# Patient Record
Sex: Female | Born: 1935 | Race: White | Hispanic: No | Smoking: Former smoker
Health system: Southern US, Community
[De-identification: ages and names within clinical notes are randomized; demographics above are authoritative.]

## PROBLEM LIST (undated history)

## (undated) DIAGNOSIS — M109 Gout, unspecified: Secondary | ICD-10-CM

## (undated) DIAGNOSIS — Z9981 Dependence on supplemental oxygen: Secondary | ICD-10-CM

## (undated) DIAGNOSIS — E871 Hypo-osmolality and hyponatremia: Secondary | ICD-10-CM

## (undated) DIAGNOSIS — G309 Alzheimer's disease, unspecified: Secondary | ICD-10-CM

## (undated) DIAGNOSIS — G459 Transient cerebral ischemic attack, unspecified: Secondary | ICD-10-CM

## (undated) DIAGNOSIS — I509 Heart failure, unspecified: Secondary | ICD-10-CM

## (undated) DIAGNOSIS — E119 Type 2 diabetes mellitus without complications: Secondary | ICD-10-CM

## (undated) DIAGNOSIS — I482 Chronic atrial fibrillation, unspecified: Secondary | ICD-10-CM

## (undated) DIAGNOSIS — F0390 Unspecified dementia without behavioral disturbance: Secondary | ICD-10-CM

## (undated) DIAGNOSIS — E7800 Pure hypercholesterolemia, unspecified: Secondary | ICD-10-CM

## (undated) DIAGNOSIS — D649 Anemia, unspecified: Secondary | ICD-10-CM

## (undated) DIAGNOSIS — Z8719 Personal history of other diseases of the digestive system: Secondary | ICD-10-CM

## (undated) DIAGNOSIS — Z8701 Personal history of pneumonia (recurrent): Secondary | ICD-10-CM

## (undated) DIAGNOSIS — I422 Other hypertrophic cardiomyopathy: Secondary | ICD-10-CM

## (undated) DIAGNOSIS — F329 Major depressive disorder, single episode, unspecified: Secondary | ICD-10-CM

## (undated) DIAGNOSIS — I10 Essential (primary) hypertension: Secondary | ICD-10-CM

## (undated) DIAGNOSIS — M533 Sacrococcygeal disorders, not elsewhere classified: Secondary | ICD-10-CM

## (undated) DIAGNOSIS — F419 Anxiety disorder, unspecified: Secondary | ICD-10-CM

## (undated) DIAGNOSIS — I4891 Unspecified atrial fibrillation: Secondary | ICD-10-CM

## (undated) DIAGNOSIS — I8390 Asymptomatic varicose veins of unspecified lower extremity: Secondary | ICD-10-CM

## (undated) DIAGNOSIS — J449 Chronic obstructive pulmonary disease, unspecified: Secondary | ICD-10-CM

## (undated) DIAGNOSIS — K219 Gastro-esophageal reflux disease without esophagitis: Secondary | ICD-10-CM

## (undated) DIAGNOSIS — I272 Pulmonary hypertension, unspecified: Secondary | ICD-10-CM

## (undated) DIAGNOSIS — F0280 Dementia in other diseases classified elsewhere without behavioral disturbance: Secondary | ICD-10-CM

## (undated) DIAGNOSIS — I69320 Aphasia following cerebral infarction: Secondary | ICD-10-CM

## (undated) DIAGNOSIS — M1990 Unspecified osteoarthritis, unspecified site: Secondary | ICD-10-CM

## (undated) DIAGNOSIS — J410 Simple chronic bronchitis: Secondary | ICD-10-CM

## (undated) DIAGNOSIS — Z950 Presence of cardiac pacemaker: Secondary | ICD-10-CM

## (undated) DIAGNOSIS — R296 Repeated falls: Secondary | ICD-10-CM

## (undated) DIAGNOSIS — F32A Depression, unspecified: Secondary | ICD-10-CM

## (undated) DIAGNOSIS — R079 Chest pain, unspecified: Secondary | ICD-10-CM

## (undated) DIAGNOSIS — F4323 Adjustment disorder with mixed anxiety and depressed mood: Secondary | ICD-10-CM

## (undated) HISTORY — DX: Repeated falls: R29.6

## (undated) HISTORY — DX: Chronic atrial fibrillation, unspecified: I48.20

## (undated) HISTORY — DX: Aphasia following cerebral infarction: I69.320

## (undated) HISTORY — DX: Unspecified atrial fibrillation: I48.91

## (undated) HISTORY — DX: Presence of cardiac pacemaker: Z95.0

## (undated) HISTORY — PX: FOOT NEUROMA SURGERY: SHX646

## (undated) HISTORY — PX: CARDIAC CATHETERIZATION: SHX172

## (undated) HISTORY — PX: BACK SURGERY: SHX140

## (undated) HISTORY — PX: TONSILLECTOMY: SUR1361

## (undated) HISTORY — DX: Essential (primary) hypertension: I10

## (undated) HISTORY — DX: Alzheimer's disease, unspecified: G30.9

## (undated) HISTORY — DX: Unspecified osteoarthritis, unspecified site: M19.90

## (undated) HISTORY — DX: Personal history of pneumonia (recurrent): Z87.01

## (undated) HISTORY — PX: APPENDECTOMY: SHX54

## (undated) HISTORY — DX: Dependence on supplemental oxygen: Z99.81

## (undated) HISTORY — DX: Pure hypercholesterolemia, unspecified: E78.00

## (undated) HISTORY — DX: Dementia in other diseases classified elsewhere without behavioral disturbance: F02.80

## (undated) HISTORY — PX: INSERT / REPLACE / REMOVE PACEMAKER: SUR710

## (undated) HISTORY — PX: DILATION AND CURETTAGE OF UTERUS: SHX78

## (undated) HISTORY — DX: Adjustment disorder with mixed anxiety and depressed mood: F43.23

## (undated) HISTORY — DX: Pulmonary hypertension, unspecified: I27.20

## (undated) HISTORY — DX: Other hypertrophic cardiomyopathy: I42.2

## (undated) HISTORY — DX: Type 2 diabetes mellitus without complications: E11.9

## (undated) HISTORY — DX: Chest pain, unspecified: R07.9

## (undated) HISTORY — PX: ABDOMINAL HYSTERECTOMY: SHX81

## (undated) HISTORY — DX: Simple chronic bronchitis: J41.0

---

## 1998-08-02 ENCOUNTER — Ambulatory Visit (HOSPITAL_COMMUNITY): Admission: RE | Admit: 1998-08-02 | Discharge: 1998-08-02 | Payer: Self-pay | Admitting: Nurse Practitioner

## 1999-12-18 ENCOUNTER — Encounter: Admission: RE | Admit: 1999-12-18 | Discharge: 1999-12-18 | Payer: Self-pay | Admitting: Gynecology

## 1999-12-18 ENCOUNTER — Encounter: Payer: Self-pay | Admitting: Gynecology

## 2000-07-21 ENCOUNTER — Encounter: Admission: RE | Admit: 2000-07-21 | Discharge: 2000-07-21 | Payer: Self-pay | Admitting: Family Medicine

## 2000-07-21 ENCOUNTER — Encounter: Payer: Self-pay | Admitting: Family Medicine

## 2000-12-08 ENCOUNTER — Other Ambulatory Visit: Admission: RE | Admit: 2000-12-08 | Discharge: 2000-12-08 | Payer: Self-pay | Admitting: Gynecology

## 2000-12-25 ENCOUNTER — Encounter: Admission: RE | Admit: 2000-12-25 | Discharge: 2000-12-25 | Payer: Self-pay | Admitting: Family Medicine

## 2000-12-25 ENCOUNTER — Encounter: Payer: Self-pay | Admitting: Family Medicine

## 2001-02-14 ENCOUNTER — Emergency Department (HOSPITAL_COMMUNITY): Admission: EM | Admit: 2001-02-14 | Discharge: 2001-02-14 | Payer: Self-pay | Admitting: Emergency Medicine

## 2001-02-14 ENCOUNTER — Encounter: Payer: Self-pay | Admitting: Emergency Medicine

## 2001-11-17 ENCOUNTER — Encounter: Payer: Self-pay | Admitting: Vascular Surgery

## 2001-11-18 ENCOUNTER — Ambulatory Visit (HOSPITAL_COMMUNITY): Admission: RE | Admit: 2001-11-18 | Discharge: 2001-11-18 | Payer: Self-pay | Admitting: Vascular Surgery

## 2001-12-02 ENCOUNTER — Ambulatory Visit (HOSPITAL_COMMUNITY): Admission: RE | Admit: 2001-12-02 | Discharge: 2001-12-02 | Payer: Self-pay | Admitting: Vascular Surgery

## 2001-12-08 ENCOUNTER — Ambulatory Visit (HOSPITAL_COMMUNITY): Admission: RE | Admit: 2001-12-08 | Discharge: 2001-12-08 | Payer: Self-pay | Admitting: Vascular Surgery

## 2002-02-15 ENCOUNTER — Ambulatory Visit (HOSPITAL_COMMUNITY): Admission: RE | Admit: 2002-02-15 | Discharge: 2002-02-15 | Payer: Self-pay | Admitting: Gastroenterology

## 2002-02-24 ENCOUNTER — Encounter: Payer: Self-pay | Admitting: Gynecology

## 2002-02-24 ENCOUNTER — Encounter: Admission: RE | Admit: 2002-02-24 | Discharge: 2002-02-24 | Payer: Self-pay | Admitting: Gynecology

## 2002-03-18 ENCOUNTER — Encounter: Payer: Self-pay | Admitting: Gastroenterology

## 2002-03-18 ENCOUNTER — Encounter: Admission: RE | Admit: 2002-03-18 | Discharge: 2002-03-18 | Payer: Self-pay | Admitting: Gastroenterology

## 2002-05-12 ENCOUNTER — Encounter: Admission: RE | Admit: 2002-05-12 | Discharge: 2002-05-12 | Payer: Self-pay | Admitting: Family Medicine

## 2002-05-12 ENCOUNTER — Encounter: Payer: Self-pay | Admitting: Family Medicine

## 2002-07-06 ENCOUNTER — Encounter: Admission: RE | Admit: 2002-07-06 | Discharge: 2002-07-06 | Payer: Self-pay | Admitting: Orthopedic Surgery

## 2002-07-06 ENCOUNTER — Encounter: Payer: Self-pay | Admitting: Orthopedic Surgery

## 2002-10-05 ENCOUNTER — Encounter: Payer: Self-pay | Admitting: Family Medicine

## 2002-10-05 ENCOUNTER — Encounter: Admission: RE | Admit: 2002-10-05 | Discharge: 2002-10-05 | Payer: Self-pay | Admitting: Family Medicine

## 2003-02-15 ENCOUNTER — Encounter (INDEPENDENT_AMBULATORY_CARE_PROVIDER_SITE_OTHER): Payer: Self-pay | Admitting: Specialist

## 2003-02-15 ENCOUNTER — Ambulatory Visit (HOSPITAL_COMMUNITY): Admission: RE | Admit: 2003-02-15 | Discharge: 2003-02-15 | Payer: Self-pay | Admitting: Gastroenterology

## 2003-03-02 ENCOUNTER — Encounter: Payer: Self-pay | Admitting: Gynecology

## 2003-03-02 ENCOUNTER — Other Ambulatory Visit: Admission: RE | Admit: 2003-03-02 | Discharge: 2003-03-02 | Payer: Self-pay | Admitting: Gynecology

## 2003-03-02 ENCOUNTER — Encounter: Admission: RE | Admit: 2003-03-02 | Discharge: 2003-03-02 | Payer: Self-pay | Admitting: Gynecology

## 2003-07-17 ENCOUNTER — Emergency Department (HOSPITAL_COMMUNITY): Admission: EM | Admit: 2003-07-17 | Discharge: 2003-07-17 | Payer: Self-pay | Admitting: Emergency Medicine

## 2003-07-17 ENCOUNTER — Encounter: Payer: Self-pay | Admitting: Emergency Medicine

## 2003-08-10 ENCOUNTER — Encounter: Payer: Self-pay | Admitting: Thoracic Surgery

## 2003-08-11 ENCOUNTER — Encounter (INDEPENDENT_AMBULATORY_CARE_PROVIDER_SITE_OTHER): Payer: Self-pay | Admitting: *Deleted

## 2003-08-11 ENCOUNTER — Ambulatory Visit (HOSPITAL_COMMUNITY): Admission: RE | Admit: 2003-08-11 | Discharge: 2003-08-11 | Payer: Self-pay | Admitting: Thoracic Surgery

## 2003-08-25 ENCOUNTER — Inpatient Hospital Stay (HOSPITAL_COMMUNITY): Admission: EM | Admit: 2003-08-25 | Discharge: 2003-08-28 | Payer: Self-pay | Admitting: Emergency Medicine

## 2003-08-25 ENCOUNTER — Encounter: Payer: Self-pay | Admitting: Emergency Medicine

## 2003-08-26 ENCOUNTER — Encounter: Payer: Self-pay | Admitting: Neurology

## 2003-09-07 ENCOUNTER — Encounter: Admission: RE | Admit: 2003-09-07 | Discharge: 2003-09-07 | Payer: Self-pay | Admitting: Thoracic Surgery

## 2003-10-07 ENCOUNTER — Emergency Department (HOSPITAL_COMMUNITY): Admission: EM | Admit: 2003-10-07 | Discharge: 2003-10-08 | Payer: Self-pay | Admitting: Emergency Medicine

## 2003-10-20 ENCOUNTER — Encounter: Admission: RE | Admit: 2003-10-20 | Discharge: 2003-10-20 | Payer: Self-pay | Admitting: Thoracic Surgery

## 2004-03-05 ENCOUNTER — Encounter: Admission: RE | Admit: 2004-03-05 | Discharge: 2004-03-05 | Payer: Self-pay | Admitting: Gynecology

## 2004-03-05 ENCOUNTER — Other Ambulatory Visit: Admission: RE | Admit: 2004-03-05 | Discharge: 2004-03-05 | Payer: Self-pay | Admitting: Gynecology

## 2004-04-11 ENCOUNTER — Encounter
Admission: RE | Admit: 2004-04-11 | Discharge: 2004-04-11 | Payer: Self-pay | Admitting: Physical Medicine and Rehabilitation

## 2004-12-07 ENCOUNTER — Encounter: Admission: RE | Admit: 2004-12-07 | Discharge: 2004-12-07 | Payer: Self-pay | Admitting: Family Medicine

## 2005-01-02 ENCOUNTER — Ambulatory Visit (HOSPITAL_COMMUNITY): Admission: RE | Admit: 2005-01-02 | Discharge: 2005-01-02 | Payer: Self-pay | Admitting: Cardiology

## 2005-02-22 IMAGING — CT CT ANGIO HEAD
1 series · 19 of 30 positions shown · IV contrast ([ID] OMNI 300)
Comparison: 08/25/03

FINDINGS
CLINICAL DATA: 67-YEAR-OLD FEMALE. VERTIGO. WEAKNESS.
CTA OF THE HEAD
TECHNIQUE: AXIAL IMAGING WAS PERFORMED OF THE BRAIN FOLLOWING ADMINISTRATION OF IV CONTRAST
UTILIZING A CTA PROTOCOL OF THE CIRCLE OF WILLIS. CORONAL AND SAGITTAL REFORMATS WERE INCLUDED ON
THE WORKSTATION.

[Series 4: 4cc sec (id) · axial · 0.49mm/px · z∈[+113,+275]mm · 19 of 347 slices shown]
[im 12/347  brain]
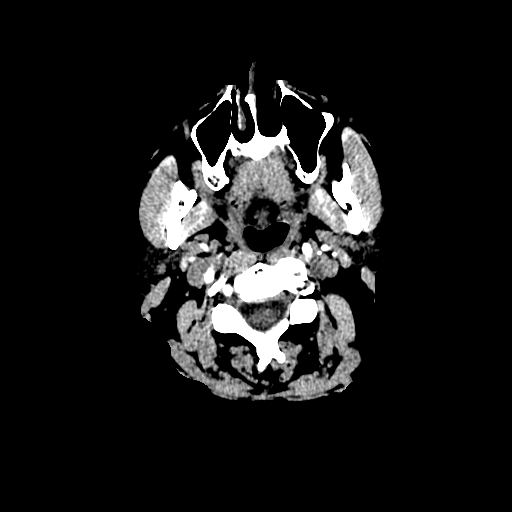
[im 36/347  bone]
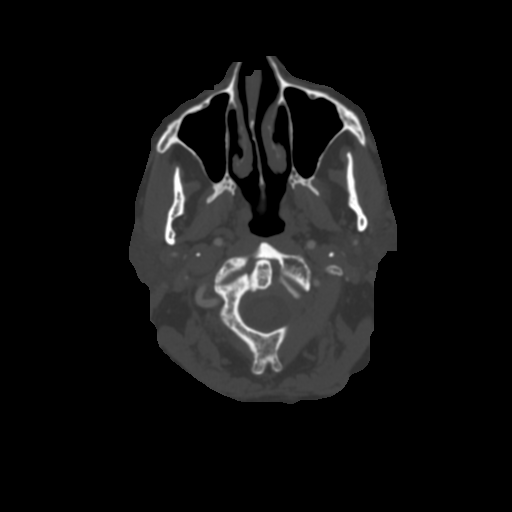
[im 48/347  brain]
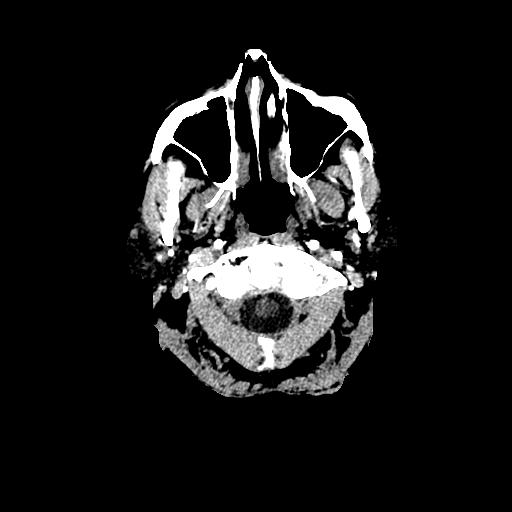
[im 72/347  bone]
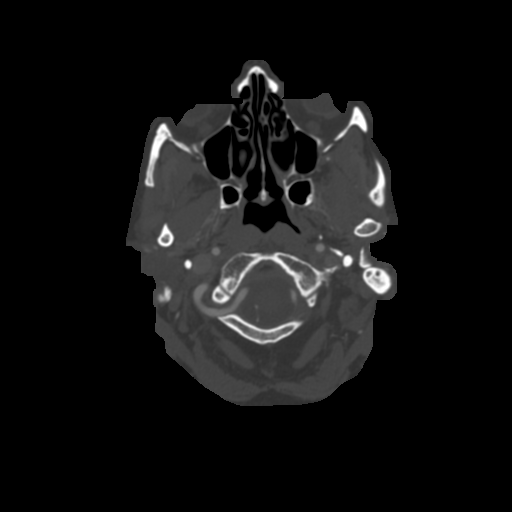
[im 84/347  brain]
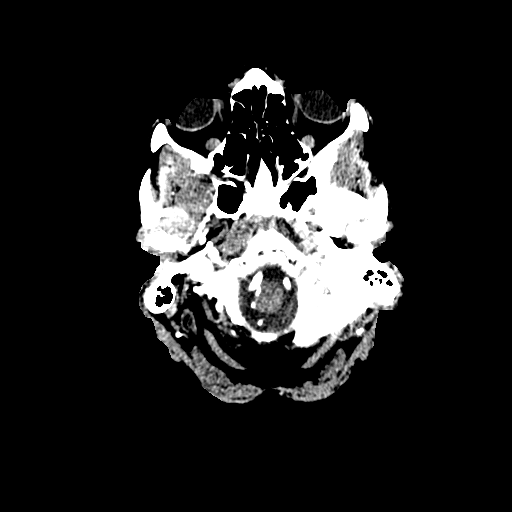
[im 108/347  bone]
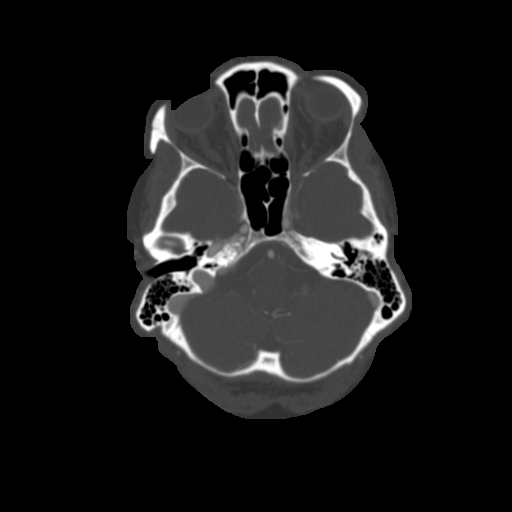
[im 120/347  brain]
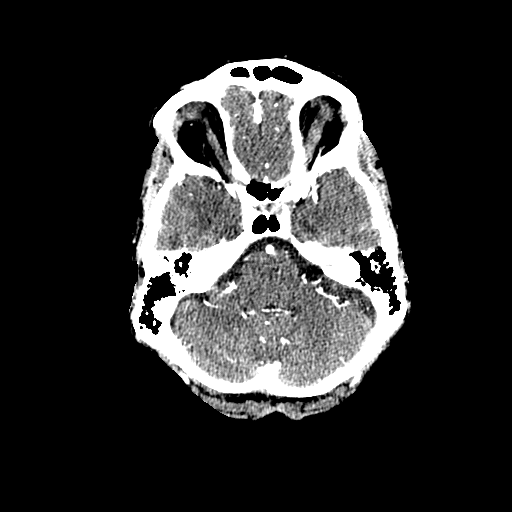
[im 144/347  bone]
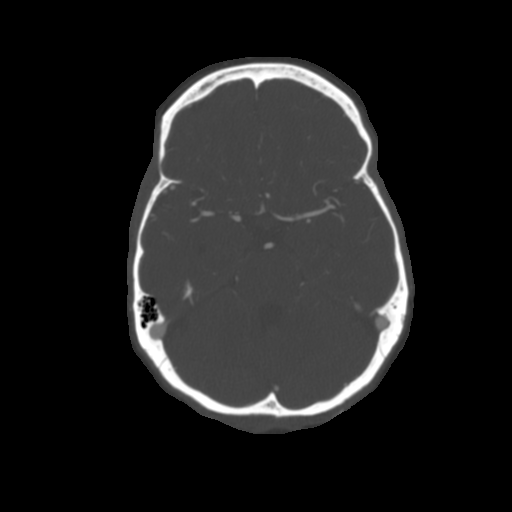
[im 156/347  brain]
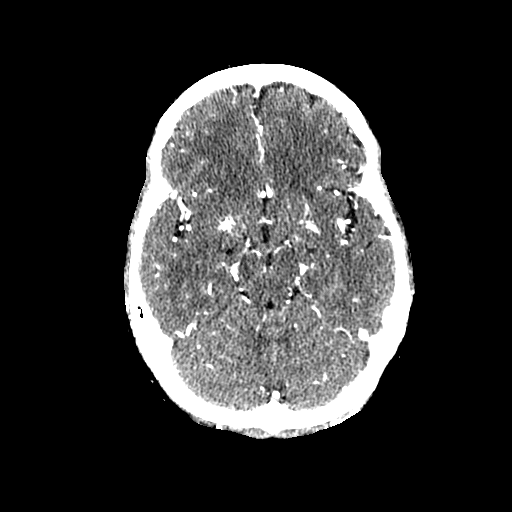
[im 179/347  bone]
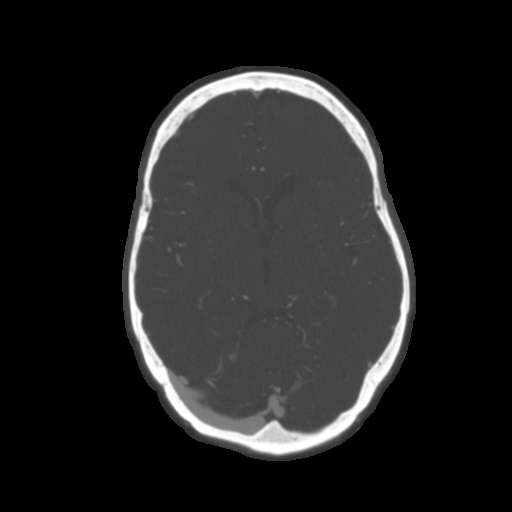
[im 191/347  brain]
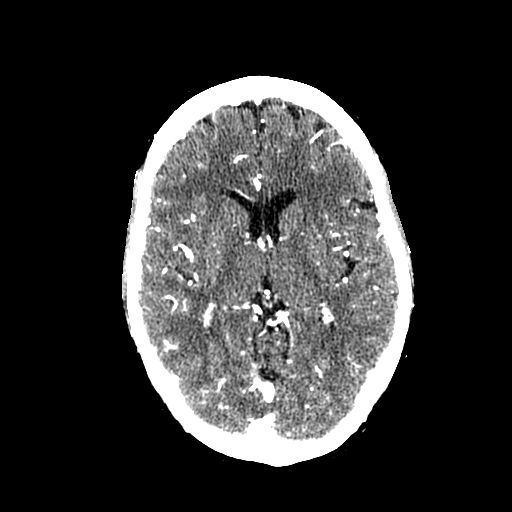
[im 203/347  bone]
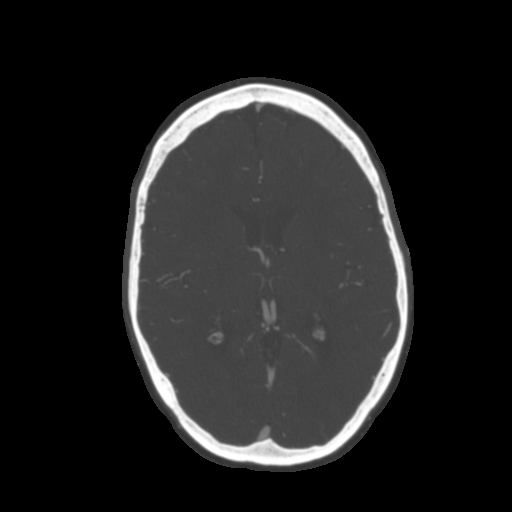
[im 227/347  brain]
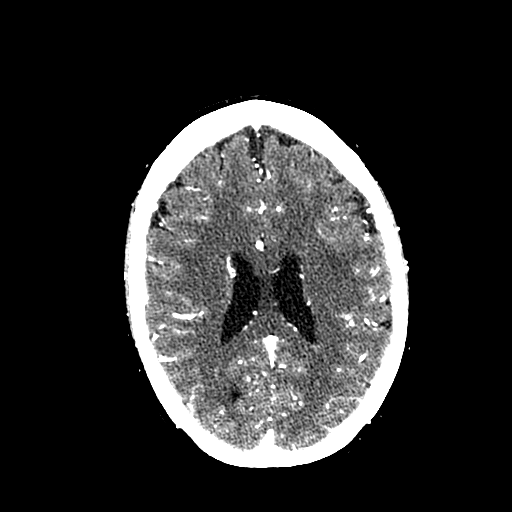
[im 239/347  bone]
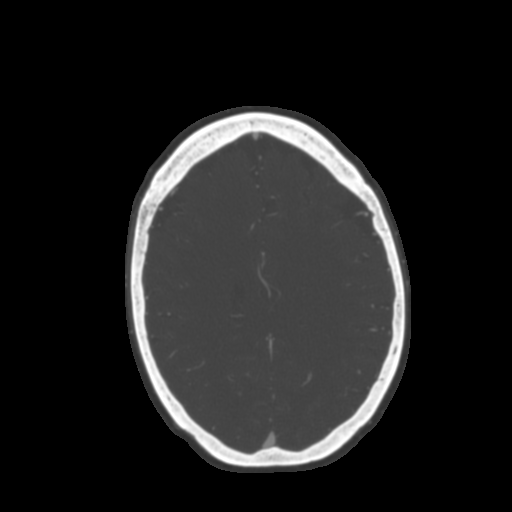
[im 263/347  brain]
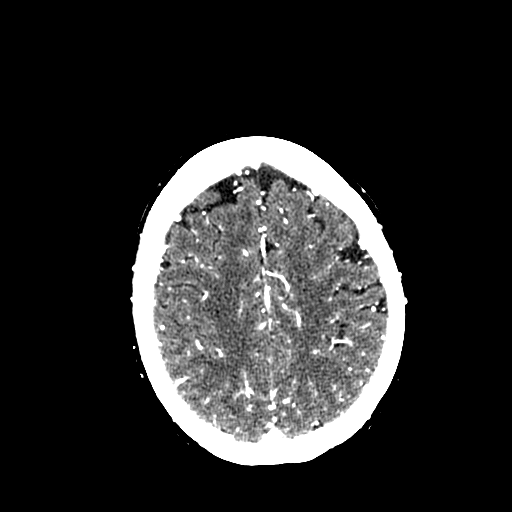
[im 275/347  bone]
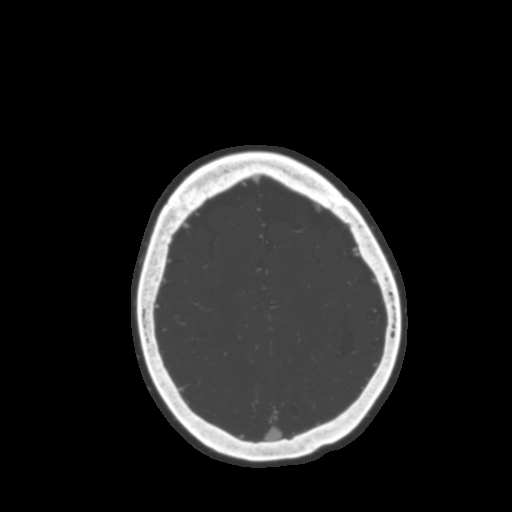
[im 299/347  brain]
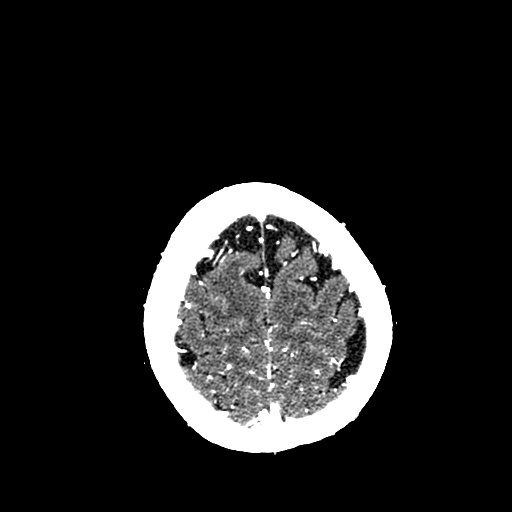
[im 311/347  bone]
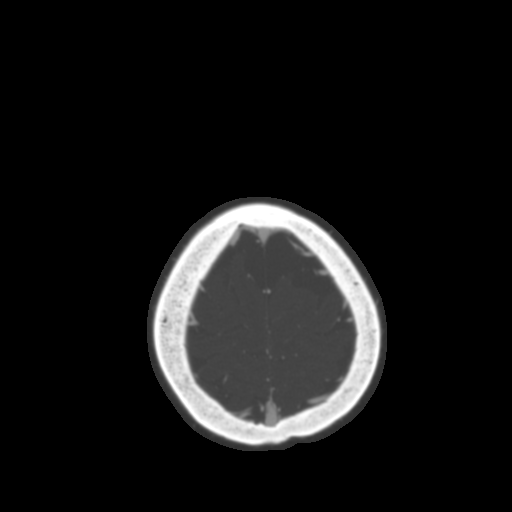
[im 335/347  brain]
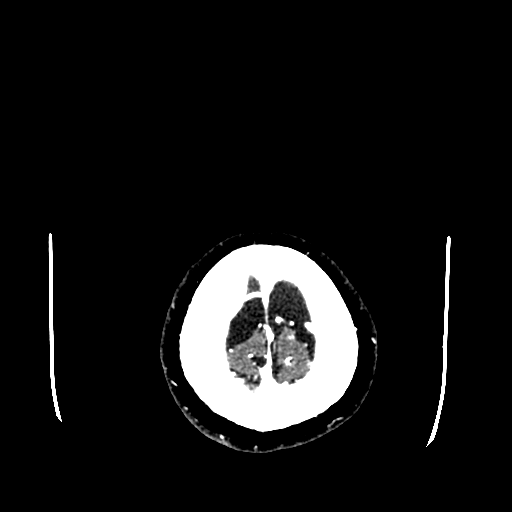

[19 of 30 positions shown; findings below may reference images not displayed]

FINDINGS: THE ANTERIOR CIRCULATION DEMONSTRATES PATENT INTRACRANIAL INTERNAL CAROTID ARTERIES.
THERE IS MILD ATHEROSCLEROSIS OF THE CAVERNOUS CAROTID ARTERIES BILATERALLY. THE MIDDLE AND
ANTERIOR CEREBRAL ARTERIES ARE PATENT. THE ANTERIOR COMMUNICATING ARTERY IS FAINTLY VISIBLE AND
PATENT. THERE IS NO EVIDENCE OF ANEURYSM OR VASCULAR OCCLUSION INVOLVING THE ANTERIOR CIRCULATION.
THE VERTEBRAL ARTERIES VISUALIZED IN THE SKULL BASE REGION ARE PATENT, SUPPLYING THE BASILAR ARTERY.
THE BASILAR TIP DEMONSTRATES PATENT SUPERIOR CEREBELLAR ARTERIES AND POSTERIOR CEREBRAL ARTERIES.
OFF OF THE VERTEBRAL ARTERIES, THE POSTERIOR INFERIOR CEREBELLAR ARTERIES ARE ALSO FAINTLY VISIBLE.
AGAIN, THERE IS NO EVIDENCE OF VASCULAR OCCLUSION OR ANEURYSM INVOLVING THE POSTERIOR CIRCULATION.
THE OPACIFIED DURAL SINUSES ARE PATENT.
IMPRESSION
1. PATENT ANTERIOR AND POSTERIOR CIRCULATIONS. DURAL VENOUS SINUSES ARE ALSO PATENT.
2. THERE IS NO EVIDENCE OF VASCULAR OCCLUSION, THROMBUS, OR ANEURYSM.

## 2005-02-26 ENCOUNTER — Encounter: Admission: RE | Admit: 2005-02-26 | Discharge: 2005-02-26 | Payer: Self-pay | Admitting: Orthopaedic Surgery

## 2005-02-28 ENCOUNTER — Encounter: Admission: RE | Admit: 2005-02-28 | Discharge: 2005-02-28 | Payer: Self-pay | Admitting: Orthopaedic Surgery

## 2005-04-04 ENCOUNTER — Ambulatory Visit (HOSPITAL_COMMUNITY): Admission: RE | Admit: 2005-04-04 | Discharge: 2005-04-05 | Payer: Self-pay | Admitting: Orthopaedic Surgery

## 2005-06-11 ENCOUNTER — Encounter: Admission: RE | Admit: 2005-06-11 | Discharge: 2005-06-11 | Payer: Self-pay | Admitting: Orthopaedic Surgery

## 2005-08-23 ENCOUNTER — Encounter: Admission: RE | Admit: 2005-08-23 | Discharge: 2005-08-23 | Payer: Self-pay | Admitting: Orthopaedic Surgery

## 2005-10-25 ENCOUNTER — Ambulatory Visit (HOSPITAL_COMMUNITY): Admission: RE | Admit: 2005-10-25 | Discharge: 2005-10-25 | Payer: Self-pay | Admitting: Cardiology

## 2005-11-18 ENCOUNTER — Encounter: Admission: RE | Admit: 2005-11-18 | Discharge: 2005-11-18 | Payer: Self-pay | Admitting: Gastroenterology

## 2005-12-18 ENCOUNTER — Encounter: Admission: RE | Admit: 2005-12-18 | Discharge: 2005-12-18 | Payer: Self-pay | Admitting: Gastroenterology

## 2006-01-01 ENCOUNTER — Ambulatory Visit: Payer: Self-pay | Admitting: Pain Medicine

## 2006-01-13 ENCOUNTER — Encounter
Admission: RE | Admit: 2006-01-13 | Discharge: 2006-01-13 | Payer: Self-pay | Admitting: Physical Medicine and Rehabilitation

## 2006-01-21 ENCOUNTER — Ambulatory Visit: Payer: Self-pay | Admitting: Pain Medicine

## 2006-02-05 ENCOUNTER — Ambulatory Visit: Payer: Self-pay | Admitting: Pain Medicine

## 2006-02-20 ENCOUNTER — Ambulatory Visit: Payer: Self-pay | Admitting: Pain Medicine

## 2006-03-08 ENCOUNTER — Emergency Department (HOSPITAL_COMMUNITY): Admission: EM | Admit: 2006-03-08 | Discharge: 2006-03-08 | Payer: Self-pay | Admitting: Family Medicine

## 2006-04-01 ENCOUNTER — Ambulatory Visit: Payer: Self-pay | Admitting: Pain Medicine

## 2006-04-22 ENCOUNTER — Ambulatory Visit: Payer: Self-pay | Admitting: Physician Assistant

## 2006-04-30 ENCOUNTER — Emergency Department (HOSPITAL_COMMUNITY): Admission: EM | Admit: 2006-04-30 | Discharge: 2006-05-01 | Payer: Self-pay | Admitting: Emergency Medicine

## 2006-06-06 IMAGING — CR DG CHEST 2V
2 series · 2 of 2 positions shown · non-contrast
Comparison: none

CLINICAL DATA: Cough.  Congestion.  Fever.
 DIAGNOSTIC CHEST - TWO VIEW:

[view not recorded (1 of 2)]
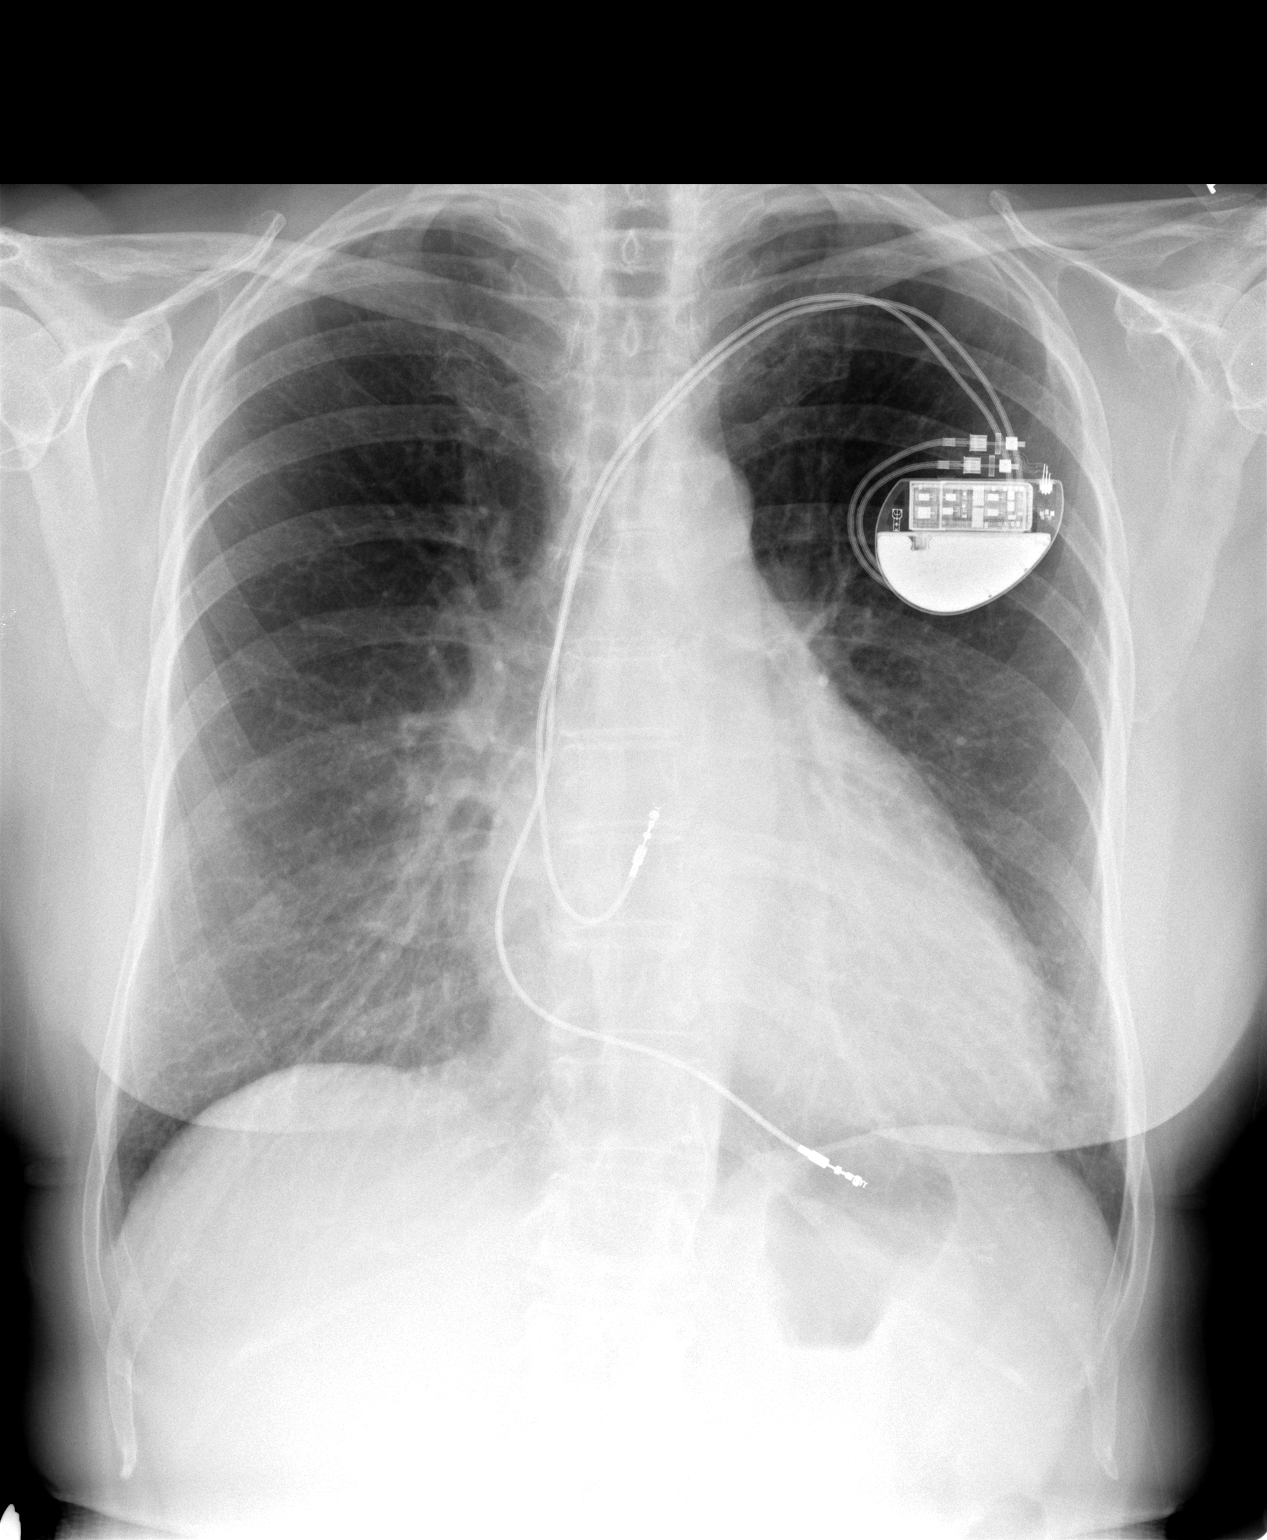

[view not recorded (2 of 2)]
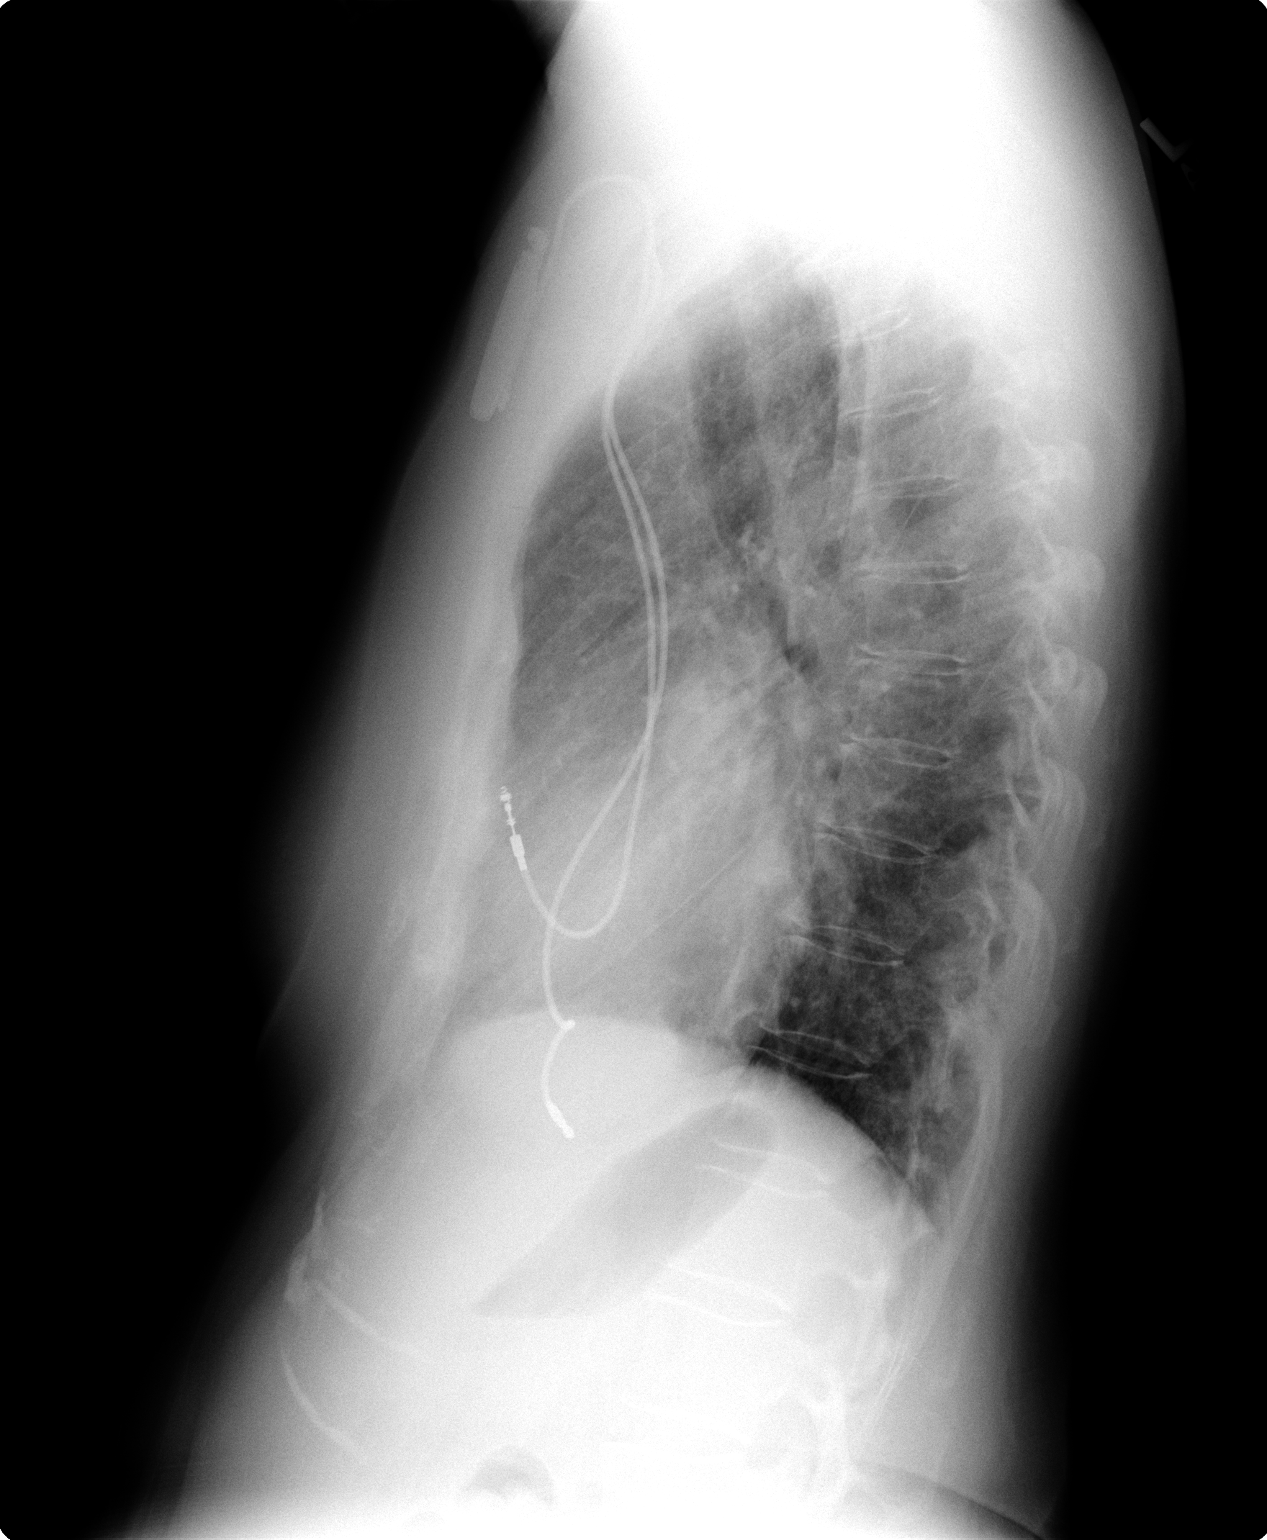

[2 of 2 positions shown; findings below may reference images not displayed]

FINDINGS: Since 10/20/03 there is no interval change in dual lead left permanent cardiac pacemaker with leads ending in the region of the right atrium and ventricle with stable cardiomegaly.  The lungs are currently clear.  Mediastinum, hila, pleura, and osseous structures are stable.
IMPRESSION: Since 10/20/03 no interval change:
 1.  Stable cardiomegaly. 
 2.  No active disease.

## 2006-08-09 ENCOUNTER — Ambulatory Visit: Payer: Self-pay | Admitting: Psychiatry

## 2006-08-09 ENCOUNTER — Inpatient Hospital Stay (HOSPITAL_COMMUNITY): Admission: EM | Admit: 2006-08-09 | Discharge: 2006-08-10 | Payer: Self-pay | Admitting: Psychiatry

## 2006-08-11 ENCOUNTER — Other Ambulatory Visit (HOSPITAL_COMMUNITY): Admission: RE | Admit: 2006-08-11 | Discharge: 2006-11-09 | Payer: Self-pay | Admitting: Psychiatry

## 2006-08-26 IMAGING — RF DG MYELOGRAM LUMBAR
14 series · 14 of 14 positions shown · IV contrast (omnipaque)
Comparison: 04/11/04.

CLINICAL DATA: Back and left leg pain.  
LUMBAR MYELOGRAM:
Following informed consent, sterile preparation of the back, and adequate local anesthesia, a lumbar puncture was performed using a 22 gauge spinal needle at L2-3, from a left paramedian approach.  Fluid was clear and colorless.  15 cc of Omnipaque 180 was instilled in the subarachnoid space.  AP, lateral, and oblique views demonstrate mild ventral defects at L4-5 and L5-S1.  There is moderate narrowing of the interspace at L5-S1.  There is mild effacement of the right L-5 nerve root in the lateral recess.

[Series 1: myelogram  white · 1 of 1 slices shown (1 of 14)]
[im 1/1]
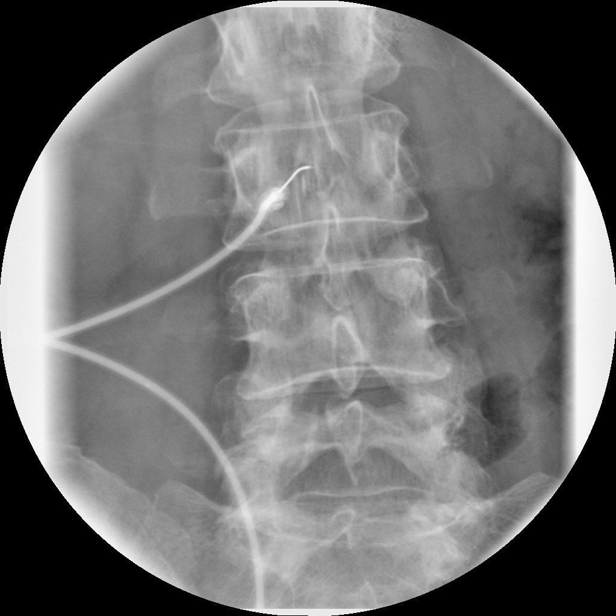

[Series 2: myelogram  white · 1 of 1 slices shown (2 of 14)]
[im 1/1]
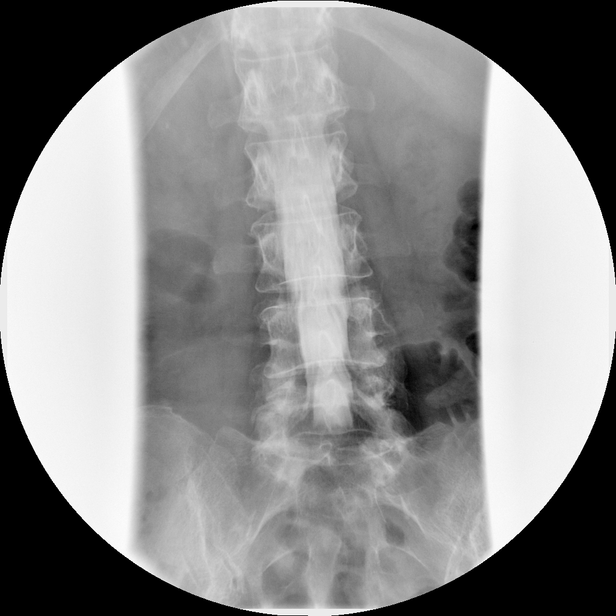

[Series 3: myelogram  white · 1 of 1 slices shown (3 of 14)]
[im 1/1]
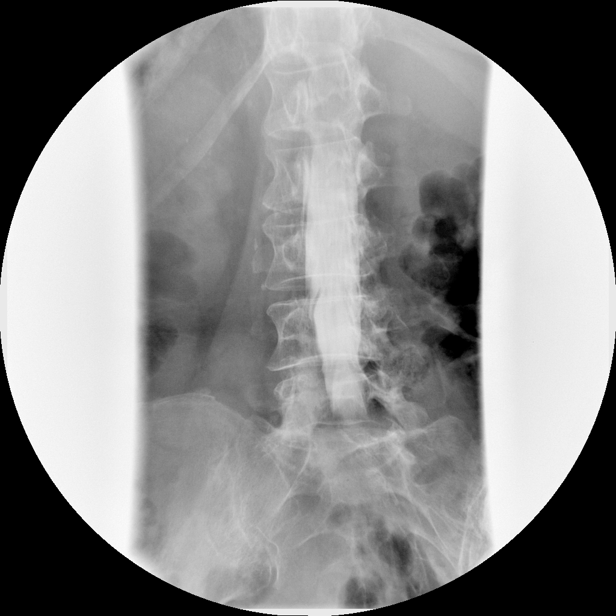

[Series 4: myelogram  white · 1 of 1 slices shown (4 of 14)]
[im 1/1]
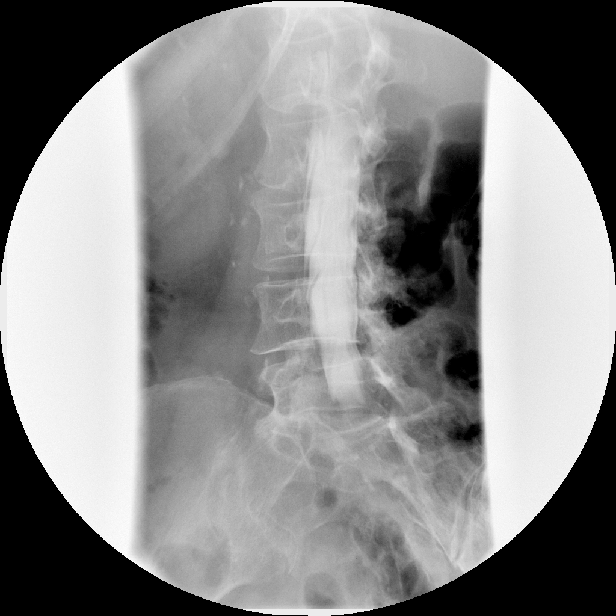

[Series 5: myelogram  white · 1 of 1 slices shown (5 of 14)]
[im 1/1]
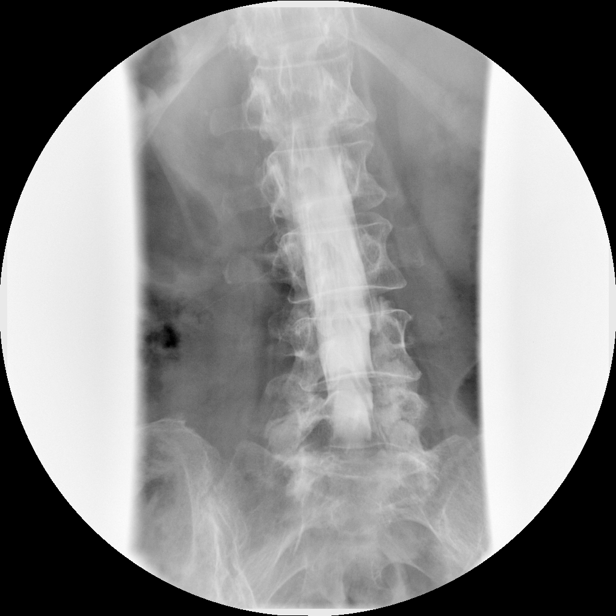

[Series 6: myelogram  white · 1 of 1 slices shown (6 of 14)]
[im 1/1]
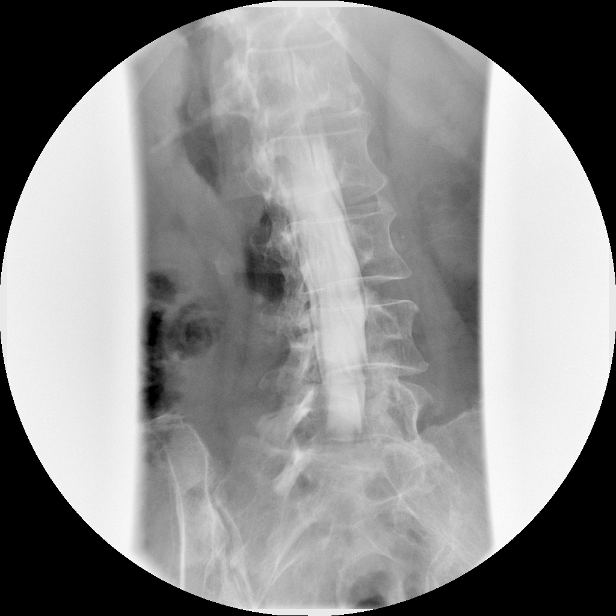

[Series 7: myelogram  white · 1 of 1 slices shown (7 of 14)]
[im 1/1]
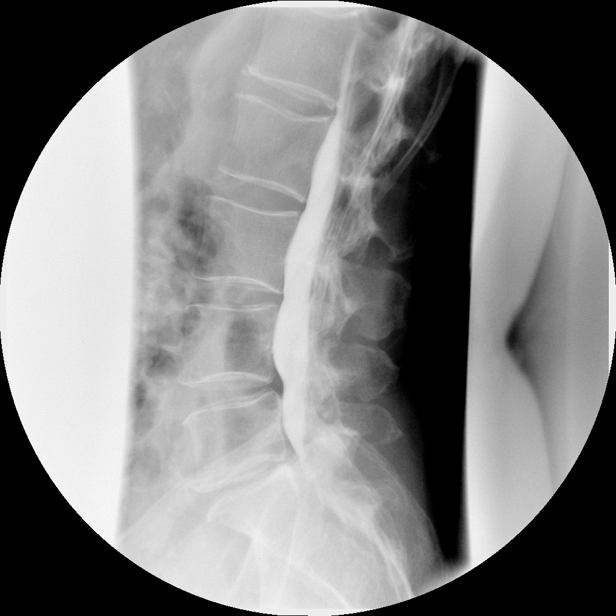

[Series 8: myelogram  white · 1 of 1 slices shown (8 of 14)]
[im 1/1]
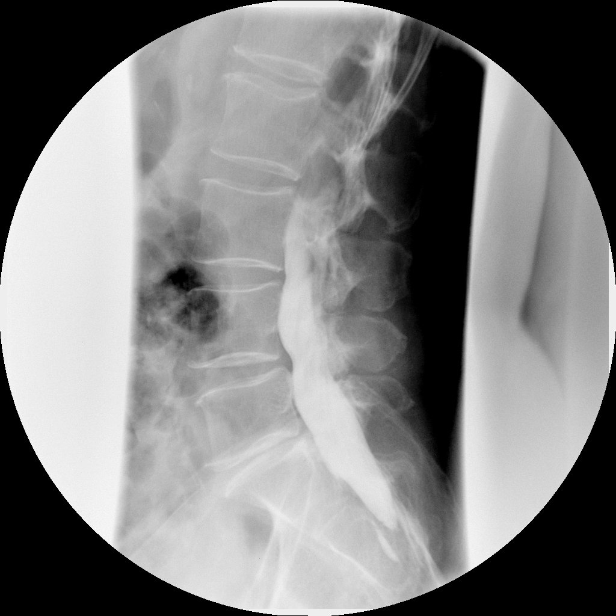

[Series 9: myelogram  white · 1 of 1 slices shown (9 of 14)]
[im 1/1]
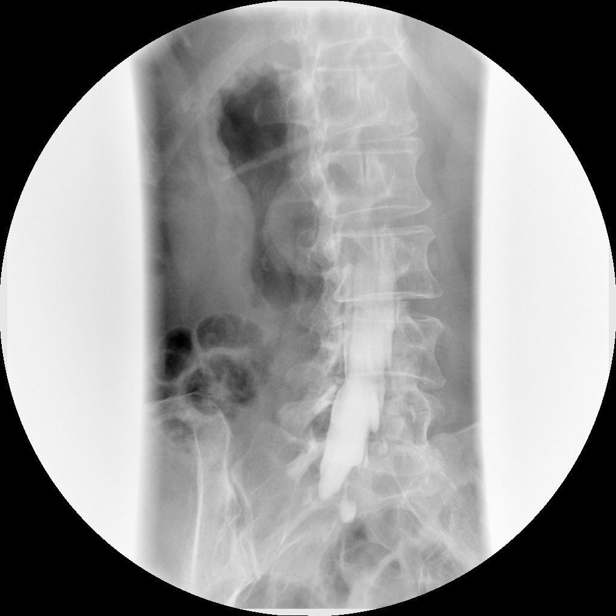

[Series 10: myelogram  white · 1 of 1 slices shown (10 of 14)]
[im 1/1]
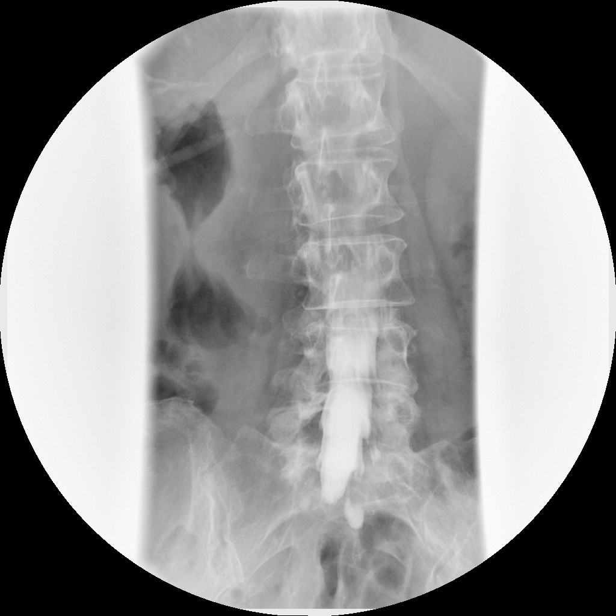

[Series 11: myelogram  white · 1 of 1 slices shown (11 of 14)]
[im 1/1]
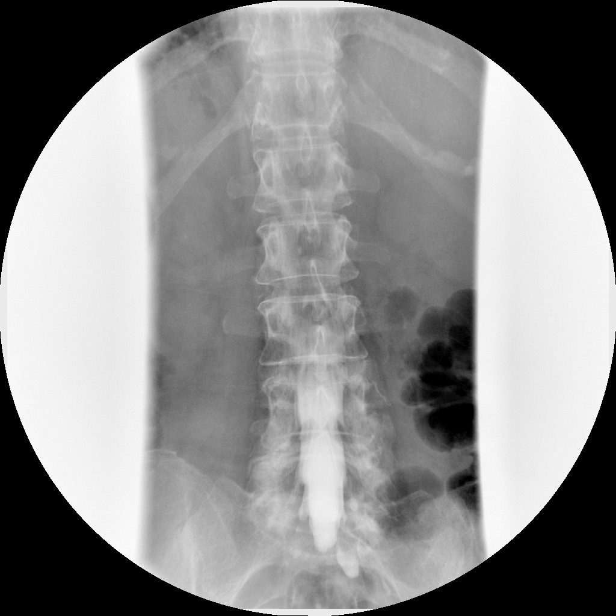

[Series 12: myelogram  white · 1 of 1 slices shown (12 of 14)]
[im 1/1]
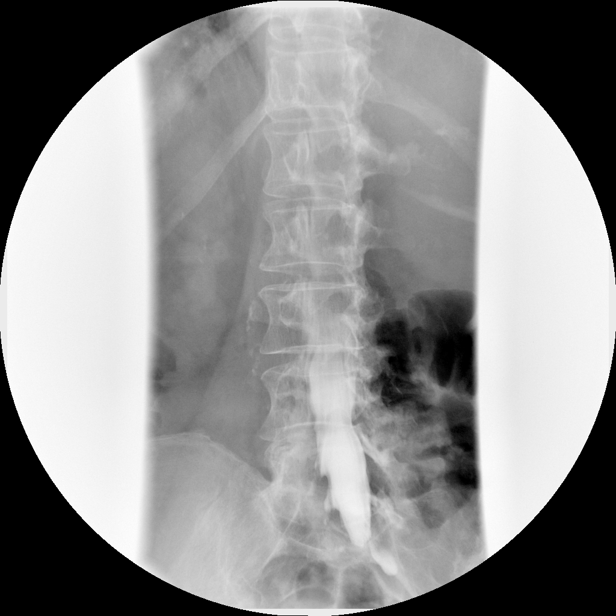

[Series 13: myelogram  white · 1 of 1 slices shown (13 of 14)]
[im 1/1]
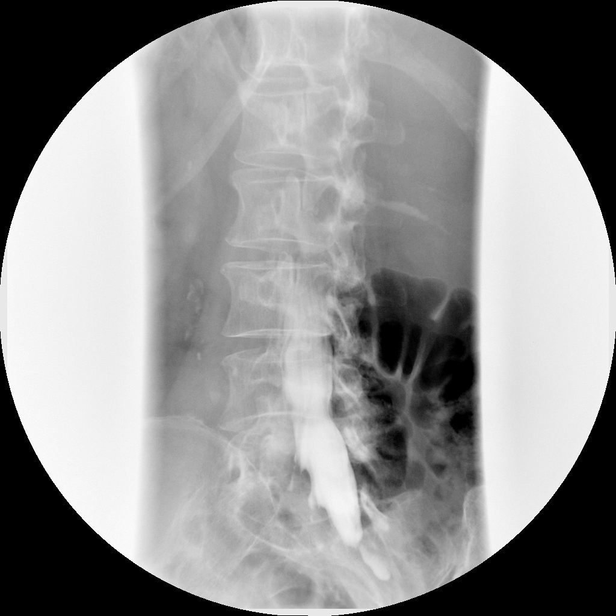

[Series 14: myelogram  white · 1 of 1 slices shown (14 of 14)]
[im 1/1]
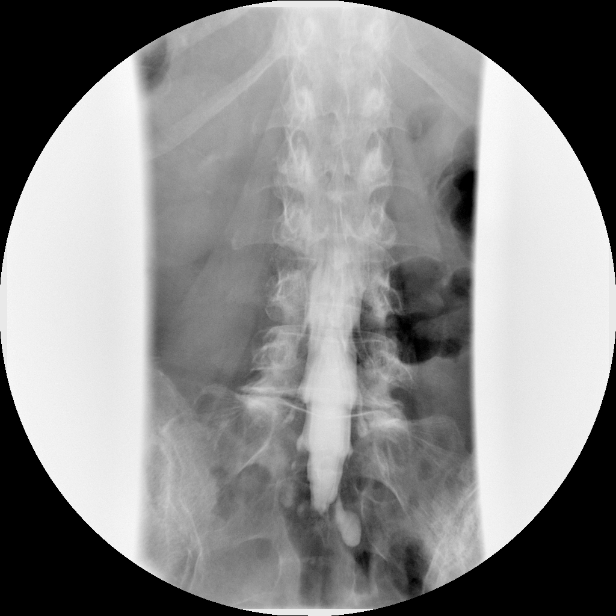

[14 of 14 positions shown; findings below may reference images not displayed]

IMPRESSION: See above report.
POST-MYELOGRAM CT:
L1-2:  Normal interspace.
L2-3:  Normal interspace.
L3-4:  Mild facet arthropathy is noted with vascular calcification.   No stenosis or disc protrusion. 
L4-5:  Broad based disc protrusion centrally associated with moderate posterior element hypertrophy affecting facets and ligamentum flavum.  Right L-5 nerve root encroachment is seen in the lateral recess.
L5-S1:  Small central protrusion.  No S-1 nerve root encroachment.  Note is made of bony foraminal narrowing on the left with a partially calcified foraminal protrusion.  This effaces the left L-5 root.  Compared with the previous study the bony foraminal narrowing at L5-S1 appears worse.  Given her clinical left L-5 radiculopathy this would appear to be likely worse.
IMPRESSION: 1.  Small central protrusion L4-5 with mild right L-5 nerve root encroachment.
2.  Calcified disc material/osteophyte L5-S1, left in the foramen associated with disc space narrowing; left L-5 nerve root encroachment is appreciated.
3.  The findings appear slightly worse compared with 04/11/04.  Correlate with the patient?s left L-5 radiculopathy.

## 2006-08-26 IMAGING — CT CT L SPINE W/ CM
2 of 10 series · 6 of 20 positions shown, 8 images · IV contrast (omnipaque)
Comparison: 04/11/04.

CLINICAL DATA: Back and left leg pain.  
LUMBAR MYELOGRAM:
Following informed consent, sterile preparation of the back, and adequate local anesthesia, a lumbar puncture was performed using a 22 gauge spinal needle at L2-3, from a left paramedian approach.  Fluid was clear and colorless.  15 cc of Omnipaque 180 was instilled in the subarachnoid space.  AP, lateral, and oblique views demonstrate mild ventral defects at L4-5 and L5-S1.  There is moderate narrowing of the interspace at L5-S1.  There is mild effacement of the right L-5 nerve root in the lateral recess.

[Series 4: recon 3: l spine · axial · 0.27mm/px · z∈[-177,-44]mm · 5 of 334 slices shown, 7 images]
[im 56/334  soft-tissue]
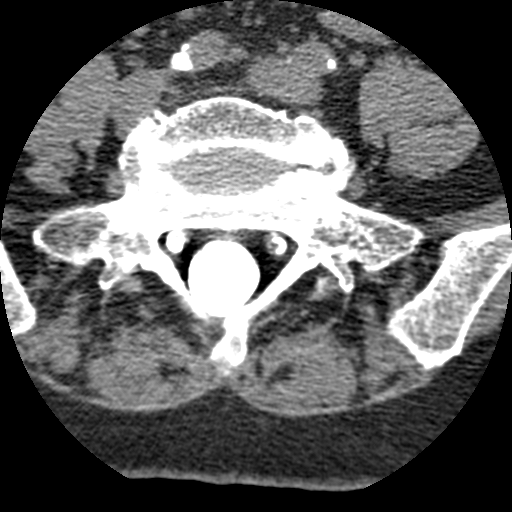
[im 56/334  bone]
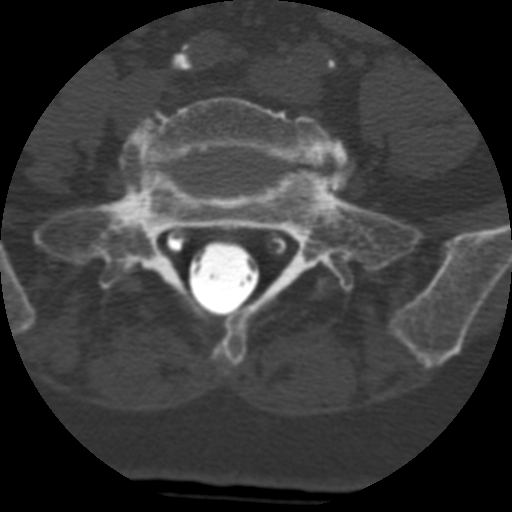
[im 112/334  bone]
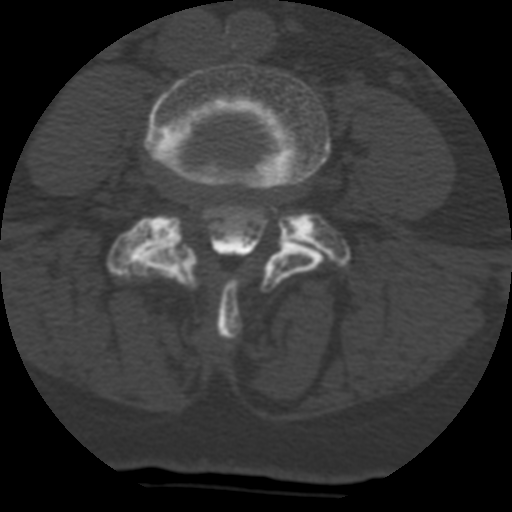
[im 167/334  bone]
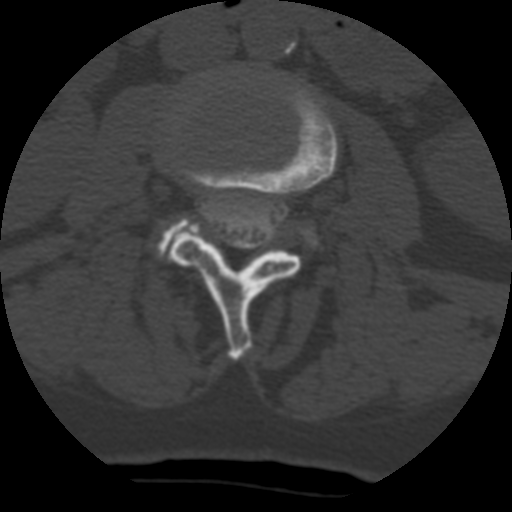
[im 223/334  bone]
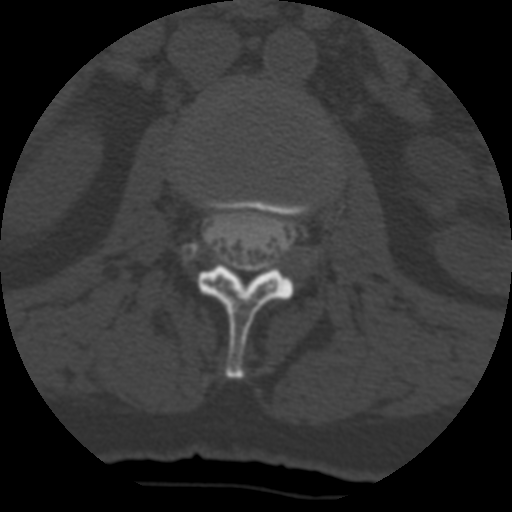
[im 278/334  soft-tissue]
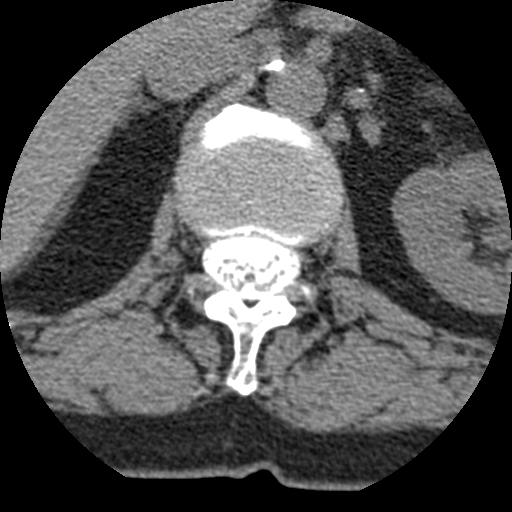
[im 278/334  bone]
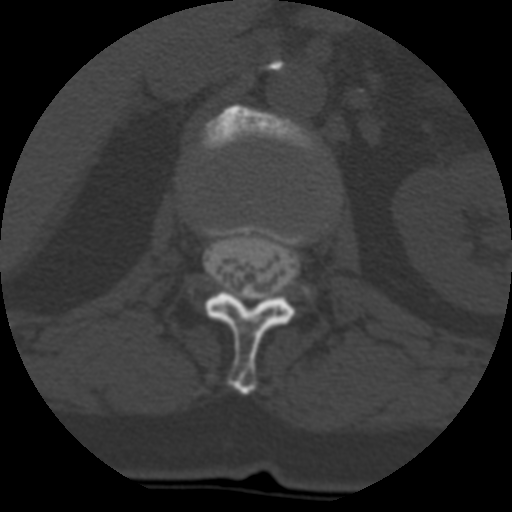

[Series 400: reformatted · sagittal · 0.40mm/px · 1 of 40 slices shown]
[im 20/40  bone]
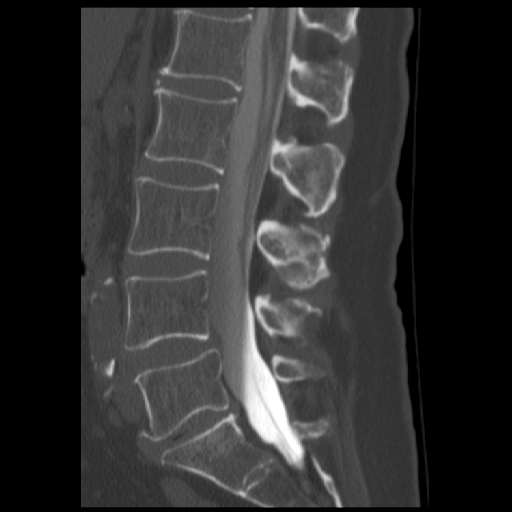

[6 of 20 positions shown; findings below may reference images not displayed]

IMPRESSION: See above report.
POST-MYELOGRAM CT:
L1-2:  Normal interspace.
L2-3:  Normal interspace.
L3-4:  Mild facet arthropathy is noted with vascular calcification.   No stenosis or disc protrusion. 
L4-5:  Broad based disc protrusion centrally associated with moderate posterior element hypertrophy affecting facets and ligamentum flavum.  Right L-5 nerve root encroachment is seen in the lateral recess.
L5-S1:  Small central protrusion.  No S-1 nerve root encroachment.  Note is made of bony foraminal narrowing on the left with a partially calcified foraminal protrusion.  This effaces the left L-5 root.  Compared with the previous study the bony foraminal narrowing at L5-S1 appears worse.  Given her clinical left L-5 radiculopathy this would appear to be likely worse.
IMPRESSION: 1.  Small central protrusion L4-5 with mild right L-5 nerve root encroachment.
2.  Calcified disc material/osteophyte L5-S1, left in the foramen associated with disc space narrowing; left L-5 nerve root encroachment is appreciated.
3.  The findings appear slightly worse compared with 04/11/04.  Correlate with the patient?s left L-5 radiculopathy.

## 2006-10-21 ENCOUNTER — Encounter: Admission: RE | Admit: 2006-10-21 | Discharge: 2006-10-21 | Payer: Self-pay | Admitting: Family Medicine

## 2006-10-28 ENCOUNTER — Other Ambulatory Visit: Admission: RE | Admit: 2006-10-28 | Discharge: 2006-10-28 | Payer: Self-pay | Admitting: Gynecology

## 2006-12-09 IMAGING — CR DG LUMBAR SPINE 2-3V
3 series · 3 of 3 positions shown · non-contrast
Comparison: none

CLINICAL DATA: Low back pain radiating into the buttocks.
 BONE SCAN:
 Patient was injected with 25.7 mCi of Kechnetium-QQm MDP intravenously, and imaging of the lumbar spine and pelvis was performed.  There is a focus of increased activity on the right at the L4-5 level.  Plain films of the lumbar spine will be obtained. The remainder of activity throughout the skeleton is normal.

[t l-spine a.p.]
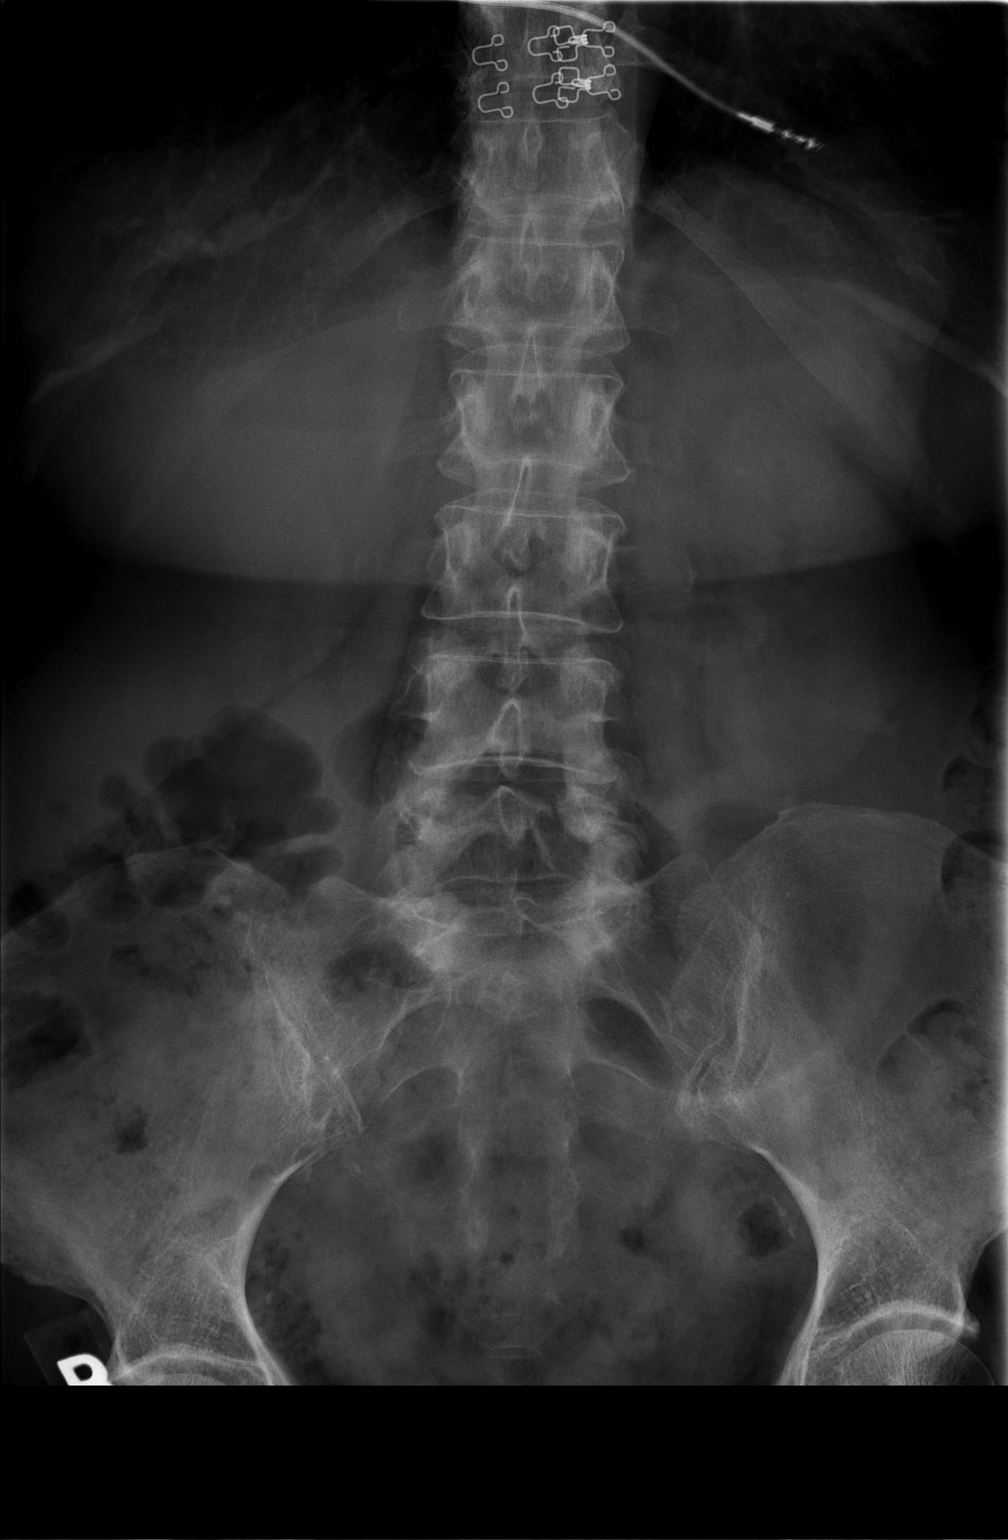

[t l-spine lat]
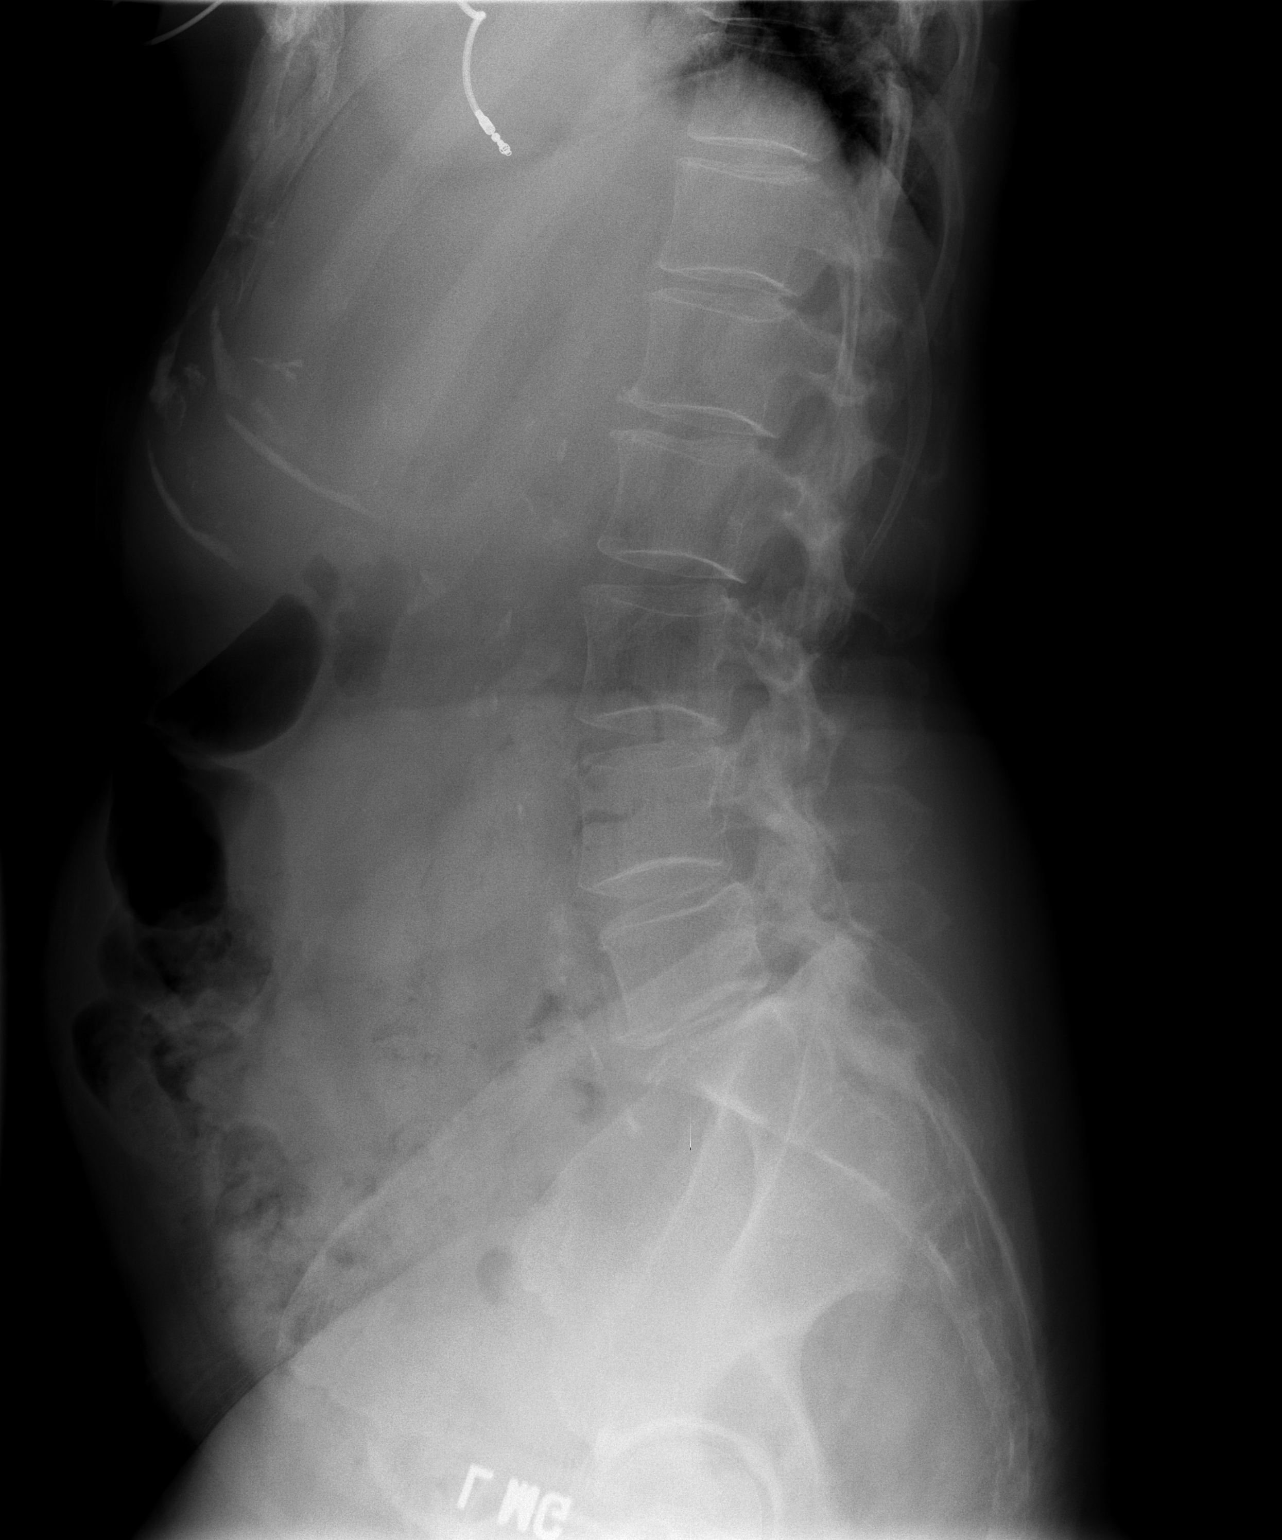

[t l-spine l5-s1 spot]
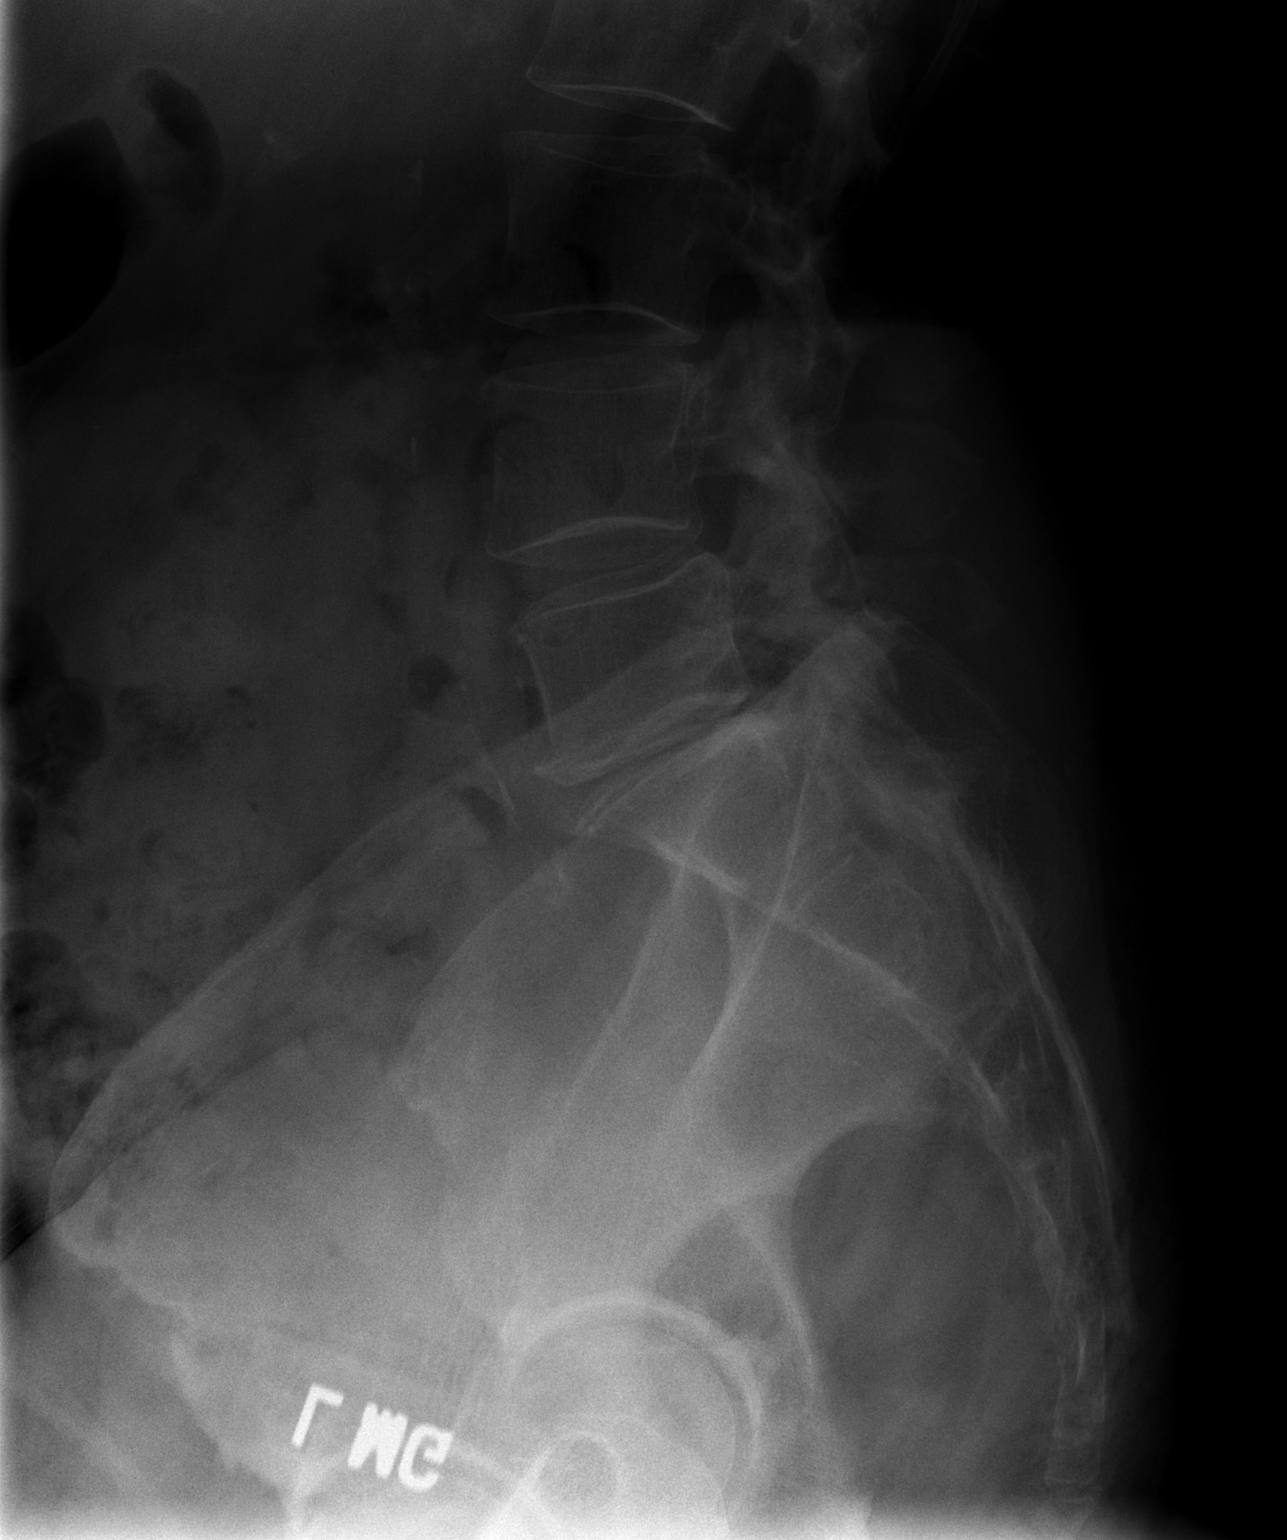

[3 of 3 positions shown; findings below may reference images not displayed]

IMPRESSION: Single focus of increased activity on the right at L4-5.  Plain films to be obtained.
 LUMBAR SPINE:
 Three views of the lumbar spine are compared to today's bone scan.  At the site of increased activity on the right at L4-5, there is degenerative change present, primarily involving the facet joints.  No lytic or blastic lesion is seen.
IMPRESSION: A single focus of activity on the right at L4-5 on bone scan correlates with degenerative change by plain film.  No other abnormality.

## 2007-01-16 ENCOUNTER — Ambulatory Visit (HOSPITAL_COMMUNITY): Admission: RE | Admit: 2007-01-16 | Discharge: 2007-01-16 | Payer: Self-pay | Admitting: Cardiology

## 2007-02-20 IMAGING — CT CT PELVIS W/O CM
2 of 4 series · 11 of 36 positions shown, 18 images · IV contrast (agent unspecified)
Comparison: Plain film dated 06/11/05.

CLINICAL DATA: Coccyx pain, pain with sitting. 
CT PELVIS W/O CONTRAST:
TECHNIQUE: Multiplanar, multisequence MR imaging of the pelvis was performed following the standard protocol.  No intravenous contrast was administered.

[Series 2: pelvis for fx · axial · 0.70mm/px · z∈[-277,-75]mm · 10 of 99 slices shown, 16 images]
[im 9/99  soft-tissue]
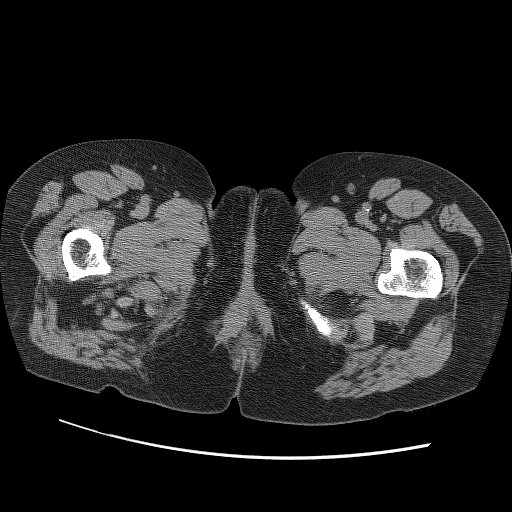
[im 9/99  bone]
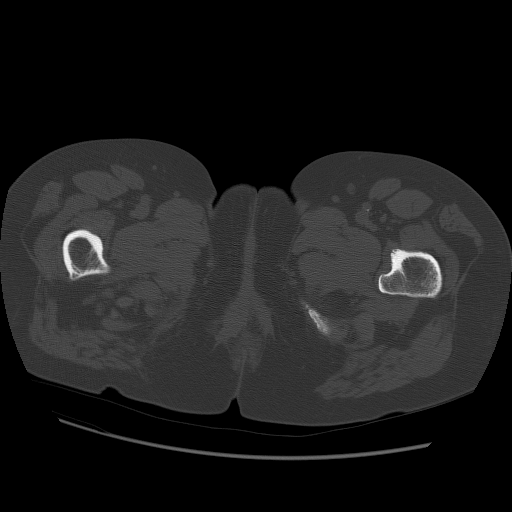
[im 18/99  soft-tissue]
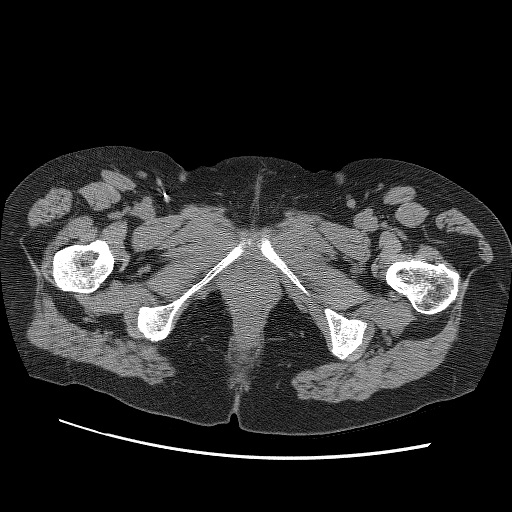
[im 27/99  soft-tissue]
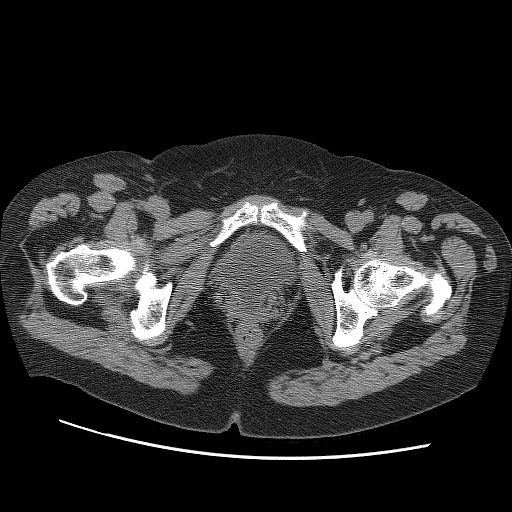
[im 36/99  soft-tissue]
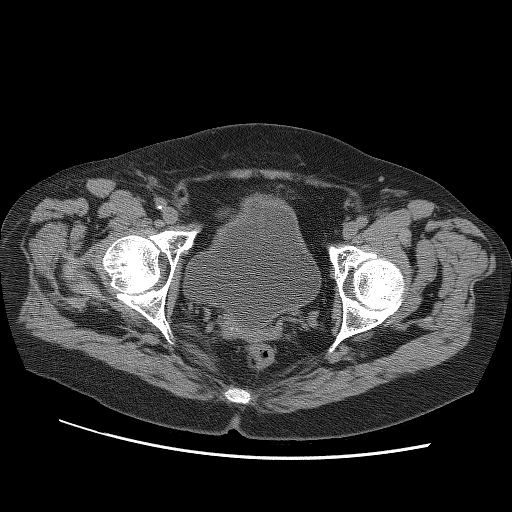
[im 45/99  soft-tissue]
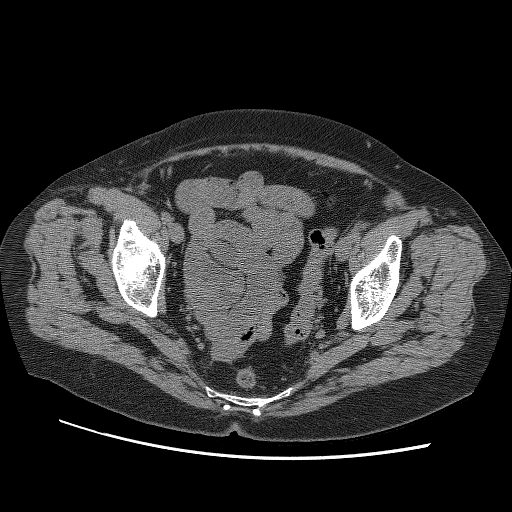
[im 54/99  soft-tissue]
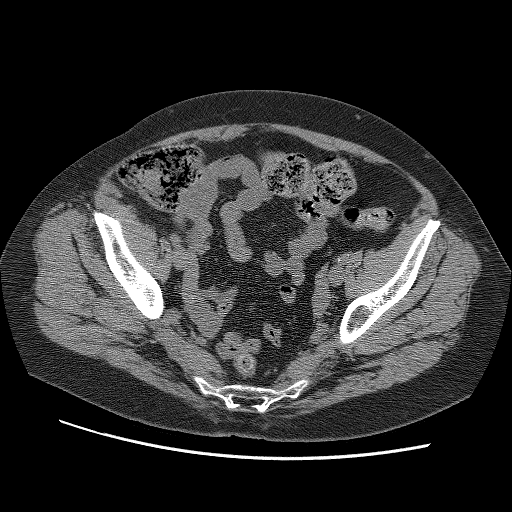
[im 63/99  soft-tissue]
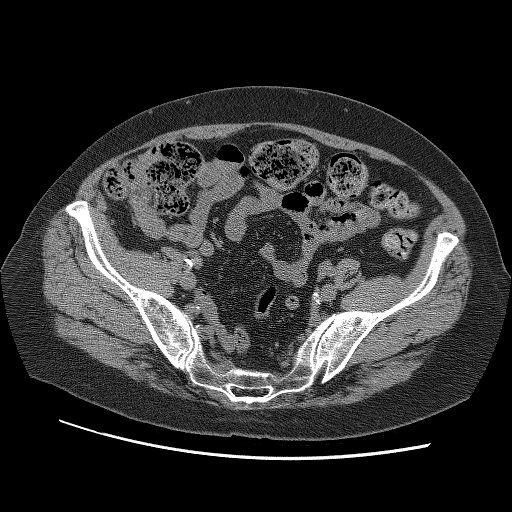
[im 63/99  lung]
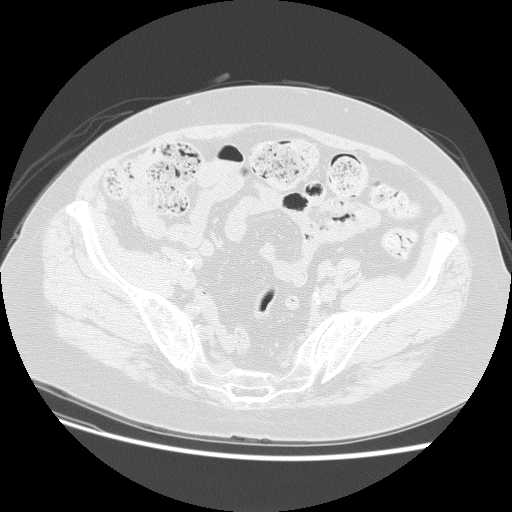
[im 72/99  soft-tissue]
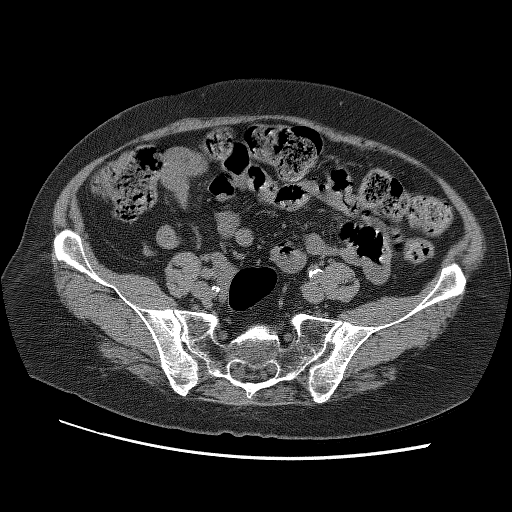
[im 72/99  lung]
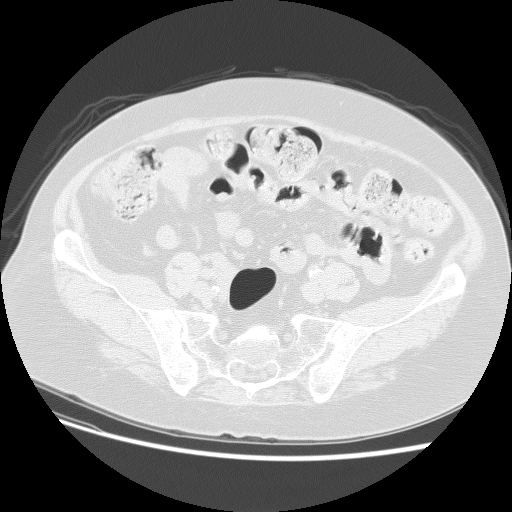
[im 81/99  soft-tissue]
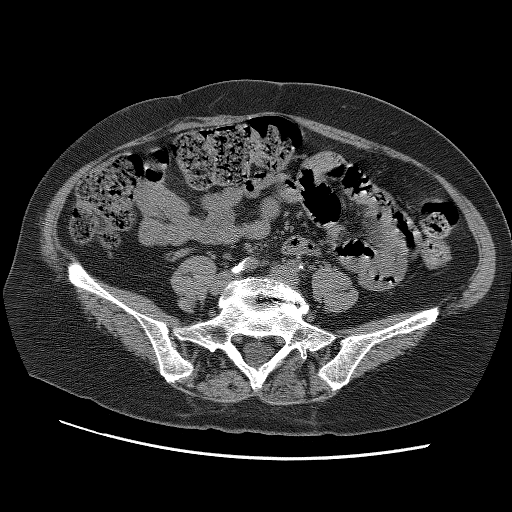
[im 81/99  lung]
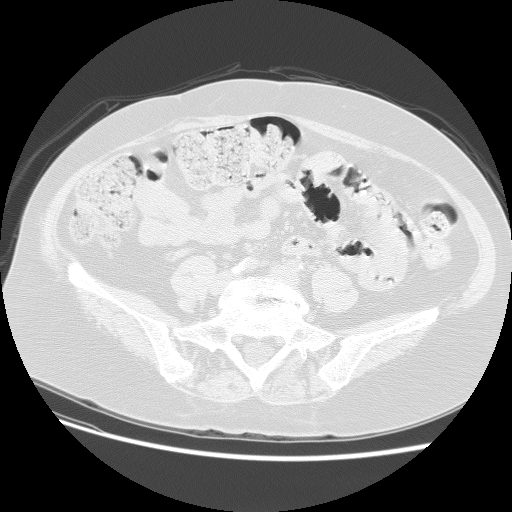
[im 81/99  bone]
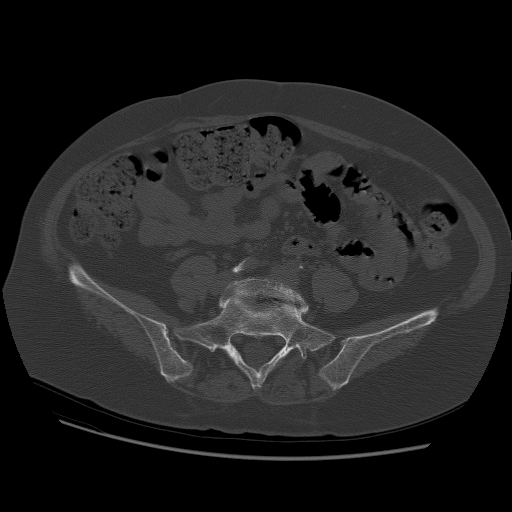
[im 90/99  soft-tissue]
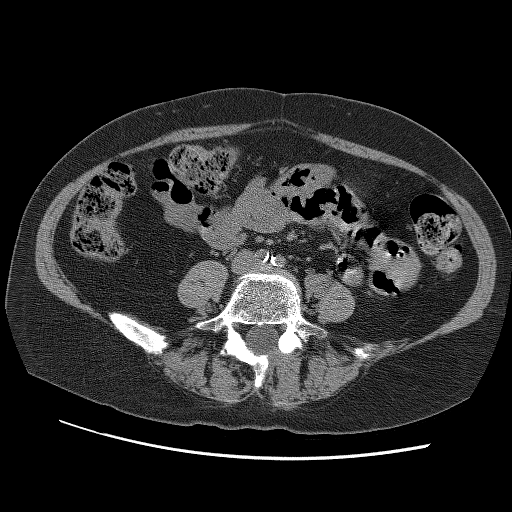
[im 90/99  lung]
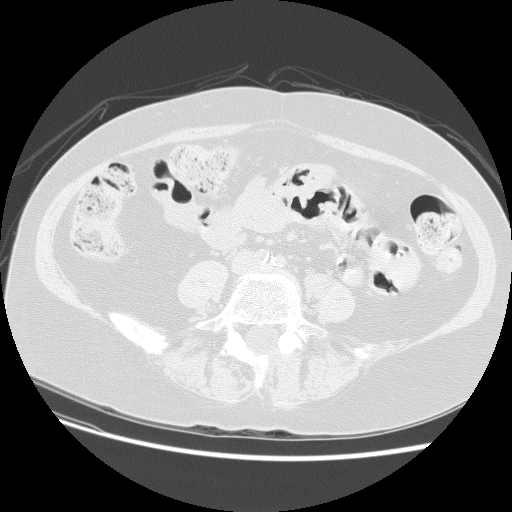

[Series 401: reformatted · coronal · 0.70mm/px · 1 of 40 slices shown, 2 images]
[im 14/40  soft-tissue]
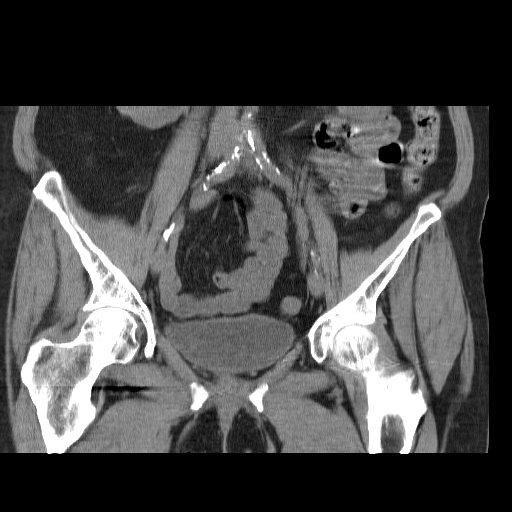
[im 14/40  bone]
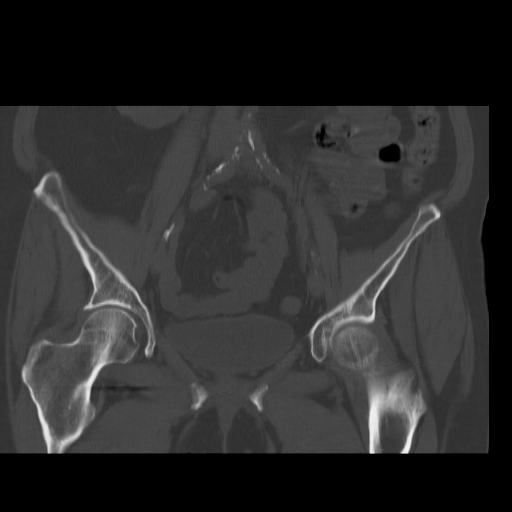

[11 of 36 positions shown; findings below may reference images not displayed]

No free fluid is seen within the anatomic pelvis.  There is no pathologic lymphadenopathy. 
The visualized bowel loops are unremarkable.  
Diverticula arise from the sigmoid colon but there is no evidence for active inflammation. 
The soft tissues overlying the sacrum and coccyx appear intact without evidence for ulceration or mass.  Surgical clips are seen within the right inguinal region.  
There is incidentally noted a pars fracture affecting the L5 vertebral body on the left.  There is no evidence for slippage.  
There is fairly marked disc space narrowing with vacuum disc phenomenon at L5-S1.  Additional, there appears to be posterior disc herniation eccentric to the left which could be impinging upon the nerve root at this level.  An MRI may be helpful for more definitive evaluation. 
Once more you can see disc within the left neural foramina at this level.  
The sacrum and coccyx appear intact.  There is no lytic lesion.  There is no evidence for fracture.
IMPRESSION: 1.  Left L5 pars defect with no evidence for instability. 
2.  L5-S1 disc herniation eccentric to the left with mass affect on the left lateral recess and left neural foramina.

## 2007-03-31 ENCOUNTER — Encounter: Admission: RE | Admit: 2007-03-31 | Discharge: 2007-03-31 | Payer: Self-pay | Admitting: Family Medicine

## 2007-05-18 IMAGING — RF DG ESOPHAGUS
14 of 15 series · 19 of 24 positions shown · non-contrast
Comparison: none

CLINICAL DATA: Dysphagia. 
 BARIUM ESOPHAGRAM:
 The primary peristaltic wave within the esophagus was propulsive to the mid esophageal level but was poorly propulsive within the distal esophagus.  In addition there were numerous tertiary contractions and there were episodes of reversed peristalsis.  Distensible narrowing of the distal esophagus at the GE junction.  A 13mm in diameter barium tablet was administered and lodged within the distal esophagus at this level just superior to the GE junction.  Despite repeated swallowing this remained lodged in this region.  Most likely this is secondary to a combination of a stricture and esophageal dysmotility.  The cervical esophagus has a normal appearance.  There is no aspiration and there were no persistent intrinsic filling defects.

[Series 1: run · 2 of 4 slices shown (1 of 14)]
[im 1/4]
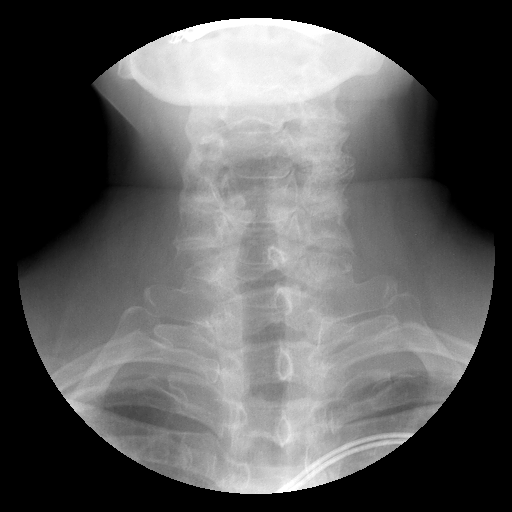
[im 2/4]
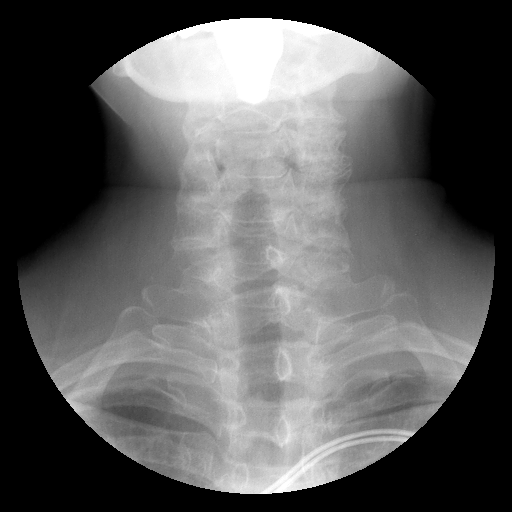

[Series 2: run · 2 of 2 slices shown (2 of 14)]
[im 1/2]
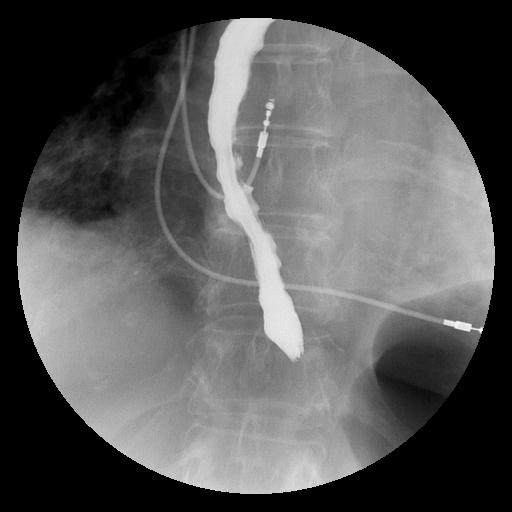
[im 2/2]
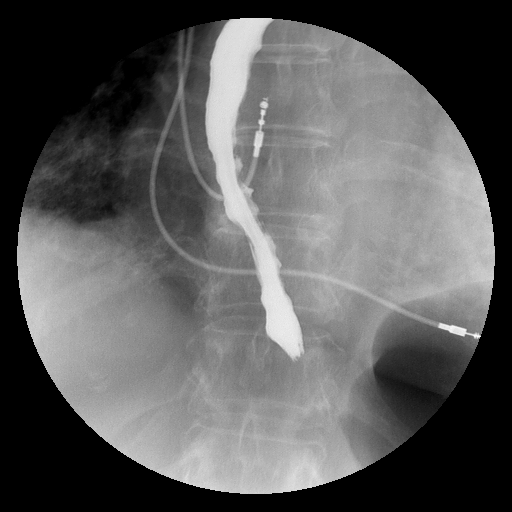

[Series 3: run · 2 of 5 slices shown (3 of 14)]
[im 1/5]
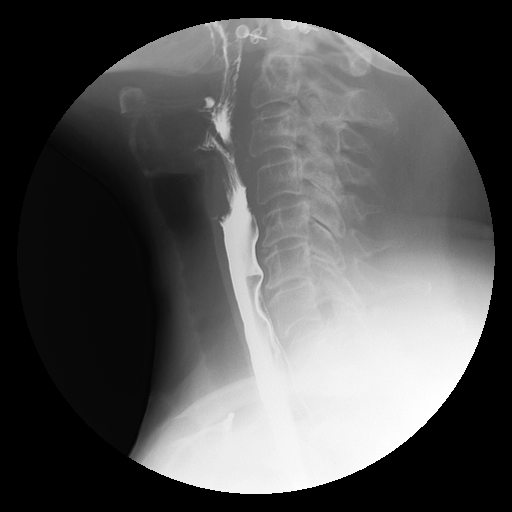
[im 5/5]
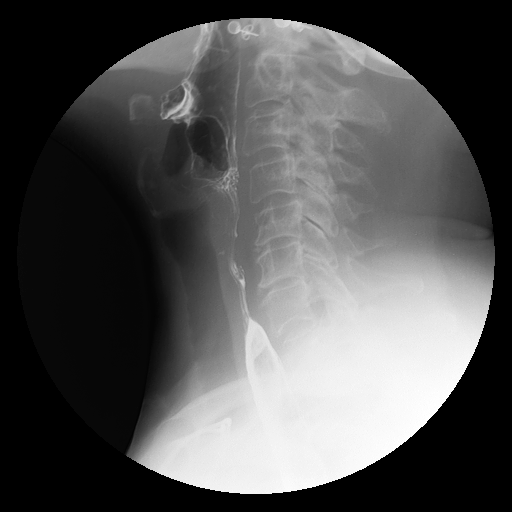

[Series 4: run · 2 of 7 slices shown (4 of 14)]
[im 4/7]
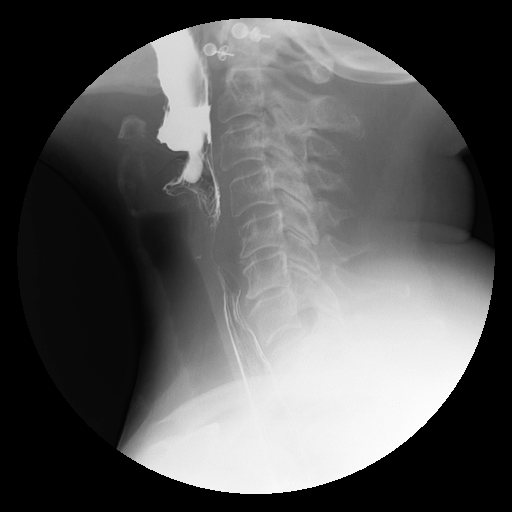
[im 7/7]
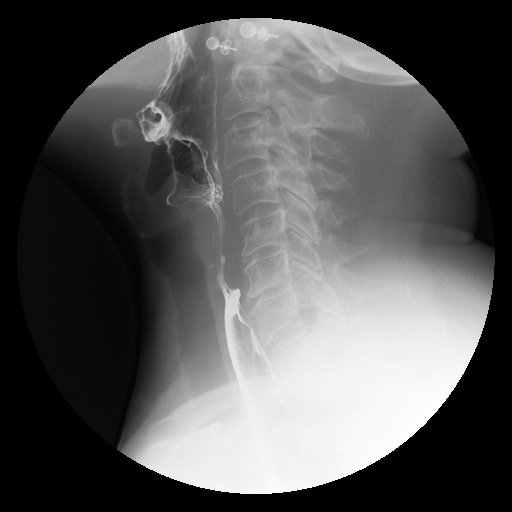

[Series 5: run · 1 of 2 slices shown (5 of 14)]
[im 1/2]
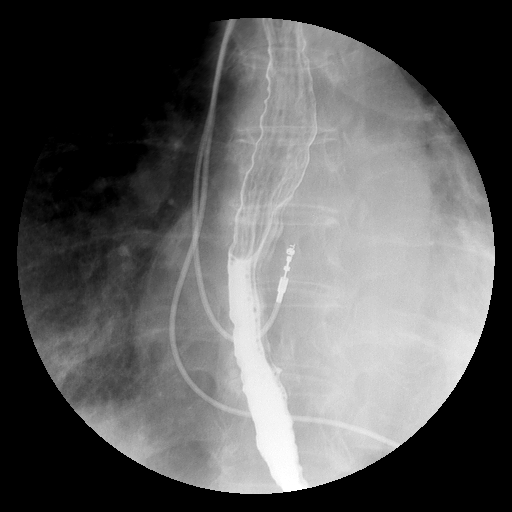

[Series 7: run · 1 of 1 slices shown (6 of 14)]
[im 1/1]
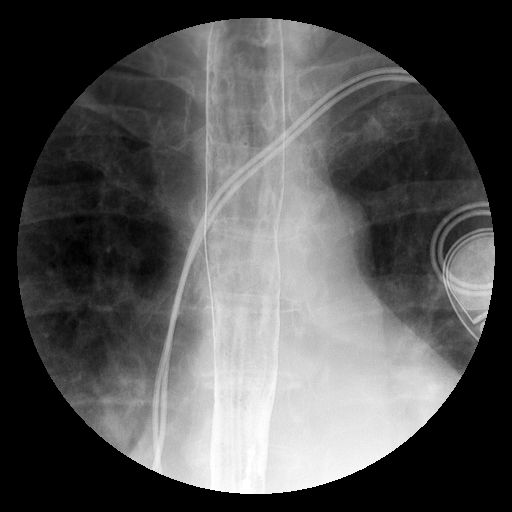

[Series 8: run · 2 of 5 slices shown (7 of 14)]
[im 1/5]
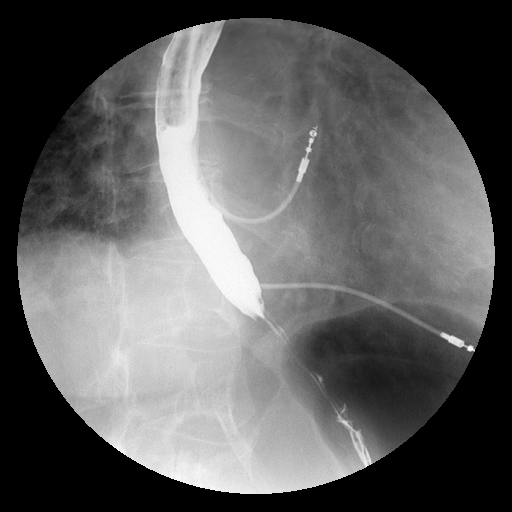
[im 5/5]
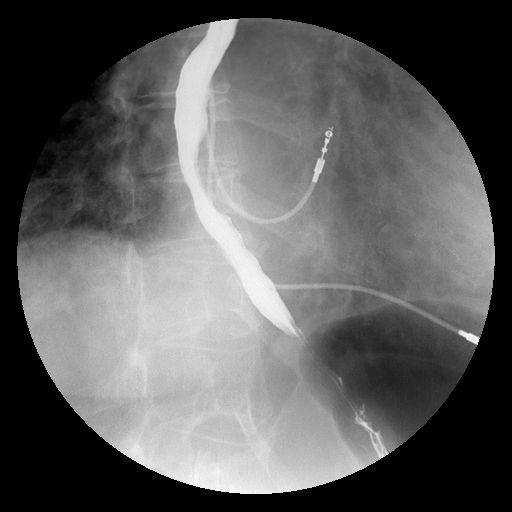

[Series 9: run · 1 of 5 slices shown (8 of 14)]
[im 1/5]
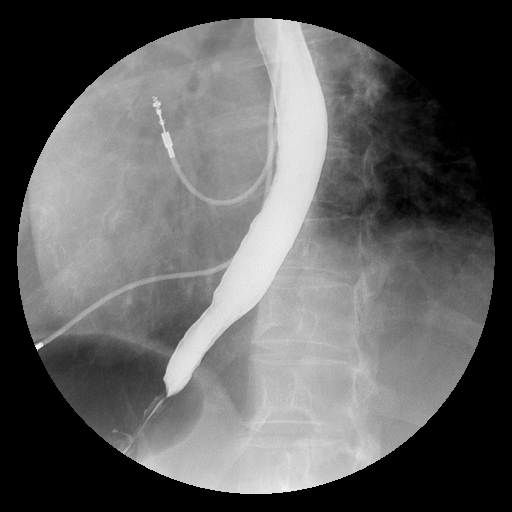

[Series 10: run · 1 of 2 slices shown (9 of 14)]
[im 1/2]
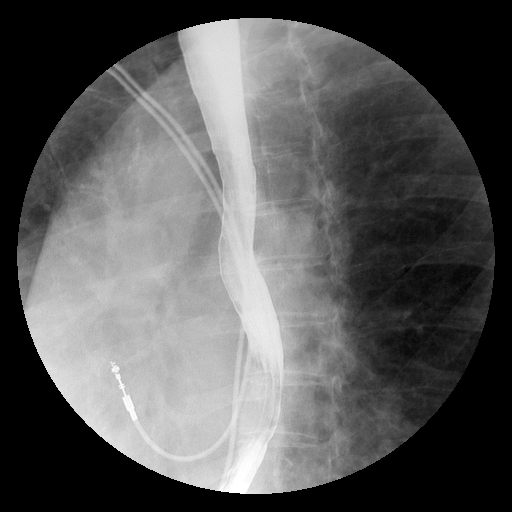

[Series 11: run · 1 of 3 slices shown (10 of 14)]
[im 1/3]
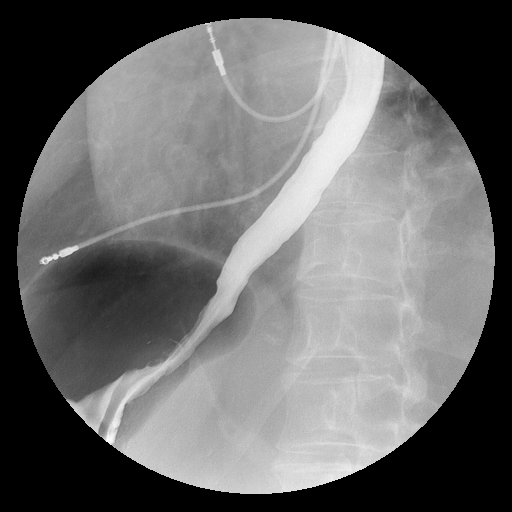

[Series 12: run · 1 of 1 slices shown (11 of 14)]
[im 1/1]
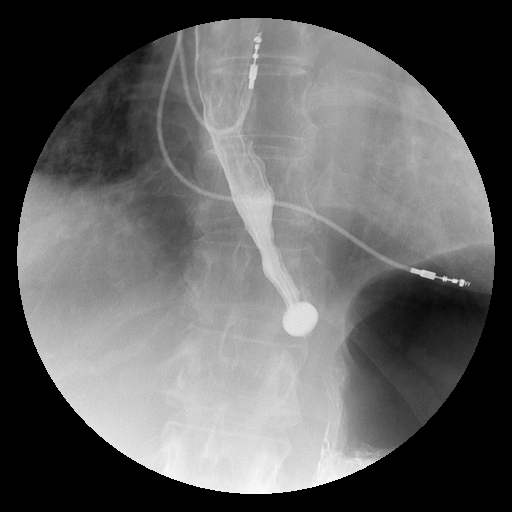

[Series 13: run · 1 of 2 slices shown (12 of 14)]
[im 1/2]
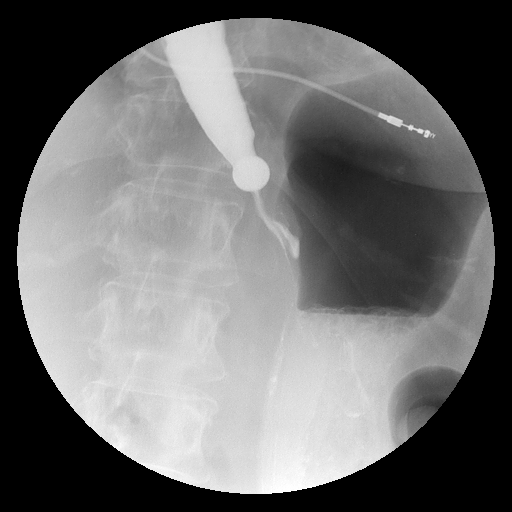

[Series 14: run · 1 of 5 slices shown (13 of 14)]
[im 5/5]
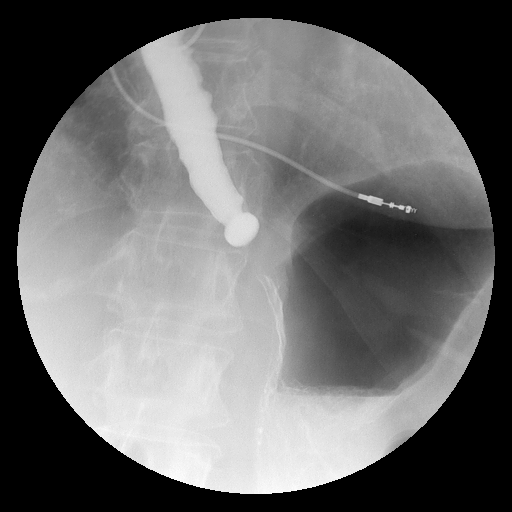

[Series 15: run · 1 of 3 slices shown (14 of 14)]
[im 1/3]
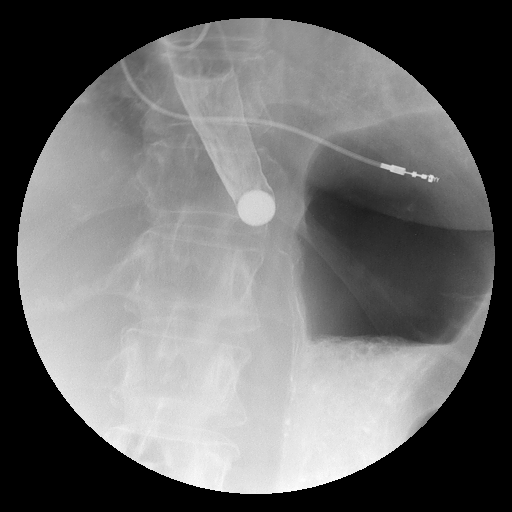

[19 of 24 positions shown; findings below may reference images not displayed]

IMPRESSION: Mid and distal esophageal dysmotility as discussed above with probable superimposed smooth stricture at the GE junction also as discussed above.

## 2007-05-25 ENCOUNTER — Encounter: Admission: RE | Admit: 2007-05-25 | Discharge: 2007-05-25 | Payer: Self-pay | Admitting: Cardiology

## 2007-06-17 IMAGING — CT CT ABDOMEN W/ CM
2 of 5 series · 13 of 32 positions shown, 18 images · IV contrast (READICAT/WATER & [ID] OMNI 300)
Comparison: CT pelvis of 08/23/05.
COMPARISON: CT pelvis of 08/23/05.

CLINICAL DATA: Anal and rectal pain.  
 ABDOMEN CT WITH CONTRAST:
TECHNIQUE: Multidetector CT imaging of the abdomen was performed following the standard protocol during bolus administration of intravenous contrast.
 Contrast:  100 cc of Omnipaque 300.
TECHNIQUE: Multidetector CT imaging of the pelvis was performed following the standard protocol during bolus administration of intravenous contrast.

[Series 2: routine abdomen · axial · 0.70mm/px · z∈[-334,-54]mm · 5 of 83 slices shown, 10 images]
[im 14/83  soft-tissue]
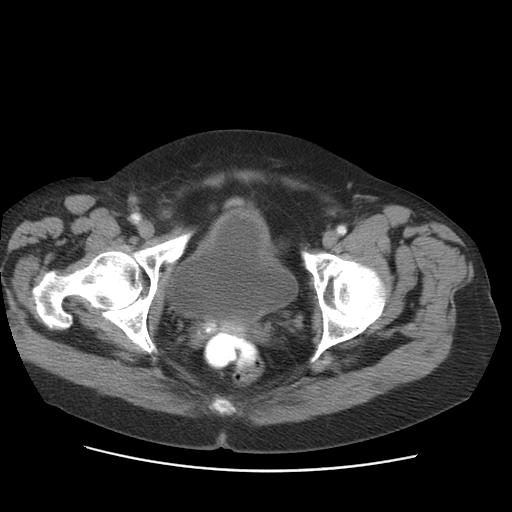
[im 14/83  bone]
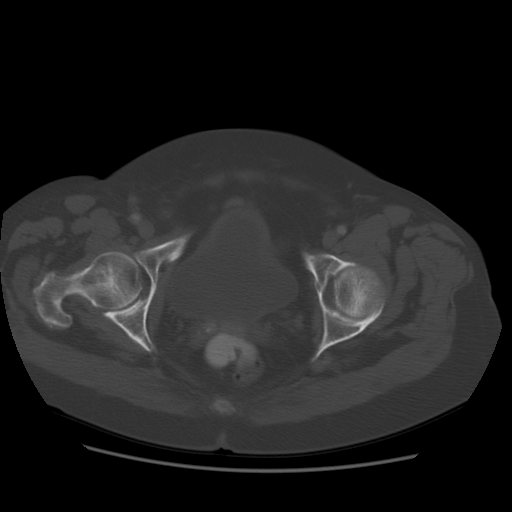
[im 28/83  soft-tissue]
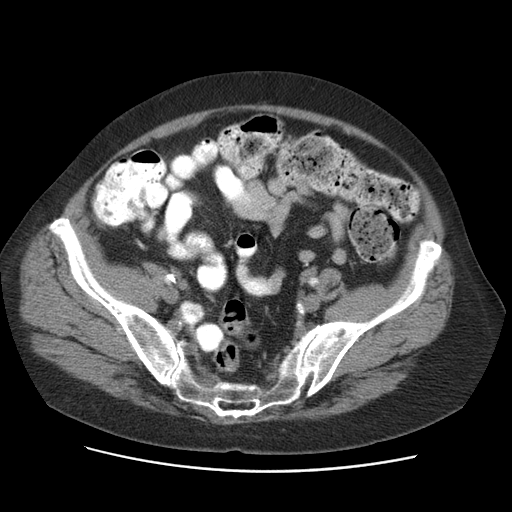
[im 28/83  lung]
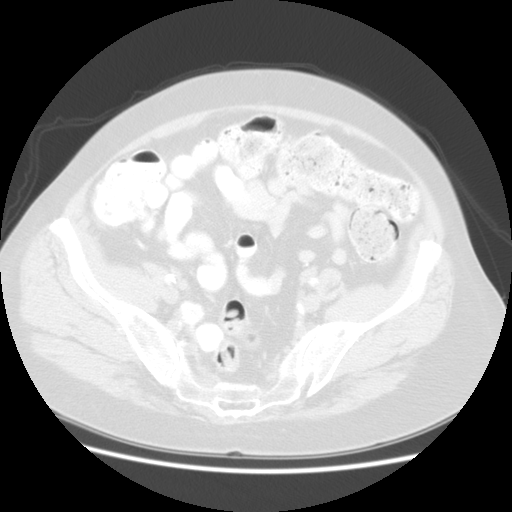
[im 42/83  soft-tissue]
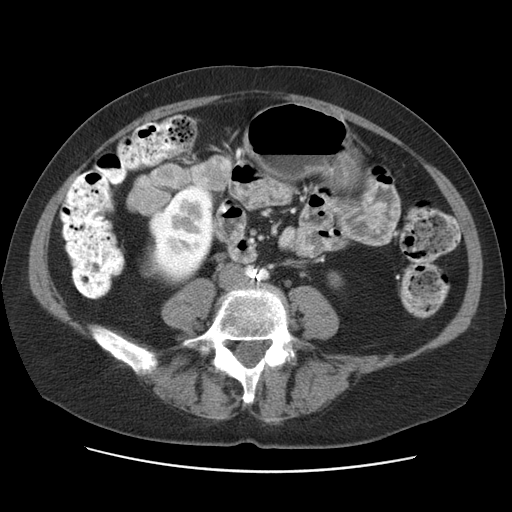
[im 42/83  lung]
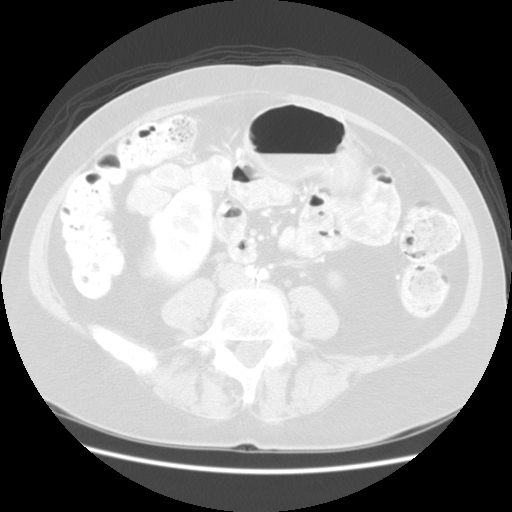
[im 55/83  soft-tissue]
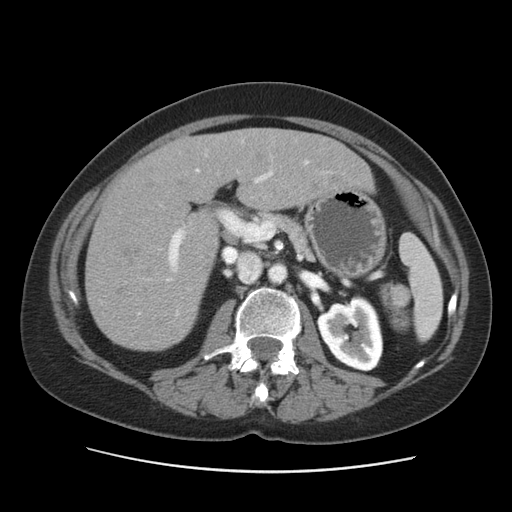
[im 55/83  lung]
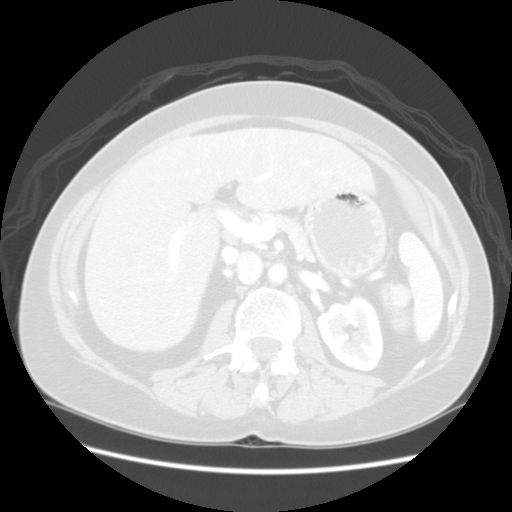
[im 69/83  soft-tissue]
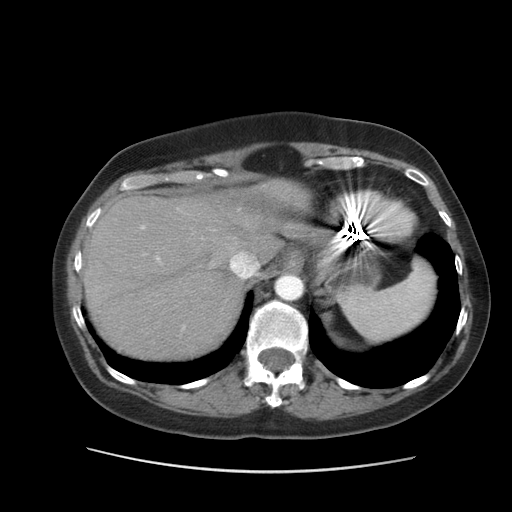
[im 69/83  lung]
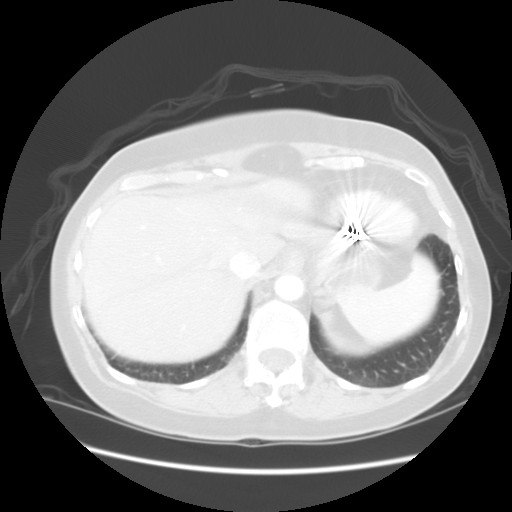

[Series 103: reformatted · sagittal · 0.84mm/px · 8 of 129 slices shown]
[im 13/129  soft-tissue]
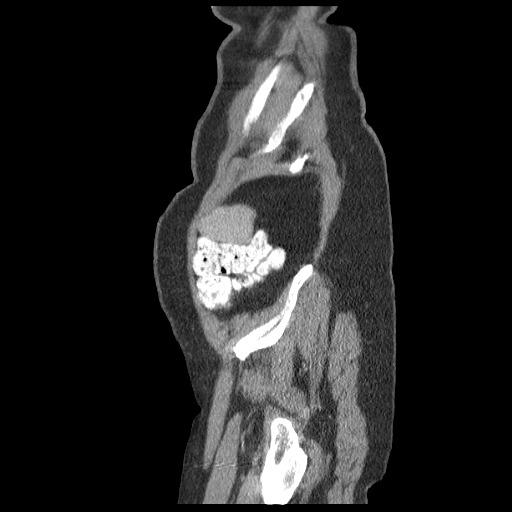
[im 26/129  soft-tissue]
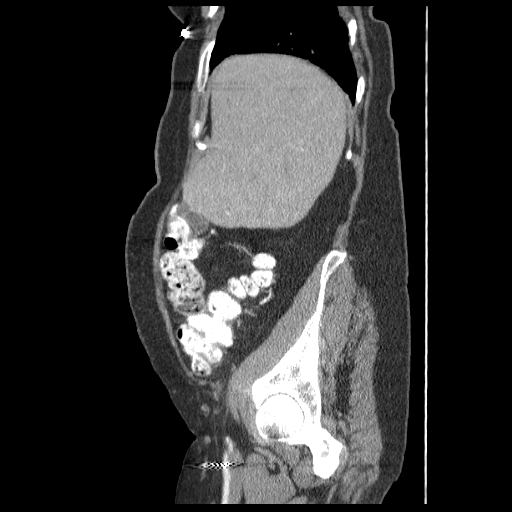
[im 39/129  soft-tissue]
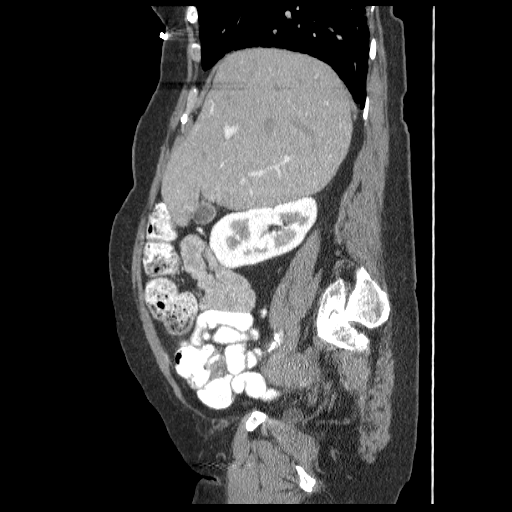
[im 52/129  soft-tissue]
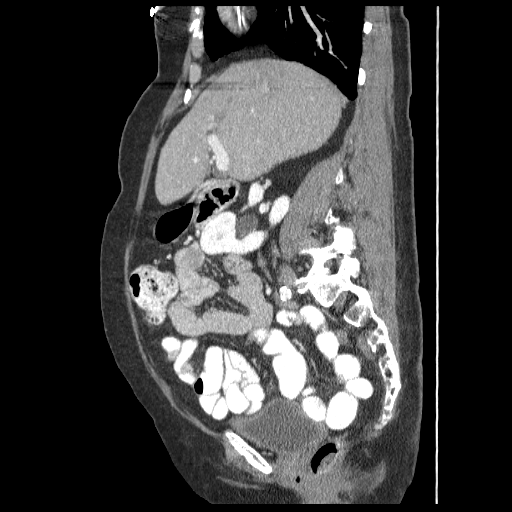
[im 77/129  soft-tissue]
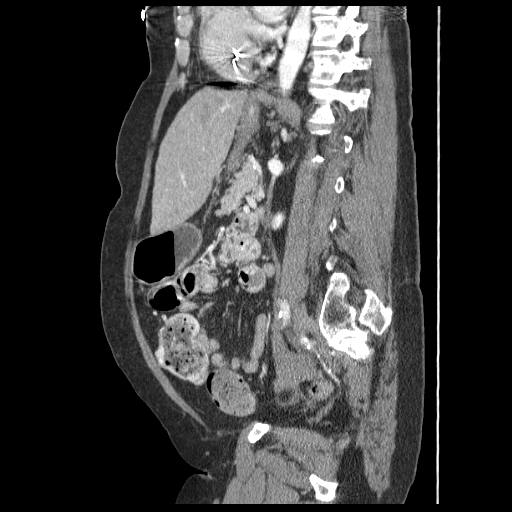
[im 90/129  soft-tissue]
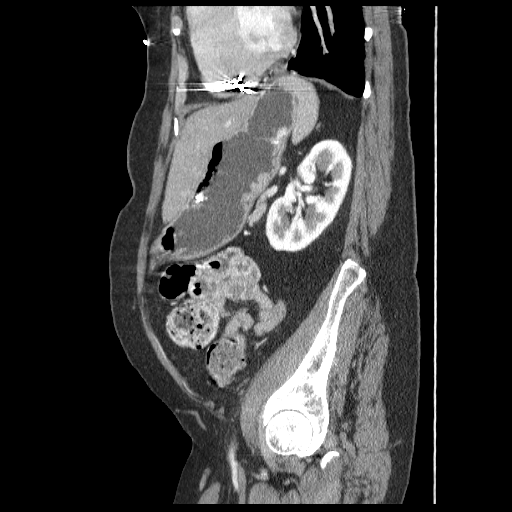
[im 103/129  soft-tissue]
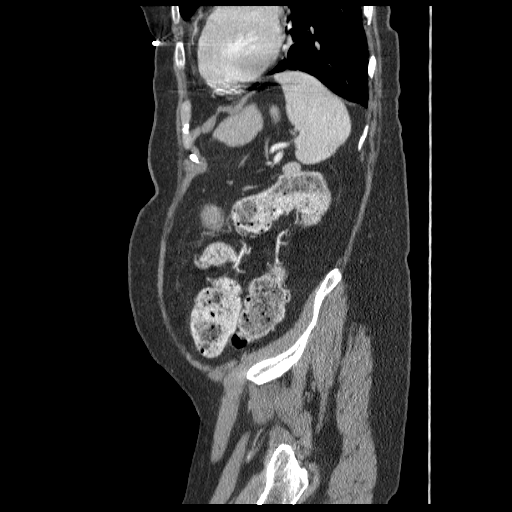
[im 116/129  soft-tissue]
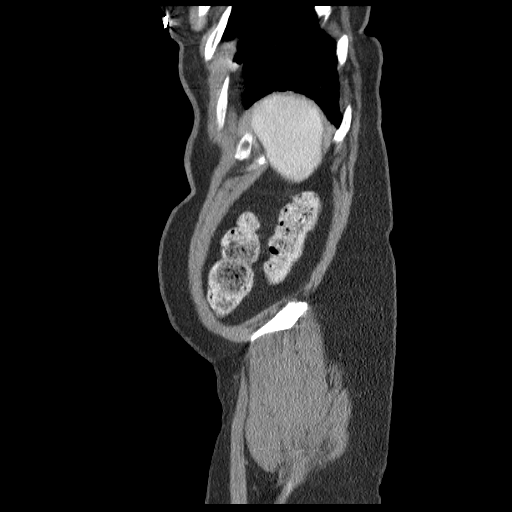

[13 of 32 positions shown; findings below may reference images not displayed]

The lung bases are clear.  There is mild cardiomegaly present.  The liver enhances normally with no focal abnormality, and no ductal dilatation is seen.  No calcified gallstones are noted.  The pancreas is normal in size with normal peripancreatic fat planes.  The adrenal glands and spleen appear normal.  The kidneys enhance normally and, on delayed images, the pelvocaliceal systems appear normal.  There are two lobular areas emanating from the lower pole of the right kidney.  The one more anteriorly may represent normal lobulation, however, the low attenuation posteriorly could represent cyst or complex cyst, and ultrasound may be helpful to assess further.  This area measures 9 mm in diameter.  The abdominal aorta is normal in caliber.  No adenopathy is seen.
IMPRESSION: No acute abnormality on CT of the pelvis.  Probable complex cyst emanates from the lower pole of the right kidney posteriorly.  Consider ultrasound to assess further.   The right kidney is somewhat ptotic in position.
 PELVIS CT WITH CONTRAST:
The urinary bladder is unremarkable.  The patient has previously undergone hysterectomy.  Low attenuation area in the left adnexa contains fat and is of questionable significance.  There is feces throughout the colon consistent with constipation. The appendix is not seen.   The terminal ileum appears normal.  No perirectal abnormality is seen.  No fluid is noted within the pelvis.
IMPRESSION: No acute abnormality on CT of the pelvis.  There is feces throughout the colon consistent with constipation.

## 2007-06-19 ENCOUNTER — Encounter: Admission: RE | Admit: 2007-06-19 | Discharge: 2007-06-19 | Payer: Self-pay | Admitting: Family Medicine

## 2007-07-13 IMAGING — CT CT L SPINE W/ CM
2 of 8 series · 11 of 27 positions shown, 13 images · non-contrast
Comparison: 08/23/05.

CLINICAL DATA: This patient has severe coccygeal pain as well as intermittent pain extending down her legs.  
 LUMBAR MYELOGRAM:
TECHNIQUE: Multidetector CT imaging of the lumbar spine was performed after intrathecal injection of contrast.  Helical imaging supplemented with coronal and sagittal reformatted images.

[Series 4: recon 3: l-spine helical · axial · 0.27mm/px · z∈[-230,-12]mm · 7 of 234 slices shown, 9 images]
[im 30/234  soft-tissue]
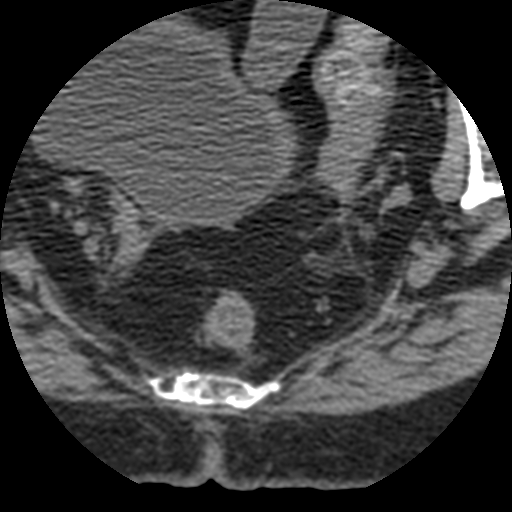
[im 30/234  bone]
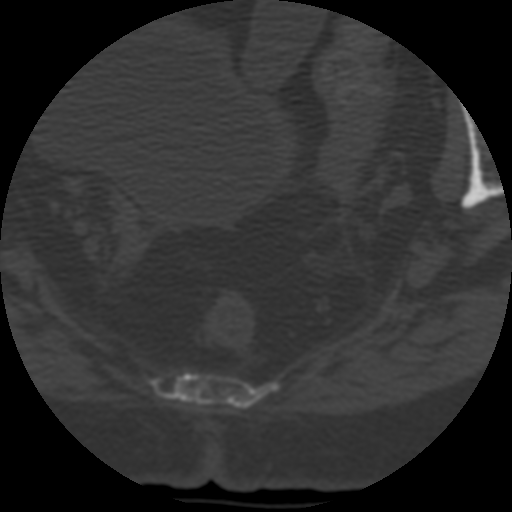
[im 59/234  bone]
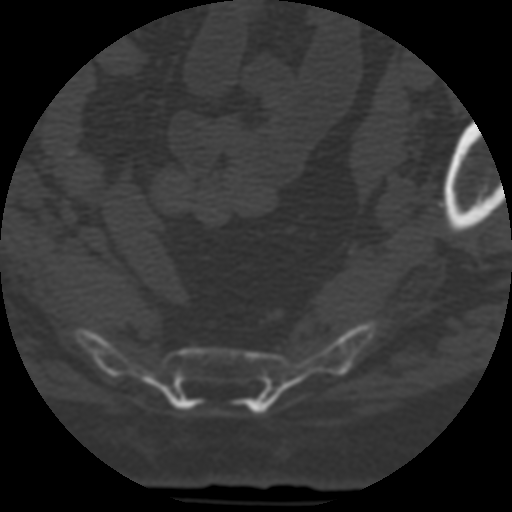
[im 88/234  bone]
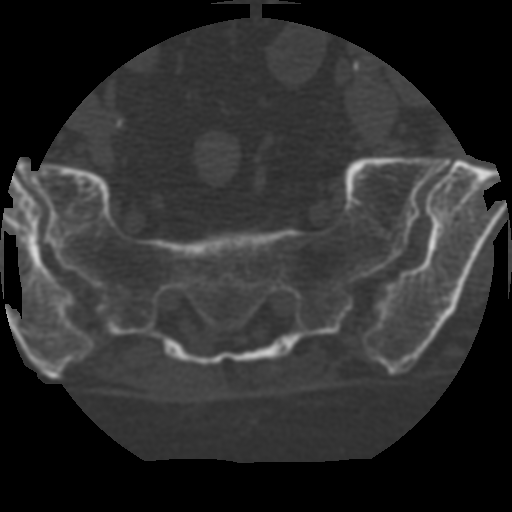
[im 117/234  bone]
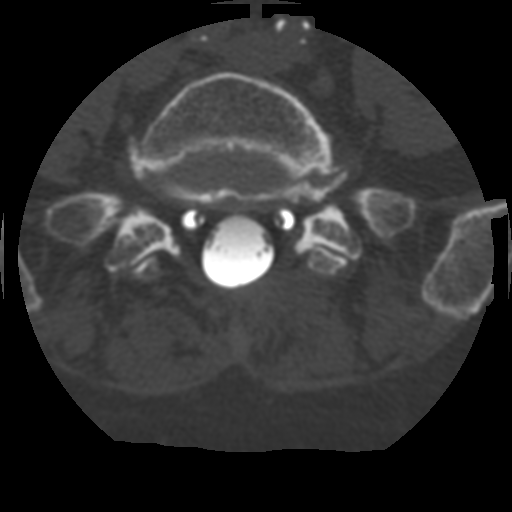
[im 146/234  soft-tissue]
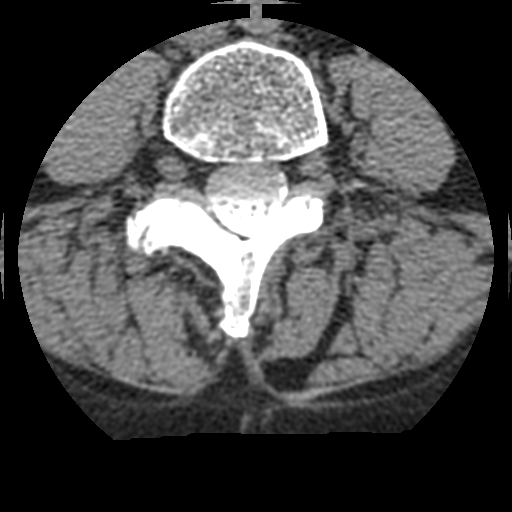
[im 146/234  bone]
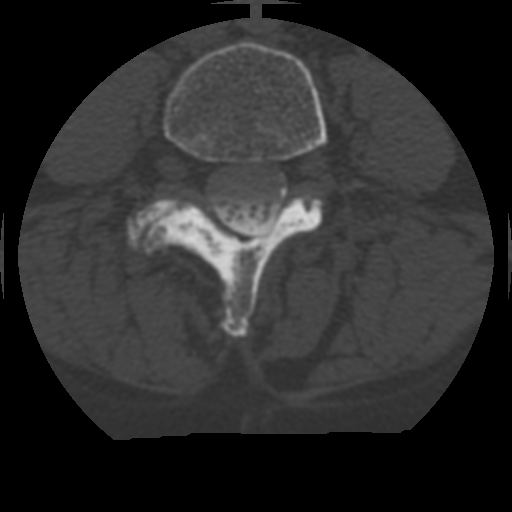
[im 175/234  bone]
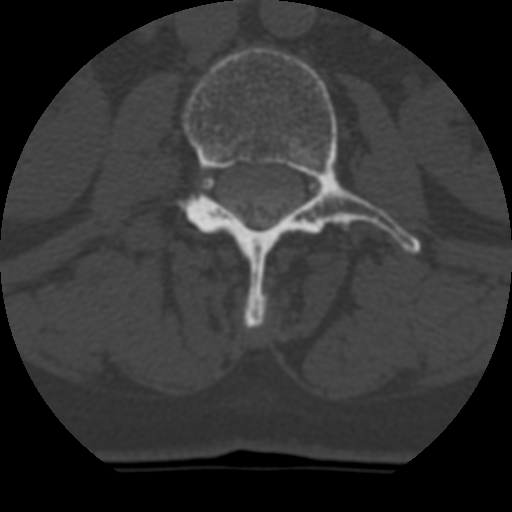
[im 204/234  bone]
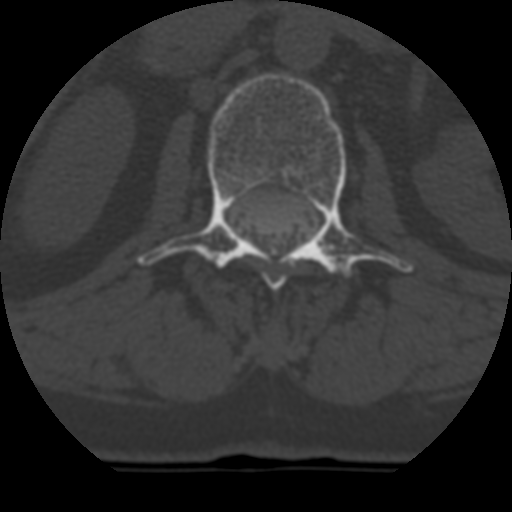

[Series 401: reformatted · coronal · 0.59mm/px · 4 of 40 slices shown]
[im 8/40  bone]
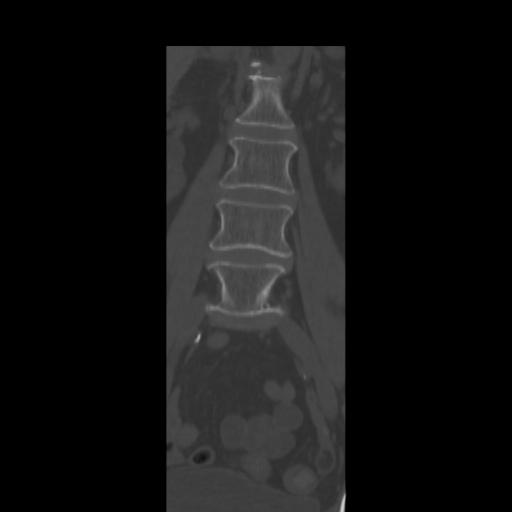
[im 16/40  bone]
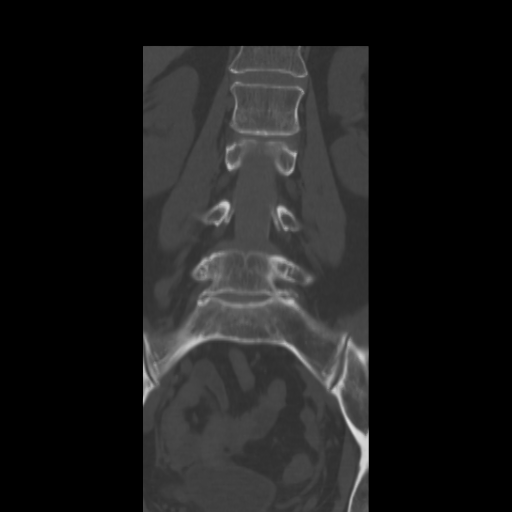
[im 24/40  bone]
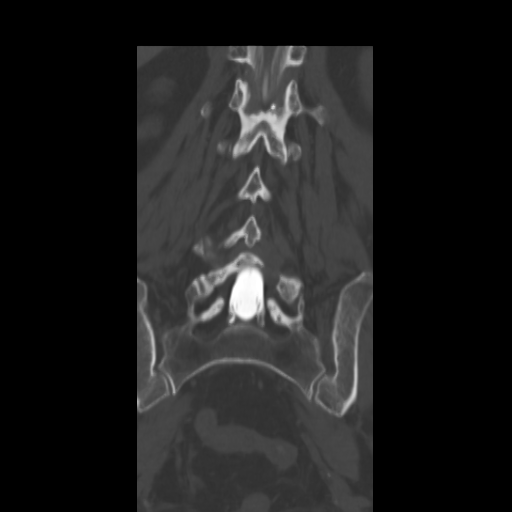
[im 32/40  bone]
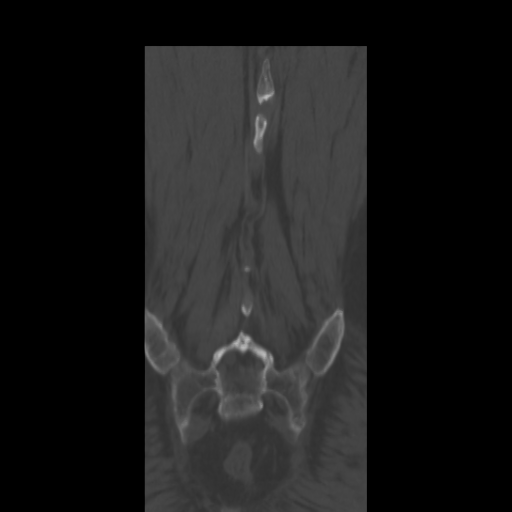

[11 of 27 positions shown; findings below may reference images not displayed]

FINDINGS: The patient has five non-rib bearing lumbar vertebra.  Marked disk height loss at L5-S1.   There is no appreciable listhesis.  Shallow ventral defects seen at L4-5.  
 Segmentation of the coccyx is appreciated. This is more pronounced than on the CT study 08/23/05.  This was not clearly imaged on the CT myelogram study of 04/11/04. 
 Advanced facet degenerative changes are seen at L4-5 and L5-S1.  A mild extradural defect is seen on the left at the L4-5 disk level.  Mild truncation of the left L5 and S1 roots.
IMPRESSION: 1.  Degenerative disk disease L4-5, L5-S1.
 2.  Mild truncation left L5 and S1 roots.
 3.  Segmentation of the lower coccyx.  More pronounced than on the prior exams. 
 POST MYELOGRAM CT SCAN OF THE LUMBAR SPINE:
FINDINGS: On the sagittal reformatted image, advanced degenerative disk disease at L5-S1.   This has a similar appearance to a CT of the lumbar spine 08/23/05.  There is segmentation of the coccyx.  There is slight increased distraction compared with the prior CT study.  No appreciable displacement. This is of questionable significance.  
 Axial imaging:
 L1-2:  Facet degenerative change without central nor foraminal stenosis.
 L2-3:  Mild facet degenerative change.
 L3-4:  Disk bulge without focal protrusion.   Facet degenerative change bilaterally without central nor lateral recess stenosis.
 L4-5:  Laminectomy defect.  Advanced facet degenerative change bilaterally.  Disk bulge eccentric right.  The L4 roots exit encroachment.  There is no appreciable central stenosis.
 L5-S1:  Left hemilaminectomy defect.  Disk bulge eccentric left.  The L5 roots exit without encroachment.  No appreciable central stenosis.
IMPRESSION: 1.  Facet degenerative changes are appreciated at L4-5, L5-S1.  No appreciable central nor foraminal stenosis. 
 2.  Advanced degenerative disk disease L5-S1 similar in appearance to the prior study.  Disk bulge left in conjunction with posterior uncinate spurring and disk height loss results in moderate foraminal narrowing, left greater than right.   Further compounded facet hypertrophy.
 3.  There is segmentation of the coccyx.  This is doubtful significance, however, is slightly more conspicuous on today?s study compared with the 08/23/05 exam with slight increased distraction.

## 2007-07-13 IMAGING — RF DG MYELOGRAM LUMBAR
13 of 21 series · 13 of 21 positions shown · non-contrast
Comparison: 08/23/05.

CLINICAL DATA: This patient has severe coccygeal pain as well as intermittent pain extending down her legs.  
 LUMBAR MYELOGRAM:
TECHNIQUE: Multidetector CT imaging of the lumbar spine was performed after intrathecal injection of contrast.  Helical imaging supplemented with coronal and sagittal reformatted images.

[Series 1: (hospital) · 1 of 1 slices shown]
[im 1/1]
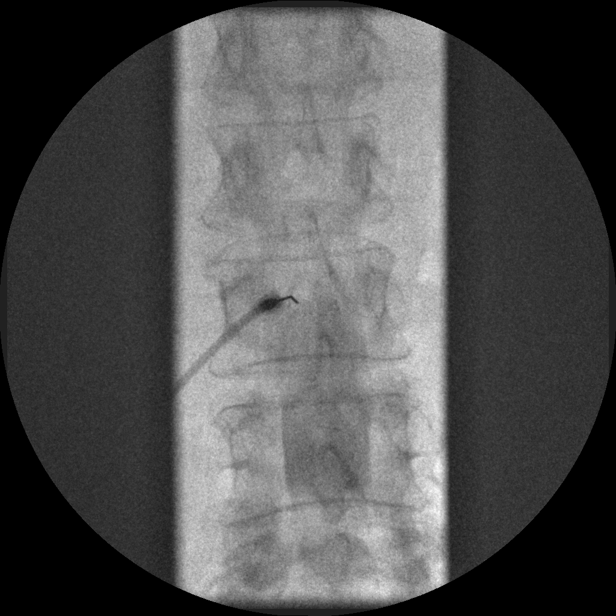

[Series 3: myelogram  white · 1 of 1 slices shown (1 of 12)]
[im 1/1]
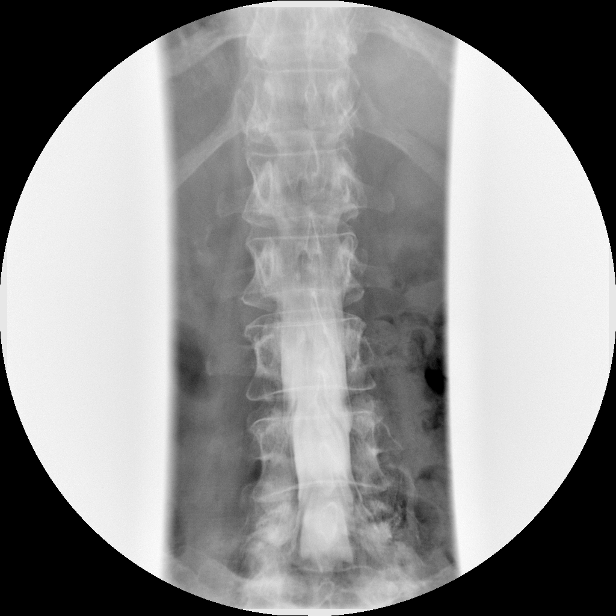

[Series 5: myelogram  white · 1 of 1 slices shown (2 of 12)]
[im 1/1]
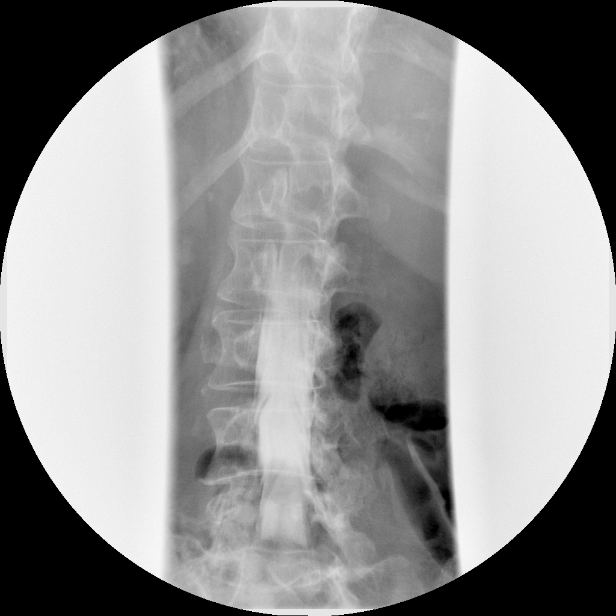

[Series 6: myelogram  white · 1 of 1 slices shown (3 of 12)]
[im 1/1]
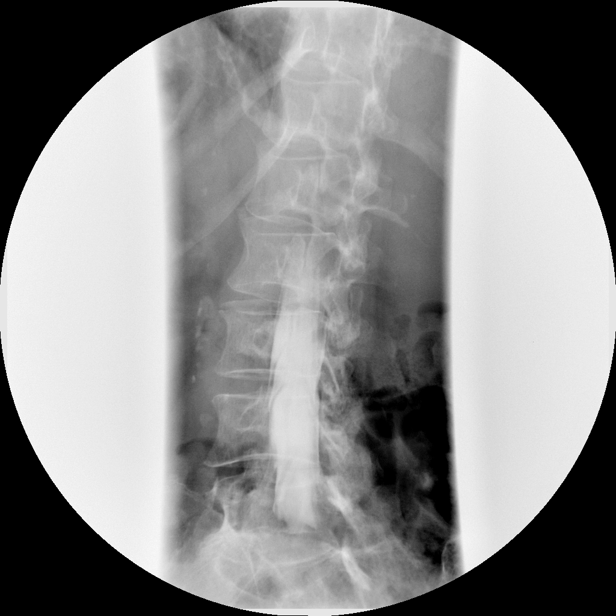

[Series 8: myelogram  white · 1 of 1 slices shown (4 of 12)]
[im 1/1]
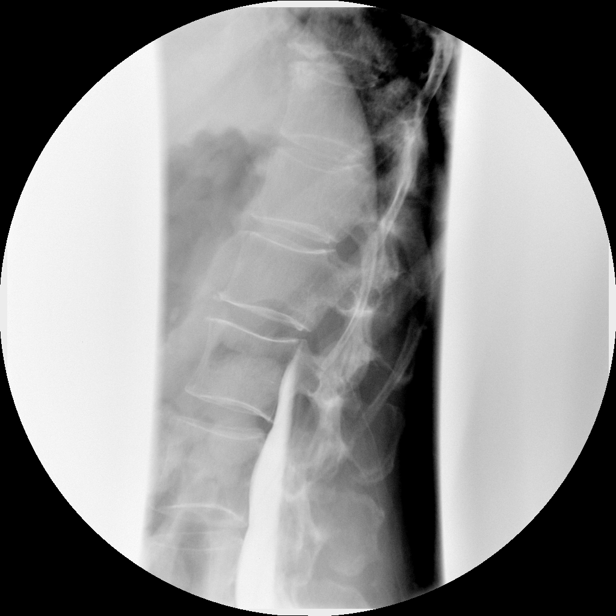

[Series 9: myelogram  white · 1 of 1 slices shown (5 of 12)]
[im 1/1]
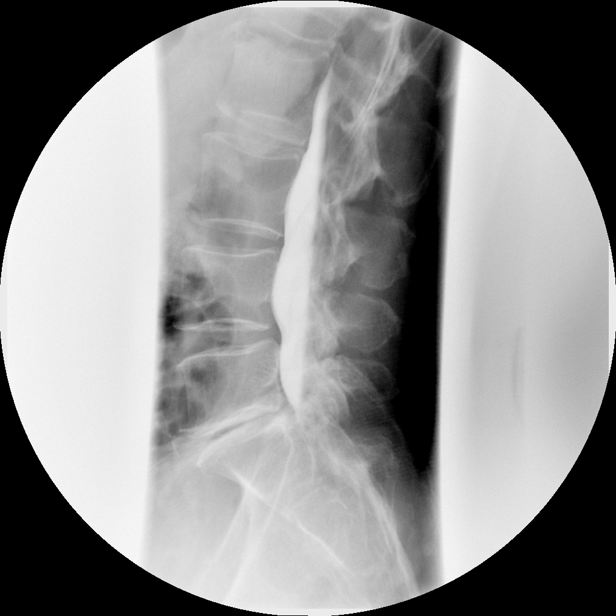

[Series 11: myelogram  white · 1 of 1 slices shown (6 of 12)]
[im 1/1]
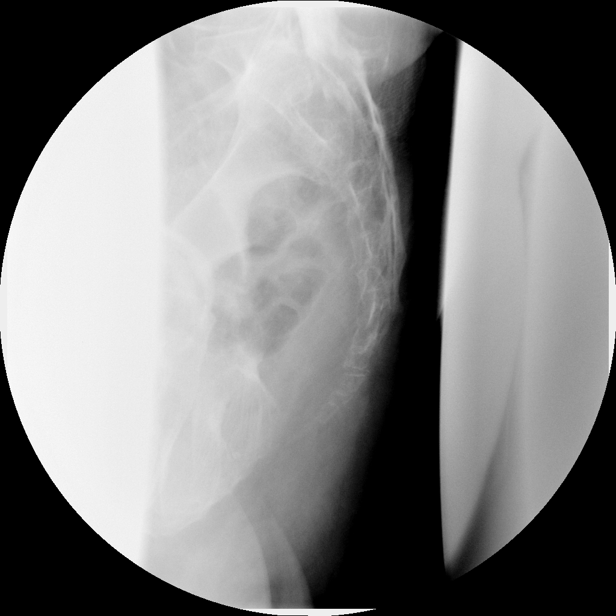

[Series 13: myelogram  white · 1 of 1 slices shown (7 of 12)]
[im 1/1]
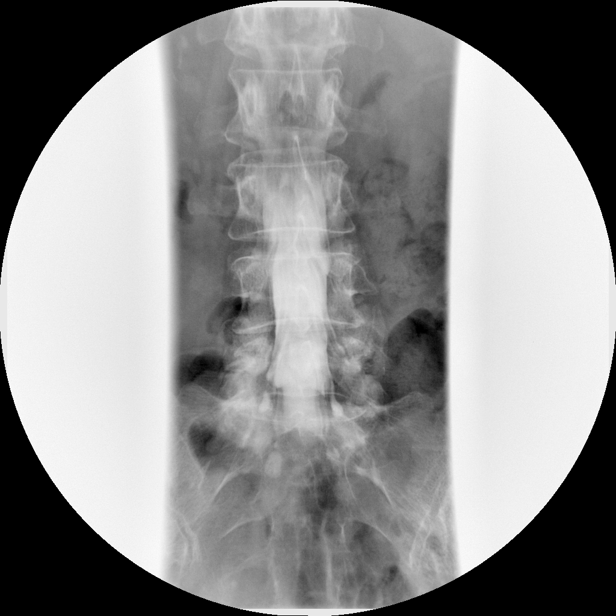

[Series 14: myelogram  white · 1 of 1 slices shown (8 of 12)]
[im 1/1]
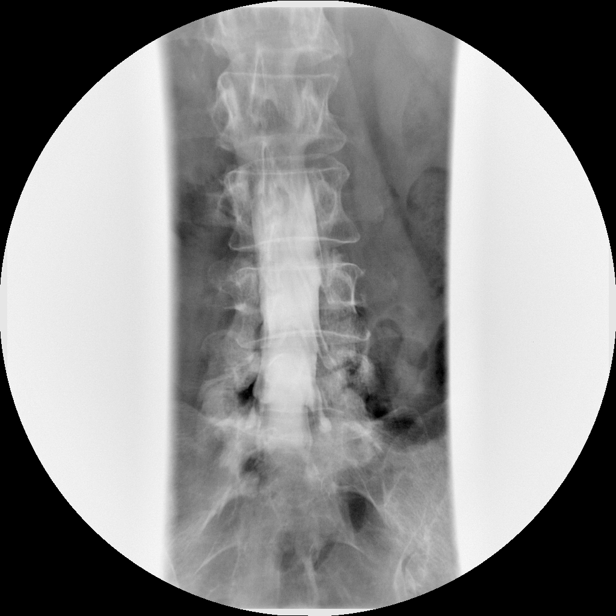

[Series 16: myelogram  white · 1 of 1 slices shown (9 of 12)]
[im 1/1]
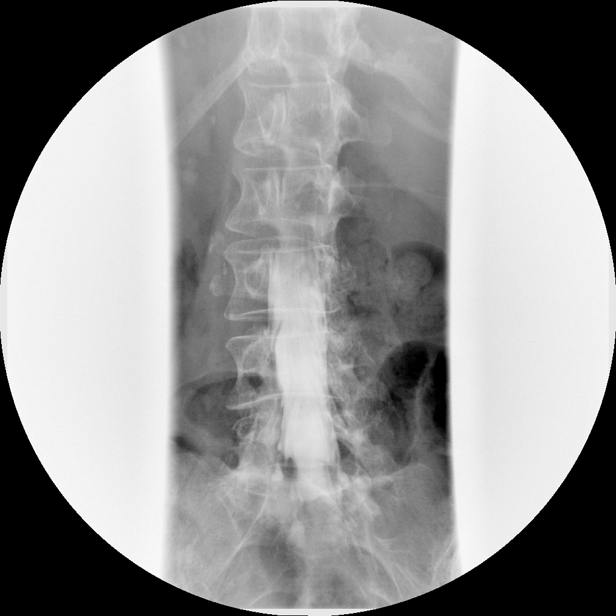

[Series 17: myelogram  white · 1 of 1 slices shown (10 of 12)]
[im 1/1]
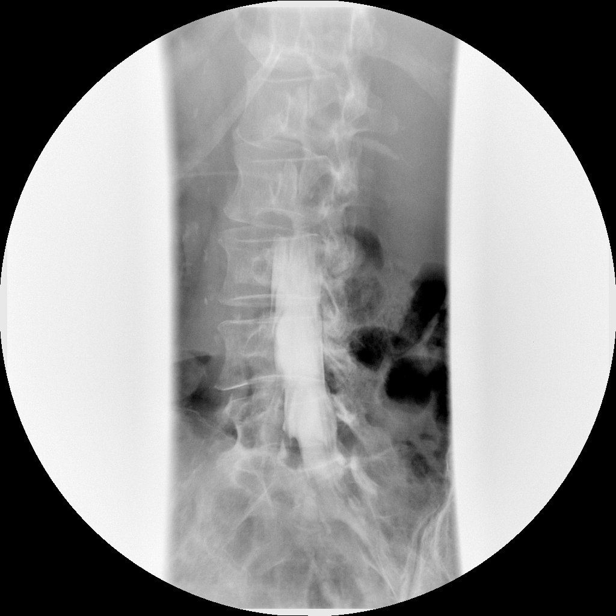

[Series 19: myelogram  white · 1 of 1 slices shown (11 of 12)]
[im 1/1]
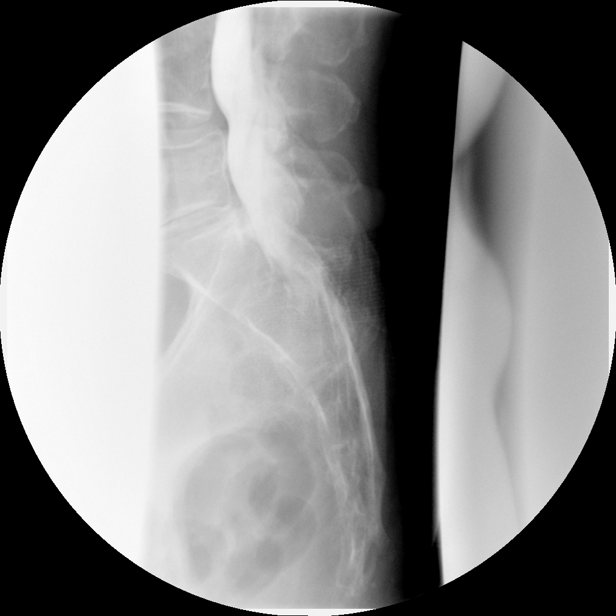

[Series 21: myelogram  white · 1 of 1 slices shown (12 of 12)]
[im 1/1]
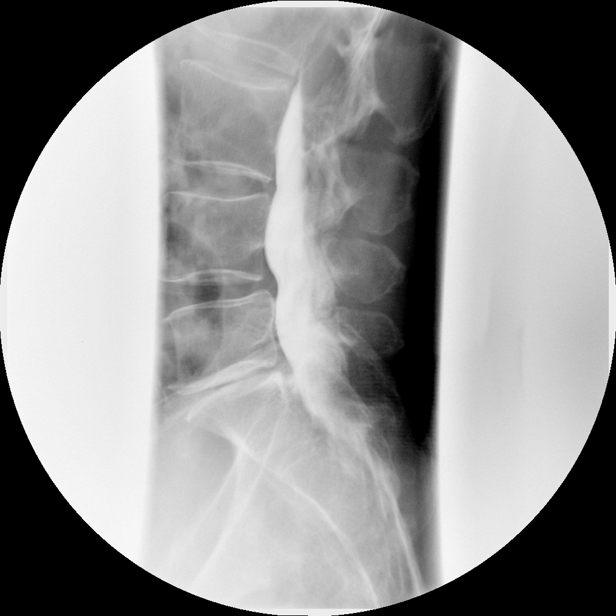

[13 of 21 positions shown; findings below may reference images not displayed]

FINDINGS: The patient has five non-rib bearing lumbar vertebra.  Marked disk height loss at L5-S1.   There is no appreciable listhesis.  Shallow ventral defects seen at L4-5.  
 Segmentation of the coccyx is appreciated. This is more pronounced than on the CT study 08/23/05.  This was not clearly imaged on the CT myelogram study of 04/11/04. 
 Advanced facet degenerative changes are seen at L4-5 and L5-S1.  A mild extradural defect is seen on the left at the L4-5 disk level.  Mild truncation of the left L5 and S1 roots.
IMPRESSION: 1.  Degenerative disk disease L4-5, L5-S1.
 2.  Mild truncation left L5 and S1 roots.
 3.  Segmentation of the lower coccyx.  More pronounced than on the prior exams. 
 POST MYELOGRAM CT SCAN OF THE LUMBAR SPINE:
FINDINGS: On the sagittal reformatted image, advanced degenerative disk disease at L5-S1.   This has a similar appearance to a CT of the lumbar spine 08/23/05.  There is segmentation of the coccyx.  There is slight increased distraction compared with the prior CT study.  No appreciable displacement. This is of questionable significance.  
 Axial imaging:
 L1-2:  Facet degenerative change without central nor foraminal stenosis.
 L2-3:  Mild facet degenerative change.
 L3-4:  Disk bulge without focal protrusion.   Facet degenerative change bilaterally without central nor lateral recess stenosis.
 L4-5:  Laminectomy defect.  Advanced facet degenerative change bilaterally.  Disk bulge eccentric right.  The L4 roots exit encroachment.  There is no appreciable central stenosis.
 L5-S1:  Left hemilaminectomy defect.  Disk bulge eccentric left.  The L5 roots exit without encroachment.  No appreciable central stenosis.
IMPRESSION: 1.  Facet degenerative changes are appreciated at L4-5, L5-S1.  No appreciable central nor foraminal stenosis. 
 2.  Advanced degenerative disk disease L5-S1 similar in appearance to the prior study.  Disk bulge left in conjunction with posterior uncinate spurring and disk height loss results in moderate foraminal narrowing, left greater than right.   Further compounded facet hypertrophy.
 3.  There is segmentation of the coccyx.  This is doubtful significance, however, is slightly more conspicuous on today?s study compared with the 08/23/05 exam with slight increased distraction.

## 2007-09-10 ENCOUNTER — Encounter: Admission: RE | Admit: 2007-09-10 | Discharge: 2007-09-10 | Payer: Self-pay | Admitting: *Deleted

## 2007-10-18 ENCOUNTER — Inpatient Hospital Stay (HOSPITAL_COMMUNITY): Admission: EM | Admit: 2007-10-18 | Discharge: 2007-10-20 | Payer: Self-pay | Admitting: Emergency Medicine

## 2007-10-19 ENCOUNTER — Encounter: Payer: Self-pay | Admitting: Cardiology

## 2007-10-28 IMAGING — CR DG CHEST 1V PORT
1 series · 1 of 1 positions shown · non-contrast
Comparison: 12/07/2004.

CLINICAL DATA: Palpitations. 
 PORTABLE CHEST - 1 VIEW:

[view not recorded]
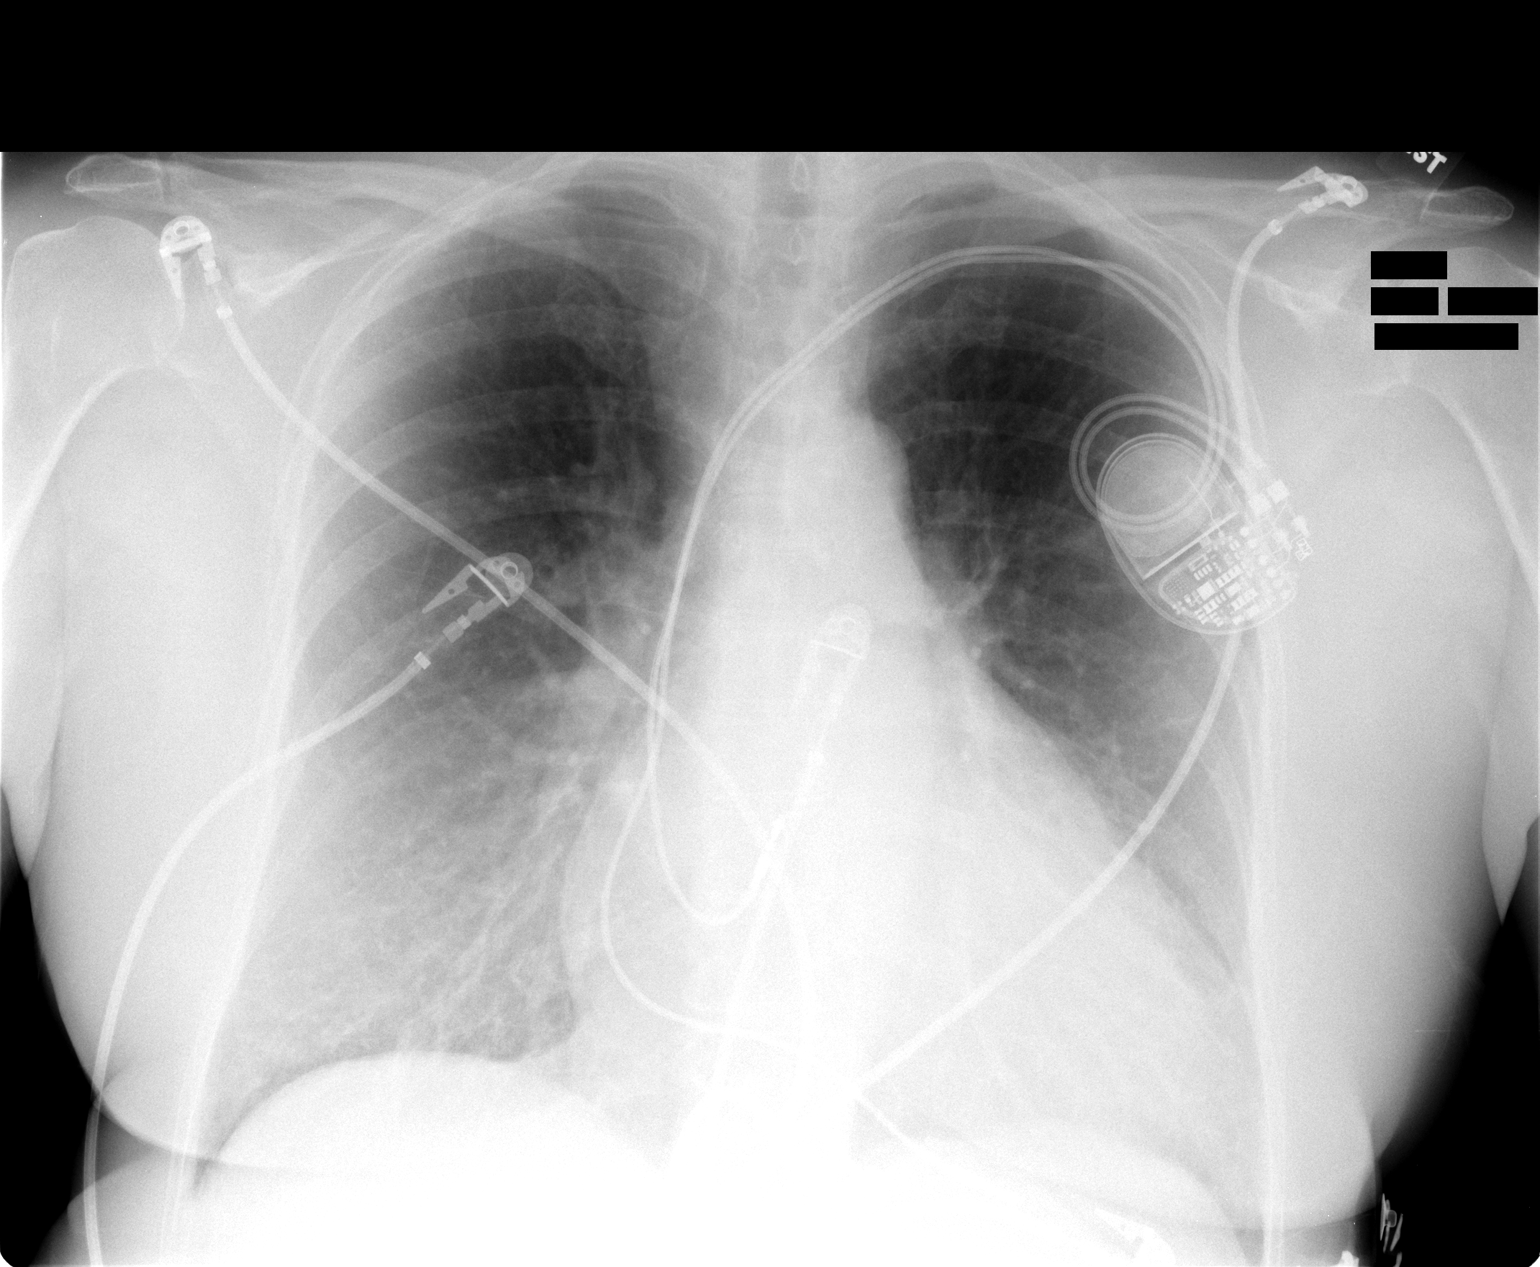

[1 of 1 positions shown; findings below may reference images not displayed]

FINDINGS: Left-sided ICD is noted with leads in the projection of the right atrial appendage and right ventricle.  Heart size is enlarged.  There are dependent changes identified at both lung bases.  No pneumonia.  No evidence for failure.
IMPRESSION: 1.  Cardiomegaly but no failure.
 2.  Dependent changes at both lung bases.

## 2008-01-15 ENCOUNTER — Encounter: Admission: RE | Admit: 2008-01-15 | Discharge: 2008-01-15 | Payer: Self-pay | Admitting: Gynecology

## 2008-04-13 ENCOUNTER — Encounter (INDEPENDENT_AMBULATORY_CARE_PROVIDER_SITE_OTHER): Payer: Self-pay | Admitting: Gastroenterology

## 2008-04-13 ENCOUNTER — Ambulatory Visit (HOSPITAL_COMMUNITY): Admission: RE | Admit: 2008-04-13 | Discharge: 2008-04-13 | Payer: Self-pay | Admitting: Gastroenterology

## 2008-04-19 IMAGING — CR DG ABDOMEN 2V
3 series · 3 of 3 positions shown · non-contrast
Comparison: none

CLINICAL DATA: Abdominal pain and distention. 
 ABDOMEN TWO VIEWS:
 Supine and erect views of the abdomen show no bowel obstruction and no free air.  Pacer leads are noted.  No opaque calculi are seen.

[w abdomen upright * (1 of 2)]
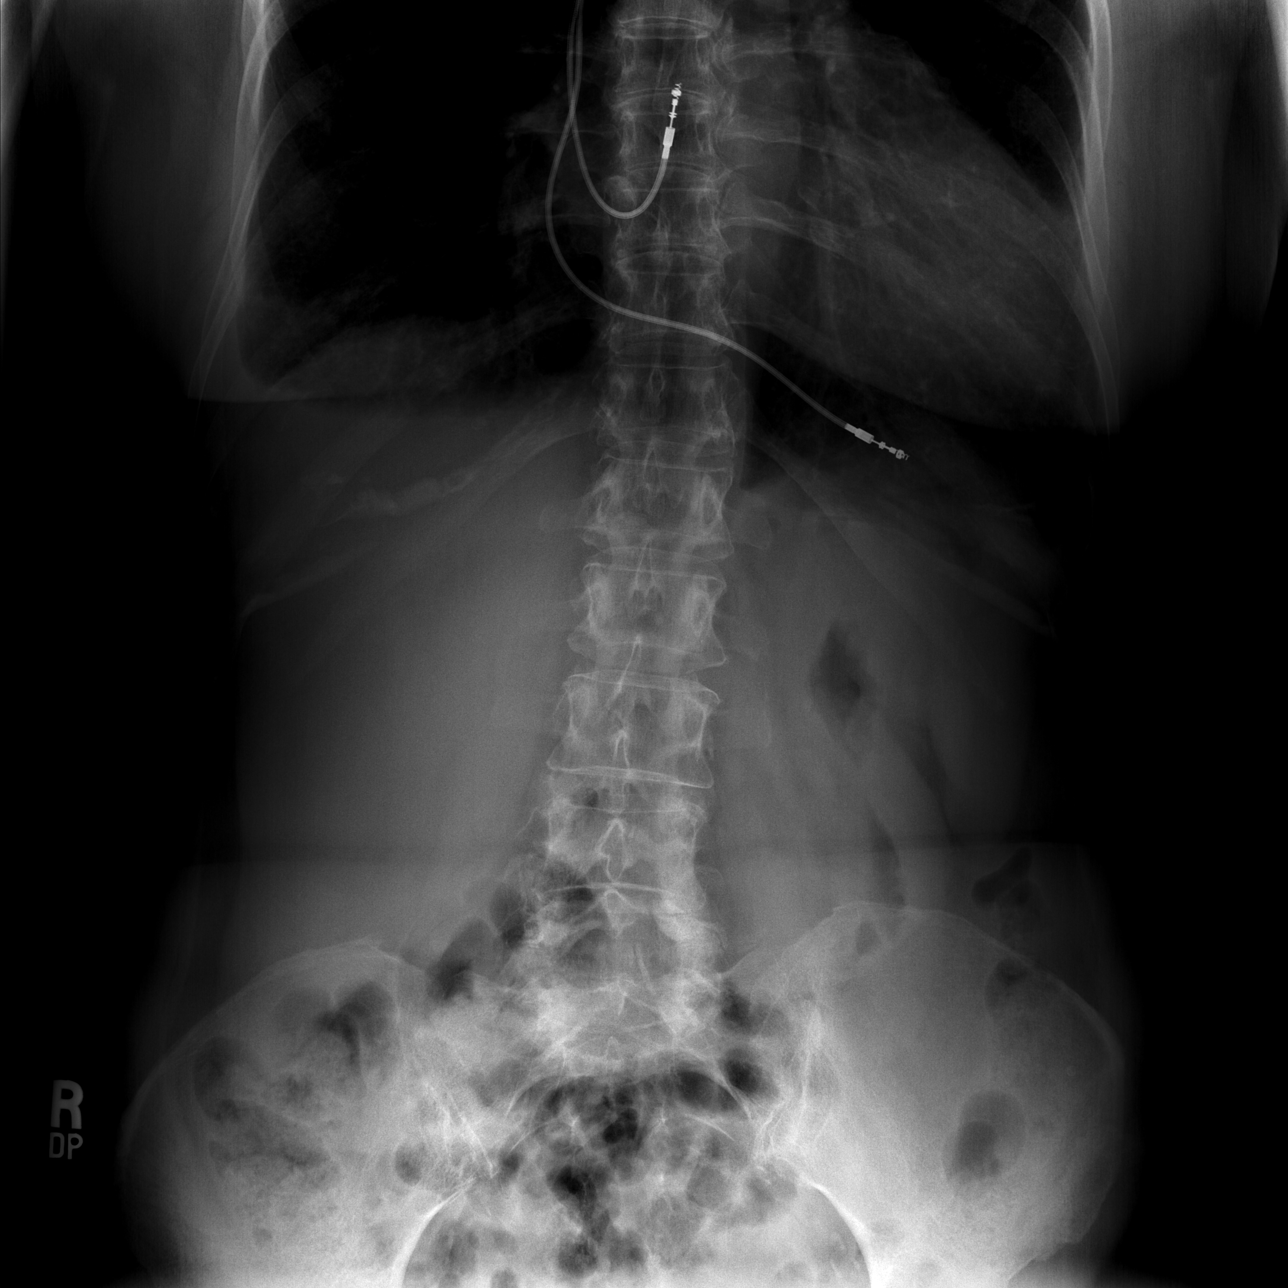

[t abdomen supine]
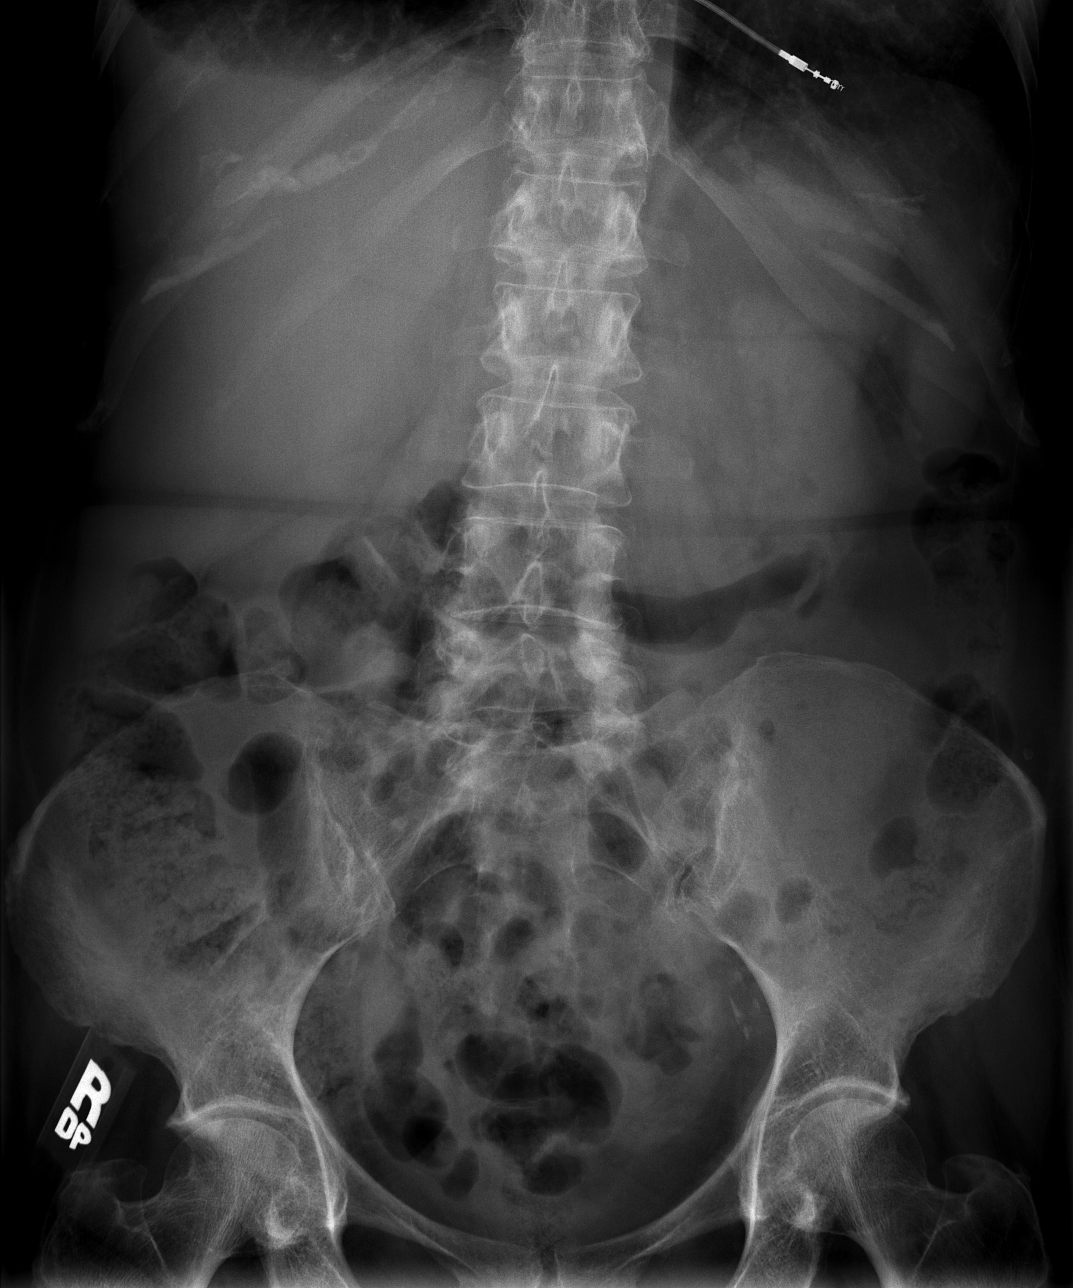

[w abdomen upright * (2 of 2)]
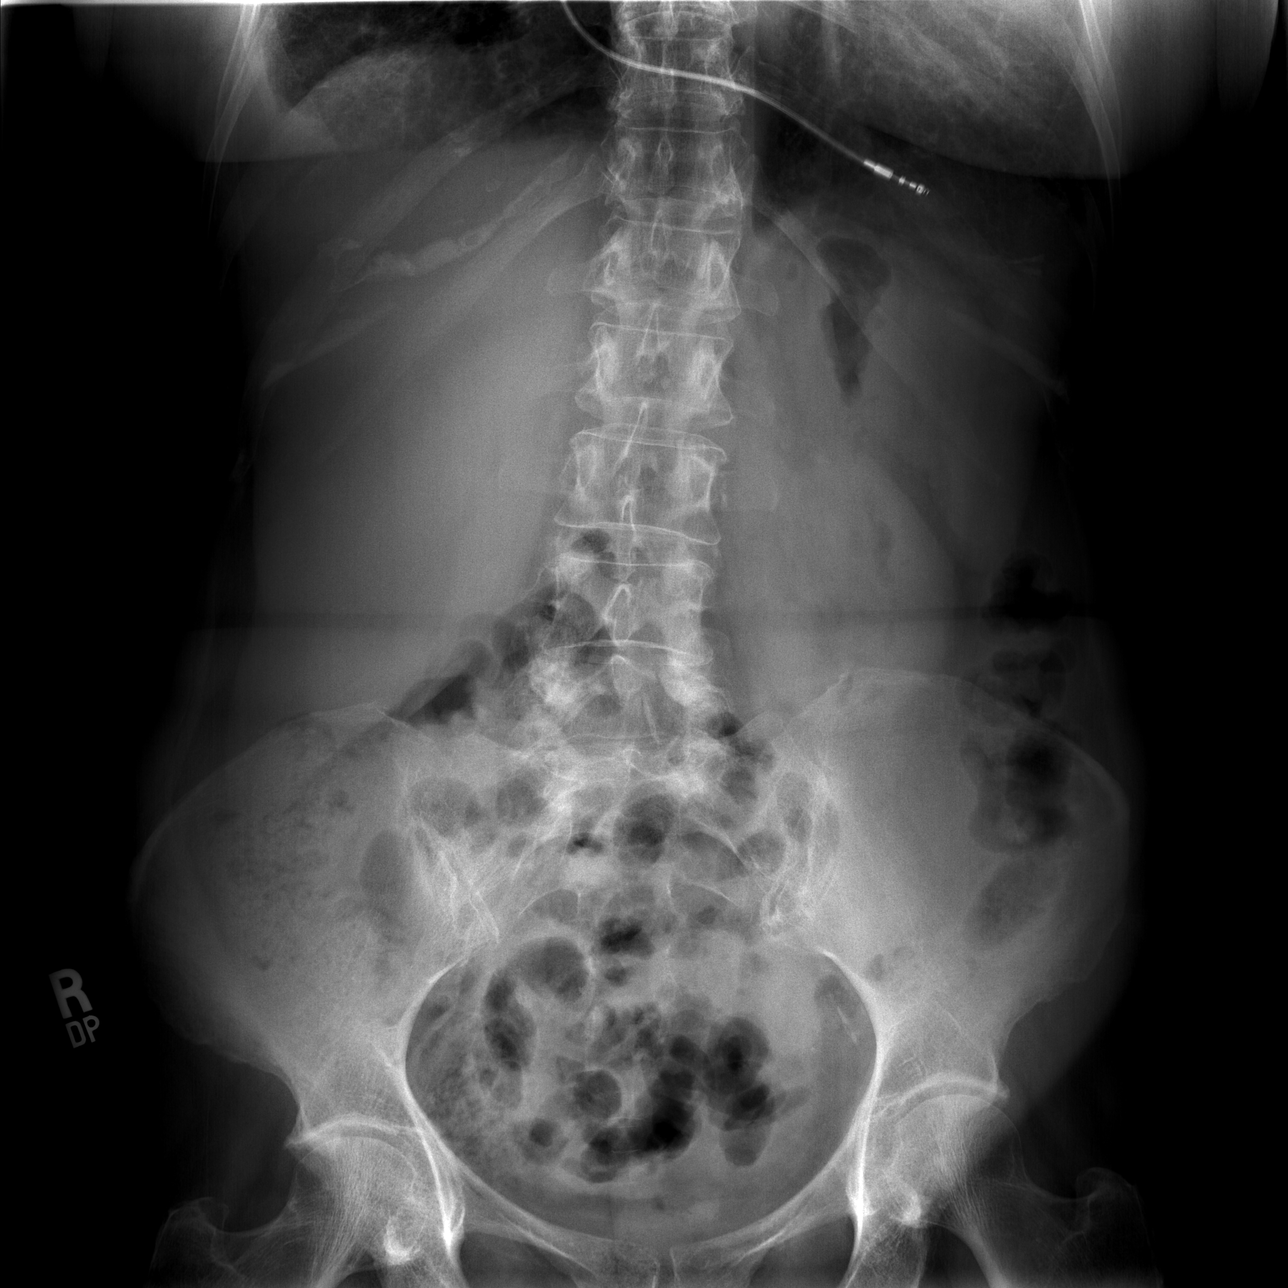

[3 of 3 positions shown; findings below may reference images not displayed]

IMPRESSION: No obstruction or free air.

## 2008-04-27 ENCOUNTER — Inpatient Hospital Stay (HOSPITAL_COMMUNITY): Admission: EM | Admit: 2008-04-27 | Discharge: 2008-04-29 | Payer: Self-pay | Admitting: Family Medicine

## 2008-08-12 ENCOUNTER — Encounter: Admission: RE | Admit: 2008-08-12 | Discharge: 2008-08-12 | Payer: Self-pay | Admitting: Gynecology

## 2008-11-21 IMAGING — CT CT PELVIS W/O CM
2 of 4 series · 17 of 46 positions shown, 19 images · IV contrast (READICAT/WATER)
Comparison: 12/18/05.

CLINICAL DATA: Epigastric pain and fullness.  
 ABDOMEN CT WITHOUT CONTRAST:
TECHNIQUE: Multidetector CT imaging of the abdomen was performed following the standard protocol without IV contrast.
TECHNIQUE: Multidetector CT imaging of the pelvis was performed following the standard protocol without IV contrast.

[Series 3: routine abdomen · axial · 0.66mm/px · z∈[-409,-54]mm · 14 of 79 slices shown, 16 images]
[im 4/79  soft-tissue]
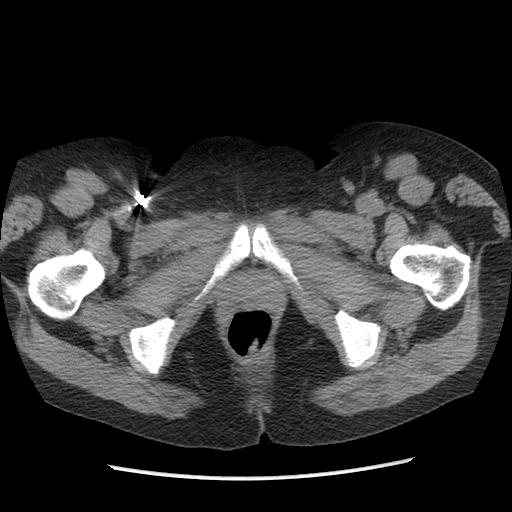
[im 4/79  bone]
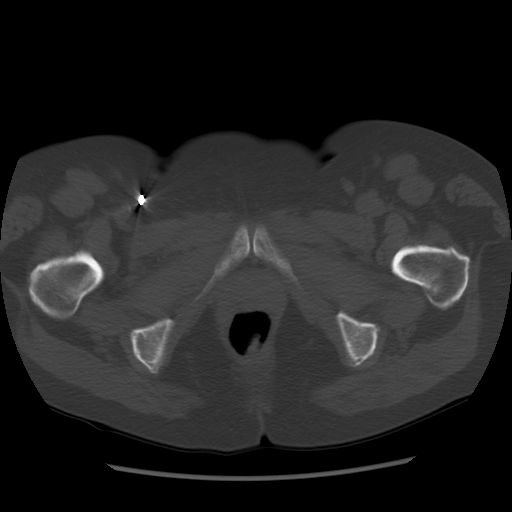
[im 10/79  soft-tissue]
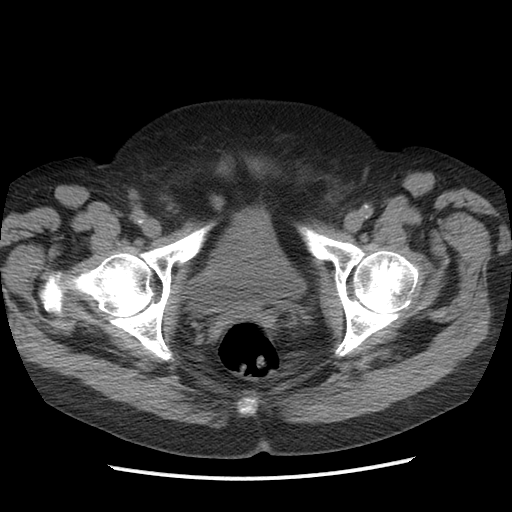
[im 16/79  soft-tissue]
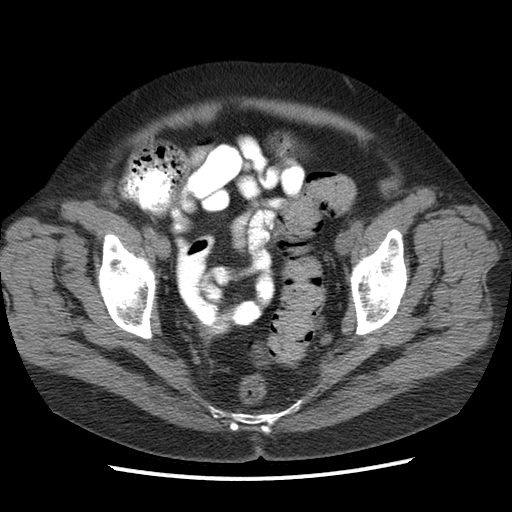
[im 22/79  soft-tissue]
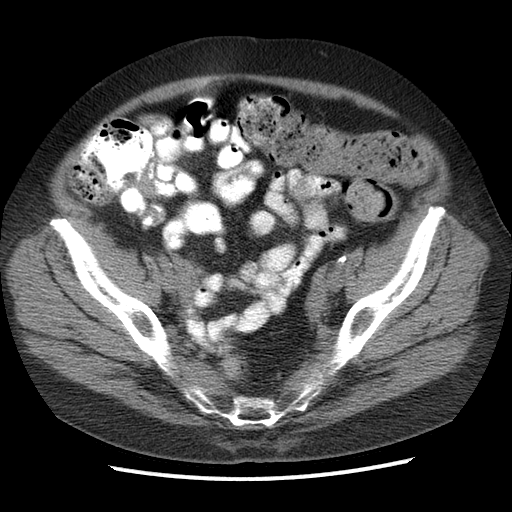
[im 25/79  soft-tissue]
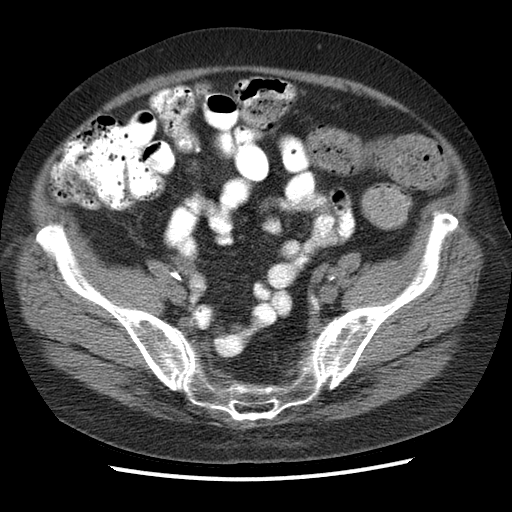
[im 32/79  soft-tissue]
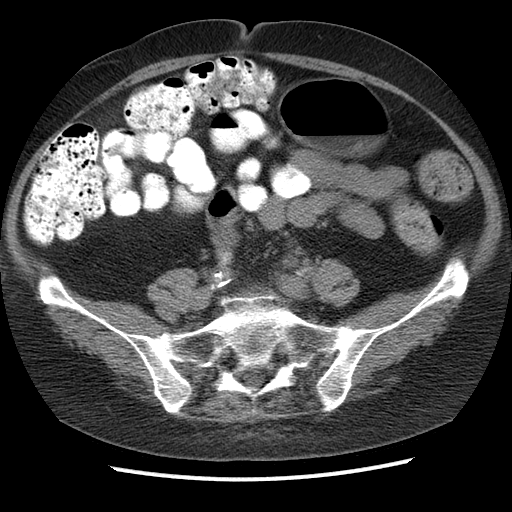
[im 38/79  soft-tissue]
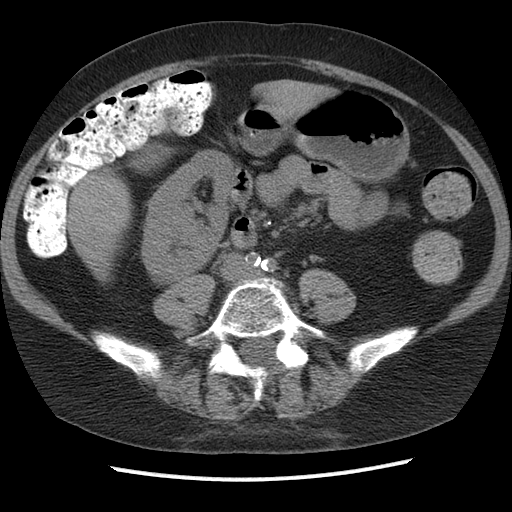
[im 41/79  soft-tissue]
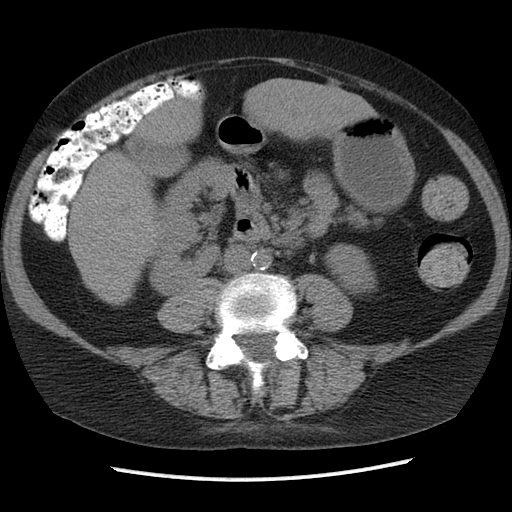
[im 47/79  soft-tissue]
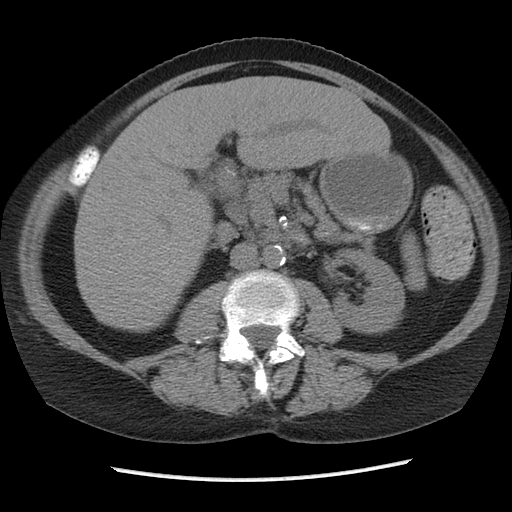
[im 47/79  bone]
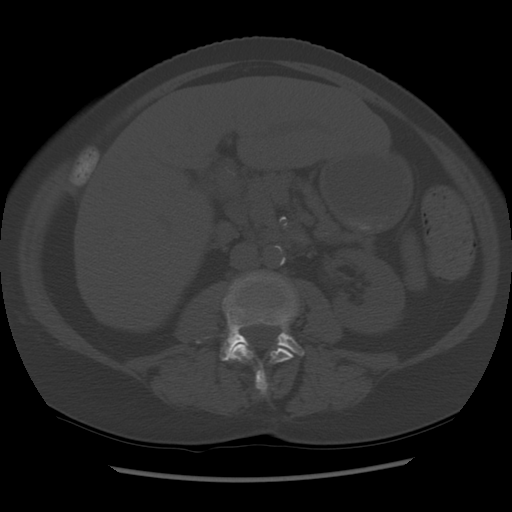
[im 54/79  soft-tissue]
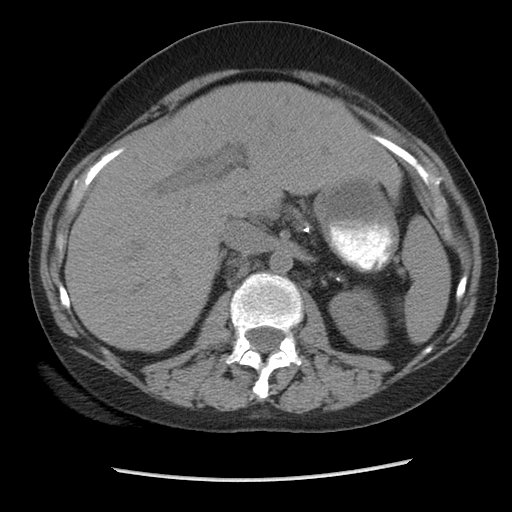
[im 60/79  soft-tissue]
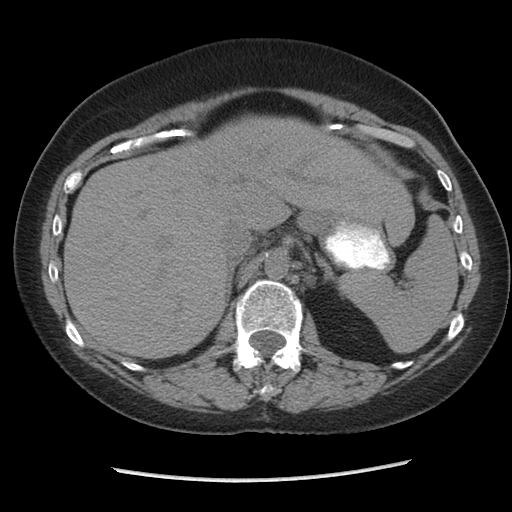
[im 63/79  soft-tissue]
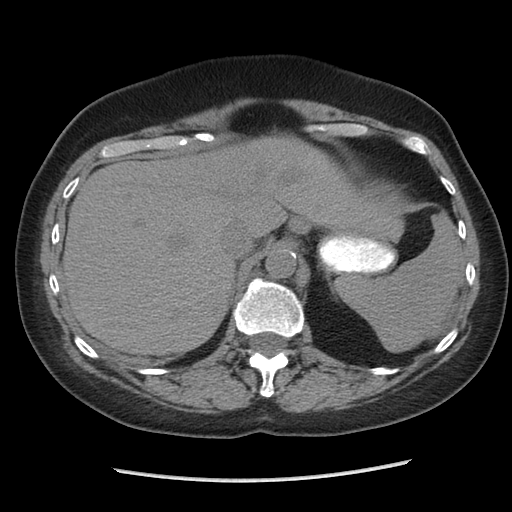
[im 69/79  soft-tissue]
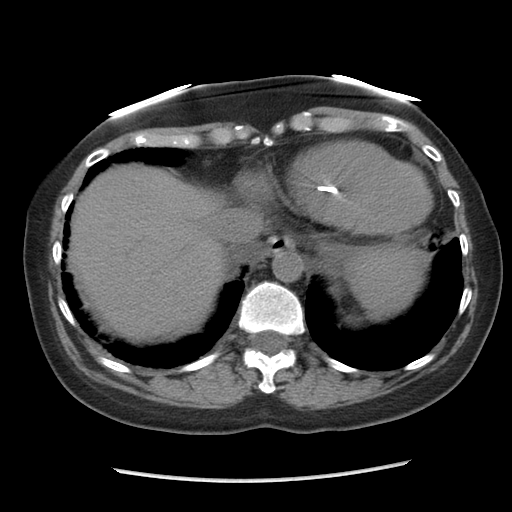
[im 75/79  soft-tissue]
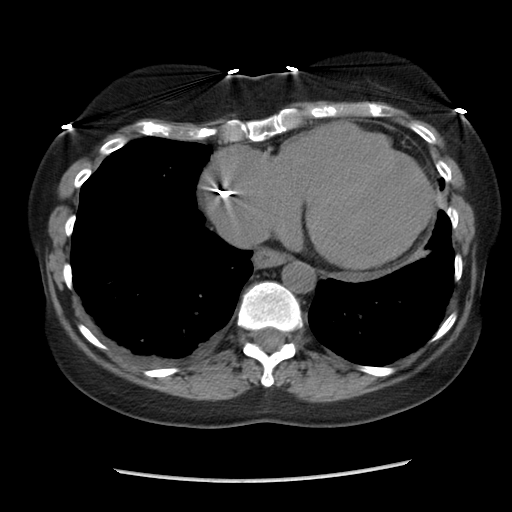

[Series 602: sagittal body · sagittal · 0.85mm/px · 3 of 136 slices shown]
[im 46/136  soft-tissue]
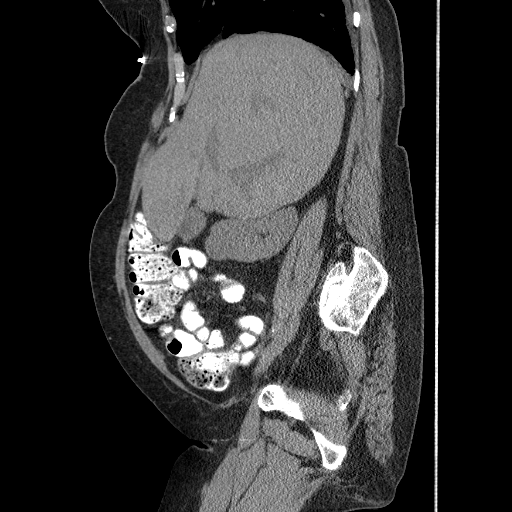
[im 61/136  soft-tissue]
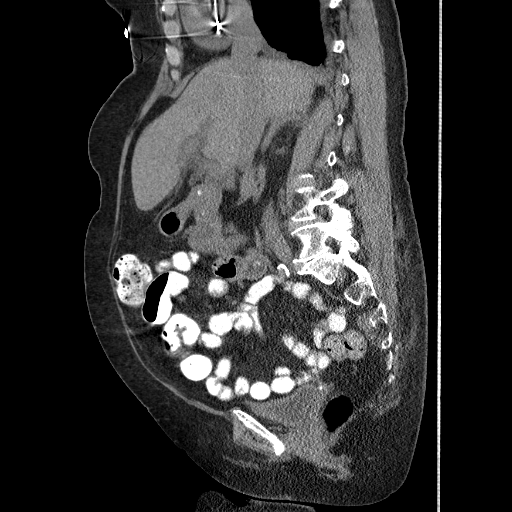
[im 76/136  soft-tissue]
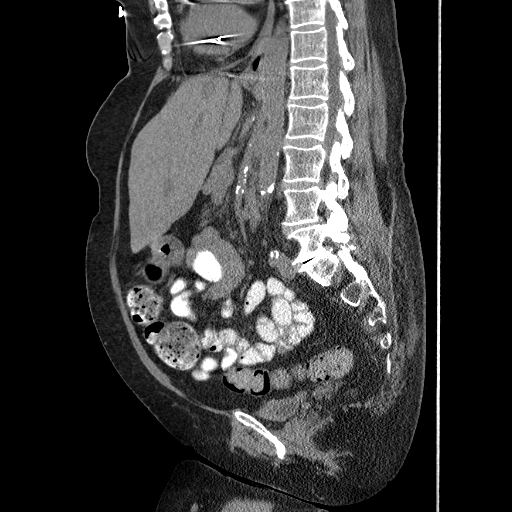

[17 of 46 positions shown; findings below may reference images not displayed]

FINDINGS: Small right pleural effusion.  Heart size is enlarged.  No pericardial fluid. 
 Liver margin is somewhat nodular.  There may be mild periportal edema or intrahepatic biliary duct dilatation.  Gallbladder, adrenal glands unremarkable.  Right kidney is nonrotated.  Left kidney, spleen, pancreas, stomach, and small bowel unremarkable.  Atherosclerotic calcification of the arterial vasculature.  Retroperitoneal lymph nodes are not enlarged by CT size criteria.
IMPRESSION: 1.  Small right pleural effusion.  
 2.  Mild nodularity of the liver margin can be seen with sclerosis.  Hepatomegaly.  
 PELVIS CT WITHOUT CONTRAST:
FINDINGS: Colon unremarkable.  Uterus is absent.  Ovaries are visualized.  No pathologically enlarged lymph nodes.  No free fluid.  No worrisome lytic or sclerotic lesions.
IMPRESSION: No acute findings.

## 2008-12-16 IMAGING — CR DG CHEST 2V
2 series · 2 of 2 positions shown · non-contrast
Comparison: none

CLINICAL DATA: Several months shortness of breath.  Progressive recently.  History of hypertension.  Pacemaker.  Previous smoker.
DIAGNOSTIC CHEST - 2 VIEW:

[w chest pa]
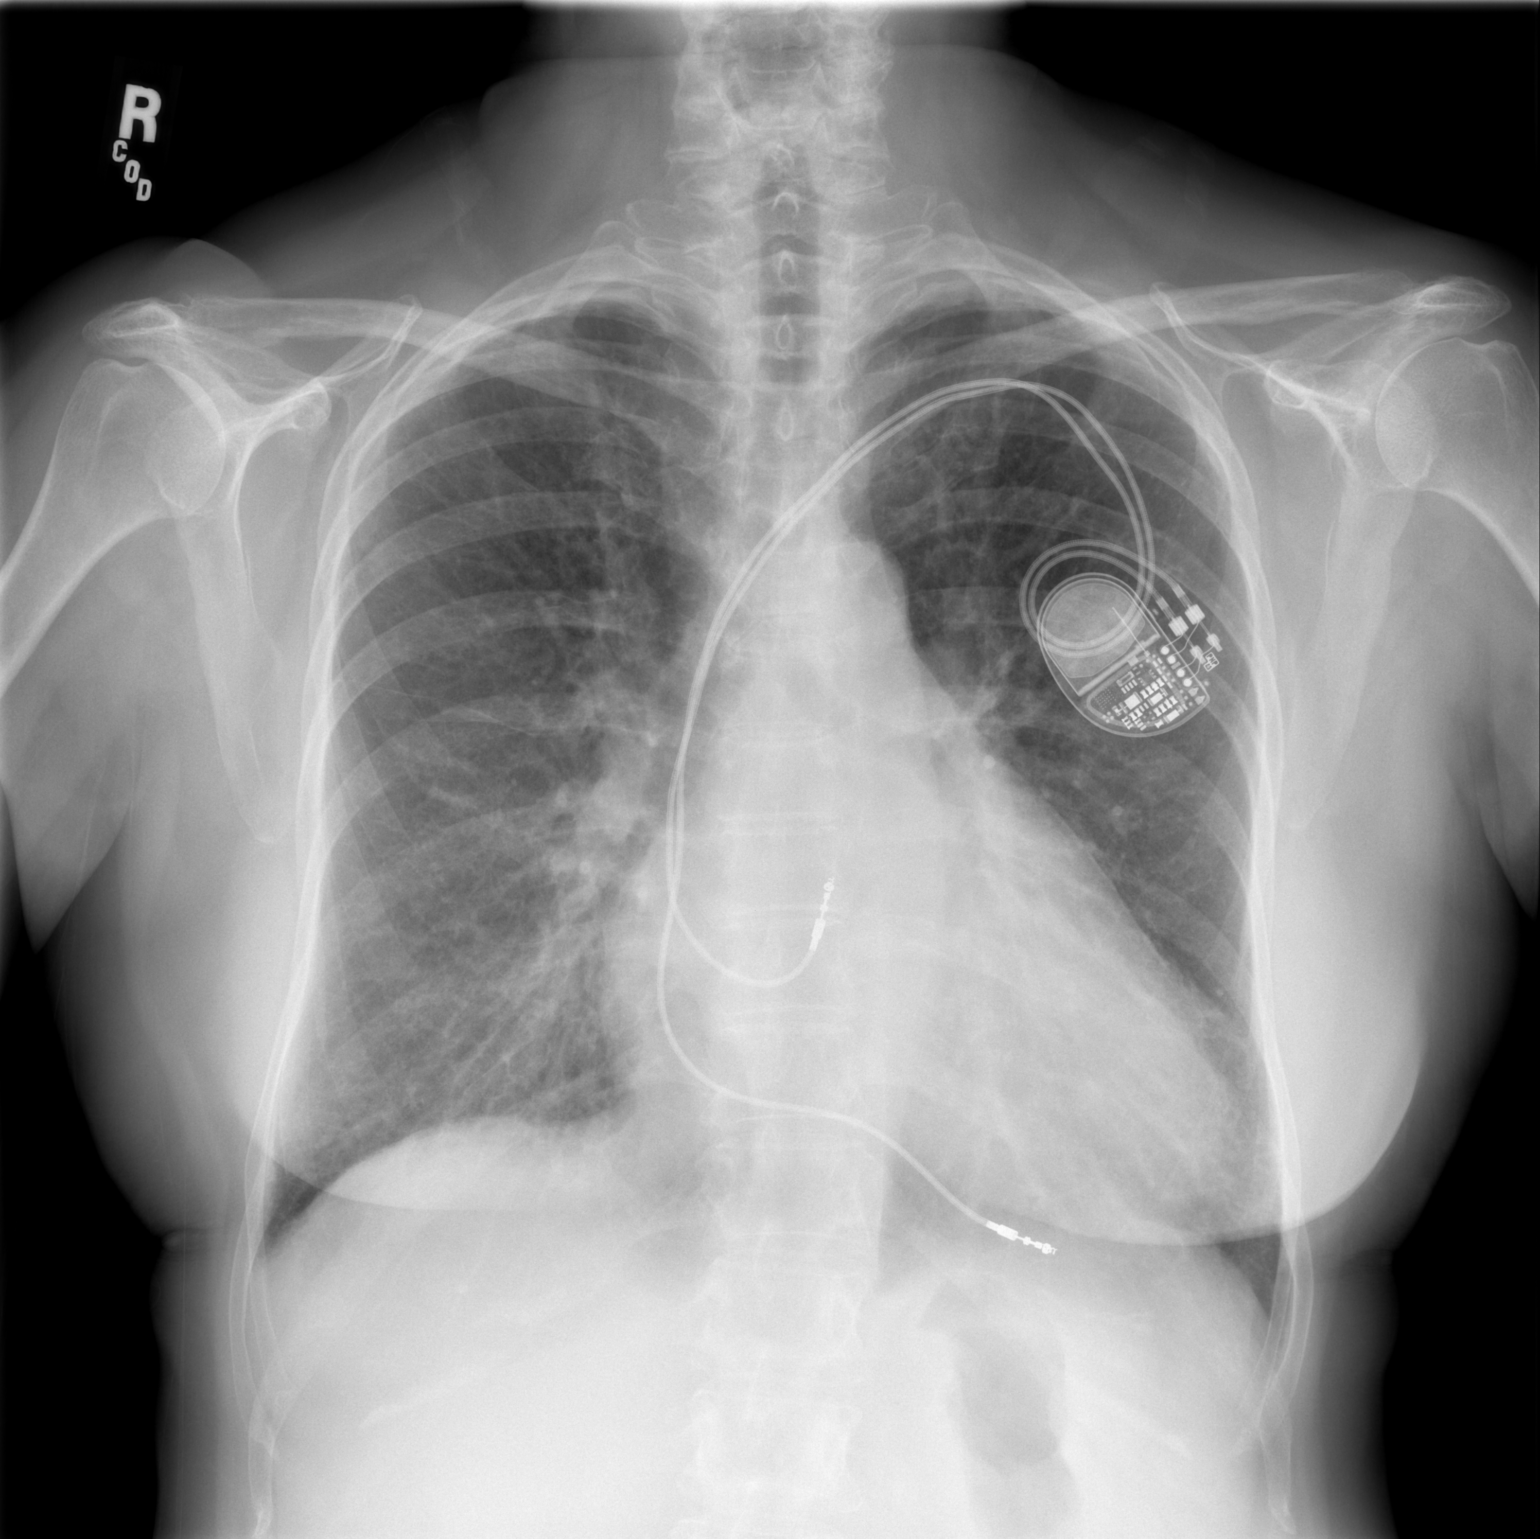

[w chest lat]
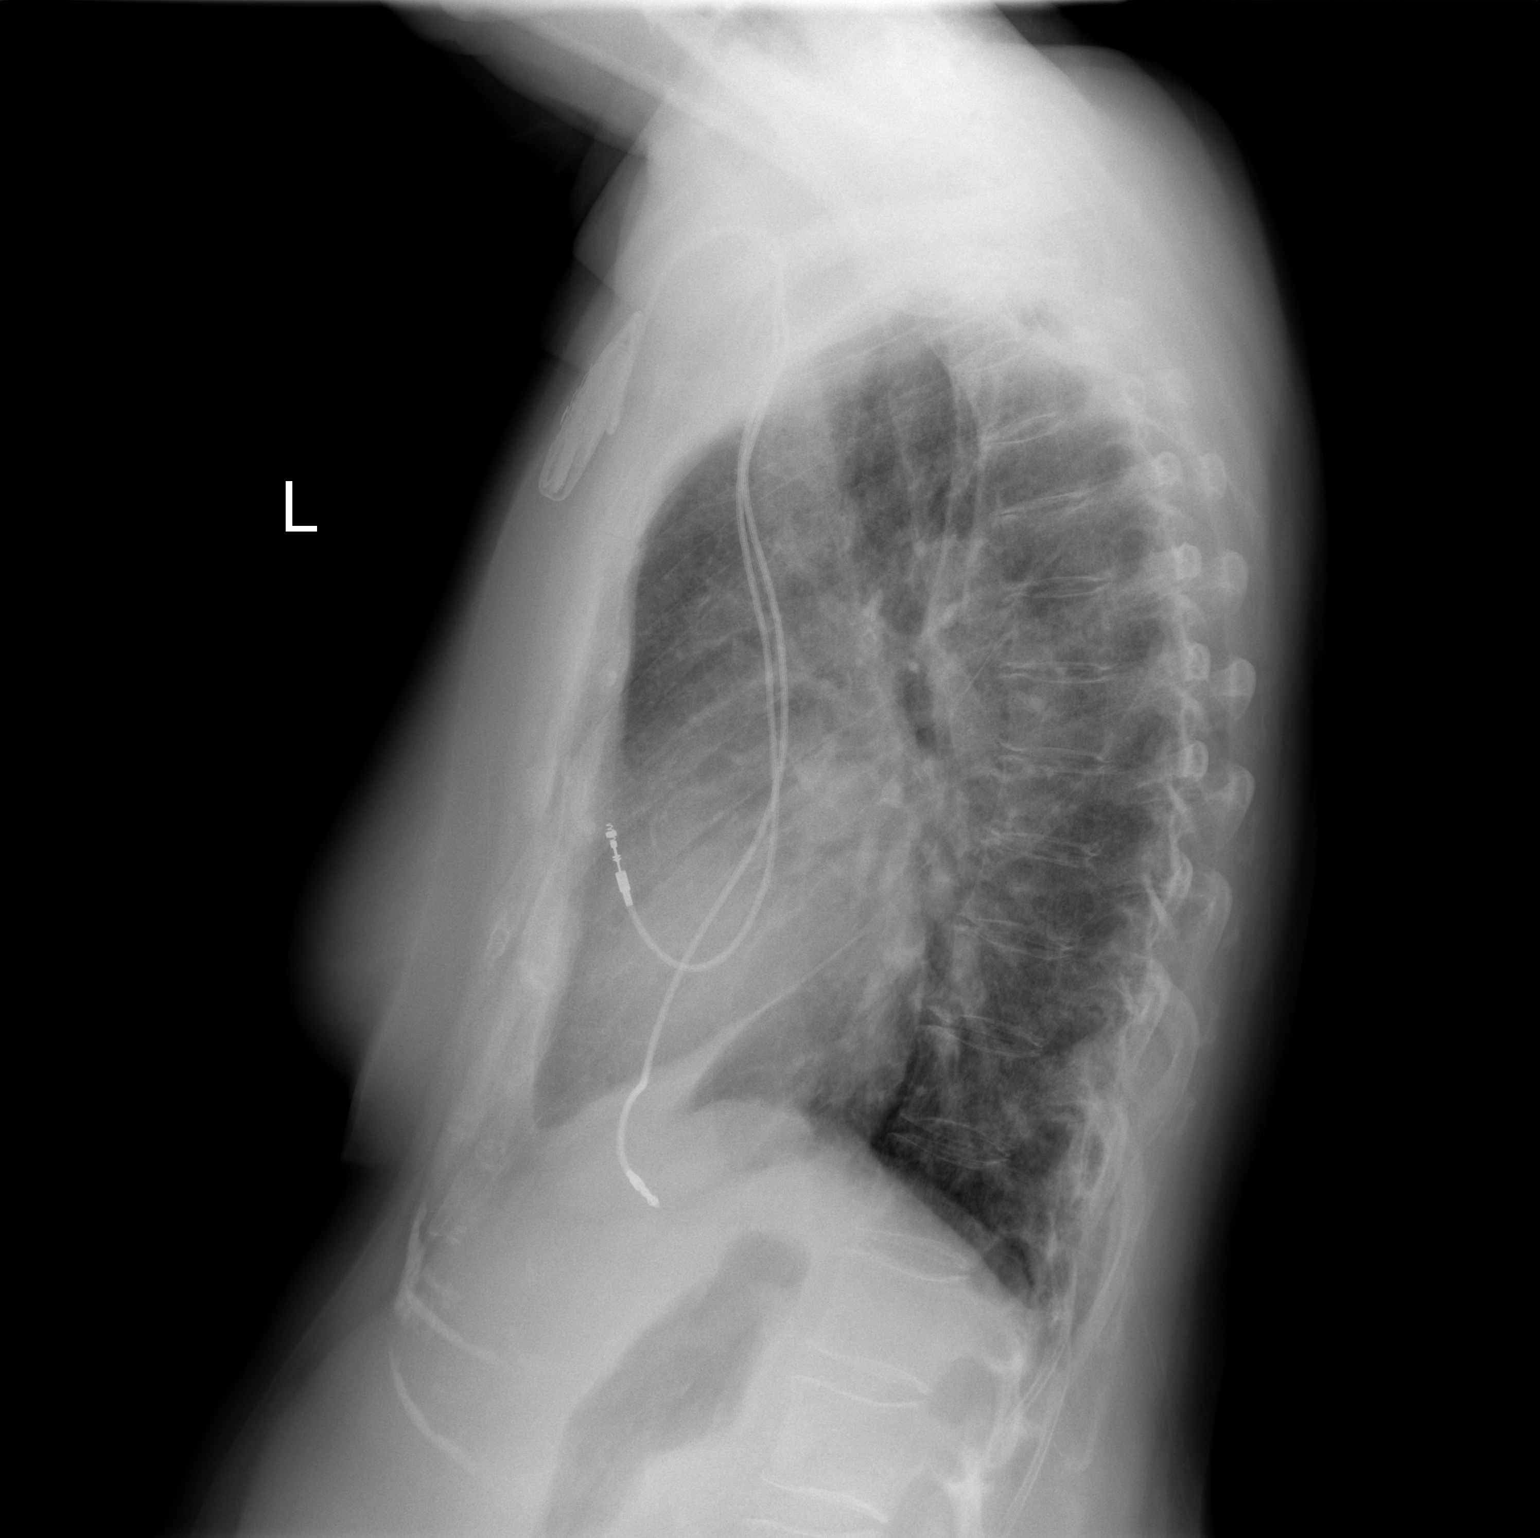

[2 of 2 positions shown; findings below may reference images not displayed]

FINDINGS: Since 12/07/04 slight pulmonary venous hypertension findings with redistribution of vascularity to the upper lung fields noted.  Lungs are otherwise clear with no edema seen.  No change in dual lead left-sided permanent cardiac pacemaker since 04/30/06.  No other new abnormality is seen.
IMPRESSION: 1.  Interval pulmonary venous hypertension findings since 12/07/04 with no other pulmonary edema/active congestive heart failure findings. 
2.  Stable cardiomegaly with left-sided dual lead cardiac pacemaker since 04/30/06. 
[DATE].  Otherwise no new abnormality seen.

## 2009-01-19 ENCOUNTER — Encounter: Payer: Self-pay | Admitting: Internal Medicine

## 2009-01-24 ENCOUNTER — Encounter: Admission: RE | Admit: 2009-01-24 | Discharge: 2009-01-24 | Payer: Self-pay | Admitting: Family Medicine

## 2009-03-09 IMAGING — RF DG UGI W/ HIGH DENSITY W/KUB
18 of 24 series · 18 of 24 positions shown · non-contrast
Comparison: none

CLINICAL DATA: Abdominal pain particularly right upper quadrant. 
 UPPER GI WITH KUB AND HIGH DENSITY:
 A preliminary film of the abdomen shows a non specific bowel gas pattern. 
 Double contrast upper GI was performed.  The mucosa of the esophagus appears normal.  Single contrast study shows the swallowing mechanism to be normal.  There is a small sliding hiatal hernia but no reflux is seen.  The stomach is normal in contour and peristalsis.   The duodenal bulb fills well with no ulceration and the duodenal loop is in normal position.

[Series 4: run · 1 of 1 slices shown (1 of 17)]
[im 1/1]
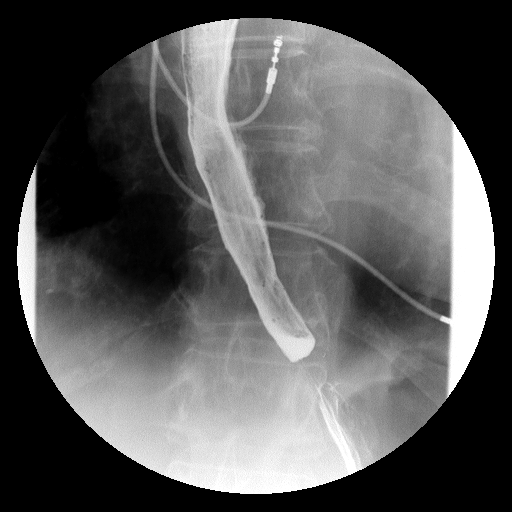

[Series 6: run · 1 of 1 slices shown (2 of 17)]
[im 1/1]
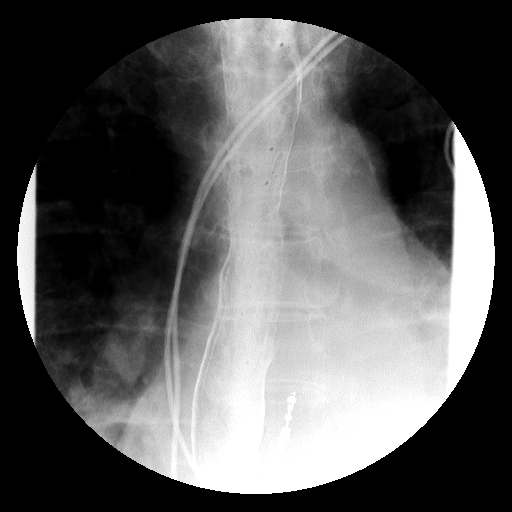

[Series 7: run · 1 of 1 slices shown (3 of 17)]
[im 1/1]
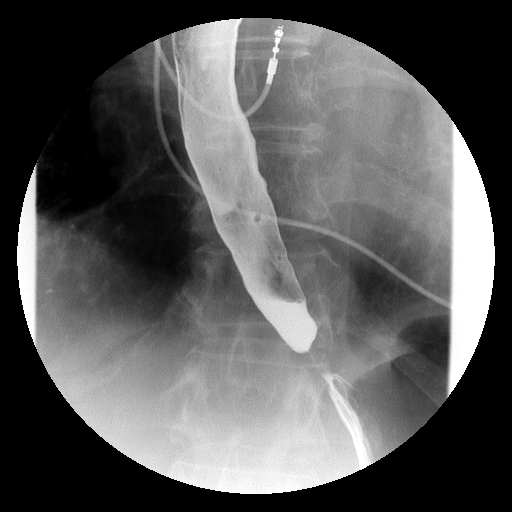

[Series 8: run · 1 of 1 slices shown (4 of 17)]
[im 1/1]
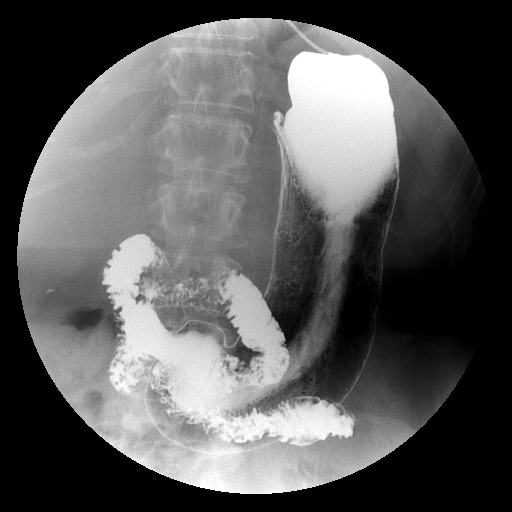

[Series 10: run · 1 of 1 slices shown (5 of 17)]
[im 1/1]
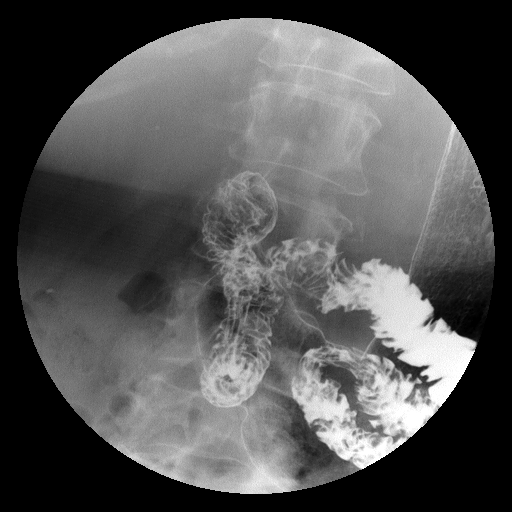

[Series 11: run · 1 of 1 slices shown (6 of 17)]
[im 1/1]
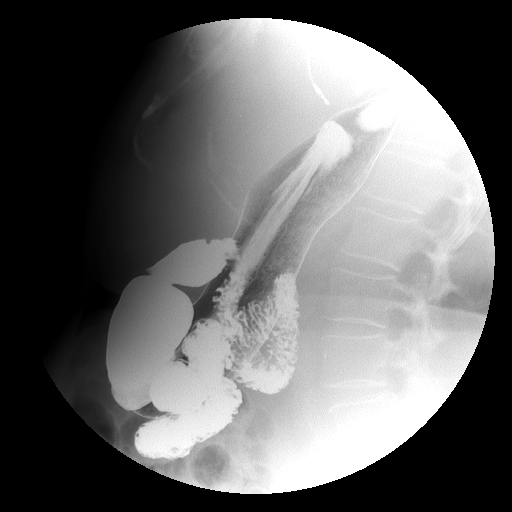

[Series 12: run · 1 of 1 slices shown (7 of 17)]
[im 1/1]
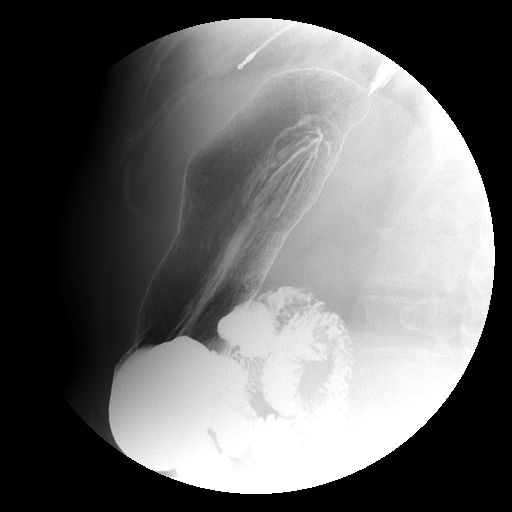

[Series 14: run · 1 of 1 slices shown (8 of 17)]
[im 1/1]
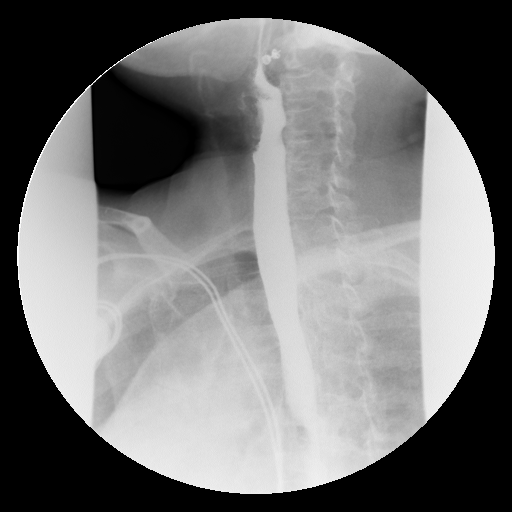

[Series 15: run · 1 of 1 slices shown (9 of 17)]
[im 1/1]
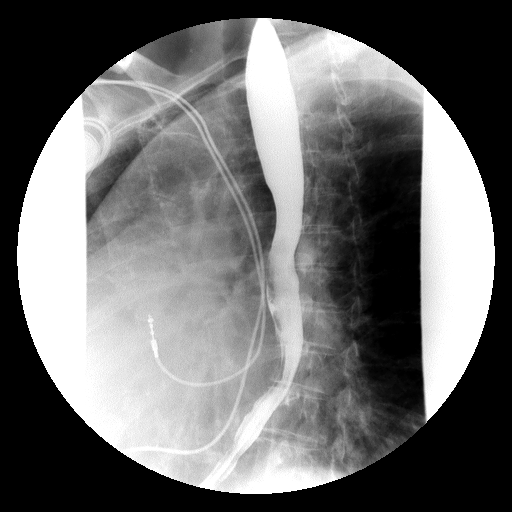

[Series 16: run · 1 of 1 slices shown (10 of 17)]
[im 1/1]
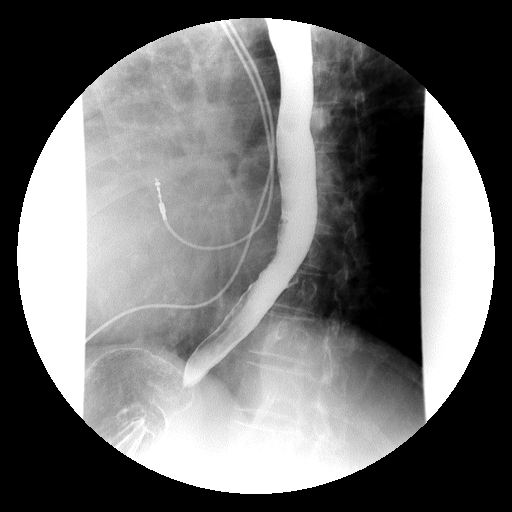

[Series 18: run · 1 of 1 slices shown (11 of 17)]
[im 1/1]
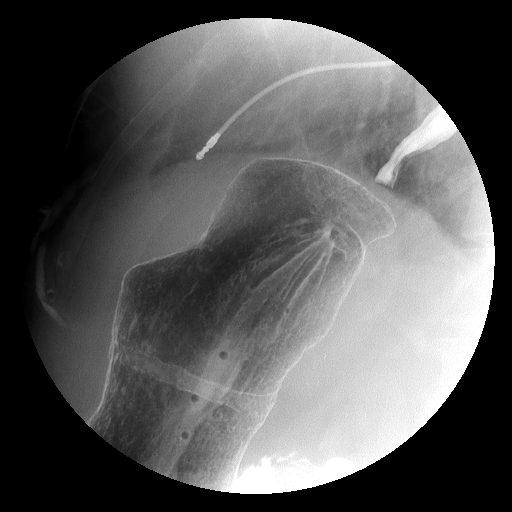

[Series 19: run · 1 of 1 slices shown (12 of 17)]
[im 1/1]
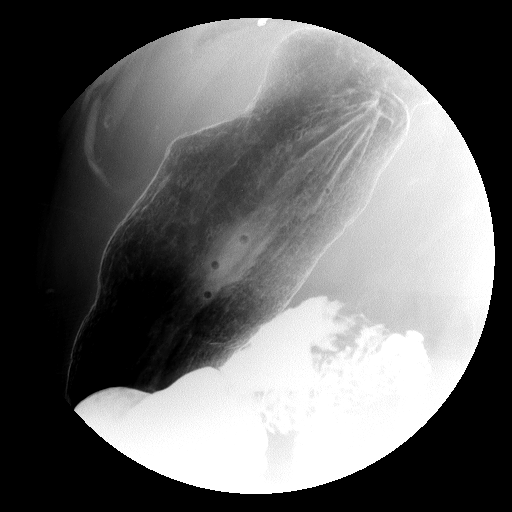

[Series 20: run · 1 of 1 slices shown (13 of 17)]
[im 1/1]
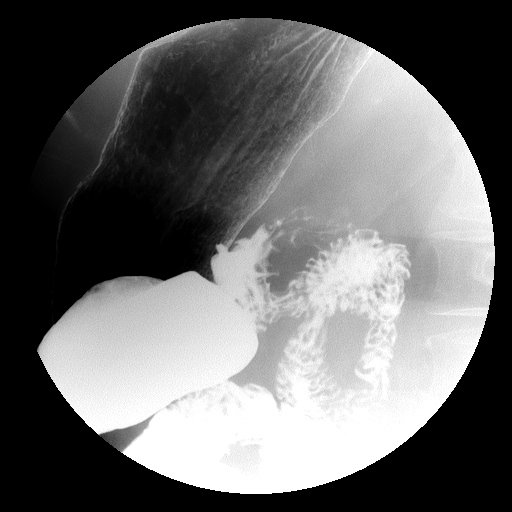

[Series 22: run · 1 of 1 slices shown (14 of 17)]
[im 1/1]
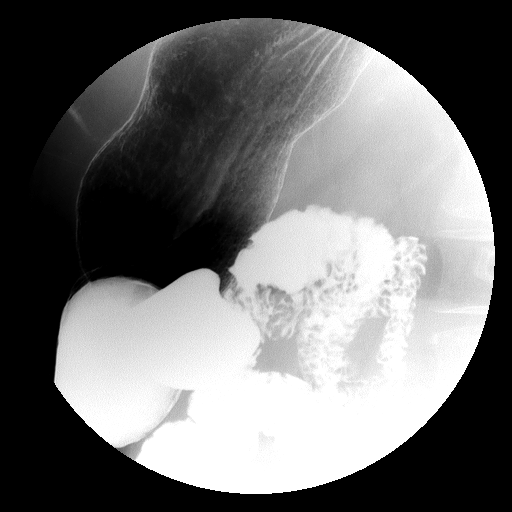

[Series 23: run · 1 of 1 slices shown (15 of 17)]
[im 1/1]
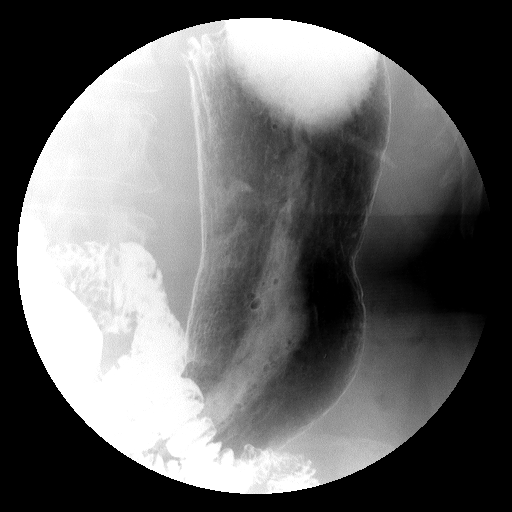

[Series 24: run · 1 of 1 slices shown (16 of 17)]
[im 1/1]
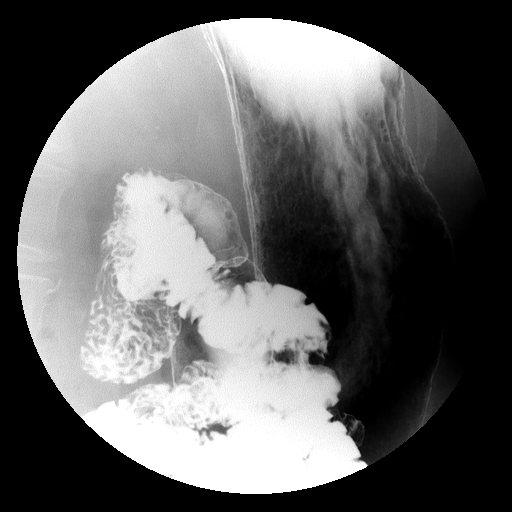

[Series 26: run · 1 of 1 slices shown (17 of 17)]
[im 1/1]
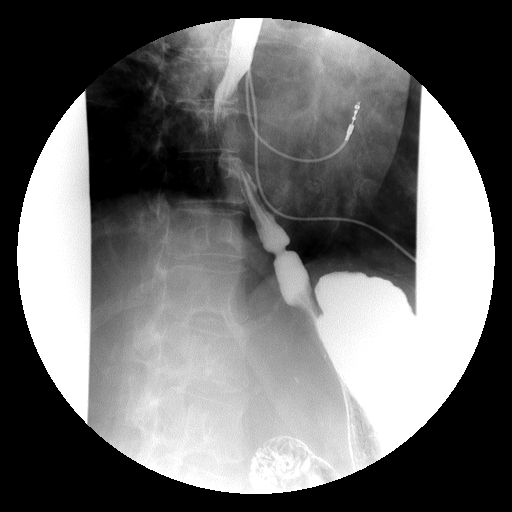

[Series 1001: view not recorded · 0.20mm/px · 1 of 1 slices shown]
[im 1/1]
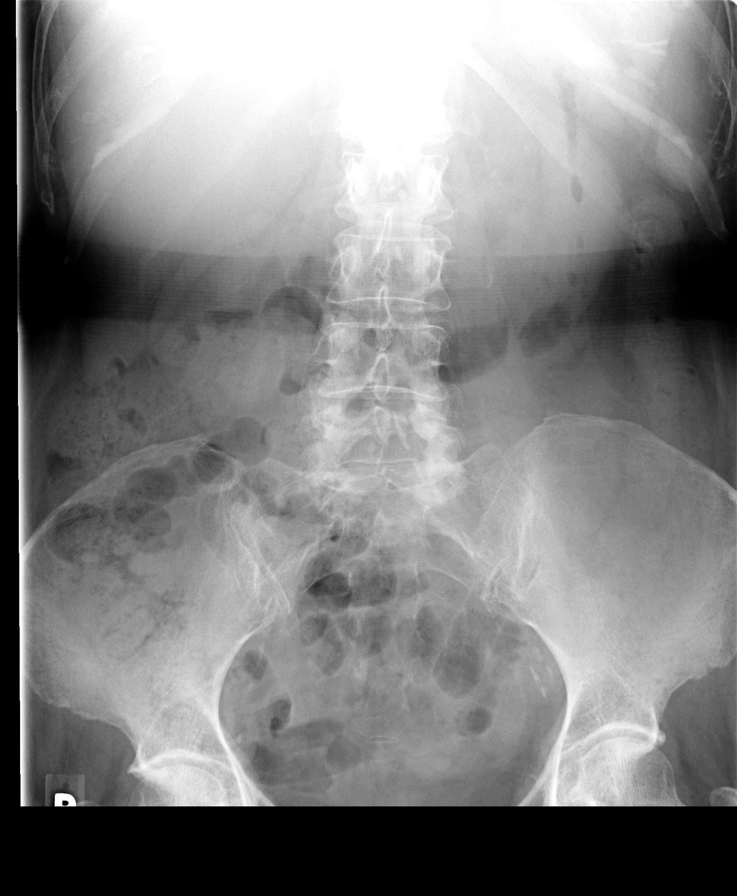

[18 of 24 positions shown; findings below may reference images not displayed]

IMPRESSION: Small sliding hiatal hernia.  No definite reflux.

## 2009-03-09 IMAGING — US US ABDOMEN COMPLETE
1 series · 14 of 25 positions shown · non-contrast
Comparison: none

CLINICAL DATA: Abdominal pain particularly right upper quadrant. 
 ABDOMEN ULTRASOUND:
TECHNIQUE: Complete abdominal ultrasound examination was performed including evaluation of the liver, gallbladder, bile ducts, pancreas, kidneys, spleen, IVC, and abdominal aorta.

[Series 1: unknown · 0.33mm/px · 14 of 87 slices shown]
[im 1/87]
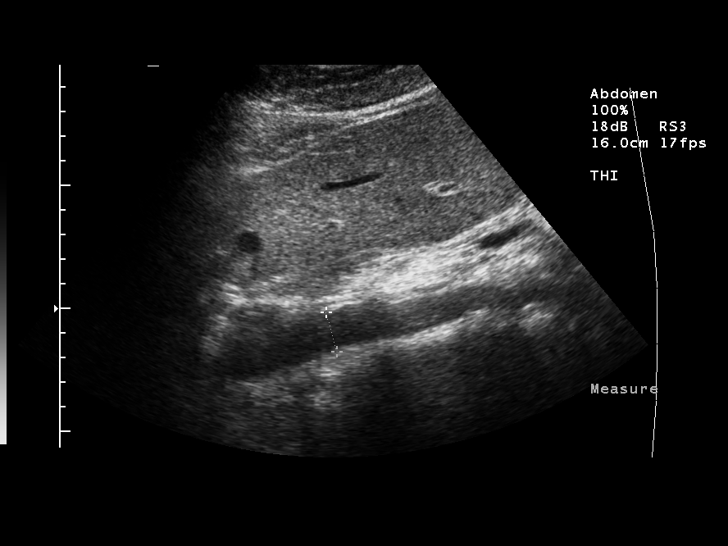
[im 8/87]
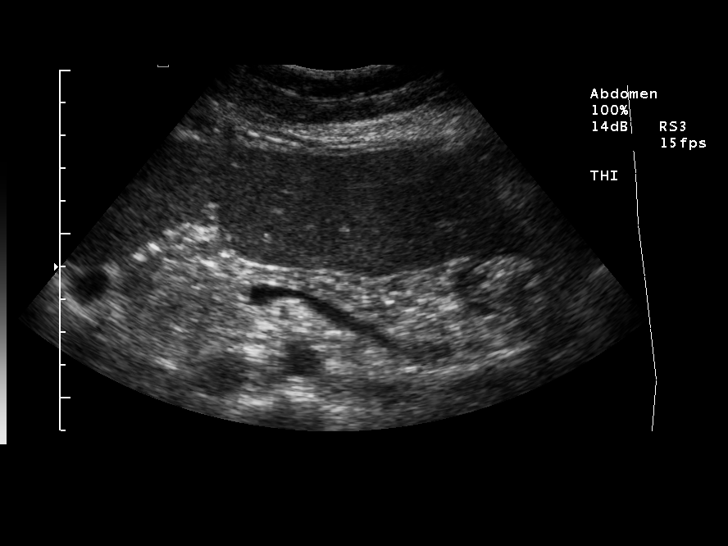
[im 15/87]
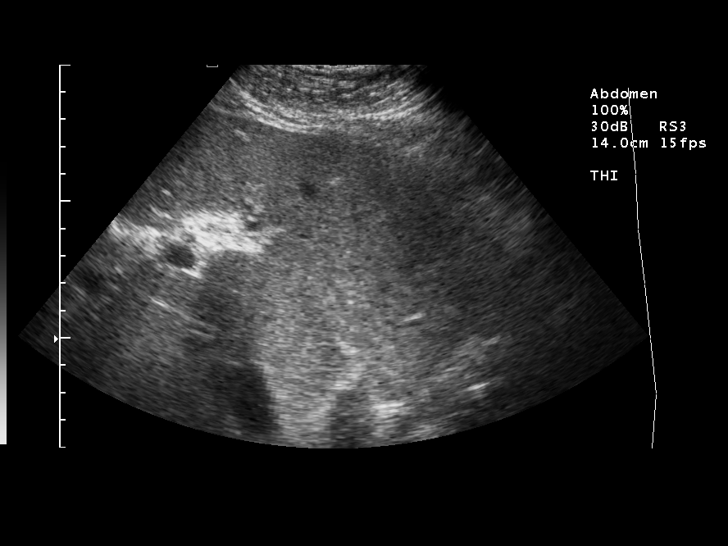
[im 22/87]
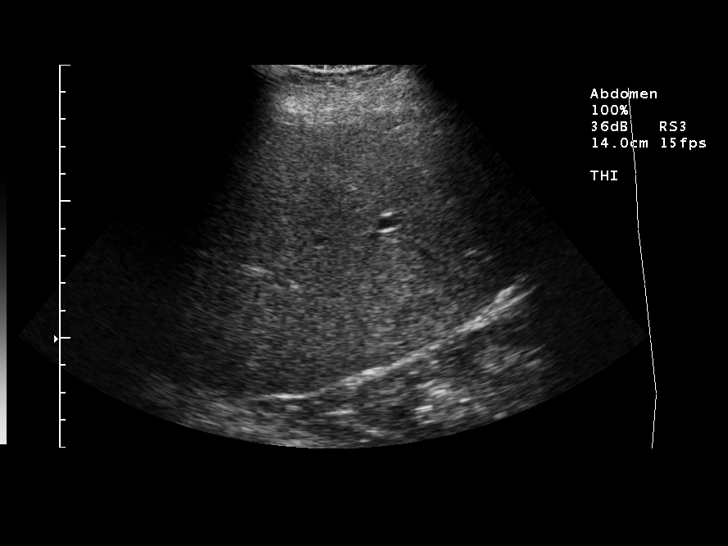
[im 29/87]
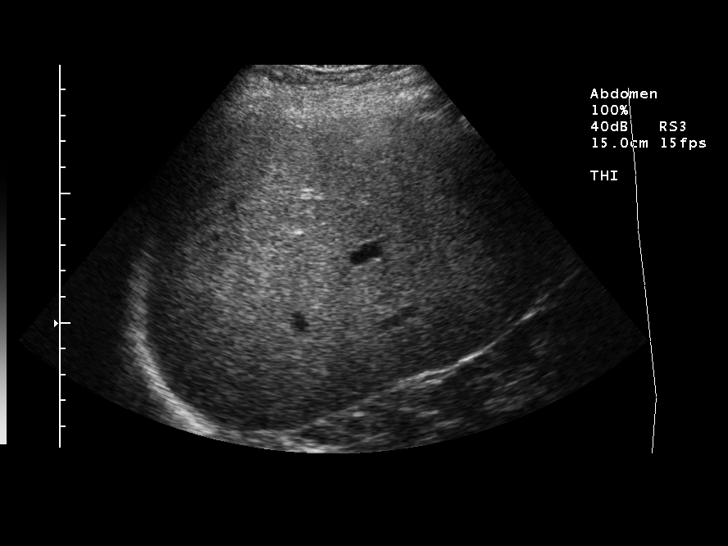
[im 33/87]
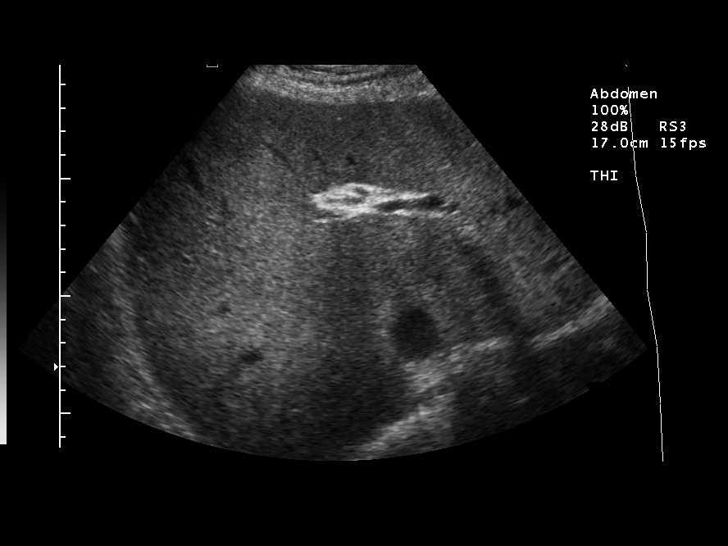
[im 40/87]
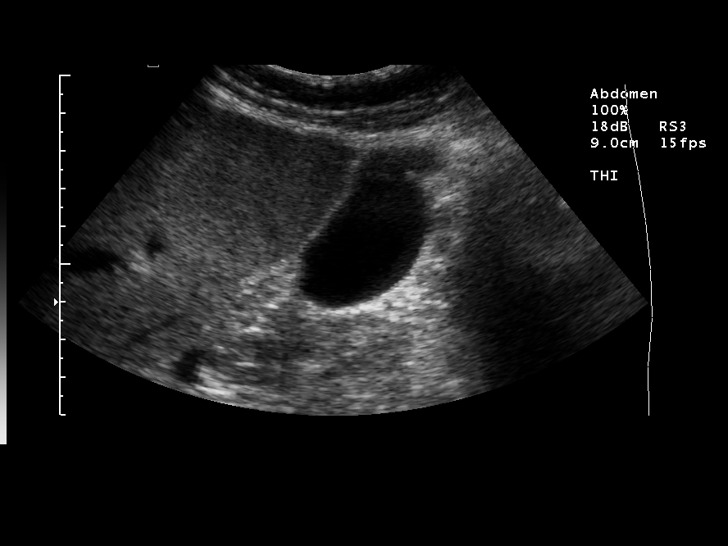
[im 47/87]
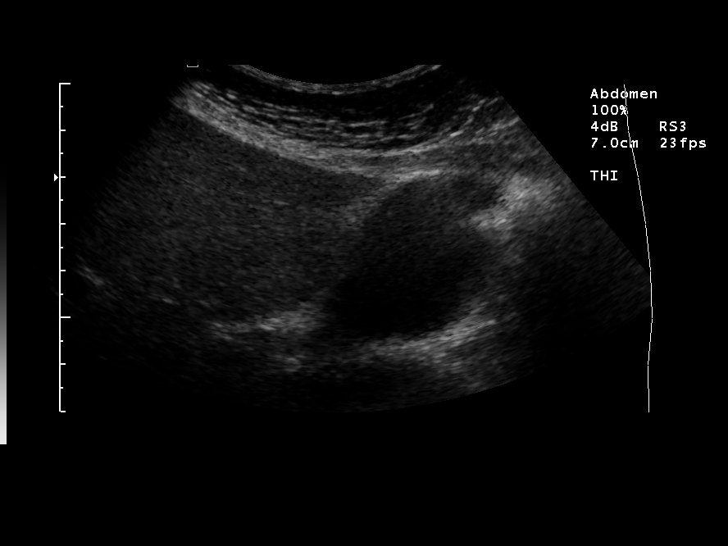
[im 54/87]
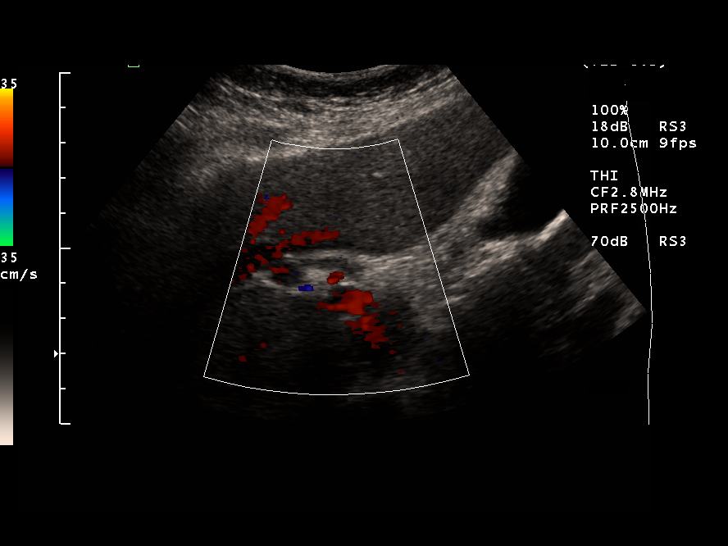
[im 58/87]
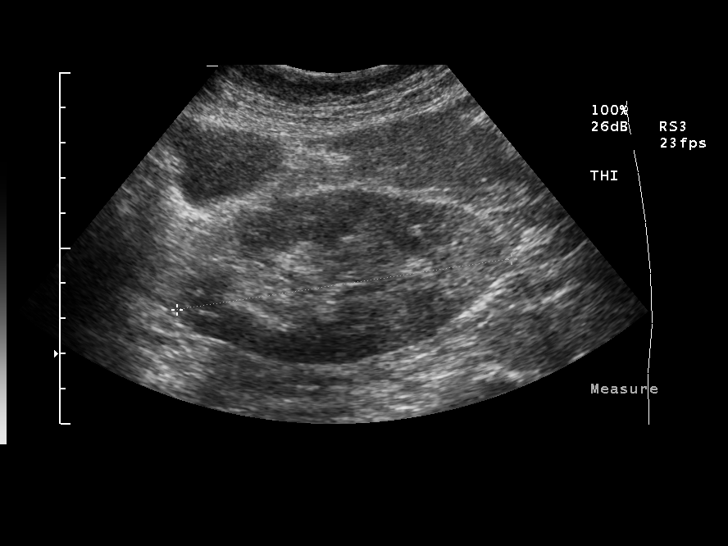
[im 65/87]
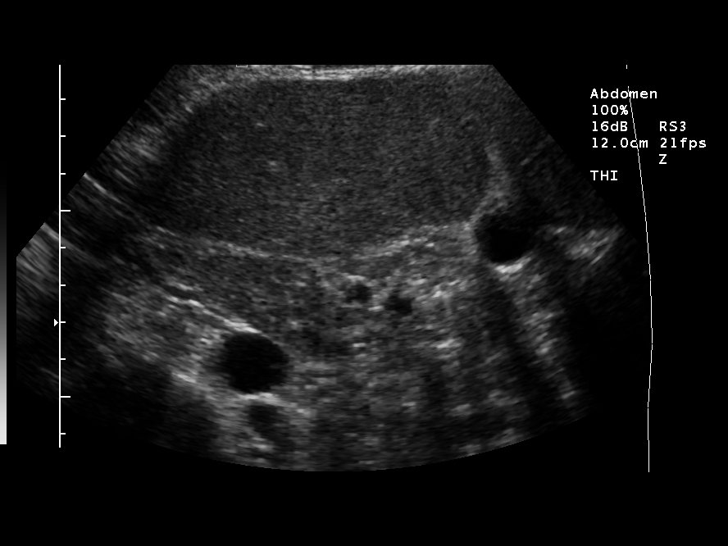
[im 72/87]
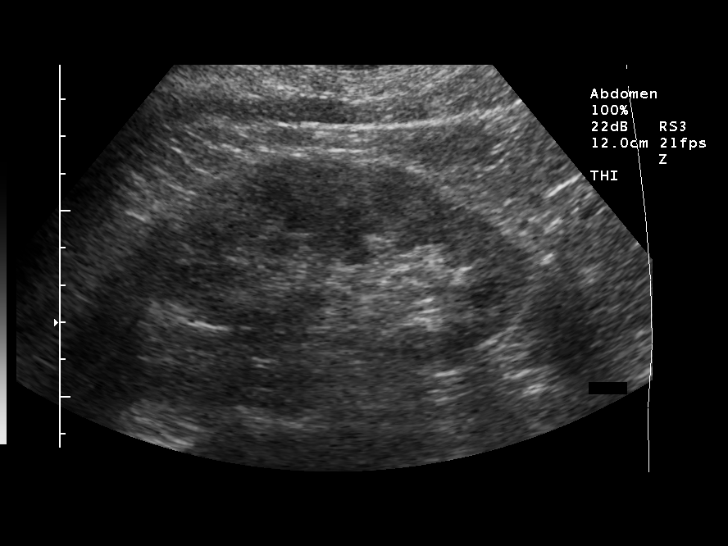
[im 79/87]
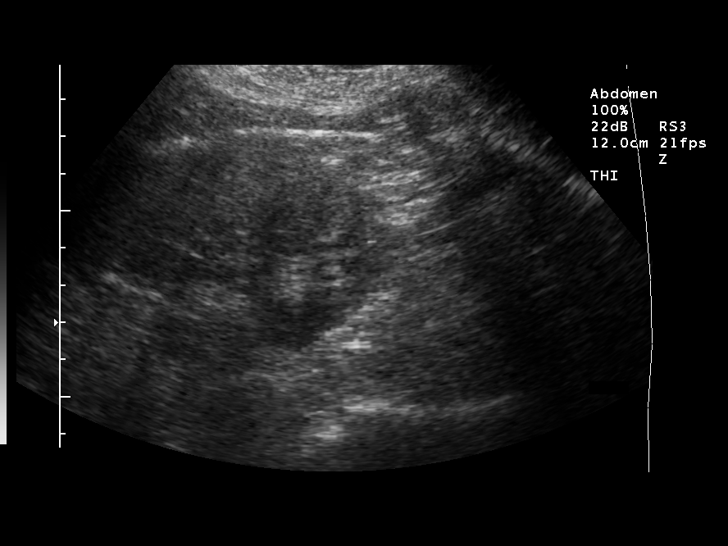
[im 87/87]
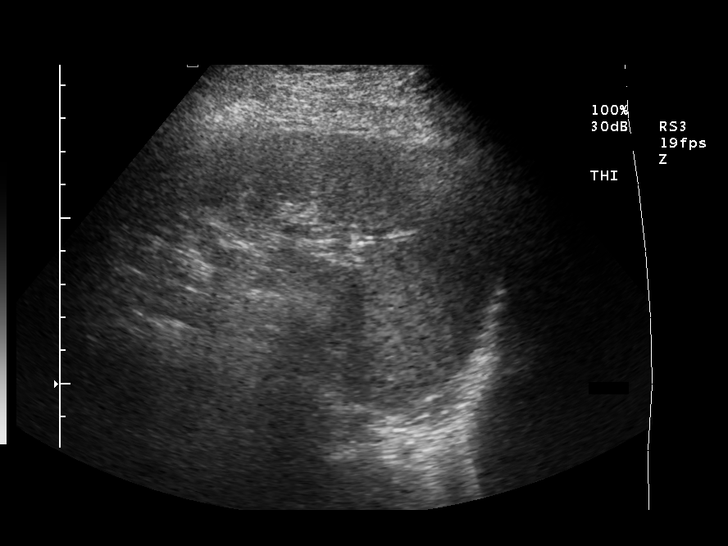

[14 of 25 positions shown; findings below may reference images not displayed]

FINDINGS: The gallbladder is well seen and no gallstones are noted.   The liver is diffusely enlarged measuring 18.9 cm sagittally.  No ductal dilatation is seen.  The common bile duct is normal measuring 4.7 mm.  The IVC, pancreas and spleen appear normal.  No hydronephrosis is seen.  The right kidney measures 10.2 cm sagittally with the left kidney measuring 10.6 cm.  The abdominal aorta is normal in caliber.
IMPRESSION: 1.  No gallstones.
 2.  Hepatomegaly.

## 2009-04-16 IMAGING — CT CT PELVIS W/ CM
1 of 3 series · 14 of 32 positions shown, 19 images · IV contrast (APPLIED)
Comparison: 05/25/2007.

ABDOMEN CT WITH CONTRAST

CLINICAL DATA: Hypertension. Weakness. Possible syncopal episode.
TECHNIQUE: Multidetector CT imaging of the abdomen and pelvis was performed
following the standard protocol during bolus administration of intravenous
contrast.

Contrast:  100 cc Omnipaque 300

[Series 3: abd/pelv with 5.0 b31f st · axial · 0.75mm/px · z∈[-746,-362]mm · 14 of 87 slices shown, 19 images]
[im 5/87  soft-tissue]
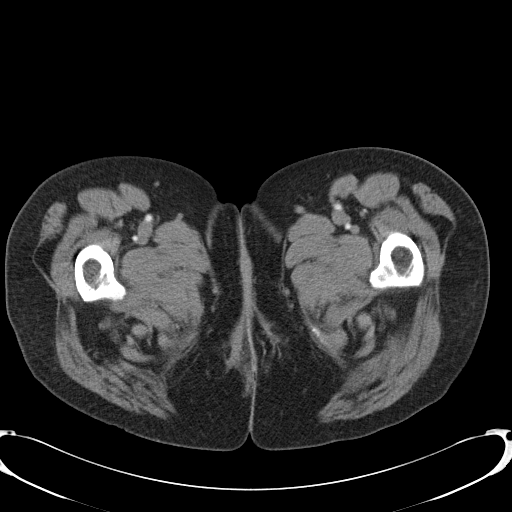
[im 5/87  bone]
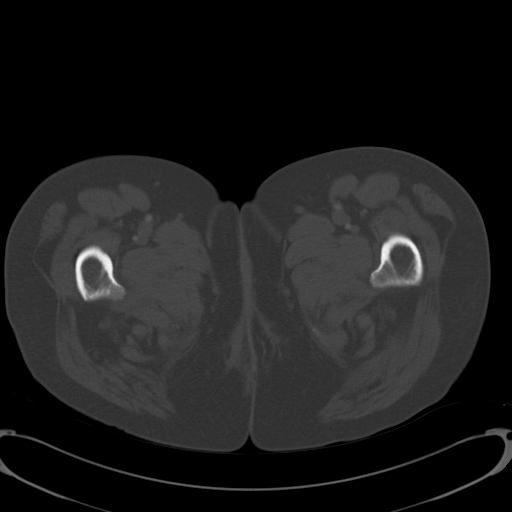
[im 13/87  soft-tissue]
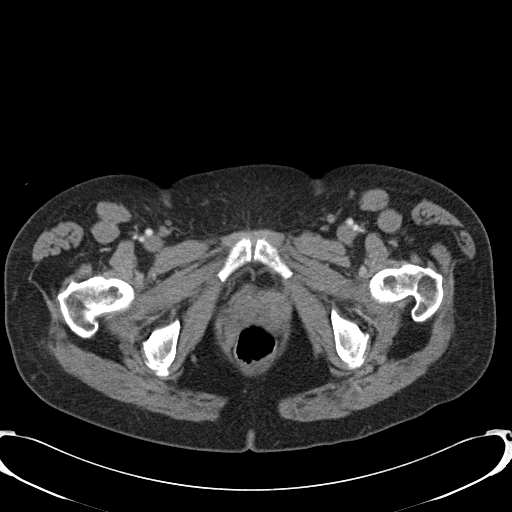
[im 18/87  soft-tissue]
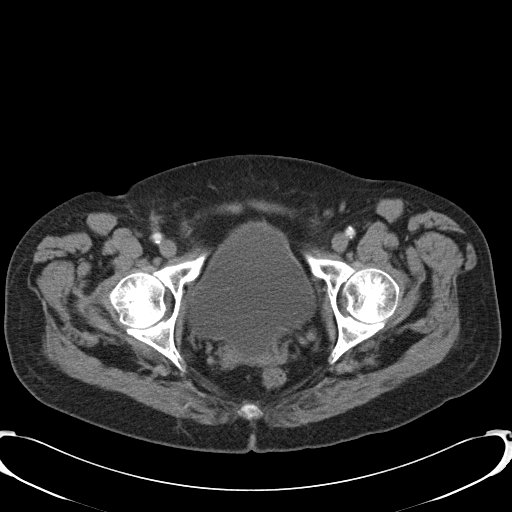
[im 26/87  soft-tissue]
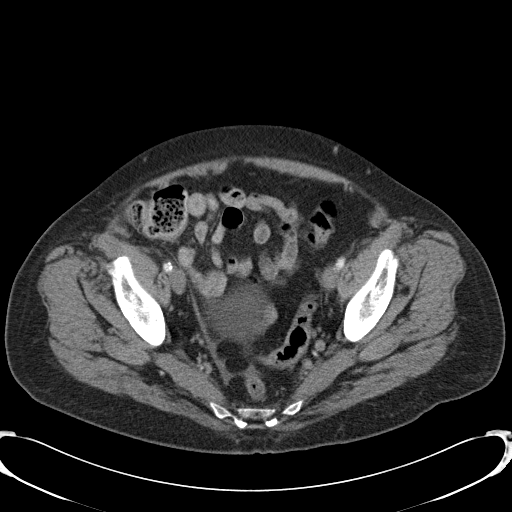
[im 31/87  soft-tissue]
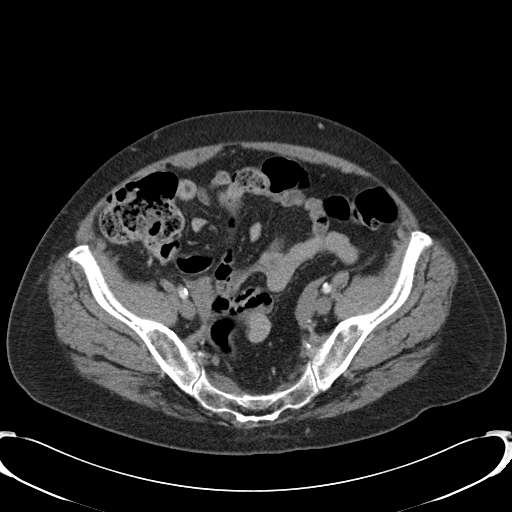
[im 39/87  soft-tissue]
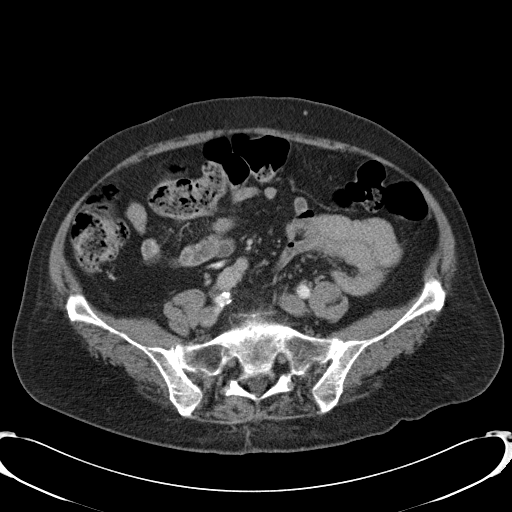
[im 44/87  soft-tissue]
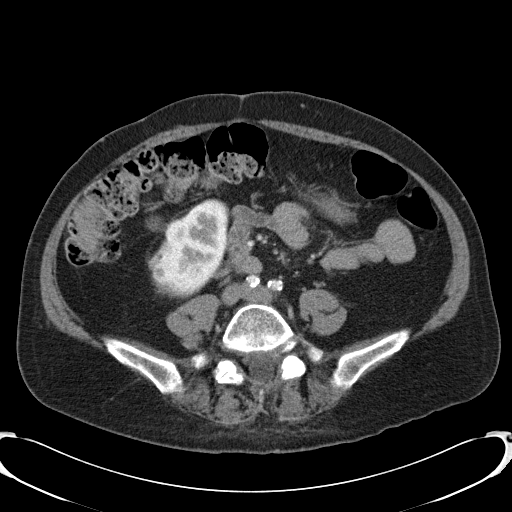
[im 48/87  soft-tissue]
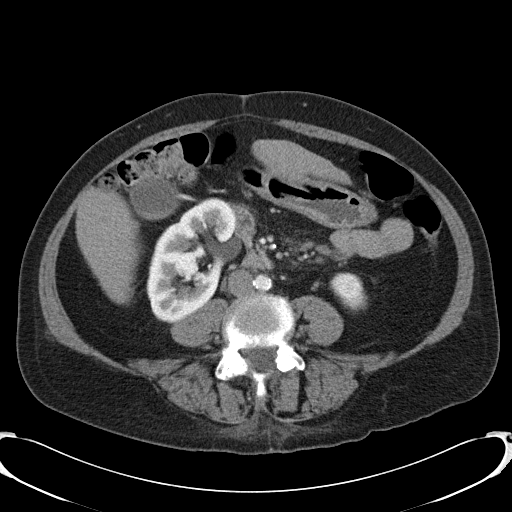
[im 56/87  soft-tissue]
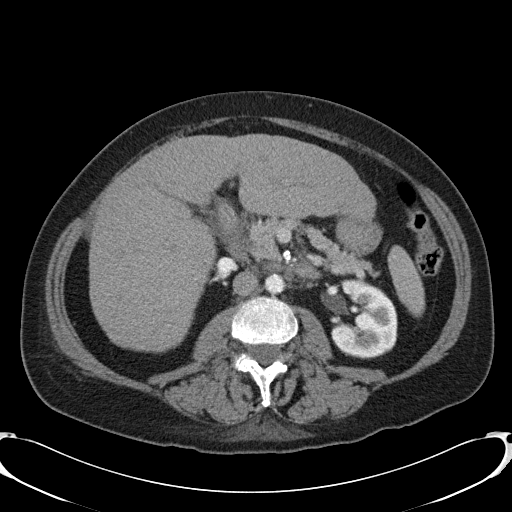
[im 56/87  bone]
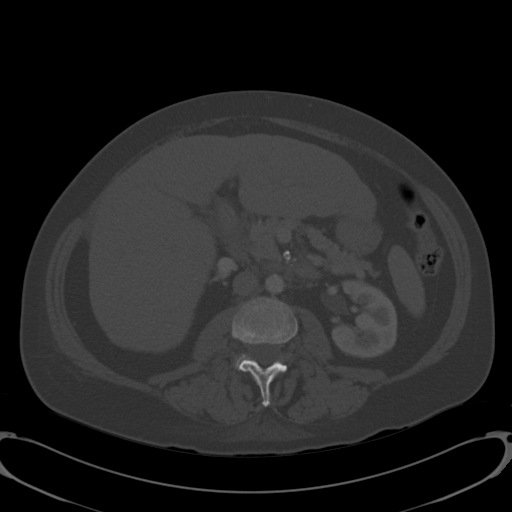
[im 61/87  soft-tissue]
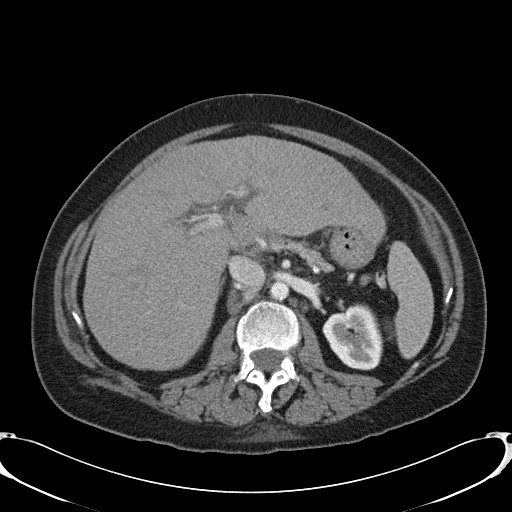
[im 69/87  soft-tissue]
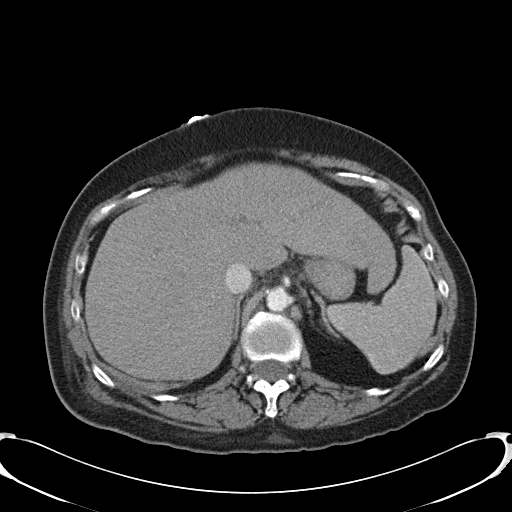
[im 69/87  lung]
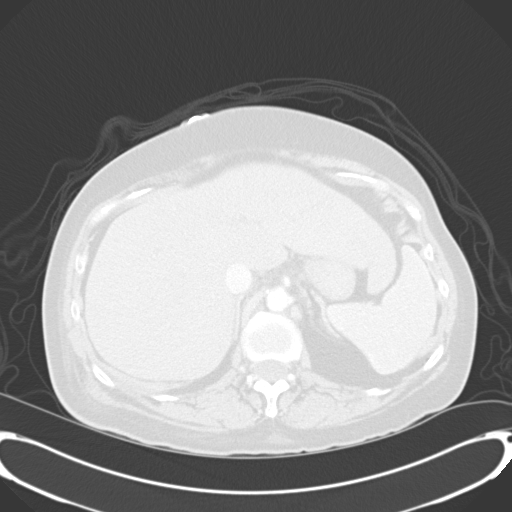
[im 74/87  soft-tissue]
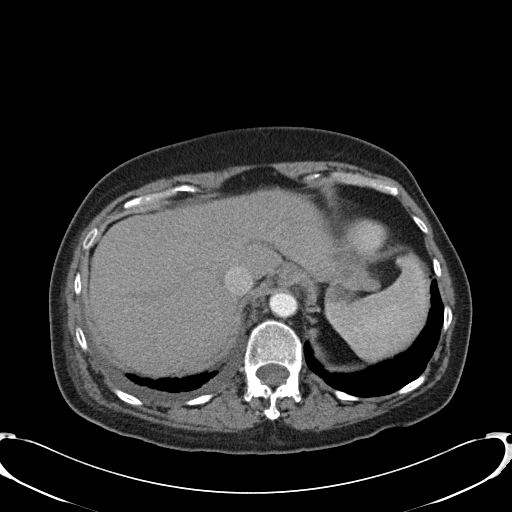
[im 74/87  lung]
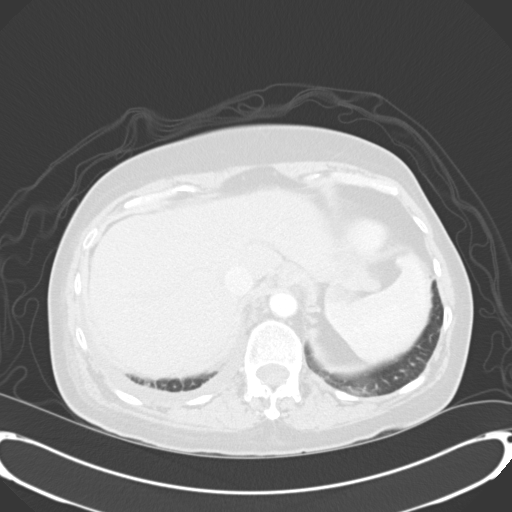
[im 78/87  lung]
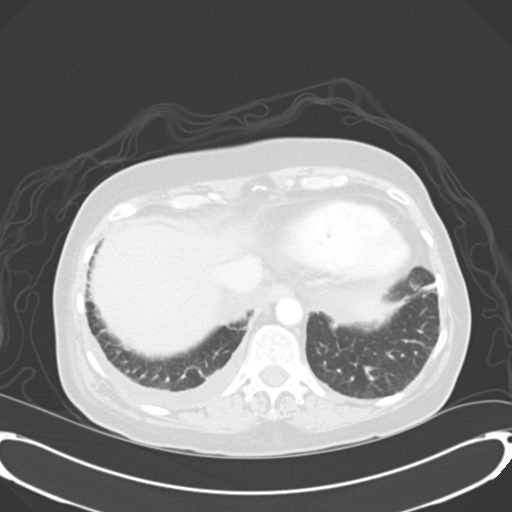
[im 82/87  soft-tissue]
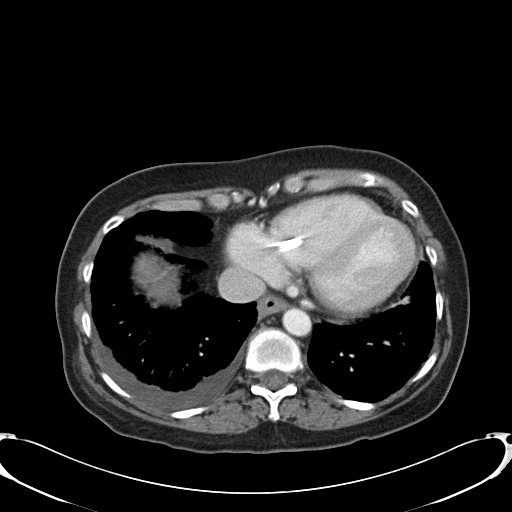
[im 82/87  lung]
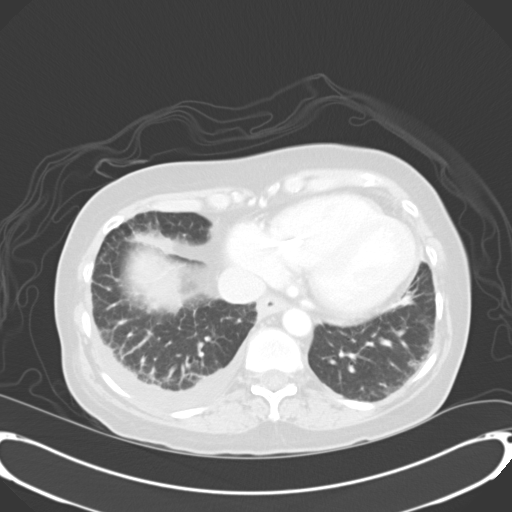

[14 of 32 positions shown; findings below may reference images not displayed]

FINDINGS: Clear lung bases. Pectus excavatum deformity and cardiomegaly. Pacer
with lead right ventricle.

Small right-sided pleural effusion.

6 mm hyperenhancing focus in the hepatic dome on image 12 of series 3 measured
similar or minimally smaller on the prior exam. Again demonstrated are moderate
changes of cirrhosis.

Normal spleen, stomach, and pancreas. Possible gallbladder sludge or small
stones. No biliary ductal dilatation. Mild periportal edema.

Normal left kidney. too small to characterize right renal lesions.

No retroperitoneal or retro crural adenopathy.

Normal colon and terminal ileum.

Normal abdominal bowel loops.

IMPRESSION

1. Moderate cirrhosis with a 6 mm hyper attenuating likely hypervascular focus
in the hepatic dome. Similar or slightly increased since December 2005. Consider
nonemergent evaluation with hepatic MR to evaluate for dysplastic nodule or
small hepatocellular carcinoma. 
2.  no acute abdominal process.
3. Probable gallbladder sludge or small stones.
4. Small right pleural effusion with cardiomegaly.

PELVIS CT WITH CONTRAST
FINDINGS: Small free pelvic fluid. Normal pelvic bowel loops. Appendix not
visualized but no right lower quadrant inflammation seen. No pelvic adenopathy.
Normal urinary bladder. Hysterectomy. No adnexal mass. Normal bones.

IMPRESSION

1. No acute pelvic process.
2. Small ascites.
3. Hysterectomy.

## 2009-04-17 IMAGING — CR DG CHEST 2V
2 series · 2 of 2 positions shown · non-contrast
Comparison: none

HISTORY: Weakness, syncope, hypotension, history atrial fibrillation,
hypertension

[view not recorded (1 of 2)]
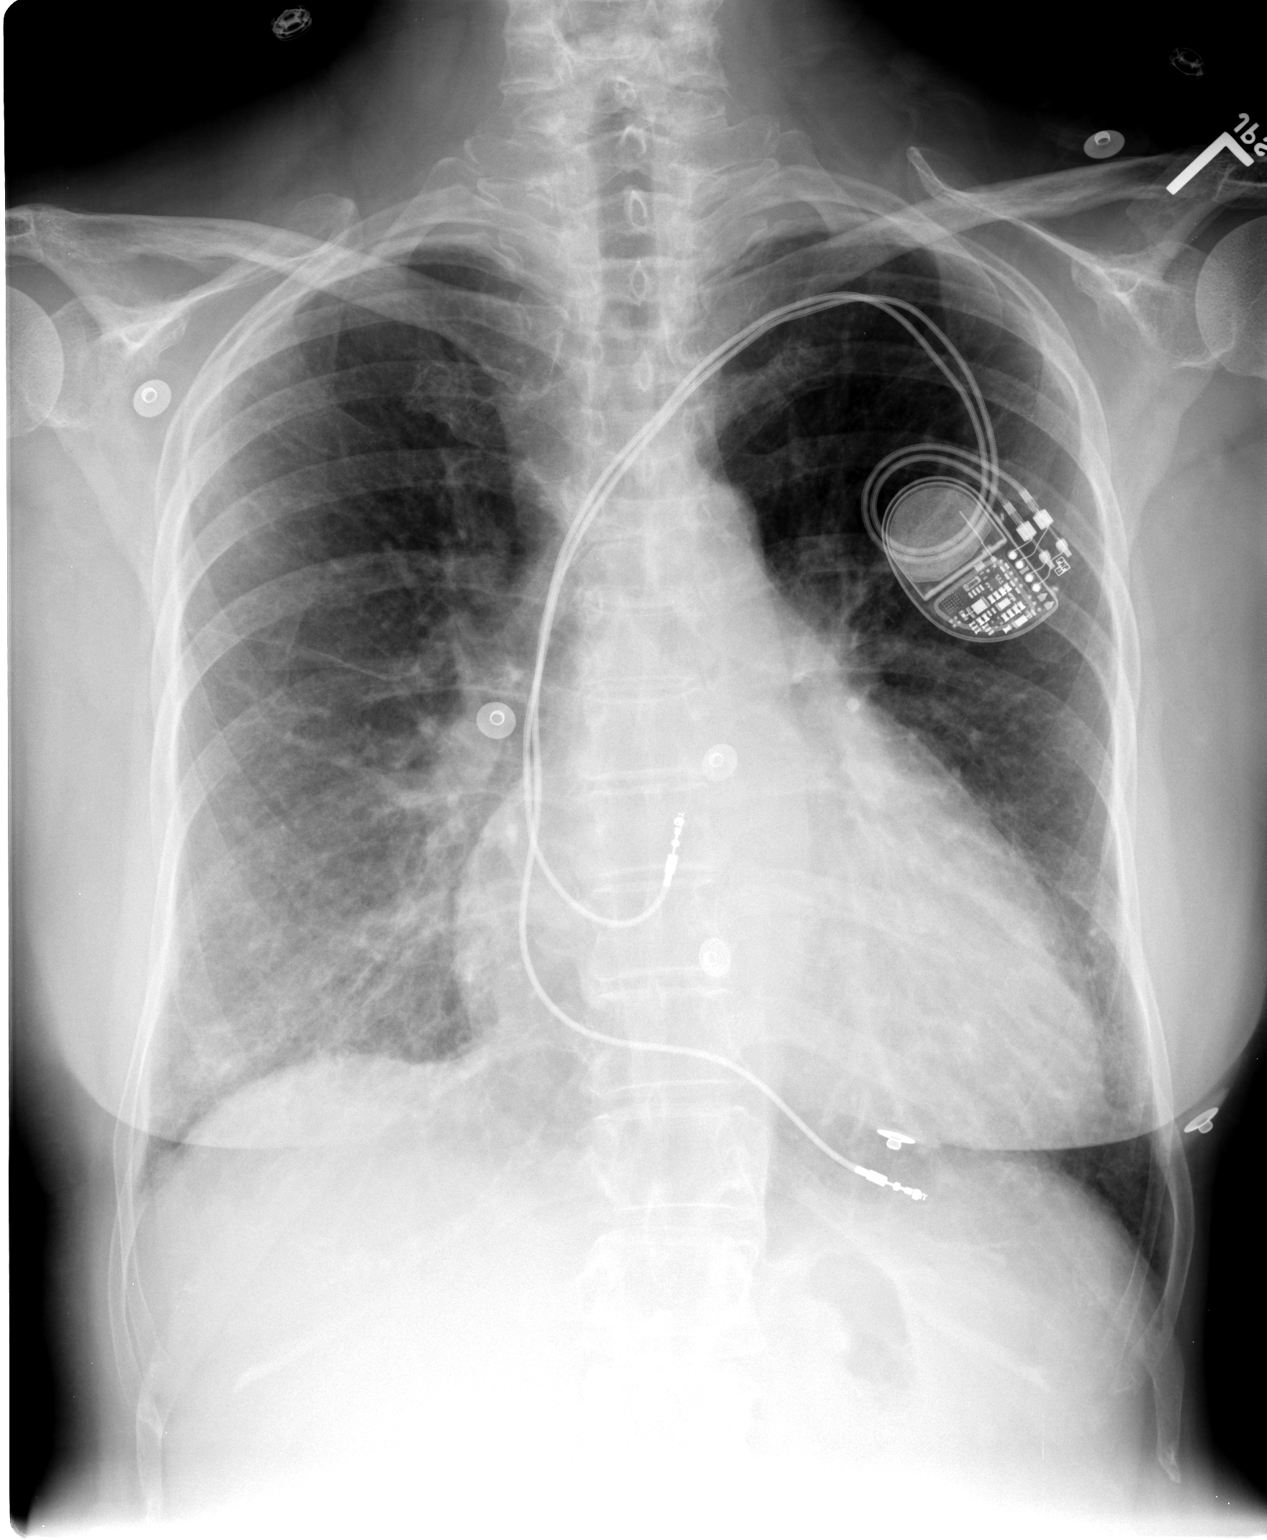

[view not recorded (2 of 2)]
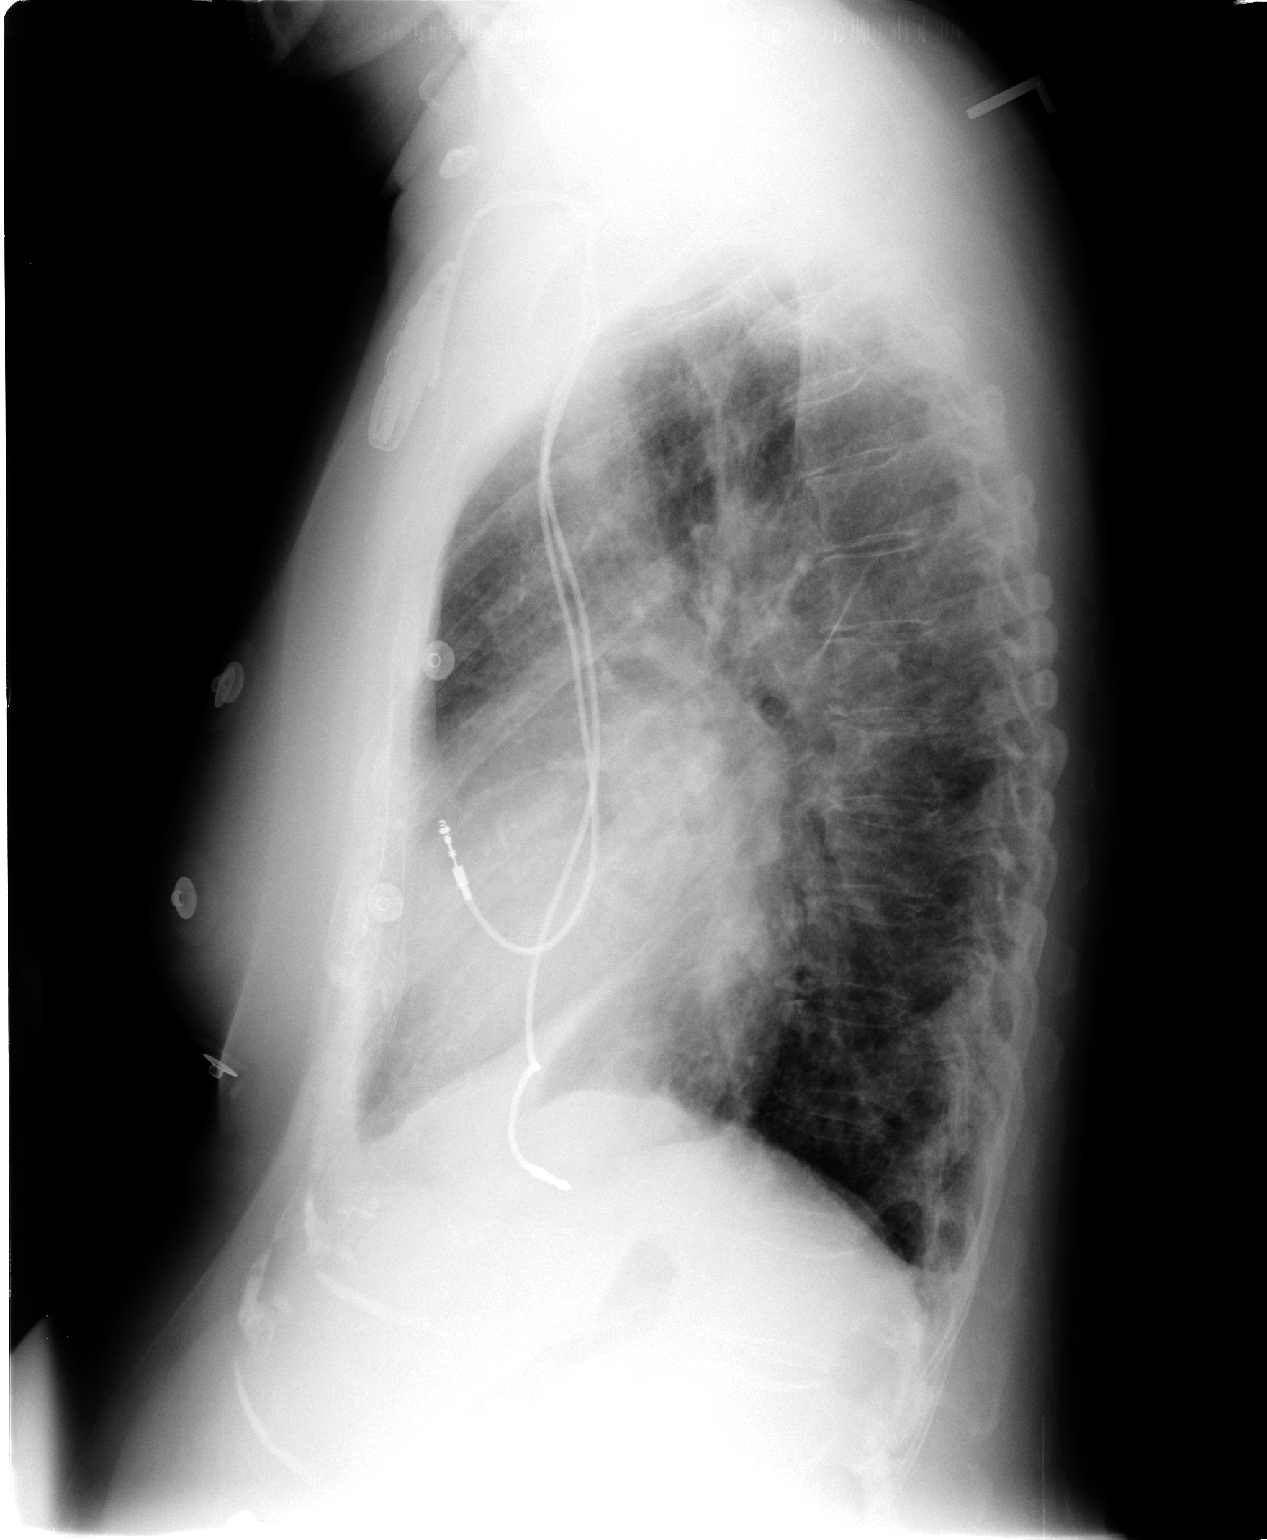

[2 of 2 positions shown; findings below may reference images not displayed]

CHEST 2 VIEWS:

Comparison 06/19/2007

Cardiac enlargement with slight pulmonary vascular congestion.
Left subclavian transvenous pacemaker, leads projecting at right atrium and
right ventricle, unchanged.
Chronic bronchitic changes.
Mild chronic interstitial prominence, stable since an earlier study of
04/30/2006.
No segmental infiltrate or acute failure.
Mild bony demineralization.
IMPRESSION: Cardiac enlargement with pulmonary venous hypertension.
Mild chronic bronchitic and interstitial lung disease changes.
No acute abnormalities.

## 2009-05-22 ENCOUNTER — Encounter: Payer: Self-pay | Admitting: Internal Medicine

## 2009-05-31 ENCOUNTER — Ambulatory Visit: Payer: Self-pay | Admitting: Internal Medicine

## 2009-05-31 DIAGNOSIS — I10 Essential (primary) hypertension: Secondary | ICD-10-CM | POA: Insufficient documentation

## 2009-05-31 DIAGNOSIS — R0602 Shortness of breath: Secondary | ICD-10-CM | POA: Insufficient documentation

## 2009-05-31 DIAGNOSIS — J309 Allergic rhinitis, unspecified: Secondary | ICD-10-CM | POA: Insufficient documentation

## 2009-05-31 DIAGNOSIS — E785 Hyperlipidemia, unspecified: Secondary | ICD-10-CM

## 2009-05-31 DIAGNOSIS — R05 Cough: Secondary | ICD-10-CM

## 2009-06-23 ENCOUNTER — Ambulatory Visit: Payer: Self-pay | Admitting: Internal Medicine

## 2009-06-23 ENCOUNTER — Encounter: Payer: Self-pay | Admitting: Internal Medicine

## 2009-07-24 ENCOUNTER — Encounter: Payer: Self-pay | Admitting: Internal Medicine

## 2009-08-01 ENCOUNTER — Encounter: Payer: Self-pay | Admitting: Internal Medicine

## 2009-08-01 ENCOUNTER — Telehealth: Payer: Self-pay | Admitting: Internal Medicine

## 2009-08-10 ENCOUNTER — Telehealth: Payer: Self-pay | Admitting: Internal Medicine

## 2009-08-16 ENCOUNTER — Encounter: Payer: Self-pay | Admitting: Internal Medicine

## 2009-08-24 ENCOUNTER — Telehealth: Payer: Self-pay | Admitting: Internal Medicine

## 2009-08-24 ENCOUNTER — Ambulatory Visit: Payer: Self-pay | Admitting: Cardiology

## 2009-08-24 LAB — CONVERTED CEMR LAB
BUN: 15 mg/dL (ref 6–23)
Basophils Relative: 4.8 % — ABNORMAL HIGH (ref 0.0–3.0)
Calcium: 9.7 mg/dL (ref 8.4–10.5)
Chloride: 95 meq/L — ABNORMAL LOW (ref 96–112)
Creatinine, Ser: 0.8 mg/dL (ref 0.4–1.2)
Glucose, Bld: 101 mg/dL — ABNORMAL HIGH (ref 70–99)
HCT: 44.8 % (ref 36.0–46.0)
Hemoglobin: 14.9 g/dL (ref 12.0–15.0)
MCHC: 33.2 g/dL (ref 30.0–36.0)
Monocytes Relative: 4.6 % (ref 3.0–12.0)
Potassium: 4.2 meq/L (ref 3.5–5.1)
RDW: 13.1 % (ref 11.5–14.6)

## 2009-08-28 ENCOUNTER — Inpatient Hospital Stay (HOSPITAL_BASED_OUTPATIENT_CLINIC_OR_DEPARTMENT_OTHER): Admission: RE | Admit: 2009-08-28 | Discharge: 2009-08-28 | Payer: Self-pay | Admitting: Internal Medicine

## 2009-08-28 ENCOUNTER — Ambulatory Visit: Payer: Self-pay | Admitting: Internal Medicine

## 2009-09-13 ENCOUNTER — Encounter: Payer: Self-pay | Admitting: Internal Medicine

## 2009-10-02 ENCOUNTER — Ambulatory Visit: Payer: Self-pay | Admitting: Internal Medicine

## 2009-10-03 ENCOUNTER — Encounter: Payer: Self-pay | Admitting: Internal Medicine

## 2009-10-24 IMAGING — CR DG CHEST 1V PORT
1 series · 1 of 1 positions shown · non-contrast
Comparison: 10/19/2007

CLINICAL DATA: Short of breath

PORTABLE CHEST - 1 VIEW

[view not recorded]
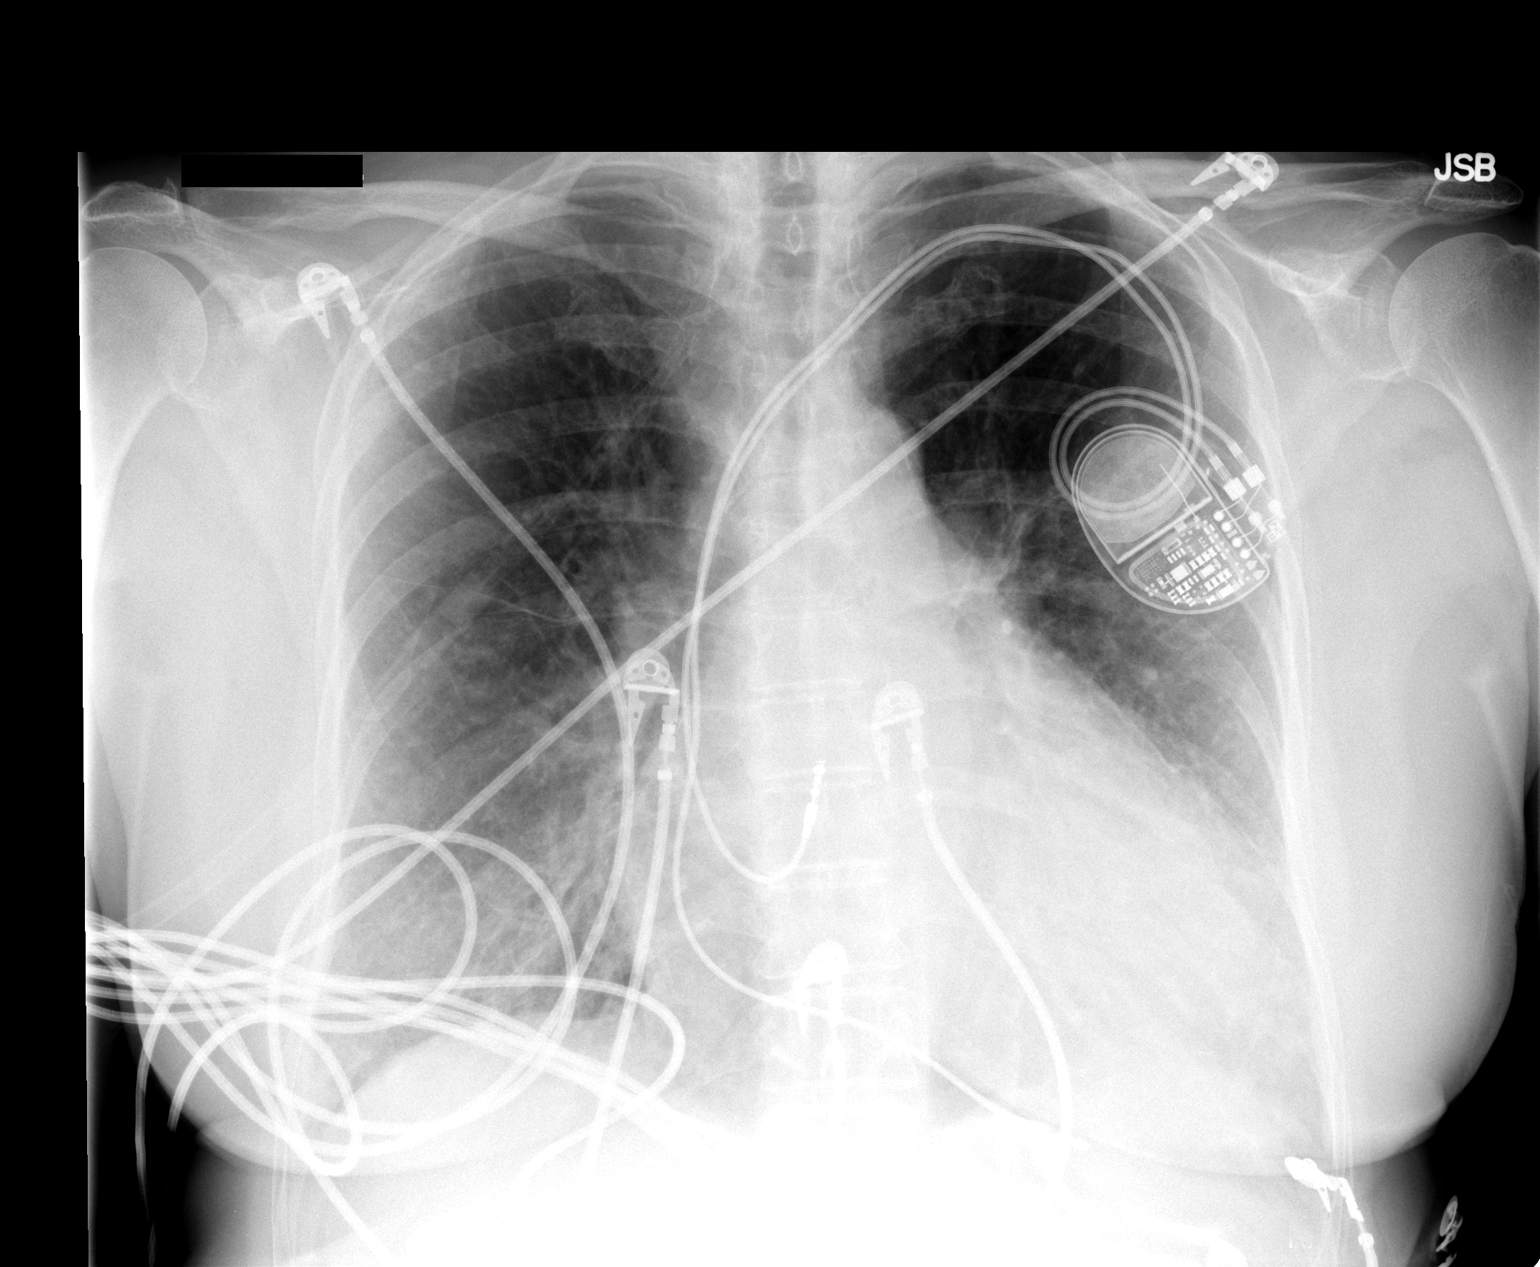

[1 of 1 positions shown; findings below may reference images not displayed]

FINDINGS: Heart enlarged without definite congestive heart failure
or pneumonia.  Prominent markings as before.  Dual lead pacer
unchanged.
IMPRESSION: Cardiomegaly with chronic interstitial changes - no definite
congestive heart failure or pneumonia.

## 2009-10-25 IMAGING — CR DG CHEST 2V
2 series · 2 of 2 positions shown · non-contrast
Comparison: 04/26/2008 study

CLINICAL DATA: Dyspnea. History of asthma and tobacco smoking.

CHEST - 2 VIEW

[w chest pa]
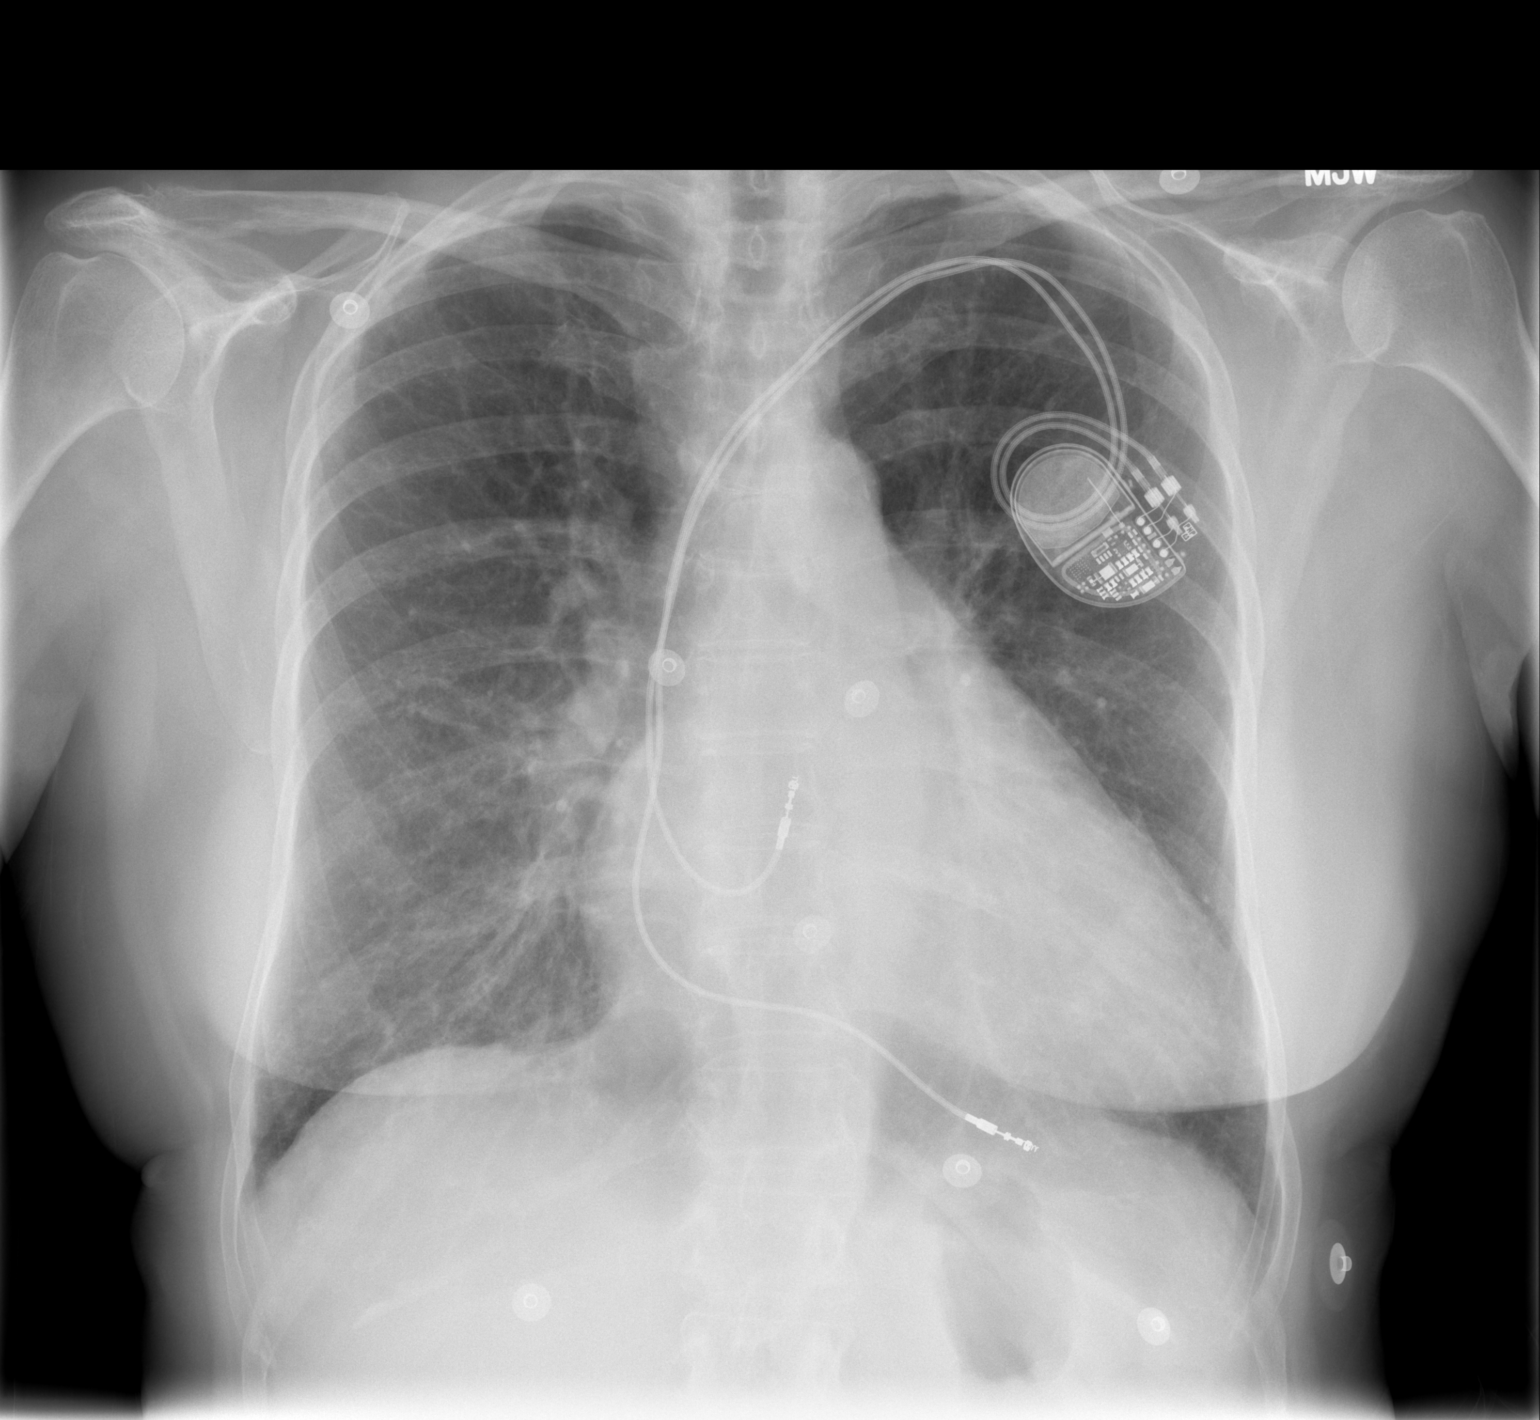

[w chest lat]
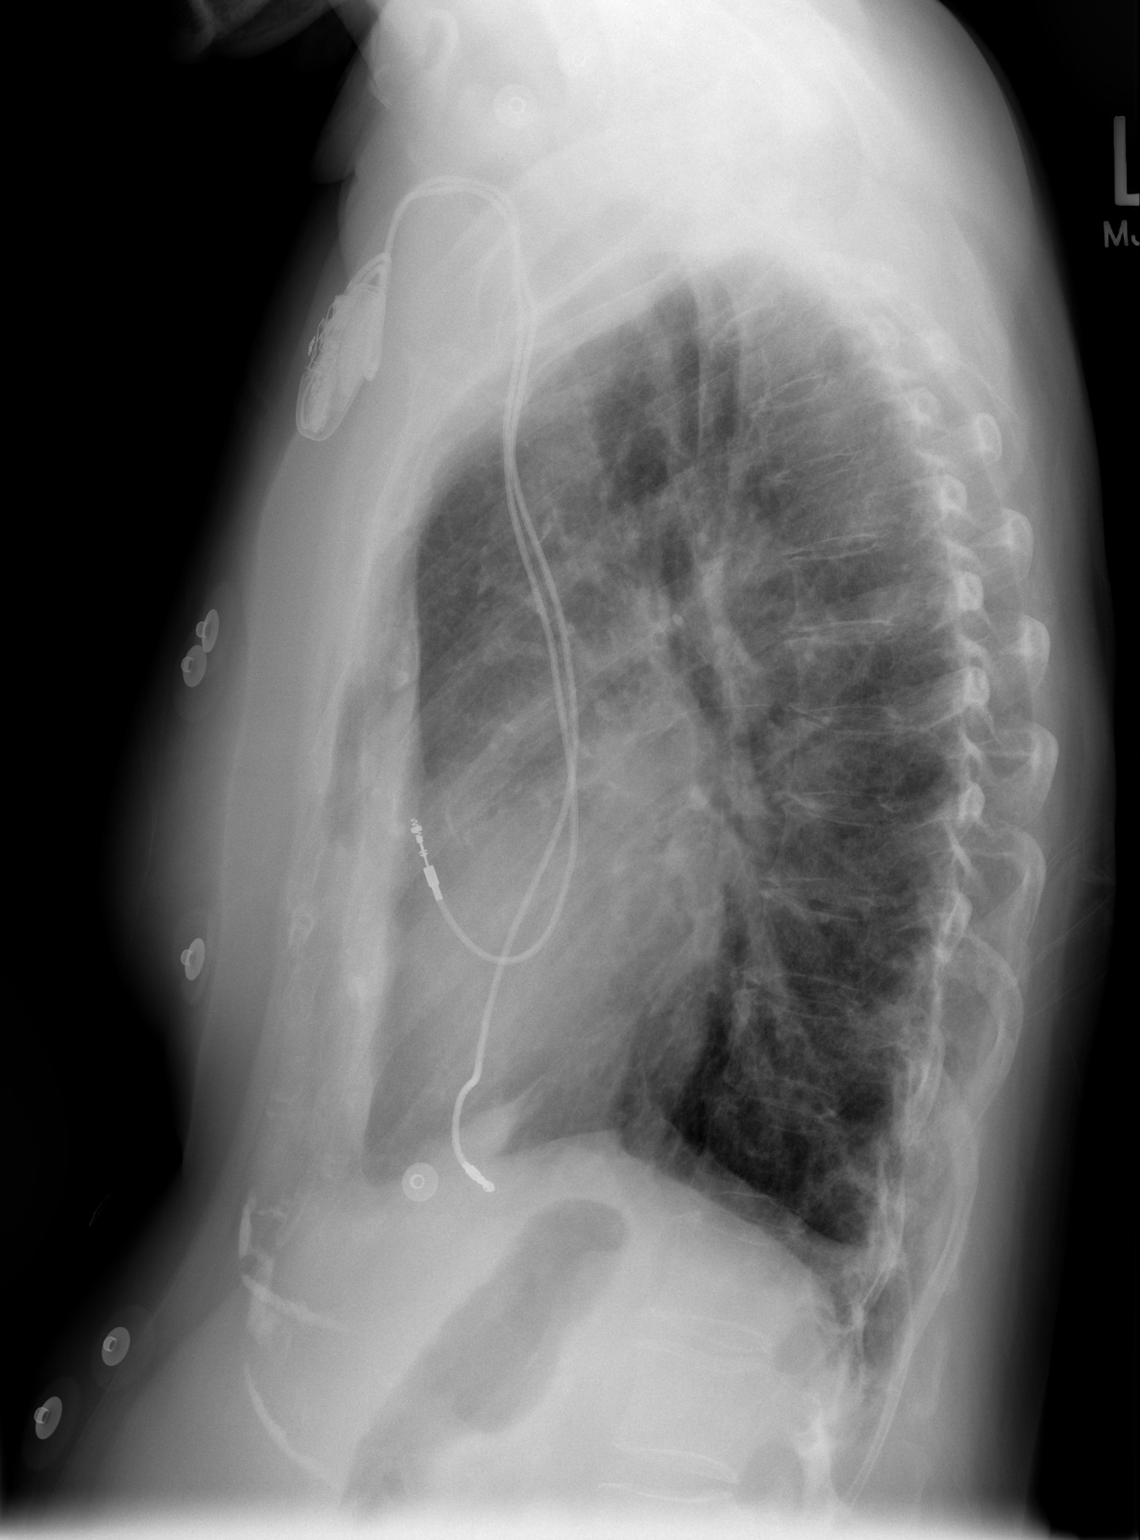

[2 of 2 positions shown; findings below may reference images not displayed]

FINDINGS: A dual lead transvenous pacemaker is in place with
battery pack on the left.

There is stable moderate enlargement of the cardiac silhouette.

No pleural effusions are seen.  Lungs are free of acute
infiltrates.

Stable slight increase in interstitial markings in the right lung
base is seen.

There is a slight hyperinflation configuration with slight
flattening of the diaphragm on lateral view. There is an osteopenic
appearance of the bones with degenerative spondylosis compatible
with age.
IMPRESSION: No acute process seen. Stable slight chronic hyperinflation
configuration with stable slight increased markings in right lung
base.

## 2009-11-11 HISTORY — PX: TOTAL KNEE ARTHROPLASTY: SHX125

## 2010-02-28 ENCOUNTER — Emergency Department (HOSPITAL_COMMUNITY): Admission: EM | Admit: 2010-02-28 | Discharge: 2010-02-28 | Payer: Self-pay | Admitting: Emergency Medicine

## 2010-04-11 ENCOUNTER — Ambulatory Visit: Payer: Self-pay | Admitting: Internal Medicine

## 2010-04-11 DIAGNOSIS — F329 Major depressive disorder, single episode, unspecified: Secondary | ICD-10-CM

## 2010-04-11 DIAGNOSIS — F3289 Other specified depressive episodes: Secondary | ICD-10-CM | POA: Insufficient documentation

## 2010-05-07 ENCOUNTER — Ambulatory Visit: Payer: Self-pay | Admitting: Internal Medicine

## 2010-05-10 ENCOUNTER — Telehealth: Payer: Self-pay | Admitting: Adult Health

## 2010-05-23 ENCOUNTER — Ambulatory Visit: Payer: Self-pay | Admitting: Internal Medicine

## 2010-05-23 DIAGNOSIS — J449 Chronic obstructive pulmonary disease, unspecified: Secondary | ICD-10-CM

## 2010-05-23 DIAGNOSIS — J4489 Other specified chronic obstructive pulmonary disease: Secondary | ICD-10-CM | POA: Insufficient documentation

## 2010-05-24 ENCOUNTER — Encounter: Payer: Self-pay | Admitting: Internal Medicine

## 2010-06-26 ENCOUNTER — Ambulatory Visit: Payer: Self-pay | Admitting: Cardiology

## 2010-06-27 ENCOUNTER — Inpatient Hospital Stay (HOSPITAL_COMMUNITY): Admission: RE | Admit: 2010-06-27 | Discharge: 2010-07-02 | Payer: Self-pay | Admitting: Orthopedic Surgery

## 2010-06-30 ENCOUNTER — Ambulatory Visit: Payer: Self-pay | Admitting: Cardiology

## 2010-07-01 ENCOUNTER — Ambulatory Visit: Payer: Self-pay | Admitting: Surgery

## 2010-07-01 ENCOUNTER — Encounter (INDEPENDENT_AMBULATORY_CARE_PROVIDER_SITE_OTHER): Payer: Self-pay | Admitting: Orthopedic Surgery

## 2010-07-05 ENCOUNTER — Observation Stay (HOSPITAL_COMMUNITY): Admission: EM | Admit: 2010-07-05 | Discharge: 2010-07-08 | Payer: Self-pay | Admitting: Emergency Medicine

## 2010-07-05 ENCOUNTER — Ambulatory Visit: Payer: Self-pay | Admitting: Cardiology

## 2010-07-24 IMAGING — CT CT HEAD WO/W CM
1 of 2 series · 13 of 30 positions shown, 17 images · IV contrast (agent unspecified)
Comparison: 03/31/2007

CLINICAL DATA: Severe headache, particularly left orbital and
temporal.

CT HEAD WITHOUT AND WITH CONTRAST
TECHNIQUE: Contiguous axial images were obtained from the base of
the skull through the vertex without and with intravenous contrast.
Contrast: 75 ml Nmnipaque-RBB

[Series 32: 3d filtered head · axial · 0.49mm/px · z∈[+16,+140]mm · 13 of 28 slices shown, 17 images]
[im 2/28  brain]
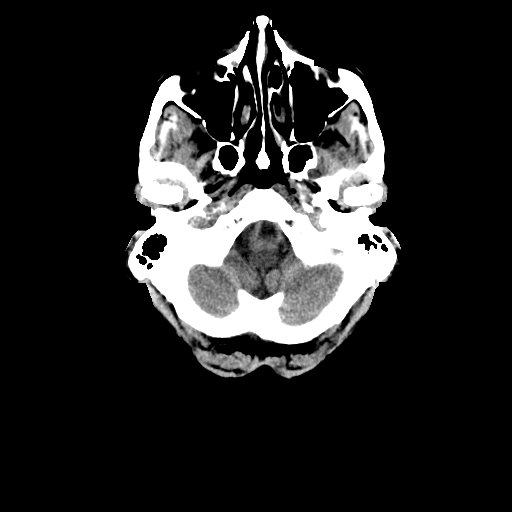
[im 2/28  bone]
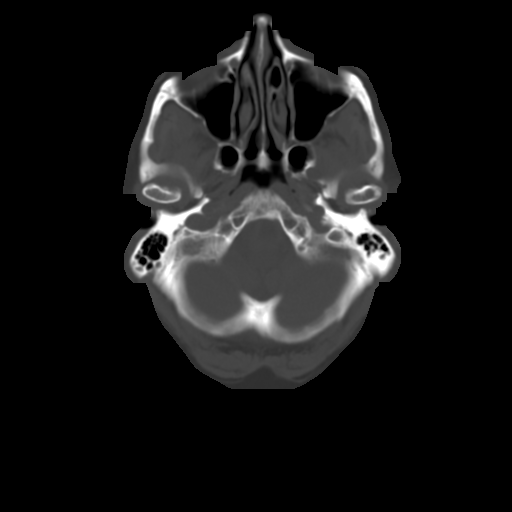
[im 4/28  brain]
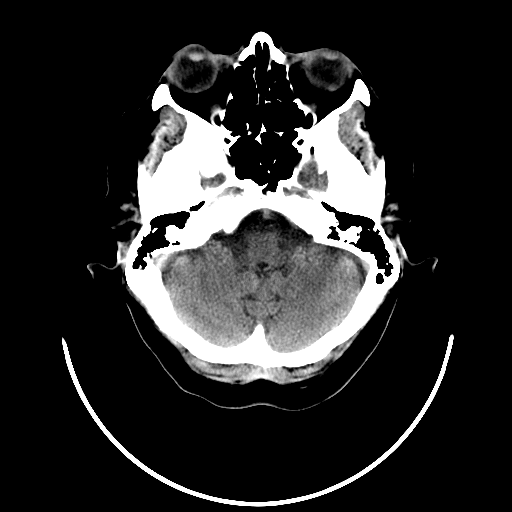
[im 6/28  brain]
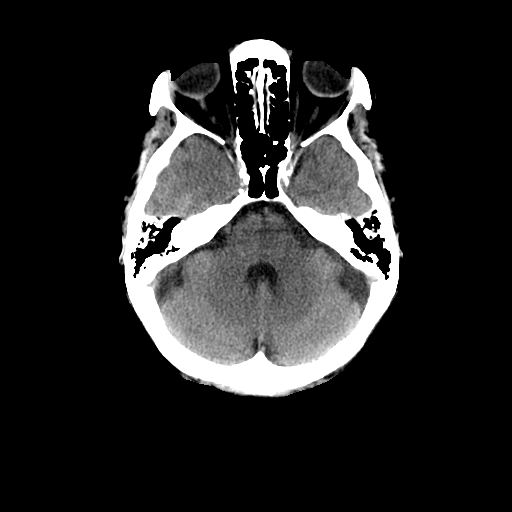
[im 8/28  brain]
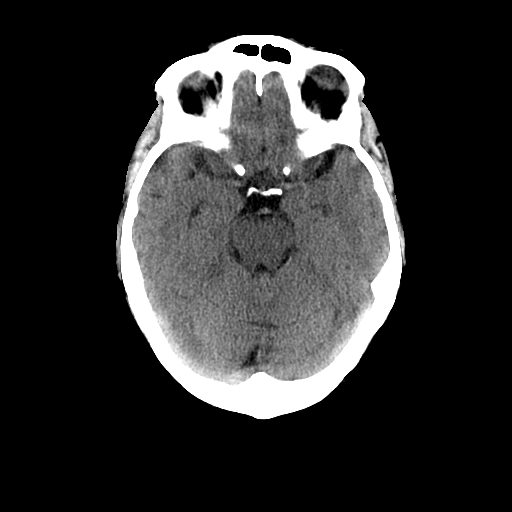
[im 10/28  brain]
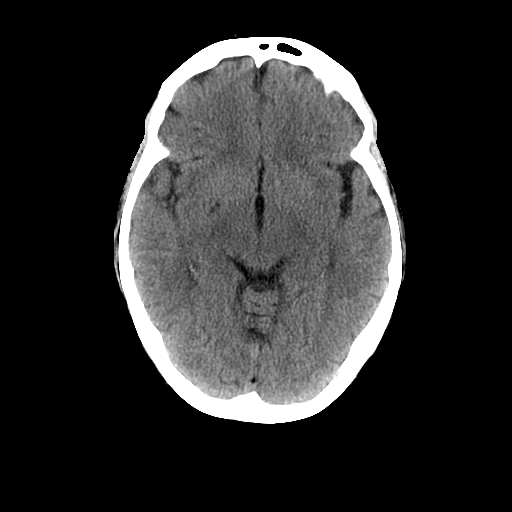
[im 10/28  bone]
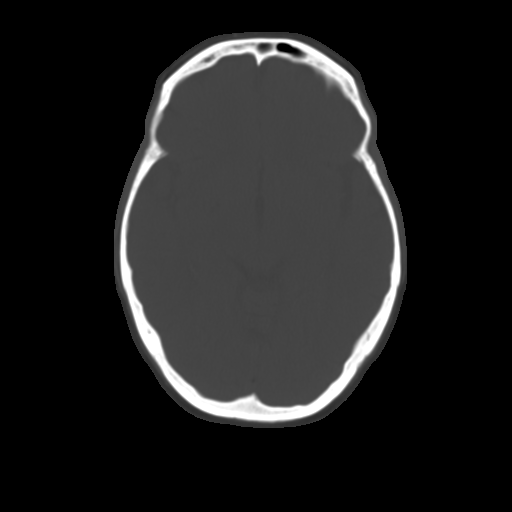
[im 12/28  brain]
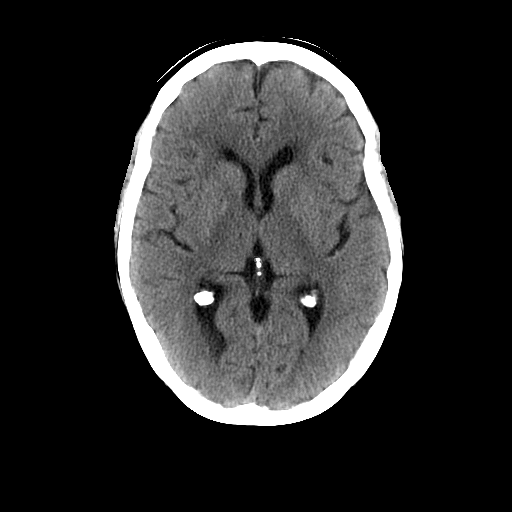
[im 14/28  brain]
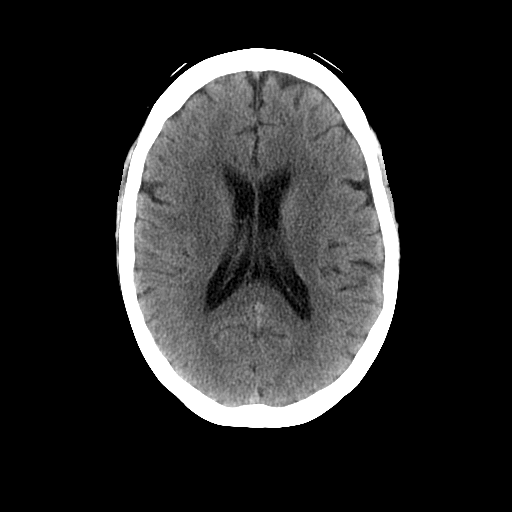
[im 16/28  brain]
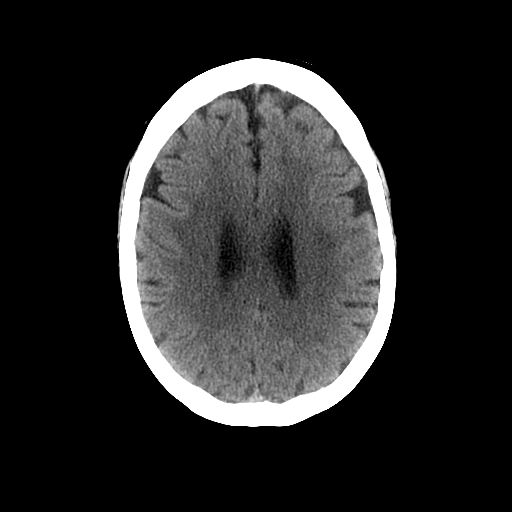
[im 18/28  brain]
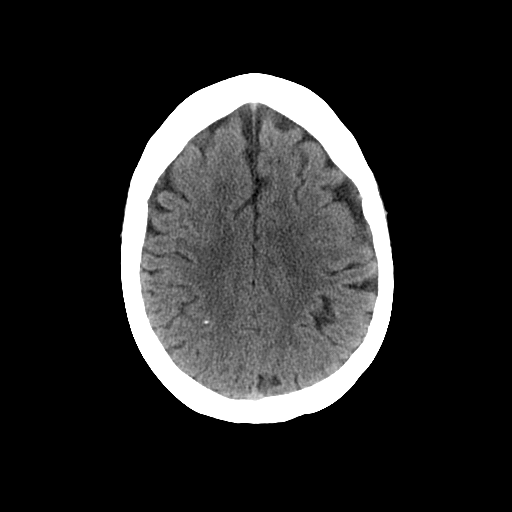
[im 18/28  bone]
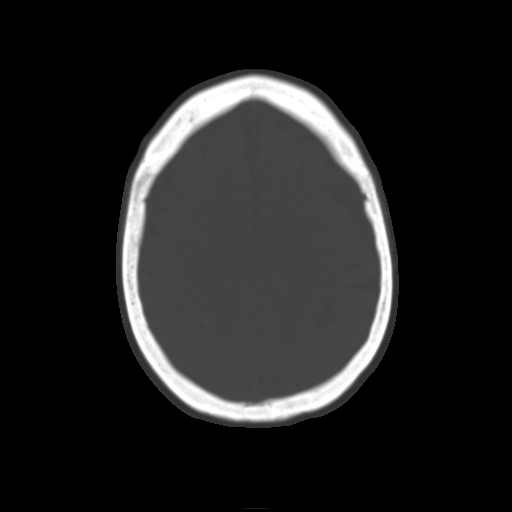
[im 20/28  brain]
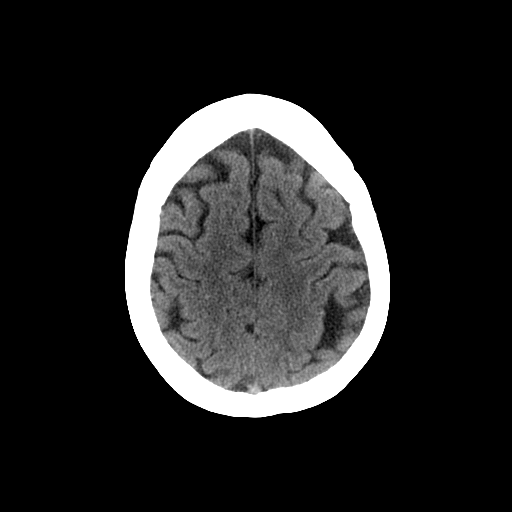
[im 22/28  brain]
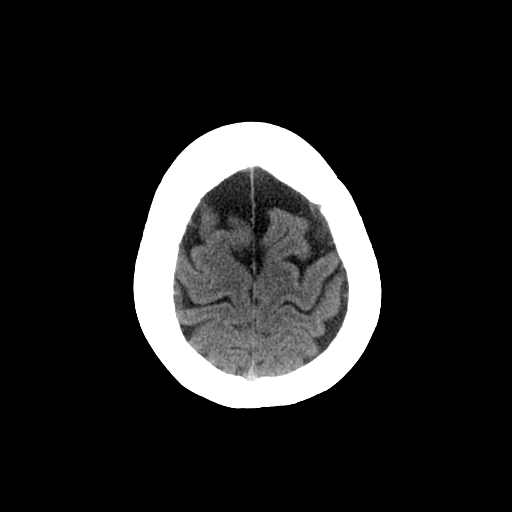
[im 24/28  brain]
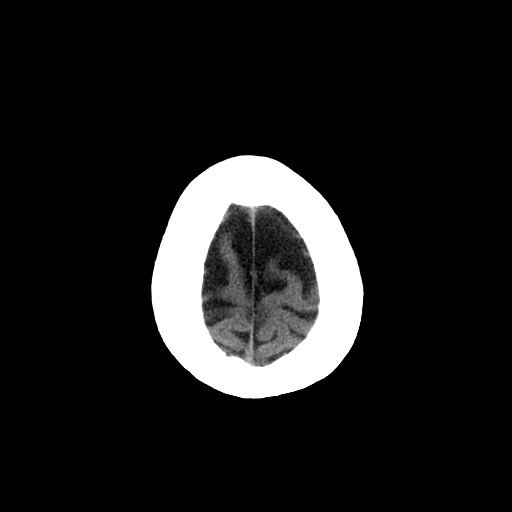
[im 26/28  brain]
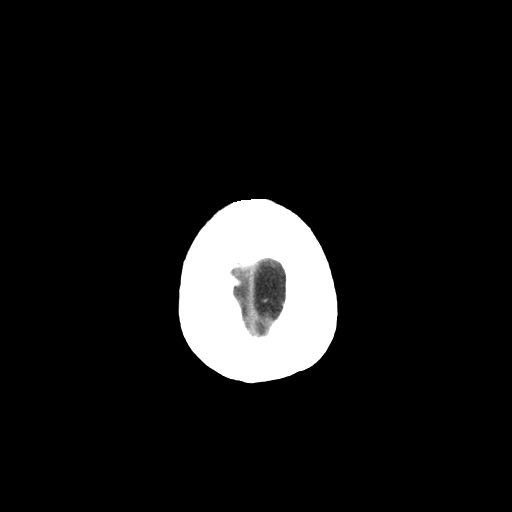
[im 26/28  bone]
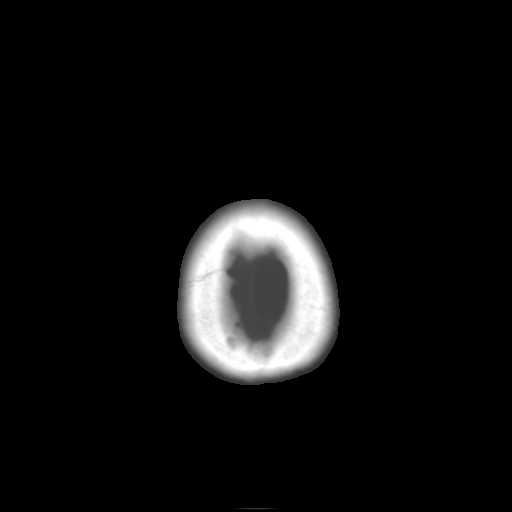

[13 of 30 positions shown; findings below may reference images not displayed]

FINDINGS: There is mild generalized atrophy.  There are chronic
appearing small vessel changes of a mild degree within the
hemispheric deep white matter.  No cortical or large vessel
infarction.  No evidence of acute or subacute infarction.  No sign
of mass lesion, hemorrhage, hydrocephalus or extra-axial
collection.  There is a punctate calcification in the right
parietal deep white matter not of any clinical relevance.  Contrast
images show a few vessels in that region and it is possible there
is a venous angioma.  No sign of enhancing mass or meningitis.  The
calvarium is unremarkable.  The sinuses, middle ears and mastoids
are clear.  No orbital lesion is seen.
IMPRESSION: No acute or reversible process.  No specific cause of the patient's
described symptoms is identified.  Age related atrophy and minimal
small vessel disease.  Possible small venous angioma in the right
parietal white matter that should not be of any clinical relevance.

## 2010-07-30 ENCOUNTER — Encounter (INDEPENDENT_AMBULATORY_CARE_PROVIDER_SITE_OTHER): Payer: Self-pay | Admitting: Orthopedic Surgery

## 2010-07-30 ENCOUNTER — Ambulatory Visit (HOSPITAL_COMMUNITY)
Admission: RE | Admit: 2010-07-30 | Discharge: 2010-07-30 | Payer: Self-pay | Source: Home / Self Care | Admitting: Orthopedic Surgery

## 2010-07-30 ENCOUNTER — Ambulatory Visit: Payer: Self-pay | Admitting: Surgery

## 2010-08-04 ENCOUNTER — Ambulatory Visit: Payer: Self-pay | Admitting: Cardiology

## 2010-08-14 ENCOUNTER — Ambulatory Visit: Payer: Self-pay | Admitting: Cardiology

## 2010-08-28 ENCOUNTER — Ambulatory Visit: Payer: Self-pay | Admitting: Cardiology

## 2010-09-10 ENCOUNTER — Ambulatory Visit: Payer: Self-pay | Admitting: Cardiology

## 2010-09-15 ENCOUNTER — Encounter: Payer: Self-pay | Admitting: Internal Medicine

## 2010-09-25 ENCOUNTER — Ambulatory Visit: Payer: Self-pay | Admitting: Cardiology

## 2010-09-25 ENCOUNTER — Encounter: Payer: Self-pay | Admitting: Adult Health

## 2010-09-26 ENCOUNTER — Ambulatory Visit: Payer: Self-pay | Admitting: Internal Medicine

## 2010-10-08 ENCOUNTER — Ambulatory Visit: Payer: Self-pay | Admitting: Internal Medicine

## 2010-10-12 ENCOUNTER — Ambulatory Visit: Payer: Self-pay | Admitting: Cardiology

## 2010-10-22 ENCOUNTER — Ambulatory Visit: Payer: Self-pay | Admitting: Internal Medicine

## 2010-11-08 ENCOUNTER — Encounter: Payer: Self-pay | Admitting: Internal Medicine

## 2010-11-14 ENCOUNTER — Ambulatory Visit: Payer: Self-pay | Admitting: Cardiology

## 2010-11-19 ENCOUNTER — Ambulatory Visit
Admission: RE | Admit: 2010-11-19 | Discharge: 2010-11-19 | Payer: Self-pay | Source: Home / Self Care | Attending: Internal Medicine | Admitting: Internal Medicine

## 2010-11-29 ENCOUNTER — Ambulatory Visit (HOSPITAL_COMMUNITY)
Admission: RE | Admit: 2010-11-29 | Discharge: 2010-11-29 | Payer: Self-pay | Source: Home / Self Care | Attending: Anesthesiology | Admitting: Anesthesiology

## 2010-11-30 ENCOUNTER — Ambulatory Visit
Admission: RE | Admit: 2010-11-30 | Discharge: 2010-11-30 | Payer: Self-pay | Source: Home / Self Care | Attending: Internal Medicine | Admitting: Internal Medicine

## 2010-11-30 ENCOUNTER — Encounter: Payer: Self-pay | Admitting: Internal Medicine

## 2010-11-30 ENCOUNTER — Telehealth: Payer: Self-pay | Admitting: Internal Medicine

## 2010-12-02 ENCOUNTER — Encounter: Payer: Self-pay | Admitting: Orthopaedic Surgery

## 2010-12-06 ENCOUNTER — Ambulatory Visit: Payer: Self-pay | Admitting: Cardiology

## 2010-12-11 NOTE — Miscellaneous (Signed)
Summary: Device preload  Clinical Lists Changes  Observations: Added new observation of PPM INDICATN: Sick sinus syndrome (09/15/2010 11:53) Added new observation of MAGNET RTE: BOL 85 ERI 65 (09/15/2010 11:53) Added new observation of PPMLEADSTAT2: active (09/15/2010 11:53) Added new observation of PPMLEADSER2: [id number] (09/15/2010 11:53) Added new observation of PPMLEADMOD2: 1388T (09/15/2010 11:53) Added new observation of PPMLEADDOI2: 03/31/1997 (09/15/2010 11:53) Added new observation of PPMLEADLOC2: RV (09/15/2010 11:53) Added new observation of PPMLEADSTAT1: active (09/15/2010 11:53) Added new observation of PPMLEADSER1: [id number] (09/15/2010 11:53) Added new observation of PPMLEADMOD1: 1388T (09/15/2010 11:53) Added new observation of PPMLEADDOI1: 03/31/1997 (09/15/2010 11:53) Added new observation of PPMLEADLOC1: RA (09/15/2010 11:53) Added new observation of PPM DOI: 01/02/2005 (09/15/2010 11:53) Added new observation of PPM SERL#: [id number] (09/15/2010 11:53) Added new observation of PPM MODL#: [id number] (09/15/2010 40:10) Added new observation of PACEMAKERMFG: [employer] (09/15/2010 11:53) Added new observation of PPM IMP MD: Duffy Rhody Tennant,MD (09/15/2010 11:53) Added new observation of PPM REFER MD: Villa Herb (09/15/2010 11:53) Added new observation of PACEMAKER MD: Sherryl Manges, MD (09/15/2010 11:53)      PPM Specifications Following MD:  Sherryl Manges, MD     Referring MD:  Villa Herb PPM Vendor:  [employer]     PPM Model Number:  [id number]     PPM Serial Number:  [id number] PPM DOI:  01/02/2005     PPM Implanting MD:  Rolla Plate  Lead 1    Location: RA     DOI: 03/31/1997     Model #: [id number]     Serial #: [id number]     Status: active Lead 2    Location: RV     DOI: 03/31/1997     Model #: [id number]     Serial #: [id number]     Status: active  Magnet Response Rate:  BOL 85 ERI 65  Indications:  Sick sinus syndrome

## 2010-12-11 NOTE — Letter (Signed)
Summary: [facility]   Imported By: Sherian Rein 06/06/2010 10:00:46  _____________________________________________________________________  External Attachment:    Type:   Image     Comment:   External Document

## 2010-12-11 NOTE — Assessment & Plan Note (Signed)
Summary: MED CAL AND [facility]AG[facility] PT'S MEDS PER MR / CJ   Copy to:  Dr. Patty Sermons Primary Provider/Referring Provider:  Dr. Lupe Carney  PMD, Dr. Patty Sermons - Cards, Dr Ranee Gosselin [facility]  CC:  followup.  History of Present Illness:  75 year old female ex-30pack smoker( quit 2007) with dyspnea mainly due to mitral reugurg and associated pulmonary hypertension. Possible but unclear if she has COPD/asthma based on hx (see past  puml hx section).   OV June 2011: I am sseeing [place] after many months. Last visit was Oct 12, 2009. Her husband passed away in interim 1 month ago and she has been very depressed. Crying a lot. Children live elsehwere in [place] and she is unwilling to live with them. Feeling very lonely. Main pulmonary issues is that she wants pulmonary clearnace before knee surgery by Dr. Darrelyn Hillock. She is being bothered by chronic OA knee pain and worse last 6 months. IN terms of dyspnea this persists. Since husband's death dyspnea is worse esp with crying, anxious spells. There is associated wheeze but not cough, hemoptysis, chest pain, edema orthopnea, paroxysmal noctural dyspnea. Of note, she conntinues to be on ACE inhibitor and non specific beta blocker   May 07, 2010--Presents for follow up and med review. She was seen last visit for preop clearance for knee surgery. No change in level of dyspnea. Last visit she was recommended to change her off ACE to ARB and to choose more selective beta blocker such as bisoprolol. Dr. Marchelle Gearing suggested this to her cardilogy to make changes however pt returns today having stopped ACE yesterday and remains on  Atenolol. She has not scheduled surgery as of yet. Denies chest pain, orthopnea, hemoptysis, fever, n/v/d, edema, headache.        Preventive Screening-Counseling & Management  Alcohol-Tobacco     Smoking Status: quit > 6 months     Year Quit: 2005  Current Medications (verified): 1)  Clonazepam 0.5 Mg Tabs (Clonazepam)  .... 1/2 By Mouth As Needed 2)  K-Lor 20 Meq Pack (Potassium Chloride) .... As Directed With Lasix 3)  Calcium Carbonate 600 Mg Tabs (Calcium Carbonate) .... Take 1 Tablet By Mouth Once A Day 4)  Sertraline Hcl 100 Mg Tabs (Sertraline Hcl) .... Take 1 Tablet By Mouth Two Times A Day 5)  Magnesium 200 Mg Tabs (Magnesium) .... Take 1 Tablet By Mouth Once A Day 6)  Gabapentin 300 Mg Caps (Gabapentin) .... One Tablet in The Am, 2 At Lunch, 2 At [employer] and 2 At Bedtime 7)  Fish Oil 1000 Mg Caps (Omega-3 Fatty Acids) .... Take 1 Tablet By Mouth Once A Day 8)  Warfarin Sodium 5 Mg Tabs (Warfarin Sodium) .... As Directed 9)  Furosemide 40 Mg Tabs (Furosemide) .... Take 1 Tablet By Mouth Once A Day 10)  Micardis Hct 80-25 Mg Tabs (Telmisartan-Hctz) .... Take 1 Tablet By Mouth Once A Day 11)  Cvs Vitamin D 2000 Unit Tabs (Cholecalciferol) .... Take 1 Tablet By Mouth Once A Day 12)  Atenolol 100 Mg Tabs (Atenolol) .... Take 1 Tablet By Mouth Three Times A Day 13)  Hydrocodone-Acetaminophen 10-325 Mg Tabs (Hydrocodone-Acetaminophen) .... One Tablet As Needed For Pain Every 4 Hours 14)  Simvastatin 10 Mg Tabs (Simvastatin) .... Take 1 Tablet By Mouth Once A Day 15)  Cyanocobalamin 2500 Mcg Subl (Cyanocobalamin) .... Once Daily 16)  Vitamin C 500 Mg Tabs (Ascorbic Acid) .... Take 1 Tablet By Mouth Once A Day 17)  Tums 500  Mg Chew (Calcium Carbonate Antacid) .... Per Bottle  Allergies (verified): 1)  ! Pcn 2)  ! Aspirin 3)  ! * Otc Pain Meds (Aleve)  Past History:  Past Medical History: Last updated: 06/23/2009 #Chronic sinus drainage - untratead #Hyperlipidemia #Hypertension #Chronic Atrial Fib, Hx of Bradycardia and Syncope >chronic coumadin, failed cardioversion by Bracbill 1998 >functional pacemaker by Dr Graciela Husbands 1998  #Hypertrophic Cardiomyopathy with PULM HTN >s/p pacer by Dr Harl Bowie cath per hx 1995 >ECHO9/23/2009 Dr Patty Sermons office - asymetric septal hypertrophy, mild AS, mild MR,  Moderate PAH 76   #TIA  #Admission CHF/"AE-COPD"  04/26/2008 - 04/29/2008  >BNP admit450 ->  112 at dc; received lasix and improved >also Rx as mild AE-COPD  # severe claudication angiogram in 04/19/2002, that showed occlusion in the right of   the superficial femoral arteries with 3-vessel runoff.  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  # history of vertigo and a  history of Bell palsy.  #severe chronic pain syndrome with chronic low  back pain.  # prior psychiatric history with depression as well   as severe anxiety.   #Labs 05/23/2009: Creat 0.9mg %, Lab 4.8gm%, hgb 14.5gm%, plat 231k. TSH 1.69  Past Surgical History: Last updated: 05/31/2009 Back Surgery-2005 Appendectomy Total Abdominal Hysterectomy Pacemaker: 1997/04/19, battery replaced 04-19-05  Appendectomy, tonsillectomy, partial  hysterectomy, surgery on the right foot, and left knee surgery.   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  She has had arthroscopic surgery of both knees.  Several D&Cs  after miscarriages in her early 74s, pacemaker implanted in 04/19/97.  She had a  hematoma after a heart cath and had to have surgery for that.  She has also  had a mediastinoscope which was negative.  Family History: Last updated: 05/31/2009 Mother-lymphoma Sister-breast CA  Family history is positive for heart disease, hypertension, diabetes,  cancer, stroke, arthritis, and bleeding disorder.  Social History: Last updated: 04/11/2010 Patient states former smoker.  Started smoking in late 04-20-2023, quit in 2006/04/19 average 1 pack a day.  widowed 2010/04/19 Has 2 children Retired She quit smoking   several years ago, formerly worked as a Lawyer.  No   significant alcohol usage  update 06/16/2009: Husband has lung cancer mets to brain now update 07-10-2011husband died 04-19-10. 5 grandkis. Living alone now. Does not want to move in with  daughters.   Past Pulmonary History:  Pulmonary History: #DYSPNEA eval > Hx: 30 pack ex smoker.Dyspnea since 04-20-2007. Triggered by bending over, class 2-3 exertion, needs to take deep breath, random features as well. also worse by pollen, dust. Associated cough and sinus drainage present.  > PFTs 06/23/2009: Mixed obstn- restriction on spiro, TLC 77% c/w mild restriction, low dlco 57% > Nuclear medicine stress test - 08/01/2009 - normal > Rt Heart cath 08/28/2009 - PA 70/26 (mean 44), PCWP 27. V wave 50- all pulm htn due to Mitral regurg > Stress ECHO 09/13/2009 - mitral regurg did not worsen with exercise perDr. Patty Sermons > [facility] desat test 05/20/2010- destaurated with exertion at 360 feet  #COUGH > hx obtained in August 2010 > sinus issues + > ACE inhibitor and non specific Beta blocker intake +  Social History: Smoking Status:  quit > 6 months  Review of Systems      See HPI  Vital Signs:  Patient profile:   75  year old female Height:      64 inches Weight:      147.31 pounds BMI:     25.38 O2 Sat:      94 % on Room air Pulse rate:   64 / minute BP sitting:   130 / 80  (right arm) Cuff size:   regular  Vitals Entered By: Kandice Hams CMA (May 07, 2010 3:16 PM)  O2 Flow:  Room air CC: followup   Physical Exam  Additional Exam:  GEN: A/Ox3; pleasant , NAD HEENT:  [place]/AT, , EACs-clear, TMs-wnl, NOSE-clear, THROAT-clear NECK:  Supple w/ fair ROM; no JVD; normal carotid impulses w/o bruits; no thyromegaly or nodules palpated; no lymphadenopathy. RESP  Coarse BS w/ no wheezing.  CARD:  RRR, no m/r/g   GI:   Soft & nt; nml bowel sounds; no organomegaly or masses detected. [place]: Warm bil,  no calf tenderness edema, clubbing, pulses intact    Impression & Recommendations:  Problem # 1:  SHORTNESS OF BREATH (ICD-786.05)  Will discuss w/ cardilogy Dr. Patty Sermons regarding her change to more selective beta blocker  REC:  Remain off lisinopril as directed by Dr.  Patty Sermons.  I will call over to Dr. Patty Sermons office concerning atenolol  change to Bisoprolol.   Return in 6-8 weeeks with Full PFT Breathing test and Dr. Marchelle Gearing  Hold off on knee surgery till then Come sooner if there are problems  Orders: Est. Patient Level II (40347)  Complete Medication List: 1)  Clonazepam 0.5 Mg Tabs (Clonazepam) .... 1/2 by mouth as needed 2)  K-lor 20 Meq Pack (Potassium chloride) .... As directed with lasix 3)  Calcium Carbonate 600 Mg Tabs (Calcium carbonate) .... Take 1 tablet by mouth once a day 4)  Sertraline Hcl 100 Mg Tabs (Sertraline hcl) .... Take 1 tablet by mouth two times a day 5)  Magnesium 200 Mg Tabs (Magnesium) .... Take 1 tablet by mouth once a day 6)  Gabapentin 300 Mg Caps (Gabapentin) .... One tablet in the am, 2 at lunch, 2 at dinner and 2 at bedtime 7)  Fish Oil 1000 Mg Caps (Omega-3 fatty acids) .... Take 1 tablet by mouth once a day 8)  Warfarin Sodium 5 Mg Tabs (Warfarin sodium) .... As directed 9)  Furosemide 40 Mg Tabs (Furosemide) .... Take 1 tablet by mouth once a day 10)  Micardis Hct 80-25 Mg Tabs (Telmisartan-hctz) .... Take 1 tablet by mouth once a day 11)  Cvs Vitamin D 2000 Unit Tabs (Cholecalciferol) .... Take 1 tablet by mouth once a day 12)  Atenolol 100 Mg Tabs (Atenolol) .... Take 1 tablet by mouth three times a day 13)  Hydrocodone-acetaminophen 10-325 Mg Tabs (Hydrocodone-acetaminophen) .... One tablet as needed for pain every 4 hours 14)  Simvastatin 10 Mg Tabs (Simvastatin) .... Take 1 tablet by mouth once a day 15)  Cyanocobalamin 2500 Mcg Subl (Cyanocobalamin) .... Once daily 16)  Vitamin C 500 Mg Tabs (Ascorbic acid) .... Take 1 tablet by mouth once a day 17)  Tums 500 Mg Chew (Calcium carbonate antacid) .... Per bottle  Patient Instructions: 1)  Remain off lisinopril as directed by Dr. Patty Sermons.  2)  I will call over to Dr. Patty Sermons office concerning atenolol  change to Bisoprolol.   3)  Return in 6-8 weeeks  with Full PFT Breathing test and Dr. Marchelle Gearing  4)  Hold off on knee surgery till then 5)  Come sooner if there are problems  Appended Document: MED CAL  AND [facility] PT'S MEDS PER MR / CJ spoke w/ Dashia , nurse at Dr. Patty Sermons office, will fax notes over for Dr. Patty Sermons to make med changes.  His Nurse Juliette Alcide to call back with any questions.   Appended Document: MED CAL AND CHAG[facility] PT'S MEDS PER MR / CJ faxed via biscom to Dr. Patty Sermons per TP's request.  Appended Document: MED CAL AND CHAG[facility] PT'S MEDS PER MR / CJ received call back from [facility] w/ Dr. Yevonne Pax office.  per Juliette Alcide, pt is cleared for knee replacement per Dr. Patty Sermons - clearance to be forwarded to otho per [facility].  also physician okayed for change from atenolol to bisoprolol, Dr. Patty Sermons to dose.  Juliette Alcide will call and leave msg for me with dosing per Dr. Yevonne Pax instructions to adjust pt's med list.  will forward to TP.

## 2010-12-11 NOTE — Miscellaneous (Signed)
Summary: Orders Update pft charges  Clinical Lists Changes  Orders: Added new Service order of Carbon Monoxide diffusing w/capacity (94720) - Signed Added new Service order of Lung Volumes (94240) - Signed Added new Service order of Spirometry (Pre & Post) (94060) - Signed 

## 2010-12-11 NOTE — Assessment & Plan Note (Signed)
Summary: Acute NP office visit - SOB   Copy to:  Dr. Patty Sermons Primary Provider/Referring Provider:  Dr. Lupe Carney  PMD, Dr. Patty Sermons - Cards, Dr Ranee Gosselin [facility], [facility] - [place] called Donnie Aho  CC:  increased SOB [phone] and wonders if this may b d/t new medication start.  History of Present Illness:  75 year old female ex-30pack smoker( quit 2007) with dyspnea mainly due to mitral reugurg and associated pulmonary hypertension. Possible but unclear if she has COPD/asthma based on hx (see past  puml hx section).   OV June 2011: I am sseeing [place] after many months. Last visit was 2009-10-08. Her husband passed away in interim 1 month ago and she has been very depressed. Crying a lot. Children live elsehwere in [place] and she is unwilling to live with them. Feeling very lonely. Main pulmonary issues is that she wants pulmonary clearnace before knee surgery by Dr. Darrelyn Hillock. She is being bothered by chronic OA knee pain and worse last 6 months. IN terms of dyspnea this persists. Since husband's death dyspnea is worse esp with crying, anxious spells. There is associated wheeze but not cough, hemoptysis, chest pain, edema orthopnea, paroxysmal noctural dyspnea. Of note, she conntinues to be on ACE inhibitor and non specific beta blocker  REC: STOP ACE INHBITOR AND LISINOPRIL AND THEN MED CALENDAR AND DO PFTS .  September 26, 2010--Presents for an acute office visit. Complains of increased SOB x10days. Was seen at Dr. Jenness Corner office yesterday for same problem. Labs were done. REcords were obtained and reviewed. Her bisoprolol was decreased  and lasix was increased to every other day . Labs -cbc, cmet were essentially unrevealing, INR was 3.1 BNP was 265 . According to records she has O2 at home . Denies chest pain, dyspnea, orthopnea, hemoptysis, fever, n/v/d, edema, headache. Wears out easily and very tired alot.       ov 05/23/2010: fOLLOWUP DYSPNEA and COUGH. Since last  visit has made changes to meds. She is off ace inibitor and atenolol.She is on micardis and bisoprolol now. STill no improvement in class  2 dyspnea byt cough is resolved.. This is chrnic and stable. Gets dyspneic walking to mailbox. She is more bothered by her knee and anxious for me to give her clearance for knee surgery. She also feels she is more tacycardic than baseline; due to see Dr. Patty Sermons tomorrow  Medications Prior to Update: 1)  Clonazepam 0.5 Mg Tabs (Clonazepam) .... 1/2 By Mouth As Needed 2)  K-Lor 20 Meq Pack (Potassium Chloride) .... As Directed With Lasix 3)  Calcium Carbonate 600 Mg Tabs (Calcium Carbonate) .... Take 1 Tablet By Mouth Once A Day 4)  Sertraline Hcl 100 Mg Tabs (Sertraline Hcl) .... Take 1 Tablet By Mouth Two Times A Day 5)  Magnesium 200 Mg Tabs (Magnesium) .... Take 1 Tablet By Mouth Once A Day 6)  Gabapentin 300 Mg Caps (Gabapentin) .... One Tablet in The Am, 2 At Lunch, 2 At [employer] and 2 At Bedtime 7)  Fish Oil 1000 Mg Caps (Omega-3 Fatty Acids) .... Take 1 Tablet By Mouth Once A Day 8)  Warfarin Sodium 5 Mg Tabs (Warfarin Sodium) .... As Directed 9)  Furosemide 40 Mg Tabs (Furosemide) .... Take 1 Tablet By Mouth Once A Day As Needed 10)  Micardis Hct 80-25 Mg Tabs (Telmisartan-Hctz) .... Take 1 Tablet By Mouth Once A Day 11)  Cvs Vitamin D 2000 Unit Tabs (Cholecalciferol) .... Take 1 Tablet By Mouth  Once A Day 12)  Bisoprolol Fumarate 10 Mg Tabs (Bisoprolol Fumarate) .[name].. 1 By Mouth Daily 13)  Hydrocodone-Acetaminophen 10-325 Mg Tabs (Hydrocodone-Acetaminophen) .... One Tablet As Needed For Pain Every 4 Hours 14)  Simvastatin 10 Mg Tabs (Simvastatin) .... Take 1 Tablet By Mouth Once A Day 15)  Cyanocobalamin 2500 Mcg Subl (Cyanocobalamin) .... Once Daily 16)  Vitamin C 500 Mg Tabs (Ascorbic Acid) .... Take 1 Tablet By Mouth Once A Day 17)  Tums 500 Mg Chew (Calcium Carbonate Antacid) .... Per Bottle  Current Medications (verified): 1)  Clonazepam 0.5 Mg  Tabs (Clonazepam) .... 1/2 By Mouth As Needed 2)  Klor-Con M20 20 Meq Cr-Tabs (Potassium Chloride Crys Cr) .... Take As Directed W/ Lasix 3)  Calcium Carbonate 600 Mg Tabs (Calcium Carbonate) .... Take 1 Tablet By Mouth Once A Day 4)  Magnesium 200 Mg Tabs (Magnesium) .... Take 1 Tablet By Mouth Once A Day 5)  Gabapentin 300 Mg Caps (Gabapentin) .... One Tablet in The Am, 2 At Lunch, 2 At [employer] and 2 At Bedtime 6)  Fish Oil 1000 Mg Caps (Omega-3 Fatty Acids) .... Take 1 Tablet By Mouth Once A Day 7)  Warfarin Sodium 5 Mg Tabs (Warfarin Sodium) .... As Directed 8)  Furosemide 40 Mg Tabs (Furosemide) .... Take 1 Tablet By Mouth Once A Day As Needed 9)  Micardis 40 Mg Tabs (Telmisartan) .... Take 1 Tablet By Mouth Once A Day 10)  Cvs Vitamin D 2000 Unit Tabs (Cholecalciferol) .... Take 1 Tablet By Mouth Once A Day 11)  Bisoprolol Fumarate 10 Mg Tabs (Bisoprolol Fumarate) .... Take 1 Tab By Mouth At Bedtime 12)  Hydrocodone-Acetaminophen 10-325 Mg Tabs (Hydrocodone-Acetaminophen) .... One Tablet As Needed For Pain Every 4-6 Hours 13)  Simvastatin 10 Mg Tabs (Simvastatin) .... Take 1 Tablet By Mouth Once A Day 14)  Cyanocobalamin 2500 Mcg Subl (Cyanocobalamin) .... Once Daily 15)  Vitamin C 500 Mg Tabs (Ascorbic Acid) .... Take 1 Tablet By Mouth Once A Day 16)  Tums 500 Mg Chew (Calcium Carbonate Antacid) .... Per Bottle 17)  Lexapro 20 Mg Tabs (Escitalopram Oxalate) .... Take 1 Tablet By Mouth Once A Day 18)  Hydrochlorothiazide 25 Mg Tabs (Hydrochlorothiazide) .... Take 1 Tablet By Mouth Once A Day 19)  Diltiazem Hcl 120 Mg Tabs (Diltiazem Hcl) .... Take 1 Tablet By Mouth Once A Day 20)  Pepcid 20 Mg Tabs (Famotidine) .... Take 1 Tab By Mouth At Bedtime 21)  Alprazolam 0.25 Mg Tabs (Alprazolam) .... Take 1 Tablet By Mouth Once A Day 22)  Triamcinolone Acetonide 0.1 % Crea (Triamcinolone Acetonide) .... Apply Two Times A Day As Directed By Dr. Bufford Buttner  Allergies (verified): 1)  !  Pcn 2)  ! Aspirin 3)  ! * Otc Pain Meds (Aleve)  Past History:  Past Medical History: Last updated: 06/23/2009 #Chronic sinus drainage - untratead #Hyperlipidemia #Hypertension #Chronic Atrial Fib, Hx of Bradycardia and Syncope >chronic coumadin, failed cardioversion by Bracbill 1998 >functional pacemaker by Dr Graciela Husbands 1998  #Hypertrophic Cardiomyopathy with PULM HTN >s/p pacer by Dr Harl Bowie cath per hx 1995 >ECHO9/23/2009 Dr Patty Sermons office - asymetric septal hypertrophy, mild AS, mild MR, Moderate PAH 76   #TIA  #Admission CHF/"AE-COPD"  04/26/2008 - 04/29/2008  >BNP admit450 ->  112 at dc; received lasix and improved >also Rx as mild AE-COPD  # severe claudication angiogram in 2003, that showed occlusion in the right of   the superficial femoral arteries with 3-vessel runoff.  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  # history of vertigo and a  history of Bell palsy.  #severe chronic pain syndrome with chronic low  back pain.  # prior psychiatric history with depression as well   as severe anxiety.   #Labs 05/23/2009: Creat 0.9mg %, Lab 4.8gm%, hgb 14.5gm%, plat 231k. TSH 1.69  Past Surgical History: Last updated: 05/31/2009 Back Surgery-2005 Appendectomy Total Abdominal Hysterectomy Pacemaker: 01-Apr-1997, battery replaced 04-01-05  Appendectomy, tonsillectomy, partial  hysterectomy, surgery on the right foot, and left knee surgery.   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  She has had arthroscopic surgery of both knees.  Several D&Cs  after miscarriages in her early 25s, pacemaker implanted in 1997-04-01.  She had a  hematoma after a heart cath and had to have surgery for that.  She has also  had a mediastinoscope which was negative.  Family History: Last updated: 05/31/2009 Mother-lymphoma Sister-breast CA  Family history is positive for heart disease, hypertension,  diabetes,  cancer, stroke, arthritis, and bleeding disorder.  Social History: Last updated: 09/26/2010 Patient states former smoker.  Started smoking in late 04/02/2023, quit in 2006-04-01 average 1 pack a day.  widowed 04/01/2010 Has 2 children Retired She quit smoking   several years ago, formerly worked as a Lawyer.  No   significant alcohol usage declines flu shot 09-26-10  update 06/16/2009: Husband has lung cancer mets to brain now update June 22, 2011husband died 04-01-10. 5 grandkis. Living alone now. Does not want to move in with daughters.   Risk Factors: Smoking Status: quit > 6 months (05/23/2010) Packs/Day: 0.5 (05/23/2010)  Past Pulmonary History:  Pulmonary History: #DYSPNEA eval > Hx: 30 pack ex smoker.Dyspnea since 02-Apr-2007. Triggered by bending over, class 2-3 exertion, needs to take deep breath, random features as well. also worse by pollen, dust. Associated cough and sinus drainage present.  > PFTs 06/23/2009: Mixed obstn- restriction on spiro, TLC 77% c/w mild restriction, low dlco 57% > Nuclear medicine stress test - 08/01/2009 - normal > Rt Heart cath 08/28/2009 - PA 70/26 (mean 44), PCWP 27. V wave 50- all pulm htn due to Mitral regurg > Stress ECHO 09/13/2009 - mitral regurg did not worsen with exercise perDr. Patty Sermons > Walkng desat test 05/02/2010- destaurated with exertion at 360 feet > PFT 05/23/2010 - Gold stage 2d COPD- Fev1 1.5L/75%, FVC 2.1L/77%, Ratop 69,  TLC 96%, DLCO 9.5/50%  #COUGH > hx obtained in August 2010 > sinus issues + > ACE inhibitor and non specific Beta blocker intake + > Resolved 05/23/2010 after stoppinboth  Social History: Patient states former smoker.  Started smoking in late 04/02/23, quit in 04/01/2006 average 1 pack a day.  widowed 04-01-2010 Has 2 children Retired She quit smoking   several years ago, formerly worked as a Lawyer.  No   significant alcohol usage declines flu shot 09-26-10  update 06/16/2009: Husband has lung cancer  mets to brain now update 06-22-11husband died 01-Apr-2010. 5 grandkis. Living alone now. Does not want to move in with daughters.   Review of Systems      See HPI  Vital Signs:  Patient profile:   75 year old female Height:      64 inches Weight:      151 pounds BMI:     26.01 O2 Sat:      92 % on  Room air Temp:     96.9 degrees F oral Pulse rate:   63 / minute BP sitting:   122 / 64  (right arm) Cuff size:   regular  Vitals Entered By: Boone Master CNA/MA (September 26, 2010 12:10 PM)  O2 Flow:  Room air CC: increased SOB x10days, wonders if this may b d/t new medication start Is Patient Diabetic? No Comments Medications reviewed with patient Daytime contact number verified with patient. Boone Master CNA/MA  September 26, 2010 12:11 PM    Physical Exam  Additional Exam:  GEN: A/Ox3; pleasant , NAD HEENT:  [place]/AT, , EACs-clear, TMs-wnl, NOSE-clear, THROAT-clear NECK:  Supple w/ fair ROM; no JVD; normal carotid impulses w/o bruits; no thyromegaly or nodules palpated; no lymphadenopathy. RESP  Coarse BS w/ no wheezing.  CARD:  RRR, no m/r/g   GI:   Soft & nt; nml bowel sounds; no organomegaly or masses detected. Musco: Warm bil,  no calf tenderness edema, clubbing, pulses intact    Impression & Recommendations:  Problem # 1:  CHRONIC OBSTRUCTIVE PULMONARY DISEASE, MODERATE (ICD-496) xray pending.  I have reviewed labs and notes form Cardiology. Have advised her to cont  w/ recent changes in diuretic and beta bocker and see if this helps.  Plan:  Stop your fish oil  for now  I will call with xray results.  I wil get labs from Dr. Yevonne Pax office follow up Dr. Marchelle Gearing in 1 week.  Please contact office for sooner follow up if symptoms do not improve or worsen   Medications Added to Medication List This Visit: 1)  Klor-con M20 20 Meq Cr-tabs (Potassium chloride crys cr) .... Take as directed w/ lasix 2)  Micardis 40 Mg Tabs (Telmisartan) .... Take 1 tablet by mouth  once a day 3)  Bisoprolol Fumarate 10 Mg Tabs (Bisoprolol fumarate) .... Take 1 tab by mouth at bedtime 4)  Hydrocodone-acetaminophen 10-325 Mg Tabs (Hydrocodone-acetaminophen) .... One tablet as needed for pain every 4-6 hours 5)  Lexapro 20 Mg Tabs (Escitalopram oxalate) .... Take 1 tablet by mouth once a day 6)  Hydrochlorothiazide 25 Mg Tabs (Hydrochlorothiazide) .... Take 1 tablet by mouth once a day 7)  Diltiazem Hcl 120 Mg Tabs (Diltiazem hcl) .... Take 1 tablet by mouth once a day 8)  Pepcid 20 Mg Tabs (Famotidine) .... Take 1 tab by mouth at bedtime 9)  Alprazolam 0.25 Mg Tabs (Alprazolam) .... Take 1 tablet by mouth once a day 10)  Triamcinolone Acetonide 0.1 % Crea (Triamcinolone acetonide) .... Apply two times a day as directed by dr. Bufford Buttner  Complete Medication List: 1)  Clonazepam 0.5 Mg Tabs (Clonazepam) .... 1/2 by mouth as needed 2)  Klor-con M20 20 Meq Cr-tabs (Potassium chloride crys cr) .... Take as directed w/ lasix 3)  Calcium Carbonate 600 Mg Tabs (Calcium carbonate) .... Take 1 tablet by mouth once a day 4)  Magnesium 200 Mg Tabs (Magnesium) .... Take 1 tablet by mouth once a day 5)  Gabapentin 300 Mg Caps (Gabapentin) .... One tablet in the am, 2 at lunch, 2 at dinner and 2 at bedtime 6)  Fish Oil 1000 Mg Caps (Omega-3 fatty acids) .... Take 1 tablet by mouth once a day 7)  Warfarin Sodium 5 Mg Tabs (Warfarin sodium) .... As directed 8)  Furosemide 40 Mg Tabs (Furosemide) .... Take 1 tablet by mouth once a day as needed 9)  Micardis 40 Mg Tabs (Telmisartan) .... Take 1 tablet by mouth once a  day 10)  Cvs Vitamin D 2000 Unit Tabs (Cholecalciferol) .... Take 1 tablet by mouth once a day 11)  Bisoprolol Fumarate 10 Mg Tabs (Bisoprolol fumarate) .... Take 1 tab by mouth at bedtime 12)  Hydrocodone-acetaminophen 10-325 Mg Tabs (Hydrocodone-acetaminophen) .... One tablet as needed for pain every 4-6 hours 13)  Simvastatin 10 Mg Tabs (Simvastatin) .... Take 1  tablet by mouth once a day 14)  Cyanocobalamin 2500 Mcg Subl (Cyanocobalamin) .... Once daily 15)  Vitamin C 500 Mg Tabs (Ascorbic acid) .... Take 1 tablet by mouth once a day 16)  Tums 500 Mg Chew (Calcium carbonate antacid) .... Per bottle 17)  Lexapro 20 Mg Tabs (Escitalopram oxalate) .... Take 1 tablet by mouth once a day 18)  Hydrochlorothiazide 25 Mg Tabs (Hydrochlorothiazide) .... Take 1 tablet by mouth once a day 19)  Diltiazem Hcl 120 Mg Tabs (Diltiazem hcl) .... Take 1 tablet by mouth once a day 20)  Pepcid 20 Mg Tabs (Famotidine) .... Take 1 tab by mouth at bedtime 21)  Alprazolam 0.25 Mg Tabs (Alprazolam) .... Take 1 tablet by mouth once a day 22)  Triamcinolone Acetonide 0.1 % Crea (Triamcinolone acetonide) .... Apply two times a day as directed by dr. Bufford Buttner  Other Orders: T-2 View CXR (71020TC) Est. Patient Level IV (83382)  Patient Instructions: 1)  Stop your fish oil  for now  2)  I will call with xray results.  3)  I wil get labs from Dr. Yevonne Pax office 4)  follow up Dr. Marchelle Gearing in 1 week.  5)  Please contact office for sooner follow up if symptoms do not improve or worsen

## 2010-12-11 NOTE — Assessment & Plan Note (Signed)
Summary: ROV 2 WKS ///KP   Visit Type:  Follow-up Copy to:  Dr. Patty Sermons Primary Provider/Referring Provider:  Dr. Lupe Carney  PMD, Dr. Patty Sermons - Cards, Dr Ranee Gosselin [facility], Psych - [place] called Donnie Aho  CC:  3 month follow-up.[name]  History of Present Illness:  75 year old female ex-30pack smoker( quit 2007) with dyspnea y due to mitral reugurg with associated pulmonary hypertension (Rt heart Cath 08/28/2009). Also, Gold stage 2 COPD (Pfts July 2011 - Fev1 1.%L/75%, DLCO 9.5/50%) with exertional hyoxoemia  OV June 2011: I am sseeing [place] after many months. Last visit was 10-13-2009. Her husband passed away in interim 1 month ago and she has been very depressed. Crying a lot. Children live elsehwere in state and she is unwilling to live with them. Feeling very lonely. Main pulmonary issues is that she wants pulmonary clearnace before knee surgery by Dr. Darrelyn Hillock. She is being bothered by chronic OA knee pain and worse last 6 months. IN terms of dyspnea this persists. Since husband's death dyspnea is worse esp with crying, anxious spells. There is associated wheeze but not cough, hemoptysis, chest pain, edema orthopnea, paroxysmal noctural dyspnea. Of note, she conntinues to be on ACE inhibitor and non specific beta blocker  REC: STOP ACE INHBITOR AND LISINOPRIL AND THEN MED CALENDAR AND DO PFTS .  ov 05/23/2010: fOLLOWUP DYSPNEA and COUGH. Since last visit has made changes to meds. She is off ace inibitor and atenolol.She is on micardis and bisoprolol now. STill no improvement in class  2 dyspnea byt cough is resolved.. This is chrnic and stable. Gets dyspneic walking to mailbox. She is more bothered by her knee and anxious for me to give her clearance for knee surgery.  September 26, 2010--Presents for an acute office visit. Complains of increased SOB x10days. Was seen at Dr. Jenness Corner office yesterday for same problem. Labs were done. REcords were obtained and reviewed.  Her bisoprolol was decreased  and lasix was increased to every other day . Labs -cbc, cmet were essentially unrevealing, INR was 3.1 BNP was 265 . According to records she has O2 at home . Denies chest pain, dyspnea, orthopnea, hemoptysis, fever, n/v/d, edema, headache. Wears out easily and very tired alot.         October 08, 2010 Followup Multifactorial dyspnea (COPD Gold stage 2 and Pulm htn due to Mitral regurg). Have not seen her since July 2011. She has had her knee surgery (Rt TKR). This has helped rt knee pain but not increased mobility. Still with class 2 dyspnea. Desaturated to 87% with walking 2nd lap. She saw Dr. Patty Sermons 09/25/2010 who feels pulmonary input needed to optimize dyspnea. Per patent surigical intervention for Mitral regurg not discussed. BNP was in 200s but otherwise labs was normal. CXR 09/26/2010 was clear.  She denies cough, fever, chest pains. Of note, she is NOT taking her spiriva which I advised in July 2011. This is because she saw TV ads about spiriva and got 'scared' about side effects which she cannot recollect.    Preventive Screening-Counseling & Management  Alcohol-Tobacco     Smoking Status: quit > 6 months     Packs/Day: 0.5     Year Started: 1953     Year Quit: 2005     Pack years: 25  Current Medications (verified): 1)  Clonazepam 0.5 Mg Tabs (Clonazepam) .... 1/2 By Mouth As Needed 2)  Klor-Con M20 20 Meq Cr-Tabs (Potassium Chloride Crys Cr) .... Take  As Directed W/ Lasix 3)  Calcium Carbonate 600 Mg Tabs (Calcium Carbonate) .... Take 1 Tablet By Mouth Once A Day 4)  Magnesium 200 Mg Tabs (Magnesium) .... Take 1 Tablet By Mouth Once A Day 5)  Gabapentin 300 Mg Caps (Gabapentin) .... One Tablet in The Am, 2 At Lunch, 2 At [employer] and 2 At Bedtime 6)  Warfarin Sodium 5 Mg Tabs (Warfarin Sodium) .... As Directed 7)  Furosemide 40 Mg Tabs (Furosemide) .... Take 1 Tablet By Mouth Once A Day As Needed 8)  Micardis 40 Mg Tabs (Telmisartan) .... Take 1  Tablet By Mouth Once A Day 9)  Cvs Vitamin D 2000 Unit Tabs (Cholecalciferol) .... Take 1 Tablet By Mouth Once A Day 10)  Bisoprolol Fumarate 10 Mg Tabs (Bisoprolol Fumarate) .... Take 1 Tab By Mouth At Bedtime 11)  Hydrocodone-Acetaminophen 10-325 Mg Tabs (Hydrocodone-Acetaminophen) .... One Tablet As Needed For Pain Every 4-6 Hours 12)  Simvastatin 10 Mg Tabs (Simvastatin) .... Take 1 Tablet By Mouth Once A Day 13)  Cyanocobalamin 2500 Mcg Subl (Cyanocobalamin) .... Once Daily 14)  Vitamin C 500 Mg Tabs (Ascorbic Acid) .... Take 1 Tablet By Mouth Once A Day 15)  Tums 500 Mg Chew (Calcium Carbonate Antacid) .... Per Bottle 16)  Zoloft 100 Mg Tabs (Sertraline Hcl) .... Take 1 Tablet By Mouth Once A Day 17)  Hydrochlorothiazide 25 Mg Tabs (Hydrochlorothiazide) .... Take 1 Tablet By Mouth Once A Day 18)  Diltiazem Hcl 120 Mg Tabs (Diltiazem Hcl) .... Take 1 Tablet By Mouth Once A Day 19)  Pepcid 20 Mg Tabs (Famotidine) .... Take 1 Tab By Mouth At Bedtime 20)  Alprazolam 0.25 Mg Tabs (Alprazolam) .... Take 1 Tablet By Mouth Once A Day 21)  Triamcinolone Acetonide 0.1 % Crea (Triamcinolone Acetonide) .... Apply Two Times A Day As Directed By Dr. Bufford Buttner  Allergies (verified): 1)  ! Pcn 2)  ! Aspirin 3)  ! * Otc Pain Meds (Aleve)  Past History:  Past medical, surgical, family and social histories (including risk factors) reviewed, and no changes noted (except as noted below).  Past Medical History: #COPD - quit smoking 2007  - Gold Stag2 - PFT July 2011: FEv1 1.5L/75%, RAtip 69, DLCO 9.5/50%  - desaturation to 87% after 185 feet x 2 #ACE INHIBITOR COUGH  - June 2011. Resolved #Chronic sinus drainage  #Hyperlipidemia #Hypertension #Chronic Atrial Fib, Hx of Bradycardia and Syncope  -chronic coumadin, failed cardioversion by Bracbill 1998  -functional pacemaker by Dr Graciela Husbands 1998  #Hypertrophic Cardiomyopathy with PULM HTN  -s/p pacer by Dr Graciela Husbands  normal cath per hx 1995   -ECHO9/23/2009 Dr Patty Sermons office - asymetric septal hypertrophy, mild AS, mild MR, Moderate PAH 74  - Rt heart cath 08/28/2009 - RAP 10, PAP mean 36, PCWP 19 with V waves 35, PVR 4.1, CI 1.9   #TIA  #Admission CHF/"AE-COPD"  04/26/2008 - 04/29/2008   BNP admit450 ->  112 at dc; received lasix and improved  -also Rx as mild AE-COPD  # severe claudication angiogram in 2003, that showed occlusion in the right of   the superficial femoral arteries with 3-vessel runoff.  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  # history of vertigo and a  history of Bell palsy.  #severe chronic pain syndrome with chronic low  back pain.  # prior psychiatric history with depression as well   as severe anxiety.   #Labs 05/23/2009: Creat 0.9mg %, Lab 4.8gm%, hgb 14.5gm%, plat 231k. TSH 1.69  Past Surgical History: Reviewed history from 05/31/2009 and no changes required. Back Surgery-2005 Appendectomy Total Abdominal Hysterectomy Pacemaker: 03-Apr-1997, battery replaced 04-03-05  Appendectomy, tonsillectomy, partial  hysterectomy, surgery on the right foot, and left knee surgery.   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  She has had arthroscopic surgery of both knees.  Several D&Cs  after miscarriages in her early 64s, pacemaker implanted in 04-03-1997.  She had a  hematoma after a heart cath and had to have surgery for that.  She has also  had a mediastinoscope which was negative.  Past Pulmonary History:  Pulmonary History: #DYSPNEA eval > Hx: 30 pack ex smoker.Dyspnea since 04-04-2007. Triggered by bending over, class 2-3 exertion, needs to take deep breath, random features as well. also worse by pollen, dust. Associated cough and sinus drainage present.  > PFTs 06/23/2009: Mixed obstn- restriction on spiro, TLC 77% c/w mild restriction, low dlco 57% > Nuclear medicine stress test - 08/01/2009 - normal > Rt Heart cath  08/28/2009 - PA 70/26 (mean 44), PCWP 27. V wave 50- all pulm htn due to Mitral regurg > Stress ECHO 09/13/2009 - mitral regurg did not worsen with exercise perDr. Patty Sermons > Walkng desat test 05-04-2010- destaurated with exertion at 360 feet > PFT 05/23/2010 - Gold stage 2d COPD- Fev1 1.5L/75%, FVC 2.1L/77%, Ratop 69,  TLC 96%, DLCO 9.5/50%  #COUGH > hx obtained in August 2010 > sinus issues + > ACE inhibitor and non specific Beta blocker intake + > Resolved 05/23/2010 after stoppinboth  Family History: Reviewed history from 05/31/2009 and no changes required. Mother-lymphoma Sister-breast CA  Family history is positive for heart disease, hypertension, diabetes,  cancer, stroke, arthritis, and bleeding disorder.  Social History: Reviewed history from 09/26/2010 and no changes required. Patient states former smoker.  Started smoking in late 2023-04-04, quit in 04-03-06 average 1 pack a day.  widowed 04/03/2010 Has 2 children Retired She quit smoking   several years ago, formerly worked as a Lawyer.  No   significant alcohol usage declines flu shot 09-26-10  update 06/16/2009: Husband has lung cancer mets to brain now update June 24, 2011husband died 2010/04/03. 5 grandkis. Living alone now. Does not want to move in with daughters. Pack years:  25  Review of Systems       The patient complains of shortness of breath with activity and shortness of breath at rest.  The patient denies productive cough, non-productive cough, coughing up blood, chest pain, irregular heartbeats, acid heartburn, indigestion, loss of appetite, weight change, abdominal pain, difficulty swallowing, sore throat, tooth/dental problems, headaches, nasal congestion/difficulty breathing through nose, sneezing, itching, ear ache, anxiety, depression, hand/feet swelling, joint stiffness or pain, rash, change in color of mucus, and fever.    Vital Signs:  Patient profile:   75 year old female Height:      64 inches Weight:       152.50 pounds BMI:     26.27 O2 Sat:      92 % Temp:     97.7 degrees F oral Pulse rate:   61 / minute BP sitting:   110 / 70  (right arm) Cuff size:   regular  Vitals Entered By: Carron Curie CMA (October 08, 2010 10:56 AM) CC: 3 month follow-up. Comments Medications reviewed with patient Carron Curie CMA  October 08, 2010 10:58 AM Daytime phone  number verified with patient.    Physical Exam  General:  well developed, well nourished, in no acute distress Head:  normocephalic and atraumatic Eyes:  PERRLA/EOM intact; conjunctiva and sclera clear Ears:  TMs intact and clear with normal canals Nose:  no deformity, discharge, inflammation, or lesions Mouth:  no deformity or lesions Neck:  no masses, thyromegaly, or abnormal cervical nodes Chest Wall:  no deformities noted Lungs:  clear bilaterally to auscultation and percussion Heart:  regular rate and rhythm, S1, S2 without murmurs, rubs, gallops, or clicks Abdomen:  bowel sounds positive; abdomen soft and non-tender without masses, or organomegaly Msk:  no deformity or scoliosis noted with normal posture but gait is antalgic to due to knee pain Pulses:  pulses normal Extremities:  no clubbing, cyanosis, edema, or deformity noted Neurologic:  CN II-XII grossly intact with normal reflexes, coordination, muscle strength and tone Skin:  intact without lesions or rashes Cervical Nodes:  no significant adenopathy Axillary Nodes:  no significant adenopathy Psych:  depressed affect.  but less than before   Impression & Recommendations:  Problem # 1:  CHRONIC OBSTRUCTIVE PULMONARY DISEASE, MODERATE (ICD-496) Assessment Unchanged stil with class 2 dyspnea. The only thing as pulmonary I could fix is optimizing Rx for COPD. She is also nocompliant iwth o2.  I hada frank discussion with her esp about spiriva side effects. she has agreed to try spiriva out. Samples given. MDI tech taught. Will see her in 6 weeks to see  progress. AT fu witll decide on Alpha 1 at testing and rehab. For rehab, will need cards clearrance. Also, need to see if she is a surgical candidate for heart valve regurg  Problem # 2:  SHORTNESS OF BREATH (ICD-786.05) Assessment: Unchanged  . Dysponea is due to this (quit smoking 2007), deconditoning and pulmonary hypertension due to severe MR  plan per COPD section  Orders: Est. Patient Level III (04540) Prescription Created Electronically [phone]) HFA Instruction [phone])  Medications Added to Medication List This Visit: 1)  Zoloft 100 Mg Tabs (Sertraline hcl) .... Take 1 tablet by mouth once a day 2)  Spiriva Handihaler 18 Mcg Caps (Tiotropium bromide monohydrate) .... One puffs in handihaler daily  Patient Instructions: 1)  you really need to try spiriva 2)   take 1 puf daily 3)  take 2 samples and learn technique 4)  use oxgen for walking anything more than 200 feet 5)  return to see me in 6 weeks to report how your shortness of breath is Prescriptions: SPIRIVA HANDIHALER 18 MCG  CAPS (TIOTROPIUM BROMIDE MONOHYDRATE) one puffs in handihaler daily  #1 x 6   Entered and Authorized by:   Kalman Shan MD   Signed by:   Kalman Shan MD on 10/08/2010   Method used:   Electronically to        [facility]  [address] [phone]* (retail)       [address]       [place], [place]  [address]       Ph: [phone]       Fax: [phone]   RxID:   [id number]   Appended Document: ROV 2 WKS ///KP Ambulatory Pulse Oximetry  Resting; HR_62____    02 Sat__93%RA___  Lap1 (185 feet)   HR_79____   02 Sat_88%____ Lap2 (185 feet)   HR_74____   02 Sat__87%___    Lap3 (185 feet)   HR_____   02 Sat_____  ___Test Completed without  Difficulty _X__Test Stopped due to: Pt c/o sob, knee pain 02 sats 87% Dr. Marchelle Gearing aware =Armita Trula Ore, LPN  October 08, 2010 12:26 PM    Clinical Lists Changes

## 2010-12-11 NOTE — Assessment & Plan Note (Signed)
Summary: surgery clearance per pt//kp   Visit Type:  Follow-up Copy to:  Dr. Patty Sermons Primary Provider/Referring Provider:  Dr. Lupe Carney  PMD, Dr. Patty Sermons - Cards, Dr Ranee Gosselin [facility]  CC:  surgical clearance for right knee replacement surgery - c/o increased SOB with activity and w/ anxiety.  pt's husband passed away 1 month ago.  History of Present Illness:  74 year old female ex-30pack smoker( quit 2007) with dyspnea mainly due to mitral reugurg and associated pulmonary hypertension. Possible but unclear if she has COPD/asthma based on hx (see past  puml hx section).   OV June 2011: I am sseeing [place] after many months. Last visit was 2009/10/13. Her husband passed away in interim 1 month ago and she has been very depressed. Crying a lot. Children live elsehwere [place] and she is unwilling to live with them. Feeling very lonely. Main pulmonary issues is that she wants pulmonary clearnace before knee surgery by Dr. Darrelyn Hillock. She is being bothered by chronic OA knee pain and worse last 6 months. IN terms of dyspnea this persists. Since husband's death dyspnea is worse esp with crying, anxious spells. There is associated wheeze but not cough, hemoptysis, chest pain, edema orthopnea, paroxysmal noctural dyspnea. Of note, she conntinues to be on ACE inhibitor and non specific beta blocker         Preventive Screening-Counseling & Management  Alcohol-Tobacco     Smoking Status: quit  Current Medications (verified): 1)  Clonazepam 0.5 Mg Tabs (Clonazepam) .... 1/2 By Mouth As Needed 2)  K-Lor 20 Meq Pack (Potassium Chloride) .... As Directed With Lasix 3)  Calcium Carbonate 600 Mg Tabs (Calcium Carbonate) .... Take 1 Tablet By Mouth Once A Day 4)  Sertraline Hcl 100 Mg Tabs (Sertraline Hcl) .... Take 1 Tablet By Mouth Two Times A Day 5)  Magnesium 200 Mg Tabs (Magnesium) .... Take 1 Tablet By Mouth Once A Day 6)  Gabapentin 300 Mg Caps (Gabapentin) .... One  Tablet in The Am, 2 At Lunch, 2 At [employer] and 2 At Bedtime 7)  Lisinopril 10 Mg Tabs (Lisinopril) .... Take 1 Tablet By Mouth Two Times A Day 8)  Fish Oil 1000 Mg Caps (Omega-3 Fatty Acids) .... Take 1 Tablet By Mouth Once A Day 9)  Warfarin Sodium 5 Mg Tabs (Warfarin Sodium) .... As Directed 10)  Furosemide 40 Mg Tabs (Furosemide) .... Take 1 Tablet By Mouth Once A Day 11)  Micardis Hct 80-25 Mg Tabs (Telmisartan-Hctz) .... Take 1 Tablet By Mouth Once A Day 12)  [employer] Vitamin D 2000 Unit Tabs (Cholecalciferol) .... Take 1 Tablet By Mouth Once A Day 13)  Atenolol 100 Mg Tabs (Atenolol) .... Take 1 Tablet By Mouth Three Times A Day 14)  Hydrocodone-Acetaminophen 10-325 Mg Tabs (Hydrocodone-Acetaminophen) .... One Tablet As Needed For Pain Every 4 Hours 15)  Simvastatin 10 Mg Tabs (Simvastatin) .... Take 1 Tablet By Mouth Once A Day 16)  Cyanocobalamin 2500 Mcg Subl (Cyanocobalamin) .... Once Daily 17)  Vitamin C 500 Mg Tabs (Ascorbic Acid) .... Take 1 Tablet By Mouth Once A Day 18)  Tums 500 Mg Chew (Calcium Carbonate Antacid) .... Per Bottle  Allergies (verified): 1)  ! Pcn 2)  ! Aspirin 3)  ! * Otc Pain Meds (Aleve)  Past History:  Family History: Last updated: 05/31/2009 Mother-lymphoma Sister-breast CA  Family history is positive for heart disease, hypertension, diabetes,  cancer, stroke, arthritis, and bleeding disorder.  Social History: Last updated:  04/11/2010 Patient states former smoker.  Started smoking in late 03/26/2023, quit in 03/25/2006 average 1 pack a day.  widowed Mar 25, 2010 Has 2 children Retired She quit smoking   several years ago, formerly worked as a Lawyer.  No   significant alcohol usage  update 06/16/2009: Husband has lung cancer mets to brain now update 06/15/2011husband died Mar 25, 2010. 5 grandkis. Living alone now. Does not want to move in with daughters.   Risk Factors: Smoking Status: quit (04/11/2010)  Past Medical History: Reviewed history from  06/23/2009 and no changes required. #Chronic sinus drainage - untratead #Hyperlipidemia #Hypertension #Chronic Atrial Fib, Hx of Bradycardia and Syncope >chronic coumadin, failed cardioversion by Bracbill 1998 >functional pacemaker by Dr Graciela Husbands 1998  #Hypertrophic Cardiomyopathy with PULM HTN >s/p pacer by Dr Harl Bowie cath per hx 1995 >ECHO9/23/2009 Dr Patty Sermons office - asymetric septal hypertrophy, mild AS, mild MR, Moderate PAH 76   #TIA  #Admission CHF/"AE-COPD"  04/26/2008 - 04/29/2008  >BNP admit450 ->  112 at dc; received lasix and improved >also Rx as mild AE-COPD  # severe claudication angiogram in 2002-03-25, that showed occlusion in the right of   the superficial femoral arteries with 3-vessel runoff.  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  # history of vertigo and a  history of Bell palsy.  #severe chronic pain syndrome with chronic low  back pain.  # prior psychiatric history with depression as well   as severe anxiety.   #Labs 05/23/2009: Creat 0.9mg %, Lab 4.8gm%, hgb 14.5gm%, plat 231k. TSH 1.69  Past Surgical History: Reviewed history from 05/31/2009 and no changes required. Back Surgery-2005 Appendectomy Total Abdominal Hysterectomy Pacemaker: 03/25/97, battery replaced 03-25-05  Appendectomy, tonsillectomy, partial  hysterectomy, surgery on the right foot, and left knee surgery.   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  She has had arthroscopic surgery of both knees.  Several D&Cs  after miscarriages in her early 27s, pacemaker implanted in 1997/03/25.  She had a  hematoma after a heart cath and had to have surgery for that.  She has also  had a mediastinoscope which was negative.  Past Pulmonary History:  Pulmonary History: #DYSPNEA eval > Hx: 30 pack ex smoker.Dyspnea since 03/26/2007. Triggered by bending over, class 2-3 exertion, needs to take deep breath, random  features as well. also worse by pollen, dust. Associated cough and sinus drainage present.  > PFTs 06/23/2009: Mixed obstn- restriction on spiro, TLC 77% c/w mild restriction, low dlco 57% > Nuclear medicine stress test - 08/01/2009 - normal > Rt Heart cath 08/28/2009 - PA 70/26 (mean 44), PCWP 27. V wave 50- all pulm htn due to Mitral regurg > Stress ECHO 09/13/2009 - mitral regurg did not worsen with exercise perDr. Patty Sermons > [facility] desat test April 25, 2010- destaurated with exertion at 360 feet  #COUGH > hx obtained in August 2010 > sinus issues + > ACE inhibitor and non specific Beta blocker intake +  Family History: Reviewed history from 05/31/2009 and no changes required. Mother-lymphoma Sister-breast CA  Family history is positive for heart disease, hypertension, diabetes,  cancer, stroke, arthritis, and bleeding disorder.  Social History: Patient states former smoker.  Started smoking in late 03-26-23, quit in 03/25/2006 average 1 pack a day.  widowed 03/25/2010 Has 2 children Retired She quit smoking   several years ago, formerly worked as a Lawyer.  No   significant alcohol usage  update 06/16/2009: Husband has lung cancer mets to brain now update June 21, 2011husband died Mar 31, 2010. 5 grandkis. Living alone now. Does not want to move in with daughters.   Review of Systems       The patient complains of shortness of breath with activity and anxiety.  The patient denies shortness of breath at rest, productive cough, non-productive cough, coughing up blood, chest pain, irregular heartbeats, acid heartburn, indigestion, loss of appetite, weight change, abdominal pain, difficulty swallowing, sore throat, tooth/dental problems, headaches, nasal congestion/difficulty breathing through nose, sneezing, itching, ear ache, depression, hand/feet swelling, joint stiffness or pain, rash, change in color of mucus, and fever.    Vital Signs:  Patient profile:   75 year old female Height:       64 inches Weight:      148 pounds BMI:     25.50 O2 Sat:      95 % on Room air Temp:     97.4 degrees F oral Pulse rate:   60 / minute BP sitting:   126 / 74  (left arm) Cuff size:   regular  Vitals Entered By: Boone Master CNA/MA (April 11, 2010 3:56 PM)  O2 Flow:  Room air CC: surgical clearance for right knee replacement surgery - c/o increased SOB with activity and w/ anxiety.  pt's husband passed away 1 month ago Comments Medications reviewed with patient Daytime contact number verified with patient. Boone Master CNA/MA  April 11, 2010 3:52 PM   Ambulatory Pulse Oximetry  Resting; HR__63___    02 Sat___95__  Lap1 (185 feet)   HR__76___   02 Sat__90___ Lap2 (185 feet)   HR__81___   02 Sat__88___    Lap3 (185 feet)   HR_____   02 Sat_____  ___Test Completed without Difficulty _X__Test Stopped due to: decreased sats and knee discomfort.  at the beginning of the 3rd lap, pt's sats decreased to 87% ra, but had increased to 89% by the time we reached the exam room.  Boone Master CNA/MA  April 11, 2010 4:14 PM    Physical Exam  General:  well developed, well nourished, in no acute distress Head:  normocephalic and atraumatic Eyes:  PERRLA/EOM intact; conjunctiva and sclera clear Ears:  TMs intact and clear with normal canals Nose:  no deformity, discharge, inflammation, or lesions Mouth:  no deformity or lesions Neck:  no masses, thyromegaly, or abnormal cervical nodes Chest Wall:  no deformities noted Lungs:  clear bilaterally to auscultation and percussion Heart:  regular rate and rhythm, S1, S2 without murmurs, rubs, gallops, or clicks Abdomen:  bowel sounds positive; abdomen soft and non-tender without masses, or organomegaly Msk:  no deformity or scoliosis noted with normal posture but gait is antalgic to due to knee pain Pulses:  pulses normal Extremities:  no clubbing, cyanosis, edema, or deformity noted Neurologic:  CN II-XII grossly intact with normal reflexes,  coordination, muscle strength and tone Skin:  intact without lesions or rashes Cervical Nodes:  no significant adenopathy Axillary Nodes:  no significant adenopathy Psych:  depressed affect, anxious, and poor historian.   crying +++   Impression & Recommendations:  Problem # 1:  PRE-OPERATIVE RESPIRATORY EXAMINATION (ICD-V72.82) Assessment Unchanged  I think she is at mild-moderate risk for pulmonary complications following knee surgery. However, social situation is not ideal for knee surgery now. She is depressed and grieving from husband's death.She lives alone. She agrees she might not be  motivated to rehab after surgery. I rcommend postponement of surgery for few months atlest till sadness is improved  Orders: Est. Patient Level IV (16109)  Problem # 2:  COUGH (ICD-786.2) Assessment: Improved  resolved  plan monitor if recures, need to tackle issue of fish oil, ace inhibitors and non specific betal blockers  Orders: Est. Patient Level IV ([id number])  Problem # 3:  SHORTNESS OF BREATH (ICD-786.05) Assessment: Deteriorated Symptomatically worse than last visit in Nov 2010. Unclear how much her depression is contriubitng to it. Most of dyspnea and exertional desat is from  [facility] related to severe mitral regurg. There might be compoment of asthma/copd based on hx of smoking and dyspnea made worse by pollen. However, she needs to come off ACE inhibitor and non specific beta blockers because this could be primary problem or worsening copd/asthma if it indeed exists.  I will attempt to contact Dr. Patty Sermons about this. She needs to be on an ARB and B1 sepcific beta blocker Orders: Pulse Oximetry, Ambulatory (09811) Est. Patient Level IV ([id number])  Problem # 4:  DEPRESSIVE DISORDER NOT ELSEWHERE CLASSIFIED (ICD-311) Assessment: New  worsening depression following husband's deat. WIll allert PMD via fax. Not suicidal or hallucinating  Orders: Est. Patient Level IV  ([id number])  Medications Added to Medication List This Visit: 1)  Sertraline Hcl 100 Mg Tabs (Sertraline hcl) .... Take 1 tablet by mouth two times a day 2)  Lisinopril 10 Mg Tabs (Lisinopril) .... Take 1 tablet by mouth two times a day 3)  Cyanocobalamin 2500 Mcg Subl (Cyanocobalamin) .... Once daily 4)  Vitamin C 500 Mg Tabs (Ascorbic acid) .... Take 1 tablet by mouth once a day 5)  Tums 500 Mg Chew (Calcium carbonate antacid) .... Per bottle  Patient Instructions: 1)  I want to change your lisinopril to ARB class of drugs 2)  Examples of ARB are cozaar, benicar etc., 3)  I also want to change your atenolol to bisoprolol 4)  Please check with Dr Patty Sermons office about doing this change 5)  We will call him as well 6)  I want this change made sometime between now and next month 7)  Return in 6-8 weeeks with Full PFT Breathing test 8)  Hold off on knee surgery till then 9)  Come sooner if there are problems     Appended Document: surgery clearance per pt//kp Candise Bowens, Pls call Dr. Patty Sermons office and say we want to change ace inhibitor to ARB and atenolol to lopressor or bisoprolol or bystolic. Let me know what he says. Thanks. MR  Appended Document: surgery clearance per pt//kp Spoke with Dr. Jenness Corner office; Ok for both per Dr. Patty Sermons  Appended Document: surgery clearance per pt//kp Haver her come in and see Tammy for med calnder and the change  Appended Document: surgery clearance per pt//kp Called, spoke with pt.  Pt informed MR requeting her to come in for med calander and to change med with TP.  OV scheduled with TP for June 27 at 3pm.  Pt instructed to bring all of her meds to the visit-OTC, scheduled, and as needed.  She verbalized understanding.

## 2010-12-11 NOTE — Assessment & Plan Note (Signed)
Summary: 6-8 weeks/apc   Visit Type:  Follow-up Copy to:  Dr. Patty Sermons Primary Provider/Referring Provider:  Dr. Lupe Carney  PMD, Dr. Patty Sermons - Cards, Dr Ranee Gosselin [facility], Psych - [place] called Donnie Aho  CC:  Pt here for follow up with PFT. Pt states is here for right knee replacement surgical clearance.  History of Present Illness:  75 year old female ex-30pack smoker( quit 2007) with dyspnea mainly due to mitral reugurg and associated pulmonary hypertension. Possible but unclear if she has COPD/asthma based on hx (see past  puml hx section).   OV June 2011: I am sseeing Streeter after many months. Last visit was 10-12-09. Her husband passed away in interim 1 month ago and she has been very depressed. Crying a lot. Children live elsehwere in state and she is unwilling to live with them. Feeling very lonely. Main pulmonary issues is that she wants pulmonary clearnace before knee surgery by Dr. Darrelyn Hillock. She is being bothered by chronic OA knee pain and worse last 6 months. IN terms of dyspnea this persists. Since husband's death dyspnea is worse esp with crying, anxious spells. There is associated wheeze but not cough, hemoptysis, chest pain, edema orthopnea, paroxysmal noctural dyspnea. Of note, she conntinues to be on ACE inhibitor and non specific beta blocker  REC: STOP ACE INHBITOR AND LISINOPRIL AND THEN MED CALENDAR AND DO PFTS .      ov 05/23/2010: fOLLOWUP DYSPNEA and COUGH. Since last visit has made changes to meds. She is off ace inibitor and atenolol.She is on micardis and bisoprolol now. STill no improvement in class  2 dyspnea byt cough is resolved.. This is chrnic and stable. Gets dyspneic walking to mailbox. She is more bothered by her knee and anxious for me to give her clearance for knee surgery. She also feels she is more tacycardic than baseline; due to see Dr. Patty Sermons tomorrow  Preventive Screening-Counseling & Management  Alcohol-Tobacco  Smoking Status: quit > 6 months     Packs/Day: 0.5     Year Started: 1953     Year Quit: 2005  Current Medications (verified): 1)  Clonazepam 0.5 Mg Tabs (Clonazepam) .... 1/2 By Mouth As Needed 2)  K-Lor 20 Meq Pack (Potassium Chloride) .... As Directed With Lasix 3)  Calcium Carbonate 600 Mg Tabs (Calcium Carbonate) .... Take 1 Tablet By Mouth Once A Day 4)  Sertraline Hcl 100 Mg Tabs (Sertraline Hcl) .... Take 1 Tablet By Mouth Two Times A Day 5)  Magnesium 200 Mg Tabs (Magnesium) .... Take 1 Tablet By Mouth Once A Day 6)  Gabapentin 300 Mg Caps (Gabapentin) .... One Tablet in The Am, 2 At Lunch, 2 At [employer] and 2 At Bedtime 7)  Fish Oil 1000 Mg Caps (Omega-3 Fatty Acids) .... Take 1 Tablet By Mouth Once A Day 8)  Warfarin Sodium 5 Mg Tabs (Warfarin Sodium) .... As Directed 9)  Furosemide 40 Mg Tabs (Furosemide) .... Take 1 Tablet By Mouth Once A Day As Needed 10)  Micardis Hct 80-25 Mg Tabs (Telmisartan-Hctz) .... Take 1 Tablet By Mouth Once A Day 11)  [facility] Vitamin D 2000 Unit Tabs (Cholecalciferol) .... Take 1 Tablet By Mouth Once A Day 12)  Bisoprolol Fumarate 10 Mg Tabs (Bisoprolol Fumarate) .[name].. 1 By Mouth Daily 13)  Hydrocodone-Acetaminophen 10-325 Mg Tabs (Hydrocodone-Acetaminophen) .... One Tablet As Needed For Pain Every 4 Hours 14)  Simvastatin 10 Mg Tabs (Simvastatin) .... Take 1 Tablet By Mouth Once A Day  15)  Cyanocobalamin 2500 Mcg Subl (Cyanocobalamin) .... Once Daily 16)  Vitamin C 500 Mg Tabs (Ascorbic Acid) .... Take 1 Tablet By Mouth Once A Day 17)  Tums 500 Mg Chew (Calcium Carbonate Antacid) .... Per Bottle  Allergies (verified): 1)  ! Pcn 2)  ! Aspirin 3)  ! * Otc Pain Meds (Aleve)  Past History:  Family History: Last updated: 05/31/2009 Mother-lymphoma Sister-breast CA  Family history is positive for heart disease, hypertension, diabetes,  cancer, stroke, arthritis, and bleeding disorder.  Social History: Last updated: 04/11/2010 Patient states former  smoker.  Started smoking in late 04/10/2023, quit in 04/09/2006 average 1 pack a day.  widowed 04-09-2010 Has 2 children Retired She quit smoking   several years ago, formerly worked as a Lawyer.  No   significant alcohol usage  update 06/16/2009: Husband has lung cancer mets to brain now update 06/30/2011husband died Apr 09, 2010. 5 grandkis. Living alone now. Does not want to move in with daughters.   Risk Factors: Smoking Status: quit > 6 months (05/23/2010) Packs/Day: 0.5 (05/23/2010)  Past Medical History: Reviewed history from 06/23/2009 and no changes required. #Chronic sinus drainage - untratead #Hyperlipidemia #Hypertension #Chronic Atrial Fib, Hx of Bradycardia and Syncope >chronic coumadin, failed cardioversion by Bracbill 1998 >functional pacemaker by Dr Graciela Husbands 1998  #Hypertrophic Cardiomyopathy with PULM HTN >s/p pacer by Dr Harl Bowie cath per hx 1995 >ECHO9/23/2009 Dr Patty Sermons office - asymetric septal hypertrophy, mild AS, mild MR, Moderate PAH 76   #TIA  #Admission CHF/"AE-COPD"  04/26/2008 - 04/29/2008  >BNP admit450 ->  112 at dc; received lasix and improved >also Rx as mild AE-COPD  # severe claudication angiogram in 04/09/2002, that showed occlusion in the right of   the superficial femoral arteries with 3-vessel runoff.  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  # history of vertigo and a  history of Bell palsy.  #severe chronic pain syndrome with chronic low  back pain.  # prior psychiatric history with depression as well   as severe anxiety.   #Labs 05/23/2009: Creat 0.9mg %, Lab 4.8gm%, hgb 14.5gm%, plat 231k. TSH 1.69  Past Surgical History: Reviewed history from 05/31/2009 and no changes required. Back Surgery-2005 Appendectomy Total Abdominal Hysterectomy Pacemaker: 04-09-1997, battery replaced 04-09-05  Appendectomy, tonsillectomy, partial  hysterectomy, surgery on the right foot, and left knee surgery.   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  She has had arthroscopic surgery of both knees.  Several D&Cs  after miscarriages in her early 37s, pacemaker implanted in 09-Apr-1997.  She had a  hematoma after a heart cath and had to have surgery for that.  She has also  had a mediastinoscope which was negative.  Past Pulmonary History:  Pulmonary History: #DYSPNEA eval > Hx: 30 pack ex smoker.Dyspnea since 2007-04-10. Triggered by bending over, class 2-3 exertion, needs to take deep breath, random features as well. also worse by pollen, dust. Associated cough and sinus drainage present.  > PFTs 06/23/2009: Mixed obstn- restriction on spiro, TLC 77% c/w mild restriction, low dlco 57% > Nuclear medicine stress test - 08/01/2009 - normal > Rt Heart cath 08/28/2009 - PA 70/26 (mean 44), PCWP 27. V wave 50- all pulm htn due to Mitral regurg > Stress ECHO 09/13/2009 - mitral regurg did not worsen with exercise perDr. Patty Sermons > [facility] [place] desat test May 10, 2010- destaurated with exertion  at 360 feet > PFT 05/23/2010 - Gold stage 2d COPD- Fev1 1.5L/75%, FVC 2.1L/77%, Ratop 69,  TLC 96%, DLCO 9.5/50%  #COUGH > hx obtained in August 2010 > sinus issues + > ACE inhibitor and non specific Beta blocker intake + > Resolved 05/23/2010 after stoppinboth  Family History: Reviewed history from 05/31/2009 and no changes required. Mother-lymphoma Sister-breast CA  Family history is positive for heart disease, hypertension, diabetes,  cancer, stroke, arthritis, and bleeding disorder.  Social History: Reviewed history from 04/11/2010 and no changes required. Patient states former smoker.  Started smoking in late 04-14-23, quit in 2006/04/13 average 1 pack a day.  widowed 04-13-10 Has 2 children Retired She quit smoking   several years ago, formerly worked as a Lawyer.  No   significant alcohol usage  update 06/16/2009: Husband has lung cancer mets to brain now update 2010/05/14 husband died 2010/04/13. 5 grandkis. Living alone now. Does not want to move in with daughters. Packs/Day:  0.5  Review of Systems       The patient complains of shortness of breath with activity, shortness of breath at rest, irregular heartbeats, acid heartburn, indigestion, loss of appetite, weight change, difficulty swallowing, tooth/dental problems, headaches, nasal congestion/difficulty breathing through nose, anxiety, depression, hand/feet swelling, and joint stiffness or pain.  The patient denies productive cough, non-productive cough, coughing up blood, chest pain, abdominal pain, sore throat, sneezing, itching, ear ache, rash, change in color of mucus, and fever.    Vital Signs:  Patient profile:   75 year old female Height:      64 inches Weight:      147 pounds BMI:     25.32 O2 Sat:      92 % on Room air Temp:     97.0 degrees F oral Pulse rate:   72 / minute BP sitting:   138 / 70  (left arm) Cuff size:   regular  Vitals Entered By: Zackery Barefoot CMA (May 23, 2010 2:06 PM)  O2 Flow:  Room air CC: Pt here for follow up with PFT. Pt states is here for right knee replacement surgical clearance Comments Medications reviewed with patient Verified contact number and pharmacy with patient Zackery Barefoot CMA  May 23, 2010 2:07 PM    Physical Exam  General:  well developed, well nourished, in no acute distress Head:  normocephalic and atraumatic Eyes:  PERRLA/EOM intact; conjunctiva and sclera clear Ears:  TMs intact and clear with normal canals Nose:  no deformity, discharge, inflammation, or lesions Mouth:  no deformity or lesions Neck:  no masses, thyromegaly, or abnormal cervical nodes Chest Wall:  no deformities noted Lungs:  clear bilaterally to auscultation and percussion Heart:  regular rate and rhythm, S1, S2 without murmurs, rubs, gallops, or clicks Abdomen:  bowel sounds positive; abdomen soft and non-tender without masses, or organomegaly Msk:  no  deformity or scoliosis noted with normal posture but gait is antalgic to due to knee pain Pulses:  pulses normal Extremities:  no clubbing, cyanosis, edema, or deformity noted Neurologic:  CN II-XII grossly intact with normal reflexes, coordination, muscle strength and tone Skin:  intact without lesions or rashes Cervical Nodes:  no significant adenopathy Axillary Nodes:  no significant adenopathy Psych:  depressed affect, anxious, and poor historian.   crying +++   MISC. Report  Procedure date:  05/23/2010  Findings:      PFT 05/23/2010 - Gold stage 2d COPD- Fev1 1.5L/75%, FVC 2.1L/77%, Ratop 69,  TLC 96%, DLCO 9.5/50%  Impression & Recommendations:  Problem # 1:  SHORTNESS OF BREATH (ICD-786.05) Assessment Unchanged  PFTs show Gold stage 2 COPD. Dysponea is due to this (quit smoking 2007), deconditoning and pulmonary hypertension due to severe MR  plan start spiriva after knee surgery, will get her to rehab  Orders: Est. Patient Level IV (04540)  Problem # 2:  COUGH (ICD-786.2) Assessment: Improved  resolved after stopping ace inhbiitor and changng atenolol to bisoprolol  plan monitor  Orders: Est. Patient Level IV (98119)  Problem # 3:  CHRONIC OBSTRUCTIVE PULMONARY DISEASE, MODERATE (ICD-496) Assessment: New Gold stage 2 COPD with exertional desaturatiin at 360 feet  plan continue o2 with exertion start spiriva (she was very reluctant to start it citing adverse effects on TV. We discussed side effect profoile. She has reluctantly agreed. have given her 2 samples and taiught technique) rov 3 months at fu check a1at after knee surgery, will get her to pulm rehab  Problem # 4:  PRE-OPERATIVE RESPIRATORY EXAMINATION (ICD-V72.82) Assessment: Unchanged  Mild-Molderate risk only for pulmonary complications following knee surgery. Wil fax note to ortho. She should get bronchodilatros in postop periods, dvt prophyliaxis, aggressive incentive spirometry. If needed,  pulmonary consult in post op period  Orders: Est. Patient Level IV (14782)  Problem # 5:  HYPERTENSION (ICD-401.9) Assessment: Unchanged Continue below. I d/w Dr. Patty Sermons on phone. He will review her meds. Stay off non specific betablocker and ACE inhbitor due to copd and cough Her updated medication list for this problem includes:    Furosemide 40 Mg Tabs (Furosemide) .[name]... Take 1 tablet by mouth once a day as needed    Micardis Hct 80-25 Mg Tabs (Telmisartan-hctz) .[name]... Take 1 tablet by mouth once a day    Bisoprolol Fumarate 10 Mg Tabs (Bisoprolol fumarate) .[name]... 1 by mouth daily  Problem # 6:  DEPRESSIVE DISORDER NOT ELSEWHERE CLASSIFIED (ICD-311) Assessment: Unchanged still dperessed. Seeing a psyuch pa  Medications Added to Medication List This Visit: 1)  Furosemide 40 Mg Tabs (Furosemide) .... Take 1 tablet by mouth once a day as needed 2)  Spiriva Handihaler 18 Mcg Caps (Tiotropium bromide monohydrate) .... One puffs in handihaler daily  Patient Instructions: 1)  please bring your med calendar when you visit any doctor 2)  You have mild COPD 3)  Start spiriva 1 puff daily 4)  Take 2 sample and learn technique from my assistant 5)  Talk to Dr. Patty Sermons about a replacement for captopril 6)   > I would recommend a class of medicine called ARB 7)  Talk to Dr. Patty Sermons about faster heart rate 8)   > you might need dose escalation of bisoprolol 9)  ROV 3 months Prescriptions: SPIRIVA HANDIHALER 18 MCG  CAPS (TIOTROPIUM BROMIDE MONOHYDRATE) one puffs in handihaler daily  #1 x 6   Entered and Authorized by:   Kalman Shan MD   Signed by:   Kalman Shan MD on 05/23/2010   Method used:   Electronically to        [facility]  [address] [phone]* (retail)       [address]       [place], [place]  [address]       Ph: [phone]       Fax: [phone]   RxID:   [id number]      Appended Document: 6-8 weeks/apc     Allergies: 1)  !  Pcn 2)  !  Aspirin 3)  ! * Otc Pain Meds (Aleve)  Past Pulmonary History:  Pulmonary History: #DYSPNEA eval > Hx: 30 pack ex smoker.Dyspnea since 2008. Triggered by bending over, class 2-3 exertion, needs to take deep breath, random features as well. also worse by pollen, dust. Associated cough and sinus drainage present.  > PFTs 06/23/2009: Mixed obstn- restriction on spiro, TLC 77% c/w mild restriction, low dlco 57% > Nuclear medicine stress test - 08/01/2009 - normal > Rt Heart cath 08/28/2009 - PA 70/26 (mean 44), PCWP 27. V wave 50- all pulm htn due to Mitral regurg > Stress ECHO 09/13/2009 - mitral regurg did not worsen with exercise perDr. Patty Sermons > Walkng desat test June 2011 - destaurated with exertion at 360 feet > PFT 05/23/2010 - Gold stage 2d COPD- Fev1 1.5L/75%, FVC 2.1L/77%, Ratop 69,  TLC 96%, DLCO 9.5/50%  #COUGH > hx obtained in August 2010 > sinus issues + > ACE inhibitor and non specific Beta blocker intake + > Resolved 05/23/2010 after stoppinboth   Other Orders: Prescription Created Electronically [phone]) HFA Instruction [id number])

## 2010-12-11 NOTE — Letter (Signed)
Summary: [facility]   Imported By: Sherian Rein 10/02/2010 12:01:17  _____________________________________________________________________  External Attachment:    Type:   Image     Comment:   External Document

## 2010-12-11 NOTE — Progress Notes (Signed)
Summary: change to med list  Phone Note From Other Clinic   Caller: melinda w/ dr Patty Sermons Call For: [name] [name] Summary of Call: melinda (for dr Patty Sermons) called to inform tp (as requested) about pt's meds. they have d/c'd the ATENOLOL but added BISOPROLOL 10mg - take 1 daily. melinda # [id number] Initial call taken by: Tivis Ringer, CNA,  May 10, 2010 9:47 AM  Follow-up for Phone Call        Will forward msg to TP so she is aware. Medications list was updated.Michel Bickers [facility]  May 10, 2010 9:54 AM  Additional Follow-up for Phone Call Additional follow up Details #1::        thank you that is great.  follow up as scheduled.   Additional Follow-up by: [name] Parrett NP,  May 10, 2010 9:57 AM    New/Updated Medications: BISOPROLOL FUMARATE 10 MG TABS (BISOPROLOL FUMARATE) 1 by mouth daily

## 2010-12-13 NOTE — Assessment & Plan Note (Signed)
Summary: rov 6 wks ///kp   Visit Type:  Follow-up Copy to:  Dr. Patty Sermons Primary Provider/Referring Provider:  Dr. Lupe Carney  PMD, Dr. Patty Sermons - Cards, Dr Ranee Gosselin [facility], Psych - [place] called Donnie Aho  CC:  Pt here for sob follow-up. Pt states she has good days and bad. Pt states she has trouble breahting in the shower when it gets steamy. Pt is not taking spiriva because it causes her to feel "jittery." .  History of Present Illness:  75 year old female ex-30pack smoker( quit 2007) with dyspnea y due to mitral reugurg with associated pulmonary hypertension (Rt heart Cath 08/28/2009). Also, Gold stage 2 COPD (Pfts July 2011 - Fev1 1.%L/75%, DLCO 9.5/50%) with exertional hyoxoemia  OV June 2011: I am sseeing [place] after many months. Last visit was 09/30/09. Her husband passed away in interim 1 month ago and she has been very depressed. Crying a lot. Children live elsehwere in state and she is unwilling to live with them. Feeling very lonely. Main pulmonary issues is that she wants pulmonary clearnace before knee surgery by Dr. Darrelyn Hillock. She is being bothered by chronic OA knee pain and worse last 6 months. IN terms of dyspnea this persists. Since husband's death dyspnea is worse esp with crying, anxious spells. There is associated wheeze but not cough, hemoptysis, chest pain, edema orthopnea, paroxysmal noctural dyspnea. Of note, she conntinues to be on ACE inhibitor and non specific beta blocker  REC: STOP ACE INHBITOR AND LISINOPRIL AND THEN MED CALENDAR AND DO PFTS .  ov 05/23/2010: fOLLOWUP DYSPNEA and COUGH. Since last visit has made changes to meds. She is off ace inibitor and atenolol.She is on micardis and bisoprolol now. STill no improvement in class  2 dyspnea byt cough is resolved.. This is chrnic and stable. Gets dyspneic walking to mailbox. She is more bothered by her knee and anxious for me to give her clearance for knee surgery.  September 26, 2010--Presents for an acute office visit. Complains of increased SOB x10days. Was seen at Dr. Jenness Corner office yesterday for same problem. Labs were done. REcords were obtained and reviewed. Her bisoprolol was decreased  and lasix was increased to every other day . Labs -cbc, cmet were essentially unrevealing, INR was 3.1 BNP was 265 . According to records she has O2 at home . Denies chest pain, dyspnea, orthopnea, hemoptysis, fever, n/v/d, edema, headache. Wears out easily and very tired alot.         October 08, 2010 Followup Multifactorial dyspnea (COPD Gold stage 2 and Pulm htn due to Moderate Mitral regurg). Have not seen her since July 2011. She has had her knee surgery (Rt TKR). This has helped rt knee pain but not increased mobility. Still with class 2 dyspnea. Desaturated to 87% with walking 2nd lap. She saw Dr. Patty Sermons 09/25/2010 who feels pulmonary input needed to optimize dyspnea. Per patent surigical intervention for Mitral regurg not discussed. BNP was in 200s but otherwise labs was normal. CXR 09/26/2010 was clear.  She denies cough, fever, chest pains. Of note, she is NOT taking her spiriva which I advised in July 2011. This is because she saw TV ads about spiriva and got 'scared' about side effects which she cannot recollect. REC TRIAL OF [facility]   November 19, 2010: Followup dyspnea/copd. SHe tried spiriva a few times after last visit but it made her jittery and so she dc'ed it. No change in dysnea. No new symptoms. Compliant  with other medicines. Also, I hae not heard from Dr Connye Burkitt if she is a mitral valve repair candidate or not or if she can attend pulmonary rehab. I spoke to him now  he is ok with her attending pulm rehab and he does not think MIrtral regurg is svere enought to warrant repair now    Preventive Screening-Counseling & Management  Alcohol-Tobacco     Smoking Status: quit > 6 months     Packs/Day: 0.5     Year Started: 1953     Year Quit: 2005     Pack  years: 25  Current Medications (verified): 1)  Clonazepam 0.5 Mg Tabs (Clonazepam) .... 1/2 By Mouth As Needed 2)  Klor-Con M20 20 Meq Cr-Tabs (Potassium Chloride Crys Cr) .... Take As Directed W/ Lasix 3)  Calcium Carbonate 600 Mg Tabs (Calcium Carbonate) .... Take 1 Tablet By Mouth Once A Day 4)  Magnesium 200 Mg Tabs (Magnesium) .... Take 1 Tablet By Mouth Once A Day 5)  Gabapentin 300 Mg Caps (Gabapentin) .... One Tablet in The Am, 2 At Lunch, 2 At [employer] and 2 At Bedtime 6)  Warfarin Sodium 5 Mg Tabs (Warfarin Sodium) .... As Directed 7)  Furosemide 40 Mg Tabs (Furosemide) .... Take 1 Tablet By Mouth Once A Day As Needed 8)  Micardis 40 Mg Tabs (Telmisartan) .... Take 1 Tablet By Mouth Once A Day 9)  Cvs Vitamin D 2000 Unit Tabs (Cholecalciferol) .... Take 1 Tablet By Mouth Once A Day 10)  Bisoprolol Fumarate 10 Mg Tabs (Bisoprolol Fumarate) .... Take 1 Tab By Mouth At Bedtime 11)  Hydrocodone-Acetaminophen 10-325 Mg Tabs (Hydrocodone-Acetaminophen) .... One Tablet As Needed For Pain Every 4-6 Hours 12)  Simvastatin 10 Mg Tabs (Simvastatin) .... Take 1 Tablet By Mouth Once A Day 13)  Cyanocobalamin 2500 Mcg Subl (Cyanocobalamin) .... Once Daily 14)  Vitamin C 500 Mg Tabs (Ascorbic Acid) .... Take 1 Tablet By Mouth Once A Day 15)  Tums 500 Mg Chew (Calcium Carbonate Antacid) .... Per Bottle 16)  Zoloft 100 Mg Tabs (Sertraline Hcl) .... Take 1 Tablet By Mouth Once A Day 17)  Hydrochlorothiazide 25 Mg Tabs (Hydrochlorothiazide) .... Take 1 Tablet By Mouth Once A Day 18)  Diltiazem Hcl 120 Mg Tabs (Diltiazem Hcl) .... Take 1 Tablet By Mouth Once A Day 19)  Pepcid 20 Mg Tabs (Famotidine) .... Take 1 Tab By Mouth At Bedtime 20)  Alprazolam 0.25 Mg Tabs (Alprazolam) .... Take 1 Tablet By Mouth Once A Day 21)  Triamcinolone Acetonide 0.1 % Crea (Triamcinolone Acetonide) .... Apply Two Times A Day As Directed By Dr. Bufford Buttner  Allergies (verified): 1)  ! Pcn 2)  ! Aspirin 3)  ! * Otc  Pain Meds (Aleve)  Past History:  Past medical, surgical, family and social histories (including risk factors) reviewed, and no changes noted (except as noted below).  Past Medical History: Reviewed history from 10/08/2010 and no changes required. #COPD - quit smoking 2007  - Gold Stag2 - PFT July 2011: FEv1 1.5L/75%, RAtip 69, DLCO 9.5/50%  - desaturation to 87% after 185 feet x 2 #ACE INHIBITOR COUGH  - June 2011. Resolved #Chronic sinus drainage  #Hyperlipidemia #Hypertension #Chronic Atrial Fib, Hx of Bradycardia and Syncope  -chronic coumadin, failed cardioversion by Bracbill 1998  -functional pacemaker by Dr Graciela Husbands 1998  #Hypertrophic Cardiomyopathy with PULM HTN  -s/p pacer by Dr Graciela Husbands  normal cath per hx 1995  -ECHO9/23/2009 Dr Patty Sermons office - asymetric septal hypertrophy, mild  AS, mild MR, Moderate PAH 74  - Rt heart cath 08/28/2009 - RAP 10, PAP mean 36, PCWP 19 with V waves 35, PVR 4.1, CI 1.9   #TIA  #Admission CHF/"AE-COPD"  04/26/2008 - 04/29/2008   BNP admit450 ->  112 at dc; received lasix and improved  -also Rx as mild AE-COPD  # severe claudication angiogram in 2002-04-12, that showed occlusion in the right of   the superficial femoral arteries with 3-vessel runoff.  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  # history of vertigo and a  history of Bell palsy.  #severe chronic pain syndrome with chronic low  back pain.  # prior psychiatric history with depression as well   as severe anxiety.   #Labs 05/23/2009: Creat 0.9mg %, Lab 4.8gm%, hgb 14.5gm%, plat 231k. TSH 1.69  Past Surgical History: Reviewed history from 05/31/2009 and no changes required. Back Surgery-2005 Appendectomy Total Abdominal Hysterectomy Pacemaker: 04/12/97, battery replaced Apr 12, 2005  Appendectomy, tonsillectomy, partial  hysterectomy, surgery on the right foot, and left knee surgery.   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  She has had arthroscopic surgery of both knees.  Several D&Cs  after miscarriages in her early 51s, pacemaker implanted in 1997/04/12.  She had a  hematoma after a heart cath and had to have surgery for that.  She has also  had a mediastinoscope which was negative.  Past Pulmonary History:  Pulmonary History: #DYSPNEA eval > Hx: 30 pack ex smoker.Dyspnea since Apr 13, 2007. Triggered by bending over, class 2-3 exertion, needs to take deep breath, random features as well. also worse by pollen, dust. Associated cough and sinus drainage present.  > PFTs 06/23/2009: Mixed obstn- restriction on spiro, TLC 77% c/w mild restriction, low dlco 57% > Nuclear medicine stress test - 08/01/2009 - normal > Rt Heart cath 08/28/2009 - PA 70/26 (mean 44), PCWP 27. V wave 50- all pulm htn due to Mitral regurg > Stress ECHO 09/13/2009 - mitral regurg did not worsen with exercise perDr. Patty Sermons > Walkng desat test May 13, 2010- destaurated with exertion at 360 feet > PFT 05/23/2010 - Gold stage 2d COPD- Fev1 1.5L/75%, FVC 2.1L/77%, Ratop 69,  TLC 96%, DLCO 9.5/50%  #COUGH > hx obtained in August 2010 > sinus issues + > ACE inhibitor and non specific Beta blocker intake + > Resolved 05/23/2010 after stoppinboth  Family History: Reviewed history from 05/31/2009 and no changes required. Mother-lymphoma Sister-breast CA  Family history is positive for heart disease, hypertension, diabetes,  cancer, stroke, arthritis, and bleeding disorder.  Social History: Reviewed history from 09/26/2010 and no changes required. Patient states former smoker.  Started smoking in late 2023-04-13, quit in 04-12-06 average 1 pack a day.  widowed April 12, 2010 Has 2 children Retired She quit smoking   several years ago, formerly worked as a Lawyer.  No   significant alcohol usage declines flu shot 09-26-10  update 06/16/2009: Husband has lung cancer mets to brain now update July 03, 2011husband died April 12, 2010. 5  grandkis. Living alone now. Does not want to move in with daughters.   Review of Systems       The patient complains of shortness of breath with activity.  The patient denies shortness of breath at rest, productive cough, non-productive cough, coughing up blood, chest pain, irregular heartbeats, acid heartburn, indigestion, loss of appetite, weight change, abdominal pain, difficulty swallowing, sore throat,  tooth/dental problems, headaches, nasal congestion/difficulty breathing through nose, sneezing, itching, ear ache, anxiety, depression, hand/feet swelling, joint stiffness or pain, rash, change in color of mucus, and fever.    Vital Signs:  Patient profile:   75 year old female Height:      64 inches Weight:      151 pounds BMI:     26.01 O2 Sat:      92 % on Room air Temp:     97.4 degrees F oral Pulse rate:   68 / minute BP sitting:   120 / 66  (right arm) Cuff size:   regular  Vitals Entered By: Carron Curie CMA (November 19, 2010 2:54 PM)  O2 Flow:  Room air CC: Pt here for sob follow-up. Pt states she has good days and bad. Pt states she has trouble breahting in the shower when it gets steamy. Pt is not taking spiriva because it causes her to feel "jittery."    Physical Exam  General:  well developed, well nourished, in no acute distress Head:  normocephalic and atraumatic Eyes:  PERRLA/EOM intact; conjunctiva and sclera clear Ears:  TMs intact and clear with normal canals Nose:  no deformity, discharge, inflammation, or lesions Mouth:  no deformity or lesions Neck:  no masses, thyromegaly, or abnormal cervical nodes Chest Wall:  no deformities noted Lungs:  clear bilaterally to auscultation and percussion Heart:  regular rhythm, normal rate, and Grade  3/6 SEM loudest at apex -->axilla.   Abdomen:  bowel sounds positive; abdomen soft and non-tender without masses, or organomegaly Msk:  no deformity or scoliosis noted with normal posture but gait is antalgic to due to  knee pain Pulses:  pulses normal Extremities:  no clubbing, cyanosis, edema, or deformity noted Neurologic:  CN II-XII grossly intact with normal reflexes, coordination, muscle strength and tone Skin:  intact without lesions or rashes Cervical Nodes:  no significant adenopathy Axillary Nodes:  no significant adenopathy Psych:  depressed affect.  but less than before   Impression & Recommendations:  Problem # 1:  CHRONIC OBSTRUCTIVE PULMONARY DISEASE, MODERATE (ICD-496) Assessment Unchanged  stil with class 2 dyspnea. The only thing as pulmonary I could fix is optimizing Rx for COPD. She is also nocompliant iwth o2. She is intoleratnt to spiriva (jitteriness). AFter much discussion she is willing to try symbicort. I have also advisd pulm rehab (dr Patty Sermons is ok with her attending it). We will see how these work for her.   Medications Added to Medication List This Visit: 1)  Symbicort 80-4.5 Mcg/act Aero (Budesonide-formoterol fumarate) .... Two puffs twice daily  Other Orders: Prescription Created Electronically [phone]) Est. Patient Level II (62130) Rehabilitation Referral (Rehab)  Patient Instructions: 1)  please try 1 inhaler of symbicort 2 puff two times a day 2)   - take 2 samples 3)   - learn technique 4)  I will talk to Dr. Patty Sermons now and see if okay to attend pulmonary rehab 5)  return in 2 months to report progress Prescriptions: SYMBICORT 80-4.5 MCG/ACT  AERO (BUDESONIDE-FORMOTEROL FUMARATE) Two puffs twice daily  #1 x 6   Entered and Authorized by:   Kalman Shan MD   Signed by:   Kalman Shan MD on 11/19/2010   Method used:   Historical   RxID:   [id number]    Immunization History:  Influenza Immunization History:    Influenza:  historical (10/15/2010)  Pneumovax Immunization History:    Pneumovax:  historical (10/15/2010)

## 2010-12-13 NOTE — Progress Notes (Signed)
Summary: [name] [name]  Phone Note Call from Patient Call back at [facility] Phone [phone]   Caller: Patient Summary of Call: PT HAVING SOB AND HEART BEATING OUT RHYTHM Initial call taken by: Judie Grieve,  November 30, 2010 2:12 PM  Follow-up for Phone Call        spoke with pt, she c/o since the beginning of the week her heart racing with little exerction and gradual increase in SOB. she reports with bending over or with little movement her heart races away and that causes her to become SOB. she reports it occuring more freq and the SOB getting worse. she had called dr brackbill's office, was told he was not there and to call us. discussed with Haig Prophet in the device clinic, pt instructed to come to the office for check Deliah Goody, RN  November 30, 2010 2:37 PM   Additional Follow-up for Phone Call Additional follow up Details #1::        pt here, device interigated. she is in atrial fib all the time but her rate is controlled. she states the pulmonary md stopped some of her meds and she cont to have SOB. she also states she notices her irregular heart beat more. reassurance given top the pt. appt made for pt to see dr Patty Sermons tuesday next week. follow up appt with dr Graciela Husbands in march Deliah Goody, RN  November 30, 2010 4:21 PM

## 2010-12-13 NOTE — Cardiovascular Report (Signed)
Summary: Office Visit   Office Visit   Imported By: [name] 11/20/2010 16:20:47  _____________________________________________________________________  External Attachment:    Type:   Image     Comment:   External Document

## 2010-12-13 NOTE — Procedures (Signed)
Summary: pacer check/medtronic   Current Medications (verified): 1)  Clonazepam 0.5 Mg Tabs (Clonazepam) .... 1/2 By Mouth As Needed 2)  Klor-Con M20 20 Meq Cr-Tabs (Potassium Chloride Crys Cr) .... Take As Directed W/ Lasix 3)  Calcium Carbonate 600 Mg Tabs (Calcium Carbonate) .... Take 1 Tablet By Mouth Once A Day 4)  Magnesium 200 Mg Tabs (Magnesium) .... Take 1 Tablet By Mouth Once A Day 5)  Gabapentin 300 Mg Caps (Gabapentin) .... One Tablet in The Am, 2 At Lunch, 2 At [employer] and 2 At Bedtime 6)  Warfarin Sodium 5 Mg Tabs (Warfarin Sodium) .... As Directed 7)  Furosemide 40 Mg Tabs (Furosemide) .... Take 1 Tablet By Mouth Once A Day As Needed 8)  Micardis 40 Mg Tabs (Telmisartan) .... Take 1 Tablet By Mouth Once A Day 9)  Cvs Vitamin D 2000 Unit Tabs (Cholecalciferol) .... Take 1 Tablet By Mouth Once A Day 10)  Bisoprolol Fumarate 10 Mg Tabs (Bisoprolol Fumarate) .... Take 1 Tab By Mouth At Bedtime 11)  Hydrocodone-Acetaminophen 10-325 Mg Tabs (Hydrocodone-Acetaminophen) .... One Tablet As Needed For Pain Every 4-6 Hours 12)  Simvastatin 10 Mg Tabs (Simvastatin) .... Take 1 Tablet By Mouth Once A Day 13)  Cyanocobalamin 2500 Mcg Subl (Cyanocobalamin) .... Once Daily 14)  Vitamin C 500 Mg Tabs (Ascorbic Acid) .... Take 1 Tablet By Mouth Once A Day 15)  Tums 500 Mg Chew (Calcium Carbonate Antacid) .... Per Bottle 16)  Zoloft 100 Mg Tabs (Sertraline Hcl) .... Take 1 Tablet By Mouth Once A Day 17)  Hydrochlorothiazide 25 Mg Tabs (Hydrochlorothiazide) .... Take 1 Tablet By Mouth Once A Day 18)  Diltiazem Hcl 120 Mg Tabs (Diltiazem Hcl) .... Take 1 Tablet By Mouth Once A Day 19)  Pepcid 20 Mg Tabs (Famotidine) .... Take 1 Tab By Mouth At Bedtime 20)  Alprazolam 0.25 Mg Tabs (Alprazolam) .... Take 1 Tablet By Mouth Once A Day 21)  Triamcinolone Acetonide 0.1 % Crea (Triamcinolone Acetonide) .... Apply Two Times A Day As Directed By Dr. Bufford Buttner  Allergies (verified): 1)  ! Pcn 2)   ! Aspirin 3)  ! * Otc Pain Meds (Aleve)  PPM Specifications Following MD:  Sherryl Manges, MD     Referring MD:  Villa Herb PPM Vendor:  Medtronic     PPM Model Number:  [id number]     PPM Serial Number:  [id number] PPM DOI:  01/02/2005     PPM Implanting MD:  Rolla Plate  Lead 1    Location: RA     DOI: 03/31/1997     Model #: [id number]     Serial #: [id number]     Status: active Lead 2    Location: RV     DOI: 03/31/1997     Model #: [id number]     Serial #: [id number]     Status: active  Magnet Response Rate:  BOL 85 ERI 65  Indications:  Sick sinus syndrome   PPM Follow Up Remote Check?  No Battery Voltage:  2.90 V     Pacer Dependent:  No       PPM Device Measurements Atrium  Amplitude: 1.5 mV, Impedance: 376 ohms,  Right Ventricle  Amplitude: 19.9 mV, Impedance: 384 ohms, Threshold: 1.0 V at 0.5 msec  Episodes Percent Mode Switch:  100%     Coumadin:  Yes Ventricular Pacing:  61.2%  Parameters Mode:  VVIR     Lower Rate Limit:  60     Upper  Rate Limit:  110 Next Cardiology Appt Due:  01/10/2011 Tech Comments:  RV reprogrammed 2.5@0 .4.  [name] [name] is c/o SOB with minimal exertion.  The majority of her ventricular rates are < 100bpm.  She also states" Her anxiety level has been high since her husband passed away last spring."  I will fax this session to Dr. Patty Sermons.  We will have her return in 3 months with Dr. Graciela Husbands and resume her [facility] transmissions at that time. Altha Harm, LPN  November 08, 2010 3:04 PM

## 2010-12-13 NOTE — Procedures (Signed)
Summary: Cardiology Device Clinic   Current Medications (verified): 1)  Clonazepam 0.5 Mg Tabs (Clonazepam) .... 1/2 By Mouth As Needed 2)  Klor-Con M20 20 Meq Cr-Tabs (Potassium Chloride Crys Cr) .... Take As Directed W/ Lasix 3)  Calcium Carbonate 600 Mg Tabs (Calcium Carbonate) .... Take 1 Tablet By Mouth Once A Day 4)  Magnesium 200 Mg Tabs (Magnesium) .... Take 1 Tablet By Mouth Once A Day 5)  Gabapentin 300 Mg Caps (Gabapentin) .... One Tablet in The Am, 2 At Lunch, 2 At [employer] and 2 At Bedtime 6)  Warfarin Sodium 5 Mg Tabs (Warfarin Sodium) .... As Directed 7)  Furosemide 40 Mg Tabs (Furosemide) .... Take 1 Tablet By Mouth Once A Day As Needed 8)  Micardis 40 Mg Tabs (Telmisartan) .... Take 1 Tablet By Mouth Once A Day 9)  Cvs Vitamin D 2000 Unit Tabs (Cholecalciferol) .... Take 1 Tablet By Mouth Once A Day 10)  Bisoprolol Fumarate 10 Mg Tabs (Bisoprolol Fumarate) .... Take 1 Tab By Mouth At Bedtime 11)  Hydrocodone-Acetaminophen 10-325 Mg Tabs (Hydrocodone-Acetaminophen) .... One Tablet As Needed For Pain Every 4-6 Hours 12)  Simvastatin 10 Mg Tabs (Simvastatin) .... Take 1 Tablet By Mouth Once A Day 13)  Cyanocobalamin 2500 Mcg Subl (Cyanocobalamin) .... Once Daily 14)  Vitamin C 500 Mg Tabs (Ascorbic Acid) .... Take 1 Tablet By Mouth Once A Day 15)  Tums 500 Mg Chew (Calcium Carbonate Antacid) .... Per Bottle 16)  Zoloft 100 Mg Tabs (Sertraline Hcl) .... Take 1 Tablet By Mouth Once A Day 17)  Hydrochlorothiazide 25 Mg Tabs (Hydrochlorothiazide) .... Take 1 Tablet By Mouth Once A Day 18)  Diltiazem Hcl 120 Mg Tabs (Diltiazem Hcl) .... Take 1 Tablet By Mouth Once A Day 19)  Pepcid 20 Mg Tabs (Famotidine) .... Take 1 Tab By Mouth At Bedtime 20)  Alprazolam 0.25 Mg Tabs (Alprazolam) .... Take 1 Tablet By Mouth Once A Day 21)  Triamcinolone Acetonide 0.1 % Crea (Triamcinolone Acetonide) .... Apply Two Times A Day As Directed By Dr. Bufford Buttner 22)  Symbicort 80-4.5 Mcg/act  Aero  (Budesonide-Formoterol Fumarate) .... Two Puffs Twice Daily  Allergies (verified): 1)  ! Pcn 2)  ! Aspirin 3)  ! * Otc Pain Meds (Aleve)  PPM Specifications Following MD:  Sherryl Manges, MD     Referring MD:  Villa Herb PPM Vendor:  Medtronic     PPM Model Number:  [id number]     PPM Serial Number:  [id number] PPM DOI:  01/02/2005     PPM Implanting MD:  Rolla Plate  Lead 1    Location: RA     DOI: 03/31/1997     Model #: [id number]     Serial #: [id number]     Status: active Lead 2    Location: RV     DOI: 03/31/1997     Model #: [id number]     Serial #: [id number]     Status: active  Magnet Response Rate:  BOL 85 ERI 65  Indications:  Sick sinus syndrome   PPM Follow Up Battery Voltage:  2.90 V     Pacer Dependent:  No       PPM Device Measurements Atrium  Amplitude: 0.7 mV, Impedance: 384 ohms,  Right Ventricle  Amplitude: 19.9 mV, Impedance: 384 ohms, Threshold: 1.5 V at 0.5 msec  Episodes MS Episodes:  1     Percent Mode Switch:  100%     Coumadin:  Yes Ventricular High Rate:  0     Ventricular Pacing:  71%  Parameters Mode:  VVIR     Lower Rate Limit:  60     Upper Rate Limit:  110 Next Cardiology Appt Due:  01/16/2011 Tech Comments:  PT IN AF 100% OF TIME. +WARFARIN.  NORMAL DEVICE FUNCTION.  CHANGED RV OUTPUT FROM 2.5 TO 3.0 V. ROV 01-16-11 @ 1600 W/SK. Vella Kohler  December 05, 2010 3:49 PM

## 2010-12-19 NOTE — Cardiovascular Report (Signed)
Summary: Office Visit   Office Visit   Imported By: [name] 12/13/2010 10:32:58  _____________________________________________________________________  External Attachment:    Type:   Image     Comment:   External Document

## 2011-01-01 ENCOUNTER — Encounter (INDEPENDENT_AMBULATORY_CARE_PROVIDER_SITE_OTHER): Payer: Medicare Other

## 2011-01-01 DIAGNOSIS — I4891 Unspecified atrial fibrillation: Secondary | ICD-10-CM

## 2011-01-01 DIAGNOSIS — Z7901 Long term (current) use of anticoagulants: Secondary | ICD-10-CM

## 2011-01-02 ENCOUNTER — Inpatient Hospital Stay (HOSPITAL_COMMUNITY)
Admission: EM | Admit: 2011-01-02 | Discharge: 2011-01-11 | DRG: 149 | Disposition: A | Discharge status: Rehabilitation Facility | Payer: Medicare Other | Attending: Family Medicine | Admitting: Family Medicine

## 2011-01-02 ENCOUNTER — Emergency Department (HOSPITAL_COMMUNITY): Payer: Medicare Other

## 2011-01-02 DIAGNOSIS — E669 Obesity, unspecified: Secondary | ICD-10-CM | POA: Diagnosis present

## 2011-01-02 DIAGNOSIS — S0100XA Unspecified open wound of scalp, initial encounter: Secondary | ICD-10-CM | POA: Diagnosis present

## 2011-01-02 DIAGNOSIS — I422 Other hypertrophic cardiomyopathy: Secondary | ICD-10-CM | POA: Diagnosis present

## 2011-01-02 DIAGNOSIS — Z8673 Personal history of transient ischemic attack (TIA), and cerebral infarction without residual deficits: Secondary | ICD-10-CM

## 2011-01-02 DIAGNOSIS — F341 Dysthymic disorder: Secondary | ICD-10-CM | POA: Diagnosis present

## 2011-01-02 DIAGNOSIS — A498 Other bacterial infections of unspecified site: Secondary | ICD-10-CM | POA: Diagnosis present

## 2011-01-02 DIAGNOSIS — I2510 Atherosclerotic heart disease of native coronary artery without angina pectoris: Secondary | ICD-10-CM | POA: Diagnosis present

## 2011-01-02 DIAGNOSIS — K219 Gastro-esophageal reflux disease without esophagitis: Secondary | ICD-10-CM | POA: Diagnosis present

## 2011-01-02 DIAGNOSIS — N390 Urinary tract infection, site not specified: Secondary | ICD-10-CM | POA: Diagnosis present

## 2011-01-02 DIAGNOSIS — Z79899 Other long term (current) drug therapy: Secondary | ICD-10-CM

## 2011-01-02 DIAGNOSIS — I509 Heart failure, unspecified: Secondary | ICD-10-CM | POA: Diagnosis present

## 2011-01-02 DIAGNOSIS — Y998 Other external cause status: Secondary | ICD-10-CM

## 2011-01-02 DIAGNOSIS — Z96659 Presence of unspecified artificial knee joint: Secondary | ICD-10-CM

## 2011-01-02 DIAGNOSIS — K589 Irritable bowel syndrome without diarrhea: Secondary | ICD-10-CM | POA: Diagnosis present

## 2011-01-02 DIAGNOSIS — I2789 Other specified pulmonary heart diseases: Secondary | ICD-10-CM | POA: Diagnosis present

## 2011-01-02 DIAGNOSIS — Z880 Allergy status to penicillin: Secondary | ICD-10-CM

## 2011-01-02 DIAGNOSIS — M109 Gout, unspecified: Secondary | ICD-10-CM | POA: Diagnosis present

## 2011-01-02 DIAGNOSIS — W1809XA Striking against other object with subsequent fall, initial encounter: Secondary | ICD-10-CM | POA: Diagnosis present

## 2011-01-02 DIAGNOSIS — E876 Hypokalemia: Secondary | ICD-10-CM | POA: Diagnosis not present

## 2011-01-02 DIAGNOSIS — Y93E1 Activity, personal bathing and showering: Secondary | ICD-10-CM

## 2011-01-02 DIAGNOSIS — I6529 Occlusion and stenosis of unspecified carotid artery: Secondary | ICD-10-CM | POA: Diagnosis present

## 2011-01-02 DIAGNOSIS — Y92009 Unspecified place in unspecified non-institutional (private) residence as the place of occurrence of the external cause: Secondary | ICD-10-CM

## 2011-01-02 DIAGNOSIS — H8110 Benign paroxysmal vertigo, unspecified ear: Principal | ICD-10-CM | POA: Diagnosis present

## 2011-01-02 DIAGNOSIS — Z950 Presence of cardiac pacemaker: Secondary | ICD-10-CM

## 2011-01-02 DIAGNOSIS — I8390 Asymptomatic varicose veins of unspecified lower extremity: Secondary | ICD-10-CM | POA: Diagnosis present

## 2011-01-02 DIAGNOSIS — E785 Hyperlipidemia, unspecified: Secondary | ICD-10-CM | POA: Diagnosis present

## 2011-01-02 DIAGNOSIS — J45909 Unspecified asthma, uncomplicated: Secondary | ICD-10-CM | POA: Diagnosis present

## 2011-01-02 DIAGNOSIS — M129 Arthropathy, unspecified: Secondary | ICD-10-CM | POA: Diagnosis present

## 2011-01-02 DIAGNOSIS — Z7901 Long term (current) use of anticoagulants: Secondary | ICD-10-CM

## 2011-01-02 DIAGNOSIS — I4891 Unspecified atrial fibrillation: Secondary | ICD-10-CM | POA: Diagnosis present

## 2011-01-02 DIAGNOSIS — I951 Orthostatic hypotension: Secondary | ICD-10-CM | POA: Diagnosis not present

## 2011-01-02 LAB — CBC
HCT: 43.9 % (ref 36.0–46.0)
MCH: 32.1 pg (ref 26.0–34.0)
MCHC: 32.8 g/dL (ref 30.0–36.0)
Platelets: 169 10*3/uL (ref 150–400)
WBC: 9.6 10*3/uL (ref 4.0–10.5)

## 2011-01-02 LAB — BASIC METABOLIC PANEL
CO2: 27 mEq/L (ref 19–32)
Calcium: 9.6 mg/dL (ref 8.4–10.5)
Chloride: 98 mEq/L (ref 96–112)
GFR calc non Af Amer: >60 mL/min (ref >60)
Glucose, Bld: 171 mg/dL — ABNORMAL HIGH (ref 70–99)

## 2011-01-02 LAB — URINALYSIS, ROUTINE W REFLEX MICROSCOPIC
Nitrite: POSITIVE — AB
Urine Glucose, Fasting: NEGATIVE mg/dL

## 2011-01-02 LAB — DIFFERENTIAL
Basophils Absolute: 0 10*3/uL (ref 0.0–0.1)
Basophils Relative: 0 % (ref 0–1)
Eosinophils Absolute: 0.2 10*3/uL (ref 0.0–0.7)
Eosinophils Relative: 2 % (ref 0–5)
Monocytes Absolute: 0.6 10*3/uL (ref 0.1–1.0)
Neutro Abs: 7.9 10*3/uL — ABNORMAL HIGH (ref 1.7–7.7)

## 2011-01-02 LAB — CARDIAC PANEL(CRET KIN+CKTOT+MB+TROPI)
CK, MB: 1.6 ng/mL (ref 0.3–4.0)
Total CK: 96 U/L (ref 7–177)
Total CK: 90 U/L (ref 7–177)
Troponin I: 0.03 ng/mL (ref 0.00–0.06)

## 2011-01-02 LAB — POCT CARDIAC MARKERS
CKMB, poc: 1.6 ng/mL (ref 1.0–8.0)
Troponin i, poc: <0.05 ng/mL (ref 0.00–0.09)

## 2011-01-02 LAB — GLUCOSE, CAPILLARY

## 2011-01-02 LAB — URINE MICROSCOPIC-ADD ON

## 2011-01-03 ENCOUNTER — Inpatient Hospital Stay (HOSPITAL_COMMUNITY): Payer: Medicare Other

## 2011-01-03 DIAGNOSIS — I4891 Unspecified atrial fibrillation: Secondary | ICD-10-CM

## 2011-01-03 DIAGNOSIS — I059 Rheumatic mitral valve disease, unspecified: Secondary | ICD-10-CM

## 2011-01-03 DIAGNOSIS — R55 Syncope and collapse: Secondary | ICD-10-CM

## 2011-01-03 LAB — CARDIAC PANEL(CRET KIN+CKTOT+MB+TROPI)
CK, MB: 1.9 ng/mL (ref 0.3–4.0)
Relative Index: INVALID (ref 0.0–2.5)
Total CK: 90 U/L (ref 7–177)

## 2011-01-03 LAB — BASIC METABOLIC PANEL
Chloride: 102 mEq/L (ref 96–112)
GFR calc non Af Amer: >60 mL/min (ref >60)
Glucose, Bld: 177 mg/dL — ABNORMAL HIGH (ref 70–99)
Potassium: 3.5 mEq/L (ref 3.5–5.1)
Sodium: 137 mEq/L (ref 135–145)

## 2011-01-03 LAB — LIPID PANEL
Total CHOL/HDL Ratio: 4.9 RATIO
VLDL: 27 mg/dL (ref 0–40)

## 2011-01-03 LAB — POCT CARDIAC MARKERS
CKMB, poc: 1.4 ng/mL (ref 1.0–8.0)
Troponin i, poc: <0.05 ng/mL (ref 0.00–0.09)

## 2011-01-03 LAB — GLUCOSE, CAPILLARY: Glucose-Capillary: 141 mg/dL — ABNORMAL HIGH (ref 70–99)

## 2011-01-03 LAB — MAGNESIUM: Magnesium: 2 mg/dL (ref 1.5–2.5)

## 2011-01-03 LAB — PROTIME-INR: Prothrombin Time: 24.7 seconds — ABNORMAL HIGH (ref 11.6–15.2)

## 2011-01-03 MED ORDER — IOHEXOL 350 MG/ML SOLN
100.0000 mL | Freq: Once | INTRAVENOUS | Status: AC | PRN
  Administered 2011-01-03: 100 mL via INTRAVENOUS

## 2011-01-04 DIAGNOSIS — R55 Syncope and collapse: Secondary | ICD-10-CM

## 2011-01-04 LAB — GLUCOSE, CAPILLARY
Glucose-Capillary: 154 mg/dL — ABNORMAL HIGH (ref 70–99)
Glucose-Capillary: 133 mg/dL — ABNORMAL HIGH (ref 70–99)
Glucose-Capillary: 99 mg/dL (ref 70–99)
Glucose-Capillary: 153 mg/dL — ABNORMAL HIGH (ref 70–99)

## 2011-01-04 LAB — BASIC METABOLIC PANEL
Chloride: 98 mEq/L (ref 96–112)
GFR calc non Af Amer: 57 mL/min — ABNORMAL LOW (ref >60)
Glucose, Bld: 159 mg/dL — ABNORMAL HIGH (ref 70–99)
Potassium: 3 mEq/L — ABNORMAL LOW (ref 3.5–5.1)
Sodium: 137 mEq/L (ref 135–145)

## 2011-01-04 LAB — CBC
HCT: 38.1 % (ref 36.0–46.0)
Platelets: 164 10*3/uL (ref 150–400)
RBC: 3.92 MIL/uL (ref 3.87–5.11)
RDW: 13.7 % (ref 11.5–15.5)
WBC: 7.6 10*3/uL (ref 4.0–10.5)

## 2011-01-04 LAB — URINE CULTURE
Colony Count: >=100000
Culture  Setup Time: 201202221414

## 2011-01-04 LAB — PROTIME-INR
INR: 2.51 — ABNORMAL HIGH (ref 0.00–1.49)
Prothrombin Time: 27.2 s — ABNORMAL HIGH (ref 11.6–15.2)

## 2011-01-05 LAB — PROTIME-INR
INR: 2.44 — ABNORMAL HIGH (ref 0.00–1.49)
Prothrombin Time: 26.6 seconds — ABNORMAL HIGH (ref 11.6–15.2)

## 2011-01-05 LAB — BASIC METABOLIC PANEL
BUN: 20 mg/dL (ref 6–23)
Chloride: 97 mEq/L (ref 96–112)
GFR calc Af Amer: >60 mL/min (ref >60)
GFR calc non Af Amer: >60 mL/min (ref >60)
Potassium: 3.9 mEq/L (ref 3.5–5.1)

## 2011-01-05 LAB — SEDIMENTATION RATE: Sed Rate: 35 mm/hr — ABNORMAL HIGH (ref 0–22)

## 2011-01-05 LAB — TSH: TSH: 2.387 u[IU]/mL (ref 0.350–4.500)

## 2011-01-05 LAB — CBC
HCT: 38.6 % (ref 36.0–46.0)
Hemoglobin: 12.4 g/dL (ref 12.0–15.0)
MCHC: 32.1 g/dL (ref 30.0–36.0)
RBC: 4 MIL/uL (ref 3.87–5.11)
WBC: 6.6 10*3/uL (ref 4.0–10.5)

## 2011-01-05 LAB — GLUCOSE, CAPILLARY: Glucose-Capillary: 172 mg/dL — ABNORMAL HIGH (ref 70–99)

## 2011-01-06 LAB — GLUCOSE, CAPILLARY
Glucose-Capillary: 168 mg/dL — ABNORMAL HIGH (ref 70–99)
Glucose-Capillary: 137 mg/dL — ABNORMAL HIGH (ref 70–99)

## 2011-01-06 LAB — PROTIME-INR
INR: 2.2 — ABNORMAL HIGH (ref 0.00–1.49)
Prothrombin Time: 24.6 seconds — ABNORMAL HIGH (ref 11.6–15.2)

## 2011-01-07 LAB — CBC
HCT: 39.8 % (ref 36.0–46.0)
Hemoglobin: 13 g/dL (ref 12.0–15.0)
MCH: 31.6 pg (ref 26.0–34.0)
MCHC: 32.7 g/dL (ref 30.0–36.0)
RDW: 13.9 % (ref 11.5–15.5)

## 2011-01-07 LAB — BASIC METABOLIC PANEL
BUN: 20 mg/dL (ref 6–23)
CO2: 31 mEq/L (ref 19–32)
Calcium: 9.6 mg/dL (ref 8.4–10.5)
Creatinine, Ser: 0.99 mg/dL (ref 0.4–1.2)
GFR calc non Af Amer: 55 mL/min — ABNORMAL LOW (ref >60)
Glucose, Bld: 152 mg/dL — ABNORMAL HIGH (ref 70–99)

## 2011-01-07 LAB — GLUCOSE, CAPILLARY
Glucose-Capillary: 96 mg/dL (ref 70–99)
Glucose-Capillary: 227 mg/dL — ABNORMAL HIGH (ref 70–99)

## 2011-01-07 LAB — PROTIME-INR: Prothrombin Time: 20.4 seconds — ABNORMAL HIGH (ref 11.6–15.2)

## 2011-01-08 LAB — GLUCOSE, CAPILLARY
Glucose-Capillary: 179 mg/dL — ABNORMAL HIGH (ref 70–99)
Glucose-Capillary: 161 mg/dL — ABNORMAL HIGH (ref 70–99)

## 2011-01-08 LAB — PROTIME-INR: Prothrombin Time: 19.2 seconds — ABNORMAL HIGH (ref 11.6–15.2)

## 2011-01-09 LAB — PROTIME-INR: INR: 1.66 — ABNORMAL HIGH (ref 0.00–1.49)

## 2011-01-10 DIAGNOSIS — H8110 Benign paroxysmal vertigo, unspecified ear: Secondary | ICD-10-CM

## 2011-01-10 DIAGNOSIS — F0781 Postconcussional syndrome: Secondary | ICD-10-CM

## 2011-01-10 LAB — GLUCOSE, CAPILLARY
Glucose-Capillary: 154 mg/dL — ABNORMAL HIGH (ref 70–99)
Glucose-Capillary: 131 mg/dL — ABNORMAL HIGH (ref 70–99)
Glucose-Capillary: 150 mg/dL — ABNORMAL HIGH (ref 70–99)

## 2011-01-10 LAB — BASIC METABOLIC PANEL
Calcium: 9.8 mg/dL (ref 8.4–10.5)
Chloride: 98 mEq/L (ref 96–112)
Creatinine, Ser: 1.1 mg/dL (ref 0.4–1.2)
GFR calc Af Amer: 59 mL/min — ABNORMAL LOW (ref >60)
Sodium: 135 mEq/L (ref 135–145)

## 2011-01-10 LAB — PROTIME-INR
INR: 1.59 — ABNORMAL HIGH (ref 0.00–1.49)
Prothrombin Time: 19.1 seconds — ABNORMAL HIGH (ref 11.6–15.2)

## 2011-01-11 ENCOUNTER — Inpatient Hospital Stay (HOSPITAL_COMMUNITY)
Admission: RE | Admit: 2011-01-11 | Discharge: 2011-01-21 | DRG: 945 | Disposition: A | Discharge status: Skilled Nursing Facility | Payer: Medicare Other | Source: Other Acute Inpatient Hospital | Attending: Physical Medicine & Rehabilitation | Admitting: Physical Medicine & Rehabilitation

## 2011-01-11 ENCOUNTER — Ambulatory Visit: Payer: Self-pay | Admitting: Cardiology

## 2011-01-11 DIAGNOSIS — F341 Dysthymic disorder: Secondary | ICD-10-CM | POA: Diagnosis present

## 2011-01-11 DIAGNOSIS — M545 Low back pain, unspecified: Secondary | ICD-10-CM | POA: Diagnosis present

## 2011-01-11 DIAGNOSIS — W19XXXA Unspecified fall, initial encounter: Secondary | ICD-10-CM | POA: Diagnosis present

## 2011-01-11 DIAGNOSIS — R5381 Other malaise: Secondary | ICD-10-CM

## 2011-01-11 DIAGNOSIS — H8110 Benign paroxysmal vertigo, unspecified ear: Secondary | ICD-10-CM

## 2011-01-11 DIAGNOSIS — Z96659 Presence of unspecified artificial knee joint: Secondary | ICD-10-CM

## 2011-01-11 DIAGNOSIS — G450 Vertebro-basilar artery syndrome: Secondary | ICD-10-CM | POA: Diagnosis present

## 2011-01-11 DIAGNOSIS — F0781 Postconcussional syndrome: Secondary | ICD-10-CM

## 2011-01-11 DIAGNOSIS — Z950 Presence of cardiac pacemaker: Secondary | ICD-10-CM

## 2011-01-11 DIAGNOSIS — Z87891 Personal history of nicotine dependence: Secondary | ICD-10-CM

## 2011-01-11 DIAGNOSIS — Z5189 Encounter for other specified aftercare: Principal | ICD-10-CM

## 2011-01-11 DIAGNOSIS — S060X0A Concussion without loss of consciousness, initial encounter: Secondary | ICD-10-CM | POA: Diagnosis present

## 2011-01-11 DIAGNOSIS — K219 Gastro-esophageal reflux disease without esophagitis: Secondary | ICD-10-CM | POA: Diagnosis present

## 2011-01-11 DIAGNOSIS — I4891 Unspecified atrial fibrillation: Secondary | ICD-10-CM | POA: Diagnosis present

## 2011-01-11 DIAGNOSIS — J45909 Unspecified asthma, uncomplicated: Secondary | ICD-10-CM | POA: Diagnosis present

## 2011-01-11 DIAGNOSIS — M1710 Unilateral primary osteoarthritis, unspecified knee: Secondary | ICD-10-CM | POA: Diagnosis present

## 2011-01-11 DIAGNOSIS — Z7901 Long term (current) use of anticoagulants: Secondary | ICD-10-CM

## 2011-01-11 DIAGNOSIS — E785 Hyperlipidemia, unspecified: Secondary | ICD-10-CM | POA: Diagnosis present

## 2011-01-11 LAB — GLUCOSE, CAPILLARY
Glucose-Capillary: 141 mg/dL — ABNORMAL HIGH (ref 70–99)
Glucose-Capillary: 113 mg/dL — ABNORMAL HIGH (ref 70–99)

## 2011-01-11 LAB — BASIC METABOLIC PANEL
Chloride: 101 mEq/L (ref 96–112)
Creatinine, Ser: 1.03 mg/dL (ref 0.4–1.2)
GFR calc Af Amer: >60 mL/min (ref >60)
Potassium: 3.8 mEq/L (ref 3.5–5.1)
Sodium: 137 mEq/L (ref 135–145)

## 2011-01-11 LAB — CBC
HCT: 40.2 % (ref 36.0–46.0)
Hemoglobin: 13.4 g/dL (ref 12.0–15.0)
RBC: 4.14 MIL/uL (ref 3.87–5.11)
WBC: 6.4 10*3/uL (ref 4.0–10.5)

## 2011-01-11 LAB — BRAIN NATRIURETIC PEPTIDE: Pro B Natriuretic peptide (BNP): 108 pg/mL — ABNORMAL HIGH (ref 0.0–100.0)

## 2011-01-11 LAB — PROTIME-INR: INR: 1.66 — ABNORMAL HIGH (ref 0.00–1.49)

## 2011-01-12 DIAGNOSIS — Z5189 Encounter for other specified aftercare: Secondary | ICD-10-CM

## 2011-01-12 DIAGNOSIS — R5381 Other malaise: Secondary | ICD-10-CM

## 2011-01-12 DIAGNOSIS — H8110 Benign paroxysmal vertigo, unspecified ear: Secondary | ICD-10-CM

## 2011-01-12 DIAGNOSIS — F0781 Postconcussional syndrome: Secondary | ICD-10-CM

## 2011-01-12 NOTE — Consult Note (Signed)
NAME:  [name] [name], [name] [name]                    ACCOUNT NO.:  [id number]  MEDICAL RECORD NO.:  [id number]           [name] TYPE:  I  LOCATION:  1501                         FACILITY:  [facility]  PHYSICIAN:  Thana Farr, MD    DATE OF BIRTH:  08/20/1936  DATE OF CONSULTATION:  01/05/2011 DATE OF DISCHARGE:                                CONSULTATION   CONSULT CALLED BY:  Dr. Elyn Peers.  HISTORY:  [name] [name] is a 75 year old female, who presented after having an episode of passing out.  [name] [name] reports that she has had headaches for [name] past 3 weeks or so.  She describes [name] headaches as occipital and they are spreading to her entire head.  They are not associated with any nausea, vomiting or photophobia.  She does report that when [name] headaches get really bad, she does want to be in a dark place.  She rates her headaches at 10/10 when they are bad.  They do respond to her medications that she has at home, but do seem to recur. Since admission, she has had a workup that includes [name] head CT that was unremarkable.  [name] also had a CTA of [name] brain and neck that showed a left vertebral artery stenosis at approximately 50%-70% and left ICA stenosis at 70%.  [name] had echocardiogram as well that showed no evidence of thrombus.  With her headache at times, [name] [name] does report that she just feels off balance.  On [name] day of admission when she was trying to get out of bed, every time when she got out of bed, she just fell backwards.  When [name] [name] had [name] fall that led to this hospitalization, [name] [name] hit [name] back of her head.  She reports that she now has dizziness that it is more likely vertigo and describes a spinning sensation.  She reports that this is not like [name] sensation that she was having prior to [name] fall.  PAST MEDICAL HISTORY: 1. Atrial fibrillation, status post pacemaker placement, on Coumadin. 2. TIAs. 3. Vertigo. 4. Anxiety and depression. 5. Asthma. 6.  Hypertension. 7. Anemia. 8. Arthritis. 9. Bursitis. 10.Gout. 11.Pulmonary hypertension. 12.Mitral regurgitation. 13.Pulmonary vascular disease. 14.Hyperlipidemia. 15.Hiatal hernia. 16.GERD. 17.Hemorrhoids. 18.Irritable bowel syndrome. 19.Cystitis.  MEDICATIONS AT HOME: 1. Coumadin. 2. Potassium. 3. Neurontin. 4. Micardis. 5. Magnesium. 6. Lasix. 7. Hydrochlorothiazide. 8. Ranitidine. 9. Diltiazem. 10.Clonazepam. 11.Bisoprolol. 12.Ascorbic acid. 13.Alprazolam. 14.Zoloft. 15.Zocor. 16.Vitamin D3.  PHYSICAL EXAMINATION:  VITAL SIGNS:  Blood pressure 114/66, heart rate 63, respiratory rate 18, T-max 98.1 MENTAL STATUS TESTING:  [name] is alert and oriented.  She can follow commands without difficulty.  Speech is fluent. CRANIAL NERVE TESTING:  II:  Visual fields are intact.  III:, IV, VI: Extraocular movement intact.  V, VII:  Mild decrease in [name] right nasolabial fold instead of smile symmetric.  VIII:  Grossly intact.  IX and X:  Positive gag.  XI:  Bilateral shoulder shrug.  XII:  Midline tongue extension on motor exam. MOTOR TESTING:  [name] is 5/5 throughout.  There is normal tone and bulk. SENSORY:  Pinprick and light touch  are intact bilaterally.  Deep tendon reflexes are 1+ in [name] upper extremities and in [name] left knee and absent at [name] right knee.  Ankle jerks absent bilaterally.  Plantars are mute bilaterally.  On cerebellar testing, finger-to-nose and heel-to-shin intact.  LABORATORY DATA:  Hemoglobin and hematocrit 12.4 and 38.6 respectively, white blood cell count 6.6, platelet count 179.  Sodium 135, potassium 3.9, chloride 97, CO2 29, BUN and creatinine 20 and 0.9 respectively. Glucose 132, calcium 9.4, INR 2.44.  ASSESSMENT:  [name] [name] is a 75 year old female that presents with a headache of 3 weeks duration.  There was some associated balance issues as well.  [name] [name] has had all malignant causes ruled out for a headache which include  aneurysm, stroke, trauma and vertebral-basilar insufficiency.  [name] is on Coumadin.  She is adequately anticoagulated.  She does describe quite a bit of depression.  She has been in [name] process of having some medication changes as well, some for her heart and some for her depression.  Despite being on antidepressant therapy, she does have frequent crying spells. Cannot rule out [name] possibility that [name] headaches may be due to her depression.  It may also be related to medication changes as well.  Would like to rule out [name] possibility of other etiologies despite [name] fact that [name] [name] has no other official symptoms.  PLAN: 1. ESR and TSH to be drawn. 2. Would recommend [name] to return to her physician who is taking     care of antidepressant therapy and have this addressed. 3. Continue p.r.n. pain medications which do seem to be working for     her headache currently.          ______________________________ Thana Farr, MD     LR/MEDQ  D:  01/05/2011  T:  01/05/2011  Job:  [id number]  Electronically Signed by Thana Farr MD on 01/12/2011 02:09:32 PM

## 2011-01-13 LAB — PROTIME-INR
INR: 2.21 — ABNORMAL HIGH (ref 0.00–1.49)
Prothrombin Time: 24.7 seconds — ABNORMAL HIGH (ref 11.6–15.2)

## 2011-01-14 ENCOUNTER — Ambulatory Visit: Payer: Self-pay | Admitting: Internal Medicine

## 2011-01-14 LAB — CBC
MCH: 32.3 pg (ref 26.0–34.0)
MCHC: 33.6 g/dL (ref 30.0–36.0)
MCV: 96.3 fL (ref 78.0–100.0)
Platelets: 200 10*3/uL (ref 150–400)
RDW: 14.4 % (ref 11.5–15.5)
WBC: 8.1 10*3/uL (ref 4.0–10.5)

## 2011-01-14 LAB — COMPREHENSIVE METABOLIC PANEL
Albumin: 3.9 g/dL (ref 3.5–5.2)
BUN: 15 mg/dL (ref 6–23)
Calcium: 9.5 mg/dL (ref 8.4–10.5)
Creatinine, Ser: 0.78 mg/dL (ref 0.4–1.2)
Total Protein: 7.4 g/dL (ref 6.0–8.3)

## 2011-01-14 LAB — DIFFERENTIAL
Eosinophils Absolute: 0.4 10*3/uL (ref 0.0–0.7)
Eosinophils Relative: 5 % (ref 0–5)
Lymphs Abs: 1.8 10*3/uL (ref 0.7–4.0)

## 2011-01-14 LAB — PROTIME-INR: INR: 2.17 — ABNORMAL HIGH (ref 0.00–1.49)

## 2011-01-15 LAB — PROTIME-INR
INR: 2.57 — ABNORMAL HIGH (ref 0.00–1.49)
Prothrombin Time: 27.7 seconds — ABNORMAL HIGH (ref 11.6–15.2)

## 2011-01-16 ENCOUNTER — Encounter: Payer: Self-pay | Admitting: Internal Medicine

## 2011-01-16 LAB — PROTIME-INR
INR: 2.8 — ABNORMAL HIGH (ref 0.00–1.49)
Prothrombin Time: 29.6 seconds — ABNORMAL HIGH (ref 11.6–15.2)

## 2011-01-16 LAB — GLUCOSE, CAPILLARY: Glucose-Capillary: 131 mg/dL — ABNORMAL HIGH (ref 70–99)

## 2011-01-17 LAB — GLUCOSE, CAPILLARY: Glucose-Capillary: 182 mg/dL — ABNORMAL HIGH (ref 70–99)

## 2011-01-18 ENCOUNTER — Inpatient Hospital Stay (HOSPITAL_COMMUNITY): Payer: Medicare Other

## 2011-01-18 DIAGNOSIS — R5381 Other malaise: Secondary | ICD-10-CM

## 2011-01-18 DIAGNOSIS — F0781 Postconcussional syndrome: Secondary | ICD-10-CM

## 2011-01-18 DIAGNOSIS — Z5189 Encounter for other specified aftercare: Secondary | ICD-10-CM

## 2011-01-18 DIAGNOSIS — H8110 Benign paroxysmal vertigo, unspecified ear: Secondary | ICD-10-CM

## 2011-01-19 DIAGNOSIS — H8110 Benign paroxysmal vertigo, unspecified ear: Secondary | ICD-10-CM

## 2011-01-19 DIAGNOSIS — F0781 Postconcussional syndrome: Secondary | ICD-10-CM

## 2011-01-19 DIAGNOSIS — R5381 Other malaise: Secondary | ICD-10-CM

## 2011-01-19 DIAGNOSIS — Z5189 Encounter for other specified aftercare: Secondary | ICD-10-CM

## 2011-01-19 LAB — PROTIME-INR
INR: 2.28 — ABNORMAL HIGH (ref 0.00–1.49)
Prothrombin Time: 25.3 seconds — ABNORMAL HIGH (ref 11.6–15.2)

## 2011-01-19 NOTE — Discharge Summary (Signed)
NAME:  [name], [name]                    ACCOUNT NO.:  [id number]  MEDICAL RECORD NO.:  [id number]           PATIENT TYPE:  LOCATION:                                 FACILITY:  PHYSICIAN:  Mauro Kaufmann, MD         DATE OF BIRTH:  July 07, 1936  DATE OF ADMISSION: DATE OF DISCHARGE:                              DISCHARGE SUMMARY   ADDENDUM: The patient's discharge summary was done on January 08, 2011 by Dr. Pablo Lawrence as an addendum.  The patient's family refused to go to nursing home as the nursing home was not able to provide a vestibular PT.  So later, we got a inpatient rehab to see the patient and the patient has been accepted at the inpatient rehab at [facility] where she will be given the intense therapy for the vestibular rehab PT.  At this time, the patient is stable.  The patient also became more dizzy in the hospital and was found to be having orthostatic hypotension.  She is requiring more IV fluids.  We also noted that the patient was on Lasix in the hospital.  I will check the BNP, which is not terribly elevated and it is only 108 and the patient is not having any fluid overload issues, so I am going to stop the Lasix at this time.  The patient also was on hydrochlorothiazide, I am going to stop that too, as that exacerbate dizziness and cause worsening of possible hypertension.  The patient has a history of varicose veins I am going to recommend TED hose for the patient for the post hypertension and also we have stopped the medication.  Hopefully with the rehab and TED hose, the patient was start feeling better.  The patient also has left ICA stenosis and was seen by Neurology and they recommend to follow up with the Vascular Surgery.  The patient has seen Dr. Gretta Began, earlier in the past and so we are going to recommend follow up with Dr. Tawanna Cooler Early as outpatient and CAR is aware of making an appointment at the time of discharge.  The patient had an UTI for which she  completed the course of antibiotics in the hospital and she does not need any more antibiotics for that.  The patient is stable for discharge to the rehab.  DISCHARGE MEDICATIONS:  Medication on discharge include, 1. Lortab 5/325 one to two tabs every 6 hours as needed. 2. Meclizine 25 mg p.o. t.i.d. as needed. 3. Protonix 40 mg p.o. daily. 4. Topamax 50 mg p.o. daily at bedtime. 5. Bisoprolol 10 mg 1 tablet q.a.m. and half tablet at bedtime. 6. Warfarin 5 mg p.o. daily. 7. Alprazolam 0.25 mg 1 tablet p.o. t.i.d. as needed. 8. Ascorbic acid 500 mg 1 tablet p.o. daily. 9. Diltiazem 120 mg 1 tablet p.o. daily. 10.Famotidine 20 mg 1 tablet p.o. daily at bedtime. 11.Magnesium oxide 400 mg half tablet p.o. daily. 12.Micardis 40 mg 1 tablet p.o. daily at bedtime. 13.Neurontin 300 mg one capsule in the a.m., two at lunch, and one at  dinner and two at bedtime. 14.Vitamin D3 1000 units one capsule daily. 15.Zocor 10 mg 1 tablet p.o. daily at bedtime. 16.Zoloft 100 mg 1 tablet p.o. twice a day.  Please make a note that the patient's is still subtherapeutic and pharmacy is following the patient and the currently at this time on the day of discharge, the patient's INR is 1.66 and pharmacy is going to follow the patient up at the rehab and manage the patient's Coumadin.     Mauro Kaufmann, MD     GL/MEDQ  D:  01/11/2011  T:  01/11/2011  Job:  [id number]  Electronically Signed by Mauro Kaufmann  on 01/19/2011 12:03:37 PM

## 2011-01-21 ENCOUNTER — Encounter: Payer: Self-pay | Admitting: Internal Medicine

## 2011-01-24 LAB — DIFFERENTIAL
Basophils Relative: 0 % (ref 0–1)
Eosinophils Absolute: 0.2 10*3/uL (ref 0.0–0.7)
Lymphs Abs: 1.5 10*3/uL (ref 0.7–4.0)
Monocytes Relative: 7 % (ref 3–12)
Neutro Abs: 8.2 10*3/uL — ABNORMAL HIGH (ref 1.7–7.7)
Neutrophils Relative %: 77 % (ref 43–77)

## 2011-01-24 LAB — D-DIMER, QUANTITATIVE: D-Dimer, Quant: 2.39 ug/mL-FEU — ABNORMAL HIGH (ref 0.00–0.48)

## 2011-01-24 LAB — COMPREHENSIVE METABOLIC PANEL
AST: 34 U/L (ref 0–37)
Albumin: 3.6 g/dL (ref 3.5–5.2)
Alkaline Phosphatase: 50 U/L (ref 39–117)
BUN: 14 mg/dL (ref 6–23)
GFR calc Af Amer: >60 mL/min (ref >60)
Potassium: 3.6 mEq/L (ref 3.5–5.1)
Sodium: 137 mEq/L (ref 135–145)
Total Protein: 6.9 g/dL (ref 6.0–8.3)

## 2011-01-24 LAB — PROTIME-INR
INR: 1.83 — ABNORMAL HIGH (ref 0.00–1.49)
Prothrombin Time: 21.5 seconds — ABNORMAL HIGH (ref 11.6–15.2)
Prothrombin Time: 30.5 seconds — ABNORMAL HIGH (ref 11.6–15.2)

## 2011-01-24 LAB — MAGNESIUM: Magnesium: 2.2 mg/dL (ref 1.5–2.5)

## 2011-01-24 LAB — POCT CARDIAC MARKERS
CKMB, poc: 2.1 ng/mL (ref 1.0–8.0)
Myoglobin, poc: 77.3 ng/mL (ref 12–200)

## 2011-01-24 LAB — POCT I-STAT, CHEM 8
Hemoglobin: 13.3 g/dL (ref 12.0–15.0)
Potassium: 3.2 mEq/L — ABNORMAL LOW (ref 3.5–5.1)
Sodium: 139 mEq/L (ref 135–145)
TCO2: 27 mmol/L (ref 0–100)

## 2011-01-24 LAB — HEPATIC FUNCTION PANEL
ALT: 15 U/L (ref 0–35)
Albumin: 3.9 g/dL (ref 3.5–5.2)
Indirect Bilirubin: 1.3 mg/dL — ABNORMAL HIGH (ref 0.3–0.9)
Total Protein: 7.6 g/dL (ref 6.0–8.3)

## 2011-01-24 LAB — CBC
HCT: 36.4 % (ref 36.0–46.0)
Hemoglobin: 11.8 g/dL — ABNORMAL LOW (ref 12.0–15.0)
Hemoglobin: 12.5 g/dL (ref 12.0–15.0)
MCH: 32.8 pg (ref 26.0–34.0)
MCV: 100.6 fL — ABNORMAL HIGH (ref 78.0–100.0)
Platelets: 274 10*3/uL (ref 150–400)
RBC: 3.62 MIL/uL — ABNORMAL LOW (ref 3.87–5.11)
RBC: 3.81 MIL/uL — ABNORMAL LOW (ref 3.87–5.11)
WBC: 10.4 10*3/uL (ref 4.0–10.5)

## 2011-01-24 LAB — CARDIAC PANEL(CRET KIN+CKTOT+MB+TROPI)
CK, MB: 3.3 ng/mL (ref 0.3–4.0)
Relative Index: 3.6 — ABNORMAL HIGH (ref 0.0–2.5)
Total CK: 103 U/L (ref 7–177)
Troponin I: 0.02 ng/mL (ref 0.00–0.06)
Troponin I: 0.02 ng/mL (ref 0.00–0.06)

## 2011-01-24 LAB — BASIC METABOLIC PANEL
Chloride: 98 mEq/L (ref 96–112)
GFR calc non Af Amer: >60 mL/min (ref >60)
Glucose, Bld: 130 mg/dL — ABNORMAL HIGH (ref 70–99)
Potassium: 3.2 mEq/L — ABNORMAL LOW (ref 3.5–5.1)
Sodium: 135 mEq/L (ref 135–145)

## 2011-01-24 LAB — LIPID PANEL
Cholesterol: 188 mg/dL (ref 0–200)
HDL: 31 mg/dL — ABNORMAL LOW (ref >39)

## 2011-01-24 LAB — TROPONIN I: Troponin I: 0.02 ng/mL (ref 0.00–0.06)

## 2011-01-24 LAB — CK TOTAL AND CKMB (NOT AT ARMC): Relative Index: 3 — ABNORMAL HIGH (ref 0.0–2.5)

## 2011-01-24 LAB — APTT: aPTT: 42 seconds — ABNORMAL HIGH (ref 24–37)

## 2011-01-25 LAB — SURGICAL PCR SCREEN
MRSA, PCR: NEGATIVE
Staphylococcus aureus: POSITIVE — AB

## 2011-01-25 LAB — HEMOGLOBIN AND HEMATOCRIT, BLOOD
HCT: 33 % — ABNORMAL LOW (ref 36.0–46.0)
HCT: 33.7 % — ABNORMAL LOW (ref 36.0–46.0)
HCT: 36.6 % (ref 36.0–46.0)
Hemoglobin: 11.4 g/dL — ABNORMAL LOW (ref 12.0–15.0)
Hemoglobin: 11.5 g/dL — ABNORMAL LOW (ref 12.0–15.0)
Hemoglobin: 12.6 g/dL (ref 12.0–15.0)

## 2011-01-25 LAB — DIFFERENTIAL
Lymphocytes Relative: 19 % (ref 12–46)
Monocytes Absolute: 0.3 10*3/uL (ref 0.1–1.0)
Monocytes Relative: 5 % (ref 3–12)
Neutro Abs: 4.8 10*3/uL (ref 1.7–7.7)

## 2011-01-25 LAB — COMPREHENSIVE METABOLIC PANEL
AST: 40 U/L — ABNORMAL HIGH (ref 0–37)
Albumin: 4.5 g/dL (ref 3.5–5.2)
BUN: 13 mg/dL (ref 6–23)
Chloride: 100 mEq/L (ref 96–112)
Creatinine, Ser: 0.78 mg/dL (ref 0.4–1.2)
GFR calc Af Amer: >60 mL/min (ref >60)
Potassium: 4.2 mEq/L (ref 3.5–5.1)
Total Protein: 7.9 g/dL (ref 6.0–8.3)

## 2011-01-25 LAB — PROTIME-INR
INR: 1.3 (ref 0.00–1.49)
INR: 1.63 — ABNORMAL HIGH (ref 0.00–1.49)
INR: 2.1 — ABNORMAL HIGH (ref 0.00–1.49)
Prothrombin Time: 16.2 seconds — ABNORMAL HIGH (ref 11.6–15.2)
Prothrombin Time: 19.5 seconds — ABNORMAL HIGH (ref 11.6–15.2)
Prothrombin Time: 22.6 seconds — ABNORMAL HIGH (ref 11.6–15.2)

## 2011-01-25 LAB — ABO/RH: ABO/RH(D): O NEG

## 2011-01-25 LAB — URINALYSIS, ROUTINE W REFLEX MICROSCOPIC
Hgb urine dipstick: NEGATIVE
Ketones, ur: NEGATIVE mg/dL
Nitrite: NEGATIVE
Nitrite: NEGATIVE
Protein, ur: NEGATIVE mg/dL
Protein, ur: NEGATIVE mg/dL
Specific Gravity, Urine: 1.014 (ref 1.005–1.030)
Urobilinogen, UA: 1 mg/dL (ref 0.0–1.0)
pH: 8 (ref 5.0–8.0)

## 2011-01-25 LAB — URINE CULTURE: Special Requests: NEGATIVE

## 2011-01-25 LAB — CBC
MCH: 33.7 pg (ref 26.0–34.0)
MCV: 98.5 fL (ref 78.0–100.0)
Platelets: 189 10*3/uL (ref 150–400)
RBC: 4.49 MIL/uL (ref 3.87–5.11)
RDW: 14.8 % (ref 11.5–15.5)
WBC: 6.6 10*3/uL (ref 4.0–10.5)

## 2011-01-25 LAB — URINE MICROSCOPIC-ADD ON

## 2011-01-25 LAB — APTT
aPTT: 38 seconds — ABNORMAL HIGH (ref 24–37)
aPTT: 57 seconds — ABNORMAL HIGH (ref 24–37)

## 2011-01-25 NOTE — Discharge Summary (Signed)
NAME:  [name] [name], [name] [name]                    ACCOUNT NO.:  [id number]  MEDICAL RECORD NO.:  [id number]           PATIENT TYPE:  I  LOCATION:  4010                         FACILITY:  [facility]  PHYSICIAN:  Ranelle Oyster, M.D.DATE OF BIRTH:  May 03, 1936  DATE OF ADMISSION:  01/11/2011 DATE OF DISCHARGE:  01/18/2011                              DISCHARGE SUMMARY   DISCHARGE DIAGNOSES: 1. Vertebral artery insufficiency with scalp hematoma. 2. Postconcussive syndrome with scalp hematoma. 3. Chronic Coumadin therapy. 4. Pain management. 5. Depression with anxiety. 6. Hypertension. 7. Escherichia coli urinary tract infection - resolved. 8. Hyperlipidemia. 9. Gastroesophageal flex disease. 10.History of left internal carotid artery stenosis.  This is a 75 year old white female with history of chronic atrial fibrillation on Coumadin therapy, admitted on January 02, 2011, with headache and stumbling gait with fall without loss of consciousness. Cranial CT scan showed a large scalp hematoma, negative for intracranial acute changes.  Scalp laceration repaired with staples in place.  CT angiogram with no large-vessel pathology.  CT angio of the neck was 70% stenosis in the proximal left internal carotid artery.  Carotid Dopplers with left 60-79% left internal artery stenosis.  Echocardiogram with ejection fraction 60% with no wall motion abnormalities.  Cardiology followup Dr. Patty Sermons, and events not felt to be cardiac related, remained on chronic Coumadin therapy.  She did have a history of left internal carotid artery stenosis and plans followup with Vascular Surgery as an outpatient.  She had been placed on Cipro for an Escherichia coli urinary tract infection.  She required minimal assist for ambulation.  She was admitted for comprehensive rehab program.  PAST MEDICAL HISTORY:  See discharge diagnoses.  Remote smoker.  No alcohol.  ALLERGIES:  PENICILLIN, SULFA, VERAPAMIL, NITROGLYCERIN,  OXYCODONE, and ASPIRIN.  SOCIAL HISTORY:  Widowed, lives alone.  Daughter plans assist as needed.  FUNCTIONAL HISTORY:  Prior to admission was independent with a cane. She does drive.  FUNCTIONAL STATUS:  Upon admission to Rehab Services was minimal assist bed mobility and transfers, minimal assist ambulate 70 feet with the rolling walker, minimal assist for activities of daily living.  MEDICATIONS:  Prior to admission were: 1. Coumadin 5 mg daily. 2. Potassium chloride 20 mEq daily. 3. Neurontin 300 mg 4 times daily. 4. Micardis 40 mg at bedtime. 5. Vitamin D daily. 6. Magnesium oxide 400 mg daily. 7. Lasix 40 mg daily. 8. Hydrochlorothiazide 25 mg at bedtime. 9. Pepcid 20 mg at bedtime. 10.Diltiazem 120 mg daily. 11.Klonopin 0.5 mg at bedtime. 12.Zebeta 10 mg at bedtime. 13.Xanax 0.25 mg 3 times daily. 14.Zoloft 100 mg twice daily. 15.Zocor 10 mg at bedtime.  PHYSICAL EXAMINATION:  VITAL SIGNS:  Blood pressure 143/66, pulse 73, temperature 97.6, respirations 20. GENERAL:  This was an alert female, in no acute distress, mild anxiety, oriented x3. MUSCULOSKELETAL:  Deep tendon reflexes 2+. HEENT:  Mild nystagmus with lateral gaze.  Scalp hematoma clean and dry. LUNGS:  Clear to auscultation. CARDIAC:  Rate controlled. ABDOMEN:  Soft, nontender.  Good bowel sounds.  REHABILITATION HOSPITAL COURSE:  The patient was admitted to the Inpatient Rehab  Services with therapies initiated on a 3-hour daily basis consisting of physical therapy, occupational therapy, and rehabilitation nursing.  The following issues were addressed during the patient's rehabilitation stay.  Pertaining to Ms. Huyett's vertebral artery insufficiency, scalp hematoma after a fall, postconcussive syndrome remained stable.  She was attending therapies with progressive mobility.  She remained on chronic Coumadin for atrial fibrillation, followed by Dr. Patty Sermons of Cardiology Services.  Latest INR of  2.8 with a goal INR of 2.0-3.0.  Her blood pressures remained well controlled with no orthostatic changes.  She continued on Zebeta, Cardizem, Benicar.  Pain management with the use of Robaxin as well as Vicodin and meclizine had been added for her dizziness with good results.  She remained on her Xanax and Zoloft for history of depression and anxiety.  She had completed a course of Cipro for an Escherichia coli urinary tract infection with no dysuria or hematuria.  She would follow up with outpatient Vascular Surgery for history of left internal carotid artery stenosis which she had seen Dr. Arbie Cookey in the past. Discharge plan remained in regards to discharged home versus assisted living facility with case management continuing to address this with the patient and family.  Her daughter would be able to assist as advised on discharge.  The patient received weekly collaborative interdisciplinary team conferences to discuss estimated length of stay, family teaching, and any barriers to discharge.  She is minimal assist for activities of daily living, minimal assist for mobility, again monitored for her vertigo.  She was able to attend to self-care as needed.  Anticipated discharge date was set for January 18, 2011.  DISCHARGE MEDICATIONS:  At the time of dictation included: 1. Coumadin with latest dose of 2.5 mg adjusted accordingly for a goal     INR of 2.0-3.0. 2. Vitamin C 500 mg daily. 3. Zebeta 10 mg in the a.m. 5 mg in the p.m. 4. Neurontin 300 mg twice daily at 0800 and 1700 hours, 600 mg at 1200     and 2200 hours. 5. Cardizem CD 120 mg daily. 6. Vitamin D 1000 units daily. 7. Pepcid 20 mg at bedtime. 8. Magnesium oxide 200 mg daily. 9. Meclizine 25 mg 3 times daily. 10.Benicar 20 mg at bedtime. 11.Potassium chloride 20 mEq daily. 12.Zoloft 100 mg twice daily. 13.Zocor 10 mg at bedtime. 14.Robaxin 500 mg every 6 hours as needed for muscle spasms, dispensed     60  tablets. 15.Vicodin 5/325, 1 or 2 tablets every 4 hours as needed pain,     dispensed 90 tablets.  Her diet was regular.  SPECIAL INSTRUCTIONS:  No alcohol.  No driving.  Continue therapies as advised to [employer].  The patient should follow up with Dr. Lupe Carney, medical management in 2 weeks, Dr. Faith Rogue at the Outpatient Rehab Service office as directed.  Arrangements were made for ongoing Coumadin therapy.  Home health nurse versus office followup. Results to Dr. Patty Sermons.     Mariam Dollar, P.A.   ______________________________ Ranelle Oyster, M.D.    DA/MEDQ  D:  01/17/2011  T:  01/17/2011  Job:  [id number]  cc:   L. Lupe Carney, M.D. Cassell Clement, M.D. Thana Farr, MD  Electronically Signed by Mariam Dollar P.A. on 01/21/2011 12:41:28 PM Electronically Signed by Faith Rogue M.D. on 01/25/2011 04:13:28 PM

## 2011-01-25 NOTE — H&P (Signed)
NAME:  [name] [name], [name] [name]                    ACCOUNT NO.:  [id number]  MEDICAL RECORD NO.:  [id number]           PATIENT TYPE:  I  LOCATION:  4010                         FACILITY:  [facility]  PHYSICIAN:  Ranelle Oyster, M.D.DATE OF BIRTH:  Apr 23, 1936  DATE OF ADMISSION:  01/11/2011 DATE OF DISCHARGE:                             HISTORY & PHYSICAL   CHIEF COMPLAINT:  Weakness and dizziness related to recent fall.  PRIMARY CARE PHYSICIAN:  Doran Durand, MD.  CARDIOLOGIST:  Cassell Clement, M.D.  NEUROLOGIST:  Thana Farr, MD.  HISTORY OF PRESENT ILLNESS:  This 75 year old white female with chronic AFib on Coumadin, with history of TIA and vertigo was admitted on February 22 with headache and stumbling gait with fall, without loss of consciousness.  She had a large scalp hematoma on admission, but the CT was negative for any intracranial changes.  Her scalp laceration was repaired.  CT angiogram showed no large vessel pathology.  CT angio of the neck however showed less than 70% stenosis of the proximal left ICA. Carotid Dopplers showed left 60% to 79% stenosis, left ICA. Echocardiogram was unremarkable.  Cardiology saw the patient and felt these events were not cardiac related.  Plan is to follow up with vascular surgery regarding left ICA stenosis in the future as an outpatient.  The patient was placed on Topamax for headache, with good results and meclizine for dizziness.  She has E. Coli UTI, and Cipro was started for this on the 26th.  I saw the patient for rehab assessment yesterday and felt she could benefit from inpatient stay.  REVIEW OF SYSTEMS:  Notable for anxiety, depression, low back pain, and heart palpitations.  Full review is in the written H and P.  PAST MEDICAL HISTORY:  Positive for CAF with a permanent pacemaker, TIA, vertigo, anxiety, depression, asthma, anemia, DJD, hypertension, hyperlipidemia, GERD, IBS, PVD, back surgery, T and A, hysterectomy, right  total knee replacement in 2011 with residual right knee pain, tobacco use remotely, and no alcohol history.  FAMILY HISTORY:  Positive for CAD and cancer.  SOCIAL HISTORY:  The patient is a widow, lives alone.  Daughter plans to assist.  ALLERGIES:  PENICILLIN, SULFA, VERAPAMIL, NITROGLYCERIN, OXYCODONE, ASPIRIN.  HOME MEDICATIONS: 1. Coumadin. 2. KCl. 3. Neurontin. 4. Micardis. 5. Vitamin D. 6. Mag oxide. 7. Lasix. 8. Hydrochlorothiazide. 9. Diltiazem. 10.Klonopin. 11.Zebeta. 12.Xanax. 13.Zoloft. 14.Zocor.  LABORATORY DATA:  Please see written H and P.  PHYSICAL EXAMINATION:  VITAL SIGNS:  Blood pressure is 143/66, pulse 73, respiratory rate 20, and temperature 97.6. GENERAL:  The patient is pleasant, alert, a bit photophobic. EAR, NOSE AND THROAT:  Essentially unremarkable with intact dentition. NECK:  Supple without JVD or lymphadenopathy. CHEST:  Clear without wheezes, rales or rhonchi. HEART:  Regular rate and rhythm without murmur, rubs, or gallops. ABDOMEN:  Soft, nontender.  Bowel sounds are positive.SKIN:  Generally intact in all areas. EXTREMITIES:  Knee was slightly swollen with some pain with active and passive range of motion. NEUROLOGIC:  Cranial nerves II-XII notable for double vision and difficulty with lateral gaze, either side.  Some  nystagmus is seen with right and leftward gaze, of 4 to 5 beats.  The patient was photophobic and sensitive to noise somewhat as well.  Reflexes are 1+.  Sensation is grossly intact in all limbs.  She had no focal limb ataxia.  Strength is 4/5 in upper extremities, proximal and distal.  Right lower extremity is 3/5 in the hip and knee due to pain, 4/5 at the ankle, and 4/5 left lower extremity.  Judgment, orientation, and memory are appropriate. Mood was a bit dampened due to her pain, but otherwise intact.  POST-ADMISSION PHYSICIAN EVALUATION: 1. Functional deficit secondary to vertebral artery insufficiency and      history of vertigo as well as osteoarthritis right knee with     residual pain after right knee replacement. 2. The patient is admitted to receive collaborative interdisciplinary     care between the physiatrist, rehab nursing staff, and therapy     team. 3. The patient's level of medical complexity and substantial therapy     needs in context of the medical necessity cannot be provided at a     lesser intensity of care. 4. The patient has experienced substantial functional loss from her     baseline.  Upon functional assessment at the time of preadmission     screening, patient was min assist bed mobility, min assist with     basic gait 70 feet, min to mod assist ADLs.  Judging by the     patient's diagnosis, physical exam, and functional history, the     patient has potential for functional progress which will result in     measurable gains while in inpatient rehab.  These gains will be of     substantial and practical use upon discharge to home and facilitate     mobility and self-care.  Interval changes since our consult are     detailed above. 5. The physiatrist will provide 24-hour management of medical needs as     well as oversight of the therapy plan, assess treatment, and     provide guidance as appropriate regarding interaction of the two.     Medical problem list and plan are below. 6. The 24-hour rehab nursing team will assist in managing the     patient's skin care needs as well as bowel and bladder function,     safety awareness, and integration of therapy concept techniques. 7. PT will assess and treat for lower extremity strength, range of     motion, functional mobility.  Gait goals are modified independent. 8. OT will assess and treat for upper extremity use ADLs, adaptive     equipment, functional mobility, safety, balance, vestibular     assessment and treatment as well with goals of modified independent     to supervision. 9. Case management and social worker will  assess and treat for     psychosocial issues and discharge planning. 10.Team conference will be held weekly to assess progress towards     goals and determine barriers to discharge. 11.The patient has demonstrated sufficient medical stability and     exercise capacity to tolerate at least 3 hours of therapy per day,     at least 5 days per week. 12.Estimated length of stay is 7-10 days.  Prognosis is good.  MEDICAL PROBLEMS AND PLAN: 1. DVT prophylaxis/anticoagulation with Coumadin and Lovenox.  Most     recent INR 1.59. 2. Pain management with p.r.n. Vicodin, Neurontin, and Topamax. 3. Mood control with  Xanax and Zoloft.  Provide ego support and     positive reinforcement on rehab.  The patient should progress     nicely overall from a rehab and functional standpoint. 4. Blood pressure control with Zebeta, Cardizem, and Benicar. 5. E.  Coli UTI, Cipro. 6. Hyperlipidemia:  Zocor. 7. History of left ICA stenosis.  Outpatient vascular followup is     recommended.  I question whether some of the symptoms are related     also to vertebral artery insufficiency. 8. Headaches:  See above.  Continue with Neurontin and Topamax as     advised.     Ranelle Oyster, M.D.     ZTS/MEDQ  D:  01/11/2011  T:  01/12/2011  Job:  [id number]  cc:   Doran Durand, MD  Electronically Signed by Faith Rogue M.D. on 01/25/2011 04:12:41 PM

## 2011-01-25 NOTE — Discharge Summary (Signed)
  NAME:  [name] [name], [name] [name]                    ACCOUNT NO.:  [id number]  MEDICAL RECORD NO.:  [id number]           PATIENT TYPE:  I  LOCATION:  4010                         FACILITY:  [facility]  PHYSICIAN:  Ranelle Oyster, M.D.DATE OF BIRTH:  [birth year]  DATE OF ADMISSION:  01/11/2011 DATE OF DISCHARGE:  01/21/2011                              DISCHARGE SUMMARY   ADDENDUM  The patient is attending therapies, doing well, anticipated discharge of March 9, however, due to limited assistance at home, it was felt by the patient's family that perhaps skilled nursing facility would be needed, thus discharge was extended until nursing home bed was able to be done by case management with bed becoming available on March 12.  During the interim, the patient remained stable.  There was some question of borderline diabetes mellitus other than she was on no medications prior to hospital admission other than diet.  Check of blood sugars 169, 131, 182.  She was changed to a carbohydrate-modified high diet.  She remained on chronic Coumadin therapy for atrial fibrillation with INR of 2.28 on March 10.  This will continued and need to be followed at the skilled nursing facility.  Noted small scalp hematoma related to fall of incident, prior to hospital admission, left occipital region, 2 cm, it was mildly tender.  She was placed on empiric Keflex 250 mg 3 times daily x7 days on March 11.  She remained afebrile.  All other medications could be noted on body of original discharge summary.     Mariam Dollar, P.A.   ______________________________ Ranelle Oyster, M.D.    DA/MEDQ  D:  01/21/2011  T:  01/21/2011  Job:  [id number]  cc:   L. Lupe Carney, M.D. Ranelle Oyster, M.D.  Electronically Signed by Mariam Dollar P.A. on 01/21/2011 12:41:35 PM Electronically Signed by Faith Rogue M.D. on 01/25/2011 04:13:31 PM

## 2011-01-28 ENCOUNTER — Encounter: Payer: Self-pay | Admitting: *Deleted

## 2011-01-28 DIAGNOSIS — I4891 Unspecified atrial fibrillation: Secondary | ICD-10-CM

## 2011-01-29 ENCOUNTER — Other Ambulatory Visit: Payer: Self-pay

## 2011-01-31 ENCOUNTER — Other Ambulatory Visit: Payer: Self-pay | Admitting: *Deleted

## 2011-02-01 ENCOUNTER — Telehealth: Payer: Self-pay | Admitting: *Deleted

## 2011-02-01 NOTE — Telephone Encounter (Signed)
Adv nurse voicemail re-instructions from Dr. Patty Sermons

## 2011-02-01 NOTE — Telephone Encounter (Signed)
Phone call from [facility]. Nurse should zoloft dose be one or two daily? Also when should they re-check Inr

## 2011-02-01 NOTE — Telephone Encounter (Signed)
zoloft 100 daily. Recheck inr 1 week.deperi

## 2011-02-06 ENCOUNTER — Telehealth: Payer: Self-pay | Admitting: Cardiology

## 2011-02-06 NOTE — Telephone Encounter (Signed)
PT HAS A QUESTION ABOUT HER BP MED, ALSO, LAY OFF OF COUMADIN FOR AWHILE.

## 2011-02-07 ENCOUNTER — Telehealth: Payer: Self-pay | Admitting: Cardiology

## 2011-02-07 NOTE — H&P (Signed)
NAME:  [name] [name], [name] [name]                    ACCOUNT NO.:  [id number]  MEDICAL RECORD NO.:  [id number]           PATIENT TYPE:  I  LOCATION:  1501                         FACILITY:  [facility]  PHYSICIAN:  Kathlen Mody, MD       DATE OF BIRTH:  Aug 27, 1936  DATE OF ADMISSION:  01/02/2011 DATE OF DISCHARGE:                             HISTORY & PHYSICAL   PRIMARY CARE PHYSICIAN:  Doran Durand, M.D.  CARDIOLOGIST:  Cassell Clement, M.D.  CHIEF COMPLAINTS:  Fall with severe headache and dizziness.  HISTORY OF PRESENT ILLNESS:  This is a 75 year old female, who presents to the ED with multiple complaints.  She has a history of atrial fibrillation.  She is on chronic Coumadin.  She also has a history of pulmonary hypertension and vertigo.  She reports a severe occipital headache for the last 3 weeks that is not helped by her opiate pain medications that she takes for her back.  The headache comes in the morning, it dissipates in the afternoon and comes back in the evening. It is usually very severe in the evening.  For 3 weeks with the headache, she has been stumbling.  She has felt dizzy and occasionally had blurred vision.  This a.m., the patient had difficulty getting out of bed.  She finally made it to the bathroom, but she does not remember falling in the bathroom.  She woke and had a large hematoma on her head that required 4 staples in the emergency department.  She pressed life alert and EMS came to pick her up.  She is also complaining of progressive dyspnea on exertion.  She has had that for a long time, but tells me that it is worse than usual.  PAST MEDICAL HISTORY:  Significant for 1. Atrial fibrillation.  She is status post pacemaker, on chronic     Coumadin. 2. TIAs. 3. Vertigo. 4. Anxiety and depression. 5. Asthma. 6. Hypertension. 7. Anemia. 8. Arthritis. 9. Bursitis. 10.Gout. 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  She is status post total     knee  replacement. 12.Pulmonary hypertension. 13.Mitral regurgitation. 14.Peripheral vascular disease. 15.Hyperlipidemia. 16.Hiatal hernia. 17.GERD. 18.Hemorrhoids. 19.IBS. 20.Cystitis.  PAST SURGICAL HISTORY:  She has a: 1. Status post back surgery. 2. Tonsillectomy. 3. Appendectomy. 4. Hysterectomy. 5. As well as pacemaker placement.  HOME MEDICATIONS:  Are as follows: 1. Warfarin 5 mg half to one tablet daily as per regimen. 2. Potassium 20 mEq 1 tablet daily. 3. Neurontin 300 mg 1-2 tablets four times daily. 4. Micardis 40 mg 1 tablet daily at bedtime. 5. Magnesium oxide 400 dose half tab daily. 6. Lasix 40 mg 1 tablet at bedtime. 7. Hydrochlorothiazide 25 mg 1 tablet daily at bedtime. 8. Ranitidine 20 mg 1 tablet daily at bedtime. 9. Diltiazem 120 mg 1 tablet daily. 10.Clonazepam 0.5 mg 1 tablet daily at bedtime. 11.Bisoprolol 10 mg 1 tablet daily at bedtime. 12.Ascorbic acid 500 mg 1 tablet daily. 13.Alprazolam 0.25 mg 1 tablet three times a day as needed p.r.n.     anxiety. 14.Zoloft 100 mg 1 tablet twice daily. 15.Zocor 10 mg  1 tablet daily at bedtime. 16.Vitamin D3 1000 units 1 tablet daily.  ALLERGIES:  She has multiple allergies including: 1. PENICILLIN. 2. SULFA. 3. VERAPAMIL. 4. NITROGLYCERIN. 5. OXYCODONE. 6. ACETAMINOPHEN. 7. ASPIRIN.  REVIEW OF SYSTEMS:  CONSTITUTIONAL:  As per HPI, otherwise the patient is not complaining of any sore throat or decreased appetite.  She is drinking and eating well. CARDIOVASCULAR:  She endorses chest pain.  She says she has had it three times over the past week.  It is sharp in nature, radiates up her neck and into her head.  She also endorses increased palpitations.  She does have palpitations chronically, but she says they have become increased more recently. RESPIRATORY:  She denies any cough, hemoptysis or difficulty breathing. She does endorse dyspnea on exertion that has been worse recently. GASTROINTESTINAL:   She denies any vomiting or changes to her bowel habits. GENITOURINARY:  She denies any hematuria, dysuria, frequency. EXTREMITIES:  She denies any decreased range of motion, acute pain or swelling. SKIN:  She denies any rashes, bruises or lesions. NEUROLOGIC:  She is here for fall, possible syncope.  Denies any seizures. PSYCHIATRIC:  She denies any suicidal ideations or depression.  FAMILY HISTORY:  Significant for lymphoma, breast cancer, diabetes and heart disease.  SOCIAL HISTORY:  She is an ex-smoker.  She has two children.  Her husband passed in May 2011.  PHYSICAL EXAMINATION:  GENERAL:  This is a well-developed Caucasian female, lying in the dark in [facility] Emergency Department. VITAL SIGNS:  Temperature 98.6, pulse 79, respirations 19, blood pressure 165/79. HEAD:  Atraumatic, normocephalic. EYES:  Anicteric with pupils are equal and round. NOSE:  Shows no nasal discharge or exterior lesions. MOUTH:  Has moist mucous membranes with good dentition. NECK:  Supple with midline trachea.  No JVD.  No lymphadenopathy. CHEST:  Demonstrates no accessory muscle use.  She has no wheezes or crackles to my auscultation. HEART:  Irregularly irregular and chronic atrial fibrillation without murmurs, rubs or gallops. ABDOMEN:  Soft, nontender, nondistended with good bowel sounds. EXTREMITIES:  Showed no clubbing, cyanosis or edema.  She is able to move all four extremities without difficulty. SKIN:  Shows no rashes, bruises or lesions. NEUROLOGIC:  Cranial nerves II through XII appear to be grossly intact. She has no facial asymmetries, no obvious focal neuro deficits. PSYCHIATRIC:  Patient is alert and oriented.  She describes depression as her husband passed last May and she lives alone, otherwise, she is well-groomed and cooperative and completely appropriate.  LABORATORY DATA:  Labs are significant for white count of 9.6, hemoglobin of 14.4, hematocrit 43.9, platelets 169,  neutrophils 82%.  PT 26.6, INR 2.44.  Sodium 135, potassium 3.5, chloride 98, bicarb 27, glucose 171, BUN 15, creatinine 0.8.  The first set of cardiac enzymes is negative.  Urinalysis is positive for large blood, 100 protein, nitrite positive, leukocytes moderate and 11-20 white blood cells.  RADIOLOGICAL STUDIES:  Radiological exams included a CT of her head that shows a left parietal scalp hematoma and laceration.  CT of her C spine was negative for fracture.  The patient is pending a CT angio of her head and neck.  ASSESSMENT:  Dr. Kathlen Mody had seen and examined the patient, collected history, reviewed her chart and spoken at length with the patient and the PA about the case and her impression is this is a 69- year-old female with a history of atrial fibrillation, on chronic Coumadin with: 1. A fall today, possible syncope  with head contusion/hematoma. 2. She is to be evaluated for syncope. 3. Chest pain with worsening dyspnea on exertion and     electrocardiographic changes. 4. Severe headache over the 3-week period with photophobia and blurred     vision. 5. Hyperglycemia. 6. Urinary tract infection. 7. Atrial fibrillation, on chronic Coumadin. 8. History of pulmonary hypertension. 9. Hypertension. 10.Hyperlipidemia.  PLAN: 1. We will admit her to a telemetry bed for her falling.  We will ask     PT and OT to evaluate her. 2. For her syncope, we will ask for 2-D echo and be a CT angio of her     brain and neck.  The patient is unable to have an MRI secondary to     her pacemaker. 3. For her dyspnea on exertion and chest pain, we will cycle her     cardiac enzymes, check a 2-D echo and we have asked cardiology to     see her as well as to evaluate her EKG changes. 4. For her severe headache, again we will check a CT angio, sed rate,     serum magnesium level.  She will be offered to her home medications     as well as tramadol and ibuprofen.  Neuro consult will likely  be     called if there is no improvement tomorrow. 5. For her hyperglycemia, she does not have the written diagnosis of     diabetes mellitus.  We will check hemoglobin A1c and place her on     sliding scale sensitive. 6. For urinary tract infection, we will culture her urine and start     her on Cipro. 7. For her other chronic comorbidities including her atrial     fibrillation, hypertension, hyperlipidemia, we will continue her     home medications and monitor her closely. 8. This patient is a full code.     Stephani Police, PA   ______________________________ Kathlen Mody, MD    MLY/MEDQ  D:  01/02/2011  T:  01/02/2011  Job:  [id number]  Electronically Signed by Algis Downs PA on 02/05/2011 09:32:47 AM Electronically Signed by Kathlen Mody MD on 02/07/2011 01:20:54 AM

## 2011-02-07 NOTE — Telephone Encounter (Signed)
RECV A CALL FROM THE ANSWERING SERVICE, HH RN COULD NOT GET THRU OUR PHONE LINES. WANTS TO SPEAK WITH MELINDA ABOUT THIS PATIENT. CALL BACK AT [phone]. MELINDA, DO YOU STILL HAVE THIS CHART?

## 2011-02-07 NOTE — Telephone Encounter (Signed)
Advised patient

## 2011-02-07 NOTE — Consult Note (Signed)
NAME:  [name] [name], [name] [name]                    ACCOUNT NO.:  [id number]  MEDICAL RECORD NO.:  [id number]           PATIENT TYPE:  I  LOCATION:  1501                         FACILITY:  [facility]  PHYSICIAN:  Diamond Martucci M. [place], M.D.  DATE OF BIRTH:  08-04-1936  DATE OF CONSULTATION:  01/02/2011 DATE OF DISCHARGE:                                CONSULTATION   PRIMARY CARDIOLOGIST:  Cassell Clement, MD  PRIMARY CARE PROVIDER:  Lyn Hollingshead, MD  REASON FOR CONSULTATION:  Syncope and atrial fibrillation.  PAST MEDICAL HISTORY: 1. Chronic atrial fibrillation on chronic Coumadin therapy. 2. 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 3. Pulmonary hypertension, severe. 4. Peripheral vascular disease, chronic claudication. 5. Vertigo. 6. Hypertrophic cardiomyopathy. 7. Chronic low back pain. 8. Depression/anxiety. 9. Hyperlipidemia. 10.Mitral regurgitation. 11.Status post total knee repair. 12.Status post appendectomy. 13.Status post low back surgery. 14.Status post tonsillectomy. 15.Status post partial hysterectomy.  HISTORY OF PRESENT ILLNESS:  This is a 75 year old Caucasian female with the above-stated problem list.  She was admitted for a syncopal episode and status post fall with hematoma.  The patient states she was in her usual state of health until approximately 3 weeks ago when she began to have severe headaches.  They were associated with some dizziness.  On the morning of admission, the patient states that she was getting out of bed and walking to the bathroom, she felt dizzy during this process. She made it to the bathroom, but not to the toilet and awoke in the floor after hitting her head on the bathtub.  The patient does not remember falling.  She states other than the dizziness walking to the bathroom, she had no other symptoms.  She denies any chest pain, flush feeling, or palpitations.  After awaking, the patient also denies  any postictal symptoms.  She was not confused, was alert and pressed her emergency alert buttoned and the EMS arrived.  In the emergency department, she was noted to have a large hematoma on her head and required 4 stitches.  CT of the head demonstrated left parietal scalp hematoma and laceration, but no acute intracranial abnormalities.  Her pacemaker has been interrogated since arrival and this showed chronic atrial fibrillation, but no sign of ventricular tachycardia or ventricular fibrillation.  The patient's EKG showed atrial fibrillation with a controlled rate.  She does have inferolateral T-wave inversion in the setting of no ischemic symptoms.  Upon evaluation, the patient is currently dizzy with sitting.  Of note, the patient states she has never had a syncopal episode before.  Again, she has been dizzy lately.  She does feel palpitations intermittently, but these have been controlled recently with an increase in her bisoprolol per Dr. Patty Sermons on December 04, 2010.  Of note, the patient does have a problem with dyspnea and history of pulmonary hypertension, her last heart catheterization was in 2010, by Dr. Gala Romney and he did not feel that she would benefit from pulmonary vasodilators.  She also is noted to have functional mitral regurgitation with chronically elevated left-sided  pressures.  Her most recent stress test was in September 2010, which was negative for reversible ischemia. The patient denies any increase in dyspnea on exertion, shortness of breath, orthopnea, PND, or edema.  The patient is able to complete activities around her home without difficulty.  SOCIAL HISTORY:  The patient lives in [place] by herself.  Her husband passed away last year.  She is a retired Lawyer. She has 2 female children.  She has a smoking history, but quit in 2007. She denies any alcohol or illicit drug use.  FAMILY HISTORY:  Noncontributory for early coronary artery  disease.  Her mother passed away at the age of 71 from lymphoma.  Her father died in an accident at the age of 38.  ALLERGIES: 1. PENICILLIN. 2. SULFA. 3. VERAPAMIL. 4. NITROGLYCERIN. 5. OXYCODONE. 6. ACETAMINOPHEN. 7. ASPIRIN.  HOME MEDICATIONS: 1. Coumadin 5 mg taking half tablet 3 days and full tablet 4 days a     weeks. 2. Micardis 40 mg daily. 3. Hydrochlorothiazide 25 mg daily. 4. Potassium 20 mEq daily. 5. Neurontin 300 mg 7 tablets daily. 6. Magnesium oxide 400 mg half tablet daily. 7. Lasix 40 mg 1 tablet daily as needed. 8. Ranitidine 20 mg daily. 9. Vicodin 10/325 four times a day. 10.Xanax 0.25 mg. 11.Simvastatin 2 mg daily. 12.Zoloft 50 mg daily. 13.Vitamin C daily. 14.Vitamin D daily. 15.B complex daily.  Of note, these are not verified home medications as the patient forgot her list, but does feel that the above is correct.  REVIEW OF SYSTEMS:  All pertinent positives and negatives as stated in HPI as well as urinary frequency.  All other systems have been reviewed and are negative.  PHYSICAL EXAMINATION:  VITAL SIGNS:  Temperature 98.6, pulse 70-79, respirations 18, blood pressure 125-165/41-79, O2 saturation 96% on 2 L. GENERAL:  This is a well-developed, elderly female.  She is in no acute distress. HEENT:  Normal with a bandage on the posterior aspect of her left side. NECK:  Supple without bruit or JVD. HEART:  Irregularly irregular with a 2/6 systolic ejection murmur. Pulses are 2+ and equal bilaterally. LUNGS:  Clear to auscultation bilaterally without wheezes, rales, or rhonchi. ABDOMEN:  Soft, nontender, positive bowel sounds x4. EXTREMITIES:  No clubbing, cyanosis, or edema. MUSCULOSKELETAL:  No joint deformities or effusions. NEUROLOGIC:  Alert and oriented x3, cranial nerves II through XII grossly intact.  CT of the head showing left parietal scalp hematoma and laceration without acute abnormalities.  CT of the spine showing no  fracture.  EKG showing atrial fibrillation at a controlled rate of 75 beats per minute. There are inferolateral T-wave inversions.  LABORATORY DATA:  WBC of 9.6, hemoglobin 14.4, hematocrit of 43.9, platelet of 169.  Sodium 135, potassium 3.5, chloride 98, bicarb 27, BUN 16, creatinine 0.8.  Point-of-care markers negative x1.  INR 2.44.  ASSESSMENT AND PLAN:  This is a 75 year old Caucasian female with a history of chronic atrial fibrillation, hypertrophic cardiomyopathy, and prior pacemaker implantation, who presents with syncope and posterior scalp laceration.  Pacemaker check was within normal limits, was demonstrating chronic atrial fibrillation.  The patient's blood pressure has been stable since admission.  CT of the head is negative for acute changes.  At this time, the patient is unable to sit up secondary to extreme dizziness.  Therefore, we are unable to obtain orthostatic. These will need to be retried in the morning to ensure the patient is not becoming hypotensive upon standing, as her bisoprolol was  increased approximately 3 weeks ago.  We do doubt that this is a cardiac event. Question if this is vasovagal.  We agree with checking a 2-D echocardiogram.  At this time, we will follow along with you.  Thank you for this consultation.     Leonette Monarch, PA-C   ______________________________ Vashti Bolanos M. [place], M.D.    NB/MEDQ  D:  01/03/2011  T:  01/03/2011  Job:  [id number]  cc:   Cassell Clement, M.D. Lyn Hollingshead, MD  Electronically Signed by Alen Blew P.A. on 02/04/2011 01:50:11 PM Electronically Signed by Nycere Presley [place] M.D. on 02/07/2011 01:05:23 PM

## 2011-02-07 NOTE — Discharge Summary (Signed)
NAME:  [name] [name], [name] [name]                    ACCOUNT NO.:  [id number]  MEDICAL RECORD NO.:  [id number]           PATIENT TYPE:  I  LOCATION:  1501                         FACILITY:  [facility]  PHYSICIAN:  Kela Millin, M.D.DATE OF BIRTH:  [birth year]  DATE OF ADMISSION:  01/02/2011 DATE OF DISCHARGE:  01/08/2011                        DISCHARGE SUMMARY - REFERRING   DISCHARGE DIAGNOSES: 1. Syncope, unclear etiology, workup so far negative. 2. Vertigo, benign, positional. 3. Atrial fibrillation, status post pacemaker in the past, on chronic     Coumadin. 4. Headaches. 5. History of transient ischemic attack. 6. Anxiety and depression. 7. Hypertension. 8. History of asthma. 9. History of gout. 10.History of pulmonary hypertension. 11.History of hyperlipidemia. 12.Gastroesophageal reflux disease. 13.History of hiatal hernia. 14.History of hemorrhoids. 15.History of irritable bowel syndrome. 16.Escherichia coli urinary tract infection, status post antibiotics. 17.History of arthritis/bursitis. 18.History of congestive heart failure. 19.History of urinary incontinence in the past. 20.Scalp laceration, staples removed January 08, 2011. 21.Left internal carotid artery stenosis:  50% to 70% per CT     angiogram.  PROCEDURES AND STUDIES: 1. CT scan of the head without contrast on 02/22:  Left parietal scalp     hematoma and laceration.  No acute intracranial abnormality. 2. CT scan of the neck:  Negative for fracture. 3. CT angiogram on 02/23:  No large or medium-vessel intracranial     arterial pathology. 4. CT scan of the neck:  Brachiocephalic vessel origins from the arch     not included on the scan.  There is potential for moderate stenosis     of the proximal left subclavian artery.  Atherosclerotic change at     the carotid bifurcation on the right.  Maximal narrowing of the ICA     only 20%.  More advanced atherosclerotic disease at the carotid     artery bifurcation on the  left.  70% diameter stenosis of the     proximal left ICA.  Atherosclerotic disease at the left vertebral     artery origin with stenosis estimated at 50% to 70%. 5. Doppler ultrasound of the carotids:  60% to 70% ICA stenosis. 6. 2-D echocardiogram on 02/23:  The ejection fraction is 60%.     Regional wall motion abnormalities cannot be excluded.  CONSULTATIONS: 1. Cardiology, Dr. Cassell Clement. 2. Neurology, Dr. Thana Farr.  BRIEF HISTORY:  The patient is a 75 year old white female with the above- listed medical problems who presented following a syncopal episode.  She reported that she had had headaches for about 3 weeks and she described them as occipital and then spreading to her entire head.  She stated that they were not associated with nausea, vomiting or photophobia.  She reported that in general, she would have the headaches in the morning and they would get better in the afternoon but then come back in the evening.  The patient also stated that for about 3 weeks, she had been "stumbling" and that she felt dizzy and occasionally had blurry vision. She reported that she did have a past history of vertigo and had been on Antivert in the  past.  The patient reported that on the day of admission, she went to the bathroom, did not remember falling in the bathroom.  She stated that when she woke up, she had a large hematoma on her head and required four staples that were placed in the ED on admission.  EMS was called and she pushed on her alert, she was brought to the ED.  The patient also complained of dyspnea with exertion and as stated that she had had it for a long time but it was worsening.  HOSPITAL COURSE: 1. Syncope with head laceration/hematoma, as discussed above:  Upon     admission, the patient had cardiac enzymes cycled and a 2-D     echocardiogram was done.  Cardiology was consulted and Dr.     Patty Sermons saw the patient and a pacemaker check was done and  found     to be within normal limits, demonstrating chronic atrial     fibrillation.  The 2-D echocardiogram revealed an ejection fraction     of 60% and it was noted that regional wall motion abnormalities     could not be excluded.  Dr. Patty Sermons indicated that he doubted     that this was a cardiac event and stated that it could possibly be     vasovagal.  In further interviewing the patient, she reported     vertigo and stated that she had had a history of the vertigo     before.  She was started on Antivert and her symptoms have     improved.  PT was also consulted and have followed the patient for     vestibular PT and recommended skilled nursing, and she is to go to     [facility] for further rehab.  Neurology was consulted given the     patient's history of headaches and further neuro workup included a     CT angiogram and the results, as stated above, revealing left 50 to     70% ICA stenosis.  The CT angiogram as well as CT scan was negative     for CVA.  Regarding the headaches, Dr. Thad Ranger indicated that all     malignant causes of her headaches had been ruled out; aneurysm,     stroke, trauma or vertebrobasilar insufficiency.  She also noted     that the patient was quite depressed and the patient was on Zoloft,     which was maintained during this hospital stay.  The patient's     headaches were controlled with NSAIDS but she stated that she was     unable to tolerate them due to GI upset even on Protonix and     Pepcid.  The NSAIDS were therefore discontinued after her headaches     had improved.  She was then put back on Vicodin which she had been     taking at home and she reported that this was controlling her     headaches.  In the ED, she had staples placed to the laceration she     had sustained following the fall and the ED physician recommended     further staples to be removed today prior to discharge.  As already     mentioned above, she has improved  clinically.  She will be     discharged to a rehab facility as recommended per PT and is to     follow up with her outpatient doctors. 2. Left  ICA stenosis, 50% to 70% per CT angio:  The patient reported     that she does have a prior history of carotid artery disease and     was followed by vascular outpatient.  The patient was maintained on     her Coumadin and is to continue this upon discharge. 3. E coli urinary tract infection:  The patient's urinalysis on     admission was consistent with a UTI.  Urine cultures were done and     she was started on empiric antibiotics.  The urine cultures grew E     coli which was sensitive to the antibiotics she was on; she has     completed a full course of antibiotics and will not require any     further antibiotics upon discharge.  She has remained afebrile with     no leukocytosis. 4. Left parietal scalp hematoma and laceration, as above:  Staples     were placed by ED physician in the ED and they will be removed     today, 01/08/2011, prior to the patient's discharge. 5. Hypokalemia:  Her potassium was replaced in the hospital. 6. Vertigo:  As discussed above, the patient was placed on Antivert,     PT was also consulted and she received vestibular PT during this     hospital stay and is to continue with vestibular PT upon discharge. 7. Atrial fibrillation:  Cardiology was consulted and her pacemaker     was checked and reported to be within normal limits.  The patient     remained rate controlled on Tiazac and cardiology followed and     changed her bisoprolol to 10 mg in the a.m. and 5 mg at bedtime.     and she is to continue this upon discharge. 8. Depression, anxiety:  The patient was maintained on her Zoloft     during this hospital stay and is to continue this upon discharge.  Her other chronic medical conditions remained stable during this hospital stay and she is to continue her outpatient medications except as indicated  above.  DISCHARGE MEDICATIONS: 1. Vicodin 1 to 2 tablets q.6 h p.r.n. 2. Meclizine 25 mg p.o. t.i.d., hold for sedation. 3. Protonix 40 mg p.o. q.a.m. 4. Bisoprolol 10 mg 1 tablet q.a.m. and half tablet at bedtime. 5. Coumadin 5 mg given today prior to discharge and the patient also     given Lovenox therapeutic dose x1 today.  Her PT/INR is to be done     in the a.m. and further Coumadin per nursing home MD. 6. Alprazolam 0.25 mg 1 t.i.d. p.r.n. 7. Ascorbic acid 500 mg daily. 8. Diltiazem 120 mg p.o. daily. 9. Pepcid 20 mg p.o. at bedtime. 10.Hydrochlorothiazide 25 mg p.o. at bedtime. 11.Lasix 40 mg p.o. at bedtime as previously. 12.Magnesium oxide 400 mg half a tablet p.o. daily. 13.Micardis 40 mg 1 tablet p.o. at bedtime. 14.Neurontin 300 mg 1 capsule in the a.m., 2 capsules at noon, 1 at     dinner, and 2 at bedtime. 15.Potassium chloride 20 mEq 1 p.o. daily. 16.Vitamin D3 1000 units p.o. daily. 17.Zocor 10 mg p.o. at bedtime. 18.Zoloft 10 mg p.o. b.i.d.  FOLLOWUP CARE: 1. Dr. Graciela Husbands next week as scheduled. 2. Dr. Patty Sermons in 1 to 2 weeks as scheduled. 3. Nursing home physician in 1 to 2 days.  As discussed above, she is     to have a PT/INR done in the a.m.; her  PT/INR today prior to discharge is 1.60, and she received Coumadin     5 mg and Lovenox one-time dose.  She is to have a PT/INR done in     the a.m. and further dosing per nursing home MD.  DISCHARGE CONDITION:  Improved/stable.     Kela Millin, M.D.     ACV/MEDQ  D:  01/08/2011  T:  01/08/2011  Job:  [id number]  cc:   Cassell Clement, M.D. Fax: [phone]  Doran Durand, MD  Duke Salvia, MD, [facility] [address]  [address] [place] [address]  Electronically Signed by Donnalee Curry M.D. on 02/07/2011 10:30:17 AM

## 2011-02-07 NOTE — Telephone Encounter (Signed)
Tell the patient to leave off her Coumadin for the next 3 days

## 2011-02-07 NOTE — Telephone Encounter (Signed)
Physical therapist called and blood pressure has been low 96/58 and patient is feeling lightheaded.  Patient also called back stating blood pressure had been low and Dr. Clovis Riley had stopped her HCTZ.  Dr. Clovis Riley also wanted for her to hold her coumadin secondary to head bleeding.  Patient was hospitalized for a fall and wounded head.  Per patient Dr. Clovis Riley seems to think the wound is not heeling because of the coumadin.  Patient says her head bleeds every night during her sleep.  INR on 2/27 was 2.0.  Patient also has been feeling a little "out of it" and lightheaded.  Please advise

## 2011-02-08 ENCOUNTER — Telehealth: Payer: Self-pay | Admitting: *Deleted

## 2011-02-08 NOTE — Telephone Encounter (Signed)
Physical therapist called stating blood pressure low again today even without HCTZ.  Blood pressure at visit 88/50.  Will decrease bisoprolol from 10 mg po am and 1/2 po pm to just 1/2 po qam (5mg  am).  Advised to call back if systolic under 100.

## 2011-02-08 NOTE — Telephone Encounter (Signed)
agree

## 2011-02-11 LAB — PROTIME-INR: INR: 1.4 — AB (ref 0.9–1.1)

## 2011-02-14 ENCOUNTER — Ambulatory Visit (INDEPENDENT_AMBULATORY_CARE_PROVIDER_SITE_OTHER): Payer: Self-pay | Admitting: *Deleted

## 2011-02-14 DIAGNOSIS — I4891 Unspecified atrial fibrillation: Secondary | ICD-10-CM

## 2011-02-14 LAB — POCT I-STAT 3, ART BLOOD GAS (G3+)
Bicarbonate: 22.6 mEq/L (ref 20.0–24.0)
pCO2 arterial: 45.6 mmHg — ABNORMAL HIGH (ref 35.0–45.0)
pH, Arterial: 7.303 — ABNORMAL LOW (ref 7.350–7.400)
pO2, Arterial: 76 mmHg — ABNORMAL LOW (ref 80.0–100.0)

## 2011-02-14 LAB — POCT I-STAT 3, VENOUS BLOOD GAS (G3P V)
Acid-Base Excess: 5 mmol/L — ABNORMAL HIGH (ref 0.0–2.0)
Bicarbonate: 27.3 mEq/L — ABNORMAL HIGH (ref 20.0–24.0)
O2 Saturation: 58 %
O2 Saturation: 63 %
TCO2: 29 mmol/L (ref 0–100)
pO2, Ven: 32 mmHg (ref 30.0–45.0)
pO2, Ven: 36 mmHg (ref 30.0–45.0)

## 2011-02-19 ENCOUNTER — Telehealth: Payer: Self-pay | Admitting: *Deleted

## 2011-02-19 NOTE — Telephone Encounter (Signed)
Pt wanting a referral to see a neurologist.  Pt states her head is not feeling well since her fall.  Per Dr. Patty Sermons:  He recom. Her to see Dr. Clovis Riley to let him refer her to a neurologist.  Pt notified

## 2011-02-26 ENCOUNTER — Encounter: Payer: Self-pay | Admitting: Internal Medicine

## 2011-02-26 ENCOUNTER — Ambulatory Visit (INDEPENDENT_AMBULATORY_CARE_PROVIDER_SITE_OTHER): Payer: Medicare Other | Admitting: Internal Medicine

## 2011-02-26 DIAGNOSIS — Z950 Presence of cardiac pacemaker: Secondary | ICD-10-CM

## 2011-02-26 DIAGNOSIS — I4891 Unspecified atrial fibrillation: Secondary | ICD-10-CM

## 2011-02-26 DIAGNOSIS — I495 Sick sinus syndrome: Secondary | ICD-10-CM

## 2011-02-26 NOTE — Patient Instructions (Addendum)
Your physician recommends that you schedule a follow-up appointment in: YEAR WITH DR KLEIN  Your physician recommends that you continue on your current medications as directed. Please refer to the Current Medication list given to you today. 

## 2011-02-26 NOTE — Assessment & Plan Note (Signed)
The patient's device was interrogated.  The information was reviewed. No changes were made in the programming.   A longevity is less than one year

## 2011-02-26 NOTE — Progress Notes (Signed)
HPI: [name] [name] is a 75 y.o. female  Seen to establish pacemaker followup. She initially underwent device implantation in 1998. She underwent generator replacement in 2006 receiving a Medtronic and rhythm device. The initially implanted leads were left in place. She now has permanent atrial fibrillation.  She is on warfarin. He has a history of a prior TIA.  Her exercise tolerance is quite limited. She is a prior smoker and was just recently seen by the pulmonary physicians who felt that she had COPD.  She denies chest pain or peripheral edema. There has been no syncope or recent palpitations. Current Outpatient Prescriptions  Medication Sig Dispense Refill  . ALPRAZolam (XANAX) 0.25 MG tablet Take 0.25 mg by mouth at bedtime as needed.        . Ascorbic Acid (VITAMIN C) 500 MG tablet Take 500 mg by mouth daily.        . bisoprolol (ZEBETA) 10 MG tablet Take 10 mg by mouth 2 (two) times daily.        . calcium carbonate (OS-CAL) 600 MG TABS Take 600 mg by mouth daily.        . calcium carbonate (TUMS - DOSED IN MG ELEMENTAL CALCIUM) 500 MG chewable tablet Chew 1 tablet by mouth daily.        . Cholecalciferol (VITAMIN D) 2000 UNITS CAPS Take 1 capsule by mouth daily.        . clonazePAM (KLONOPIN) 0.5 MG tablet Take 0.5 mg by mouth 2 (two) times daily as needed.        . [employer] SALINE NASAL SPRAY NA by Nasal route. UAD       . Cyanocobalamin (VITAMIN B-12) 2500 MCG SUBL Place 1 tablet under the tongue daily.        [name] diltiazem (CARDIZEM) 120 MG tablet Take 120 mg by mouth daily.        . famotidine (PEPCID) 20 MG tablet Take 20 mg by mouth 2 (two) times daily.        . furosemide (LASIX) 40 MG tablet Take 40 mg by mouth daily.        [name] gabapentin (NEURONTIN) 300 MG capsule Take 300 mg by mouth. 1 in the am, 2 at lunch, 2 dinner and 2 at bedtime       . hydrochlorothiazide 25 MG tablet Take 25 mg by mouth daily.        [name] HYDROcodone-acetaminophen (NORCO) 10-325 MG per tablet Take 1 tablet by  mouth every 6 (six) hours as needed.        . magnesium oxide (MAG-OX) 400 MG tablet Take 400 mg by mouth daily.        . potassium chloride SA (K-DUR,KLOR-CON) 20 MEQ tablet Take 20 mEq by mouth daily.        . sertraline (ZOLOFT) 100 MG tablet Take 100 mg by mouth daily.        . simvastatin (ZOCOR) 10 MG tablet Take 10 mg by mouth at bedtime.        [name] telmisartan (MICARDIS) 40 MG tablet Take 40 mg by mouth daily.        [name] warfarin (COUMADIN) 5 MG tablet Take 5 mg by mouth daily. UAD         Allergies  Allergen Reactions  . Aspirin   . Penicillins     REACTION: rash    No past medical history on file.  No past surgical history on file.  No family history on file.  History   Social History  . Marital Status: Widowed    Spouse Name: N/A    Number of Children: N/A  . Years of Education: N/A   Occupational History  . Not on file.   Social History Main Topics  . Smoking status: Former Smoker    Types: Cigarettes    Quit date: 11/15/2005  . Smokeless tobacco: Not on file  . Alcohol Use: Not on file  . Drug Use: Not on file  . Sexually Active: Not on file   Other Topics Concern  . Not on file   Social History Narrative  . No narrative on file    A 10 point review of systems was negative apart from effort associated weight loss, some depression since the death of her husband.  PHYSICAL EXAMINATION  Blood pressure 124/58, pulse 64, height 5\' 4"  (1.626 m), weight 146 lb (66.225 kg).   Well developed and nourishedCaucasian female appearing her stated age  in no acute distress HENT Has a large eschar over the left occiput. Apparently a smaller Than what it was It was about the size of a quarter and about 1/4 inch in thickness. Neck supple with JVP-flat Pacemaker pocket was well-healed Carotids brisk and full without bruits Back without scoliosis Mild kyphosisClear Irregulary irregular  rate and rhythm, no murmurs or gallops Abd-soft with active BS without  hepatomegaly or midline pulsation Femoral pulses 2+ distal pulses intact No Clubbing cyanosis edema Skin-warm and dry LN-neg submandibular and supraclavicular A & Oriented CN 3-12 normal  Grossly normal sensory and motor function Affect engaging .

## 2011-02-26 NOTE — Assessment & Plan Note (Signed)
permantent on coumadin

## 2011-02-27 ENCOUNTER — Telehealth: Payer: Self-pay | Admitting: Family Medicine

## 2011-02-27 NOTE — Telephone Encounter (Signed)
Has question regarding dosage of her meds. Pls.call her back

## 2011-02-27 NOTE — Telephone Encounter (Signed)
Wanted to clarify bisoprolol dose at 5 mg daily

## 2011-02-28 ENCOUNTER — Ambulatory Visit (INDEPENDENT_AMBULATORY_CARE_PROVIDER_SITE_OTHER): Payer: Medicare Other | Admitting: *Deleted

## 2011-02-28 DIAGNOSIS — Z7901 Long term (current) use of anticoagulants: Secondary | ICD-10-CM

## 2011-02-28 NOTE — Patient Instructions (Signed)
Patient advised of decrease coumadin and information faxed to [facility].

## 2011-03-07 ENCOUNTER — Ambulatory Visit: Payer: Self-pay | Admitting: *Deleted

## 2011-03-07 ENCOUNTER — Other Ambulatory Visit: Payer: Self-pay | Admitting: *Deleted

## 2011-03-07 DIAGNOSIS — I4891 Unspecified atrial fibrillation: Secondary | ICD-10-CM

## 2011-03-07 NOTE — Patient Instructions (Signed)
Advised patient to continue same dose of medications

## 2011-03-08 ENCOUNTER — Encounter: Payer: Self-pay | Admitting: *Deleted

## 2011-03-08 DIAGNOSIS — I422 Other hypertrophic cardiomyopathy: Secondary | ICD-10-CM | POA: Insufficient documentation

## 2011-03-08 DIAGNOSIS — M1990 Unspecified osteoarthritis, unspecified site: Secondary | ICD-10-CM | POA: Insufficient documentation

## 2011-03-08 DIAGNOSIS — F418 Other specified anxiety disorders: Secondary | ICD-10-CM | POA: Insufficient documentation

## 2011-03-13 ENCOUNTER — Ambulatory Visit (INDEPENDENT_AMBULATORY_CARE_PROVIDER_SITE_OTHER): Payer: Medicare Other | Admitting: *Deleted

## 2011-03-13 ENCOUNTER — Encounter: Payer: Self-pay | Admitting: Cardiology

## 2011-03-13 ENCOUNTER — Ambulatory Visit (INDEPENDENT_AMBULATORY_CARE_PROVIDER_SITE_OTHER): Payer: Medicare Other | Admitting: Cardiology

## 2011-03-13 VITALS — BP 110/60 | HR 70 | Wt 147.0 lb

## 2011-03-13 DIAGNOSIS — I059 Rheumatic mitral valve disease, unspecified: Secondary | ICD-10-CM

## 2011-03-13 DIAGNOSIS — I4891 Unspecified atrial fibrillation: Secondary | ICD-10-CM

## 2011-03-13 DIAGNOSIS — I340 Nonrheumatic mitral (valve) insufficiency: Secondary | ICD-10-CM | POA: Insufficient documentation

## 2011-03-13 DIAGNOSIS — I10 Essential (primary) hypertension: Secondary | ICD-10-CM

## 2011-03-13 DIAGNOSIS — F329 Major depressive disorder, single episode, unspecified: Secondary | ICD-10-CM

## 2011-03-13 DIAGNOSIS — I119 Hypertensive heart disease without heart failure: Secondary | ICD-10-CM

## 2011-03-13 MED ORDER — LOSARTAN POTASSIUM 100 MG PO TABS: 100.0000 mg | ORAL_TABLET | Freq: Every day | ORAL | Status: DC

## 2011-03-13 NOTE — Progress Notes (Signed)
[name] [name] Date of Birth:  08-14-1936 [facility] [address]   [address] [place], [place]  [address] [phone]           Fax   [phone]  History of Present Illness: This 75 year old woman has a complex past medical history.  She has a history of hypertrophic cardiomyopathy and a history of chronic atrial fibrillation.  She is on long-term Coumadin.  She has a permanent pacemaker followed by Dr. Graciela Husbands.  She's had a history of pulmonary hypertension and she had a right heart catheterization on 08/28/09 by Dr. Gala Romney who felt that she would not benefit from pulmonary vasodilators therapy.  He has had some element of functional mitral regurgitation with chronically elevated left-sided pressures.  We used echocardiography on 09/13/09 to evaluate her mitral regurgitation at rest and with exercise.  She had moderate mitral regurgitation at rest and it did not change significantly after treadmill exercise.  The patient has not been expressing any chest pain or angina.  She had a normal nuclear stress test on 08/02/09 with an ejection fraction 59% and left bundle-branch blockFrom her paced rhythm and no evidence of ischemia.  Current Outpatient Prescriptions  Medication Sig Dispense Refill  . ALPRAZolam (XANAX) 0.25 MG tablet Take 0.25 mg by mouth at bedtime as needed.        . Ascorbic Acid (VITAMIN C) 500 MG tablet Take 500 mg by mouth daily.        . bisoprolol (ZEBETA) 10 MG tablet Take 5 mg by mouth daily.       . calcium carbonate (OS-CAL) 600 MG TABS Take 600 mg by mouth daily.        . calcium carbonate (TUMS - DOSED IN MG ELEMENTAL CALCIUM) 500 MG chewable tablet Chew 1 tablet by mouth daily.        . Cholecalciferol (VITAMIN D) 2000 UNITS CAPS Take 1 capsule by mouth daily.        . clonazePAM (KLONOPIN) 0.5 MG tablet Take 0.5 mg by mouth 2 (two) times daily as needed.        . CVS SALINE NASAL SPRAY NA by Nasal route. UAD       . Cyanocobalamin (VITAMIN  B-12) 2500 MCG SUBL Place 1 tablet under the tongue daily.        [name] diltiazem (CARDIZEM) 120 MG tablet Take 120 mg by mouth daily.        . famotidine (PEPCID) 20 MG tablet Take 20 mg by mouth 2 (two) times daily.        . furosemide (LASIX) 40 MG tablet Take 40 mg by mouth daily. Taking prn      . gabapentin (NEURONTIN) 300 MG capsule Take 300 mg by mouth. 1 in the am, 2 at lunch, 2 dinner and 2 at bedtime       . HYDROcodone-acetaminophen (NORCO) 10-325 MG per tablet Take 1 tablet by mouth every 6 (six) hours as needed.        . magnesium oxide (MAG-OX) 400 MG tablet Take 200 mg by mouth daily.       . potassium chloride SA (K-DUR,KLOR-CON) 20 MEQ tablet Take 20 mEq by mouth daily. Taking prn      . sertraline (ZOLOFT) 100 MG tablet Take 100 mg by mouth 2 (two) times daily.       . simvastatin (ZOCOR) 10 MG tablet Take 10 mg by mouth at bedtime.        [name]  triamcinolone (KENALOG) 0.1 % ointment Apply topically 2 (two) times daily.        [name] warfarin (COUMADIN) 5 MG tablet Take 5 mg by mouth daily. UAD       . DISCONTD: telmisartan (MICARDIS) 40 MG tablet Take 40 mg by mouth daily.        . hydrochlorothiazide 25 MG tablet Take 25 mg by mouth daily.        [name] losartan (COZAAR) 100 MG tablet Take 1 tablet (100 mg total) by mouth daily.  30 tablet  11    Allergies  Allergen Reactions  . Aspirin   . Calan (Verapamil Hcl)   . Crestor (Rosuvastatin Calcium)     Muscle pain  . Lexapro   . Lipitor (Atorvastatin Calcium)     myalgias  . Lopressor (Metoprolol Tartrate)   . Norvasc (Amlodipine Besylate)     fatigue  . Penicillins     REACTION: rash    Patient Active Problem List  Diagnoses  . HYPERLIPIDEMIA  . DEPRESSIVE DISORDER NOT ELSEWHERE CLASSIFIED  . HYPERTENSION  . ALLERGIC RHINITIS  . CHRONIC OBSTRUCTIVE PULMONARY DISEASE, MODERATE  . SHORTNESS OF BREATH  . COUGH  . Atrial fibrillation  . Pacemaker  . Hypertrophic cardiomyopathy  . Osteoarthritis  . Situational mixed anxiety  and depressive disorder  . Hypercholesterolemia  . Mitral regurgitation    History  Smoking status  . Former Smoker  . Types: Cigarettes  . Quit date: 11/15/2005  Smokeless tobacco  . Not on file    History  Alcohol Use: Not on file    Family History  Problem Relation Age of Onset  . Other Father   . Cancer Mother     Review of Systems: Constitutional: no fever chills diaphoresis or fatigue or change in weight.  Head and neck: no hearing loss, no epistaxis, no photophobia or visual disturbance. Respiratory: No cough, shortness of breath or wheezing. Cardiovascular: No chest pain peripheral edema, palpitations. Gastrointestinal: No abdominal distention, no abdominal pain, no change in bowel habits hematochezia or melena. Genitourinary: No dysuria, no frequency, no urgency, no nocturia. Musculoskeletal:No arthralgias, no back pain, no gait disturbance or myalgias. Neurological: No dizziness, no headaches, no numbness, no seizures, no syncope, no weakness, no tremors. Hematologic: No lymphadenopathy, no easy bruising. Psychiatric: No confusion, no hallucinations, no sleep disturbance.    Physical Exam: Filed Vitals:   03/13/11 1102  BP: 110/60  Pulse: 70  The general appearance reveals a well-developed well-nourished woman in no distress.Pupils equal and reactive.   Extraocular Movements are full.  There is no scleral icterus.  The mouth and pharynx are normal.  The neck is supple.  The carotids reveal no bruits.  The jugular venous pressure is normal.  The thyroid is not enlarged.  There is no lymphadenopathy.  The previous laceration on her scalp is well-healed.The chest is clear to percussion and auscultation. There are no rales or rhonchi. Expansion of the chest is symmetrical.The precordium is quiet.  The first heart sound is normal.  The second heart sound is physiologically split.  There is no  gallop rub or click.There is a grade 2/6 apical murmur of mitral  regurgitation.  There is no abnormal lift or heave.The abdomen is soft and nontender. Bowel sounds are normal. The liver and spleen are not enlarged. There Are no abdominal masses. There are no bruits.Normal extremity with palpable pedal pulses and no significant pedal edema.  She does have multiple dilated varicose veins.Strength is normal and  symmetrical in all extremities.  There is no lateralizing weakness.  There are no sensory deficits.The skin is warm and dry.  There is no rash.No muscle weakness or atrophy.  No muscle tenderness.   Assessment / Plan: Her INR is therapeutic at 2.6 and she'll continue same Coumadin.  She will call her vascular surgeon Dr. Arbie Cookey concerning her varicose veins.  She'll return in one month for protime and in 2 months for a protime in 3 months for office visit and protime.

## 2011-03-13 NOTE — Assessment & Plan Note (Signed)
It was a year ago yesterday that her husband died.  She is still having a lot of problems with situational depression and anxiety.  Overall however she is doing better than she was several months ago in that regard.

## 2011-03-13 NOTE — Assessment & Plan Note (Signed)
The patient has a past history of essential hypertension.  In February she lost her balance and fell into her shower and struck her head causing a severe laceration.  She had to be hospitalized and subsequently went to a skilled nursing facility.  She is now back home.  She is still having spells of dizziness.  She has had vestibular physical therapy which has helped decrease the degree of dizziness that she is experiencing.  She is still having some headaches as well.  She does have an appointment to see her neurologist Dr. Sandria Manly in June.

## 2011-03-21 ENCOUNTER — Ambulatory Visit
Admission: RE | Admit: 2011-03-21 | Discharge: 2011-03-21 | Disposition: A | Discharge status: Home / Self Care | Payer: Medicare Other | Source: Ambulatory Visit | Attending: Physician Assistant | Admitting: Physician Assistant

## 2011-03-21 ENCOUNTER — Telehealth: Payer: Self-pay | Admitting: Cardiology

## 2011-03-21 ENCOUNTER — Other Ambulatory Visit: Payer: Self-pay | Admitting: Physician Assistant

## 2011-03-21 DIAGNOSIS — M542 Cervicalgia: Secondary | ICD-10-CM

## 2011-03-21 NOTE — Telephone Encounter (Signed)
Heart has been in AF really acting up for a week.

## 2011-03-22 ENCOUNTER — Ambulatory Visit
Admission: RE | Admit: 2011-03-22 | Discharge: 2011-03-22 | Disposition: A | Discharge status: Home / Self Care | Payer: Medicare Other | Source: Ambulatory Visit | Attending: Nurse Practitioner | Admitting: Nurse Practitioner

## 2011-03-22 ENCOUNTER — Encounter: Payer: Self-pay | Admitting: Nurse Practitioner

## 2011-03-22 ENCOUNTER — Ambulatory Visit (INDEPENDENT_AMBULATORY_CARE_PROVIDER_SITE_OTHER): Payer: Medicare Other | Admitting: Nurse Practitioner

## 2011-03-22 VITALS — BP 128/78 | HR 68 | Wt 149.0 lb

## 2011-03-22 DIAGNOSIS — R51 Headache: Secondary | ICD-10-CM

## 2011-03-22 DIAGNOSIS — R0602 Shortness of breath: Secondary | ICD-10-CM

## 2011-03-22 DIAGNOSIS — R5383 Other fatigue: Secondary | ICD-10-CM

## 2011-03-22 DIAGNOSIS — I4891 Unspecified atrial fibrillation: Secondary | ICD-10-CM

## 2011-03-22 DIAGNOSIS — R0609 Other forms of dyspnea: Secondary | ICD-10-CM

## 2011-03-22 DIAGNOSIS — R0989 Other specified symptoms and signs involving the circulatory and respiratory systems: Secondary | ICD-10-CM

## 2011-03-22 DIAGNOSIS — W19XXXA Unspecified fall, initial encounter: Secondary | ICD-10-CM | POA: Insufficient documentation

## 2011-03-22 DIAGNOSIS — R0600 Dyspnea, unspecified: Secondary | ICD-10-CM

## 2011-03-22 DIAGNOSIS — F4323 Adjustment disorder with mixed anxiety and depressed mood: Secondary | ICD-10-CM

## 2011-03-22 DIAGNOSIS — R5381 Other malaise: Secondary | ICD-10-CM

## 2011-03-22 LAB — BRAIN NATRIURETIC PEPTIDE: Pro B Natriuretic peptide (BNP): 233 pg/mL — ABNORMAL HIGH (ref 0.0–100.0)

## 2011-03-22 LAB — BASIC METABOLIC PANEL
BUN: 13 mg/dL (ref 6–23)
CO2: 31 mEq/L (ref 19–32)
Calcium: 9.8 mg/dL (ref 8.4–10.5)
Chloride: 100 mEq/L (ref 96–112)
Creatinine, Ser: 0.9 mg/dL (ref 0.4–1.2)
GFR: 69.73 mL/min (ref >60.00)
Glucose, Bld: 158 mg/dL — ABNORMAL HIGH (ref 70–99)
Potassium: 3.4 mEq/L — ABNORMAL LOW (ref 3.5–5.1)
Sodium: 140 mEq/L (ref 135–145)

## 2011-03-22 MED ORDER — BISOPROLOL FUMARATE 10 MG PO TABS: 10.0000 mg | ORAL_TABLET | Freq: Every day | ORAL | Status: DC

## 2011-03-22 NOTE — Progress Notes (Signed)
Advised patient

## 2011-03-22 NOTE — Progress Notes (Signed)
Left message

## 2011-03-22 NOTE — Assessment & Plan Note (Signed)
She seems very depressed to me. I encouraged her if her studies look good to think about going back into counseling.

## 2011-03-22 NOTE — Assessment & Plan Note (Signed)
We will send her for a CXR and check labs today to include BMET and BNP.

## 2011-03-22 NOTE — Assessment & Plan Note (Signed)
She is in chronic atrial fib. I think her rate is controlled. She was having some PVCs. She is only taking 1/2 of her Zebeta. We will try her on a whole tablet to see if this will help.

## 2011-03-22 NOTE — Progress Notes (Signed)
[name] [name] Date of Birth: [birth year]   History of Present Illness: [name] [name] is seen today for a work in visit. She is seen for Dr. Patty Sermons. She was just here on the 2nd and had a good visit. For the past week she has noticed more of her atrial fib "acting up". She is more short of breath. She does not have any swelling. She has a little cough. She is worried about recurrent heart failure. She is also more lethargic, having headaches and nauseated. She did fall in the shower back in February. She had to go back to the nursing home. She had therapy for vestibular issues. She remains on coumadin. She remains very depressed. She was not able to complete her grief counseling because of the fall.   Current Outpatient Prescriptions on File Prior to Visit  Medication Sig Dispense Refill  . ALPRAZolam (XANAX) 0.25 MG tablet Take 0.25 mg by mouth at bedtime as needed.        . Ascorbic Acid (VITAMIN C) 500 MG tablet Take 500 mg by mouth daily.        . bisoprolol (ZEBETA) 10 MG tablet Take 10 mg by mouth daily. 1/2 tab daily       . calcium carbonate (OS-CAL) 600 MG TABS Take 600 mg by mouth daily.        . calcium carbonate (TUMS - DOSED IN MG ELEMENTAL CALCIUM) 500 MG chewable tablet Chew 1 tablet by mouth daily.        . Cholecalciferol (VITAMIN D) 2000 UNITS CAPS Take 1 capsule by mouth daily.        . clonazePAM (KLONOPIN) 0.5 MG tablet Take 0.5 mg by mouth 2 (two) times daily as needed.        . [employer] SALINE NASAL SPRAY NA by Nasal route. UAD       . Cyanocobalamin (VITAMIN B-12) 2500 MCG SUBL Place 1 tablet under the tongue daily.        [name] diltiazem (CARDIZEM) 120 MG tablet Take 120 mg by mouth daily.        . famotidine (PEPCID) 20 MG tablet Take 20 mg by mouth 2 (two) times daily.        . furosemide (LASIX) 40 MG tablet Take 40 mg by mouth daily. Taking prn      . gabapentin (NEURONTIN) 300 MG capsule Take 300 mg by mouth. 1 in the am, 2 at lunch, 2 dinner and 2 at bedtime       .  hydrochlorothiazide 25 MG tablet Take 25 mg by mouth daily.        [name] HYDROcodone-acetaminophen (NORCO) 10-325 MG per tablet Take 1 tablet by mouth every 6 (six) hours as needed.        . magnesium oxide (MAG-OX) 400 MG tablet Take 200 mg by mouth daily.       . potassium chloride SA (K-DUR,KLOR-CON) 20 MEQ tablet Take 20 mEq by mouth daily. Taking prn      . sertraline (ZOLOFT) 100 MG tablet Take 100 mg by mouth 2 (two) times daily.       . simvastatin (ZOCOR) 10 MG tablet Take 10 mg by mouth at bedtime.        [name] telmisartan (MICARDIS) 40 MG tablet Take 40 mg by mouth daily.        [name] triamcinolone (KENALOG) 0.1 % ointment Apply topically 2 (two) times daily.        [name] warfarin (COUMADIN) 5 MG tablet  Take 5 mg by mouth daily. UAD       . losartan (COZAAR) 100 MG tablet Take 1 tablet (100 mg total) by mouth daily.  30 tablet  11    Allergies  Allergen Reactions  . Aspirin   . Calan (Verapamil Hcl)   . Crestor (Rosuvastatin Calcium)     Muscle pain  . Lexapro   . Lipitor (Atorvastatin Calcium)     myalgias  . Lopressor (Metoprolol Tartrate)   . Norvasc (Amlodipine Besylate)     fatigue  . Penicillins     REACTION: rash    Past Medical History  Diagnosis Date  . Hypertrophic cardiomyopathy   . Hypertension   . Osteoarthritis   . Situational mixed anxiety and depressive disorder   . Hypercholesterolemia   . Atrial fibrillation   . Pacemaker     Past Surgical History  Procedure Date  . Knee arthroplasty 2011    right knee  . Tonsillectomy   . Appendectomy   . Cardiac catheterization   . Insert / replace / remove pacemaker   . Other surgical history     hysterectomy    History  Smoking status  . Former Smoker  . Types: Cigarettes  . Quit date: 11/15/2005  Smokeless tobacco  . Never Used    History  Alcohol Use No    Family History  Problem Relation Age of Onset  . Other Father   . Cancer Mother     Review of Systems: The review of systems is positive for  depression, dyspnea, cough, headache, fatigue and lethargy. She is having more palpitations.  She had her pacemaker checked in April with Dr. Graciela Husbands. She has less than one year of battery life left.  All other systems were reviewed and are negative.  Physical Exam: BP 128/78  Pulse 68  Wt 149 lb (67.586 kg) Patient is in no acute distress. She seems depressed to me. Skin is warm and dry. Color is normal.  HEENT is unremarkable. Normocephalic/atraumatic. PERRL. Sclera are nonicteric. Neck is supple. No masses. No JVD. Lungs are clear. Cardiac exam shows an irregular rhythm. She has a soft murmur.  Abdomen is soft. Extremities are without any edema. Gait and ROM are intact. No gross neurologic deficits are noted.  LABORATORY DATA: INR on May 2nd was 2.6. Her EKG today shows atrial fib. Her rate is controlled. She does have occasional PVC's.   Assessment / Plan:

## 2011-03-22 NOTE — Assessment & Plan Note (Signed)
She says she is having more headaches, nausea and sleeping too much. I think we need to repeat her CT of the head to just make sure nothing else is going on. She does remain on coumadin.

## 2011-03-22 NOTE — Patient Instructions (Signed)
We are going to send you for some lab work, CXR and CT head today. I want you to take a full tablet of your Zebeta.  Further disposition to follow after your studies are complete.

## 2011-03-25 ENCOUNTER — Other Ambulatory Visit: Payer: Self-pay | Admitting: *Deleted

## 2011-03-25 NOTE — Progress Notes (Signed)
Advised patient

## 2011-03-25 NOTE — Progress Notes (Signed)
Patient stated she is only taking her potassium when she takes her lasix, which is not very often.  Advised to increase potassium to daily.  Recheck before June?  Also states sometimes she aches all over and is wondering if it is coming from her Zocor?  Patient states she is also very depressed even though she is taking medications.  Advised to contact her PCP/counselor.  Also blood sugars elevated, advised to contact PCP.

## 2011-03-26 ENCOUNTER — Telehealth: Payer: Self-pay | Admitting: Cardiology

## 2011-03-26 DIAGNOSIS — E876 Hypokalemia: Secondary | ICD-10-CM

## 2011-03-26 MED ORDER — POTASSIUM CHLORIDE CRYS ER 20 MEQ PO TBCR: 20.0000 meq | EXTENDED_RELEASE_TABLET | Freq: Every day | ORAL | Status: DC

## 2011-03-26 NOTE — H&P (Signed)
NAME:  [name] [name], [name] [name]                    ACCOUNT NO.:  [id number]   MEDICAL RECORD NO.:  [id number]          PATIENT TYPE:  INP   LOCATION:  1828                         FACILITY:  [facility]   PHYSICIAN:  Corky Crafts, MDDATE OF BIRTH:  [birth year]   DATE OF ADMISSION:  10/18/2007  DATE OF DISCHARGE:                              HISTORY & PHYSICAL   PRIMARY MEDICAL DOCTOR:  Dr. Lupe Carney   REASON FOR HOSPITALIZATION:  1. Syncope.  2. Atrial fibrillation.  3. Prior pacemaker placement.   HISTORY OF PRESENT ILLNESS:  The patient is a 75 year old woman who has  chronic atrial fibrillation.  She had a syncopal episode today around  12:30 p.m. while at the [employer].  She was standing and went to the  floor.  She did not hurt herself significantly.  She had no warning that  she was going to pass out.  She denies any chest pain.  She has had  persistent shortness of breath and requires home oxygen.  She had not  had palpitations.  She has had some abdominal bloating of late.  While  in the ER she had an abdominal CT showing liver cirrhosis with a  questionable liver mass and MRI was recommended.  Her only warning sign  in regards to her syncope was this sensation that she was going to pass  out was that she felt like her blood pressure was too well.   PAST MEDICAL HISTORY:  1. Depression.  2. Atrial fibrillation status post DC cardioversion January 16, 2007.  3. Pacemaker placement.  4. Hypertension.  5. Home oxygen.   PAST SURGICAL HISTORY:  1. Appendectomy.  2. Tonsillectomy.  3. Knee surgery.  4. Hysterectomy.  5. Back surgery.  6. Foot surgery.   SOCIAL HISTORY:  The patient does not smoke or drink.  She lives with  her husband.   MEDICATIONS:  1. Ambien 100 mg day.  2. Atenolol 100 mg b.i.d.  3. Hydrochlorothiazide 25 mg a day.  4. Captopril 50 mg q.i.d.  5. Doxazosin 2 mg q.h.s.  6. Lipitor 10 mg day.  7. Warfarin.  8. Micardis 80 mg a day.  9. Neurontin 300  mg the morning, then 600 mg three times a day after      that.  10.Vicodin p.r.n.  11.Lasix p.r.n.   ALLERGIES:  1. PENICILLIN.  2. SULFA.   REVIEW OF SYSTEMS:  She does report bloating and edema.  No chest pain.  She does have shortness of breath at rest which is chronic.  No recent  vomiting or diarrhea.  No focal weakness.  No rash.  All other systems  negative.   PHYSICAL EXAMINATION:  Blood pressure 154/68, pulse 64, respiratory rate  of 16.  GENERAL:  She is awake and alert, no apparent distress.  NECK:  No carotid bruits.  No JVD.  CARDIOVASCULAR:  Regular rate and rhythm.  S1, S2.  Occasional premature  beats.  LUNGS:  Scant crackles bilaterally.  ABDOMEN:  Firm area in the epigastrium.  EXTREMITIES:  Showed no edema.  No calf  tenderness.  NEUROLOGIC:  No focal deficits.   Troponin less than 0.05, MB 3.7, myoglobin 71.1.  Creatinine 1.0.  Hematocrit 38.6, white count 4.7.  UA showed only small blood.  EKG  shows occasional paced rhythm with apparent underlying atrial  fibrillation and occasional PVCs.   ASSESSMENT AND PLAN:  A 75 year old with atrial fibrillation, prior  pacer and syncope.   PLAN:  1. Unclear etiology of syncope.  Will check orthostatic blood      pressures.  2. Her blood pressure is okay at rest while here in the ER.  We will      hold her doxazosin and cut back on her captopril to t.i.d. just to      be sure that this was not all secondary to low blood pressure.  3. Continue Coumadin for atrial fibrillation per pharmacy dosing.  4. The patient will also need an abdominal MRI to follow this      questionable liver lesion.  Apparently, she does have cirrhosis by      CT scan.  There certainly could be a question of a malignancy in      the liver.  5. Will keep her on telemetry overnight and also rule out MI.  6. Dr. Patty Sermons follow the patient in the morning.      Corky Crafts, MD  Electronically Signed     JSV/MEDQ  D:   10/18/2007  T:  10/19/2007  Job:  [id number]

## 2011-03-26 NOTE — Telephone Encounter (Signed)
WAITING TO HEAR BACK FROM YOU REGARDING TEST. SHE NEEDS TO RUN OUT TO POST OFFICE SO KEEP TRYING HER.

## 2011-03-26 NOTE — Progress Notes (Signed)
advised

## 2011-03-26 NOTE — H&P (Signed)
NAME:  [name] [name], [name] [name]                    ACCOUNT NO.:  [id number]   MEDICAL RECORD NO.:  [id number]          PATIENT TYPE:  INP   LOCATION:  4728                         FACILITY:  [facility]   PHYSICIAN:  Georga Hacking, M.D.DATE OF BIRTH:  02-Jan-1936   DATE OF ADMISSION:  04/26/2008  DATE OF DISCHARGE:                              HISTORY & PHYSICAL   REASON FOR ADMISSION:  Dyspnea.   This 75 year old female has a history of significant shortness of breath  and COPD.  She has had a previous dual-chamber pacemaker inserted for  bradycardia and syncope in 1998 a by Dr. Sherryl Manges.  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  She has had a previous history of  hypertension.  She developed severe claudication previously and Dr.  Arbie Cookey did an angiogram in 2003, that showed occlusion in the right of  the superficial femoral arteries with 3-vessel runoff.  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  She has been quite anxious through the  years and then developed atrial fibrillation.  She evidently had a  cardioversion done by Dr. Patty Sermons last year, but failed to remain in  sinus rhythm and her amiodarone was then discontinued.  She was  hospitalized in December 2008 with syncope likely due to medicines and  hypotension.  It was also noted that she had an abnormal abdominal CT  with stable liver lesion on the dome of the liver.  In addition, she has  a prior psychiatric history and has had a psychiatric admission in 2007.  She evidently has been on home oxygen for unclear reasons.   She was in her usual state of health, but noted progressive dyspnea over  the past 3 days.  Today, she called Dr. Yevonne Pax office and he was  not in the office.  Called Dr. Quita Skye office, he was not in the  office and called Dr. Yevonne Pax office back and  talked to his nurse,  who advised her to come to the emergency room.  She was evaluated by the  emergency room physician and he found her to have a slightly abnormal  chest x-ray, found her to be wheezing, gave her breathing treatments,  and found her to have an elevated BNP level and was uncomfortable with  her going home.  At the present time, her breathing is improved but she  is still mildly short of breath and is extremely anxious.   PAST MEDICAL HISTORY:  Her past history is complex.  She has a history  now of chronic atrial fibrillation, on Coumadin; chronic hypertension; a  previous history of depression; previous abdominal CT scan; and previous  history of Bell palsy.   PAST SURGICAL HISTORY:  Appendectomy, tonsillectomy, partial  hysterectomy, surgery on the right foot, and left knee surgery.   ALLERGIES:  1. PENICILLIN.  2. CALAN.  3. NITROGLYCERIN.  4. ANTI-INFLAMMATORY AGENTS.   CURRENT MEDICATIONS:  1. Neurontin 300 mg  in the morning and 600 at lunch, supper, and      bedtime.  2. Lipitor 10 mg daily.  3. Atenolol 100 mg b.i.d.  4. Vicodin 5/325 t.i.d. for severe back pain.  5. Lasix 40 mg p.r.n. for edema and swelling.  6. Paxil 25 mg q.a.m.  7. Xanax 0.25 mg p.r.n.  8. Warfarin 5 mg Sunday and 2.5 mg other days.  9. Micardis 80 mg daily.  10.Captopril 50 mg t.i.d.  11.She takes p.r.n. Protonix and has used Phenergan in the past.   SOCIAL HISTORY:  Currently lives with her husband.  She quit smoking  several years ago, formerly worked as a Lawyer.  No  significant alcohol usage.   FAMILY HISTORY:  Father died at age 14 in a plane crash.  Mother died at  age 12 of lymphoma.  Two adapted children in good health.   REVIEW OF SYSTEMS:  She has been modestly obese.  She has no severe eye,  ear, nose, or throat problems.  She has a history of vertigo and a  history of Bell palsy.  She has had asthma in the past and has had  longstanding hypertension.   She carries a diagnosis of esophageal reflux  as well as hiatal hernia.  She has no constipation or diarrhea.  She has  some urgency.  She has severe chronic pain syndrome with chronic low  back pain.  She has a prior psychiatric history with depression as well  as severe anxiety.   PHYSICAL EXAMINATION:  GENERAL:  She is an anxious, obese female who is  currently in no acute distress.  VITAL SIGNS:  Her blood pressure is currently 130/80, pulse is currently  80 and irregular, and respirations are 16.  SKIN:  Warm and dry.  Feet are somewhat discolored.  Moderate venous  insufficiency changes are noted.  ENT:  EOMI.  PERRLA.  Fundi not examined.  Pharynx is negative.  CNS:  Clear.  NECK:  Supple.  No masses.  No JVD, thyromegaly, or bruits.  LUNGS:  Decreased breath sounds with mild crackles at the bases.  CARDIOVASCULAR:  Irregular rhythm.  There is normal S1 and S2.  No S3.  ABDOMEN:  Soft and nontender.  Femoral pulses are 2+.  Peripheral pulses  are diminished particularly on the right, 1-2+ on the left.   Chest x-ray shows increased interstitial markings.  EKG shows atrial  fibrillation.  A previous echocardiogram reviewed from December  evidently did not show gradient across left ventricular outflow tract  and showed significant left ventricular hypertrophy.  She evidently had  a peak right ventricular systolic pressure of 45 mm at that time.   LABORATORY DATA:  Shows a pH of 7.40, pO2 of 85, and a pCO2 of 41.  CBC  was normal.  Chemistry panel shows a BUN of 8, creatinine of 0.71,  chloride 107, glucose 93, CO2 22, potassium 4.1, and sodium of 138.   IMPRESSION:  1. Dyspnea - it is uncertain whether this is due to diastolic      dysfunction from severe left ventricular hypertrophy and atrial      fibrillation or whether it is due to significant underlying lung      disease with interstitial changes.  2. Hypertrophic cardiomyopathy, nonobstructive by last echo with       severe left ventricular hypertrophy.  3. Chronic atrial fibrillation with failure to convert following      cardioversion.  4. Anxiety and depression with  prior psychiatric admission.  5. Chronic pain syndrome with severe low back pain.  6. Peripheral vascular disease with previous occlusion of the      superficial femoral artery.  7. History of Bell palsy.  8. History of pulmonary hypertension.  9. Chronic obstructive pulmonary disease with remote history of      cigarette abuse.  10.Previous permanent pacemaker placement for tachycardia-bradycardia      syndrome.   RECOMMENDATIONS:  The patient is quite anxious at this time.  She has  been under stress with relatives who died of cancer as well as  myasthenia gravis.  She was short of breath with elevated BNP.  She will  be diuresed, may be worth a pulmonary consultation.  We will obtain  daily weights, follow serial BNPs and BMPs.  Further workup per Dr.  Patty Sermons.      Georga Hacking, M.D.  Electronically Signed     WST/MEDQ  D:  04/26/2008  T:  04/27/2008  Job:  [id number]   cc:   L. Lupe Carney, M.D.

## 2011-03-26 NOTE — Progress Notes (Signed)
Advised patient of labs and she stated she only takes her potassium when uses lasix.  Advised to start taking daily per Dr. Patty Sermons.

## 2011-03-26 NOTE — Telephone Encounter (Signed)
Message copied by Regis Bill on Tue Mar 26, 2011  4:43 PM ------      Message from: Norma Fredrickson      Created: Fri Mar 22, 2011  4:44 PM       Potassium will be increased to BID.      BNP looks better.      Ok to report.

## 2011-03-26 NOTE — Telephone Encounter (Signed)
Patient states she only takes potassium when she uses her lasix which is not very often.  Advised to increased to one daily.  Patient was complaining of being depressed, advised to contact the doctor she sees for her depression.  Also complaining of hurting all over. Advised to hold her simvastatin for a couple of weeks and let us know how she is doing.

## 2011-03-26 NOTE — Op Note (Signed)
NAME:  [name], [name]                    ACCOUNT NO.:  [id number]   MEDICAL RECORD NO.:  [id number]          PATIENT TYPE:  AMB   LOCATION:  ENDO                         FACILITY:  [facility]   PHYSICIAN:  James L. Malon Kindle., M.D.DATE OF BIRTH:  1936/07/18   DATE OF PROCEDURE:  04/13/2008  DATE OF DISCHARGE:                               OPERATIVE REPORT   PROCEDURE:  Colonoscopy, coagulation of polyp.   INDICATIONS:  The patient with a previous history of colon polyps, with  strong family history of colon polyps.  She has returned to the 5-year  follow up.   DESCRIPTION OF PROCEDURE:  The procedure explained to the patient,  consent obtained.  In the left lateral decubitus position, the Pentax  scope and pediatric scope were inserted and advanced into the cecum  without difficulty.  Using the abdominal pressure, ileocecal valve and  appendiceal orifice were seen.  The scope was withdrawn.  The cecum,  ascending, transverse, descending, and sigmoid colon were seen well.  A  3-mm polyp in the descending colon which was cauterized by hot biopsy  forceps. No diverticula or other polyps.  The rectum was seen well and  was free of polyps in forward and retroflexed view.  Scope was  withdrawn.  The patient tolerated the procedure well.   ASSESSMENT:  1. Descending colon polyp, cauterized.  2. History of previous colon polyps.  3. Family history of colon polyps.   PLAN:  We will resume Coumadin in 2 days and we will repeat colon in 5  years if clinically appropriate.           ______________________________  Llana Aliment. Malon Kindle., M.D.     Waldron Session  D:  04/13/2008  T:  04/14/2008  Job:  [id number]   cc:   Cassell Clement, M.D.  Elsworth Soho, M.D.

## 2011-03-26 NOTE — Telephone Encounter (Signed)
Message copied by Regis Bill on Tue Mar 26, 2011  4:49 PM ------      Message from: Norma Fredrickson      Created: Fri Mar 22, 2011  4:44 PM       Potassium will be increased to BID.      BNP looks better.      Ok to report.

## 2011-03-26 NOTE — Discharge Summary (Signed)
NAME:  [name] [name]                    ACCOUNT NO.:  [id number]   MEDICAL RECORD NO.:  [id number]          PATIENT TYPE:  INP   LOCATION:  4728                         FACILITY:  [facility]   PHYSICIAN:  [name] [name], M.D. DATE OF BIRTH:  14-Jul-1936   DATE OF ADMISSION:  04/26/2008  DATE OF DISCHARGE:  04/29/2008                               DISCHARGE SUMMARY   FINAL DIAGNOSES:  1. Dyspnea secondary to chronic obstructive pulmonary disease and mild      congestive heart failure.  2. Chronic atrial fibrillation, on long-term Coumadin.  3. Hypertrophic cardiomyopathy.  4. Functioning pacemaker.   OPERATIONS PERFORMED:  None.   HISTORY:  This is a 75 year old woman, admitted by Dr. Donnie [name] as an  emergency on the evening of April 26, 2008, who presented with worsening  dyspnea.  She has a history of COPD and a former smoker.  She has home  oxygen.  She has had a history of chronic atrial fibrillation and a  history of anxiety and hypertension.  She presents to the emergency room  with a 3-day history of worsening dyspnea.  On exam in the emergency  room, she was found to have wheezing and an abnormal chest x-ray with  some heavy markings at the right base and her B-natriuretic peptide was  elevated at 450.   Her physical exam showed a blood pressure of 130/80 and pulse was 80 and  slightly irregular.  There were diminished breath sounds and mild rales.  There was no S3 or murmur.  The abdomen was unremarkable.  Pedal pulses  were present.   The CT chest x-ray showed some increased interstitial markings and EKG  showed atrial fibrillation.  She was admitted to telemetry.  She was  given IV Lasix.  Cardiac enzymes were obtained serially and did not show  any evidence of myocardial infarction.  She did develop hypokalemia  requiring potassium supplementation.  Followup chest x-ray showed slight  increase in right lower lobe markings and she began coughing up yellow  sputum and was started  on a Z-Pak and on Mucinex on April 28, 2008.  By  April 29, 2008, she had diuresed well and in fact slightly hyponatremic  secondary to Lasix and BUN and creatinine had also risen.  Her B-  natriuretic peptide had dropped to 112.  On the night prior to her  discharge, the patient's mother who was 51 died and the patient was very  anxious therefore to be discharged home on the morning of April 29, 2008.  At the time of discharge, her blood pressure is 129/69, pulse 70, and  temperature 98.3.  Lungs are clear.  Heart, no gallop.  Extremities, no  edema.   The patient is being discharged, improved.  At the time of discharge,  her INR is 2.3 and will be expected to rise slightly on Zithromax.  Her  hemoglobin is 15 and white count 7300.  Sodium 131, potassium 3.7, BUN  30, and creatinine 1.9.  When she was admitted, her BUN was 18 and  creatinine was 0.7 reflecting the  effect of the initially aggressive  diuresis with IV Lasix.  Lasix had been switched to p.o. on the day  prior to discharge.  Her blood pressure remained stable and at the time  of discharge it is 129/69 and pulse is 70.  Telemetry shows atrial  fibrillation and paced ventricular rhythm.   She is being discharged on a low-sodium, heart-healthy diet.  She is to  increase activity slowly.  She will see Dr. Patty [name] in 2 weeks for a  office visit, protime and a BMET.   DISCHARGE MEDICATIONS:  1. Micardis HCT 80/25 one daily.  2. Captopril 50 mg t.i.d.  3. Warfarin 5 mg Sunday and 2.5 mg other day.  4. Neurontin generic 300 mg taking a total of 7 a day.  5. Atenolol 100 mg twice a day.  6. Paxil CR 25 each evening.  7. Vicodin 10/325 one 4 times a day from the pain clinic.  8. Metanx vitamins from the pain clinic once a day.  9. Mucinex 600 mg twice a day as needed.  10.Z-Pak as prescribed.  11.Xanax 0.25 mg every 8 hours p.r.n. for nerves.  12.Allopurinol 100 mg daily.  13.Simvastatin 10 mg each evening.  14.Lasix 20 mg if  needed for fluid retention and swelling.   CONDITION ON DISCHARGE:  Improved.           ______________________________  [name] [name], M.D.     TB/MEDQ  D:  04/29/2008  T:  04/29/2008  Job:  [id number]

## 2011-03-26 NOTE — Discharge Summary (Signed)
NAME:  [name], [name]                    ACCOUNT NO.:  [id number]   MEDICAL RECORD NO.:  [id number]          PATIENT TYPE:  INP   LOCATION:  3707                         FACILITY:  [facility]   PHYSICIAN:  Cassell Clement, M.D. DATE OF BIRTH:  [birth year]   DATE OF ADMISSION:  10/18/2007  DATE OF DISCHARGE:  10/20/2007                               DISCHARGE SUMMARY   FINAL DIAGNOSIS:  1. Syncope, probably secondary to medication.  2. Chronic atrial fibrillation.  3. Functioning VVI pacemaker.  4. Hypertension.  5. Depression.  6. Chronic Coumadin anticoagulation.  7. Abnormal abdominal CT scan with a stable liver lesion on dome of      liver.  8. Hypercholesterolemia.   OPERATIONS PERFORMED:  2-D echo.   HISTORY:  This 75 year old woman was admitted as an emergency by Dr.  Eldridge Dace, after experiencing syncope while standing in line at the [facility] on Sunday after church.  She was unresponsive for a matter of  a few minutes according to her husband.  She was brought in to the  emergency room where an abdominal CT did not show any evidence of a  dissection.  There was a question of  a hepatic mass on the CT and MRI  was recommended, but can not be done in her case because of presence of  a pacemaker.  The liver mass had been seen on previous CT scans and was  essentially unchanged.  The patient has a past history of hypertension,  left ventricular hypertrophy chronic atrial fibrillation.  She has had  previous attempts at restoration of sinus rhythm with cardioversion  which has been briefly successful but then she was found to have  reverted back to atrial fib and no further plans for cardioversion are  in place.  She has been on amiodarone which will now be stopped.  The  patient has a history of shortness of breath.  She has had low oxygen  levels at home and has home oxygen.   PHYSICAL EXAM:  Blood pressure was 154/68, pulse 64, respirations 16.  She is alert.  There were no  carotid bruits.  The  rhythm was regular.  LUNGS:  Clear to auscultation.  ABDOMEN:  Nontender.  EXTREMITIES:  No phlebitis or edema.  NEUROLOGIC EXAM:  Nonfocal.   In the emergency room, her cardiac enzymes were normal.  Electrolytes  showed a potassium of 3.6 with normal renal function.  Her hemoglobin  and white count were normal.  Her venous blood gases showed pH of 7.36.   HOSPITAL COURSE:  The patient was placed on telemetry.  She had no  further episodes of syncope.  Her blood pressure remained stable.  There  were no significant orthostatic changes.  Her potassium was 3.6 on  admission and dropped to 3.2 on October 19, 2007, and she was begun on K-  Dur supplementation.  She underwent a 2-dimensional echocardiogram which  showed normal left ventricular systolic function.  She is in atrial  fibrillation.  There was mild paradoxical motion of the interventricular  septum consistent with paced  rhythm.  There was mild mitral  regurgitation, mild left atrial enlargement and mild pulmonary arterial  hypertension at 45 mmHg with mild to moderate tricuspid valve  regurgitation, and no source of embolus.  The patient remained stable  throughout the hospital course and by December 9th was ready for  discharge.  We are stopping her doxazosin altogether, and we have cut  back on her Capoten to just 3 times a day, and are stopping her  Cordarone, since there is no indication for it now.   The patient will be going home on a low sodium heart-healthy diet.  She  will keep her appointment on December 23rd, and we will want to check a  BMET as well as a pro time that day.  She will continue her home oxygen  at 2 L a minute.   DISCHARGE MEDICATIONS:  Were adding Phenergan 25 mg q.6 h p.r.n. for  nausea and adding Protonix 40 mg daily for dyspepsia.  She will continue  Micardis 80 mg 1 daily, captopril 50 mg 3 times a day, warfarin 5 mg  taking 1/2 tablet daily or as directed,  hydrochlorothiazide 25 mg daily,  K-Dur 28 mEq twice a day, Lipitor 10 mg daily; Neurontin 300 mg in the  morning, 600 at lunch, supper and bedtime; atenolol 100 mg twice a day,  Vicodin 5/325 one 3 times a day p.r.n. for severe back pain, Lasix 40 mg  p.r.n. for edema or swelling, Paxil 25 mg each morning, Xanax 0.25 mg  p.r.n. for anxiety.   CONDITION ON DISCHARGE:  Improved.   Her chest x-ray showed cardiac enlargement with pulmonary venous  hypertension, mild chronic bronchitic and interstitial lung changes.  No  acute abnormalities and the left subclavian transvenous pacemaker with  leads in the right atrial and right ventricle are noted.           ______________________________  Cassell Clement, M.D.     TB/MEDQ  D:  10/20/2007  T:  10/20/2007  Job:  [id number]

## 2011-03-27 ENCOUNTER — Telehealth: Payer: Self-pay | Admitting: *Deleted

## 2011-03-27 ENCOUNTER — Other Ambulatory Visit: Payer: Self-pay | Admitting: *Deleted

## 2011-03-27 DIAGNOSIS — E876 Hypokalemia: Secondary | ICD-10-CM

## 2011-03-27 NOTE — Telephone Encounter (Signed)
Patient was only taking potassium daily when she used her lasix, which she stated was not very often.  Advised to take potassium daily.  Will recheck at 6/ coumadin check.

## 2011-03-27 NOTE — Telephone Encounter (Signed)
Message copied by Regis Bill on Wed Mar 27, 2011 10:02 AM ------      Message from: Norma Fredrickson      Created: Fri Mar 22, 2011  4:44 PM       Potassium will be increased to BID.      BNP looks better.      Ok to report.

## 2011-03-29 NOTE — Op Note (Signed)
NAME:  Trotti, Lousie                    ACCOUNT NO.:  [id number]   MEDICAL RECORD NO.:  [id number]          PATIENT TYPE:  OIB   LOCATION:  2899                         FACILITY:  [facility]   PHYSICIAN:  Sharolyn Douglas, M.D.        DATE OF BIRTH:  [birth year]   DATE OF PROCEDURE:  04/04/2005  DATE OF DISCHARGE:                                 OPERATIVE REPORT   DIAGNOSES:  1.  Left L4-5 lateral recess narrowing.  2.  Left L5-S1 foraminal narrowing.   PROCEDURES:  1.  Left L4-5 laminotomy and lateral recess decompression.  2.  Left L5-S1 laminotomy and foraminotomy.   SURGEON:  Sharolyn Douglas, M.D.   ASSISTANT:  Verlin Fester, P.A.   ANESTHESIA:  General endotracheal.   ESTIMATED BLOOD LOSS:  Minimal.   COMPLICATIONS:  None.   INDICATIONS:  The patient is a pleasant lady with chronic persistent left  lower extremity pain.  She also has a component of low back discomfort.  Her  CT myelogram demonstrates lateral recess narrowing on the left at L4-5 and  foraminal narrowing at L5-S1.  Because of her intractable symptoms unrelated  to conservative care, she elected undergo micro-decompression in hopes of  improving her symptoms.  She knows that she will continue to have some degree of discomfort related  to her back but hopefully will see some improvement in her leg pain.   PROCEDURE:  The patient was identified in the holding area, taken to the  operating room, underwent general endotracheal anesthesia without  difficulty, given prophylactic IV antibiotics.  Carefully positioned prone  on the Wilson frame, all bony prominences padded.  Face and eyes protected  at all times.  Back prepped and draped in the usual sterile fashion.  Fluoroscopy brought in the field.  The L4-5, L5-S1 levels were identified.  A 3 cm incision was made just to the left of midline over the L4-5  interspace.  Dissection was carried through the deep fascia. The Maxcess retractor system  was utilized to gently spread the  paraspinal muscles backing on the L4-5  interspace.  The retractor was attached to the table using the attachment  arm.  The microscope was draped and brought into the field.  We then  completed a laminotomy by removing the inferior one-third of the L4 lamina,  the medial one-third of the fact joint complex, ligamentum flavum removed  piecemeal.  The thecal sac and L5 nerve root was identified, mobilized  medially.  The lateral recesses was decompressed flush with the pedicle.  We  then readjusted the retractor so that it was over the L5-S1 interspace.  Again a laminotomy was carried out, the ligamentum flavum was removed  piecemeal.  We removed the medial one-third of the L5-S1 facet joint  complex.  We identified the L5 nerve root and decompressed it out the L5-S1  foramen.  We found the foramen to be stenotic.  Using a Buxton-Kerrison  punch, a wide foraminotomy was completed.  The wound was irrigated.  Bleeding was controlled.  Fentanyl 2 mL left in the  epidural space for  postoperative analgesia.  Deep fascia closed with a running 0Vicryl suture, subcutaneous layer closed  with 2-0 Vicryl followed by Dermabond to approximate the skin edges.  The  patient was turned supine, extubated without difficulty and transferred to  recovery in stable condition.      MC/MEDQ  D:  04/04/2005  T:  04/04/2005  Job:  [id number]

## 2011-03-29 NOTE — Op Note (Signed)
   NAME:  [name] [name], [name] [name]                              ACCOUNT NO.:  [id number]   MEDICAL RECORD NO.:  [id number]                   PATIENT TYPE:  AMB   LOCATION:  ENDO                                 [facility]ACILITY[facility]  [facility]   PHYSICIAN:  James L. Malon Kindle., M.D.          DATE OF BIRTH:  1936-11-04   DATE OF PROCEDURE:  02/15/2003  DATE OF DISCHARGE:                                 OPERATIVE REPORT   PROCEDURE:  Colonoscopy and polypectomy.   MEDICATIONS:  Fentanyl 100 mcg, Versed 12 mg IV.   INDICATIONS FOR PROCEDURE:  A woman who has had a previous history of colon  polyps who is overdue for a five year followup colonoscopy.   DESCRIPTION OF PROCEDURE:  The procedure had been explained to the patient  and consent obtained. With the patient in the left lateral decubitus  position, the Olympus adjustable pediatric video colonoscope was inserted  and advanced. The prep was excellent.  Using abdominal pressure and position  changes, we were able to reach the cecum, ileocecal valve seen. Scope  withdrawn and the appendiceal orifice and ileocecal valve clearly  identified. The cecum, ascending colon, transverse  and descending colon  seen well. At 85 cm in the anal verge upon withdrawal in the descending  colon, a 1/2 cm polyp was snared, sucked through the scope. There was no  bleeding, no other polyps were seen throughout the sigmoid or rectum. The  scope was withdrawn. The patient tolerated the procedure well.   ASSESSMENT:  Descending colon polyp removed.   PLAN:  Will check path. Routine post polypectomy instructions. Will  recommend repeating procedure in three years.                                                James L. Malon Kindle., M.D.    Waldron Session  D:  02/15/2003  T:  02/15/2003  Job:  [id number]   cc:   L. Lupe Carney, M.D.  [address] [place]  [place] [address]  Fax: [phone]   Cassell Clement, M.D.  [address], [address]  [place]  [place] [address]  Fax:  [phone]

## 2011-03-29 NOTE — H&P (Signed)
NAME:  [name] [name], [name] [name]                    ACCOUNT NO.:  [id number]   MEDICAL RECORD NO.:  [id number]          PATIENT TYPE:  OIB   LOCATION:                               FACILITY:  [facility]   PHYSICIAN:  Colleen Can. Deborah Chalk, M.D.DATE OF BIRTH:  1936/06/27   DATE OF ADMISSION:  01/02/2005  DATE OF DISCHARGE:                                HISTORY & PHYSICAL   CHIEF COMPLAINT:  None.   HISTORY OF PRESENT ILLNESS:  [name] [name] is a pleasant 75 year old white female  who presents for elective generator replacement. She has had a functioning  dual chamber pacemaker that was implanted in May 1998 for history of  bradycardia/syncope. She has basically done well since that time. She has  had transtelephonic pacemaker evaluation and most recently this has  demonstrated end of life parameters. Clinically, she has had no cardiac  complaints. She now presents for elective replacement.   PAST MEDICAL HISTORY:  1.  History of IHSS.  2.  History of bradycardia/syncope with subsequent pacemaker implantation in      1998 with a Trilogy SR pacesetter pulse generator.  3.  History of remote cardiac catheterization.  4.  History of hypertension.  5.  Chronic back pain.  6.  History of hyperlipidemia.  7.  Prior history of arrhythmia maintained on chronic amiodarone.  8.  History of palpitations.  9.  Osteoarthritis.   ALLERGIES:  PENICILLIN, CALAN, NITROGLYCERIN, ANTI-INFLAMMATORIES, SULFA.   CURRENT MEDICATIONS:  1.  Darvocet p.r.n.  2.  Amiodarone 200 mg half tablet daily.  3.  Hydrochlorothiazide 25 mg daily.  4.  Capoten 50 mg q.i.d.  5.  Lipitor 10 mg daily.  6.  Atenolol 100 mg b.i.d.   FAMILY HISTORY:  Father died at age 34 in a plane crash. Mother died at age  63 of lymphoma.   SOCIAL HISTORY:  She is married. She previously worked as a Lawyer. She does have a past history of tobacco use. There is no alcohol  use.   PAST SURGICAL HISTORY:  1.  Appendectomy.  2.   Tonsillectomy.  3.  Partial hysterectomy.  4.  Surgery on the right foot as well as left knee surgery.  5.  Recent mediastinoscopy by Dr. Karle Plumber in 2005.   PHYSICAL EXAMINATION:  GENERAL: She is a very pleasant white female,  currently in no acute distress.  VITAL SIGNS: Blood pressure is 150/80 sitting and standing, heart rate 56,  weight 146.5 pounds.  SKIN: Warm and dry. Color is unremarkable.  LUNGS: Basically clear.  CARDIAC: Regular rhythm.  ABDOMEN: Soft, positive bowel sounds.  EXTREMITIES: Without edema.  CHEST: Her pacemaker is in the left pectoral chest.  NEUROLOGIC: Intact. There are no gross focal deficits.   Pertinent labs are pending.   OVERALL IMPRESSION:  1.  End-of-life pulse generator.  2.  History of idiopathic hypertrophic subaortic stenosis.  3.  Recent history of abnormal chest x-ray with mediastinal lymphadenopathy      and previous mediastinoscopy.  4.  Chronic back pain.  5.  Hyperlipidemia.  6.  Previously placed dual chamber pacemaker for bradycardia/syncope.  7.  History of antiarrhythmic therapy.  8.  History of hypertension.   PLAN:  Will proceed on with elective generator replacement. The procedure  has been reviewed in full detail and she is willing to proceed on January 02, 2005.      ________________________________________  Sharlee Blew, N.P.  ___________________________________________  Colleen Can. Deborah Chalk, M.D.    LC/MEDQ  D:  12/31/2004  T:  12/31/2004  Job:  [id number]   cc:   Cassell Clement, M.D.  [address]  [place]  [place] [address]  Fax: [phone]   Dr. Ethelene Hal

## 2011-03-29 NOTE — Consult Note (Signed)
[place]  Patient:    [name] [name], [name] [name] Visit Number: [id number] MRN: [id number]          Service Type: DSU Location: [facility] 2899 15 Attending Physician:  Alyson Locket Dictated by:   Clovis Pu Patty Sermons, M.D. Proc. Date: 12/08/01 Admit Date:  12/08/2001   CC:         [name], M.D.  Abran Cantor. Clovis Riley, M.D.  James L. Randa Evens, M.D.   Consultation Report  HISTORY OF PRESENT ILLNESS:  This is a 75 year old woman known to me who is presently in [name] section C awaiting ultrasonic closure of a pseudoaneurysm.  The patient has a known history of IHSS and a past history of bradycardia syncope, for which she had a dual-chamber pacemaker inserted in 1998 by Dr. Berton Mount.  The patient reports that approximately 15 years ago she had a cardiac catheterization by the [facility] and at that time she was found to have normal coronaries by history and it was at that time that the IHSS was discovered.  The patient has a history of hypertension and recent medications are hydrochlorothiazide 25 mg daily, Pacerone 100 mg daily, atenolol 100 mg in the morning and 50 mg at night, Limbitrol generic twice a day, Estrace 1 mg daily, Captopril 50 mg daily, and Lescol XL 80 once a day. She was last seen in our office on July 27, 2001 and at that time had a pacemaker check which showed normal pacer function.  The patient recently had been experiencing some chest discomfort which occasioned this consultation. The patient has been experiencing a severe discomfort which seems to start in the high epigastric area and then radiate up across the chest and into her arms.  It occurs primary at night.  Dr. Clovis Riley has felt that this most likely represents a gastrointestinal problem and had made arrangements for her to see Dr. Randa Evens on December 14, 2001.  FAMILY HISTORY:  Her father died at age 20 in a plane crash.  Her mother died at age 22 of  lymphoma.  She has two adopted children in good health.  She is married.  She is a Lawyer.  She has a history of cigarette usage and she does not use alcohol.  PAST SURGICAL HISTORY:  Appendectomy, tonsillectomy, partial hysterectomy, and surgery on the right foot and she has also had left knee surgery.  ALLERGIES: 1. PENICILLIN. 2. CALAN. 3. NITROGLYCERIN. 4. ANTI-INFLAMMATORIES. 5. ______.  PAST MEDICAL HISTORY:  She has a remote history of a hiatal hernia.  She has been told that she has gallbladder sludge.  Dr. Victorino Dike is her gastroenterologist.  She has had a history of pneumonia as a child and is prone to asthmatic bronchitis.  She has had a long history of hypertrophic cardiomyopathy, as noted above.  PHYSICAL EXAMINATION:  VITAL SIGNS:  Blood pressure 147/60, pulse 60 and paced, respirations are normal.  HEENT:  Pupils are equal and reactive.  Sclerae clear.  Mouth and pharynx normal.  NECK:  Carotids normal.  Jugular venous pressure normal.  Thyroid normal.  No lymphadenopathy.  CHEST:  Clear.  HEART:  A grade 1/6 systolic ejection murmur at the base.  ABDOMEN:  Soft and nontender.  There is a pulsatile mass in the right groin at the area of the pseudoaneurysm.  EXTREMITIES:  No edema or phlebitis.  SKIN:  Clear, warm, and dry.  NEUROLOGIC:  She is intact.  LABORATORY DATA:  Her CK-MB  and troponin-Is are negative today.  IMPRESSION:  Chest pain of uncertain etiology, rule out gastroesophageal reflux disease versus angina versus secondary to idiopathic hypertrophic subaortic stenosis.  There is no evidence for myocardial infarction.  DISPOSITION:  Proceed with the ultrasonic compression of the pseudoaneurysm as planned.  We will plan to follow her up in the office tomorrow or the next day for an office visit, then arrange for an outpatient Cardiolite.  She should continue to pursue the gastrointestinal workup as already scheduled with Dr.  Randa Evens. Dictated by:   Clovis Pu Patty Sermons, M.D. Attending Physician:  Alyson Locket DD:  12/08/01 TD:  12/08/01 Job: [id number]

## 2011-03-29 NOTE — Procedures (Signed)
[facility]  Patient:    [name] [name], [name] [name] Visit Number: [id number] MRN: [id number]          Service Type: Attending:  Llana Aliment. Randa Evens, M.D. Dictated by:   Llana Aliment. Randa Evens, M.D. Proc. Date: 02/15/02   CC:         Abran Cantor. Clovis Riley, M.D.  Thomas A. Patty Sermons, M.D.   Procedure Report  DATE OF BIRTH:  September 15, 1936.  PROCEDURE:  Esophagogastroduodenoscopy.  MEDICATIONS:  Cetacaine spray, Fentanyl 75 mcg, Versed 6 mg IV.  INDICATION:  Persistent gastroesophageal reflux symptoms despite aggressive antireflux therapy.  DESCRIPTION OF PROCEDURE:  The procedure had been explained to the patient and consent obtained. With the patient in the left lateral decubitus position, the Olympus video scope was inserted and advanced under direct visualization. The stomach was entered, the pylorus identified and passed. The duodenum, including the bulb and second portion were seen well and were unremarkable. The scope was withdrawn back into the stomach. The antrum and body were normal. Fundus and cardia seen well in the retroflex view and were normal. The scope was withdrawn. The distal esophagus was quite reddened but it had no evidence of Barretts, no ulceration. The proximal esophagus was normal. The scope was withdrawn. The patient tolerated the procedure well.  ASSESSMENT:  Reddened esophagus consistent with gastroesophageal reflux disease. No obvious evidence of Barretts.  PLAN:  Will continue on Protonix in the morning and Pepcid in the evening. I gave a sheet of antireflux instructions and see back in the office in four to six weeks. Dictated by:   Llana Aliment. Randa Evens, M.D. Attending:  Llana Aliment. Randa Evens, M.D. DD:  02/15/02 TD:  02/15/02 Job: [id number]

## 2011-03-29 NOTE — Procedures (Signed)
[place]  Patient:    [name] [name], [name] [name] Visit Number: [id number] MRN: [id number]          Service Type: DSU Location: [facility] 2899 25 Attending Physician:  Alyson Locket Dictated by:   [name], M.D. Proc. Date: 12/02/01 Admit Date:  12/02/2001 Discharge Date: 12/02/2001   CC:         Melvyn Neth D. Clovis Riley, MD   Procedure Report  PREOPERATIVE DIAGNOSIS:  Limiting right lower extremity claudication with right superficial femoral artery stenosis.  POSTOPERATIVE DIAGNOSIS:  Limiting right lower extremity claudication with right superficial femoral artery stenosis.  OPERATION PERFORMED:  Right superficial femoral artery balloon angioplasty, right leg arteriogram.  SURGEON:  [name], M.D.  ASSISTANT:  Nurse.  ANESTHESIA:  1% lidocaine local, 2 mg Versed sedation.  COMPLICATIONS:  None.  DISPOSITION:  To holding area stable.  DESCRIPTION OF PROCEDURE:  The patient was taken to the peripheral vascular cath lab and placed in supine position where the area of the right groin was prepped and draped in the usual sterile fashion.  Using an antegrade stick with an 18 gauge needle.  The right common femoral artery was easily entered and a Wholey wire was passed down the superficial femoral artery and a 6 French sheath was passed over this.  A diagnostic arteriogram was taken through the sheath and this revealed a subtotal occlusion of the superficial femoral artery at the adductor canal.  The Wholey wire itself was completely occlusive of this.  The patient had been given 4000 units of intravenous heparin after placement of the sheath.  Preprocedural diagnostic arteriogram revealed high grade stenosis at this level over a 1.5 cm segment with no evidence of distal occlusive disease.  A guide wire was passed across the lesion and a 4 mm x 2 cm Powerflex balloon was inflated for 30 seconds, 10 atmospheres this region.  Repeat diagnostic study  through the sheath revealed an improvement in the flow channel.  Next an up size balloon 5 mm x 2 cm was passed at the level of the lesion and reinflated.  This was done at two different levels at the level of the lesion itself.  Final arteriogram to this level showed no evidence of residual flow limiting stenosis.  There was no evidence of extravasation and a good result from the angioplasty.  The patient tolerated the procedure without immediate complication and was transferred to holding area where the sheath was removed after the ACT was normalized. Dictated by:   [name], M.D. Attending Physician:  Alyson Locket DD:  12/02/01 TD:  12/03/01 Job: [id number]

## 2011-03-29 NOTE — Op Note (Signed)
   NAME:  [name] [name], [name] [name]                              ACCOUNT NO.:  [id number]   MEDICAL RECORD NO.:  [id number]                   PATIENT TYPE:  OIB   LOCATION:  NA                                   FACILITY:  [facility]   PHYSICIAN:  Ines Bloomer, M.D.              DATE OF BIRTH:  Feb 06, 1936   DATE OF PROCEDURE:  08/11/2003  DATE OF DISCHARGE:                                 OPERATIVE REPORT   INDICATIONS FOR PROCEDURE:  The patient came to the emergency room with  shortness of breath and chest pain.  She had a previous history of coronary  artery disease.  Her Cardiolite was negative, but a pulmonary CT scan showed  mediastinal adenopathy.  Because of the mediastinal adenopathy, she was  referred for a mediastinoscopy.   PREOPERATIVE DIAGNOSIS:  Mediastinal adenopathy.   POSTOPERATIVE DIAGNOSIS:  Mediastinal adenopathy.   OPERATION:  Fiberoptic bronchoscopy and mediastinoscopy.   SURGEON:  Ines Bloomer, M.D.   ANESTHESIA:  General anesthesia.   DESCRIPTION OF PROCEDURE:  After general anesthesia, she was prepped and  draped in the usual sterile manner.  The fiberoptic bronchoscope was passed  through the endotracheal tube.  The right upper lobe, right middle lobe, and  right lower lobe orifices were normal.  The left upper lobe and left lower  lobe orifices were normal.  The carina was in the midline.  Brushings and  washings were taken for culture and cytology.  Then the anterior neck was prepped and draped in the usual sterile manner.  A transverse incision was made and was carried down with electrocautery  through the subcutaneous tissue and fascia.  The pretracheal fascia was  entered and the video mediastinoscope was inserted.  Biopsies of a 2R, 4R  and 7 nodes were done, with part of the 7 node sent for cultures.  The strap  muscles were closed with #2-0 Vicryl.  The subcutaneous closed with #3-0  Vicryl, and DermaBond for the skin.  The patient tolerated the procedure  well and was returned to the recovery  room in stable condition.                                                Ines Bloomer, M.D.    DPB/MEDQ  D:  08/11/2003  T:  08/11/2003  Job:  [id number]

## 2011-03-29 NOTE — Op Note (Signed)
NAME:  [name] [name], [name] [name]                              ACCOUNT NO.:  [id number]   MEDICAL RECORD NO.:  [id number]                   PATIENT TYPE:  INP   LOCATION:  3017                                 FACILITY:  [facility]   PHYSICIAN:  Pramod P. Pearlean Brownie, MD                 DATE OF BIRTH:  10-Dec-1935   DATE OF PROCEDURE:  DATE OF DISCHARGE:  08/28/2003                                 OPERATIVE REPORT   REASON FOR ADMISSION:  Transient ischemic attack.   DISCHARGE DIAGNOSES:  1. Brainstem transient ischemic attack.  2. Pacemaker.  3. Idiopathic hypertrophic subaortic stenosis.  4. Hypertension.   HISTORY OF PRESENT ILLNESS:  The patient is a 75 year old lady who was  admitted for evaluation  of sudden onset of vertigo  along with all 4  extremity weakness. The episode was  transient and  resolved completely by  the time she was in the emergency room. Her neurological examination was  nonfocal in the emergency room. A noncontrast CT scan of the head revealed  only white matter micro angiopathic changes without any acute infarction.   HOSPITAL COURSE:  She was admitted for stroke risk stratification workup and  underwent  telemetry  monitoring which did not reveal any cardiac  arrhythmia. A CT scan of the head was repeated the next day which did not  show any definite infarction. A CT scan angiogram of the brain  was done  which revealed patent vertebral arteries as well as basilar artery without  significant stenosis or aneurysm. A cardiac echo was done and the results  are pending  at the time of discharge. Her homocysteine  and lipid profile  were  both normal. A carotid ultrasound revealed mild to moderate irregular  plaque in the left carotid bifurcation with 40% to 60% left ICA stenosis.  The vertebral artery flow was antegrade in both directions.   The patient remained stable throughout her hospitalization  and had no new  complaints. She did complain of  some mild right leg weakness at  night which  improved  when she got up and walked, which may have been possibly been  medication related. At the time of discharge she was advised to take Plavix  75 mg every day for secondary stroke prevention. She had previously had  bleeding problems with aspirin.   DISCHARGE MEDICATIONS:  1. Plavix 75 mg every day.  2. Atenolol 50 mg b.i.d.  3. Captopril 50 mg q.i.d.  4. Hydrochlorothiazide 25 mg q. h.s.  5. Crestor 1 tablet daily.  6. Cordarone 50 mg daily.  7. Limbitrol 12.5/5 mg 1 tablet  q. h.s.   DISCHARGE INSTRUCTIONS:  She was advised to have a low fat, low salt diet.    FOLLOW UP:  She was advised to follow up with her primary doctor in the  future as necessary and with Dr. Pearlean Brownie in  2 months.                                               Pramod P. Pearlean Brownie, MD    PPS/MEDQ  D:  08/28/2003  T:  08/28/2003  Job:  [id number]   cc:   Colleen Can. Deborah Chalk, M.D.  Fax: [phone]

## 2011-03-29 NOTE — H&P (Signed)
NAME:  [name] [name], [name] [name]                    ACCOUNT NO.:  [id number]   MEDICAL RECORD NO.:  [id number]           PATIENT TYPE:   LOCATION:                                 FACILITY:   PHYSICIAN:  Sharolyn Douglas, M.D.        DATE OF BIRTH:  1935-12-04   DATE OF ADMISSION:  04/04/2005  DATE OF DISCHARGE:                                HISTORY & PHYSICAL   CHIEF COMPLAINT:  Pain in my back and legs, mostly on the left.[name] [name]   PRESENT ILLNESS:  This is a 75 year old white female seen by Dr. Noel Gerold for  continued progressive problems concerning pain in her back, but with severe  pain in the left lower extremity.  She is having more and more problems  continuing with her day-to-day activities.  She can only walk for short  distances before this pain increases.  Standing is markedly uncomfortable  with pain radiating into the right lower extremity.  She has had epidural  steroid injections with no real results, and unfortunately she had some side  effects.  She has also had oral steroids as well.  She is really quite  miserable and this combined with positive findings on myelogram with  impingement of the L5 nerve root in the lateral recess as well as in the  foramen at L5,S1 it is felt she would benefit from surgical intervention.   The patient has been cleared preoperatively as far as her cardiac status is  concerned by Dr. Cassell Clement.  The risks and benefits of surgery have  been discussed with the patient and we will go ahead with surgical  intervention on this admission.   PAST MEDICAL HISTORY:  The patient's family physician is Dr. Lupe Carney.  Dr. Cassell Clement is her cardiologist.   She is allergic to PENICILLIN, ALL SULFA-TYPE DRUGS.  She does not want to  take Verapamil or nitroglycerin.   CURRENT MEDICATIONS:  1.  Captopril 50 mg four times daily.  2.  HCTZ 25 mg one daily.  3.  Amiodarone 100 mg one daily.  4.  Amitriptyline CDR 12.5/5 1-2 daily.  5.  Atenolol one b.i.d.  6.   Lipitor 10 mg one daily.  7.  Hydrocodone for discomfort.   She has a history of small transient ischemic attack in 2005.  When she gets  a cold she has asthma.  She has hypertension, heart murmur by report.  She  also has peripheral vascular disease.  She has reflux as well, and frequent  urinary tract infections.   SURGERIES DONE IN THE PAST:  Tonsillectomy about age 49, appendix about age  56, partial hysterectomy in her mid 81s, tumor on her nerves of her toes in  her 60s.  She has had arthroscopic surgery of both knees.  Several D&Cs  after miscarriages in her early 24s, pacemaker implanted in 1998.  She had a  hematoma after a heart cath and had to have surgery for that.  She has also  had a mediastinoscope which was negative.   Family history is positive  for heart disease, hypertension, diabetes,  cancer, stroke, arthritis, and bleeding disorder.   SOCIAL HISTORY:  The patient is married.  She is a housewife.  Substitute  NEHs.  She smokes half pack of cigarettes per day.  No intake of alcohol  products.  Her husband will be primary caregiver after surgery.   REVIEW OF SYSTEMS:  CNS:  No seizure disorder or paralysis, double vision.  The patient does have radiculitis consistent with  L5 nerve root on the  right.  CARDIOVASCULAR:  No chest pain.  No angina, orthopnea.  GI:  No  nausea, vomiting, melena, bloody stool.  GU:  No discharge, dysuria, or  hematuria.  MUSCULOSKELETAL:  Primarily in present illness.   PHYSICAL EXAMINATION:  GENERAL:  Alert, cooperative, friendly, somewhat  apprehensive 75 year old white female who is well groomed and nicely  dressed.  VITAL SIGNS:  Blood pressure 160/80, pulse 80, respirations 12.  HEENT:  Normocephalic. PERRLA.  EOMI.  Oropharynx is clear.  CHEST:  Clear to auscultation.  No rhonchi.  No rales.  HEART:  Regular rate and rhythm.  No murmurs are heard.  ABDOMEN:  Soft, nontender.  Liver, spleen not felt.   GENITALIA/RECTAL/PELVIC/BREASTS:  Not done, not pertinent to present  illness.  EXTREMITIES:  The patient has a positive straight leg raise on the right.   ADMISSION DIAGNOSES:  1.  L4, L5 left lateral recess stenosis with L4,S1 left foraminal stenosis.  2.  Pacemaker.  3.  Hypertension.  4.  History of stroke (transient ischemic attack).  5.  Gastroesophageal reflux disease.   PLAN:  The patient will undergo L4, L5 lateral recess decompression with L5,  S1 left foraminotomy.  Should we have any medical problems, will certainly  ask Dr. Lupe Carney to follow along with [place], and should she have any  cardiac problems we will contact Dr. Cassell Clement.   This history and physical was performed in our office on Mar 27, 2005, and  the patient was fitted today with a Centric lumbar orthosis which will be  used postoperatively.      DLU/MEDQ  D:  03/28/2005  T:  03/28/2005  Job:  [id number]   cc:   L. Lupe Carney, M.D.  [address] [place]  [place] [address]  Fax: [phone]   Cassell Clement, M.D.  [address]  [place]  [place] [address]  Fax: [phone]

## 2011-03-29 NOTE — H&P (Signed)
NAME:  [name] [name], [name] [name]                    ACCOUNT NO.:  [id number]   MEDICAL RECORD NO.:  [id number]          PATIENT TYPE:  IPS   LOCATION:  0501                          FACILITY:  [facility]   PHYSICIAN:  Geoffery Lyons, M.D.      DATE OF BIRTH:  1936/05/31   DATE OF ADMISSION:  08/09/2006  DATE OF DISCHARGE:  08/10/2006                         PSYCHIATRIC ADMISSION ASSESSMENT   This is a voluntary admission.   IDENTIFYING INFORMATION:  This is a 75 year old married white female.  She  presented yesterday to the emergency room at approximately 1:00 at [facility].  She reported that she was having increased anxiety and  suicidal thoughts, and she was quite tearful on arrival.  She was evaluated.  She was noted to have benzodiazepines in her urine, and she had no alcohol  on board.  The remainder of her labs were within normal limits.  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  She is approaching her 50th wedding anniversary, and her mother, who  is 58, has physically been in her home for over three years now.  Apparently, she just feels overwhelmed with all the people that are coming  into her home because of her mom.  There is a woman that comes Monday  through Friday for 6 or 7 hours a day.  Whenever she goes to church, she has  to hire a babysitter, etc., etc.  At any rate, her younger sister, who lives  down in [place], has physically taken her mother down to her home  and is arranging to have all the CAP services transferred.  Today, [name] [name]  reports that her private physician, Dr. Clovis Riley, had started her on Celexa  approximately five days ago.  She is tolerating this better than Cymbalta.  Another physician who was treating her for back pain had put her on  Cymbalta.  She states she is just not comfortable in the environment here at  [facility].  She is agreeable to coming back in the morning  and going to  the IOP.  Since apparently she went to the hospital at the  suggestion of a friend, and she had already signed a request for 72-hour  discharge, we are going to go ahead and release her to her husband and make  arrangements for her to come back in the morning to IOP.   PAST PSYCHIATRIC HISTORY:  She has none.   SOCIAL HISTORY:  She went to [employer].  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  She has been married once.  She will be married 50 years next  year.  She has a daughter, age 61, and another adopted daughter, age 83.  She is retired, but she has been taking care of her 60 year old mother.   FAMILY HISTORY:  She denies.   ALCOHOL AND DRUG HISTORY:  She quit smoking back in May 2007.   PRIMARY CARE [name] [name]:  Elsworth Soho, M.D.   MEDICAL PROBLEMS:  1. Atrial fibrillation.  2. Hypertension.  3. She has a pacemaker.   She is currently prescribed:  1. Citalopram 20 mg p.o. daily.  2. Amiodarone 200 mg 1/2 b.i.d.  3. Amitriptyline.  4. Aspirin.  5. Atenolol 100 mg t.i.d.  6. Captopril 50 mg x4 daily.  7. Gabapentin 100 mg t.i.d.  8. Hydrochlorothiazide 25 mg p.o. daily.  9. Alprazolam 0.25 mg p.r.n.  10.Methocarbamol and aspirin 500 mg t.i.d.  11.Doxazosin 2 mg in the p.m.  12.Diovan 160 mg p.o. daily.  13.Celexa 20 mg p.o. daily.   DRUG ALLERGIES:  She states that SULFA gives her nausea and vomiting;  PENICILLIN, rash.  NSAIDs irritate her stomach.   PHYSICAL EXAMINATION:  GENERAL:  Unremarkable.  VITAL SIGNS ON ADMISSION:  She is 63 inches tall, she weighs 148 pounds, her  temperature is 98.3, blood pressure is 163/77-176/91, pulse is 60-100,  respirations are 20.  SKIN:  She has a scar on her low back, where a bone spur was removed.  She  has a pacemaker under her left clavicle.  She is status post an appendectomy  and has had arthroscopy on both knees.   MENTAL STATUS EXAM:  She is alert and oriented x3.  Her speech is a normal  rate, rhythm, and tone.   She is appropriately groomed, dressed, and motor is  normal.  Her mood is depressed.  Her affect is labile.  She dries easily.  Thought processes are clear, rational, and goal oriented.  Judgment and  insight are intact.  Concentration and memory are intact.  Intelligence is  at least average.  She denies being suicidal or homicidal.  She denies  having had auditory or visual hallucinations.   DIAGNOSES:   AXIS I:  Depressive disorder, not otherwise specified.  Anxiety disorder, not otherwise specified.[name]   AXIS II:  Deferred.   AXIS III:  Shoulder pain.   AXIS IV:  Moderate.   AXIS V:  30.   PLAN:  Was to admit for safety and stabilization.  However, as the patient  does not feel she will benefit in this milieu, we will make arrangements for  her to start IOP in the morning.      Mickie Leonarda Salon, P.A.-C.      Geoffery Lyons, M.D.  Electronically Signed    MD/MEDQ  D:  08/10/2006  T:  08/10/2006  Job:  [id number]

## 2011-03-29 NOTE — H&P (Signed)
[place]  Patient:    [name] [name], [name] [name] Visit Number: [id number] MRN: [id number]          Service Type: DSU Location: [facility] 2899 25 Attending Physician:  Alyson Locket Dictated by:   Tollie Pizza. Collins, P.A.-C. Admit Date:  12/02/2001   CC:         Abran Cantor. Clovis Riley, M.D.  Thomas A. Patty Sermons, M.D.   History and Physical  CHIEF COMPLAINT:  Right calf pain.  HISTORY OF PRESENT ILLNESS:  Patient is a 75 year old white female who has experienced a several-month history of right calf claudication which over the past three to four weeks has increased in severity.  At the present time she reports that she can only walk through her house and back - which is approximately 60 feet - before she develops significant calf pain.  She also has some pain which occasionally radiates into her hip and she is uncertain whether it is related to her claudication symptoms or whether this is some arthritis pain.  She also notes pain in her calf when she is driving, and occasionally at rest.  She has had no night pain, nonhealing ulcers, or ischemic changes.  She does note that her right foot seems to be cooler than the left, and also when she holds her legs in a dependent position, her right foot becomes cyanotic.  She was seen in the office by Dr. Arbie Cookey and had a lower extremity arterial evaluation performed in the office.  Her ABIs were 0.84 on the right and 0.95 on the left with a significant drop in right ankle pressure after exercise, with rapid recovery time suggestive of significant right lower extremity arterial occlusive disease.  It was recommended that she proceed with arteriography at this time and possible angioplasty of the right superficial femoral artery.  PAST MEDICAL HISTORY: 1. Hypertension. 2. Hyperlipidemia. 3. Gastroesophageal reflux disease/hiatal hernia. 4. Anxiety/depression. 5. 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 6. She has had a recent fungal rash on her back which is being treated by    Dr. Clovis Riley.  PAST SURGICAL HISTORY: 1. Appendectomy. 2. Tonsillectomy. 3. Partial hysterectomy. 4. Placement of permanent pacemaker in 1998. 5. Bilateral knee arthroscopies. 6. Removal of toe lesions which the patient calls neuromas, and I am uncertain    of the exact diagnosis here. 7. History of previous D&Cs. 8. Repair of right femoral artery hematoma following cardiac catheterization;    surgery performed by Dr. Edwyna Shell.  CURRENT MEDICATIONS: 1. Captopril 50 mg q.i.d. 2. Hydrochlorothiazide 25 mg q.h.s. 3. Atenolol 100 mg q.a.m. and 50 mg q.p.m. 4. Lescol 80 mg q.h.s. 5. Protonix 40 mg q.d. 6. Estradiol 1 mg q.d. 7. Pacerone 200 mg one-half tablet q.d. 8. Limbitrol 5/12.5 mg b.i.d. 9. Nystatin with triamcinolone cream b.i.d. for back rash.  ALLERGIES:  PENICILLIN which causes a rash.  NONSTEROIDAL ANTI-INFLAMMATORY DRUGS cause GI upset.  CALAN created marked hypotensive episode.  She states that after she was given IV DYE her last arteriogram, she developed itching that night.  REVIEW OF SYSTEMS:  Pertinent positives are noted in the history of present illness.  She states that she bruises very easily.  She develops sweats and becomes very shaky when she feels that her blood sugar drops.  She has periodic abdominal pain radiating into her chest which is related to her hiatal hernia.  She is scheduled to see Dr. Randa Evens about this problem.  She  complains of arthritis pain.  She has had a Bells palsy in the past and has periodic facial tingling on the left in the same area, which has been previously evaluated by a neurologist.  She has symptoms of anxiety and depression.  She has developed a recent, somewhat pruritic rash on her back which is being treated by Dr. Clovis Riley.  She denies weight loss, fever, chills, recent viral illnesses, chest pain, shortness of breath,  cough, nausea, vomiting, diarrhea, constipation, abdominal pain, dysuria, any genitourinary complaints, lower extremity edema, palpitations, renal problems.  FAMILY HISTORY:  Her mother is living and has a history of cancer as well as diabetes mellitus and diabetic renal insufficiency.  She has one brother and one sister who are both living and have diabetes.  Her grandmother had history of coronary artery disease and diabetes mellitus.  SOCIAL HISTORY:  She is married and lives in [place] with her husband. She has two children.  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  She denies any alcohol use.  She has smoked anywhere from one-half to one pack of cigarettes daily x 45-50 years.  She is currently down to approximately eight to ten cigarettes daily.  PHYSICAL EXAMINATION:  VITAL SIGNS:  Blood pressure 134/68, pulse 58 and regular, respirations 16 and unlabored.  GENERAL:  She is a well-developed, well-nourished white female in no acute distress.  HEENT:  Normocephalic, atraumatic.  Pupils equal, round, and reactive to light and accommodation.  Extraocular movements intact.  Sclerae nonicteric.  TMs and canals clear.  Nares patent bilaterally.  Oropharynx is clear with moist mucous membranes.  Teeth are in good repair.  NECK:  Supple without lymphadenopathy or thyromegaly.  No carotid bruits.  LUNGS:  Clear to auscultation.  HEART:  Regular rate and rhythm without murmurs, rubs, or gallops.  ABDOMEN:  Soft, nontender, nondistended, with active bowel sounds in all quadrants.  EXTREMITIES:  No edema.  There is some mild cyanosis of the plantar surface of her right foot after her foot has been in a dependent position for a short time.  Her right foot is somewhat cooler than the left to about mid calf.  Her peripheral pulses are all dopplerable bilaterally.   NEUROLOGIC:  She is alert and oriented x 3.  Cranial nerves II-XII  grossly intact.  Gait is within normal limits.  Motor and sensory are intact bilaterally and symmetrical.  ASSESSMENT AND PLAN:  This is a 74 year old white female with symptoms of right lower extremity claudication who is scheduled to undergo a right leg arteriogram with probable PTA of the right superficial femoral artery by Dr. Arbie Cookey on December 02, 2001. Dictated by:   Tollie Pizza Collins, P.A.-C. Attending Physician:  Alyson Locket DD:  11/30/01 TD:  11/30/01 Job: [id number]

## 2011-03-29 NOTE — Cardiovascular Report (Signed)
NAME:  Pinkley, Yesli                    ACCOUNT NO.:  [id number]   MEDICAL RECORD NO.:  [id number]          PATIENT TYPE:  OIB   LOCATION:  2856                         FACILITY:  [facility]   PHYSICIAN:  Colleen Can. Deborah Chalk, M.D.DATE OF BIRTH:  June 08, 1936   DATE OF PROCEDURE:  01/02/2005  DATE OF DISCHARGE:                              CARDIAC CATHETERIZATION   PROCEDURE:  Pulse generator change out secondary to end-of-life.   PROCEDURE IN DETAIL:  The left subclavicular area was prepped and draped.  The area was infiltrated with 1% Xylocaine.  The old pulse generator was  located and explanted using sharp and Bovie dissection.  The old pulse  generator that was explanted was a Investment banker, operational DR model  2360L serial [id number] implanted on Mar 31, 1997.   The new pacemaker was a Medtronic bipolar pacemaker model [id number] serial  [id number].  Ventricular lead is a Pacesetter bipolar screw-in lead model  13ADAT serial [id number].  Date of implant is Mar 31, 1997.  The atrial lead  is a Pacesetter bipolar screw-in lead model 1388T serial [id number] implanted  on Mar 31, 1997.  The lead measurements are as follows with R-waves being  30.0 millivolts.  Lead impedance measures 420 ohms and the ventricular  capture threshold is 1.2 volts with a current of 3.5 milliamps at 0.5  millisecond pulse width.  The P-waves were 2.2 millivolts.  Lead impedance  measured 392 ohms and the atrial capture threshold is 1.2 volts with a  current of 3.5 milliamps at 0.5 millisecond pulse width.  The new pulse  generator was connected to the leads.  The wound was flushed with kanamycin  solution both before and after the new pulse generator was placed in the  pocket.  The pocket was enlarged somewhat to allow for adequate blood supply  to minimize any risk of infection.  The pulse generator set nicely in the  pocket.  The wound was closed with 2-0 and subsequently 4-0 Vicryl.  Steri-  Strips were applied.  The  patient tolerated the procedure well.      SNT/MEDQ  D:  01/02/2005  T:  01/02/2005  Job:  [id number]

## 2011-03-29 NOTE — H&P (Signed)
NAME:  [name] [name], [name] [name]                              ACCOUNT NO.:  [id number]   MEDICAL RECORD NO.:  [id number]                   PATIENT TYPE:  INP   LOCATION:  1827                                 FACILITY:  [facility]   PHYSICIAN:  Genene Churn. Love, M.D.                 DATE OF BIRTH:  June 07, 1936   DATE OF ADMISSION:  08/25/2003  DATE OF DISCHARGE:                                HISTORY & PHYSICAL   HISTORY:  This is a 75 year old right-handed white married female seen in  the emergency room and admitted for evaluation of weakness and vertigo.   HISTORY OF PRESENT ILLNESS:  [name] [name] has a 75 year old history of  hypertension and a 25 years ago had a Bell's palsy on one side of her face.  She has had a history of vertigo characterized by spinning. This day, about  12:30, while eating lunch she noted the onset of spinning unassociated with  nausea and vomiting. She has had a history of decreased of hearing and a  history of pain in her left ear. She noticed generalized weakness all over  and was brought to the emergency room. She was unable to move her arms and  legs at the scene. Was able to move her arms and legs in the emergency room.  She had some headache en route in the ambulance. There was no history of  chest pain, double vision, swallowing problems, slurred speech, blackout  spells, or seizures. There was no loss of consciousness. She does not take  aspirin because of a history of GI upset, bleeding, and easy bruisability.  In the ER a CT scan of the brain showed some atrophy in the left hemisphere  versus the right and mild right brain small vessel ischemic disease and  calcification of the basil ganglia.   PAST MEDICAL HISTORY:  1. Mediastinal lymphadenopathy, which is benign as of September 2004.  2. Hypertension for 30 years.  3. Asthma.  4. Pacemaker for a tachy-brady syndrome.  5. Left and right knee arthroscopy.  6. Appendectomy.  7. Tonsillectomy.  8. Partial  hysterectomy.  9. Cardiomegaly with IHSS.  10.      Gastroesophageal reflux disease.  11.      Hiatal hernia.   MEDICATIONS:  1. Captopril 50 mg q.i.d.  2. Atenolol 100 mg b.i.d.  3. Hydrochlorothiazide 25 mg daily.  4. Crestor dosage unknown.  5. Amiodarone 100 mg daily.  6. CPBX/Amitriptyline 5/12.5 mg b.i.d.   ALLERGIES:  Penicillin, sulfa, Verapamil, and nitroglycerin.   SOCIAL HISTORY:  She is married and has two children, daughters 33 and 7,  living and well.   FAMILY HISTORY:  Mother is 59, living. Father died at 23 unknown causes. She  has a brother 34. Sisters 61 and 26, living and well. She has one daughter  73 and one daughter 71 who are adopted.  PHYSICAL EXAMINATION:  GENERAL:  Well-developed white female. Alert and  oriented x3.  VITAL SIGNS:  Blood pressure right arm 210/110, left arm 200/90. Heart rate  64, no bruits. She is afebrile.  HEENT:  Cranial nerve examination revealed her pupils are reactive from 6 to  4 bilaterally. Corneals are present. There was no seventh nerve palsy.  Tongue was midline. The uvula was midline. Gag was present. There was no  dysarthria, there was no nystagmus. Red lens testing was negative. Hearing  was intact. Air conduction was greater than bone conduction.  MOTOR:  She could move all of her extremities versus gravity with at least  4/5 giving way in the upper and lower extremities. She could perform finger-  to-nose. She could perform heel-to-shin.  SENSORY:  Intact to pinprick touch, position, and vibration. Deep tendon  reflexes were hyperactive at 3+ range. Plantar responses were downgoing.  General examination revealed membranes to be clear. She had a scar in her  suprasternal notch.  HEART:  No definite murmurs.  ABDOMEN:  Bowel sounds were normal. There is no enlargement of the liver,  spleen, or kidneys.  EXTREMITIES:  There was no cyanosis, clubbing, or edema.   LABORATORY DATA:  A CT scan showed old right  hemisphere small vessel  disease, stroke. There was some atrophy in the left hemisphere greater than  the right. There was calcification of the basil ganglia. Sodium 139,  potassium 3.1, chloride 100, CO2 content 28, BUN 13, creatinine 0.8. Glucose  117. White blood cell count 7200, hemoglobin 15.5, hematocrit 44.2.  Platelets 206,000. PT 13.4, PTT 33. EKG has not returned at this time.   IMPRESSION:  1. Vertigo, code 780.4.  2. 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  3. Rule out brain stem stroke, code 43.1.  4. Hypertension, code 796.2.  5. IHSS, code 424.1.  6. Pacemaker for tachy-brady syndrome, code 429.2.  7. Asthma, code 493.9.  8. Chronic obstructive pulmonary disease, code 496.  9. Gastroesophageal reflux disease.  10.      History of hiatal hernia.  11.      History of hypokalemia, code 276.8.  12.      Benign lymph node enlargement.   PLAN:  Admit the patient for aspirin suppository and telemetry. Cardiac  isoenzymes will be obtained.                                                Genene Churn. Sandria Manly, M.D.    JML/MEDQ  D:  08/25/2003  T:  08/25/2003  Job:  [id number]

## 2011-03-29 NOTE — Op Note (Signed)
[place]  Patient:    [name] [name], [name] [name] Visit Number: [id number] MRN: [id number]          Service Type: DSU Location: [facility] 2867 01 Attending Physician:  Alyson Locket Dictated by:   [name], M.D. Proc. Date: 11/18/01 Admit Date:  11/18/2001 Discharge Date: 11/18/2001   CC:         Melvyn Neth D. Clovis Riley, MD  Peripheral vascular cath lab   Operative Report  PREOPERATIVE DIAGNOSIS:  Severe right leg claudication.  POSTOPERATIVE DIAGNOSIS:  Severe right leg claudication.  OPERATION PERFORMED:  Aortogram with bilateral lower extremity run-off.  SURGEON:  [name], M.D.  ANESTHESIA:  1% lidocaine local, 2 mg IV Versed sedation.  COMPLICATIONS:  None.  DISPOSITION:  To holding area stable.  INDICATIONS FOR PROCEDURE:  The patient is a 75 year old white female with progressively severe right calf claudication.  This is extremely debilitating and she is unable to participate in her usual activities due to this.  Her noninvasive studies suggest that she in all likelihood had stenosis in either the iliac or superficial femoral artery systems and it was recommended that she undergo arteriography for further definition or this.  DESCRIPTION OF PROCEDURE:  The patient was taken to the peripheral vascular cath lab and placed in supine position where the area of both groins was prepped and draped in the usual sterile fashion.  Using local anesthesia and a single wall puncture, the right common femoral artery was entered and the guide wire was passed up the level of the superrenal aorta.  A 5 French sheath was passed over the guide wire and a pigtail catheter was passed to the level of the superrenal aorta.  AP projections were undertaken.  This revealed widely patent single renal arteries bilaterally.  The patient had normal caliber aorta and iliac system with no evidence of stenoses.  The pigtail catheter was then withdrawn down to the  level of the aortic bifurcation and serial run off to the level of the foot was undertaken.  Again there was no evidence of significant stenosis in the iliac systems bilaterally.  The internal and external iliac arteries were widely patent.  The profunda femoris arteries were widely patent as were the proximal superficial femoral arteries. There was mild irregularity throughout the superficial femoral arteries bilaterally but no flow limiting stenosis at the adductor canal.  On the right there was a subtotal occlusion of the superficial femoral artery over a segment of approximately 1 cm.  Popliteal arteries were widely patent.  There was some irregularity in the take off of the anterior tibial artery on right. Otherwise there was normal three-vessel run-off bilaterally.  The patient tolerated the procedure without immediate complication and was transferred to the holding area where the sheath was removed. Dictated by:   [name], M.D. Attending Physician:  Alyson Locket DD:  11/18/01 TD:  11/18/01 Job: [id number]

## 2011-03-29 NOTE — Discharge Summary (Signed)
NAME:  [name] [name], [name] [name]                    ACCOUNT NO.:  [id number]   MEDICAL RECORD NO.:  [id number]          PATIENT TYPE:  IPS   LOCATION:  0501                          FACILITY:  [facility]   PHYSICIAN:  Jasmine Pang, M.D. DATE OF BIRTH:  April 27, 1936   DATE OF ADMISSION:  08/09/2006  DATE OF DISCHARGE:  08/10/2006                                 DISCHARGE SUMMARY   IDENTIFICATION:  This is a 75 year old married Caucasian female who was  admitted on a voluntary basis on August 09, 2006. The patient presented  on the day before admission to the emergency room at [facility].  She reported that she was having increased anxiety and suicidal thoughts.  She was quite tearful on arrival. She was evaluated. She was noted to have  benzodiazepines in her urine.  She was not noted to have any alcohol in her  system.  The remainder of her labs were within normal limits.  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  She is approaching her 50th wedding anniversary and her mother who is 45 has  physically been in her home for over 3 years now.  She has begun to feel  overwhelmed with all the people that are coming into her home because of her  mother.  There is a woman that comes Monday through Friday for 6 or 7 hours  a day. Whenever she goes to church, she has to hire a Arts administrator.  Her  younger sister, who lives in [place], has physically taken her  mother to her home and is arranging to have all CAP Services transferred.  Today [name] [name] reports that her private physician, Dr. Clovis Riley, had started  her on Celexa approximately 5 days ago.  She is tolerating this better than  the Cymbalta. Another physician who was treating her back pain had put her  on Cymbalta. She states that she is not too comfortable in the environment  here at [facility].  She is agreeable to coming back in the  morning and going to IOP. Since apparently  she went to the hospital at the  suggestion of a friend and had already signed a 72-hour discharge, it was  decided we would go ahead and release her to her husband and make her  arrangements to come back to IOP.   PHYSICAL FINDINGS:  Physical exam was done in the ED.  There were no acute  medical problems.   ADMISSION LABORATORIES:  TSH was 2.228 which was within normal limits,  otherwise other labs were done in the hospital in the ED.   HOSPITAL COURSE:  As indicated above, the patient began to feel  uncomfortable being in the [facility].  She felt that she had  not been severe enough to warrant admission to our unit.  She was not  suicidal or homicidal.  She was not psychotic.  She did not have any  auditory or visual hallucinations.  She had no paranoia or delusions.  It  was  felt she would be safe to return to IOP the next day. In addition her  stressors including the care of her mother were being taken over by other  family members and this had been one of her biggest problems.   DISCHARGE DIAGNOSES:  AXIS I:  Depressive disorder not otherwise specified.  Anxiety disorder not otherwise specified.  AXIS II:  None.  AXIS III:  Atrial fibrillation, hypertension, shoulder pain.  She has a  pacemaker.  AXIS IV:  Severe (was recently caring for an infirm and elderly mother).  AXIS V:  GAF upon discharge was 50.  GAF upon admission was 30.  GAF highest  past year was 75.   DISCHARGE/PLAN:  There were no specific activity level or dietary  restrictions.  Patient is to continue her home medication regimen of:   1. Citalopram 20 mg daily.  2. Amiodarone 200 mg one-half b.i.d.  3. Amitriptyline.  4. Aspirin daily.  5. Atenolol 100 mg t.i.d.  6. Captopril 50 mg 4 times daily.  7. Gabapentin 100 mg t.i.d.  8. Hydrochlorothiazide 25 mg daily.  9. Alprazolam 25 mg p.r.n.  10.Methocarbamol and aspirin 500 mg p.o. t.i.d.  11.Doxazosin 2 mg in the p.m.  12.Diovan 160 mg  p.o. daily.   POST HOSPITAL CARE PLANS:  As indicated above, the patient will return on  the day after admission to begin the intensive outpatient program in our  hospital.      Jasmine Pang, M.D.  Electronically Signed     BHS/MEDQ  D:  09/25/2006  T:  09/25/2006  Job:  [id number]

## 2011-03-29 NOTE — Op Note (Signed)
NAME:  [name], [name] [name]                    ACCOUNT NO.:  [id number]   MEDICAL RECORD NO.:  [id number]          PATIENT TYPE:  OIB   LOCATION:  2858                         FACILITY:  [facility]   PHYSICIAN:  Cassell Clement, M.D. DATE OF BIRTH:  07-05-1936   DATE OF PROCEDURE:  10/25/2005  DATE OF DISCHARGE:                                 OPERATIVE REPORT   PROCEDURE:  DC cardioversion.   HISTORY:  This 75 year old female with a dual chamber pacemaker was noted to  have gone into atrial fibrillation at the time of a routine pacemaker check.  She was placed on Coumadin and has remained in atrial flutter/fibrillation  and comes in now for elective cardioversion.  After suitable intravenous  anesthesia by Dr. Jacklynn Bue, the patient was given a single 50 watt second  shock using the AP pads and converted promptly to an AV paced rhythm.  There  were no post anesthetic complications.  The [employer] representative was  present pre- and post procedure to interrogate the pacemaker.  The pacemaker  is working well following the procedure.  The patient will be continued on  her same medications and will be followed up in the office in one week.           ______________________________  Cassell Clement, M.D.     TB/MEDQ  D:  10/25/2005  T:  10/28/2005  Job:  [id number]

## 2011-03-29 NOTE — Op Note (Signed)
NAME:  [name] [name], [name] [name]                    ACCOUNT NO.:  [id number]   MEDICAL RECORD NO.:  [id number]          PATIENT TYPE:  OIB   LOCATION:  2899                         FACILITY:  [facility]   PHYSICIAN:  Cassell Clement, M.D. DATE OF BIRTH:  February 19, 1936   DATE OF PROCEDURE:  01/16/2007  DATE OF DISCHARGE:                               OPERATIVE REPORT   [name] is brought to the [facility] unit for electrical  cardioversion.  She has a history of recurrent atrial fibrillation and  has been anticoagulated with Coumadin satisfactorily.  She has failed to  convert on outpatient antiarrhythmic therapy.  After suitable IV  anesthesia by Dr. Katrinka Blazing, the patient was given a single shock of 150  joules using the AP paddles.  She converted promptly to normal sinus  rhythm with occasional PACs and PVCs.  The [employer] representative, Mr.  Luberta Robertson, was also in attendance and evaluated her permanent pacemaker  before and after cardioversion.  We increased her pacing rate from 60 to  70 in effort to use some degree of overdrive to suppress ectopy.  The  patient tolerated the procedure well.  There were no post-anesthetic  complications.  The patient will be followed up at her regular visit in  our office in 5 days.           ______________________________  Cassell Clement, M.D.     TB/MEDQ  D:  01/16/2007  T:  01/16/2007  Job:  [id number]   cc:   Payton Doughty, M.D.  Cassell Clement, M.D.  Katrinka Blazing, M.D.

## 2011-04-01 ENCOUNTER — Other Ambulatory Visit: Payer: Self-pay | Admitting: *Deleted

## 2011-04-01 MED ORDER — DILTIAZEM HCL 120 MG PO TABS: 120.0000 mg | ORAL_TABLET | Freq: Every day | ORAL | Status: DC

## 2011-04-01 NOTE — Telephone Encounter (Signed)
Refilled diltiazem by fax

## 2011-04-12 ENCOUNTER — Encounter: Payer: Medicare Other | Admitting: *Deleted

## 2011-04-12 ENCOUNTER — Other Ambulatory Visit: Payer: Medicare Other | Admitting: *Deleted

## 2011-04-15 ENCOUNTER — Other Ambulatory Visit: Payer: Medicare Other | Admitting: *Deleted

## 2011-04-15 ENCOUNTER — Other Ambulatory Visit: Payer: Self-pay | Admitting: *Deleted

## 2011-04-15 ENCOUNTER — Encounter: Payer: Medicare Other | Admitting: *Deleted

## 2011-04-15 DIAGNOSIS — Z79899 Other long term (current) drug therapy: Secondary | ICD-10-CM

## 2011-04-16 ENCOUNTER — Ambulatory Visit (INDEPENDENT_AMBULATORY_CARE_PROVIDER_SITE_OTHER): Payer: Medicare Other | Admitting: *Deleted

## 2011-04-16 ENCOUNTER — Other Ambulatory Visit (INDEPENDENT_AMBULATORY_CARE_PROVIDER_SITE_OTHER): Payer: Medicare Other | Admitting: *Deleted

## 2011-04-16 DIAGNOSIS — I4891 Unspecified atrial fibrillation: Secondary | ICD-10-CM

## 2011-04-16 DIAGNOSIS — Z79899 Other long term (current) drug therapy: Secondary | ICD-10-CM

## 2011-04-16 LAB — BASIC METABOLIC PANEL
BUN: 15 mg/dL (ref 6–23)
CO2: 30 mEq/L (ref 19–32)
Chloride: 99 mEq/L (ref 96–112)
Creatinine, Ser: 0.8 mg/dL (ref 0.4–1.2)

## 2011-04-16 LAB — POCT INR: INR: 2.6

## 2011-04-17 ENCOUNTER — Telehealth: Payer: Self-pay | Admitting: *Deleted

## 2011-04-17 NOTE — Telephone Encounter (Signed)
Notified of lab results. 

## 2011-04-17 NOTE — Telephone Encounter (Signed)
Message copied by Lorayne Bender on Wed Apr 17, 2011  2:23 PM ------      Message from: Cassell Clement      Created: Wed Apr 17, 2011  1:32 PM       The potassium is normal now.  The blood sugar is still high.  Watch carbohydrates carefully

## 2011-04-24 ENCOUNTER — Ambulatory Visit (INDEPENDENT_AMBULATORY_CARE_PROVIDER_SITE_OTHER): Payer: Medicare Other | Admitting: *Deleted

## 2011-04-24 DIAGNOSIS — I4891 Unspecified atrial fibrillation: Secondary | ICD-10-CM

## 2011-05-08 ENCOUNTER — Ambulatory Visit (INDEPENDENT_AMBULATORY_CARE_PROVIDER_SITE_OTHER): Payer: Medicare Other | Admitting: *Deleted

## 2011-05-08 DIAGNOSIS — I4891 Unspecified atrial fibrillation: Secondary | ICD-10-CM

## 2011-05-10 ENCOUNTER — Ambulatory Visit: Payer: Medicare Other | Attending: Neurology | Admitting: Rehabilitative and Restorative Service Providers"

## 2011-05-10 DIAGNOSIS — IMO0001 Reserved for inherently not codable concepts without codable children: Secondary | ICD-10-CM | POA: Insufficient documentation

## 2011-05-10 DIAGNOSIS — M2550 Pain in unspecified joint: Secondary | ICD-10-CM | POA: Insufficient documentation

## 2011-05-10 DIAGNOSIS — R269 Unspecified abnormalities of gait and mobility: Secondary | ICD-10-CM | POA: Insufficient documentation

## 2011-05-10 DIAGNOSIS — R42 Dizziness and giddiness: Secondary | ICD-10-CM | POA: Insufficient documentation

## 2011-05-10 DIAGNOSIS — M6281 Muscle weakness (generalized): Secondary | ICD-10-CM | POA: Insufficient documentation

## 2011-05-14 ENCOUNTER — Ambulatory Visit: Payer: Medicare Other | Attending: Neurology | Admitting: Rehabilitative and Restorative Service Providers"

## 2011-05-14 DIAGNOSIS — R269 Unspecified abnormalities of gait and mobility: Secondary | ICD-10-CM | POA: Insufficient documentation

## 2011-05-14 DIAGNOSIS — M2550 Pain in unspecified joint: Secondary | ICD-10-CM | POA: Insufficient documentation

## 2011-05-14 DIAGNOSIS — M6281 Muscle weakness (generalized): Secondary | ICD-10-CM | POA: Insufficient documentation

## 2011-05-14 DIAGNOSIS — IMO0001 Reserved for inherently not codable concepts without codable children: Secondary | ICD-10-CM | POA: Insufficient documentation

## 2011-05-14 DIAGNOSIS — R42 Dizziness and giddiness: Secondary | ICD-10-CM | POA: Insufficient documentation

## 2011-05-16 ENCOUNTER — Ambulatory Visit: Payer: Medicare Other | Admitting: Physical Therapy

## 2011-05-17 ENCOUNTER — Telehealth: Payer: Self-pay | Admitting: *Deleted

## 2011-05-17 ENCOUNTER — Encounter: Payer: Medicare Other | Admitting: *Deleted

## 2011-05-17 NOTE — Telephone Encounter (Signed)
Received a call and fax from [facility] nurse.  When spoke with patient she stated her shortness of breath had gotten worse, did advise to call pulmonologist.  Outside of that she doesn't think she feels much different since last ov.  Did not take Lasix today and blood pressure 102, so she will hold.  Advised if continues to be low, to call back and let us know.  Also the dizziness comes and goes and has since the fall.  Has not noticed any increased edema and weight is stable.  Did discuss with Dr Elease Hashimoto, no changes.  Patient seemed good with instructions given

## 2011-05-20 ENCOUNTER — Ambulatory Visit: Payer: Medicare Other | Admitting: Rehabilitative and Restorative Service Providers"

## 2011-05-21 ENCOUNTER — Other Ambulatory Visit: Payer: Self-pay | Admitting: *Deleted

## 2011-05-21 DIAGNOSIS — F419 Anxiety disorder, unspecified: Secondary | ICD-10-CM

## 2011-05-21 NOTE — Telephone Encounter (Signed)
Fax received from pharmacy/ called in. Refill completed. Alfonso Ramus RN

## 2011-05-22 ENCOUNTER — Encounter: Payer: Medicare Other | Admitting: *Deleted

## 2011-05-22 MED ORDER — ALPRAZOLAM 0.25 MG PO TABS: 0.2500 mg | ORAL_TABLET | Freq: Three times a day (TID) | ORAL | Status: DC | PRN

## 2011-05-22 NOTE — Telephone Encounter (Signed)
Agree with above 

## 2011-05-24 ENCOUNTER — Ambulatory Visit: Payer: Medicare Other | Admitting: Rehabilitative and Restorative Service Providers"

## 2011-05-24 ENCOUNTER — Encounter: Payer: Medicare Other | Admitting: *Deleted

## 2011-05-27 ENCOUNTER — Ambulatory Visit (INDEPENDENT_AMBULATORY_CARE_PROVIDER_SITE_OTHER): Payer: Medicare Other | Admitting: *Deleted

## 2011-05-27 ENCOUNTER — Ambulatory Visit: Payer: Medicare Other | Admitting: Rehabilitative and Restorative Service Providers"

## 2011-05-27 ENCOUNTER — Other Ambulatory Visit: Payer: Self-pay | Admitting: *Deleted

## 2011-05-27 DIAGNOSIS — I4891 Unspecified atrial fibrillation: Secondary | ICD-10-CM

## 2011-05-27 LAB — POCT INR: INR: 2.4

## 2011-05-30 ENCOUNTER — Other Ambulatory Visit: Payer: Self-pay | Admitting: Internal Medicine

## 2011-05-30 ENCOUNTER — Ambulatory Visit (INDEPENDENT_AMBULATORY_CARE_PROVIDER_SITE_OTHER): Payer: Medicare Other | Admitting: *Deleted

## 2011-05-30 ENCOUNTER — Ambulatory Visit: Payer: Medicare Other | Admitting: Rehabilitative and Restorative Service Providers"

## 2011-05-30 DIAGNOSIS — I422 Other hypertrophic cardiomyopathy: Secondary | ICD-10-CM

## 2011-05-30 DIAGNOSIS — I4891 Unspecified atrial fibrillation: Secondary | ICD-10-CM

## 2011-05-31 LAB — REMOTE PACEMAKER DEVICE
AL IMPEDENCE PM: 392 Ohm
BATTERY VOLTAGE: 2.86 V
BMOD-0002RV: 7
VENTRICULAR PACING PM: 59.5

## 2011-06-03 ENCOUNTER — Ambulatory Visit: Payer: Medicare Other | Admitting: Rehabilitative and Restorative Service Providers"

## 2011-06-05 ENCOUNTER — Encounter: Payer: Self-pay | Admitting: *Deleted

## 2011-06-05 ENCOUNTER — Ambulatory Visit: Payer: Medicare Other | Admitting: Rehabilitative and Restorative Service Providers"

## 2011-06-05 NOTE — Progress Notes (Signed)
Pacer remote check  

## 2011-06-11 ENCOUNTER — Ambulatory Visit: Payer: Medicare Other | Admitting: Rehabilitative and Restorative Service Providers"

## 2011-06-12 ENCOUNTER — Encounter: Payer: Medicare Other | Admitting: *Deleted

## 2011-06-12 ENCOUNTER — Encounter: Payer: Self-pay | Admitting: Cardiology

## 2011-06-12 ENCOUNTER — Ambulatory Visit (INDEPENDENT_AMBULATORY_CARE_PROVIDER_SITE_OTHER): Payer: Medicare Other | Admitting: Cardiology

## 2011-06-12 DIAGNOSIS — I4891 Unspecified atrial fibrillation: Secondary | ICD-10-CM

## 2011-06-12 DIAGNOSIS — W19XXXA Unspecified fall, initial encounter: Secondary | ICD-10-CM

## 2011-06-12 DIAGNOSIS — E119 Type 2 diabetes mellitus without complications: Secondary | ICD-10-CM

## 2011-06-12 DIAGNOSIS — F329 Major depressive disorder, single episode, unspecified: Secondary | ICD-10-CM

## 2011-06-12 DIAGNOSIS — I10 Essential (primary) hypertension: Secondary | ICD-10-CM

## 2011-06-12 NOTE — Assessment & Plan Note (Signed)
The patient has a history of situational depression which was precipitated by her husbands death.  Her symptoms of depression appear to be clearing with time

## 2011-06-12 NOTE — Assessment & Plan Note (Signed)
The patient has a problem with poor balance.  She has seen Dr. Anne Hahn who did not change any of her medicines but did prescribe physical therapy for balance.  She is undergoing physical therapy and it is Helping her

## 2011-06-12 NOTE — Assessment & Plan Note (Signed)
The patient has a history of essential hypertension.  She also has a history of hypertrophic myopathy.  Recently her blood pressure has been under good control and she's not having any dizziness or syncope.

## 2011-06-12 NOTE — Assessment & Plan Note (Signed)
The patient has a functioning pacemaker and has underlying atrial fibrillation.  She is on long-term Coumadin.  She anticipates having to have her remaining bottom teeth pulled by her oral surgeon Dr. Manson Passey.  We will want her to hold her warfarin for 4 days prior to the procedure and one day prior to the procedure we will cover her with Lovenox.

## 2011-06-12 NOTE — Progress Notes (Signed)
[name] [name] Date of Birth:  [birth year] [facility] [address]   [address] [place], [place]  [address] [phone]           Fax   [phone]  History of Present Illness: This pleasant 75 year old woman is seen for a scheduled 4 month followup office visit.  She has a complex past medical history she has a history of hypertrophic cardiomyopathy chronic atrial fibrillation functioning ventricular pacemaker pulmonary hypertension situational anxiety and depression hypercholesterolemia and osteoarthritis.  She does not have a history of ischemic heart disease.  He had a Cardiolite stress test on 08/01/09 which was negative for reversible ischemia.  He does have pulmonary hypertension and had a right heart catheterization on 08/28/09 by Dr. Gala Romney who did not feel that she would benefit from pulmonary vasodilators.  A permanent pacemaker is followed by Dr. Graciela Husbands.  She was recently found to have diabetes and Dr. Clovis Riley has her on metformin.  She has a history of osteoarthritis and low back pain and is followed by Dr. Darrelyn Hillock  Current Outpatient Prescriptions  Medication Sig Dispense Refill  . ALPRAZolam (XANAX) 0.25 MG tablet Take 1 tablet (0.25 mg total) by mouth 3 (three) times daily as needed.  90 tablet  0  . Ascorbic Acid (VITAMIN C) 500 MG tablet Take 500 mg by mouth daily.        . bisoprolol (ZEBETA) 10 MG tablet Take 1 tablet (10 mg total) by mouth daily.  30 tablet  11  . calcium carbonate (OS-CAL) 600 MG TABS Take 600 mg by mouth daily.        . calcium carbonate (TUMS - DOSED IN MG ELEMENTAL CALCIUM) 500 MG chewable tablet Chew 1 tablet by mouth daily.        . Cholecalciferol (VITAMIN D) 2000 UNITS CAPS Take 1 capsule by mouth daily.        . clonazePAM (KLONOPIN) 0.5 MG tablet Take 0.5 mg by mouth 2 (two) times daily as needed.        . [employer] SALINE NASAL SPRAY NA by Nasal route. UAD       . Cyanocobalamin (VITAMIN B-12) 2500 MCG SUBL Place 1 tablet under  the tongue daily.        [name] diltiazem (CARDIZEM) 120 MG tablet Take 1 tablet (120 mg total) by mouth daily.  30 tablet  11  . famotidine (PEPCID) 20 MG tablet Take 20 mg by mouth 2 (two) times daily.        . furosemide (LASIX) 40 MG tablet Take 40 mg by mouth daily. Taking prn      . gabapentin (NEURONTIN) 300 MG capsule Take 300 mg by mouth. 1 in the am, 2 at lunch, 2 dinner and 2 at bedtime       . hydrochlorothiazide 25 MG tablet Take 25 mg by mouth daily.        [name] HYDROcodone-acetaminophen (NORCO) 10-325 MG per tablet Take 1 tablet by mouth every 6 (six) hours as needed.        [name] losartan (COZAAR) 100 MG tablet Take 1 tablet (100 mg total) by mouth daily.  30 tablet  11  . magnesium oxide (MAG-OX) 400 MG tablet Take 200 mg by mouth daily.       . metFORMIN (GLUCOPHAGE) 500 MG tablet Take 500 mg by mouth daily. Dr. Clovis Riley Rx       . potassium chloride SA (K-DUR,KLOR-CON) 20 MEQ tablet Take 1 tablet (20 mEq  total) by mouth daily.  90 tablet  3  . sertraline (ZOLOFT) 100 MG tablet Take 100 mg by mouth 2 (two) times daily.       . simvastatin (ZOCOR) 10 MG tablet Take 10 mg by mouth at bedtime.        [name] telmisartan (MICARDIS) 40 MG tablet Take 40 mg by mouth daily.        [name] triamcinolone (KENALOG) 0.1 % ointment Apply topically 2 (two) times daily.        [name] warfarin (COUMADIN) 5 MG tablet Take 5 mg by mouth daily. UAD         Allergies  Allergen Reactions  . Aspirin   . Calan (Verapamil Hcl)   . Crestor (Rosuvastatin Calcium)     Muscle pain  . Lexapro   . Lipitor (Atorvastatin Calcium)     myalgias  . Lopressor (Metoprolol Tartrate)   . Norvasc (Amlodipine Besylate)     fatigue  . Penicillins     REACTION: rash    Patient Active Problem List  Diagnoses  . HYPERLIPIDEMIA  . DEPRESSIVE DISORDER NOT ELSEWHERE CLASSIFIED  . HYPERTENSION  . ALLERGIC RHINITIS  . CHRONIC OBSTRUCTIVE PULMONARY DISEASE, MODERATE  . SHORTNESS OF BREATH  . COUGH  . Atrial fibrillation  . Pacemaker   . Hypertrophic cardiomyopathy  . Osteoarthritis  . Situational mixed anxiety and depressive disorder  . Hypercholesterolemia  . Mitral regurgitation  . Fall  . Diabetes mellitus    History  Smoking status  . Former Smoker  . Types: Cigarettes  . Quit date: 11/15/2005  Smokeless tobacco  . Never Used    History  Alcohol Use No    Family History  Problem Relation Age of Onset  . Other Father   . Cancer Mother     Review of Systems: Constitutional: no fever chills diaphoresis or fatigue or change in weight.  Head and neck: no hearing loss, no epistaxis, no photophobia or visual disturbance. Respiratory: No cough, shortness of breath or wheezing. Cardiovascular: No chest pain peripheral edema, palpitations. Gastrointestinal: No abdominal distention, no abdominal pain, no change in bowel habits hematochezia or melena. Genitourinary: No dysuria, no frequency, no urgency, no nocturia. Musculoskeletal:No arthralgias, no back pain, no gait disturbance or myalgias. Neurological: No dizziness, no headaches, no numbness, no seizures, no syncope, no weakness, no tremors. Hematologic: No lymphadenopathy, no easy bruising. Psychiatric: No confusion, no hallucinations, no sleep disturbance.    Physical Exam: Filed Vitals:   06/12/11 1143  BP: 110/64  Pulse: 62  The general appearance reveals a well-developed well-nourished woman in no distress.  She appears to be less depressed than usual.  He's not having any significant dyspnea on room air and at her request we checked a oxygen saturation which showed oxygen saturation averaging about 94% at rest.Pupils equal and reactive.   Extraocular Movements are full.  There is no scleral icterus.  The mouth and pharynx are normal.  The neck is supple.  The carotids reveal no bruits.  The jugular venous pressure is normal.  The thyroid is not enlarged.  There is no lymphadenopathy.  The chest is clear to percussion and auscultation. There  are no rales or rhonchi. Expansion of the chest is symmetrical.  The precordium is quiet.  The first heart sound is normal.  The second heart sound is physiologically split.  There is no murmur gallop rub or click.  There is no abnormal lift or heave.  The abdomen is soft and nontender.  Bowel sounds are normal. The liver and spleen are not enlarged. There Are no abdominal masses. There are no bruits.  The pedal pulses are good.  There is no phlebitis or edema.  There is no cyanosis or clubbing.There is trace edema and she does have superficial varicose veins.Strength is normal and symmetrical in all extremities.  There is no lateralizing weakness.  There are no sensory deficits.  The skin is warm and dry.  There is no rash.       Assessment / Plan: Continue same medication.  Recheck in 4 months for a followup office visit.

## 2011-06-12 NOTE — Assessment & Plan Note (Signed)
Since we last saw her Dr. Clovis Riley has placed her on metformin for her diabetes.  She's not having any hypoglycemic episodes.

## 2011-06-14 ENCOUNTER — Ambulatory Visit: Payer: Medicare Other | Attending: Neurology | Admitting: Rehabilitative and Restorative Service Providers"

## 2011-06-14 DIAGNOSIS — R269 Unspecified abnormalities of gait and mobility: Secondary | ICD-10-CM | POA: Insufficient documentation

## 2011-06-14 DIAGNOSIS — R42 Dizziness and giddiness: Secondary | ICD-10-CM | POA: Insufficient documentation

## 2011-06-14 DIAGNOSIS — IMO0001 Reserved for inherently not codable concepts without codable children: Secondary | ICD-10-CM | POA: Insufficient documentation

## 2011-06-14 DIAGNOSIS — M6281 Muscle weakness (generalized): Secondary | ICD-10-CM | POA: Insufficient documentation

## 2011-06-19 ENCOUNTER — Encounter: Payer: Medicare Other | Admitting: Rehabilitative and Restorative Service Providers"

## 2011-06-21 ENCOUNTER — Encounter: Payer: Medicare Other | Admitting: Rehabilitative and Restorative Service Providers"

## 2011-06-22 ENCOUNTER — Other Ambulatory Visit (HOSPITAL_COMMUNITY): Payer: Self-pay | Admitting: Family Medicine

## 2011-06-22 ENCOUNTER — Ambulatory Visit (HOSPITAL_COMMUNITY)
Admission: RE | Admit: 2011-06-22 | Discharge: 2011-06-22 | Disposition: A | Discharge status: Home / Self Care | Payer: Medicare Other | Source: Ambulatory Visit | Attending: Family Medicine | Admitting: Family Medicine

## 2011-06-22 ENCOUNTER — Emergency Department (HOSPITAL_COMMUNITY)
Admission: EM | Admit: 2011-06-22 | Discharge: 2011-06-22 | Disposition: A | Discharge status: Other Acute Inpatient Hospital | Payer: Medicare Other | Attending: Emergency Medicine | Admitting: Emergency Medicine

## 2011-06-22 DIAGNOSIS — S0003XA Contusion of scalp, initial encounter: Secondary | ICD-10-CM | POA: Insufficient documentation

## 2011-06-22 DIAGNOSIS — S1093XA Contusion of unspecified part of neck, initial encounter: Secondary | ICD-10-CM | POA: Insufficient documentation

## 2011-06-22 DIAGNOSIS — W19XXXA Unspecified fall, initial encounter: Secondary | ICD-10-CM | POA: Insufficient documentation

## 2011-06-22 DIAGNOSIS — R51 Headache: Secondary | ICD-10-CM | POA: Insufficient documentation

## 2011-06-24 ENCOUNTER — Ambulatory Visit (INDEPENDENT_AMBULATORY_CARE_PROVIDER_SITE_OTHER): Payer: Medicare Other | Admitting: *Deleted

## 2011-06-24 DIAGNOSIS — I4891 Unspecified atrial fibrillation: Secondary | ICD-10-CM

## 2011-06-24 LAB — POCT INR: INR: 1.8

## 2011-06-25 ENCOUNTER — Telehealth: Payer: Self-pay | Admitting: Cardiology

## 2011-06-25 NOTE — Telephone Encounter (Signed)
We will discontinue her Coumadin secondary to multiple Falls.She remains at high risk for further falls and further potential head trauma

## 2011-06-25 NOTE — Telephone Encounter (Signed)
Message copied by Cassell Clement on Tue Jun 25, 2011  4:33 PM ------      Message from: Velda Shell      Created: Tue Jun 25, 2011 11:03 AM       Pt was seen yesterday in Coumadin clinic.  She fell last week and has large bruises on her face.  Was seen by Dr. Clovis Riley and CT was negative.  Pt mentioned she has fallen multiple times in recent past.  On multiple BP meds. May need f/u with you or PA soon to evaluate falls and if she should be on Coumadin.             Thanks,       Kennon Rounds

## 2011-06-25 NOTE — Telephone Encounter (Signed)
She should be on an 81 mg aspirin daily.  To keep her blood pressure from being too low she should stop her HCTZ and decrease her furosemide to just 20 mg daily instead of 40

## 2011-06-25 NOTE — Telephone Encounter (Signed)
Advised to discontinue coumadin. 1- Should she be on an ASA?  2-not taking Micardis, please advise on blood pressure medications.

## 2011-06-25 NOTE — Telephone Encounter (Signed)
Called because she has taken several falls since February and and she took a really bad fall this past Friday and hit her head really hard.  She is concerned about her blood pressure getting too low and she was wondering if she could change one of her blood pressure medications to see if there is a difference. Please call back. I have pulled her chart.

## 2011-06-26 NOTE — Telephone Encounter (Signed)
Left message

## 2011-06-26 NOTE — Telephone Encounter (Signed)
Advised patient of medication changes.  As for pain in her head continuing, advised to continue to apply ice and if continues may need to be revaluated.   Dr. Patty Sermons reviewed ct of head.

## 2011-07-01 ENCOUNTER — Ambulatory Visit: Payer: Self-pay | Admitting: Cardiology

## 2011-07-04 ENCOUNTER — Telehealth: Payer: Self-pay | Admitting: Cardiology

## 2011-07-04 NOTE — Telephone Encounter (Signed)
Pt was seen at PCP and told to call her cardiologist to set up an appt.  She had blood work done as well as a Radio broadcast assistant.  Chest Xray showed fluid in the lungs.  Pt also states she was taken off of her fluid pill Lasix a while back and her PCP told her to take a whole one and wants to know if this has anything to do with the fluid in her lungs.  Unable to schedule pt until November on Dr. Yevonne Pax schedule.  Please call pt back for medical advice and to get pt in sooner for an appt per her PCP.  Chart in box.

## 2011-07-04 NOTE — Telephone Encounter (Signed)
Scheduled appointment with Lawson Fiscal for tomorrow.  Dr Clovis Riley did advise her to take 40 mg of Lasix today.  HCTZ was discontinued not Lasix

## 2011-07-05 ENCOUNTER — Encounter: Payer: Self-pay | Admitting: Nurse Practitioner

## 2011-07-05 ENCOUNTER — Ambulatory Visit (INDEPENDENT_AMBULATORY_CARE_PROVIDER_SITE_OTHER): Payer: Medicare Other | Admitting: Nurse Practitioner

## 2011-07-05 VITALS — BP 130/76 | Wt 154.0 lb

## 2011-07-05 DIAGNOSIS — W19XXXA Unspecified fall, initial encounter: Secondary | ICD-10-CM

## 2011-07-05 DIAGNOSIS — R609 Edema, unspecified: Secondary | ICD-10-CM

## 2011-07-05 DIAGNOSIS — R0602 Shortness of breath: Secondary | ICD-10-CM

## 2011-07-05 DIAGNOSIS — I4891 Unspecified atrial fibrillation: Secondary | ICD-10-CM

## 2011-07-05 MED ORDER — FUROSEMIDE 40 MG PO TABS: ORAL_TABLET | ORAL | Status: DC

## 2011-07-05 NOTE — Patient Instructions (Addendum)
I want you to take one and half tablets of your Lasix for the next 4 days We are going to repeat your ultrasound of your heart Depending on what that shows, we may get you back to the lung doctor Stay on your other medicines for now.  Download your pacemaker tonight

## 2011-07-05 NOTE — Progress Notes (Signed)
[name] [name] Date of Birth: 1936-06-26   History of Present Illness: [name] [name] is seen back today for a work in visit. She is seen for Dr. Patty Sermons. She remains short of breath. It has gotten worse. She has more edema and her weight has gone back up. She was seen by Dr. Clovis Riley. CXR showed mild CHF. She was put back on her Lasix. Lasix and HCTZ had previously been stopped due to dizziness and low blood pressure. Her dizziness continues. She continues to fall. Thankfully, she is no longer on coumadin. She fell two weeks ago and has a black left eye. She is worried her pacemaker is at [facility]. Dr. Graciela Husbands told her it would in October. No syncope that she is aware of. Last night she was very short of breath. She remains on her oxygen and is having to using it more. She remains depressed.   Current Outpatient Prescriptions on File Prior to Visit  Medication Sig Dispense Refill  . ALPRAZolam (XANAX) 0.25 MG tablet Take 1 tablet (0.25 mg total) by mouth 3 (three) times daily as needed.  90 tablet  0  . Ascorbic Acid (VITAMIN C) 500 MG tablet Take 500 mg by mouth daily.        . bisoprolol (ZEBETA) 10 MG tablet Take 1 tablet (10 mg total) by mouth daily.  30 tablet  11  . calcium carbonate (OS-CAL) 600 MG TABS Take 600 mg by mouth daily.        . calcium carbonate (TUMS - DOSED IN MG ELEMENTAL CALCIUM) 500 MG chewable tablet Chew 1 tablet by mouth daily.        . Cholecalciferol (VITAMIN D) 2000 UNITS CAPS Take 1 capsule by mouth daily.        . Cyanocobalamin (VITAMIN B-12) 2500 MCG SUBL Place 1 tablet under the tongue daily.        [name] diltiazem (CARDIZEM) 120 MG tablet Take 1 tablet (120 mg total) by mouth daily.  30 tablet  11  . famotidine (PEPCID) 20 MG tablet Take 20 mg by mouth 2 (two) times daily.        [name] gabapentin (NEURONTIN) 300 MG capsule Take 300 mg by mouth. 1 in the am, 2 at lunch, 2 dinner and 2 at bedtime       . HYDROcodone-acetaminophen (NORCO) 10-325 MG per tablet Take 1 tablet by mouth every  6 (six) hours as needed.        [name] losartan (COZAAR) 100 MG tablet Take 1 tablet (100 mg total) by mouth daily.  30 tablet  11  . magnesium oxide (MAG-OX) 400 MG tablet Take 200 mg by mouth daily.       . metFORMIN (GLUCOPHAGE) 500 MG tablet Take 500 mg by mouth daily. Dr. Clovis Riley Rx       . potassium chloride SA (K-DUR,KLOR-CON) 20 MEQ tablet Take 1 tablet (20 mEq total) by mouth daily.  90 tablet  3  . sertraline (ZOLOFT) 100 MG tablet Take 100 mg by mouth 2 (two) times daily.       . simvastatin (ZOCOR) 10 MG tablet Take 10 mg by mouth at bedtime.        [name] DISCONTD: furosemide (LASIX) 40 MG tablet Take 40 mg by mouth as directed.       . clonazePAM (KLONOPIN) 0.5 MG tablet Take 0.5 mg by mouth 2 (two) times daily as needed.        . [employer]S SALINE NASAL SPRAY NA by  Nasal route. UAD       . telmisartan (MICARDIS) 40 MG tablet Take 40 mg by mouth daily.        [name] triamcinolone (KENALOG) 0.1 % ointment Apply topically 2 (two) times daily.          Allergies  Allergen Reactions  . Aspirin   . Calan (Verapamil Hcl)   . Crestor (Rosuvastatin Calcium)     Muscle pain  . Lexapro   . Lipitor (Atorvastatin Calcium)     myalgias  . Lopressor (Metoprolol Tartrate)   . Norvasc (Amlodipine Besylate)     fatigue  . Penicillins     REACTION: rash    Past Medical History  Diagnosis Date  . Hypertrophic cardiomyopathy   . Hypertension   . Osteoarthritis   . Situational mixed anxiety and depressive disorder   . Hypercholesterolemia   . Atrial fibrillation     Chronic; No longer on coumadin  . Pacemaker   . Falls frequently     coumadin stopped  . Pulmonary HTN     has had prior right heart cath in 2010; was felt that most likely due to elevated left sided pressures and would not benefit from vasodilator therapy  . Diabetes mellitus   . Pacemaker   . Oxygen dependent     Past Surgical History  Procedure Date  . Knee arthroplasty 2011    right knee  . Tonsillectomy   . Appendectomy     . Cardiac catheterization   . Insert / replace / remove pacemaker   . Other surgical history     hysterectomy    History  Smoking status  . Former Smoker  . Types: Cigarettes  . Quit date: 11/15/2005  Smokeless tobacco  . Never Used    History  Alcohol Use No    Family History  Problem Relation Age of Onset  . Other Father   . Cancer Mother     Review of Systems: The review of systems is as above. She had labs 2 days ago. She says BNP was in the 200's.  All other systems were reviewed and are negative.  Physical Exam: BP 130/76  Wt 154 lb (69.854 kg)  SpO2 97% Patient is somewhat frail but in no acute distress. She has oxygen in place. Skin is warm and dry. Color is normal.  HEENT is unremarkable. Normocephalic/atraumatic. PERRL. Sclera are nonicteric. Neck is supple. No masses. No JVD. Lungs are clear. Cardiac exam shows an irregular rhythm. Rate is controlled. Soft outflow murmur noted. Abdomen is soft. Extremities are with about 1+ edema. She has multiple varicosities. Gait is a little unsteady. ROM is intact. No gross neurologic deficits noted.   LABORATORY DATA:   Assessment / Plan:

## 2011-07-05 NOTE — Assessment & Plan Note (Signed)
She has continued to fall. She is off coumadin.

## 2011-07-05 NOTE — Assessment & Plan Note (Signed)
This is probably multifactorial. She is to stay on her oxygen. I have increased her lasix to 60 mg for just 4 days, then back to 40 mg. She will download her pacemaker tonight. We will update the echo. She may need to go back and see pulmonary. Further disposition to follow. Patient is agreeable to this plan and will call if any problems develop in the interim.

## 2011-07-09 ENCOUNTER — Telehealth: Payer: Self-pay | Admitting: *Deleted

## 2011-07-09 ENCOUNTER — Encounter: Payer: Self-pay | Admitting: Cardiology

## 2011-07-09 NOTE — Telephone Encounter (Signed)
Message copied by Burnell Blanks on Tue Jul 09, 2011  9:15 AM ------      Message from: Rosalio Macadamia      Created: Tue Jul 09, 2011  7:46 AM       Juliette Alcide,      Will you let Ms. Briel know her pacer has NOT reached ERI yet.            Lawson Fiscal      ----- Message -----         From: Vella Kohler         Sent: 07/08/2011   4:28 PM           To: Rosalio Macadamia, NP            Received transmission. Battery voltage 2.86 V. ERI is 2.81 V.       Pt is in AF 100% of time but histogram is appropriate and V Rates are not high during AF.             Belenda Cruise            ----- Message -----         From: Rosalio Macadamia, NP         Sent: 07/05/2011   3:51 PM           To: Vella Kohler            Ms. Schifano is going to download her pacemaker tonight. Apparently she was told she was going to need a generator replacement in October. She is not feeling well and thinks it may be time.            Please review and let me know.            Lawson Fiscal

## 2011-07-09 NOTE — Telephone Encounter (Signed)
Advised patient

## 2011-07-10 ENCOUNTER — Ambulatory Visit (HOSPITAL_COMMUNITY): Payer: Medicare Other | Attending: Nurse Practitioner | Admitting: Radiology

## 2011-07-10 DIAGNOSIS — R0602 Shortness of breath: Secondary | ICD-10-CM

## 2011-07-10 DIAGNOSIS — I10 Essential (primary) hypertension: Secondary | ICD-10-CM | POA: Insufficient documentation

## 2011-07-10 DIAGNOSIS — R609 Edema, unspecified: Secondary | ICD-10-CM

## 2011-07-10 DIAGNOSIS — E119 Type 2 diabetes mellitus without complications: Secondary | ICD-10-CM | POA: Insufficient documentation

## 2011-07-10 DIAGNOSIS — I4891 Unspecified atrial fibrillation: Secondary | ICD-10-CM | POA: Insufficient documentation

## 2011-07-10 DIAGNOSIS — Z87891 Personal history of nicotine dependence: Secondary | ICD-10-CM | POA: Insufficient documentation

## 2011-07-10 DIAGNOSIS — I422 Other hypertrophic cardiomyopathy: Secondary | ICD-10-CM | POA: Insufficient documentation

## 2011-07-10 DIAGNOSIS — I079 Rheumatic tricuspid valve disease, unspecified: Secondary | ICD-10-CM | POA: Insufficient documentation

## 2011-07-10 DIAGNOSIS — I2789 Other specified pulmonary heart diseases: Secondary | ICD-10-CM | POA: Insufficient documentation

## 2011-07-12 ENCOUNTER — Telehealth: Payer: Self-pay | Admitting: Nurse Practitioner

## 2011-07-12 ENCOUNTER — Telehealth: Payer: Self-pay | Admitting: *Deleted

## 2011-07-12 NOTE — Telephone Encounter (Signed)
Pt wants to know her pacemaker check results please call

## 2011-07-12 NOTE — Telephone Encounter (Signed)
Advised of echo results and to call pulmonologist if continues to have problems.  Also PCP changed her zoloft to prozac

## 2011-07-12 NOTE — Telephone Encounter (Signed)
Advised of echo results 

## 2011-07-12 NOTE — Telephone Encounter (Signed)
Message copied by Burnell Blanks on Fri Jul 12, 2011  5:34 PM ------      Message from: Cassell Clement      Created: Thu Jul 11, 2011  5:39 PM       Please report.  The echocardiogram shows pulmonary hypertension but her right ventricular pressures have actually improved since a previous echo done on 07/24/09 when her right ventricular systolic pressure was 83.  On the more recent study her only pressure is down to 66.Her left ventricular function was satisfactory.   And she does have moderate left ventricular hypertrophy.Continue present medication and recheck at regular visit.

## 2011-07-12 NOTE — Telephone Encounter (Signed)
Wanted to know results of pacemaker reading. Advised to call the device clinic. Gave her the phone number. Also wanted to know results of doppler. [name] [name] would call her when gets results.

## 2011-07-22 ENCOUNTER — Encounter: Payer: Medicare Other | Admitting: *Deleted

## 2011-08-05 ENCOUNTER — Ambulatory Visit (INDEPENDENT_AMBULATORY_CARE_PROVIDER_SITE_OTHER): Payer: Medicare Other | Admitting: Cardiology

## 2011-08-05 ENCOUNTER — Encounter: Payer: Self-pay | Admitting: Cardiology

## 2011-08-05 ENCOUNTER — Telehealth: Payer: Self-pay | Admitting: Cardiology

## 2011-08-05 DIAGNOSIS — I422 Other hypertrophic cardiomyopathy: Secondary | ICD-10-CM

## 2011-08-05 DIAGNOSIS — R079 Chest pain, unspecified: Secondary | ICD-10-CM

## 2011-08-05 DIAGNOSIS — E7800 Pure hypercholesterolemia, unspecified: Secondary | ICD-10-CM

## 2011-08-05 DIAGNOSIS — I4891 Unspecified atrial fibrillation: Secondary | ICD-10-CM

## 2011-08-05 DIAGNOSIS — F329 Major depressive disorder, single episode, unspecified: Secondary | ICD-10-CM

## 2011-08-05 NOTE — Telephone Encounter (Signed)
Pt said she has been short of breath today please call no pain today

## 2011-08-05 NOTE — Assessment & Plan Note (Signed)
The patient has severe situational depression which was triggered by her husbands death more than a year ago.  The patient is under psychiatric care.  She has not felt As well since starting on Abilify and she is taking herself off the Abilify.  I concur with this since the Abilify is worrisome in patient's with ischemic heart disease

## 2011-08-05 NOTE — Assessment & Plan Note (Addendum)
More dyspneic.  Edema by the end of the day.  Abdomen swells during the course of the day. On daily lasix. Last echo 07/10/11

## 2011-08-05 NOTE — Assessment & Plan Note (Addendum)
Having chest pain at rest.  Last  Nuclear study 07/05/10 normal.  No NTG because of IHSS.  The chest pressure radiates to neck and down both arms.  Will schedule Lexiscan Myoview.

## 2011-08-05 NOTE — Patient Instructions (Signed)
We will schedule lexiscan myoview.

## 2011-08-05 NOTE — Progress Notes (Signed)
[name] [name] Date of Birth:  [birth year] [facility] / [facility] [address]   [address] [place], [place]  [address] [phone]           Fax   [phone]  History of Present Illness: This pleasant 75 year old woman is seen as a work in office visit.  She has been experiencing more chest pain recently.  She has a complex past medical history.  She has been chronically short of breath.  She has a history of hypertrophic obstructive cardiomyopathy.  She also has a pacemaker for tachybradycardia syndrome.  She has been told that her pacemaker is reaching elective replacement interval.  She has an appointment to see Dr. Graciela Husbands in October.  She has underlying atrial fibrillation.  She has had several severe falls and is no longer on Coumadin because of that.  She does remain on home oxygen 2 L a minute.  She has significant dyspnea.  Current Outpatient Prescriptions  Medication Sig Dispense Refill  . ALPRAZolam (XANAX) 0.25 MG tablet Take 1 tablet (0.25 mg total) by mouth 3 (three) times daily as needed.  90 tablet  0  . ARIPiprazole (ABILIFY) 2 MG tablet Take 2 mg by mouth daily. Taking 1/2 daily       . Ascorbic Acid (VITAMIN C) 500 MG tablet Take 500 mg by mouth daily.        [name] aspirin 81 MG tablet Take 81 mg by mouth daily.        . bisoprolol (ZEBETA) 10 MG tablet Take 1 tablet (10 mg total) by mouth daily.  30 tablet  11  . calcium carbonate (OS-CAL) 600 MG TABS Take 600 mg by mouth daily.        . calcium carbonate (TUMS - DOSED IN MG ELEMENTAL CALCIUM) 500 MG chewable tablet Chew 1 tablet by mouth daily.        . Cholecalciferol (VITAMIN D) 2000 UNITS CAPS Take 1 capsule by mouth daily.        . clonazePAM (KLONOPIN) 0.5 MG tablet Take 0.5 mg by mouth 2 (two) times daily as needed.        . [facility] SALINE NASAL SPRAY NA by Nasal route. UAD       . Cyanocobalamin (VITAMIN B-12) 2500 MCG SUBL Place 1 tablet under the tongue daily.        [name] diltiazem (CARDIZEM) 120 MG tablet Take  1 tablet (120 mg total) by mouth daily.  30 tablet  11  . famotidine (PEPCID) 20 MG tablet Take 20 mg by mouth 2 (two) times daily.        [name] FLUoxetine HCl (PROZAC PO) Take by mouth.        . furosemide (LASIX) 40 MG tablet Take one and a half tablets daily for the next 4 days, then back to just once a day.      . gabapentin (NEURONTIN) 300 MG capsule Take 300 mg by mouth. 1 in the am, 2 at lunch, 2 dinner and 2 at bedtime       . HYDROcodone-acetaminophen (NORCO) 10-325 MG per tablet Take 1 tablet by mouth every 6 (six) hours as needed.        [name] losartan (COZAAR) 100 MG tablet Take 1 tablet (100 mg total) by mouth daily.  30 tablet  11  . magnesium oxide (MAG-OX) 400 MG tablet Take 200 mg by mouth daily.       . metFORMIN (GLUCOPHAGE) 500 MG tablet Take 500 mg  by mouth daily. Dr. Clovis Riley Rx       . potassium chloride SA (K-DUR,KLOR-CON) 20 MEQ tablet Take 1 tablet (20 mEq total) by mouth daily.  90 tablet  3  . simvastatin (ZOCOR) 10 MG tablet Take 10 mg by mouth at bedtime.        . triamcinolone (KENALOG) 0.1 % ointment Apply topically 2 (two) times daily.          Allergies  Allergen Reactions  . Aspirin   . Calan (Verapamil Hcl)   . Crestor (Rosuvastatin Calcium)     Muscle pain  . Lexapro   . Lipitor (Atorvastatin Calcium)     myalgias  . Lopressor (Metoprolol Tartrate)   . Norvasc (Amlodipine Besylate)     fatigue  . Penicillins     REACTION: rash    Patient Active Problem List  Diagnoses  . HYPERLIPIDEMIA  . DEPRESSIVE DISORDER NOT ELSEWHERE CLASSIFIED  . HYPERTENSION  . ALLERGIC RHINITIS  . CHRONIC OBSTRUCTIVE PULMONARY DISEASE, MODERATE  . SHORTNESS OF BREATH  . COUGH  . Atrial fibrillation  . Pacemaker  . Hypertrophic cardiomyopathy  . Osteoarthritis  . Situational mixed anxiety and depressive disorder  . Hypercholesterolemia  . Mitral regurgitation  . Fall  . Diabetes mellitus  . Chest pain at rest    History  Smoking status  . Former Smoker  .  Types: Cigarettes  . Quit date: 11/15/2005  Smokeless tobacco  . Never Used    History  Alcohol Use No    Family History  Problem Relation Age of Onset  . Other Father   . Cancer Mother     Review of Systems: Constitutional: no fever chills diaphoresis or fatigue or change in weight.  Head and neck: no hearing loss, no epistaxis, no photophobia or visual disturbance. Respiratory: No cough, shortness of breath or wheezing. Cardiovascular: No chest pain peripheral edema, palpitations. Gastrointestinal: No abdominal distention, no abdominal pain, no change in bowel habits hematochezia or melena. Genitourinary: No dysuria, no frequency, no urgency, no nocturia. Musculoskeletal:No arthralgias, no back pain, no gait disturbance or myalgias. Neurological: No dizziness, no headaches, no numbness, no seizures, no syncope, no weakness, no tremors. Hematologic: No lymphadenopathy, no easy bruising. Psychiatric: No confusion, no hallucinations, no sleep disturbance.    Physical Exam: Filed Vitals:   08/05/11 1512  BP: 120/70  Pulse: 70  The general appearance reveals a well-developed well-nourished woman in no distress.  She is wearing nasal oxygen at 2 L a minutePupils equal and reactive.   Extraocular Movements are full.  There is no scleral icterus.  The mouth and pharynx are normal.  The neck is supple.  The carotids reveal no bruits.  The jugular venous pressure is normal.  The thyroid is not enlarged.  There is no lymphadenopathy.  The chest is clear to percussion and auscultation. There are no rales or rhonchi. Expansion of the chest is symmetrical.   the heart reveals a grade 2/6 harsh systolic ejection murmur at the base.  No diastolic murmur.  No gallop or rub.The abdomen is soft and nontender. Bowel sounds are normal. The liver and spleen are not enlarged. There Are no abdominal masses. There are no bruits.  The pedal pulses are good.  There is no phlebitis or edema.  There is  no cyanosis or clubbing.  Strength is normal and symmetrical in all extremities.  There is no lateralizing weakness.  There are no sensory deficits.  The skin is warm and dry.  There is no rash.    Her EKG shows atrial fibrillation with a controlled ventricular response and a intermittently paced rhythm.  The native QRS complexes show significant inferolateral T-wave abnormality consistent with LVH with strain consistent with her known hypertrophic obstructive cardiomyopathy.   Assessment / Plan: We will arrange for a LexiScan Myoview.  Continue present medication.

## 2011-08-05 NOTE — Telephone Encounter (Signed)
Has been having chest pains and shortness of breath for several days.  Seeing  Dr. Patty Sermons today at 3:00

## 2011-08-05 NOTE — Assessment & Plan Note (Addendum)
No longer on coumadin because of fall risk.  She has not had any TIA symptoms or strokes.

## 2011-08-08 LAB — POCT I-STAT 3, ART BLOOD GAS (G3+)
Operator id: 280981
pCO2 arterial: 41.9
pO2, Arterial: 85

## 2011-08-08 LAB — BASIC METABOLIC PANEL
BUN: 8
Calcium: 9.1
Calcium: 9.2
Chloride: 95 — ABNORMAL LOW
Creatinine, Ser: 1.91 — ABNORMAL HIGH
GFR calc Af Amer: 32 — ABNORMAL LOW
GFR calc non Af Amer: 26 — ABNORMAL LOW
GFR calc non Af Amer: 59 — ABNORMAL LOW
GFR calc non Af Amer: >60
GFR calc non Af Amer: >60
Glucose, Bld: 133 — ABNORMAL HIGH
Glucose, Bld: 93
Potassium: 3.2 — ABNORMAL LOW
Potassium: 3.3 — ABNORMAL LOW
Sodium: 136
Sodium: 138
Sodium: 138

## 2011-08-08 LAB — PROTIME-INR
INR: 2.2 — ABNORMAL HIGH
INR: 2.3 — ABNORMAL HIGH
INR: 2.5 — ABNORMAL HIGH
INR: 2.5 — ABNORMAL HIGH
Prothrombin Time: 24.8 — ABNORMAL HIGH
Prothrombin Time: 26.4 — ABNORMAL HIGH
Prothrombin Time: 27.6 — ABNORMAL HIGH

## 2011-08-08 LAB — DIFFERENTIAL
Basophils Absolute: 0
Lymphocytes Relative: 23
Neutro Abs: 5.1

## 2011-08-08 LAB — B-NATRIURETIC PEPTIDE (CONVERTED LAB): Pro B Natriuretic peptide (BNP): 445 — ABNORMAL HIGH

## 2011-08-08 LAB — CARDIAC PANEL(CRET KIN+CKTOT+MB+TROPI)
CK, MB: 3.7
CK, MB: 3.8
CK, MB: 3.9
CK, MB: 4.3 — ABNORMAL HIGH
Relative Index: 2.2
Relative Index: 2.6 — ABNORMAL HIGH
Total CK: 163
Troponin I: 0.02

## 2011-08-08 LAB — CBC
Hemoglobin: 15
Platelets: 169
RDW: 14.4

## 2011-08-12 ENCOUNTER — Telehealth: Payer: Self-pay | Admitting: Cardiology

## 2011-08-12 NOTE — Telephone Encounter (Signed)
Patient stated she took another Lasix this morning.  Had her check blood pressure and is 150's/80's.  Advised to go ahead and take extra Zebeta, rest and keep stress test appointment this week

## 2011-08-12 NOTE — Telephone Encounter (Signed)
Agree with advice given

## 2011-08-12 NOTE — Telephone Encounter (Signed)
OK to use extra half of Zebeta. Stress test for later this week.

## 2011-08-12 NOTE — Telephone Encounter (Signed)
Heart rate beating really fast at 71 just walking down the hall.  Explained that wasn't fast but states when gets up like this it feels like it is going to beat out of her chest.  Denies pains, just the pounding feeling.  When rests it goes back to normal, but with any movement it starts again.  Has been doing this all weekend.  Oxygen doesn't feel like it helps much.  Terrible shooting pains in her head as well.  Any suggestions or discuss with  Dr. Patty Sermons?

## 2011-08-12 NOTE — Telephone Encounter (Signed)
Pt having sob off and on last couple days, getting worse, pt on oxygen, doesn't seem to be helping

## 2011-08-15 ENCOUNTER — Ambulatory Visit (HOSPITAL_COMMUNITY): Payer: Medicare Other | Attending: Cardiology | Admitting: Radiology

## 2011-08-15 DIAGNOSIS — R0789 Other chest pain: Secondary | ICD-10-CM

## 2011-08-15 DIAGNOSIS — R9431 Abnormal electrocardiogram [ECG] [EKG]: Secondary | ICD-10-CM

## 2011-08-15 DIAGNOSIS — I4949 Other premature depolarization: Secondary | ICD-10-CM

## 2011-08-15 DIAGNOSIS — R079 Chest pain, unspecified: Secondary | ICD-10-CM | POA: Insufficient documentation

## 2011-08-15 DIAGNOSIS — I4891 Unspecified atrial fibrillation: Secondary | ICD-10-CM

## 2011-08-15 DIAGNOSIS — R0609 Other forms of dyspnea: Secondary | ICD-10-CM

## 2011-08-15 MED ORDER — REGADENOSON 0.4 MG/5ML IV SOLN
0.4000 mg | Freq: Once | INTRAVENOUS | Status: AC
  Administered 2011-08-15: 0.4 mg via INTRAVENOUS

## 2011-08-15 MED ORDER — TECHNETIUM TC 99M TETROFOSMIN IV KIT
11.0000 | PACK | Freq: Once | INTRAVENOUS | Status: AC | PRN
  Administered 2011-08-15: 11 via INTRAVENOUS

## 2011-08-15 MED ORDER — AMINOPHYLLINE 25 MG/ML IV SOLN
75.0000 mg | Freq: Once | INTRAVENOUS | Status: AC
  Administered 2011-08-15: 75 mg via INTRAVENOUS

## 2011-08-15 MED ORDER — TECHNETIUM TC 99M TETROFOSMIN IV KIT
33.0000 | PACK | Freq: Once | INTRAVENOUS | Status: AC | PRN
  Administered 2011-08-15: 33 via INTRAVENOUS

## 2011-08-15 NOTE — Progress Notes (Signed)
[facility] SITE 3 NUCLEAR MED [address] [place] [address] [phone]  Cardiology Nuclear Med [name] [name] is a 75 y.o. female [id number] 03/31/1936   Nuclear Med Background Indication for Stress Test:  Evaluation for Ischemia History:  '98 Pacemaker (SSS) with generator change in 2010; '06 Cardioversion,10/10 Right Heart Cath:mild to moderate pulmonary hypretension,8/11 MPS: (-) ischemia, EF=52% [facility]), 8/12 Echo: Moderate LVH,Pulmonary Hypertension, AFIB,CHF,HCM,COPD/Asthma Cardiac Risk Factors: Chest Pain and Chest  Pressure with and without  Exertion (last date of chest discomfort 4 days ago), Dizziness, DOE, Fatigue, Fatigue with Exertion, Light-Headedness, Nausea, Near Syncope, SOB and Syncope   Nuclear Pre-Procedure Caffeine/Decaff Intake:  None NPO After: 9:30pm   Lungs:  clear IV 0.9% NS with Angio Cath:  22g  IV Site: R Forearm  IV Started by:  Stanton Kidney, EMT-P  Chest Size (in):  36 Cup Size: B  Height: 5\' 4"  (1.626 m)  Weight:  150 lb (68.04 kg)  BMI:  Body mass index is 25.75 kg/(m^2). Tech Comments:  Bisoprolol held > 13 hours, per patient.  CBG= 169 @ 8:45 am today, per patient.    Nuclear Med Study 1 or 2 day study: 1 day  Stress Test Type:  Eugenie Birks  Reading MD: Olga Millers, MD  Order Authorizing Provider:  Cassell Clement, MD  Resting Radionuclide: Technetium 70m Tetrofosmin  Resting Radionuclide Dose: 11.0 mCi   Stress Radionuclide:  Technetium 54m Tetrofosmin  Stress Radionuclide Dose: 33.0 mCi           Stress Protocol Rest HR: 60 Stress HR: 72  Rest BP: 167/80 Stress BP: 147/73  Exercise Time (min): n/a METS: n/a   Predicted Max HR: 147 bpm % Max HR: 48.98 bpm Rate Pressure Product: 21308   Dose of Adenosine (mg):  n/a Dose of Lexiscan: 0.4 mg  Dose of Atropine (mg): n/a Dose of Dobutamine: n/a mcg/kg/min (at max HR)  Stress Test Technologist: Irean Hong, RN  Nuclear Technologist:  Domenic Polite, CNMT      Rest Procedure:  Myocardial perfusion imaging was performed at rest 45 minutes following the intravenous administration of Technetium 16m Tetrofosmin. Rest ECG: Intermittent V Pacing with underlying rhythm AFIB, LVH with strain, PVC's  Stress Procedure:  The patient received IV Lexiscan 0.4 mg over 15-seconds.  Technetium 42m Tetrofosmin injected at 30-seconds.  The EKG was non-diagnostic due to baseline changes.There were frequent PVC's. The patient remained symptomatic 10 minutes in recovery with Chest pain 7/10,fatigue, and weakness  In legs. Aminophylline 75 mg IVP given with slow resolution of symptoms. Quantitative spect images were obtained after a 45 minute delay. Stress ECG: Uninteretable due to baseline ventricular pacing and baseline changes  QPS Raw Data Images:  Acquisition technically good; normal left ventricular size. Stress Images:  There is decreased uptake in the anteroseptal wall. Rest Images:  There is decreased uptake in the anteroseptal wall. Subtraction (SDS):  No evidence of ischemia. Transient Ischemic Dilatation (Normal <1.22):  1.14 Lung/Heart Ratio (Normal <0.45):  0.32  Quantitative Gated Spect Images QGS EDV:  76 ml QGS ESV:  29 ml QGS cine images:  NL LV Function; NL Wall Motion QGS EF: 62%  Impression Exercise Capacity:  Lexiscan with no exercise. BP Response:  Normal blood pressure response. Clinical Symptoms:  There is chest pain. ECG Impression: EKG uninterpretable due to ventricular pacing and baseline changes. Comparison with Prior Nuclear Study: No images to compare  Overall Impression:  Normal stress nuclear study with with a  small fixed anteroseptal defect c/w soft tissue attenuation but no  ischemia.   Olga Millers

## 2011-08-19 ENCOUNTER — Telehealth: Payer: Self-pay | Admitting: Cardiology

## 2011-08-19 LAB — CBC
HCT: 37.7
Hemoglobin: 12.6
MCHC: 33.4
MCV: 103.6 — ABNORMAL HIGH
Platelets: 166
RBC: 3.64 — ABNORMAL LOW
RBC: 3.76 — ABNORMAL LOW
WBC: 4.7
WBC: 5.5

## 2011-08-19 LAB — URINALYSIS, ROUTINE W REFLEX MICROSCOPIC
Bilirubin Urine: NEGATIVE
Ketones, ur: NEGATIVE
Protein, ur: NEGATIVE
Urobilinogen, UA: 1

## 2011-08-19 LAB — BASIC METABOLIC PANEL
BUN: 9
CO2: 28
CO2: 29
Calcium: 9.3
Calcium: 9.4
Chloride: 102
Chloride: 99
Creatinine, Ser: 0.85
GFR calc Af Amer: >60
GFR calc Af Amer: >60
Glucose, Bld: 113 — ABNORMAL HIGH
Glucose, Bld: 114 — ABNORMAL HIGH
Potassium: 3.4 — ABNORMAL LOW
Sodium: 136

## 2011-08-19 LAB — I-STAT 8, (EC8 V) (CONVERTED LAB)
Acid-Base Excess: 3 — ABNORMAL HIGH
Bicarbonate: 29.7 — ABNORMAL HIGH
Glucose, Bld: 99
Potassium: 3.6
Sodium: 136
TCO2: 31
pH, Ven: 7.361 — ABNORMAL HIGH

## 2011-08-19 LAB — URINE CULTURE: Colony Count: 40000

## 2011-08-19 LAB — DIFFERENTIAL
Basophils Relative: 1
Lymphs Abs: 1.2
Monocytes Relative: 8
Neutro Abs: 3
Neutrophils Relative %: 63

## 2011-08-19 LAB — CARDIAC PANEL(CRET KIN+CKTOT+MB+TROPI)
CK, MB: 3.3
Relative Index: 1.9
Total CK: 176

## 2011-08-19 LAB — SAMPLE TO BLOOD BANK

## 2011-08-19 LAB — PROTIME-INR
INR: 2 — ABNORMAL HIGH
Prothrombin Time: 21.7 — ABNORMAL HIGH

## 2011-08-19 LAB — POCT CARDIAC MARKERS
CKMB, poc: 3.7
Troponin i, poc: <0.05

## 2011-08-19 LAB — POCT I-STAT CREATININE
Creatinine, Ser: 1
Operator id: 270111

## 2011-08-19 NOTE — Telephone Encounter (Signed)
Pt requesting Stress test results

## 2011-08-19 NOTE — Telephone Encounter (Signed)
Pt aware of stress test results Debbie Willowdean Luhmann RN  

## 2011-08-21 ENCOUNTER — Telehealth: Payer: Self-pay | Admitting: *Deleted

## 2011-08-21 NOTE — Telephone Encounter (Signed)
Advised of results

## 2011-08-21 NOTE — Telephone Encounter (Signed)
Message copied by Burnell Blanks on Wed Aug 21, 2011  1:25 PM ------      Message from: Cassell Clement      Created: Fri Aug 16, 2011  5:38 PM       Please report.  The stress test did not show any ischemia.  Her ejection fraction was normal.  Continue on present medication.

## 2011-08-21 NOTE — Progress Notes (Signed)
Advised of results

## 2011-08-28 IMAGING — CR DG RIBS W/ CHEST 3+V*L*
3 series · 3 of 3 positions shown · non-contrast
Comparison: Chest x-ray of 04/27/2008

CLINICAL DATA: Fell with left lateral rib pain

LEFT RIBS AND CHEST - 3+ VIEW

[w chest pa]
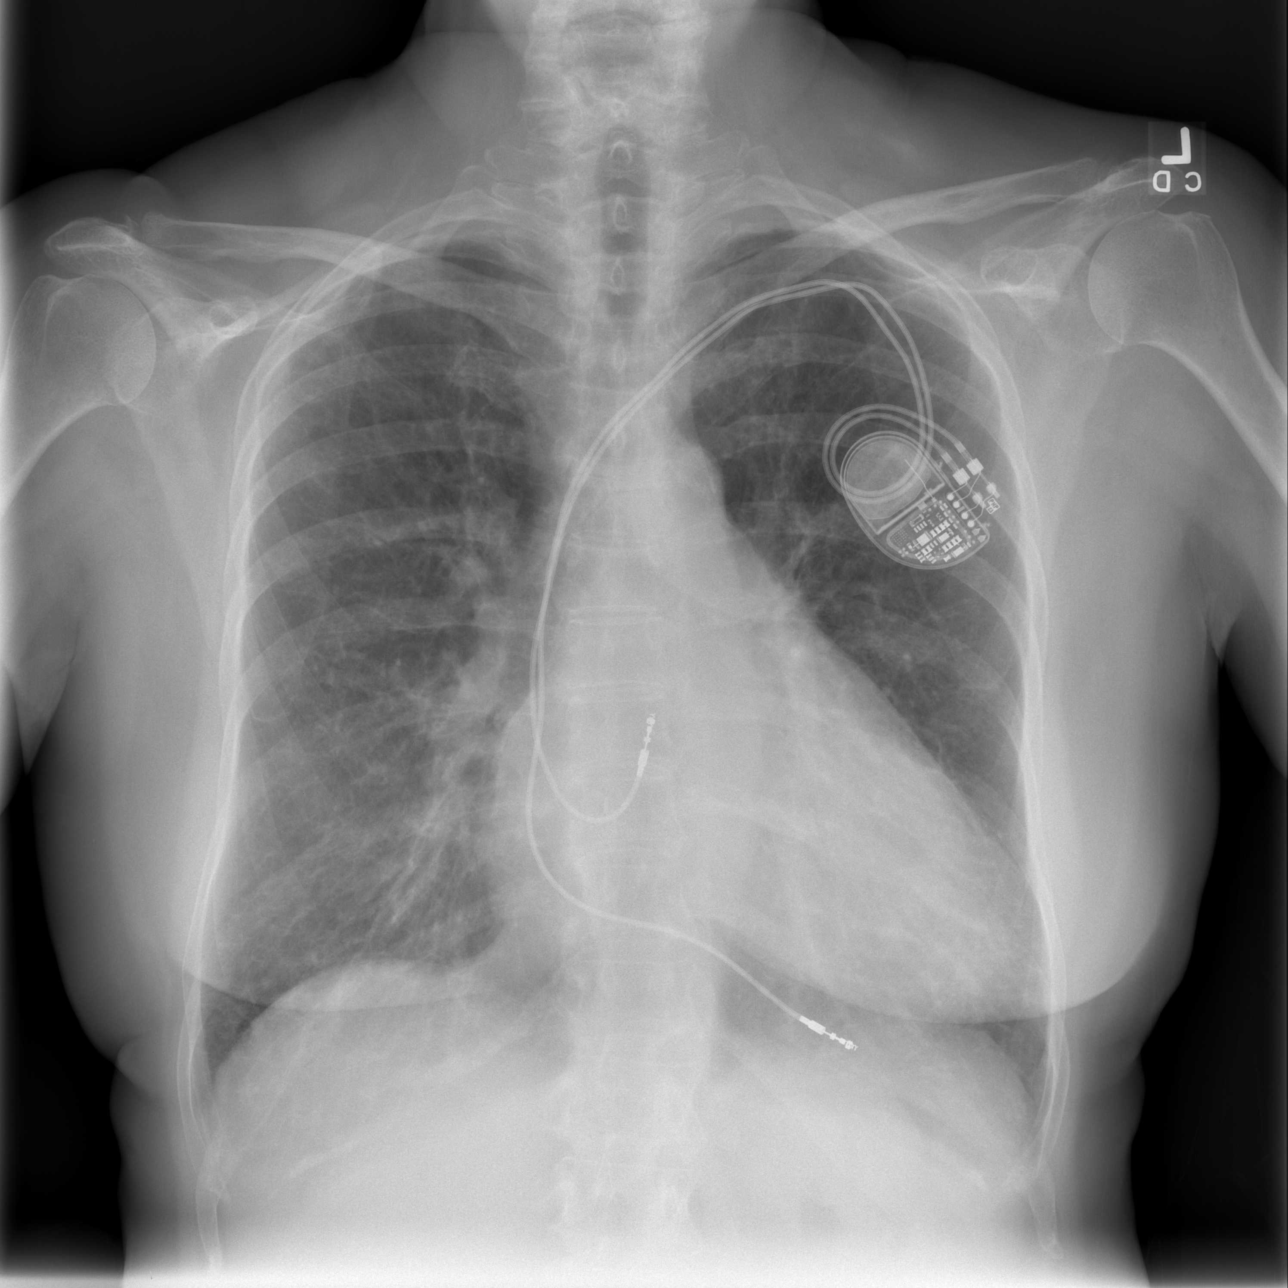

[w ribs ap/pa lower left *]
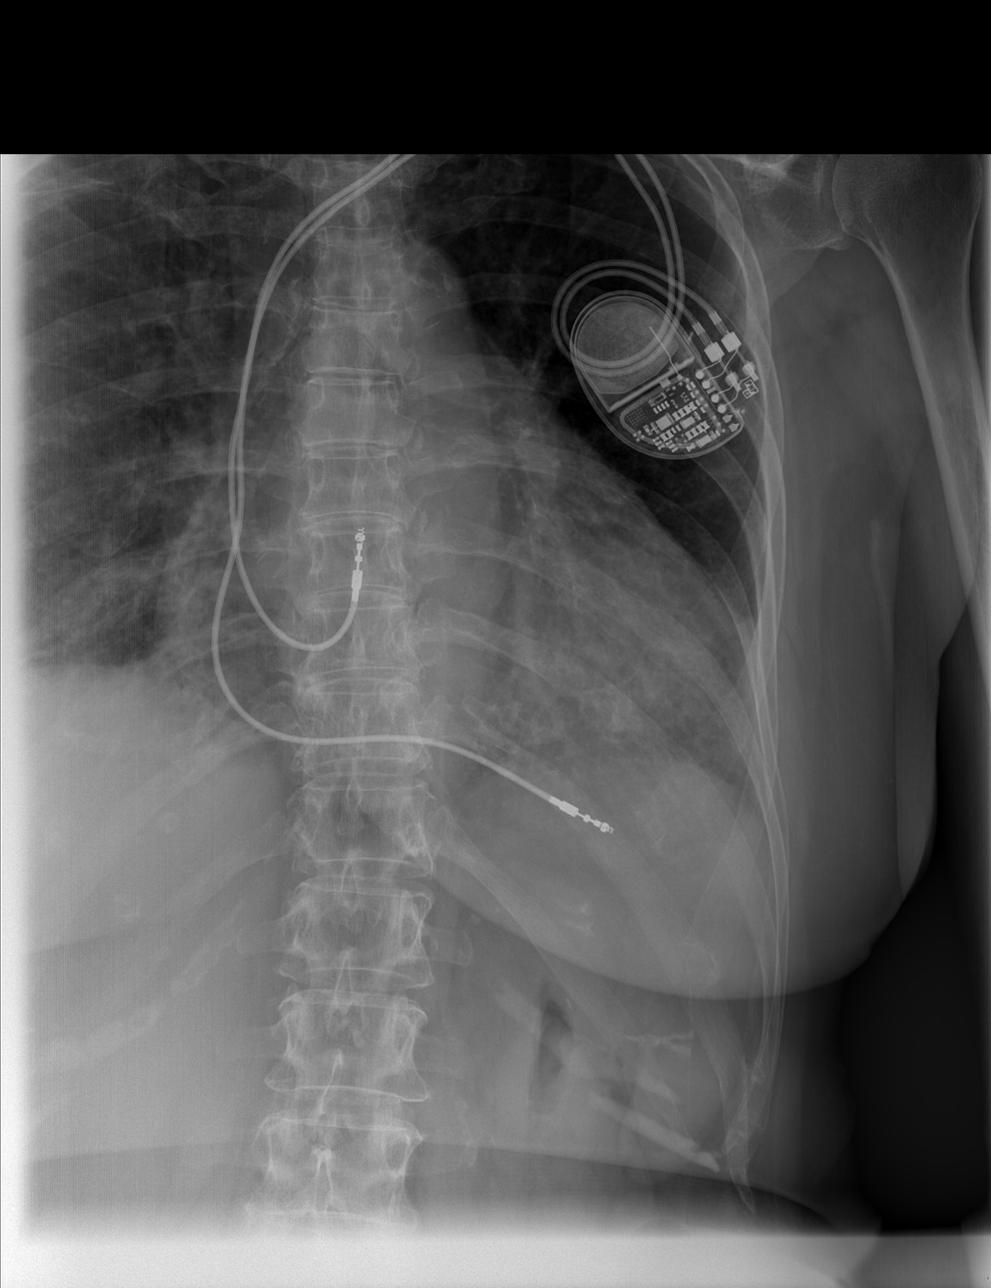

[w ribs oblique left *]
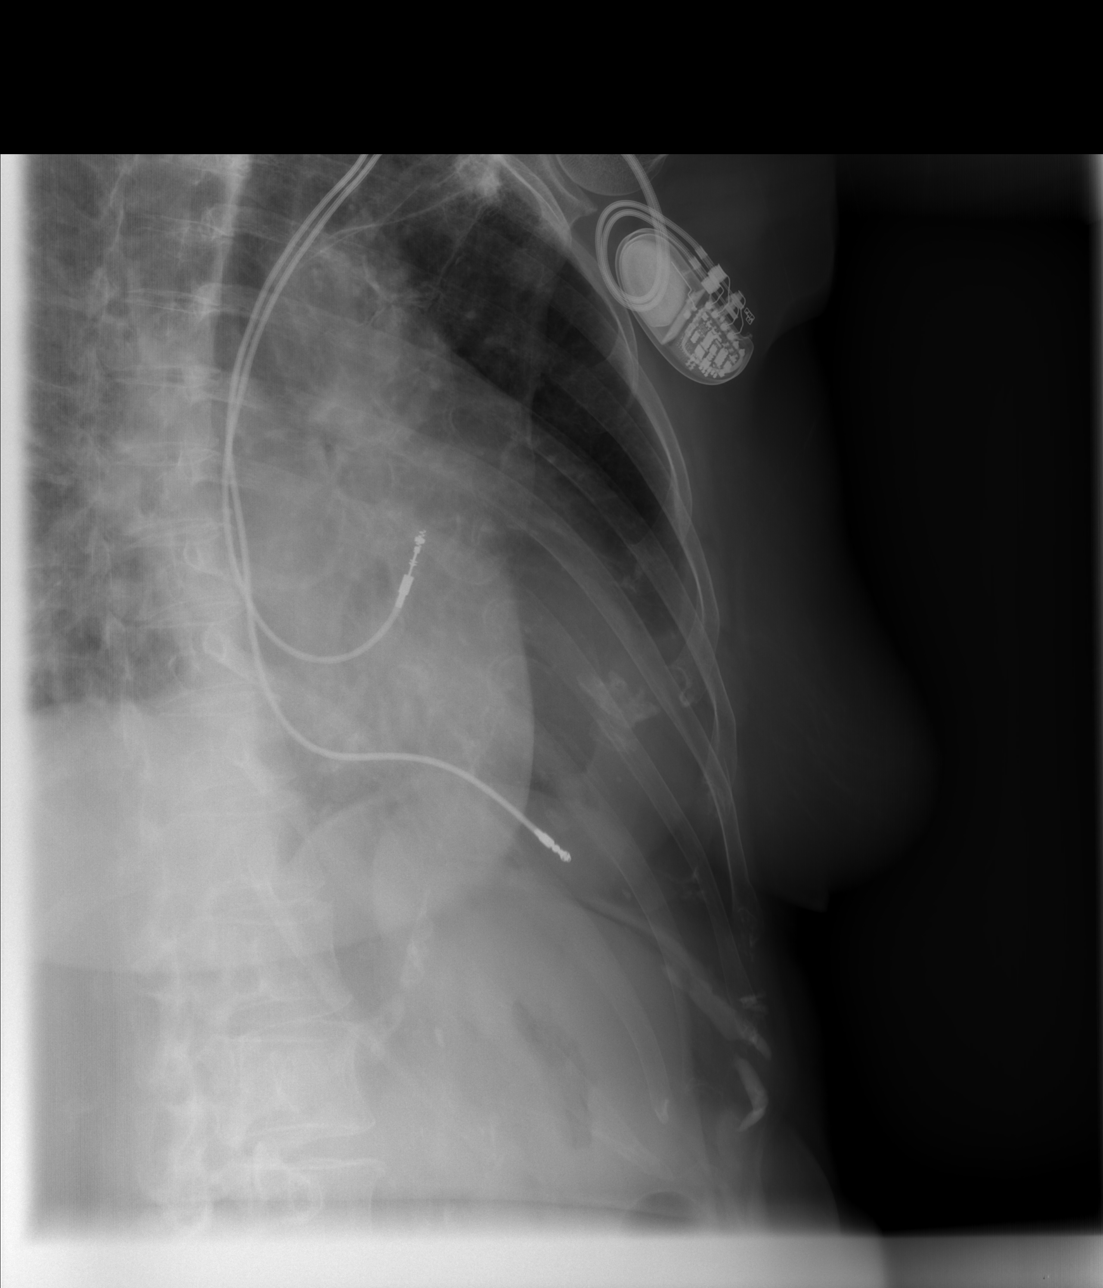

[3 of 3 positions shown; findings below may reference images not displayed]

FINDINGS: The lungs are clear and slightly hyperaerated.
Cardiomegaly is present and there may be mild pulmonary vascular
congestion present.  A permanent pacemaker remains.  No
pneumothorax is seen.  Left rib detail films show no evidence of
acute left rib fracture.
IMPRESSION: 1.  Cardiomegaly.  Question of mild pulmonary vascular congestion.
2.  No acute left rib fracture is seen.
3.  Hyperaeration

## 2011-08-28 IMAGING — CT CT MAXILLOFACIAL W/O CM
3 of 5 series · 17 of 40 positions shown, 19 images · non-contrast
Comparison: CT scan of the head dated 01/24/2009

CT HEAD

CLINICAL DATA: Facial pain and swelling and neck pain and
headaches after the patient fell and struck her face today.

CT HEAD WITHOUT CONTRAST
CT MAXILLOFACIAL WITHOUT CONTRAST
CT CERVICAL SPINE WITHOUT CONTRAST
TECHNIQUE: Multidetector CT imaging of the head, cervical spine,
and maxillofacial structures were performed using the standard
protocol without intravenous contrast. Multiplanar CT image
reconstructions of the cervical spine and maxillofacial structures
were also generated.

[Series 10: c_spine 2.0 b31s · axial · 0.23mm/px · z∈[-286,-146]mm · 7 of 94 slices shown]
[im 12/94  bone]
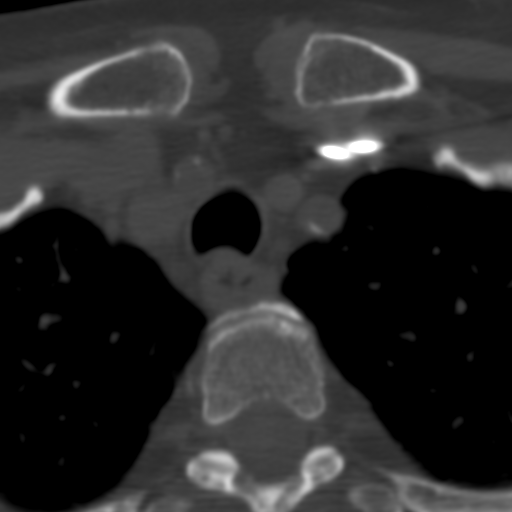
[im 24/94  bone]
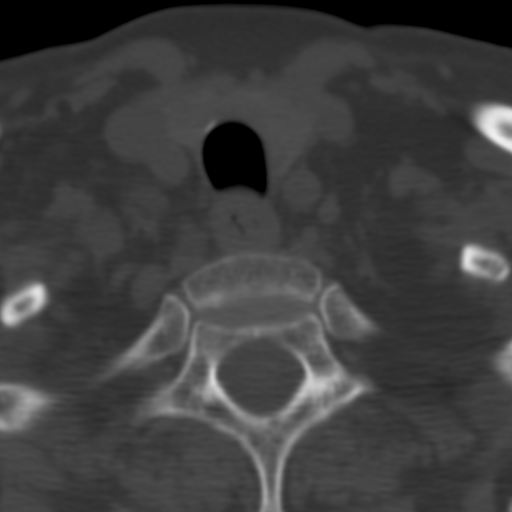
[im 35/94  bone]
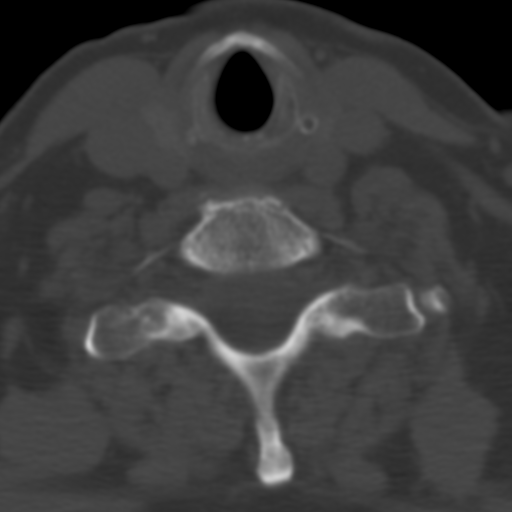
[im 47/94  bone]
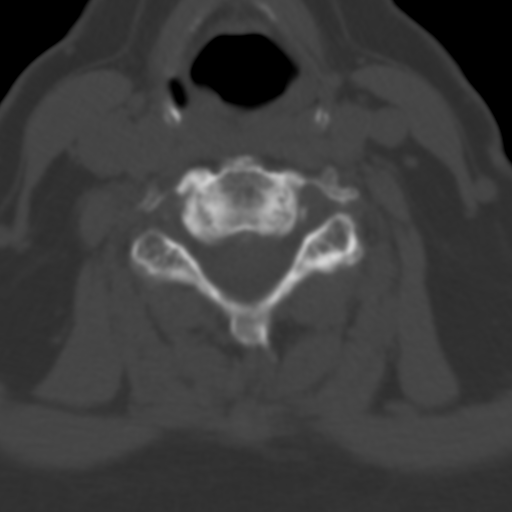
[im 59/94  bone]
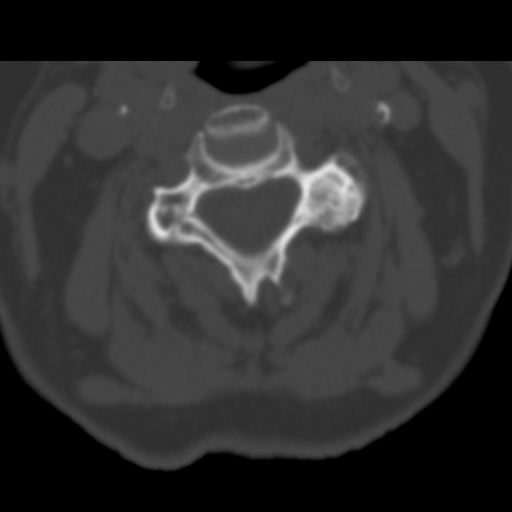
[im 70/94  bone]
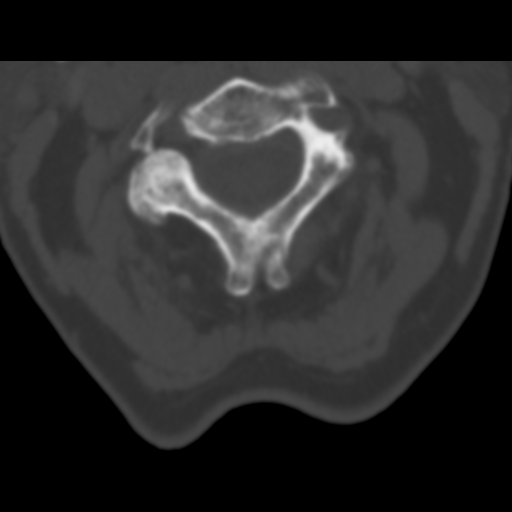
[im 82/94  bone]
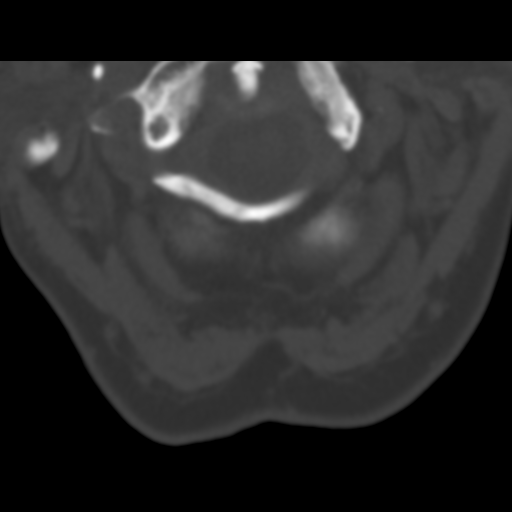

[Series 604: <mpr thick range(2)> · coronal · 0.37mm/px · 3 of 47 slices shown]
[im 16/47  bone]
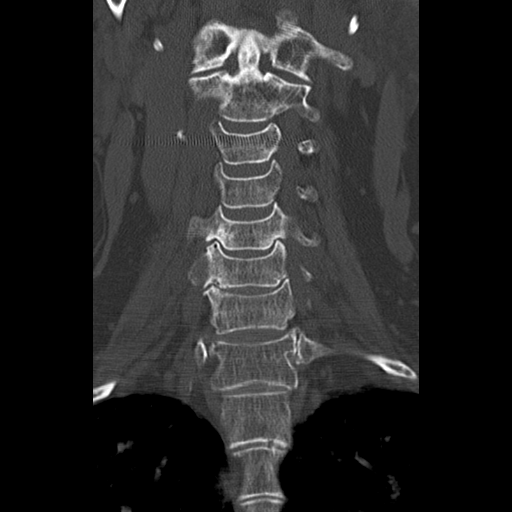
[im 21/47  bone]
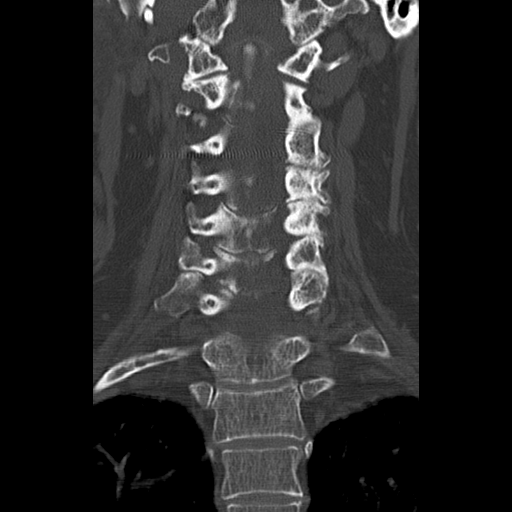
[im 26/47  bone]
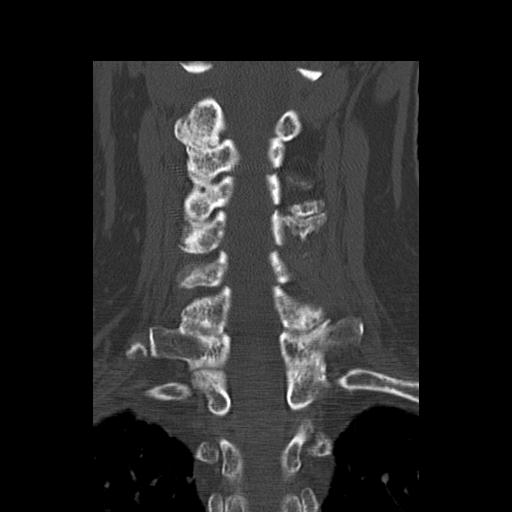

[Series 605: <mpr thick range(3)> · axial · 0.37mm/px · z∈[-320,-189]mm · 7 of 94 slices shown, 9 images]
[im 12/94  brain]
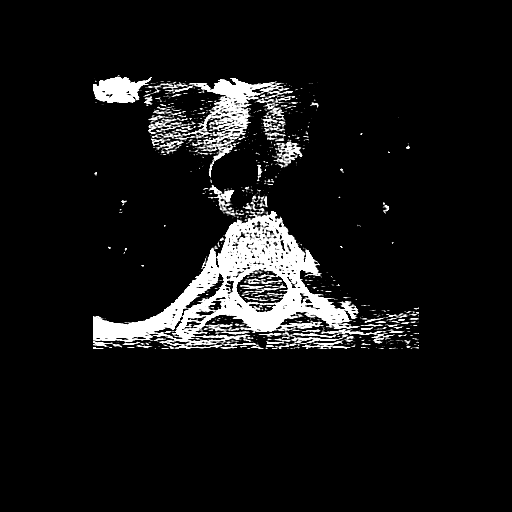
[im 12/94  bone]
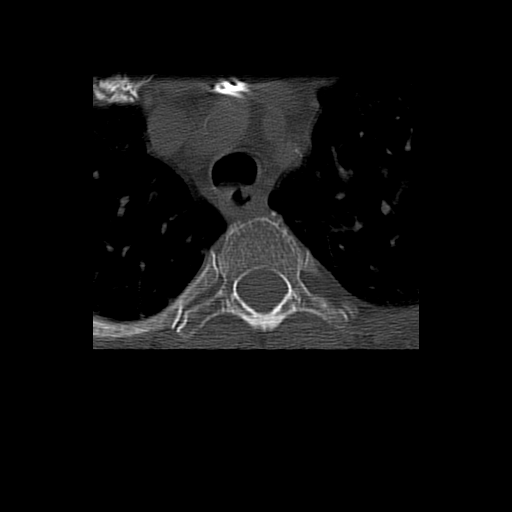
[im 24/94  bone]
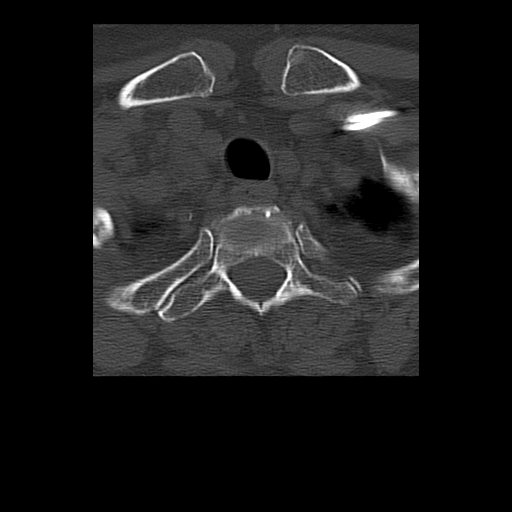
[im 35/94  bone]
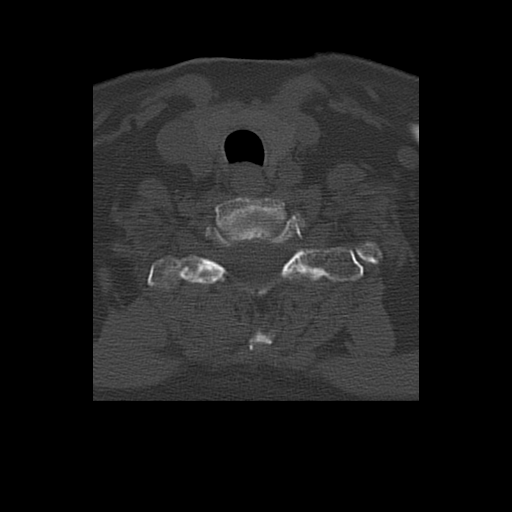
[im 47/94  bone]
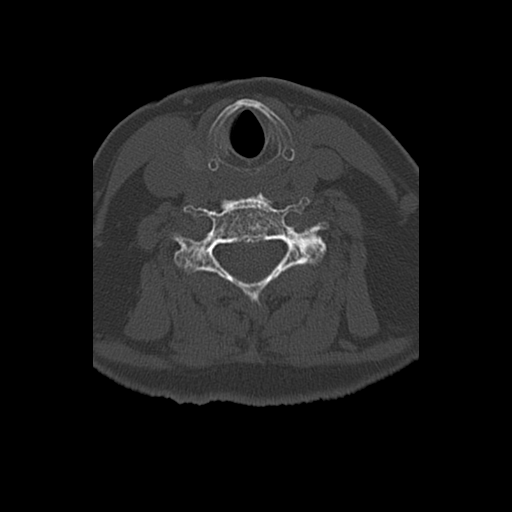
[im 59/94  brain]
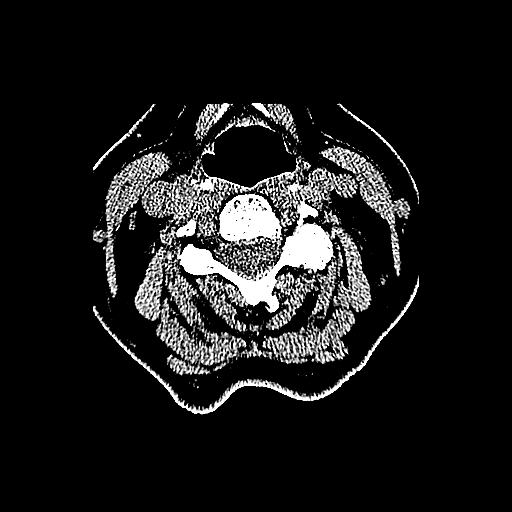
[im 59/94  bone]
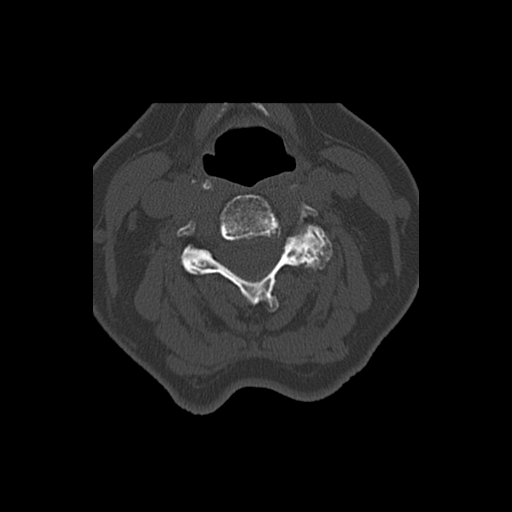
[im 70/94  bone]
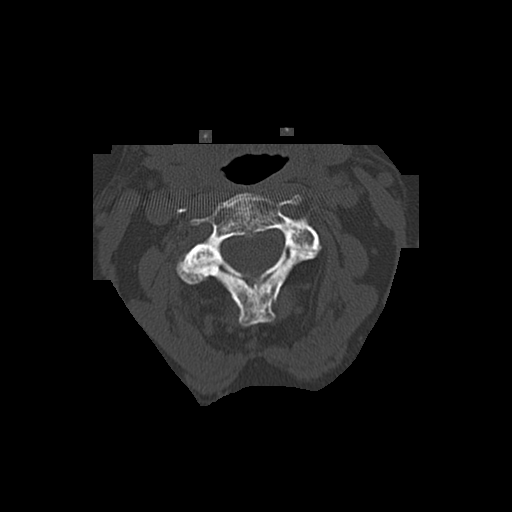
[im 82/94  bone]
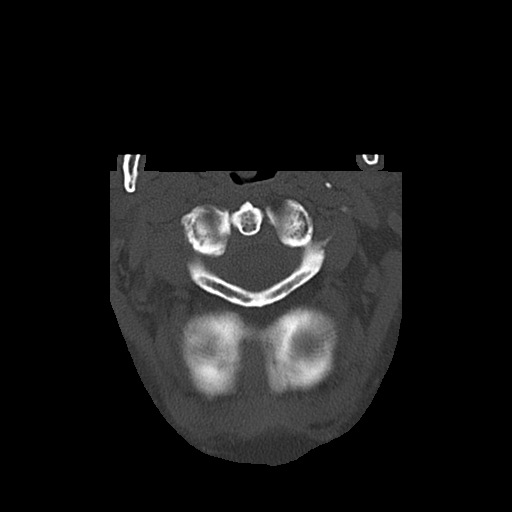

[17 of 40 positions shown; findings below may reference images not displayed]

FINDINGS: There is no acute intracranial hemorrhage, acute
infarction, or intracranial mass lesion.  Brain parenchyma is
normal.  Bony structures are normal.
IMPRESSION: Normal CT scan head without contrast.

CT MAXILLOFACIAL
FINDINGS: The bony structures are normal.  Paranasal sinuses are
clear except for a very tiny amount of fluid in the left maxillary
sinus.  There is no significant soft tissue contusion.
IMPRESSION: Essentially normal exam.

CT CERVICAL SPINE
FINDINGS: The scan extends from the lower clivus through the T3-4
level.  There is no fracture, subluxation, or prevertebral soft
tissue swelling.  There is moderate degenerative disc disease at C5-
6 and C6-7 with fairly severe facet joint arthritis at C3-4, C4-5
and C5-6 on the left and at C2-3 on the right.
IMPRESSION: No acute abnormalities.  Degenerative disc and joint disease as
described.

## 2011-08-28 IMAGING — CR DG HIP W/ PELVIS BILAT
5 series · 5 of 5 positions shown · non-contrast
Comparison: None.

CLINICAL DATA: The patient fell.  Low back pain.

BILATERAL HIP WITH PELVIS - 4+ VIEW

[t pelvis a.p.]
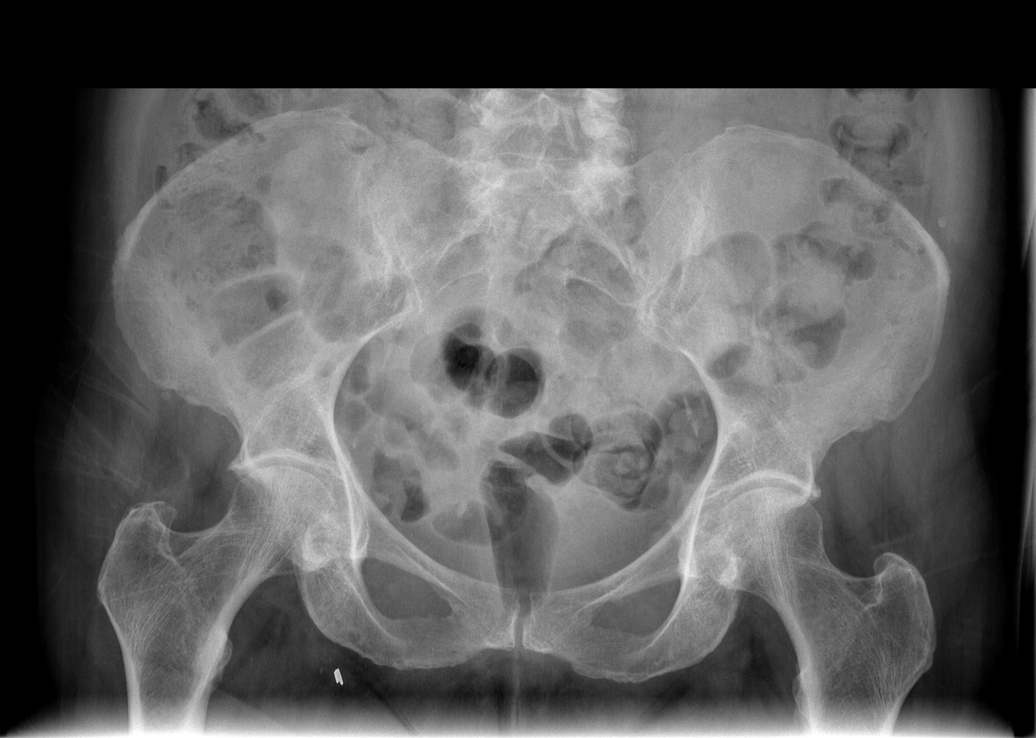

[t hip ap left]
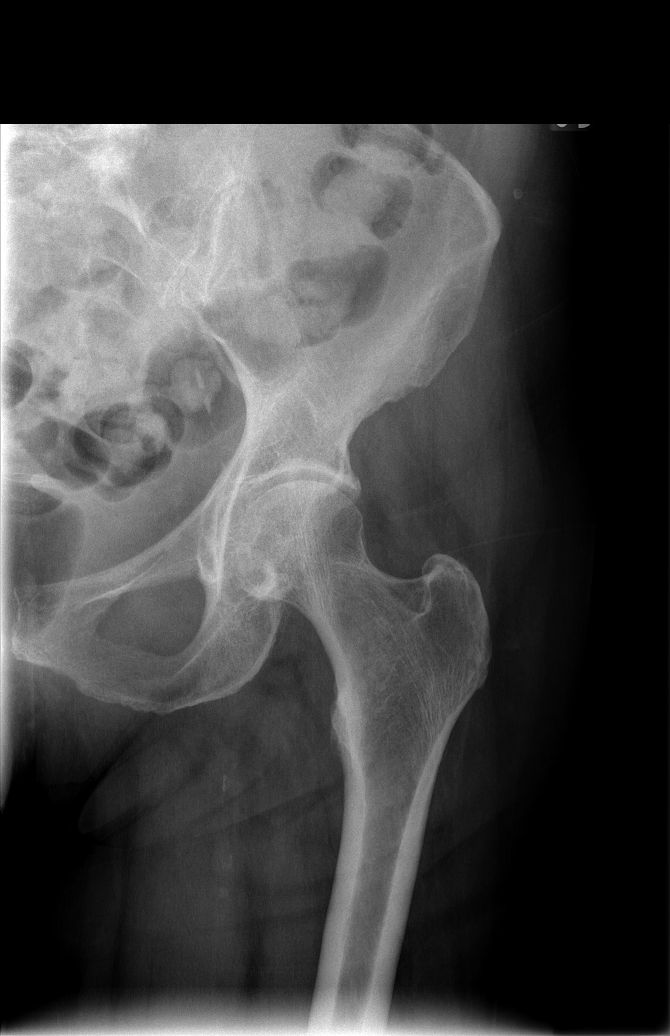

[t hip ap right]
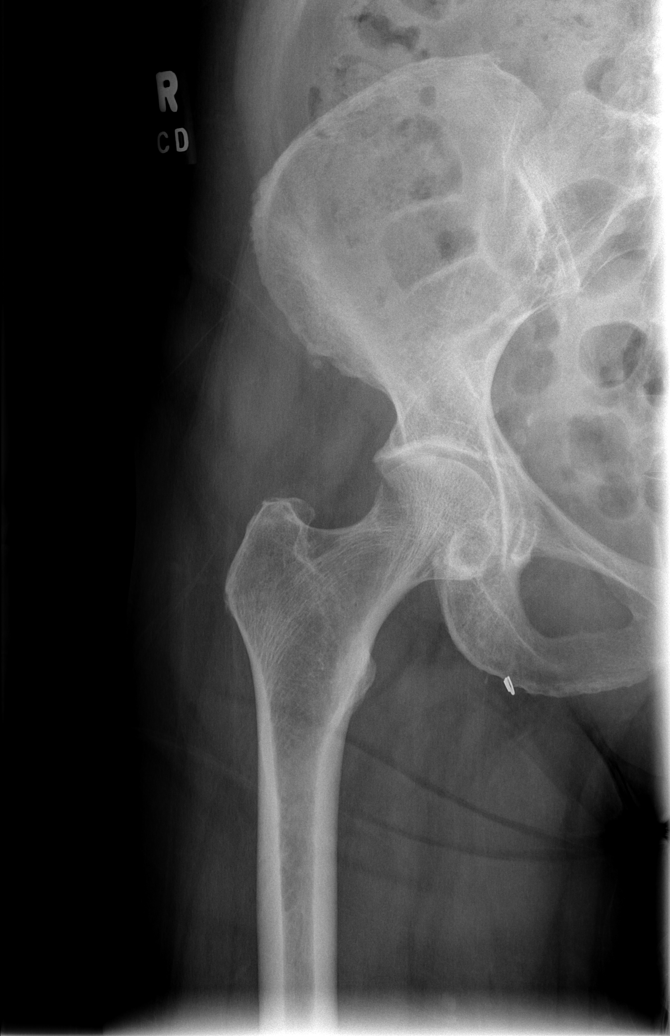

[t hip frog leg left]
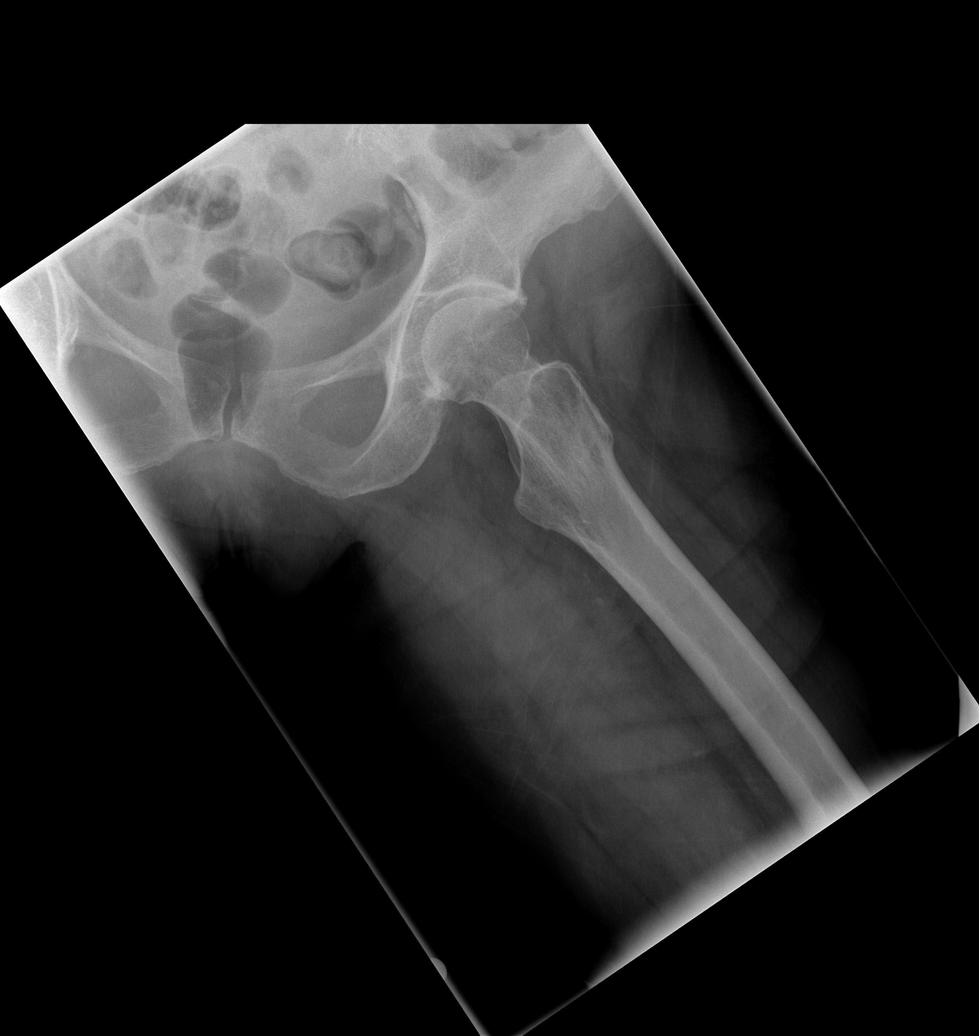

[t hip frog leg right]
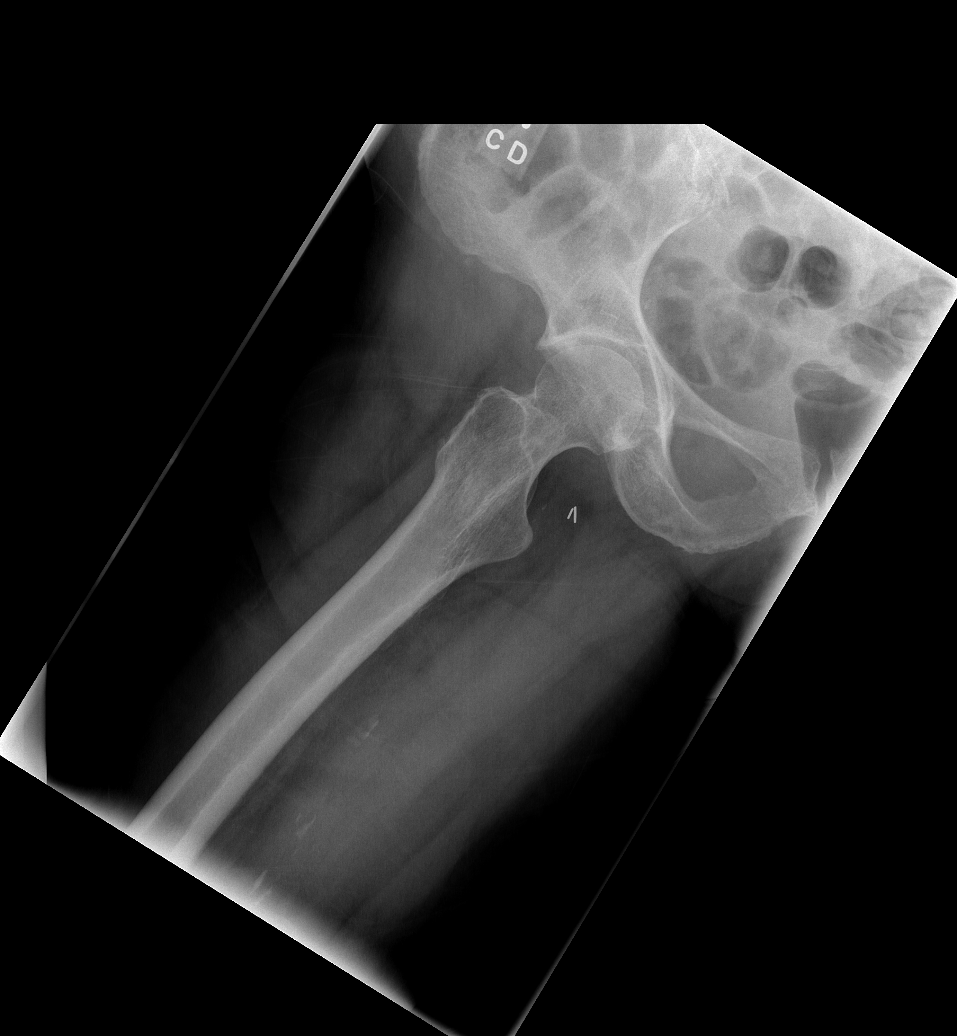

[5 of 5 positions shown; findings below may reference images not displayed]

FINDINGS: There is no evidence of hip fracture or dislocation.
There is no evidence of arthropathy or other focal bone
abnormality.
IMPRESSION: Negative.

## 2011-09-02 ENCOUNTER — Telehealth: Payer: Self-pay | Admitting: Cardiology

## 2011-09-02 NOTE — Telephone Encounter (Signed)
Patient just had dental surgery last week and has been eating a lot of soup, can only eat soft foods.  Has not been getting low sodium soup.  Will have to be eating soft foods for a while still.  Did advise to get low sodium soup.  Only takes lasix 49 mg daily.  Please advise

## 2011-09-02 NOTE — Telephone Encounter (Signed)
Per pt call both ankles are swelling and have been for a week now she has been taking her fluid pill but they are still swollen and don't seem to be going down.

## 2011-09-02 NOTE — Telephone Encounter (Signed)
Advised 

## 2011-09-02 NOTE — Telephone Encounter (Signed)
Would try to avoid the soup. May take extra 20 mg with her 40 mg of Lasix for the next 2 days only, then cut back. Needs to elevate her legs.

## 2011-09-05 ENCOUNTER — Encounter: Payer: Medicare Other | Admitting: *Deleted

## 2011-09-07 ENCOUNTER — Telehealth: Payer: Self-pay | Admitting: Physician Assistant

## 2011-09-07 NOTE — Telephone Encounter (Signed)
Patient complaining of LE edema since 08/26/11. Takes Lasix 40 mg QD. Took extra 1/2 tablet for 2 days. Helped some. She is on chronic O2. Notes chronic DOE.  Not getting any worse. No chest pain.  Recent stress test normal. Sleeps on 2 pillows.  No PND.   Weights going up.  Weight today 159.  Usually 154-155. Increase Lasix to 60 mg QD and K+ to 30 mEq QD. Will ask RN to call on Monday and bring in for BMET this week (Wednesday or Thursday) She will call back if no better or worse. Tereso Newcomer, PA-C

## 2011-09-09 NOTE — Telephone Encounter (Signed)
[name] [name],called her. States swelling is some better. Was in hurry to see PCP. To call back. Told her she needs some lab work this week per [employer] note.

## 2011-09-09 NOTE — Telephone Encounter (Signed)
Called pt per [name] [name]. She states swelling is down some. States she did increased Lasix but wasn't sure of KCL. Was in hurry because she had an app w/Dr. Clovis Riley at 11:30. Advised to call us back. She will need to be scheduled for Bmet  Wed or Thurs this week. Will make [facility] aware.

## 2011-09-12 ENCOUNTER — Encounter: Payer: Self-pay | Admitting: *Deleted

## 2011-09-12 ENCOUNTER — Other Ambulatory Visit: Payer: Medicare Other | Admitting: *Deleted

## 2011-09-13 ENCOUNTER — Other Ambulatory Visit (INDEPENDENT_AMBULATORY_CARE_PROVIDER_SITE_OTHER): Payer: Medicare Other | Admitting: *Deleted

## 2011-09-13 ENCOUNTER — Other Ambulatory Visit: Payer: Medicare Other | Admitting: *Deleted

## 2011-09-13 DIAGNOSIS — E876 Hypokalemia: Secondary | ICD-10-CM

## 2011-09-13 LAB — BASIC METABOLIC PANEL
CO2: 29 mEq/L (ref 19–32)
Calcium: 9.6 mg/dL (ref 8.4–10.5)
Potassium: 3.7 mEq/L (ref 3.5–5.1)
Sodium: 139 mEq/L (ref 135–145)

## 2011-09-17 ENCOUNTER — Telehealth: Payer: Self-pay | Admitting: *Deleted

## 2011-09-17 NOTE — Telephone Encounter (Signed)
Advised of labs.  Will send copy to Dr Clovis Riley

## 2011-09-17 NOTE — Telephone Encounter (Signed)
Message copied by Burnell Blanks on Tue Sep 17, 2011 11:20 AM ------      Message from: Cassell Clement      Created: Sun Sep 15, 2011  5:56 PM       The kidney function and the K remain good. CSD.      The BS remains high--f/u with PCP.

## 2011-09-27 ENCOUNTER — Ambulatory Visit (INDEPENDENT_AMBULATORY_CARE_PROVIDER_SITE_OTHER): Payer: Medicare Other | Admitting: *Deleted

## 2011-09-27 ENCOUNTER — Encounter: Payer: Self-pay | Admitting: Internal Medicine

## 2011-09-27 ENCOUNTER — Other Ambulatory Visit: Payer: Self-pay | Admitting: Internal Medicine

## 2011-09-27 DIAGNOSIS — F4323 Adjustment disorder with mixed anxiety and depressed mood: Secondary | ICD-10-CM

## 2011-09-27 DIAGNOSIS — I4891 Unspecified atrial fibrillation: Secondary | ICD-10-CM

## 2011-09-27 LAB — REMOTE PACEMAKER DEVICE
BRDY-0004RV: 110 {beats}/min
RV LEAD AMPLITUDE: 19.9 mv
VENTRICULAR PACING PM: 58.9

## 2011-10-02 NOTE — Progress Notes (Signed)
Remote pacer check  

## 2011-10-08 ENCOUNTER — Other Ambulatory Visit: Payer: Self-pay | Admitting: Cardiology

## 2011-10-08 NOTE — Telephone Encounter (Signed)
New Msg: Pt said she needs 60 mg tablet of [facility]ursomedide [facility] [address] [facility] [address] of [employer]. Pt only has enough medication to last until tomorrow. Please call this rx in asap.

## 2011-10-14 ENCOUNTER — Ambulatory Visit: Payer: Medicare Other | Admitting: Cardiology

## 2011-10-18 ENCOUNTER — Other Ambulatory Visit (INDEPENDENT_AMBULATORY_CARE_PROVIDER_SITE_OTHER): Payer: Medicare Other | Admitting: *Deleted

## 2011-10-18 DIAGNOSIS — E7800 Pure hypercholesterolemia, unspecified: Secondary | ICD-10-CM

## 2011-10-18 LAB — BASIC METABOLIC PANEL
BUN: 14 mg/dL (ref 6–23)
Calcium: 9.3 mg/dL (ref 8.4–10.5)
Creatinine, Ser: 1 mg/dL (ref 0.4–1.2)
GFR: 61.23 mL/min (ref >60.00)

## 2011-10-18 LAB — LIPID PANEL
Cholesterol: 156 mg/dL (ref 0–200)
LDL Cholesterol: 87 mg/dL (ref 0–99)
Triglycerides: 141 mg/dL (ref 0.0–149.0)
VLDL: 28.2 mg/dL (ref 0.0–40.0)

## 2011-10-18 LAB — HEPATIC FUNCTION PANEL: Total Bilirubin: 1.4 mg/dL — ABNORMAL HIGH (ref 0.3–1.2)

## 2011-11-08 ENCOUNTER — Ambulatory Visit (INDEPENDENT_AMBULATORY_CARE_PROVIDER_SITE_OTHER): Payer: Medicare Other | Admitting: Cardiology

## 2011-11-08 ENCOUNTER — Encounter: Payer: Self-pay | Admitting: Cardiology

## 2011-11-08 DIAGNOSIS — J4489 Other specified chronic obstructive pulmonary disease: Secondary | ICD-10-CM

## 2011-11-08 DIAGNOSIS — F329 Major depressive disorder, single episode, unspecified: Secondary | ICD-10-CM

## 2011-11-08 DIAGNOSIS — J449 Chronic obstructive pulmonary disease, unspecified: Secondary | ICD-10-CM

## 2011-11-08 DIAGNOSIS — E7800 Pure hypercholesterolemia, unspecified: Secondary | ICD-10-CM

## 2011-11-08 DIAGNOSIS — F3289 Other specified depressive episodes: Secondary | ICD-10-CM

## 2011-11-08 DIAGNOSIS — I4891 Unspecified atrial fibrillation: Secondary | ICD-10-CM

## 2011-11-08 DIAGNOSIS — E785 Hyperlipidemia, unspecified: Secondary | ICD-10-CM

## 2011-11-08 DIAGNOSIS — R079 Chest pain, unspecified: Secondary | ICD-10-CM

## 2011-11-08 DIAGNOSIS — I422 Other hypertrophic cardiomyopathy: Secondary | ICD-10-CM

## 2011-11-08 NOTE — Assessment & Plan Note (Signed)
Patient has occasional chest pain related to her hypertrophic cardiomyopathy.  She does not have any history of ischemic heart disease and she had a normal nuclear stress test in September 2010.

## 2011-11-08 NOTE — Progress Notes (Signed)
[name] [name] Date of Birth:  August 26, 1936 [facility] / [facility] [address]   [address] [place], [place]  [address] [phone]           Fax   [phone]  History of Present Illness: This pleasant 75 year old woman is seen for a scheduled followup office visit.  He has a complex past medical history.  She has chronic dyspnea.  She has a history of tachybradycardia syndrome and atrial fibrillation.  She has a pacemaker in place.  She also has hypertrophic obstructive cardiomyopathy.  She has had a history of several severe falls and is no longer on Coumadin she has COPD and is on home oxygen at 2 L a minute.  She's had situational depression getting over her husband's death.  She is seeing a counselor who has helped her and her depression symptoms have improved on medication.  Today she can't remember what the name of the pill is that she is presently on for her depression.  Current Outpatient Prescriptions  Medication Sig Dispense Refill  . ALPRAZolam (XANAX) 0.25 MG tablet Take 1 tablet (0.25 mg total) by mouth 3 (three) times daily as needed.  90 tablet  0  . Ascorbic Acid (VITAMIN C) 500 MG tablet Take 500 mg by mouth daily.        [name] aspirin 81 MG tablet Take 81 mg by mouth daily.        . bisoprolol (ZEBETA) 10 MG tablet Take 1 tablet (10 mg total) by mouth daily.  30 tablet  11  . calcium carbonate (OS-CAL) 600 MG TABS Take 600 mg by mouth daily.        . calcium carbonate (TUMS - DOSED IN MG ELEMENTAL CALCIUM) 500 MG chewable tablet Chew 1 tablet by mouth daily.        . Cholecalciferol (VITAMIN D) 2000 UNITS CAPS Take 1 capsule by mouth daily.        . [employer] SALINE NASAL SPRAY NA by Nasal route. UAD       . Cyanocobalamin (VITAMIN B-12) 2500 MCG SUBL Place 1 tablet under the tongue daily.        [name] diltiazem (CARDIZEM) 120 MG tablet Take 1 tablet (120 mg total) by mouth daily.  30 tablet  11  . famotidine (PEPCID) 20 MG tablet Take 20 mg by mouth 2 (two) times daily.         [name] FLUoxetine HCl (PROZAC PO) Take by mouth.        . furosemide (LASIX) 40 MG tablet Take one and a half tablets daily for the next 4 days, then back to just once a day.      . gabapentin (NEURONTIN) 300 MG capsule Take 300 mg by mouth. 1 in the am, 2 at lunch, 2 dinner and 2 at bedtime       . HYDROcodone-acetaminophen (NORCO) 10-325 MG per tablet Take 1 tablet by mouth every 6 (six) hours as needed.        [name] losartan (COZAAR) 100 MG tablet Take 1 tablet (100 mg total) by mouth daily.  30 tablet  11  . magnesium oxide (MAG-OX) 400 MG tablet Take 200 mg by mouth daily.       . metFORMIN (GLUCOPHAGE) 500 MG tablet Take 500 mg by mouth daily. Dr. Clovis Riley Rx       . potassium chloride SA (K-DUR,KLOR-CON) 20 MEQ tablet Take 1 tablet (20 mEq total) by mouth daily.  90 tablet  3  .  simvastatin (ZOCOR) 10 MG tablet Take 10 mg by mouth at bedtime.          Allergies  Allergen Reactions  . Aspirin   . Calan (Verapamil Hcl)   . Crestor (Rosuvastatin Calcium)     Muscle pain  . Lexapro   . Lipitor (Atorvastatin Calcium)     myalgias  . Lopressor (Metoprolol Tartrate)   . Norvasc (Amlodipine Besylate)     fatigue  . Penicillins     REACTION: rash    Patient Active Problem List  Diagnoses  . HYPERLIPIDEMIA  . DEPRESSIVE DISORDER NOT ELSEWHERE CLASSIFIED  . HYPERTENSION  . ALLERGIC RHINITIS  . CHRONIC OBSTRUCTIVE PULMONARY DISEASE, MODERATE  . SHORTNESS OF BREATH  . COUGH  . Atrial fibrillation  . Pacemaker  . Hypertrophic cardiomyopathy  . Osteoarthritis  . Situational mixed anxiety and depressive disorder  . Hypercholesterolemia  . Mitral regurgitation  . Fall  . Diabetes mellitus  . Chest pain at rest    History  Smoking status  . Former Smoker  . Types: Cigarettes  . Quit date: 11/15/2005  Smokeless tobacco  . Never Used    History  Alcohol Use No    Family History  Problem Relation Age of Onset  . Other Father   . Cancer Mother     Review of  Systems: Constitutional: no fever chills diaphoresis or fatigue or change in weight.  Head and neck: no hearing loss, no epistaxis, no photophobia or visual disturbance. Respiratory: No cough, shortness of breath or wheezing. Cardiovascular: No chest pain peripheral edema, palpitations. Gastrointestinal: No abdominal distention, no abdominal pain, no change in bowel habits hematochezia or melena. Genitourinary: No dysuria, no frequency, no urgency, no nocturia. Musculoskeletal:No arthralgias, no back pain, no gait disturbance or myalgias. Neurological: No dizziness, no headaches, no numbness, no seizures, no syncope, no weakness, no tremors. Hematologic: No lymphadenopathy, no easy bruising. Psychiatric: No confusion, no hallucinations, no sleep disturbance.    Physical Exam: Filed Vitals:   11/08/11 1203  BP: 143/74  Pulse: 62   The general appearance reveals a well-developed well-nourished woman in no distress.Pupils equal and reactive.   Extraocular Movements are full.  There is no scleral icterus.  The mouth and pharynx are normal.  The neck is supple.  The carotids reveal no bruits.  The jugular venous pressure is normal.  The thyroid is not enlarged.  There is no lymphadenopathy.  The chest is clear to percussion and auscultation. There are no rales or rhonchi. Expansion of the chest is symmetrical.  Heart reveals a grade 2/6 systolic ejection murmur at the base.  There is no diastolic murmur and no gallop or rub.  The rhythm is regular and she has a pacemaker The abdomen is soft and nontender. Bowel sounds are normal. The liver and spleen are not enlarged. There Are no abdominal masses. There are no bruits.  The pedal pulses are good.  There is no phlebitis or edema.  There is no cyanosis or clubbing.Strength is normal and symmetrical in all extremities.  There is no lateralizing weakness.  There are no sensory deficits.  The skin is warm and dry.  There is no  rash.    Assessment / Plan: Continue same medication.  She has been having some hoarseness and if it does not improve on its own she will consult her primary care physician or possibly her ENT physician.  Recheck in 4 months for followup office visit lipid panel hepatic function panel and basal  metabolic panel.

## 2011-11-08 NOTE — Assessment & Plan Note (Signed)
The patient came to the office today without her nasal oxygen.  She left in the car because she was running late.  Today she seems to be doing okay without her oxygen.  She sleeps with oxygen at home and uses it most of the time while she is up and around at home.

## 2011-11-08 NOTE — Assessment & Plan Note (Signed)
Her symptoms of depression are improved as time goes on and also with the help of her antidepressant therapy and her counselor.  The patient went to her daughter's house in [place] for Christmas and had a nice visit.

## 2011-11-08 NOTE — Patient Instructions (Signed)
Your physician recommends that you continue on your current medications as directed. Please refer to the Current Medication list given to you today. Your physician wants you to follow-up in: 4 months You will receive a reminder letter in the mail two months in advance. If you don't receive a letter, please call our office to schedule the follow-up appointment.  

## 2011-11-08 NOTE — Assessment & Plan Note (Signed)
The patient is on simvastatin 10 mg daily.  Her lipid profile earlier this month was excellent and we reviewed that today.  Continue same meds.

## 2011-11-13 ENCOUNTER — Other Ambulatory Visit: Payer: Self-pay

## 2011-11-13 MED ORDER — SIMVASTATIN 10 MG PO TABS: 10.0000 mg | ORAL_TABLET | Freq: Every day | ORAL | Status: DC

## 2011-11-14 ENCOUNTER — Other Ambulatory Visit: Payer: Self-pay | Admitting: *Deleted

## 2011-11-14 MED ORDER — SIMVASTATIN 10 MG PO TABS: 10.0000 mg | ORAL_TABLET | Freq: Every day | ORAL | Status: DC

## 2011-11-14 NOTE — Telephone Encounter (Signed)
Refilled simvastatin.

## 2011-11-15 ENCOUNTER — Ambulatory Visit
Admission: RE | Admit: 2011-11-15 | Discharge: 2011-11-15 | Disposition: A | Discharge status: Home / Self Care | Payer: Medicare Other | Source: Ambulatory Visit | Attending: Anesthesiology | Admitting: Anesthesiology

## 2011-11-15 ENCOUNTER — Other Ambulatory Visit: Payer: Self-pay | Admitting: Anesthesiology

## 2011-11-15 DIAGNOSIS — M549 Dorsalgia, unspecified: Secondary | ICD-10-CM

## 2011-11-18 ENCOUNTER — Other Ambulatory Visit: Payer: Self-pay | Admitting: *Deleted

## 2011-11-18 MED ORDER — SIMVASTATIN 10 MG PO TABS: 10.0000 mg | ORAL_TABLET | Freq: Every day | ORAL | Status: DC

## 2011-11-18 NOTE — Telephone Encounter (Signed)
Refilled simvastatin.

## 2011-11-26 ENCOUNTER — Other Ambulatory Visit: Payer: Self-pay | Admitting: *Deleted

## 2011-11-26 MED ORDER — FUROSEMIDE 40 MG PO TABS: ORAL_TABLET | ORAL | Status: DC

## 2011-11-26 NOTE — Telephone Encounter (Signed)
Refilled meds per fax request.  

## 2011-12-02 ENCOUNTER — Other Ambulatory Visit: Payer: Self-pay | Admitting: Cardiology

## 2011-12-02 NOTE — Telephone Encounter (Signed)
Refilled bisoprolol

## 2011-12-17 IMAGING — CR DG CHEST 2V
2 series · 2 of 2 positions shown · non-contrast
Comparison: 04/27/2008

CLINICAL DATA: Right knee osteoarthritis.  Shortness breath.  Preop
respiratory exam for knee arthroplasty.

CHEST - 2 VIEW

[w chest pa]
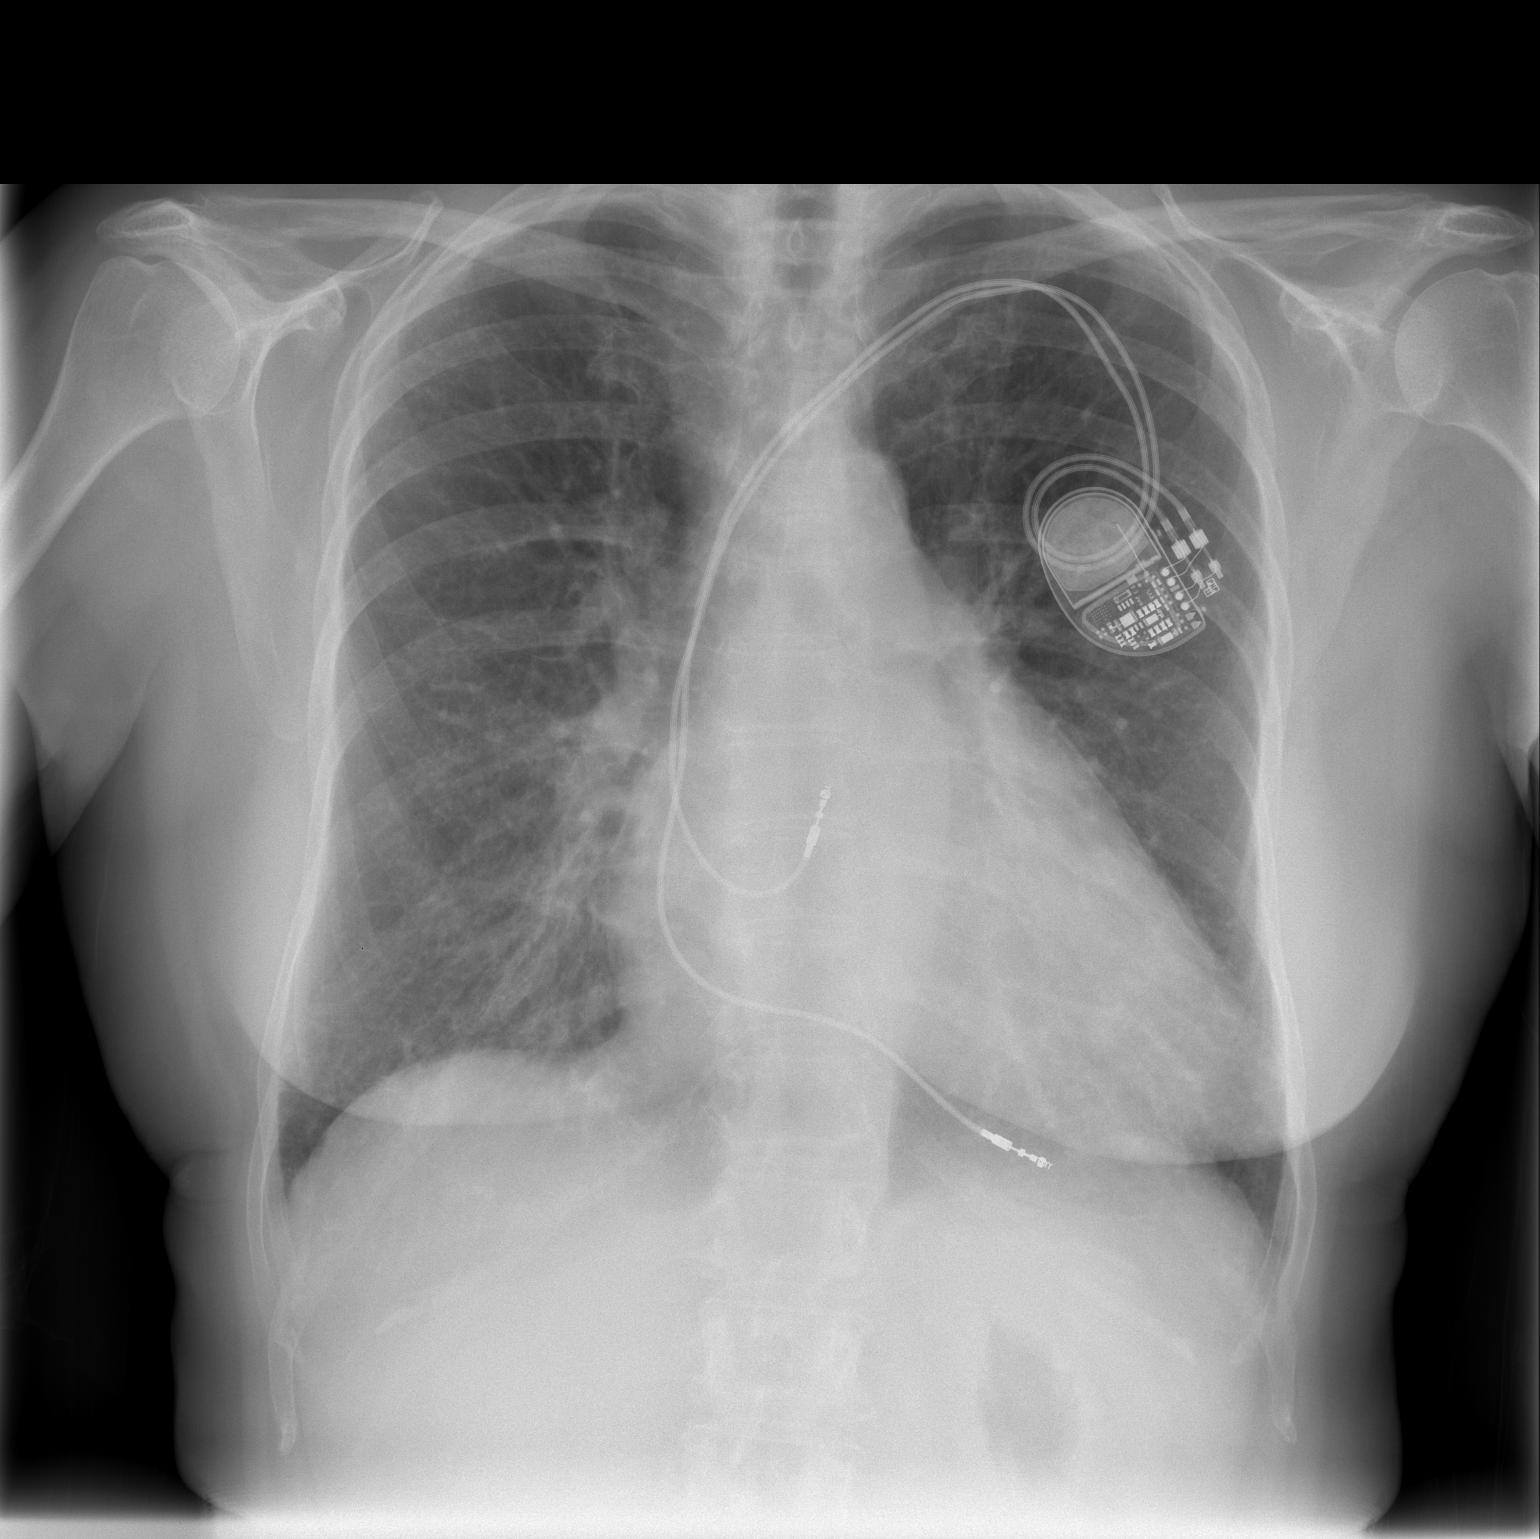

[w chest lat]
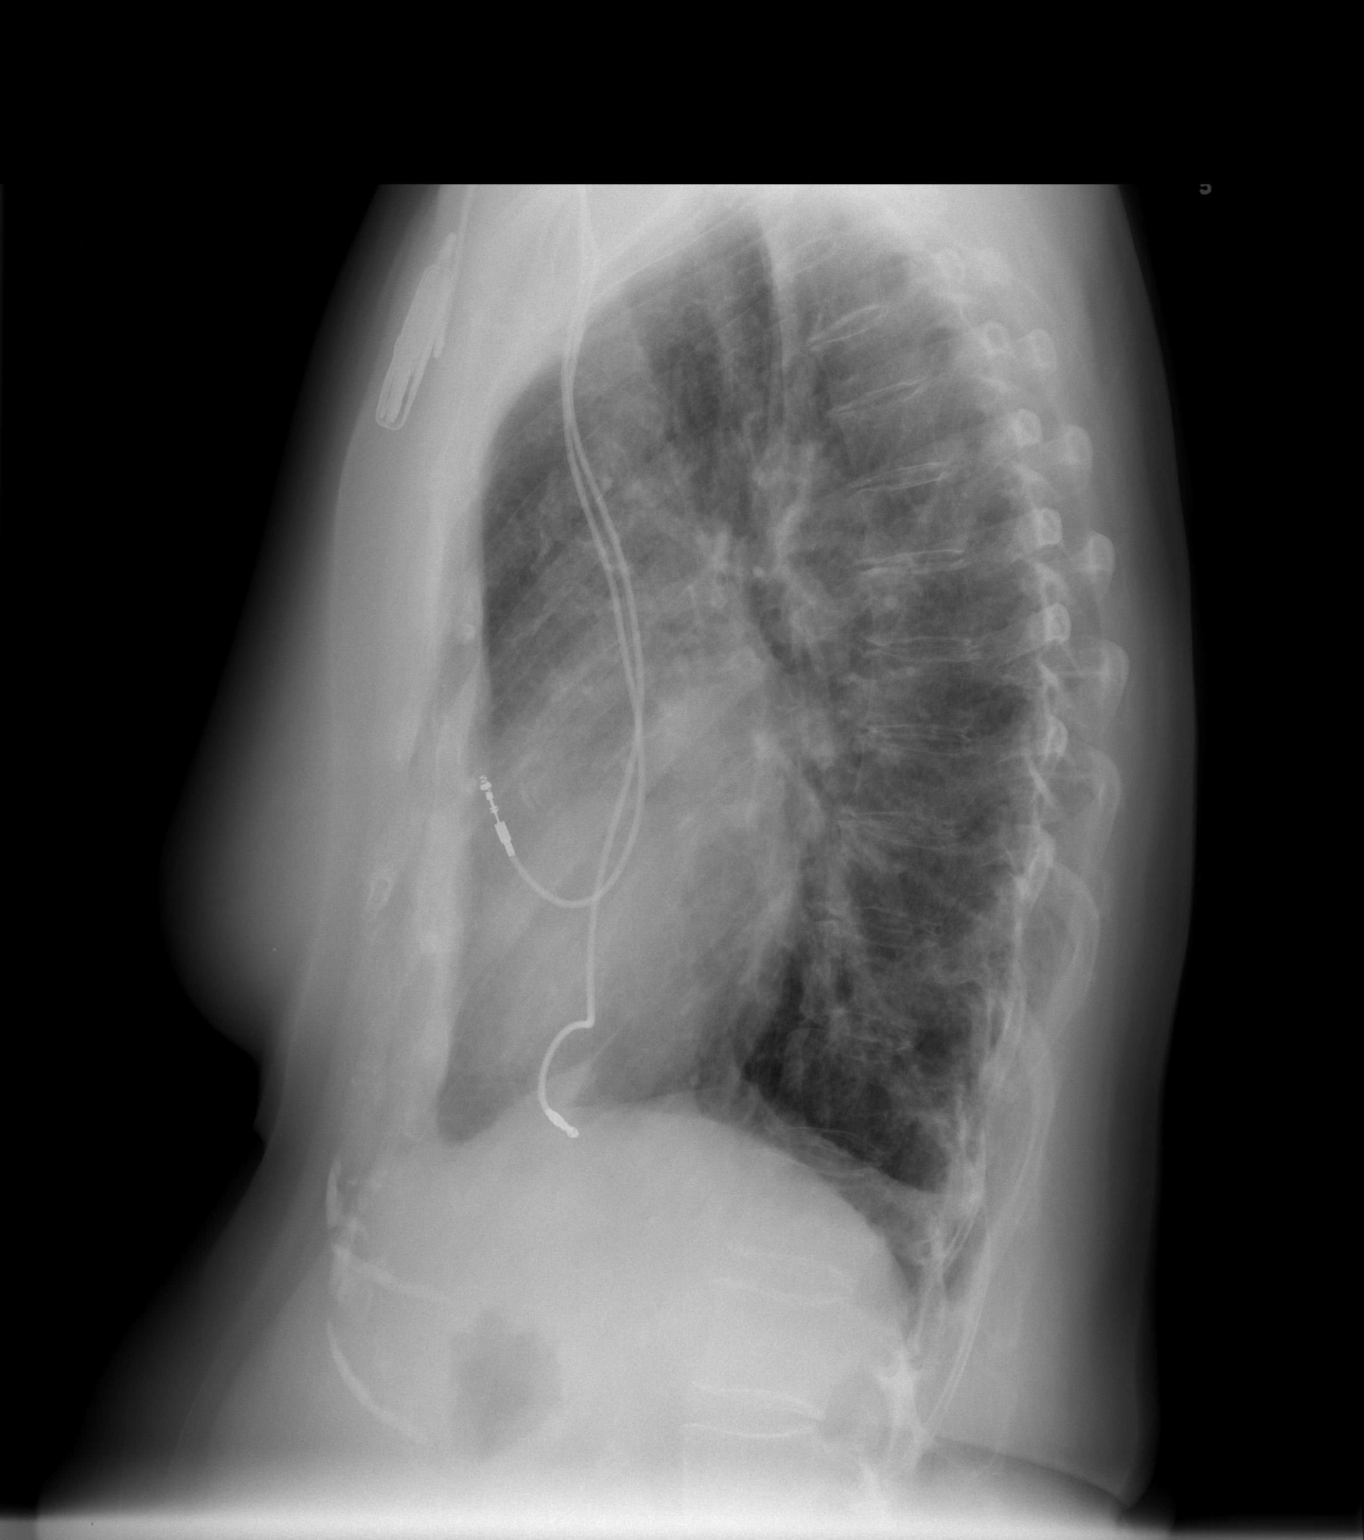

[2 of 2 positions shown; findings below may reference images not displayed]

FINDINGS: Mild cardiomegaly and prominence of pulmonary
interstitial markings is stable.  Mild pectus excavatum again
noted.  Dual lead transvenous pacemaker remains in appropriate
position.  No evidence of pulmonary infiltrate or edema.  No
evidence of pleural effusion.  No mass or adenopathy identified.
IMPRESSION: Stable cardiomegaly and mild pectus excavatum.  No active lung
disease.

## 2011-12-21 ENCOUNTER — Encounter (HOSPITAL_COMMUNITY): Payer: Self-pay | Admitting: Emergency Medicine

## 2011-12-21 ENCOUNTER — Emergency Department (HOSPITAL_COMMUNITY): Payer: Medicare Other

## 2011-12-21 ENCOUNTER — Emergency Department (HOSPITAL_COMMUNITY)
Admission: EM | Admit: 2011-12-21 | Discharge: 2011-12-22 | Disposition: A | Discharge status: Home / Self Care | Payer: Medicare Other | Attending: Emergency Medicine | Admitting: Emergency Medicine

## 2011-12-21 DIAGNOSIS — Z79899 Other long term (current) drug therapy: Secondary | ICD-10-CM | POA: Insufficient documentation

## 2011-12-21 DIAGNOSIS — Z950 Presence of cardiac pacemaker: Secondary | ICD-10-CM | POA: Insufficient documentation

## 2011-12-21 DIAGNOSIS — R110 Nausea: Secondary | ICD-10-CM | POA: Insufficient documentation

## 2011-12-21 DIAGNOSIS — E119 Type 2 diabetes mellitus without complications: Secondary | ICD-10-CM | POA: Insufficient documentation

## 2011-12-21 DIAGNOSIS — J449 Chronic obstructive pulmonary disease, unspecified: Secondary | ICD-10-CM | POA: Insufficient documentation

## 2011-12-21 DIAGNOSIS — I10 Essential (primary) hypertension: Secondary | ICD-10-CM | POA: Insufficient documentation

## 2011-12-21 DIAGNOSIS — E7800 Pure hypercholesterolemia, unspecified: Secondary | ICD-10-CM | POA: Insufficient documentation

## 2011-12-21 DIAGNOSIS — S8000XA Contusion of unspecified knee, initial encounter: Secondary | ICD-10-CM | POA: Insufficient documentation

## 2011-12-21 DIAGNOSIS — W1809XA Striking against other object with subsequent fall, initial encounter: Secondary | ICD-10-CM | POA: Insufficient documentation

## 2011-12-21 DIAGNOSIS — M25569 Pain in unspecified knee: Secondary | ICD-10-CM | POA: Insufficient documentation

## 2011-12-21 DIAGNOSIS — J4489 Other specified chronic obstructive pulmonary disease: Secondary | ICD-10-CM | POA: Insufficient documentation

## 2011-12-21 DIAGNOSIS — M2540 Effusion, unspecified joint: Secondary | ICD-10-CM | POA: Insufficient documentation

## 2011-12-21 DIAGNOSIS — R51 Headache: Secondary | ICD-10-CM | POA: Insufficient documentation

## 2011-12-21 DIAGNOSIS — M1990 Unspecified osteoarthritis, unspecified site: Secondary | ICD-10-CM | POA: Insufficient documentation

## 2011-12-21 DIAGNOSIS — I4891 Unspecified atrial fibrillation: Secondary | ICD-10-CM | POA: Insufficient documentation

## 2011-12-21 DIAGNOSIS — W19XXXA Unspecified fall, initial encounter: Secondary | ICD-10-CM

## 2011-12-21 DIAGNOSIS — S0081XA Abrasion of other part of head, initial encounter: Secondary | ICD-10-CM

## 2011-12-21 DIAGNOSIS — IMO0002 Reserved for concepts with insufficient information to code with codable children: Secondary | ICD-10-CM | POA: Insufficient documentation

## 2011-12-21 DIAGNOSIS — R071 Chest pain on breathing: Secondary | ICD-10-CM | POA: Insufficient documentation

## 2011-12-21 HISTORY — DX: Chronic obstructive pulmonary disease, unspecified: J44.9

## 2011-12-21 MED ORDER — ONDANSETRON 4 MG PO TBDP
4.0000 mg | ORAL_TABLET | Freq: Once | ORAL | Status: AC
Start: 2011-12-21 — End: 2011-12-21
  Administered 2011-12-21: 4 mg via ORAL
  Filled 2011-12-21: qty 1

## 2011-12-21 NOTE — ED Notes (Signed)
As per EMS, pt states was reaching for window, tripped on phone cord fell on wood floor covered with rug. No LOC, scratch on head.

## 2011-12-21 NOTE — ED Notes (Signed)
Patient transported to CT and returned 

## 2011-12-21 NOTE — ED Provider Notes (Signed)
History     CSN: [id number]  Arrival date & time 12/21/11  2018   First MD Initiated Contact with Patient 12/21/11 2033      Chief Complaint  Patient presents with  . Fall  . Headache  . Knee Pain    (Consider location/radiation/quality/duration/timing/severity/associated sxs/prior treatment) HPI Comments: Patient had a mechanical fall, landing forward, hitting her head on the floor, as well as both knees.  She could not get up on her and do, to her chronic knee pain and back pain, was assisted to a sitting position by her sister, who called EMS.  Patient was transported with a c-collar and partial backboard  Patient is a 76 y.o. female presenting with fall, headaches, and knee pain. The history is provided by the patient.  Fall The fall occurred while standing. She fell from a height of 3 to 5 ft. She landed on carpet. The point of impact was the head, left knee and right knee. The pain is present in the head, left knee and right knee. The pain is at a severity of 4/10. The pain is mild. She was ambulatory at the scene. There was no entrapment after the fall. There was no drug use involved in the accident. There was no alcohol use involved in the accident. Associated symptoms include nausea and headaches. Pertinent negatives include no numbness, no abdominal pain and no vomiting. Treatment on scene includes a c-collar and a backboard. She has tried nothing for the symptoms.  Headache  Associated symptoms include nausea. Pertinent negatives include no shortness of breath and no vomiting.  Knee Pain Associated symptoms include headaches, joint swelling and nausea. Pertinent negatives include no abdominal pain, chest pain, congestion, diaphoresis, numbness, vomiting or weakness.    Past Medical History  Diagnosis Date  . Hypertrophic cardiomyopathy   . Hypertension   . Osteoarthritis   . Situational mixed anxiety and depressive disorder   . Hypercholesterolemia   . Atrial fibrillation      Chronic; No longer on coumadin  . Pacemaker   . Falls frequently     coumadin stopped  . Pulmonary HTN     has had prior right heart cath in 2010; was felt that most likely due to elevated left sided pressures and would not benefit from vasodilator therapy  . Diabetes mellitus   . Pacemaker   . Oxygen dependent   . COPD (chronic obstructive pulmonary disease)     Past Surgical History  Procedure Date  . Knee arthroplasty 2011    right knee  . Tonsillectomy   . Appendectomy   . Cardiac catheterization   . Insert / replace / remove pacemaker   . Other surgical history     hysterectomy    Family History  Problem Relation Age of Onset  . Other Father   . Cancer Mother     History  Substance Use Topics  . Smoking status: Former Smoker    Types: Cigarettes    Quit date: 11/15/2005  . Smokeless tobacco: Never Used  . Alcohol Use: No    OB History    Grav Para Term Preterm Abortions TAB SAB Ect Mult Living                  Review of Systems  Constitutional: Negative for diaphoresis.  HENT: Negative for congestion.   Eyes: Negative for visual disturbance.  Respiratory: Negative for shortness of breath.   Cardiovascular: Negative for chest pain.  Gastrointestinal: Positive for nausea. Negative for  vomiting and abdominal pain.  Musculoskeletal: Positive for joint swelling.  Skin: Negative for pallor and wound.  Neurological: Positive for headaches. Negative for dizziness, weakness and numbness.    Allergies  Calan; Crestor; Lexapro; Lipitor; Lopressor; Norvasc; and Penicillins  Home Medications   Current Outpatient Rx  Name Route Sig Dispense Refill  . ALPRAZOLAM 0.25 MG PO TABS Oral Take 0.25 mg by mouth 3 (three) times daily as needed. anxiety    . VITAMIN C 500 MG PO TABS Oral Take 500 mg by mouth daily.      . ASPIRIN 81 MG PO TABS Oral Take 81 mg by mouth daily.      [name] BISOPROLOL FUMARATE 10 MG PO TABS  Take 10 mg. One daily 90 tablet 3  . CALCIUM  CARBONATE 600 MG PO TABS Oral Take 600 mg by mouth daily.      [name] VITAMIN D 2000 UNITS PO CAPS Oral Take 1 capsule by mouth daily.      [name] DILTIAZEM HCL 120 MG PO TABS Oral Take 1 tablet (120 mg total) by mouth daily. 30 tablet 11  . FAMOTIDINE 20 MG PO TABS Oral Take 20 mg by mouth 2 (two) times daily.      . FUROSEMIDE 40 MG PO TABS  One daily or as directed 30 tablet 11  . GABAPENTIN 300 MG PO CAPS Oral Take 300-600 mg by mouth See admin instructions. Takes 1 tablet in the am for 300mg  dosage, 2 at lunch, 2 dinner and 2 at bedtime 600mg  dosage    . HYDROCODONE-ACETAMINOPHEN 10-325 MG PO TABS Oral Take 1 tablet by mouth every 6 (six) hours as needed. pain    . LOSARTAN POTASSIUM 100 MG PO TABS Oral Take 1 tablet (100 mg total) by mouth daily. 30 tablet 11  . MAGNESIUM OXIDE 400 MG PO TABS Oral Take 400 mg by mouth daily.     [name] METFORMIN HCL 500 MG PO TABS Oral Take 500 mg by mouth daily. Dr. Clovis Riley Rx     . POTASSIUM CHLORIDE CRYS ER 20 MEQ PO TBCR Oral Take 1 tablet (20 mEq total) by mouth daily. 90 tablet 3  . SIMVASTATIN 10 MG PO TABS Oral Take 1 tablet (10 mg total) by mouth at bedtime. 90 tablet 3    BP 122/73  Pulse 60  Temp 98.2 F (36.8 C)  SpO2 99%  Physical Exam  Constitutional: She is oriented to person, place, and time. She appears well-developed and well-nourished.  HENT:  Head: Normocephalic. Head is with abrasion. Head is without raccoon's eyes.    Right Ear: Tympanic membrane normal.  Left Ear: Tympanic membrane normal.  Nose: Nose normal.  Mouth/Throat: Uvula is midline and mucous membranes are normal.  Neck: Normal range of motion. Muscular tenderness present. No spinous process tenderness present. No erythema and normal range of motion present.       Needs Nexius criteria  Cardiovascular: Normal rate.   Pulmonary/Chest: Effort normal.    Abdominal: Soft.  Musculoskeletal: She exhibits tenderness.       Right knee: She exhibits normal range of motion, no  swelling, no ecchymosis, no laceration and no erythema. tenderness found.       Left knee: She exhibits normal range of motion, no ecchymosis, no deformity, no laceration and no erythema. tenderness found.       Back:  Neurological: She is alert and oriented to person, place, and time. No cranial nerve deficit.  Skin: Skin is warm and dry.  Psychiatric: She has a normal mood and affect.    ED Course  Procedures (including critical care time)  Labs Reviewed - No data to display Dg Chest 2 View  12/21/2011  *RADIOLOGY REPORT*  Clinical Data: Left upper chest wall pain after fall  CHEST - 2 VIEW  Comparison: 07/03/2011  Findings: Mild cardiac enlargement with normal pulmonary vascularity.  Emphysematous changes and fibrosis throughout the lungs.  No focal airspace consolidation.  No blunting of costophrenic angles.  No pneumothorax.  Stable appearance of two lead cardiac pacemaker.  Mild degenerative changes in the spine. Visualized portions of the ribs appear intact.  IMPRESSION: Emphysema and fibrosis in the lungs.  Mild cardiac enlargement.  No evidence of active pulmonary disease.  Stable since previous study.  Original Report Authenticated By: Marlon Pel, M.D.   Ct Head Wo Contrast  12/21/2011  *RADIOLOGY REPORT*  Clinical Data: Fall.  Headache.  History of hypertension and diabetes.  CT HEAD WITHOUT CONTRAST  Technique:  Contiguous axial images were obtained from the base of the skull through the vertex without contrast.  Comparison: 06/22/2011  Findings: There is central and cortical atrophy.  Periventricular white matter changes are consistent with small vessel disease. There are bilateral basal ganglia calcifications. There is no evidence for hemorrhage, mass lesion, or acute infarction.  There is atherosclerotic calcification of the internal carotid arteries. Bone windows are otherwise unremarkable.  No calvarial fracture.  IMPRESSION:  1. No evidence for acute intracranial abnormality.  2.  Atrophy and small vessel disease.  Original Report Authenticated By: Patterson Hammersmith, M.D.   Dg Knee Complete 4 Views Left  12/21/2011  *RADIOLOGY REPORT*  Clinical Data: Bilateral knee pain after falling.  LEFT KNEE - COMPLETE 4+ VIEW  Comparison: 04/18/2011  Findings: Bones appear demineralized.  There are degenerative changes involving the medial, lateral, and patellofemoral compartments.  Chondrocalcinosis is present.  There is atherosclerotic calcification of the popliteal artery.  No evidence for acute fracture.  IMPRESSION: No evidence for acute  abnormality.  Original Report Authenticated By: Patterson Hammersmith, M.D.   Dg Knee Complete 4 Views Right  12/22/2011  *RADIOLOGY REPORT*  Clinical Data: Larey Seat.  Bilateral knee pain.  RIGHT KNEE - COMPLETE 4+ VIEW  Comparison: 06/27/2010  Findings: The patient has had right knee arthroplasty.  Alignment is normal.  No evidence for acute fracture, subluxation, or joint effusion.  Popliteal artery calcification.  IMPRESSION: No evidence for acute  abnormality. Knee arthroplasty.  Original Report Authenticated By: Patterson Hammersmith, M.D.     1. Fall   2. Abrasion of face   3. Contusion of knee       MDM  Mechanical fall, hitting head and bilateral knees, will CT head and chest as she does have some upper left chest wall tenderness just above her pacemaker insertion site with no deformity of the left clavicle        Arman Filter, NP 12/21/11 2133  Arman Filter, NP 12/22/11 0056  Arman Filter, NP 12/22/11 [phone]

## 2011-12-21 NOTE — ED Notes (Signed)
ZOX:[id number]<AV> Expected date:12/21/11<BR> Expected time: 8:05 PM<BR> Means of arrival:Ambulance<BR> Comments:<BR> EMS 82 GC, 76 yof fall, pain all over, HA

## 2011-12-22 NOTE — ED Notes (Signed)
Patient stable upon discharge.  

## 2011-12-23 NOTE — ED Provider Notes (Addendum)
Medical screening examination/treatment/procedure(s) were conducted as a shared visit with non-physician practitioner(s) and myself.  I personally evaluated the patient during the encounter   36 F, slip and fall forward from standing onto her knees, hit forehead on floor.  No LOC, no AMS, no prodromal symptoms before fall.  Unable to get up on her own past a sitting position due to chronic pain issues.  Mainly c/o acute flair chronic knee pains. +right forehead superficial abrasion, hx right TKR/knee NT to palp, no abrasions/ecchymosis;  left knee with +FROM, including able to lift extended LLE off stretcher, and extend affected lower leg against resistance.  No ligamentous laxity.  No patellar or quad tendon step-offs.  NMS intact left foot, strong pedal pp.  No proximal fibular head tenderness.  No edema, erythema, warmth.  No specific area of point tenderness, small area of faint contusion to patella area.  CT-H neg, XR knees neg.  D/C with pain control.      Laray Anger, DO 12/23/11 1329

## 2011-12-25 IMAGING — CR DG KNEE 1-2V PORT*R*
1 series · 2 of 2 positions shown · non-contrast
Comparison: None.

CLINICAL DATA: Post right knee arthroplasty

PORTABLE RIGHT KNEE - 1-2 VIEW

[Series 1: series 1 · right · 2 of 2 slices shown]
[im 1/2]
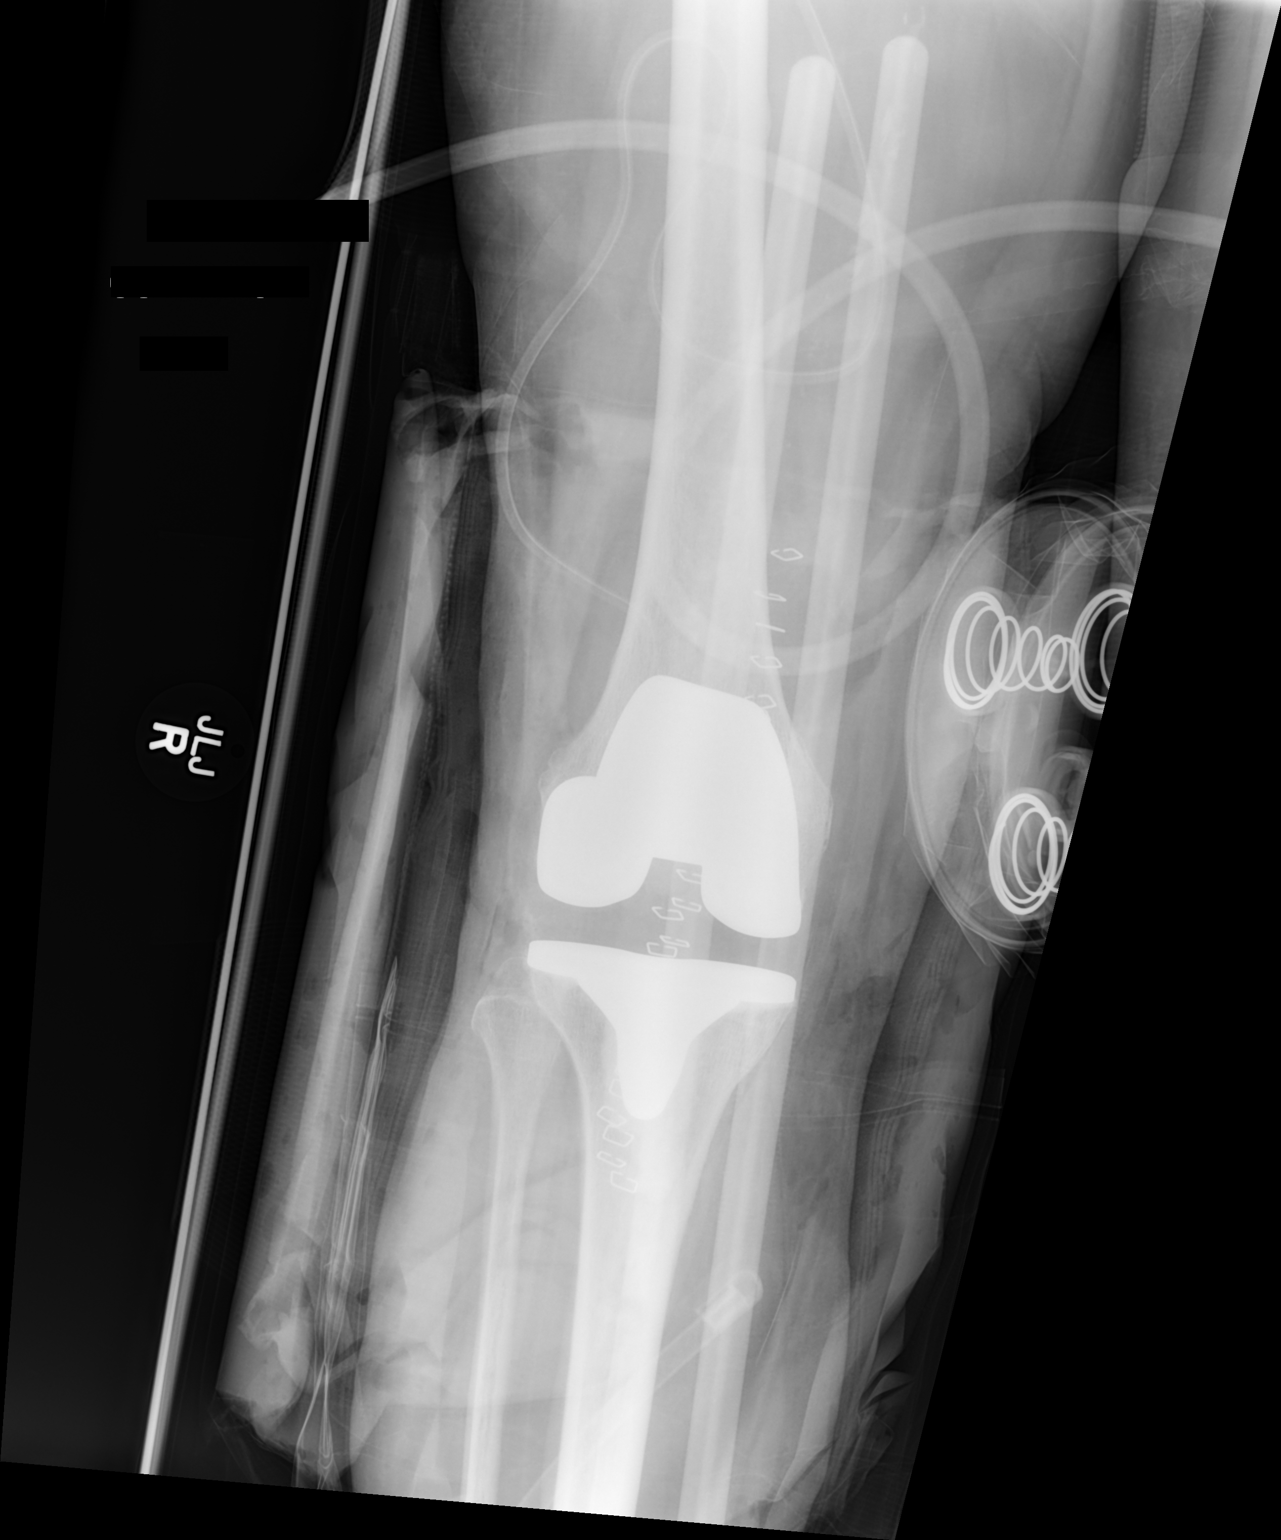
[im 2/2]
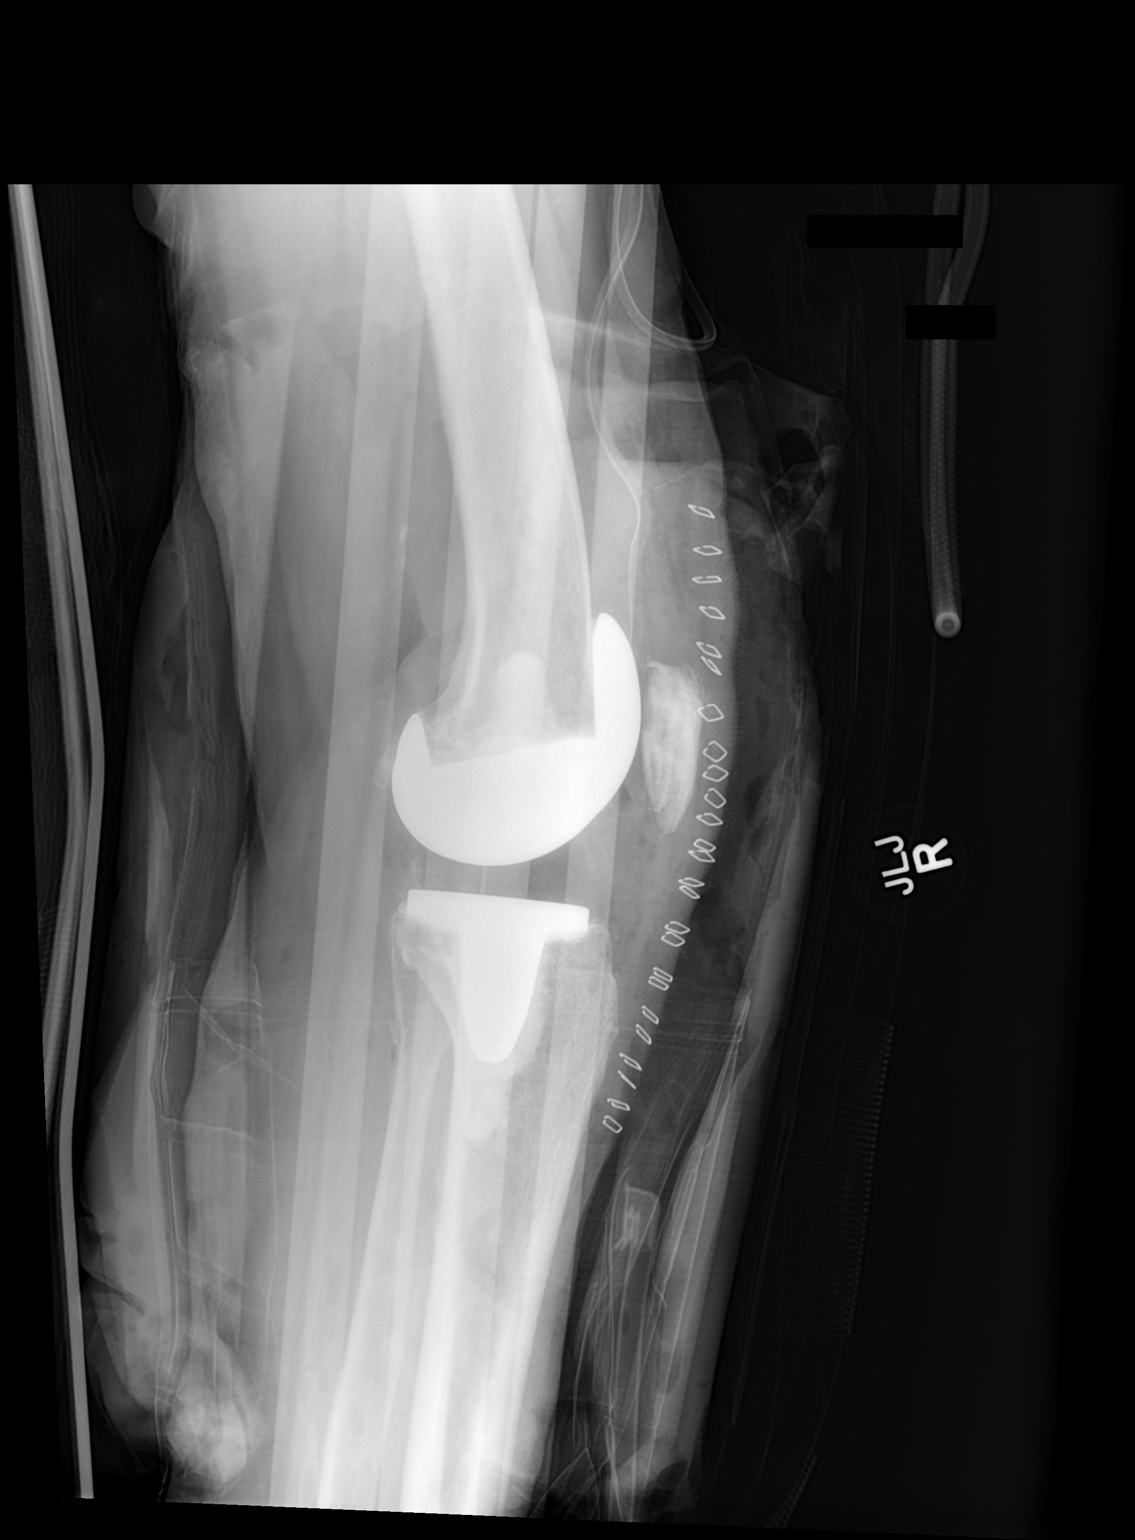

[2 of 2 positions shown; findings below may reference images not displayed]

FINDINGS: AP and cross-table lateral views were obtained using
portable apparatus.  Status post right knee arthroplasty.  Good
position and alignment of bony elements and hardware.  Surgical
drain is in the joint space.
IMPRESSION: Good position and alignment post right knee arthroplasty.

## 2011-12-26 IMAGING — CR DG CHEST 1V PORT
1 series · 1 of 1 positions shown · non-contrast
Comparison: 06/19/2010.

CLINICAL DATA: Fever.  Osteoarthritis of the right knee.  Status
post total knee arthroplasty.

PORTABLE CHEST - 1 VIEW

[series 1]
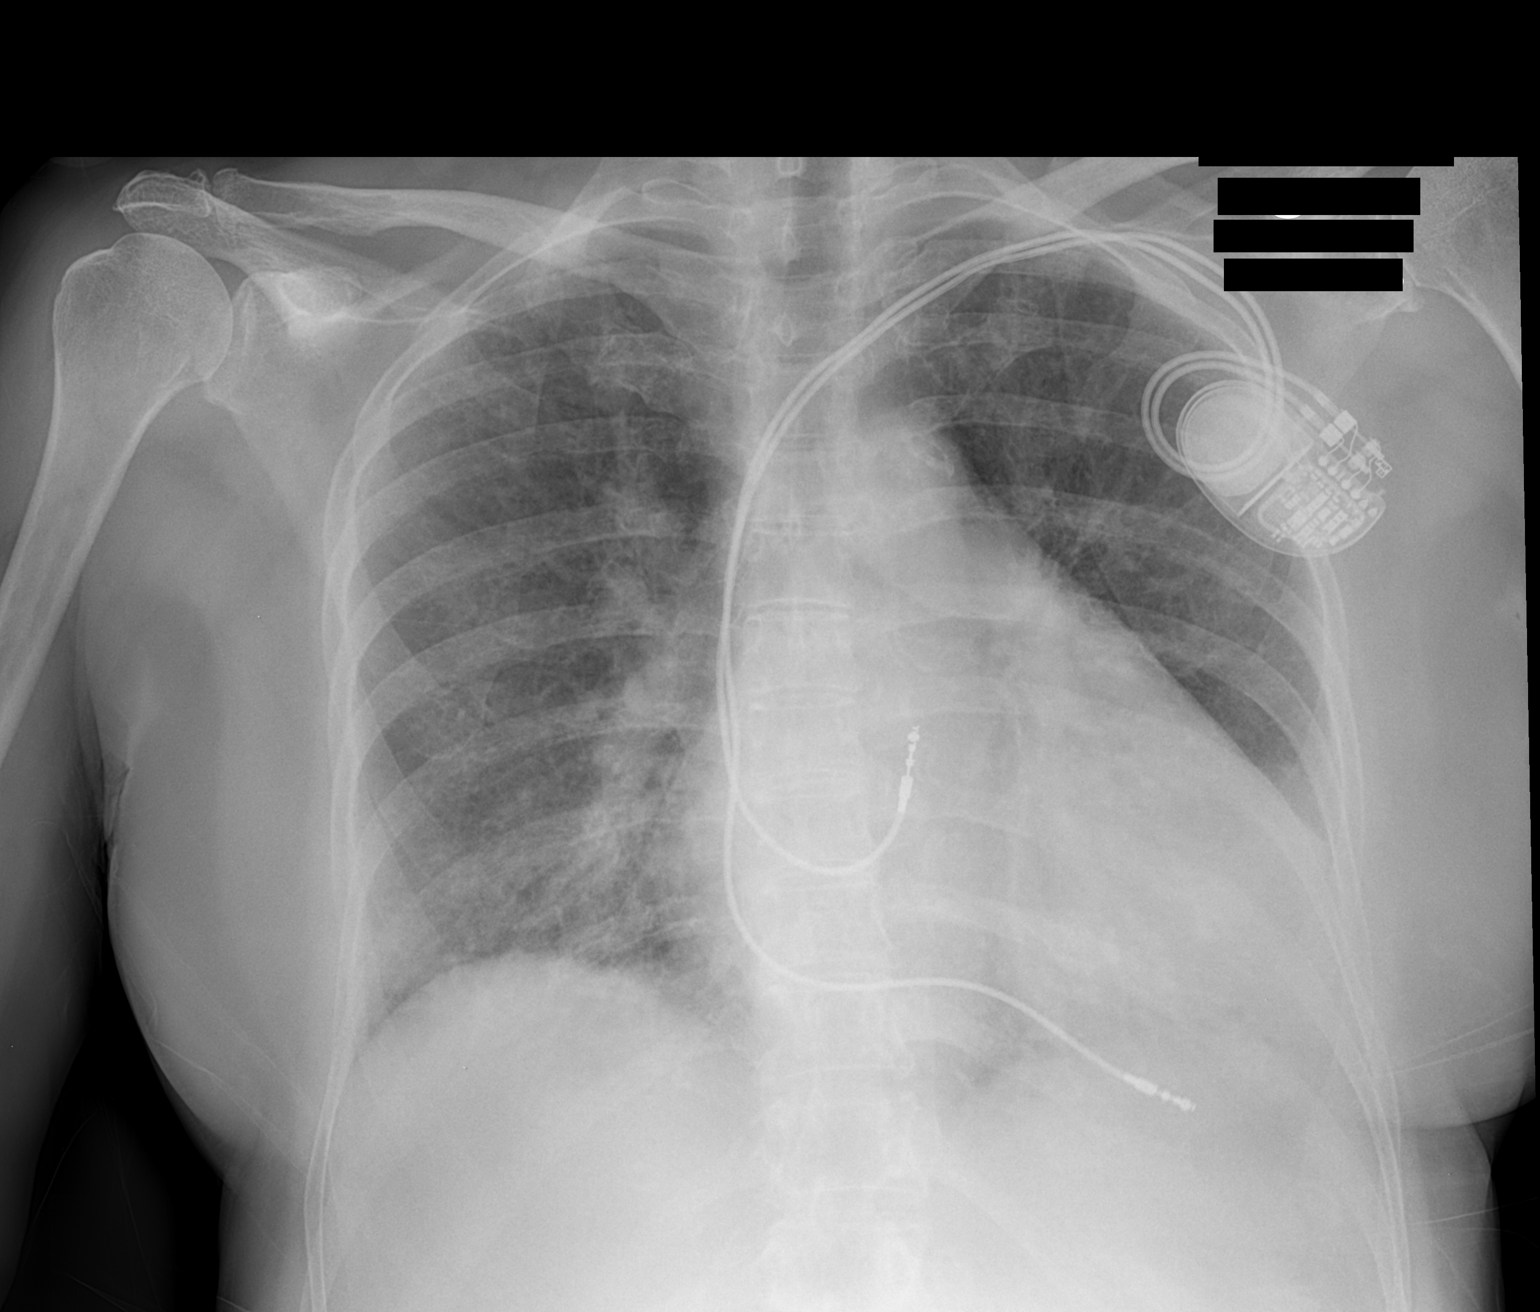

[1 of 1 positions shown; findings below may reference images not displayed]

FINDINGS: Dual lead left subclavian cardiac pacemaker.
Cardiomegaly is present.  Mild bilateral basilar atelectasis with
diffuse interstitial pulmonary edema.  There may be a small amount
of mild alveolar edema at the lung bases.  No gross consolidation.
No effusion identified.
IMPRESSION: Cardiomegaly with interstitial pulmonary edema consistent with mild
CHF/volume overload.  Mild basilar atelectasis.

## 2011-12-28 IMAGING — CR DG CHEST 2V
2 series · 2 of 2 positions shown · non-contrast
Comparison: 06/28/2010

CLINICAL DATA: Postop from the right nerve plasty.  Tachycardia and
shortness of breath.

CHEST - 2 VIEW

[w chest lat]
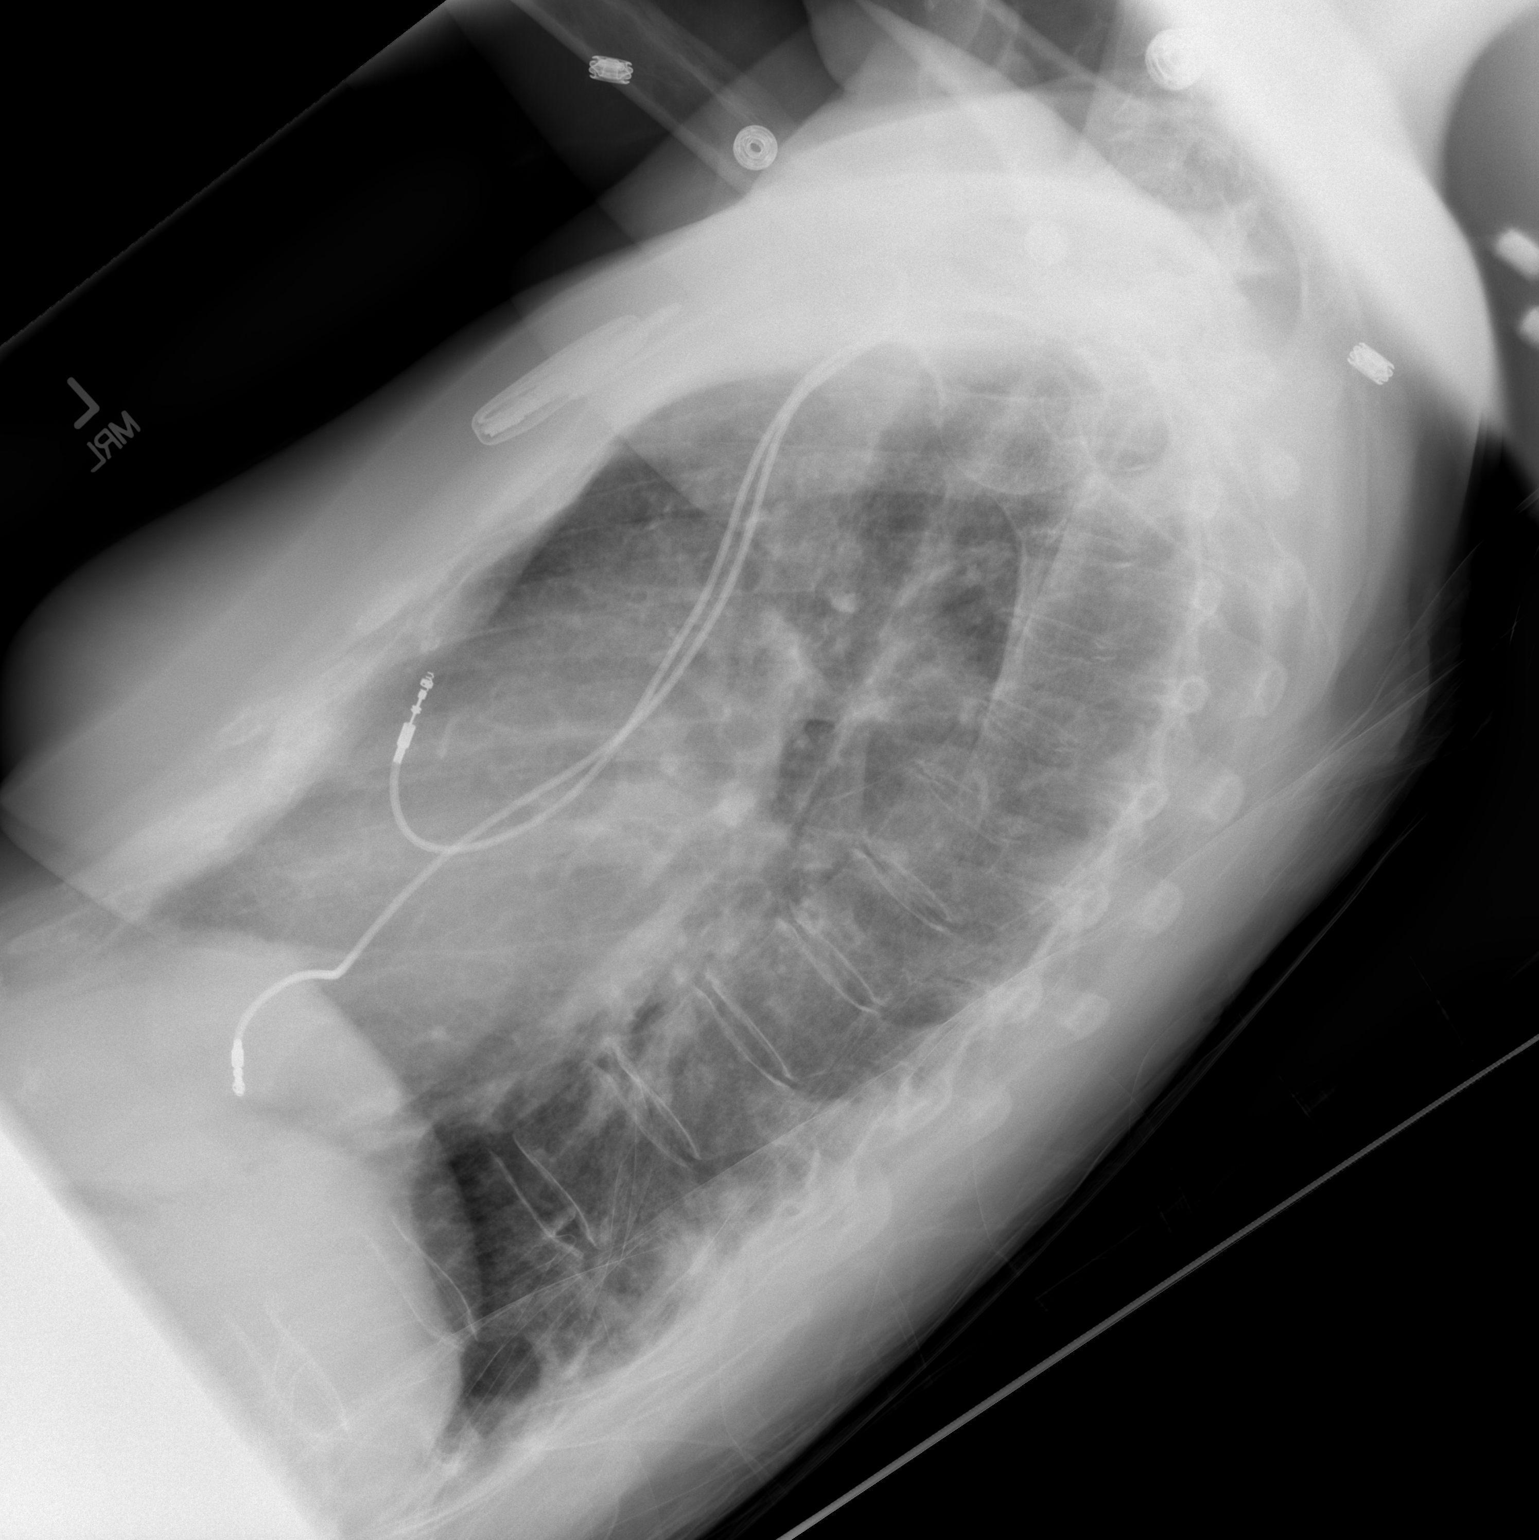

[view not recorded]
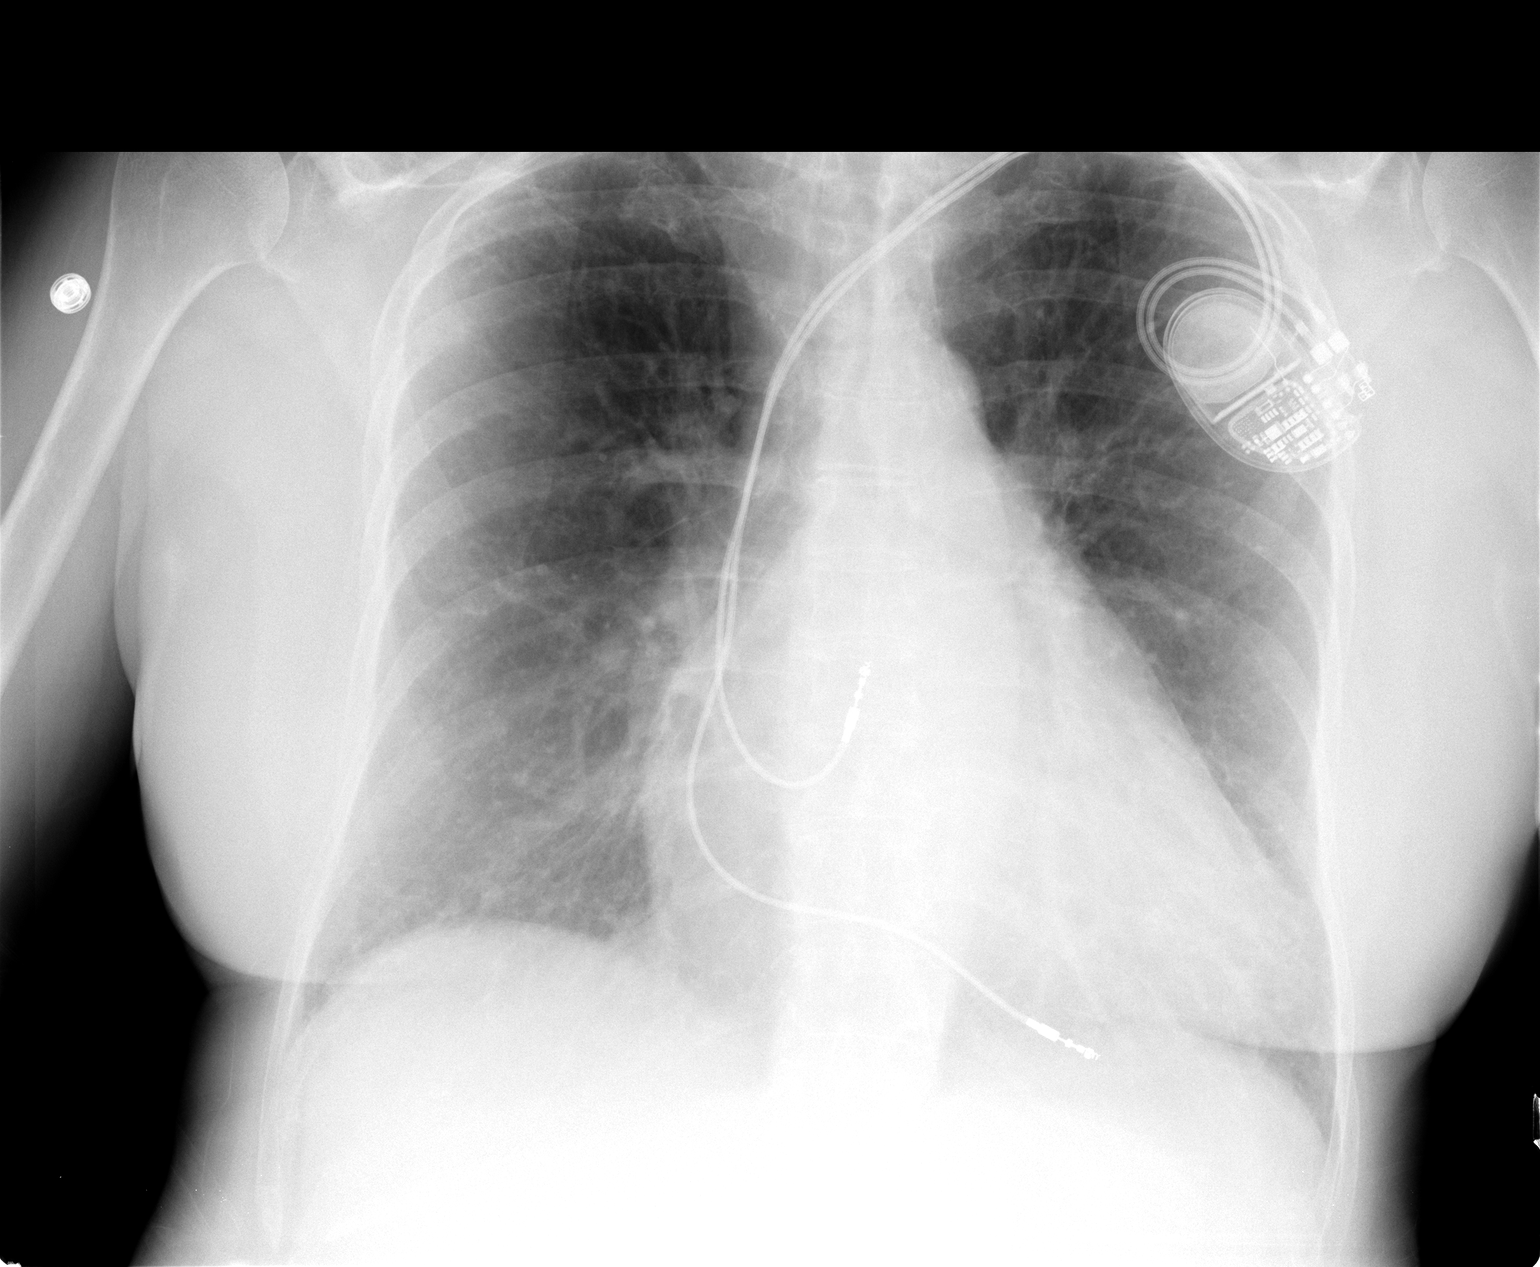

[2 of 2 positions shown; findings below may reference images not displayed]

FINDINGS: Cardiomegaly stable.  Both lungs remain clear.  No
evidence of pleural effusion.  Dual lead transvenous pacemaker
remains in appropriate position.
IMPRESSION: Stable cardiomegaly.  No active lung disease.

## 2012-01-02 ENCOUNTER — Ambulatory Visit (INDEPENDENT_AMBULATORY_CARE_PROVIDER_SITE_OTHER): Payer: Medicare Other | Admitting: *Deleted

## 2012-01-02 ENCOUNTER — Encounter: Payer: Self-pay | Admitting: Internal Medicine

## 2012-01-02 DIAGNOSIS — Z950 Presence of cardiac pacemaker: Secondary | ICD-10-CM

## 2012-01-02 DIAGNOSIS — I4891 Unspecified atrial fibrillation: Secondary | ICD-10-CM

## 2012-01-02 IMAGING — CR DG CHEST 1V PORT
1 series · 1 of 1 positions shown · non-contrast
Comparison: 06/30/2010

CLINICAL DATA: Chest pain, shortness of breath.

PORTABLE CHEST - 1 VIEW

[view not recorded]
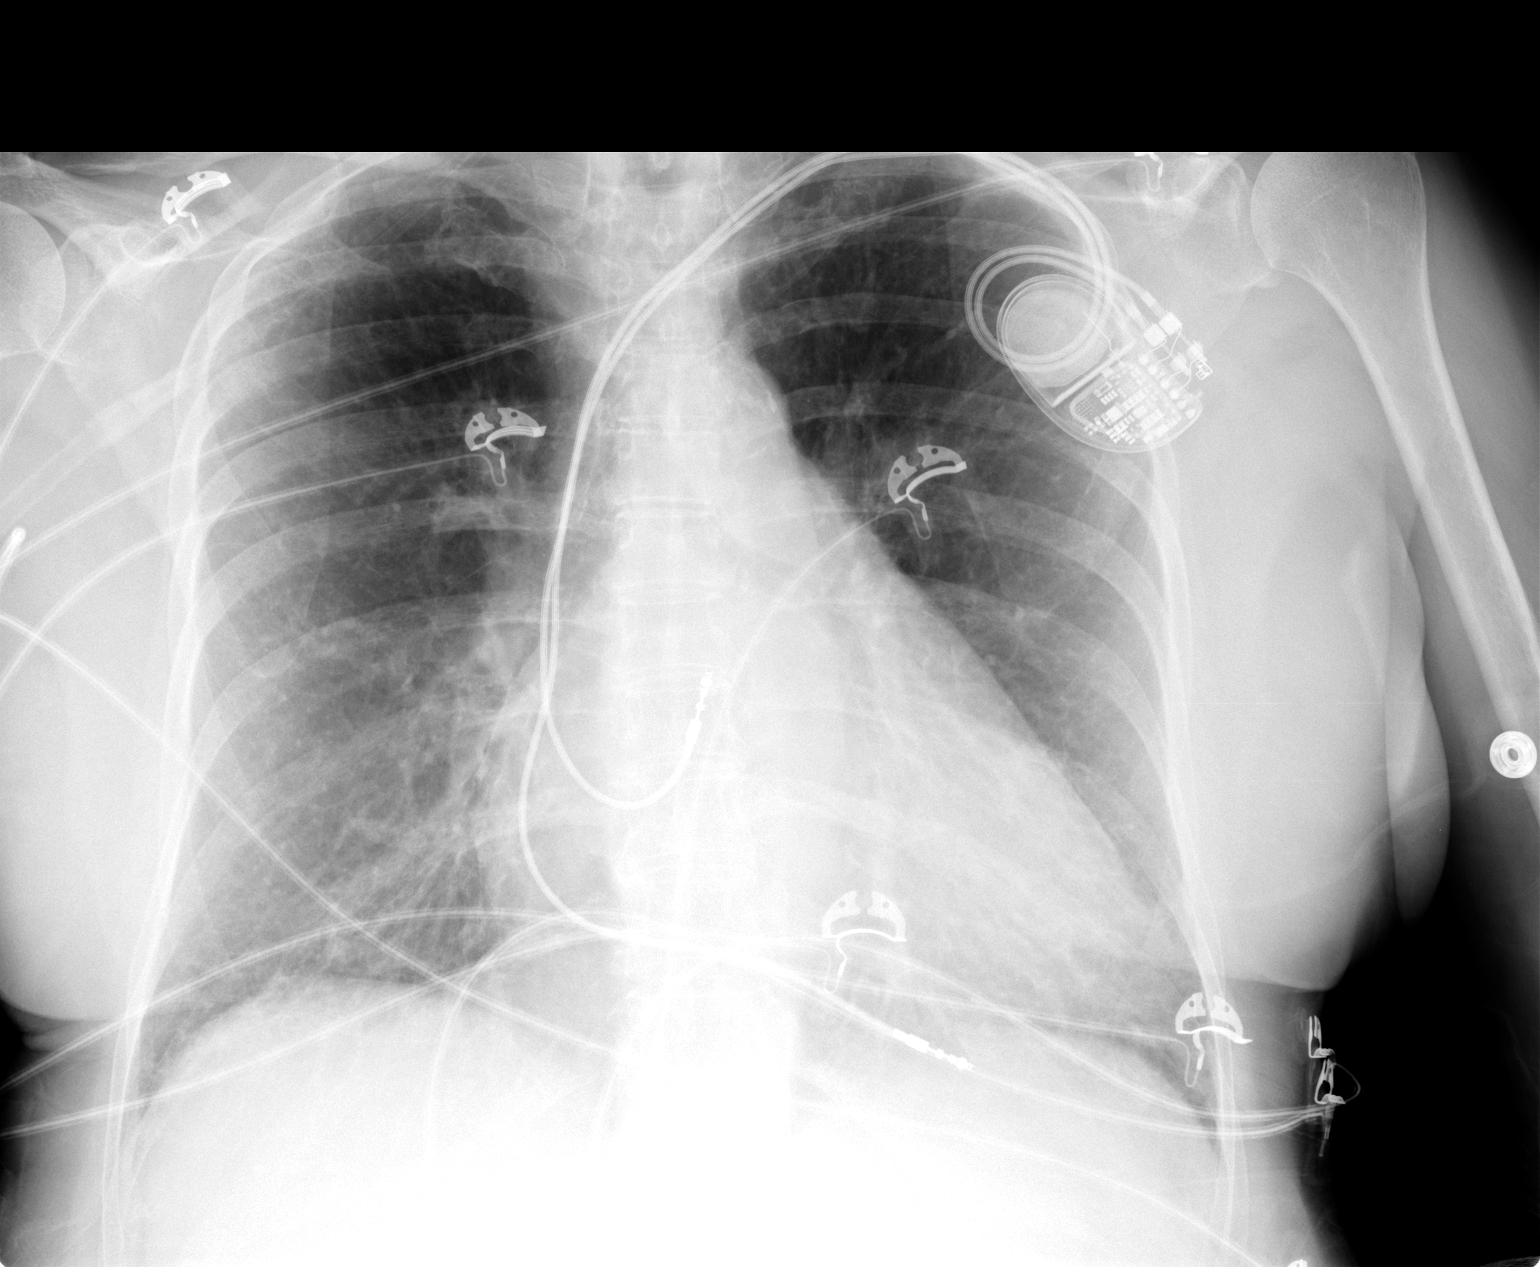

[1 of 1 positions shown; findings below may reference images not displayed]

FINDINGS: There is cardiomegaly.  Left-sided pacer remains in
place, unchanged.  Scarring noted in the lung bases.  No acute
opacities, effusions or edema.  No acute bony abnormality.
IMPRESSION: Cardiomegaly, bibasilar scarring.  No change.

## 2012-01-02 IMAGING — CT CT ANGIO CHEST
2 of 6 series · 19 of 36 positions shown · IV contrast (APPLIED)
Comparison: None

CLINICAL DATA: Chest pain, shortness of breath, nausea.

CT ANGIOGRAPHY CHEST WITH CONTRAST
TECHNIQUE: Multidetector CT imaging of the chest was performed
using the standard protocol during bolus administration of
intravenous contrast.  Multiplanar CT image reconstructions
including MIPs were obtained to evaluate the vascular anatomy.
Contrast:  100 ml Omnipaque 300 IV.

[Series 8: pulm embolism 1.0 b25f thins · axial · 0.59mm/px · z∈[-90,+168]mm · 18 of 289 slices shown]
[im 15/289  lung]
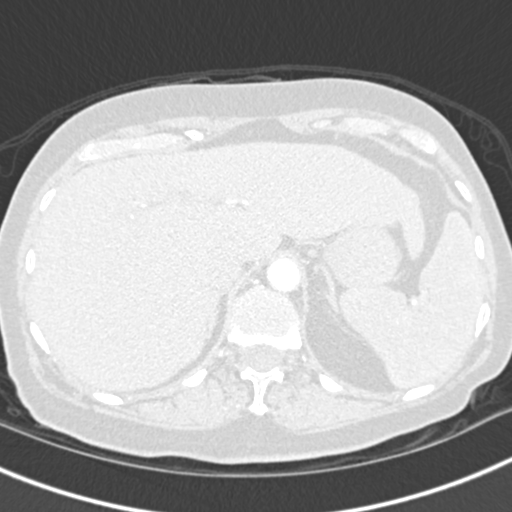
[im 29/289  mediastinal]
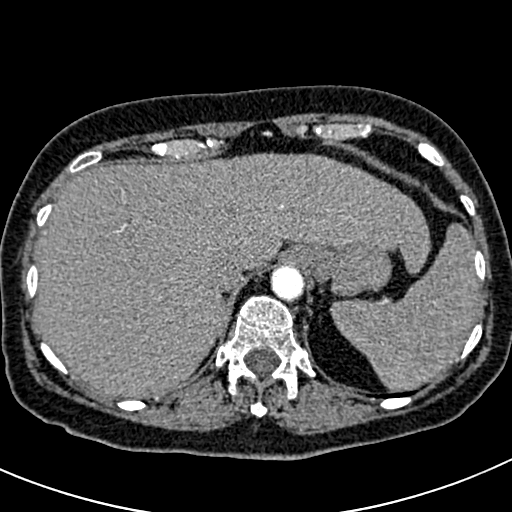
[im 44/289  lung]
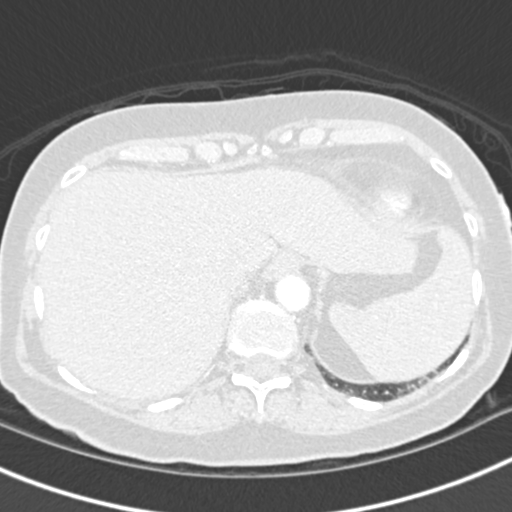
[im 58/289  mediastinal]
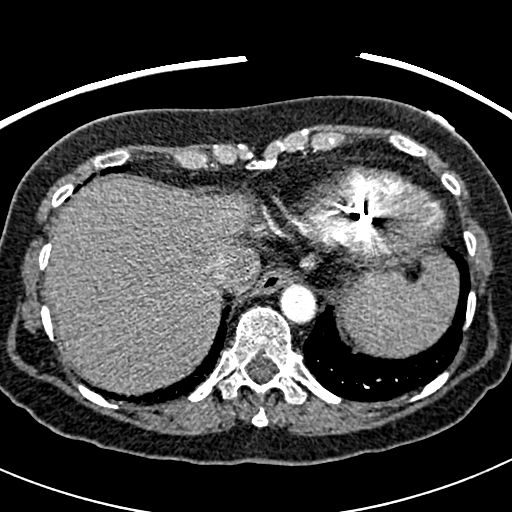
[im 73/289  lung]
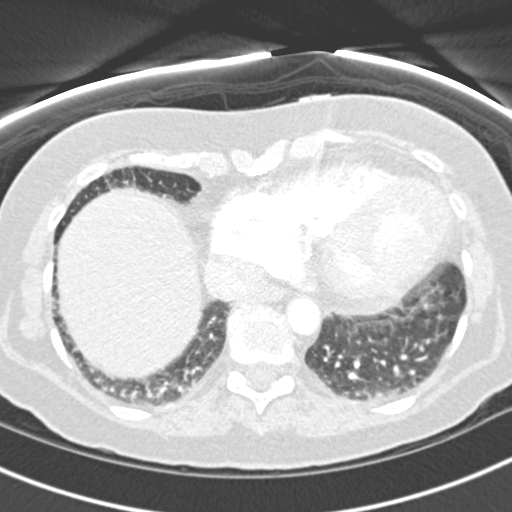
[im 87/289  mediastinal]
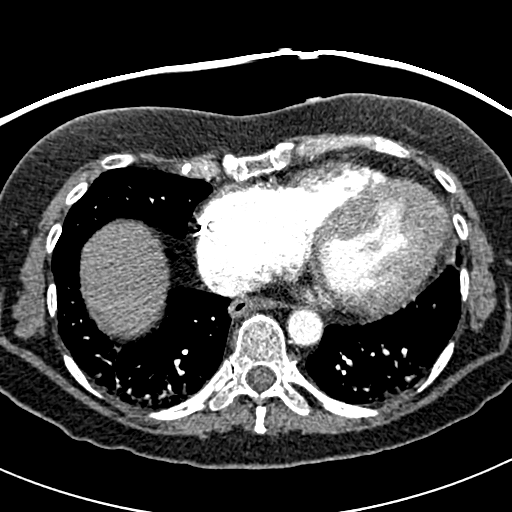
[im 101/289  lung]
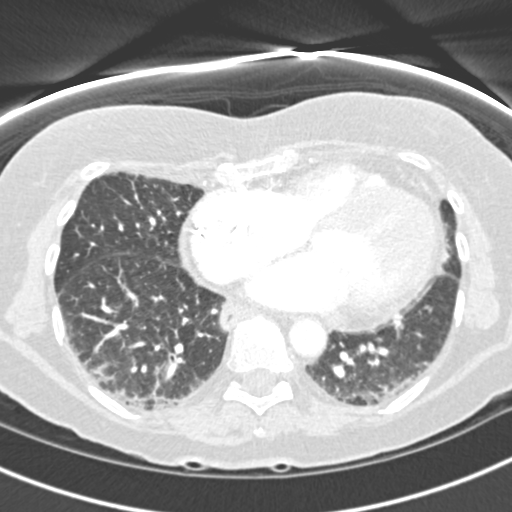
[im 116/289  mediastinal]
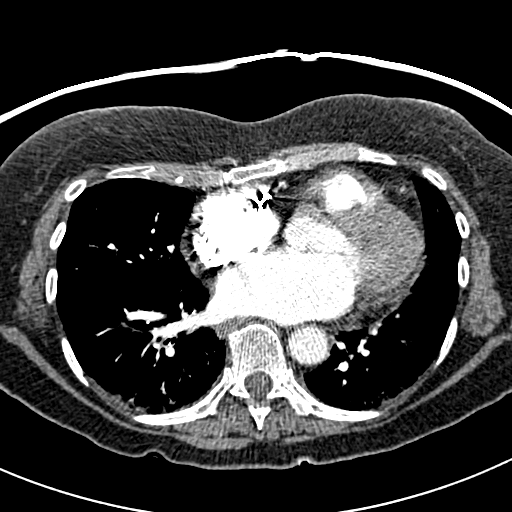
[im 130/289  lung]
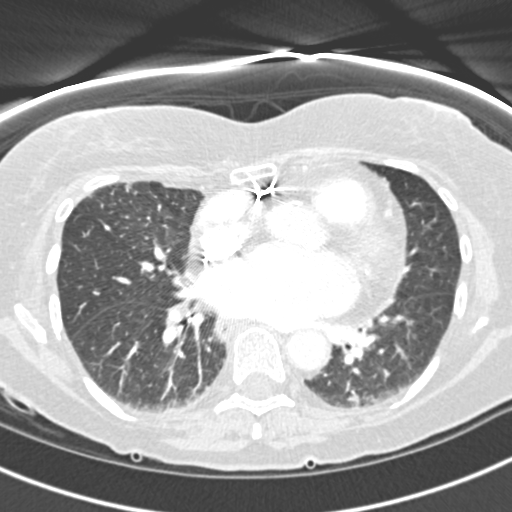
[im 159/289  mediastinal]
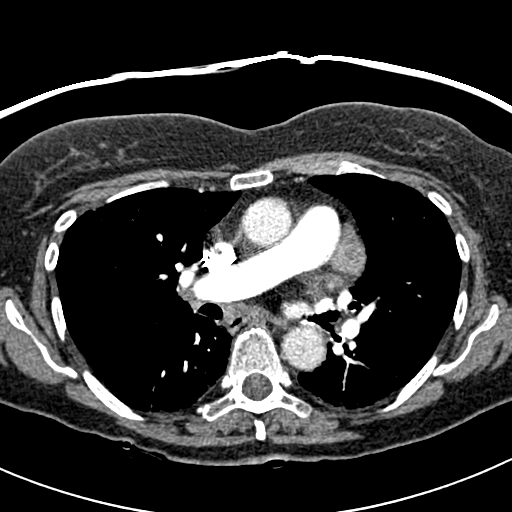
[im 173/289  lung]
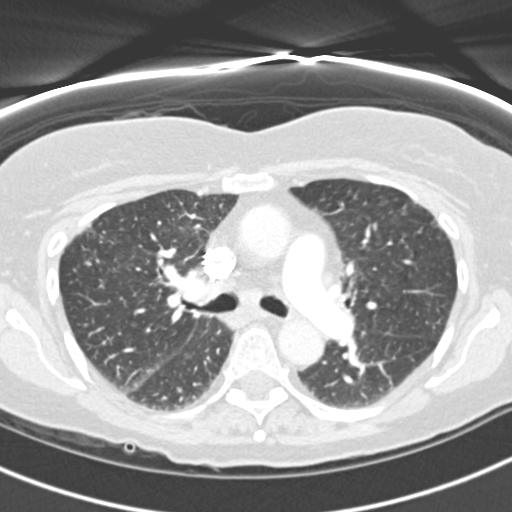
[im 188/289  mediastinal]
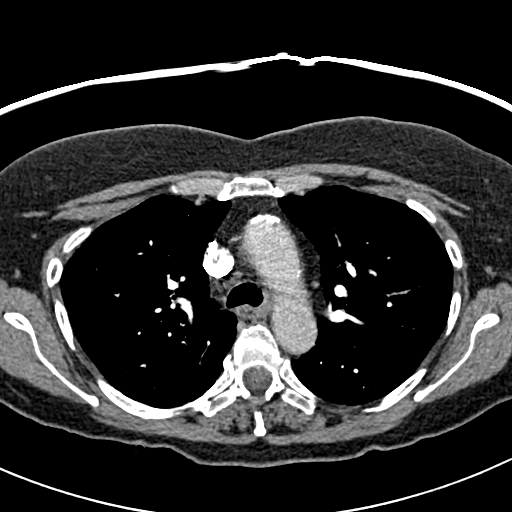
[im 202/289  lung]
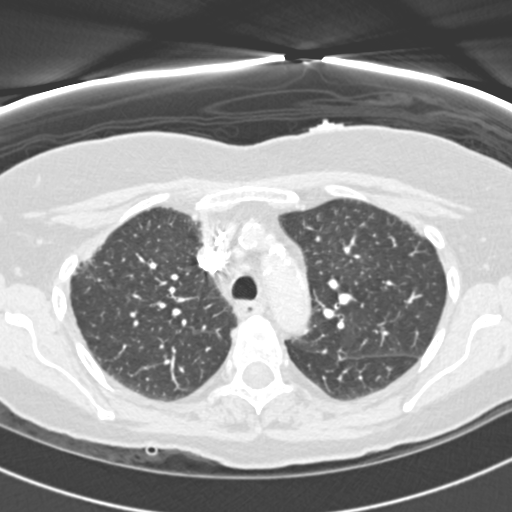
[im 217/289  mediastinal]
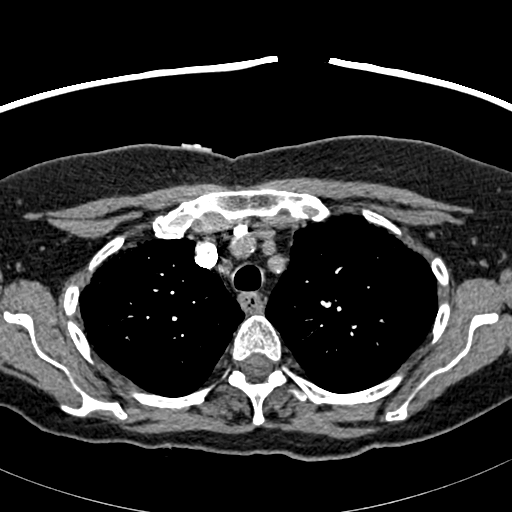
[im 231/289  lung]
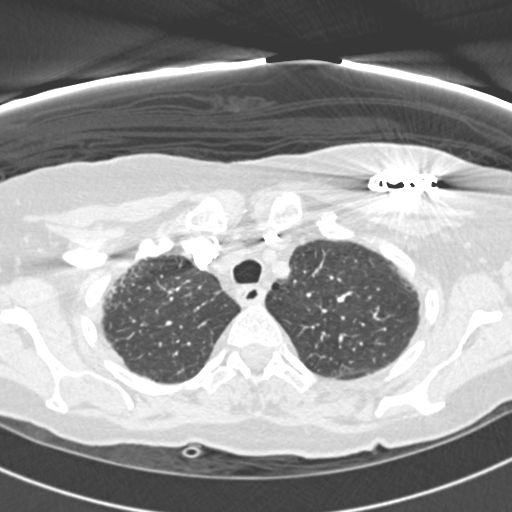
[im 245/289  mediastinal]
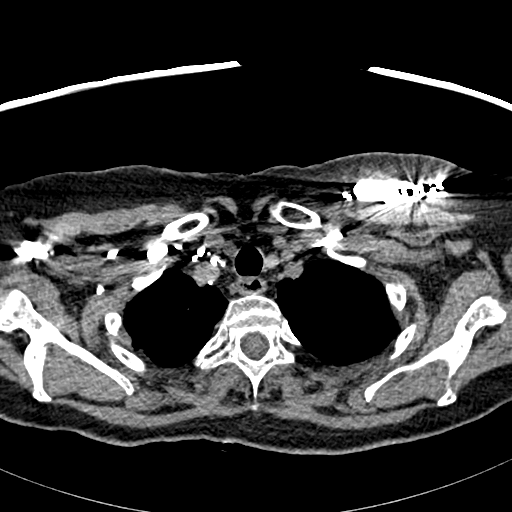
[im 260/289  lung]
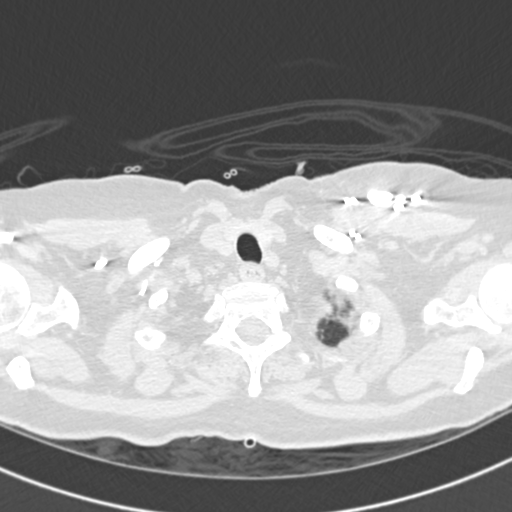
[im 274/289  mediastinal]
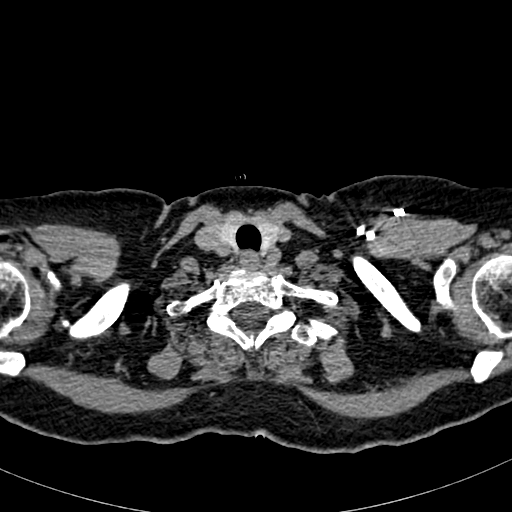

[Series 605: <mpr thick cor · coronal · 0.59mm/px · 1 of 87 slices shown]
[im 44/87  mediastinal]
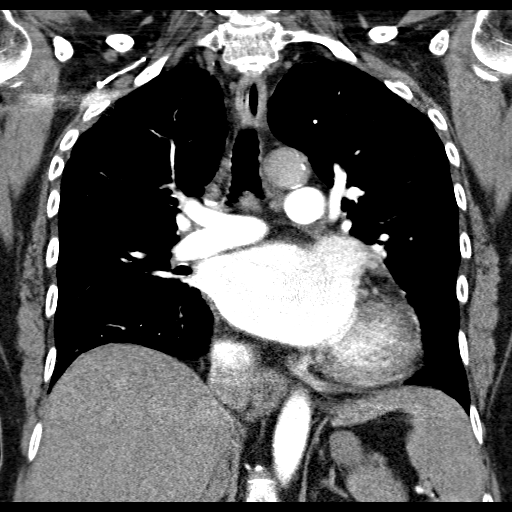

[19 of 36 positions shown; findings below may reference images not displayed]

FINDINGS: There is cardiomegaly.  No evidence of edema.  There are
peripheral ground-glass and linear densities in the lung bases and
right upper lobe.  This is most compatible with scarring, possibly
early fibrosis.  No pleural effusions.  Aorta is normal caliber.

Coronary artery calcifications are present.  Left-sided pacer
remains in place, unchanged.  Scattered small mediastinal lymph
nodes, none pathologically enlarged.  No hilar or axillary
adenopathy. Visualized thyroid and chest wall soft tissues
unremarkable. Imaging into the upper abdomen shows no acute
findings.

No filling defects in the pulmonary arteries to suggest pulmonary
emboli.

Review of the MIP images confirms the above findings.
IMPRESSION: No evidence of pulmonary embolus.

Cardiomegaly, coronary artery disease.

Areas of scarring in the right upper lobe and lung bases.

## 2012-01-03 LAB — REMOTE PACEMAKER DEVICE
BMOD-0003RV: 30
BMOD-0004RV: 5
RV LEAD IMPEDENCE PM: 376 Ohm
VENTRICULAR PACING PM: 66.32

## 2012-01-06 ENCOUNTER — Emergency Department (HOSPITAL_COMMUNITY): Payer: Medicare Other

## 2012-01-06 ENCOUNTER — Encounter (HOSPITAL_COMMUNITY): Payer: Self-pay | Admitting: Emergency Medicine

## 2012-01-06 ENCOUNTER — Emergency Department (HOSPITAL_COMMUNITY)
Admission: EM | Admit: 2012-01-06 | Discharge: 2012-01-06 | Disposition: A | Discharge status: Home / Self Care | Payer: Medicare Other | Attending: Emergency Medicine | Admitting: Emergency Medicine

## 2012-01-06 DIAGNOSIS — J4489 Other specified chronic obstructive pulmonary disease: Secondary | ICD-10-CM | POA: Insufficient documentation

## 2012-01-06 DIAGNOSIS — W06XXXA Fall from bed, initial encounter: Secondary | ICD-10-CM | POA: Insufficient documentation

## 2012-01-06 DIAGNOSIS — Z950 Presence of cardiac pacemaker: Secondary | ICD-10-CM | POA: Insufficient documentation

## 2012-01-06 DIAGNOSIS — M1990 Unspecified osteoarthritis, unspecified site: Secondary | ICD-10-CM | POA: Insufficient documentation

## 2012-01-06 DIAGNOSIS — I10 Essential (primary) hypertension: Secondary | ICD-10-CM | POA: Insufficient documentation

## 2012-01-06 DIAGNOSIS — M545 Low back pain, unspecified: Secondary | ICD-10-CM | POA: Insufficient documentation

## 2012-01-06 DIAGNOSIS — R51 Headache: Secondary | ICD-10-CM | POA: Insufficient documentation

## 2012-01-06 DIAGNOSIS — E119 Type 2 diabetes mellitus without complications: Secondary | ICD-10-CM | POA: Insufficient documentation

## 2012-01-06 DIAGNOSIS — E7800 Pure hypercholesterolemia, unspecified: Secondary | ICD-10-CM | POA: Insufficient documentation

## 2012-01-06 DIAGNOSIS — Z9889 Other specified postprocedural states: Secondary | ICD-10-CM | POA: Insufficient documentation

## 2012-01-06 DIAGNOSIS — J449 Chronic obstructive pulmonary disease, unspecified: Secondary | ICD-10-CM | POA: Insufficient documentation

## 2012-01-06 DIAGNOSIS — R29818 Other symptoms and signs involving the nervous system: Secondary | ICD-10-CM | POA: Insufficient documentation

## 2012-01-06 DIAGNOSIS — R220 Localized swelling, mass and lump, head: Secondary | ICD-10-CM | POA: Insufficient documentation

## 2012-01-06 DIAGNOSIS — Z79899 Other long term (current) drug therapy: Secondary | ICD-10-CM | POA: Insufficient documentation

## 2012-01-06 DIAGNOSIS — I4891 Unspecified atrial fibrillation: Secondary | ICD-10-CM | POA: Insufficient documentation

## 2012-01-06 DIAGNOSIS — W19XXXA Unspecified fall, initial encounter: Secondary | ICD-10-CM

## 2012-01-06 MED ORDER — MORPHINE SULFATE 4 MG/ML IJ SOLN
4.0000 mg | Freq: Once | INTRAMUSCULAR | Status: AC
  Administered 2012-01-06: 4 mg via INTRAVENOUS
  Filled 2012-01-06: qty 1

## 2012-01-06 NOTE — ED Notes (Signed)
Waiting for ride to dc.  Pt family called and states will be an hour.  From report was told to keep pt back here until ride gets here as md orders.

## 2012-01-06 NOTE — ED Provider Notes (Signed)
History     CSN: [id number]  Arrival date & time 01/06/12  1478   First MD Initiated Contact with Patient 01/06/12 [phone]      Chief Complaint  Patient presents with  . Fall    (Consider location/radiation/quality/duration/timing/severity/associated sxs/prior treatment) Patient is a 76 y.o. female presenting with fall. The history is provided by the patient (States that she fell out of the bed today the patient awoke up on the floor. 10.). No language interpreter was used.  Fall The accident occurred 1 to 2 hours ago. The fall occurred while walking. She fell from a height of 1 to 2 ft. She landed on carpet. The point of impact was the head. The pain is present in the head. The pain is at a severity of 4/10. The pain is moderate. She was ambulatory at the scene. There was no entrapment after the fall. There was no drug use involved in the accident. There was no alcohol use involved in the accident. Associated symptoms include headaches. Pertinent negatives include no abdominal pain, no bowel incontinence and no hematuria. The symptoms are aggravated by activity. Treatment on scene includes a c-collar and a backboard. She has tried nothing for the symptoms.    Past Medical History  Diagnosis Date  . Hypertrophic cardiomyopathy   . Hypertension   . Osteoarthritis   . Situational mixed anxiety and depressive disorder   . Hypercholesterolemia   . Atrial fibrillation     Chronic; No longer on coumadin  . Pacemaker   . Falls frequently     coumadin stopped  . Pulmonary HTN     has had prior right heart cath in 2010; was felt that most likely due to elevated left sided pressures and would not benefit from vasodilator therapy  . Diabetes mellitus   . Pacemaker   . Oxygen dependent   . COPD (chronic obstructive pulmonary disease)     Past Surgical History  Procedure Date  . Knee arthroplasty 2011    right knee  . Tonsillectomy   . Appendectomy   . Cardiac catheterization   .  Insert / replace / remove pacemaker   . Other surgical history     hysterectomy    Family History  Problem Relation Age of Onset  . Other Father   . Cancer Mother     History  Substance Use Topics  . Smoking status: Former Smoker    Types: Cigarettes    Quit date: 11/15/2005  . Smokeless tobacco: Never Used  . Alcohol Use: No    OB History    Grav Para Term Preterm Abortions TAB SAB Ect Mult Living                  Review of Systems  Constitutional: Negative for fatigue.  HENT: Negative for congestion, sinus pressure and ear discharge.   Eyes: Negative for discharge.  Respiratory: Negative for cough.   Cardiovascular: Negative for chest pain.  Gastrointestinal: Negative for abdominal pain, diarrhea and bowel incontinence.  Genitourinary: Negative for frequency and hematuria.  Musculoskeletal: Positive for back pain.  Skin: Negative for rash.  Neurological: Positive for headaches. Negative for seizures.  Hematological: Negative.   Psychiatric/Behavioral: Negative for hallucinations.    Allergies  Calan; Crestor; Lexapro; Lipitor; Lopressor; Norvasc; and Penicillins  Home Medications   Current Outpatient Rx  Name Route Sig Dispense Refill  . VITAMIN C 500 MG PO TABS Oral Take 500 mg by mouth daily.      . ASPIRIN  81 MG PO TABS Oral Take 81 mg by mouth daily.      [name] BISOPROLOL FUMARATE 10 MG PO TABS  Take 10 mg. One daily 90 tablet 3  . CALCIUM CARBONATE 600 MG PO TABS Oral Take 600 mg by mouth daily.      [name] VITAMIN D 2000 UNITS PO CAPS Oral Take 1 capsule by mouth daily.      [name] DILTIAZEM HCL 120 MG PO TABS Oral Take 1 tablet (120 mg total) by mouth daily. 30 tablet 11  . FAMOTIDINE 20 MG PO TABS Oral Take 20 mg by mouth 2 (two) times daily.     . FUROSEMIDE 40 MG PO TABS Oral Take 60 mg by mouth daily.    [name] GABAPENTIN 300 MG PO CAPS Oral Take 300-600 mg by mouth 4 (four) times daily. Takes 1 tablet in the am, 2 at lunch, 2 dinner and 2 at bedtime    .  HYDROCODONE-ACETAMINOPHEN 10-325 MG PO TABS Oral Take 1 tablet by mouth every 6 (six) hours as needed. pain    . LORAZEPAM 0.5 MG PO TABS Oral Take 0.5 mg by mouth every 6 (six) hours as needed. For anxiety    . LOSARTAN POTASSIUM 100 MG PO TABS Oral Take 1 tablet (100 mg total) by mouth daily. 30 tablet 11  . MAGNESIUM OXIDE 400 MG PO TABS Oral Take 400 mg by mouth daily.     [name] METFORMIN HCL 500 MG PO TABS Oral Take 500 mg by mouth daily. Dr. Clovis Riley Rx     . POTASSIUM CHLORIDE CRYS ER 20 MEQ PO TBCR Oral Take 1 tablet (20 mEq total) by mouth daily. 90 tablet 3  . SERTRALINE HCL 100 MG PO TABS Oral Take 100 mg by mouth daily.    [name] SIMVASTATIN 10 MG PO TABS Oral Take 1 tablet (10 mg total) by mouth at bedtime. 90 tablet 3    BP 162/64  Pulse 73  SpO2 96%  Physical Exam  Constitutional: She is oriented to person, place, and time. She appears well-developed.  HENT:  Head: Normocephalic.       Swelling to back of head  Eyes: Conjunctivae and EOM are normal. No scleral icterus.  Neck: Neck supple. No thyromegaly present.  Cardiovascular: Normal rate and regular rhythm.  Exam reveals no gallop and no friction rub.   No murmur heard. Pulmonary/Chest: No stridor. She has no wheezes. She has no rales. She exhibits no tenderness.  Abdominal: She exhibits no distension. There is no tenderness. There is no rebound.  Musculoskeletal: Normal range of motion. She exhibits no edema.       tendernous to lumbar spine  Lymphadenopathy:    She has no cervical adenopathy.  Neurological: She is oriented to person, place, and time. She has normal reflexes. A cranial nerve deficit is present. Coordination normal.  Skin: No rash noted. No erythema.  Psychiatric: She has a normal mood and affect. Her behavior is normal.    ED Course  Procedures (including critical care time)  Labs Reviewed - No data to display Dg Lumbar Spine Complete  01/06/2012  *RADIOLOGY REPORT*  Clinical Data: Status post fall;  lower back pain.  LUMBAR SPINE - COMPLETE 4+ VIEW  Comparison: Lumbar spine radiographs performed 11/15/2011  Findings: There is no evidence of fracture or subluxation. Vertebral bodies demonstrate normal height and alignment.  There is minimal disc space narrowing at L5-S1.  Facet disease is noted at the lower lumbar spine.  The  visualized bowel gas pattern is unremarkable in appearance; air and stool are noted within the colon.  Mild sclerotic change is seen at the sacroiliac joints.  Calcification is noted along the abdominal aorta and its branches.  IMPRESSION:  1.  No evidence of fracture or subluxation along the lumbar spine. 2.  Calcification noted along the abdominal aorta and its branches.  Original Report Authenticated By: Tonia Ghent, M.D.   Dg Pelvis 1-2 Views  01/06/2012  *RADIOLOGY REPORT*  Clinical Data: Status post fall; lower back pain.  PELVIS - 1-2 VIEW  Comparison: Abdominal radiograph performed 01/18/2011  Findings: There is no evidence of fracture or dislocation.  Both femoral heads are seated normally within their respective acetabula.  No significant degenerative change is appreciated. Sclerotic change is noted along the sacroiliac joints.  The visualized bowel gas pattern is grossly unremarkable in appearance.  Clips are noted at the right inguinal region. Scattered vascular calcifications are seen.  IMPRESSION:  1.  No evidence of fracture or dislocation. 2.  Scattered vascular calcifications seen.  Original Report Authenticated By: Tonia Ghent, M.D.   Dg Sacrum/coccyx  01/06/2012  *RADIOLOGY REPORT*  Clinical Data: Status post fall; lower back pain.  SACRUM AND COCCYX - 2+ VIEW  Comparison: Lumbar spine radiographs performed 11/15/2011  Findings: The sacrum and coccyx appear intact.  There is no evidence of fracture.  The sacroiliac joints demonstrate mild sclerotic change.  There is narrowing at the intervertebral disc space at L5-S1, with facet disease noted along the lower  lumbar spine.  The visualized bowel gas pattern is grossly unremarkable. Scattered vascular calcifications are seen.  Clips are noted at the right inguinal region.  IMPRESSION:  1.  No evidence of fracture. 2.  Scattered vascular calcifications seen. 3.  Mild degenerative change at the lower lumbar spine.  Original Report Authenticated By: Tonia Ghent, M.D.   Ct Head Wo Contrast  01/06/2012  *RADIOLOGY REPORT*  Clinical Data:  Status post fall out of bed; diffuse pain.  Concern for head or cervical spine injury.  CT HEAD WITHOUT CONTRAST AND CT CERVICAL SPINE WITHOUT CONTRAST  Technique:  Multidetector CT imaging of the head and cervical spine was performed following the standard protocol without intravenous contrast.  Multiplanar CT image reconstructions of the cervical spine were also generated.  Comparison: CT of the head performed 12/21/2011, and CT of the head and cervical spine performed 01/02/2011  CT HEAD  Findings: There is no evidence of acute infarction, mass lesion, or intra- or extra-axial hemorrhage on CT.  Prominence of the ventricles and sulci reflects mild cortical volume loss.  Cerebellar atrophy is noted.  Scattered periventricular and subcortical white matter change likely reflects small vessel ischemic angiopathy.  A chronic lacunar infarct is noted within the right putamen.  The brainstem and fourth ventricle are within normal limits.  The cerebral hemispheres demonstrate grossly normal gray-white differentiation.  No mass effect or midline shift is seen.  There is no evidence of fracture; visualized osseous structures are unremarkable in appearance.  The orbits are within normal limits. The paranasal sinuses and mastoid air cells are well-aerated.  No significant soft tissue abnormalities are seen.  IMPRESSION:  1.  No evidence of traumatic intracranial injury or fracture. 2.  Mild cortical volume loss and scattered small vessel ischemic microangiopathy. 3.  Chronic lacunar infarct within  the right putamen.  CT CERVICAL SPINE  Findings: There is no evidence of fracture or subluxation. Vertebral bodies demonstrate normal height and alignment.  There  is mild multilevel disc space narrowing noted along the lower cervical spine, with anterior and posterior disc osteophyte complexes.  Mild degenerative change is noted about the dens.  Prevertebral soft tissues are within normal limits.  The thyroid gland is unremarkable in appearance. The lung apices are not well assessed due to motion artifact, though there is mild apparent interstitial prominence and scattered atelectasis bilaterally.  No significant soft tissue abnormalities are seen. Mild calcification is noted at the carotid bifurcations bilaterally.  IMPRESSION:  1.  No evidence of fracture or subluxation along the cervical spine. 2.  Mild degenerative change noted along the lower cervical spine. 3.  Mild apparent interstitial prominence and scattered atelectasis noted at the lung apices. 4.  Mild calcification noted at the carotid bifurcations bilaterally.  Original Report Authenticated By: Tonia Ghent, M.D.   Ct Cervical Spine Wo Contrast  01/06/2012  *RADIOLOGY REPORT*  Clinical Data:  Status post fall out of bed; diffuse pain.  Concern for head or cervical spine injury.  CT HEAD WITHOUT CONTRAST AND CT CERVICAL SPINE WITHOUT CONTRAST  Technique:  Multidetector CT imaging of the head and cervical spine was performed following the standard protocol without intravenous contrast.  Multiplanar CT image reconstructions of the cervical spine were also generated.  Comparison: CT of the head performed 12/21/2011, and CT of the head and cervical spine performed 01/02/2011  CT HEAD  Findings: There is no evidence of acute infarction, mass lesion, or intra- or extra-axial hemorrhage on CT.  Prominence of the ventricles and sulci reflects mild cortical volume loss.  Cerebellar atrophy is noted.  Scattered periventricular and subcortical white matter  change likely reflects small vessel ischemic angiopathy.  A chronic lacunar infarct is noted within the right putamen.  The brainstem and fourth ventricle are within normal limits.  The cerebral hemispheres demonstrate grossly normal gray-white differentiation.  No mass effect or midline shift is seen.  There is no evidence of fracture; visualized osseous structures are unremarkable in appearance.  The orbits are within normal limits. The paranasal sinuses and mastoid air cells are well-aerated.  No significant soft tissue abnormalities are seen.  IMPRESSION:  1.  No evidence of traumatic intracranial injury or fracture. 2.  Mild cortical volume loss and scattered small vessel ischemic microangiopathy. 3.  Chronic lacunar infarct within the right putamen.  CT CERVICAL SPINE  Findings: There is no evidence of fracture or subluxation. Vertebral bodies demonstrate normal height and alignment.  There is mild multilevel disc space narrowing noted along the lower cervical spine, with anterior and posterior disc osteophyte complexes.  Mild degenerative change is noted about the dens.  Prevertebral soft tissues are within normal limits.  The thyroid gland is unremarkable in appearance. The lung apices are not well assessed due to motion artifact, though there is mild apparent interstitial prominence and scattered atelectasis bilaterally.  No significant soft tissue abnormalities are seen. Mild calcification is noted at the carotid bifurcations bilaterally.  IMPRESSION:  1.  No evidence of fracture or subluxation along the cervical spine. 2.  Mild degenerative change noted along the lower cervical spine. 3.  Mild apparent interstitial prominence and scattered atelectasis noted at the lung apices. 4.  Mild calcification noted at the carotid bifurcations bilaterally.  Original Report Authenticated By: Tonia Ghent, M.D.     1. Fall     Patient was able to stand without any dizziness she was able to ambulate without any  problems.  MDM  Fall.  Benny Lennert, MD 01/06/12 [phone]

## 2012-01-06 NOTE — ED Notes (Signed)
PER EMS- Pt states she got up from bed to use restroom when she fell. Pt states she hit her head on the night stand. No LOC, pt went back to bed but aroused later on in pain. Vitals Stable. Pt alertx4, NAD.

## 2012-01-06 NOTE — ED Notes (Signed)
Patient transported to CT 

## 2012-01-06 NOTE — Discharge Instructions (Signed)
Follow up with your md if any problems °

## 2012-01-08 ENCOUNTER — Encounter: Payer: Self-pay | Admitting: *Deleted

## 2012-01-09 NOTE — Progress Notes (Signed)
Remote pacer check  

## 2012-02-03 ENCOUNTER — Ambulatory Visit (INDEPENDENT_AMBULATORY_CARE_PROVIDER_SITE_OTHER): Payer: Medicare Other | Admitting: Cardiology

## 2012-02-03 ENCOUNTER — Encounter: Payer: Self-pay | Admitting: Cardiology

## 2012-02-03 ENCOUNTER — Telehealth: Payer: Self-pay | Admitting: Cardiology

## 2012-02-03 DIAGNOSIS — R079 Chest pain, unspecified: Secondary | ICD-10-CM

## 2012-02-03 DIAGNOSIS — I4891 Unspecified atrial fibrillation: Secondary | ICD-10-CM

## 2012-02-03 DIAGNOSIS — F4323 Adjustment disorder with mixed anxiety and depressed mood: Secondary | ICD-10-CM

## 2012-02-03 NOTE — Telephone Encounter (Signed)
New Msg: Pt calling wanting to speak with nurse chest pain and heart pounding with excerption. Pt c/o chest pain at present number. Pt c/o sob all the time (pt stated he uses oxygen). Pt also c/o stabbing headache pain. Pt stated she has been having sob and chest pain since the weekend; headaches just started today.   Pt call transferred to triage.   Alt # Cell number

## 2012-02-03 NOTE — Patient Instructions (Addendum)
Increase your Diltiazem to 120 mg twice a day  Keep you April appointment (4/30 )

## 2012-02-03 NOTE — Assessment & Plan Note (Signed)
Patient has been having chest pain at rest and with exertion.  We will try increasing her diltiazem up to 120 mg twice a day.  We have been avoiding nitroglycerin products because of her hypertrophic cardiomyopathy

## 2012-02-03 NOTE — Progress Notes (Signed)
[name] Date of Birth:  [birth year] [facility] [place] [address] [place], [place]  [address] [phone]         Fax   [phone]  History of Present Illness: This pleasant 76 year old woman is seen for a work in office visit.  She has been experiencing some increased chest discomfort with radiation up into the neck she has also been short of breath with exertion.  She has a past history of atypical chest pain, hypertrophic cardiomyopathy, chronic atrial fibrillation, depression, and hyperlipidemia.  She also has COPD.  She has a functioning ventricular pacemaker.  Her last echocardiogram was 07/10/11 showed a vigorous left ventricular systolic function with near obliteration of the left ventricular cavity during most of systole.  The patient had a nuclear stress test on 08/15/11 which did not show any reversible ischemia.  The patient has chronic atrial fibrillation.  She is not on warfarin because of fall risk.  Current Outpatient Prescriptions  Medication Sig Dispense Refill  . Ascorbic Acid (VITAMIN C) 500 MG tablet Take 500 mg by mouth daily.        [name] aspirin 81 MG tablet Take 81 mg by mouth daily.        . bisoprolol (ZEBETA) 10 MG tablet Take 10 mg. One daily  90 tablet  3  . calcium carbonate (OS-CAL) 600 MG TABS Take 600 mg by mouth daily.        . Cholecalciferol (VITAMIN D) 2000 UNITS CAPS Take 1 capsule by mouth daily.        [name] diltiazem (CARDIZEM) 120 MG tablet Take 120 mg by mouth 2 (two) times daily.      . famotidine (PEPCID) 20 MG tablet Take 20 mg by mouth 2 (two) times daily. As needed      . furosemide (LASIX) 40 MG tablet Take 60 mg by mouth daily.      [name] gabapentin (NEURONTIN) 300 MG capsule Take 300-600 mg by mouth 4 (four) times daily. Takes 1 tablet in the am, 2 at lunch, 2 dinner and 2 at bedtime      . HYDROcodone-acetaminophen (NORCO) 10-325 MG per tablet Take 1 tablet by mouth every 6 (six) hours as needed. pain      . LORazepam (ATIVAN) 0.5 MG  tablet Take 0.5 mg by mouth every 6 (six) hours as needed. For anxiety      . losartan (COZAAR) 100 MG tablet Take 1 tablet (100 mg total) by mouth daily.  30 tablet  11  . magnesium oxide (MAG-OX) 400 MG tablet Take 400 mg by mouth daily.       . metFORMIN (GLUCOPHAGE) 500 MG tablet Take 500 mg by mouth daily. Dr. Clovis Riley Rx       . mirtazapine (REMERON) 30 MG tablet Take 30 mg by mouth at bedtime.      . potassium chloride SA (K-DUR,KLOR-CON) 20 MEQ tablet Take 20 mEq by mouth daily. Taking the days she takes lasix      . simvastatin (ZOCOR) 10 MG tablet Take 1 tablet (10 mg total) by mouth at bedtime.  90 tablet  3  . DISCONTD: diltiazem (CARDIZEM) 120 MG tablet Take 1 tablet (120 mg total) by mouth daily.  30 tablet  11  . DISCONTD: potassium chloride SA (K-DUR,KLOR-CON) 20 MEQ tablet Take 1 tablet (20 mEq total) by mouth daily.  90 tablet  3    Allergies  Allergen Reactions  . Calan (Verapamil Hcl) Other (See Comments)  Patient states it makes her "out of her mind"  . Crestor (Rosuvastatin Calcium)     Muscle pain  . Lexapro Other (See Comments)    Unknown   . Lipitor (Atorvastatin Calcium)     myalgias  . Lopressor (Metoprolol Tartrate) Other (See Comments)    Unknown   . Norvasc (Amlodipine Besylate)     fatigue  . Penicillins     REACTION: rash    Patient Active Problem List  Diagnoses  . HYPERLIPIDEMIA  . DEPRESSIVE DISORDER NOT ELSEWHERE CLASSIFIED  . HYPERTENSION  . ALLERGIC RHINITIS  . CHRONIC OBSTRUCTIVE PULMONARY DISEASE, MODERATE  . SHORTNESS OF BREATH  . COUGH  . Atrial fibrillation  . Pacemaker  . Hypertrophic cardiomyopathy  . Osteoarthritis  . Situational mixed anxiety and depressive disorder  . Hypercholesterolemia  . Mitral regurgitation  . Fall  . Diabetes mellitus  . Chest pain at rest    History  Smoking status  . Former Smoker  . Types: Cigarettes  . Quit date: 11/15/2005  Smokeless tobacco  . Never Used    History  Alcohol Use  No    Family History  Problem Relation Age of Onset  . Other Father   . Cancer Mother     Review of Systems: Constitutional: no fever chills diaphoresis or fatigue or change in weight.  Head and neck: no hearing loss, no epistaxis, no photophobia or visual disturbance. Respiratory: No cough, shortness of breath or wheezing. Cardiovascular: No chest pain peripheral edema, palpitations. Gastrointestinal: No abdominal distention, no abdominal pain, no change in bowel habits hematochezia or melena. Genitourinary: No dysuria, no frequency, no urgency, no nocturia. Musculoskeletal:No arthralgias, no back pain, no gait disturbance or myalgias. Neurological: No dizziness, no headaches, no numbness, no seizures, no syncope, no weakness, no tremors. Hematologic: No lymphadenopathy, no easy bruising. Psychiatric: No confusion, no hallucinations, no sleep disturbance.    Physical Exam: Filed Vitals:   02/03/12 1647  BP: 118/70  Pulse: 65   the general appearance reveals a well-developed well-nourished woman in no distress.Pupils equal and reactive.   Extraocular Movements are full.  There is no scleral icterus.  The mouth and pharynx are normal.  The neck is supple.  The carotids reveal no bruits.  The jugular venous pressure is normal.  The thyroid is not enlarged.  There is no lymphadenopathy.  The chest is clear to percussion and auscultation. There are no rales or rhonchi. Expansion of the chest is symmetrical.  Heart reveals a soft systolic ejection murmur at the base.  No diastolic murmur.  No gallop or rub. The abdomen is soft and nontender. Bowel sounds are normal. The liver and spleen are not enlarged. There Are no abdominal masses. There are no bruits.  The pedal pulses are good.  There is no phlebitis or edema.  There is no cyanosis or clubbing. Strength is normal and symmetrical in all extremities.  There is no lateralizing weakness.  There are no sensory deficits.  EKG today  showed atrophic fibrillation and a paced ventricular rhythm at 66 per minute   Assessment / Plan: The patient is to continue same medication except we will increase her diltiazem to 120 mg twice a day. Recheck at regular visit in several months at which time she is scheduled for fasting lab work

## 2012-02-03 NOTE — Assessment & Plan Note (Signed)
The patient is in chronic atrial fibrillation.  She has not had any TIA symptoms.  She is on a daily baby aspirin.

## 2012-02-03 NOTE — Assessment & Plan Note (Signed)
The patient still has a lot of anxiety and depression symptoms.  She is still in grief over her husband's death.  She has been helped greatly by seeing psychiatry Dr. Donell Beers

## 2012-02-03 NOTE — Telephone Encounter (Signed)
Appt made to see Dr. Patty Sermons today.

## 2012-02-11 ENCOUNTER — Ambulatory Visit
Admission: RE | Admit: 2012-02-11 | Discharge: 2012-02-11 | Disposition: A | Discharge status: Home / Self Care | Payer: Medicare Other | Source: Ambulatory Visit | Attending: Physician Assistant | Admitting: Physician Assistant

## 2012-02-11 ENCOUNTER — Other Ambulatory Visit: Payer: Self-pay | Admitting: Physician Assistant

## 2012-02-11 DIAGNOSIS — R0602 Shortness of breath: Secondary | ICD-10-CM

## 2012-02-12 ENCOUNTER — Telehealth: Payer: Self-pay | Admitting: Cardiology

## 2012-02-12 NOTE — Telephone Encounter (Signed)
Advised patient to increase and to call back and let us know how she was doing

## 2012-02-12 NOTE — Telephone Encounter (Signed)
Taking lasix 60 mg daily since yesterday and weight down 2 pounds.  Sortness of breath is worse when she gets up and moves around.  States when she gets up can hardly breath and feels like she is going to pass out.  Will forward to  Dr. Patty Sermons for review

## 2012-02-12 NOTE — Telephone Encounter (Signed)
Yes, chest xray yesterday showed increase in vascular congestion. Increase lasix to 80 mg daily and observe response.

## 2012-02-12 NOTE — Telephone Encounter (Signed)
New problem:  Patient calling C/O sob, went to PCP on Monday & Tuesday told by PCP that she had  Fluid around heart.  Was told to increase her fluid pill. Patient stated she is still sob.

## 2012-02-18 ENCOUNTER — Emergency Department (HOSPITAL_COMMUNITY): Payer: Medicare Other

## 2012-02-18 ENCOUNTER — Other Ambulatory Visit: Payer: Self-pay

## 2012-02-18 ENCOUNTER — Encounter (HOSPITAL_COMMUNITY): Payer: Self-pay | Admitting: *Deleted

## 2012-02-18 ENCOUNTER — Inpatient Hospital Stay (HOSPITAL_COMMUNITY)
Admission: EM | Admit: 2012-02-18 | Discharge: 2012-02-19 | DRG: 069 | Disposition: A | Discharge status: Home / Self Care | Payer: Medicare Other | Attending: Internal Medicine | Admitting: Internal Medicine

## 2012-02-18 DIAGNOSIS — I639 Cerebral infarction, unspecified: Secondary | ICD-10-CM

## 2012-02-18 DIAGNOSIS — Z79899 Other long term (current) drug therapy: Secondary | ICD-10-CM

## 2012-02-18 DIAGNOSIS — I340 Nonrheumatic mitral (valve) insufficiency: Secondary | ICD-10-CM | POA: Insufficient documentation

## 2012-02-18 DIAGNOSIS — Z950 Presence of cardiac pacemaker: Secondary | ICD-10-CM

## 2012-02-18 DIAGNOSIS — Z87891 Personal history of nicotine dependence: Secondary | ICD-10-CM

## 2012-02-18 DIAGNOSIS — I079 Rheumatic tricuspid valve disease, unspecified: Secondary | ICD-10-CM | POA: Diagnosis present

## 2012-02-18 DIAGNOSIS — I4891 Unspecified atrial fibrillation: Secondary | ICD-10-CM | POA: Diagnosis present

## 2012-02-18 DIAGNOSIS — I422 Other hypertrophic cardiomyopathy: Secondary | ICD-10-CM | POA: Insufficient documentation

## 2012-02-18 DIAGNOSIS — Z880 Allergy status to penicillin: Secondary | ICD-10-CM

## 2012-02-18 DIAGNOSIS — G459 Transient cerebral ischemic attack, unspecified: Principal | ICD-10-CM | POA: Diagnosis present

## 2012-02-18 DIAGNOSIS — W19XXXA Unspecified fall, initial encounter: Secondary | ICD-10-CM | POA: Insufficient documentation

## 2012-02-18 DIAGNOSIS — Z888 Allergy status to other drugs, medicaments and biological substances status: Secondary | ICD-10-CM

## 2012-02-18 DIAGNOSIS — Z7982 Long term (current) use of aspirin: Secondary | ICD-10-CM

## 2012-02-18 DIAGNOSIS — I6529 Occlusion and stenosis of unspecified carotid artery: Secondary | ICD-10-CM | POA: Diagnosis present

## 2012-02-18 DIAGNOSIS — F329 Major depressive disorder, single episode, unspecified: Secondary | ICD-10-CM | POA: Diagnosis present

## 2012-02-18 DIAGNOSIS — F411 Generalized anxiety disorder: Secondary | ICD-10-CM | POA: Diagnosis present

## 2012-02-18 DIAGNOSIS — I495 Sick sinus syndrome: Secondary | ICD-10-CM | POA: Diagnosis present

## 2012-02-18 DIAGNOSIS — J4489 Other specified chronic obstructive pulmonary disease: Secondary | ICD-10-CM | POA: Insufficient documentation

## 2012-02-18 DIAGNOSIS — F3289 Other specified depressive episodes: Secondary | ICD-10-CM | POA: Diagnosis present

## 2012-02-18 DIAGNOSIS — E785 Hyperlipidemia, unspecified: Secondary | ICD-10-CM | POA: Diagnosis present

## 2012-02-18 DIAGNOSIS — J449 Chronic obstructive pulmonary disease, unspecified: Secondary | ICD-10-CM | POA: Insufficient documentation

## 2012-02-18 DIAGNOSIS — Z9181 History of falling: Secondary | ICD-10-CM

## 2012-02-18 DIAGNOSIS — R079 Chest pain, unspecified: Secondary | ICD-10-CM | POA: Insufficient documentation

## 2012-02-18 DIAGNOSIS — I059 Rheumatic mitral valve disease, unspecified: Secondary | ICD-10-CM | POA: Diagnosis present

## 2012-02-18 DIAGNOSIS — Z6827 Body mass index (BMI) 27.0-27.9, adult: Secondary | ICD-10-CM

## 2012-02-18 DIAGNOSIS — I10 Essential (primary) hypertension: Secondary | ICD-10-CM | POA: Insufficient documentation

## 2012-02-18 DIAGNOSIS — E119 Type 2 diabetes mellitus without complications: Secondary | ICD-10-CM | POA: Diagnosis present

## 2012-02-18 DIAGNOSIS — I672 Cerebral atherosclerosis: Secondary | ICD-10-CM | POA: Diagnosis present

## 2012-02-18 DIAGNOSIS — R05 Cough: Secondary | ICD-10-CM | POA: Insufficient documentation

## 2012-02-18 HISTORY — DX: Transient cerebral ischemic attack, unspecified: G45.9

## 2012-02-18 LAB — DIFFERENTIAL
Basophils Absolute: 0.1 10*3/uL (ref 0.0–0.1)
Lymphs Abs: 1.8 10*3/uL (ref 0.7–4.0)
Monocytes Relative: 5 % (ref 3–12)
Neutro Abs: 3.8 10*3/uL (ref 1.7–7.7)

## 2012-02-18 LAB — COMPREHENSIVE METABOLIC PANEL
ALT: 10 U/L (ref 0–35)
AST: 31 U/L (ref 0–37)
Albumin: 3.9 g/dL (ref 3.5–5.2)
Alkaline Phosphatase: 91 U/L (ref 39–117)
BUN: 17 mg/dL (ref 6–23)
CO2: 25 mEq/L (ref 19–32)
Calcium: 9.7 mg/dL (ref 8.4–10.5)
Chloride: 100 mEq/L (ref 96–112)
Creatinine, Ser: 0.91 mg/dL (ref 0.50–1.10)
GFR calc Af Amer: 71 mL/min — ABNORMAL LOW (ref >90)
GFR calc non Af Amer: 61 mL/min — ABNORMAL LOW (ref >90)
Glucose, Bld: 130 mg/dL — ABNORMAL HIGH (ref 70–99)
Potassium: 3.2 mEq/L — ABNORMAL LOW (ref 3.5–5.1)
Sodium: 138 mEq/L (ref 135–145)
Total Bilirubin: 1.1 mg/dL (ref 0.3–1.2)
Total Protein: 7.3 g/dL (ref 6.0–8.3)

## 2012-02-18 LAB — CBC
HCT: 37.5 % (ref 36.0–46.0)
Hemoglobin: 12.8 g/dL (ref 12.0–15.0)
MCHC: 32.9 g/dL (ref 30.0–36.0)
MCV: 97.7 fL (ref 78.0–100.0)
RBC: 3.94 MIL/uL (ref 3.87–5.11)
RDW: 14 % (ref 11.5–15.5)
WBC: 6.3 10*3/uL (ref 4.0–10.5)
WBC: 6.7 10*3/uL (ref 4.0–10.5)

## 2012-02-18 LAB — POCT I-STAT, CHEM 8
Chloride: 104 mEq/L (ref 96–112)
Glucose, Bld: 136 mg/dL — ABNORMAL HIGH (ref 70–99)
HCT: 39 % (ref 36.0–46.0)
Potassium: 3.3 mEq/L — ABNORMAL LOW (ref 3.5–5.1)

## 2012-02-18 LAB — TROPONIN I: Troponin I: <0.3 ng/mL (ref <0.30)

## 2012-02-18 LAB — GLUCOSE, CAPILLARY: Glucose-Capillary: 142 mg/dL — ABNORMAL HIGH (ref 70–99)

## 2012-02-18 MED ORDER — INSULIN ASPART 100 UNIT/ML ~~LOC~~ SOLN
0.0000 [IU] | Freq: Three times a day (TID) | SUBCUTANEOUS | Status: DC
  Administered 2012-02-19: 1 [IU] via SUBCUTANEOUS

## 2012-02-18 MED ORDER — IOHEXOL 350 MG/ML SOLN
50.0000 mL | Freq: Once | INTRAVENOUS | Status: AC | PRN
  Administered 2012-02-18: 50 mL via INTRAVENOUS

## 2012-02-18 MED ORDER — MIRTAZAPINE 30 MG PO TABS
30.0000 mg | ORAL_TABLET | Freq: Every day | ORAL | Status: DC
  Filled 2012-02-18 (×2): qty 1

## 2012-02-18 MED ORDER — BISOPROLOL FUMARATE 10 MG PO TABS
10.0000 mg | ORAL_TABLET | Freq: Every day | ORAL | Status: DC
  Administered 2012-02-19: 10 mg via ORAL
  Filled 2012-02-18: qty 1

## 2012-02-18 MED ORDER — MIRTAZAPINE 30 MG PO TABS
30.0000 mg | ORAL_TABLET | Freq: Once | ORAL | Status: AC
  Administered 2012-02-18: 30 mg via ORAL
  Filled 2012-02-18: qty 1

## 2012-02-18 MED ORDER — HEPARIN SODIUM (PORCINE) 5000 UNIT/ML IJ SOLN
5000.0000 [IU] | Freq: Three times a day (TID) | INTRAMUSCULAR | Status: DC
  Administered 2012-02-18 – 2012-02-19 (×2): 5000 [IU] via SUBCUTANEOUS
  Filled 2012-02-18 (×5): qty 1

## 2012-02-18 MED ORDER — LORAZEPAM 0.5 MG PO TABS
0.5000 mg | ORAL_TABLET | Freq: Four times a day (QID) | ORAL | Status: DC | PRN
  Administered 2012-02-18 – 2012-02-19 (×2): 0.5 mg via ORAL
  Filled 2012-02-18 (×2): qty 1

## 2012-02-18 MED ORDER — FUROSEMIDE 40 MG PO TABS
60.0000 mg | ORAL_TABLET | Freq: Every day | ORAL | Status: DC
  Administered 2012-02-19: 60 mg via ORAL
  Filled 2012-02-18: qty 1

## 2012-02-18 MED ORDER — ASPIRIN 325 MG PO TABS
325.0000 mg | ORAL_TABLET | Freq: Every day | ORAL | Status: DC
  Administered 2012-02-19: 325 mg via ORAL
  Filled 2012-02-18: qty 1

## 2012-02-18 MED ORDER — GABAPENTIN 300 MG PO CAPS
300.0000 mg | ORAL_CAPSULE | Freq: Once | ORAL | Status: AC
  Administered 2012-02-18: 300 mg via ORAL
  Filled 2012-02-18 (×2): qty 1

## 2012-02-18 MED ORDER — POTASSIUM CHLORIDE CRYS ER 20 MEQ PO TBCR
20.0000 meq | EXTENDED_RELEASE_TABLET | Freq: Every day | ORAL | Status: DC
  Administered 2012-02-18 – 2012-02-19 (×2): 20 meq via ORAL
  Filled 2012-02-18 (×2): qty 1

## 2012-02-18 MED ORDER — SIMVASTATIN 10 MG PO TABS
10.0000 mg | ORAL_TABLET | Freq: Every day | ORAL | Status: DC
  Administered 2012-02-18: 10 mg via ORAL
  Filled 2012-02-18 (×2): qty 1

## 2012-02-18 NOTE — Progress Notes (Signed)
. PCP:   Benita Stabile, MD, MD   Chief Complaint:  Left her Dental office this and felt eyes got covered over the thin film in vision felt a little blurry.  Drove top the local [employer] and got what she needed at the checkout and couldn;t remember her Pin number and then she tried to do it several times and then started to write a check and depsite her trying.  .  thoguht this might have been hypoglycemia and ate a chicken sandwich and then ggot home and called Dr. Clovis Riley and was referred here for eval.  Sister who brought her here didn't notice any slurred speech, no loss of conc, no dizzyness, no SOB out of ordinary, no CP.  NO recent falls.  Doesn;tusually have spells where she get sconfused.  Sugars has been higher than usual lately.  Was perfectly normal thius am when aaokeand got oob.   NO Seizures, no loss of bowel and bladder, But has incontinence.  Has been taking a lot of FLuid pills lately-saw her PCP about 10 days ago, and a neurologist then as well  Review of Systems:  The patient denies  fever, weight loss, vision loss seems to be somehwat better right now-but still + in both eyes, decreased hearing, hoarseness, +chest pain on exertion, syncope,+ dyspnea on exertion, peripheral edema, + balance deficits, hemoptysis, abdominal pain, melena, hematochezia, severe indigestion/heartburn, hematuria, incontinence, genital sores, muscle weakness, transient blindness, difficulty walking,some element of depression, abnormal bleeding, enlarged lymph nodes, angioedema, and breast masses.  Past Medical History: known history of IHSS and a past history of  bradycardia syncope, for which she had a dual-chamber pacemaker inserted in  1998 by Dr. Berton Mount. IHSS was discovered.  Sick sinus syndrome Explant pacer from may 1998, [employer] placed 01/02/05-needs to have this re-eval  Back pain with L4-l5, l5-s1 Laminotomy [facility] 2007-anxiety suicidality Syncope 2008 Afib s/p Cardiac Cath January 16, 2007 Live lesion PAD with claudication 2003-casuing pseudoaneurysm Mitral regurg by R heart cath and elevate dpulm art pressure 09/07/09 last echocardiogram was 07/10/11 showed a vigorous left ventricular systolic function with near obliteration of the left ventricular cavity during most of systole. The patient had a nuclear stress test on 08/15/11 which did not show any reversible ischemia Former tobacco user Chronic OA Knee pain L ICA stenosis 50-70% per Ct angio L side COPD H/o TIA 12-15 yr ago-passed out   Past surgical history: Past Surgical History  Procedure Date  . Knee arthroplasty 2011    right knee  . Tonsillectomy   . Appendectomy   . Cardiac catheterization   . Insert / replace / remove pacemaker   . Other surgical history     hysterectomy    Medications: Prior to Admission medications   Medication Sig Start Date End Date Taking? Authorizing Provider  Ascorbic Acid (VITAMIN C) 500 MG tablet Take 500 mg by mouth daily.     Yes Historical Provider, MD  aspirin EC 81 MG tablet Take 81 mg by mouth daily.   Yes Historical Provider, MD  bisoprolol (ZEBETA) 10 MG tablet Take 10 mg by mouth daily. Take 10 mg. One daily 12/02/11  Yes Cassell Clement, MD  calcium carbonate (OS-CAL) 600 MG TABS Take 600 mg by mouth daily.     Yes Historical Provider, MD  Cholecalciferol (VITAMIN D) 2000 UNITS CAPS Take 1 capsule by mouth daily.     Yes Historical Provider, MD  diltiazem (CARDIZEM) 120 MG tablet Take 120 mg by  mouth 2 (two) times daily. 04/01/11  Yes Cassell Clement, MD  famotidine (PEPCID) 20 MG tablet Take 20 mg by mouth 2 (two) times daily. As needed   Yes Historical Provider, MD  furosemide (LASIX) 40 MG tablet Take 60 mg by mouth daily.    Yes Historical Provider, MD  gabapentin (NEURONTIN) 300 MG capsule Take 300-600 mg by mouth 4 (four) times daily. Takes 1 tablet in the am, 2 at lunch, 2 dinner and 2 at bedtime   Yes Historical Provider, MD  HYDROcodone-acetaminophen  (NORCO) 10-325 MG per tablet Take 1 tablet by mouth every 6 (six) hours as needed. pain   Yes Historical Provider, MD  LORazepam (ATIVAN) 0.5 MG tablet Take 0.5 mg by mouth every 6 (six) hours as needed. For anxiety   Yes Historical Provider, MD  losartan (COZAAR) 100 MG tablet Take 1 tablet (100 mg total) by mouth daily. 03/13/11 03/12/12 Yes Cassell Clement, MD  magnesium oxide (MAG-OX) 400 MG tablet Take 400 mg by mouth daily.    Yes Historical Provider, MD  metFORMIN (GLUCOPHAGE) 500 MG tablet Take 500 mg by mouth daily. Dr. Clovis Riley Rx    Yes Historical Provider, MD  mirtazapine (REMERON) 30 MG tablet Take 30 mg by mouth at bedtime.   Yes Historical Provider, MD  potassium chloride SA (K-DUR,KLOR-CON) 20 MEQ tablet Take 20 mEq by mouth daily. Taking the days she takes lasix 03/26/11  Yes Cassell Clement, MD  simvastatin (ZOCOR) 10 MG tablet Take 1 tablet (10 mg total) by mouth at bedtime. 11/18/11  Yes Cassell Clement, MD    Allergies:   Allergies  Allergen Reactions  . Calan (Verapamil Hcl) Other (See Comments)    Patient states it makes her "out of her mind"  . Crestor (Rosuvastatin Calcium) Other (See Comments)    Muscle pain  . Lexapro Other (See Comments)    Unknown   . Lipitor (Atorvastatin Calcium) Other (See Comments)    myalgias  . Lopressor (Metoprolol Tartrate) Other (See Comments)    Blood pressure bottoms out   . Norvasc (Amlodipine Besylate) Other (See Comments)    fatigue  . Penicillins Hives and Rash    Social History:  reports that she quit smoking about 6 years ago. Her smoking use included Cigarettes. She has never used smokeless tobacco. She reports that she does not drink alcohol or use illicit drugs.  Family History: Family History  Problem Relation Age of Onset  . Other Father   . Cancer Mother     Physical Exam: Filed Vitals:   02/18/12 1630 02/18/12 1709 02/18/12 1725 02/18/12 1730  BP: 129/62  134/62 151/64  Pulse: 66  65 66  Temp:  98.6 F (37  C) 98.7 F (37.1 C)   TempSrc:   Oral   Resp:   19 16  Height:      Weight:      SpO2: 98%  99% 98%    HEENT- alert pleasant Caucasian female in no apparent distress, no slurred speech or facial asymmetry CHEST-clinically clear and no added sound CARDS-S1-S2 irregular rate rhythm but rate controlled no significant murmur ABD-soft nontender nondistended SKIN-trace lower extremity edema NEURO-alert, oriented x3 speech: normal in context and clarity memory: intact grossly cranial nerves II-XII: intact motor strength: full proximally and distally no involuntary movements or tremors sensation: intact to vibration, pain, and light touch cerebellar: finger-to-nose and heel-to-shin intact reflexes: full and symmetric plantar responses: downgoing bilaterally   Labs on Admission:   [facility] 02/18/12 1630  02/18/12 1608  NA 141 138  K 3.3* 3.2*  CL 104 100  CO2 -- 25  GLUCOSE 136* 130*  BUN 18 17  CREATININE 1.10 0.91  CALCIUM -- 9.7  MG -- --  PHOS -- --    Basename 02/18/12 1608  AST 31  ALT 10  ALKPHOS 91  BILITOT 1.1  PROT 7.3  ALBUMIN 3.9   No results found for this basename: LIPASE:2,AMYLASE:2 in the last 72 hours  Basename 02/18/12 1630 02/18/12 1608  WBC -- 6.3  NEUTROABS -- 3.8  HGB 13.3 12.7  HCT 39.0 37.5  MCV -- 97.7  PLT -- 212    Basename 02/18/12 1608  CKTOTAL 122  CKMB 3.3  CKMBINDEX --  TROPONINI <0.30   No results found for this basename: TSH,T4TOTAL,FREET3,T3FREE,THYROIDAB in the last 72 hours No results found for this basename: VITAMINB12:2,FOLATE:2,FERRITIN:2,TIBC:2,IRON:2,RETICCTPCT:2 in the last 72 hours  Radiological Exams on Admission: Dg Chest 2 View  02/11/2012  *RADIOLOGY REPORT*  Clinical Data: Increasing shortness of breath.  History of congestive heart failure.  CHEST - 2 VIEW  Comparison: Two-view chest 12/21/2011.  Findings: The heart is enlarged.  Pacing wires are in place.  A mild interstitial pattern has increased since  the prior exam.  Mild bibasilar atelectasis is noted as well.  Tiny bilateral pleural effusions are not excluded.  IMPRESSION:  1.  Cardiomegaly with slight increase in a diffuse interstitial pattern suggesting edema and early congestive heart failure. 2.  Question small bilateral pleural effusions. 3.  Mild bibasilar atelectasis.  Original Report Authenticated By: Jamesetta Orleans. MATTERN, M.D.   Ct Head Wo Contrast  02/18/2012  *RADIOLOGY REPORT*  Clinical Data: Altered mental status and visual field change.  CT HEAD WITHOUT CONTRAST  Technique:  Contiguous axial images were obtained from the base of the skull through the vertex without contrast.  Comparison: 01/06/2012  Findings: There are patchy low density areas involving the subcortical and periventricular white matter.  The distribution of the white matter disease has not significantly changed.  Stable appearance of the ventricles and basal cisterns.  No evidence for acute hemorrhage, mass lesion, midline shift, hydrocephalus or large infarct.  The visualized sinuses are clear.  No acute bony abnormality.  IMPRESSION: No acute intracranial abnormality.  Scattered low density throughout the white matter most likely represents chronic small vessel ischemic changes.  Original Report Authenticated By: Richarda Overlie, M.D.    Assessment/Plan 1-TIA-because of pacer cannot get MRI/MRA will get full workup including lipid panel, A1c, CT angiogram pending, likely will need to be on aspirin or Plavix her her neurologist who is seen in the emergency room. She has an elevated [place] score and may necessitate Coumadin but her cardiologist took her off this because of frequent falls. She may benefit from being on accommodation aspirin Plavix we will let this be determined by a neurologist. Echocardiogram is pending. I placed him to to her milligrams of aspirin and held her aspirin 81 mg  2 IHSS/atrial fibrillation-EF done recently by echocardiogram was near normal with  vigorous systolic function. I will continue her bisoprolol only and hold her diltiazem at current time given I do not want to aggressively control her blood pressure as this may cause further ischemia. We will reimplement full antihypertensives in the a.m.  3 hypertension- see above.  Goal blood pressure over the next 24 hours is up to 220/100  4 recently diagnosed diabetes-A1c is pending. Patient does take metformin which we will hold given CT contrast.  We will check her blood sugars and cover if needed overnight.  5 hyperlipidemia-lipid panel pending. Continue statin-Zocor 10 mg.  6-sick sinus syndrome-has a pacemaker in which will need to be replaced. Outpatient follow up with cardiologist  7-anxiety/depression and admission to be a change in the past-continue Ativan q 6 hours.  Partial code Admit to triad hospitalist team 8 Family informed   [facility] 02/18/2012, 6:44 PM

## 2012-02-18 NOTE — ED Notes (Signed)
EKGs given to Dr Lynelle Doctor

## 2012-02-18 NOTE — ED Notes (Signed)
3010-01 Ready 

## 2012-02-18 NOTE — ED Notes (Signed)
Code Stroke per RN, Melford Aase. [facility] notified. 1606

## 2012-02-18 NOTE — ED Notes (Signed)
Patient with reported acute onset of vision changes and dizziness.  She also reports confusion at 1400 today.

## 2012-02-18 NOTE — ED Notes (Signed)
16:46 first cbg 79 at 17:15 cbg was 71, they are not crossing over on meter. Nurse is aware.

## 2012-02-18 NOTE — Code Documentation (Signed)
76 year old female presented to ED by pvt vehicle at 1554 with c/o bilateral blurred vision.  Code stroke was called in Triage by RN Kristi at [phone].  Stroke team arrival at 1605.  Patient to CT scan at 1607.  LSW was 1345 per patient.  Patient states she has experienced blurred vision several times over past few weeks - would clear then come back at intervals.  Today in dentist office began to have blurred vision bilateral - describes it as "film over eyes".  NIHSS 0.  Dr. Roseanne Reno to room - exam completed - code stroke cancelled at 1625.

## 2012-02-18 NOTE — ED Provider Notes (Signed)
History     CSN: [id number]  Arrival date & time 02/18/12  1554   First MD Initiated Contact with Patient 02/18/12 1619      Chief Complaint  Patient presents with  . Visual Field Change  . Altered Mental Status  . Dizziness   CODE STROKE  (Consider location/radiation/quality/duration/timing/severity/associated sxs/prior treatment) HPI  Patient relates she had a dentist appointment today but did not get any injections done. She states afterwards between 2:30 and 3 she was in [employer] checking out and she could not recall her PIN number for her debit card. She then tried to write a check but could not remember how to write a check. She states she felt like her vision had a film over both eyes. And she felt like she was having some memory problems. She states she still feels like her vision is not right. She denies any difficulty walking or using her arms. She denies headache. Patient relates recently she's been taking a lot of extra fluid pills and she thinks she may have a "sugar problems" today. She relates she had a TIA before about 10 years ago but that was presented as a syncopal episode.  PCP Dr Lupe Carney Cardiology Dr Patty Sermons  Past Medical History  Diagnosis Date  . Hypertrophic cardiomyopathy   . Hypertension   . Osteoarthritis   . Situational mixed anxiety and depressive disorder   . Hypercholesterolemia   . Atrial fibrillation     Chronic; No longer on coumadin  . Pacemaker   . Falls frequently     coumadin stopped  . Pulmonary HTN     has had prior right heart cath in 2010; was felt that most likely due to elevated left sided pressures and would not benefit from vasodilator therapy  . Diabetes mellitus   . Pacemaker   . Oxygen dependent   . COPD (chronic obstructive pulmonary disease)   . TIA (transient ischemic attack) 10 years ago     Past Surgical History  Procedure Date  . Knee arthroplasty 2011    right knee  . Tonsillectomy   . Appendectomy   .  Cardiac catheterization   . Insert / replace / remove pacemaker   . Other surgical history     hysterectomy    Family History  Problem Relation Age of Onset  . Other Father   . Cancer Mother     History  Substance Use Topics  . Smoking status: Former Smoker    Types: Cigarettes    Quit date: 11/15/2005  . Smokeless tobacco: Never Used  . Alcohol Use: No  lives alone widowed  OB History    Grav Para Term Preterm Abortions TAB SAB Ect Mult Living                  Review of Systems  All other systems reviewed and are negative.    Allergies  Calan; Crestor; Lexapro; Lipitor; Lopressor; Norvasc; and Penicillins  Home Medications   Current Outpatient Rx  Name Route Sig Dispense Refill  . VITAMIN C 500 MG PO TABS Oral Take 500 mg by mouth daily.      . ASPIRIN 81 MG PO TABS Oral Take 81 mg by mouth daily.      [name] BISOPROLOL FUMARATE 10 MG PO TABS  Take 10 mg. One daily 90 tablet 3  . CALCIUM CARBONATE 600 MG PO TABS Oral Take 600 mg by mouth daily.      [name] VITAMIN D 2000 UNITS  PO CAPS Oral Take 1 capsule by mouth daily.      [name] DILTIAZEM HCL 120 MG PO TABS Oral Take 120 mg by mouth 2 (two) times daily.    [name] FAMOTIDINE 20 MG PO TABS Oral Take 20 mg by mouth 2 (two) times daily. As needed    . FUROSEMIDE 40 MG PO TABS Oral Take 80 mg by mouth daily.     [name] GABAPENTIN 300 MG PO CAPS Oral Take 300-600 mg by mouth 4 (four) times daily. Takes 1 tablet in the am, 2 at lunch, 2 dinner and 2 at bedtime    . HYDROCODONE-ACETAMINOPHEN 10-325 MG PO TABS Oral Take 1 tablet by mouth every 6 (six) hours as needed. pain    . LORAZEPAM 0.5 MG PO TABS Oral Take 0.5 mg by mouth every 6 (six) hours as needed. For anxiety    . LOSARTAN POTASSIUM 100 MG PO TABS Oral Take 1 tablet (100 mg total) by mouth daily. 30 tablet 11  . MAGNESIUM OXIDE 400 MG PO TABS Oral Take 400 mg by mouth daily.     [name] METFORMIN HCL 500 MG PO TABS Oral Take 500 mg by mouth daily. Dr. Clovis Riley Rx     . MIRTAZAPINE 30 MG  PO TABS Oral Take 30 mg by mouth at bedtime.    [name] POTASSIUM CHLORIDE CRYS ER 20 MEQ PO TBCR Oral Take 20 mEq by mouth daily. Taking the days she takes lasix    . SIMVASTATIN 10 MG PO TABS Oral Take 1 tablet (10 mg total) by mouth at bedtime. 90 tablet 3    BP 129/62  Pulse 66  Temp(Src) 98.6 F (37 C) (Oral)  Resp 19  Ht 5\' 4"  (1.626 m)  Wt 149 lb (67.586 kg)  BMI 25.58 kg/m2  SpO2 98%  Vital signs normal    Physical Exam  Nursing note and vitals reviewed. Constitutional: She is oriented to person, place, and time. She appears well-developed and well-nourished.  Non-toxic appearance. She does not appear ill. No distress.  HENT:  Head: Normocephalic and atraumatic.  Right Ear: External ear normal.  Left Ear: External ear normal.  Nose: Nose normal. No mucosal edema or rhinorrhea.  Mouth/Throat: Oropharynx is clear and moist and mucous membranes are normal. No dental abscesses or uvula swelling.  Eyes: Conjunctivae and EOM are normal. Pupils are equal, round, and reactive to light.  Neck: Normal range of motion and full passive range of motion without pain. Neck supple.  Cardiovascular: Normal rate, regular rhythm and normal heart sounds.  Exam reveals no gallop and no friction rub.   No murmur heard. Pulmonary/Chest: Effort normal and breath sounds normal. No respiratory distress. She has no wheezes. She has no rhonchi. She has no rales. She exhibits no tenderness and no crepitus.  Abdominal: Soft. Normal appearance and bowel sounds are normal. She exhibits no distension. There is no tenderness. There is no rebound and no guarding.  Musculoskeletal: Normal range of motion. She exhibits no edema and no tenderness.       Moves all extremities well.   Neurological: She is alert and oriented to person, place, and time. She has normal strength. No cranial nerve deficit.       No pronator drift, no ataxia, she has equal grips and strength in her lower legs.  Skin: Skin is warm, dry and  intact. No rash noted. No erythema. No pallor.  Psychiatric: She has a normal mood and affect. Her speech is normal and  behavior is normal. Her mood appears not anxious.    ED Course  Procedures (including critical care time)  1620 Dr. Roseanne Reno is in the room with the stroke team. He states her stroke score is 0. He wants her to have a CTA of her head/neck with contrast since she cannot have a MRI because of her pacemaker.  18:39 Dr Burnett Harry here in ED and will admit.  Results for orders placed during the hospital encounter of 02/18/12  PROTIME-INR      Component Value Range   Prothrombin Time 15.1  11.6 - 15.2 (seconds)   INR 1.17  0.00 - 1.49   APTT      Component Value Range   aPTT 36  24 - 37 (seconds)  CBC      Component Value Range   WBC 6.3  4.0 - 10.5 (K/uL)   RBC 3.84 (*) 3.87 - 5.11 (MIL/uL)   Hemoglobin 12.7  12.0 - 15.0 (g/dL)   HCT 21.3  08.6 - 57.8 (%)   MCV 97.7  78.0 - 100.0 (fL)   MCH 33.1  26.0 - 34.0 (pg)   MCHC 33.9  30.0 - 36.0 (g/dL)   RDW 46.9  62.9 - 52.8 (%)   Platelets 212  150 - 400 (K/uL)  DIFFERENTIAL      Component Value Range   Neutrophils Relative 62  43 - 77 (%)   Lymphocytes Relative 28  12 - 46 (%)   Monocytes Relative 5  3 - 12 (%)   Eosinophils Relative 4  0 - 5 (%)   Basophils Relative 1  0 - 1 (%)   Neutro Abs 3.8  1.7 - 7.7 (K/uL)   Lymphs Abs 1.8  0.7 - 4.0 (K/uL)   Monocytes Absolute 0.3  0.1 - 1.0 (K/uL)   Eosinophils Absolute 0.3  0.0 - 0.7 (K/uL)   Basophils Absolute 0.1  0.0 - 0.1 (K/uL)   WBC Morphology ATYPICAL LYMPHOCYTES    COMPREHENSIVE METABOLIC PANEL      Component Value Range   Sodium 138  135 - 145 (mEq/L)   Potassium 3.2 (*) 3.5 - 5.1 (mEq/L)   Chloride 100  96 - 112 (mEq/L)   CO2 25  19 - 32 (mEq/L)   Glucose, Bld 130 (*) 70 - 99 (mg/dL)   BUN 17  6 - 23 (mg/dL)   Creatinine, Ser 4.13  0.50 - 1.10 (mg/dL)   Calcium 9.7  8.4 - 24.4 (mg/dL)   Total Protein 7.3  6.0 - 8.3 (g/dL)   Albumin 3.9  3.5 - 5.2 (g/dL)     AST 31  0 - 37 (U/L)   ALT 10  0 - 35 (U/L)   Alkaline Phosphatase 91  39 - 117 (U/L)   Total Bilirubin 1.1  0.3 - 1.2 (mg/dL)   GFR calc non Af Amer 61 (*) >90 (mL/min)   GFR calc Af Amer 71 (*) >90 (mL/min)  CK TOTAL AND CKMB      Component Value Range   Total CK 122  7 - 177 (U/L)   CK, MB 3.3  0.3 - 4.0 (ng/mL)   Relative Index 2.7 (*) 0.0 - 2.5   TROPONIN I      Component Value Range   Troponin I <0.30  <0.30 (ng/mL)  GLUCOSE, CAPILLARY      Component Value Range   Glucose-Capillary 142 (*) 70 - 99 (mg/dL)  POCT I-STAT, CHEM 8      Component Value Range  Sodium 141  135 - 145 (mEq/L)   Potassium 3.3 (*) 3.5 - 5.1 (mEq/L)   Chloride 104  96 - 112 (mEq/L)   BUN 18  6 - 23 (mg/dL)   Creatinine, Ser 6.57  0.50 - 1.10 (mg/dL)   Glucose, Bld 846 (*) 70 - 99 (mg/dL)   Calcium, Ion 9.62  9.52 - 1.32 (mmol/L)   TCO2 25  0 - 100 (mmol/L)   Hemoglobin 13.3  12.0 - 15.0 (g/dL)   HCT 84.1  32.4 - 40.1 (%)   Laboratory interpretation all normal except hypokalemia  Ct Angio Head W/cm &/or Wo Cm  02/18/2012  *RADIOLOGY REPORT*  Clinical Data:  Several episodes of blurred vision.  Pacemaker.  CT ANGIOGRAPHY HEAD AND NECK  Technique:  Multidetector CT imaging of the head and neck was performed using the standard protocol during bolus administration of intravenous contrast.  Multiplanar CT image reconstructions including MIPs were obtained to evaluate the vascular anatomy. Carotid stenosis measurements (when applicable) are obtained utilizing NASCET criteria, using the distal internal carotid diameter as the denominator.  Contrast: 50mL OMNIPAQUE IOHEXOL 350 MG/ML SOLN  Comparison:  Several prior head CTs most recent 02/18/2012.  CTA NECK  Findings:  Atherosclerotic type changes aortic arch. Atherosclerotic type changes with mild narrowing origin of the great vessels.  Atherosclerotic type changes with mild narrowing right common carotid artery.  Right carotid bifurcation plaque.  42% diameter  stenosis proximal right internal carotid artery.  Ectatic right internal carotid artery mid to distal vertical cervical segment with fold and plaque with mild narrowing.  Plaque proximal left common carotid artery with 40% narrowing.  Complex plaque left carotid bifurcation. 64% diameter stenosis proximal left internal carotid artery.  Plaque with mild narrowing right subclavian artery.  Plaque with mild narrowing proximal right vertebral artery.  Plaque with moderate narrowing proximal left vertebral artery.  Cervical spondylotic changes with spinal stenosis and mild cord flattening C5-6 and C6-7.  Mediastinal adenopathy.  Peripheral honeycombing and septation of the upper lung zones greater on the right most consistent with pulmonary fibrosis. CT chest can be obtained for further delineation.   Review of the MIP images confirms the above findings.  IMPRESSION: Atherosclerotic type changes with most notable finding the 64% diameter stenosis of the proximal left internal carotid artery.  Moderate narrowing proximal left vertebral artery. Please see above for further detail.  Mediastinal adenopathy.  Peripheral honeycombing and septation of the upper lung zones greater on the right most consistent with pulmonary fibrosis. CT chest can be obtained for further delineation.  CTA HEAD  Findings:  No intracranial hemorrhage.  Moderate small vessel disease type changes.  No CT evidence of large acute infarct.  Global atrophy without hydrocephalus.  No intracranial mass abnormal enhancement.  Calcification with mild to moderate narrowing involving the internal carotid artery cavernous segment bilaterally.  Mild to moderate to narrowing M1 segment right middle cerebral artery.  Mild narrowing M1 segment left middle cerebral artery.  Middle cerebral artery mild branch vessel irregularity.  A2 segment anterior cerebral artery mild branch vessel irregularity bilaterally.  Mild calcification and mild narrowing of the distal  vertebral arteries.  Mild to slightly moderate narrowing proximal basilar artery.  No discrete aneurysm.  No vascular malformation.   Review of the MIP images confirms the above findings.  IMPRESSION: Intracranial atherosclerotic type changes as detailed above.  Original Report Authenticated By: Fuller Canada, M.D.   Ct Head Wo Contrast  02/18/2012  *RADIOLOGY REPORT*  Clinical Data:  Altered mental status and visual field change.  CT HEAD WITHOUT CONTRAST  Technique:  Contiguous axial images were obtained from the base of the skull through the vertex without contrast.  Comparison: 01/06/2012  Findings: There are patchy low density areas involving the subcortical and periventricular white matter.  The distribution of the white matter disease has not significantly changed.  Stable appearance of the ventricles and basal cisterns.  No evidence for acute hemorrhage, mass lesion, midline shift, hydrocephalus or large infarct.  The visualized sinuses are clear.  No acute bony abnormality.  IMPRESSION: No acute intracranial abnormality.  Scattered low density throughout the white matter most likely represents chronic small vessel ischemic changes.  Original Report Authenticated By: Richarda Overlie, M.D.   Ct Angio Neck W/cm &/or Wo/cm  02/18/2012  *RADIOLOGY REPORT*  Clinical Data:  Several episodes of blurred vision.  Pacemaker.  CT ANGIOGRAPHY HEAD AND NECK  Technique:  Multidetector CT imaging of the head and neck was performed using the standard protocol during bolus administration of intravenous contrast.  Multiplanar CT image reconstructions including MIPs were obtained to evaluate the vascular anatomy. Carotid stenosis measurements (when applicable) are obtained utilizing NASCET criteria, using the distal internal carotid diameter as the denominator.  Contrast: 50mL OMNIPAQUE IOHEXOL 350 MG/ML SOLN  Comparison:  Several prior head CTs most recent 02/18/2012.  CTA NECK  Findings:  Atherosclerotic type changes aortic  arch. Atherosclerotic type changes with mild narrowing origin of the great vessels.  Atherosclerotic type changes with mild narrowing right common carotid artery.  Right carotid bifurcation plaque.  42% diameter stenosis proximal right internal carotid artery.  Ectatic right internal carotid artery mid to distal vertical cervical segment with fold and plaque with mild narrowing.  Plaque proximal left common carotid artery with 40% narrowing.  Complex plaque left carotid bifurcation. 64% diameter stenosis proximal left internal carotid artery.  Plaque with mild narrowing right subclavian artery.  Plaque with mild narrowing proximal right vertebral artery.  Plaque with moderate narrowing proximal left vertebral artery.  Cervical spondylotic changes with spinal stenosis and mild cord flattening C5-6 and C6-7.  Mediastinal adenopathy.  Peripheral honeycombing and septation of the upper lung zones greater on the right most consistent with pulmonary fibrosis. CT chest can be obtained for further delineation.   Review of the MIP images confirms the above findings.  IMPRESSION: Atherosclerotic type changes with most notable finding the 64% diameter stenosis of the proximal left internal carotid artery.  Moderate narrowing proximal left vertebral artery. Please see above for further detail.  Mediastinal adenopathy.  Peripheral honeycombing and septation of the upper lung zones greater on the right most consistent with pulmonary fibrosis. CT chest can be obtained for further delineation.  CTA HEAD  Findings:  No intracranial hemorrhage.  Moderate small vessel disease type changes.  No CT evidence of large acute infarct.  Global atrophy without hydrocephalus.  No intracranial mass abnormal enhancement.  Calcification with mild to moderate narrowing involving the internal carotid artery cavernous segment bilaterally.  Mild to moderate to narrowing M1 segment right middle cerebral artery.  Mild narrowing M1 segment left middle  cerebral artery.  Middle cerebral artery mild branch vessel irregularity.  A2 segment anterior cerebral artery mild branch vessel irregularity bilaterally.  Mild calcification and mild narrowing of the distal vertebral arteries.  Mild to slightly moderate narrowing proximal basilar artery.  No discrete aneurysm.  No vascular malformation.   Review of the MIP images confirms the above findings.  IMPRESSION: Intracranial atherosclerotic type changes  as detailed above.  Original Report Authenticated By: Fuller Canada, M.D.    Date: 02/18/2012  Rate: 65  Rhythm: Paced rhythm  Old EKG Reviewed: changes noted from 01/02/2011 was in atrial fibrillation    1. Stroke     Plan admission   Devoria Albe, MD, FACEP   MDM          Ward Givens, MD 02/18/12 [phone]

## 2012-02-19 DIAGNOSIS — G459 Transient cerebral ischemic attack, unspecified: Secondary | ICD-10-CM | POA: Diagnosis present

## 2012-02-19 DIAGNOSIS — I369 Nonrheumatic tricuspid valve disorder, unspecified: Secondary | ICD-10-CM

## 2012-02-19 LAB — HEMOGLOBIN A1C: Mean Plasma Glucose: 123 mg/dL — ABNORMAL HIGH (ref <117)

## 2012-02-19 LAB — CREATININE, SERUM
Creatinine, Ser: 0.81 mg/dL (ref 0.50–1.10)
GFR calc Af Amer: 82 mL/min — ABNORMAL LOW (ref >90)
GFR calc non Af Amer: 70 mL/min — ABNORMAL LOW (ref >90)

## 2012-02-19 LAB — RAPID URINE DRUG SCREEN, HOSP PERFORMED
Barbiturates: NOT DETECTED
Tetrahydrocannabinol: NOT DETECTED

## 2012-02-19 LAB — LIPID PANEL
Cholesterol: 146 mg/dL (ref 0–200)
HDL: 41 mg/dL (ref >39)
Total CHOL/HDL Ratio: 3.6 RATIO

## 2012-02-19 MED ORDER — MORPHINE SULFATE 2 MG/ML IJ SOLN
1.0000 mg | Freq: Once | INTRAMUSCULAR | Status: AC
  Administered 2012-02-19: 1 mg via INTRAVENOUS

## 2012-02-19 MED ORDER — ASPIRIN 325 MG PO TABS: 325.0000 mg | ORAL_TABLET | Freq: Every day | ORAL | Status: DC

## 2012-02-19 MED ORDER — ACETAMINOPHEN 325 MG PO TABS
650.0000 mg | ORAL_TABLET | Freq: Four times a day (QID) | ORAL | Status: DC | PRN
  Administered 2012-02-19: 650 mg via ORAL
  Filled 2012-02-19: qty 2

## 2012-02-19 MED ORDER — MORPHINE SULFATE 2 MG/ML IJ SOLN
INTRAMUSCULAR | Status: AC
  Administered 2012-02-19: 1 mg via INTRAVENOUS
  Filled 2012-02-19: qty 1

## 2012-02-19 NOTE — Progress Notes (Signed)
Patient D/C instructions and stroke education provided to patient and patient's sister. "[facility]" Stroke Education Manual given and discussed. No further questions asked. Patient D/C home.

## 2012-02-19 NOTE — Progress Notes (Signed)
*  PRELIMINARY RESULTS* Vascular Ultrasound Carotid Duplex (Doppler) has been completed.  Left proximal internal carotid artery demonstrates mid range 60-79% stenosis. No evidence of right internal carotid artery stenosis. Bilateral antegrade vertebral artery flow.  Malachy Moan, RDMS, RDCS 02/19/2012, 4:39 PM

## 2012-02-19 NOTE — Discharge Instructions (Signed)
STROKE/TIA DISCHARGE INSTRUCTIONS SMOKING Cigarette smoking nearly doubles your risk of having a stroke & is the single most alterable risk factor  If you smoke or have smoked in the last 12 months, you are advised to quit smoking for your health.  Most of the excess cardiovascular risk related to smoking disappears within a year of stopping.  Ask you doctor about anti-smoking medications  [place] Quit Line: 1-800-QUIT NOW  Free Smoking Cessation Classes [phone]  CHOLESTEROL Know your levels; limit fat & cholesterol in your diet  Lipid Panel     Component Value Date/Time   CHOL 156 10/18/2011 1015   TRIG 141.0 10/18/2011 1015   HDL 41.00 10/18/2011 1015   CHOLHDL 4 10/18/2011 1015   VLDL 28.2 10/18/2011 1015   LDLCALC 87 10/18/2011 1015      Many patients benefit from treatment even if their cholesterol is at goal.  Goal: Total Cholesterol (CHOL) less than 160  Goal:  Triglycerides (TRIG) less than 150  Goal:  HDL greater than 40  Goal:  LDL (LDLCALC) less than 100   BLOOD PRESSURE American Stroke Association blood pressure target is less that 120/80 mm/Hg  Your discharge blood pressure is:  BP: 128/75 mmHg  Monitor your blood pressure  Limit your salt and alcohol intake  Many individuals will require more than one medication for high blood pressure  DIABETES (A1c is a blood sugar average for last 3 months) Goal HGBA1c is under 7% (HBGA1c is blood sugar average for last 3 months)  Diabetes: {STROKE DC DIABETES:22357}    Lab Results  Component Value Date   HGBA1C  Value: 6.5 (NOTE)                                                                       According to the ADA Clinical Practice Recommendations for 2011, when HbA1c is used as a screening test:   >=6.5%   Diagnostic of Diabetes Mellitus           (if abnormal result  is confirmed)  5.7-6.4%   Increased risk of developing Diabetes Mellitus  References:Diagnosis and Classification of Diabetes Mellitus,Diabetes  Care,2011,34(Suppl 1):S62-S69 and Standards of Medical Care in         Diabetes - 2011,Diabetes Care,2011,34  (Suppl 1):S11-S61.* 01/02/2011     Your HGBA1c can be lowered with medications, healthy diet, and exercise.  Check your blood sugar as directed by your physician  Call your physician if you experience unexplained or low blood sugars.  PHYSICAL ACTIVITY/REHABILITATION Goal is 30 minutes at least 4 days per week    {STROKE DC ACTIVITY/REHAB:22359}  Activity decreases your risk of heart attack and stroke and makes your heart stronger.  It helps control your weight and blood pressure; helps you relax and can improve your mood.  Participate in a regular exercise program.  Talk with your doctor about the best form of exercise for you (dancing, walking, swimming, cycling).  DIET/WEIGHT Goal is to maintain a healthy weight  Your discharge diet is: Carb Control *** liquids Your height is:  Height: 5\' 4"  (162.6 cm) Your current weight is: Weight: 72.53 kg (159 lb 14.4 oz) Your Body Mass Index (BMI) is:  BMI (Calculated): 25.6   Following the  type of diet specifically designed for you will help prevent another stroke.  Your goal weight range is:  ***  Your goal Body Mass Index (BMI) is 19-24.  Healthy food habits can help reduce 3 risk factors for stroke:  High cholesterol, hypertension, and excess weight.  RESOURCES Stroke/Support Group:  Call [phone]  they meet the 3rd Sunday of the month on the Rehab Unit at [facility], [place] ( no meetings June, July & Aug).  STROKE EDUCATION PROVIDED/REVIEWED AND GIVEN TO PATIENT Stroke warning signs and symptoms How to activate emergency medical system (call 911). Medications prescribed at discharge. Need for follow-up after discharge. Personal risk factors for stroke. Pneumonia vaccine given:   {STROKE DC YES/NO/DATE:22363} Flu vaccine given:   {STROKE DC YES/NO/DATE:22363} My questions have been answered, the writing is legible, and I  understand these instructions.  I will adhere to these goals & educational materials that have been provided to me after my discharge from the hospital.

## 2012-02-19 NOTE — Progress Notes (Signed)
  Echocardiogram 2D Echocardiogram has been performed.  [name] [name] A 02/19/2012, 10:28 AM

## 2012-02-19 NOTE — Discharge Summary (Signed)
DISCHARGE SUMMARY  [name] [name]  MR#: [id number]  DOB:July 28, 1936  Date of Admission: 02/18/2012 Date of Discharge: 02/19/2012  Attending Physician:Cylah Fannin  Patient's NWG:[id number] [name] Saucer, MD, MD  Consults:  Dr Roseanne Reno, neurology.  Discharge Diagnoses: Present on Admission:  .TIA (transient ischemic attack)   Hospital Course: [name] [name] was admitted on 02/18/12 with visual disturbances, as well as transient disorientation. The concern was for TIA CVA. Her work up included cta/ct brain/2decho, with findings of insignificant ICA stenosis but evidence of intracranial atherosclerotic disease, mild to moderate  Mitral regurg/moderate tricuspid regurg, and severely increased systolic pulmonary pressure(62 mm Hg). Likely, [name] [name] may be throwing plaques causing tias. 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 She says the symptoms have resolved. She was seen by Neurology in ED as code stroke, with recommendation for tia work up. I have increased [name] [name]'s aspirin to 325mg  daily, but she says this upsets her stomach. I have encouraged her to take her pepcid routinely. She has hx of afib but was taken off coumadin by her cardiologist because of recurrent falls. There were no arrhythmias picked up on telemetry. She is eager to go home and follow with her PCP next week. She is at her baseline.   Medication List  As of 02/19/2012  2:10 PM   STOP taking these medications         aspirin EC 81 MG tablet         TAKE these medications         aspirin 325 MG tablet   Take 1 tablet (325 mg total) by mouth daily.      bisoprolol 10 MG tablet   Commonly known as: ZEBETA   Take 10 mg by mouth daily.      calcium carbonate 600 MG Tabs   Commonly known as: OS-CAL   Take 600 mg by mouth daily.      diltiazem 120 MG tablet   Commonly known as: CARDIZEM   Take 120 mg by mouth 2 (two) times daily.      famotidine 20 MG tablet   Commonly known as: PEPCID   Take 20 mg by mouth 2  (two) times daily as needed. For GERD      furosemide 40 MG tablet   Commonly known as: LASIX   Take 60 mg by mouth daily.      gabapentin 300 MG capsule   Commonly known as: NEURONTIN   Take 300-600 mg by mouth 4 (four) times daily. Takes 1 tablet in the am, 2 at lunch, 2 dinner and 2 at bedtime      HYDROcodone-acetaminophen 10-325 MG per tablet   Commonly known as: NORCO   Take 1 tablet by mouth every 6 (six) hours as needed. pain      LORazepam 0.5 MG tablet   Commonly known as: ATIVAN   Take 0.5 mg by mouth every 6 (six) hours as needed. For anxiety      losartan 100 MG tablet   Commonly known as: COZAAR   Take 1 tablet (100 mg total) by mouth daily.      magnesium oxide 400 MG tablet   Commonly known as: MAG-OX   Take 400 mg by mouth daily.      metFORMIN 500 MG tablet   Commonly known as: GLUCOPHAGE   Take 500 mg by mouth daily.      mirtazapine 30 MG tablet   Commonly known as: REMERON   Take 30 mg by  mouth at bedtime.      potassium chloride SA 20 MEQ tablet   Commonly known as: K-DUR,KLOR-CON   Take 20 mEq by mouth daily.      simvastatin 10 MG tablet   Commonly known as: ZOCOR   Take 1 tablet (10 mg total) by mouth at bedtime.      vitamin C 500 MG tablet   Commonly known as: ASCORBIC ACID   Take 500 mg by mouth daily.      Vitamin D 2000 UNITS Caps   Take 1 capsule by mouth daily.             Day of Discharge BP 159/71  Pulse 64  Temp(Src) 97.8 F (36.6 C) (Oral)  Resp 20  Ht 5\' 4"  (1.626 m)  Wt 72.53 kg (159 lb 14.4 oz)  BMI 27.45 kg/m2  SpO2 98%  Physical Exam: sitting up in chair, not in distress.  Results for orders placed during the hospital encounter of 02/18/12 (from the past 24 hour(s))  GLUCOSE, CAPILLARY     Status: Abnormal   Collection Time   02/18/12  3:57 PM      Component Value Range   Glucose-Capillary 142 (*) 70 - 99 (mg/dL)  PROTIME-INR     Status: Normal   Collection Time   02/18/12  4:08 PM      Component Value  Range   Prothrombin Time 15.1  11.6 - 15.2 (seconds)   INR 1.17  0.00 - 1.49   APTT     Status: Normal   Collection Time   02/18/12  4:08 PM      Component Value Range   aPTT 36  24 - 37 (seconds)  CBC     Status: Abnormal   Collection Time   02/18/12  4:08 PM      Component Value Range   WBC 6.3  4.0 - 10.5 (K/uL)   RBC 3.84 (*) 3.87 - 5.11 (MIL/uL)   Hemoglobin 12.7  12.0 - 15.0 (g/dL)   HCT 16.1  09.6 - 04.5 (%)   MCV 97.7  78.0 - 100.0 (fL)   MCH 33.1  26.0 - 34.0 (pg)   MCHC 33.9  30.0 - 36.0 (g/dL)   RDW 40.9  81.1 - 91.4 (%)   Platelets 212  150 - 400 (K/uL)  DIFFERENTIAL     Status: Normal   Collection Time   02/18/12  4:08 PM      Component Value Range   Neutrophils Relative 62  43 - 77 (%)   Lymphocytes Relative 28  12 - 46 (%)   Monocytes Relative 5  3 - 12 (%)   Eosinophils Relative 4  0 - 5 (%)   Basophils Relative 1  0 - 1 (%)   Neutro Abs 3.8  1.7 - 7.7 (K/uL)   Lymphs Abs 1.8  0.7 - 4.0 (K/uL)   Monocytes Absolute 0.3  0.1 - 1.0 (K/uL)   Eosinophils Absolute 0.3  0.0 - 0.7 (K/uL)   Basophils Absolute 0.1  0.0 - 0.1 (K/uL)   WBC Morphology ATYPICAL LYMPHOCYTES    COMPREHENSIVE METABOLIC PANEL     Status: Abnormal   Collection Time   02/18/12  4:08 PM      Component Value Range   Sodium 138  135 - 145 (mEq/L)   Potassium 3.2 (*) 3.5 - 5.1 (mEq/L)   Chloride 100  96 - 112 (mEq/L)   CO2 25  19 - 32 (mEq/L)   Glucose,  Bld 130 (*) 70 - 99 (mg/dL)   BUN 17  6 - 23 (mg/dL)   Creatinine, Ser 2.13  0.50 - 1.10 (mg/dL)   Calcium 9.7  8.4 - 08.6 (mg/dL)   Total Protein 7.3  6.0 - 8.3 (g/dL)   Albumin 3.9  3.5 - 5.2 (g/dL)   AST 31  0 - 37 (U/L)   ALT 10  0 - 35 (U/L)   Alkaline Phosphatase 91  39 - 117 (U/L)   Total Bilirubin 1.1  0.3 - 1.2 (mg/dL)   GFR calc non Af Amer 61 (*) >90 (mL/min)   GFR calc Af Amer 71 (*) >90 (mL/min)  CK TOTAL AND CKMB     Status: Abnormal   Collection Time   02/18/12  4:08 PM      Component Value Range   Total CK 122  7 - 177 (U/L)     CK, MB 3.3  0.3 - 4.0 (ng/mL)   Relative Index 2.7 (*) 0.0 - 2.5   TROPONIN I     Status: Normal   Collection Time   02/18/12  4:08 PM      Component Value Range   Troponin I <0.30  <0.30 (ng/mL)  POCT I-STAT, CHEM 8     Status: Abnormal   Collection Time   02/18/12  4:30 PM      Component Value Range   Sodium 141  135 - 145 (mEq/L)   Potassium 3.3 (*) 3.5 - 5.1 (mEq/L)   Chloride 104  96 - 112 (mEq/L)   BUN 18  6 - 23 (mg/dL)   Creatinine, Ser 5.78  0.50 - 1.10 (mg/dL)   Glucose, Bld 469 (*) 70 - 99 (mg/dL)   Calcium, Ion 6.29  5.28 - 1.32 (mmol/L)   TCO2 25  0 - 100 (mmol/L)   Hemoglobin 13.3  12.0 - 15.0 (g/dL)   HCT 41.3  24.4 - 01.0 (%)  GLUCOSE, CAPILLARY     Status: Normal   Collection Time   02/18/12  4:46 PM      Component Value Range   Glucose-Capillary 79  70 - 99 (mg/dL)  GLUCOSE, CAPILLARY     Status: Normal   Collection Time   02/18/12  5:15 PM      Component Value Range   Glucose-Capillary 71  70 - 99 (mg/dL)  CBC     Status: Normal   Collection Time   02/18/12 11:30 PM      Component Value Range   WBC 6.7  4.0 - 10.5 (K/uL)   RBC 3.94  3.87 - 5.11 (MIL/uL)   Hemoglobin 12.8  12.0 - 15.0 (g/dL)   HCT 27.2  53.6 - 64.4 (%)   MCV 98.7  78.0 - 100.0 (fL)   MCH 32.5  26.0 - 34.0 (pg)   MCHC 32.9  30.0 - 36.0 (g/dL)   RDW 03.4  74.2 - 59.5 (%)   Platelets 194  150 - 400 (K/uL)  CREATININE, SERUM     Status: Abnormal   Collection Time   02/18/12 11:30 PM      Component Value Range   Creatinine, Ser 0.81  0.50 - 1.10 (mg/dL)   GFR calc non Af Amer 70 (*) >90 (mL/min)   GFR calc Af Amer 82 (*) >90 (mL/min)  LIPID PANEL     Status: Normal   Collection Time   02/19/12  6:50 AM      Component Value Range   Cholesterol 146  0 -  200 (mg/dL)   Triglycerides 95  <409 (mg/dL)   HDL 41  >81 (mg/dL)   Total CHOL/HDL Ratio 3.6     VLDL 19  0 - 40 (mg/dL)   LDL Cholesterol 86  0 - 99 (mg/dL)  GLUCOSE, CAPILLARY     Status: Abnormal   Collection Time   02/19/12  6:56 AM       Component Value Range   Glucose-Capillary 134 (*) 70 - 99 (mg/dL)  URINE RAPID DRUG SCREEN (HOSP PERFORMED)     Status: Abnormal   Collection Time   02/19/12  7:40 AM      Component Value Range   Opiates POSITIVE (*) NONE DETECTED    Cocaine NONE DETECTED  NONE DETECTED    Benzodiazepines NONE DETECTED  NONE DETECTED    Amphetamines NONE DETECTED  NONE DETECTED    Tetrahydrocannabinol NONE DETECTED  NONE DETECTED    Barbiturates NONE DETECTED  NONE DETECTED   GLUCOSE, CAPILLARY     Status: Abnormal   Collection Time   02/19/12 11:25 AM      Component Value Range   Glucose-Capillary 114 (*) 70 - 99 (mg/dL)    Disposition: home today.   Follow-up Appts: Discharge Orders    Future Appointments: Provider: Department: Dept Phone: Center:   03/05/2012 11:30 AM [facility] [phone] [facility]   03/10/2012 11:15 AM Cassell Clement, MD [facility] [phone] None   03/31/2012 11:30 AM Duke Salvia, MD [facility] [phone] [facility]     Future Orders Please Complete By Expires   Diet - low sodium heart healthy      Increase activity slowly         Follow-up Information    Follow up with Benita Stabile, MD in 1 week.          Time spent in discharge (includes decision making & examination of pt): 25 minutes  Signed: Zynia Wojtowicz 02/19/2012, 2:10 PM

## 2012-02-24 ENCOUNTER — Encounter: Payer: Self-pay | Admitting: Cardiology

## 2012-03-02 ENCOUNTER — Telehealth: Payer: Self-pay | Admitting: Cardiology

## 2012-03-02 MED ORDER — FUROSEMIDE 40 MG PO TABS: ORAL_TABLET | ORAL | Status: DC

## 2012-03-02 NOTE — Telephone Encounter (Signed)
Will forward to  Dr. Brackbill  

## 2012-03-02 NOTE — Telephone Encounter (Signed)
Advised of increase of lasix and to increase her potassium intake with foods.  Will forward to  Dr. Patty Sermons to see if he wants to make any changes in her potassium pill  Cancelled labs scheduled for office visit next week since she was recently in hospital, keep appointment with  Dr. Patty Sermons on 4/30.

## 2012-03-02 NOTE — Telephone Encounter (Signed)
Fu call Pt called back to tell you that she also had mini stroke and was in hospital.

## 2012-03-02 NOTE — Telephone Encounter (Signed)
Unable to walk from bedroom down to bathroom and she gets short of breath   Does get daily weights and it is fluctuating about 1 pound.  States her legs feel weak.  Taking lasix 60 mg daily.  Takes lasix 60 mg daily. Will forward to  Dr. Patty Sermons for review

## 2012-03-02 NOTE — Telephone Encounter (Signed)
Increase Lasix to 80 mg daily.  See in the office soon if she fails to improve and we should also get a basal metabolic panel if or when she needs to be seen

## 2012-03-02 NOTE — Telephone Encounter (Signed)
Increase Kdur  20 to BID  Her last K was 3.3.

## 2012-03-02 NOTE — Telephone Encounter (Signed)
Advised patient of potassium increase 

## 2012-03-02 NOTE — Telephone Encounter (Signed)
New Problem:     Patient called in because she has had a lot of trouble breathing this weekend and said that her heart failure has been really bad this weekend. Patient has also been taking 60mg  of her furosemide (LASIX) 40 MG tablet.  Please call back.

## 2012-03-05 ENCOUNTER — Other Ambulatory Visit: Payer: Medicare Other

## 2012-03-06 ENCOUNTER — Telehealth: Payer: Self-pay | Admitting: Cardiology

## 2012-03-06 NOTE — Telephone Encounter (Signed)
[place]-RN w/[employer] [phone] ext [phone], no return call necessary, fax to be sent   Patient c/o edima, SOB over the last several days. Nurse Vevelyn Francois reports patient seems to be very winded.

## 2012-03-06 NOTE — Telephone Encounter (Signed)
I agree with advice given 

## 2012-03-06 NOTE — Telephone Encounter (Signed)
Patient thinks shortness of breath is some better with this increase in lasix to 80 mg daily but she is feeling weak and running to restroom is making her feel worn out.  Advised to alternate lasix 80 mg with 60 mg starting tomorrow (already had 80 mg today) and to go to emergency department if no better.  Patient will keep her appointment with  Dr. Patty Sermons on 4/30  Stated she is having a lot of depression, will call her psychiatrist first thing Monday am to try to get appointment ASAP

## 2012-03-06 NOTE — Telephone Encounter (Signed)
Tried to call patient 3 x, first 2 x sounded like someone picked up and hung up, last time no answer.  Will try again later

## 2012-03-10 ENCOUNTER — Encounter: Payer: Self-pay | Admitting: Cardiology

## 2012-03-10 ENCOUNTER — Ambulatory Visit (INDEPENDENT_AMBULATORY_CARE_PROVIDER_SITE_OTHER): Payer: Medicare Other | Admitting: Cardiology

## 2012-03-10 VITALS — BP 124/66 | HR 65 | Ht 64.0 in | Wt 144.0 lb

## 2012-03-10 DIAGNOSIS — R0602 Shortness of breath: Secondary | ICD-10-CM

## 2012-03-10 DIAGNOSIS — I421 Obstructive hypertrophic cardiomyopathy: Secondary | ICD-10-CM

## 2012-03-10 DIAGNOSIS — I119 Hypertensive heart disease without heart failure: Secondary | ICD-10-CM

## 2012-03-10 DIAGNOSIS — G459 Transient cerebral ischemic attack, unspecified: Secondary | ICD-10-CM

## 2012-03-10 DIAGNOSIS — I509 Heart failure, unspecified: Secondary | ICD-10-CM

## 2012-03-10 DIAGNOSIS — I10 Essential (primary) hypertension: Secondary | ICD-10-CM

## 2012-03-10 DIAGNOSIS — E7800 Pure hypercholesterolemia, unspecified: Secondary | ICD-10-CM

## 2012-03-10 DIAGNOSIS — I4891 Unspecified atrial fibrillation: Secondary | ICD-10-CM

## 2012-03-10 NOTE — Assessment & Plan Note (Signed)
The patient has a history of hypercholesterolemia and is on low-dose simvastatin.  She has not had any myalgias from the simvastatin.  She does have neuropathy in the bottom of her feet and is overdue to see her neurologist again.  She is on gabapentin for this

## 2012-03-10 NOTE — Progress Notes (Signed)
[name] [name] Date of Birth:  1936-05-10 [facility] [address] [place], [place]  [address] [phone]         Fax   [phone]  History of Present Illness: This pleasant 76 year old woman is seen for a scheduled followup office visit.  She has a history of IHSS and diastolic heart failure.  She also has a history of chronic established atrial fibrillation and she has a functioning ventricular pacemaker.  She has had a history of falls and is not on Coumadin because of a fall risk.  She has a history of severe pulmonary disease with pulmonary hypertension and dyspnea and is on round-the-clock oxygen.  She uses 2 L per minute during the day and 1 L per minute at night.  Since we last saw her she was hospitalized on April 9 for a TIA.  She was discharged on her same medications.  She was sent home on aspirin 325 mg daily instead of 81 mg daily but she has not been able to tolerate the higher dose because of dyspepsia. Current Outpatient Prescriptions  Medication Sig Dispense Refill  . Ascorbic Acid (VITAMIN C) 500 MG tablet Take 500 mg by mouth daily.        [name] aspirin 81 MG tablet Take 81 mg by mouth daily.      . bisoprolol (ZEBETA) 10 MG tablet Take 10 mg by mouth daily.       . calcium carbonate (OS-CAL) 600 MG TABS Take 600 mg by mouth daily.        . Cholecalciferol (VITAMIN D) 2000 UNITS CAPS Take 1 capsule by mouth daily.        . clindamycin (CLEOCIN) 150 MG capsule Take 150 mg by mouth as directed. Before dentist appointments       . diltiazem (CARDIZEM CD) 120 MG 24 hr capsule Take 120 mg by mouth 2 (two) times daily.       . famotidine (PEPCID) 20 MG tablet Take 20 mg by mouth 2 (two) times daily as needed. For GERD      . furosemide (LASIX) 40 MG tablet Take 80 mg alternating with 60 mg      . gabapentin (NEURONTIN) 300 MG capsule Take 300-600 mg by mouth 4 (four) times daily. Takes 1 tablet in the am, 2 at lunch, 2 dinner and 2 at bedtime      .  HYDROcodone-acetaminophen (NORCO) 10-325 MG per tablet Take 1 tablet by mouth every 6 (six) hours as needed. pain      . LORazepam (ATIVAN) 0.5 MG tablet Take 0.5 mg by mouth every 6 (six) hours as needed. For anxiety      . losartan (COZAAR) 100 MG tablet Take 1 tablet (100 mg total) by mouth daily.  30 tablet  11  . magnesium oxide (MAG-OX) 400 MG tablet Take 400 mg by mouth daily.       . metFORMIN (GLUCOPHAGE) 500 MG tablet Take 500 mg by mouth daily.       . mirtazapine (REMERON) 30 MG tablet Take 30 mg by mouth at bedtime.      . potassium chloride SA (K-DUR,KLOR-CON) 20 MEQ tablet Take 20 mEq by mouth 2 (two) times daily.       [name] PROAIR HFA 108 (90 BASE) MCG/ACT inhaler Inhale into the lungs as directed.       . simvastatin (ZOCOR) 10 MG tablet Take 1 tablet (10 mg total) by mouth at bedtime.  90 tablet  3  . DISCONTD: furosemide (LASIX) 40 MG tablet Take 2 tablets daily  60 tablet  5  . DISCONTD: sertraline (ZOLOFT) 100 MG tablet Take 100 mg by mouth daily.        Allergies  Allergen Reactions  . Calan (Verapamil Hcl) Other (See Comments)    Patient states it makes her "out of her mind"  . Crestor (Rosuvastatin Calcium) Other (See Comments)    Muscle pain  . Escitalopram Oxalate Other (See Comments)    Unknown   . Lipitor (Atorvastatin Calcium) Other (See Comments)    myalgias  . Lopressor (Metoprolol Tartrate) Other (See Comments)    Blood pressure bottoms out   . Norvasc (Amlodipine Besylate) Other (See Comments)    fatigue  . Penicillins Hives and Rash    Patient Active Problem List  Diagnoses  . HYPERLIPIDEMIA  . DEPRESSIVE DISORDER NOT ELSEWHERE CLASSIFIED  . HYPERTENSION  . ALLERGIC RHINITIS  . CHRONIC OBSTRUCTIVE PULMONARY DISEASE, MODERATE  . SHORTNESS OF BREATH  . Cough  . Atrial fibrillation  . Pacemaker  . Hypertrophic cardiomyopathy  . Osteoarthritis  . Situational mixed anxiety and depressive disorder  . Hypercholesterolemia  . Mitral regurgitation    . Fall  . Diabetes mellitus  . Chest pain at rest  . TIA (transient ischemic attack)    History  Smoking status  . Former Smoker  . Types: Cigarettes  . Quit date: 11/15/2005  Smokeless tobacco  . Never Used    History  Alcohol Use No    Family History  Problem Relation Age of Onset  . Other Father     died in plane crash  . Cancer Mother     lymphoma-NHL  . Coronary artery disease Grandchild     Review of Systems: Constitutional: no fever chills diaphoresis or fatigue or change in weight.  Head and neck: no hearing loss, no epistaxis, no photophobia or visual disturbance. Respiratory: No cough, shortness of breath or wheezing. Cardiovascular: No chest pain peripheral edema, palpitations. Gastrointestinal: No abdominal distention, no abdominal pain, no change in bowel habits hematochezia or melena. Genitourinary: No dysuria, no frequency, no urgency, no nocturia. Musculoskeletal:No arthralgias, no back pain, no gait disturbance or myalgias. Neurological: No dizziness, no headaches, no numbness, no seizures, no syncope, no weakness, no tremors. Hematologic: No lymphadenopathy, no easy bruising. Psychiatric: No confusion, no hallucinations, no sleep disturbance.    Physical Exam: Filed Vitals:   03/10/12 1140  BP: 124/66  Pulse: 65   the general appearance reveals a well-developed well-nourished elderly woman in no acute distress.  She is wearing nasal oxygen at 2 L a minute.Pupils equal and reactive.   Extraocular Movements are full.  There is no scleral icterus.  The mouth and pharynx are normal.  The neck is supple.  The carotids reveal no bruits.  The jugular venous pressure is normal.  The thyroid is not enlarged.  There is no lymphadenopathy.  The chest is clear to percussion and auscultation. There are no rales or rhonchi. Expansion of the chest is symmetrical.  The heart reveals a grade 2/6 systolic ejection murmur at the base.The abdomen is soft and  nontender. Bowel sounds are normal. The liver and spleen are not enlarged. There Are no abdominal masses. There are no bruits.  Extremities show no pitting edema.  Pedal pulses are present. EKG shows atrial fibrillation with a ventricular paced rhythm   Assessment / Plan: Continue on same medication.  She will continue on aspirin 81 mg daily.  She'll be rechecked in 3 months for a followup office visit and fasting lab work.

## 2012-03-10 NOTE — Assessment & Plan Note (Signed)
The patient has a history of essential hypertension.  She has not been experiencing any severe dizzy spells or syncope.

## 2012-03-10 NOTE — Patient Instructions (Signed)
Decrease your Aspirin to 81 mg daily  Your physician recommends that you schedule a follow-up appointment in: 3 months with fasting labs (LP/BMET/HFP)

## 2012-03-10 NOTE — Assessment & Plan Note (Signed)
EKG today confirms persistent chronic atrial fibrillation underlying her paced ventricular rhythm.  She has had no further TIA symptoms since April 9.

## 2012-03-10 NOTE — Assessment & Plan Note (Signed)
The patient has a history of 2 pillow orthopnea.  Has exertional dyspnea which is unchanged.  She was feeling weak at a dose of Lasix of 80 mg daily and is doing better with a slight reduction and she alternates 80 mg one day with 60 mg the next

## 2012-03-23 ENCOUNTER — Telehealth: Payer: Self-pay | Admitting: Cardiology

## 2012-03-23 NOTE — Telephone Encounter (Signed)
New Problem:     Patient called in complaining of low energy and sleepiness, low BP levels, is itching all over and she just doesn't feel right. Please call back.

## 2012-03-23 NOTE — Telephone Encounter (Signed)
Agree with advice given

## 2012-03-23 NOTE — Telephone Encounter (Signed)
Took fluid pill this am and feels funny.  Blood sugars seem to be all over the place and itching all over.  Patient see Dr Clovis Riley at [facility] and they have after hours clinic.  Advised patient with all that is going on she should go there to be evaluated.  Verbalized understanding

## 2012-03-25 IMAGING — CR DG CHEST 2V
2 series · 2 of 2 positions shown · non-contrast
Comparison: CT and radiographs 07/05/2010.

CLINICAL DATA: Shortness of breath and mid chest pain.

CHEST - 2 VIEW

[view not recorded (1 of 2)]
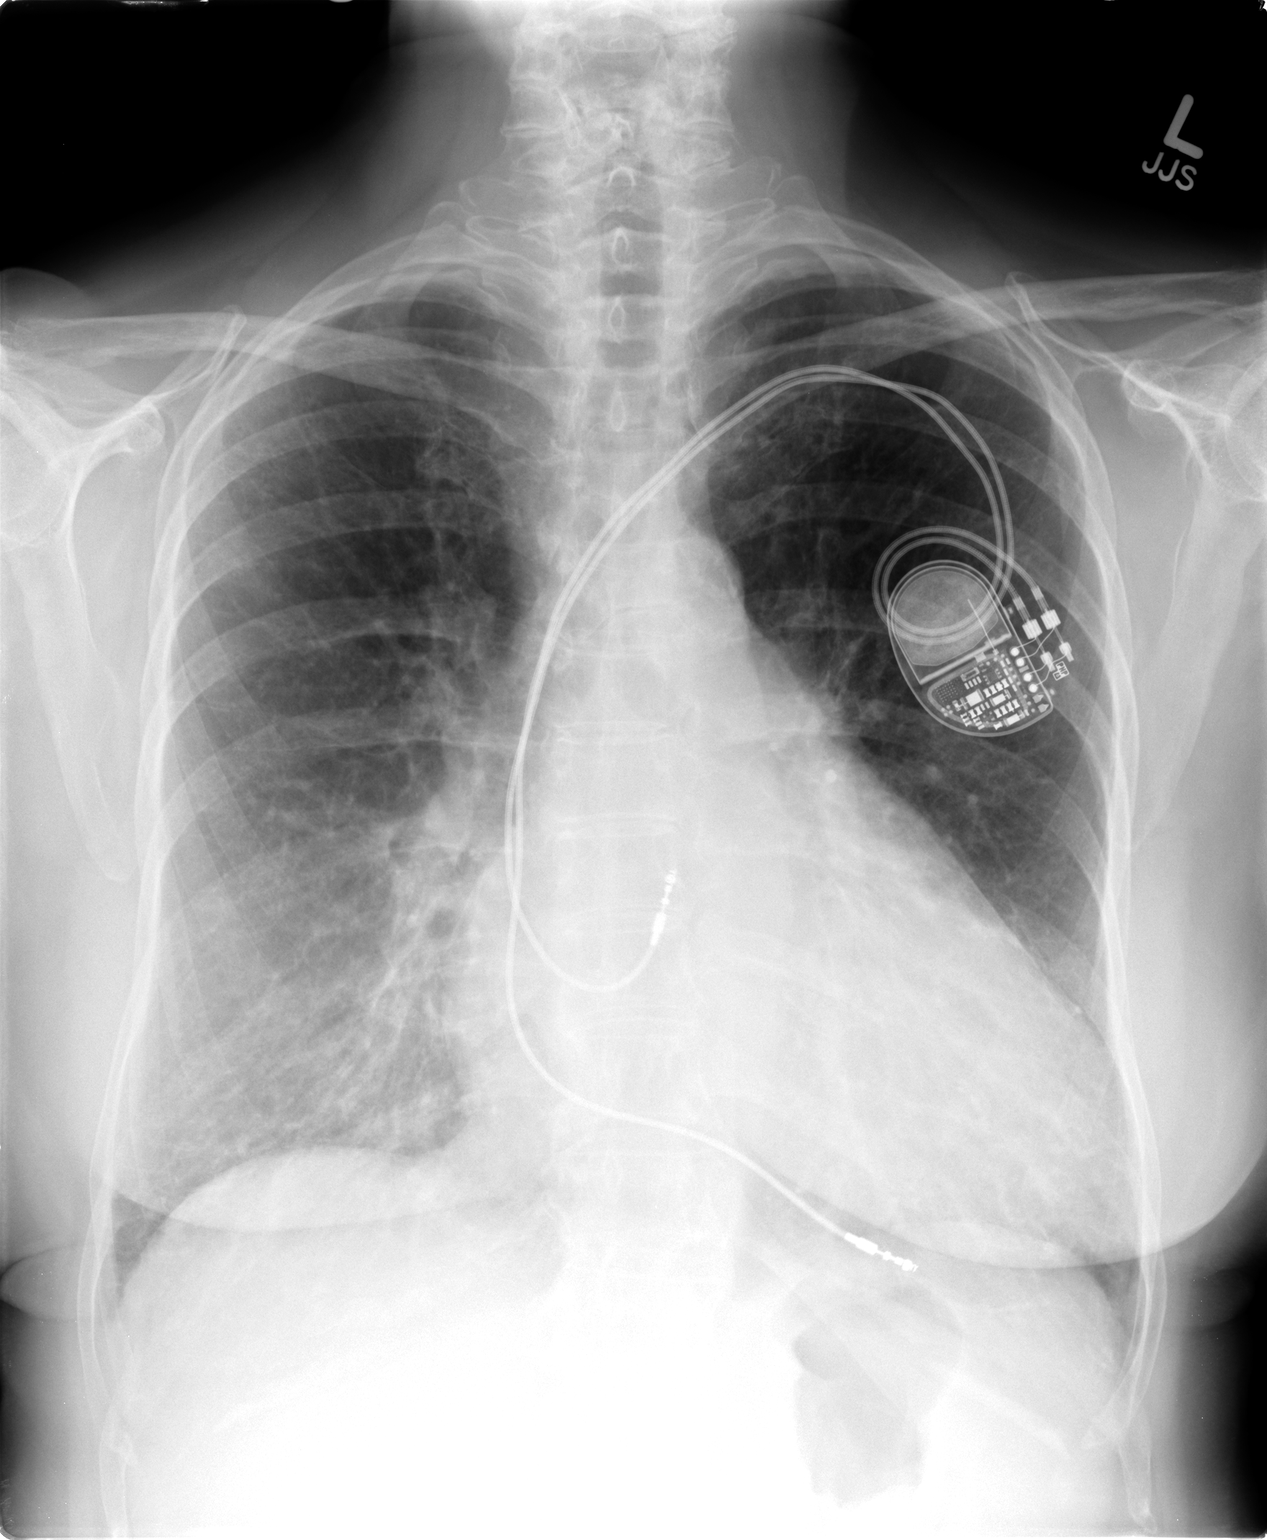

[view not recorded (2 of 2)]
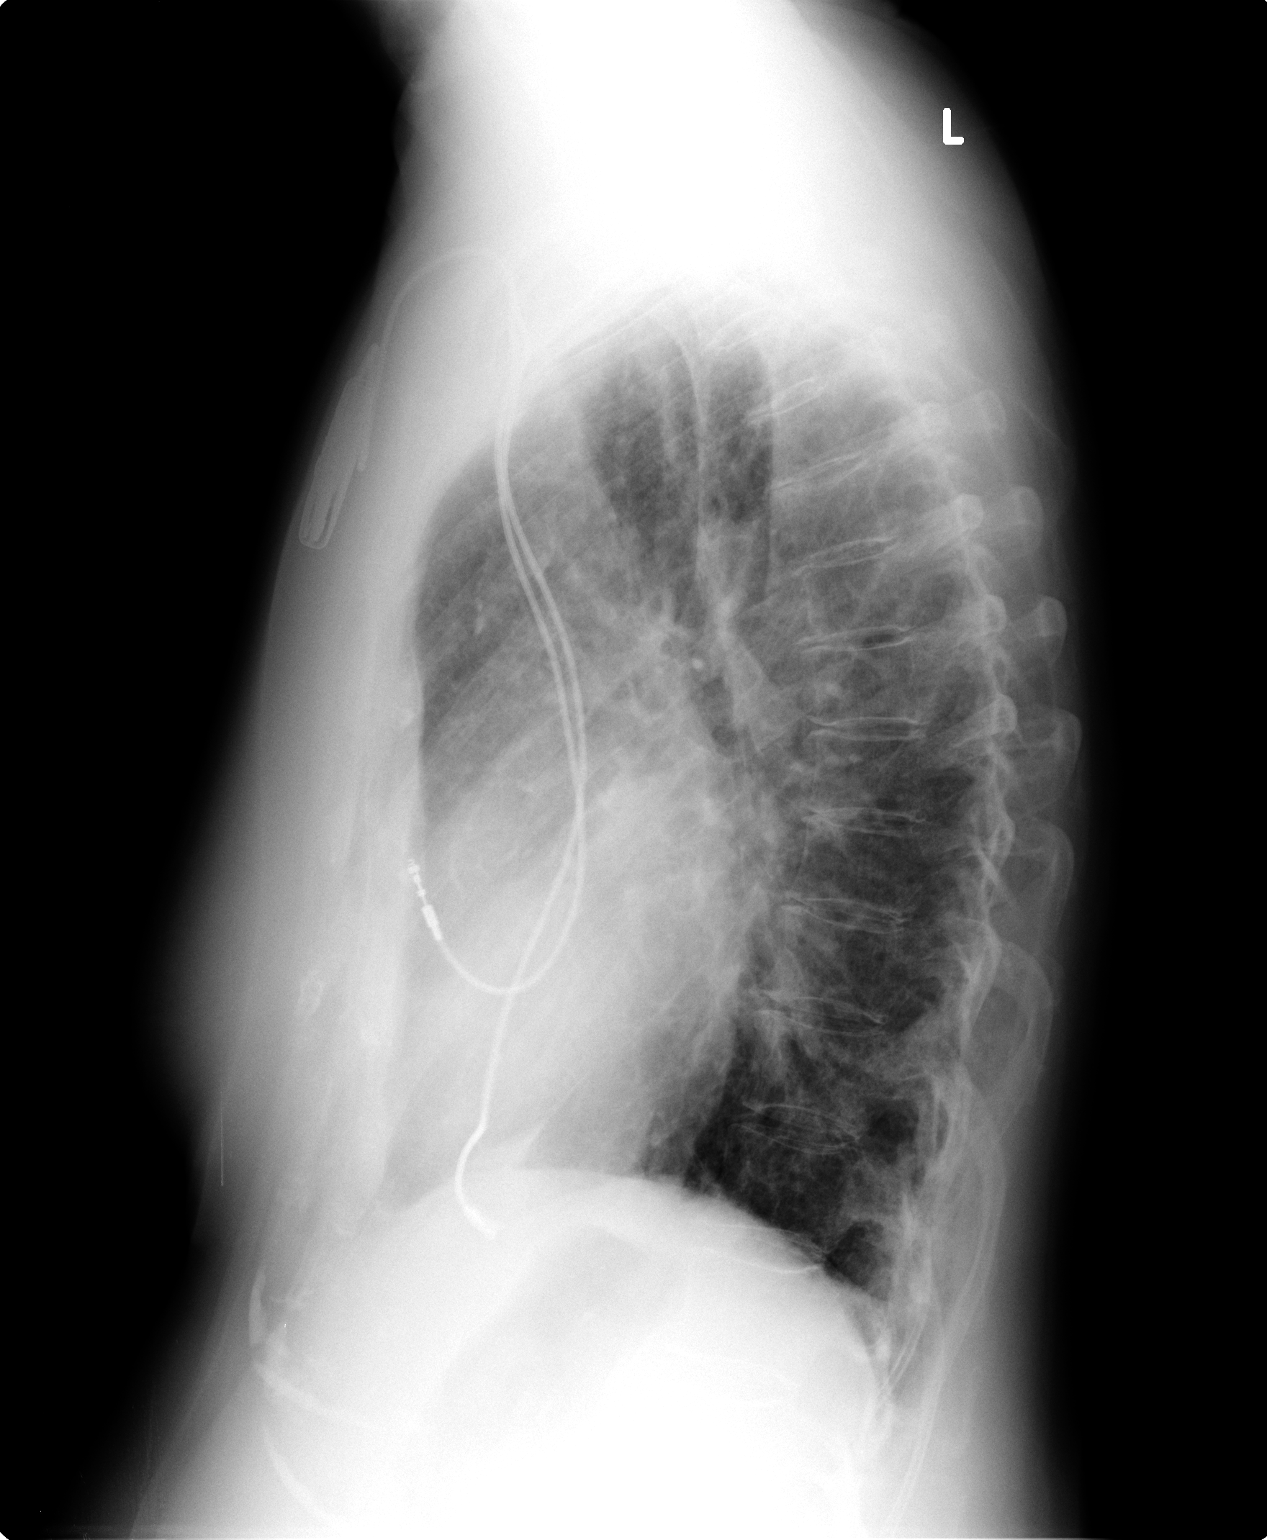

[2 of 2 positions shown; findings below may reference images not displayed]

FINDINGS: Left subclavian AV sequential pacemaker leads appear
unchanged.  There is stable cardiomegaly with chronic vascular
congestion and generalized interstitial prominence.  No overt
pulmonary edema or confluent airspace opacity is demonstrated.
There is no pleural effusion.  Osseous structures appear unchanged.
IMPRESSION: Stable examination with chronic interstitial prominence.  No
definite acute findings.

## 2012-03-30 ENCOUNTER — Other Ambulatory Visit: Payer: Self-pay | Admitting: Cardiology

## 2012-03-30 NOTE — Telephone Encounter (Signed)
Refilled losartan 

## 2012-03-31 ENCOUNTER — Encounter: Payer: Self-pay | Admitting: Internal Medicine

## 2012-03-31 ENCOUNTER — Encounter: Payer: Self-pay | Admitting: *Deleted

## 2012-03-31 ENCOUNTER — Ambulatory Visit (INDEPENDENT_AMBULATORY_CARE_PROVIDER_SITE_OTHER): Payer: Medicare Other | Admitting: Internal Medicine

## 2012-03-31 VITALS — BP 122/59 | HR 65 | Ht 64.0 in | Wt 145.1 lb

## 2012-03-31 DIAGNOSIS — I4891 Unspecified atrial fibrillation: Secondary | ICD-10-CM

## 2012-03-31 DIAGNOSIS — I422 Other hypertrophic cardiomyopathy: Secondary | ICD-10-CM

## 2012-03-31 DIAGNOSIS — Z950 Presence of cardiac pacemaker: Secondary | ICD-10-CM

## 2012-03-31 LAB — PACEMAKER DEVICE OBSERVATION
AL AMPLITUDE: 0.739 mv
AL IMPEDENCE PM: 308 Ohm
RV LEAD IMPEDENCE PM: 384 Ohm
RV LEAD THRESHOLD: 1.5 V
VENTRICULAR PACING PM: 64.24

## 2012-03-31 NOTE — Assessment & Plan Note (Signed)
Her device has reached ERI. She wants to attend her granddaughter's wedding  and as such she would like to defer a figure generator replacement until thereafter. That'll be right at 3 months. I think she will be fine as she paces only 65% of the time.  We have discussed risks including but not limited to infection and lead malfunction.. Her x-ray was reviewed.

## 2012-03-31 NOTE — Progress Notes (Signed)
HPI  [name] [name] is a 75 y.o. female Seen to establish pacemaker followup. She initially underwent device implantation in 1998. She underwent generator replacement in 2006 receiving a Medtronic and a device. Using the previously implanted leads.  She has permanent atrial fibrillation. She is on warfarin. She has a history of a prior TIA. She does not take Coumadin because of multiple fall. She is on aspirin..  Her device has reached ERI.  She has chronic oxygen-dependent COPD and for what I gather is thought to have pulmonary hypertension.\\  Or other complaints related to the bottoms of her feet and waking up sometimes in the morning with her hands numb.  Past Medical History  Diagnosis Date  . Hypertrophic cardiomyopathy   . Hypertension   . Osteoarthritis   . Situational mixed anxiety and depressive disorder   . Hypercholesterolemia   . Atrial fibrillation     Chronic; No longer on coumadin  . Pacemaker   . Falls frequently     coumadin stopped  . Pulmonary HTN     has had prior right heart cath in 2010; was felt that most likely due to elevated left sided pressures and would not benefit from vasodilator therapy  . Diabetes mellitus   . Pacemaker   . Oxygen dependent   . COPD (chronic obstructive pulmonary disease)   . TIA (transient ischemic attack)     Past Surgical History  Procedure Date  . Knee arthroplasty 2011    right knee  . Tonsillectomy   . Appendectomy   . Cardiac catheterization   . Insert / replace / remove pacemaker   . Other surgical history     hysterectomy-bening    Current Outpatient Prescriptions  Medication Sig Dispense Refill  . Ascorbic Acid (VITAMIN C) 500 MG tablet Take 500 mg by mouth daily.       . aspirin 81 MG tablet Take 81 mg by mouth daily.      . bisoprolol (ZEBETA) 10 MG tablet Take 10 mg by mouth daily.       . calcium carbonate (OS-CAL) 600 MG TABS Take 600 mg by mouth daily.        . Cholecalciferol (VITAMIN D) 2000 UNITS  CAPS Take 1 capsule by mouth daily.        . clindamycin (CLEOCIN) 150 MG capsule Take 150 mg by mouth as directed. Before dentist appointments       . diltiazem (CARDIZEM CD) 120 MG 24 hr capsule Take 120 mg by mouth 2 (two) times daily.       . famotidine (PEPCID) 20 MG tablet Take 20 mg by mouth 2 (two) times daily as needed. For GERD      . furosemide (LASIX) 40 MG tablet Take 80 mg alternating with 60 mg      . gabapentin (NEURONTIN) 300 MG capsule Take 300-600 mg by mouth 4 (four) times daily. Takes 1 tablet in the am, 2 at lunch, 2 dinner and 2 at bedtime      . HYDROcodone-acetaminophen (NORCO) 10-325 MG per tablet Take 1 tablet by mouth every 6 (six) hours as needed. pain      . LORazepam (ATIVAN) 0.5 MG tablet Take 0.5 mg by mouth every 6 (six) hours as needed. For anxiety      . losartan (COZAAR) 100 MG tablet TAKE 1 TABLET BY MOUTH EVERY DAY  30 tablet  11  . magnesium oxide (MAG-OX) 400 MG tablet Take 400 mg by mouth daily.       .   metFORMIN (GLUCOPHAGE) 500 MG tablet Take 500 mg by mouth daily.       . mirtazapine (REMERON) 30 MG tablet Take 30 mg by mouth at bedtime.      . potassium chloride SA (K-DUR,KLOR-CON) 20 MEQ tablet Take 20 mEq by mouth 2 (two) times daily.       . PROAIR HFA 108 (90 BASE) MCG/ACT inhaler Inhale into the lungs as directed.       . simvastatin (ZOCOR) 10 MG tablet Take 1 tablet (10 mg total) by mouth at bedtime.  90 tablet  3  . sulfamethoxazole-trimethoprim (BACTRIM DS) 800-160 MG per tablet Take by mouth as directed.       . DISCONTD: sertraline (ZOLOFT) 100 MG tablet Take 100 mg by mouth daily.        Allergies  Allergen Reactions  . Calan (Verapamil Hcl) Other (See Comments)    Patient states it makes her "out of her mind"  . Crestor (Rosuvastatin Calcium) Other (See Comments)    Muscle pain  . Escitalopram Oxalate Other (See Comments)    Unknown   . Lipitor (Atorvastatin Calcium) Other (See Comments)    myalgias  . Lopressor (Metoprolol  Tartrate) Other (See Comments)    Blood pressure bottoms out   . Norvasc (Amlodipine Besylate) Other (See Comments)    fatigue  . Penicillins Hives and Rash    Review of Systems negative except from HPI and PMH  Physical Exam BP 122/59  Pulse 65  Ht 5' 4" (1.626 m)  Wt 145 lb 1.9 oz (65.826 kg)  BMI 24.91 kg/m2 Well developed and well nourished wearing oxygen in no acute distress HENT normal E scleral and icterus clear Neck Supple JVP 9-10 carotids brisk and full Clear to ausculation Regular rate and rhythm, no murmurs gallops or rub Soft with active bowel sounds No clubbing cyanosis Trace Edema Alert and oriented, grossly normal motor and sensory function Skin Warm and Dry She thenar weakness   Assessment and  Plan   

## 2012-03-31 NOTE — Assessment & Plan Note (Signed)
We'll need to look into the genetics of this for the family basal septal hypertrophy.

## 2012-03-31 NOTE — Assessment & Plan Note (Signed)
Permanent not on anticoagulation. The fact that she is on aspirin however, makes me wonder whether she isn't a candidate for apixoban And our review this with Dr. Patty Sermons

## 2012-03-31 NOTE — Patient Instructions (Signed)
Your physician has recommended that you have a pacemaker generator change. Please see the instruction sheet given to you today for more information.    

## 2012-04-10 ENCOUNTER — Encounter (HOSPITAL_COMMUNITY): Payer: Self-pay

## 2012-04-15 ENCOUNTER — Other Ambulatory Visit: Payer: Self-pay | Admitting: Internal Medicine

## 2012-04-15 DIAGNOSIS — Z950 Presence of cardiac pacemaker: Secondary | ICD-10-CM

## 2012-04-16 ENCOUNTER — Other Ambulatory Visit (INDEPENDENT_AMBULATORY_CARE_PROVIDER_SITE_OTHER): Payer: Medicare Other

## 2012-04-16 ENCOUNTER — Other Ambulatory Visit: Payer: Self-pay | Admitting: Cardiology

## 2012-04-16 DIAGNOSIS — I4891 Unspecified atrial fibrillation: Secondary | ICD-10-CM

## 2012-04-16 DIAGNOSIS — I422 Other hypertrophic cardiomyopathy: Secondary | ICD-10-CM

## 2012-04-16 LAB — PROTIME-INR
INR: 1.2 ratio — ABNORMAL HIGH (ref 0.8–1.0)
Prothrombin Time: 12.7 s — ABNORMAL HIGH (ref 10.2–12.4)

## 2012-04-16 LAB — CBC WITH DIFFERENTIAL/PLATELET
Basophils Relative: 0.5 % (ref 0.0–3.0)
Eosinophils Absolute: 0.2 10*3/uL (ref 0.0–0.7)
Eosinophils Relative: 3.4 % (ref 0.0–5.0)
Hemoglobin: 13.1 g/dL (ref 12.0–15.0)
Lymphocytes Relative: 13.8 % (ref 12.0–46.0)
MCHC: 33.6 g/dL (ref 30.0–36.0)
Monocytes Relative: 5.1 % (ref 3.0–12.0)
Neutro Abs: 5.7 10*3/uL (ref 1.4–7.7)
Neutrophils Relative %: 77.2 % — ABNORMAL HIGH (ref 43.0–77.0)
RBC: 3.92 Mil/uL (ref 3.87–5.11)
WBC: 7.4 10*3/uL (ref 4.5–10.5)

## 2012-04-16 LAB — BASIC METABOLIC PANEL
BUN: 20 mg/dL (ref 6–23)
CO2: 30 mEq/L (ref 19–32)
Chloride: 103 mEq/L (ref 96–112)
Creatinine, Ser: 0.9 mg/dL (ref 0.4–1.2)
Potassium: 3.5 mEq/L (ref 3.5–5.1)

## 2012-04-20 ENCOUNTER — Other Ambulatory Visit: Payer: Self-pay | Admitting: Internal Medicine

## 2012-04-23 MED ORDER — SODIUM CHLORIDE 0.9 % IR SOLN
80.0000 mg | Status: DC
  Filled 2012-04-23: qty 2

## 2012-04-23 MED ORDER — SODIUM CHLORIDE 0.45 % IV SOLN: INTRAVENOUS | Status: DC

## 2012-04-23 MED ORDER — SODIUM CHLORIDE 0.9 % IJ SOLN: 3.0000 mL | Freq: Two times a day (BID) | INTRAMUSCULAR | Status: DC

## 2012-04-23 MED ORDER — SODIUM CHLORIDE 0.9 % IJ SOLN: 3.0000 mL | INTRAMUSCULAR | Status: DC | PRN

## 2012-04-23 MED ORDER — CHLORHEXIDINE GLUCONATE 4 % EX LIQD
60.0000 mL | Freq: Once | CUTANEOUS | Status: DC
  Filled 2012-04-23: qty 60

## 2012-04-23 MED ORDER — SODIUM CHLORIDE 0.9 % IV SOLN: 250.0000 mL | INTRAVENOUS | Status: DC

## 2012-04-23 MED ORDER — VANCOMYCIN HCL IN DEXTROSE 1-5 GM/200ML-% IV SOLN
1000.0000 mg | INTRAVENOUS | Status: DC
  Filled 2012-04-23 (×2): qty 200

## 2012-04-24 ENCOUNTER — Ambulatory Visit (HOSPITAL_COMMUNITY)
Admission: RE | Admit: 2012-04-24 | Discharge: 2012-04-24 | Disposition: A | Discharge status: Home / Self Care | Payer: Medicare Other | Source: Ambulatory Visit | Attending: Internal Medicine | Admitting: Internal Medicine

## 2012-04-24 ENCOUNTER — Encounter (HOSPITAL_COMMUNITY)
Admission: RE | Disposition: A | Discharge status: Home / Self Care | Payer: Self-pay | Source: Ambulatory Visit | Attending: Internal Medicine

## 2012-04-24 DIAGNOSIS — Z45018 Encounter for adjustment and management of other part of cardiac pacemaker: Secondary | ICD-10-CM | POA: Insufficient documentation

## 2012-04-24 DIAGNOSIS — Z01812 Encounter for preprocedural laboratory examination: Secondary | ICD-10-CM | POA: Insufficient documentation

## 2012-04-24 DIAGNOSIS — I4891 Unspecified atrial fibrillation: Secondary | ICD-10-CM | POA: Insufficient documentation

## 2012-04-24 DIAGNOSIS — I422 Other hypertrophic cardiomyopathy: Secondary | ICD-10-CM | POA: Insufficient documentation

## 2012-04-24 DIAGNOSIS — Z8673 Personal history of transient ischemic attack (TIA), and cerebral infarction without residual deficits: Secondary | ICD-10-CM | POA: Insufficient documentation

## 2012-04-24 DIAGNOSIS — J449 Chronic obstructive pulmonary disease, unspecified: Secondary | ICD-10-CM | POA: Insufficient documentation

## 2012-04-24 DIAGNOSIS — J4489 Other specified chronic obstructive pulmonary disease: Secondary | ICD-10-CM | POA: Insufficient documentation

## 2012-04-24 DIAGNOSIS — Z950 Presence of cardiac pacemaker: Secondary | ICD-10-CM

## 2012-04-24 DIAGNOSIS — E119 Type 2 diabetes mellitus without complications: Secondary | ICD-10-CM | POA: Insufficient documentation

## 2012-04-24 DIAGNOSIS — I2789 Other specified pulmonary heart diseases: Secondary | ICD-10-CM | POA: Insufficient documentation

## 2012-04-24 DIAGNOSIS — I10 Essential (primary) hypertension: Secondary | ICD-10-CM | POA: Insufficient documentation

## 2012-04-24 DIAGNOSIS — E7800 Pure hypercholesterolemia, unspecified: Secondary | ICD-10-CM | POA: Insufficient documentation

## 2012-04-24 DIAGNOSIS — Z9981 Dependence on supplemental oxygen: Secondary | ICD-10-CM | POA: Insufficient documentation

## 2012-04-24 DIAGNOSIS — I498 Other specified cardiac arrhythmias: Secondary | ICD-10-CM

## 2012-04-24 HISTORY — PX: PACEMAKER GENERATOR CHANGE: SHX5481

## 2012-04-24 LAB — GLUCOSE, CAPILLARY
Glucose-Capillary: 144 mg/dL — ABNORMAL HIGH (ref 70–99)
Glucose-Capillary: 156 mg/dL — ABNORMAL HIGH (ref 70–99)

## 2012-04-24 LAB — SURGICAL PCR SCREEN: MRSA, PCR: NEGATIVE

## 2012-04-24 SURGERY — PACEMAKER GENERATOR CHANGE
Anesthesia: LOCAL

## 2012-04-24 MED ORDER — LIDOCAINE HCL (PF) 1 % IJ SOLN
INTRAMUSCULAR | Status: AC
  Filled 2012-04-24: qty 60

## 2012-04-24 MED ORDER — ACETAMINOPHEN 325 MG PO TABS
325.0000 mg | ORAL_TABLET | ORAL | Status: DC | PRN
  Administered 2012-04-24: 650 mg via ORAL

## 2012-04-24 MED ORDER — MUPIROCIN 2 % EX OINT
TOPICAL_OINTMENT | CUTANEOUS | Status: AC
  Filled 2012-04-24: qty 22

## 2012-04-24 MED ORDER — ACETAMINOPHEN 325 MG PO TABS
ORAL_TABLET | ORAL | Status: AC
  Filled 2012-04-24: qty 2

## 2012-04-24 MED ORDER — MUPIROCIN 2 % EX OINT: TOPICAL_OINTMENT | Freq: Two times a day (BID) | CUTANEOUS | Status: DC

## 2012-04-24 NOTE — CV Procedure (Signed)
Preoperative diagnosis bradycardia Postoperative diagnosis same Procedure: Generator replacement    Following informed consent the patient was brought to the electrophysiology laboratory in place of the fluoroscopic table in the supine position after routine prep and drape lidocaine was infiltrated in the region of the previous incision and carried down to later the device pocket using sharp dissection and electrocautery. The pocket was opened the device was freed up and was explanted.  Interrogation of the previously implanted ventricular lead St Jude [id number]  demonstrated an R wave of >30 millivolts., and impedance of 787ohms, and a pacing threshold of 1  volts at 0.5 msec.    The previously implanted atrial lead St Jude [id number] was not interogatted 2/2 chronic afib  The leads were inspected. The leads were then attached to a Medtronic sensia generator, serial number [id number].    The pocket was irrigated with antibiotic containing saline solution hemostasis was assured and the leads and the device were placed in the pocket. The wound is then closed in 3 layers in normal fashion.  The patient tolerated the procedure without apparent complication.  Sherryl Manges

## 2012-04-24 NOTE — H&P (View-Only) (Signed)
HPI  [name] [name] is a 76 y.o. female Seen to establish pacemaker followup. She initially underwent device implantation in 1998. She underwent generator replacement in 2006 receiving a Medtronic and a device. Using the previously implanted leads.  She has permanent atrial fibrillation. She is on warfarin. She has a history of a prior TIA. She does not take Coumadin because of multiple fall. She is on aspirin..  Her device has reached ERI.  She has chronic oxygen-dependent COPD and for what I gather is thought to have pulmonary hypertension.\\  Or other complaints related to the bottoms of her feet and waking up sometimes in the morning with her hands numb.  Past Medical History  Diagnosis Date  . Hypertrophic cardiomyopathy   . Hypertension   . Osteoarthritis   . Situational mixed anxiety and depressive disorder   . Hypercholesterolemia   . Atrial fibrillation     Chronic; No longer on coumadin  . Pacemaker   . Falls frequently     coumadin stopped  . Pulmonary HTN     has had prior right heart cath in 2010; was felt that most likely due to elevated left sided pressures and would not benefit from vasodilator therapy  . Diabetes mellitus   . Pacemaker   . Oxygen dependent   . COPD (chronic obstructive pulmonary disease)   . TIA (transient ischemic attack)     Past Surgical History  Procedure Date  . Knee arthroplasty 2011    right knee  . Tonsillectomy   . Appendectomy   . Cardiac catheterization   . Insert / replace / remove pacemaker   . Other surgical history     hysterectomy-bening    Current Outpatient Prescriptions  Medication Sig Dispense Refill  . Ascorbic Acid (VITAMIN C) 500 MG tablet Take 500 mg by mouth daily.       [name] aspirin 81 MG tablet Take 81 mg by mouth daily.      . bisoprolol (ZEBETA) 10 MG tablet Take 10 mg by mouth daily.       . calcium carbonate (OS-CAL) 600 MG TABS Take 600 mg by mouth daily.        . Cholecalciferol (VITAMIN D) 2000 UNITS  CAPS Take 1 capsule by mouth daily.        . clindamycin (CLEOCIN) 150 MG capsule Take 150 mg by mouth as directed. Before dentist appointments       . diltiazem (CARDIZEM CD) 120 MG 24 hr capsule Take 120 mg by mouth 2 (two) times daily.       . famotidine (PEPCID) 20 MG tablet Take 20 mg by mouth 2 (two) times daily as needed. For GERD      . furosemide (LASIX) 40 MG tablet Take 80 mg alternating with 60 mg      . gabapentin (NEURONTIN) 300 MG capsule Take 300-600 mg by mouth 4 (four) times daily. Takes 1 tablet in the am, 2 at lunch, 2 dinner and 2 at bedtime      . HYDROcodone-acetaminophen (NORCO) 10-325 MG per tablet Take 1 tablet by mouth every 6 (six) hours as needed. pain      . LORazepam (ATIVAN) 0.5 MG tablet Take 0.5 mg by mouth every 6 (six) hours as needed. For anxiety      . losartan (COZAAR) 100 MG tablet TAKE 1 TABLET BY MOUTH EVERY DAY  30 tablet  11  . magnesium oxide (MAG-OX) 400 MG tablet Take 400 mg by mouth daily.       [name]  metFORMIN (GLUCOPHAGE) 500 MG tablet Take 500 mg by mouth daily.       . mirtazapine (REMERON) 30 MG tablet Take 30 mg by mouth at bedtime.      . potassium chloride SA (K-DUR,KLOR-CON) 20 MEQ tablet Take 20 mEq by mouth 2 (two) times daily.       [name] PROAIR HFA 108 (90 BASE) MCG/ACT inhaler Inhale into the lungs as directed.       . simvastatin (ZOCOR) 10 MG tablet Take 1 tablet (10 mg total) by mouth at bedtime.  90 tablet  3  . sulfamethoxazole-trimethoprim (BACTRIM DS) 800-160 MG per tablet Take by mouth as directed.       [name] DISCONTD: sertraline (ZOLOFT) 100 MG tablet Take 100 mg by mouth daily.        Allergies  Allergen Reactions  . Calan (Verapamil Hcl) Other (See Comments)    Patient states it makes her "out of her mind"  . Crestor (Rosuvastatin Calcium) Other (See Comments)    Muscle pain  . Escitalopram Oxalate Other (See Comments)    Unknown   . Lipitor (Atorvastatin Calcium) Other (See Comments)    myalgias  . Lopressor (Metoprolol  Tartrate) Other (See Comments)    Blood pressure bottoms out   . Norvasc (Amlodipine Besylate) Other (See Comments)    fatigue  . Penicillins Hives and Rash    Review of Systems negative except from HPI and PMH  Physical Exam BP 122/59  Pulse 65  Ht 5\' 4"  (1.626 m)  Wt 145 lb 1.9 oz (65.826 kg)  BMI 24.91 kg/m2 Well developed and well nourished wearing oxygen in no acute distress HENT normal E scleral and icterus clear Neck Supple JVP 9-10 carotids brisk and full Clear to ausculation Regular rate and rhythm, no murmurs gallops or rub Soft with active bowel sounds No clubbing cyanosis Trace Edema Alert and oriented, grossly normal motor and sensory function Skin Warm and Dry She thenar weakness   Assessment and  Plan

## 2012-04-24 NOTE — Discharge Instructions (Signed)
Pacemaker Battery Change A pacemaker battery usually lasts 4 to 12 years. Once or twice per year, you will be asked to visit your caregiver to have a full evaluation of your pacemaker. When a battery needs to be replaced, the entire pacemaker is actually replaced so that you can benefit from new circuitry and any new features that have recently been added to pacemakers. Most often, this procedure is very simple because the leads are already in place. After giving medicine to numb the skin, your health care provider makes a cut to reopen the pocket holding the pacemaker and disconnects the old device from its leads. The leads are routinely tested at this time. If they are working okay, the new pacemaker may simply be connected to the existing leads. If there is any problem with the old lead system, it may be wise to replace the lead system while inserting the new pacemaker. There are many things that affect how long a pacemaker battery will last:  Age of the pacemaker.   Number of leads (1, 2 or 3).   Pacemaker work load. If the pacemaker is helping the heart more often, then the battery will not last as long as if the pacemaker does not need to help the heart.   Resistance of the leads. The greater the resistance, the greater the drain on the battery. This can increase as the leads get older or if one or more of the leads does not have the best contact with the heart.   Power (voltage) settings.   The health of the person's heart. If the health of the heart gets worse, then the pacemaker may have to work more often and the setting changed to accommodate these changes.  Your health care provider will be alerted to the fact that it is time to replace the battery during follow-up exams. He or she will check your pacemaker using a small table-top computer, called a programmer, and a wand. The wand is about the same size as a remote control. Your provider puts the wand on your body in the area where the  pacemaker is located. Information from the pacemaker is received about how well your heart is working and the status of the battery. It is not painful, and it usually takes just a few minutes. You will have plenty of time before the battery is fully used up to plan for replacement.  LET YOUR CAREGIVER KNOW ABOUT:   Symptoms of chest pain, trouble breathing, palpitations, lightheadedness, or feelings of an abnormal or irregular heart beat.   Allergies.   Medications taken including herbs, eye drops, over the counter medications, and creams   Use of steroids (by mouth or creams).   Possible pregnancy, if applicable.   Previous problems with anesthetics or Novocaine.   History of blood clots (thrombophlebitis).   History of bleeding or blood problems.   Surgery since your last pacemaker placement.   Other health problems.  RISKS AND COMPLICATIONS These are very uncommon but include:  Bleeding.   Bruising of the skin around where the incision was made.   Pain at the site of the incision.   Pulling apart of the skin at the incision site.   Infection.   Allergic reaction to anesthetics or medicines used during the procedure.  Diabetics may have a temporary increase in their blood sugar after any surgical procedure.  BEFORE THE PROCEDURE  Wash all of the skin around the area of the chest where the pacemaker is located.   Try to remove any loose, scaling skin. Unless advised otherwise, avoid using aspirin, ibuprofen, or naproxen for 3-4 days before the procedure. Ask your caregiver for help with any other medication adjustments before the pacemaker is replaced. Unless advised otherwise, do not eat or drink after midnight on the night before the procedure EXCEPT for drinking water and taking your medications as you normally would. AFTER THE PROCEDURE   A heart monitor and the pacemaker programmer will be used to make sure that the new pacemaker is working properly.   You can go home  after the procedure.   Your caregiver will advise you if you need to have any stitches. They will be removed 5-7 days after the procedure.  HOME CARE INSTRUCTIONS   Keep the incision clean and dry.   Unless advised otherwise, you may shower after carefully covering the incision with plastic wrap that is taped to your chest.   For the first week after the replacement, avoid stretching motions that pull at the incision site and avoid heavy exercise with the arm on the same side as the incision.   Only take over-the-counter or prescription medicines for pain, discomfort, or fever as directed by your caregiver.   Your caregiver will tell you when you will need to next test your pacemaker by telephone or when to return to the office for re-exam and/or removal of stitches, if necessary.  SEEK MEDICAL CARE IF:   You have unusual pain at the incision site that is not adequately helped by over-the-counter or prescription medicine.   There is drainage or pus from the incision site.   You develop red streaking that extends above or below the incision site.   You feel brief intermittent palpitations, lightheadedness or any symptoms that you feel might be related to your heart.  SEEK IMMEDIATE MEDICAL CARE IF:   You experience chest pain that is different than the pain at the incision site.   You experience:   Shortness of breath.   Palpitations.   Irregular heart beat.   Lightheadedness that does not go away quickly.   Fainting.   You develop a fever.   You have pain that gets worse even though you are taking pain medicine.  MAKE SURE YOU:   Understand these instructions.   Will watch your condition.   Will get help right away if you are not doing well or get worse.  Document Released: 02/05/2007 Document Revised: 10/17/2011 Document Reviewed: 05/11/2007 [employer] Patient Information 2012 [employer]. 

## 2012-04-24 NOTE — Interval H&P Note (Signed)
History and Physical Interval Note:  04/24/2012 10:08 AM  [name] [name]  has presented today for surgery, with the diagnosis of End of life  The various methods of treatment have been discussed with the patient and family. After consideration of risks, benefits and other options for treatment, the patient has consented to  Procedure(s) (LRB): PACEMAKER GENERATOR CHANGE (N/A) as a surgical intervention .  The patients' history has been reviewed, patient examined, no change in status, stable for surgery.  I have reviewed the patients' chart and labs.  Questions were answered to the patient's satisfaction.     Sherryl Manges  Interval allergic rhinitis without fever or sore thoat

## 2012-04-27 ENCOUNTER — Other Ambulatory Visit: Payer: Self-pay | Admitting: Cardiology

## 2012-04-27 NOTE — Telephone Encounter (Signed)
Refilled diltiazem 

## 2012-05-04 ENCOUNTER — Encounter: Payer: Self-pay | Admitting: Internal Medicine

## 2012-05-04 ENCOUNTER — Ambulatory Visit (INDEPENDENT_AMBULATORY_CARE_PROVIDER_SITE_OTHER): Payer: Medicare Other | Admitting: *Deleted

## 2012-05-04 DIAGNOSIS — I4891 Unspecified atrial fibrillation: Secondary | ICD-10-CM

## 2012-05-04 NOTE — Progress Notes (Signed)
Wound check-PPM 

## 2012-05-11 LAB — PACEMAKER DEVICE OBSERVATION
RV LEAD AMPLITUDE: 22.4 mv
RV LEAD THRESHOLD: 1.25 V
VENTRICULAR PACING PM: 68

## 2012-05-19 ENCOUNTER — Telehealth: Payer: Self-pay | Admitting: Cardiology

## 2012-05-19 MED ORDER — DILTIAZEM HCL ER COATED BEADS 120 MG PO CP24: 120.0000 mg | ORAL_CAPSULE | Freq: Two times a day (BID) | ORAL | Status: DC

## 2012-05-19 NOTE — Telephone Encounter (Signed)
Please return call to patient at [phone] regarding medication dosage.

## 2012-05-19 NOTE — Telephone Encounter (Signed)
Patient states she has been taking her Cardizem twice a day and received a recent refill for one a day. Reviewed last dictation and does say twice a day

## 2012-05-19 NOTE — Telephone Encounter (Signed)
Pharm has dosed pt diltiazem to one pill a day and she takes two pills and needs [place] to call them because they need a new Rx

## 2012-05-19 NOTE — Telephone Encounter (Signed)
Left message to call back  

## 2012-06-09 ENCOUNTER — Other Ambulatory Visit (INDEPENDENT_AMBULATORY_CARE_PROVIDER_SITE_OTHER): Payer: Medicare Other

## 2012-06-09 DIAGNOSIS — I119 Hypertensive heart disease without heart failure: Secondary | ICD-10-CM

## 2012-06-09 LAB — HEPATIC FUNCTION PANEL
ALT: 10 U/L (ref 0–35)
AST: 26 U/L (ref 0–37)
Bilirubin, Direct: 0.1 mg/dL (ref 0.0–0.3)
Total Bilirubin: 1 mg/dL (ref 0.3–1.2)

## 2012-06-09 LAB — BASIC METABOLIC PANEL
BUN: 23 mg/dL (ref 6–23)
CO2: 32 mEq/L (ref 19–32)
Chloride: 101 mEq/L (ref 96–112)
Potassium: 4.1 mEq/L (ref 3.5–5.1)

## 2012-06-09 LAB — LIPID PANEL
Cholesterol: 176 mg/dL (ref 0–200)
LDL Cholesterol: 100 mg/dL — ABNORMAL HIGH (ref 0–99)
Total CHOL/HDL Ratio: 4

## 2012-06-11 NOTE — Progress Notes (Signed)
Quick Note:  Please make copy of labs for patient visit. ______ 

## 2012-06-12 ENCOUNTER — Ambulatory Visit (INDEPENDENT_AMBULATORY_CARE_PROVIDER_SITE_OTHER): Payer: Medicare Other | Admitting: Cardiology

## 2012-06-12 ENCOUNTER — Encounter: Payer: Self-pay | Admitting: Cardiology

## 2012-06-12 VITALS — BP 129/61 | HR 61 | Ht 64.0 in | Wt 140.1 lb

## 2012-06-12 DIAGNOSIS — E7800 Pure hypercholesterolemia, unspecified: Secondary | ICD-10-CM

## 2012-06-12 DIAGNOSIS — I4891 Unspecified atrial fibrillation: Secondary | ICD-10-CM

## 2012-06-12 DIAGNOSIS — R0602 Shortness of breath: Secondary | ICD-10-CM

## 2012-06-12 NOTE — Assessment & Plan Note (Signed)
The patient has not had any thromboembolic episodes from her atrial fibrillation

## 2012-06-12 NOTE — Assessment & Plan Note (Signed)
The patient remains on simvastatin for her hypercholesterolemia.  She's not having any myalgias

## 2012-06-12 NOTE — Assessment & Plan Note (Signed)
Her shortness of breath has been stable on current regimen.  She is on round-the-clock oxygen.  He has significant pulmonary hypertension.

## 2012-06-12 NOTE — Progress Notes (Signed)
[name] [name] Date of Birth:  23-Dec-1935 [facility] [address] [place], [place]  [address] [phone]  Fax   [phone]  HPI: This pleasant 76 year old woman is seen for a scheduled four-month followup office visit.[name] she has a history of tonic atrial fibrillation.  She has a pacemaker in place.  She has mild focal basal septal hypertrophy by echocardiogram 02/19/12.  She has a history of poor balance weakness and frequent falls.  She previously was on Coumadin which had to be stopped because of her fall risk he now takes a daily 81 mg aspirin.  The patient has also had problems with urinary frequency.  She has difficulty taking the amount of Lasix that she needs to take for her dyspnea and pulmonary hypertension.  Current Outpatient Prescriptions  Medication Sig Dispense Refill  . Ascorbic Acid (VITAMIN C) 500 MG tablet Take 500 mg by mouth daily.       [name] aspirin 325 MG tablet Take 325 mg by mouth daily.      . bisoprolol (ZEBETA) 10 MG tablet Take 10 mg by mouth daily.       . calcium carbonate (OS-CAL) 600 MG TABS Take 600 mg by mouth daily.        . Cholecalciferol (VITAMIN D) 2000 UNITS CAPS Take 1 capsule by mouth daily.        [name] conjugated estrogens (PREMARIN) vaginal cream Place 0.625 g vaginally as needed. For vaginal dryness      . diltiazem (CARDIZEM CD) 120 MG 24 hr capsule Take 1 capsule (120 mg total) by mouth 2 (two) times daily.  180 capsule  3  . famotidine (PEPCID) 20 MG tablet Take 20 mg by mouth 2 (two) times daily as needed. For GERD      . furosemide (LASIX) 40 MG tablet Take 80 mg alternating with 40 mg      . gabapentin (NEURONTIN) 300 MG capsule Take 300-600 mg by mouth 4 (four) times daily. Takes 1 tablet in the am, 2 at lunch, 2 dinner and 2 at bedtime      . HYDROcodone-acetaminophen (NORCO) 10-325 MG per tablet Take 1 tablet by mouth every 6 (six) hours as needed. pain      . LORazepam (ATIVAN) 0.5 MG tablet Take 0.5 mg by mouth every 6  (six) hours as needed. For anxiety      . losartan (COZAAR) 100 MG tablet TAKE 1 TABLET BY MOUTH EVERY DAY  30 tablet  11  . magnesium oxide (MAG-OX) 400 MG tablet Take 400 mg by mouth daily.       . metFORMIN (GLUCOPHAGE) 500 MG tablet Take 500 mg by mouth daily.       . mirtazapine (REMERON) 30 MG tablet Take 50 mg by mouth at bedtime.       . potassium chloride SA (K-DUR,KLOR-CON) 20 MEQ tablet Take 20 mEq by mouth 2 (two) times daily.       [name] PROAIR HFA 108 (90 BASE) MCG/ACT inhaler Inhale 2 puffs into the lungs as needed. For SOB      . simvastatin (ZOCOR) 10 MG tablet Take 1 tablet (10 mg total) by mouth at bedtime.  90 tablet  3  . DISCONTD: sertraline (ZOLOFT) 100 MG tablet Take 100 mg by mouth daily.        Allergies  Allergen Reactions  . Calan (Verapamil Hcl) Other (See Comments)    Patient states it makes her "out of her mind"; bp bottoms out  .  Crestor (Rosuvastatin Calcium) Other (See Comments)    Muscle pain  . Escitalopram Oxalate Other (See Comments)    Unknown   . Lipitor (Atorvastatin Calcium) Other (See Comments)    myalgia  . Lopressor (Metoprolol Tartrate) Other (See Comments)    Blood pressure bottoms out   . Norvasc (Amlodipine Besylate) Other (See Comments)    fatigue  . Sulfa Antibiotics Nausea Only and Other (See Comments)    Makes her stomach hurt  . Penicillins Hives and Rash    Patient Active Problem List  Diagnosis  . HYPERLIPIDEMIA  . DEPRESSIVE DISORDER NOT ELSEWHERE CLASSIFIED  . HYPERTENSION  . ALLERGIC RHINITIS  . CHRONIC OBSTRUCTIVE PULMONARY DISEASE, MODERATE  . SHORTNESS OF BREATH  . Cough  . Atrial fibrillation  . Pacemaker  . Hypertrophic cardiomyopathy  . Osteoarthritis  . Situational mixed anxiety and depressive disorder  . Hypercholesterolemia  . Mitral regurgitation  . Fall  . Diabetes mellitus  . Chest pain at rest  . TIA (transient ischemic attack)    History  Smoking status  . Former Smoker  . Types: Cigarettes    . Quit date: 11/15/2005  Smokeless tobacco  . Never Used    History  Alcohol Use No    Family History  Problem Relation Age of Onset  . Other Father     died in plane crash  . Cancer Mother     lymphoma-NHL  . Coronary artery disease Grandchild     Review of Systems: The patient denies any heat or cold intolerance.  No weight gain or weight loss.  The patient denies headaches or blurry vision.  There is no cough or sputum production.  The patient denies dizziness.  There is no hematuria or hematochezia.  The patient denies any muscle aches or arthritis.  The patient denies any rash.  The patient denies frequent falling or instability.  There is no history of depression or anxiety.  All other systems were reviewed and are negative.   Physical Exam: Filed Vitals:   06/12/12 1111  BP: 129/61  Pulse: 61   general appearance reveals a well-developed elderly woman in no distress.  He has nasal oxygen in place.The head and neck exam reveals pupils equal and reactive.  Extraocular movements are full.  There is no scleral icterus.  The mouth and pharynx are normal.  The neck is supple.  The carotids reveal no bruits.  The jugular venous pressure is normal.  The  thyroid is not enlarged.  There is no lymphadenopathy.  The chest is clear to percussion and auscultation.  There are no rales or rhonchi.  Expansion of the chest is symmetrical.  The precordium is quiet.  The first heart sound is normal.  The second heart sound is physiologically split.  There is no  gallop rub or click.  There is a soft grade 1/6 systolic ejection murmur at the base. There is no abnormal lift or heave.  The abdomen is soft and nontender.  The bowel sounds are normal.  The liver and spleen are not enlarged.  There are no abdominal masses.  There are no abdominal bruits.  Extremities reveal good pedal pulses.  There is no phlebitis or edema.  There is no cyanosis or clubbing.  Strength is normal and symmetrical in all  extremities.  There is no lateralizing weakness.  There are no sensory deficits.  The skin is warm and dry.  There is no rash.      Assessment / Plan:  Continue on same medication.  Recheck in 4 months for office visit EKG and fasting lipid panel hepatic function panel and basal metabolic panel

## 2012-06-12 NOTE — Patient Instructions (Signed)
Your physician recommends that you continue on your current medications as directed. Please refer to the Current Medication list given to you today. Your physician recommends that you schedule a follow-up appointment in: 4 months with fasting labs (lp/bmet/hfp) and ekg  

## 2012-06-30 ENCOUNTER — Telehealth: Payer: Self-pay | Admitting: Cardiology

## 2012-06-30 ENCOUNTER — Ambulatory Visit (INDEPENDENT_AMBULATORY_CARE_PROVIDER_SITE_OTHER): Payer: Medicare Other | Admitting: Cardiology

## 2012-06-30 VITALS — BP 106/60 | HR 68 | Wt 144.0 lb

## 2012-06-30 DIAGNOSIS — I10 Essential (primary) hypertension: Secondary | ICD-10-CM

## 2012-06-30 DIAGNOSIS — I421 Obstructive hypertrophic cardiomyopathy: Secondary | ICD-10-CM

## 2012-06-30 DIAGNOSIS — I4891 Unspecified atrial fibrillation: Secondary | ICD-10-CM

## 2012-06-30 DIAGNOSIS — E785 Hyperlipidemia, unspecified: Secondary | ICD-10-CM

## 2012-06-30 DIAGNOSIS — R0602 Shortness of breath: Secondary | ICD-10-CM

## 2012-06-30 DIAGNOSIS — I119 Hypertensive heart disease without heart failure: Secondary | ICD-10-CM

## 2012-06-30 DIAGNOSIS — E7800 Pure hypercholesterolemia, unspecified: Secondary | ICD-10-CM

## 2012-06-30 NOTE — Progress Notes (Signed)
[name] [name] Date of Birth:  1936-11-02 [name] [address] [place], [place]  [address] [phone]         Fax   [phone]  History of Present Illness: This pleasant 76 year old woman is seen for a scheduled four-month followup office visit.[name] [name] she has a history of tonic atrial fibrillation. She has a pacemaker in place. She has mild focal basal septal hypertrophy by echocardiogram 02/19/12. She has a history of poor balance weakness and frequent falls. She previously was on Coumadin which had to be stopped because of her fall risk he now takes a daily 81 mg aspirin. The patient has also had problems with urinary frequency. She has difficulty taking the amount of Lasix that she needs to take for her dyspnea and pulmonary hypertension. Since last visit she complains that the Lasix is making her feel extremely tired.  She has been taking 80 mg one day alternating with 40 mg the next day.  She continues to have a lot of dyspnea on lot of which is secondary to the fact that her nose feels blocked and she has difficulty breathing through her nose.  Dr. Clovis Riley has given her an inhaler to use she still complains of difficulty with her nose feeling closed.  I have suggested that she might also want to try some over-the-counter saline nasal spray.   Current Outpatient Prescriptions  Medication Sig Dispense Refill  . Ascorbic Acid (VITAMIN C) 500 MG tablet Take 500 mg by mouth daily.       [name] [name] aspirin 325 MG tablet Take 325 mg by mouth daily.      . ASTEPRO 0.15 % SOLN Place 2 sprays into the nose 2 (two) times daily.       . bisoprolol (ZEBETA) 10 MG tablet Take 10 mg by mouth daily.       . calcium carbonate (OS-CAL) 600 MG TABS Take 600 mg by mouth daily.        . Cholecalciferol (VITAMIN D) 2000 UNITS CAPS Take 1 capsule by mouth daily.        [name] [name] conjugated estrogens (PREMARIN) vaginal cream Place 0.625 g vaginally as needed. For vaginal dryness      . diltiazem (CARDIZEM  CD) 120 MG 24 hr capsule Take 1 capsule (120 mg total) by mouth 2 (two) times daily.  180 capsule  3  . famotidine (PEPCID) 20 MG tablet Take 20 mg by mouth 2 (two) times daily as needed. For GERD      . furosemide (LASIX) 40 MG tablet Take 40 mg by mouth daily.       [name] [name] gabapentin (NEURONTIN) 300 MG capsule Take 300-600 mg by mouth 4 (four) times daily. Takes 1 tablet in the am, 2 at lunch, 2 dinner and 2 at bedtime      . HYDROcodone-acetaminophen (NORCO) 10-325 MG per tablet Take 1 tablet by mouth every 6 (six) hours as needed. pain      . LORazepam (ATIVAN) 0.5 MG tablet Take 0.5 mg by mouth every 6 (six) hours as needed. For anxiety      . losartan (COZAAR) 100 MG tablet TAKE 1 TABLET BY MOUTH EVERY DAY  30 tablet  11  . magnesium oxide (MAG-OX) 400 MG tablet Take 400 mg by mouth daily.       . metFORMIN (GLUCOPHAGE) 500 MG tablet Take 500 mg by mouth daily.       . mirtazapine (REMERON) 30 MG tablet Take 50 mg by mouth  at bedtime.       . potassium chloride SA (K-DUR,KLOR-CON) 20 MEQ tablet Take 20 mEq by mouth 2 (two) times daily.       [name] [name] PROAIR HFA 108 (90 BASE) MCG/ACT inhaler Inhale 2 puffs into the lungs as needed. For SOB      . simvastatin (ZOCOR) 10 MG tablet Take 1 tablet (10 mg total) by mouth at bedtime.  90 tablet  3  . DISCONTD: sertraline (ZOLOFT) 100 MG tablet Take 100 mg by mouth daily.        Allergies  Allergen Reactions  . Calan (Verapamil Hcl) Other (See Comments)    Patient states it makes her "out of her mind"; bp bottoms out  . Crestor (Rosuvastatin Calcium) Other (See Comments)    Muscle pain  . Escitalopram Oxalate Other (See Comments)    Unknown   . Lipitor (Atorvastatin Calcium) Other (See Comments)    myalgia  . Lopressor (Metoprolol Tartrate) Other (See Comments)    Blood pressure bottoms out   . Norvasc (Amlodipine Besylate) Other (See Comments)    fatigue  . Sulfa Antibiotics Nausea Only and Other (See Comments)    Makes her stomach hurt  .  Penicillins Hives and Rash    Patient Active Problem List  Diagnosis  . HYPERLIPIDEMIA  . DEPRESSIVE DISORDER NOT ELSEWHERE CLASSIFIED  . HYPERTENSION  . ALLERGIC RHINITIS  . CHRONIC OBSTRUCTIVE PULMONARY DISEASE, MODERATE  . SHORTNESS OF BREATH  . Cough  . Atrial fibrillation  . Pacemaker  . Hypertrophic cardiomyopathy  . Osteoarthritis  . Situational mixed anxiety and depressive disorder  . Hypercholesterolemia  . Mitral regurgitation  . Fall  . Diabetes mellitus  . Chest pain at rest  . TIA (transient ischemic attack)    History  Smoking status  . Former Smoker  . Types: Cigarettes  . Quit date: 11/15/2005  Smokeless tobacco  . Never Used    History  Alcohol Use No    Family History  Problem Relation Age of Onset  . Other Father     died in plane crash  . Cancer Mother     lymphoma-NHL  . Coronary artery disease Grandchild     Review of Systems: Constitutional: no fever chills diaphoresis or fatigue or change in weight.  Head and neck: no hearing loss, no epistaxis, no photophobia or visual disturbance. Respiratory: No cough, shortness of breath or wheezing. Cardiovascular: No chest pain peripheral edema, palpitations. Gastrointestinal: No abdominal distention, no abdominal pain, no change in bowel habits hematochezia or melena. Genitourinary: No dysuria, no frequency, no urgency, no nocturia. Musculoskeletal:No arthralgias, no back pain, no gait disturbance or myalgias. Neurological: No dizziness, no headaches, no numbness, no seizures, no syncope, no weakness, no tremors. Hematologic: No lymphadenopathy, no easy bruising. Psychiatric: No confusion, no hallucinations, no sleep disturbance.    Physical Exam: Filed Vitals:   06/30/12 1505  BP: 106/60  Pulse: 68   the general appearance reveals a well-developed well-nourished woman in no acute distress.  She is wearing nasal oxygen at 2 L a minute around-the-clock.The head and neck exam reveals  pupils equal and reactive.  Extraocular movements are full.  There is no scleral icterus.  The mouth and pharynx are normal.  The neck is supple.  The carotids reveal no bruits.  The jugular venous pressure is normal.  The  thyroid is not enlarged.  There is no lymphadenopathy.  The chest is clear to percussion and auscultation.  There are no rales or  rhonchi.  Expansion of the chest is symmetrical.  The precordium is quiet.  The first heart sound is normal.  The second heart sound is physiologically split.  There is no gallop rub or click.  There is a grade 2/6 systolic ejection murmur at the left sternal edge.  There is no abnormal lift or heave.  The abdomen is soft and nontender.  The bowel sounds are normal.  The liver and spleen are not enlarged.  There are no abdominal masses.  There are no abdominal bruits.  Extremities reveal good pedal pulses.  There is no phlebitis or edema.  There is no cyanosis or clubbing.  Strength is normal and symmetrical in all extremities.  There is no lateralizing weakness.  There are no sensory deficits.  The skin is warm and dry.  There is no rash.  EKG today shows LVH with strain pattern and she is in atrial fibrillation and she has a paced ventricular rhythm with some native QRS beats  Assessment / Plan: Reduce furosemide to 40 mg daily.  Try over-the-counter saline nasal spray to help open up her nasal passages.  Continue with the medication that Dr. Clovis Riley has prescribed for her nose.  Recheck here in 4 months for followup office visit lipid panel hepatic function panel and basal metabolic panel

## 2012-06-30 NOTE — Assessment & Plan Note (Signed)
The patient is no longer on Coumadin because of her fall risk.  She is on an aspirin daily.  She denies any TIA symptoms.

## 2012-06-30 NOTE — Telephone Encounter (Signed)
New msg Pt wants to talk to you about furosemide. She said she feels week when she takes the med.

## 2012-06-30 NOTE — Assessment & Plan Note (Signed)
The patient has a history of hypercholesterolemia and is on low-dose simvastatin.  She is not having any myalgias from the statin therapy

## 2012-06-30 NOTE — Assessment & Plan Note (Signed)
Blood pressure is stable on current dose.  Her blood pressure has been in the low normal range recently

## 2012-06-30 NOTE — Telephone Encounter (Signed)
Patient complaining of not feeling well and chest pains and shortness of breath.  Scheduled appt today

## 2012-06-30 NOTE — Patient Instructions (Signed)
Decrease your Lasix to 40 mg daily  Try some OTC saline nasal spray as needed  Your physician recommends that you schedule a follow-up appointment in: 4 months with fasting labs (lp/bmet/hfp)

## 2012-06-30 NOTE — Assessment & Plan Note (Signed)
Her examination today does not suggest fluid overload.  She does not have any sign of edema and her lungs are clear.  We will try cutting back on her furosemide to just 40 mg every day

## 2012-07-01 IMAGING — CT CT HEAD W/O CM
4 of 5 series · 16 of 40 positions shown, 19 images · non-contrast
Comparison: 02/28/2010

CT HEAD

CLINICAL DATA: Fall

CT HEAD WITHOUT CONTRAST
CT CERVICAL SPINE WITHOUT CONTRAST
TECHNIQUE: Multidetector CT imaging of the head and cervical spine
was performed following the standard protocol without intravenous
contrast.  Multiplanar CT image reconstructions of the cervical
spine were also generated.

[Series 3: head_seq 4.5 h37s st · axial · 0.43mm/px · z∈[-80,-30]mm · 2 of 35 slices shown]
[im 12/35  brain]
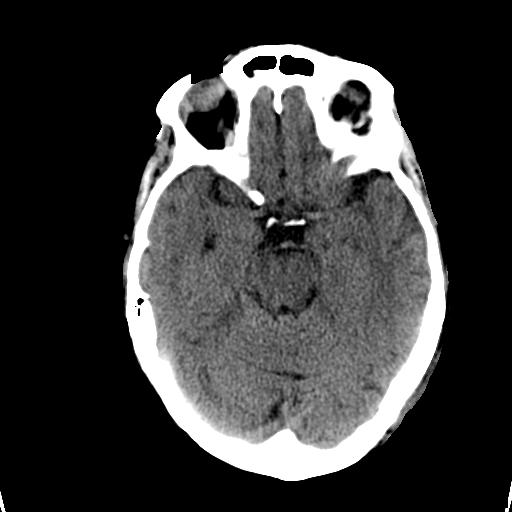
[im 23/35  brain]
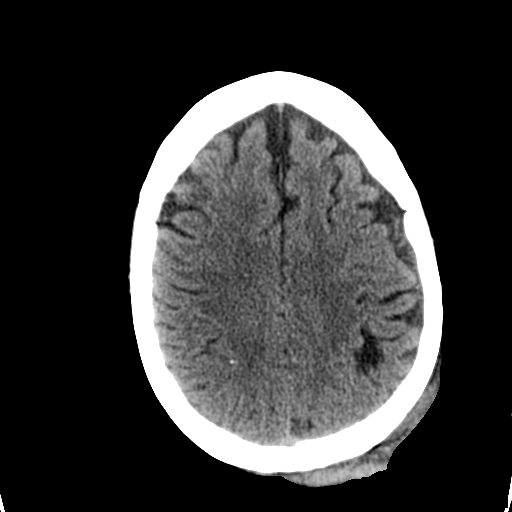

[Series 4: head_seq 3.0 h60s bone · axial · 0.43mm/px · z∈[-100,-67]mm · 2 of 54 slices shown]
[im 11/54  bone]
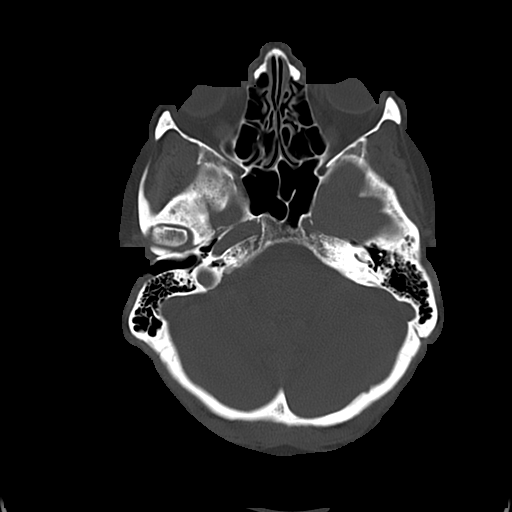
[im 22/54  bone]
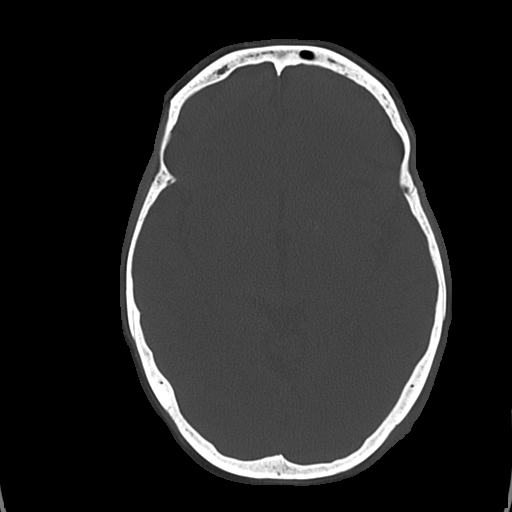

[Series 602: <mpr thick range> · axial · 0.30mm/px · z∈[-297,-161]mm · 9 of 98 slices shown, 12 images]
[im 10/98  brain]
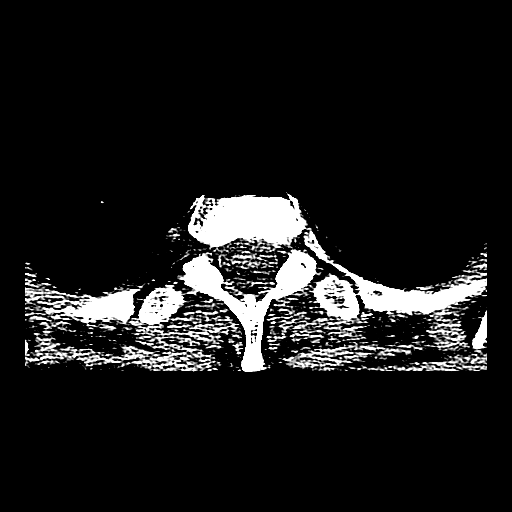
[im 10/98  bone]
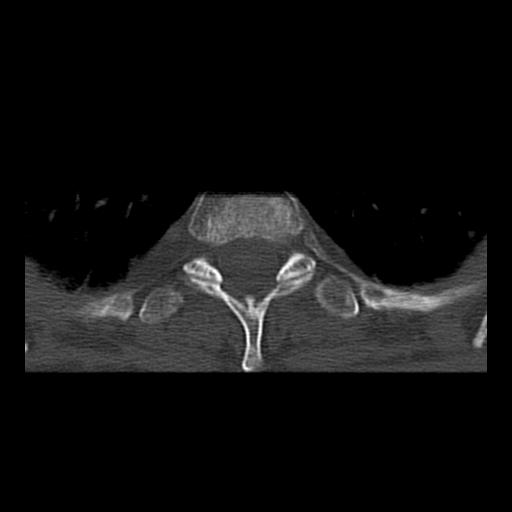
[im 20/98  brain]
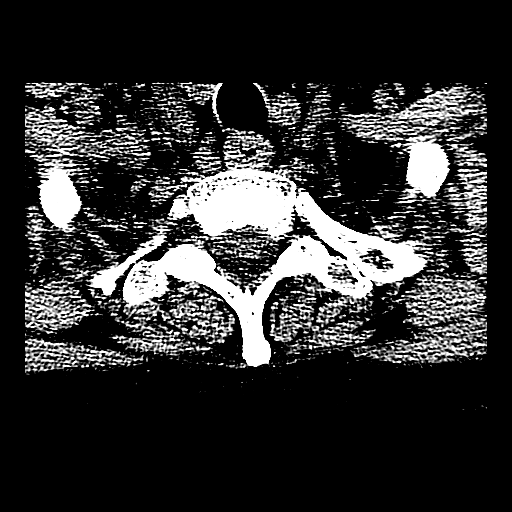
[im 30/98  brain]
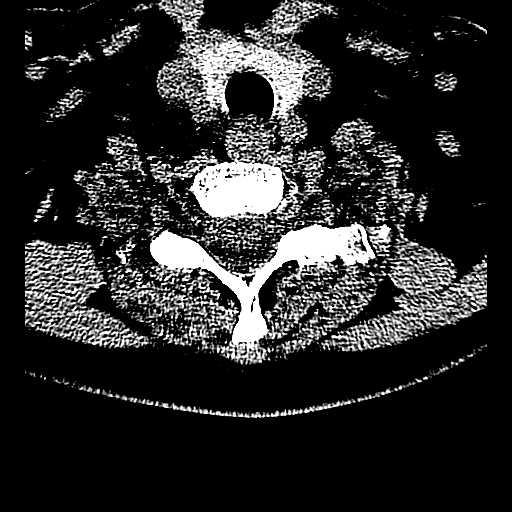
[im 39/98  brain]
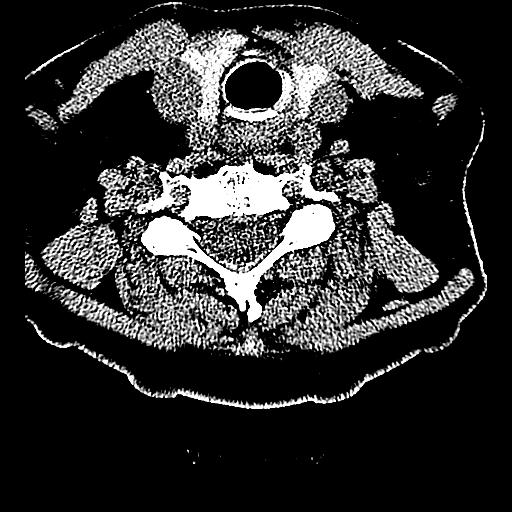
[im 49/98  brain]
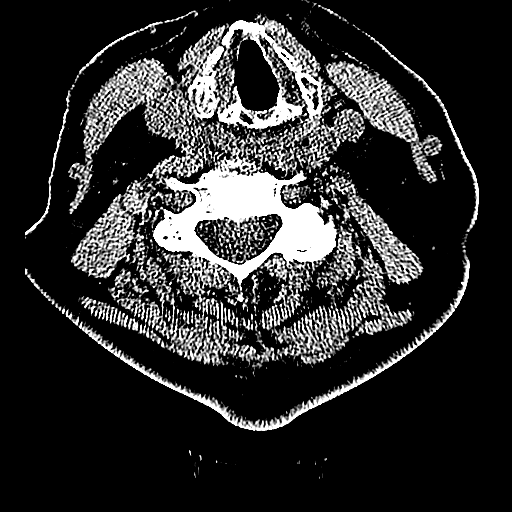
[im 49/98  bone]
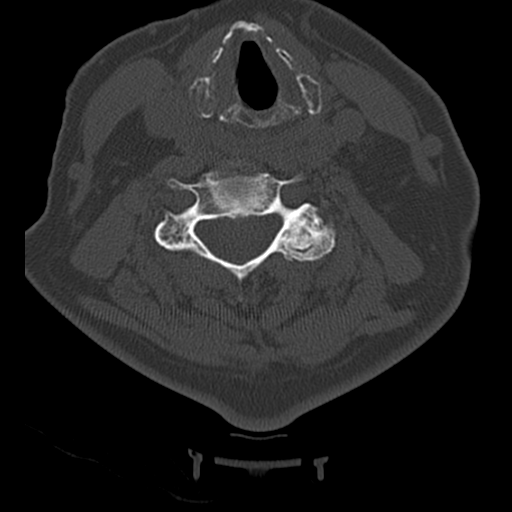
[im 59/98  brain]
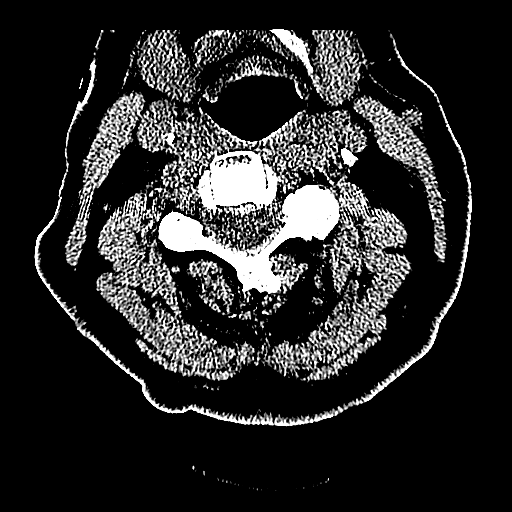
[im 68/98  brain]
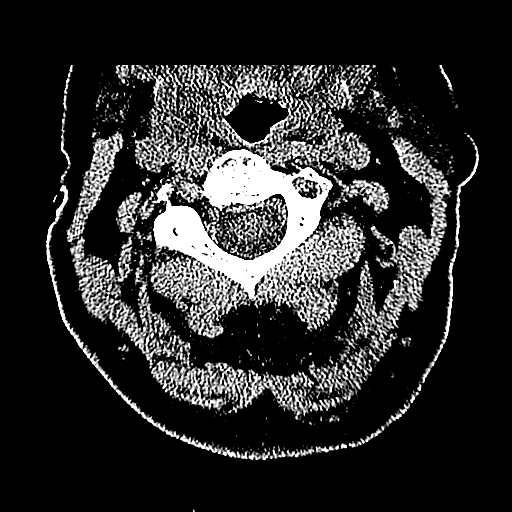
[im 78/98  brain]
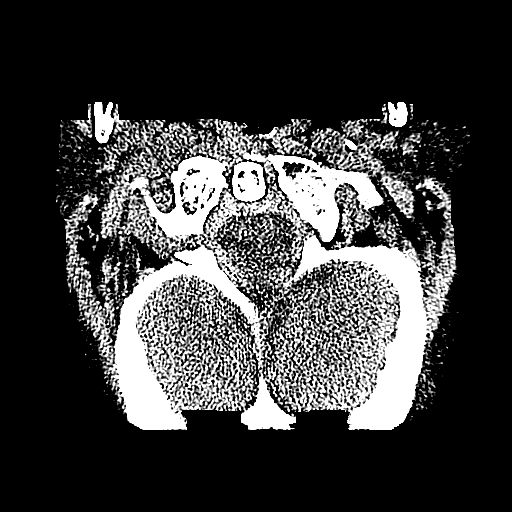
[im 88/98  brain]
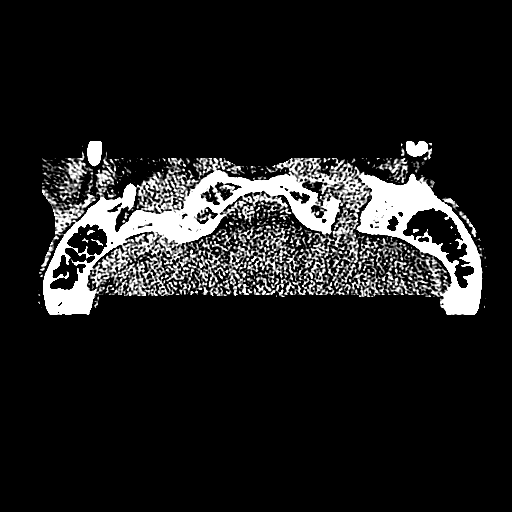
[im 88/98  bone]
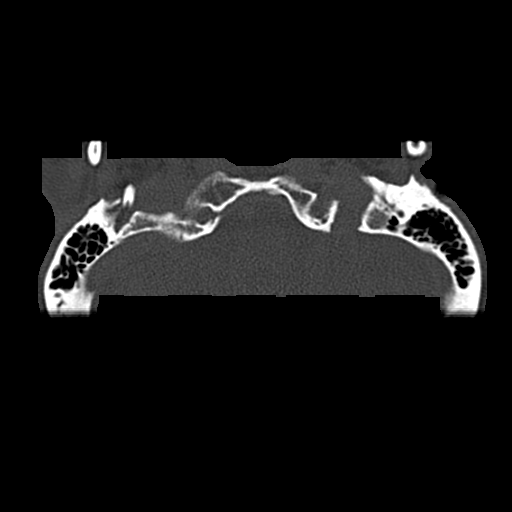

[Series 603: <mpr thick range(1)> · coronal · 0.30mm/px · 3 of 72 slices shown]
[im 24/72  brain]
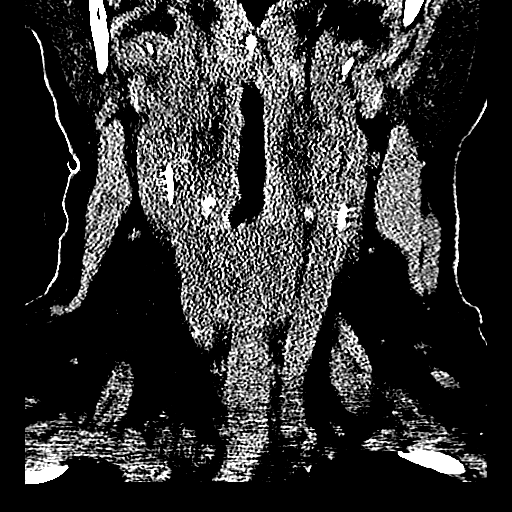
[im 32/72  brain]
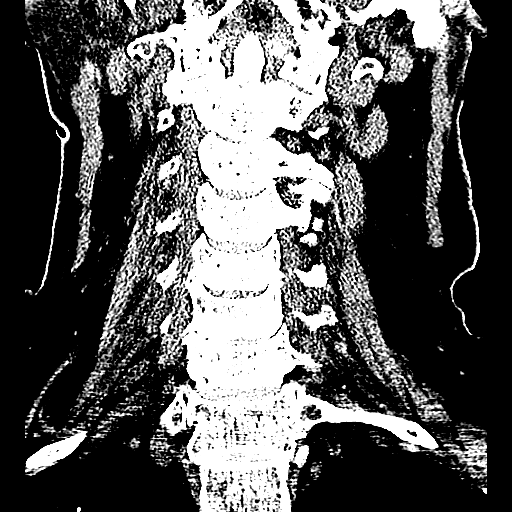
[im 40/72  brain]
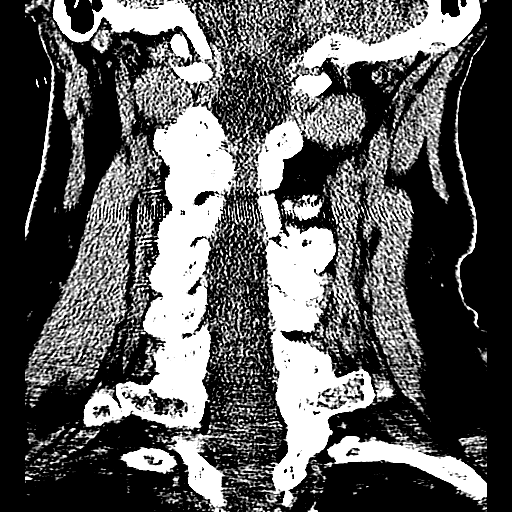

[16 of 40 positions shown; findings below may reference images not displayed]

FINDINGS: Left parietal scalp hematoma and laceration.  No skull
fracture.

Chronic microvascular ischemic change in the white matter.  No
acute infarct.  Negative for hemorrhage or mass.
IMPRESSION: Left parietal scalp hematoma and laceration.

No acute intracranial abnormality.

CT CERVICAL SPINE
FINDINGS: Negative for fracture.  Normal alignment with
straightening of the cervical lordosis.  Disc degeneration and
spondylosis C4-5, C5-6, C6-7.  Diffuse cervical facet degeneration
particularly on the left side.
IMPRESSION: Negative for fracture.

## 2012-07-02 IMAGING — CT CT ANGIO HEAD
1 of 9 series · 2 of 33 positions shown · IV contrast (APPLIED)
Comparison: Head CT [DATE]

CTA NECK

CLINICAL DATA: Syncope.  Vertigo.  Head trauma.

CT ANGIOGRAPHY HEAD AND NECK
TECHNIQUE: Multidetector CT imaging of the head and neck was
performed using the standard protocol during bolus administration
of intravenous contrast.  Multiplanar CT image reconstructions
including MIPs were obtained to evaluate the vascular anatomy.
Carotid stenosis measurements (when applicable) are obtained
utilizing NASCET criteria, using the distal internal carotid
diameter as the denominator.
Contrast:  100 ml Omnipaque 315

[Series 9: head/neck 2.0 h20f · axial · 0.57mm/px · z∈[+1208,+1312]mm · 2 of 158 slices shown]
[im 53/158  soft-tissue]
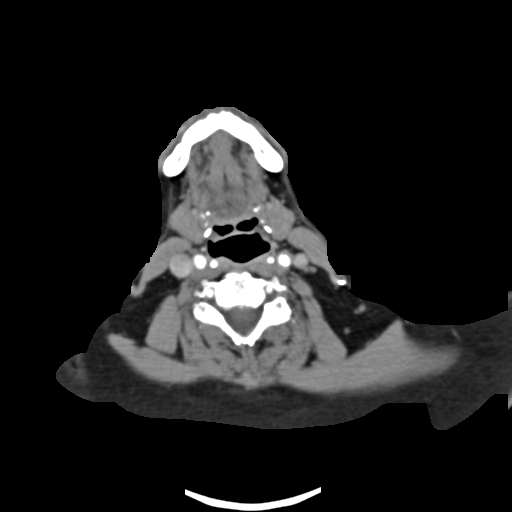
[im 105/158  bone]
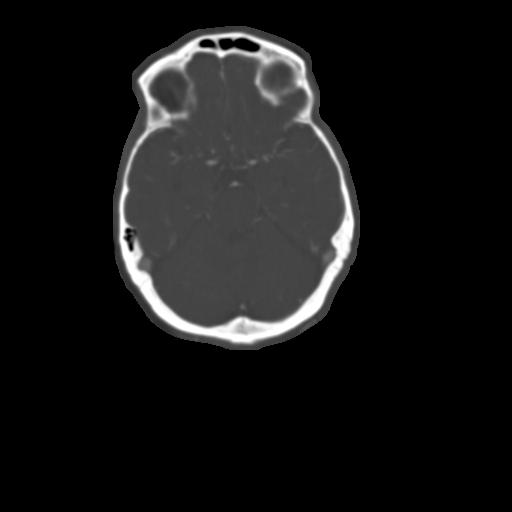

[2 of 33 positions shown; findings below may reference images not displayed]

FINDINGS: Lung apices show scarring, worse on the right than the
left.  No superior mediastinal mass.  The origin of the
brachiocephalic vessels from the arch is not included on the scan.
There is potential for proximal left subclavian artery stenosis.

The right common carotid artery is widely patent to the bifurcation
region.  There is mild calcific plaque in the carotid bifurcation
region.  Minimal diameter of the proximal internal carotid artery
is 4 mm.  Compared to a more distal cervical ICA diameter of 5 mm,
this indicates a 20% stenosis.

The left common carotid artery is widely patent to the bifurcation.
There is mild atherosclerosis of the distal common carotid artery
but no flow-limiting stenosis.  At the carotid bifurcation, there
is atherosclerotic disease with narrowing of the proximal ICA lumen
to a diameter of 1.5 mm.  Compared to a more distal cervical ICA
diameter of 5 mm, this indicates a 70% stenosis.

The right vertebral artery origin is widely patent.  No narrowing
of the cervical portion of that vessel.  The left vertebral artery
shows atherosclerosis at its origin.  Stenosis is estimated at 50-
70% but difficult to measure precisely because of the small size of
the vessel.  Beyond that, the cervical ICA is widely patent.

 Review of the MIP images confirms the above findings.
IMPRESSION: Brachiocephalic vessel origins from the arch not included on the
scan.  There is potential for moderate stenosis of the proximal
left subclavian artery.

Atherosclerotic change at the carotid bifurcation on the right.
Maximal narrowing of the ICA only 20%.

More advanced atherosclerotic disease at the carotid artery
bifurcation on the left.  70% diameter stenosis of the proximal
left ICA.

Atherosclerotic disease at the left vertebral artery origin.
Stenosis estimated at 50-70%.

CTA HEAD
FINDINGS: Both internal carotid arteries are patent into the
brain.  No siphon stenosis.  Both anterior and middle cerebral
vessels are patent without proximal stenosis, aneurysm or vascular
malformation.  Both vertebral arteries are patent to the basilar
with the right being larger than the left.  No basilar stenosis.
Posterior circulation branch vessels appear patent.

Intracranial venous structures appear patent.  No evidence of acute
brain infarction.  There is scalp swelling in the left parietal
region.  No underlying skull fracture.  No intracranial hemorrhage
or extra-axial collection.

 Review of the MIP images confirms the above findings.
IMPRESSION: No large or medium vessel intracranial arterial pathology.

## 2012-07-08 NOTE — Addendum Note (Signed)
Addended by: Lacie Scotts on: 07/08/2012 04:42 PM   Modules accepted: Orders

## 2012-07-17 IMAGING — CR DG ABDOMEN 1V
1 series · 1 of 1 positions shown · non-contrast
Comparison: CT abdomen pelvis 10/18/2007.

CLINICAL DATA: Abdominal distention.  Nausea.  Patient hospitalized
for syncope/vertigo.

ABDOMEN - 1 VIEW 01/18/2011:

[t abdomen supine]
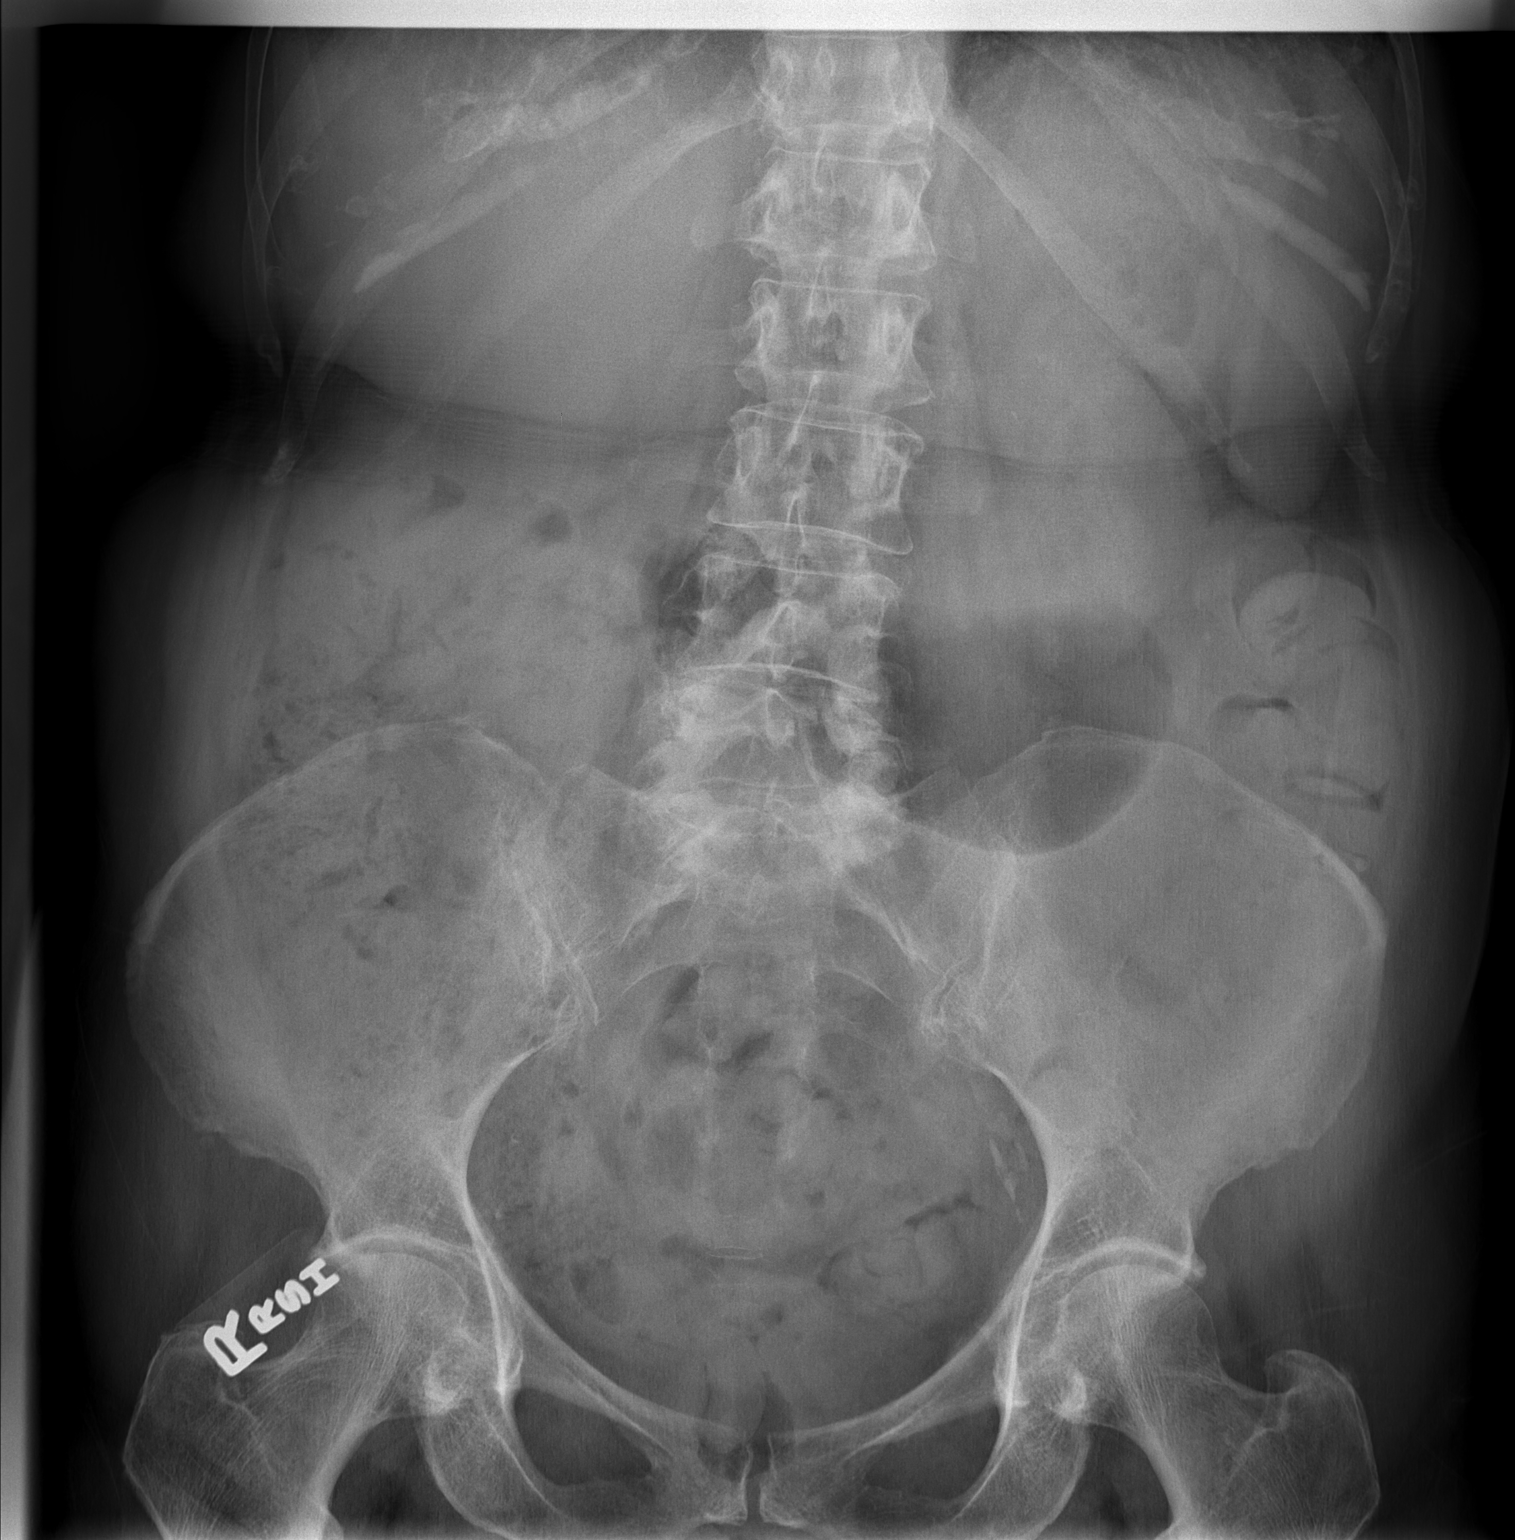

[1 of 1 positions shown; findings below may reference images not displayed]

FINDINGS: Bowel gas pattern unremarkable without evidence of
obstruction or significant ileus.  Large amount of stool throughout
normal caliber colon from cecum to rectum.  Aorto-iliofemoral
atherosclerosis without aneurysm.  No visible opaque urinary tract
calculi.  Osteopenia and degenerative changes involving the lower
thoracic spine.
IMPRESSION: No acute abdominal abnormality apart from possible constipation.

## 2012-07-24 ENCOUNTER — Telehealth: Payer: Self-pay | Admitting: Cardiology

## 2012-07-24 NOTE — Telephone Encounter (Signed)
Please return call to pt regarding oxygen, she can be reached at [phone].

## 2012-07-24 NOTE — Telephone Encounter (Signed)
Will be looking for information to be faxed. Did advised patient  Dr. Patty Sermons out of the office until 9/25, ok with waiting.  She has not seen her pulmonary doctor in a while,  Dr. Patty Sermons Rx'd oxygen

## 2012-08-10 ENCOUNTER — Ambulatory Visit (INDEPENDENT_AMBULATORY_CARE_PROVIDER_SITE_OTHER): Payer: Medicare Other | Admitting: *Deleted

## 2012-08-10 DIAGNOSIS — Z950 Presence of cardiac pacemaker: Secondary | ICD-10-CM

## 2012-08-10 DIAGNOSIS — I4891 Unspecified atrial fibrillation: Secondary | ICD-10-CM

## 2012-08-12 LAB — REMOTE PACEMAKER DEVICE: BRDY-0002RV: 60 {beats}/min

## 2012-08-25 ENCOUNTER — Encounter: Payer: Self-pay | Admitting: *Deleted

## 2012-08-25 ENCOUNTER — Telehealth: Payer: Self-pay | Admitting: Cardiology

## 2012-08-25 NOTE — Telephone Encounter (Signed)
Pt having septoplasty under generalby  dr bates and needs surgical clearance requested by fax [phone], checking on status, pls  Fax to  [phone]

## 2012-08-26 NOTE — Telephone Encounter (Signed)
Faxed again and left message if on voice mail of [name] [name]

## 2012-09-07 ENCOUNTER — Encounter: Payer: Self-pay | Admitting: Internal Medicine

## 2012-09-17 IMAGING — CR DG CERVICAL SPINE COMPLETE 4+V
6 series · 6 of 6 positions shown · non-contrast
Comparison: CT cervical spine of 01/02/2011

CLINICAL DATA: Recent fall with head trauma, some neck pain

CERVICAL SPINE - COMPLETE 4+ VIEW

[w c-spine lat]
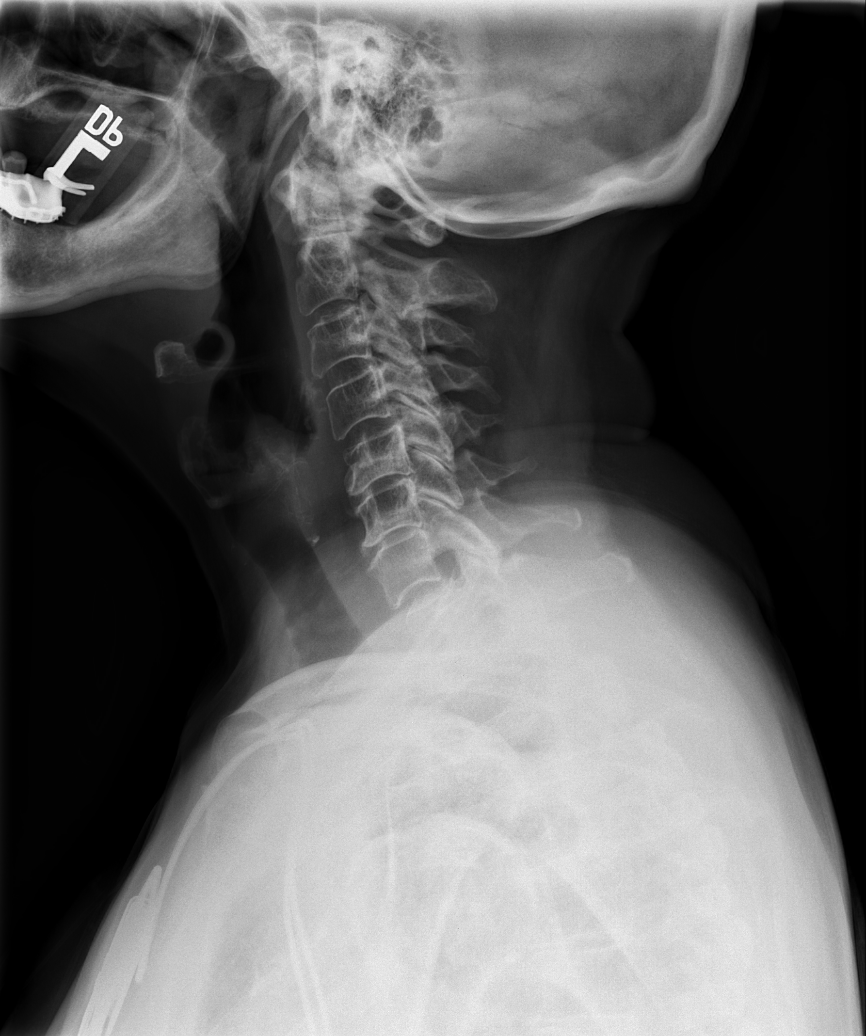

[w c-spine oblique (1 of 2)]
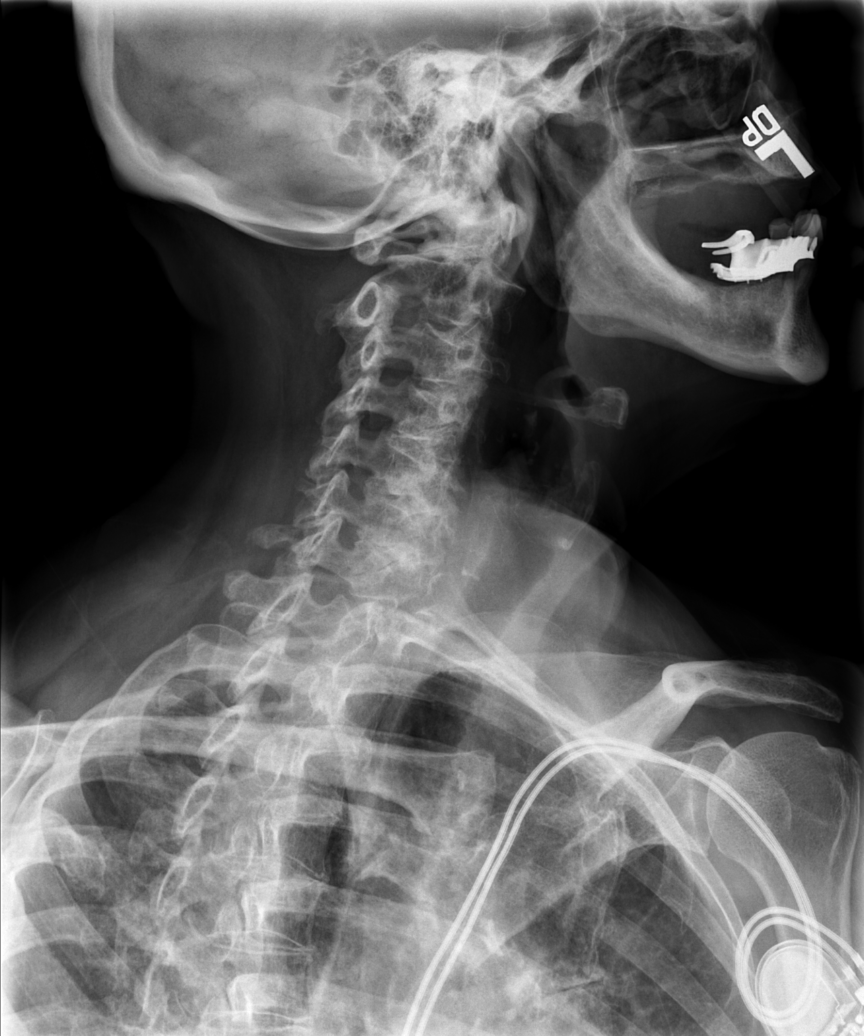

[w c-spine oblique (2 of 2)]
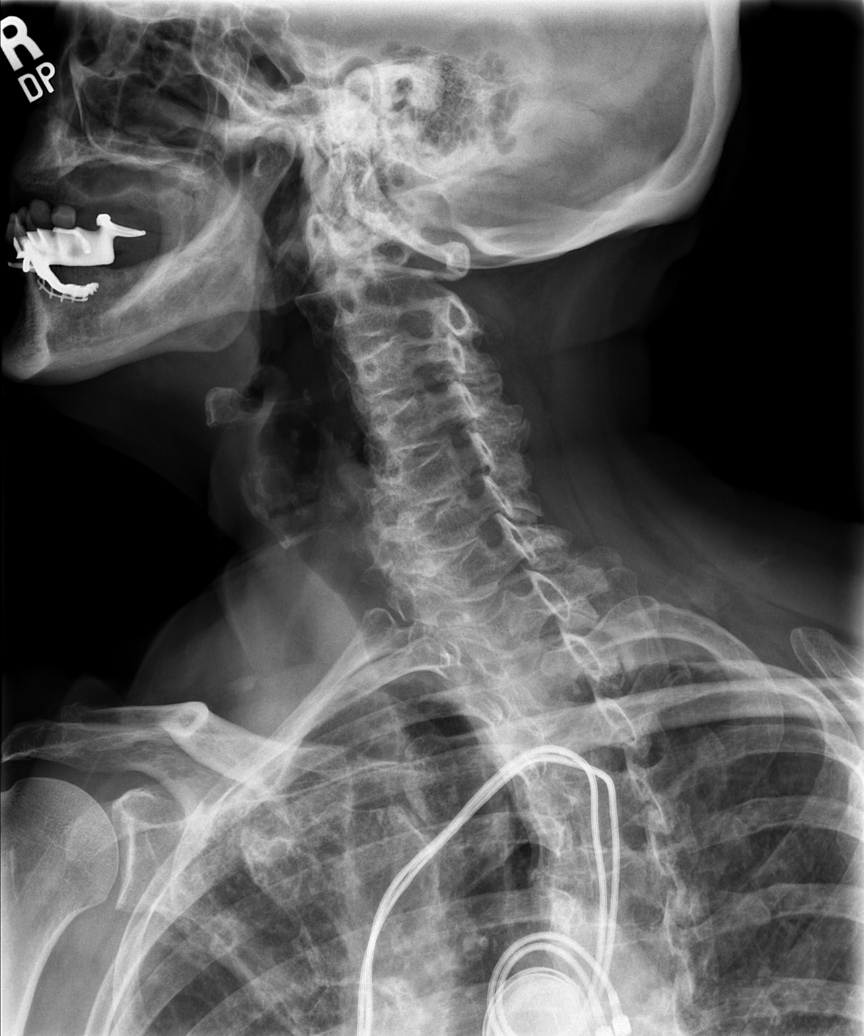

[w c-spine a.p. *]
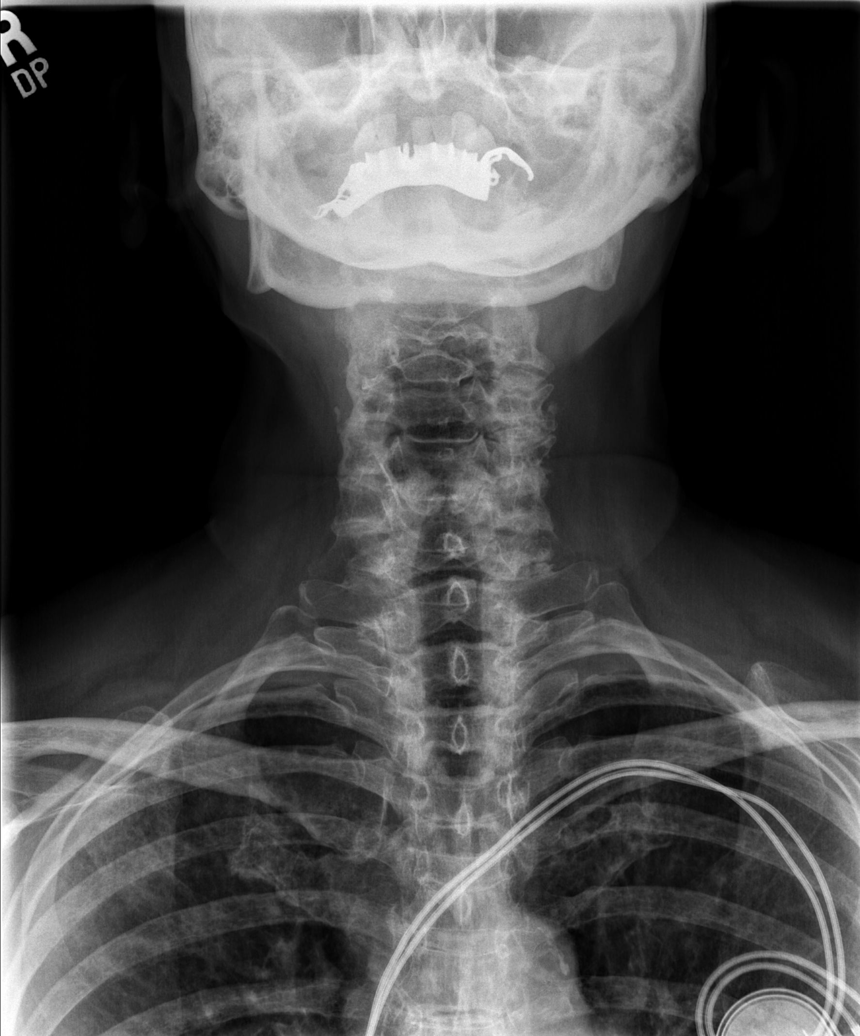

[w c-spine odontoid * (1 of 2)]
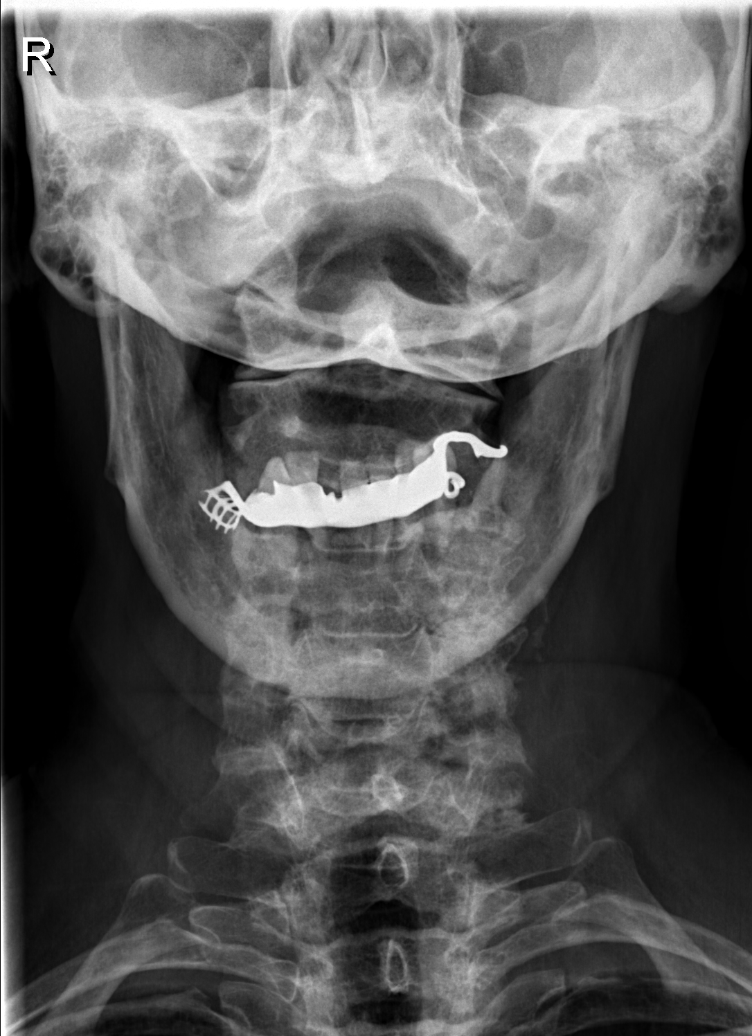

[w c-spine odontoid * (2 of 2)]
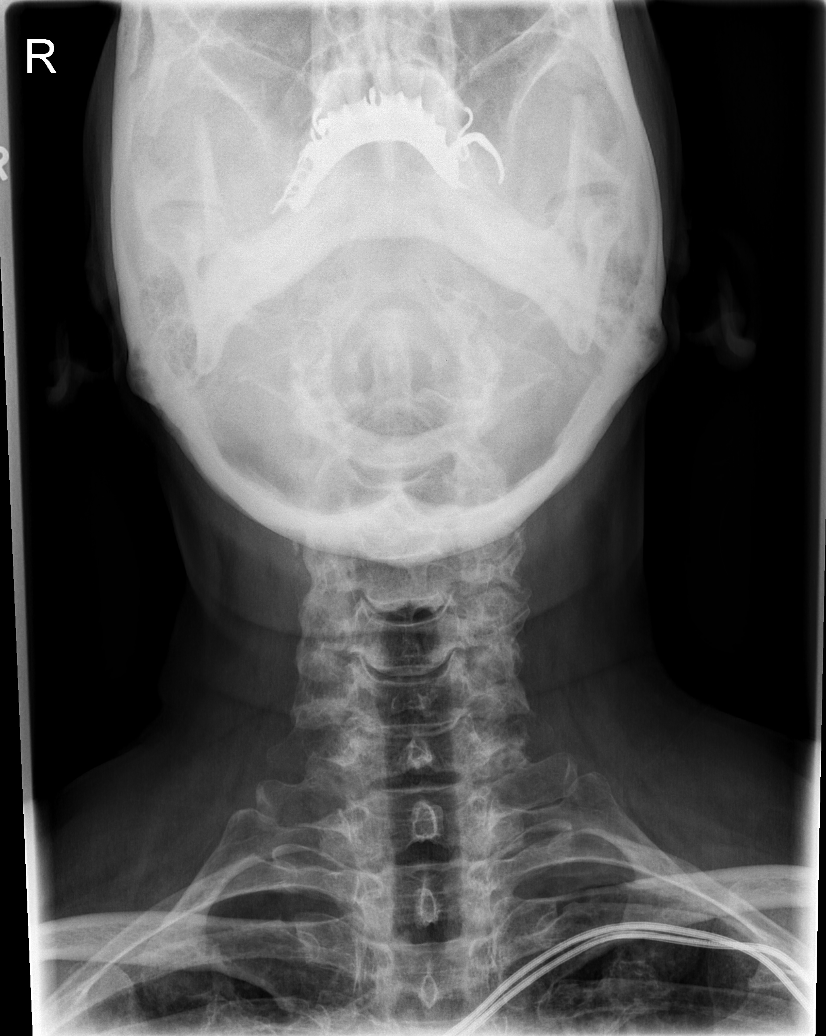

[6 of 6 positions shown; findings below may reference images not displayed]

FINDINGS: The cervical vertebrae are straightened in alignment.
There is degenerative disc disease particularly at C5-6 and C6-7
levels with loss of disc space and spurring.  No acute fracture is
seen.  No prevertebral soft tissue swelling is noted.  The bones
are osteopenic.  On oblique views there is some foraminal narrowing
at C5-6 and C6-7 bilaterally.  The odontoid process is intact.  The
lung apices are clear.
IMPRESSION: 1.  Straightened alignment with degenerative disc disease at C5-6
and C6-7.
2.  Some foraminal narrowing at C5-6 and C6-7.

## 2012-09-18 IMAGING — CR DG CHEST 2V
2 series · 2 of 2 positions shown · non-contrast
Comparison: Chest x-ray of 09/26/2010

CLINICAL DATA: Shortness of breath, cough, former smoker

CHEST - 2 VIEW

[view not recorded (1 of 2)]
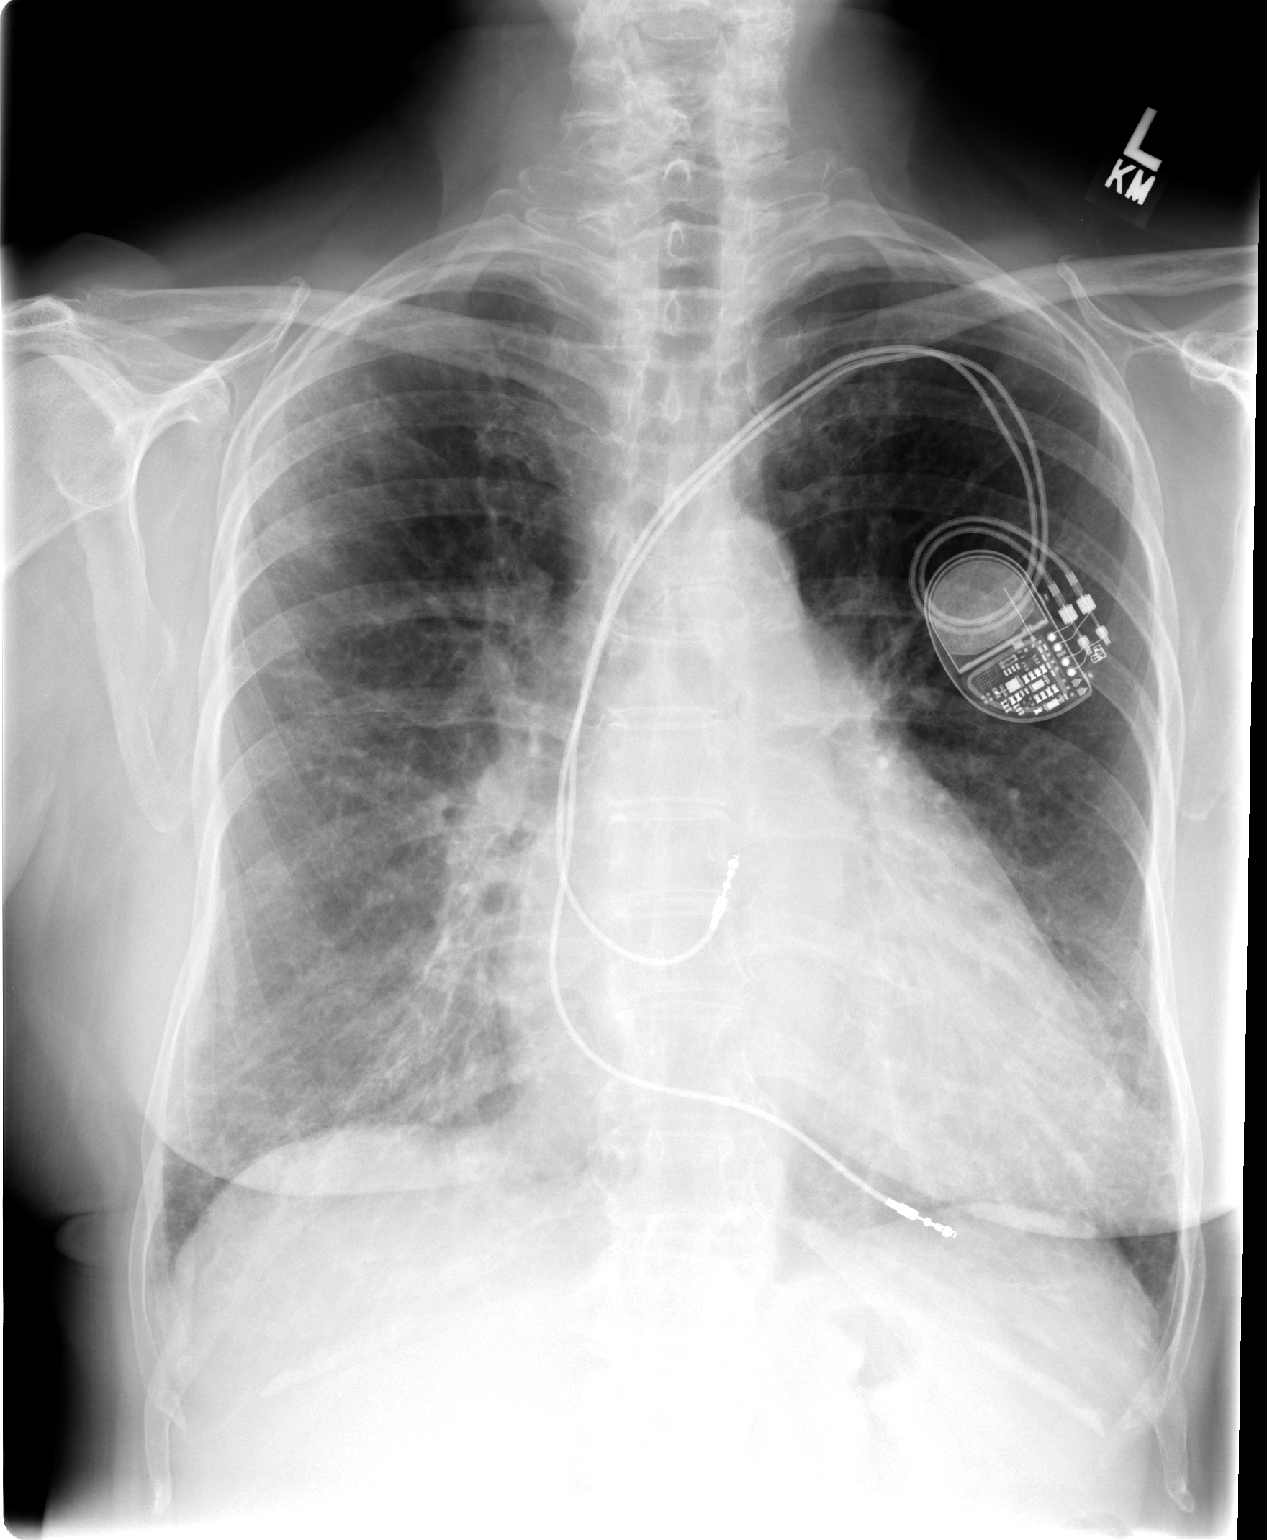

[view not recorded (2 of 2)]
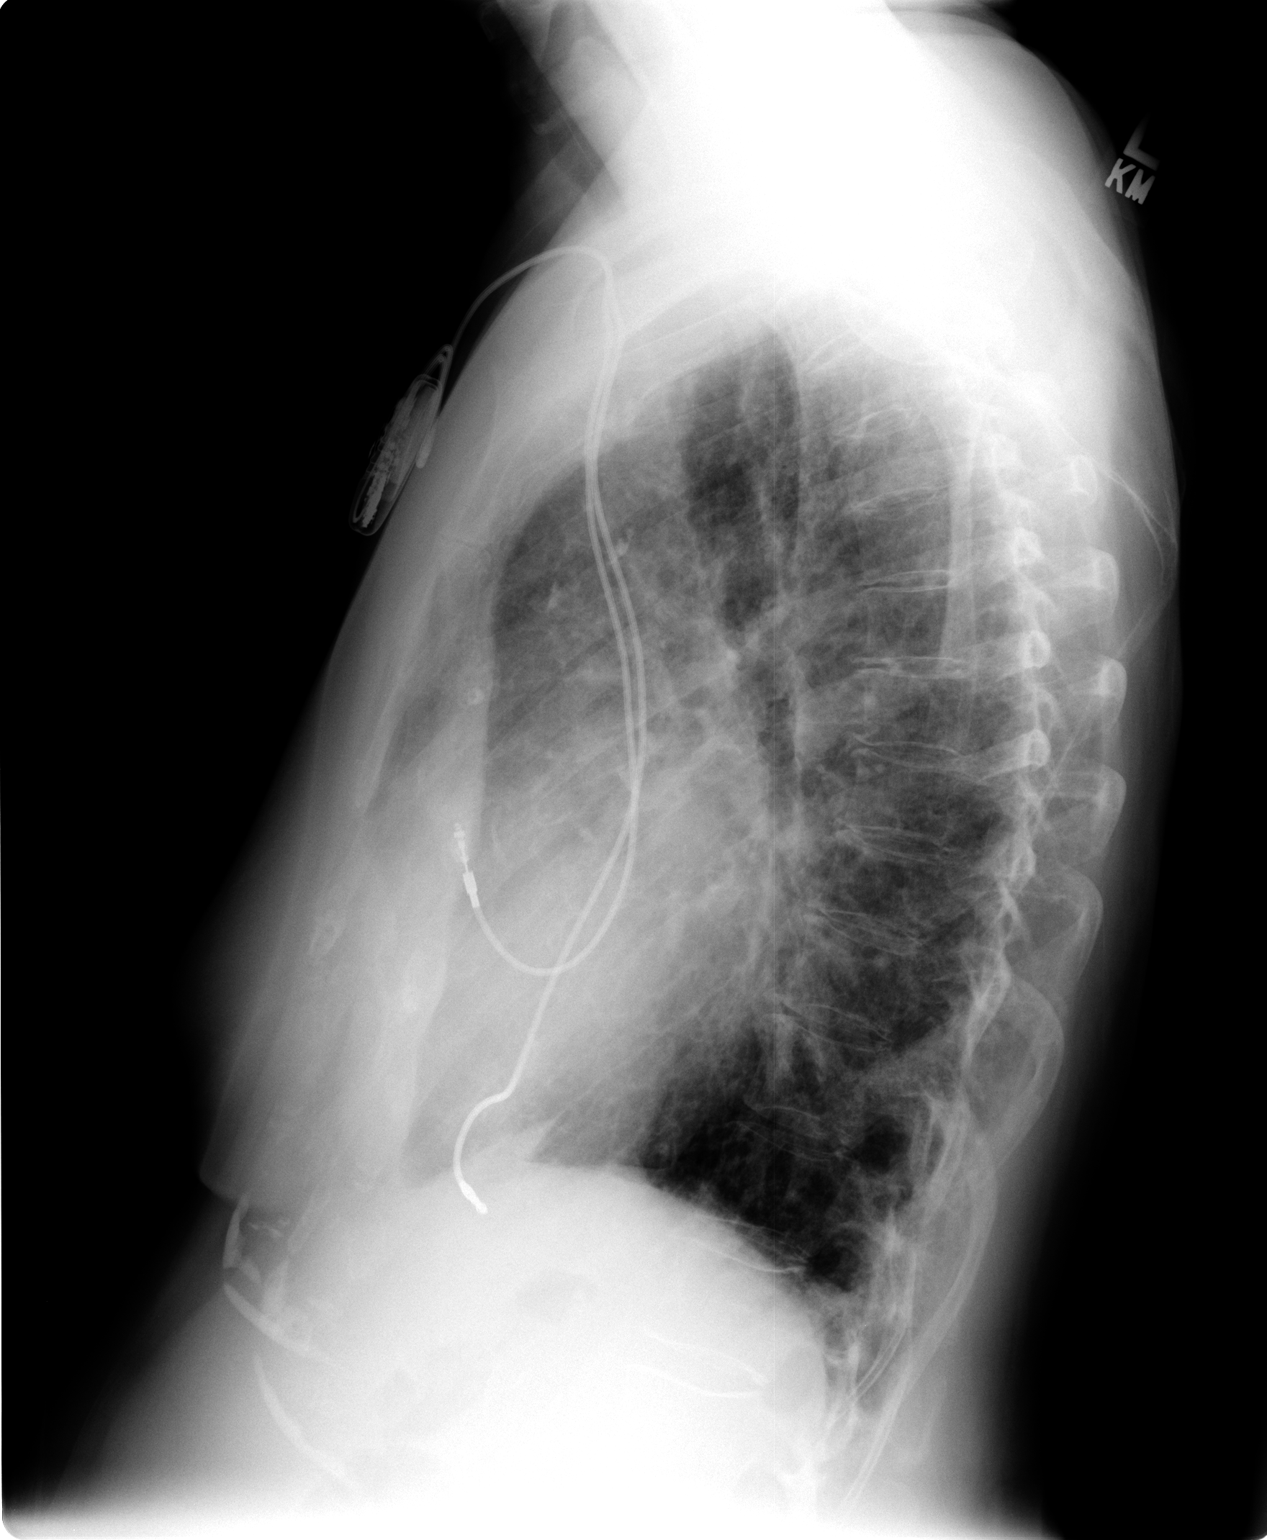

[2 of 2 positions shown; findings below may reference images not displayed]

FINDINGS: Somewhat prominent interstitial markings again are noted
perhaps somewhat more prominent in the right mid upper lung field
and right lung base.  These interstitial markings most likely are
chronic although superimposed edema or pneumonia particularly in
the right upper lung field peripherally cannot be excluded.  No
effusion is seen.  Cardiomegaly is stable.  A permanent pacemaker
remains.
IMPRESSION: Slightly more prominent interstitial markings in the right mid
upper lung field and possibly the right lung base.  These markings
do appear to be chronic, but superimposed mild edema or pneumonia
cannot be excluded.  Consider follow-up chest x-ray.

## 2012-09-28 ENCOUNTER — Encounter: Payer: Self-pay | Admitting: Cardiology

## 2012-09-28 ENCOUNTER — Ambulatory Visit (INDEPENDENT_AMBULATORY_CARE_PROVIDER_SITE_OTHER): Payer: Medicare Other | Admitting: Cardiology

## 2012-09-28 ENCOUNTER — Telehealth: Payer: Self-pay | Admitting: Cardiology

## 2012-09-28 ENCOUNTER — Ambulatory Visit (INDEPENDENT_AMBULATORY_CARE_PROVIDER_SITE_OTHER)
Admission: RE | Admit: 2012-09-28 | Discharge: 2012-09-28 | Disposition: A | Discharge status: Home / Self Care | Payer: Medicare Other | Source: Ambulatory Visit | Attending: Cardiology | Admitting: Cardiology

## 2012-09-28 VITALS — BP 104/52 | HR 67 | Ht 64.0 in | Wt 147.0 lb

## 2012-09-28 DIAGNOSIS — R0609 Other forms of dyspnea: Secondary | ICD-10-CM

## 2012-09-28 DIAGNOSIS — I421 Obstructive hypertrophic cardiomyopathy: Secondary | ICD-10-CM

## 2012-09-28 DIAGNOSIS — I4891 Unspecified atrial fibrillation: Secondary | ICD-10-CM

## 2012-09-28 DIAGNOSIS — R0600 Dyspnea, unspecified: Secondary | ICD-10-CM

## 2012-09-28 DIAGNOSIS — Z950 Presence of cardiac pacemaker: Secondary | ICD-10-CM

## 2012-09-28 DIAGNOSIS — R0989 Other specified symptoms and signs involving the circulatory and respiratory systems: Secondary | ICD-10-CM

## 2012-09-28 DIAGNOSIS — I422 Other hypertrophic cardiomyopathy: Secondary | ICD-10-CM

## 2012-09-28 DIAGNOSIS — R0602 Shortness of breath: Secondary | ICD-10-CM

## 2012-09-28 LAB — CBC WITH DIFFERENTIAL/PLATELET
Basophils Absolute: 0 10*3/uL (ref 0.0–0.1)
Eosinophils Relative: 3.4 % (ref 0.0–5.0)
HCT: 37.6 % (ref 36.0–46.0)
Lymphs Abs: 1.1 10*3/uL (ref 0.7–4.0)
MCV: 98.5 fl (ref 78.0–100.0)
Monocytes Absolute: 0.4 10*3/uL (ref 0.1–1.0)
Platelets: 237 10*3/uL (ref 150.0–400.0)
RDW: 14.8 % — ABNORMAL HIGH (ref 11.5–14.6)

## 2012-09-28 LAB — BASIC METABOLIC PANEL
Calcium: 9.2 mg/dL (ref 8.4–10.5)
GFR: 68.5 mL/min (ref >60.00)
Glucose, Bld: 138 mg/dL — ABNORMAL HIGH (ref 70–99)
Sodium: 137 mEq/L (ref 135–145)

## 2012-09-28 NOTE — Assessment & Plan Note (Signed)
The patient has a functioning ventricular pacemaker.  She has not had any dizzy spells or syncope.

## 2012-09-28 NOTE — Telephone Encounter (Signed)
Pt feeling weak, pt in a-fib, SOB, wants to see brackbill  today

## 2012-09-28 NOTE — Telephone Encounter (Signed)
Patient seen by  Dr. Brackbill today  

## 2012-09-28 NOTE — Patient Instructions (Addendum)
Will obtain labs today and call you with the results (cbc/bmet/bnp)  Will have you go for a chest xray ([place] building across from [employer])  Keep your regularly scheduled appointment .

## 2012-09-28 NOTE — Assessment & Plan Note (Signed)
The patient has a past history of hypertrophic cardiomyopathy and is on beta blocker and diltiazem.

## 2012-09-28 NOTE — Progress Notes (Signed)
[name] [name] Date of Birth:  12/29/1935 [facility] [address] [place], [place]  [address] [phone]         Fax   [phone]  History of Present Illness: This pleasant 76 year old woman is seen for a work in office visit.  she has a history of chronic established atrial fibrillation. She has a pacemaker in place. She has mild focal basal septal hypertrophy by echocardiogram 02/19/12. She has a history of poor balance weakness and frequent falls. She previously was on Coumadin which had to be stopped because of her fall risk and she now takes a daily 81 mg aspirin. The patient has also had problems with urinary frequency. She has difficulty taking the amount of Lasix that she needs to take for her dyspnea and pulmonary hypertension.  Presently she was on Lasix 40 mg daily.  Her weight is up 3 pounds.  She has had increased shortness of breath for the past 2-3 weeks.  Her last chest x-ray 02/12/12 showed cardiomegaly and early CHF  Current Outpatient Prescriptions  Medication Sig Dispense Refill  . Ascorbic Acid (VITAMIN C) 500 MG tablet Take 500 mg by mouth daily.       . bisoprolol (ZEBETA) 10 MG tablet Take 10 mg by mouth daily.       . calcium carbonate (OS-CAL) 600 MG TABS Take 600 mg by mouth daily.        . Cholecalciferol (VITAMIN D) 2000 UNITS CAPS Take 1 capsule by mouth daily.        [name] conjugated estrogens (PREMARIN) vaginal cream Place 0.625 g vaginally as needed. For vaginal dryness      . diltiazem (CARDIZEM CD) 120 MG 24 hr capsule Take 1 capsule (120 mg total) by mouth 2 (two) times daily.  180 capsule  3  . famotidine (PEPCID) 20 MG tablet Take 20 mg by mouth 2 (two) times daily as needed. For GERD      . furosemide (LASIX) 40 MG tablet Take 40 mg by mouth daily.       [name] gabapentin (NEURONTIN) 300 MG capsule Take 300-600 mg by mouth 4 (four) times daily. Takes 1 tablet in the am, 2 at lunch, 2 dinner and 2 at bedtime      . HYDROcodone-acetaminophen  (NORCO) 10-325 MG per tablet Take 1 tablet by mouth every 6 (six) hours as needed. pain      . LORazepam (ATIVAN) 0.5 MG tablet Take 0.5 mg by mouth every 6 (six) hours as needed. For anxiety      . losartan (COZAAR) 100 MG tablet TAKE 1 TABLET BY MOUTH EVERY DAY  30 tablet  11  . magnesium oxide (MAG-OX) 400 MG tablet Take 400 mg by mouth daily.       . metFORMIN (GLUCOPHAGE) 500 MG tablet Take 500 mg by mouth daily.       . mirtazapine (REMERON) 30 MG tablet Take 50 mg by mouth at bedtime.       . potassium chloride SA (K-DUR,KLOR-CON) 20 MEQ tablet Take 20 mEq by mouth 2 (two) times daily.       [name] PROAIR HFA 108 (90 BASE) MCG/ACT inhaler Inhale 2 puffs into the lungs as needed. For SOB      . sertraline (ZOLOFT) 100 MG tablet Take 100 mg by mouth daily.      . simvastatin (ZOCOR) 10 MG tablet Take 1 tablet (10 mg total) by mouth at bedtime.  90 tablet  3  .  temazepam (RESTORIL) 7.5 MG capsule Take 7.5 mg by mouth at bedtime as needed.        Allergies  Allergen Reactions  . Calan (Verapamil Hcl) Other (See Comments)    Patient states it makes her "out of her mind"; bp bottoms out  . Crestor (Rosuvastatin Calcium) Other (See Comments)    Muscle pain  . Escitalopram Oxalate Other (See Comments)    Unknown   . Lipitor (Atorvastatin Calcium) Other (See Comments)    myalgia  . Lopressor (Metoprolol Tartrate) Other (See Comments)    Blood pressure bottoms out   . Norvasc (Amlodipine Besylate) Other (See Comments)    fatigue  . Nsaids   . Sulfa Antibiotics Nausea Only and Other (See Comments)    Makes her stomach hurt  . Vimovo (Naproxen-Esomeprazole)   . Penicillins Hives and Rash    Patient Active Problem List  Diagnosis  . HYPERLIPIDEMIA  . DEPRESSIVE DISORDER NOT ELSEWHERE CLASSIFIED  . HYPERTENSION  . ALLERGIC RHINITIS  . CHRONIC OBSTRUCTIVE PULMONARY DISEASE, MODERATE  . SHORTNESS OF BREATH  . Cough  . Atrial fibrillation  . Pacemaker  . Hypertrophic cardiomyopathy    . Osteoarthritis  . Situational mixed anxiety and depressive disorder  . Hypercholesterolemia  . Mitral regurgitation  . Fall  . Diabetes mellitus  . Chest pain at rest  . TIA (transient ischemic attack)    History  Smoking status  . Former Smoker  . Types: Cigarettes  . Quit date: 11/15/2005  Smokeless tobacco  . Never Used    History  Alcohol Use No    Family History  Problem Relation Age of Onset  . Other Father     died in plane crash  . Cancer Mother     lymphoma-NHL  . Coronary artery disease Grandchild     Review of Systems: Constitutional: no fever chills diaphoresis or fatigue or change in weight.  Head and neck: no hearing loss, no epistaxis, no photophobia or visual disturbance. Respiratory: No cough, shortness of breath or wheezing. Cardiovascular: No chest pain peripheral edema, palpitations. Gastrointestinal: No abdominal distention, no abdominal pain, no change in bowel habits hematochezia or melena. Genitourinary: No dysuria, no frequency, no urgency, no nocturia. Musculoskeletal:No arthralgias, no back pain, no gait disturbance or myalgias. Neurological: No dizziness, no headaches, no numbness, no seizures, no syncope, no weakness, no tremors. Hematologic: No lymphadenopathy, no easy bruising. Psychiatric: No confusion, no hallucinations, no sleep disturbance.    Physical Exam: Filed Vitals:   09/28/12 1354  BP: 104/52  Pulse: 67   the general appearance reveals a well-developed well-nourished woman who appears anxious.  She appears to be slightly pale.The head and neck exam reveals pupils equal and reactive.  Extraocular movements are full.  There is no scleral icterus.  The mouth and pharynx are normal.  The neck is supple.  The carotids reveal no bruits.  The jugular venous pressure is normal.  The  thyroid is not enlarged.  There is no lymphadenopathy.  The chest is clear to percussion and auscultation.  There are no rales or rhonchi.   Expansion of the chest is symmetrical.  The precordium is quiet.  The first heart sound is normal.  The second heart sound is physiologically split.  There is a grade 1/6 systolic ejection murmur at the base. There is no abnormal lift or heave.  The abdomen is soft and nontender.  The bowel sounds are normal.  The liver and spleen are not enlarged.  There are  no abdominal masses.  There are no abdominal bruits.  Extremities reveal good pedal pulses.  There is no phlebitis or edema.  There is no cyanosis or clubbing.  Strength is normal and symmetrical in all extremities.  There is no lateralizing weakness.  There are no sensory deficits.  The skin is warm and dry.  There is no rash.  EKG today shows atrial fibrillation and a pattern of LVH with strain.  She also has frequent ventricular paced beats.  Strain pattern is actually improved since 06/30/12  Assessment / Plan: Today we're ordering a chest x-ray to look for signs of CHF.  She does not have any peripheral edema.  We are also checking blood work including CBC, basal metabolic panel, and B. natruretic peptide.  She will continue to take Lasix 40 mg each day but she will need to take an additional half tablet when dyspnea increases or her weight goes up several pounds. She will recheck here at her regular visit or sooner when necessary.  We will call her with the results of her chest x-ray and blood work.

## 2012-09-28 NOTE — Assessment & Plan Note (Signed)
The patient came in today because of increased shortness of breath for the past 2 or 3 weeks.  She was to have gone to the beach with friends today but she thought that she should not go because of her declining health.  She has been weak.  She has not had any evidence of gastrointestinal or genitourinary or urinary blood loss.  She has a cough productive of clear phlegm and she is not coughing up any purulent sputum.  She's had no chills or fever.  Her last chest x-ray 02/11/12 showed an enlarged heart and early CHF

## 2012-09-29 ENCOUNTER — Telehealth: Payer: Self-pay | Admitting: *Deleted

## 2012-09-29 MED ORDER — POTASSIUM CHLORIDE CRYS ER 20 MEQ PO TBCR: 20.0000 meq | EXTENDED_RELEASE_TABLET | Freq: Two times a day (BID) | ORAL | Status: DC

## 2012-09-29 NOTE — Telephone Encounter (Signed)
Message copied by Burnell Blanks on Tue Sep 29, 2012  2:33 PM ------      Message from: [name] [name]      Created: Tue Sep 29, 2012  2:03 PM       Please report.  The chest x-ray shows an enlarged heart and some signs of congestive heart failure.  There is no pneumonia.  Her blood test for congestive heart failure is elevated.  She will need to be on higher dose Lasix to improve her breathing.  Increase Lasix to 40 mg twice a day for the next 3 days and after that she can take 40 in the morning and 20 in the afternoon each day.   Her potassium level is low.  Has she been taking the potassium twice a day recently?  Increase the potassium to 3 times a day and eat potassium rich food.

## 2012-09-29 NOTE — Telephone Encounter (Signed)
Advised patient.  Patient has been taking her potassium about 2 x a week.  Discussed with Dawayne Patricia NP and will have patient take her potassium 3 x a day while on the Lasix 40 mg twice a day. Decrease potassium to twice a day when she decreases her Lasix to 40 mg in am and 20 mg pm.  Scheduled for labs 10/14/12

## 2012-09-29 NOTE — Telephone Encounter (Signed)
Agree with plan 

## 2012-09-29 NOTE — Telephone Encounter (Signed)
Message copied by Burnell Blanks on Tue Sep 29, 2012  4:01 PM ------      Message from: Cassell Clement      Created: Tue Sep 29, 2012  2:04 PM       We should plan to repeat a basal metabolic panel in one to 2 weeks

## 2012-10-02 ENCOUNTER — Telehealth: Payer: Self-pay | Admitting: Cardiology

## 2012-10-02 ENCOUNTER — Ambulatory Visit: Payer: Medicare Other | Admitting: Internal Medicine

## 2012-10-02 NOTE — Telephone Encounter (Signed)
Patient states taking 2 Furosemide a day makes her dizzy.  Patient has not checked blood pressure, will check and I will call her back

## 2012-10-02 NOTE — Telephone Encounter (Signed)
Patient did have a fall last night. Patient thinks she just lost her balance.  Patient states she did hit her head but feels ok, dizziness started before fall.

## 2012-10-02 NOTE — Telephone Encounter (Signed)
plz return call to pt [phone] regarding questions about medication

## 2012-10-02 NOTE — Telephone Encounter (Signed)
Blood pressure 104/52 standing and she took sitting 116/53. Breathing seems to be better.  Will forward to  Dr. Patty Sermons for review

## 2012-10-02 NOTE — Telephone Encounter (Signed)
Since her breathing is better and her blood pressure is okay she should stay on her same medication.

## 2012-10-02 NOTE — Telephone Encounter (Signed)
Spoke with patient and she would like to go back to 40 mg daily on Lasix. Discussed with  Dr. Patty Sermons and he is ok with that.  Advised patient to continue to weigh herself daily and if weight up 2-3 pounds to call, if shortness of breath worse to call

## 2012-10-07 NOTE — Addendum Note (Signed)
Addended by: Early Chars on: 10/07/2012 09:01 AM   Modules accepted: Orders

## 2012-10-14 ENCOUNTER — Other Ambulatory Visit (INDEPENDENT_AMBULATORY_CARE_PROVIDER_SITE_OTHER): Payer: Medicare Other

## 2012-10-14 DIAGNOSIS — I119 Hypertensive heart disease without heart failure: Secondary | ICD-10-CM

## 2012-10-14 DIAGNOSIS — E7800 Pure hypercholesterolemia, unspecified: Secondary | ICD-10-CM

## 2012-10-14 LAB — BASIC METABOLIC PANEL
BUN: 19 mg/dL (ref 6–23)
CO2: 28 mEq/L (ref 19–32)
Calcium: 9.6 mg/dL (ref 8.4–10.5)
Creatinine, Ser: 1 mg/dL (ref 0.4–1.2)
Glucose, Bld: 230 mg/dL — ABNORMAL HIGH (ref 70–99)

## 2012-10-14 LAB — HEPATIC FUNCTION PANEL
AST: 25 U/L (ref 0–37)
Albumin: 4.2 g/dL (ref 3.5–5.2)
Alkaline Phosphatase: 66 U/L (ref 39–117)
Bilirubin, Direct: 0.2 mg/dL (ref 0.0–0.3)

## 2012-10-14 LAB — LIPID PANEL
Total CHOL/HDL Ratio: 4
Triglycerides: 122 mg/dL (ref 0.0–149.0)

## 2012-10-15 NOTE — Progress Notes (Signed)
Quick Note:  Please report to patient. The recent labs are stable. Continue same medication and careful diet. Make copy for OV ______

## 2012-10-21 ENCOUNTER — Ambulatory Visit (INDEPENDENT_AMBULATORY_CARE_PROVIDER_SITE_OTHER): Payer: Medicare Other | Admitting: Cardiology

## 2012-10-21 ENCOUNTER — Encounter: Payer: Self-pay | Admitting: Cardiology

## 2012-10-21 VITALS — BP 140/62 | HR 64 | Resp 18 | Ht 64.0 in | Wt 148.4 lb

## 2012-10-21 DIAGNOSIS — F4323 Adjustment disorder with mixed anxiety and depressed mood: Secondary | ICD-10-CM

## 2012-10-21 DIAGNOSIS — I10 Essential (primary) hypertension: Secondary | ICD-10-CM

## 2012-10-21 DIAGNOSIS — I421 Obstructive hypertrophic cardiomyopathy: Secondary | ICD-10-CM

## 2012-10-21 DIAGNOSIS — I4891 Unspecified atrial fibrillation: Secondary | ICD-10-CM

## 2012-10-21 DIAGNOSIS — I119 Hypertensive heart disease without heart failure: Secondary | ICD-10-CM

## 2012-10-21 DIAGNOSIS — R002 Palpitations: Secondary | ICD-10-CM

## 2012-10-21 MED ORDER — BISOPROLOL FUMARATE 10 MG PO TABS: 10.0000 mg | ORAL_TABLET | Freq: Every day | ORAL | Status: DC

## 2012-10-21 NOTE — Assessment & Plan Note (Signed)
The patient has chronic underlying atrial fibrillation.  She has a pacemaker and her pulse is regular.  She is no longer on Coumadin because of frequent falls and head injuries.  She has not had any TIA symptoms

## 2012-10-21 NOTE — Assessment & Plan Note (Signed)
Blood pressure was remaining stable on current therapy.  The patient does have orthopnea and sleeps on 3-4 pillows.  She has nocturia 0-1 times per night.  She's had occasional chest pain

## 2012-10-21 NOTE — Progress Notes (Signed)
[name] [name] Date of Birth:  [birth year] [facility] [address] [place], [place]  [address] [phone]         Fax   [phone]  History of Present Illness: This pleasant 76 year old woman is seen for a scheduled four-month followup office visit.  She has a history of chronic atrial fibrillation. She has a pacemaker in place. She has mild focal basal septal hypertrophy by echocardiogram 02/19/12. She has a history of poor balance weakness and frequent falls. She previously was on Coumadin which had to be stopped because of her fall risk he now takes a daily 81 mg aspirin. The patient has also had problems with urinary frequency. She has difficulty taking the amount of Lasix that she needs to take for her dyspnea and pulmonary hypertension.  She has had a lot of problems with nasal congestion and has seen ENT specialist Dr. Jenne Pane who has recommended surgery.  However because this would require general anesthesia and the patient does not want to be put to sleep, she does not anticipate proceeding with surgery.   Current Outpatient Prescriptions  Medication Sig Dispense Refill  . Ascorbic Acid (VITAMIN C) 500 MG tablet Take 500 mg by mouth daily.       [name] aspirin 81 MG tablet Take 81 mg by mouth daily.      . bisoprolol (ZEBETA) 10 MG tablet Take 1 tablet (10 mg total) by mouth daily.  90 tablet  3  . calcium carbonate (OS-CAL) 600 MG TABS Take 600 mg by mouth daily.        . Cholecalciferol (VITAMIN D) 2000 UNITS CAPS Take 1 capsule by mouth daily.        [name] conjugated estrogens (PREMARIN) vaginal cream Place 0.625 g vaginally as needed. For vaginal dryness      . diltiazem (CARDIZEM CD) 120 MG 24 hr capsule Take 1 capsule (120 mg total) by mouth 2 (two) times daily.  180 capsule  3  . famotidine (PEPCID) 20 MG tablet Take 20 mg by mouth 2 (two) times daily as needed. For GERD      . furosemide (LASIX) 40 MG tablet Take 40 mg by mouth daily.       [name] gabapentin (NEURONTIN)  300 MG capsule Take 300-600 mg by mouth 4 (four) times daily. Takes 1 tablet in the am, 2 at lunch, 2 dinner and 2 at bedtime      . HYDROcodone-acetaminophen (NORCO) 10-325 MG per tablet Take 1 tablet by mouth every 6 (six) hours as needed. pain      . LORazepam (ATIVAN) 0.5 MG tablet Take 0.5 mg by mouth every 6 (six) hours as needed. For anxiety      . losartan (COZAAR) 100 MG tablet TAKE 1 TABLET BY MOUTH EVERY DAY  30 tablet  11  . magnesium oxide (MAG-OX) 400 MG tablet Take 400 mg by mouth daily.       . metFORMIN (GLUCOPHAGE) 500 MG tablet Take 500 mg by mouth daily.       . mirtazapine (REMERON) 30 MG tablet Take 50 mg by mouth at bedtime.       . potassium chloride SA (K-DUR,KLOR-CON) 20 MEQ tablet Take 1 tablet (20 mEq total) by mouth 2 (two) times daily.  180 tablet  3  . PROAIR HFA 108 (90 BASE) MCG/ACT inhaler Inhale 2 puffs into the lungs as needed. For SOB      . sertraline (ZOLOFT) 100 MG tablet Take 100 mg  by mouth daily.      . simvastatin (ZOCOR) 10 MG tablet Take 1 tablet (10 mg total) by mouth at bedtime.  90 tablet  3  . temazepam (RESTORIL) 7.5 MG capsule Take 7.5 mg by mouth at bedtime as needed.        Allergies  Allergen Reactions  . Calan (Verapamil Hcl) Other (See Comments)    Patient states it makes her "out of her mind"; bp bottoms out  . Crestor (Rosuvastatin Calcium) Other (See Comments)    Muscle pain  . Escitalopram Oxalate Other (See Comments)    Unknown   . Lipitor (Atorvastatin Calcium) Other (See Comments)    myalgia  . Lopressor (Metoprolol Tartrate) Other (See Comments)    Blood pressure bottoms out   . Norvasc (Amlodipine Besylate) Other (See Comments)    fatigue  . Nsaids   . Sulfa Antibiotics Nausea Only and Other (See Comments)    Makes her stomach hurt  . Vimovo (Naproxen-Esomeprazole)   . Penicillins Hives and Rash    Patient Active Problem List  Diagnosis  . HYPERLIPIDEMIA  . DEPRESSIVE DISORDER NOT ELSEWHERE CLASSIFIED  .  HYPERTENSION  . ALLERGIC RHINITIS  . CHRONIC OBSTRUCTIVE PULMONARY DISEASE, MODERATE  . SHORTNESS OF BREATH  . Cough  . Atrial fibrillation  . Pacemaker  . Hypertrophic cardiomyopathy  . Osteoarthritis  . Situational mixed anxiety and depressive disorder  . Hypercholesterolemia  . Mitral regurgitation  . Fall  . Diabetes mellitus  . Chest pain at rest  . TIA (transient ischemic attack)    History  Smoking status  . Former Smoker  . Types: Cigarettes  . Quit date: 11/15/2005  Smokeless tobacco  . Never Used    History  Alcohol Use No    Family History  Problem Relation Age of Onset  . Other Father     died in plane crash  . Cancer Mother     lymphoma-NHL  . Coronary artery disease Grandchild     Review of Systems: Constitutional: no fever chills diaphoresis or fatigue or change in weight.  Head and neck: no hearing loss, no epistaxis, no photophobia or visual disturbance. Respiratory: No cough, shortness of breath or wheezing. Cardiovascular: No chest pain peripheral edema, palpitations. Gastrointestinal: No abdominal distention, no abdominal pain, no change in bowel habits hematochezia or melena. Genitourinary: No dysuria, no frequency, no urgency, no nocturia. Musculoskeletal:No arthralgias, no back pain, no gait disturbance or myalgias. Neurological: No dizziness, no headaches, no numbness, no seizures, no syncope, no weakness, no tremors. Hematologic: No lymphadenopathy, no easy bruising. Psychiatric: No confusion, no hallucinations, no sleep disturbance.    Physical Exam: Filed Vitals:   10/21/12 1220  BP: 140/62  Pulse: 64  Resp: 18   general appearance reveals a well-developed well-nourished alert woman in no distress.  She is wearing nasal oxygen at 2 L a minute.  The head and neck exam reveals pupils equal and reactive.  Extraocular movements are full.  There is no scleral icterus.  The mouth and pharynx are normal.  The neck is supple.  The  carotids reveal no bruits.  The jugular venous pressure is normal.  The  thyroid is not enlarged.  There is no lymphadenopathy.  The chest is clear to percussion and auscultation.  There are no rales or rhonchi.  Expansion of the chest is symmetrical.  The precordium is quiet.  The first heart sound is normal.  The second heart sound is physiologically split.  There is grade  2/6 systolic ejection murmur at the base. There is no abnormal lift or heave.  The abdomen is soft and nontender.  The bowel sounds are normal.  The liver and spleen are not enlarged.  There are no abdominal masses.  There are no abdominal bruits.  Extremities reveal good pedal pulses.  There is no phlebitis or edema.  There is no cyanosis or clubbing.  Strength is normal and symmetrical in all extremities.  There is no lateralizing weakness.  There are no sensory deficits.  The skin is warm and dry.  There is no rash.     Assessment / Plan: Continue same medication.  She has not been taking a baby aspirin on a regular basis but I want her to do so.  She has an appointment to see her pulmonologist next week and she will talk to him about her chronic nasal stuffiness. Recheck here in 4 months for followup office visit EKG basal metabolic panel and CBC

## 2012-10-21 NOTE — Assessment & Plan Note (Signed)
Patient remained stable in terms of her chronic anxiety and depression.  If anything she may be a little improved in that regard.  Initially after her husband died she had severe reactive depression.

## 2012-10-21 NOTE — Patient Instructions (Signed)
Add a ASPIRIN 81 mg daily  Your physician wants you to follow-up in: 4 month ov You will receive a reminder letter in the mail two months in advance. If you don't receive a letter, please call our office to schedule the follow-up appointment.

## 2012-10-26 ENCOUNTER — Ambulatory Visit (INDEPENDENT_AMBULATORY_CARE_PROVIDER_SITE_OTHER): Payer: Medicare Other | Admitting: Licensed Clinical Social Worker

## 2012-10-26 DIAGNOSIS — F411 Generalized anxiety disorder: Secondary | ICD-10-CM

## 2012-10-26 DIAGNOSIS — F331 Major depressive disorder, recurrent, moderate: Secondary | ICD-10-CM

## 2012-10-27 ENCOUNTER — Emergency Department (HOSPITAL_COMMUNITY): Payer: Medicare Other

## 2012-10-27 ENCOUNTER — Emergency Department (HOSPITAL_COMMUNITY)
Admission: EM | Admit: 2012-10-27 | Discharge: 2012-10-27 | Disposition: A | Discharge status: Home / Self Care | Payer: Medicare Other | Attending: Emergency Medicine | Admitting: Emergency Medicine

## 2012-10-27 ENCOUNTER — Encounter (HOSPITAL_COMMUNITY): Payer: Self-pay | Admitting: *Deleted

## 2012-10-27 DIAGNOSIS — M129 Arthropathy, unspecified: Secondary | ICD-10-CM | POA: Insufficient documentation

## 2012-10-27 DIAGNOSIS — Z950 Presence of cardiac pacemaker: Secondary | ICD-10-CM | POA: Insufficient documentation

## 2012-10-27 DIAGNOSIS — Y9389 Activity, other specified: Secondary | ICD-10-CM | POA: Insufficient documentation

## 2012-10-27 DIAGNOSIS — S199XXA Unspecified injury of neck, initial encounter: Secondary | ICD-10-CM | POA: Insufficient documentation

## 2012-10-27 DIAGNOSIS — I422 Other hypertrophic cardiomyopathy: Secondary | ICD-10-CM | POA: Insufficient documentation

## 2012-10-27 DIAGNOSIS — I4891 Unspecified atrial fibrillation: Secondary | ICD-10-CM | POA: Insufficient documentation

## 2012-10-27 DIAGNOSIS — S0990XA Unspecified injury of head, initial encounter: Secondary | ICD-10-CM | POA: Insufficient documentation

## 2012-10-27 DIAGNOSIS — R42 Dizziness and giddiness: Secondary | ICD-10-CM | POA: Insufficient documentation

## 2012-10-27 DIAGNOSIS — I10 Essential (primary) hypertension: Secondary | ICD-10-CM | POA: Insufficient documentation

## 2012-10-27 DIAGNOSIS — E119 Type 2 diabetes mellitus without complications: Secondary | ICD-10-CM | POA: Insufficient documentation

## 2012-10-27 DIAGNOSIS — J449 Chronic obstructive pulmonary disease, unspecified: Secondary | ICD-10-CM | POA: Insufficient documentation

## 2012-10-27 DIAGNOSIS — W100XXA Fall (on)(from) escalator, initial encounter: Secondary | ICD-10-CM | POA: Insufficient documentation

## 2012-10-27 DIAGNOSIS — J4489 Other specified chronic obstructive pulmonary disease: Secondary | ICD-10-CM | POA: Insufficient documentation

## 2012-10-27 DIAGNOSIS — Z79899 Other long term (current) drug therapy: Secondary | ICD-10-CM | POA: Insufficient documentation

## 2012-10-27 DIAGNOSIS — Z7982 Long term (current) use of aspirin: Secondary | ICD-10-CM | POA: Insufficient documentation

## 2012-10-27 DIAGNOSIS — F341 Dysthymic disorder: Secondary | ICD-10-CM | POA: Insufficient documentation

## 2012-10-27 DIAGNOSIS — Z87891 Personal history of nicotine dependence: Secondary | ICD-10-CM | POA: Insufficient documentation

## 2012-10-27 DIAGNOSIS — Z9181 History of falling: Secondary | ICD-10-CM | POA: Insufficient documentation

## 2012-10-27 DIAGNOSIS — S0993XA Unspecified injury of face, initial encounter: Secondary | ICD-10-CM | POA: Insufficient documentation

## 2012-10-27 DIAGNOSIS — Y9289 Other specified places as the place of occurrence of the external cause: Secondary | ICD-10-CM | POA: Insufficient documentation

## 2012-10-27 DIAGNOSIS — Z8673 Personal history of transient ischemic attack (TIA), and cerebral infarction without residual deficits: Secondary | ICD-10-CM | POA: Insufficient documentation

## 2012-10-27 DIAGNOSIS — R110 Nausea: Secondary | ICD-10-CM | POA: Insufficient documentation

## 2012-10-27 DIAGNOSIS — E7800 Pure hypercholesterolemia, unspecified: Secondary | ICD-10-CM | POA: Insufficient documentation

## 2012-10-27 NOTE — ED Notes (Signed)
Pt states she fell on Friday down an escalator, went to orthopedics doctor who sent her here to have a CT scan. Having head/face pain, L shoulder/leg pain.

## 2012-10-27 NOTE — ED Provider Notes (Signed)
History     CSN: [id number]  Arrival date & time 10/27/12  1533   First MD Initiated Contact with Patient 10/27/12 1551      No chief complaint on file.   (Consider location/radiation/quality/duration/timing/severity/associated sxs/prior treatment) HPI  The patient presents with concerns of ongoing head pain, facial pain.  She had a mechanical fall 4 days ago.  She fell down an escalator, bouncing down and unknown number of steps.  No loss of consciousness.  Since that time she has had pain focally about the right posterior head, left anterior face.  The pain is sore, worse with palpation.  No relief with OTC medication.  No syncope, but the patient continues to complain of lightheadedness, mild confusion.  She denies visual changes, vomiting.  She denies ataxia or discoordination.  She also complains of pain in her left shoulder. The patient was seen by her orthopedist today, referred here for further evaluation of her head and face pain.  Past Medical History  Diagnosis Date  . Hypertrophic cardiomyopathy   . Hypertension   . Osteoarthritis   . Situational mixed anxiety and depressive disorder   . Hypercholesterolemia   . Atrial fibrillation     Chronic; No longer on coumadin  . Pacemaker   . Falls frequently     coumadin stopped  . Pulmonary HTN     has had prior right heart cath in 2010; was felt that most likely due to elevated left sided pressures and would not benefit from vasodilator therapy  . Diabetes mellitus   . Pacemaker   . Oxygen dependent   . COPD (chronic obstructive pulmonary disease)   . TIA (transient ischemic attack)     Past Surgical History  Procedure Date  . Knee arthroplasty 2011    right knee  . Tonsillectomy   . Appendectomy   . Cardiac catheterization   . Insert / replace / remove pacemaker   . Other surgical history     hysterectomy-bening    Family History  Problem Relation Age of Onset  . Other Father     died in plane crash  .  Cancer Mother     lymphoma-NHL  . Coronary artery disease Grandchild     History  Substance Use Topics  . Smoking status: Former Smoker    Types: Cigarettes    Quit date: 11/15/2005  . Smokeless tobacco: Never Used  . Alcohol Use: No    OB History    Grav Para Term Preterm Abortions TAB SAB Ect Mult Living                  Review of Systems  Constitutional:       Per HPI, otherwise negative  HENT:       Per HPI, otherwise negative  Eyes: Negative.   Respiratory:       Per HPI, otherwise negative  Cardiovascular:       Per HPI, otherwise negative  Gastrointestinal: Positive for nausea. Negative for vomiting.  Genitourinary:       Chronic incontinence  Musculoskeletal:       Per HPI, otherwise negative  Skin: Positive for wound.  Neurological: Positive for light-headedness and headaches. Negative for seizures, syncope, speech difficulty and weakness.  Hematological: Bruises/bleeds easily.    Allergies  Calan; Crestor; Escitalopram oxalate; Lipitor; Lopressor; Norvasc; Nsaids; Sulfa antibiotics; Vimovo; and Penicillins  Home Medications   Current Outpatient Rx  Name  Route  Sig  Dispense  Refill  . VITAMIN C  500 MG PO TABS   Oral   Take 500 mg by mouth daily.          . ASPIRIN 81 MG PO TABS   Oral   Take 81 mg by mouth daily.         [name] BISOPROLOL FUMARATE 10 MG PO TABS   Oral   Take 10 mg by mouth daily.         [name] CALCIUM CARBONATE 600 MG PO TABS   Oral   Take 600 mg by mouth daily.           [name] VITAMIN D 2000 UNITS PO CAPS   Oral   Take 1 capsule by mouth daily.           [name] ESTROGENS, CONJUGATED 0.625 MG/GM VA CREA   Vaginal   Place 0.625 g vaginally as needed. For vaginal dryness         . DILTIAZEM HCL ER COATED BEADS 120 MG PO CP24   Oral   Take 120 mg by mouth 2 (two) times daily.         [name] FAMOTIDINE 20 MG PO TABS   Oral   Take 20 mg by mouth 2 (two) times daily as needed. For GERD         . FUROSEMIDE 40 MG PO TABS    Oral   Take 40 mg by mouth daily.          [name] GABAPENTIN 300 MG PO CAPS   Oral   Take 300-600 mg by mouth 4 (four) times daily. Takes 1 tablet in the am, 2 at lunch, 2 dinner and 2 at bedtime         . HYDROCODONE-ACETAMINOPHEN 10-325 MG PO TABS   Oral   Take 1 tablet by mouth every 6 (six) hours as needed. pain         . LORAZEPAM 0.5 MG PO TABS   Oral   Take 0.5 mg by mouth every 6 (six) hours as needed. For anxiety         . LOSARTAN POTASSIUM 100 MG PO TABS   Oral   Take 100 mg by mouth daily.         [name] MAGNESIUM OXIDE 400 MG PO TABS   Oral   Take 400 mg by mouth daily.          [name] METFORMIN HCL 500 MG PO TABS   Oral   Take 500 mg by mouth daily.          [name] MIRTAZAPINE 30 MG PO TABS   Oral   Take 50 mg by mouth at bedtime.          [name] POTASSIUM CHLORIDE CRYS ER 20 MEQ PO TBCR   Oral   Take 20 mEq by mouth 2 (two) times daily.         [name] PROAIR HFA 108 (90 BASE) MCG/ACT IN AERS   Inhalation   Inhale 2 puffs into the lungs as needed. For SOB         . SERTRALINE HCL 100 MG PO TABS   Oral   Take 100 mg by mouth daily.         [name] SIMVASTATIN 10 MG PO TABS   Oral   Take 10 mg by mouth at bedtime.         [name] TEMAZEPAM 7.5 MG PO CAPS   Oral   Take 7.5 mg by mouth at bedtime as needed. Insomnia  BP 115/81  Pulse 62  Temp 97.3 F (36.3 C) (Oral)  Resp 18  SpO2 94%  Physical Exam  Nursing note and vitals reviewed. Constitutional: She is oriented to person, place, and time. She appears well-developed and well-nourished. No distress.  HENT:  Head: Normocephalic and atraumatic.    Eyes: Conjunctivae normal and EOM are normal.  Neck: Neck supple. No spinous process tenderness and no muscular tenderness present. No rigidity. Decreased range of motion present. No edema present.       No ttp.  Patient has chronic neck pain and specifies that there are no new changes.    Cardiovascular: Normal rate and regular rhythm.     Pulmonary/Chest: Effort normal and breath sounds normal. No stridor. No respiratory distress.  Abdominal: She exhibits no distension.  Musculoskeletal: She exhibits no edema.       Right hip: Normal.       Left hip: She exhibits tenderness. She exhibits normal range of motion and normal strength.  Neurological: She is alert and oriented to person, place, and time. No cranial nerve deficit.  Skin: Skin is warm and dry.  Psychiatric: She has a normal mood and affect.    ED Course  Procedures (including critical care time)  Labs Reviewed - No data to display No results found.   No diagnosis found.    MDM  This patient presents several days after a mechanical fall with persistent pain in her head, face, and with ongoing lightheadedness, disorientation.  Per the patient's orthopedist's request, CT scan was performed.  This was reassuring, and she was d/c to f/u w ortho.  Gerhard Munch, MD 10/27/12 1745

## 2012-10-28 ENCOUNTER — Encounter: Payer: Self-pay | Admitting: Internal Medicine

## 2012-10-28 ENCOUNTER — Ambulatory Visit (INDEPENDENT_AMBULATORY_CARE_PROVIDER_SITE_OTHER): Payer: Medicare Other | Admitting: Internal Medicine

## 2012-10-28 VITALS — BP 140/80 | HR 74 | Temp 97.7°F | Ht 64.0 in | Wt 150.4 lb

## 2012-10-28 DIAGNOSIS — J449 Chronic obstructive pulmonary disease, unspecified: Secondary | ICD-10-CM

## 2012-10-28 DIAGNOSIS — J4489 Other specified chronic obstructive pulmonary disease: Secondary | ICD-10-CM

## 2012-10-28 MED ORDER — FLUTICASONE PROPIONATE 50 MCG/ACT NA SUSP: 2.0000 | Freq: Every day | NASAL | Status: DC

## 2012-10-28 NOTE — Patient Instructions (Addendum)
#  COPD - - your breathing is worse because you are not on the right medications  - continue albuterol as needed  - continue daily oxygen  - start symbicort 80/4.5 2 puff twice daily - take samples, show technique - return in 1 month  - spirometry and CAT score at followup - will discuss rehab and alpha 1 at followup  

## 2012-10-28 NOTE — Progress Notes (Signed)
Subjective:    Patient ID: [name] [name], female    DOB: 04-06-1936, 76 y.o.   MRN: [id number]  HPI #ex-30pack smoker( quit 2007)   #h dyspnea y due to mitral reugurg with associated pulmonary hypertension (Rt heart Cath 08/28/2009) and copd and OA knees/deconditioning  - 2011: Desaturated to 87% with walking 2nd lap.  - mitral regurg not severe enough to warrant repair --pper dR brackbill Jan 2012  - referred pulm rehab Jan 2012   #Gold stage 2 COPD ([facility] July 2011 - Fev1 1.%L/75%, DLCO 9.5/50%) with exertional hyoxoemia   - spiriva intolrance 2011 - "jittery"  #Depresion   - 2011/06/12following husband death  #Cough due to ace inhibitor  - Apr 22, 2010   OV 10/28/2012  Almost 2 years since last visit. AT last visit given symbicort but she does not remember. When she returned home after this visit she called back saying she found the symbicort mdi at home and takes it prn. . Currently on albuterol pnr only and oyxgen.  She is not interested in rehab (never attended after my last referrral in Jan 22012). She is now reporting progressive dyspnea x 6 months. She is denying worsening of dyspnea over 1-2 weeks. There is no change in baseline cough, or sputum volume or color. Only issue is recent trip and fall from escalator few days ago. Soft tissue injyury only per chart review.   CAT COPD Symptom & Quality of Life Score (GSK trademark) 0 is no burden. 5 is highest burden 10/28/2012   Never Cough -> Cough all the time 2  No phlegm in chest -> Chest is full of phlegm 2  No chest tightness -> Chest feels very tight 3  No dyspnea for 1 flight stairs/hill -> Very dyspneic for 1 flight of stairs 5  No limitations for ADL at home -> Very limited with ADL at home 4  Confident leaving home -> Not at all confident leaving home 0  Sleep soundly -> Do not sleep soundly because of lung condition 3  Lots of Energy -> No energy at all   TOTAL Score (max 40)  19   Past, Family, Social reviewed: no  change since last visit    Review of Systems  Constitutional: Negative for fever and unexpected weight change.  HENT: Negative for ear pain, nosebleeds, congestion, sore throat, rhinorrhea, sneezing, trouble swallowing, dental problem, postnasal drip and sinus pressure.   Eyes: Negative for redness and itching.  Respiratory: Negative for cough, chest tightness, shortness of breath and wheezing.   Cardiovascular: Negative for palpitations and leg swelling.  Gastrointestinal: Negative for nausea and vomiting.  Genitourinary: Negative for dysuria.  Musculoskeletal: Negative for joint swelling.  Skin: Negative for rash.  Neurological: Negative for headaches.  Hematological: Does not bruise/bleed easily.  Psychiatric/Behavioral: Negative for dysphoric mood. The patient is not nervous/anxious.    Current outpatient prescriptions:Ascorbic Acid (VITAMIN C) 500 MG tablet, Take 500 mg by mouth daily. , Disp: , Rfl: ;  aspirin 81 MG tablet, Take 81 mg by mouth daily., Disp: , Rfl: ;  bisoprolol (ZEBETA) 10 MG tablet, Take 10 mg by mouth daily., Disp: , Rfl: ;  calcium carbonate (OS-CAL) 600 MG TABS, Take 600 mg by mouth daily.  , Disp: , Rfl: ;  Cholecalciferol (VITAMIN D) 2000 UNITS CAPS, Take 1 capsule by mouth daily.  , Disp: , Rfl:  conjugated estrogens (PREMARIN) vaginal cream, Place 0.625 g vaginally as needed. For vaginal dryness, Disp: , Rfl: ;  diltiazem (CARDIZEM CD) 120 MG 24 hr capsule, Take 120 mg by mouth 2 (two) times daily., Disp: , Rfl: ;  famotidine (PEPCID) 20 MG tablet, Take 20 mg by mouth 2 (two) times daily as needed. For GERD, Disp: , Rfl: ;  furosemide (LASIX) 40 MG tablet, Take 40 mg by mouth daily. , Disp: , Rfl:  gabapentin (NEURONTIN) 300 MG capsule, Take 300-600 mg by mouth 4 (four) times daily. Takes 1 tablet in the am, 2 at lunch, 2 dinner and 2 at bedtime, Disp: , Rfl: ;  HYDROcodone-acetaminophen (NORCO) 10-325 MG per tablet, Take 1 tablet by mouth every 6 (six) hours as  needed. pain, Disp: , Rfl: ;  LORazepam (ATIVAN) 0.5 MG tablet, Take 0.5 mg by mouth every 6 (six) hours as needed. For anxiety, Disp: , Rfl:  losartan (COZAAR) 100 MG tablet, Take 100 mg by mouth daily., Disp: , Rfl: ;  metFORMIN (GLUCOPHAGE) 500 MG tablet, Take 500 mg by mouth daily. , Disp: , Rfl: ;  mirtazapine (REMERON) 30 MG tablet, Take 50 mg by mouth at bedtime. , Disp: , Rfl: ;  potassium chloride SA (K-DUR,KLOR-CON) 20 MEQ tablet, Take 20 mEq by mouth 2 (two) times daily., Disp: , Rfl:  PROAIR HFA 108 (90 BASE) MCG/ACT inhaler, Inhale 2 puffs into the lungs as needed. For SOB, Disp: , Rfl: ;  sertraline (ZOLOFT) 100 MG tablet, Take 100 mg by mouth daily., Disp: , Rfl: ;  simvastatin (ZOCOR) 10 MG tablet, Take 10 mg by mouth at bedtime., Disp: , Rfl: ;  temazepam (RESTORIL) 7.5 MG capsule, Take 7.5 mg by mouth at bedtime as needed. Insomnia, Disp: , Rfl:       Objective:   Physical Exam Physical Exam  General: well developed, well nourished, in no acute distress  Head: normocephalic and atraumatic  Eyes: PERRLA/EOM intact; conjunctiva and sclera clear  Ears: TMs intact and clear with normal canals  Nose: no deformity, discharge, inflammation, or lesions  Mouth: no deformity or lesions  Neck: no masses, thyromegaly, or abnormal cervical nodes  Chest Wall: no deformities noted  Lungs: clear bilaterally to auscultation and percussion  Heart: regular rhythm, normal rate, and Grade 3/6 SEM loudest at apex -->axilla.  Abdomen: bowel sounds positive; abdomen soft and non-tender without masses, or organomegaly  Msk: no deformity or scoliosis noted with normal posture but gait is antalgic to due to knee pain  Pulses: pulses normal  Extremities: no clubbing, cyanosis, edema, or deformity noted  Neurologic: CN II-XII grossly intact with normal reflexes, coordination, muscle strength and tone  Skin: intact without lesions or rashes  Cervical Nodes: no significant adenopathy  Axillary Nodes: no  significant adenopathy  Psych: depressed affect. but less than before       Assessment & Plan:

## 2012-10-28 NOTE — Assessment & Plan Note (Signed)
#  COPD - - your breathing is worse because you are not on the right medications  - continue albuterol as needed  - continue daily oxygen  - start symbicort 80/4.5 2 puff twice daily - take samples, show technique - return in 1 month  - spirometry and CAT score at followup - will discuss rehab and alpha 1 at followup

## 2012-11-16 ENCOUNTER — Ambulatory Visit (INDEPENDENT_AMBULATORY_CARE_PROVIDER_SITE_OTHER): Payer: Medicare Other | Admitting: *Deleted

## 2012-11-16 DIAGNOSIS — I4891 Unspecified atrial fibrillation: Secondary | ICD-10-CM

## 2012-11-16 DIAGNOSIS — Z950 Presence of cardiac pacemaker: Secondary | ICD-10-CM

## 2012-11-19 LAB — REMOTE PACEMAKER DEVICE
BATTERY VOLTAGE: 2.8 V
BMOD-0003RV: 30
BMOD-0005RV: 95 {beats}/min
RV LEAD IMPEDENCE PM: 400 Ohm

## 2012-11-20 ENCOUNTER — Other Ambulatory Visit: Payer: Self-pay | Admitting: Emergency Medicine

## 2012-11-20 MED ORDER — SIMVASTATIN 10 MG PO TABS: 10.0000 mg | ORAL_TABLET | Freq: Every day | ORAL | Status: DC

## 2012-11-22 ENCOUNTER — Encounter (HOSPITAL_COMMUNITY): Payer: Self-pay | Admitting: *Deleted

## 2012-11-22 ENCOUNTER — Emergency Department (HOSPITAL_COMMUNITY): Payer: Medicare Other

## 2012-11-22 ENCOUNTER — Telehealth: Payer: Self-pay | Admitting: Cardiology

## 2012-11-22 ENCOUNTER — Emergency Department (HOSPITAL_COMMUNITY)
Admission: EM | Admit: 2012-11-22 | Discharge: 2012-11-22 | Disposition: A | Discharge status: Home / Self Care | Payer: Medicare Other | Attending: Emergency Medicine | Admitting: Emergency Medicine

## 2012-11-22 DIAGNOSIS — E7800 Pure hypercholesterolemia, unspecified: Secondary | ICD-10-CM | POA: Insufficient documentation

## 2012-11-22 DIAGNOSIS — Z9181 History of falling: Secondary | ICD-10-CM | POA: Insufficient documentation

## 2012-11-22 DIAGNOSIS — Z950 Presence of cardiac pacemaker: Secondary | ICD-10-CM | POA: Insufficient documentation

## 2012-11-22 DIAGNOSIS — I10 Essential (primary) hypertension: Secondary | ICD-10-CM | POA: Insufficient documentation

## 2012-11-22 DIAGNOSIS — J449 Chronic obstructive pulmonary disease, unspecified: Secondary | ICD-10-CM | POA: Insufficient documentation

## 2012-11-22 DIAGNOSIS — Z9889 Other specified postprocedural states: Secondary | ICD-10-CM | POA: Insufficient documentation

## 2012-11-22 DIAGNOSIS — F4323 Adjustment disorder with mixed anxiety and depressed mood: Secondary | ICD-10-CM | POA: Insufficient documentation

## 2012-11-22 DIAGNOSIS — E119 Type 2 diabetes mellitus without complications: Secondary | ICD-10-CM | POA: Insufficient documentation

## 2012-11-22 DIAGNOSIS — Z9981 Dependence on supplemental oxygen: Secondary | ICD-10-CM | POA: Insufficient documentation

## 2012-11-22 DIAGNOSIS — IMO0002 Reserved for concepts with insufficient information to code with codable children: Secondary | ICD-10-CM | POA: Insufficient documentation

## 2012-11-22 DIAGNOSIS — R0609 Other forms of dyspnea: Secondary | ICD-10-CM | POA: Insufficient documentation

## 2012-11-22 DIAGNOSIS — I509 Heart failure, unspecified: Secondary | ICD-10-CM

## 2012-11-22 DIAGNOSIS — R0600 Dyspnea, unspecified: Secondary | ICD-10-CM

## 2012-11-22 DIAGNOSIS — J4489 Other specified chronic obstructive pulmonary disease: Secondary | ICD-10-CM | POA: Insufficient documentation

## 2012-11-22 DIAGNOSIS — Z79899 Other long term (current) drug therapy: Secondary | ICD-10-CM | POA: Insufficient documentation

## 2012-11-22 DIAGNOSIS — I4891 Unspecified atrial fibrillation: Secondary | ICD-10-CM | POA: Insufficient documentation

## 2012-11-22 DIAGNOSIS — Z87891 Personal history of nicotine dependence: Secondary | ICD-10-CM | POA: Insufficient documentation

## 2012-11-22 DIAGNOSIS — R0989 Other specified symptoms and signs involving the circulatory and respiratory systems: Secondary | ICD-10-CM | POA: Insufficient documentation

## 2012-11-22 DIAGNOSIS — Z8673 Personal history of transient ischemic attack (TIA), and cerebral infarction without residual deficits: Secondary | ICD-10-CM | POA: Insufficient documentation

## 2012-11-22 LAB — BASIC METABOLIC PANEL
Calcium: 10.1 mg/dL (ref 8.4–10.5)
GFR calc non Af Amer: 83 mL/min — ABNORMAL LOW (ref >90)
Potassium: 3.7 mEq/L (ref 3.5–5.1)
Sodium: 137 mEq/L (ref 135–145)

## 2012-11-22 LAB — CBC
MCH: 32.2 pg (ref 26.0–34.0)
MCHC: 32.7 g/dL (ref 30.0–36.0)
Platelets: 173 10*3/uL (ref 150–400)
RBC: 4.32 MIL/uL (ref 3.87–5.11)

## 2012-11-22 LAB — POCT I-STAT TROPONIN I: Troponin i, poc: 0.01 ng/mL (ref 0.00–0.08)

## 2012-11-22 LAB — PRO B NATRIURETIC PEPTIDE: Pro B Natriuretic peptide (BNP): 1995 pg/mL — ABNORMAL HIGH (ref 0–125)

## 2012-11-22 NOTE — Telephone Encounter (Signed)
Returned call to Ms. Hoaglund. She reports progressive shortness of breath that worsened yesterday as well as orthopnea, swelling up to her abdomen and chest pressure. She has chronic dyspnea and is on 2L O2, but has not increased this recently. She reports medication compliance, but thinks she has been taking 20mg  Lasix daily (med list shows 40mg  daily). She lives alone and sounded distraught on the phone. I instructed her to either have someone bring her to the emergency room or call 911. She stated understanding.   Ester, PA-C 11/22/2012 1:07 PM

## 2012-11-22 NOTE — ED Notes (Signed)
Pt has copd and chronic sob. Reports becoming more sob since last night and having productive cough with white sputum. spo2 91% on 2L [place].

## 2012-11-22 NOTE — ED Provider Notes (Signed)
History     CSN: [id number]  Arrival date & time 11/22/12  1416   First MD Initiated Contact with Patient 11/22/12 1622      Chief Complaint  Patient presents with  . Shortness of Breath    (Consider location/radiation/quality/duration/timing/severity/associated sxs/prior treatment) HPI Comments: Patient with history of chf on home oxygen.  She presents with increasing shortness of breath for the past few days.  She denies any fevers, chills, or productive cough.  There is no chest pain.  According to the family, she "drinks water all day."    Patient is a 77 y.o. female presenting with shortness of breath. The history is provided by the patient.  Shortness of Breath  The current episode started today. The onset was gradual. The problem occurs continuously. Progression since onset: 2 days ago. The problem is moderate. Nothing relieves the symptoms. Nothing aggravates the symptoms. Associated symptoms include shortness of breath. Pertinent negatives include no cough and no wheezing.    Past Medical History  Diagnosis Date  . Hypertrophic cardiomyopathy   . Hypertension   . Osteoarthritis   . Situational mixed anxiety and depressive disorder   . Hypercholesterolemia   . Atrial fibrillation     Chronic; No longer on coumadin  . Pacemaker   . Falls frequently     coumadin stopped  . Pulmonary HTN     has had prior right heart cath in 2010; was felt that most likely due to elevated left sided pressures and would not benefit from vasodilator therapy  . Diabetes mellitus   . Pacemaker   . Oxygen dependent   . COPD (chronic obstructive pulmonary disease)   . TIA (transient ischemic attack)     Past Surgical History  Procedure Date  . Knee arthroplasty 2011    right knee  . Tonsillectomy   . Appendectomy   . Cardiac catheterization   . Insert / replace / remove pacemaker   . Other surgical history     hysterectomy-bening    Family History  Problem Relation Age of Onset    . Other Father     died in plane crash  . Cancer Mother     lymphoma-NHL  . Coronary artery disease Grandchild     History  Substance Use Topics  . Smoking status: Former Smoker -- 1.0 packs/day for 40 years    Types: Cigarettes    Quit date: 11/15/2005  . Smokeless tobacco: Never Used  . Alcohol Use: No    OB History    Grav Para Term Preterm Abortions TAB SAB Ect Mult Living                  Review of Systems  Respiratory: Positive for shortness of breath. Negative for cough and wheezing.   All other systems reviewed and are negative.    Allergies  Calan; Crestor; Escitalopram oxalate; Lipitor; Lopressor; Norvasc; Nsaids; Sulfa antibiotics; Vimovo; and Penicillins  Home Medications   Current Outpatient Rx  Name  Route  Sig  Dispense  Refill  . VITAMIN C 500 MG PO TABS   Oral   Take 500 mg by mouth daily.          . ASPIRIN 81 MG PO TABS   Oral   Take 81 mg by mouth daily.         [name] BISOPROLOL FUMARATE 10 MG PO TABS   Oral   Take 10 mg by mouth daily.         [name]  CALCIUM CARBONATE 600 MG PO TABS   Oral   Take 600 mg by mouth daily.           [name] VITAMIN D 2000 UNITS PO CAPS   Oral   Take 1 capsule by mouth daily.           [name] ESTROGENS, CONJUGATED 0.625 MG/GM VA CREA   Vaginal   Place 0.625 g vaginally as needed. For vaginal dryness         . DILTIAZEM HCL ER COATED BEADS 120 MG PO CP24   Oral   Take 120 mg by mouth 2 (two) times daily.         [name] FAMOTIDINE 20 MG PO TABS   Oral   Take 20 mg by mouth 2 (two) times daily as needed. For GERD         . FLUTICASONE PROPIONATE 50 MCG/ACT NA SUSP   Nasal   Place 2 sprays into the nose daily.   16 g   3   . FUROSEMIDE 40 MG PO TABS   Oral   Take 40 mg by mouth daily.          [name] GABAPENTIN 300 MG PO CAPS   Oral   Take 300-600 mg by mouth 4 (four) times daily. Takes 1 tablet in the am, 2 at lunch, 2 dinner and 2 at bedtime         . HYDROCODONE-ACETAMINOPHEN 10-325 MG PO TABS    Oral   Take 1 tablet by mouth every 6 (six) hours as needed. pain         . LORAZEPAM 0.5 MG PO TABS   Oral   Take 0.5 mg by mouth every 6 (six) hours as needed. For anxiety         . LOSARTAN POTASSIUM 100 MG PO TABS   Oral   Take 100 mg by mouth daily.         [name] METFORMIN HCL 500 MG PO TABS   Oral   Take 500 mg by mouth daily.          [name] MIRTAZAPINE 30 MG PO TABS   Oral   Take 50 mg by mouth at bedtime.          [name] POTASSIUM CHLORIDE CRYS ER 20 MEQ PO TBCR   Oral   Take 20 mEq by mouth 2 (two) times daily.         [name] PROAIR HFA 108 (90 BASE) MCG/ACT IN AERS   Inhalation   Inhale 2 puffs into the lungs as needed. For SOB         . SERTRALINE HCL 100 MG PO TABS   Oral   Take 100 mg by mouth daily.         [name] SIMVASTATIN 10 MG PO TABS   Oral   Take 1 tablet (10 mg total) by mouth at bedtime.   90 tablet   1   . TEMAZEPAM 7.5 MG PO CAPS   Oral   Take 7.5 mg by mouth at bedtime as needed. Insomnia           BP 170/81  Pulse 69  Temp 97.8 F (36.6 C) (Oral)  Resp 20  SpO2 91%  Physical Exam  Nursing note and vitals reviewed. Constitutional: She is oriented to person, place, and time. She appears well-developed and well-nourished. No distress.  HENT:  Head: Normocephalic and atraumatic.  Neck: Normal range of motion. Neck supple.  Cardiovascular: Normal  rate and regular rhythm.  Exam reveals no gallop and no friction rub.   No murmur heard. Pulmonary/Chest: Effort normal and breath sounds normal. No respiratory distress. She has no wheezes.  Abdominal: Soft. Bowel sounds are normal. She exhibits no distension. There is no tenderness.  Musculoskeletal: Normal range of motion.  Neurological: She is alert and oriented to person, place, and time.  Skin: Skin is warm and dry. She is not diaphoretic.    ED Course  Procedures (including critical care time)  Labs Reviewed  BASIC METABOLIC PANEL - Abnormal; Notable for the following:    Glucose,  Bld 236 (*)     GFR calc non Af Amer 83 (*)     All other components within normal limits  PRO B NATRIURETIC PEPTIDE - Abnormal; Notable for the following:    Pro B Natriuretic peptide (BNP) 1995.0 (*)     All other components within normal limits  CBC  POCT I-STAT TROPONIN I   Dg Chest 2 View  11/22/2012  *RADIOLOGY REPORT*  Clinical Data: Short of breath  CHEST - 2 VIEW  Comparison: 09/28/2012  Findings: There is a left chest wall pacer device with lead in the right atrial appendage and right ventricle.  The heart size appears moderately enlarged.  Chronic interstitial lung disease compatible with pulmonary fibrosis is again noted.  Airspace opacity within the right base appears increased from previous exam.  Similar appearance of chronic right upper lobe opacity.  IMPRESSION:  1.  Chronic lung disease with worsening aeration to the right base.   Original Report Authenticated By: Signa Kell, M.D.      No diagnosis found.   Date: 11/22/2012  Rate: 73  Rhythm: atrial fibrillation  QRS Axis: normal  Intervals: normal  ST/T Wave abnormalities: nonspecific T wave changes  Conduction Disutrbances:none  Narrative Interpretation:   Old EKG Reviewed: unchanged    MDM  The patient presents with increasing shortness of breath for the past several days.  She has chf and is on home oxygen.  The family reports that she consumes water all day long.  The workup reveals an elevated bnp but unchanged ekg and negative troponin.  The chest xray does not reveal a pneumonia.  I will advise her to increase the lasix, to return prn.         Geoffery Lyons, MD 11/22/12 [phone]

## 2012-11-24 ENCOUNTER — Ambulatory Visit (INDEPENDENT_AMBULATORY_CARE_PROVIDER_SITE_OTHER): Payer: Medicare Other | Admitting: Cardiology

## 2012-11-24 ENCOUNTER — Encounter: Payer: Self-pay | Admitting: Cardiology

## 2012-11-24 VITALS — BP 135/77 | HR 72 | Resp 18 | Ht 65.0 in | Wt 146.0 lb

## 2012-11-24 DIAGNOSIS — R0989 Other specified symptoms and signs involving the circulatory and respiratory systems: Secondary | ICD-10-CM

## 2012-11-24 DIAGNOSIS — I4891 Unspecified atrial fibrillation: Secondary | ICD-10-CM

## 2012-11-24 DIAGNOSIS — R0602 Shortness of breath: Secondary | ICD-10-CM

## 2012-11-24 DIAGNOSIS — I422 Other hypertrophic cardiomyopathy: Secondary | ICD-10-CM

## 2012-11-24 DIAGNOSIS — R0600 Dyspnea, unspecified: Secondary | ICD-10-CM

## 2012-11-24 NOTE — Assessment & Plan Note (Signed)
The patient remains in chronic atrial fibrillation.  She has a functioning ventricular pacemaker.  She is on daily 81 mg aspirin.  She has had no TIA symptoms.

## 2012-11-24 NOTE — Assessment & Plan Note (Signed)
The patient previously had been taking 40 or 60 mg of Lasix daily in divided dose.  She now is taking 80 mg total a day and her breathing is better.  Her edema has resolved.  In the past she has not been able to stay on high-dose Lasix indefinitely because it makes her progressively more weak.  We have instructed her to continue to over the next few days to take 80 mg daily and then if she begins to feel weak she will cut back to just 40 mg a day.  She also has a accurate scale at home that will help with daily weights to detect early fluid reaccumulation and she works with a Engineer, civil (consulting) from [employer] who helps her with her fluid management.

## 2012-11-24 NOTE — Assessment & Plan Note (Signed)
The patient has a history of hypertrophic cardiomyopathy.  She has been told to avoid becoming dehydrated because it will make her outflow obstruction worse.  She understands it is a fine Between dyspnea from excessive body fluid and outflow track obstruction from lack of adequate body fluid.

## 2012-11-24 NOTE — Patient Instructions (Addendum)
Your physician recommends that you continue on your current medications as directed. Please refer to the Current Medication list given to you today.  You will receive a letter or call about your appointment in April/May, if you do not call us

## 2012-11-24 NOTE — Progress Notes (Signed)
[name] Date of Birth:  04/18/1936 [facility] [address] [place], [place]  [address] [phone]         Fax   [phone]  History of Present Illness: This pleasant 77 year old woman is seen for a work in office visit.  She was recently seen in the emergency room because of worsening dyspnea which responded to increased dose of Lasix and  she did not have to be admitted. She has a history of chronic atrial fibrillation. She has a pacemaker in place. She has mild focal basal septal hypertrophy by echocardiogram 02/19/12.  Her ejection fraction was 60-65% and she had mild to moderate mitral regurgitation and moderate tricuspid regurgitation and biatrial enlargement and abnormal diastolic function with elevated left ventricular end-diastolic pressure .She has a history of poor balance weakness and frequent falls. She previously was on Coumadin which had to be stopped because of her fall risk.  She now takes a daily 81 mg aspirin. The patient has also had problems with urinary frequency. She has difficulty taking the amount of Lasix that she needs to take for her dyspnea and pulmonary hypertension.  She has had a lot of problems with nasal congestion and has seen ENT specialist Dr. Jenne Pane who has recommended surgery. However because this would require general anesthesia and the patient does not want to be put to sleep, she does not anticipate proceeding with surgery.   Current Outpatient Prescriptions  Medication Sig Dispense Refill  . Ascorbic Acid (VITAMIN C) 500 MG tablet Take 500 mg by mouth daily.       [name] aspirin 81 MG tablet Take 81 mg by mouth daily.      . bisoprolol (ZEBETA) 10 MG tablet Take 10 mg by mouth daily.      . calcium carbonate (OS-CAL) 600 MG TABS Take 600 mg by mouth daily.        . Cholecalciferol (VITAMIN D) 2000 UNITS CAPS Take 1 capsule by mouth daily.        [name] conjugated estrogens (PREMARIN) vaginal cream Place 0.625 g vaginally daily as needed.  For vaginal dryness      . diltiazem (CARDIZEM CD) 120 MG 24 hr capsule Take 120 mg by mouth 2 (two) times daily.      . famotidine (PEPCID) 20 MG tablet Take 20 mg by mouth 2 (two) times daily as needed. For GERD      . fluticasone (FLONASE) 50 MCG/ACT nasal spray Place 2 sprays into the nose daily.  16 g  3  . furosemide (LASIX) 40 MG tablet Take 40 mg by mouth daily.       [name] gabapentin (NEURONTIN) 300 MG capsule Take 300-600 mg by mouth 4 (four) times daily. Takes 1 tablet in the am, 2 at lunch, 2 dinner and 2 at bedtime      . HYDROcodone-acetaminophen (NORCO) 10-325 MG per tablet Take 1 tablet by mouth every 6 (six) hours as needed. For pain      . LORazepam (ATIVAN) 0.5 MG tablet Take 0.5 mg by mouth every 6 (six) hours as needed. For anxiety      . losartan (COZAAR) 100 MG tablet Take 100 mg by mouth daily.      . metFORMIN (GLUCOPHAGE) 500 MG tablet Take 500 mg by mouth daily.       . mirtazapine (REMERON) 30 MG tablet Take 50 mg by mouth at bedtime as needed. For insomnia      . potassium chloride SA (  K-DUR,KLOR-CON) 20 MEQ tablet Take 20 mEq by mouth 2 (two) times daily.      [name] PROAIR HFA 108 (90 BASE) MCG/ACT inhaler Inhale 2 puffs into the lungs every 6 (six) hours as needed. For shortness of breath/wheezing      . sertraline (ZOLOFT) 100 MG tablet Take 100 mg by mouth daily.      . simvastatin (ZOCOR) 10 MG tablet Take 1 tablet (10 mg total) by mouth at bedtime.  90 tablet  1  . temazepam (RESTORIL) 7.5 MG capsule Take 7.5 mg by mouth at bedtime as needed. Insomnia        Allergies  Allergen Reactions  . Calan (Verapamil Hcl) Other (See Comments)    Patient states it makes her "out of her mind"; bp bottoms out  . Crestor (Rosuvastatin Calcium) Other (See Comments)    Muscle pain  . Escitalopram Oxalate Other (See Comments)    Unknown   . Lipitor (Atorvastatin Calcium) Other (See Comments)    myalgia  . Lopressor (Metoprolol Tartrate) Other (See Comments)    Blood pressure  bottoms out   . Norvasc (Amlodipine Besylate) Other (See Comments)    fatigue  . Nsaids   . Sulfa Antibiotics Nausea Only and Other (See Comments)    Makes her stomach hurt  . Vimovo (Naproxen-Esomeprazole)   . Penicillins Hives and Rash    Patient Active Problem List  Diagnosis  . HYPERLIPIDEMIA  . DEPRESSIVE DISORDER NOT ELSEWHERE CLASSIFIED  . HYPERTENSION  . ALLERGIC RHINITIS  . CHRONIC OBSTRUCTIVE PULMONARY DISEASE, MODERATE  . SHORTNESS OF BREATH  . Cough  . Atrial fibrillation  . Pacemaker  . Hypertrophic cardiomyopathy  . Osteoarthritis  . Situational mixed anxiety and depressive disorder  . Hypercholesterolemia  . Mitral regurgitation  . Fall  . Diabetes mellitus  . Chest pain at rest  . TIA (transient ischemic attack)    History  Smoking status  . Former Smoker -- 1.0 packs/day for 40 years  . Types: Cigarettes  . Quit date: 11/15/2005  Smokeless tobacco  . Never Used    History  Alcohol Use No    Family History  Problem Relation Age of Onset  . Other Father     died in plane crash  . Cancer Mother     lymphoma-NHL  . Coronary artery disease Grandchild     Review of Systems: Constitutional: no fever chills diaphoresis or fatigue or change in weight.  Head and neck: no hearing loss, no epistaxis, no photophobia or visual disturbance. Respiratory: No cough, shortness of breath or wheezing. Cardiovascular: No chest pain peripheral edema, palpitations. Gastrointestinal: No abdominal distention, no abdominal pain, no change in bowel habits hematochezia or melena. Genitourinary: No dysuria, no frequency, no urgency, no nocturia. Musculoskeletal:No arthralgias, no back pain, no gait disturbance or myalgias. Neurological: No dizziness, no headaches, no numbness, no seizures, no syncope, no weakness, no tremors. Hematologic: No lymphadenopathy, no easy bruising. Psychiatric: No confusion, no hallucinations, no sleep disturbance.    Physical  Exam: Filed Vitals:   11/24/12 1613  BP: 135/77  Pulse: 72  Resp: 18   the general appearance reveals a well-developed well-nourished woman in no distress.the patient has nasal oxygen in place. The head and neck exam reveals pupils equal and reactive.  Extraocular movements are full.  There is no scleral icterus.  The mouth and pharynx are normal.  The neck is supple.  The carotids reveal no bruits.  The jugular venous pressure is normal.  The  thyroid is not enlarged.  There is no lymphadenopathy.  The chest is clear to percussion and auscultation.  There are no rales or rhonchi.  Expansion of the chest is symmetrical.  The precordium is quiet.  The first heart sound is normal.  The second heart sound is physiologically split.  There is no  gallop rub or click.  There is a soft aortic valve outflow murmur at the base. There is no abnormal lift or heave.  The abdomen is soft and nontender.  The bowel sounds are normal.  The liver and spleen are not enlarged.  There are no abdominal masses.  There are no abdominal bruits.  Extremities reveal good pedal pulses.  There is no phlebitis or edema.  There is no cyanosis or clubbing.  Strength is normal and symmetrical in all extremities.  There is no lateralizing weakness.  There are no sensory deficits.  The skin is warm and dry.  There is no rash.     Assessment / Plan: Continue same medication.  Keep her previously scheduled appointment.

## 2012-11-27 ENCOUNTER — Ambulatory Visit
Admission: RE | Admit: 2012-11-27 | Discharge: 2012-11-27 | Disposition: A | Discharge status: Home / Self Care | Payer: Medicare Other | Source: Ambulatory Visit | Attending: Family Medicine | Admitting: Family Medicine

## 2012-11-27 ENCOUNTER — Other Ambulatory Visit: Payer: Self-pay | Admitting: Family Medicine

## 2012-11-27 DIAGNOSIS — R109 Unspecified abdominal pain: Secondary | ICD-10-CM

## 2012-12-01 ENCOUNTER — Ambulatory Visit: Payer: Medicare Other | Admitting: Internal Medicine

## 2012-12-03 ENCOUNTER — Telehealth: Payer: Self-pay | Admitting: Internal Medicine

## 2012-12-03 MED ORDER — BUDESONIDE-FORMOTEROL FUMARATE 80-4.5 MCG/ACT IN AERO: 2.0000 | INHALATION_SPRAY | Freq: Two times a day (BID) | RESPIRATORY_TRACT | Status: DC

## 2012-12-03 NOTE — Telephone Encounter (Signed)
Refills have been sent, Shanda Bumps has made an appointment for the end of month for her to see Dr. Marchelle Gearing.

## 2012-12-03 NOTE — Telephone Encounter (Signed)
Yes it is okay to refill but please give the patient an appointment to followup in a few months

## 2012-12-03 NOTE — Telephone Encounter (Signed)
Pt last seen 12.18.13 by MR and given sample symbicort 80-4.27mcg.   Pt states that this worked well for her and would like a refill.   Was last seen 12.18.13, follow up in 1 month >> 1.31.14 @ 1015. MR please advise if okay to add the symbicort 80-4.5 to pt's med list and send refills, thanks.

## 2012-12-07 ENCOUNTER — Encounter: Payer: Self-pay | Admitting: *Deleted

## 2012-12-10 ENCOUNTER — Ambulatory Visit: Payer: Medicare Other | Admitting: Internal Medicine

## 2012-12-11 ENCOUNTER — Ambulatory Visit (INDEPENDENT_AMBULATORY_CARE_PROVIDER_SITE_OTHER): Payer: Medicare Other | Admitting: Internal Medicine

## 2012-12-11 ENCOUNTER — Other Ambulatory Visit (INDEPENDENT_AMBULATORY_CARE_PROVIDER_SITE_OTHER): Payer: Medicare Other

## 2012-12-11 ENCOUNTER — Telehealth: Payer: Self-pay | Admitting: Internal Medicine

## 2012-12-11 ENCOUNTER — Encounter: Payer: Self-pay | Admitting: Internal Medicine

## 2012-12-11 VITALS — BP 120/70 | HR 90 | Temp 97.7°F | Ht 67.0 in | Wt 151.0 lb

## 2012-12-11 DIAGNOSIS — J9610 Chronic respiratory failure, unspecified whether with hypoxia or hypercapnia: Secondary | ICD-10-CM

## 2012-12-11 DIAGNOSIS — R413 Other amnesia: Secondary | ICD-10-CM | POA: Insufficient documentation

## 2012-12-11 DIAGNOSIS — I509 Heart failure, unspecified: Secondary | ICD-10-CM

## 2012-12-11 DIAGNOSIS — R0600 Dyspnea, unspecified: Secondary | ICD-10-CM

## 2012-12-11 DIAGNOSIS — J449 Chronic obstructive pulmonary disease, unspecified: Secondary | ICD-10-CM

## 2012-12-11 DIAGNOSIS — R0609 Other forms of dyspnea: Secondary | ICD-10-CM

## 2012-12-11 LAB — BASIC METABOLIC PANEL
GFR: 70.35 mL/min (ref >60.00)
Potassium: 4 mEq/L (ref 3.5–5.1)
Sodium: 137 mEq/L (ref 135–145)

## 2012-12-11 LAB — CBC
MCHC: 33.6 g/dL (ref 30.0–36.0)
MCV: 97.2 fl (ref 78.0–100.0)
RDW: 14.1 % (ref 11.5–14.6)

## 2012-12-11 LAB — BRAIN NATRIURETIC PEPTIDE: Pro B Natriuretic peptide (BNP): 283 pg/mL — ABNORMAL HIGH (ref 0.0–100.0)

## 2012-12-11 MED ORDER — DOXYCYCLINE HYCLATE 100 MG PO TABS: 100.0000 mg | ORAL_TABLET | Freq: Two times a day (BID) | ORAL | Status: DC

## 2012-12-11 MED ORDER — PREDNISONE 10 MG PO TABS: ORAL_TABLET | ORAL | Status: DC

## 2012-12-11 NOTE — Progress Notes (Signed)
Subjective:    Patient ID: [name] [name], female    DOB: 11-Apr-1936, 77 y.o.   MRN: [id number]  HPI #ex-30pack smoker( quit 2007)   #h dyspnea y due to mitral reugurg with associated pulmonary hypertension (Rt heart Cath 08/28/2009) and copd and OA knees/deconditioning  - 2011: Desaturated to 87% with walking 2nd lap.  - mitral regurg not severe enough to warrant repair --pper dR brackbill Jan 2012  - referred pulm rehab Jan 2012   #Gold stage 2 COPD ([facility] July 2011 - Fev1 1.%L/75%, DLCO 9.5/50%) with exertional hyoxoemia   - spiriva intolrance 2011 - "jittery"  #Depresion   - 07/01/11following husband death  #Cough due to ace inhibitor  - May 11, 2010   OV 10/28/2012  Almost 2 years since last visit. AT last visit given symbicort but she does not remember. When she returned home after this visit she called back saying she found the symbicort mdi at home and takes it prn. . Currently on albuterol pnr only and oyxgen.  She is not interested in rehab (never attended after my last referrral in Jan 22012). She is now reporting progressive dyspnea x 6 months. She is denying worsening of dyspnea over 1-2 weeks. There is no change in baseline cough, or sputum volume or color. Only issue is recent trip and fall from escalator few days ago. Soft tissue injyury only per chart review.    Past, Family, Social reviewed: no change since last visit   #COPD -  - your breathing is worse because you are not on the right medications  - continue albuterol as needed  - continue daily oxygen  - start symbicort 80/4.5 2 puff twice daily - take samples, show technique  - return in 1 month  - spirometry and CAT score at followup  - will discuss rehab and alpha 1 at followup    OV 12/11/2012  Ms. Golay returns for routine followup. This is for COPD. Review of records I noted that 2 weeks ago she was admitted to the emergency department with worsening dyspnea and was diagnosed to have an exacerbation of  congestive heart failure with a BNP of 1900 is worse than baseline but no real change in chest x-ray. The decompensation was attributed to misuse of Lasix therapy. In the emergency department she responded to Lasix and was discharged. Since then she followed with Dr. Cassell Clement & increase Lasix regimen. Today at followup she told my certified medical assistant that for the last 3 days she's having increased shortness of breath, wheezing and chest tightness. She was noticed to desaturate to 70% after walking all the way from the parking lot to the office on 2 L oxygen. However to me she stated that she is at baseline health and she is compliant with all her medications. Her CAT COPD score is slightly worse than baseline  Of note, am very concerned about developing dementia. She seems to forget her inhalers and her regimens at last visit. She could not reliably tell us at last visit her medication regimen. Today she did not remember her emergency department visit in 2 weeks ago.  CAT COPD Symptom & Quality of Life Score (GSK trademark) 0 is no burden. 5 is highest burden 10/28/2012  12/11/2012   Never Cough -> Cough all the time 2 3  No phlegm in chest -> Chest is full of phlegm 2 3  No chest tightness -> Chest feels very tight 3 2  No dyspnea for  1 flight stairs/hill -> Very dyspneic for 1 flight of stairs 5 5  No limitations for ADL at home -> Very limited with ADL at home 4 2  Confident leaving home -> Not at all confident leaving home 0 3  Sleep soundly -> Do not sleep soundly because of lung condition 3 3  Lots of Energy -> No energy at all  3  TOTAL Score (max 40)  19 24    labs  Lab 12/11/12 1135  HGB 14.0  HCT 41.8  WBC 10.2  PLT 186.0    Lab 12/11/12 1135  NA 137  K 4.0  CL 98  CO2 31  GLUCOSE 327*  BUN 16  CREATININE 0.8  CALCIUM 9.8  MG --  PHOS --    Lab 12/11/12 1135  PROBNP 283.0*      Review of Systems  Constitutional: Negative for fever and unexpected  weight change.  HENT: Negative for ear pain, nosebleeds, congestion, sore throat, rhinorrhea, sneezing, trouble swallowing, dental problem, postnasal drip and sinus pressure.   Eyes: Negative for redness and itching.  Respiratory: Positive for cough, chest tightness, shortness of breath and wheezing.   Cardiovascular: Positive for leg swelling. Negative for palpitations.  Gastrointestinal: Negative for nausea and vomiting.  Genitourinary: Negative for dysuria.  Musculoskeletal: Negative for joint swelling.  Skin: Negative for rash.  Neurological: Negative for headaches.  Hematological: Does not bruise/bleed easily.  Psychiatric/Behavioral: Negative for dysphoric mood. The patient is not nervous/anxious.        Objective:   Physical Exam General: well developed, well nourished, in no acute distress  Head: normocephalic and atraumatic  Eyes: PERRLA/EOM intact; conjunctiva and sclera clear  Ears: TMs intact and clear with normal canals  Nose: no deformity, discharge, inflammation, or lesions  Mouth: no deformity or lesions  Neck: no masses, thyromegaly, or abnormal cervical nodes  Chest Wall: no deformities noted  Lungs: clear bilaterally to auscultation and percussion  Heart: regular rhythm, normal rate, and Grade 3/6 SEM loudest at apex -->axilla.  Abdomen: bowel sounds positive; abdomen soft and non-tender without masses, or organomegaly  Msk: no deformity or scoliosis noted with normal posture but gait is antalgic to due to knee pain  Pulses: pulses normal  Extremities: no clubbing, cyanosis, edema, or deformity noted  Neurologic: CN II-XII grossly intact with normal reflexes, coordination, muscle strength and tone  Skin: intact without lesions or rashes  Cervical Nodes: no significant adenopathy  Axillary Nodes: no significant adenopathy  Psych: depressed affect. but less than before          Assessment & Plan:

## 2012-12-11 NOTE — Assessment & Plan Note (Signed)
Been noticing memory changes in the last 2 visits in 2013 and 2014. Highlighted this to Dr. Cassell Clement I will bring this to the attention of her primary care physician Benita Stabile, MD

## 2012-12-11 NOTE — Telephone Encounter (Signed)
Pt aware of MR recommendations. Rx's have been sent in.

## 2012-12-11 NOTE — Telephone Encounter (Signed)
Dr. Patty Sermons, I saw her today in she seemed to desaturate a little more than usual. I checked a BNP and it was okay. I am treating her for COPD exacerbation. I did note that she seemed to forget her most recent emergency room visit and I be noticing multiple episodes of memory issues with medications. Am wondering about dementia in her. What brings to attention particularly because she lives alone in the [place] for long time.

## 2012-12-11 NOTE — Assessment & Plan Note (Signed)
Not really sure if this is COPD exacerbation. Most recently she was in the emergency room for congestive heart failure exacerbation. Since then the Lasix has been increased. Today the BNP is much lower. However I did notice that she desaturated a lot more than usual with exertion. She denied any respiratory worsening to me but did tell my certified medical assistant that she's been worse for the last 3 days. On exam his not wheezing and not consistent with COPD exacerbation. However in absence of an etiology I will  now treated as COPD exacerbation with antibiotics and prednisone. We'll see her back in a few weeks

## 2012-12-11 NOTE — Telephone Encounter (Signed)
Lab teests normal includi\ng improved heart failure blood tests but her suargars are high 300mg %. She should talk to pcpc Benita Stabile, MD about this asap  Please tell patient to   You have mild attack of copd called COPD exacerbation Please take doxycycline 100mg  twice daily after meals x 5 days; avoid sunlight Please take prednisone 40mg  once daily x 3 days, then 20mg  once daily x 3 days, then 10mg  once daily x 3 days, then 5mg  once dailyx 3 days and stop   Also, please fax results to pmd and inform their office of high sugars  Thanks  MR

## 2012-12-11 NOTE — Patient Instructions (Addendum)
Please do blood work to evaluate your kidney function and heart function Based on these results we might: Call in medications for possible worsening of COPD I will discuss with Dr. Patty Sermons as well as to why you are short of breath more than usual For now use 3 L of oxygen with exertion Followup 1 month with CAT score and spirometry

## 2012-12-12 NOTE — Telephone Encounter (Signed)
Please forward this message on to her PCP Dr. Lupe Carney.  He may wish to give her something for her memory.

## 2012-12-14 ENCOUNTER — Telehealth: Payer: Self-pay | Admitting: Internal Medicine

## 2012-12-14 NOTE — Telephone Encounter (Signed)
Reviewed MR's notes. He was treating questionable COPD AE. Unclear whether this is the active dx, clearly the side effects outweigh any benefit the pt is feeling. Please ask the patient to stop the prednisone altogether, finish the doxycycline. She needs to call our office to report if she is better and to insure that her breathing doesn't worsen with this change.

## 2012-12-14 NOTE — Telephone Encounter (Signed)
Sent message to Dr Clovis Riley as requested

## 2012-12-14 NOTE — Telephone Encounter (Signed)
Spoke with patient, made her aware of recs per RB as lsited below.  Verbalized understanding and nothing further needed at this time.

## 2012-12-14 NOTE — Telephone Encounter (Signed)
Pt was seen in the office on Fri., 12/11/12 by MR and given rx for Doxycycline and Prednisone. The pt started these medications on Sat., 12/12/12 and c/o bloating, nausea, severe acid reflux and diarrhea. She also says she feels extremely jittery and remebers that in the past she was always told to only take half of any Prednisone dosage.  She wants to know if she should continue on these medications or stop them. Pt states she's not even sure why they were prescribed. MR is off this afternoon and I will forward this msg to the doc of the day, Dr. Delton Coombes. Pls advise. Allergies  Allergen Reactions  . Calan (Verapamil Hcl) Other (See Comments)    Patient states it makes her "out of her mind"; bp bottoms out  . Crestor (Rosuvastatin Calcium) Other (See Comments)    Muscle pain  . Escitalopram Oxalate Other (See Comments)    Unknown   . Lipitor (Atorvastatin Calcium) Other (See Comments)    myalgia  . Lopressor (Metoprolol Tartrate) Other (See Comments)    Blood pressure bottoms out   . Norvasc (Amlodipine Besylate) Other (See Comments)    fatigue  . Nsaids   . Sulfa Antibiotics Nausea Only and Other (See Comments)    Makes her stomach hurt  . Vimovo (Naproxen-Esomeprazole)   . Penicillins Hives and Rash

## 2012-12-19 IMAGING — CT CT HEAD W/O CM
1 of 2 series · 15 of 30 positions shown, 19 images · non-contrast
Comparison: 03/22/2011

CLINICAL DATA: Trauma/fall yesterday, headache, left frontal
bruising

CT HEAD WITHOUT CONTRAST
TECHNIQUE: Contiguous axial images were obtained from the base of
the skull through the vertex without contrast.

[Series 3: recon 2: brain · axial · 0.47mm/px · z∈[+136,+275]mm · 15 of 80 slices shown, 19 images]
[im 5/80  brain]
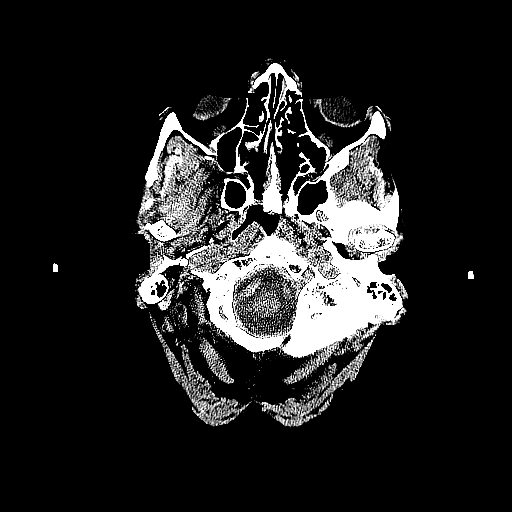
[im 5/80  bone]
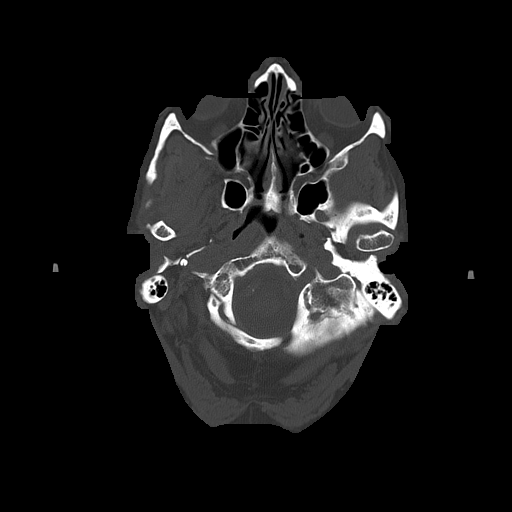
[im 9/80  brain]
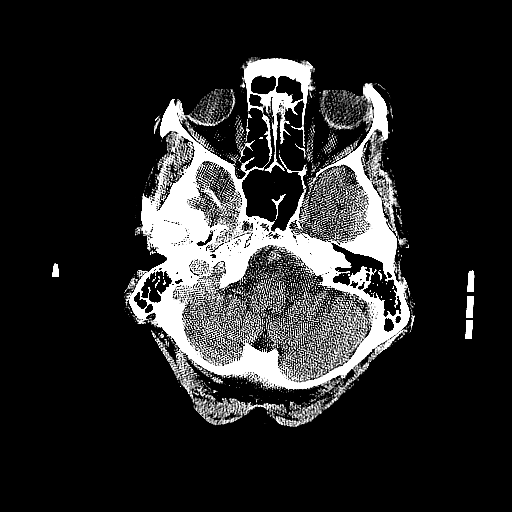
[im 17/80  brain]
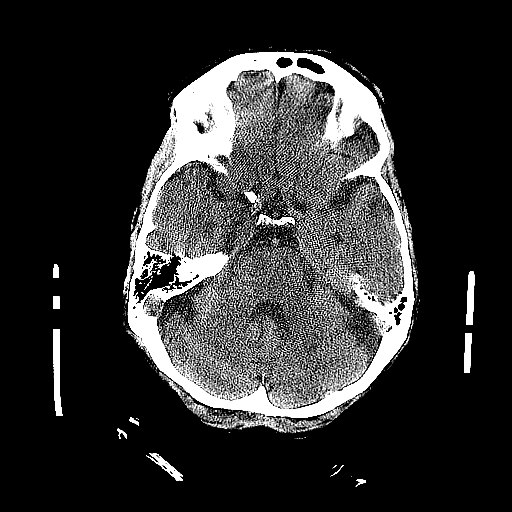
[im 21/80  brain]
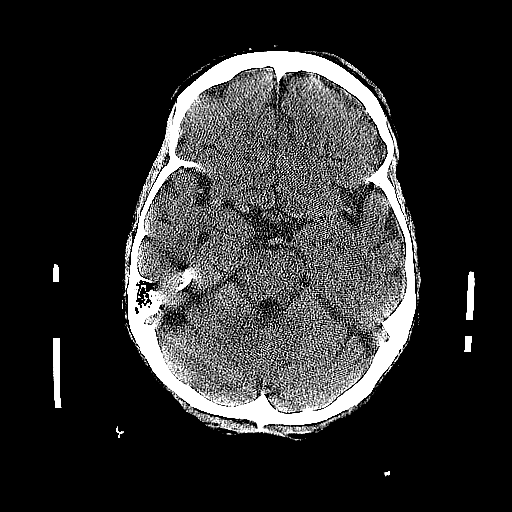
[im 25/80  brain]
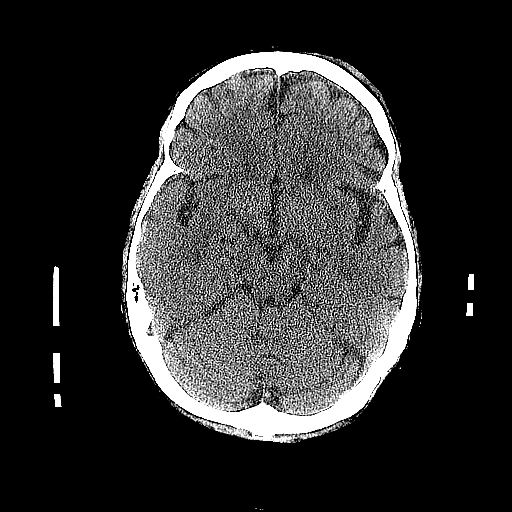
[im 25/80  bone]
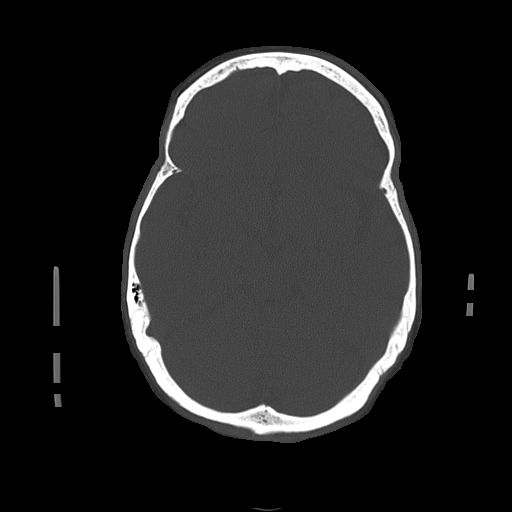
[im 30/80  brain]
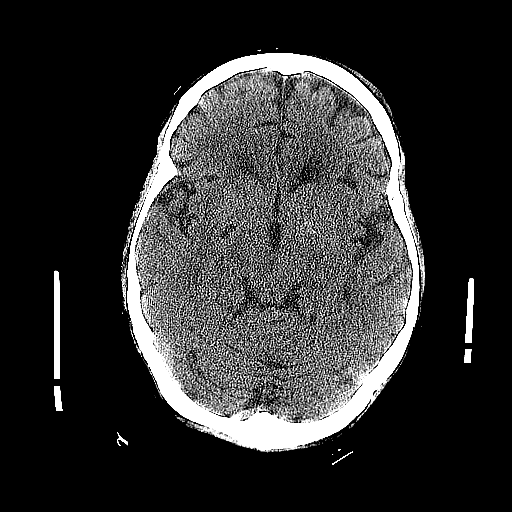
[im 34/80  brain]
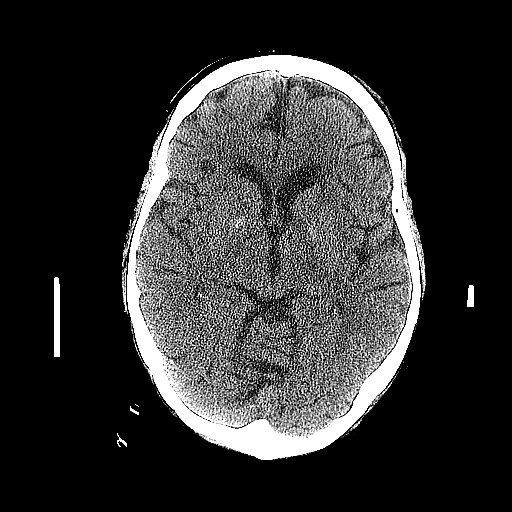
[im 42/80  brain]
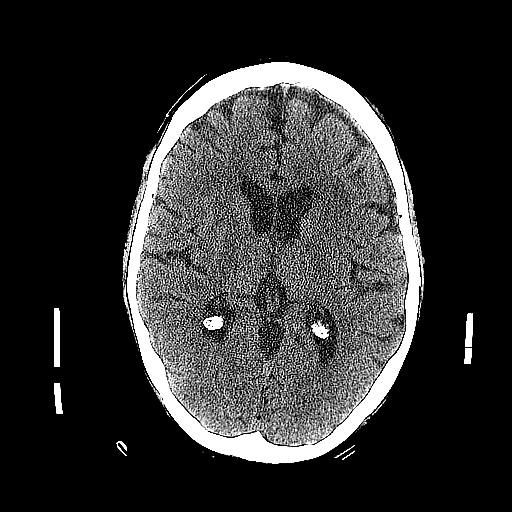
[im 46/80  brain]
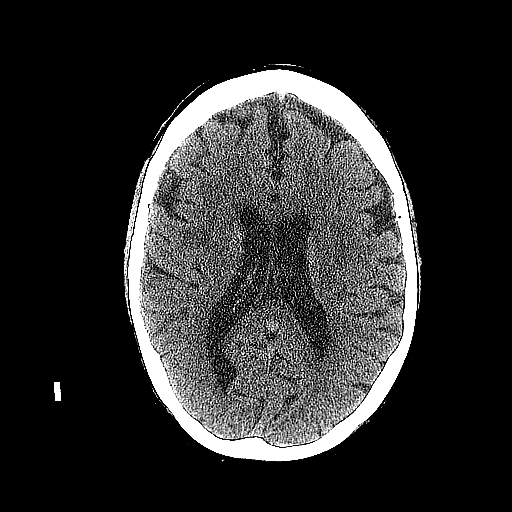
[im 46/80  bone]
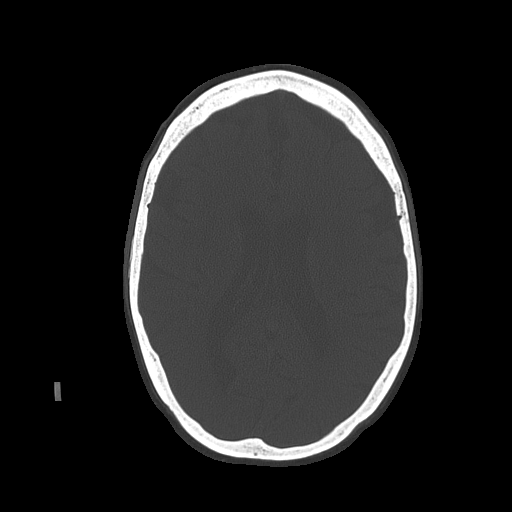
[im 50/80  brain]
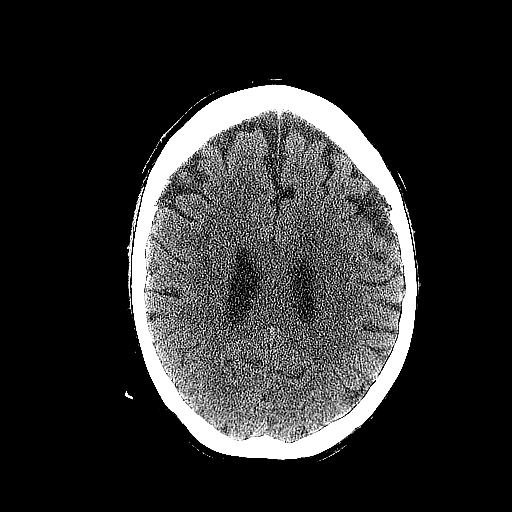
[im 55/80  brain]
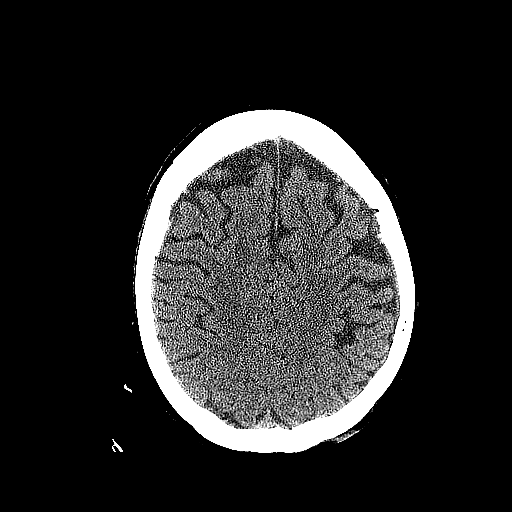
[im 59/80  brain]
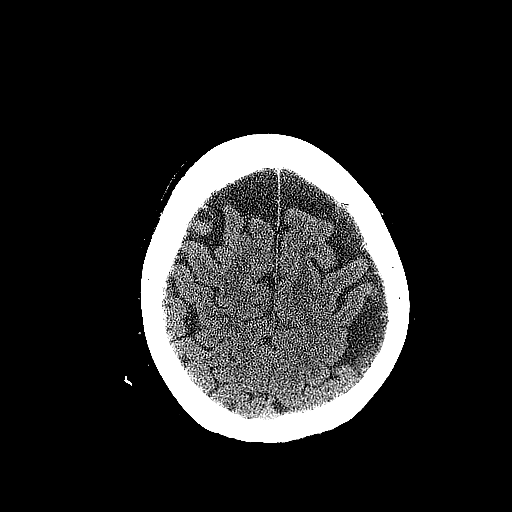
[im 67/80  brain]
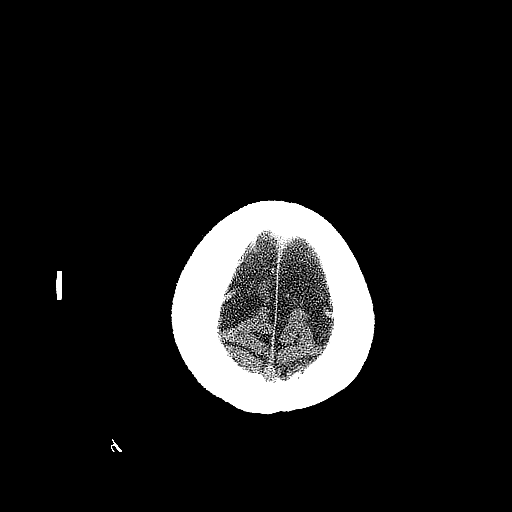
[im 67/80  bone]
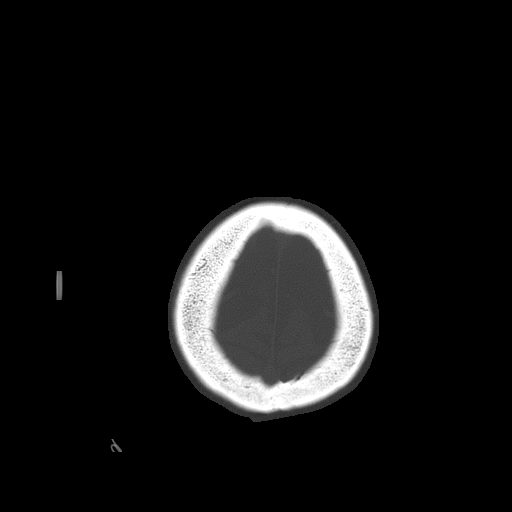
[im 71/80  brain]
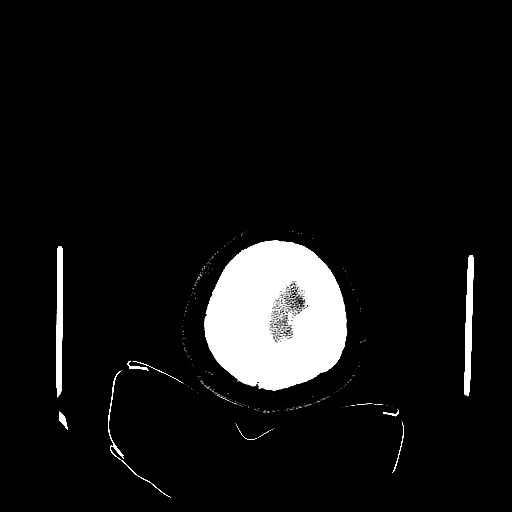
[im 75/80  brain]
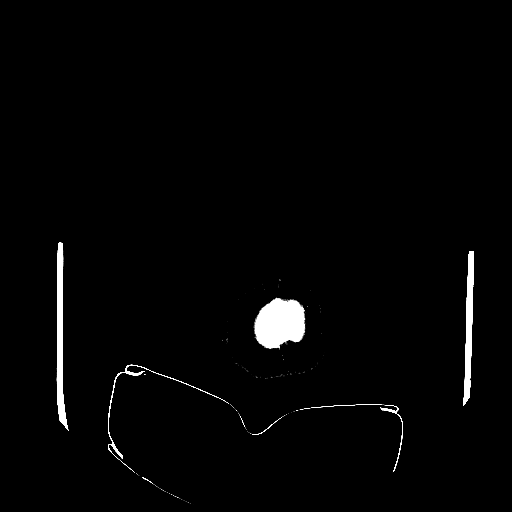

[15 of 30 positions shown; findings below may reference images not displayed]

FINDINGS: No evidence of parenchymal hemorrhage or extra-axial
fluid collection. No mass lesion, mass effect, or midline shift.

No CT evidence of acute infarction.

Subcortical white matter and periventricular small vessel ischemic
changes.  Intracranial atherosclerosis.

Mild global cortical atrophy.  No ventriculomegaly.

The visualized paranasal sinuses are essentially clear. The mastoid
air cells are unopacified.

Mild soft tissue swelling overlying the left frontal bone/lateral
orbit.  No underlying osseous abnormality.

No evidence of calvarial fracture.
IMPRESSION: Mild soft tissue swelling overlying the left frontal bone/lateral
orbit.

No evidence of calvarial fracture or acute intracranial
abnormality.

Atrophy with small vessel ischemic changes and intracranial
atherosclerosis.

## 2013-01-12 ENCOUNTER — Ambulatory Visit: Payer: Medicare Other | Admitting: Internal Medicine

## 2013-02-03 ENCOUNTER — Ambulatory Visit (INDEPENDENT_AMBULATORY_CARE_PROVIDER_SITE_OTHER): Payer: Medicare Other | Admitting: Internal Medicine

## 2013-02-03 ENCOUNTER — Encounter: Payer: Self-pay | Admitting: Internal Medicine

## 2013-02-03 VITALS — BP 122/70 | HR 70 | Temp 98.1°F | Ht 67.0 in | Wt 148.0 lb

## 2013-02-03 DIAGNOSIS — R413 Other amnesia: Secondary | ICD-10-CM

## 2013-02-03 DIAGNOSIS — J449 Chronic obstructive pulmonary disease, unspecified: Secondary | ICD-10-CM
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
## 2013-02-03 NOTE — Patient Instructions (Addendum)
COPD stable Continue oxygen and Symbicort I have referred you to pulmonary rehabilitation that should help both her shortness of breath and memory Return to see me in 6 months or sooner if needed 

## 2013-02-05 ENCOUNTER — Other Ambulatory Visit: Payer: Self-pay | Admitting: Physician Assistant

## 2013-02-05 ENCOUNTER — Ambulatory Visit
Admission: RE | Admit: 2013-02-05 | Discharge: 2013-02-05 | Disposition: A | Discharge status: Home / Self Care | Payer: Medicare Other | Source: Ambulatory Visit | Attending: Physician Assistant | Admitting: Physician Assistant

## 2013-02-05 DIAGNOSIS — M549 Dorsalgia, unspecified: Secondary | ICD-10-CM

## 2013-02-07 DIAGNOSIS — R413 Other amnesia: Secondary | ICD-10-CM | POA: Insufficient documentation

## 2013-02-07 NOTE — Assessment & Plan Note (Signed)
She seems to have progressive short term memory deficiit. Last visit I expressed concern with her cardiologist DR Patty Sermons. Both of [place] agreed this is a pcp issue. I spoke to PA in Benita Stabile, MD office and she said she will get [name] in for evaluation

## 2013-02-07 NOTE — Assessment & Plan Note (Signed)
COPD stable Continue oxygen and Symbicort I have referred you to pulmonary rehabilitation that should help both her shortness of breath and memory Return to see me in 6 months or sooner if needed

## 2013-02-08 ENCOUNTER — Other Ambulatory Visit: Payer: Self-pay | Admitting: Oncology

## 2013-02-10 ENCOUNTER — Other Ambulatory Visit: Payer: Self-pay | Admitting: *Deleted

## 2013-02-10 MED ORDER — FUROSEMIDE 40 MG PO TABS: 40.0000 mg | ORAL_TABLET | Freq: Two times a day (BID) | ORAL | Status: DC

## 2013-02-15 ENCOUNTER — Other Ambulatory Visit: Payer: Self-pay | Admitting: Oncology

## 2013-02-15 ENCOUNTER — Encounter: Payer: Self-pay | Admitting: Internal Medicine

## 2013-02-15 ENCOUNTER — Other Ambulatory Visit: Payer: Self-pay

## 2013-02-15 ENCOUNTER — Other Ambulatory Visit (INDEPENDENT_AMBULATORY_CARE_PROVIDER_SITE_OTHER): Payer: Medicare Other

## 2013-02-15 ENCOUNTER — Ambulatory Visit (INDEPENDENT_AMBULATORY_CARE_PROVIDER_SITE_OTHER): Payer: Medicare Other | Admitting: *Deleted

## 2013-02-15 DIAGNOSIS — I422 Other hypertrophic cardiomyopathy: Secondary | ICD-10-CM

## 2013-02-15 DIAGNOSIS — Z950 Presence of cardiac pacemaker: Secondary | ICD-10-CM

## 2013-02-15 DIAGNOSIS — R002 Palpitations: Secondary | ICD-10-CM

## 2013-02-15 DIAGNOSIS — I119 Hypertensive heart disease without heart failure: Secondary | ICD-10-CM

## 2013-02-15 LAB — BASIC METABOLIC PANEL
BUN: 16 mg/dL (ref 6–23)
CO2: 29 mEq/L (ref 19–32)
Chloride: 100 mEq/L (ref 96–112)
Creatinine, Ser: 1 mg/dL (ref 0.4–1.2)
Potassium: 4.1 mEq/L (ref 3.5–5.1)

## 2013-02-15 LAB — CBC WITH DIFFERENTIAL/PLATELET
Basophils Relative: 0.4 % (ref 0.0–3.0)
Eosinophils Absolute: 0.2 10*3/uL (ref 0.0–0.7)
HCT: 41 % (ref 36.0–46.0)
Lymphs Abs: 1.1 10*3/uL (ref 0.7–4.0)
MCHC: 33.3 g/dL (ref 30.0–36.0)
MCV: 97.3 fl (ref 78.0–100.0)
Monocytes Absolute: 0.4 10*3/uL (ref 0.1–1.0)
Neutrophils Relative %: 70.3 % (ref 43.0–77.0)
RBC: 4.21 Mil/uL (ref 3.87–5.11)

## 2013-02-15 NOTE — Progress Notes (Signed)
Quick Note:  Please make copy of labs for patient visit. ______ 

## 2013-02-18 ENCOUNTER — Ambulatory Visit (INDEPENDENT_AMBULATORY_CARE_PROVIDER_SITE_OTHER): Payer: Medicare Other | Admitting: Cardiology

## 2013-02-18 ENCOUNTER — Encounter: Payer: Self-pay | Admitting: Cardiology

## 2013-02-18 VITALS — BP 124/62 | HR 62 | Ht 64.0 in | Wt 145.8 lb

## 2013-02-18 DIAGNOSIS — IMO0001 Reserved for inherently not codable concepts without codable children: Secondary | ICD-10-CM

## 2013-02-18 DIAGNOSIS — I2789 Other specified pulmonary heart diseases: Secondary | ICD-10-CM

## 2013-02-18 DIAGNOSIS — G459 Transient cerebral ischemic attack, unspecified: Secondary | ICD-10-CM

## 2013-02-18 DIAGNOSIS — I272 Pulmonary hypertension, unspecified: Secondary | ICD-10-CM

## 2013-02-18 DIAGNOSIS — I4891 Unspecified atrial fibrillation: Secondary | ICD-10-CM

## 2013-02-18 DIAGNOSIS — R0602 Shortness of breath: Secondary | ICD-10-CM

## 2013-02-18 NOTE — Progress Notes (Signed)
[name] [name] Date of Birth:  10/05/1936 [facility] [address] [place], [place]  [address] [phone]         Fax   [phone]  History of Present Illness: This pleasant 77 year old woman is seen for a work in office visit. She was recently seen in the emergency room because of worsening dyspnea which responded to increased dose of Lasix and she did not have to be admitted. She has a history of chronic atrial fibrillation. She has a pacemaker in place. She has mild focal basal septal hypertrophy by echocardiogram 02/19/12. Her ejection fraction was 60-65% and she had mild to moderate mitral regurgitation and moderate tricuspid regurgitation and biatrial enlargement and abnormal diastolic function with elevated left ventricular end-diastolic pressure.  She has a history of pulmonary hypertension and her chest x-ray shows pulmonary fibrosis .She has a history of poor balance weakness and frequent falls. She previously was on Coumadin which had to be stopped because of her fall risk. She now takes a daily 81 mg aspirin. The patient has also had problems with urinary frequency. She has difficulty taking the amount of Lasix that she needs to take for her dyspnea and pulmonary hypertension.  She has had a lot of problems with nasal congestion and has seen ENT specialist Dr. Jenne Pane who has recommended surgery. However because this would require general anesthesia and the patient does not want to be put to sleep, she does not anticipate proceeding with surgery. She has chronic back pain and is followed by Dr. Vear Clock at the pain center   Current Outpatient Prescriptions  Medication Sig Dispense Refill  . Ascorbic Acid (VITAMIN C) 500 MG tablet Take 500 mg by mouth daily.       [name] aspirin 81 MG tablet Take 81 mg by mouth daily.      . bisoprolol (ZEBETA) 10 MG tablet Take 10 mg by mouth daily.      . budesonide-formoterol (SYMBICORT) 80-4.5 MCG/ACT inhaler Inhale 2 puffs into the  lungs 2 (two) times daily.  1 Inhaler  2  . BUPROPION HCL PO Take by mouth daily.      . calcium carbonate (OS-CAL) 600 MG TABS Take 600 mg by mouth daily.        . Cholecalciferol (VITAMIN D) 2000 UNITS CAPS Take 1 capsule by mouth daily.        [name] conjugated estrogens (PREMARIN) vaginal cream Place 0.625 g vaginally daily as needed. For vaginal dryness      . diltiazem (CARDIZEM CD) 120 MG 24 hr capsule Take 120 mg by mouth 2 (two) times daily.      . famotidine (PEPCID) 20 MG tablet Take 20 mg by mouth 2 (two) times daily as needed. For GERD      . fluticasone (FLONASE) 50 MCG/ACT nasal spray Place 2 sprays into the nose daily.  16 g  3  . furosemide (LASIX) 40 MG tablet Take 1 tablet (40 mg total) by mouth 2 (two) times daily.  60 tablet  5  . gabapentin (NEURONTIN) 300 MG capsule Take 300-600 mg by mouth 4 (four) times daily. Takes 1 tablet in the am, 2 at lunch, 2 dinner and 2 at bedtime      . HYDROcodone-acetaminophen (NORCO) 10-325 MG per tablet Take 1 tablet by mouth every 6 (six) hours as needed. For pain      . LORazepam (ATIVAN) 0.5 MG tablet Take 0.5 mg by mouth every 6 (six) hours as needed. For  anxiety      . losartan (COZAAR) 100 MG tablet Take 100 mg by mouth daily.      . metFORMIN (GLUCOPHAGE) 500 MG tablet Take 500 mg by mouth daily.       . mirtazapine (REMERON) 30 MG tablet Take 50 mg by mouth at bedtime as needed. For insomnia      . potassium chloride SA (K-DUR,KLOR-CON) 20 MEQ tablet Take 20 mEq by mouth daily.       [name] PROAIR HFA 108 (90 BASE) MCG/ACT inhaler Inhale 2 puffs into the lungs every 6 (six) hours as needed. For shortness of breath/wheezing      . sertraline (ZOLOFT) 100 MG tablet Take 100 mg by mouth daily.      . simvastatin (ZOCOR) 10 MG tablet Take 1 tablet (10 mg total) by mouth at bedtime.  90 tablet  1  . temazepam (RESTORIL) 7.5 MG capsule Take 7.5 mg by mouth at bedtime as needed. Insomnia       No current facility-administered medications for this  visit.    Allergies  Allergen Reactions  . Calan (Verapamil Hcl) Other (See Comments)    Patient states it makes her "out of her mind"; bp bottoms out  . Crestor (Rosuvastatin Calcium) Other (See Comments)    Muscle pain  . Escitalopram Oxalate Other (See Comments)    Unknown   . Lipitor (Atorvastatin Calcium) Other (See Comments)    myalgia  . Lopressor (Metoprolol Tartrate) Other (See Comments)    Blood pressure bottoms out   . Norvasc (Amlodipine Besylate) Other (See Comments)    fatigue  . Nsaids   . Sulfa Antibiotics Nausea Only and Other (See Comments)    Makes her stomach hurt  . Vimovo (Naproxen-Esomeprazole)   . Penicillins Hives and Rash    Patient Active Problem List  Diagnosis  . HYPERLIPIDEMIA  . DEPRESSIVE DISORDER NOT ELSEWHERE CLASSIFIED  . HYPERTENSION  . ALLERGIC RHINITIS  . CHRONIC OBSTRUCTIVE PULMONARY DISEASE, MODERATE  . SHORTNESS OF BREATH  . Cough  . Atrial fibrillation  . Pacemaker  . Hypertrophic cardiomyopathy  . Osteoarthritis  . Situational mixed anxiety and depressive disorder  . Hypercholesterolemia  . Mitral regurgitation  . Fall  . Diabetes mellitus  . Chest pain at rest  . TIA (transient ischemic attack)  . Memory change  . Memory deficit    History  Smoking status  . Former Smoker -- 1.00 packs/day for 40 years  . Types: Cigarettes  . Quit date: 11/15/2005  Smokeless tobacco  . Never Used    History  Alcohol Use No    Family History  Problem Relation Age of Onset  . Other Father     died in plane crash  . Cancer Mother     lymphoma-NHL  . Coronary artery disease Grandchild     Review of Systems: Constitutional: no fever chills diaphoresis or fatigue or change in weight.  Head and neck: no hearing loss, no epistaxis, no photophobia or visual disturbance. Respiratory: No cough, shortness of breath or wheezing. Cardiovascular: No chest pain peripheral edema, palpitations. Gastrointestinal: No abdominal  distention, no abdominal pain, no change in bowel habits hematochezia or melena. Genitourinary: No dysuria, no frequency, no urgency, no nocturia. Musculoskeletal:No arthralgias, no back pain, no gait disturbance or myalgias. Neurological: No dizziness, no headaches, no numbness, no seizures, no syncope, no weakness, no tremors. Hematologic: No lymphadenopathy, no easy bruising. Psychiatric: No confusion, no hallucinations, no sleep disturbance.    Physical Exam:  Filed Vitals:   02/18/13 1507  BP: 124/62  Pulse: 62   the general appearance reveals a well-developed well-nourished woman in no distress.  She is on nasal oxygen 24/7.The head and neck exam reveals pupils equal and reactive.  Extraocular movements are full.  There is no scleral icterus.  The mouth and pharynx are normal.  The neck is supple.  The carotids reveal no bruits.  The jugular venous pressure is normal.  The  thyroid is not enlarged.  There is no lymphadenopathy.  The chest is clear to percussion and auscultation.  There are mild expiratory wheezes bilaterally.  Expansion of the chest is symmetrical.  The precordium is quiet.  The first heart sound is normal.  The second heart sound is physiologically split.  There is no gallop rub or click.  There is a soft systolic murmur at the left sternal edge. There is no abnormal lift or heave.  The abdomen is soft and nontender.  The bowel sounds are normal.  The liver and spleen are not enlarged.  There are no abdominal masses.  There are no abdominal bruits.  Extremities reveal good pedal pulses.  There is no phlebitis or edema.  There is no cyanosis or clubbing.  Strength is normal and symmetrical in all extremities.  There is no lateralizing weakness.  There are no sensory deficits.  The skin is warm and dry.  There is no rash.     Assessment / Plan: Continue on same medication.  We will update her two-dimensional echocardiogram to look specifically at her degree of pulmonary  hypertension. Recheck in 4 months for followup office visit and EKG

## 2013-02-18 NOTE — Patient Instructions (Addendum)
Your physician has requested that you have an echocardiogram. Echocardiography is a painless test that uses sound waves to create images of your heart. It provides your doctor with information about the size and shape of your heart and how well your heart's chambers and valves are working. This procedure takes approximately one hour. There are no restrictions for this procedure.  Your physician recommends that you continue on your current medications as directed. Please refer to the Current Medication list given to you today.  Your physician wants you to follow-up in: 4 month ov/ekg You will receive a reminder letter in the mail two months in advance. If you don't receive a letter, please call our office to schedule the follow-up appointment.   ADD ASPIRIN 81 MG DAILY

## 2013-02-18 NOTE — Assessment & Plan Note (Signed)
The patient has not been having any hypoglycemic episodes 

## 2013-02-18 NOTE — Assessment & Plan Note (Signed)
The patient has not had any TIA symptoms.  She is no longer on Coumadin.  We clarified with her today that she is to be taking a 81 mg aspirin daily.

## 2013-02-18 NOTE — Assessment & Plan Note (Signed)
The patient has noted exertional dyspnea with walking.  She feels that her heart pounds when she walks.  She also gets some chest discomfort.  Her symptoms are consistent with her known history of pulmonary fibrosis and pulmonary hypertension and IHSS.  She is to continue present cardiac meds.  Because of her dyspnea she qualifies for a handicap parking sticker and we filled out the form for her today

## 2013-02-19 ENCOUNTER — Other Ambulatory Visit: Payer: Self-pay | Admitting: *Deleted

## 2013-02-19 MED ORDER — BISOPROLOL FUMARATE 10 MG PO TABS: 10.0000 mg | ORAL_TABLET | Freq: Every day | ORAL | Status: DC

## 2013-02-24 LAB — REMOTE PACEMAKER DEVICE
BATTERY VOLTAGE: 2.79 v
BMOD-0003RV: 30
BMOD-0005RV: 95 {beats}/min
BRDY-0002RV: 60 {beats}/min
RV LEAD IMPEDENCE PM: 411 Ohm
RV LEAD THRESHOLD: 1.625 v
VENTRICULAR PACING PM: 47

## 2013-03-01 ENCOUNTER — Ambulatory Visit (HOSPITAL_COMMUNITY): Payer: Medicare Other | Attending: Cardiology | Admitting: Radiology

## 2013-03-01 DIAGNOSIS — E119 Type 2 diabetes mellitus without complications: Secondary | ICD-10-CM | POA: Insufficient documentation

## 2013-03-01 DIAGNOSIS — I4891 Unspecified atrial fibrillation: Secondary | ICD-10-CM | POA: Insufficient documentation

## 2013-03-01 DIAGNOSIS — I079 Rheumatic tricuspid valve disease, unspecified: Secondary | ICD-10-CM | POA: Insufficient documentation

## 2013-03-01 DIAGNOSIS — I319 Disease of pericardium, unspecified: Secondary | ICD-10-CM | POA: Insufficient documentation

## 2013-03-01 DIAGNOSIS — R0989 Other specified symptoms and signs involving the circulatory and respiratory systems: Secondary | ICD-10-CM | POA: Insufficient documentation

## 2013-03-01 DIAGNOSIS — Z8673 Personal history of transient ischemic attack (TIA), and cerebral infarction without residual deficits: Secondary | ICD-10-CM | POA: Insufficient documentation

## 2013-03-01 DIAGNOSIS — Z9981 Dependence on supplemental oxygen: Secondary | ICD-10-CM | POA: Insufficient documentation

## 2013-03-01 DIAGNOSIS — I379 Nonrheumatic pulmonary valve disorder, unspecified: Secondary | ICD-10-CM | POA: Insufficient documentation

## 2013-03-01 DIAGNOSIS — J449 Chronic obstructive pulmonary disease, unspecified: Secondary | ICD-10-CM | POA: Insufficient documentation

## 2013-03-01 DIAGNOSIS — I059 Rheumatic mitral valve disease, unspecified: Secondary | ICD-10-CM | POA: Insufficient documentation

## 2013-03-01 DIAGNOSIS — I272 Pulmonary hypertension, unspecified: Secondary | ICD-10-CM

## 2013-03-01 DIAGNOSIS — J4489 Other specified chronic obstructive pulmonary disease: Secondary | ICD-10-CM | POA: Insufficient documentation

## 2013-03-01 DIAGNOSIS — R0609 Other forms of dyspnea: Secondary | ICD-10-CM | POA: Insufficient documentation

## 2013-03-01 NOTE — Progress Notes (Signed)
Echocardiogram performed.  

## 2013-03-04 ENCOUNTER — Telehealth: Payer: Self-pay | Admitting: *Deleted

## 2013-03-04 NOTE — Telephone Encounter (Signed)
Message copied by Burnell Blanks on Thu Mar 04, 2013  6:07 PM ------      Message from: Cassell Clement      Created: Mon Mar 01, 2013  6:45 PM       Please report.  The echocardiogram shows good left ventricular function.  Unfortunately the pulmonary artery pressure remains very high at 82 mmHg.  This is why she is so short of breath with exertion.  Continue same medication.  Continue close followup with pulmonary doctors. ------

## 2013-03-04 NOTE — Telephone Encounter (Signed)
Advised patient and will forward to pulmonologist

## 2013-03-11 ENCOUNTER — Encounter: Payer: Self-pay | Admitting: *Deleted

## 2013-03-22 ENCOUNTER — Telehealth: Payer: Self-pay | Admitting: Cardiology

## 2013-03-22 NOTE — Telephone Encounter (Signed)
Advised patient, will continue daily and increase her potassium intake with foods

## 2013-03-22 NOTE — Telephone Encounter (Signed)
New problem    C/O feeling weak. Think she might be in heart failure.

## 2013-03-22 NOTE — Telephone Encounter (Signed)
Continue taking the Lasix does once a day 40 mg.

## 2013-03-22 NOTE — Telephone Encounter (Signed)
Legs are weak states she doesn't feel good. Did not take fluid pill yesterday since she went to church. Took one today and feels worn out and no energy. Did have increased urinary output after taking Lasix 40 mg today. Does not take Lasix but once a day, can not tolerate twice a day. Will forward to  Dr. Patty Sermons for review

## 2013-04-01 ENCOUNTER — Other Ambulatory Visit: Payer: Self-pay | Admitting: Family Medicine

## 2013-04-01 ENCOUNTER — Ambulatory Visit
Admission: RE | Admit: 2013-04-01 | Discharge: 2013-04-01 | Disposition: A | Discharge status: Home / Self Care | Payer: Medicare Other | Source: Ambulatory Visit | Attending: Family Medicine | Admitting: Family Medicine

## 2013-04-01 DIAGNOSIS — S0990XA Unspecified injury of head, initial encounter: Secondary | ICD-10-CM

## 2013-04-12 ENCOUNTER — Telehealth: Payer: Self-pay | Admitting: Cardiology

## 2013-04-12 NOTE — Telephone Encounter (Signed)
New Prob     Requesting to speak to nurse regarding medication FUROSIMIDE & DILTIAZEM.

## 2013-04-12 NOTE — Telephone Encounter (Signed)
Left message to call back  

## 2013-04-13 ENCOUNTER — Ambulatory Visit: Payer: Medicare Other | Admitting: Cardiology

## 2013-04-13 NOTE — Telephone Encounter (Signed)
Follow Up  Pt returned your call from yesterday. She also cancelled her appt for today. She stated she did not need the appt and did not want to come in. She just wants to speak with you regarding a medication her PCP gave her.

## 2013-04-13 NOTE — Telephone Encounter (Signed)
Follow Up ° ° ° ° ° °Pt calling in returning phone call from earlier. Please call back. °

## 2013-04-13 NOTE — Telephone Encounter (Signed)
Advised patient and scheduled ov for next week  

## 2013-04-13 NOTE — Telephone Encounter (Signed)
Okay.  We will see how she does on the reduced medication.

## 2013-04-13 NOTE — Telephone Encounter (Signed)
PCP decreased Lasix to 20 mg daily and diltiazem once a day secondary to feeling weak, wanted  Dr. Patty Sermons to be aware. Will forward to  Dr. Patty Sermons for review (had appointment today and cancelled)

## 2013-04-14 ENCOUNTER — Other Ambulatory Visit: Payer: Self-pay | Admitting: *Deleted

## 2013-04-14 MED ORDER — LOSARTAN POTASSIUM 100 MG PO TABS: 100.0000 mg | ORAL_TABLET | Freq: Every day | ORAL | Status: DC

## 2013-04-22 ENCOUNTER — Ambulatory Visit (INDEPENDENT_AMBULATORY_CARE_PROVIDER_SITE_OTHER): Payer: Medicare Other | Admitting: Cardiology

## 2013-04-22 ENCOUNTER — Encounter: Payer: Self-pay | Admitting: Cardiology

## 2013-04-22 VITALS — BP 150/84 | HR 86 | Ht 64.0 in | Wt 149.0 lb

## 2013-04-22 DIAGNOSIS — I4891 Unspecified atrial fibrillation: Secondary | ICD-10-CM

## 2013-04-22 DIAGNOSIS — I422 Other hypertrophic cardiomyopathy: Secondary | ICD-10-CM

## 2013-04-22 DIAGNOSIS — R0989 Other specified symptoms and signs involving the circulatory and respiratory systems: Secondary | ICD-10-CM

## 2013-04-22 DIAGNOSIS — G459 Transient cerebral ischemic attack, unspecified: Secondary | ICD-10-CM

## 2013-04-22 DIAGNOSIS — E7800 Pure hypercholesterolemia, unspecified: Secondary | ICD-10-CM

## 2013-04-22 NOTE — Assessment & Plan Note (Signed)
She has hypertrophic cardiomyopathy.  Fluid balance management has been difficult.  Recently her PCP try to decrease the furosemide that after a few days the patient became bloated and fluid overloaded and on her own went back to taking a full 40 mg daily

## 2013-04-22 NOTE — Patient Instructions (Addendum)
Your physician recommends that you continue on your current medications as directed. Please refer to the Current Medication list given to you today.  Your physician wants you to follow-up in: 4 month ov/ekg  You will receive a reminder letter in the mail two months in advance. If you don't receive a letter, please call our office to schedule the follow-up appointment.  

## 2013-04-22 NOTE — Assessment & Plan Note (Signed)
She has not had any further TIAs episodes

## 2013-04-22 NOTE — Assessment & Plan Note (Signed)
She is on simvastatin for her hypercholesterolemia.  She's not having no myalgias.

## 2013-04-22 NOTE — Progress Notes (Signed)
[name] [name] Date of Birth:  01/17/1936 [facility] [address] [place], [place]  [address] [phone]         Fax   [phone]  History of Present Illness: This pleasant 77 year old woman is seen for a scheduled followup office visit. She was recently seen in the emergency room because of worsening dyspnea which responded to increased dose of Lasix and she did not have to be admitted. She has a history of chronic atrial fibrillation. She has a pacemaker in place.  Her most recent echocardiogram was 03/01/13.  The results were as follows:  - Left ventricle: The cavity size was normal. Wall thickness was increased in a pattern of mild LVH. Systolic function was normal. The estimated ejection fraction was in the range of 60% to 65%. Wall motion was normal; there were no regional wall motion abnormalities. The study is not technically sufficient to allow evaluation of LV diastolic function. - Ventricular septum: The contour showed diastolic flattening and systolic flattening. - Mitral valve: Calcified annulus. Mild regurgitation. - Left atrium: The atrium was moderately dilated. - Right atrium: The atrium was moderately dilated. - Tricuspid valve: Moderate regurgitation. - Pulmonary arteries: Systolic pressure was severely increased. PA peak pressure: 82mm Hg (S). - Pericardium, extracardiac: A trivial pericardial effusion was identified.   She has a history of pulmonary hypertension and her chest x-ray shows pulmonary fibrosis .She has a history of poor balance weakness and frequent falls. She previously was on Coumadin which had to be stopped because of her fall risk. She now takes a daily 81 mg aspirin. The patient has also had problems with urinary frequency. She has difficulty taking the amount of Lasix that she needs to take for her dyspnea and pulmonary hypertension.  She continues to have problems with easy fatigue.  She has symptoms of depression and is on  antidepressants.  She's had problems with poor balance related to her diabetes mellitus. She has chronic back pain and is followed by Dr. Vear Clock at the pain center    Current Outpatient Prescriptions  Medication Sig Dispense Refill  . Ascorbic Acid (VITAMIN C) 500 MG tablet Take 500 mg by mouth daily.       [name] aspirin 81 MG tablet Take 81 mg by mouth daily.      . bisoprolol (ZEBETA) 10 MG tablet Take 1 tablet (10 mg total) by mouth daily.  90 tablet  3  . budesonide-formoterol (SYMBICORT) 80-4.5 MCG/ACT inhaler Inhale 2 puffs into the lungs 2 (two) times daily.  1 Inhaler  2  . BUPROPION HCL PO Take by mouth daily.      . calcium carbonate (OS-CAL) 600 MG TABS Take 600 mg by mouth daily.        . Cholecalciferol (VITAMIN D) 2000 UNITS CAPS Take 1 capsule by mouth daily.        [name] conjugated estrogens (PREMARIN) vaginal cream Place 0.625 g vaginally daily as needed. For vaginal dryness      . diltiazem (CARDIZEM CD) 120 MG 24 hr capsule Take 120 mg by mouth 2 (two) times daily.       . famotidine (PEPCID) 20 MG tablet Take 20 mg by mouth 2 (two) times daily as needed. For GERD      . fluticasone (FLONASE) 50 MCG/ACT nasal spray Place 2 sprays into the nose daily.  16 g  3  . furosemide (LASIX) 40 MG tablet Take 40 mg by mouth daily.       [name]  gabapentin (NEURONTIN) 300 MG capsule Take 300-600 mg by mouth 4 (four) times daily. Takes 1 tablet in the am, 2 at lunch, 2 dinner and 2 at bedtime      . HYDROcodone-acetaminophen (NORCO) 10-325 MG per tablet Take 1 tablet by mouth every 6 (six) hours as needed. For pain      . LORazepam (ATIVAN) 0.5 MG tablet Take 0.5 mg by mouth every 6 (six) hours as needed. For anxiety      . losartan (COZAAR) 100 MG tablet Take 1 tablet (100 mg total) by mouth daily.  30 tablet  3  . metFORMIN (GLUCOPHAGE) 500 MG tablet Take 500 mg by mouth 2 (two) times daily.       . mirtazapine (REMERON) 30 MG tablet Take 50 mg by mouth at bedtime as needed. For insomnia      .  potassium chloride SA (K-DUR,KLOR-CON) 20 MEQ tablet Take 20 mEq by mouth daily.       [name] PROAIR HFA 108 (90 BASE) MCG/ACT inhaler Inhale 2 puffs into the lungs every 6 (six) hours as needed. For shortness of breath/wheezing      . sertraline (ZOLOFT) 100 MG tablet Take 100 mg by mouth daily.      . simvastatin (ZOCOR) 10 MG tablet Take 1 tablet (10 mg total) by mouth at bedtime.  90 tablet  1  . temazepam (RESTORIL) 7.5 MG capsule Take 7.5 mg by mouth at bedtime as needed. Insomnia       No current facility-administered medications for this visit.    Allergies  Allergen Reactions  . Calan (Verapamil Hcl) Other (See Comments)    Patient states it makes her "out of her mind"; bp bottoms out  . Crestor (Rosuvastatin Calcium) Other (See Comments)    Muscle pain  . Escitalopram Oxalate Other (See Comments)    Unknown   . Lipitor (Atorvastatin Calcium) Other (See Comments)    myalgia  . Lopressor (Metoprolol Tartrate) Other (See Comments)    Blood pressure bottoms out   . Norvasc (Amlodipine Besylate) Other (See Comments)    fatigue  . Nsaids   . Sulfa Antibiotics Nausea Only and Other (See Comments)    Makes her stomach hurt  . Vimovo (Naproxen-Esomeprazole)   . Penicillins Hives and Rash    Patient Active Problem List   Diagnosis Date Noted  . Memory deficit 02/07/2013  . Memory change 12/11/2012  . TIA (transient ischemic attack) 02/19/2012  . Chest pain at rest 08/05/2011  . Type II or unspecified type diabetes mellitus without mention of complication, uncontrolled 06/12/2011  . Fall 03/22/2011  . Mitral regurgitation 03/13/2011  . Hypertrophic cardiomyopathy   . Osteoarthritis   . Situational mixed anxiety and depressive disorder   . Hypercholesterolemia   . Pacemaker 02/26/2011  . Atrial fibrillation 01/28/2011  . CHRONIC OBSTRUCTIVE PULMONARY DISEASE, MODERATE 05/23/2010  . DEPRESSIVE DISORDER NOT ELSEWHERE CLASSIFIED 04/11/2010  . HYPERLIPIDEMIA 05/31/2009  .  HYPERTENSION 05/31/2009  . ALLERGIC RHINITIS 05/31/2009  . SHORTNESS OF BREATH 05/31/2009  . Cough 05/31/2009    History  Smoking status  . Former Smoker -- 1.00 packs/day for 40 years  . Types: Cigarettes  . Quit date: 11/15/2005  Smokeless tobacco  . Never Used    History  Alcohol Use No    Family History  Problem Relation Age of Onset  . Other Father     died in plane crash  . Cancer Mother     lymphoma-NHL  . Coronary artery  disease Grandchild     Review of Systems: Constitutional: no fever chills diaphoresis or fatigue or change in weight.  Head and neck: no hearing loss, no epistaxis, no photophobia or visual disturbance. Respiratory: No cough, shortness of breath or wheezing. Cardiovascular: No chest pain peripheral edema, palpitations. Gastrointestinal: No abdominal distention, no abdominal pain, no change in bowel habits hematochezia or melena. Genitourinary: No dysuria, no frequency, no urgency, no nocturia. Musculoskeletal:No arthralgias, no back pain, no gait disturbance or myalgias. Neurological: No dizziness, no headaches, no numbness, no seizures, no syncope, no weakness, no tremors. Hematologic: No lymphadenopathy, no easy bruising. Psychiatric: No confusion, no hallucinations, no sleep disturbance.    Physical Exam: Filed Vitals:   04/22/13 1157  BP: 150/84  Pulse: 86   the general appearance reveals a well-developed well-nourished elderly woman in no distress.  She has oxygen by nasal prongs at 3 L a minute.The head and neck exam reveals pupils equal and reactive.  Extraocular movements are full.  There is no scleral icterus.  The mouth and pharynx are normal.  The neck is supple.  The carotids reveal no bruits.  The jugular venous pressure is normal.  The  thyroid is not enlarged.  There is no lymphadenopathy.  The chest is clear to percussion and auscultation.  There are no rales or rhonchi.  Expansion of the chest is symmetrical.  The precordium is  quiet.  The pulse is irregular in atrial fibrillation  The first heart sound is normal.  The second heart sound is physiologically split.  There is no  gallop rub or click.  There is a grade 2/6 systolic ejection murmur at the base There is no abnormal lift or heave.  The abdomen is soft and nontender.  The bowel sounds are normal.  The liver and spleen are not enlarged.  There are no abdominal masses.  There are no abdominal bruits.  Extremities reveal good pedal pulses.  There is no significant edema. There is no cyanosis or clubbing.  Strength is normal and symmetrical in all extremities.  There is no lateralizing weakness.  There are no sensory deficits.  The skin is warm and dry.  There is no rash.    Assessment / Plan: The patient is to continue same medications.  She has been taking diltiazem twice a day.  She will continue taking a full 40 mg Lasix daily. Recheck in 4 months for office visit and EKG.

## 2013-04-23 ENCOUNTER — Ambulatory Visit (INDEPENDENT_AMBULATORY_CARE_PROVIDER_SITE_OTHER): Payer: Medicare Other | Admitting: Internal Medicine

## 2013-04-23 ENCOUNTER — Encounter: Payer: Self-pay | Admitting: Internal Medicine

## 2013-04-23 VITALS — BP 110/54 | HR 83 | Ht 64.0 in | Wt 146.0 lb

## 2013-04-23 DIAGNOSIS — R5383 Other fatigue: Secondary | ICD-10-CM | POA: Insufficient documentation

## 2013-04-23 DIAGNOSIS — R5381 Other malaise: Secondary | ICD-10-CM

## 2013-04-23 DIAGNOSIS — I4891 Unspecified atrial fibrillation: Secondary | ICD-10-CM

## 2013-04-23 DIAGNOSIS — Z950 Presence of cardiac pacemaker: Secondary | ICD-10-CM

## 2013-04-23 LAB — PACEMAKER DEVICE OBSERVATION
BMOD-0005RV: 95 {beats}/min
BRDY-0002RV: 60 {beats}/min
RV LEAD THRESHOLD: 1.5 V

## 2013-04-23 NOTE — Assessment & Plan Note (Addendum)
May be medications   Will try and decrease nuerontin ad bisoprolol to 5 mg   Trial to reduce diltiazem had an adverse impact

## 2013-04-23 NOTE — Progress Notes (Signed)
Patient Care Team: Benita Stabile, MD as PCP - General   HPI  [name] [name] is a 77 y.o. female S Seen to establish pacemaker followup. She initially underwent device implantation in 1998. She underwent generator replacement in 2006 receiving a [employer]tr[employer] and a device. Using the previously implanted leads.  She has permanent atrial fibrillation.   She has complaints of dyspnea. She is oxygen-dependent COPD. She apparently had recent interval improvement with an extra diuretic.  Her major complaint is fatigue which has been overwhelming and much much worse in the last 2 or 3 months. She seen her PCP was undertaken blood work without any explanation. She see her primary cardiologist who also has had no further insight.    Past Medical History  Diagnosis Date  . Hypertrophic cardiomyopathy   . Hypertension   . Osteoarthritis   . Situational mixed anxiety and depressive disorder   . Hypercholesterolemia   . Atrial fibrillation     Chronic; No longer on coumadin  . Pacemaker   . Falls frequently     coumadin stopped  . Pulmonary HTN     has had prior right heart cath in 2010; was felt that most likely due to elevated left sided pressures and would not benefit from vasodilator therapy  . Diabetes mellitus   . Pacemaker   . Oxygen dependent   . COPD (chronic obstructive pulmonary disease)   . TIA (transient ischemic attack)     Past Surgical History  Procedure Laterality Date  . Knee arthroplasty  2011    right knee  . Tonsillectomy    . Appendectomy    . Cardiac catheterization    . Insert / replace / remove pacemaker    . Other surgical history      hysterectomy-bening    Current Outpatient Prescriptions  Medication Sig Dispense Refill  . Ascorbic Acid (VITAMIN C) 500 MG tablet Take 500 mg by mouth daily.       [name] aspirin 81 MG tablet Take 81 mg by mouth daily.      . bisoprolol (ZEBETA) 10 MG tablet Take 1 tablet (10 mg total) by mouth daily.  90 tablet  3  .  budesonide-formoterol (SYMBICORT) 80-4.5 MCG/ACT inhaler Inhale 2 puffs into the lungs 2 (two) times daily.  1 Inhaler  2  . BUPROPION HCL PO Take by mouth daily.      . calcium carbonate (OS-CAL) 600 MG TABS Take 600 mg by mouth daily.        . Cholecalciferol (VITAMIN D) 2000 UNITS CAPS Take 1 capsule by mouth daily.        [name] conjugated estrogens (PREMARIN) vaginal cream Place 0.625 g vaginally daily as needed. For vaginal dryness      . diltiazem (CARDIZEM CD) 120 MG 24 hr capsule Take 120 mg by mouth 2 (two) times daily.       . famotidine (PEPCID) 20 MG tablet Take 20 mg by mouth 2 (two) times daily as needed. For GERD      . fluticasone (FLONASE) 50 MCG/ACT nasal spray Place 2 sprays into the nose daily.  16 g  3  . furosemide (LASIX) 40 MG tablet Take 40 mg by mouth daily.       [name] gabapentin (NEURONTIN) 300 MG capsule Take 300-600 mg by mouth 4 (four) times daily. Takes 1 tablet in the am, 2 at lunch, 2 dinner and 2 at bedtime      . HYDROcodone-acetaminophen (NORCO) 10-325 MG per  tablet Take 1 tablet by mouth every 6 (six) hours as needed. For pain      . LORazepam (ATIVAN) 0.5 MG tablet Take 0.5 mg by mouth every 6 (six) hours as needed. For anxiety      . losartan (COZAAR) 100 MG tablet Take 1 tablet (100 mg total) by mouth daily.  30 tablet  3  . metFORMIN (GLUCOPHAGE) 500 MG tablet Take 500 mg by mouth 2 (two) times daily.       . mirtazapine (REMERON) 30 MG tablet Take 50 mg by mouth at bedtime as needed. For insomnia      . potassium chloride SA (K-DUR,KLOR-CON) 20 MEQ tablet Take 20 mEq by mouth daily.       [name] PROAIR HFA 108 (90 BASE) MCG/ACT inhaler Inhale 2 puffs into the lungs every 6 (six) hours as needed. For shortness of breath/wheezing      . sertraline (ZOLOFT) 100 MG tablet Take 100 mg by mouth daily.      . simvastatin (ZOCOR) 10 MG tablet Take 1 tablet (10 mg total) by mouth at bedtime.  90 tablet  1  . temazepam (RESTORIL) 7.5 MG capsule Take 7.5 mg by mouth at bedtime  as needed. Insomnia       No current facility-administered medications for this visit.    Allergies  Allergen Reactions  . Calan (Verapamil Hcl) Other (See Comments)    Patient states it makes her "out of her mind"; bp bottoms out  . Crestor (Rosuvastatin Calcium) Other (See Comments)    Muscle pain  . Escitalopram Oxalate Other (See Comments)    Unknown   . Lipitor (Atorvastatin Calcium) Other (See Comments)    myalgia  . Lopressor (Metoprolol Tartrate) Other (See Comments)    Blood pressure bottoms out   . Norvasc (Amlodipine Besylate) Other (See Comments)    fatigue  . Nsaids   . Sulfa Antibiotics Nausea Only and Other (See Comments)    Makes her stomach hurt  . Vimovo (Naproxen-Esomeprazole)   . Penicillins Hives and Rash    Review of Systems negative except from HPI and PMH  Physical Exam BP 110/54  Pulse 83  Ht 5\' 4"  (1.626 m)  Wt 146 lb (66.225 kg)  BMI 25.05 kg/m2  SpO2 97% Well developed and nourished wearing oxygen HENT normal Neck supple with JVP-flat Clear Regular rate and rhythm, no murmurs or gallops Abd-soft with active BS No Clubbing cyanosis edema Skin-warm and dry A & Oriented  Grossly normal sensory and motor function  ECG demonstrates atrial fibrillation at 83% intervals-I/09/40 LVH with repolarization   Assessment and  Plan

## 2013-04-23 NOTE — Patient Instructions (Addendum)
Your physician wants you to follow-up in: YEAR  WITH DR Graciela Husbands  AND  September  WITH Melburn Hake You will receive a reminder letter in the mail two months in advance. If you don't receive a letter, please call our office to schedule the follow-up appointment. Your physician has recommended you make the following change in your medication:  DECREASE BISOPROLOL TO 5 MG  EVERY DAY AND DECREASE GABAPENTIN TO  300 MG  4 TABS PER DAY

## 2013-04-23 NOTE — Assessment & Plan Note (Signed)
The patient's device was interrogated.  The information was reviewed. No changes were made in the programming.    

## 2013-04-23 NOTE — Assessment & Plan Note (Signed)
Permanence. Rate controlled.

## 2013-04-29 ENCOUNTER — Encounter: Payer: Self-pay | Admitting: Internal Medicine

## 2013-05-03 ENCOUNTER — Telehealth: Payer: Self-pay | Admitting: Internal Medicine

## 2013-05-03 DIAGNOSIS — R0602 Shortness of breath: Secondary | ICD-10-CM

## 2013-05-03 DIAGNOSIS — J4489 Other specified chronic obstructive pulmonary disease: Secondary | ICD-10-CM

## 2013-05-03 DIAGNOSIS — J449 Chronic obstructive pulmonary disease, unspecified: Secondary | ICD-10-CM

## 2013-05-03 NOTE — Telephone Encounter (Signed)
lmtcb x1 for mandy 

## 2013-05-03 NOTE — Telephone Encounter (Signed)
[id number] with Mandy at [facility] to clarify what is needed. Carron Curie, CMA

## 2013-05-03 NOTE — Telephone Encounter (Signed)
Mandy with [facility] returned triage's call.   Pt's portable concentrator is off being fixed.  What they are requesting is a new Modality Rx to tanks while POC (portable O2) is getting prepared.   Angelica Chessman can be reached at [phone].  Antionette Fairy

## 2013-05-04 ENCOUNTER — Ambulatory Visit (INDEPENDENT_AMBULATORY_CARE_PROVIDER_SITE_OTHER): Payer: Medicare Other | Admitting: Internal Medicine

## 2013-05-04 ENCOUNTER — Encounter: Payer: Self-pay | Admitting: Internal Medicine

## 2013-05-04 VITALS — BP 102/76 | HR 62 | Temp 98.3°F | Ht 64.0 in | Wt 147.2 lb

## 2013-05-04 DIAGNOSIS — J4489 Other specified chronic obstructive pulmonary disease: Secondary | ICD-10-CM

## 2013-05-04 DIAGNOSIS — J449 Chronic obstructive pulmonary disease, unspecified: Secondary | ICD-10-CM

## 2013-05-04 DIAGNOSIS — I2789 Other specified pulmonary heart diseases: Secondary | ICD-10-CM

## 2013-05-04 DIAGNOSIS — R5381 Other malaise: Secondary | ICD-10-CM

## 2013-05-04 DIAGNOSIS — R5383 Other fatigue: Secondary | ICD-10-CM

## 2013-05-04 DIAGNOSIS — I272 Pulmonary hypertension, unspecified: Secondary | ICD-10-CM

## 2013-05-04 NOTE — Patient Instructions (Addendum)
COPD stable SEvere fatigue is due to lung, heart problems Continue oxygen and Symbicort I am referring you AGAIN to pulmonary rehabilitation that should help both her shortness of breath and memory and Fatigue Return to see me in 6 months or sooner if needed  - CAT score at followup

## 2013-05-04 NOTE — Assessment & Plan Note (Signed)
I think is due to cardiac, and pulmonary issues with asspociated deconditioning  Plan Re-refer pulm rehab

## 2013-05-04 NOTE — Telephone Encounter (Signed)
Spoke with Angelica Chessman at [facility] and clarified order that is needed.  Order placed.

## 2013-05-04 NOTE — Assessment & Plan Note (Signed)
Appears clinically stable. Advised to conitnue o2 and current bd regimen
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
## 2013-05-07 ENCOUNTER — Encounter: Payer: Self-pay | Admitting: Neurology

## 2013-05-07 ENCOUNTER — Inpatient Hospital Stay (HOSPITAL_COMMUNITY)
Admission: EM | Admit: 2013-05-07 | Discharge: 2013-05-10 | DRG: 292 | Disposition: A | Discharge status: Home / Self Care | Payer: Medicare Other | Attending: Internal Medicine | Admitting: Internal Medicine

## 2013-05-07 ENCOUNTER — Emergency Department (HOSPITAL_COMMUNITY): Payer: Medicare Other

## 2013-05-07 ENCOUNTER — Encounter (HOSPITAL_COMMUNITY): Payer: Self-pay | Admitting: *Deleted

## 2013-05-07 DIAGNOSIS — Z950 Presence of cardiac pacemaker: Secondary | ICD-10-CM

## 2013-05-07 DIAGNOSIS — I119 Hypertensive heart disease without heart failure: Secondary | ICD-10-CM | POA: Insufficient documentation

## 2013-05-07 DIAGNOSIS — I10 Essential (primary) hypertension: Secondary | ICD-10-CM | POA: Diagnosis present

## 2013-05-07 DIAGNOSIS — Z87891 Personal history of nicotine dependence: Secondary | ICD-10-CM

## 2013-05-07 DIAGNOSIS — J441 Chronic obstructive pulmonary disease with (acute) exacerbation: Secondary | ICD-10-CM | POA: Diagnosis not present

## 2013-05-07 DIAGNOSIS — F3289 Other specified depressive episodes: Secondary | ICD-10-CM | POA: Diagnosis present

## 2013-05-07 DIAGNOSIS — Z8673 Personal history of transient ischemic attack (TIA), and cerebral infarction without residual deficits: Secondary | ICD-10-CM

## 2013-05-07 DIAGNOSIS — Z79899 Other long term (current) drug therapy: Secondary | ICD-10-CM

## 2013-05-07 DIAGNOSIS — R0902 Hypoxemia: Secondary | ICD-10-CM

## 2013-05-07 DIAGNOSIS — J449 Chronic obstructive pulmonary disease, unspecified: Secondary | ICD-10-CM | POA: Diagnosis present

## 2013-05-07 DIAGNOSIS — E785 Hyperlipidemia, unspecified: Secondary | ICD-10-CM | POA: Diagnosis present

## 2013-05-07 DIAGNOSIS — Z96659 Presence of unspecified artificial knee joint: Secondary | ICD-10-CM

## 2013-05-07 DIAGNOSIS — F411 Generalized anxiety disorder: Secondary | ICD-10-CM | POA: Diagnosis present

## 2013-05-07 DIAGNOSIS — J9610 Chronic respiratory failure, unspecified whether with hypoxia or hypercapnia: Secondary | ICD-10-CM | POA: Diagnosis present

## 2013-05-07 DIAGNOSIS — R0603 Acute respiratory distress: Secondary | ICD-10-CM

## 2013-05-07 DIAGNOSIS — R0602 Shortness of breath: Secondary | ICD-10-CM

## 2013-05-07 DIAGNOSIS — F4323 Adjustment disorder with mixed anxiety and depressed mood: Secondary | ICD-10-CM

## 2013-05-07 DIAGNOSIS — Z9981 Dependence on supplemental oxygen: Secondary | ICD-10-CM | POA: Insufficient documentation

## 2013-05-07 DIAGNOSIS — Z7982 Long term (current) use of aspirin: Secondary | ICD-10-CM

## 2013-05-07 DIAGNOSIS — I422 Other hypertrophic cardiomyopathy: Secondary | ICD-10-CM

## 2013-05-07 DIAGNOSIS — R7989 Other specified abnormal findings of blood chemistry: Secondary | ICD-10-CM

## 2013-05-07 DIAGNOSIS — F329 Major depressive disorder, single episode, unspecified: Secondary | ICD-10-CM

## 2013-05-07 DIAGNOSIS — I340 Nonrheumatic mitral (valve) insufficiency: Secondary | ICD-10-CM

## 2013-05-07 DIAGNOSIS — R05 Cough: Secondary | ICD-10-CM

## 2013-05-07 DIAGNOSIS — J309 Allergic rhinitis, unspecified: Secondary | ICD-10-CM

## 2013-05-07 DIAGNOSIS — I2789 Other specified pulmonary heart diseases: Secondary | ICD-10-CM | POA: Diagnosis present

## 2013-05-07 DIAGNOSIS — E7800 Pure hypercholesterolemia, unspecified: Secondary | ICD-10-CM

## 2013-05-07 DIAGNOSIS — I4891 Unspecified atrial fibrillation: Secondary | ICD-10-CM | POA: Diagnosis present

## 2013-05-07 DIAGNOSIS — R413 Other amnesia: Secondary | ICD-10-CM

## 2013-05-07 DIAGNOSIS — I272 Pulmonary hypertension, unspecified: Secondary | ICD-10-CM | POA: Diagnosis present

## 2013-05-07 DIAGNOSIS — IMO0001 Reserved for inherently not codable concepts without codable children: Secondary | ICD-10-CM | POA: Diagnosis present

## 2013-05-07 DIAGNOSIS — G459 Transient cerebral ischemic attack, unspecified: Secondary | ICD-10-CM

## 2013-05-07 DIAGNOSIS — M1990 Unspecified osteoarthritis, unspecified site: Secondary | ICD-10-CM

## 2013-05-07 DIAGNOSIS — E876 Hypokalemia: Secondary | ICD-10-CM | POA: Diagnosis not present

## 2013-05-07 DIAGNOSIS — I5033 Acute on chronic diastolic (congestive) heart failure: Principal | ICD-10-CM | POA: Diagnosis present

## 2013-05-07 DIAGNOSIS — J810 Acute pulmonary edema: Secondary | ICD-10-CM | POA: Diagnosis present

## 2013-05-07 DIAGNOSIS — J4489 Other specified chronic obstructive pulmonary disease: Secondary | ICD-10-CM | POA: Diagnosis present

## 2013-05-07 DIAGNOSIS — R5383 Other fatigue: Secondary | ICD-10-CM

## 2013-05-07 DIAGNOSIS — I509 Heart failure, unspecified: Secondary | ICD-10-CM | POA: Diagnosis present

## 2013-05-07 DIAGNOSIS — R079 Chest pain, unspecified: Secondary | ICD-10-CM

## 2013-05-07 DIAGNOSIS — I5031 Acute diastolic (congestive) heart failure: Secondary | ICD-10-CM

## 2013-05-07 LAB — COMPREHENSIVE METABOLIC PANEL
BUN: 18 mg/dL (ref 6–23)
CO2: 27 mEq/L (ref 19–32)
Calcium: 10.3 mg/dL (ref 8.4–10.5)
Chloride: 95 mEq/L — ABNORMAL LOW (ref 96–112)
Creatinine, Ser: 0.81 mg/dL (ref 0.50–1.10)
GFR calc Af Amer: 81 mL/min — ABNORMAL LOW (ref >90)
GFR calc non Af Amer: 70 mL/min — ABNORMAL LOW (ref >90)
Glucose, Bld: 138 mg/dL — ABNORMAL HIGH (ref 70–99)
Total Bilirubin: 0.7 mg/dL (ref 0.3–1.2)

## 2013-05-07 LAB — CBC WITH DIFFERENTIAL/PLATELET
Basophils Absolute: 0 10*3/uL (ref 0.0–0.1)
Basophils Relative: 0 % (ref 0–1)
Eosinophils Absolute: 0.3 K/uL (ref 0.0–0.7)
Eosinophils Relative: 3 % (ref 0–5)
HCT: 38.7 % (ref 36.0–46.0)
Hemoglobin: 13.6 g/dL (ref 12.0–15.0)
Lymphocytes Relative: 16 % (ref 12–46)
Lymphs Abs: 1.5 K/uL (ref 0.7–4.0)
MCH: 34.2 pg — ABNORMAL HIGH (ref 26.0–34.0)
MCHC: 35.1 g/dL (ref 30.0–36.0)
MCV: 97.2 fL (ref 78.0–100.0)
Monocytes Absolute: 0.5 10*3/uL (ref 0.1–1.0)
Monocytes Relative: 6 % (ref 3–12)
Neutro Abs: 6.8 10*3/uL (ref 1.7–7.7)
Neutrophils Relative %: 75 % (ref 43–77)
Platelets: 265 K/uL (ref 150–400)
RBC: 3.98 MIL/uL (ref 3.87–5.11)
RDW: 13.7 % (ref 11.5–15.5)
WBC: 9.1 10*3/uL (ref 4.0–10.5)

## 2013-05-07 LAB — COMPREHENSIVE METABOLIC PANEL WITH GFR
ALT: 11 U/L (ref 0–35)
AST: 39 U/L — ABNORMAL HIGH (ref 0–37)
Albumin: 4.2 g/dL (ref 3.5–5.2)
Alkaline Phosphatase: 75 U/L (ref 39–117)
Potassium: 3.9 meq/L (ref 3.5–5.1)
Sodium: 138 meq/L (ref 135–145)
Total Protein: 8.1 g/dL (ref 6.0–8.3)

## 2013-05-07 LAB — POCT I-STAT 3, ART BLOOD GAS (G3+)
Acid-Base Excess: 7 mmol/L — ABNORMAL HIGH (ref 0.0–2.0)
Bicarbonate: 28 meq/L — ABNORMAL HIGH (ref 20.0–24.0)
O2 Saturation: 100 %
TCO2: 29 mmol/L (ref 0–100)
pCO2 arterial: 29.5 mmHg — ABNORMAL LOW (ref 35.0–45.0)
pH, Arterial: 7.586 — ABNORMAL HIGH (ref 7.350–7.450)
pO2, Arterial: 198 mmHg — ABNORMAL HIGH (ref 80.0–100.0)

## 2013-05-07 LAB — PRO B NATRIURETIC PEPTIDE: Pro B Natriuretic peptide (BNP): 1129 pg/mL — ABNORMAL HIGH (ref 0–125)

## 2013-05-07 LAB — POCT I-STAT TROPONIN I: Troponin i, poc: 0.01 ng/mL (ref 0.00–0.08)

## 2013-05-07 MED ORDER — FUROSEMIDE 10 MG/ML IJ SOLN
40.0000 mg | Freq: Once | INTRAMUSCULAR | Status: AC
  Administered 2013-05-07: 40 mg via INTRAVENOUS
  Filled 2013-05-07: qty 4

## 2013-05-07 NOTE — ED Notes (Signed)
X-ray at bedside

## 2013-05-07 NOTE — ED Provider Notes (Signed)
I saw and evaluated the patient, reviewed the resident's note and I agree with the findings and plan.  ECG at time 21:38 shows underlying atrial fibrillation, intermittent ventricular paced beats with PVC's noted, normal axis, repol abn, similar to appearance of ECG from 10/07/12.    Pt with sudden onset of SOB, was feeling fatigued this AM, called EMS this evening, found to be somewhat cyanotic, respiratory distress, sats in the 70's.  On 100% O2, pt's sats improved, but she continued to be dyspneic and SOB.  They began CPAP and by arrival, pt seemed somewhat improved, but EMS reports quick decompensation when not on O2 for them.  Pt with h/o COPD and CHF, no sig wheezing on exam, tachypneic, labored breathing.  BiPAP.  No lower extremity swelling, edema, neg Homan's.  ABG shows no sig retention, not acidotic.  Pt put initially on BiPAP, pt now weaned off of BiPAP, on supplemental O2, BNP has come back elevated, troponin is normal.  Will treat as flash pulmonary edema, give lasix, NTG and admit for obs since pt continues to be symptomatic.  Impression: Respiratory distress CHF    Gavin Pound. Oletta Lamas, MD 05/07/13 2329

## 2013-05-07 NOTE — ED Notes (Signed)
Patient presents via EMS on the CPAP.  Stated she felt tired and sick this morning  Denies pain in legs and feet

## 2013-05-07 NOTE — ED Provider Notes (Signed)
History    CSN: [id number] Arrival date & time 05/07/13  2109  First MD Initiated Contact with Patient 05/07/13 2113     Chief Complaint  Patient presents with  . Respiratory Distress   (Consider location/radiation/quality/duration/timing/severity/associated sxs/prior Treatment) Patient is a 77 y.o. female presenting with shortness of breath. The history is provided by the patient and the EMS personnel.  Shortness of Breath Severity:  Severe Onset quality:  Gradual Duration:  1 day Timing:  Constant Progression:  Worsening Chronicity:  New Context comment:  Occurred at rest.  Relieved by:  Nothing Worsened by:  Nothing tried Ineffective treatments:  None tried Associated symptoms: chest pain (substernal pressure)   Associated symptoms: no abdominal pain, no cough, no diaphoresis, no fever and no vomiting    Past Medical History  Diagnosis Date  . Hypertrophic cardiomyopathy   . Hypertension   . Osteoarthritis   . Situational mixed anxiety and depressive disorder   . Hypercholesterolemia   . Atrial fibrillation     Chronic; No longer on coumadin  . Pacemaker   . Falls frequently     coumadin stopped  . Pulmonary HTN     has had prior right heart cath in 2010; was felt that most likely due to elevated left sided pressures and would not benefit from vasodilator therapy  . Diabetes mellitus   . Pacemaker   . Oxygen dependent   . COPD (chronic obstructive pulmonary disease)   . TIA (transient ischemic attack)    Past Surgical History  Procedure Laterality Date  . Knee arthroplasty  2011    right knee  . Tonsillectomy    . Appendectomy    . Cardiac catheterization    . Insert / replace / remove pacemaker    . Other surgical history      hysterectomy-bening   Family History  Problem Relation Age of Onset  . Other Father     died in plane crash  . Cancer Mother     lymphoma-NHL  . Coronary artery disease Grandchild    History  Substance Use Topics  .  Smoking status: Former Smoker -- 1.00 packs/day for 40 years    Types: Cigarettes    Quit date: 11/15/2005  . Smokeless tobacco: Never Used  . Alcohol Use: No   OB History   Grav Para Term Preterm Abortions TAB SAB Ect Mult Living                 Review of Systems  Constitutional: Negative for fever, chills and diaphoresis.  Respiratory: Positive for shortness of breath. Negative for cough and chest tightness.   Cardiovascular: Positive for chest pain (substernal pressure). Negative for palpitations.  Gastrointestinal: Negative for nausea, vomiting, abdominal pain and diarrhea.  Genitourinary: Negative for dysuria.  Neurological: Negative for dizziness and weakness.  Psychiatric/Behavioral: Negative for confusion.  All other systems reviewed and are negative.    Allergies  Calan; Crestor; Escitalopram oxalate; Lipitor; Lopressor; Norvasc; Nsaids; Sulfa antibiotics; Vimovo; and Penicillins  Home Medications   Current Outpatient Rx  Name  Route  Sig  Dispense  Refill  . aspirin 81 MG tablet   Oral   Take 81 mg by mouth daily.         . bisoprolol (ZEBETA) 10 MG tablet   Oral   Take 1 tablet (10 mg total) by mouth daily.   90 tablet   3   . budesonide-formoterol (SYMBICORT) 80-4.5 MCG/ACT inhaler   Inhalation   Inhale 2  puffs into the lungs 2 (two) times daily.   1 Inhaler   2   . BUPROPION HCL PO   Oral   Take by mouth daily.         . calcium carbonate (OS-CAL) 600 MG TABS   Oral   Take 600 mg by mouth daily.           . calcium carbonate (TUMS) 500 MG chewable tablet   Oral   Chew 1 tablet by mouth daily.         . Cholecalciferol (VITAMIN D PO)   Oral   Take 2,000 mg by mouth as directed.         . conjugated estrogens (PREMARIN) vaginal cream   Vaginal   Place 0.625 g vaginally daily as needed. For vaginal dryness         . diltiazem (CARDIZEM CD) 120 MG 24 hr capsule   Oral   Take 120 mg by mouth 2 (two) times daily.          [name]  diltiazem (CARDIZEM) 120 MG tablet   Oral   Take 120 mg by mouth 2 (two) times daily.         . famotidine (PEPCID) 20 MG tablet   Oral   Take 20 mg by mouth 2 (two) times daily as needed. For GERD         . fluticasone (FLONASE) 50 MCG/ACT nasal spray   Nasal   Place 2 sprays into the nose daily.   16 g   3   . furosemide (LASIX) 40 MG tablet   Oral   Take 40 mg by mouth daily.          [name] gabapentin (NEURONTIN) 300 MG capsule   Oral   Take 300-600 mg by mouth 4 (four) times daily. Takes 1 tablet in the am, 2 at lunch, 2 dinner and 2 at bedtime         . HYDROcodone-acetaminophen (NORCO) 10-325 MG per tablet   Oral   Take 1 tablet by mouth every 6 (six) hours as needed. For pain         . LORazepam (ATIVAN) 0.5 MG tablet   Oral   Take 0.5 mg by mouth every 6 (six) hours as needed. For anxiety         . losartan (COZAAR) 100 MG tablet   Oral   Take 1 tablet (100 mg total) by mouth daily.   30 tablet   3   . metFORMIN (GLUCOPHAGE) 500 MG tablet   Oral   Take 500 mg by mouth 2 (two) times daily.          . mirtazapine (REMERON) 30 MG tablet   Oral   Take 50 mg by mouth at bedtime as needed. For insomnia         . OXYGEN-HELIUM IN   Inhalation   Inhale into the lungs as directed.         . potassium chloride SA (K-DUR,KLOR-CON) 20 MEQ tablet   Oral   Take 20 mEq by mouth daily.          [name] PROAIR HFA 108 (90 BASE) MCG/ACT inhaler   Inhalation   Inhale 2 puffs into the lungs every 6 (six) hours as needed. For shortness of breath/wheezing         . sertraline (ZOLOFT) 100 MG tablet   Oral   Take 100 mg by mouth daily.         [name]  simvastatin (ZOCOR) 10 MG tablet   Oral   Take 1 tablet (10 mg total) by mouth at bedtime.   90 tablet   1   . temazepam (RESTORIL) 7.5 MG capsule   Oral   Take 7.5 mg by mouth at bedtime as needed. Insomnia          BP 166/94  Pulse 65  Resp 19  Ht 5\' 4"  (1.626 m)  Wt 148 lb (67.132 kg)  BMI 25.39  kg/m2  SpO2 97% Physical Exam  Nursing note and vitals reviewed. Constitutional: She is oriented to person, place, and time. She appears well-developed and well-nourished. She appears distressed.  HENT:  Head: Normocephalic and atraumatic.  Mouth/Throat: Oropharynx is clear and moist.  Eyes: EOM are normal. Pupils are equal, round, and reactive to light.  Neck: Normal range of motion. Neck supple.  Cardiovascular: Normal rate, regular rhythm and normal heart sounds.  Exam reveals no friction rub.   No murmur heard. Pulmonary/Chest: Breath sounds normal. She is in respiratory distress. She has no wheezes. She has no rales.  Tachypnea with accessory muscle use. Able to speak in short sentences.   Abdominal: Soft. There is no tenderness. There is no rebound and no guarding.  Musculoskeletal: Normal range of motion. She exhibits no edema and no tenderness.  Lymphadenopathy:    She has no cervical adenopathy.  Neurological: She is alert and oriented to person, place, and time.  Skin: Skin is warm and dry. No rash noted.  Psychiatric: She has a normal mood and affect. Her behavior is normal.    ED Course  Procedures (including critical care time) Labs Reviewed  CBC WITH DIFFERENTIAL - Abnormal; Notable for the following:    MCH 34.2 (*)    All other components within normal limits  COMPREHENSIVE METABOLIC PANEL - Abnormal; Notable for the following:    Chloride 95 (*)    Glucose, Bld 138 (*)    AST 39 (*)    GFR calc non Af Amer 70 (*)    GFR calc Af Amer 81 (*)    All other components within normal limits  PRO B NATRIURETIC PEPTIDE - Abnormal; Notable for the following:    Pro B Natriuretic peptide (BNP) 1129.0 (*)    All other components within normal limits  POCT I-STAT 3, BLOOD GAS (G3+) - Abnormal; Notable for the following:    pH, Arterial 7.586 (*)    pCO2 arterial 29.5 (*)    pO2, Arterial 198.0 (*)    Bicarbonate 28.0 (*)    Acid-Base Excess 7.0 (*)    All other  components within normal limits  POCT I-STAT TROPONIN I   Dg Chest Portable 1 View  05/07/2013   *RADIOLOGY REPORT*  Clinical Data: Respiratory distress  PORTABLE CHEST - 1 VIEW  Comparison: 11/22/2012; 09/28/2012  Findings: Grossly unchanged enlarged cardiac silhouette. Atherosclerotic calcifications within the thoracic aorta.  Stable positioning of support apparatus.  There is chronic findings of pulmonary venous congestion.  Persistent thickening along the right minor fissure.  Grossly unchanged bibasilar heterogeneous opacities, favored to represent atelectasis.  No new focal airspace opacities.  No pleural effusion or pneumothorax.  Unchanged bones.  IMPRESSION: Stable findings of cardiomegaly and chronic pulmonary venous congestion without definite acute cardiopulmonary disease.   Original Report Authenticated By: Tacey Ruiz, MD   Date: 05/07/2013  Rate: 69  Rhythm: atrial fibrillation and pace rhythm  QRS Axis: normal  Intervals: normal  ST/T Wave abnormalities: ST depressions laterally  Conduction Disutrbances:none  Narrative Interpretation:   Old EKG Reviewed: changes noted   1. Respiratory distress   2. Hypoxia   3. Elevated brain natriuretic peptide (BNP) level     MDM  18:59 PM 77 year old female with history of hypertrophic cardiomyopathy, atrial fibrillation, pacemaker placement, pulmonary hypertension, diabetes and COPD on 3L home O2 presenting with gradual onset dyspnea that started earlier today. Patient is on 3 L home O2, and upon EMS arrival was found to have sats of 70%. Sats improved to 100% nonrebreather but she remained dyspneic and was started on CPAP. On arrival she is dyspneic but able to speak in full senses. Lung sounds are clear. There is no peripheral edema. Slight ST depressions noted in lateral leads. We'll check labs, troponin, BNP, chest x-ray  11:45 PM pt has been transitioned from BiPAP to [place] at 3L. She is feeling better but still dyspneic. No chest pain  now. Will give lasix 40mg  IV. BNP is elevated, given sudden onset with CXR consider flash pulmonary edema. Although she is improved given continued dyspnea medicine consulted and pt admitted for further management.   Caren Hazy, MD 05/08/13 [phone]

## 2013-05-07 NOTE — ED Notes (Signed)
Gold tone necklace with silver tone heart, gold tone necklace with multicolored stones, alert necklace placed in silver tone purse.

## 2013-05-08 ENCOUNTER — Encounter (HOSPITAL_COMMUNITY): Payer: Self-pay

## 2013-05-08 DIAGNOSIS — I422 Other hypertrophic cardiomyopathy: Secondary | ICD-10-CM

## 2013-05-08 DIAGNOSIS — J984 Other disorders of lung: Secondary | ICD-10-CM

## 2013-05-08 DIAGNOSIS — I4891 Unspecified atrial fibrillation: Secondary | ICD-10-CM

## 2013-05-08 DIAGNOSIS — J810 Acute pulmonary edema: Secondary | ICD-10-CM | POA: Diagnosis present

## 2013-05-08 DIAGNOSIS — J449 Chronic obstructive pulmonary disease, unspecified: Secondary | ICD-10-CM

## 2013-05-08 DIAGNOSIS — I10 Essential (primary) hypertension: Secondary | ICD-10-CM

## 2013-05-08 DIAGNOSIS — R0603 Acute respiratory distress: Secondary | ICD-10-CM | POA: Diagnosis present

## 2013-05-08 LAB — MRSA PCR SCREENING: MRSA by PCR: NEGATIVE

## 2013-05-08 LAB — GLUCOSE, CAPILLARY
Glucose-Capillary: 156 mg/dL — ABNORMAL HIGH (ref 70–99)
Glucose-Capillary: 141 mg/dL — ABNORMAL HIGH (ref 70–99)
Glucose-Capillary: 105 mg/dL — ABNORMAL HIGH (ref 70–99)

## 2013-05-08 LAB — TROPONIN I
Troponin I: <0.3 ng/mL (ref <0.30)
Troponin I: <0.3 ng/mL (ref <0.30)
Troponin I: <0.3 ng/mL (ref <0.30)

## 2013-05-08 MED ORDER — ALBUTEROL SULFATE (5 MG/ML) 0.5% IN NEBU: 2.5000 mg | INHALATION_SOLUTION | RESPIRATORY_TRACT | Status: DC | PRN

## 2013-05-08 MED ORDER — HYDROCODONE-ACETAMINOPHEN 10-325 MG PO TABS
1.0000 | ORAL_TABLET | Freq: Four times a day (QID) | ORAL | Status: DC | PRN
  Administered 2013-05-08 – 2013-05-09 (×3): 1 via ORAL
  Filled 2013-05-08 (×3): qty 1

## 2013-05-08 MED ORDER — INSULIN ASPART 100 UNIT/ML ~~LOC~~ SOLN: 0.0000 [IU] | Freq: Every day | SUBCUTANEOUS | Status: DC

## 2013-05-08 MED ORDER — SODIUM CHLORIDE 0.9 % IJ SOLN
3.0000 mL | Freq: Two times a day (BID) | INTRAMUSCULAR | Status: DC
  Administered 2013-05-09 – 2013-05-10 (×4): 3 mL via INTRAVENOUS

## 2013-05-08 MED ORDER — SERTRALINE HCL 100 MG PO TABS
200.0000 mg | ORAL_TABLET | Freq: Every day | ORAL | Status: DC
  Administered 2013-05-08 – 2013-05-10 (×3): 200 mg via ORAL
  Filled 2013-05-08 (×3): qty 2

## 2013-05-08 MED ORDER — SODIUM CHLORIDE 0.9 % IJ SOLN: 3.0000 mL | INTRAMUSCULAR | Status: DC | PRN

## 2013-05-08 MED ORDER — FLUTICASONE PROPIONATE 50 MCG/ACT NA SUSP
2.0000 | Freq: Every day | NASAL | Status: DC
  Administered 2013-05-08 – 2013-05-10 (×3): 2 via NASAL
  Filled 2013-05-08: qty 16

## 2013-05-08 MED ORDER — BUPROPION HCL ER (XL) 300 MG PO TB24
300.0000 mg | ORAL_TABLET | Freq: Every day | ORAL | Status: DC
  Administered 2013-05-08 – 2013-05-10 (×3): 300 mg via ORAL
  Filled 2013-05-08 (×3): qty 1

## 2013-05-08 MED ORDER — ONDANSETRON HCL 4 MG/2ML IJ SOLN
4.0000 mg | Freq: Four times a day (QID) | INTRAMUSCULAR | Status: DC | PRN
  Administered 2013-05-10: 4 mg via INTRAVENOUS
  Filled 2013-05-08: qty 2

## 2013-05-08 MED ORDER — ALUM & MAG HYDROXIDE-SIMETH 200-200-20 MG/5ML PO SUSP
30.0000 mL | Freq: Two times a day (BID) | ORAL | Status: DC | PRN
  Administered 2013-05-08 – 2013-05-10 (×2): 30 mL via ORAL
  Filled 2013-05-08 (×2): qty 30

## 2013-05-08 MED ORDER — GABAPENTIN 300 MG PO CAPS
300.0000 mg | ORAL_CAPSULE | Freq: Every day | ORAL | Status: DC
  Administered 2013-05-09 – 2013-05-10 (×2): 300 mg via ORAL
  Filled 2013-05-08 (×2): qty 1

## 2013-05-08 MED ORDER — GABAPENTIN 300 MG PO CAPS
600.0000 mg | ORAL_CAPSULE | Freq: Two times a day (BID) | ORAL | Status: DC
  Administered 2013-05-08 – 2013-05-10 (×4): 600 mg via ORAL
  Filled 2013-05-08 (×5): qty 2

## 2013-05-08 MED ORDER — METFORMIN HCL 500 MG PO TABS
500.0000 mg | ORAL_TABLET | Freq: Two times a day (BID) | ORAL | Status: DC
  Administered 2013-05-08 – 2013-05-10 (×5): 500 mg via ORAL
  Filled 2013-05-08 (×7): qty 1

## 2013-05-08 MED ORDER — ASPIRIN EC 325 MG PO TBEC
325.0000 mg | DELAYED_RELEASE_TABLET | Freq: Every day | ORAL | Status: DC
  Administered 2013-05-08 – 2013-05-10 (×3): 325 mg via ORAL
  Filled 2013-05-08 (×3): qty 1

## 2013-05-08 MED ORDER — LORAZEPAM 0.5 MG PO TABS
0.5000 mg | ORAL_TABLET | Freq: Two times a day (BID) | ORAL | Status: DC | PRN
  Administered 2013-05-09 – 2013-05-10 (×2): 0.5 mg via ORAL
  Filled 2013-05-08 (×2): qty 1

## 2013-05-08 MED ORDER — BISOPROLOL FUMARATE 10 MG PO TABS
10.0000 mg | ORAL_TABLET | Freq: Every day | ORAL | Status: DC
  Administered 2013-05-08 – 2013-05-10 (×3): 10 mg via ORAL
  Filled 2013-05-08 (×3): qty 1

## 2013-05-08 MED ORDER — DILTIAZEM HCL ER COATED BEADS 120 MG PO CP24
120.0000 mg | ORAL_CAPSULE | Freq: Two times a day (BID) | ORAL | Status: DC
  Administered 2013-05-08 – 2013-05-10 (×5): 120 mg via ORAL
  Filled 2013-05-08 (×7): qty 1

## 2013-05-08 MED ORDER — ALBUTEROL SULFATE (5 MG/ML) 0.5% IN NEBU
2.5000 mg | INHALATION_SOLUTION | Freq: Four times a day (QID) | RESPIRATORY_TRACT | Status: DC | PRN
  Administered 2013-05-08: 2.5 mg via RESPIRATORY_TRACT
  Filled 2013-05-08: qty 0.5

## 2013-05-08 MED ORDER — ALBUTEROL SULFATE (5 MG/ML) 0.5% IN NEBU
2.5000 mg | INHALATION_SOLUTION | Freq: Four times a day (QID) | RESPIRATORY_TRACT | Status: DC
  Administered 2013-05-08: 2.5 mg via RESPIRATORY_TRACT
  Filled 2013-05-08: qty 0.5

## 2013-05-08 MED ORDER — FAMOTIDINE 20 MG PO TABS
20.0000 mg | ORAL_TABLET | Freq: Two times a day (BID) | ORAL | Status: DC
  Administered 2013-05-08 – 2013-05-10 (×5): 20 mg via ORAL
  Filled 2013-05-08 (×6): qty 1

## 2013-05-08 MED ORDER — ACETAMINOPHEN 325 MG PO TABS
650.0000 mg | ORAL_TABLET | ORAL | Status: DC | PRN
  Administered 2013-05-08 (×2): 650 mg via ORAL
  Filled 2013-05-08 (×2): qty 2

## 2013-05-08 MED ORDER — SODIUM CHLORIDE 0.9 % IV SOLN: 250.0000 mL | INTRAVENOUS | Status: DC | PRN

## 2013-05-08 MED ORDER — ENOXAPARIN SODIUM 40 MG/0.4ML ~~LOC~~ SOLN
40.0000 mg | Freq: Every day | SUBCUTANEOUS | Status: DC
  Administered 2013-05-08 – 2013-05-10 (×3): 40 mg via SUBCUTANEOUS
  Filled 2013-05-08 (×3): qty 0.4

## 2013-05-08 MED ORDER — MIRTAZAPINE 45 MG PO TABS
45.0000 mg | ORAL_TABLET | Freq: Every day | ORAL | Status: DC
  Administered 2013-05-08 – 2013-05-09 (×3): 45 mg via ORAL
  Filled 2013-05-08 (×4): qty 1

## 2013-05-08 MED ORDER — CALCIUM CARBONATE 1250 (500 CA) MG PO TABS
1250.0000 mg | ORAL_TABLET | Freq: Every day | ORAL | Status: DC
  Administered 2013-05-08 – 2013-05-10 (×3): 1250 mg via ORAL
  Filled 2013-05-08 (×5): qty 1

## 2013-05-08 MED ORDER — SIMVASTATIN 10 MG PO TABS
10.0000 mg | ORAL_TABLET | Freq: Every day | ORAL | Status: DC
  Administered 2013-05-08 – 2013-05-09 (×3): 10 mg via ORAL
  Filled 2013-05-08 (×4): qty 1

## 2013-05-08 MED ORDER — POTASSIUM CHLORIDE CRYS ER 10 MEQ PO TBCR
10.0000 meq | EXTENDED_RELEASE_TABLET | Freq: Two times a day (BID) | ORAL | Status: DC
  Administered 2013-05-08 – 2013-05-09 (×3): 10 meq via ORAL
  Filled 2013-05-08 (×4): qty 1

## 2013-05-08 MED ORDER — FUROSEMIDE 40 MG PO TABS
40.0000 mg | ORAL_TABLET | Freq: Two times a day (BID) | ORAL | Status: AC
  Administered 2013-05-08 – 2013-05-09 (×4): 40 mg via ORAL
  Filled 2013-05-08 (×4): qty 1

## 2013-05-08 MED ORDER — INSULIN ASPART 100 UNIT/ML ~~LOC~~ SOLN
0.0000 [IU] | Freq: Three times a day (TID) | SUBCUTANEOUS | Status: DC
  Administered 2013-05-08 (×3): 2 [IU] via SUBCUTANEOUS
  Administered 2013-05-09 – 2013-05-10 (×4): 1 [IU] via SUBCUTANEOUS

## 2013-05-08 MED ORDER — LORAZEPAM 0.5 MG PO TABS
0.5000 mg | ORAL_TABLET | Freq: Four times a day (QID) | ORAL | Status: DC | PRN
  Administered 2013-05-08: 0.5 mg via ORAL
  Filled 2013-05-08: qty 1

## 2013-05-08 NOTE — H&P (Addendum)
[facility] History and Physical  ROBBIN LOUGHMILLER ZOX:[id number] DOB: 26-Jun-1936 DOA: 05/07/2013  Referring physician:  EDP PCP: Benita Stabile, MD  Specialists:   Chief Complaint: SOB  HPI: [name] [name] is a 77 y.o. female with O2 dependent respiratory failure who was brought to the ED by EMS after suffering gradual SOB since at 6 pm in her home she reports having chest tightness and denies having chest pain or wheezing, or cough, or fevers or chills.  Her SOB was unrelieved by her inhaler, and when EMS arrived she was placed on CPAP with Increased Oxygen.   In the ED she was evaluated and found to have Pulmonary Edema and an elevated BNP, she was administered  40 mg IV Lasix X 1 and was weaned off the CPAP.   She was referred for medical admission.       Review of Systems: The patient denies anorexia, fever, chills, headaches, weight loss, vision loss, diplopia, dizziness, decreased hearing, rhinitis, hoarseness, chest pain, syncope, dyspnea on exertion, peripheral edema, balance deficits, cough, hemoptysis, abdominal pain, nausea, vomiting, diarrhea, constipation, hematemesis, melena, hematochezia, severe indigestion/heartburn, dysuria, hematuria, incontinence, muscle weakness, suspicious skin lesions, transient blindness, difficulty walking, depression, unusual weight change, abnormal bleeding, enlarged lymph nodes, angioedema, and breast masses.    Past Medical History  Diagnosis Date  . Hypertrophic cardiomyopathy   . Hypertension   . Osteoarthritis   . Situational mixed anxiety and depressive disorder   . Hypercholesterolemia   . Atrial fibrillation     Chronic; No longer on coumadin  . Pacemaker   . Falls frequently     coumadin stopped  . Pulmonary HTN     has had prior right heart cath in 2010; was felt that most likely due to elevated left sided pressures and would not benefit from vasodilator therapy  . Diabetes mellitus   . Pacemaker   . Oxygen dependent   . COPD  (chronic obstructive pulmonary disease)   . TIA (transient ischemic attack)     Past Surgical History  Procedure Laterality Date  . Knee arthroplasty  2011    right knee  . Tonsillectomy    . Appendectomy    . Cardiac catheterization    . Insert / replace / remove pacemaker    . Other surgical history      hysterectomy-bening    Prior to Admission medications   Medication Sig Start Date End Date Taking? Authorizing Provider  aspirin 81 MG tablet Take 81 mg by mouth daily.   Yes Historical Provider, MD  bisoprolol (ZEBETA) 10 MG tablet Take 10 mg by mouth daily.   Yes Historical Provider, MD  budesonide-formoterol (SYMBICORT) 80-4.5 MCG/ACT inhaler Inhale 2 puffs into the lungs 2 (two) times daily. 12/03/12  Yes Kalman Shan, MD  buPROPion (WELLBUTRIN XL) 300 MG 24 hr tablet Take 300 mg by mouth daily.   Yes Historical Provider, MD  calcium carbonate (OS-CAL) 600 MG TABS Take 600 mg by mouth daily.     Yes Historical Provider, MD  calcium carbonate (TUMS) 500 MG chewable tablet Chew 1 tablet by mouth daily.   Yes Historical Provider, MD  conjugated estrogens (PREMARIN) vaginal cream Place 0.625 g vaginally daily as needed. For vaginal dryness   Yes Historical Provider, MD  diltiazem (CARDIZEM CD) 120 MG 24 hr capsule Take 120 mg by mouth 2 (two) times daily.  05/19/12  Yes Cassell Clement, MD  famotidine (PEPCID) 20 MG tablet Take 20 mg by mouth 2 (two) times  daily as needed. For GERD   Yes Historical Provider, MD  fluticasone (FLONASE) 50 MCG/ACT nasal spray Place 2 sprays into the nose daily. 10/28/12 10/28/13 Yes Kalman Shan, MD  furosemide (LASIX) 40 MG tablet Take 40 mg by mouth 2 (two) times daily.  02/10/13  Yes Cassell Clement, MD  gabapentin (NEURONTIN) 300 MG capsule Take 300-600 mg by mouth 4 (four) times daily. Takes 1 tablet in the am, 2 dinner and 2 at bedtime   Yes Historical Provider, MD  HYDROcodone-acetaminophen (NORCO) 10-325 MG per tablet Take 1 tablet by mouth  every 6 (six) hours as needed. For pain   Yes Historical Provider, MD  LORazepam (ATIVAN) 0.5 MG tablet Take 0.5 mg by mouth every 6 (six) hours as needed. For anxiety   Yes Historical Provider, MD  losartan (COZAAR) 100 MG tablet Take 1 tablet (100 mg total) by mouth daily. 04/14/13  Yes Cassell Clement, MD  metFORMIN (GLUCOPHAGE) 500 MG tablet Take 500 mg by mouth 2 (two) times daily.    Yes Historical Provider, MD  mirtazapine (REMERON) 45 MG tablet Take 45 mg by mouth at bedtime.   Yes Historical Provider, MD  potassium chloride SA (K-DUR,KLOR-CON) 20 MEQ tablet Take 20 mEq by mouth daily.  09/29/12  Yes Cassell Clement, MD  PROAIR HFA 108 (90 BASE) MCG/ACT inhaler Inhale 2 puffs into the lungs every 6 (six) hours as needed. For shortness of breath/wheezing 02/11/12  Yes Historical Provider, MD  sertraline (ZOLOFT) 100 MG tablet Take 200 mg by mouth daily.    Yes Historical Provider, MD  simvastatin (ZOCOR) 10 MG tablet Take 1 tablet (10 mg total) by mouth at bedtime. 11/20/12  Yes Cassell Clement, MD  temazepam (RESTORIL) 7.5 MG capsule Take 7.5 mg by mouth at bedtime as needed. Insomnia   Yes Historical Provider, MD  OXYGEN-HELIUM IN Inhale into the lungs as directed.    Historical Provider, MD    Allergies  Allergen Reactions  . Calan (Verapamil Hcl) Other (See Comments)    Patient states it makes her "out of her mind"; bp bottoms out  . Crestor (Rosuvastatin Calcium) Other (See Comments)    Muscle pain  . Escitalopram Oxalate Other (See Comments)    Unknown   . Lipitor (Atorvastatin Calcium) Other (See Comments)    myalgia  . Lopressor (Metoprolol Tartrate) Other (See Comments)    Blood pressure bottoms out   . Norvasc (Amlodipine Besylate) Other (See Comments)    fatigue  . Nsaids   . Prednisone     SWELLING  . Sulfa Antibiotics Nausea Only and Other (See Comments)    Makes her stomach hurt  . Vimovo (Naproxen-Esomeprazole)   . Penicillins Hives and Rash    Social  History:  reports that she quit smoking about 7 years ago. Her smoking use included Cigarettes. She has a 40 pack-year smoking history. She has never used smokeless tobacco. She reports that she does not drink alcohol or use illicit drugs.     Family History  Problem Relation Age of Onset  . Other Father     died in plane crash  . Cancer Mother     lymphoma-NHL  . Coronary artery disease Grandchild     (be sure to complete)   Physical Exam:  GEN:  Pleasant well nourished and well developed elderly  77 y.o. Caucasian female  examined and in no acute distress; cooperative with exam Filed Vitals:   05/07/13 2330 05/07/13 2345 05/08/13 0000 05/08/13 0030  BP:  141/69 140/60 139/32 101/67  Pulse: 84 55 50 73  Temp:      TempSrc:      Resp: 21 15 20 26   Height:      Weight:      SpO2: 95% 99% 97% 92%   Blood pressure 101/67, pulse 73, temperature 98.6 F (37 C), temperature source Oral, resp. rate 26, height 5\' 4"  (1.626 m), weight 67.132 kg (148 lb), SpO2 92.00%. PSYCH: She is alert and oriented x4; does not appear anxious does not appear depressed; affect is normal HEENT: Normocephalic and Atraumatic, Mucous membranes pink; PERRLA; EOM intact; Fundi:  Benign;  No scleral icterus, Nares: Patent, Oropharynx: Clear, Fair Dentition, Neck:  FROM, no cervical lymphadenopathy nor thyromegaly or carotid bruit; no JVD; Breasts:: Not examined CHEST WALL: No tenderness CHEST: Normal respiration, clear to auscultation bilaterally HEART: Regular rate and rhythm; no murmurs rubs or gallops BACK: No kyphosis or scoliosis; no CVA tenderness ABDOMEN: Positive Bowel Sounds, soft non-tender; no masses, no organomegaly.   Rectal Exam: Not done EXTREMITIES: No cyanosis, clubbing or edema; no ulcerations. Genitalia: not examined PULSES: 2+ and symmetric SKIN: Normal hydration no rash or ulceration CNS: Cranial nerves 2-12 grossly intact no focal neurologic deficit    Labs on Admission:  Basic  Metabolic Panel:  Recent Labs Lab 05/07/13 2128  NA 138  K 3.9  CL 95*  CO2 27  GLUCOSE 138*  BUN 18  CREATININE 0.81  CALCIUM 10.3   Liver Function Tests:  Recent Labs Lab 05/07/13 2128  AST 39*  ALT 11  ALKPHOS 75  BILITOT 0.7  PROT 8.1  ALBUMIN 4.2   No results found for this basename: LIPASE, AMYLASE,  in the last 168 hours No results found for this basename: AMMONIA,  in the last 168 hours CBC:  Recent Labs Lab 05/07/13 2128  WBC 9.1  NEUTROABS 6.8  HGB 13.6  HCT 38.7  MCV 97.2  PLT 265   Cardiac Enzymes: No results found for this basename: CKTOTAL, CKMB, CKMBINDEX, TROPONINI,  in the last 168 hours  BNP (last 3 results)  Recent Labs  11/22/12 1425 12/11/12 1135 05/07/13 2128  PROBNP 1995.0* 283.0* 1129.0*   CBG: No results found for this basename: GLUCAP,  in the last 168 hours  Radiological Exams on Admission: Dg Chest Portable 1 View  05/07/2013   *RADIOLOGY REPORT*  Clinical Data: Respiratory distress  PORTABLE CHEST - 1 VIEW  Comparison: 11/22/2012; 09/28/2012  Findings: Grossly unchanged enlarged cardiac silhouette. Atherosclerotic calcifications within the thoracic aorta.  Stable positioning of support apparatus.  There is chronic findings of pulmonary venous congestion.  Persistent thickening along the right minor fissure.  Grossly unchanged bibasilar heterogeneous opacities, favored to represent atelectasis.  No new focal airspace opacities.  No pleural effusion or pneumothorax.  Unchanged bones.  IMPRESSION: Stable findings of cardiomegaly and chronic pulmonary venous congestion without definite acute cardiopulmonary disease.   Original Report Authenticated By: Tacey Ruiz, MD     EKG: Independently reviewed.  Paced, Afib rate 71, +PVC, No Acute S-T changes.      Assessment/Plan Active Problems:   Acute respiratory distress   Pulmonary edema, acute   CHRONIC OBSTRUCTIVE PULMONARY DISEASE, MODERATE   Atrial fibrillation    Hypertrophic cardiomyopathy   Pulmonary HTN   HYPERTENSION   Type II or unspecified type diabetes mellitus without mention of complication, uncontrolled   HYPERLIPIDEMIA    1.  Acute Respiratory Distress-  Due to Flash Pulmonary Edema, Placed on Increased  Supplemental O2, and Diurese with IV Lasix.    2.  Acute Pulmonary Edema-  Multifactorial in etiology, Has Atrial fibrillation, Pulmonary HTN,  Hypertrophic Cardiomyopathy,  IV Lasix to diurese.   Cycle troponins.   Check a D-dimer.      3.  COPD-  Albuterol Nebs.     4.  Atrial fibrillation-  Continue diltiazem and Zebeta for rate control.   Continue ASA Rx.     5.   HTN-  Continue  diltiazem  6.  DM2-  Continue Metformin, and add SSI coverage PRN.   Check HbA1C in AM.     7.  Hyperlipidemia- Continue Simvastatin.        Code Status:   FULL CODE    Family Communication:    No Family Present Disposition Plan:    Return to Home on Discharge  Time spent:  58 Minutes   Ron Parker [facility] Pager [phone]  If 7PM-7AM, please contact night-coverage [email or web address] Password [facility] 05/08/2013, 12:58 AM

## 2013-05-08 NOTE — Progress Notes (Signed)
  Echocardiogram 2D Echocardiogram has been performed.  [name] [name] 05/08/2013, 12:24 PM

## 2013-05-08 NOTE — ED Notes (Signed)
Pt placed on Non rebreathing mask due to MD order patient was not getting enough air and needed to be comfortable. Consult MD suggests for patient to use PRN nasal spray when get to floor.

## 2013-05-08 NOTE — Progress Notes (Signed)
[facility] PROGRESS NOTE  [name] [name] ZOX:[id number] DOB: 1936/04/10 DOA: 05/07/2013 PCP: Benita Stabile, MD I have seen and examined pt who is a 76yo with history of Afib, COPD, DM, HTN admitted this am by Dr Lovell Sheehan with resp distress and BNP elevated and CXR with chronic pulm congestion, d-dimer wnl. Will continue diuresis for likely diastolic CHF, and Nebs for COPD. CEs are neg and she states she is breathing better this am. Will continue current management as per Dr Lovell Sheehan, follow up on echo and resume outpt meds.   Kela Millin  [facility] Pager [phone]. If 7PM-7AM, please contact night-coverage at [email or web address], password [facility] 05/08/2013, 10:43 AM  LOS: 1 day

## 2013-05-09 DIAGNOSIS — R0609 Other forms of dyspnea: Secondary | ICD-10-CM

## 2013-05-09 DIAGNOSIS — I509 Heart failure, unspecified: Secondary | ICD-10-CM

## 2013-05-09 DIAGNOSIS — Z9981 Dependence on supplemental oxygen: Secondary | ICD-10-CM

## 2013-05-09 DIAGNOSIS — E876 Hypokalemia: Secondary | ICD-10-CM

## 2013-05-09 DIAGNOSIS — I5031 Acute diastolic (congestive) heart failure: Secondary | ICD-10-CM

## 2013-05-09 LAB — GLUCOSE, CAPILLARY
Glucose-Capillary: 125 mg/dL — ABNORMAL HIGH (ref 70–99)
Glucose-Capillary: 138 mg/dL — ABNORMAL HIGH (ref 70–99)
Glucose-Capillary: 148 mg/dL — ABNORMAL HIGH (ref 70–99)

## 2013-05-09 LAB — BASIC METABOLIC PANEL
CO2: 29 mEq/L (ref 19–32)
Calcium: 9.7 mg/dL (ref 8.4–10.5)
GFR calc non Af Amer: 64 mL/min — ABNORMAL LOW (ref >90)
Glucose, Bld: 139 mg/dL — ABNORMAL HIGH (ref 70–99)
Potassium: 3.3 mEq/L — ABNORMAL LOW (ref 3.5–5.1)
Sodium: 140 mEq/L (ref 135–145)

## 2013-05-09 MED ORDER — POTASSIUM CHLORIDE CRYS ER 20 MEQ PO TBCR
40.0000 meq | EXTENDED_RELEASE_TABLET | ORAL | Status: AC
  Administered 2013-05-09 (×2): 40 meq via ORAL
  Filled 2013-05-09 (×2): qty 2

## 2013-05-09 MED ORDER — BUDESONIDE-FORMOTEROL FUMARATE 160-4.5 MCG/ACT IN AERO
2.0000 | INHALATION_SPRAY | Freq: Two times a day (BID) | RESPIRATORY_TRACT | Status: DC
  Administered 2013-05-09 – 2013-05-10 (×3): 2 via RESPIRATORY_TRACT
  Filled 2013-05-09: qty 6

## 2013-05-09 NOTE — Progress Notes (Addendum)
[facility] PROGRESS NOTE  [name] [name] NGE:[id number] DOB: [birth year] DOA: 05/07/2013 PCP: Benita Stabile, MD  Assessment/Plan: 1.Acute Diastolic CHF Increased  -Continue Supplemental O2, and Diuresing with PO Lasix.  -Troponins neg, D-dimer neg echo with EF 60-65% 2. COPD- add symbicort, continue prn nebs -She is home O2 dependent on 3L  3. Atrial fibrillation- Continue diltiazem and Zebeta for rate control.  -Continue ASA Rx.  4. HTN- Continue diltiazem  5. DM2- Continue Metformin, and add SSI coverage PRN.  HbA1C 6.8  6. Hyperlipidemia- Continue Simvastatin.  -consult PT/OT 7. Hypokalemia -replace k  Code Status: full Family Communication: none at bedside  Disposition Plan: pending clinical course   Consultants:  none  Procedures:  echo  Antibiotics:  none  HPI/Subjective: States breathing better but still SOB with min activity  Objective: Filed Vitals:   05/09/13 0300 05/09/13 0500 05/09/13 0600 05/09/13 0900  BP:  141/62 114/55 108/54  Pulse: 72 68 64 33  Temp:  97.7 F (36.5 C)    TempSrc:  Oral    Resp: 32 16 14 15   Height:      Weight:  65 kg (143 lb 4.8 oz)    SpO2: 98% 98% 96% 100%    Intake/Output Summary (Last 24 hours) at 05/09/13 1029 Last data filed at 05/09/13 0910  Gross per 24 hour  Intake   1053 ml  Output   2150 ml  Net  -1097 ml   Filed Weights   05/07/13 2112 05/08/13 0126 05/09/13 0500  Weight: 67.132 kg (148 lb) 65.9 kg (145 lb 4.5 oz) 65 kg (143 lb 4.8 oz)   Exam:  General: alert & oriented x 3 In NAD Cardiovascular: RRR, nl S1 s2 Respiratory: decreased BS at bases Abdomen: soft +BS NT/ND, no masses palpable Extremities: No cyanosis and no edema     Data Reviewed: Basic Metabolic Panel:  Recent Labs Lab 05/07/13 2128 05/09/13 0615  NA 138 140  K 3.9 3.3*  CL 95* 100  CO2 27 29  GLUCOSE 138* 139*  BUN 18 20  CREATININE 0.81 0.87  CALCIUM 10.3 9.7   Liver Function Tests:  Recent Labs Lab  05/07/13 2128  AST 39*  ALT 11  ALKPHOS 75  BILITOT 0.7  PROT 8.1  ALBUMIN 4.2   No results found for this basename: LIPASE, AMYLASE,  in the last 168 hours No results found for this basename: AMMONIA,  in the last 168 hours CBC:  Recent Labs Lab 05/07/13 2128  WBC 9.1  NEUTROABS 6.8  HGB 13.6  HCT 38.7  MCV 97.2  PLT 265   Cardiac Enzymes:  Recent Labs Lab 05/08/13 0023 05/08/13 0355 05/08/13 1145  TROPONINI <0.30 <0.30 <0.30   BNP (last 3 results)  Recent Labs  11/22/12 1425 12/11/12 1135 05/07/13 2128  PROBNP 1995.0* 283.0* 1129.0*   CBG:  Recent Labs Lab 05/08/13 0827 05/08/13 1212 05/08/13 1741 05/08/13 2105 05/09/13 0736  GLUCAP 178* 156* 141* 105* 125*    Recent Results (from the past 240 hour(s))  MRSA PCR SCREENING     Status: None   Collection Time    05/08/13  1:20 AM      Result Value Range Status   MRSA by PCR NEGATIVE  NEGATIVE Final   Comment:            The GeneXpert MRSA Assay (FDA     approved for NASAL specimens     only), is one component of a  comprehensive MRSA colonization     surveillance program. It is not     intended to diagnose MRSA     infection nor to guide or     monitor treatment for     MRSA infections.     Studies: Dg Chest Portable 1 View  05/07/2013   *RADIOLOGY REPORT*  Clinical Data: Respiratory distress  PORTABLE CHEST - 1 VIEW  Comparison: 11/22/2012; 09/28/2012  Findings: Grossly unchanged enlarged cardiac silhouette. Atherosclerotic calcifications within the thoracic aorta.  Stable positioning of support apparatus.  There is chronic findings of pulmonary venous congestion.  Persistent thickening along the right minor fissure.  Grossly unchanged bibasilar heterogeneous opacities, favored to represent atelectasis.  No new focal airspace opacities.  No pleural effusion or pneumothorax.  Unchanged bones.  IMPRESSION: Stable findings of cardiomegaly and chronic pulmonary venous congestion without definite  acute cardiopulmonary disease.   Original Report Authenticated By: Tacey Ruiz, MD    Scheduled Meds: . aspirin EC  325 mg Oral Daily  . bisoprolol  10 mg Oral Daily  . budesonide-formoterol  2 puff Inhalation BID  . buPROPion  300 mg Oral Daily  . calcium carbonate  1,250 mg Oral Q breakfast  . diltiazem  120 mg Oral BID  . enoxaparin (LOVENOX) injection  40 mg Subcutaneous Daily  . famotidine  20 mg Oral BID  . fluticasone  2 spray Each Nare Daily  . furosemide  40 mg Oral BID  . gabapentin  300 mg Oral Daily  . gabapentin  600 mg Oral BID  . insulin aspart  0-5 Units Subcutaneous QHS  . insulin aspart  0-9 Units Subcutaneous TID WC  . metFORMIN  500 mg Oral BID  . mirtazapine  45 mg Oral QHS  . potassium chloride  40 mEq Oral Q4H  . sertraline  200 mg Oral Daily  . simvastatin  10 mg Oral QHS  . sodium chloride  3 mL Intravenous Q12H   Continuous Infusions:   Active Problems:   HYPERLIPIDEMIA   HYPERTENSION   CHRONIC OBSTRUCTIVE PULMONARY DISEASE, MODERATE   Atrial fibrillation   Hypertrophic cardiomyopathy   Type II or unspecified type diabetes mellitus without mention of complication, uncontrolled   Pulmonary HTN   Acute respiratory distress   Pulmonary edema, acute    Time spent: 35    [facility]  [facility] Pager [phone]. If 7PM-7AM, please contact night-coverage at [email or web address], password [facility] 05/09/2013, 10:29 AM  LOS: 2 days

## 2013-05-09 NOTE — Progress Notes (Signed)
Received to unit.  Alert and oriented.  IV site unremarkable.  Respiratory exchange even and unlabored.  Skin warm and dry to touch.

## 2013-05-10 ENCOUNTER — Ambulatory Visit: Payer: Medicare Other | Admitting: Neurology

## 2013-05-10 LAB — GLUCOSE, CAPILLARY
Glucose-Capillary: 148 mg/dL — ABNORMAL HIGH (ref 70–99)
Glucose-Capillary: 98 mg/dL (ref 70–99)

## 2013-05-10 LAB — BASIC METABOLIC PANEL
Chloride: 100 mEq/L (ref 96–112)
GFR calc Af Amer: 72 mL/min — ABNORMAL LOW (ref >90)
GFR calc non Af Amer: 62 mL/min — ABNORMAL LOW (ref >90)
Potassium: 3.8 mEq/L (ref 3.5–5.1)

## 2013-05-10 MED ORDER — BUDESONIDE-FORMOTEROL FUMARATE 160-4.5 MCG/ACT IN AERO: 2.0000 | INHALATION_SPRAY | Freq: Two times a day (BID) | RESPIRATORY_TRACT | Status: DC

## 2013-05-10 NOTE — Evaluation (Signed)
Physical Therapy Evaluation Patient Details Name: [name] [name] MRN: [id number] DOB: [birth year] Today's Date: 05/10/2013 Time: 4098-1191 PT Time Calculation (min): 22 min  PT Assessment / Plan / Recommendation History of Present Illness  admitted for progressive SOB  Clinical Impression  Pt lives alone and has c/o of dizziness and being off balance. Pt with noted fall 3 years ago hitting back of her head. Pt had gone to vesitbular rehab before. Recommending a vestibular follow up at out patient. Pt also had a neurology outpatient appt today and also recommending rescheduling that appt. Pt safe to d/c home with use of RW and sister to come stay with her for a few days. Pt with good floor plan and handicapped accessible home. Pt safe to d/c home when medically stable.     PT Assessment  Patient needs continued PT services    Follow Up Recommendations  Outpatient PT (for vestibular rehab)    Does the patient have the potential to tolerate intense rehabilitation      Barriers to Discharge Decreased caregiver support      Equipment Recommendations  None recommended by PT    Recommendations for Other Services     Frequency Min 3X/week    Precautions / Restrictions Precautions Precautions: Fall Restrictions Weight Bearing Restrictions: No   Pertinent Vitals/Pain Mild cervical pain during head turns. Pt feels she has arthritis      Mobility  Bed Mobility Bed Mobility: Supine to Sit Supine to Sit: 6: Modified independent (Device/Increase time);HOB elevated Details for Bed Mobility Assistance: pt safe Transfers Transfers: Sit to Stand Sit to Stand: 5: Supervision;With upper extremity assist;From bed Details for Transfer Assistance: safe technique Ambulation/Gait Ambulation/Gait Assistance: 4: Min guard;5: Supervision Ambulation Distance (Feet): 150 Feet Assistive device: None;Rolling walker Ambulation/Gait Assistance Details: initially amb without RW however pt reaching for  objects to hold onto and unsteady requiring min assist to amb safely. Pt supervision with RW. pt with good walker management. Gait Pattern: Step-through pattern;Decreased step length - right;Decreased step length - left Gait velocity: guarded,cautious General Gait Details: pt with report of mild dizziness Stairs: No    Exercises     PT Diagnosis: Difficulty walking  PT Problem List: Decreased activity tolerance;Decreased balance;Decreased mobility PT Treatment Interventions: Gait training;Stair training;Functional mobility training;Therapeutic activities;Therapeutic exercise;Balance training     PT Goals(Current goals can be found in the care plan section) Acute Rehab PT Goals Patient Stated Goal: home PT Goal Formulation: With patient Time For Goal Achievement: 05/17/13 Potential to Achieve Goals: Good  Visit Information  Last PT Received On: 05/10/13 Assistance Needed: +1 History of Present Illness: admitted for progressive SOB       Prior Functioning  Home Living Family/patient expects to be discharged to:: Private residence Living Arrangements: Alone Available Help at Discharge: Family;Available PRN/intermittently Type of Home: House Home Access: Ramped entrance Home Layout: One level Home Equipment: Grab bars - toilet;Grab bars - tub/shower;Shower seat;Hand held Engineer, civil (consulting) - single point Prior Function Level of Independence: Independent with assistive device(s) Comments: pt uses cane and/or walker PRN Communication Communication: No difficulties Dominant Hand: Right    Cognition  Cognition Arousal/Alertness: Awake/alert Behavior During Therapy: WFL for tasks assessed/performed Overall Cognitive Status: Within Functional Limits for tasks assessed    Extremity/Trunk Assessment Upper Extremity Assessment Upper Extremity Assessment: Overall WFL for tasks assessed Lower Extremity Assessment Lower Extremity Assessment: Overall WFL for tasks assessed Cervical  / Trunk Assessment Cervical / Trunk Assessment: Normal   Balance    End of  Session PT - End of Session Equipment Utilized During Treatment: Gait belt Activity Tolerance: Patient tolerated treatment well Patient left: in bed;with call bell/phone within reach Nurse Communication: Mobility status  GP     Marcene Brawn 05/10/2013, 10:24 AM   Lewis Shock, PT, DPT Pager #: [phone] Office #: [phone]

## 2013-05-10 NOTE — Care Management Note (Addendum)
    Page 1 of 2   05/10/2013     3:15:50 PM   CARE MANAGEMENT NOTE 05/10/2013  Patient:  [name] [name], [name] [name]   Account Number:  [id number]  Date Initiated:  05/10/2013  Documentation initiated by:  [facility]  Subjective/Objective Assessment:   77 y.o. female with O2 dependent respiratory failure who was brought to the ED by EMS after suffering gradual SOB/ home alone     Action/Plan:   diurese/ home with home health   Anticipated DC Date:  05/10/2013   Anticipated DC Plan:  HOME W HOME HEALTH SERVICES      DC Planning Services  CM consult      Choice offered to / List presented to:  C-1 Patient        HH arranged  HH-1 RN  HH-10 DISEASE MANAGEMENT      HH agency  [facility]   Status of service:  Completed, signed off Medicare Important Message given?   (If response is "NO", the following Medicare IM given date fields will be blank) Date Medicare IM given:   Date Additional Medicare IM given:    Discharge Disposition:    Per UR Regulation:    If discussed at Long Length of Stay Meetings, dates discussed:    Comments:  05/10/13 1503.Marland KitchenMarland KitchenCAMELLIA WOOD, RN, BSN, [employer] [phone] NCM FAXED ORDER TO [facility]  ORIGINAL FORMS GIVEN TO PT.  05/10/13 1015.Marland KitchenMarland KitchenOletta Cohn, RN, BSN, [employer] [phone] Spoke with pt at bedside concerning discharge planning of home health services. Offered list of home health agencies. Pt chose [facility] to render services.  Hilda Lias of [facility] notified.  No DME needs identified at this time.

## 2013-05-10 NOTE — Progress Notes (Signed)
Patient evaluated for community based chronic disease management services with [facility] Care Management Program as a benefit of patient's [employer]. Patient will receive a post discharge transition of care call and will be evaluated for monthly home visits for assessments and CHF/COPD disease process education. Spoke with patient at bedside to explain [facility] Care Management services.  Patient's cell number is [phone] and written consents have been obtained.  She lives alone and continues to drive.  She is very independent minded.  She has a Electrical engineer that calls her periodically.  She also has a Careers adviser.  She indicated that she weighs daily at home.  Left contact information and [facility] literature at bedside. Made inpatient Case Manager aware that [facility] Care Management following. Of note, [facility] Care Management services does not replace or interfere with any services that are arranged by inpatient case management or social work.  For additional questions or referrals please contact Anibal Henderson BSN RN [facility] Liaison at [phone].

## 2013-05-10 NOTE — Discharge Summary (Addendum)
Physician Discharge Summary  [name] [name] YNW:[id number] DOB: 21-Apr-1936 DOA: 05/07/2013  PCP: Benita Stabile, MD  Admit date: 05/07/2013 Discharge date: 05/10/2013  Time spent: >30 minutes  Recommendations for Outpatient Follow-up:      Follow-up Information   Follow up with Benita Stabile, MD. (in 1-2weeks, call for appt upon discharge)    Contact information:   [facility] [address] [name] [address] [phone]       Please follow up. (Reschedule Neuro appt for 1st available as directed)       Discharge Diagnoses:  Active Problems:   HYPERLIPIDEMIA   HYPERTENSION   CHRONIC OBSTRUCTIVE PULMONARY DISEASE, MODERATE   Atrial fibrillation   Hypertrophic cardiomyopathy   Type II or unspecified type diabetes mellitus without mention of complication, uncontrolled   Pulmonary HTN   Acute respiratory distress   Pulmonary edema, acute   Discharge Condition: improved/stable  Diet recommendation: MODIFIED CARB 2g NA  Filed Weights   05/08/13 0126 05/09/13 0500 05/10/13 0531  Weight: 65.9 kg (145 lb 4.5 oz) 65 kg (143 lb 4.8 oz) 65 kg (143 lb 4.8 oz)    History of present illness:  [name] [name] is a 77 y.o. female with O2 dependent respiratory failure who was brought to the ED by EMS after suffering gradual SOB since at 6 pm in her home she reports having chest tightness and denies having chest pain or wheezing, or cough, or fevers or chills. Her SOB was unrelieved by her inhaler, and when EMS arrived she was placed on CPAP with Increased Oxygen. In the ED she was evaluated and found to have Pulmonary Edema and an elevated BNP, she was administered 40 mg IV Lasix X 1 and was weaned off the CPAP. She was referred for medical admission.   Hospital Course:  1.Acute Diastolic CHF  -As discussed above, upon admission BNP was elevated at 1129, and chest x-ray showed vascular congestion .patient was initially diuresed with IV  Lasix and this was changed to by mouth Lasix -Troponins neg, D-dimer neg   -A 2-D echo was done and revealed an EF of 60-65% -She improved clinically with above treatment and has been satting well on her usual home O2 of 3 L nasal cannula -She is medically stable for discharge at this time and is to follow up outpatient 2. COPD Exacerbation- her Symbicort was resumed in the hospital-change to the 160/4.5, and continued prn nebs  -She is home O2 dependent on 3L, she is to continue this upon the  3. Atrial fibrillation- Continue diltiazem and Zebeta for rate control.  -Continue ASA Rx.  4. HTN- Continue diltiazem  5. DM2- Continue Metformin upon discharge. HbA1C 6.8  6. Hyperlipidemia- Continue Simvastatin.  7. Hypokalemia  Resolved, her potassium was replaced in the hospital  PTwas consulted saw patient in the hospital and recommended outpatient PT for vestibular, she is to follow up outpatient Consultants:  none Procedures:  echo  Study Conclusions  - Left ventricle: The cavity size was normal. There was mild concentric hypertrophy. Systolic function was normal. The estimated ejection fraction was in the range of 60% to 65%. Wall motion was normal; there were no regional wall motion abnormalities. - Aortic valve: A bicuspid morphology cannot be excluded. Mild thickening and calcification. - Mitral valve: Mild to moderate regurgitation. - Left atrium: The atrium was mildly dilated. - Right atrium: The atrium was mildly dilated. - Atrial septum: No defect or patent foramen ovale was identified. -  Tricuspid valve: Moderate regurgitation. - Pulmonary arteries: PA peak pressure: 60mm Hg (S). Impressions:    Discharge Exam: Filed Vitals:   05/09/13 2042 05/09/13 2110 05/10/13 0531 05/10/13 1024  BP: 163/78  126/69 136/72  Pulse: 76  65 76  Temp: 97.3 F (36.3 C)  98.2 F (36.8 C) 98.2 F (36.8 C)  TempSrc: Oral  Oral Oral  Resp: 20  20   Height:      Weight:   65 kg  (143 lb 4.8 oz)   SpO2: 95% 97% 100% 100%    Exam:  General: alert & oriented x 3 In NAD  Cardiovascular: RRR, nl S1 s2  Respiratory: decreased BS at bases  Abdomen: soft +BS NT/ND, no masses palpable  Extremities: No cyanosis and no edema    Discharge Instructions  Discharge Orders   Future Appointments Provider Department Dept Phone   07/26/2013 10:50 AM [facility] Main [facility] [place]) [phone]   Future Orders Complete By Expires     Diet Carb Modified  As directed     Discharge instructions  As directed     Comments:      Continue home O2, 3L  As previously    Increase activity slowly  As directed         Medication List    STOP taking these medications       budesonide-formoterol 80-4.5 MCG/ACT inhaler  Commonly known as:  SYMBICORT  Replaced by:  budesonide-formoterol 160-4.5 MCG/ACT inhaler     calcium carbonate 600 MG Tabs  Commonly known as:  OS-CAL      TAKE these medications       aspirin 81 MG tablet  Take 81 mg by mouth daily.     bisoprolol 10 MG tablet  Commonly known as:  ZEBETA  Take 10 mg by mouth daily.     budesonide-formoterol 160-4.5 MCG/ACT inhaler  Commonly known as:  SYMBICORT  Inhale 2 puffs into the lungs 2 (two) times daily.     buPROPion 300 MG 24 hr tablet  Commonly known as:  WELLBUTRIN XL  Take 300 mg by mouth daily.     conjugated estrogens vaginal cream  Commonly known as:  PREMARIN  Place 0.625 g vaginally daily as needed. For vaginal dryness     diltiazem 120 MG 24 hr capsule  Commonly known as:  CARDIZEM CD  Take 120 mg by mouth 2 (two) times daily.     famotidine 20 MG tablet  Commonly known as:  PEPCID  Take 20 mg by mouth 2 (two) times daily as needed. For GERD     fluticasone 50 MCG/ACT nasal spray  Commonly known as:  FLONASE  Place 2 sprays into the nose daily.     furosemide 40 MG tablet  Commonly known as:  LASIX  Take 40 mg by mouth 2 (two) times daily.      gabapentin 300 MG capsule  Commonly known as:  NEURONTIN  Take 300-600 mg by mouth 4 (four) times daily. Takes 1 tablet in the am, 2 dinner and 2 at bedtime     HYDROcodone-acetaminophen 10-325 MG per tablet  Commonly known as:  NORCO  Take 1 tablet by mouth every 6 (six) hours as needed. For pain     LORazepam 0.5 MG tablet  Commonly known as:  ATIVAN  Take 0.5 mg by mouth every 6 (six) hours as needed. For anxiety     losartan 100 MG tablet  Commonly known as:  COZAAR  Take 1 tablet (100 mg total) by mouth daily.     metFORMIN 500 MG tablet  Commonly known as:  GLUCOPHAGE  Take 500 mg by mouth 2 (two) times daily.     mirtazapine 45 MG tablet  Commonly known as:  REMERON  Take 45 mg by mouth at bedtime.     OXYGEN-HELIUM IN  Inhale into the lungs as directed.     potassium chloride SA 20 MEQ tablet  Commonly known as:  K-DUR,KLOR-CON  Take 20 mEq by mouth daily.     PROAIR HFA 108 (90 BASE) MCG/ACT inhaler  Generic drug:  albuterol  Inhale 2 puffs into the lungs every 6 (six) hours as needed. For shortness of breath/wheezing     sertraline 100 MG tablet  Commonly known as:  ZOLOFT  Take 200 mg by mouth daily.     simvastatin 10 MG tablet  Commonly known as:  ZOCOR  Take 1 tablet (10 mg total) by mouth at bedtime.     temazepam 7.5 MG capsule  Commonly known as:  RESTORIL  Take 7.5 mg by mouth at bedtime as needed. Insomnia     TUMS 500 MG chewable tablet  Generic drug:  calcium carbonate  Chew 1 tablet by mouth daily.       Allergies  Allergen Reactions  . Calan (Verapamil Hcl) Other (See Comments)    Patient states it makes her "out of her mind"; bp bottoms out  . Crestor (Rosuvastatin Calcium) Other (See Comments)    Muscle pain  . Escitalopram Oxalate Other (See Comments)    Unknown   . Lipitor (Atorvastatin Calcium) Other (See Comments)    myalgia  . Lopressor (Metoprolol Tartrate) Other (See Comments)    Blood pressure bottoms out   . Norvasc  (Amlodipine Besylate) Other (See Comments)    fatigue  . Nsaids   . Prednisone     SWELLING  . Sulfa Antibiotics Nausea Only and Other (See Comments)    Makes her stomach hurt  . Vimovo (Naproxen-Esomeprazole)   . Penicillins Hives and Rash       Follow-up Information   Follow up with Benita Stabile, MD. (in 1-2weeks, call for appt upon discharge)    Contact information:   [facility] [address] [name] [address] [phone]       Please follow up. (Reschedule Neuro appt for 1st available as directed)        The results of significant diagnostics from this hospitalization (including imaging, microbiology, ancillary and laboratory) are listed below for reference.    Significant Diagnostic Studies: Dg Chest Portable 1 View  05/07/2013   *RADIOLOGY REPORT*  Clinical Data: Respiratory distress  PORTABLE CHEST - 1 VIEW  Comparison: 11/22/2012; 09/28/2012  Findings: Grossly unchanged enlarged cardiac silhouette. Atherosclerotic calcifications within the thoracic aorta.  Stable positioning of support apparatus.  There is chronic findings of pulmonary venous congestion.  Persistent thickening along the right minor fissure.  Grossly unchanged bibasilar heterogeneous opacities, favored to represent atelectasis.  No new focal airspace opacities.  No pleural effusion or pneumothorax.  Unchanged bones.  IMPRESSION: Stable findings of cardiomegaly and chronic pulmonary venous congestion without definite acute cardiopulmonary disease.   Original Report Authenticated By: Tacey Ruiz, MD    Microbiology: Recent Results (from the past 240 hour(s))  MRSA PCR SCREENING     Status: None   Collection Time    05/08/13  1:20 AM  Result Value Range Status   MRSA by PCR NEGATIVE  NEGATIVE Final   Comment:            The GeneXpert MRSA Assay (FDA     approved for NASAL specimens     only), is one component of a     comprehensive MRSA  colonization     surveillance program. It is not     intended to diagnose MRSA     infection nor to guide or     monitor treatment for     MRSA infections.     Labs: Basic Metabolic Panel:  Recent Labs Lab 05/07/13 2128 05/09/13 0615 05/10/13 0610  NA 138 140 139  K 3.9 3.3* 3.8  CL 95* 100 100  CO2 27 29 30   GLUCOSE 138* 139* 158*  BUN 18 20 20   CREATININE 0.81 0.87 0.89  CALCIUM 10.3 9.7 9.8   Liver Function Tests:  Recent Labs Lab 05/07/13 2128  AST 39*  ALT 11  ALKPHOS 75  BILITOT 0.7  PROT 8.1  ALBUMIN 4.2   No results found for this basename: LIPASE, AMYLASE,  in the last 168 hours No results found for this basename: AMMONIA,  in the last 168 hours CBC:  Recent Labs Lab 05/07/13 2128  WBC 9.1  NEUTROABS 6.8  HGB 13.6  HCT 38.7  MCV 97.2  PLT 265   Cardiac Enzymes:  Recent Labs Lab 05/08/13 0023 05/08/13 0355 05/08/13 1145  TROPONINI <0.30 <0.30 <0.30   BNP: BNP (last 3 results)  Recent Labs  11/22/12 1425 12/11/12 1135 05/07/13 2128  PROBNP 1995.0* 283.0* 1129.0*   CBG:  Recent Labs Lab 05/09/13 0736 05/09/13 1131 05/09/13 1746 05/09/13 2101 05/10/13 0638  GLUCAP 125* 138* 148* 137* 148*       Signed:  Michaella Imai C  [facility] 05/10/2013, 11:26 AM

## 2013-05-10 NOTE — Progress Notes (Signed)
Utilization Review Completed Dantavious Snowball J. Joene Gelder, RN, BSN, NCM [phone]  

## 2013-05-10 NOTE — Progress Notes (Signed)
1615 discharge instructions and prescription given to pt and daughter .balized understanding . Wheeled to lobby by NT

## 2013-05-10 NOTE — Evaluation (Signed)
Occupational Therapy Evaluation Patient Details Name: [name] [name] MRN: [id number] DOB: 1936/06/23 Today's Date: 05/10/2013 Time: 4098-1191 OT Time Calculation (min): 30 min  OT Assessment / Plan / Recommendation History of present illness Pt admitted for progressive SOB and also reports history of vertigo and falls.   Clinical Impression   Pt is overall min guard to min assist for selfcare tasks without use of DME.  Pt has shower seat and grab bars at home next to the toilet and in the shower.  She plans to stay with her sister initially when she leaves.  Feel pt will need initial use of a RW for safety as well as the shower seat.  Recommend outpatient PT to further assess vestibular issues and balance.  No further OT needs.  Pt has all DME already.    OT Assessment  Patient does not need any further OT services    Follow Up Recommendations  No OT follow up       Equipment Recommendations  None recommended by OT          Precautions / Restrictions Precautions Precautions: Fall Restrictions Weight Bearing Restrictions: No   Pertinent Vitals/Pain O2 98% on 4Ls nasal cannula, decreasing to 93% with activity    ADL  Eating/Feeding: Simulated;Independent Where Assessed - Eating/Feeding: Chair Grooming: Performed;Supervision/safety Where Assessed - Grooming: Unsupported standing Upper Body Bathing: Simulated;Set up Where Assessed - Upper Body Bathing: Unsupported sitting Lower Body Bathing: Simulated;Supervision/safety Where Assessed - Lower Body Bathing: Supported sit to stand Upper Body Dressing: Simulated;Set up Where Assessed - Upper Body Dressing: Unsupported sitting Lower Body Dressing: Simulated;Supervision/safety Where Assessed - Lower Body Dressing: Supported sit to stand Toilet Transfer: Simulated;Min Pension scheme manager Method: Other (comment) (ambulate without assistive device) Acupuncturist: Comfort height toilet Toileting - Clothing Manipulation and  Hygiene: Performed;Min guard Where Assessed - Engineer, mining and Hygiene: Sit to stand from 3-in-1 or toilet Tub/Shower Transfer: Simulated;Min guard Tub/Shower Transfer Method: Ambulating Equipment Used: Gait belt Transfers/Ambulation Related to ADLs: Pt is overall min guard to min assist level for dynamic balance during functional mobility.  Encouraged pt to use her RW and to also use her shower seat for safety.  Pt plans to stay with her sister at initial discharge.   ADL Comments: Pt needs min guard to supervision for performance of selfcare tasks using appropriate DME.  Reports dizziness with positional changes especially supine to sit and when reaching down to the floor to retrieve items.  Did not not any nystagmus with rolling side to side in the bed or with transition supine to sit.  Pt did report slgith dizziness with reaching down to the floor however.        Visit Information  Last OT Received On: 05/10/13 Assistance Needed: +1 History of Present Illness: Pt admitted for progressive SOB and also reports history of vertigo and falls.       Prior Functioning     Home Living Family/patient expects to be discharged to:: Private residence Living Arrangements: Alone Available Help at Discharge: Family;Available PRN/intermittently Type of Home: House Home Access: Ramped entrance Home Layout: One level Home Equipment: Grab bars - toilet;Grab bars - tub/shower;Shower seat;Hand held Engineer, civil (consulting) - single point Prior Function Level of Independence: Independent with assistive device(s) Comments: pt uses cane and/or walker PRN Communication Communication: No difficulties Dominant Hand: Right         Vision/Perception Vision - History Baseline Vision: No visual deficits Patient Visual Report: No change from baseline Vision -  Assessment Eye Alignment: Within Functional Limits Vision Assessment: Vision not tested Perception Perception: Within Functional  Limits Praxis Praxis: Intact   Cognition  Cognition Arousal/Alertness: Awake/alert Behavior During Therapy: WFL for tasks assessed/performed Overall Cognitive Status: Within Functional Limits for tasks assessed    Extremity/Trunk Assessment Upper Extremity Assessment Upper Extremity Assessment: Overall WFL for tasks assessed Lower Extremity Assessment Lower Extremity Assessment: Overall WFL for tasks assessed Cervical / Trunk Assessment Cervical / Trunk Assessment: Normal     Mobility Bed Mobility Bed Mobility: Supine to Sit Supine to Sit: 4: Min assist;HOB flat Transfers Transfers: Sit to Stand;Stand to Sit Sit to Stand: 5: Supervision;With upper extremity assist;From bed Stand to Sit: 5: Supervision;To bed;With upper extremity assist        Balance Balance Balance Assessed: Yes Dynamic Standing Balance Dynamic Standing - Balance Support: No upper extremity supported Dynamic Standing - Level of Assistance: 4: Min assist High Level Balance High Level Balance Activites: Direction changes;Turns;Sudden stops;Head turns High Level Balance Comments: Pt with frequent LOB requiring min assist to selfcorrect, especially with head turns and looking up with mobility.  Pt also with LOB when attempting to step over a given barrier.     End of Session OT - End of Session Equipment Utilized During Treatment: Gait belt Activity Tolerance: Patient tolerated treatment well Patient left: in chair;with call bell/phone within reach;with family/visitor present Nurse Communication: Mobility status     [name] [name] OTR/L Pager number [phone] 05/10/2013, 2:09 PM

## 2013-05-13 ENCOUNTER — Telehealth: Payer: Self-pay | Admitting: Internal Medicine

## 2013-05-13 NOTE — Telephone Encounter (Signed)
Spoke with patient, patient aware Nothing further needed at this time

## 2013-05-13 NOTE — Telephone Encounter (Signed)
Spoke with patient, patient was in hospital over the weekend Started on symbicort 160 Patient wants to know if this is ok Dr. Marchelle Gearing please advise, thank you    Medication List     STOP taking these medications       budesonide-formoterol 80-4.5 MCG/ACT inhaler    Commonly known as: SYMBICORT    Replaced by: budesonide-formoterol 160-4.5 MCG/ACT inhaler    calcium carbonate 600 MG Tabs    Commonly known as: OS-CAL

## 2013-05-13 NOTE — Telephone Encounter (Signed)
That is fine 

## 2013-05-14 IMAGING — CR DG LUMBAR SPINE COMPLETE 4+V
5 series · 5 of 5 positions shown · non-contrast
Comparison: Lumbar spine films of 04/18/2011

CLINICAL DATA: Low back and left hip and leg pain

LUMBAR SPINE - COMPLETE 4+ VIEW

[t l-spine a.p.]
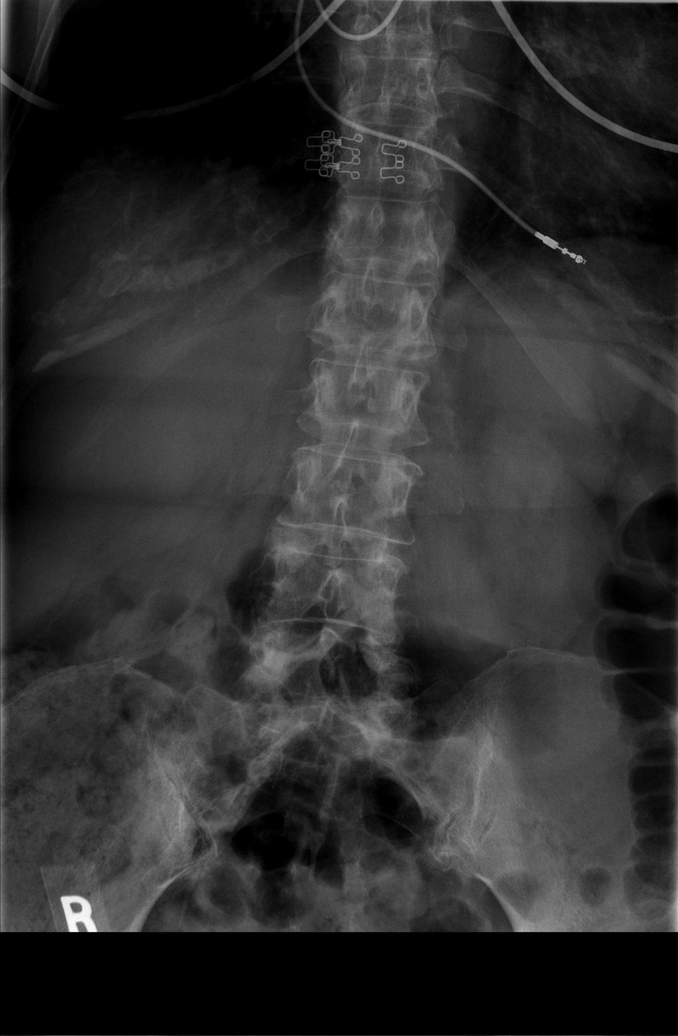

[t l-spine oblique exposure (1 of 2)]
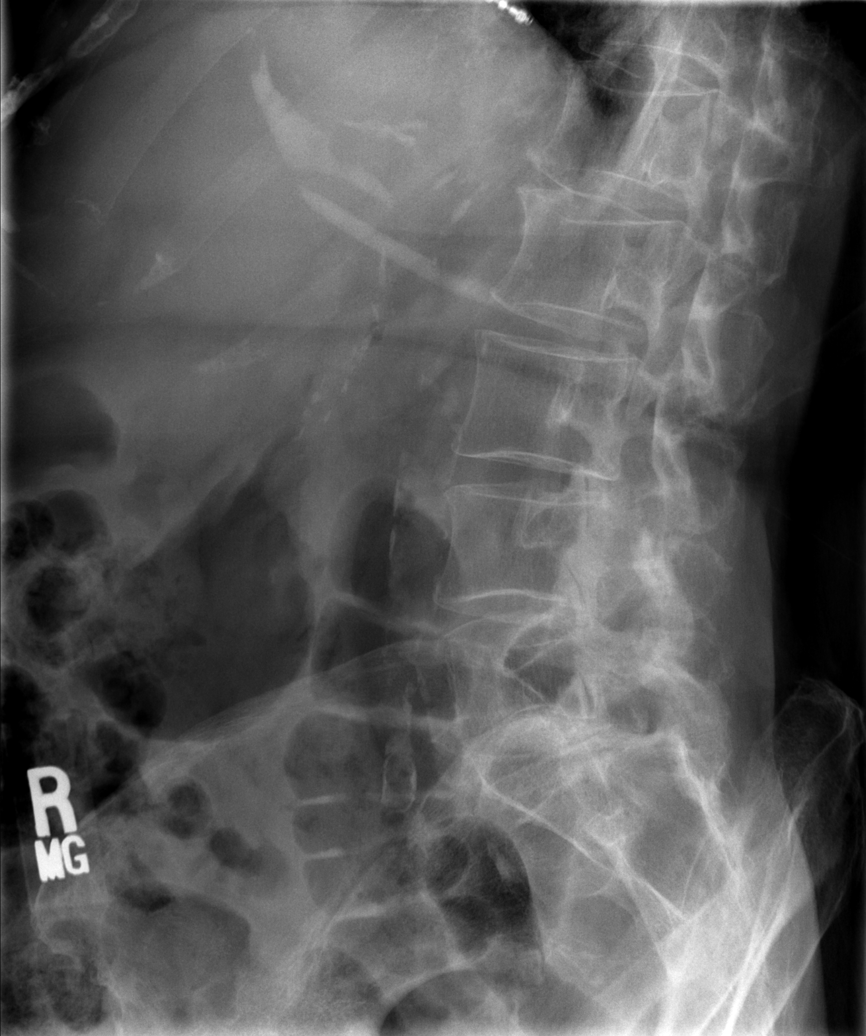

[t l-spine oblique exposure (2 of 2)]
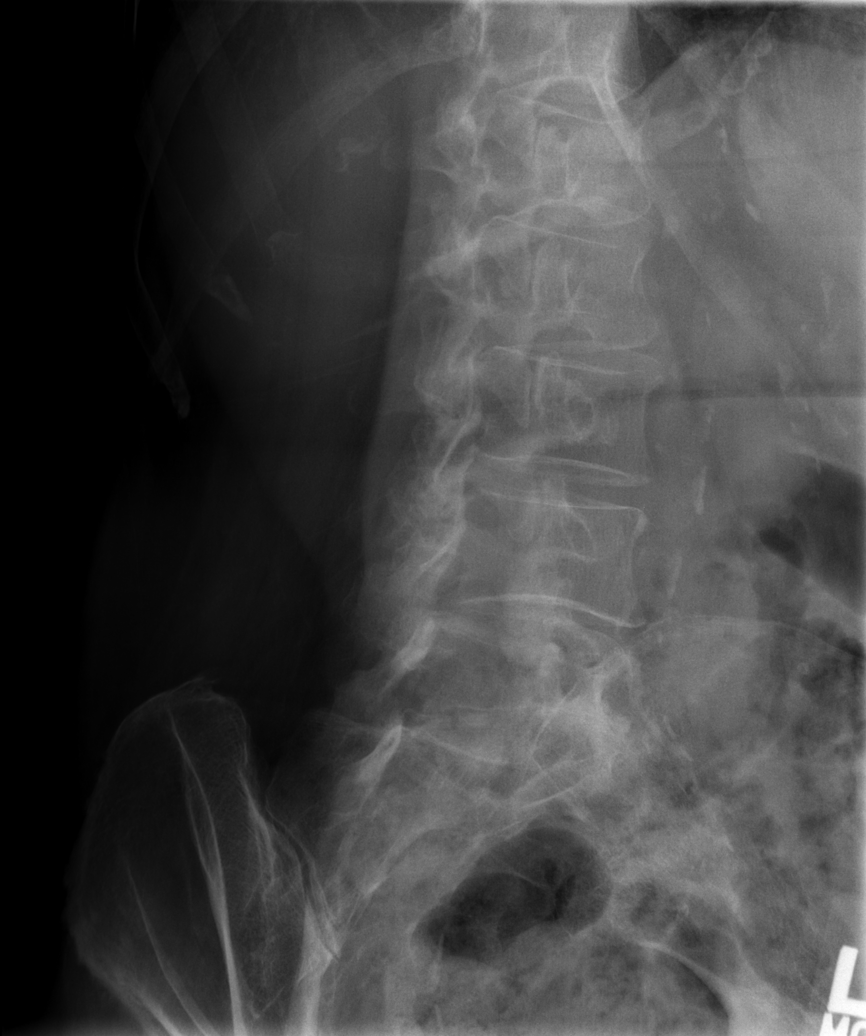

[t l-spine lat]
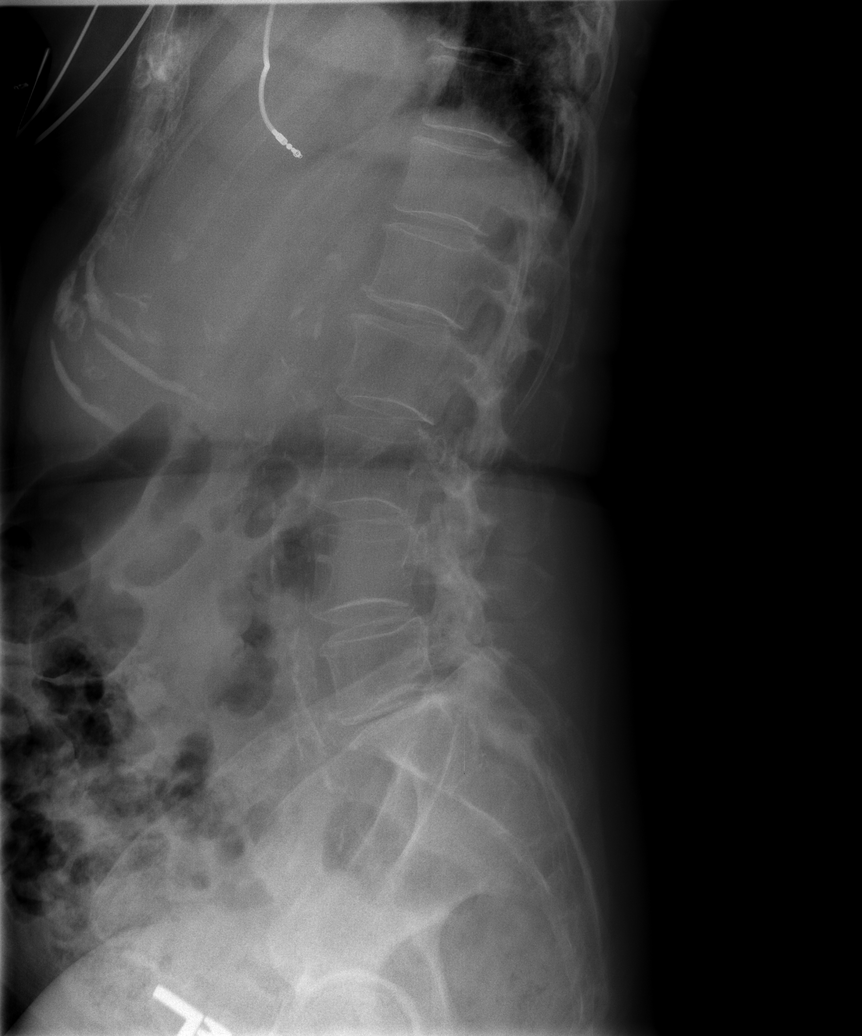

[t l-spine l5-s1 spot]
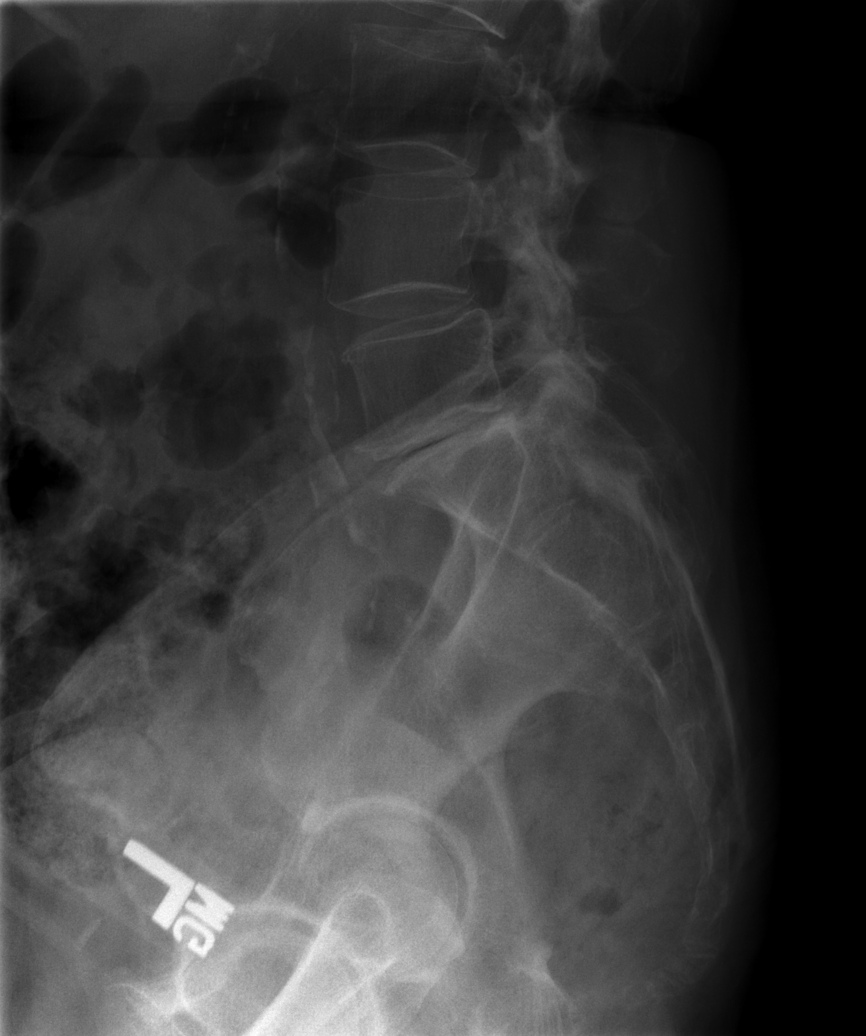

[5 of 5 positions shown; findings below may reference images not displayed]

FINDINGS: The lumbar vertebrae are in normal alignment.  There is
degenerative disc disease at the L5 S1 level with loss of disc
space, sclerosis, and spurring.  The remainder of disc spaces
appear normal.  The bones are diffusely osteopenic.  No acute
compression deformity is seen.  There is degenerative change
involving the facet joints of L4-5 and L5-S1.  The SI joints appear
normal.
IMPRESSION: 1.  Degenerative disc disease at L5-S1.
2.  Normal alignment.  Diffuse osteopenia.

## 2013-05-18 ENCOUNTER — Encounter (HOSPITAL_COMMUNITY)
Admission: RE | Admit: 2013-05-18 | Discharge: 2013-05-18 | Disposition: A | Discharge status: Home / Self Care | Payer: Medicare Other | Source: Ambulatory Visit | Attending: Internal Medicine | Admitting: Internal Medicine

## 2013-05-18 ENCOUNTER — Encounter (HOSPITAL_COMMUNITY): Payer: Self-pay

## 2013-05-18 DIAGNOSIS — I5032 Chronic diastolic (congestive) heart failure: Secondary | ICD-10-CM | POA: Insufficient documentation

## 2013-05-18 DIAGNOSIS — Z5189 Encounter for other specified aftercare: Secondary | ICD-10-CM | POA: Insufficient documentation

## 2013-05-18 DIAGNOSIS — Z9981 Dependence on supplemental oxygen: Secondary | ICD-10-CM | POA: Insufficient documentation

## 2013-05-18 DIAGNOSIS — E785 Hyperlipidemia, unspecified: Secondary | ICD-10-CM | POA: Insufficient documentation

## 2013-05-18 DIAGNOSIS — J9610 Chronic respiratory failure, unspecified whether with hypoxia or hypercapnia: Secondary | ICD-10-CM | POA: Insufficient documentation

## 2013-05-18 DIAGNOSIS — I2789 Other specified pulmonary heart diseases: Secondary | ICD-10-CM | POA: Insufficient documentation

## 2013-05-18 DIAGNOSIS — I509 Heart failure, unspecified: Secondary | ICD-10-CM | POA: Insufficient documentation

## 2013-05-18 DIAGNOSIS — J441 Chronic obstructive pulmonary disease with (acute) exacerbation: Secondary | ICD-10-CM | POA: Insufficient documentation

## 2013-05-18 HISTORY — DX: Heart failure, unspecified: I50.9

## 2013-05-18 NOTE — Progress Notes (Signed)
Mrs. Waldrep came to Pulmonary Rehab today for orientation.  She did get her appointment date confused, we will continue with the orientation since she has arrived. She came accompanied by guest services and in a wheelchair.  She is using portable oxygen @ 2Lpulsed.  Appears anxious, figidity  trying to get her oxygen tubing in place in her bag.  She was reassured, given time.  She completed the necessary paper forms for pre orientation.  She is pale, appears anxious, skin warm and dry.  Does not appear dyspneic as she is sitting by desk.  Breath sounds throughout, normal.  Her focus during our conversation is her depressive state due to the loss of her husband 2 years ago.  She state she has children, however they do not live near, the do call every day.  She wishes they lived next door, she has been so  Lonely.  She does get out some, goes to church but mostly stays at home. She is able to maintain with some assistance.  Does not cook much because it is to hard to cook for one person.  She has had a psychiatric evaluation for her depression, is on medication and has a follow up appointment.  Her  PHQ screening score today is 14.  We reviewed her QOL forms , discussed the Pulmonary Rehab Program expectations and set some acheivable goals for her.  We will continue to monitor her, support and encourage. She states she does have a pacemaker and atrial fibrillation, her heart rate today is 59 and regular.  She also states she has lots of arthritic issues, hands, knees, back.  Distal pulses faint,  Very mild perip;heral edema.  states she has "prickly feeling" in her feet at night.  We will schedule her return for walk test on 7?10/14 and begin exercise on 05/25/13.  Demonstration and practice of PLB using pulse oximeter.  Patient able to return demonstration satisfactorily. Safety and hand hygiene in the exercise area reviewed with patient.  Patient voices understanding. She was transported to [facility] via  wheelchair post her orientation. Cathie Olden RN

## 2013-05-19 ENCOUNTER — Inpatient Hospital Stay (HOSPITAL_COMMUNITY)
Admission: RE | Admit: 2013-05-19 | Discharge: 2013-05-19 | Disposition: A | Discharge status: Home / Self Care | Payer: Medicare Other | Source: Ambulatory Visit

## 2013-05-20 ENCOUNTER — Encounter (HOSPITAL_COMMUNITY)
Admission: RE | Admit: 2013-05-20 | Discharge: 2013-05-20 | Disposition: A | Discharge status: Home / Self Care | Payer: Medicare Other | Source: Ambulatory Visit | Attending: Internal Medicine | Admitting: Internal Medicine

## 2013-05-21 ENCOUNTER — Other Ambulatory Visit: Payer: Self-pay | Admitting: Cardiology

## 2013-05-24 ENCOUNTER — Ambulatory Visit (INDEPENDENT_AMBULATORY_CARE_PROVIDER_SITE_OTHER): Payer: Medicare Other | Admitting: Neurology

## 2013-05-24 ENCOUNTER — Encounter: Payer: Self-pay | Admitting: Neurology

## 2013-05-24 VITALS — BP 106/53 | HR 61 | Ht 64.0 in | Wt 146.0 lb

## 2013-05-24 DIAGNOSIS — I4891 Unspecified atrial fibrillation: Secondary | ICD-10-CM

## 2013-05-24 DIAGNOSIS — R269 Unspecified abnormalities of gait and mobility: Secondary | ICD-10-CM

## 2013-05-24 DIAGNOSIS — R5383 Other fatigue: Secondary | ICD-10-CM

## 2013-05-24 DIAGNOSIS — Z950 Presence of cardiac pacemaker: Secondary | ICD-10-CM

## 2013-05-24 DIAGNOSIS — I10 Essential (primary) hypertension: Secondary | ICD-10-CM

## 2013-05-24 NOTE — Progress Notes (Signed)
[facility]  PATIENT: [name] [name] DOB: May 16, 1936  HISTORICAL  [name] [name] is a 77 years old right-handed Caucasian female, referred by her primary care physician Dr. Lupe Carney for evaluation of gait difficulty.  She had past medical history of hypertension, diabetes, hyperlipidemia, atrial fibrillation, depression, anxiety, COPD, home oxygen dependent, presenting with gait difficulty since 2012  She fell, landed on her left parietal region, she has transient loss of consciousness, require stitches to her left skull, she had dizziness, was diagnosed with benign positional vertigo, underwent vestibular rehabilitation for 3 weeks, with improvement, but she still has intermittent episodes or when lying on her right side, she felt spinning sensation, with mild hearing loss  She also has unsteady gait, difficulty waking, staggering to the right or left, subjective symmetric weakness of her bilateral lower extremity, no numbness, she has urinary incontinence since her fall in 2012,    REVIEW OF SYSTEMS: Full 14 system review of systems performed and notable only for murmur, trouble swallowing, shortness of breath, cough, incontinence, easy bruising, headache, weakness, difficulty sweating, dizziness, insomnia, depression anxiety  ALLERGIES: Allergies  Allergen Reactions  . Calan (Verapamil Hcl) Other (See Comments)    Patient states it makes her "out of her mind"; bp bottoms out  . Crestor (Rosuvastatin Calcium) Other (See Comments)    Muscle pain  . Escitalopram Oxalate Other (See Comments)    Unknown   . Lipitor (Atorvastatin Calcium) Other (See Comments)    myalgia  . Lopressor (Metoprolol Tartrate) Other (See Comments)    Blood pressure bottoms out   . Norvasc (Amlodipine Besylate) Other (See Comments)    fatigue  . Nsaids   . Prednisone     SWELLING  . Sulfa Antibiotics Nausea Only and Other (See Comments)    Makes her stomach hurt  . Vimovo  (Naproxen-Esomeprazole)   . Penicillins Hives and Rash    HOME MEDICATIONS: Outpatient Prescriptions Prior to Visit  Medication Sig Dispense Refill  . aspirin 81 MG tablet Take 81 mg by mouth daily.      . bisoprolol (ZEBETA) 10 MG tablet Take 10 mg by mouth daily.      . budesonide-formoterol (SYMBICORT) 160-4.5 MCG/ACT inhaler Inhale 2 puffs into the lungs 2 (two) times daily.  1 Inhaler  0  . buPROPion (WELLBUTRIN XL) 300 MG 24 hr tablet Take 300 mg by mouth daily.      . calcium carbonate (TUMS) 500 MG chewable tablet Chew 1 tablet by mouth daily.      [name] conjugated estrogens (PREMARIN) vaginal cream Place 0.625 g vaginally daily as needed. For vaginal dryness      . diltiazem (CARDIZEM CD) 120 MG 24 hr capsule Take 120 mg by mouth 2 (two) times daily.       [name] diltiazem (CARDIZEM CD) 120 MG 24 hr capsule TAKE ONE CAPSULE TWICE A DAY  180 capsule  3  . famotidine (PEPCID) 20 MG tablet Take 20 mg by mouth 2 (two) times daily as needed. For GERD      . fluticasone (FLONASE) 50 MCG/ACT nasal spray Place 2 sprays into the nose daily.  16 g  3  . furosemide (LASIX) 40 MG tablet Take 40 mg by mouth 2 (two) times daily.       [name] gabapentin (NEURONTIN) 300 MG capsule Take 300-600 mg by mouth 4 (four) times daily. Takes 1 tablet in the am, 2 dinner and 2 at bedtime      . HYDROcodone-acetaminophen (  NORCO) 10-325 MG per tablet Take 1 tablet by mouth every 6 (six) hours as needed. For pain      . LORazepam (ATIVAN) 0.5 MG tablet Take 0.5 mg by mouth every 6 (six) hours as needed. For anxiety      . losartan (COZAAR) 100 MG tablet Take 1 tablet (100 mg total) by mouth daily.  30 tablet  3  . metFORMIN (GLUCOPHAGE) 500 MG tablet Take 500 mg by mouth 2 (two) times daily.       . mirtazapine (REMERON) 45 MG tablet Take 45 mg by mouth at bedtime.      Docia Barrier IN Inhale into the lungs as directed.      . potassium chloride SA (K-DUR,KLOR-CON) 20 MEQ tablet Take 20 mEq by mouth daily.       [name] PROAIR  HFA 108 (90 BASE) MCG/ACT inhaler Inhale 2 puffs into the lungs every 6 (six) hours as needed. For shortness of breath/wheezing      . sertraline (ZOLOFT) 100 MG tablet Take 200 mg by mouth daily.       . simvastatin (ZOCOR) 10 MG tablet Take 1 tablet (10 mg total) by mouth at bedtime.  90 tablet  1  . temazepam (RESTORIL) 7.5 MG capsule Take 7.5 mg by mouth at bedtime as needed. Insomnia       No facility-administered medications prior to visit.    PAST MEDICAL HISTORY: Past Medical History  Diagnosis Date  . Hypertrophic cardiomyopathy   . Hypertension   . Osteoarthritis   . Situational mixed anxiety and depressive disorder   . Hypercholesterolemia   . Pacemaker 1998  . Falls frequently     coumadin stopped, now on ASA 81mg   . Pulmonary HTN     has had prior right heart cath in 2010; was felt that most likely due to elevated left sided pressures and would not benefit from vasodilator therapy  . Diabetes mellitus   . Oxygen dependent   . COPD (chronic obstructive pulmonary disease)   . TIA (transient ischemic attack)   . Atrial fibrillation   . CHF (congestive heart failure)     PAST SURGICAL HISTORY: Past Surgical History  Procedure Laterality Date  . Knee arthroplasty  2011    right knee  . Tonsillectomy    . Appendectomy    . Cardiac catheterization    . Insert / replace / remove pacemaker    . Other surgical history      hysterectomy-bening    FAMILY HISTORY: Family History  Problem Relation Age of Onset  . Other Father     died in plane crash  . Cancer Mother     lymphoma-NHL  . Coronary artery disease Grandchild   . Heart disease Maternal Grandmother     SOCIAL HISTORY:  Social History  . Marital Status: Widowed    Spouse Name: N/A    Number of Children: 2  . Years of Education: high school   Occupational History    Retired as Lawyer   Social History Main Topics  . Smoking status: Former Smoker -- 1.00 packs/day for 40 years     Types: Cigarettes    Quit date: 11/15/2005  . Smokeless tobacco: Never Used  . Alcohol Use: No  . Drug Use: No  . Sexually Active: No    Social History Narrative   Former smoker   Orthoptist and part-time since married. Patient is widowed.      Daughter- Salena Saner) Cordelia Pen [phone]  Right handed.  Patient has high school education.         PHYSICAL EXAM    Filed Vitals:   05/24/13 1452  BP: 106/53  Pulse: 61  Height: 5\' 4"  (1.626 m)  Weight: 146 lb (66.225 kg)   Body mass index is 25.05 kg/(m^2).   Generalized: In no acute distress  Neck: Supple, no carotid bruits   Cardiac: Regular rate rhythm  Pulmonary: Clear to auscultation bilaterally  Musculoskeletal: No deformity  Neurological examination  Mentation: Alert oriented to time, place, history taking, and causual conversation  Cranial nerve II-XII: Pupils were equal round reactive to light extraocular movements were full, visual field were full on confrontational test. facial sensation and strength were normal. hearing was intact to finger rubbing bilaterally. Uvula tongue midline.  head turning and shoulder shrug and were normal and symmetric.Tongue protrusion into cheek strength was normal.  Motor: normal tone, bulk and strength.  Sensory: Intact to fine touch, pinprick, preserved vibratory sensation, and proprioception at toes.  Coordination: Normal finger to nose, heel-to-shin bilaterally there was no truncal ataxia  Gait: Rising up from seated position without assistance, wide based, cautious, could not do tiptoe, heels, and tandem walking Romberg signs: Negative  Deep tendon reflexes: Brachioradialis 3/3, biceps 3/3, triceps 3/3, patellar 3/3, Achilles 2/2, plantar responses were flexor bilaterally.   DIAGNOSTIC DATA (LABS, IMAGING, TESTING) - I reviewed patient records, labs, notes, testing and imaging myself where available.  Lab Results  Component Value Date   WBC 9.1 05/07/2013   HGB 13.6  05/07/2013   HCT 38.7 05/07/2013   MCV 97.2 05/07/2013   PLT 265 05/07/2013      Component Value Date/Time   NA 139 05/10/2013 0610   K 3.8 05/10/2013 0610   CL 100 05/10/2013 0610   CO2 30 05/10/2013 0610   GLUCOSE 158* 05/10/2013 0610   BUN 20 05/10/2013 0610   CREATININE 0.89 05/10/2013 0610   CALCIUM 9.8 05/10/2013 0610   PROT 8.1 05/07/2013 2128   ALBUMIN 4.2 05/07/2013 2128   AST 39* 05/07/2013 2128   ALT 11 05/07/2013 2128   ALKPHOS 75 05/07/2013 2128   BILITOT 0.7 05/07/2013 2128   GFRNONAA 62* 05/10/2013 0610   GFRAA 72* 05/10/2013 0610   Lab Results  Component Value Date   CHOL 173 10/14/2012   HDL 39.50 10/14/2012   LDLCALC 109* 10/14/2012   TRIG 122.0 10/14/2012   CHOLHDL 4 10/14/2012   Lab Results  Component Value Date   HGBA1C 6.8* 05/08/2013   No results found for this basename: VITAMINB12   Lab Results  Component Value Date   TSH 2.689 05/08/2013      ASSESSMENT AND PLAN  77 years old right-handed Caucasian female, with complicated past medical history, including COPD, oxygen dependent, atrial fibrillation, on aspirin 81 mg a day, hypertension, hyperlipidemia, presenting with gait difficulty, hyperreflexia on examination,  1 differentiation diagnosis including cervical spondylitic myelopathy, also multifactorial including deconditioning, aging. 2. physical therapy. 3 CAT scan of the cervical spine 4 return to clinic in one month.  Levert Feinstein, M.D. Ph.D.  [facility] [address], [address] [place], [place] [address] [phone]

## 2013-05-25 ENCOUNTER — Encounter (HOSPITAL_COMMUNITY)
Admission: RE | Admit: 2013-05-25 | Discharge: 2013-05-25 | Disposition: A | Discharge status: Home / Self Care | Payer: Medicare Other | Source: Ambulatory Visit | Attending: Internal Medicine | Admitting: Internal Medicine

## 2013-05-25 LAB — GLUCOSE, CAPILLARY
Glucose-Capillary: 284 mg/dL — ABNORMAL HIGH (ref 70–99)
Glucose-Capillary: 207 mg/dL — ABNORMAL HIGH (ref 70–99)

## 2013-05-25 NOTE — Progress Notes (Signed)
Ms. Rinks came today for her first exercise session in Pulmonary Rehab.  She was oriented to equipment use, RPE and Dyspnea Scale, safety and hand hygiene.  She was able to do pre exercise stretches and equipment use without any difficulty.  She did walk on track, using wheel chair for balance and for her portable oxygen.  She did not have any balance issues today.  She did feel a little anxious at beginning of class but was outgoing and talkative with other patients at the completion of class.   We will continue to encourage and support. Cathie Olden RN

## 2013-05-27 ENCOUNTER — Telehealth (HOSPITAL_COMMUNITY): Payer: Self-pay | Admitting: Family Medicine

## 2013-05-27 ENCOUNTER — Encounter (HOSPITAL_COMMUNITY): Payer: Medicare Other

## 2013-05-28 ENCOUNTER — Other Ambulatory Visit: Payer: Medicare Other

## 2013-05-28 ENCOUNTER — Telehealth: Payer: Self-pay | Admitting: Cardiology

## 2013-05-28 NOTE — Telephone Encounter (Signed)
Patient seen at [facility] walk in [facility], xray and labs done. Per patient she was told that she has worsening CHF and they had her take extra Lasix last night. Patient is only taking her Lasix once daily. Advised to get back on her twice a day dose and scheduled ov for next week.

## 2013-05-28 NOTE — Telephone Encounter (Signed)
New Prob  Pt would like to speak with you. She said that she was seen at [facility] clinic.

## 2013-05-31 ENCOUNTER — Ambulatory Visit
Admission: RE | Admit: 2013-05-31 | Discharge: 2013-05-31 | Disposition: A | Discharge status: Home / Self Care | Payer: Medicare Other | Source: Ambulatory Visit | Attending: Neurology | Admitting: Neurology

## 2013-05-31 DIAGNOSIS — I4891 Unspecified atrial fibrillation: Secondary | ICD-10-CM

## 2013-05-31 DIAGNOSIS — I10 Essential (primary) hypertension: Secondary | ICD-10-CM

## 2013-05-31 DIAGNOSIS — Z950 Presence of cardiac pacemaker: Secondary | ICD-10-CM

## 2013-05-31 DIAGNOSIS — R5383 Other fatigue: Secondary | ICD-10-CM

## 2013-05-31 DIAGNOSIS — R269 Unspecified abnormalities of gait and mobility: Secondary | ICD-10-CM

## 2013-05-31 NOTE — Progress Notes (Signed)
Quick Note:  Please call patient, ct cervical showed multilevel degenerative disease, no significant canal or foraminal stenosis     ______

## 2013-06-01 ENCOUNTER — Ambulatory Visit (INDEPENDENT_AMBULATORY_CARE_PROVIDER_SITE_OTHER): Payer: Medicare Other | Admitting: Cardiology

## 2013-06-01 ENCOUNTER — Telehealth (HOSPITAL_COMMUNITY): Payer: Self-pay | Admitting: *Deleted

## 2013-06-01 ENCOUNTER — Encounter: Payer: Self-pay | Admitting: Cardiology

## 2013-06-01 ENCOUNTER — Encounter (HOSPITAL_COMMUNITY): Admission: RE | Admit: 2013-06-01 | Payer: Medicare Other | Source: Ambulatory Visit

## 2013-06-01 VITALS — BP 130/60 | HR 80 | Ht 64.0 in | Wt 146.0 lb

## 2013-06-01 DIAGNOSIS — I4891 Unspecified atrial fibrillation: Secondary | ICD-10-CM

## 2013-06-01 DIAGNOSIS — R0609 Other forms of dyspnea: Secondary | ICD-10-CM

## 2013-06-01 DIAGNOSIS — R0602 Shortness of breath: Secondary | ICD-10-CM

## 2013-06-01 DIAGNOSIS — I422 Other hypertrophic cardiomyopathy: Secondary | ICD-10-CM

## 2013-06-01 NOTE — Progress Notes (Signed)
[name] [name] Date of Birth:  07/06/1936 [facility] [address] [place], [place]  [address] [phone]         Fax   [phone]  History of Present Illness: This pleasant 77 year old woman is seen for a work in office visit. She was recently seen in the emergency room because of worsening dyspnea which responded to increased dose of Lasix and she did not have to be admitted.  Last week she failed to take her Lasix all day and was seen in the walk-in clinic in the evening where she was told that her heart failure is worse. She has a history of chronic atrial fibrillation.  She is not on Coumadin because of frequent falls.  She had another fall last night. She has a pacemaker in place. Her most recent echocardiogram was 03/01/13. The results were as follows:  - Left ventricle: The cavity size was normal. Wall thickness was increased in a pattern of mild LVH. Systolic function was normal. The estimated ejection fraction was in the range of 60% to 65%. Wall motion was normal; there were no regional wall motion abnormalities. The study is not technically sufficient to allow evaluation of LV diastolic function. - Ventricular septum: The contour showed diastolic flattening and systolic flattening. - Mitral valve: Calcified annulus. Mild regurgitation. - Left atrium: The atrium was moderately dilated. - Right atrium: The atrium was moderately dilated. - Tricuspid valve: Moderate regurgitation. - Pulmonary arteries: Systolic pressure was severely increased. PA peak pressure: 82mm Hg (S). - Pericardium, extracardiac: A trivial pericardial effusion was identified.  She has a history of pulmonary hypertension and her chest x-ray shows pulmonary fibrosis .She has a history of poor balance weakness and frequent falls. She previously was on Coumadin which had to be stopped because of her fall risk. She now takes a daily 81 mg aspirin. The patient has also had problems with  urinary frequency. She has difficulty taking the amount of Lasix that she needs to take for her dyspnea and pulmonary hypertension. She continues to have problems with easy fatigue. She has symptoms of depression and is on antidepressants. She's had problems with poor balance related to her diabetes mellitus.  She has chronic back pain and is followed by Dr. Vear Clock at the pain center    Current Outpatient Prescriptions  Medication Sig Dispense Refill  . aspirin 81 MG tablet Take 81 mg by mouth daily.      . bisoprolol (ZEBETA) 10 MG tablet Take 10 mg by mouth daily.      . budesonide-formoterol (SYMBICORT) 160-4.5 MCG/ACT inhaler Inhale 2 puffs into the lungs 2 (two) times daily.  1 Inhaler  0  . buPROPion (WELLBUTRIN XL) 300 MG 24 hr tablet Take 300 mg by mouth daily.      . calcium carbonate (TUMS) 500 MG chewable tablet Chew 1 tablet by mouth daily.      [name] conjugated estrogens (PREMARIN) vaginal cream Place 0.625 g vaginally daily as needed. For vaginal dryness      . diltiazem (CARDIZEM CD) 120 MG 24 hr capsule Take 120 mg by mouth 2 (two) times daily.       . famotidine (PEPCID) 20 MG tablet Take 20 mg by mouth 2 (two) times daily as needed. For GERD      . fluticasone (FLONASE) 50 MCG/ACT nasal spray Place 2 sprays into the nose daily.  16 g  3  . furosemide (LASIX) 40 MG tablet Take 40 mg by mouth 2 (  two) times daily.       [name] gabapentin (NEURONTIN) 300 MG capsule Take 300-600 mg by mouth 4 (four) times daily. Takes 1 tablet in the am, 2 dinner and 2 at bedtime      . HYDROcodone-acetaminophen (NORCO) 10-325 MG per tablet Take 1 tablet by mouth every 6 (six) hours as needed. For pain      . LORazepam (ATIVAN) 0.5 MG tablet Take 0.5 mg by mouth every 6 (six) hours as needed. For anxiety      . losartan (COZAAR) 100 MG tablet Take 1 tablet (100 mg total) by mouth daily.  30 tablet  3  . metFORMIN (GLUCOPHAGE) 500 MG tablet Take 500 mg by mouth 2 (two) times daily.       . mirtazapine  (REMERON) 45 MG tablet Take 45 mg by mouth at bedtime.      Docia Barrier IN Inhale into the lungs as directed.      . potassium chloride SA (K-DUR,KLOR-CON) 20 MEQ tablet Take 20 mEq by mouth daily.       [name] PROAIR HFA 108 (90 BASE) MCG/ACT inhaler Inhale 2 puffs into the lungs every 6 (six) hours as needed. For shortness of breath/wheezing      . sertraline (ZOLOFT) 100 MG tablet Take 200 mg by mouth daily.       . simvastatin (ZOCOR) 10 MG tablet Take 1 tablet (10 mg total) by mouth at bedtime.  90 tablet  1  . temazepam (RESTORIL) 7.5 MG capsule Take 7.5 mg by mouth at bedtime as needed. Insomnia       No current facility-administered medications for this visit.    Allergies  Allergen Reactions  . Calan (Verapamil Hcl) Other (See Comments)    Patient states it makes her "out of her mind"; bp bottoms out  . Crestor (Rosuvastatin Calcium) Other (See Comments)    Muscle pain  . Escitalopram Oxalate Other (See Comments)    Unknown   . Lipitor (Atorvastatin Calcium) Other (See Comments)    myalgia  . Lopressor (Metoprolol Tartrate) Other (See Comments)    Blood pressure bottoms out   . Norvasc (Amlodipine Besylate) Other (See Comments)    fatigue  . Nsaids   . Prednisone     SWELLING  . Sulfa Antibiotics Nausea Only and Other (See Comments)    Makes her stomach hurt  . Vimovo (Naproxen-Esomeprazole)   . Penicillins Hives and Rash    Patient Active Problem List   Diagnosis Date Noted  . Abnormality of gait 05/24/2013  . Acute respiratory distress 05/08/2013  . Pulmonary edema, acute 05/08/2013  . Hypertension   . Pulmonary HTN   . Oxygen dependent   . COPD (chronic obstructive pulmonary disease)   . Fatigue due to cardiopulmonary and deconditioning 04/23/2013  . Memory deficit 02/07/2013  . Memory change 12/11/2012  . TIA (transient ischemic attack) 02/19/2012  . Chest pain at rest 08/05/2011  . Type II or unspecified type diabetes mellitus without mention of  complication, uncontrolled 06/12/2011  . Fall 03/22/2011  . Mitral regurgitation 03/13/2011  . Hypertrophic cardiomyopathy   . Osteoarthritis   . Situational mixed anxiety and depressive disorder   . Hypercholesterolemia   . Pacemaker 02/26/2011  . Atrial fibrillation 01/28/2011  . CHRONIC OBSTRUCTIVE PULMONARY DISEASE, MODERATE 05/23/2010  . DEPRESSIVE DISORDER NOT ELSEWHERE CLASSIFIED 04/11/2010  . HYPERLIPIDEMIA 05/31/2009  . HYPERTENSION 05/31/2009  . ALLERGIC RHINITIS 05/31/2009  . SHORTNESS OF BREATH 05/31/2009  . Cough 05/31/2009  History  Smoking status  . Former Smoker -- 1.00 packs/day for 40 years  . Types: Cigarettes  . Quit date: 11/15/2005  Smokeless tobacco  . Never Used    History  Alcohol Use No    Family History  Problem Relation Age of Onset  . Other Father     died in plane crash  . Cancer Mother     lymphoma-NHL  . Coronary artery disease Grandchild   . Heart disease Maternal Grandmother     Review of Systems: Constitutional: no fever chills diaphoresis or fatigue or change in weight.  Head and neck: no hearing loss, no epistaxis, no photophobia or visual disturbance. Respiratory: No cough, shortness of breath or wheezing. Cardiovascular: No chest pain peripheral edema, palpitations. Gastrointestinal: No abdominal distention, no abdominal pain, no change in bowel habits hematochezia or melena. Genitourinary: No dysuria, no frequency, no urgency, no nocturia. Musculoskeletal:No arthralgias, no back pain, no gait disturbance or myalgias. Neurological: No dizziness, no headaches, no numbness, no seizures, no syncope, no weakness, no tremors. Hematologic: No lymphadenopathy, no easy bruising. Psychiatric: No confusion, no hallucinations, no sleep disturbance.    Physical Exam: Filed Vitals:   06/01/13 0901  BP: 130/60  Pulse: 80   the general appearance reveals a chronically ill elderly woman in no distress.  Continuous nasal oxygen in  place.The head and neck exam reveals pupils equal and reactive.  Extraocular movements are full.  There is no scleral icterus.  The mouth and pharynx are normal.  The neck is supple.  The carotids reveal no bruits.  The jugular venous pressure is normal.  The  thyroid is not enlarged.  There is no lymphadenopathy.  The chest is clear to percussion and auscultation.  There are no rales or rhonchi.  Expansion of the chest is symmetrical.  The precordium is quiet.  The first heart sound is normal.  The second heart sound is physiologically split.  There is soft systolic murmur at the base  There is no abnormal lift or heave.  The abdomen is soft and nontender.  The bowel sounds are normal.  The liver and spleen are not enlarged.  There are no abdominal masses.  There are no abdominal bruits.  Extremities reveal good pedal pulses.  There is no phlebitis or edema.  There is no cyanosis or clubbing.  Strength is normal and symmetrical in all extremities.  There is no lateralizing weakness.  There are no sensory deficits.  The skin is warm and dry.  There is no rash.     Assessment / Plan: Presently she is euvolemic.  She should continue Lasix 40 mg twice a day every day.  She will keep her regular previously scheduled office visit in October for office visit and EKG.  She has a mild occipital headache following her fall last evening and if this persists she will contact her PCP Dr. Lupe Carney

## 2013-06-01 NOTE — Assessment & Plan Note (Signed)
The patient has not been experiencing any chest pain from her cardiomyopathy

## 2013-06-01 NOTE — Patient Instructions (Signed)
CONTINUE THE FUROSEMIDE (LASIX) 40 MG TWICE A DAY  You should get a letter soon for your October appointment  If you continue to have headaches follow up with Dr Clovis Riley

## 2013-06-01 NOTE — Assessment & Plan Note (Signed)
She remains in atrial fibrillation with controlled ventricular response.  She has a functioning pacemaker.  No dizziness or syncope.  Her falls are not preceded by dizziness

## 2013-06-01 NOTE — Progress Notes (Signed)
Quick Note:  I called pt and relayed the results of CT cervical. (age related changes, arthritis). She is taking medication from her pain management MD that helps her. ______

## 2013-06-01 NOTE — Assessment & Plan Note (Signed)
After not having taken her Lasix last week she has been careful to take it twice a day over the weekend and today she is improved.  Her weight is unchanged from prior visit.  She does not have any peripheral edema today.

## 2013-06-03 ENCOUNTER — Encounter (HOSPITAL_COMMUNITY): Payer: Medicare Other

## 2013-06-03 ENCOUNTER — Telehealth (HOSPITAL_COMMUNITY): Payer: Self-pay | Admitting: Family Medicine

## 2013-06-08 ENCOUNTER — Encounter (HOSPITAL_COMMUNITY)
Admission: RE | Admit: 2013-06-08 | Discharge: 2013-06-08 | Disposition: A | Discharge status: Home / Self Care | Payer: Medicare Other | Source: Ambulatory Visit | Attending: Internal Medicine | Admitting: Internal Medicine

## 2013-06-10 ENCOUNTER — Encounter (HOSPITAL_COMMUNITY)
Admission: RE | Admit: 2013-06-10 | Discharge: 2013-06-10 | Disposition: A | Discharge status: Home / Self Care | Payer: Medicare Other | Source: Ambulatory Visit | Attending: Internal Medicine | Admitting: Internal Medicine

## 2013-06-15 ENCOUNTER — Encounter (HOSPITAL_COMMUNITY)
Admission: RE | Admit: 2013-06-15 | Discharge: 2013-06-15 | Disposition: A | Discharge status: Home / Self Care | Payer: Medicare Other | Source: Ambulatory Visit | Attending: Internal Medicine | Admitting: Internal Medicine

## 2013-06-15 DIAGNOSIS — I5032 Chronic diastolic (congestive) heart failure: Secondary | ICD-10-CM | POA: Insufficient documentation

## 2013-06-15 DIAGNOSIS — Z9981 Dependence on supplemental oxygen: Secondary | ICD-10-CM | POA: Insufficient documentation

## 2013-06-15 DIAGNOSIS — J9610 Chronic respiratory failure, unspecified whether with hypoxia or hypercapnia: Secondary | ICD-10-CM | POA: Insufficient documentation

## 2013-06-15 DIAGNOSIS — Z5189 Encounter for other specified aftercare: Secondary | ICD-10-CM | POA: Insufficient documentation

## 2013-06-15 DIAGNOSIS — J441 Chronic obstructive pulmonary disease with (acute) exacerbation: Secondary | ICD-10-CM | POA: Insufficient documentation

## 2013-06-15 DIAGNOSIS — E785 Hyperlipidemia, unspecified: Secondary | ICD-10-CM | POA: Insufficient documentation

## 2013-06-15 DIAGNOSIS — I509 Heart failure, unspecified: Secondary | ICD-10-CM | POA: Insufficient documentation

## 2013-06-15 DIAGNOSIS — I2789 Other specified pulmonary heart diseases: Secondary | ICD-10-CM | POA: Insufficient documentation

## 2013-06-16 ENCOUNTER — Encounter: Payer: Self-pay | Admitting: Cardiology

## 2013-06-16 ENCOUNTER — Ambulatory Visit (INDEPENDENT_AMBULATORY_CARE_PROVIDER_SITE_OTHER): Payer: Medicare Other | Admitting: Cardiology

## 2013-06-16 VITALS — BP 138/76 | HR 72 | Ht 64.0 in | Wt 146.8 lb

## 2013-06-16 DIAGNOSIS — G459 Transient cerebral ischemic attack, unspecified: Secondary | ICD-10-CM

## 2013-06-16 DIAGNOSIS — I4891 Unspecified atrial fibrillation: Secondary | ICD-10-CM

## 2013-06-16 DIAGNOSIS — I10 Essential (primary) hypertension: Secondary | ICD-10-CM

## 2013-06-16 MED ORDER — FUROSEMIDE 40 MG PO TABS: 40.0000 mg | ORAL_TABLET | Freq: Every day | ORAL | Status: DC

## 2013-06-16 MED ORDER — LOSARTAN POTASSIUM 50 MG PO TABS: 50.0000 mg | ORAL_TABLET | Freq: Every day | ORAL | Status: DC

## 2013-06-16 NOTE — Assessment & Plan Note (Signed)
The patient has had no further TIA symptoms. 

## 2013-06-16 NOTE — Assessment & Plan Note (Signed)
The patient has had a history of essential hypertension.  Recently her blood pressures have been running low.  She has been participating in the pulmonary rehabilitation program and her nurses have noticed her low blood pressure.  Because of her low blood pressure we will decrease her Lasix back to just 40 mg daily and reduce her losartan to 50 mg.  If she starts to have swelling or increased weight gain we will have to increase her Lasix back to twice a day

## 2013-06-16 NOTE — Progress Notes (Signed)
[name] [name] Date of Birth:  10/27/1936 [facility] [address] [place], [place]  [address] [phone]         Fax   [phone]  History of Present Illness: This pleasant 77 year old Caucasian female is seen as a work in office visit.  She has not been feeling well.  She has a history of multiple falls.  She is no longer on Coumadin for her atrial fibrillation because of her multiple falls.  Last evening at about 8 PM she was carrying out some garbage to her back yard and fell.  She was unable to get up.  She had to crawl back to the house and it took her about 2 hours.  Her life alert did not work in her backyard when she tried to use it.  She did not call 911 when she got back in the house. She has a history of chronic atrial fibrillation.  She has a pacemaker in place.  She has known LVH.  Her last echocardiogram was 03/01/13 and showed severe pulmonary hypertension with PA pressure 82 mmHg.  Her left ventricular ejection fraction was 60-65% and there was mild LVH.  She has a past history of hypertrophic cardiomyopathy although this was not demonstrated on her most recent echo.  Current Outpatient Prescriptions  Medication Sig Dispense Refill  . aspirin 81 MG tablet Take 81 mg by mouth daily.      . bisoprolol (ZEBETA) 10 MG tablet Take 10 mg by mouth daily.      . budesonide-formoterol (SYMBICORT) 160-4.5 MCG/ACT inhaler Inhale 2 puffs into the lungs 2 (two) times daily.  1 Inhaler  0  . buPROPion (WELLBUTRIN XL) 300 MG 24 hr tablet Take 300 mg by mouth daily.      . calcium carbonate (TUMS) 500 MG chewable tablet Chew 1 tablet by mouth daily.      [name] conjugated estrogens (PREMARIN) vaginal cream Place 0.625 g vaginally daily as needed. For vaginal dryness      . diltiazem (CARDIZEM CD) 120 MG 24 hr capsule Take 120 mg by mouth 2 (two) times daily.       . famotidine (PEPCID) 20 MG tablet Take 20 mg by mouth 2 (two) times daily as needed. For GERD      . fluticasone  (FLONASE) 50 MCG/ACT nasal spray Place 2 sprays into the nose daily.  16 g  3  . furosemide (LASIX) 40 MG tablet Take 1 tablet (40 mg total) by mouth daily.  30 tablet  5  . gabapentin (NEURONTIN) 300 MG capsule Take 300-600 mg by mouth 4 (four) times daily. Takes 1 tablet in the am, 2 dinner and 2 at bedtime      . HYDROcodone-acetaminophen (NORCO) 10-325 MG per tablet Take 1 tablet by mouth every 6 (six) hours as needed. For pain      . LORazepam (ATIVAN) 0.5 MG tablet Take 0.5 mg by mouth every 6 (six) hours as needed. For anxiety      . losartan (COZAAR) 50 MG tablet Take 1 tablet (50 mg total) by mouth daily.  30 tablet  3  . metFORMIN (GLUCOPHAGE) 500 MG tablet Take 500 mg by mouth 2 (two) times daily.       . mirtazapine (REMERON) 45 MG tablet Take 45 mg by mouth at bedtime.      Docia Barrier IN Inhale into the lungs as directed.      . potassium chloride SA (K-DUR,KLOR-CON) 20 MEQ tablet Take  20 mEq by mouth daily.       [name] PROAIR HFA 108 (90 BASE) MCG/ACT inhaler Inhale 2 puffs into the lungs every 6 (six) hours as needed. For shortness of breath/wheezing      . sertraline (ZOLOFT) 100 MG tablet Take 200 mg by mouth daily.       . simvastatin (ZOCOR) 10 MG tablet Take 1 tablet (10 mg total) by mouth at bedtime.  90 tablet  1  . temazepam (RESTORIL) 7.5 MG capsule Take 7.5 mg by mouth at bedtime as needed. Insomnia       No current facility-administered medications for this visit.    Allergies  Allergen Reactions  . Calan (Verapamil Hcl) Other (See Comments)    Patient states it makes her "out of her mind"; bp bottoms out  . Crestor (Rosuvastatin Calcium) Other (See Comments)    Muscle pain  . Escitalopram Oxalate Other (See Comments)    Unknown   . Lipitor (Atorvastatin Calcium) Other (See Comments)    myalgia  . Lopressor (Metoprolol Tartrate) Other (See Comments)    Blood pressure bottoms out   . Norvasc (Amlodipine Besylate) Other (See Comments)    fatigue  . Nsaids     . Prednisone     SWELLING  . Sulfa Antibiotics Nausea Only and Other (See Comments)    Makes her stomach hurt  . Vimovo (Naproxen-Esomeprazole)   . Penicillins Hives and Rash    Patient Active Problem List   Diagnosis Date Noted  . Abnormality of gait 05/24/2013  . Acute respiratory distress 05/08/2013  . Pulmonary edema, acute 05/08/2013  . Hypertension   . Pulmonary HTN   . Oxygen dependent   . COPD (chronic obstructive pulmonary disease)   . Fatigue due to cardiopulmonary and deconditioning 04/23/2013  . Memory deficit 02/07/2013  . Memory change 12/11/2012  . TIA (transient ischemic attack) 02/19/2012  . Chest pain at rest 08/05/2011  . Type II or unspecified type diabetes mellitus without mention of complication, uncontrolled 06/12/2011  . Fall 03/22/2011  . Mitral regurgitation 03/13/2011  . Hypertrophic cardiomyopathy   . Osteoarthritis   . Situational mixed anxiety and depressive disorder   . Hypercholesterolemia   . Pacemaker 02/26/2011  . Atrial fibrillation 01/28/2011  . CHRONIC OBSTRUCTIVE PULMONARY DISEASE, MODERATE 05/23/2010  . DEPRESSIVE DISORDER NOT ELSEWHERE CLASSIFIED 04/11/2010  . HYPERLIPIDEMIA 05/31/2009  . HYPERTENSION 05/31/2009  . ALLERGIC RHINITIS 05/31/2009  . SHORTNESS OF BREATH 05/31/2009  . Cough 05/31/2009    History  Smoking status  . Former Smoker -- 1.00 packs/day for 40 years  . Types: Cigarettes  . Quit date: 11/15/2005  Smokeless tobacco  . Never Used    History  Alcohol Use No    Family History  Problem Relation Age of Onset  . Other Father     died in plane crash  . Cancer Mother     lymphoma-NHL  . Coronary artery disease Grandchild   . Heart disease Maternal Grandmother     Review of Systems: Constitutional: no fever chills diaphoresis or fatigue or change in weight.  Head and neck: no hearing loss, no epistaxis, no photophobia or visual disturbance. Respiratory: No cough, shortness of breath or  wheezing. Cardiovascular: No chest pain peripheral edema, palpitations. Gastrointestinal: No abdominal distention, no abdominal pain, no change in bowel habits hematochezia or melena. Genitourinary: No dysuria, no frequency, no urgency, no nocturia. Musculoskeletal:No arthralgias, no back pain, no gait disturbance or myalgias. Neurological: No dizziness, no headaches, no numbness,  no seizures, no syncope, no weakness, no tremors. Hematologic: No lymphadenopathy, no easy bruising. Psychiatric: No confusion, no hallucinations, no sleep disturbance.    Physical Exam: Filed Vitals:   06/16/13 1640  BP: 138/76  Pulse: 72   the general appearance reveals a chronically ill-appearing woman with nasal oxygen in place.  She is chronically pale.The head and neck exam reveals pupils equal and reactive.  Extraocular movements are full.  There is no scleral icterus.  The mouth and pharynx are normal.  The neck is supple.  The carotids reveal no bruits.  The jugular venous pressure is normal.  The  thyroid is not enlarged.  There is no lymphadenopathy.   Lungs reveal mild rhonchi. Expansion of the chest is symmetrical.  The precordium is quiet.  The pulse is regular and paced.  The first heart sound is normal.  The second heart sound is physiologically split.  There is no gallop rub or click.  There is a soft systolic murmur at the left sternal  There is no abnormal lift or heave.  The abdomen is soft and nontender.  The bowel sounds are normal.  The liver and spleen are not enlarged.  There are no abdominal masses.  There are no abdominal bruits.  Extremities reveal good pedal pulses.  There is no phlebitis or edema.  There is no cyanosis or clubbing.  Strength is normal and symmetrical in all extremities.  There is no lateralizing weakness.  There are no sensory deficits.  The skin is warm and dry.  There is no rash.    Assessment / Plan: Continue same medication except reduce Lasix to 40 mg daily and reduce  losartan to just 50 mg daily because of intermittent low blood pressure.  Recheck in October for a followup visit

## 2013-06-16 NOTE — Patient Instructions (Addendum)
Your physician has recommended you make the following change in your medication:   REDUCE LASIX TO 40 MG ONCE A DAY REDUCE LOSARTAN TO 50 MG ONCE A DAY// USE YOUR 100 MG TABLETS BY BREAKING THEM IN HALF AND TAKING 1/2 DAILY, ONCE YOUR BOTTLE IS GONE YOU HAVE A NEW STRENGTH OF 50 MG AT THE PHARMACY AND WILL GO BACK TO ONE TABLET DAILY.

## 2013-06-16 NOTE — Assessment & Plan Note (Signed)
The patient has not had any TIA symptoms from her atrial fluid.  She is not on Coumadin because of multiple falls

## 2013-06-17 ENCOUNTER — Encounter (HOSPITAL_COMMUNITY): Admission: RE | Admit: 2013-06-17 | Payer: Medicare Other | Source: Ambulatory Visit

## 2013-06-19 ENCOUNTER — Other Ambulatory Visit: Payer: Self-pay | Admitting: Internal Medicine

## 2013-06-19 ENCOUNTER — Other Ambulatory Visit: Payer: Self-pay | Admitting: Cardiology

## 2013-06-19 IMAGING — CT CT HEAD W/O CM
2 series · 17 of 30 positions shown, 20 images · non-contrast
Comparison: 06/22/2011

CLINICAL DATA: Fall.  Headache.  History of hypertension and
diabetes.

CT HEAD WITHOUT CONTRAST
TECHNIQUE: Contiguous axial images were obtained from the base of
the skull through the vertex without contrast.

[Series 2: head w/o · axial · non-contrast · 0.43mm/px · z∈[+1324,+1439]mm · 9 of 29 slices shown, 12 images]
[im 3/29  brain]
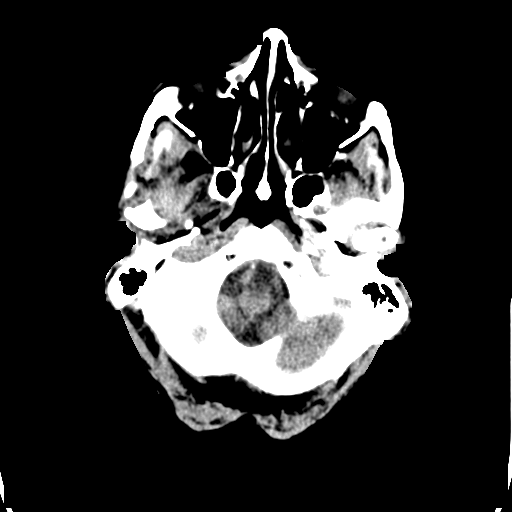
[im 3/29  bone]
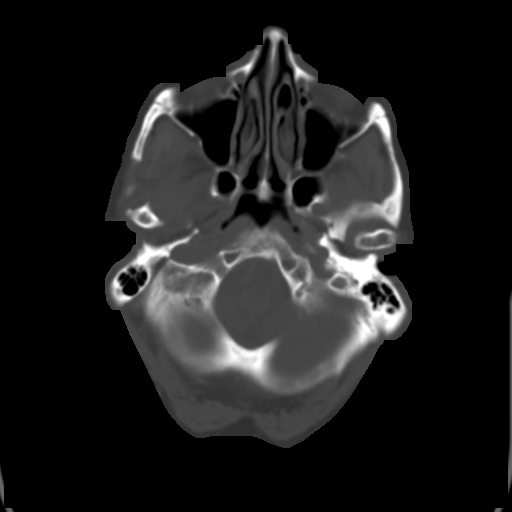
[im 6/29  brain]
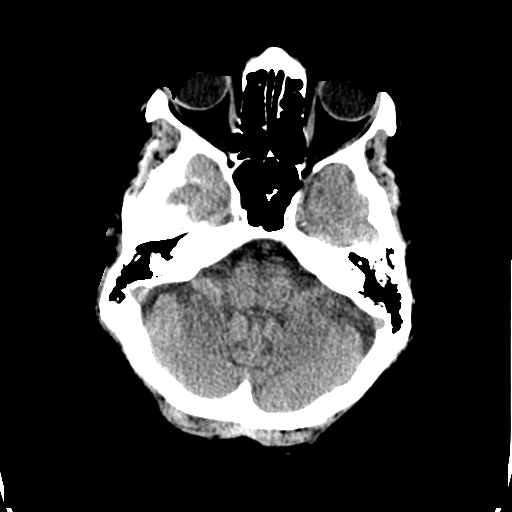
[im 9/29  brain]
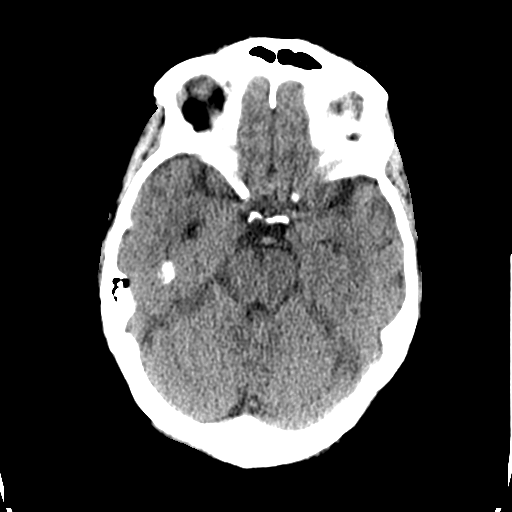
[im 12/29  brain]
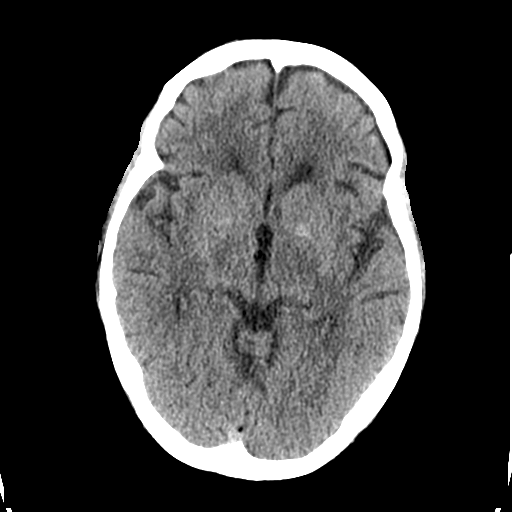
[im 15/29  brain]
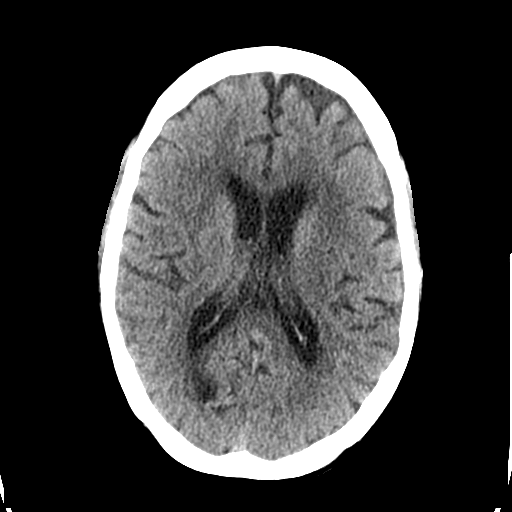
[im 15/29  bone]
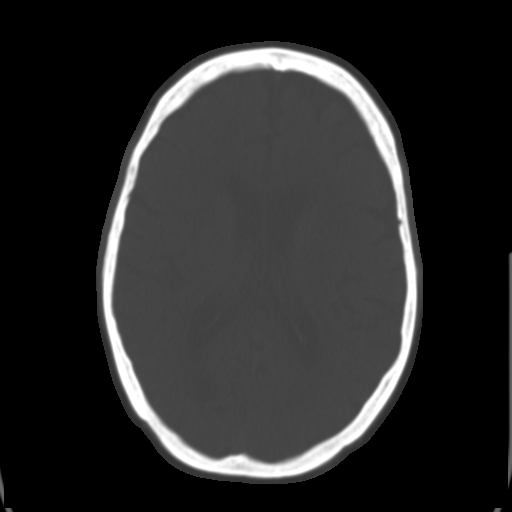
[im 17/29  brain]
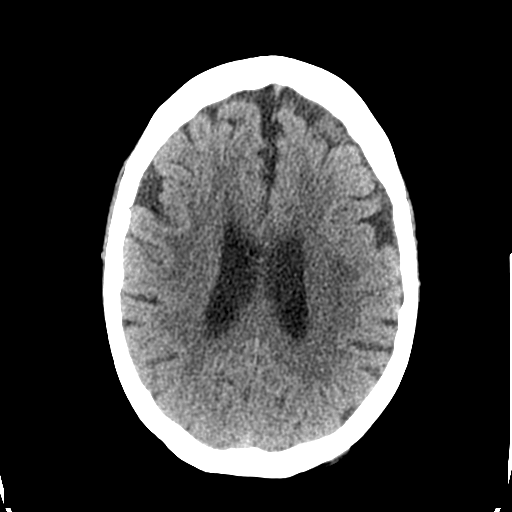
[im 20/29  brain]
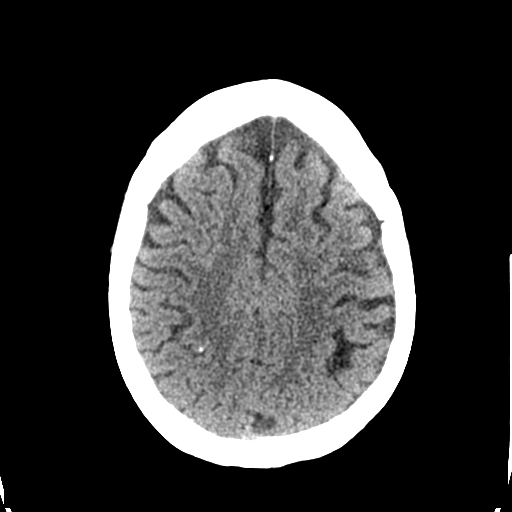
[im 23/29  brain]
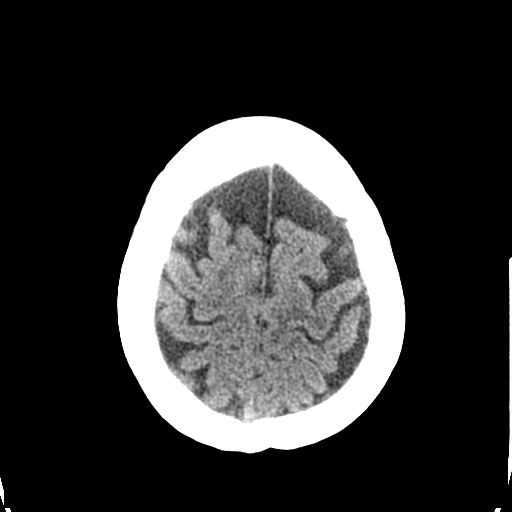
[im 26/29  brain]
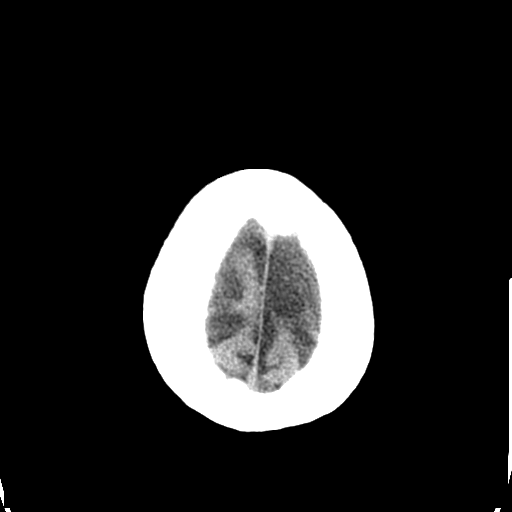
[im 26/29  bone]
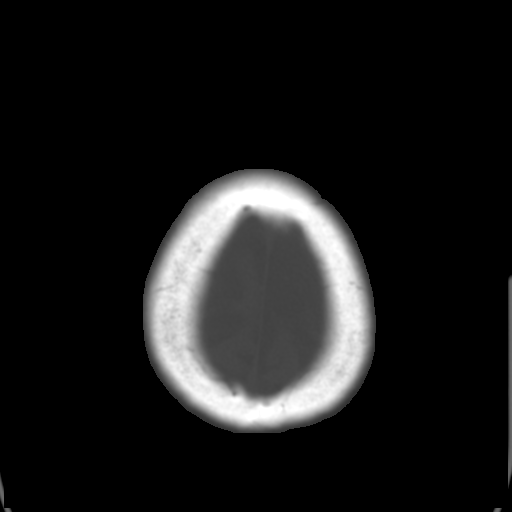

[Series 3: bone windows · axial · 0.43mm/px · z∈[+1329,+1437]mm · 8 of 48 slices shown]
[im 6/48  bone]
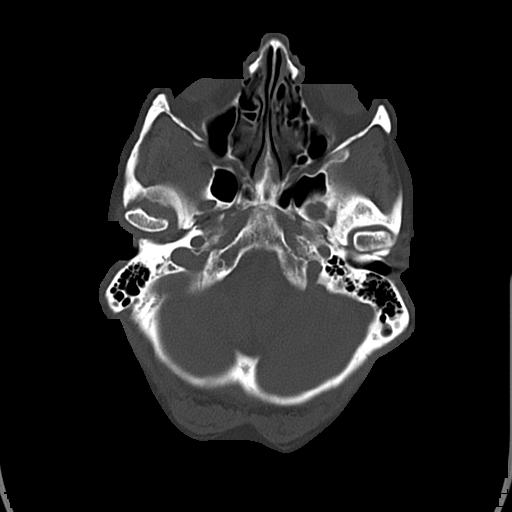
[im 11/48  bone]
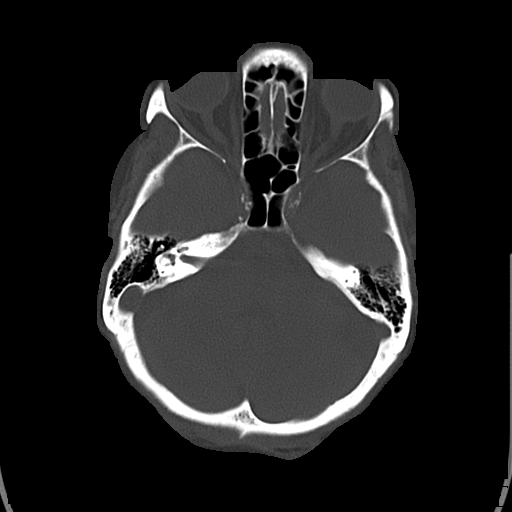
[im 16/48  bone]
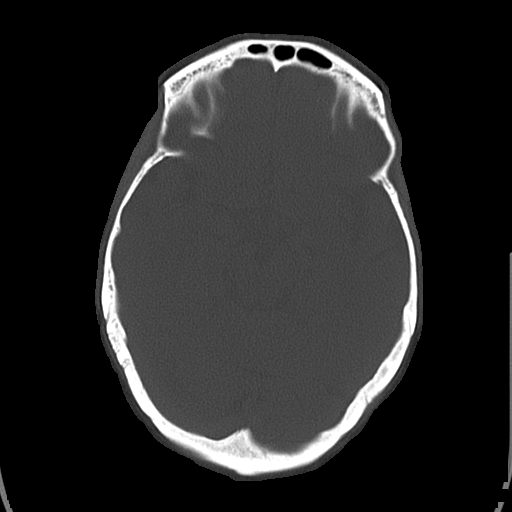
[im 21/48  bone]
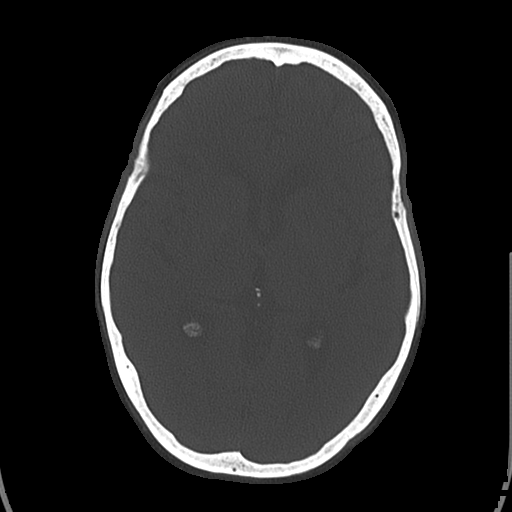
[im 27/48  bone]
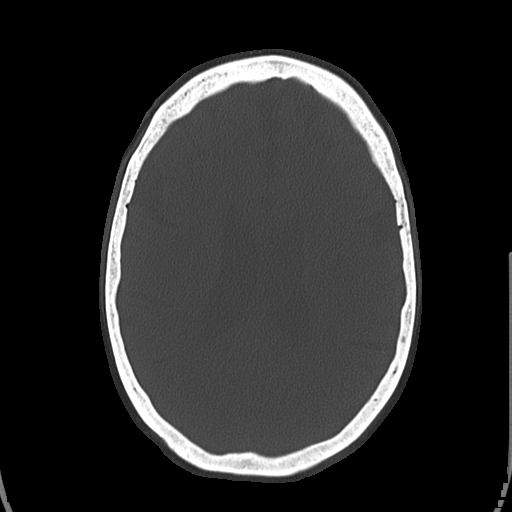
[im 32/48  bone]
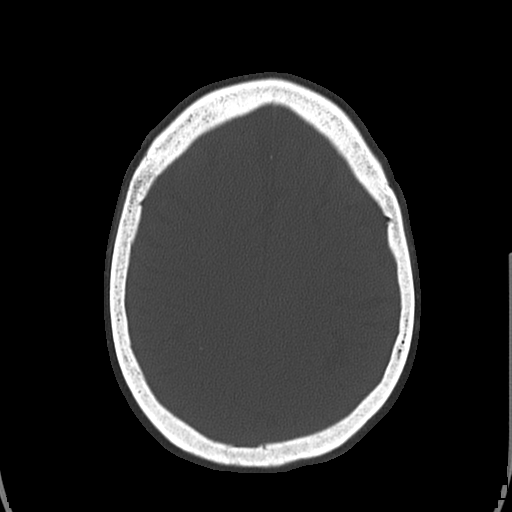
[im 37/48  bone]
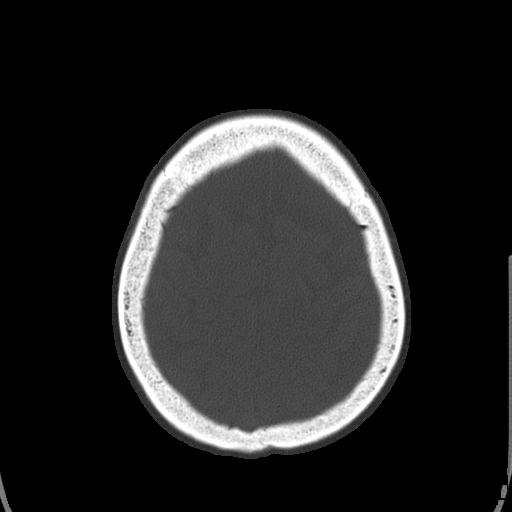
[im 42/48  bone]
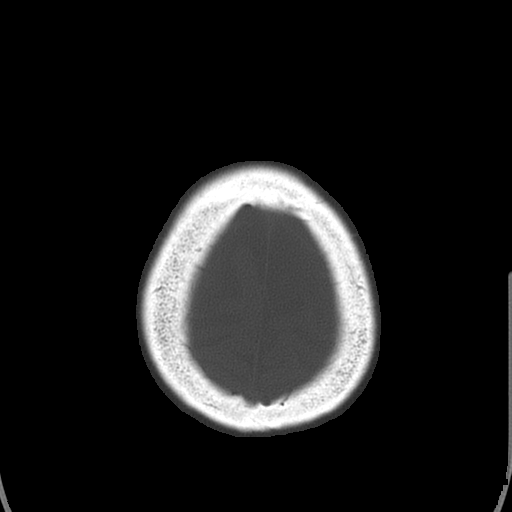

[17 of 30 positions shown; findings below may reference images not displayed]

FINDINGS: There is central and cortical atrophy.  Periventricular
white matter changes are consistent with small vessel disease.
There are bilateral basal ganglia calcifications. There is no
evidence for hemorrhage, mass lesion, or acute infarction.  There
is atherosclerotic calcification of the internal carotid arteries.
Bone windows are otherwise unremarkable.  No calvarial fracture.
IMPRESSION: 1. No evidence for acute intracranial abnormality.
2.  Atrophy and small vessel disease.

## 2013-06-19 IMAGING — CR DG KNEE COMPLETE 4+V*R*
4 series · 4 of 4 positions shown · non-contrast
Comparison: 06/27/2010

CLINICAL DATA: Fell.  Bilateral knee pain.

RIGHT KNEE - COMPLETE 4+ VIEW

[t knee ap right]
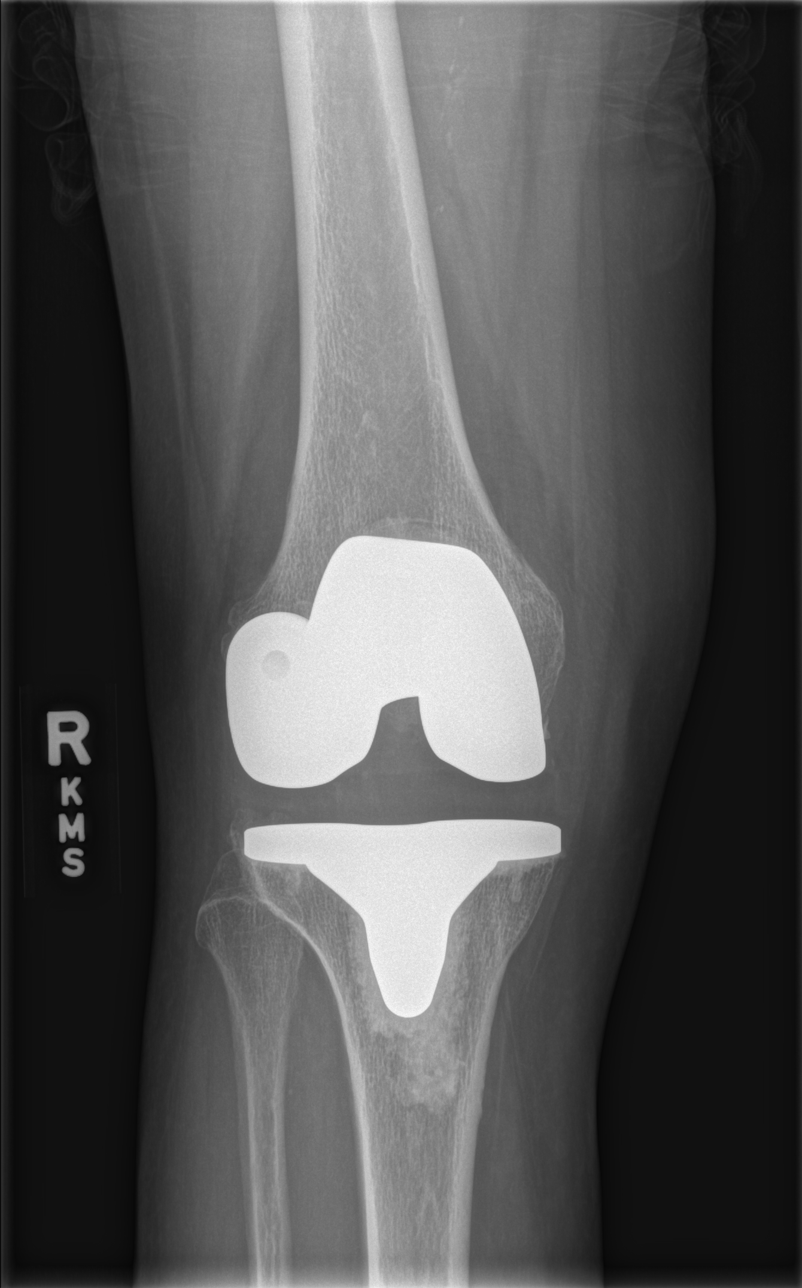

[t knee obl right (1 of 2)]
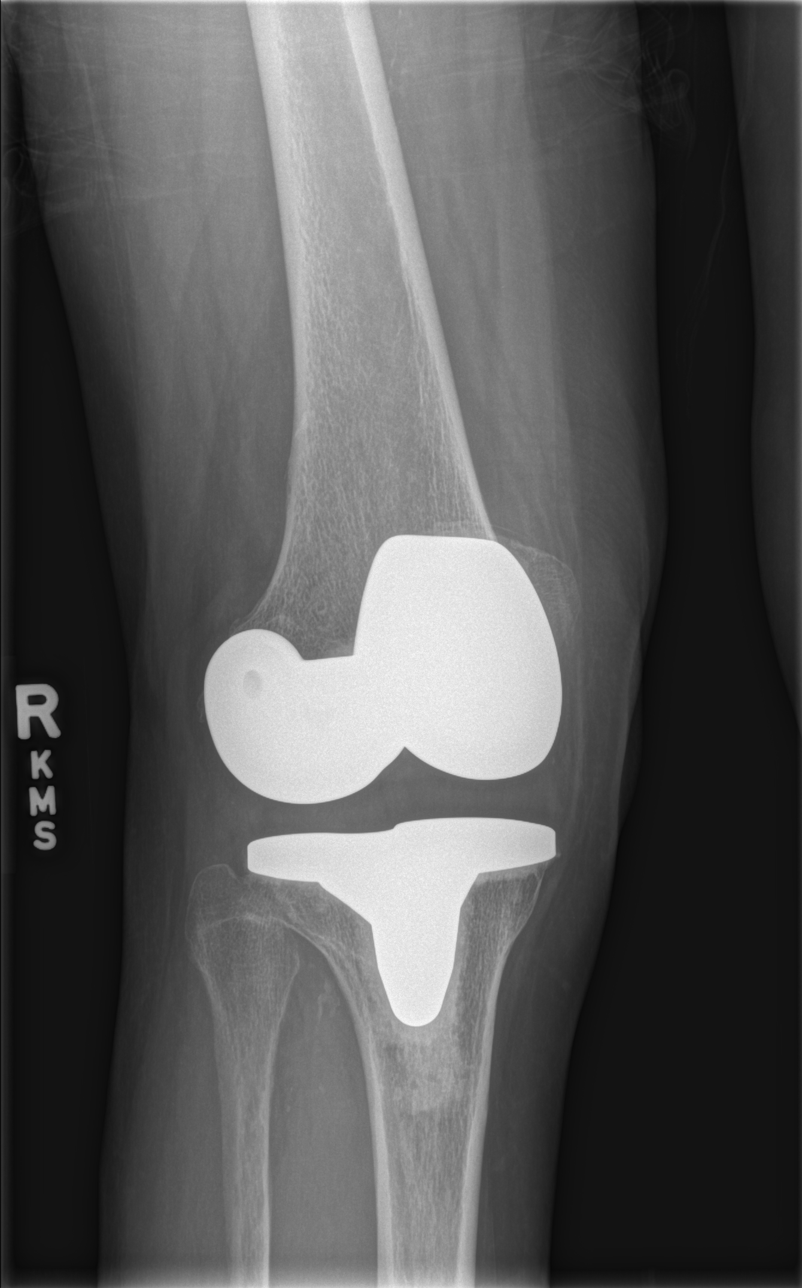

[t knee obl right (2 of 2)]
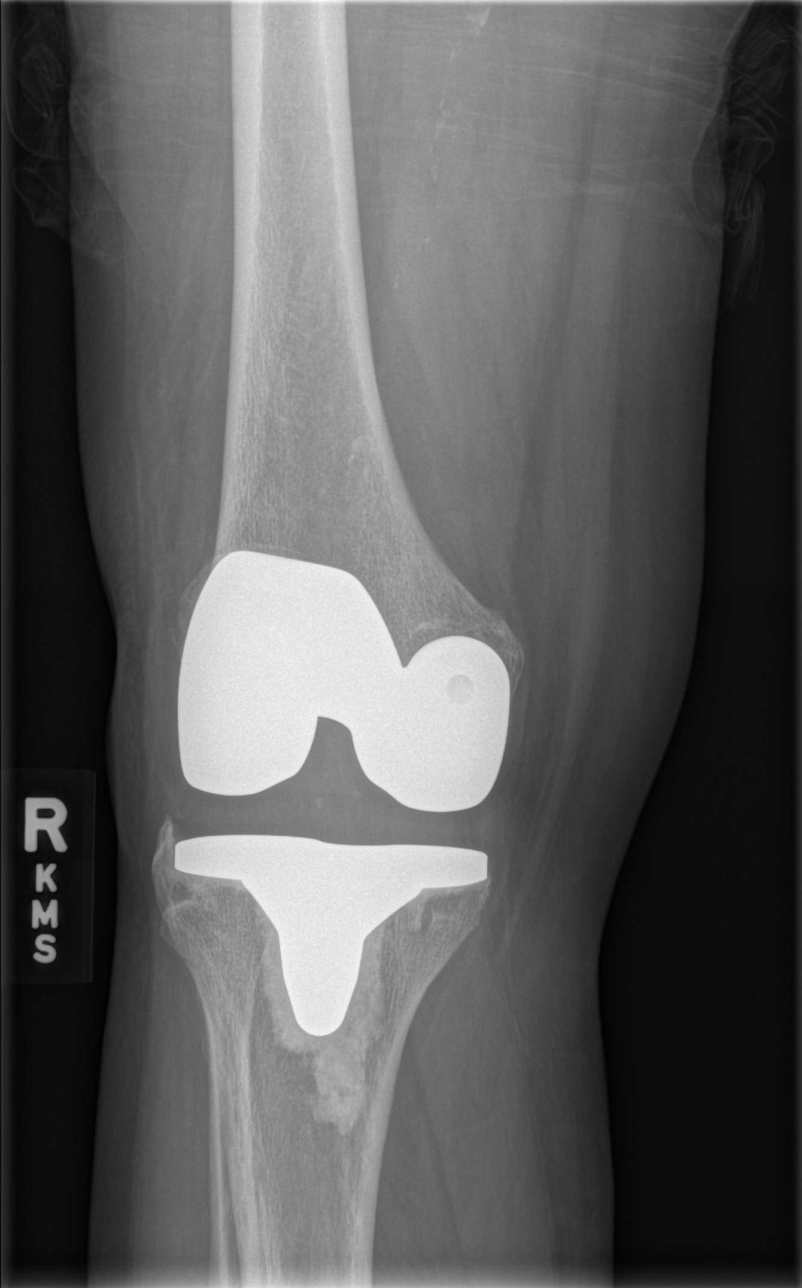

[t knee lat right]
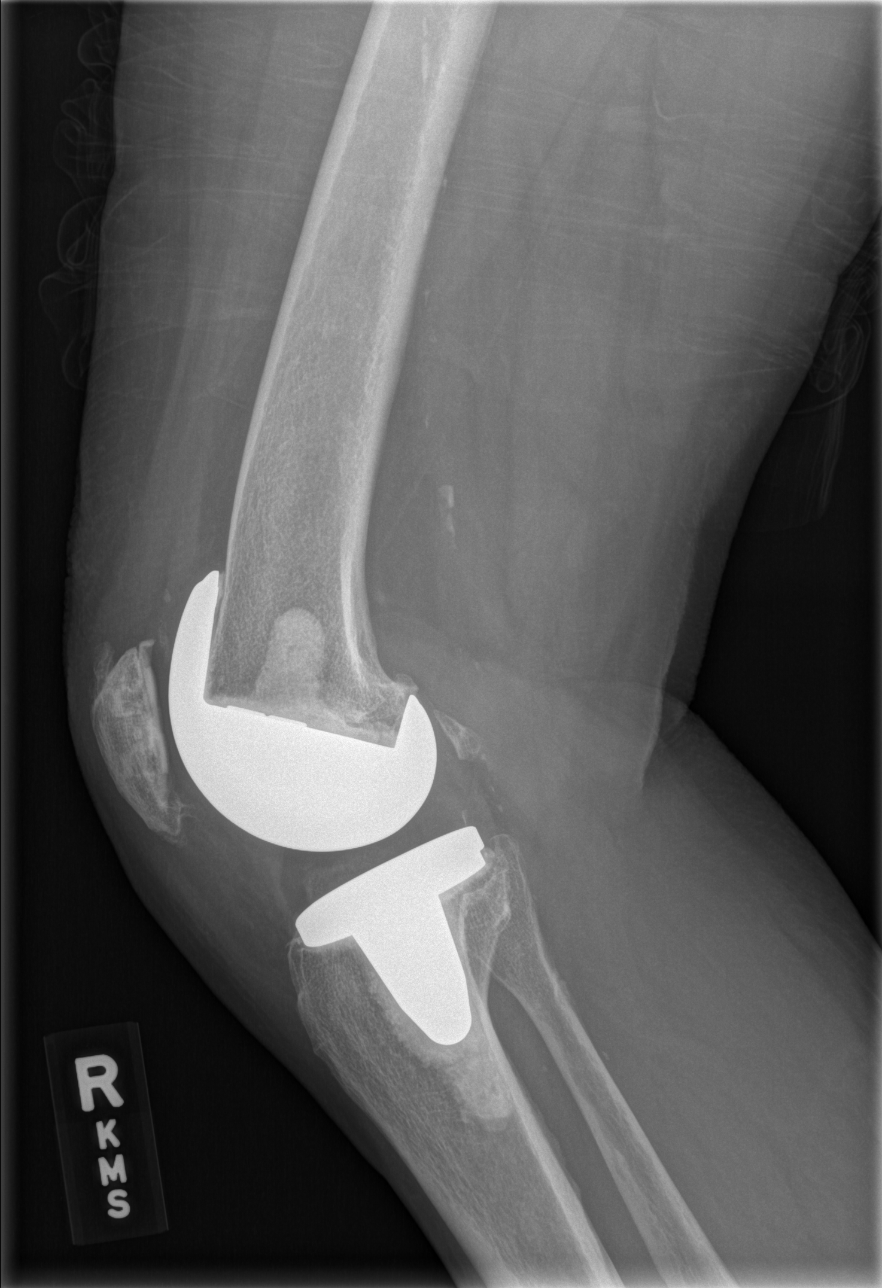

[4 of 4 positions shown; findings below may reference images not displayed]

FINDINGS: The patient has had right knee arthroplasty.  Alignment
is normal.  No evidence for acute fracture, subluxation, or joint
effusion.  Popliteal artery calcification.
IMPRESSION: No evidence for acute  abnormality. Knee arthroplasty.

## 2013-06-22 ENCOUNTER — Encounter (HOSPITAL_COMMUNITY): Payer: Medicare Other

## 2013-06-24 ENCOUNTER — Encounter (HOSPITAL_COMMUNITY): Payer: Medicare Other

## 2013-06-29 ENCOUNTER — Encounter (HOSPITAL_COMMUNITY): Payer: Medicare Other

## 2013-06-30 ENCOUNTER — Encounter: Payer: Self-pay | Admitting: Neurology

## 2013-06-30 ENCOUNTER — Ambulatory Visit (INDEPENDENT_AMBULATORY_CARE_PROVIDER_SITE_OTHER): Payer: Medicare Other | Admitting: Neurology

## 2013-06-30 VITALS — BP 128/74 | HR 87 | Ht 64.0 in | Wt 138.0 lb

## 2013-06-30 DIAGNOSIS — F3289 Other specified depressive episodes: Secondary | ICD-10-CM

## 2013-06-30 DIAGNOSIS — R0602 Shortness of breath: Secondary | ICD-10-CM

## 2013-06-30 DIAGNOSIS — R269 Unspecified abnormalities of gait and mobility: Secondary | ICD-10-CM

## 2013-06-30 DIAGNOSIS — I10 Essential (primary) hypertension: Secondary | ICD-10-CM

## 2013-06-30 DIAGNOSIS — F329 Major depressive disorder, single episode, unspecified: Secondary | ICD-10-CM

## 2013-06-30 DIAGNOSIS — E785 Hyperlipidemia, unspecified: Secondary | ICD-10-CM

## 2013-06-30 DIAGNOSIS — J449 Chronic obstructive pulmonary disease, unspecified: Secondary | ICD-10-CM

## 2013-06-30 DIAGNOSIS — J4489 Other specified chronic obstructive pulmonary disease: Secondary | ICD-10-CM

## 2013-06-30 NOTE — Progress Notes (Signed)
[facility]  PATIENT: [name] [name] DOB: Sep 04, 1936  HISTORICAL  [name] [name] is a 77 years old right-handed Caucasian female, referred by her primary care physician Dr. Lupe Carney for evaluation of gait difficulty.  She had past medical history of hypertension, diabetes, hyperlipidemia, atrial fibrillation, depression, anxiety, COPD, home oxygen dependent, presenting with gait difficulty since 2012  She fell, landed on her left parietal region, she has transient loss of consciousness, require stitches to her left skull, she had dizziness, was diagnosed with benign positional vertigo, underwent vestibular rehabilitation for 3 weeks, with improvement, but she still has intermittent episodes dizziness when lying on her right side, she felt spinning sensation, with mild hearing loss  She also has unsteady gait, difficulty walking, staggering to the right or left, subjective symmetric weakness of her bilateral lower extremity, no numbness, she has urinary incontinence since her fall in 2012,  UPDATE August 20th 2014:  She fell last night, she tried to pick up things off the floor, she leaned over, fell forwards, she complains of low back pain, she denies bilateral lower extremity weakness or numbness,  She has scratches and bruises in her arm, her hand felt shaky, she could hardly write.  She complains of low back pain, neck muscle pain, bilateral shoulder achy pain.  CT cervical spine showed multilevel degenerative disc and joint disease without focal neural impingement. no change since 01/06/2012. athritis is most  prominent at C3-4 through C5-6 on the left.     REVIEW OF SYSTEMS: Full 14 system review of systems performed and notable only for Weight loss, murmur, easy bruising, easy bleeding, shortness of breath, spinning sensation,incontinence, joints pain  ALLERGIES: Allergies  Allergen Reactions  . Calan (Verapamil Hcl) Other (See Comments)    Patient states it  makes her "out of her mind"; bp bottoms out  . Crestor (Rosuvastatin Calcium) Other (See Comments)    Muscle pain  . Escitalopram Oxalate Other (See Comments)    Unknown   . Lipitor (Atorvastatin Calcium) Other (See Comments)    myalgia  . Lopressor (Metoprolol Tartrate) Other (See Comments)    Blood pressure bottoms out   . Norvasc (Amlodipine Besylate) Other (See Comments)    fatigue  . Nsaids   . Prednisone     SWELLING  . Sulfa Antibiotics Nausea Only and Other (See Comments)    Makes her stomach hurt  . Vimovo (Naproxen-Esomeprazole)   . Penicillins Hives and Rash    HOME MEDICATIONS: Outpatient Prescriptions Prior to Visit  Medication Sig Dispense Refill  . aspirin 81 MG tablet Take 81 mg by mouth daily.      . bisoprolol (ZEBETA) 10 MG tablet Take 10 mg by mouth daily.      . budesonide-formoterol (SYMBICORT) 160-4.5 MCG/ACT inhaler Inhale 2 puffs into the lungs 2 (two) times daily.  1 Inhaler  0  . buPROPion (WELLBUTRIN XL) 300 MG 24 hr tablet Take 300 mg by mouth daily.      . calcium carbonate (TUMS) 500 MG chewable tablet Chew 1 tablet by mouth daily.      [name] conjugated estrogens (PREMARIN) vaginal cream Place 0.625 g vaginally daily as needed. For vaginal dryness      . diltiazem (CARDIZEM CD) 120 MG 24 hr capsule Take 120 mg by mouth 2 (two) times daily.       [name] diltiazem (CARDIZEM CD) 120 MG 24 hr capsule TAKE ONE CAPSULE TWICE A DAY  180 capsule  3  . famotidine (  PEPCID) 20 MG tablet Take 20 mg by mouth 2 (two) times daily as needed. For GERD      . fluticasone (FLONASE) 50 MCG/ACT nasal spray Place 2 sprays into the nose daily.  16 g  3  . furosemide (LASIX) 40 MG tablet Take 40 mg by mouth 2 (two) times daily.       [name] gabapentin (NEURONTIN) 300 MG capsule Take 300-600 mg by mouth 4 (four) times daily. Takes 1 tablet in the am, 2 dinner and 2 at bedtime      . HYDROcodone-acetaminophen (NORCO) 10-325 MG per tablet Take 1 tablet by mouth every 6 (six) hours as  needed. For pain      . LORazepam (ATIVAN) 0.5 MG tablet Take 0.5 mg by mouth every 6 (six) hours as needed. For anxiety      . losartan (COZAAR) 100 MG tablet Take 1 tablet (100 mg total) by mouth daily.  30 tablet  3  . metFORMIN (GLUCOPHAGE) 500 MG tablet Take 500 mg by mouth 2 (two) times daily.       . mirtazapine (REMERON) 45 MG tablet Take 45 mg by mouth at bedtime.      Docia Barrier IN Inhale into the lungs as directed.      . potassium chloride SA (K-DUR,KLOR-CON) 20 MEQ tablet Take 20 mEq by mouth daily.       [name] PROAIR HFA 108 (90 BASE) MCG/ACT inhaler Inhale 2 puffs into the lungs every 6 (six) hours as needed. For shortness of breath/wheezing      . sertraline (ZOLOFT) 100 MG tablet Take 200 mg by mouth daily.       . simvastatin (ZOCOR) 10 MG tablet Take 1 tablet (10 mg total) by mouth at bedtime.  90 tablet  1  . temazepam (RESTORIL) 7.5 MG capsule Take 7.5 mg by mouth at bedtime as needed. Insomnia       No facility-administered medications prior to visit.    PAST MEDICAL HISTORY: Past Medical History  Diagnosis Date  . Hypertrophic cardiomyopathy   . Hypertension   . Osteoarthritis   . Situational mixed anxiety and depressive disorder   . Hypercholesterolemia   . Pacemaker 1998  . Falls frequently     coumadin stopped, now on ASA 81mg   . Pulmonary HTN     has had prior right heart cath in 2010; was felt that most likely due to elevated left sided pressures and would not benefit from vasodilator therapy  . Diabetes mellitus   . Oxygen dependent   . COPD (chronic obstructive pulmonary disease)   . TIA (transient ischemic attack)   . Atrial fibrillation   . CHF (congestive heart failure)     PAST SURGICAL HISTORY: Past Surgical History  Procedure Laterality Date  . Knee arthroplasty  2011    right knee  . Tonsillectomy    . Appendectomy    . Cardiac catheterization    . Insert / replace / remove pacemaker    . Other surgical history       hysterectomy-bening    FAMILY HISTORY: Family History  Problem Relation Age of Onset  . Other Father     died in plane crash  . Cancer Mother     lymphoma-NHL  . Coronary artery disease Grandchild   . Heart disease Maternal Grandmother     SOCIAL HISTORY: Social History  . Marital Status: Widowed    Spouse Name: N/A    Number of Children: 2  .  Years of Education: high school   Occupational History    Retired as Lawyer   Social History Main Topics  . Smoking status: Former Smoker -- 1.00 packs/day for 40 years    Types: Cigarettes    Quit date: 11/15/2005  . Smokeless tobacco: Never Used  . Alcohol Use: No  . Drug Use: No  . Sexually Active: No    Social History Narrative   Former smoker   Orthoptist and part-time since married. Patient is widowed.      Daughter- Alveda Reasons [phone]      Right handed.  Patient has high school education.       PHYSICAL EXAM    Filed Vitals:   05/24/13 1452  BP: 106/53  Pulse: 61  Height: 5\' 4"  (1.626 m)  Weight: 146 lb (66.225 kg)   Body mass index is 23.68 kg/(m^2).   Generalized: In no acute distress  Neck: Supple, no carotid bruits   Cardiac: Regular rate rhythm  Pulmonary: Clear to auscultation bilaterally  Musculoskeletal: No deformity  Neurological examination  Mentation: Alert oriented to time, place, history taking, and causual conversation, wearing oxygen tank.  Cranial nerve II-XII: Pupils were equal round reactive to light extraocular movements were full, visual field were full on confrontational test. facial sensation and strength were normal. hearing was intact to finger rubbing bilaterally. Uvula tongue midline.  head turning and shoulder shrug and were normal and symmetric.Tongue protrusion into cheek strength was normal.  Motor: normal tone, bulk and strength.  Sensory: Intact to fine touch, pinprick, preserved vibratory sensation, and proprioception at toes.  Coordination: Normal  finger to nose, heel-to-shin bilaterally there was no truncal ataxia  Gait: Rising up from seated position by pushing on chair arm, wide based, cautious, could not do tiptoe, heels, and tandem walking  Romberg signs: Negative  Deep tendon reflexes: Brachioradialis 3/3, biceps 3/3, triceps 3/3, patellar 3/3, Achilles 2/2, plantar responses were flexor bilaterally.   DIAGNOSTIC DATA (LABS, IMAGING, TESTING) - I reviewed patient records, labs, notes, testing and imaging myself where available.  Lab Results  Component Value Date   WBC 9.1 05/07/2013   HGB 13.6 05/07/2013   HCT 38.7 05/07/2013   MCV 97.2 05/07/2013   PLT 265 05/07/2013      Component Value Date/Time   NA 139 05/10/2013 0610   K 3.8 05/10/2013 0610   CL 100 05/10/2013 0610   CO2 30 05/10/2013 0610   GLUCOSE 158* 05/10/2013 0610   BUN 20 05/10/2013 0610   CREATININE 0.89 05/10/2013 0610   CALCIUM 9.8 05/10/2013 0610   PROT 8.1 05/07/2013 2128   ALBUMIN 4.2 05/07/2013 2128   AST 39* 05/07/2013 2128   ALT 11 05/07/2013 2128   ALKPHOS 75 05/07/2013 2128   BILITOT 0.7 05/07/2013 2128   GFRNONAA 62* 05/10/2013 0610   GFRAA 72* 05/10/2013 0610   Lab Results  Component Value Date   CHOL 173 10/14/2012   HDL 39.50 10/14/2012   LDLCALC 109* 10/14/2012   TRIG 122.0 10/14/2012   CHOLHDL 4 10/14/2012   Lab Results  Component Value Date   HGBA1C 6.8* 05/08/2013   No results found for this basename: [id number]   Lab Results  Component Value Date   TSH 2.689 05/08/2013      ASSESSMENT AND PLAN  78 years old right-handed Caucasian female, with complicated past medical history, including COPD, oxygen dependent, atrial fibrillation, on aspirin 81 mg a day, hypertension, hyperlipidemia, presenting with gait difficulty, hyperreflexia on examination,  1. Her gait difficulty are multifactories including deconditioning, aging, multiple medical conditions, no evidence of cervical canal stenosis by CT cervical spine 2. physical therapy.  3. RTC  prn    Levert Feinstein, M.D. Ph.D.  [facility] [address], [address] [place], [place] [address] [phone]

## 2013-07-01 ENCOUNTER — Encounter (HOSPITAL_COMMUNITY): Payer: Medicare Other

## 2013-07-05 ENCOUNTER — Telehealth (HOSPITAL_COMMUNITY): Payer: Self-pay

## 2013-07-05 IMAGING — CR DG PELVIS 1-2V
1 series · 1 of 1 positions shown · non-contrast
Comparison: Abdominal radiograph performed 01/18/2011

CLINICAL DATA: Status post fall; lower back pain.

PELVIS - 1-2 VIEW

[t pelvis a.p.]
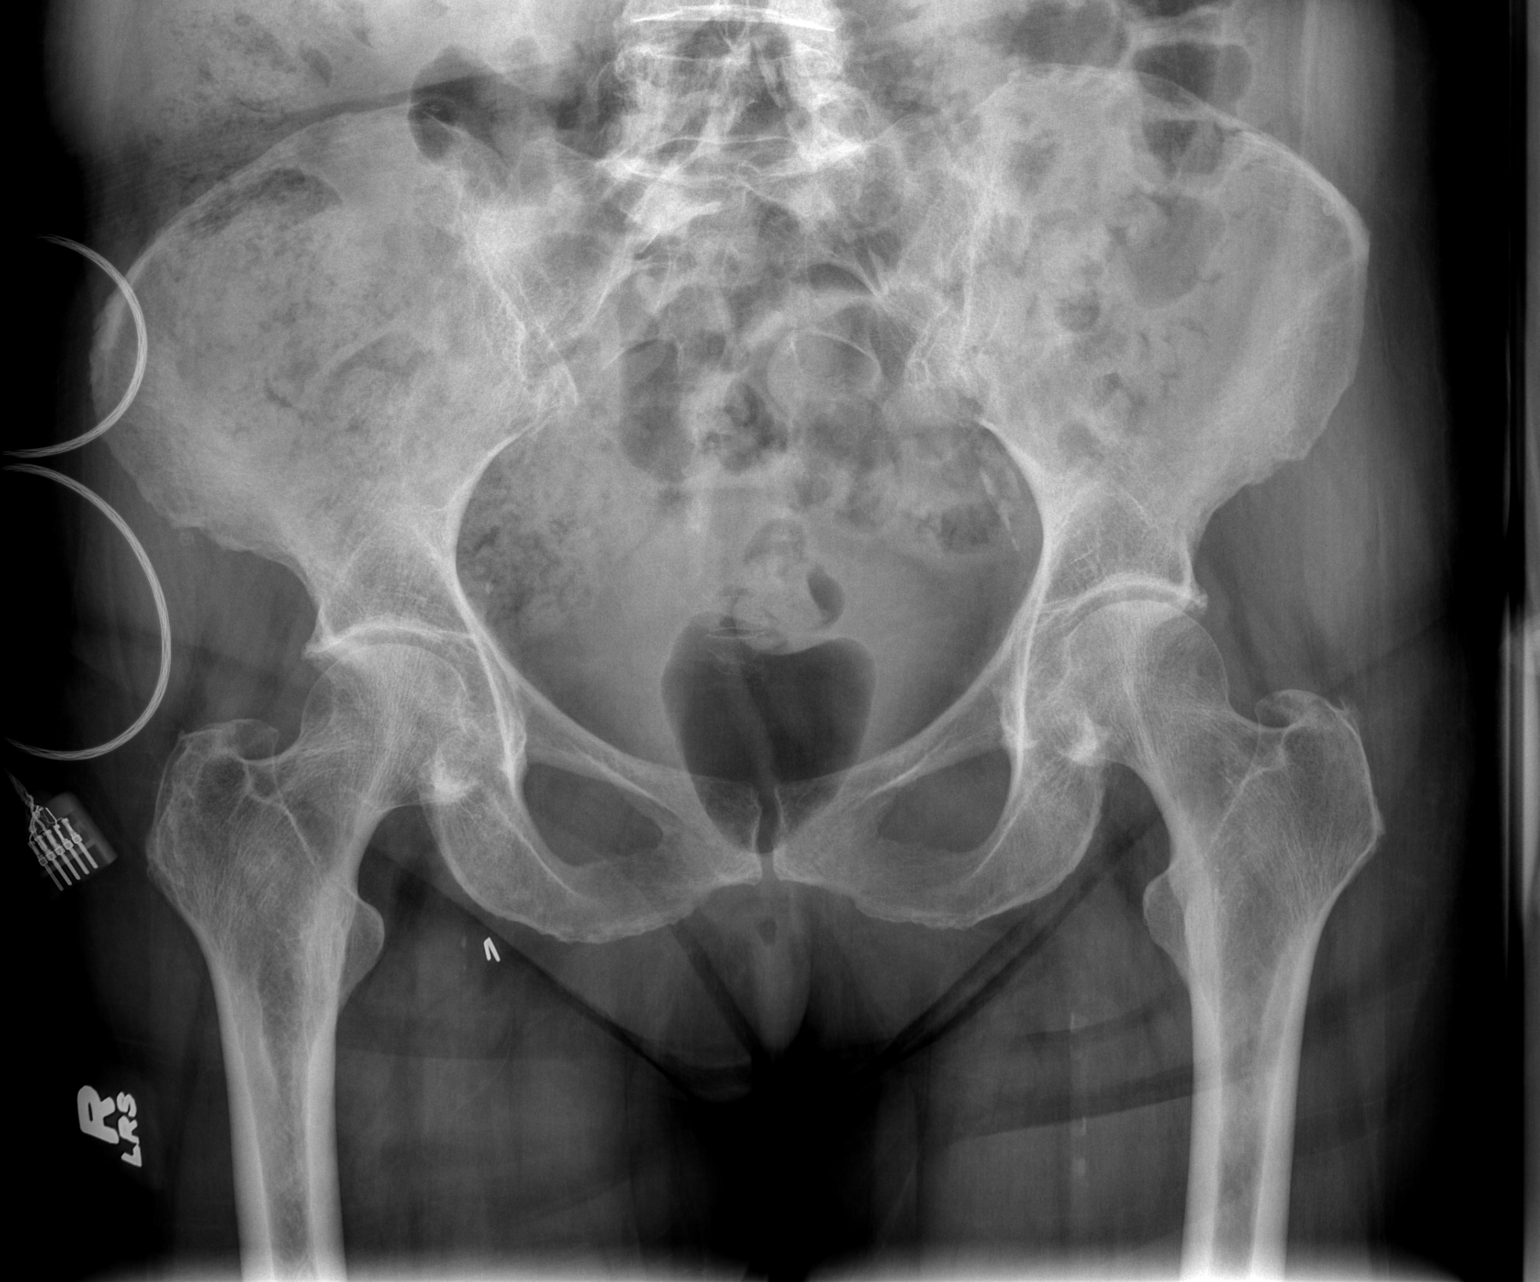

[1 of 1 positions shown; findings below may reference images not displayed]

FINDINGS: There is no evidence of fracture or dislocation.  Both
femoral heads are seated normally within their respective
acetabula.  No significant degenerative change is appreciated.
Sclerotic change is noted along the sacroiliac joints.

The visualized bowel gas pattern is grossly unremarkable in
appearance.  Clips are noted at the right inguinal region.
Scattered vascular calcifications are seen.
IMPRESSION: 1.  No evidence of fracture or dislocation.
2.  Scattered vascular calcifications seen.

## 2013-07-05 IMAGING — CT CT CERVICAL SPINE W/O CM
4 of 6 series · 14 of 33 positions shown, 16 images · non-contrast
Comparison: CT of the head performed 12/21/2011, and CT of the head
and cervical spine performed 01/02/2011

CT HEAD

CLINICAL DATA: Status post fall out of bed; diffuse pain.  Concern
for head or cervical spine injury.

CT HEAD WITHOUT CONTRAST AND CT CERVICAL SPINE WITHOUT CONTRAST
TECHNIQUE: Multidetector CT imaging of the head and cervical spine
was performed following the standard protocol without intravenous
contrast.  Multiplanar CT image reconstructions of the cervical
spine were also generated.

[Series 5: soft tissue · axial · 0.38mm/px · z∈[+959,+1057]mm · 3 of 99 slices shown]
[im 25/99  soft-tissue]
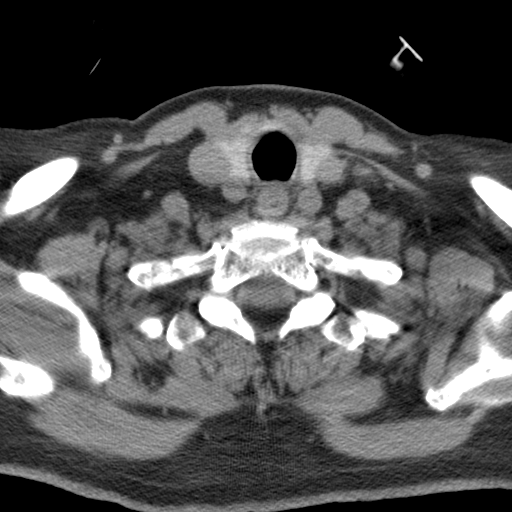
[im 50/99  soft-tissue]
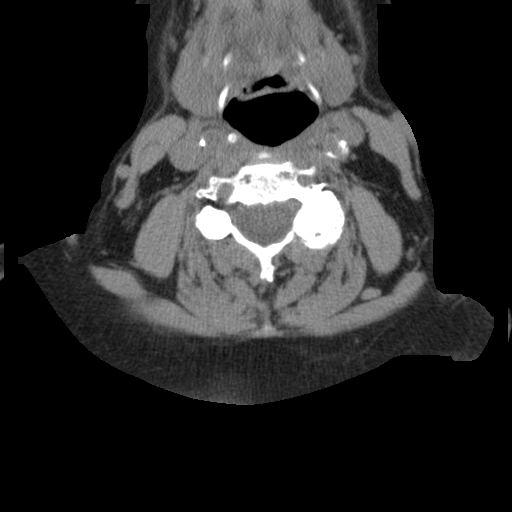
[im 74/99  soft-tissue]
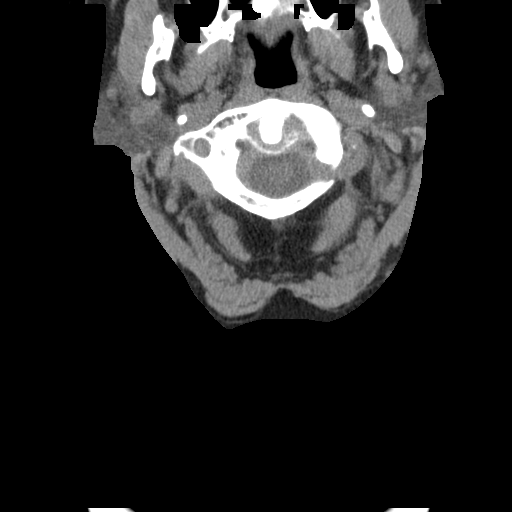

[mpr, sagittal, sagittal · sagittal · 0.38mm/px · 5 of 57 slices shown, 6 images]
[im 19/57  bone]
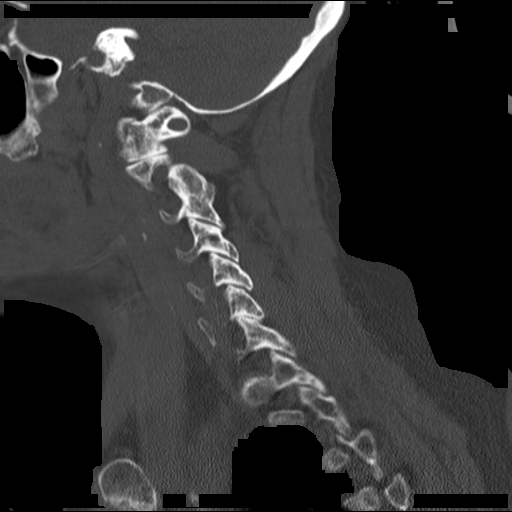
[im 24/57  bone]
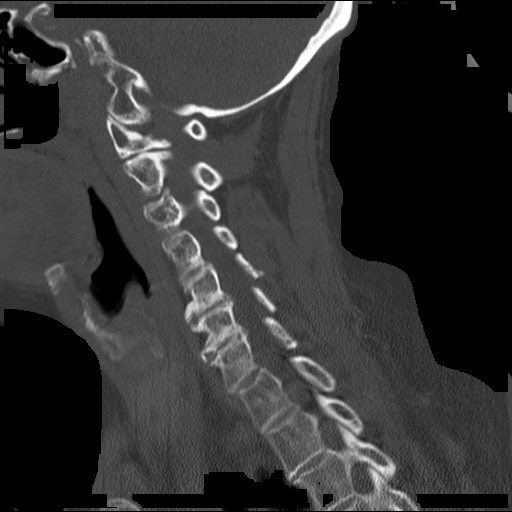
[im 29/57  soft-tissue]
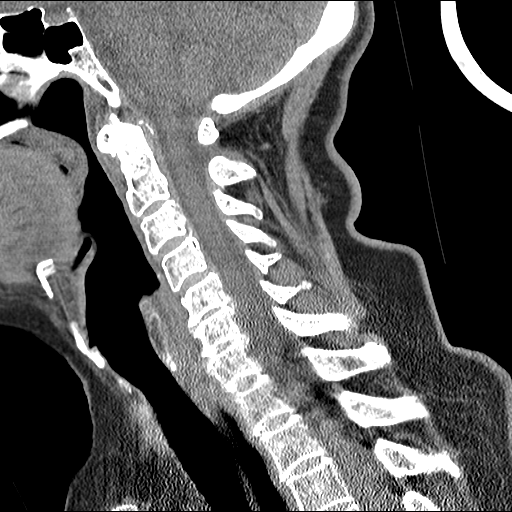
[im 29/57  bone]
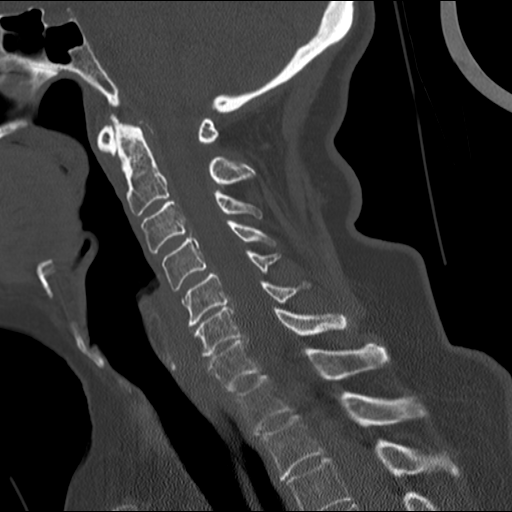
[im 33/57  bone]
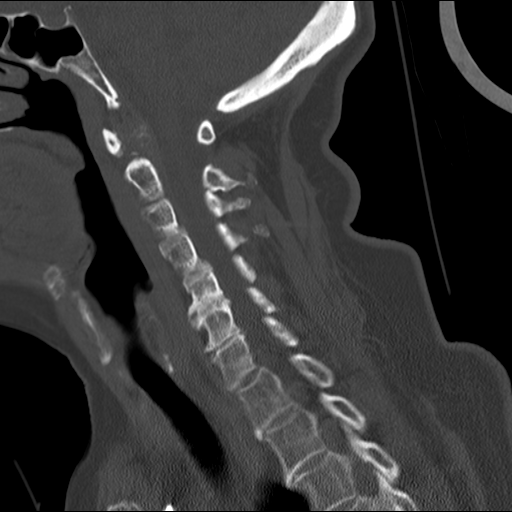
[im 38/57  bone]
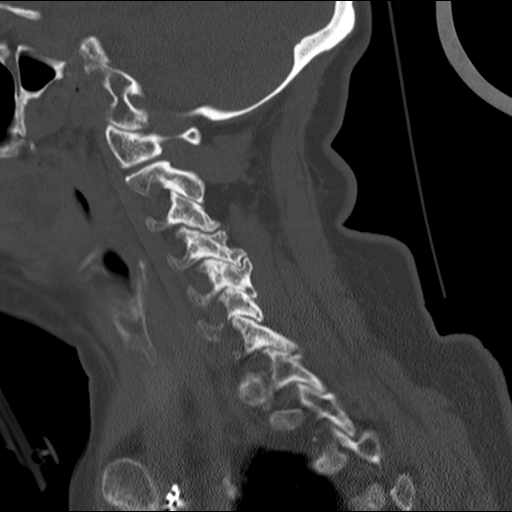

[cor · coronal · 0.38mm/px · 3 of 38 slices shown]
[im 8/38  bone]
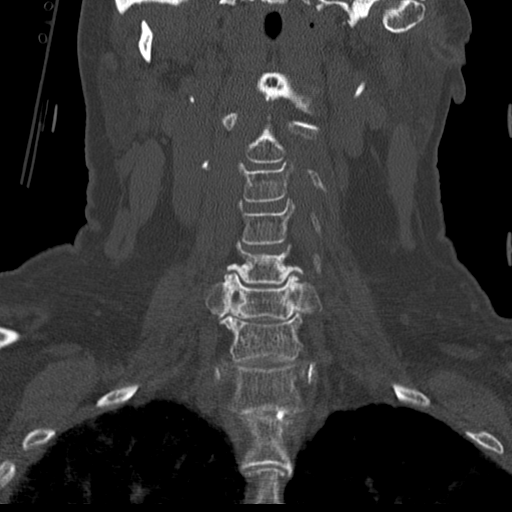
[im 15/38  bone]
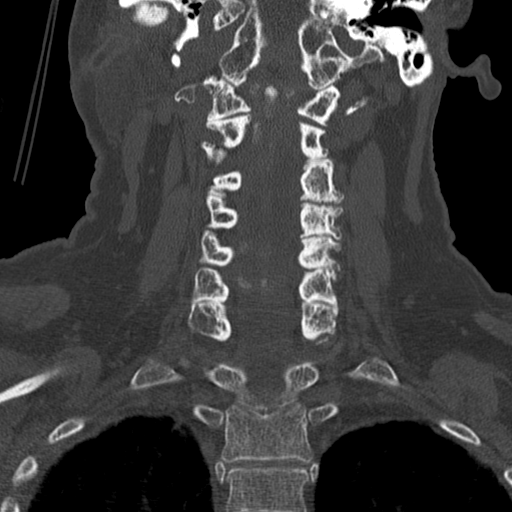
[im 23/38  bone]
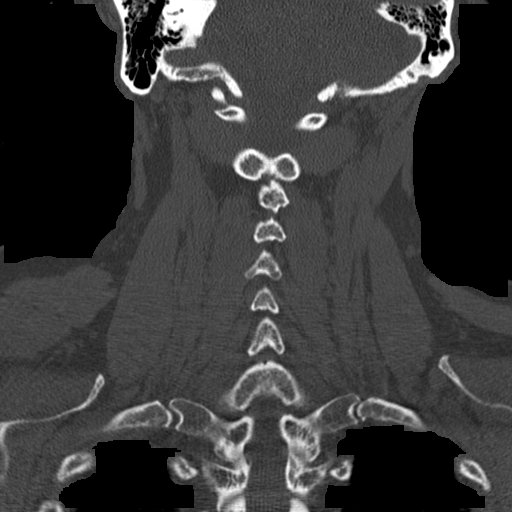

[orthogonal · axial · 0.38mm/px · z∈[+905,+1003]mm · 3 of 112 slices shown, 4 images]
[im 28/112  soft-tissue]
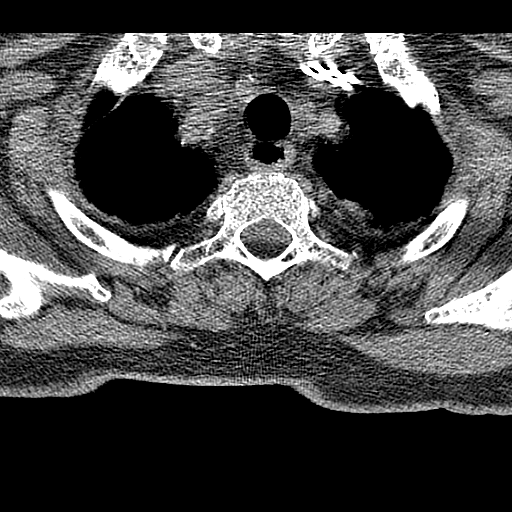
[im 28/112  bone]
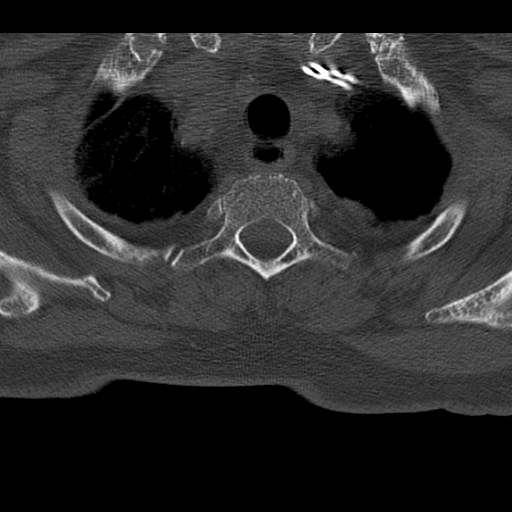
[im 56/112  bone]
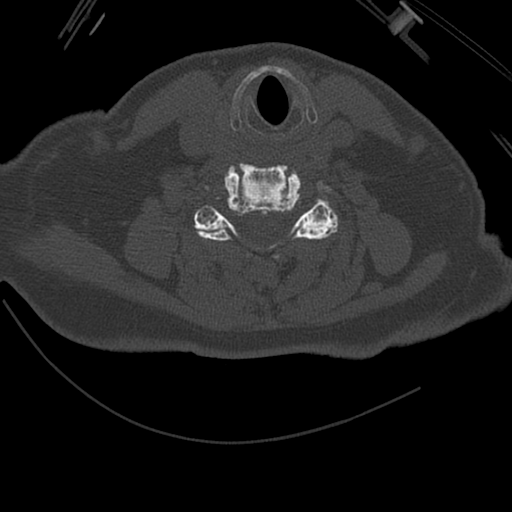
[im 84/112  bone]
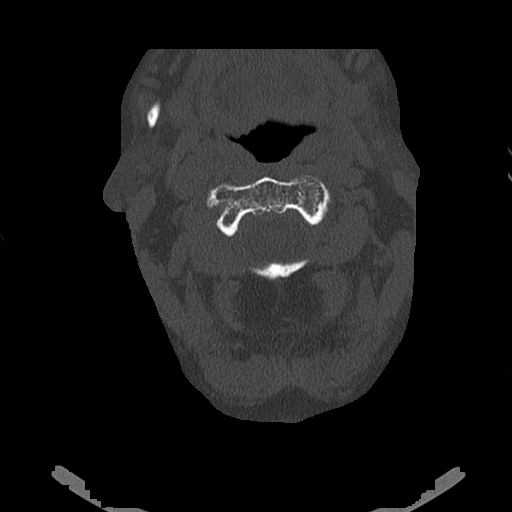

[14 of 33 positions shown; findings below may reference images not displayed]

FINDINGS: There is no evidence of acute infarction, mass lesion, or
intra- or extra-axial hemorrhage on CT.

Prominence of the ventricles and sulci reflects mild cortical
volume loss.  Cerebellar atrophy is noted.  Scattered
periventricular and subcortical white matter change likely reflects
small vessel ischemic angiopathy.  A chronic lacunar infarct is
noted within the right putamen.

The brainstem and fourth ventricle are within normal limits.  The
cerebral hemispheres demonstrate grossly normal gray-white
differentiation.  No mass effect or midline shift is seen.

There is no evidence of fracture; visualized osseous structures are
unremarkable in appearance.  The orbits are within normal limits.
The paranasal sinuses and mastoid air cells are well-aerated.  No
significant soft tissue abnormalities are seen.
IMPRESSION: 1.  No evidence of traumatic intracranial injury or fracture.
2.  Mild cortical volume loss and scattered small vessel ischemic
microangiopathy.
3.  Chronic lacunar infarct within the right putamen.

CT CERVICAL SPINE
FINDINGS: There is no evidence of fracture or subluxation.
Vertebral bodies demonstrate normal height and alignment.  There is
mild multilevel disc space narrowing noted along the lower cervical
spine, with anterior and posterior disc osteophyte complexes.  Mild
degenerative change is noted about the dens.  Prevertebral soft
tissues are within normal limits.

The thyroid gland is unremarkable in appearance. The lung apices
are not well assessed due to motion artifact, though there is mild
apparent interstitial prominence and scattered atelectasis
bilaterally.  No significant soft tissue abnormalities are seen.
Mild calcification is noted at the carotid bifurcations
bilaterally.
IMPRESSION: 1.  No evidence of fracture or subluxation along the cervical
spine.
2.  Mild degenerative change noted along the lower cervical spine.
3.  Mild apparent interstitial prominence and scattered atelectasis
noted at the lung apices.
4.  Mild calcification noted at the carotid bifurcations
bilaterally.

## 2013-07-05 IMAGING — CR DG SACRUM/COCCYX 2+V
3 series · 3 of 3 positions shown · non-contrast
Comparison: Lumbar spine radiographs performed 11/15/2011

CLINICAL DATA: Status post fall; lower back pain.

SACRUM AND COCCYX - 2+ VIEW

[t sacrum a.p.]
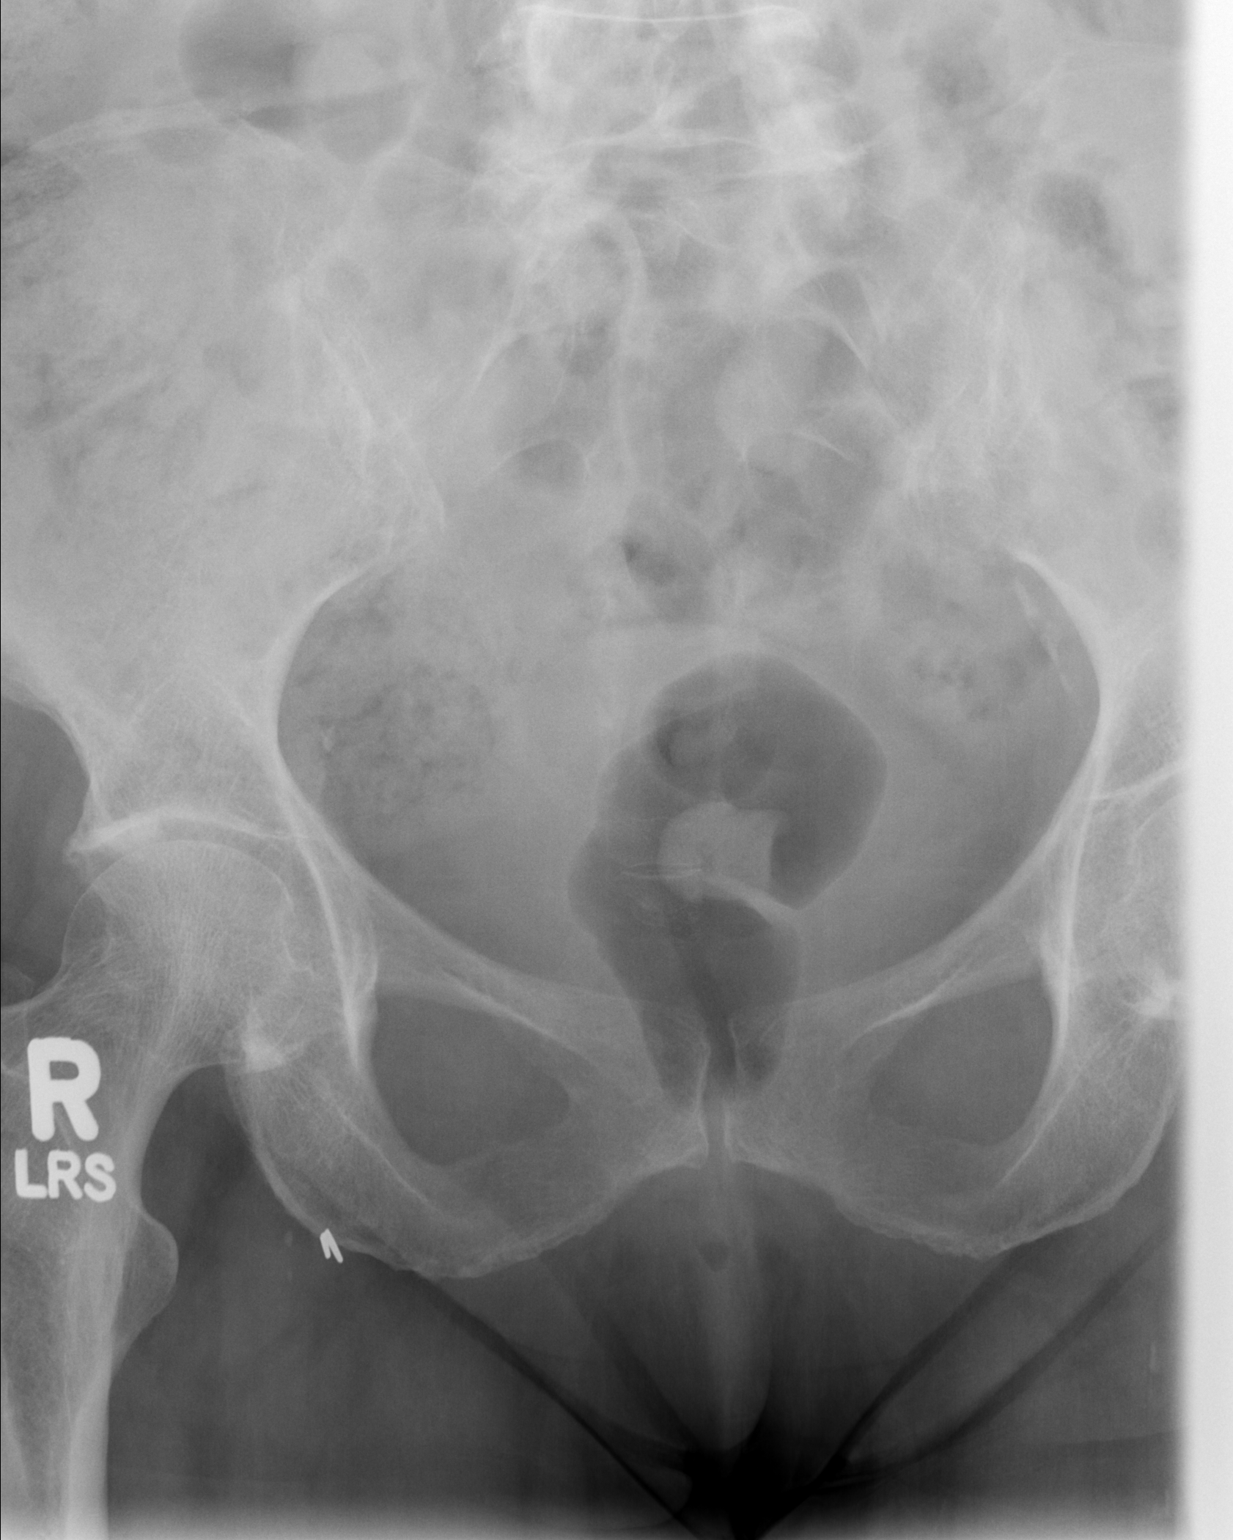

[t coccyx a.p.]
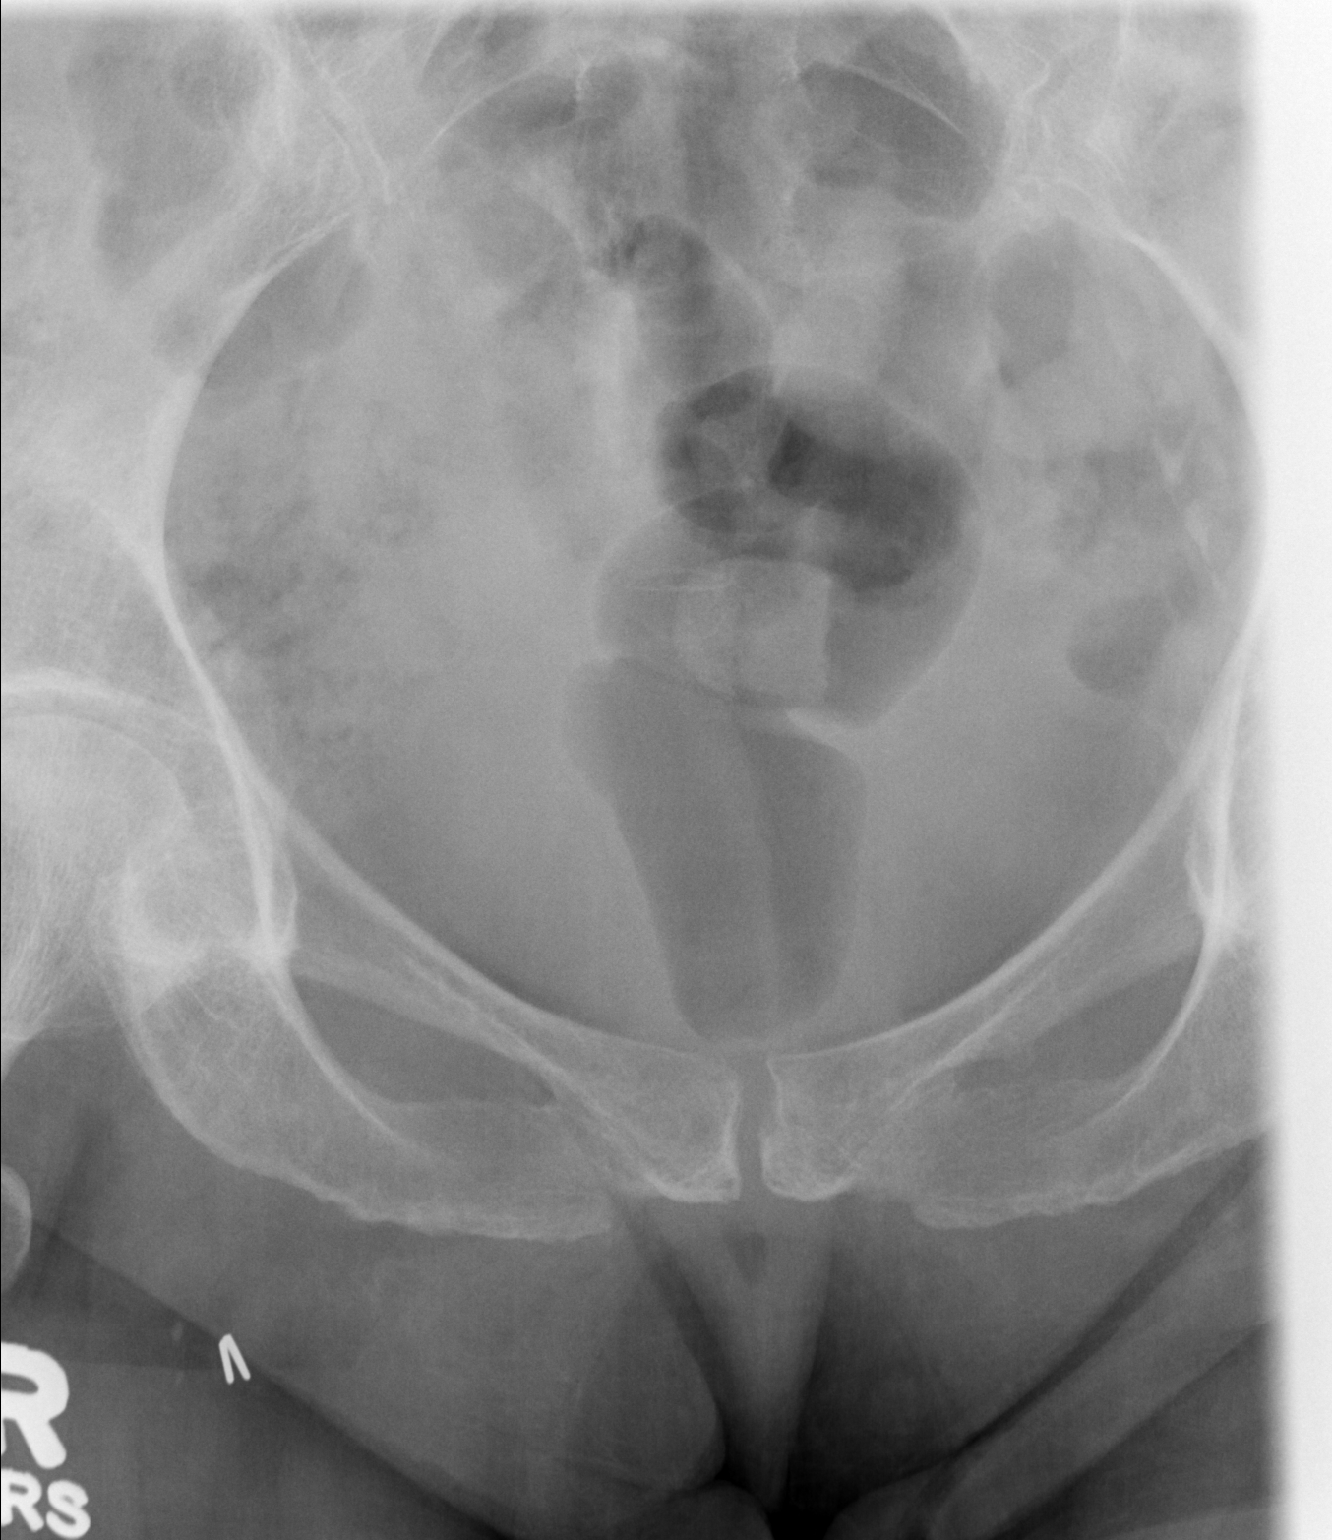

[t sacrum lat]
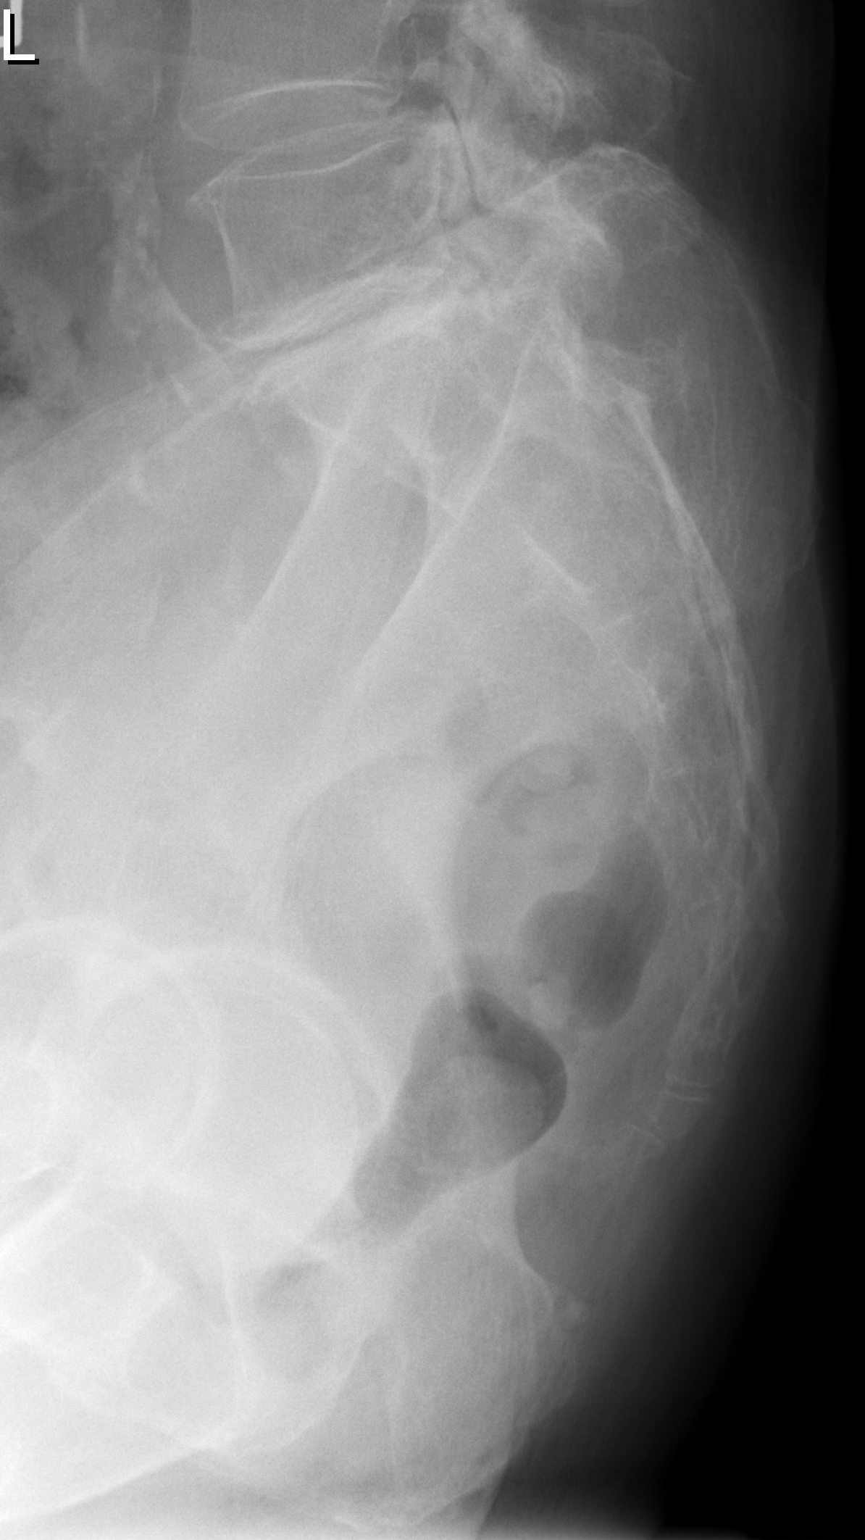

[3 of 3 positions shown; findings below may reference images not displayed]

FINDINGS: The sacrum and coccyx appear intact.  There is no
evidence of fracture.  The sacroiliac joints demonstrate mild
sclerotic change.  There is narrowing at the intervertebral disc
space at L5-S1, with facet disease noted along the lower lumbar
spine.

The visualized bowel gas pattern is grossly unremarkable.
Scattered vascular calcifications are seen.  Clips are noted at the
right inguinal region.
IMPRESSION: 1.  No evidence of fracture.
2.  Scattered vascular calcifications seen.
3.  Mild degenerative change at the lower lumbar spine.

## 2013-07-05 NOTE — Telephone Encounter (Signed)
Patient contacted me at the end of last week stating that she had had another fall, which makes that 2 falls in 2 weeks. Patient does not feel that she will be able to come back to Pulmonary Rehab.  I have spoke with her Pulmonary Rehab nurse and we feel that she would be a good candidate for the [facility] program. I will contact patient today to discuss this.

## 2013-07-06 ENCOUNTER — Encounter (HOSPITAL_COMMUNITY): Payer: Medicare Other

## 2013-07-08 ENCOUNTER — Encounter (HOSPITAL_COMMUNITY): Payer: Medicare Other

## 2013-07-13 ENCOUNTER — Encounter (HOSPITAL_COMMUNITY): Payer: Medicare Other

## 2013-07-15 ENCOUNTER — Encounter (HOSPITAL_COMMUNITY): Payer: Medicare Other

## 2013-07-20 ENCOUNTER — Encounter (HOSPITAL_COMMUNITY): Payer: Medicare Other

## 2013-07-22 ENCOUNTER — Encounter (HOSPITAL_COMMUNITY): Payer: Medicare Other

## 2013-07-26 ENCOUNTER — Encounter: Payer: Medicare Other | Admitting: *Deleted

## 2013-07-27 ENCOUNTER — Encounter (HOSPITAL_COMMUNITY): Payer: Medicare Other

## 2013-07-29 ENCOUNTER — Encounter (HOSPITAL_COMMUNITY): Payer: Medicare Other

## 2013-08-02 ENCOUNTER — Encounter: Payer: Self-pay | Admitting: *Deleted

## 2013-08-03 ENCOUNTER — Encounter (HOSPITAL_COMMUNITY): Payer: Medicare Other

## 2013-08-05 ENCOUNTER — Emergency Department (HOSPITAL_COMMUNITY): Payer: Medicare Other

## 2013-08-05 ENCOUNTER — Encounter (HOSPITAL_COMMUNITY): Payer: Self-pay | Admitting: *Deleted

## 2013-08-05 ENCOUNTER — Encounter (HOSPITAL_COMMUNITY): Payer: Medicare Other

## 2013-08-05 ENCOUNTER — Inpatient Hospital Stay (HOSPITAL_COMMUNITY)
Admission: EM | Admit: 2013-08-05 | Discharge: 2013-08-10 | DRG: 641 | Disposition: A | Discharge status: Skilled Nursing Facility | Payer: Medicare Other | Attending: Internal Medicine | Admitting: Internal Medicine

## 2013-08-05 DIAGNOSIS — R079 Chest pain, unspecified: Secondary | ICD-10-CM

## 2013-08-05 DIAGNOSIS — W19XXXA Unspecified fall, initial encounter: Secondary | ICD-10-CM | POA: Diagnosis present

## 2013-08-05 DIAGNOSIS — G459 Transient cerebral ischemic attack, unspecified: Secondary | ICD-10-CM

## 2013-08-05 DIAGNOSIS — R0603 Acute respiratory distress: Secondary | ICD-10-CM

## 2013-08-05 DIAGNOSIS — I429 Cardiomyopathy, unspecified: Secondary | ICD-10-CM

## 2013-08-05 DIAGNOSIS — J309 Allergic rhinitis, unspecified: Secondary | ICD-10-CM

## 2013-08-05 DIAGNOSIS — E1149 Type 2 diabetes mellitus with other diabetic neurological complication: Secondary | ICD-10-CM | POA: Diagnosis present

## 2013-08-05 DIAGNOSIS — E1142 Type 2 diabetes mellitus with diabetic polyneuropathy: Secondary | ICD-10-CM | POA: Diagnosis present

## 2013-08-05 DIAGNOSIS — Z9181 History of falling: Secondary | ICD-10-CM

## 2013-08-05 DIAGNOSIS — J449 Chronic obstructive pulmonary disease, unspecified: Secondary | ICD-10-CM

## 2013-08-05 DIAGNOSIS — E7800 Pure hypercholesterolemia, unspecified: Secondary | ICD-10-CM

## 2013-08-05 DIAGNOSIS — I340 Nonrheumatic mitral (valve) insufficiency: Secondary | ICD-10-CM

## 2013-08-05 DIAGNOSIS — F4323 Adjustment disorder with mixed anxiety and depressed mood: Secondary | ICD-10-CM

## 2013-08-05 DIAGNOSIS — I272 Pulmonary hypertension, unspecified: Secondary | ICD-10-CM

## 2013-08-05 DIAGNOSIS — R55 Syncope and collapse: Secondary | ICD-10-CM

## 2013-08-05 DIAGNOSIS — Z8673 Personal history of transient ischemic attack (TIA), and cerebral infarction without residual deficits: Secondary | ICD-10-CM

## 2013-08-05 DIAGNOSIS — Z9981 Dependence on supplemental oxygen: Secondary | ICD-10-CM

## 2013-08-05 DIAGNOSIS — E860 Dehydration: Principal | ICD-10-CM | POA: Diagnosis present

## 2013-08-05 DIAGNOSIS — M1990 Unspecified osteoarthritis, unspecified site: Secondary | ICD-10-CM

## 2013-08-05 DIAGNOSIS — I2789 Other specified pulmonary heart diseases: Secondary | ICD-10-CM | POA: Diagnosis present

## 2013-08-05 DIAGNOSIS — F411 Generalized anxiety disorder: Secondary | ICD-10-CM | POA: Diagnosis present

## 2013-08-05 DIAGNOSIS — R0602 Shortness of breath: Secondary | ICD-10-CM

## 2013-08-05 DIAGNOSIS — IMO0001 Reserved for inherently not codable concepts without codable children: Secondary | ICD-10-CM

## 2013-08-05 DIAGNOSIS — R413 Other amnesia: Secondary | ICD-10-CM

## 2013-08-05 DIAGNOSIS — J4489 Other specified chronic obstructive pulmonary disease: Secondary | ICD-10-CM

## 2013-08-05 DIAGNOSIS — R05 Cough: Secondary | ICD-10-CM

## 2013-08-05 DIAGNOSIS — Y92009 Unspecified place in unspecified non-institutional (private) residence as the place of occurrence of the external cause: Secondary | ICD-10-CM

## 2013-08-05 DIAGNOSIS — K219 Gastro-esophageal reflux disease without esophagitis: Secondary | ICD-10-CM | POA: Diagnosis present

## 2013-08-05 DIAGNOSIS — E785 Hyperlipidemia, unspecified: Secondary | ICD-10-CM

## 2013-08-05 DIAGNOSIS — F3289 Other specified depressive episodes: Secondary | ICD-10-CM

## 2013-08-05 DIAGNOSIS — M109 Gout, unspecified: Secondary | ICD-10-CM | POA: Diagnosis present

## 2013-08-05 DIAGNOSIS — E876 Hypokalemia: Secondary | ICD-10-CM

## 2013-08-05 DIAGNOSIS — F341 Dysthymic disorder: Secondary | ICD-10-CM | POA: Diagnosis present

## 2013-08-05 DIAGNOSIS — Z950 Presence of cardiac pacemaker: Secondary | ICD-10-CM

## 2013-08-05 DIAGNOSIS — R059 Cough, unspecified: Secondary | ICD-10-CM

## 2013-08-05 DIAGNOSIS — F329 Major depressive disorder, single episode, unspecified: Secondary | ICD-10-CM

## 2013-08-05 DIAGNOSIS — I10 Essential (primary) hypertension: Secondary | ICD-10-CM

## 2013-08-05 DIAGNOSIS — R5383 Other fatigue: Secondary | ICD-10-CM

## 2013-08-05 DIAGNOSIS — S22009A Unspecified fracture of unspecified thoracic vertebra, initial encounter for closed fracture: Secondary | ICD-10-CM | POA: Diagnosis present

## 2013-08-05 DIAGNOSIS — M549 Dorsalgia, unspecified: Secondary | ICD-10-CM

## 2013-08-05 DIAGNOSIS — I4891 Unspecified atrial fibrillation: Secondary | ICD-10-CM

## 2013-08-05 DIAGNOSIS — I422 Other hypertrophic cardiomyopathy: Secondary | ICD-10-CM

## 2013-08-05 DIAGNOSIS — R269 Unspecified abnormalities of gait and mobility: Secondary | ICD-10-CM

## 2013-08-05 DIAGNOSIS — Z96659 Presence of unspecified artificial knee joint: Secondary | ICD-10-CM

## 2013-08-05 DIAGNOSIS — I499 Cardiac arrhythmia, unspecified: Secondary | ICD-10-CM | POA: Diagnosis present

## 2013-08-05 DIAGNOSIS — J810 Acute pulmonary edema: Secondary | ICD-10-CM

## 2013-08-05 HISTORY — DX: Asymptomatic varicose veins of unspecified lower extremity: I83.90

## 2013-08-05 HISTORY — DX: Type 2 diabetes mellitus without complications: E11.9

## 2013-08-05 HISTORY — DX: Unspecified osteoarthritis, unspecified site: M19.90

## 2013-08-05 HISTORY — DX: Depression, unspecified: F32.A

## 2013-08-05 HISTORY — DX: Gout, unspecified: M10.9

## 2013-08-05 HISTORY — DX: Sacrococcygeal disorders, not elsewhere classified: M53.3

## 2013-08-05 HISTORY — DX: Anemia, unspecified: D64.9

## 2013-08-05 HISTORY — DX: Anxiety disorder, unspecified: F41.9

## 2013-08-05 HISTORY — DX: Major depressive disorder, single episode, unspecified: F32.9

## 2013-08-05 HISTORY — DX: Personal history of other diseases of the digestive system: Z87.19

## 2013-08-05 HISTORY — DX: Gastro-esophageal reflux disease without esophagitis: K21.9

## 2013-08-05 LAB — CBC WITH DIFFERENTIAL/PLATELET
Basophils Absolute: 0 K/uL (ref 0.0–0.1)
Basophils Relative: 0 % (ref 0–1)
Basophils Relative: 0 % (ref 0–1)
Eosinophils Absolute: 0.1 K/uL (ref 0.0–0.7)
Eosinophils Absolute: 0.2 10*3/uL (ref 0.0–0.7)
Eosinophils Relative: 1 % (ref 0–5)
HCT: 40.9 % (ref 36.0–46.0)
HCT: 39.8 % (ref 36.0–46.0)
Hemoglobin: 14.4 g/dL (ref 12.0–15.0)
Hemoglobin: 14 g/dL (ref 12.0–15.0)
Lymphocytes Relative: 7 % — ABNORMAL LOW (ref 12–46)
Lymphocytes Relative: 12 % (ref 12–46)
Lymphs Abs: 0.5 K/uL — ABNORMAL LOW (ref 0.7–4.0)
Lymphs Abs: 0.8 10*3/uL (ref 0.7–4.0)
MCH: 34 pg (ref 26.0–34.0)
MCH: 33.9 pg (ref 26.0–34.0)
MCHC: 35.2 g/dL (ref 30.0–36.0)
MCV: 96.5 fL (ref 78.0–100.0)
MCV: 96.4 fL (ref 78.0–100.0)
Monocytes Absolute: 0.4 K/uL (ref 0.1–1.0)
Monocytes Absolute: 0.4 10*3/uL (ref 0.1–1.0)
Monocytes Relative: 6 % (ref 3–12)
Monocytes Relative: 6 % (ref 3–12)
Neutro Abs: 6.6 K/uL (ref 1.7–7.7)
Neutrophils Relative %: 86 % — ABNORMAL HIGH (ref 43–77)
Neutrophils Relative %: 78 % — ABNORMAL HIGH (ref 43–77)
Platelets: 157 K/uL (ref 150–400)
RBC: 4.24 MIL/uL (ref 3.87–5.11)
RBC: 4.13 MIL/uL (ref 3.87–5.11)
RDW: 13.1 % (ref 11.5–15.5)
WBC: 7.7 K/uL (ref 4.0–10.5)

## 2013-08-05 LAB — URINALYSIS, ROUTINE W REFLEX MICROSCOPIC
Bilirubin Urine: NEGATIVE
Ketones, ur: NEGATIVE mg/dL
Nitrite: NEGATIVE
Protein, ur: NEGATIVE mg/dL
Urobilinogen, UA: 0.2 mg/dL (ref 0.0–1.0)

## 2013-08-05 LAB — MAGNESIUM: Magnesium: 1.5 mg/dL (ref 1.5–2.5)

## 2013-08-05 LAB — COMPREHENSIVE METABOLIC PANEL
ALT: 13 U/L (ref 0–35)
AST: 45 U/L — ABNORMAL HIGH (ref 0–37)
Calcium: 9.3 mg/dL (ref 8.4–10.5)
Chloride: 97 mEq/L (ref 96–112)
GFR calc Af Amer: 78 mL/min — ABNORMAL LOW (ref >90)
Sodium: 138 mEq/L (ref 135–145)
Total Protein: 7.4 g/dL (ref 6.0–8.3)

## 2013-08-05 LAB — BASIC METABOLIC PANEL WITH GFR
BUN: 14 mg/dL (ref 6–23)
CO2: 31 meq/L (ref 19–32)
Calcium: 9.5 mg/dL (ref 8.4–10.5)
Chloride: 97 meq/L (ref 96–112)
Creatinine, Ser: 0.77 mg/dL (ref 0.50–1.10)
GFR calc Af Amer: >90 mL/min (ref >90)
GFR calc non Af Amer: 80 mL/min — ABNORMAL LOW (ref >90)
Glucose, Bld: 133 mg/dL — ABNORMAL HIGH (ref 70–99)
Potassium: 2.8 meq/L — ABNORMAL LOW (ref 3.5–5.1)
Sodium: 138 meq/L (ref 135–145)

## 2013-08-05 LAB — LIPID PANEL
Cholesterol: 154 mg/dL (ref 0–200)
HDL: 52 mg/dL (ref >39)
LDL Cholesterol: 78 mg/dL (ref 0–99)
Total CHOL/HDL Ratio: 3 RATIO
VLDL: 24 mg/dL (ref 0–40)

## 2013-08-05 LAB — GLUCOSE, CAPILLARY: Glucose-Capillary: 158 mg/dL — ABNORMAL HIGH (ref 70–99)

## 2013-08-05 LAB — TROPONIN I: Troponin I: <0.3 ng/mL (ref <0.30)

## 2013-08-05 MED ORDER — SENNA 8.6 MG PO TABS
1.0000 | ORAL_TABLET | Freq: Two times a day (BID) | ORAL | Status: DC
  Administered 2013-08-06 – 2013-08-10 (×6): 8.6 mg via ORAL
  Filled 2013-08-05 (×11): qty 1

## 2013-08-05 MED ORDER — ONDANSETRON HCL 4 MG/2ML IJ SOLN: 4.0000 mg | Freq: Four times a day (QID) | INTRAMUSCULAR | Status: DC | PRN

## 2013-08-05 MED ORDER — SODIUM CHLORIDE 0.9 % IJ SOLN
3.0000 mL | Freq: Two times a day (BID) | INTRAMUSCULAR | Status: DC
  Administered 2013-08-07 – 2013-08-08 (×3): 3 mL via INTRAVENOUS

## 2013-08-05 MED ORDER — ALUM & MAG HYDROXIDE-SIMETH 500-450-40 MG/5ML PO SUSP
10.0000 mL | Freq: Four times a day (QID) | ORAL | Status: DC | PRN
Start: 2013-08-05 — End: 2013-08-05

## 2013-08-05 MED ORDER — INSULIN ASPART 100 UNIT/ML ~~LOC~~ SOLN
0.0000 [IU] | Freq: Three times a day (TID) | SUBCUTANEOUS | Status: DC
  Administered 2013-08-06 – 2013-08-07 (×2): 1 [IU] via SUBCUTANEOUS
  Administered 2013-08-08: 3 [IU] via SUBCUTANEOUS
  Administered 2013-08-09: 2 [IU] via SUBCUTANEOUS
  Administered 2013-08-10: 1 [IU] via SUBCUTANEOUS

## 2013-08-05 MED ORDER — SODIUM CHLORIDE 0.9 % IV BOLUS (SEPSIS)
500.0000 mL | Freq: Once | INTRAVENOUS | Status: AC
  Administered 2013-08-05: 500 mL via INTRAVENOUS

## 2013-08-05 MED ORDER — HYDROCODONE-ACETAMINOPHEN 7.5-325 MG PO TABS
1.0000 | ORAL_TABLET | ORAL | Status: DC | PRN
  Administered 2013-08-05 – 2013-08-10 (×14): 1 via ORAL
  Filled 2013-08-05 (×14): qty 1

## 2013-08-05 MED ORDER — TEMAZEPAM 7.5 MG PO CAPS
7.5000 mg | ORAL_CAPSULE | Freq: Every evening | ORAL | Status: DC | PRN
  Administered 2013-08-05 – 2013-08-09 (×5): 7.5 mg via ORAL
  Filled 2013-08-05 (×5): qty 1

## 2013-08-05 MED ORDER — MORPHINE SULFATE 4 MG/ML IJ SOLN
4.0000 mg | Freq: Once | INTRAMUSCULAR | Status: AC
  Administered 2013-08-05: 2 mg via INTRAVENOUS
  Filled 2013-08-05: qty 1

## 2013-08-05 MED ORDER — ALBUTEROL SULFATE HFA 108 (90 BASE) MCG/ACT IN AERS
2.0000 | INHALATION_SPRAY | Freq: Four times a day (QID) | RESPIRATORY_TRACT | Status: DC | PRN
  Filled 2013-08-05: qty 6.7

## 2013-08-05 MED ORDER — ONDANSETRON HCL 4 MG PO TABS: 4.0000 mg | ORAL_TABLET | Freq: Four times a day (QID) | ORAL | Status: DC | PRN

## 2013-08-05 MED ORDER — ACETAMINOPHEN 325 MG PO TABS
650.0000 mg | ORAL_TABLET | Freq: Four times a day (QID) | ORAL | Status: DC | PRN
  Filled 2013-08-05: qty 2

## 2013-08-05 MED ORDER — METFORMIN HCL 500 MG PO TABS
500.0000 mg | ORAL_TABLET | Freq: Two times a day (BID) | ORAL | Status: DC
  Filled 2013-08-05 (×2): qty 1

## 2013-08-05 MED ORDER — FAMOTIDINE 20 MG PO TABS
20.0000 mg | ORAL_TABLET | Freq: Two times a day (BID) | ORAL | Status: DC | PRN
  Filled 2013-08-05: qty 1

## 2013-08-05 MED ORDER — MORPHINE SULFATE 2 MG/ML IJ SOLN
2.0000 mg | INTRAMUSCULAR | Status: DC | PRN
  Administered 2013-08-08: 2 mg via INTRAVENOUS
  Filled 2013-08-05 (×2): qty 1

## 2013-08-05 MED ORDER — ONDANSETRON HCL 4 MG/2ML IJ SOLN: 4.0000 mg | Freq: Three times a day (TID) | INTRAMUSCULAR | Status: DC | PRN

## 2013-08-05 MED ORDER — SODIUM CHLORIDE 0.9 % IV SOLN
INTRAVENOUS | Status: DC
  Administered 2013-08-05 – 2013-08-07 (×3): via INTRAVENOUS

## 2013-08-05 MED ORDER — POTASSIUM CHLORIDE CRYS ER 20 MEQ PO TBCR
20.0000 meq | EXTENDED_RELEASE_TABLET | Freq: Two times a day (BID) | ORAL | Status: DC
  Administered 2013-08-05 – 2013-08-10 (×10): 20 meq via ORAL
  Filled 2013-08-05 (×15): qty 1

## 2013-08-05 MED ORDER — ENOXAPARIN SODIUM 40 MG/0.4ML ~~LOC~~ SOLN
40.0000 mg | SUBCUTANEOUS | Status: DC
  Administered 2013-08-05 – 2013-08-09 (×4): 40 mg via SUBCUTANEOUS
  Filled 2013-08-05 (×6): qty 0.4

## 2013-08-05 MED ORDER — ASPIRIN EC 81 MG PO TBEC
81.0000 mg | DELAYED_RELEASE_TABLET | Freq: Every day | ORAL | Status: DC
  Administered 2013-08-05 – 2013-08-10 (×6): 81 mg via ORAL
  Filled 2013-08-05 (×6): qty 1

## 2013-08-05 MED ORDER — BISOPROLOL FUMARATE 10 MG PO TABS
10.0000 mg | ORAL_TABLET | Freq: Every day | ORAL | Status: DC
  Administered 2013-08-05 – 2013-08-10 (×6): 10 mg via ORAL
  Filled 2013-08-05 (×6): qty 1

## 2013-08-05 MED ORDER — SERTRALINE HCL 100 MG PO TABS
200.0000 mg | ORAL_TABLET | Freq: Every day | ORAL | Status: DC
  Administered 2013-08-05 – 2013-08-10 (×6): 200 mg via ORAL
  Filled 2013-08-05 (×6): qty 2

## 2013-08-05 MED ORDER — FUROSEMIDE 40 MG PO TABS
40.0000 mg | ORAL_TABLET | Freq: Every day | ORAL | Status: DC
  Administered 2013-08-06 – 2013-08-07 (×2): 40 mg via ORAL
  Filled 2013-08-05 (×3): qty 1

## 2013-08-05 MED ORDER — GABAPENTIN 300 MG PO CAPS
300.0000 mg | ORAL_CAPSULE | Freq: Three times a day (TID) | ORAL | Status: DC
  Administered 2013-08-05 – 2013-08-10 (×14): 300 mg via ORAL
  Filled 2013-08-05 (×17): qty 1

## 2013-08-05 MED ORDER — ALUM & MAG HYDROXIDE-SIMETH 200-200-20 MG/5ML PO SUSP: 15.0000 mL | Freq: Four times a day (QID) | ORAL | Status: DC | PRN

## 2013-08-05 MED ORDER — DILTIAZEM HCL ER COATED BEADS 120 MG PO CP24
120.0000 mg | ORAL_CAPSULE | Freq: Two times a day (BID) | ORAL | Status: DC
  Administered 2013-08-05 – 2013-08-10 (×10): 120 mg via ORAL
  Filled 2013-08-05 (×12): qty 1

## 2013-08-05 MED ORDER — MORPHINE SULFATE 4 MG/ML IJ SOLN
4.0000 mg | Freq: Once | INTRAMUSCULAR | Status: AC
  Administered 2013-08-05: 4 mg via INTRAVENOUS
  Filled 2013-08-05: qty 1

## 2013-08-05 MED ORDER — SIMVASTATIN 10 MG PO TABS
10.0000 mg | ORAL_TABLET | Freq: Every day | ORAL | Status: DC
  Administered 2013-08-05 – 2013-08-09 (×5): 10 mg via ORAL
  Filled 2013-08-05 (×6): qty 1

## 2013-08-05 MED ORDER — INSULIN ASPART 100 UNIT/ML ~~LOC~~ SOLN: 0.0000 [IU] | SUBCUTANEOUS | Status: DC

## 2013-08-05 MED ORDER — HYDROCODONE-ACETAMINOPHEN 5-325 MG PO TABS: 1.0000 | ORAL_TABLET | ORAL | Status: DC | PRN

## 2013-08-05 MED ORDER — LORAZEPAM 1 MG PO TABS: 1.0000 mg | ORAL_TABLET | Freq: Two times a day (BID) | ORAL | Status: DC | PRN

## 2013-08-05 MED ORDER — BUPROPION HCL ER (XL) 300 MG PO TB24
300.0000 mg | ORAL_TABLET | Freq: Every day | ORAL | Status: DC
  Administered 2013-08-05 – 2013-08-10 (×6): 300 mg via ORAL
  Filled 2013-08-05 (×6): qty 1

## 2013-08-05 MED ORDER — POTASSIUM CHLORIDE CRYS ER 20 MEQ PO TBCR
40.0000 meq | EXTENDED_RELEASE_TABLET | Freq: Once | ORAL | Status: AC
  Administered 2013-08-05: 40 meq via ORAL
  Filled 2013-08-05: qty 2

## 2013-08-05 MED ORDER — FLUTICASONE PROPIONATE 50 MCG/ACT NA SUSP
2.0000 | NASAL | Status: DC | PRN
  Filled 2013-08-05: qty 16

## 2013-08-05 MED ORDER — ACETAMINOPHEN 650 MG RE SUPP: 650.0000 mg | Freq: Four times a day (QID) | RECTAL | Status: DC | PRN

## 2013-08-05 MED ORDER — BUDESONIDE-FORMOTEROL FUMARATE 160-4.5 MCG/ACT IN AERO
2.0000 | INHALATION_SPRAY | Freq: Two times a day (BID) | RESPIRATORY_TRACT | Status: DC | PRN
  Filled 2013-08-05: qty 6

## 2013-08-05 MED ORDER — LOSARTAN POTASSIUM 50 MG PO TABS
50.0000 mg | ORAL_TABLET | Freq: Every day | ORAL | Status: DC
  Administered 2013-08-05 – 2013-08-10 (×6): 50 mg via ORAL
  Filled 2013-08-05 (×6): qty 1

## 2013-08-05 NOTE — ED Notes (Signed)
Patient to xray at this time

## 2013-08-05 NOTE — H&P (Signed)
[facility] History and Physical  [name] [name]:[id number] DOB: [birth year] DOA: 08/05/2013  Referring physician:  PCP: Benita Stabile, MD  Specialists:  Chief Complaint: Syncope vs  Presyncope; fall  HPI: [name] [name] 77 yo WF PMHx depression,  COPD, HTN, hypertrophic cardiomyopathy, A-Fib with pacemaker, O2 dependent, Pulm HTN, DM type II with neuropathy. presents with neck pain and fall at home. Pt was standing and about to use bathroom and she felt lightheaded then tripped on the edge of the shower causing her to hit her back and head. Patient also states that she has been having multiple episodes of falling this week (family members state at least 6 times her she sought no medical attention). States she had 3 weeks of diarrhea where she has been seeing her PCP for treatment. States diarrhea stopped 2 days ago. No loc, cp or sob. Pain in upper back with palpation and breathing since. Pt has had other lightheaded episodes. Decreased po intake recently. Pt feels improved in ED. No blood thinners. Stated has not been using compression stockings as directed nor to taking her pulmonary inhalers as directed.    Review of Systems: The patient denies anorexia, fever, weight loss,, vision loss, decreased hearing, hoarseness, chest pain, syncope, dyspnea on exertion, peripheral edema, , hemoptysis, abdominal pain, melena, hematochezia, severe indigestion/heartburn, hematuria, incontinence, genital sores, muscle weakness, suspicious skin lesions, transient blindness, depression, unusual weight change, abnormal bleeding, enlarged lymph nodes, angioedema, and breast masses.    Procedure CT head without contrast, CT C-spine 08/05/2013  No skull fracture is noted. Paranasal sinuses and mastoid air cells  are unremarkable.  No intracranial hemorrhage, mass effect or midline shift. No acute  cortical infarction. No mass lesion is noted on this unenhanced  scan. Stable cerebral atrophy. Stable  periventricular and patchy  subcortical chronic white matter disease. Ventricular size is stable  from prior exam.  CT CERVICAL SPINE FINDINGS  Comparison exam 05/31/2013  Findings:  Axial images of the cervical spine shows no acute fracture or  subluxation.  Computer processed images shows no acute fracture or subluxation.  Stable degenerative changes C1-C2 articulation. Again noted disc  space flattening with mild anterior and minimal posterior spurring  at C5-C6 and C6-C7 level. Minimal disc space flattening at C7-T1  level. No prevertebral soft tissue swelling. Cervical airway is  patent.  There is no pneumothorax in visualized lung apices. Bilateral apical  scarring is noted.  Echocardiogram 04/30/2013 - Left ventricle: mild concentric hypertrophy. Systolic function was normal.  -LVEF= 60% -65%. Wall motion was normal; there were no regional wall motion abnormalities. - Aortic valve: A bicuspid morphology cannot be excluded. Mild thickening and calcification. - Mitral valve: Mild to moderate regurgitation. - Left atrium: The atrium was mildly dilated. - Right atrium: The atrium was mildly dilated. - Atrial septum: No defect or patent foramen ovale was identified. - Tricuspid valve: Moderate regurgitation. - Pulmonary arteries: PA peak pressure: 60mm Hg (S). Impressions: - The right ventricular systolic pressure was increased consistent with moderate pulmonary hypertension.     Past Medical History  Diagnosis Date  . Hypertrophic cardiomyopathy   . Hypertension   . Situational mixed anxiety and depressive disorder   . Hypercholesterolemia   . Falls frequently     coumadin stopped  . Pulmonary HTN     has had prior right heart cath in 2010; was felt that most likely due to elevated left sided pressures and would not benefit from vasodilator therapy  . Oxygen dependent     "  3L 24/7" (08/05/2013)  . COPD (chronic obstructive pulmonary disease)   . TIA (transient ischemic  attack)   . Atrial fibrillation     Chronic; No longer on coumadin  . CHF (congestive heart failure)   . Pacemaker   . Family history of anesthesia complication     "they have to breath for mom for awhile after anesthesia" (08/05/2013)  . Heart murmur   . Varicose veins   . Pneumonia     "couple times; long time ago" (08/05/2013)  . Exertional shortness of breath   . Type II diabetes mellitus   . Anemia     "as a child" (08/05/2013)  . GERD (gastroesophageal reflux disease)   . H/O hiatal hernia   . Osteoarthritis   . Arthritis     "all over me; in all my joints" (08/05/2013)  . Coccyx pain   . Gout     "right foot" (08/05/2013)  . Anxiety   . Depression    Past Surgical History  Procedure Laterality Date  . Total knee arthroplasty Right 2011  . Tonsillectomy    . Appendectomy    . Abdominal hysterectomy      "partial" (08/05/2013)  . Dilation and curettage of uterus      "several" (08/05/2013)  . Foot neuroma surgery Right   . Insert / replace / remove pacemaker  1998; 2000's; 2014  . Cardiac catheterization      "more than once" (08/05/2013)  . Back surgery      "bone spur removed; mid back" (08/05/2013)   Social History:  reports that she quit smoking about 7 years ago. Her smoking use included Cigarettes. She has a 40 pack-year smoking history. She has never used smokeless tobacco. She reports that she does not drink alcohol or use illicit drugs. Lives by herself   Allergies  Allergen Reactions  . Calan [Verapamil Hcl] Other (See Comments)    Patient states it makes her "out of her mind"; bp bottoms out  . Crestor [Rosuvastatin Calcium] Other (See Comments)    Muscle pain  . Escitalopram Oxalate Other (See Comments)    Unknown   . Lipitor [Atorvastatin Calcium] Other (See Comments)    myalgia  . Lopressor [Metoprolol Tartrate] Other (See Comments)    Blood pressure bottoms out   . Norvasc [Amlodipine Besylate] Other (See Comments)    fatigue  . Nsaids Nausea  Only  . Prednisone     SWELLING  . Sulfa Antibiotics Nausea Only and Other (See Comments)    Makes her stomach hurt  . Vimovo [Naproxen-Esomeprazole] Nausea Only  . Penicillins Hives and Rash    Family History  Problem Relation Age of Onset  . Other Father     died in plane crash  . Cancer Mother     lymphoma-NHL  . Coronary artery disease Grandchild   . Heart disease Maternal Grandmother      Prior to Admission medications   Medication Sig Start Date End Date Taking? Authorizing Provider  aluminum & magnesium hydroxide-simethicone (MYLANTA) 500-450-40 MG/5ML suspension Take 10 mLs by mouth every 6 (six) hours as needed for indigestion.   Yes Historical Provider, MD  bisoprolol (ZEBETA) 10 MG tablet Take 10 mg by mouth daily.   Yes Historical Provider, MD  budesonide-formoterol (SYMBICORT) 160-4.5 MCG/ACT inhaler Inhale 2 puffs into the lungs 2 (two) times daily as needed (sortness of breath).   Yes Historical Provider, MD  buPROPion (WELLBUTRIN XL) 300 MG 24 hr tablet Take 300 mg  by mouth daily.   Yes Historical Provider, MD  conjugated estrogens (PREMARIN) vaginal cream Place 0.625 g vaginally daily as needed. For vaginal dryness   Yes Historical Provider, MD  diltiazem (CARDIZEM CD) 120 MG 24 hr capsule Take 120 mg by mouth 2 (two) times daily.  05/19/12  Yes Cassell Clement, MD  famotidine (PEPCID) 20 MG tablet Take 20 mg by mouth 2 (two) times daily as needed. For GERD   Yes Historical Provider, MD  fluticasone (FLONASE) 50 MCG/ACT nasal spray Place 2 sprays into both nostrils as needed for rhinitis or allergies.   Yes Historical Provider, MD  furosemide (LASIX) 40 MG tablet Take 1 tablet (40 mg total) by mouth daily. 06/16/13  Yes Cassell Clement, MD  gabapentin (NEURONTIN) 300 MG capsule Take 300 mg by mouth 3 (three) times daily.    Yes Historical Provider, MD  HYDROcodone-acetaminophen (NORCO) 7.5-325 MG per tablet Take 1 tablet by mouth 3 (three) times daily as needed for pain.    Yes Historical Provider, MD  LORazepam (ATIVAN) 1 MG tablet Take 1 mg by mouth 2 (two) times daily as needed for anxiety. 1 tablet in the morning then 1 tablet as needed.   Yes Historical Provider, MD  losartan (COZAAR) 50 MG tablet Take 1 tablet (50 mg total) by mouth daily. 06/16/13  Yes Cassell Clement, MD  metFORMIN (GLUCOPHAGE) 500 MG tablet Take 500 mg by mouth 2 (two) times daily.    Yes Historical Provider, MD  OXYGEN-HELIUM IN Inhale into the lungs as directed.   Yes Historical Provider, MD  potassium chloride SA (K-DUR,KLOR-CON) 20 MEQ tablet Take 20 mEq by mouth 2 (two) times daily.  09/29/12  Yes Cassell Clement, MD  PROAIR HFA 108 (90 BASE) MCG/ACT inhaler Inhale 2 puffs into the lungs every 6 (six) hours as needed. For shortness of breath/wheezing 02/11/12  Yes Historical Provider, MD  sertraline (ZOLOFT) 100 MG tablet Take 200 mg by mouth daily.    Yes Historical Provider, MD  simvastatin (ZOCOR) 10 MG tablet Take 10 mg by mouth at bedtime.   Yes Historical Provider, MD  temazepam (RESTORIL) 7.5 MG capsule Take 7.5 mg by mouth at bedtime as needed. Insomnia   Yes Historical Provider, MD   Physical Exam: Filed Vitals:   08/05/13 1500 08/05/13 1514 08/05/13 1608 08/05/13 1646  BP: 155/70  144/110 122/59  Pulse: 89  92 88  Temp:  98.5 F (36.9 C) 98.6 F (37 C) 97.9 F (36.6 C)  TempSrc:   Oral Oral  Resp: 20  16 16   Height:    5\' 4"  (1.626 m)  Weight:    67.6 kg (149 lb 0.5 oz)  SpO2: 97%  97% 95%     General: Alert, mild distress secondary to back pain  Eyes: Pupils equal reactive to light and accommodation  Neck: Cervical stiffness secondary to pain  Cardiovascular: Irregular irregular rhythm and rate, negative murmurs rubs or gallops, DP/PT pulse one plus bilateral  Respiratory: Clear to auscultation bilateral  Abdomen: Soft, nontender, nondistended bowel sounds  Skin: Negative lacerations  Musculoskeletal: Negative pedal edema bilaterally moves all  extremities  Neurologic: Pupils equal and reactive to light and accommodation, cranial nerves II through XII intact, tongue/uvula midline, strength in all extremities 5/5, sensation intact throughout except in lower extremities was decreased sensation bilaterally (chronic), did not ambulate patient  Labs on Admission:  Basic Metabolic Panel:  Recent Labs Lab 08/05/13 1315  NA 138  K 2.8*  CL 97  CO2  31  GLUCOSE 133*  BUN 14  CREATININE 0.77  CALCIUM 9.5  MG 1.5   Liver Function Tests: No results found for this basename: AST, ALT, ALKPHOS, BILITOT, PROT, ALBUMIN,  in the last 168 hours No results found for this basename: LIPASE, AMYLASE,  in the last 168 hours No results found for this basename: AMMONIA,  in the last 168 hours CBC:  Recent Labs Lab 08/05/13 1315  WBC 7.7  NEUTROABS 6.6  HGB 14.4  HCT 40.9  MCV 96.5  PLT 157   Cardiac Enzymes:  Recent Labs Lab 08/05/13 1315  TROPONINI <0.30    BNP (last 3 results)  Recent Labs  11/22/12 1425 12/11/12 1135 05/07/13 2128  PROBNP 1995.0* 283.0* 1129.0*   CBG: No results found for this basename: GLUCAP,  in the last 168 hours  Radiological Exams on Admission: Dg Chest 1 View  08/05/2013   CLINICAL DATA:  Fall, neck pain  EXAM: CHEST - 1 VIEW  COMPARISON:  05/27/2013  FINDINGS: Cardiomegaly again noted. Dual lead cardiac pacemaker is unchanged in position. There is mild interstitial prominence bilaterally without convincing pulmonary edema. No gross fractures are identified. No segmental infiltrate. No diagnostic pneumothorax.  IMPRESSION: There is mild interstitial prominence bilaterally without convincing pulmonary edema. No gross fractures are identified. No segmental infiltrate. No diagnostic pneumothorax.   Electronically Signed   By: Natasha Mead   On: 08/05/2013 12:26   Dg Thoracic Spine 2 View  08/05/2013   CLINICAL DATA:  Fall, neck pain, back pain  EXAM: THORACIC SPINE - 2 VIEW  COMPARISON:  Lateral view  of the chest 05/27/2013  FINDINGS: Three views of thoracic spine submitted. Alignment is preserved. There is mild compression deformity probable T8 vertebral body. This is new from prior exam. Clinical correlation is necessary. Acute fracture cannot be excluded.  IMPRESSION: There is mild compression fracture superior endplate of T8 vertebral body new from prior exam. Acute fracture cannot be excluded. Clinical correlation is necessary.   Electronically Signed   By: Natasha Mead   On: 08/05/2013 12:30   Ct Head Wo Contrast  08/05/2013   CLINICAL DATA:  Fall, dizziness  EXAM: CT HEAD WITHOUT CONTRAST  CT CERVICAL SPINE WITHOUT CONTRAST  TECHNIQUE: Multidetector CT imaging of the head and cervical spine was performed following the standard protocol without intravenous contrast. Multiplanar CT image reconstructions of the cervical spine were also generated.  COMPARISON:  04/01/2013  FINDINGS: CT HEAD FINDINGS  No skull fracture is noted. Paranasal sinuses and mastoid air cells are unremarkable.  No intracranial hemorrhage, mass effect or midline shift. No acute cortical infarction. No mass lesion is noted on this unenhanced scan. Stable cerebral atrophy. Stable periventricular and patchy subcortical chronic white matter disease. Ventricular size is stable from prior exam.  CT CERVICAL SPINE FINDINGS  Comparison exam 05/31/2013  Findings:  Axial images of the cervical spine shows no acute fracture or subluxation.  Computer processed images shows no acute fracture or subluxation. Stable degenerative changes C1-C2 articulation. Again noted disc space flattening with mild anterior and minimal posterior spurring at C5-C6 and C6-C7 level. Minimal disc space flattening at C7-T1 level. No prevertebral soft tissue swelling. Cervical airway is patent.  There is no pneumothorax in visualized lung apices. Bilateral apical scarring is noted.  IMPRESSION: CT HEAD IMPRESSION  No acute intracranial abnormality. Stable atrophy and  chronic white matter disease.  CT CERVICAL SPINE IMPRESSION  1. No acute fracture or subluxation. Stable degenerative changes as described above.  Electronically Signed   By: Natasha Mead   On: 08/05/2013 12:50   Ct Cervical Spine Wo Contrast  08/05/2013   CLINICAL DATA:  Fall, dizziness  EXAM: CT HEAD WITHOUT CONTRAST  CT CERVICAL SPINE WITHOUT CONTRAST  TECHNIQUE: Multidetector CT imaging of the head and cervical spine was performed following the standard protocol without intravenous contrast. Multiplanar CT image reconstructions of the cervical spine were also generated.  COMPARISON:  04/01/2013  FINDINGS: CT HEAD FINDINGS  No skull fracture is noted. Paranasal sinuses and mastoid air cells are unremarkable.  No intracranial hemorrhage, mass effect or midline shift. No acute cortical infarction. No mass lesion is noted on this unenhanced scan. Stable cerebral atrophy. Stable periventricular and patchy subcortical chronic white matter disease. Ventricular size is stable from prior exam.  CT CERVICAL SPINE FINDINGS  Comparison exam 05/31/2013  Findings:  Axial images of the cervical spine shows no acute fracture or subluxation.  Computer processed images shows no acute fracture or subluxation. Stable degenerative changes C1-C2 articulation. Again noted disc space flattening with mild anterior and minimal posterior spurring at C5-C6 and C6-C7 level. Minimal disc space flattening at C7-T1 level. No prevertebral soft tissue swelling. Cervical airway is patent.  There is no pneumothorax in visualized lung apices. Bilateral apical scarring is noted.  IMPRESSION: CT HEAD IMPRESSION  No acute intracranial abnormality. Stable atrophy and chronic white matter disease.  CT CERVICAL SPINE IMPRESSION  1. No acute fracture or subluxation. Stable degenerative changes as described above.   Electronically Signed   By: Natasha Mead   On: 08/05/2013 12:50    EKG: compared to EKG 04/17/2013 no significant chain; atrial fib, LVH,  diffuse ST-T wave depression   Assessment/Plan Active Problems:   * No active hospital problems. *   1. Acute onset of falls; rule out arrhythmia (obtaining EKG) --Obtain serial troponins --Obtain echocardiogram --CT scan negative for acute CVA, no neurological deficits however if this changes consider MRI -- Hold as many sedating meds as possible  2. pulmonary hypertension; see  3. HTN; follow patient's BP to run tonight, in order to assure no orthostatic hypotension. --Obtain orthostatic BP   4.COPD; restart patient on correct hm meds  5. DM type 2; hold home medications will control with SSI --Obtain hemoglobin A1c, lipid panel --Continue Neurontin for neuropathic pain  6. HLD; see #4  7. acute back pain; continue patient on home medications; additional morphine when necessary  Code Status: Full Family Communication: Family present at bedside for discussion of plan of care Disposition Plan:   Time spent: 90 minutes  Drema [facility] Pager [phone]  If 7PM-7AM, please contact night-coverage [email or web address] Password [facility] 08/05/2013, 6:05 PM

## 2013-08-05 NOTE — ED Notes (Signed)
Lab called again for troponin results.  They stated it should be crossed over.

## 2013-08-05 NOTE — ED Notes (Addendum)
To ED for eval after becoming dizzy while in shower and falling. Pt is on O2 2L [place] chronically. No CP. Pt arrives on LSB. Hx of chronic back pain. CBG 154. VSS for EMS

## 2013-08-05 NOTE — ED Notes (Signed)
C collar removed per Dr. Zavitz. 

## 2013-08-05 NOTE — ED Notes (Signed)
Lab called and stated they did not have troponin - but I sent it.  Then stated they had it.

## 2013-08-05 NOTE — ED Provider Notes (Signed)
CSN: [id number]     Arrival date & time 08/05/13  1037 History   First MD Initiated Contact with Patient 08/05/13 1057     Chief Complaint  Patient presents with  . Fall  . Dizziness  . Atrial Fibrillation   (Consider location/radiation/quality/duration/timing/severity/associated sxs/prior Treatment) HPI Comments: 77 yo female with copd, htn, a fib, pacemaker, O2 dependent, pulm htn presents with neck pain and fall at home.  Pt was standing and about to use bathroom and she felt lightheaded then tripped on the bathtub causing her to hit her back and head.  No loc, cp or sob.  Pain in upper back with palpation and breathing since.  Pt has had other lightheaded episodes.  Decreased po intake recently.  Pt feels improved in ED.  No blood thinners.  Patient is a 77 y.o. female presenting with fall and atrial fibrillation. The history is provided by the patient.  Fall This is a recurrent problem. Pertinent negatives include no chest pain, no abdominal pain, no headaches and no shortness of breath.  Atrial Fibrillation Pertinent negatives include no chest pain, no abdominal pain, no headaches and no shortness of breath.    Past Medical History  Diagnosis Date  . Hypertrophic cardiomyopathy   . Hypertension   . Osteoarthritis   . Situational mixed anxiety and depressive disorder   . Hypercholesterolemia   . Pacemaker   . Falls frequently     coumadin stopped  . Pulmonary HTN     has had prior right heart cath in 2010; was felt that most likely due to elevated left sided pressures and would not benefit from vasodilator therapy  . Diabetes mellitus   . Pacemaker   . Oxygen dependent   . COPD (chronic obstructive pulmonary disease)   . TIA (transient ischemic attack)   . Atrial fibrillation     Chronic; No longer on coumadin  . CHF (congestive heart failure)    Past Surgical History  Procedure Laterality Date  . Knee arthroplasty  2011    right knee  . Tonsillectomy    .  Appendectomy    . Cardiac catheterization    . Insert / replace / remove pacemaker    . Other surgical history      hysterectomy-bening   Family History  Problem Relation Age of Onset  . Other Father     died in plane crash  . Cancer Mother     lymphoma-NHL  . Coronary artery disease Grandchild   . Heart disease Maternal Grandmother    History  Substance Use Topics  . Smoking status: Former Smoker -- 1.00 packs/day for 40 years    Types: Cigarettes    Quit date: 11/15/2005  . Smokeless tobacco: Never Used  . Alcohol Use: No   OB History   Grav Para Term Preterm Abortions TAB SAB Ect Mult Living                 Review of Systems  Constitutional: Positive for fatigue. Negative for fever and chills.  HENT: Negative for neck pain and neck stiffness.   Eyes: Negative for visual disturbance.  Respiratory: Negative for shortness of breath.   Cardiovascular: Negative for chest pain.  Gastrointestinal: Negative for vomiting and abdominal pain.  Genitourinary: Negative for dysuria and flank pain.  Musculoskeletal: Positive for back pain.  Skin: Negative for rash.  Neurological: Positive for light-headedness. Negative for headaches.    Allergies  Calan; Crestor; Escitalopram oxalate; Lipitor; Lopressor; Norvasc; Nsaids; Prednisone; Sulfa  antibiotics; Vimovo; and Penicillins  Home Medications   Current Outpatient Rx  Name  Route  Sig  Dispense  Refill  . aspirin 81 MG tablet   Oral   Take 81 mg by mouth daily.         . bisoprolol (ZEBETA) 10 MG tablet   Oral   Take 10 mg by mouth daily.         [name] buPROPion (WELLBUTRIN XL) 300 MG 24 hr tablet   Oral   Take 300 mg by mouth daily.         . calcium carbonate (TUMS) 500 MG chewable tablet   Oral   Chew 1 tablet by mouth daily.         [name] conjugated estrogens (PREMARIN) vaginal cream   Vaginal   Place 0.625 g vaginally daily as needed. For vaginal dryness         . diltiazem (CARDIZEM CD) 120 MG 24 hr  capsule   Oral   Take 120 mg by mouth 2 (two) times daily.          . famotidine (PEPCID) 20 MG tablet   Oral   Take 20 mg by mouth 2 (two) times daily as needed. For GERD         . fluticasone (FLONASE) 50 MCG/ACT nasal spray   Nasal   Place 2 sprays into the nose daily.   16 g   3   . furosemide (LASIX) 40 MG tablet   Oral   Take 1 tablet (40 mg total) by mouth daily.   30 tablet   5     REDUCED DOSE   . gabapentin (NEURONTIN) 300 MG capsule   Oral   Take 300-600 mg by mouth 4 (four) times daily. Takes 1 tablet in the am, 2 dinner and 2 at bedtime         . HYDROcodone-acetaminophen (NORCO) 10-325 MG per tablet   Oral   Take 1 tablet by mouth every 6 (six) hours as needed. For pain         . LORazepam (ATIVAN) 0.5 MG tablet   Oral   Take 0.5 mg by mouth every 6 (six) hours as needed. For anxiety         . losartan (COZAAR) 50 MG tablet   Oral   Take 1 tablet (50 mg total) by mouth daily.   30 tablet   3     REDUCED DOSE   . metFORMIN (GLUCOPHAGE) 500 MG tablet   Oral   Take 500 mg by mouth 2 (two) times daily.          . mirtazapine (REMERON) 45 MG tablet   Oral   Take 45 mg by mouth at bedtime.         . OXYGEN-HELIUM IN   Inhalation   Inhale into the lungs as directed.         . potassium chloride SA (K-DUR,KLOR-CON) 20 MEQ tablet   Oral   Take 20 mEq by mouth daily.          [name] PROAIR HFA 108 (90 BASE) MCG/ACT inhaler   Inhalation   Inhale 2 puffs into the lungs every 6 (six) hours as needed. For shortness of breath/wheezing         . sertraline (ZOLOFT) 100 MG tablet   Oral   Take 200 mg by mouth daily.          . simvastatin (ZOCOR) 10 MG tablet  TAKE 1 TABLET BY MOUTH AT BEDTIME   90 tablet   2   . SYMBICORT 160-4.5 MCG/ACT inhaler      INHALE 2 PUFFS INTO THE LUNGS 2 TIMES DAILY   1 Inhaler   6   . temazepam (RESTORIL) 7.5 MG capsule   Oral   Take 7.5 mg by mouth at bedtime as needed. Insomnia           BP 156/62  Pulse 70  Temp(Src) 98.5 F (36.9 C) (Oral)  Resp 20  SpO2 96% Physical Exam  Nursing note and vitals reviewed. Constitutional: She is oriented to person, place, and time. She appears well-developed and well-nourished.  HENT:  Head: Normocephalic and atraumatic.  Dry mm  Eyes: Conjunctivae are normal. Right eye exhibits no discharge. Left eye exhibits no discharge.  Neck: Normal range of motion. Neck supple. No tracheal deviation present.  Cardiovascular: Normal rate and regular rhythm.   No murmur heard. Pulmonary/Chest: Effort normal and breath sounds normal.  Abdominal: Soft. She exhibits no distension. There is no tenderness. There is no guarding.  Musculoskeletal: She exhibits tenderness (mid thoracic midline). She exhibits no edema.  No pain at hips or knees bilateral with full rom  Neurological: She is alert and oriented to person, place, and time. No cranial nerve deficit. GCS eye subscore is 4. GCS verbal subscore is 5. GCS motor subscore is 6.  5+ strength in UE and LE with f/e at major joints. Sensation to palpation intact in UE and LE. CNs 2-12 grossly intact.  EOMFI.  PERRL.   Finger nose and coordination intact bilateral.   Visual fields intact to finger testing.   Skin: Skin is warm. No rash noted.  Psychiatric: She has a normal mood and affect.    ED Course  Procedures (including critical care time) Labs Review Labs Reviewed  CBC WITH DIFFERENTIAL - Abnormal; Notable for the following:    Neutrophils Relative % 86 (*)    Lymphocytes Relative 7 (*)    Lymphs Abs 0.5 (*)    All other components within normal limits  BASIC METABOLIC PANEL - Abnormal; Notable for the following:    Potassium 2.8 (*)    Glucose, Bld 133 (*)    GFR calc non Af Amer 80 (*)    All other components within normal limits  TROPONIN I   Imaging Review No results found.  MDM  No diagnosis found. Cardiac and trauma eval. Pain meds and fluids. Presyncope, similar to  previous.  Likely dehydration, will check heart/ labs.  Date: 08/05/2013  Rate: 75  Rhythm: atrial fibrillation  QRS Axis: normal  Intervals: QT prolonged  ST/T Wave abnormalities: nonspecific ST changes, nonspecific T wave changes, ST depressions inferiorly and ST depressions laterally  Conduction Disutrbances:LVH  Narrative Interpretation:   Old EKG Reviewed: unchanged T wave inversion and ST depressions similar to previous Dg Chest 1 View  08/05/2013   CLINICAL DATA:  Fall, neck pain  EXAM: CHEST - 1 VIEW  COMPARISON:  05/27/2013  FINDINGS: Cardiomegaly again noted. Dual lead cardiac pacemaker is unchanged in position. There is mild interstitial prominence bilaterally without convincing pulmonary edema. No gross fractures are identified. No segmental infiltrate. No diagnostic pneumothorax.  IMPRESSION: There is mild interstitial prominence bilaterally without convincing pulmonary edema. No gross fractures are identified. No segmental infiltrate. No diagnostic pneumothorax.   Electronically Signed   By: Natasha Mead   On: 08/05/2013 12:26   Dg Thoracic Spine 2 View  08/05/2013  CLINICAL DATA:  Fall, neck pain, back pain  EXAM: THORACIC SPINE - 2 VIEW  COMPARISON:  Lateral view of the chest 05/27/2013  FINDINGS: Three views of thoracic spine submitted. Alignment is preserved. There is mild compression deformity probable T8 vertebral body. This is new from prior exam. Clinical correlation is necessary. Acute fracture cannot be excluded.  IMPRESSION: There is mild compression fracture superior endplate of T8 vertebral body new from prior exam. Acute fracture cannot be excluded. Clinical correlation is necessary.   Electronically Signed   By: Natasha Mead   On: 08/05/2013 12:30   Ct Head Wo Contrast  08/05/2013   CLINICAL DATA:  Fall, dizziness  EXAM: CT HEAD WITHOUT CONTRAST  CT CERVICAL SPINE WITHOUT CONTRAST  TECHNIQUE: Multidetector CT imaging of the head and cervical spine was performed following  the standard protocol without intravenous contrast. Multiplanar CT image reconstructions of the cervical spine were also generated.  COMPARISON:  04/01/2013  FINDINGS: CT HEAD FINDINGS  No skull fracture is noted. Paranasal sinuses and mastoid air cells are unremarkable.  No intracranial hemorrhage, mass effect or midline shift. No acute cortical infarction. No mass lesion is noted on this unenhanced scan. Stable cerebral atrophy. Stable periventricular and patchy subcortical chronic white matter disease. Ventricular size is stable from prior exam.  CT CERVICAL SPINE FINDINGS  Comparison exam 05/31/2013  Findings:  Axial images of the cervical spine shows no acute fracture or subluxation.  Computer processed images shows no acute fracture or subluxation. Stable degenerative changes C1-C2 articulation. Again noted disc space flattening with mild anterior and minimal posterior spurring at C5-C6 and C6-C7 level. Minimal disc space flattening at C7-T1 level. No prevertebral soft tissue swelling. Cervical airway is patent.  There is no pneumothorax in visualized lung apices. Bilateral apical scarring is noted.  IMPRESSION: CT HEAD IMPRESSION  No acute intracranial abnormality. Stable atrophy and chronic white matter disease.  CT CERVICAL SPINE IMPRESSION  1. No acute fracture or subluxation. Stable degenerative changes as described above.   Electronically Signed   By: Natasha Mead   On: 08/05/2013 12:50   Ct Cervical Spine Wo Contrast  08/05/2013   CLINICAL DATA:  Fall, dizziness  EXAM: CT HEAD WITHOUT CONTRAST  CT CERVICAL SPINE WITHOUT CONTRAST  TECHNIQUE: Multidetector CT imaging of the head and cervical spine was performed following the standard protocol without intravenous contrast. Multiplanar CT image reconstructions of the cervical spine were also generated.  COMPARISON:  04/01/2013  FINDINGS: CT HEAD FINDINGS  No skull fracture is noted. Paranasal sinuses and mastoid air cells are unremarkable.  No intracranial  hemorrhage, mass effect or midline shift. No acute cortical infarction. No mass lesion is noted on this unenhanced scan. Stable cerebral atrophy. Stable periventricular and patchy subcortical chronic white matter disease. Ventricular size is stable from prior exam.  CT CERVICAL SPINE FINDINGS  Comparison exam 05/31/2013  Findings:  Axial images of the cervical spine shows no acute fracture or subluxation.  Computer processed images shows no acute fracture or subluxation. Stable degenerative changes C1-C2 articulation. Again noted disc space flattening with mild anterior and minimal posterior spurring at C5-C6 and C6-C7 level. Minimal disc space flattening at C7-T1 level. No prevertebral soft tissue swelling. Cervical airway is patent.  There is no pneumothorax in visualized lung apices. Bilateral apical scarring is noted.  IMPRESSION: CT HEAD IMPRESSION  No acute intracranial abnormality. Stable atrophy and chronic white matter disease.  CT CERVICAL SPINE IMPRESSION  1. No acute fracture or subluxation. Stable degenerative  changes as described above.   Electronically Signed   By: Natasha Mead   On: 08/05/2013 12:50     Dx:  With cardiomyopathy, presyncope, hypoK, fall, back pain plan for observation on tele. Dr Joseph Art agreed with plan. Updated pt. Multiple pain meds in ED.  Fluids given.   Enid Skeens, MD 08/05/13 1520

## 2013-08-06 DIAGNOSIS — R269 Unspecified abnormalities of gait and mobility: Secondary | ICD-10-CM

## 2013-08-06 DIAGNOSIS — M549 Dorsalgia, unspecified: Secondary | ICD-10-CM | POA: Diagnosis present

## 2013-08-06 DIAGNOSIS — R55 Syncope and collapse: Secondary | ICD-10-CM | POA: Diagnosis present

## 2013-08-06 DIAGNOSIS — J309 Allergic rhinitis, unspecified: Secondary | ICD-10-CM

## 2013-08-06 DIAGNOSIS — J984 Other disorders of lung: Secondary | ICD-10-CM

## 2013-08-06 DIAGNOSIS — I428 Other cardiomyopathies: Secondary | ICD-10-CM

## 2013-08-06 DIAGNOSIS — I379 Nonrheumatic pulmonary valve disorder, unspecified: Secondary | ICD-10-CM

## 2013-08-06 DIAGNOSIS — I4891 Unspecified atrial fibrillation: Secondary | ICD-10-CM

## 2013-08-06 LAB — TROPONIN I
Troponin I: <0.3 ng/mL (ref <0.30)
Troponin I: <0.3 ng/mL (ref <0.30)

## 2013-08-06 LAB — GLUCOSE, CAPILLARY
Glucose-Capillary: 150 mg/dL — ABNORMAL HIGH (ref 70–99)
Glucose-Capillary: 133 mg/dL — ABNORMAL HIGH (ref 70–99)
Glucose-Capillary: 98 mg/dL (ref 70–99)
Glucose-Capillary: 129 mg/dL — ABNORMAL HIGH (ref 70–99)

## 2013-08-06 LAB — BASIC METABOLIC PANEL
BUN: 11 mg/dL (ref 6–23)
CO2: 28 mEq/L (ref 19–32)
Chloride: 98 mEq/L (ref 96–112)
Creatinine, Ser: 0.63 mg/dL (ref 0.50–1.10)
GFR calc Af Amer: >90 mL/min (ref >90)
GFR calc non Af Amer: 86 mL/min — ABNORMAL LOW (ref >90)
Potassium: 3.8 mEq/L (ref 3.5–5.1)
Sodium: 138 mEq/L (ref 135–145)

## 2013-08-06 LAB — TSH: TSH: 1.152 u[IU]/mL (ref 0.350–4.500)

## 2013-08-06 LAB — MAGNESIUM: Magnesium: 1.6 mg/dL (ref 1.5–2.5)

## 2013-08-06 MED ORDER — ACETAMINOPHEN 500 MG PO TABS
500.0000 mg | ORAL_TABLET | Freq: Three times a day (TID) | ORAL | Status: DC
  Administered 2013-08-06 – 2013-08-08 (×5): 500 mg via ORAL
  Filled 2013-08-06 (×12): qty 1

## 2013-08-06 MED ORDER — LIDOCAINE 5 % EX PTCH
1.0000 | MEDICATED_PATCH | CUTANEOUS | Status: DC
  Administered 2013-08-06 – 2013-08-10 (×5): 1 via TRANSDERMAL
  Filled 2013-08-06 (×6): qty 1

## 2013-08-06 MED ORDER — LIDOCAINE 5 % EX PTCH: 1.0000 | MEDICATED_PATCH | CUTANEOUS | Status: DC

## 2013-08-06 NOTE — Care Management Note (Unsigned)
    Page 1 of 1   08/06/2013     5:06:22 PM   CARE MANAGEMENT NOTE 08/06/2013  Patient:  CATRICE, ZULETA   Account Number:  [id number]  Date Initiated:  08/06/2013  Documentation initiated by:  GRAVES-BIGELOW,Madelline Eshbach  Subjective/Objective Assessment:   Pt admitted for Syncope vs  Presyncope; fall. Pt with hx afib.Thoracic spine x-ray shows mild compression fracture of T8 vertebral body.  Pt is from home alone and PT recommends SNF.     Action/Plan:   CSW to discuss disposition needs with pt. CM will continue to monitor.   Anticipated DC Date:  08/09/2013   Anticipated DC Plan:  SKILLED NURSING FACILITY  In-house referral  Clinical Social Worker      DC Planning Services  CM consult      Choice offered to / List presented to:             Status of service:  In process, will continue to follow Medicare Important Message given?   (If response is "NO", the following Medicare IM given date fields will be blank) Date Medicare IM given:   Date Additional Medicare IM given:    Discharge Disposition:    Per UR Regulation:  Reviewed for med. necessity/level of care/duration of stay  If discussed at Long Length of Stay Meetings, dates discussed:    Comments:

## 2013-08-06 NOTE — Progress Notes (Signed)
Nutrition Brief Note  Patient identified on the Malnutrition Screening Tool (MST) Report for recent weight lost without trying and eating poorly because of a decreased appetite.  Per records, patient's weight fluctuates more than likely due to fluid and medical history.  Wt Readings from Last 15 Encounters:  08/06/13 140 lb 10.5 oz (63.8 kg)  06/30/13 138 lb (62.596 kg)  06/16/13 146 lb 12.8 oz (66.588 kg)  06/01/13 146 lb (66.225 kg)  05/24/13 146 lb (66.225 kg)  05/10/13 143 lb 4.8 oz (65 kg)  05/04/13 147 lb 3.2 oz (66.769 kg)  04/23/13 146 lb (66.225 kg)  04/22/13 149 lb (67.586 kg)  02/18/13 145 lb 12.8 oz (66.134 kg)  02/03/13 148 lb (67.132 kg)  12/11/12 151 lb (68.493 kg)  11/24/12 146 lb (66.225 kg)  10/28/12 150 lb 6.4 oz (68.221 kg)  10/21/12 148 lb 6.4 oz (67.314 kg)    Body mass index is 24.13 kg/(m^2). Patient meets criteria for Normal based on current BMI.   Current diet order is Heart Healthy.  Patient reports her appetite is good, however, didn't eat well this AM due to her breakfast being cold.  Labs and medications reviewed.   No nutrition interventions warranted at this time. If nutrition issues arise, please consult RD.   Maureen Chatters, RD, LDN Pager #: [phone] After-Hours Pager #: [phone]

## 2013-08-06 NOTE — Progress Notes (Signed)
UR Completed Aaria Happ Graves-Bigelow, RN,BSN [phone]  

## 2013-08-06 NOTE — Progress Notes (Signed)
  Echocardiogram 2D Echocardiogram has been performed.  EUNICE, Aviella Disbrow 08/06/2013, 10:51 AM

## 2013-08-06 NOTE — Progress Notes (Signed)
Patient ID: [name] [name]  female  ZOX:[id number]    DOB: [birth year]    DOA: 08/05/2013  PCP: Benita Stabile, MD  Assessment/Plan: Principal Problem:  Fall /nearsyncope: Patient does have a history of hypertrophic cardiomyopathy, atrial fibrillation, neuropathy, ? Arrhythmias versus vasovagal, had 3 weeks of diarrhea but stopped 2 days ago prior to admission - 3 sets of cardiac enzymes were negative for ACS - 2-D echocardiogram results pending - Hold sedating medications -No neurological deficits, not orthostatic  Active problems Hypertension: Currently somewhat elevated  Acute Back Pain - Thoracic spine x-ray shows mild compression fracture of T8 vertebral body - Continue pain control, Neurontin, Norco - If no significant improvement, may need MRI to assess for kyphoplasty - Start physical therapy, patient reports that she lives in a  COPD - Currently stable  Diabetes type 2 - Continue sliding scale insulin  DVT Prophylaxis:Lovenox  Code Status:  Disposition:Will assess PT evaluation recommendations    Subjective: Complaining of pain in the upper back, no chest pain or shortness of breath, dizziness or lightheadedness  Objective: Weight change:   Intake/Output Summary (Last 24 hours) at 08/06/13 1329 Last data filed at 08/06/13 0935  Gross per 24 hour  Intake      0 ml  Output    850 ml  Net   -850 ml   Blood pressure 144/69, pulse 84, temperature 98.1 F (36.7 C), temperature source Oral, resp. rate 18, height 5\' 4"  (1.626 m), weight 63.8 kg (140 lb 10.5 oz), SpO2 97.00%.  Physical Exam: General: Alert and awake, oriented x3, not in any acute distress. CVS: S1-S2 clear, no murmur rubs or gallops Chest: clear to auscultation bilaterally, no wheezing, rales or rhonchi Abdomen: soft nontender, nondistended, normal bowel sounds  Extremities: no cyanosis, clubbing or edema noted bilaterally Neuro: Cranial nerves II-XII intact, no focal neurological deficits  Lab  Results: Basic Metabolic Panel:  Recent Labs Lab 08/05/13 2018 08/06/13 1145  NA 138 138  K 3.6 3.8  CL 97 98  CO2 28 28  GLUCOSE 199* 128*  BUN 15 11  CREATININE 0.83 0.63  CALCIUM 9.3 9.2  MG 1.7 1.6   Liver Function Tests:  Recent Labs Lab 08/05/13 2018  AST 45*  ALT 13  ALKPHOS 64  BILITOT 0.8  PROT 7.4  ALBUMIN 4.3   No results found for this basename: LIPASE, AMYLASE,  in the last 168 hours No results found for this basename: AMMONIA,  in the last 168 hours CBC:  Recent Labs Lab 08/05/13 1315 08/05/13 2018  WBC 7.7 7.0  NEUTROABS 6.6 5.5  HGB 14.4 14.0  HCT 40.9 39.8  MCV 96.5 96.4  PLT 157 183   Cardiac Enzymes:  Recent Labs Lab 08/05/13 2039 08/06/13 0010 08/06/13 0500  TROPONINI <0.30 <0.30 <0.30   BNP: No components found with this basename: POCBNP,  CBG:  Recent Labs Lab 08/05/13 2123 08/06/13 0844 08/06/13 1241  GLUCAP 158* 150* 133*     Micro Results: No results found for this or any previous visit (from the past 240 hour(s)).  Studies/Results: Dg Chest 1 View  08/05/2013   CLINICAL DATA:  Fall, neck pain  EXAM: CHEST - 1 VIEW  COMPARISON:  05/27/2013  FINDINGS: Cardiomegaly again noted. Dual lead cardiac pacemaker is unchanged in position. There is mild interstitial prominence bilaterally without convincing pulmonary edema. No gross fractures are identified. No segmental infiltrate. No diagnostic pneumothorax.  IMPRESSION: There is mild interstitial prominence bilaterally without convincing pulmonary edema. No gross  fractures are identified. No segmental infiltrate. No diagnostic pneumothorax.   Electronically Signed   By: Natasha Mead   On: 08/05/2013 12:26   Dg Thoracic Spine 2 View  08/05/2013   CLINICAL DATA:  Fall, neck pain, back pain  EXAM: THORACIC SPINE - 2 VIEW  COMPARISON:  Lateral view of the chest 05/27/2013  FINDINGS: Three views of thoracic spine submitted. Alignment is preserved. There is mild compression deformity  probable T8 vertebral body. This is new from prior exam. Clinical correlation is necessary. Acute fracture cannot be excluded.  IMPRESSION: There is mild compression fracture superior endplate of T8 vertebral body new from prior exam. Acute fracture cannot be excluded. Clinical correlation is necessary.   Electronically Signed   By: Natasha Mead   On: 08/05/2013 12:30   Ct Head Wo Contrast  08/05/2013   CLINICAL DATA:  Fall, dizziness  EXAM: CT HEAD WITHOUT CONTRAST  CT CERVICAL SPINE WITHOUT CONTRAST  TECHNIQUE: Multidetector CT imaging of the head and cervical spine was performed following the standard protocol without intravenous contrast. Multiplanar CT image reconstructions of the cervical spine were also generated.  COMPARISON:  04/01/2013  FINDINGS: CT HEAD FINDINGS  No skull fracture is noted. Paranasal sinuses and mastoid air cells are unremarkable.  No intracranial hemorrhage, mass effect or midline shift. No acute cortical infarction. No mass lesion is noted on this unenhanced scan. Stable cerebral atrophy. Stable periventricular and patchy subcortical chronic white matter disease. Ventricular size is stable from prior exam.  CT CERVICAL SPINE FINDINGS  Comparison exam 05/31/2013  Findings:  Axial images of the cervical spine shows no acute fracture or subluxation.  Computer processed images shows no acute fracture or subluxation. Stable degenerative changes C1-C2 articulation. Again noted disc space flattening with mild anterior and minimal posterior spurring at C5-C6 and C6-C7 level. Minimal disc space flattening at C7-T1 level. No prevertebral soft tissue swelling. Cervical airway is patent.  There is no pneumothorax in visualized lung apices. Bilateral apical scarring is noted.  IMPRESSION: CT HEAD IMPRESSION  No acute intracranial abnormality. Stable atrophy and chronic white matter disease.  CT CERVICAL SPINE IMPRESSION  1. No acute fracture or subluxation. Stable degenerative changes as described  above.   Electronically Signed   By: Natasha Mead   On: 08/05/2013 12:50   Ct Cervical Spine Wo Contrast  08/05/2013   CLINICAL DATA:  Fall, dizziness  EXAM: CT HEAD WITHOUT CONTRAST  CT CERVICAL SPINE WITHOUT CONTRAST  TECHNIQUE: Multidetector CT imaging of the head and cervical spine was performed following the standard protocol without intravenous contrast. Multiplanar CT image reconstructions of the cervical spine were also generated.  COMPARISON:  04/01/2013  FINDINGS: CT HEAD FINDINGS  No skull fracture is noted. Paranasal sinuses and mastoid air cells are unremarkable.  No intracranial hemorrhage, mass effect or midline shift. No acute cortical infarction. No mass lesion is noted on this unenhanced scan. Stable cerebral atrophy. Stable periventricular and patchy subcortical chronic white matter disease. Ventricular size is stable from prior exam.  CT CERVICAL SPINE FINDINGS  Comparison exam 05/31/2013  Findings:  Axial images of the cervical spine shows no acute fracture or subluxation.  Computer processed images shows no acute fracture or subluxation. Stable degenerative changes C1-C2 articulation. Again noted disc space flattening with mild anterior and minimal posterior spurring at C5-C6 and C6-C7 level. Minimal disc space flattening at C7-T1 level. No prevertebral soft tissue swelling. Cervical airway is patent.  There is no pneumothorax in visualized lung apices. Bilateral  apical scarring is noted.  IMPRESSION: CT HEAD IMPRESSION  No acute intracranial abnormality. Stable atrophy and chronic white matter disease.  CT CERVICAL SPINE IMPRESSION  1. No acute fracture or subluxation. Stable degenerative changes as described above.   Electronically Signed   By: Natasha Mead   On: 08/05/2013 12:50    Medications: Scheduled Meds: . acetaminophen  500 mg Oral TID  . aspirin EC  81 mg Oral Daily  . bisoprolol  10 mg Oral Daily  . buPROPion  300 mg Oral Daily  . diltiazem  120 mg Oral BID  . enoxaparin  (LOVENOX) injection  40 mg Subcutaneous Q24H  . furosemide  40 mg Oral Daily  . gabapentin  300 mg Oral TID  . insulin aspart  0-9 Units Subcutaneous TID WC  . lidocaine  1 patch Transdermal Q24H  . losartan  50 mg Oral Daily  . potassium chloride SA  20 mEq Oral BID  . senna  1 tablet Oral BID  . sertraline  200 mg Oral Daily  . simvastatin  10 mg Oral QHS  . sodium chloride  3 mL Intravenous Q12H      LOS: 1 day   Sherine Cortese M.D. [facility] 08/06/2013, 1:29 PM Pager: [phone]  If 7PM-7AM, please contact night-coverage [email or web address] Password [email or web address]

## 2013-08-06 NOTE — Evaluation (Signed)
Physical Therapy Evaluation Patient Details Name: [name] [name] MRN: [id number] DOB: [birth year] Today's Date: 08/06/2013 Time: 4098-1191 PT Time Calculation (min): 22 min  PT Assessment / Plan / Recommendation History of Present Illness  pt presents with Syncope and multiple falls at home.    Clinical Impression  Pt very painful in low back limiting all mobility.  Pt also indicates having dizzy spells like she did when she had to go to Vestibular Rehab.  At this time unable to perform Vestibular eval due to back pain as pt not able to tolerate positioning.  Feel SNF is safest D/C option for pt with f/u for Vestibular Assessment.      PT Assessment  Patient needs continued PT services    Follow Up Recommendations  SNF    Does the patient have the potential to tolerate intense rehabilitation      Barriers to Discharge Decreased caregiver support pt lives alone and only has a grandson who is in school available to A.      Equipment Recommendations  None recommended by PT    Recommendations for Other Services     Frequency Min 2X/week    Precautions / Restrictions Precautions Precautions: Fall Restrictions Weight Bearing Restrictions: No   Pertinent Vitals/Pain 8/10 low back.  RN made aware.        Mobility  Bed Mobility Bed Mobility: Supine to Sit;Sitting - Scoot to Edge of Bed Supine to Sit: 4: Min assist;HOB elevated Sitting - Scoot to Edge of Bed: 5: Supervision Details for Bed Mobility Assistance: A with bringing trunk up to sit.  pt c/o back pain with all bed mobility.   Transfers Transfers: Sit to Stand;Stand to [employer] Transfers Sit to Stand: 4: Min assist;With upper extremity assist;From bed;From chair/3-in-1 Stand to Sit: 4: Min assist;With upper extremity assist;To chair/3-in-1;To bed Stand Pivot Transfers: 4: Min assist;With armrests Details for Transfer Assistance: cues for sequencing and UE use.  pt moves slowly and requires MinA 2/2 back pain.    Ambulation/Gait Ambulation/Gait Assistance: Not tested (comment) Stairs: No Wheelchair Mobility Wheelchair Mobility: No    Exercises     PT Diagnosis: Difficulty walking  PT Problem List: Decreased strength;Decreased activity tolerance;Decreased balance;Decreased mobility;Decreased knowledge of use of DME;Pain PT Treatment Interventions: DME instruction;Gait training;Stair training;Functional mobility training;Therapeutic activities;Therapeutic exercise;Balance training;Patient/family education     PT Goals(Current goals can be found in the care plan section) Acute Rehab PT Goals Patient Stated Goal: Stop falling PT Goal Formulation: With patient Time For Goal Achievement: 08/20/13 Potential to Achieve Goals: Good  Visit Information  Last PT Received On: 08/06/13 Assistance Needed: +1 History of Present Illness: pt presents with Syncope and multiple falls at home.         Prior Functioning  Home Living Family/patient expects to be discharged to:: Skilled nursing facility Living Arrangements: Alone Prior Function Level of Independence: Independent with assistive device(s) Comments: pt uses cane and/or walker PRN Communication Communication: No difficulties Dominant Hand: Right    Cognition  Cognition Arousal/Alertness: Awake/alert Behavior During Therapy: WFL for tasks assessed/performed Overall Cognitive Status: Within Functional Limits for tasks assessed    Extremity/Trunk Assessment Upper Extremity Assessment Upper Extremity Assessment: Generalized weakness Lower Extremity Assessment Lower Extremity Assessment: Generalized weakness Cervical / Trunk Assessment Cervical / Trunk Assessment: Kyphotic   Balance Balance Balance Assessed: No  End of Session PT - End of Session Equipment Utilized During Treatment: Oxygen Activity Tolerance: Patient limited by pain Patient left: in chair;with call bell/phone within reach Nurse  Communication: Mobility status;Patient  requests pain meds  GP     Sunny Schlein, [place] [phone] 08/06/2013, 2:52 PM

## 2013-08-07 ENCOUNTER — Inpatient Hospital Stay (HOSPITAL_COMMUNITY): Payer: Medicare Other

## 2013-08-07 DIAGNOSIS — M549 Dorsalgia, unspecified: Secondary | ICD-10-CM

## 2013-08-07 LAB — GLUCOSE, CAPILLARY
Glucose-Capillary: 145 mg/dL — ABNORMAL HIGH (ref 70–99)
Glucose-Capillary: 123 mg/dL — ABNORMAL HIGH (ref 70–99)
Glucose-Capillary: 113 mg/dL — ABNORMAL HIGH (ref 70–99)
Glucose-Capillary: 120 mg/dL — ABNORMAL HIGH (ref 70–99)

## 2013-08-07 NOTE — Progress Notes (Signed)
Patient ID: [name] [name]  female  NWG:[id number]    DOB: [birth year]    DOA: 08/05/2013  PCP: Benita Stabile, MD  Assessment/Plan: Principal Problem:  Fall /nearsyncope: Patient does have a history of hypertrophic cardiomyopathy, atrial fibrillation, neuropathy, ? Arrhythmias versus vasovagal, had 3 weeks of diarrhea but stopped 2 days ago prior to admission - 3 sets of cardiac enzymes were negative for ACS - 2-D echo showed EF of 60-65%, no aortic stenosis. - No neurological deficits, not orthostatic  Active problems Hypertension: Currently somewhat elevated  Acute Back Pain: Patient still complaining of significant back pain  - Thoracic spine x-ray shows mild compression fracture of T8 vertebral body - Continue pain control, Neurontin, Norco - Unfortunately MRI cannot be done due to pacemaker, discussed with Dr. Jeral Fruit, neurosurgery on call, recommended thoracic myelogram and call back once the results are available   COPD - Currently stable  Diabetes type 2 - Continue sliding scale insulin  DVT Prophylaxis:Lovenox  Code Status:  Disposition: PT eval recommended skilled nursing facility    Subjective:  still complaining of significant pain in the upper back, difficult to sit up   Objective: Weight change: -3.6 kg (-7 lb 15 oz)  Intake/Output Summary (Last 24 hours) at 08/07/13 1005 Last data filed at 08/07/13 0916  Gross per 24 hour  Intake 1569.17 ml  Output   1451 ml  Net 118.17 ml   Blood pressure 130/54, pulse 70, temperature 98 F (36.7 C), temperature source Oral, resp. rate 20, height 5\' 4"  (1.626 m), weight 63.9 kg (140 lb 14 oz), SpO2 98.00%.  Physical Exam: General: A X O, NAD CVS: S1-S2 clear Chest: CTAB Abdomen: soft NT, ND, NBS Extremities: no c/c/e   Lab Results: Basic Metabolic Panel:  Recent Labs Lab 08/05/13 2018 08/06/13 1145  NA 138 138  K 3.6 3.8  CL 97 98  CO2 28 28  GLUCOSE 199* 128*  BUN 15 11  CREATININE 0.83 0.63   CALCIUM 9.3 9.2  MG 1.7 1.6   Liver Function Tests:  Recent Labs Lab 08/05/13 2018  AST 45*  ALT 13  ALKPHOS 64  BILITOT 0.8  PROT 7.4  ALBUMIN 4.3   No results found for this basename: LIPASE, AMYLASE,  in the last 168 hours No results found for this basename: AMMONIA,  in the last 168 hours CBC:  Recent Labs Lab 08/05/13 1315 08/05/13 2018  WBC 7.7 7.0  NEUTROABS 6.6 5.5  HGB 14.4 14.0  HCT 40.9 39.8  MCV 96.5 96.4  PLT 157 183   Cardiac Enzymes:  Recent Labs Lab 08/05/13 2039 08/06/13 0010 08/06/13 0500  TROPONINI <0.30 <0.30 <0.30   BNP: No components found with this basename: POCBNP,  CBG:  Recent Labs Lab 08/06/13 0844 08/06/13 1241 08/06/13 1722 08/06/13 2046 08/07/13 0735  GLUCAP 150* 133* 98 129* 145*     Micro Results: No results found for this or any previous visit (from the past 240 hour(s)).  Studies/Results: Dg Chest 1 View  08/05/2013   CLINICAL DATA:  Fall, neck pain  EXAM: CHEST - 1 VIEW  COMPARISON:  05/27/2013  FINDINGS: Cardiomegaly again noted. Dual lead cardiac pacemaker is unchanged in position. There is mild interstitial prominence bilaterally without convincing pulmonary edema. No gross fractures are identified. No segmental infiltrate. No diagnostic pneumothorax.  IMPRESSION: There is mild interstitial prominence bilaterally without convincing pulmonary edema. No gross fractures are identified. No segmental infiltrate. No diagnostic pneumothorax.   Electronically Signed   By: Lang Snow  Pop   On: 08/05/2013 12:26   Dg Thoracic Spine 2 View  08/05/2013   CLINICAL DATA:  Fall, neck pain, back pain  EXAM: THORACIC SPINE - 2 VIEW  COMPARISON:  Lateral view of the chest 05/27/2013  FINDINGS: Three views of thoracic spine submitted. Alignment is preserved. There is mild compression deformity probable T8 vertebral body. This is new from prior exam. Clinical correlation is necessary. Acute fracture cannot be excluded.  IMPRESSION: There is  mild compression fracture superior endplate of T8 vertebral body new from prior exam. Acute fracture cannot be excluded. Clinical correlation is necessary.   Electronically Signed   By: Natasha Mead   On: 08/05/2013 12:30   Ct Head Wo Contrast  08/05/2013   CLINICAL DATA:  Fall, dizziness  EXAM: CT HEAD WITHOUT CONTRAST  CT CERVICAL SPINE WITHOUT CONTRAST  TECHNIQUE: Multidetector CT imaging of the head and cervical spine was performed following the standard protocol without intravenous contrast. Multiplanar CT image reconstructions of the cervical spine were also generated.  COMPARISON:  04/01/2013  FINDINGS: CT HEAD FINDINGS  No skull fracture is noted. Paranasal sinuses and mastoid air cells are unremarkable.  No intracranial hemorrhage, mass effect or midline shift. No acute cortical infarction. No mass lesion is noted on this unenhanced scan. Stable cerebral atrophy. Stable periventricular and patchy subcortical chronic white matter disease. Ventricular size is stable from prior exam.  CT CERVICAL SPINE FINDINGS  Comparison exam 05/31/2013  Findings:  Axial images of the cervical spine shows no acute fracture or subluxation.  Computer processed images shows no acute fracture or subluxation. Stable degenerative changes C1-C2 articulation. Again noted disc space flattening with mild anterior and minimal posterior spurring at C5-C6 and C6-C7 level. Minimal disc space flattening at C7-T1 level. No prevertebral soft tissue swelling. Cervical airway is patent.  There is no pneumothorax in visualized lung apices. Bilateral apical scarring is noted.  IMPRESSION: CT HEAD IMPRESSION  No acute intracranial abnormality. Stable atrophy and chronic white matter disease.  CT CERVICAL SPINE IMPRESSION  1. No acute fracture or subluxation. Stable degenerative changes as described above.   Electronically Signed   By: Natasha Mead   On: 08/05/2013 12:50   Ct Cervical Spine Wo Contrast  08/05/2013   CLINICAL DATA:  Fall,  dizziness  EXAM: CT HEAD WITHOUT CONTRAST  CT CERVICAL SPINE WITHOUT CONTRAST  TECHNIQUE: Multidetector CT imaging of the head and cervical spine was performed following the standard protocol without intravenous contrast. Multiplanar CT image reconstructions of the cervical spine were also generated.  COMPARISON:  04/01/2013  FINDINGS: CT HEAD FINDINGS  No skull fracture is noted. Paranasal sinuses and mastoid air cells are unremarkable.  No intracranial hemorrhage, mass effect or midline shift. No acute cortical infarction. No mass lesion is noted on this unenhanced scan. Stable cerebral atrophy. Stable periventricular and patchy subcortical chronic white matter disease. Ventricular size is stable from prior exam.  CT CERVICAL SPINE FINDINGS  Comparison exam 05/31/2013  Findings:  Axial images of the cervical spine shows no acute fracture or subluxation.  Computer processed images shows no acute fracture or subluxation. Stable degenerative changes C1-C2 articulation. Again noted disc space flattening with mild anterior and minimal posterior spurring at C5-C6 and C6-C7 level. Minimal disc space flattening at C7-T1 level. No prevertebral soft tissue swelling. Cervical airway is patent.  There is no pneumothorax in visualized lung apices. Bilateral apical scarring is noted.  IMPRESSION: CT HEAD IMPRESSION  No acute intracranial abnormality. Stable atrophy and chronic  white matter disease.  CT CERVICAL SPINE IMPRESSION  1. No acute fracture or subluxation. Stable degenerative changes as described above.   Electronically Signed   By: Natasha Mead   On: 08/05/2013 12:50    Medications: Scheduled Meds: . acetaminophen  500 mg Oral TID  . aspirin EC  81 mg Oral Daily  . bisoprolol  10 mg Oral Daily  . buPROPion  300 mg Oral Daily  . diltiazem  120 mg Oral BID  . enoxaparin (LOVENOX) injection  40 mg Subcutaneous Q24H  . furosemide  40 mg Oral Daily  . gabapentin  300 mg Oral TID  . insulin aspart  0-9 Units  Subcutaneous TID WC  . lidocaine  1 patch Transdermal Q24H  . losartan  50 mg Oral Daily  . potassium chloride SA  20 mEq Oral BID  . senna  1 tablet Oral BID  . sertraline  200 mg Oral Daily  . simvastatin  10 mg Oral QHS  . sodium chloride  3 mL Intravenous Q12H      LOS: 2 days   Azavion Bouillon M.D. [facility] 08/07/2013, 10:05 AM Pager: [phone]  If 7PM-7AM, please contact night-coverage [email or web address] Password [email or web address]

## 2013-08-08 DIAGNOSIS — W19XXXA Unspecified fall, initial encounter: Secondary | ICD-10-CM

## 2013-08-08 DIAGNOSIS — Y92009 Unspecified place in unspecified non-institutional (private) residence as the place of occurrence of the external cause: Secondary | ICD-10-CM

## 2013-08-08 DIAGNOSIS — J449 Chronic obstructive pulmonary disease, unspecified: Secondary | ICD-10-CM

## 2013-08-08 LAB — GLUCOSE, CAPILLARY
Glucose-Capillary: 151 mg/dL — ABNORMAL HIGH (ref 70–99)
Glucose-Capillary: 209 mg/dL — ABNORMAL HIGH (ref 70–99)
Glucose-Capillary: 63 mg/dL — ABNORMAL LOW (ref 70–99)
Glucose-Capillary: 146 mg/dL — ABNORMAL HIGH (ref 70–99)

## 2013-08-08 LAB — BASIC METABOLIC PANEL
CO2: 32 mEq/L (ref 19–32)
Calcium: 9.5 mg/dL (ref 8.4–10.5)
Chloride: 99 mEq/L (ref 96–112)
Creatinine, Ser: 0.73 mg/dL (ref 0.50–1.10)
GFR calc Af Amer: >90 mL/min (ref >90)
GFR calc non Af Amer: 82 mL/min — ABNORMAL LOW (ref >90)
Sodium: 139 mEq/L (ref 135–145)

## 2013-08-08 MED ORDER — FENTANYL 25 MCG/HR TD PT72
25.0000 ug | MEDICATED_PATCH | TRANSDERMAL | Status: DC
  Administered 2013-08-08: 25 ug via TRANSDERMAL
  Filled 2013-08-08: qty 1

## 2013-08-08 NOTE — Progress Notes (Addendum)
Patient ID: [name] [name]  female  WGN:[id number]    DOB: September 21, 1936    DOA: 08/05/2013  PCP: Benita Stabile, MD  Addendum: Received call from Dr Margo Aye, Neuroradiology, did not feel thoracic myelogram is necessary as this is mild compression fracture. Rec'd NM bone scan or CT chest with iv contrast if need to assess need for kyphoplasty. - will cancel myelogram.  - Add low dose fentanyl patch for pain, cont PRN norco, neurontin, PT, OT - if no sig improvement in pain or mobility, will check CT scan/bone scan in AM   RAI,RIPUDEEP M.D. [facility] 08/08/2013, 11:54 AM  Pager: [phone]     Assessment/Plan: Principal Problem:  Fall /nearsyncope: Patient does have a history of hypertrophic cardiomyopathy, atrial fibrillation, neuropathy, ? Arrhythmias versus vasovagal, had 3 weeks of diarrhea but stopped 2 days ago prior to admission - 3 sets of cardiac enzymes were negative for ACS - 2-D echo showed EF of 60-65%, no aortic stenosis. - No neurological deficits, not orthostatic  Active problems Hypertension: Currently somewhat elevated  Acute Back Pain: States she is comfortable and she is not moving - Thoracic spine x-ray shows mild compression fracture of T8 vertebral body - Continue pain control, Neurontin, Norco - MRI cannot be done due to pacemaker, d/w Dr. Jeral Fruit, yesterday, rec'd thoracic myelogram to be done today.   COPD - Currently stable  Diabetes type 2 - Continue sliding scale insulin  DVT Prophylaxis: Lovenox (holding today for myelogram)  Code Status:  Disposition: PT eval recommended skilled nursing facility    Subjective: Complaints of back pain on moving  Objective: Weight change: -0.1 kg (-3.5 oz)  Intake/Output Summary (Last 24 hours) at 08/08/13 1015 Last data filed at 08/08/13 1001  Gross per 24 hour  Intake    603 ml  Output   1652 ml  Net  -1049 ml   Blood pressure 125/53, pulse 64, temperature 98.2 F (36.8 C), temperature source  Oral, resp. rate 18, height 5\' 4"  (1.626 m), weight 63.9 kg (140 lb 14 oz), SpO2 100.00%.  Physical Exam: General: A X O, NAD CVS: S1-S2 clear Chest: CTAB Abdomen: soft NT, ND, NBS Extremities: no c/c/e   Lab Results: Basic Metabolic Panel:  Recent Labs Lab 08/06/13 1145 08/08/13 0650  NA 138 139  K 3.8 3.8  CL 98 99  CO2 28 32  GLUCOSE 128* 150*  BUN 11 15  CREATININE 0.63 0.73  CALCIUM 9.2 9.5  MG 1.6  --    Liver Function Tests:  Recent Labs Lab 08/05/13 2018  AST 45*  ALT 13  ALKPHOS 64  BILITOT 0.8  PROT 7.4  ALBUMIN 4.3   No results found for this basename: LIPASE, AMYLASE,  in the last 168 hours No results found for this basename: AMMONIA,  in the last 168 hours CBC:  Recent Labs Lab 08/05/13 1315 08/05/13 2018  WBC 7.7 7.0  NEUTROABS 6.6 5.5  HGB 14.4 14.0  HCT 40.9 39.8  MCV 96.5 96.4  PLT 157 183   Cardiac Enzymes:  Recent Labs Lab 08/05/13 2039 08/06/13 0010 08/06/13 0500  TROPONINI <0.30 <0.30 <0.30   BNP: No components found with this basename: POCBNP,  CBG:  Recent Labs Lab 08/07/13 0735 08/07/13 1136 08/07/13 1624 08/07/13 2059 08/08/13 0755  GLUCAP 145* 123* 113* 120* 151*     Micro Results: No results found for this or any previous visit (from the past 240 hour(s)).  Studies/Results: Dg Chest 1 View  08/05/2013  CLINICAL DATA:  Fall, neck pain  EXAM: CHEST - 1 VIEW  COMPARISON:  05/27/2013  FINDINGS: Cardiomegaly again noted. Dual lead cardiac pacemaker is unchanged in position. There is mild interstitial prominence bilaterally without convincing pulmonary edema. No gross fractures are identified. No segmental infiltrate. No diagnostic pneumothorax.  IMPRESSION: There is mild interstitial prominence bilaterally without convincing pulmonary edema. No gross fractures are identified. No segmental infiltrate. No diagnostic pneumothorax.   Electronically Signed   By: Natasha Mead   On: 08/05/2013 12:26   Dg Thoracic Spine  2 View  08/05/2013   CLINICAL DATA:  Fall, neck pain, back pain  EXAM: THORACIC SPINE - 2 VIEW  COMPARISON:  Lateral view of the chest 05/27/2013  FINDINGS: Three views of thoracic spine submitted. Alignment is preserved. There is mild compression deformity probable T8 vertebral body. This is new from prior exam. Clinical correlation is necessary. Acute fracture cannot be excluded.  IMPRESSION: There is mild compression fracture superior endplate of T8 vertebral body new from prior exam. Acute fracture cannot be excluded. Clinical correlation is necessary.   Electronically Signed   By: Natasha Mead   On: 08/05/2013 12:30   Ct Head Wo Contrast  08/05/2013   CLINICAL DATA:  Fall, dizziness  EXAM: CT HEAD WITHOUT CONTRAST  CT CERVICAL SPINE WITHOUT CONTRAST  TECHNIQUE: Multidetector CT imaging of the head and cervical spine was performed following the standard protocol without intravenous contrast. Multiplanar CT image reconstructions of the cervical spine were also generated.  COMPARISON:  04/01/2013  FINDINGS: CT HEAD FINDINGS  No skull fracture is noted. Paranasal sinuses and mastoid air cells are unremarkable.  No intracranial hemorrhage, mass effect or midline shift. No acute cortical infarction. No mass lesion is noted on this unenhanced scan. Stable cerebral atrophy. Stable periventricular and patchy subcortical chronic white matter disease. Ventricular size is stable from prior exam.  CT CERVICAL SPINE FINDINGS  Comparison exam 05/31/2013  Findings:  Axial images of the cervical spine shows no acute fracture or subluxation.  Computer processed images shows no acute fracture or subluxation. Stable degenerative changes C1-C2 articulation. Again noted disc space flattening with mild anterior and minimal posterior spurring at C5-C6 and C6-C7 level. Minimal disc space flattening at C7-T1 level. No prevertebral soft tissue swelling. Cervical airway is patent.  There is no pneumothorax in visualized lung apices.  Bilateral apical scarring is noted.  IMPRESSION: CT HEAD IMPRESSION  No acute intracranial abnormality. Stable atrophy and chronic white matter disease.  CT CERVICAL SPINE IMPRESSION  1. No acute fracture or subluxation. Stable degenerative changes as described above.   Electronically Signed   By: Natasha Mead   On: 08/05/2013 12:50   Ct Cervical Spine Wo Contrast  08/05/2013   CLINICAL DATA:  Fall, dizziness  EXAM: CT HEAD WITHOUT CONTRAST  CT CERVICAL SPINE WITHOUT CONTRAST  TECHNIQUE: Multidetector CT imaging of the head and cervical spine was performed following the standard protocol without intravenous contrast. Multiplanar CT image reconstructions of the cervical spine were also generated.  COMPARISON:  04/01/2013  FINDINGS: CT HEAD FINDINGS  No skull fracture is noted. Paranasal sinuses and mastoid air cells are unremarkable.  No intracranial hemorrhage, mass effect or midline shift. No acute cortical infarction. No mass lesion is noted on this unenhanced scan. Stable cerebral atrophy. Stable periventricular and patchy subcortical chronic white matter disease. Ventricular size is stable from prior exam.  CT CERVICAL SPINE FINDINGS  Comparison exam 05/31/2013  Findings:  Axial images of the cervical spine  shows no acute fracture or subluxation.  Computer processed images shows no acute fracture or subluxation. Stable degenerative changes C1-C2 articulation. Again noted disc space flattening with mild anterior and minimal posterior spurring at C5-C6 and C6-C7 level. Minimal disc space flattening at C7-T1 level. No prevertebral soft tissue swelling. Cervical airway is patent.  There is no pneumothorax in visualized lung apices. Bilateral apical scarring is noted.  IMPRESSION: CT HEAD IMPRESSION  No acute intracranial abnormality. Stable atrophy and chronic white matter disease.  CT CERVICAL SPINE IMPRESSION  1. No acute fracture or subluxation. Stable degenerative changes as described above.   Electronically  Signed   By: Natasha Mead   On: 08/05/2013 12:50    Medications: Scheduled Meds: . acetaminophen  500 mg Oral TID  . aspirin EC  81 mg Oral Daily  . bisoprolol  10 mg Oral Daily  . buPROPion  300 mg Oral Daily  . diltiazem  120 mg Oral BID  . enoxaparin (LOVENOX) injection  40 mg Subcutaneous Q24H  . gabapentin  300 mg Oral TID  . insulin aspart  0-9 Units Subcutaneous TID WC  . lidocaine  1 patch Transdermal Q24H  . losartan  50 mg Oral Daily  . potassium chloride SA  20 mEq Oral BID  . senna  1 tablet Oral BID  . sertraline  200 mg Oral Daily  . simvastatin  10 mg Oral QHS  . sodium chloride  3 mL Intravenous Q12H      LOS: 3 days   RAI,RIPUDEEP M.D. [facility] 08/08/2013, 10:15 AM Pager: [phone]  If 7PM-7AM, please contact night-coverage [email or web address] Password [email or web address]

## 2013-08-08 NOTE — Progress Notes (Addendum)
Clinical Social Work Department CLINICAL SOCIAL WORK PLACEMENT NOTE 08/08/2013  Patient:  [name] [name], [name] [name]  Account Number:  [id number] Admit date:  08/05/2013  Clinical Social Worker:  Jacelyn Grip  Date/time:  08/08/2013 03:00 PM  Clinical Social Work is seeking post-discharge placement for this patient at the following level of care:   SKILLED NURSING   (*CSW will update this form in Epic as items are completed)   08/08/2013  Patient/family provided with [facility] Department of Clinical Social Work's list of facilities offering this level of care within the geographic area requested by the patient (or if unable, by the patient's family).  08/08/2013  Patient/family informed of their freedom to choose among providers that offer the needed level of care, that participate in Medicare, Medicaid or managed care program needed by the patient, have an available bed and are willing to accept the patient.  08/08/2013  Patient/family informed of [facility]' ownership interest in [facility], as well as of the fact that they are under no obligation to receive care at this facility.  PASARR submitted to EDS on 08/08/2013 PASARR number received from EDS on 08/08/2013  FL2 transmitted to all facilities in geographic area requested by pt/family on  08/08/2013 FL2 transmitted to all facilities within larger geographic area on   Patient informed that his/her managed care company has contracts with or will negotiate with  certain facilities, including the following:     Patient/family informed of bed offers received:  08/09/2013 Patient chooses bed at [facility] Physician recommends and patient chooses bed at    Patient to be transferred to [facility]  On 08/10/2013   Patient to be transferred to facility by [facility]  The following physician request were entered in Epic:   Additional Comments:    Jacklynn Lewis, MSW, [facility]  Clinical Social Work Weekend coverage [phone]

## 2013-08-08 NOTE — Progress Notes (Signed)
Clinical Social Work Department BRIEF PSYCHOSOCIAL ASSESSMENT 08/08/2013  Patient:  [name] [name], [name] [name]     Account Number:  [id number]     Admit date:  08/05/2013  Clinical Social Worker:  Jacelyn Grip  Date/Time:  08/08/2013 02:30 PM  Referred by:  Physician  Date Referred:  08/08/2013 Referred for  SNF Placement   Other Referral:   Interview type:  Patient Other interview type:    PSYCHOSOCIAL DATA Living Status:  ALONE Admitted from facility:   Level of care:   Primary support name:  Clydie Braun Brown/daughter/[phone] Primary support relationship to patient:  CHILD, ADULT Degree of support available:   adequate    CURRENT CONCERNS Current Concerns  Post-Acute Placement   Other Concerns:    SOCIAL WORK ASSESSMENT / PLAN CSW received referral for New SNF placement.    CSW met with pt at bedside to discuss.    CSW introduced self and explained role. CSW discussed PT recommendation for rehab at [facility]. Pt agreeable to initiation of SNF search in [place] and [place] [facility] [place]. Pt expressed interest in [facility].    CSW completed FL2 and initiated SNF search to [facility] and [facility].    Weekday CSW to follow up with SNF bed offers.    Weekday CSW to continue to follow and facilitate pt discharge needs when pt medically ready for discharge.   Assessment/plan status:  Psychosocial Support/Ongoing Assessment of Needs Other assessment/ plan:   discharge planning   Information/referral to community resources:   [facility] and [facility] list.    PATIENT'S/FAMILY'S RESPONSE TO PLAN OF CARE: Pt alert and oriented x 4. Pt reports being in rehab facility in that past and expresses interest in [facility].     Jacklynn Lewis, MSW, [employer]  Clinical Social Work Weekend coverage [phone]

## 2013-08-09 ENCOUNTER — Inpatient Hospital Stay (HOSPITAL_COMMUNITY): Payer: Medicare Other

## 2013-08-09 LAB — GLUCOSE, CAPILLARY
Glucose-Capillary: 160 mg/dL — ABNORMAL HIGH (ref 70–99)
Glucose-Capillary: 81 mg/dL (ref 70–99)
Glucose-Capillary: 116 mg/dL — ABNORMAL HIGH (ref 70–99)

## 2013-08-09 LAB — BASIC METABOLIC PANEL
BUN: 15 mg/dL (ref 6–23)
CO2: 27 mEq/L (ref 19–32)
Creatinine, Ser: 0.63 mg/dL (ref 0.50–1.10)
GFR calc Af Amer: >90 mL/min (ref >90)
GFR calc non Af Amer: 86 mL/min — ABNORMAL LOW (ref >90)
Potassium: 3.8 mEq/L (ref 3.5–5.1)
Sodium: 138 mEq/L (ref 135–145)

## 2013-08-09 MED ORDER — METHOCARBAMOL 100 MG/ML IJ SOLN
500.0000 mg | Freq: Three times a day (TID) | INTRAVENOUS | Status: DC
  Filled 2013-08-09 (×3): qty 5

## 2013-08-09 MED ORDER — METHOCARBAMOL 500 MG PO TABS
500.0000 mg | ORAL_TABLET | Freq: Three times a day (TID) | ORAL | Status: DC
  Administered 2013-08-09 – 2013-08-10 (×2): 500 mg via ORAL
  Filled 2013-08-09 (×5): qty 1

## 2013-08-09 NOTE — Progress Notes (Signed)
Patient ID: RAYLENE CARMICKLE  female  MWN:[id number]    DOB: Nov 10, 1936    DOA: 08/05/2013  PCP: Benita Stabile, MD  Assessment/Plan: Principal Problem:  Fall /nearsyncope: Patient does have a history of hypertrophic cardiomyopathy, atrial fibrillation, neuropathy, ? Arrhythmias versus vasovagal, had 3 weeks of diarrhea but stopped 2 days ago prior to admission - 3 sets of cardiac enzymes were negative for ACS - 2-D echo showed EF of 60-65%, no aortic stenosis. - No neurological deficits, not orthostatic  Active problems Hypertension: Currently somewhat elevated  Acute Back Pain: Patient still complaining of back pain , however is somewhat improved after starting fentanyl patch - Thoracic spine x-ray shows mild compression fracture of T8 vertebral body - Continue pain control, Neurontin, Norco, fentanyl patch - Unfortunately MRI cannot be done due to pacemaker, check CT of the thoracic spine to rule out any retropulsion or notable compression, if need for vertebroplasty  COPD - Currently stable  Diabetes type 2 - Continue sliding scale insulin  DVT Prophylaxis:Lovenox  Code Status:  Disposition: PT eval recommended skilled nursing facility    Subjective: Patient seen twice today, initially she was sitting up in the chair, washing herself, which was a significant improvement from last 2 days. Later on examination patient complained of back pain again.  Objective: Weight change: -1.9 kg (-4 lb 3 oz)  Intake/Output Summary (Last 24 hours) at 08/09/13 1312 Last data filed at 08/09/13 0500  Gross per 24 hour  Intake    320 ml  Output    250 ml  Net     70 ml   Blood pressure 120/61, pulse 67, temperature 97.7 F (36.5 C), temperature source Oral, resp. rate 16, height 5\' 4"  (1.626 m), weight 62 kg (136 lb 11 oz), SpO2 97.00%.  Physical Exam: General: A X O, NAD CVS: S1-S2 clear Chest: CTAB Abdomen: soft NT, ND, NBS Extremities: no c/c/e   Lab Results: Basic Metabolic  Panel:  Recent Labs Lab 08/06/13 1145 08/08/13 0650 08/09/13 0423  NA 138 139 138  K 3.8 3.8 3.8  CL 98 99 98  CO2 28 32 27  GLUCOSE 128* 150* 142*  BUN 11 15 15   CREATININE 0.63 0.73 0.63  CALCIUM 9.2 9.5 9.5  MG 1.6  --   --    Liver Function Tests:  Recent Labs Lab 08/05/13 2018  AST 45*  ALT 13  ALKPHOS 64  BILITOT 0.8  PROT 7.4  ALBUMIN 4.3   No results found for this basename: LIPASE, AMYLASE,  in the last 168 hours No results found for this basename: AMMONIA,  in the last 168 hours CBC:  Recent Labs Lab 08/05/13 1315 08/05/13 2018  WBC 7.7 7.0  NEUTROABS 6.6 5.5  HGB 14.4 14.0  HCT 40.9 39.8  MCV 96.5 96.4  PLT 157 183   Cardiac Enzymes:  Recent Labs Lab 08/05/13 2039 08/06/13 0010 08/06/13 0500  TROPONINI <0.30 <0.30 <0.30   BNP: No components found with this basename: POCBNP,  CBG:  Recent Labs Lab 08/08/13 1635 08/08/13 1750 08/08/13 2110 08/09/13 0809 08/09/13 1204  GLUCAP 63* 146* 94 160* 81     Micro Results: No results found for this or any previous visit (from the past 240 hour(s)).  Studies/Results: Dg Chest 1 View  08/05/2013   CLINICAL DATA:  Fall, neck pain  EXAM: CHEST - 1 VIEW  COMPARISON:  05/27/2013  FINDINGS: Cardiomegaly again noted. Dual lead cardiac pacemaker is unchanged in position. There is mild interstitial  prominence bilaterally without convincing pulmonary edema. No gross fractures are identified. No segmental infiltrate. No diagnostic pneumothorax.  IMPRESSION: There is mild interstitial prominence bilaterally without convincing pulmonary edema. No gross fractures are identified. No segmental infiltrate. No diagnostic pneumothorax.   Electronically Signed   By: Natasha Mead   On: 08/05/2013 12:26   Dg Thoracic Spine 2 View  08/05/2013   CLINICAL DATA:  Fall, neck pain, back pain  EXAM: THORACIC SPINE - 2 VIEW  COMPARISON:  Lateral view of the chest 05/27/2013  FINDINGS: Three views of thoracic spine submitted.  Alignment is preserved. There is mild compression deformity probable T8 vertebral body. This is new from prior exam. Clinical correlation is necessary. Acute fracture cannot be excluded.  IMPRESSION: There is mild compression fracture superior endplate of T8 vertebral body new from prior exam. Acute fracture cannot be excluded. Clinical correlation is necessary.   Electronically Signed   By: Natasha Mead   On: 08/05/2013 12:30   Ct Head Wo Contrast  08/05/2013   CLINICAL DATA:  Fall, dizziness  EXAM: CT HEAD WITHOUT CONTRAST  CT CERVICAL SPINE WITHOUT CONTRAST  TECHNIQUE: Multidetector CT imaging of the head and cervical spine was performed following the standard protocol without intravenous contrast. Multiplanar CT image reconstructions of the cervical spine were also generated.  COMPARISON:  04/01/2013  FINDINGS: CT HEAD FINDINGS  No skull fracture is noted. Paranasal sinuses and mastoid air cells are unremarkable.  No intracranial hemorrhage, mass effect or midline shift. No acute cortical infarction. No mass lesion is noted on this unenhanced scan. Stable cerebral atrophy. Stable periventricular and patchy subcortical chronic white matter disease. Ventricular size is stable from prior exam.  CT CERVICAL SPINE FINDINGS  Comparison exam 05/31/2013  Findings:  Axial images of the cervical spine shows no acute fracture or subluxation.  Computer processed images shows no acute fracture or subluxation. Stable degenerative changes C1-C2 articulation. Again noted disc space flattening with mild anterior and minimal posterior spurring at C5-C6 and C6-C7 level. Minimal disc space flattening at C7-T1 level. No prevertebral soft tissue swelling. Cervical airway is patent.  There is no pneumothorax in visualized lung apices. Bilateral apical scarring is noted.  IMPRESSION: CT HEAD IMPRESSION  No acute intracranial abnormality. Stable atrophy and chronic white matter disease.  CT CERVICAL SPINE IMPRESSION  1. No acute  fracture or subluxation. Stable degenerative changes as described above.   Electronically Signed   By: Natasha Mead   On: 08/05/2013 12:50   Ct Cervical Spine Wo Contrast  08/05/2013   CLINICAL DATA:  Fall, dizziness  EXAM: CT HEAD WITHOUT CONTRAST  CT CERVICAL SPINE WITHOUT CONTRAST  TECHNIQUE: Multidetector CT imaging of the head and cervical spine was performed following the standard protocol without intravenous contrast. Multiplanar CT image reconstructions of the cervical spine were also generated.  COMPARISON:  04/01/2013  FINDINGS: CT HEAD FINDINGS  No skull fracture is noted. Paranasal sinuses and mastoid air cells are unremarkable.  No intracranial hemorrhage, mass effect or midline shift. No acute cortical infarction. No mass lesion is noted on this unenhanced scan. Stable cerebral atrophy. Stable periventricular and patchy subcortical chronic white matter disease. Ventricular size is stable from prior exam.  CT CERVICAL SPINE FINDINGS  Comparison exam 05/31/2013  Findings:  Axial images of the cervical spine shows no acute fracture or subluxation.  Computer processed images shows no acute fracture or subluxation. Stable degenerative changes C1-C2 articulation. Again noted disc space flattening with mild anterior and minimal posterior spurring  at C5-C6 and C6-C7 level. Minimal disc space flattening at C7-T1 level. No prevertebral soft tissue swelling. Cervical airway is patent.  There is no pneumothorax in visualized lung apices. Bilateral apical scarring is noted.  IMPRESSION: CT HEAD IMPRESSION  No acute intracranial abnormality. Stable atrophy and chronic white matter disease.  CT CERVICAL SPINE IMPRESSION  1. No acute fracture or subluxation. Stable degenerative changes as described above.   Electronically Signed   By: Natasha Mead   On: 08/05/2013 12:50    Medications: Scheduled Meds: . aspirin EC  81 mg Oral Daily  . bisoprolol  10 mg Oral Daily  . buPROPion  300 mg Oral Daily  . diltiazem   120 mg Oral BID  . enoxaparin (LOVENOX) injection  40 mg Subcutaneous Q24H  . fentaNYL  25 mcg Transdermal Q72H  . gabapentin  300 mg Oral TID  . insulin aspart  0-9 Units Subcutaneous TID WC  . lidocaine  1 patch Transdermal Q24H  . losartan  50 mg Oral Daily  . methocarbamol (ROBAXIN) IV  500 mg Intravenous Q8H  . potassium chloride SA  20 mEq Oral BID  . senna  1 tablet Oral BID  . sertraline  200 mg Oral Daily  . simvastatin  10 mg Oral QHS  . sodium chloride  3 mL Intravenous Q12H      LOS: 4 days   RAI,RIPUDEEP M.D. [facility] 08/09/2013, 1:12 PM Pager: [phone]  If 7PM-7AM, please contact night-coverage [email or web address] Password [email or web address]

## 2013-08-09 NOTE — Progress Notes (Signed)
Patient is currently active with [facility] Care Management for chronic disease management services.  Patient has been engaged by a [employer] and LCSW.  Our community based plan of care has focused on depression management, fall risk reduction, and ALF placement.  She has two daughters that are active with her care, but she does not want to burden them and therefore limits their engagement.  Talked with Mable Fill this am regarding the patients desire to go to ALF.  Also patient could benefit from vestibular rehabilitation follow up at the SNF.  An ongoing follow up by home health post SNF would also be beneficial to support her transition to ALF.  Patient will receive a post discharge transition of care call and will be evaluated for monthly home visits for assessments and disease process education.  Made inpatient Case Manager aware that [facility] Care Management following. Of note, [facility] Care Management services does not replace or interfere with any services that are arranged by inpatient case management or social work.  For additional questions or referrals please contact Anibal Henderson BSN RN [facility] Liaison at [phone].

## 2013-08-09 NOTE — Progress Notes (Signed)
Patient is currently active with [facility] Care Management for chronic disease management services.  Patient has been engaged by a [employer].  Patient will receive a post discharge transition of care call and will be evaluated for monthly home visits for assessments and disease process education post SNF discharge.  Made inpatient Case Manager and LCSW aware that [facility] Care Management following. Of note, [facility] Care Management services does not replace or interfere with any services that are arranged by inpatient case management or social work.  For additional questions or referrals please contact Anibal Henderson BSN RN [facility] Liaison at [phone].

## 2013-08-09 NOTE — Progress Notes (Signed)
CSW (Clinical Child psychotherapist) presented pt with bed offers. Pt stated she has been to [facility] in the past and would not mind going back so she has accepted their bed offer. CSW left message with facility to notify.  Jaye Saal, LCSWA [phone]

## 2013-08-10 ENCOUNTER — Encounter (HOSPITAL_COMMUNITY): Payer: Medicare Other

## 2013-08-10 LAB — GLUCOSE, CAPILLARY
Glucose-Capillary: 148 mg/dL — ABNORMAL HIGH (ref 70–99)
Glucose-Capillary: 103 mg/dL — ABNORMAL HIGH (ref 70–99)

## 2013-08-10 LAB — BASIC METABOLIC PANEL WITH GFR
BUN: 17 mg/dL (ref 6–23)
CO2: 19 meq/L (ref 19–32)
Calcium: 9.6 mg/dL (ref 8.4–10.5)
Chloride: 99 meq/L (ref 96–112)
Creatinine, Ser: 0.71 mg/dL (ref 0.50–1.10)
GFR calc Af Amer: >90 mL/min
GFR calc non Af Amer: 82 mL/min — ABNORMAL LOW
Glucose, Bld: 133 mg/dL — ABNORMAL HIGH (ref 70–99)
Potassium: 4.9 meq/L (ref 3.5–5.1)
Sodium: 135 meq/L (ref 135–145)

## 2013-08-10 IMAGING — CR DG CHEST 2V
2 series · 2 of 2 positions shown · non-contrast
Comparison: Two-view chest 12/21/2011.

CLINICAL DATA: Increasing shortness of breath.  History of
congestive heart failure.

CHEST - 2 VIEW

[view not recorded (1 of 2)]
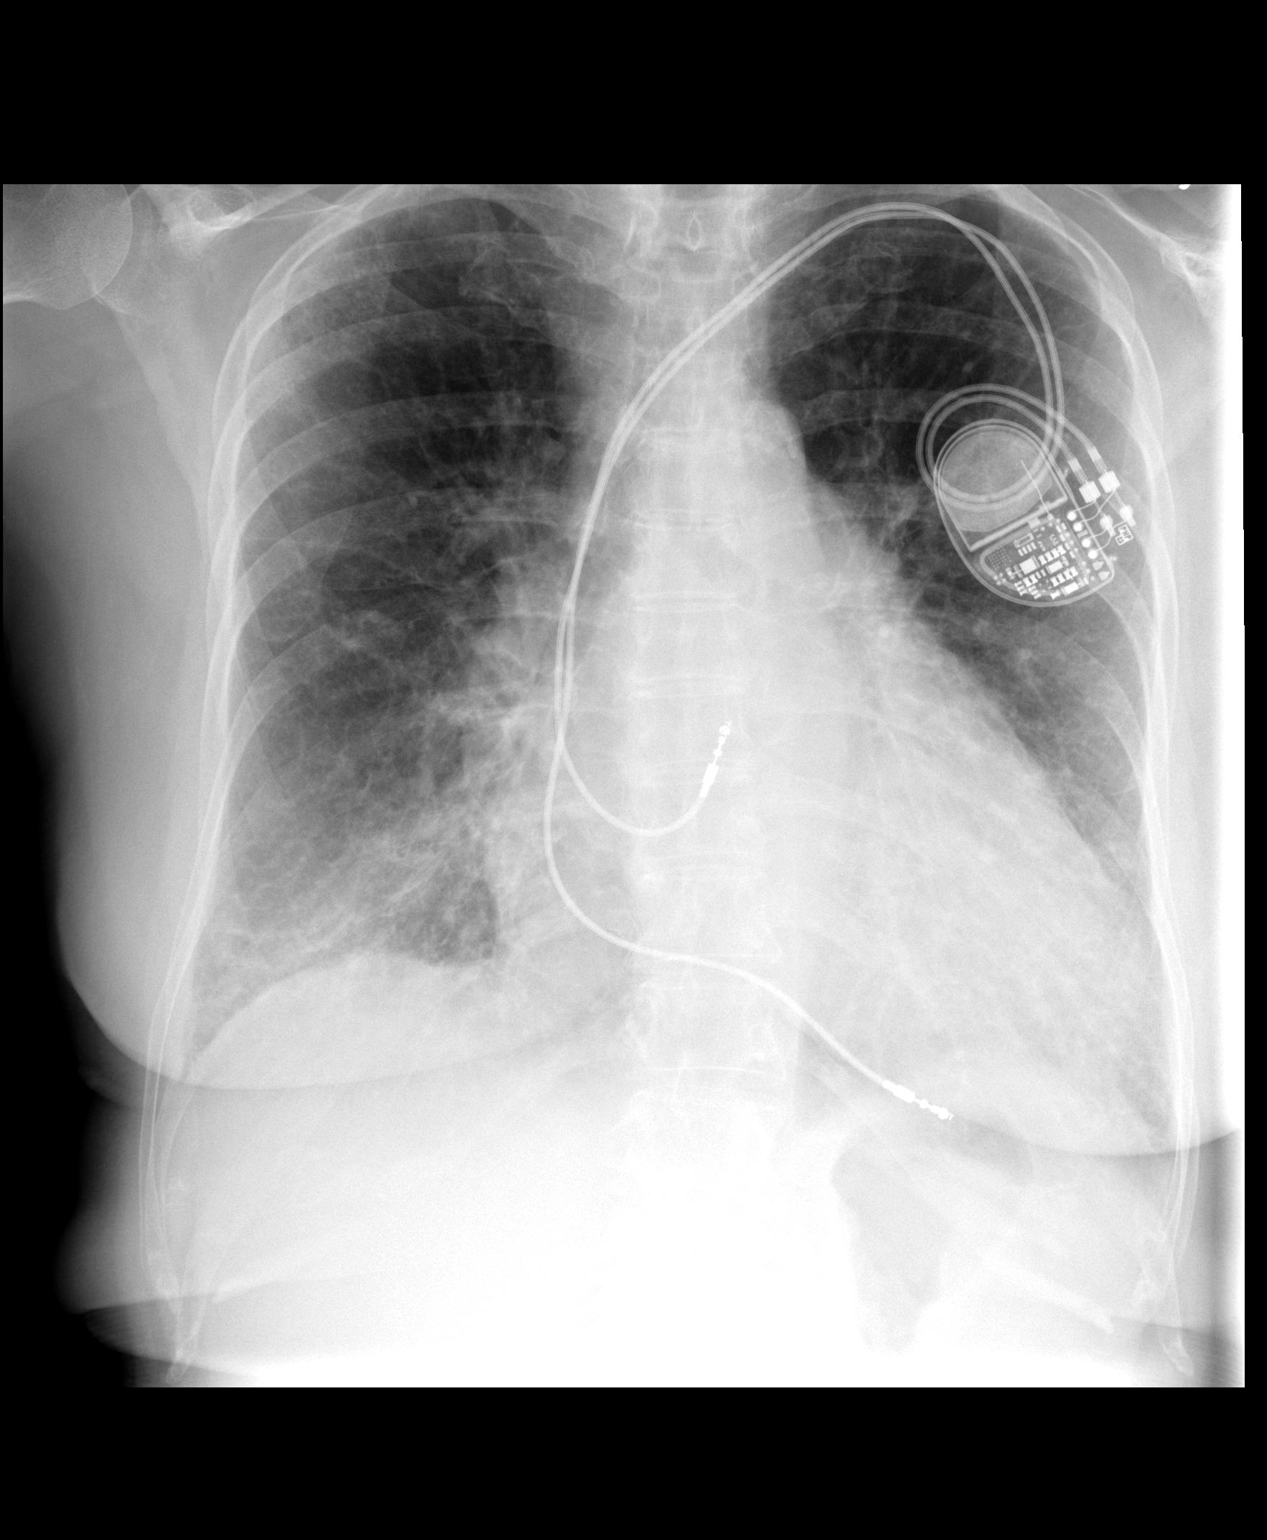

[view not recorded (2 of 2)]
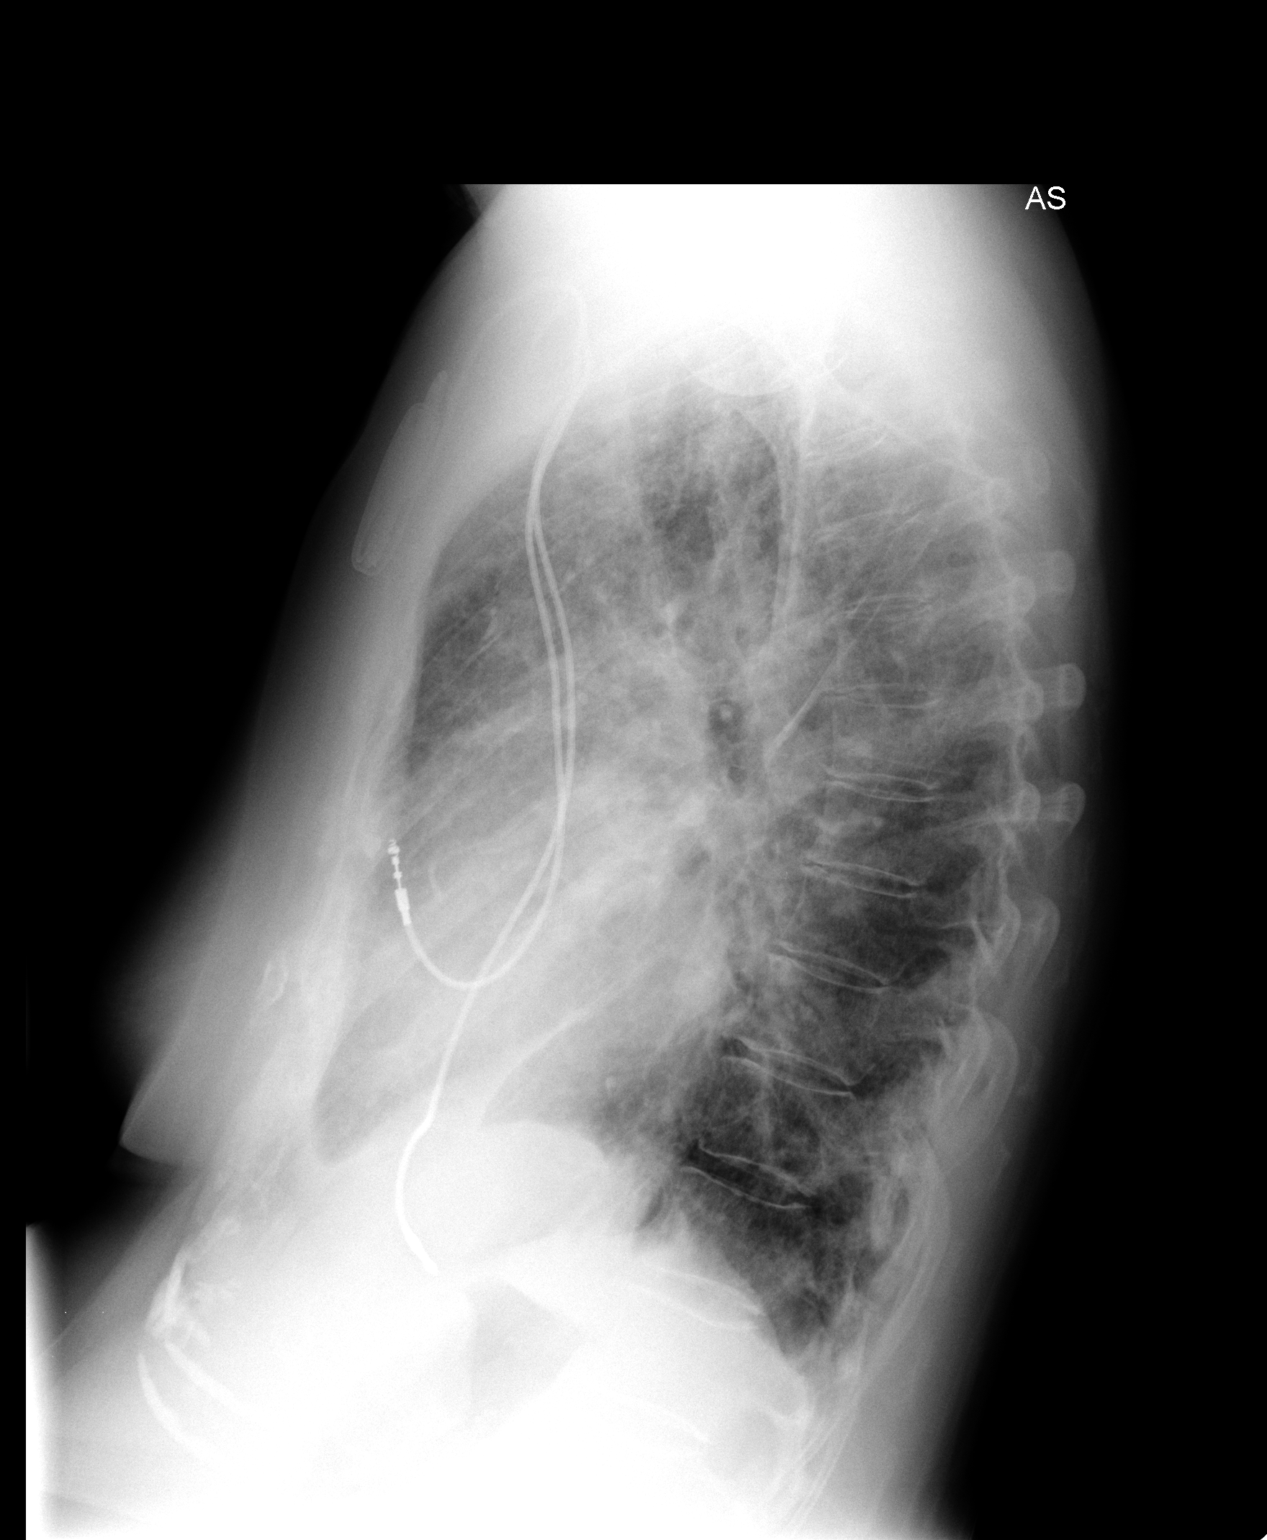

[2 of 2 positions shown; findings below may reference images not displayed]

FINDINGS: The heart is enlarged.  Pacing wires are in place.  A
mild interstitial pattern has increased since the prior exam.  Mild
bibasilar atelectasis is noted as well.  Tiny bilateral pleural
effusions are not excluded.
IMPRESSION: 1.  Cardiomegaly with slight increase in a diffuse interstitial
pattern suggesting edema and early congestive heart failure.
2.  Question small bilateral pleural effusions.
3.  Mild bibasilar atelectasis.

## 2013-08-10 MED ORDER — HYDROCODONE-ACETAMINOPHEN 7.5-325 MG PO TABS: 1.0000 | ORAL_TABLET | Freq: Four times a day (QID) | ORAL | Status: DC | PRN

## 2013-08-10 MED ORDER — LIDOCAINE 5 % EX PTCH: 1.0000 | MEDICATED_PATCH | CUTANEOUS | Status: DC

## 2013-08-10 MED ORDER — FUROSEMIDE 40 MG PO TABS: 40.0000 mg | ORAL_TABLET | Freq: Every day | ORAL | Status: DC | PRN

## 2013-08-10 MED ORDER — SENNA 8.6 MG PO TABS: 1.0000 | ORAL_TABLET | Freq: Two times a day (BID) | ORAL | Status: DC

## 2013-08-10 MED ORDER — BISACODYL 10 MG RE SUPP: 10.0000 mg | RECTAL | Status: DC | PRN

## 2013-08-10 MED ORDER — METHOCARBAMOL 500 MG PO TABS: 500.0000 mg | ORAL_TABLET | Freq: Three times a day (TID) | ORAL | Status: DC

## 2013-08-10 MED ORDER — ASPIRIN 81 MG PO TBEC: 81.0000 mg | DELAYED_RELEASE_TABLET | Freq: Every day | ORAL | Status: DC

## 2013-08-10 MED ORDER — ACETAMINOPHEN 325 MG PO TABS: 650.0000 mg | ORAL_TABLET | Freq: Four times a day (QID) | ORAL | Status: DC | PRN

## 2013-08-10 MED ORDER — TEMAZEPAM 7.5 MG PO CAPS: 7.5000 mg | ORAL_CAPSULE | Freq: Every evening | ORAL | Status: DC | PRN

## 2013-08-10 MED ORDER — FENTANYL 25 MCG/HR TD PT72: 1.0000 | MEDICATED_PATCH | TRANSDERMAL | Status: DC

## 2013-08-10 NOTE — Progress Notes (Signed)
CSW (Clinical Child psychotherapist) confirmed with facility that pt is able to dc there today. Facility representative will come to hospital to complete paperwork with pt. CSW will help facilitate dc after all facility paperwork complete. Pt and pt nurse aware.  Kavin Weckwerth, LCSWA [phone]

## 2013-08-10 NOTE — Progress Notes (Signed)
CSW (Clinical Child psychotherapist) prepared pt dc packet and placed with pt shadow chart. Transportation to facility has been arranged. CSW notified pt, facility, and nurse. CSW signing off.  Shyloh Krinke, LCSWA [phone]

## 2013-08-10 NOTE — Discharge Summary (Signed)
Physician Discharge Summary  Patient ID: [name] [name] MRN: [id number] DOB/AGE: 12/26/1935 77 y.o.  Admit date: 08/05/2013 Discharge date: 08/10/2013  Primary Care Physician:  Benita Stabile, MD  Discharge Diagnoses:   . Near syncope . Type II diabetes mellitus  . HYPERTENSION . COPD (chronic obstructive pulmonary disease) . Back pain . mild T8 compression fracture  Consults: None   Recommendations for Outpatient Follow-up:  1. patient is started on fentanyl patch 25 MCG daily, first patch placed on 9/28.  2. avoid constipation 3. fall precautions 4. please note that Lasix is currently held, patient has appointment with her primary cardiologist on 08/18/2013. Will defer to Dr. Patty Sermons for restarting Lasix or changing dose.  Allergies:   Allergies  Allergen Reactions  . Calan [Verapamil Hcl] Other (See Comments)    Patient states it makes her "out of her mind"; bp bottoms out  . Crestor [Rosuvastatin Calcium] Other (See Comments)    Muscle pain  . Escitalopram Oxalate Other (See Comments)    Unknown   . Lipitor [Atorvastatin Calcium] Other (See Comments)    myalgia  . Lopressor [Metoprolol Tartrate] Other (See Comments)    Blood pressure bottoms out   . Norvasc [Amlodipine Besylate] Other (See Comments)    fatigue  . Nsaids Nausea Only  . Prednisone     SWELLING  . Sulfa Antibiotics Nausea Only and Other (See Comments)    Makes her stomach hurt  . Vimovo [Naproxen-Esomeprazole] Nausea Only  . Penicillins Hives and Rash     Discharge Medications:   Medication List    STOP taking these medications       LORazepam 1 MG tablet  Commonly known as:  ATIVAN      TAKE these medications       acetaminophen 325 MG tablet  Commonly known as:  TYLENOL  Take 2 tablets (650 mg total) by mouth every 6 (six) hours as needed.     aluminum & magnesium hydroxide-simethicone 500-450-40 MG/5ML suspension  Commonly known as:  MYLANTA  Take 10 mLs by mouth every 6  (six) hours as needed for indigestion.     aspirin 81 MG EC tablet  Take 1 tablet (81 mg total) by mouth daily.     bisacodyl 10 MG suppository  Commonly known as:  DULCOLAX  Place 1 suppository (10 mg total) rectally as needed for constipation.     bisoprolol 10 MG tablet  Commonly known as:  ZEBETA  Take 10 mg by mouth daily.     budesonide-formoterol 160-4.5 MCG/ACT inhaler  Commonly known as:  SYMBICORT  Inhale 2 puffs into the lungs 2 (two) times daily as needed (sortness of breath).     buPROPion 300 MG 24 hr tablet  Commonly known as:  WELLBUTRIN XL  Take 300 mg by mouth daily.     conjugated estrogens vaginal cream  Commonly known as:  PREMARIN  Place 0.625 g vaginally daily as needed. For vaginal dryness     diltiazem 120 MG 24 hr capsule  Commonly known as:  CARDIZEM CD  Take 120 mg by mouth 2 (two) times daily.     famotidine 20 MG tablet  Commonly known as:  PEPCID  Take 20 mg by mouth 2 (two) times daily as needed. For GERD     fentaNYL 25 MCG/HR patch  Commonly known as:  DURAGESIC - dosed mcg/hr  Place 1 patch (25 mcg total) onto the skin every 3 (three) days.     fluticasone 50 MCG/ACT  nasal spray  Commonly known as:  FLONASE  Place 2 sprays into both nostrils as needed for rhinitis or allergies.     furosemide 40 MG tablet  Commonly known as:  LASIX  Take 1 tablet (40 mg total) by mouth daily as needed for fluid or edema.     gabapentin 300 MG capsule  Commonly known as:  NEURONTIN  Take 300 mg by mouth 3 (three) times daily.     HYDROcodone-acetaminophen 7.5-325 MG per tablet  Commonly known as:  NORCO  Take 1 tablet by mouth every 6 (six) hours as needed for pain.     lidocaine 5 %  Commonly known as:  LIDODERM  Place 1 patch onto the skin daily. Remove & Discard patch within 12 hours or as directed by MD     losartan 50 MG tablet  Commonly known as:  COZAAR  Take 1 tablet (50 mg total) by mouth daily.     metFORMIN 500 MG tablet   Commonly known as:  GLUCOPHAGE  Take 500 mg by mouth 2 (two) times daily.     methocarbamol 500 MG tablet  Commonly known as:  ROBAXIN  Take 1 tablet (500 mg total) by mouth 3 (three) times daily.     OXYGEN-HELIUM IN  Inhale into the lungs as directed.     potassium chloride SA 20 MEQ tablet  Commonly known as:  K-DUR,KLOR-CON  Take 20 mEq by mouth 2 (two) times daily.     PROAIR HFA 108 (90 BASE) MCG/ACT inhaler  Generic drug:  albuterol  Inhale 2 puffs into the lungs every 6 (six) hours as needed. For shortness of breath/wheezing     senna 8.6 MG Tabs tablet  Commonly known as:  SENOKOT  Take 1 tablet (8.6 mg total) by mouth 2 (two) times daily.     sertraline 100 MG tablet  Commonly known as:  ZOLOFT  Take 200 mg by mouth daily.     simvastatin 10 MG tablet  Commonly known as:  ZOCOR  Take 10 mg by mouth at bedtime.     temazepam 7.5 MG capsule  Commonly known as:  RESTORIL  Take 1 capsule (7.5 mg total) by mouth at bedtime as needed for sleep or anxiety. Insomnia         Brief H and P: For complete details please refer to admission H and P, but in brief [name] [name] 77 yo WF PMHx depression, COPD, HTN, hypertrophic cardiomyopathy, A-Fib with pacemaker, O2 dependent, Pulm HTN, DM type II with neuropathy presented with neck pain and fall at home. According to the admission history, patient was standing and about to use bathroom and she felt lightheaded then tripped on the edge of the shower causing her to hit her back and head. Patient also states that she has been having multiple episodes of falling this week (family members state at least 6 times her she sought no medical attention). States she had 3 weeks of diarrhea where she has been seeing her PCP for treatment. Stated diarrhea stopped 2 days prior to admission.. No loc, cp or sob.  patient complained of pain in the upper back. She also admitted to have decreased by mouth intake recently. She also stated that she had  not been using compression stockings or taking her pulmonary inhalers.   She has a history of chronic atrial fibrillation, and on pacemaker, not on any anticoagulation due to history of multiple falls.  Hospital Course:  Principal Problem:  Near syncope with fall- Likely secondary to dehydration, patient had diarrhea for 3 weeks prior to admission. During hospitalization that he had completely resolved. Lasix was held during the hospitalization and changed to as needed for now. She needs to see her primary cardiologist, Dr. Patty Sermons, appointment on 08/18/13 prior to starting Lasix back. If patient starts to develop shortness of breath or peripheral edema, Lasix can be restarted at 40 mg daily. She is currently on losartan 50 mg daily.  She has a history of chronic atrial fibrillation and not on anticoagulation due to history of multiple falls. 3 sets of cardiac enzymes were negative for ACS. 2-D echo showed EF of 60-65%, no aortic stenosis.  No neurological deficits, not orthostatic  Acute Back Pain: Patient is much more comfortable and moving after starting fentanyl patch 25 MCG/72hrs. Thoracic spine x-ray shows mild compression fracture of T8 vertebral body. Unfortunately could not obtain them MRI due to pacemaker. CT of the thoracic spine was done which did not show any disc herniation or nerve root compression. Continue pain control, Neurontin, Norco as needed.   Diabetes type 2: Continue metformin.  Day of Discharge BP 129/56  Pulse 70  Temp(Src) 97.8 F (36.6 C) (Oral)  Resp 18  Ht 5\' 4"  (1.626 m)  Wt 63.1 kg (139 lb 1.8 oz)  BMI 23.87 kg/m2  SpO2 98%  Physical Exam: General: Alert and awake oriented not in any acute distress. CVS: S1-S2 clear no murmur rubs or gallops Chest: clear to auscultation bilaterally, no wheezing rales or rhonchi Abdomen: soft nontender, nondistended, normal bowel sounds Extremities: no cyanosis, clubbing or edema noted bilaterally Neuro: Cranial nerves  II-XII intact, no focal neurological deficits   The results of significant diagnostics from this hospitalization (including imaging, microbiology, ancillary and laboratory) are listed below for reference.    LAB RESULTS: Basic Metabolic Panel:  Recent Labs Lab 08/06/13 1145  08/09/13 0423 08/10/13 0445  NA 138  < > 138 135  K 3.8  < > 3.8 4.9  CL 98  < > 98 99  CO2 28  < > 27 19  GLUCOSE 128*  < > 142* 133*  BUN 11  < > 15 17  CREATININE 0.63  < > 0.63 0.71  CALCIUM 9.2  < > 9.5 9.6  MG 1.6  --   --   --   < > = values in this interval not displayed. Liver Function Tests:  Recent Labs Lab 08/05/13 2018  AST 45*  ALT 13  ALKPHOS 64  BILITOT 0.8  PROT 7.4  ALBUMIN 4.3   No results found for this basename: LIPASE, AMYLASE,  in the last 168 hours No results found for this basename: AMMONIA,  in the last 168 hours CBC:  Recent Labs Lab 08/05/13 1315 08/05/13 2018  WBC 7.7 7.0  NEUTROABS 6.6 5.5  HGB 14.4 14.0  HCT 40.9 39.8  MCV 96.5 96.4  PLT 157 183   Cardiac Enzymes:  Recent Labs Lab 08/06/13 0010 08/06/13 0500  TROPONINI <0.30 <0.30   BNP: No components found with this basename: POCBNP,  CBG:  Recent Labs Lab 08/09/13 2103 08/10/13 0747  GLUCAP 151* 148*    Significant Diagnostic Studies:  Dg Chest 1 View  08/05/2013   CLINICAL DATA:  Fall, neck pain  EXAM: CHEST - 1 VIEW  COMPARISON:  05/27/2013  FINDINGS: Cardiomegaly again noted. Dual lead cardiac pacemaker is unchanged in position. There is mild interstitial prominence bilaterally without convincing pulmonary edema. No gross  fractures are identified. No segmental infiltrate. No diagnostic pneumothorax.  IMPRESSION: There is mild interstitial prominence bilaterally without convincing pulmonary edema. No gross fractures are identified. No segmental infiltrate. No diagnostic pneumothorax.   Electronically Signed   By: Natasha Mead   On: 08/05/2013 12:26   Dg Thoracic Spine 2 View  08/05/2013    CLINICAL DATA:  Fall, neck pain, back pain  EXAM: THORACIC SPINE - 2 VIEW  COMPARISON:  Lateral view of the chest 05/27/2013  FINDINGS: Three views of thoracic spine submitted. Alignment is preserved. There is mild compression deformity probable T8 vertebral body. This is new from prior exam. Clinical correlation is necessary. Acute fracture cannot be excluded.  IMPRESSION: There is mild compression fracture superior endplate of T8 vertebral body new from prior exam. Acute fracture cannot be excluded. Clinical correlation is necessary.   Electronically Signed   By: Natasha Mead   On: 08/05/2013 12:30   Ct Head Wo Contrast  08/05/2013   CLINICAL DATA:  Fall, dizziness  EXAM: CT HEAD WITHOUT CONTRAST  CT CERVICAL SPINE WITHOUT CONTRAST  TECHNIQUE: Multidetector CT imaging of the head and cervical spine was performed following the standard protocol without intravenous contrast. Multiplanar CT image reconstructions of the cervical spine were also generated.  COMPARISON:  04/01/2013  FINDINGS: CT HEAD FINDINGS  No skull fracture is noted. Paranasal sinuses and mastoid air cells are unremarkable.  No intracranial hemorrhage, mass effect or midline shift. No acute cortical infarction. No mass lesion is noted on this unenhanced scan. Stable cerebral atrophy. Stable periventricular and patchy subcortical chronic white matter disease. Ventricular size is stable from prior exam.  CT CERVICAL SPINE FINDINGS  Comparison exam 05/31/2013  Findings:  Axial images of the cervical spine shows no acute fracture or subluxation.  Computer processed images shows no acute fracture or subluxation. Stable degenerative changes C1-C2 articulation. Again noted disc space flattening with mild anterior and minimal posterior spurring at C5-C6 and C6-C7 level. Minimal disc space flattening at C7-T1 level. No prevertebral soft tissue swelling. Cervical airway is patent.  There is no pneumothorax in visualized lung apices. Bilateral apical scarring  is noted.  IMPRESSION: CT HEAD IMPRESSION  No acute intracranial abnormality. Stable atrophy and chronic white matter disease.  CT CERVICAL SPINE IMPRESSION  1. No acute fracture or subluxation. Stable degenerative changes as described above.   Electronically Signed   By: Natasha Mead   On: 08/05/2013 12:50   Ct Cervical Spine Wo Contrast  08/05/2013   CLINICAL DATA:  Fall, dizziness  EXAM: CT HEAD WITHOUT CONTRAST  CT CERVICAL SPINE WITHOUT CONTRAST  TECHNIQUE: Multidetector CT imaging of the head and cervical spine was performed following the standard protocol without intravenous contrast. Multiplanar CT image reconstructions of the cervical spine were also generated.  COMPARISON:  04/01/2013  FINDINGS: CT HEAD FINDINGS  No skull fracture is noted. Paranasal sinuses and mastoid air cells are unremarkable.  No intracranial hemorrhage, mass effect or midline shift. No acute cortical infarction. No mass lesion is noted on this unenhanced scan. Stable cerebral atrophy. Stable periventricular and patchy subcortical chronic white matter disease. Ventricular size is stable from prior exam.  CT CERVICAL SPINE FINDINGS  Comparison exam 05/31/2013  Findings:  Axial images of the cervical spine shows no acute fracture or subluxation.  Computer processed images shows no acute fracture or subluxation. Stable degenerative changes C1-C2 articulation. Again noted disc space flattening with mild anterior and minimal posterior spurring at C5-C6 and C6-C7 level. Minimal disc space  flattening at C7-T1 level. No prevertebral soft tissue swelling. Cervical airway is patent.  There is no pneumothorax in visualized lung apices. Bilateral apical scarring is noted.  IMPRESSION: CT HEAD IMPRESSION  No acute intracranial abnormality. Stable atrophy and chronic white matter disease.  CT CERVICAL SPINE IMPRESSION  1. No acute fracture or subluxation. Stable degenerative changes as described above.   Electronically Signed   By: Natasha Mead    On: 08/05/2013 12:50    2D ECHO: Study Conclusions  - Left ventricle: The cavity size was normal. Wall thickness was increased in a pattern of mild LVH. Systolic function was normal. The estimated ejection fraction was in the range of 60% to 65%. Although no diagnostic regional wall motion abnormality was identified, this possibility cannot be completely excluded on the basis of this study. Doppler parameters are consistent with restrictive physiology, indicative of decreased left ventricular diastolic compliance and/or increased left atrial pressure. - Aortic valve: There was no stenosis. - Mitral valve: Trivial regurgitation. - Left atrium: The atrium was moderately dilated. - Right ventricle: The cavity size was normal. Systolic function was mildly reduced. - Right atrium: The atrium was mildly dilated. - Tricuspid valve: Peak gradient:87mm Hg (D). - Pulmonary arteries: PA peak pressure: 71mm Hg (S). - Systemic veins: IVC not visualized. - Pericardium, extracardiac: A trivial pericardial effusion was identified posterior to the heart.    Disposition and Follow-up: Discharge Orders   Future Appointments Provider Department Dept Phone   08/18/2013 2:00 PM Cassell Clement, MD [facility] [employer] [facility] [phone]   08/23/2013 11:30 AM Kalman Shan, MD  Pulmonary Care [phone]   Future Orders Complete By Expires   Diet Carb Modified  As directed    Discharge instructions  As directed    Comments:     FALL PRECAUTIONS   Increase activity slowly  As directed        DISPOSITION: Skilled nursing facility DIET: Carb modified as tolerated ACTIVITY: As tolerated   DISCHARGE FOLLOW-UP Follow-up Information   Follow up with Benita Stabile, MD. Schedule an appointment as soon as possible for a visit in 2 weeks. [facility] FOLLOW-UP)    Specialty:  Family Medicine   Contact information:   [facility] [address]  [address] [place] [address] [phone]       Follow up with Cassell Clement, MD On 08/18/2013. (at 2:00 PM.)    Specialty:  Cardiology   Contact information:   [address] [place] [address] [phone]       Follow up with [facility], MD On 08/23/2013. (at 11:30AM)    Specialty:  Pulmonary Disease   Contact information:   [address] [place] [address] [phone]       Time spent on Discharge: 40 mins  Signed:   RAI,RIPUDEEP M.D. [facility] 08/10/2013, 11:00 AM Pager: [phone]

## 2013-08-11 ENCOUNTER — Other Ambulatory Visit: Payer: Self-pay

## 2013-08-11 MED ORDER — FENTANYL 25 MCG/HR TD PT72: MEDICATED_PATCH | TRANSDERMAL | Status: DC

## 2013-08-11 NOTE — Telephone Encounter (Signed)
Verified dose and instructions reflect manual request received by nursing home.   

## 2013-08-12 ENCOUNTER — Encounter (HOSPITAL_COMMUNITY): Payer: Medicare Other

## 2013-08-12 ENCOUNTER — Non-Acute Institutional Stay (SKILLED_NURSING_FACILITY): Payer: Medicare Other | Admitting: Nurse Practitioner

## 2013-08-12 DIAGNOSIS — J449 Chronic obstructive pulmonary disease, unspecified: Secondary | ICD-10-CM

## 2013-08-12 DIAGNOSIS — R278 Other lack of coordination: Secondary | ICD-10-CM

## 2013-08-12 DIAGNOSIS — I509 Heart failure, unspecified: Secondary | ICD-10-CM

## 2013-08-12 DIAGNOSIS — R279 Unspecified lack of coordination: Secondary | ICD-10-CM

## 2013-08-12 DIAGNOSIS — T148XXA Other injury of unspecified body region, initial encounter: Secondary | ICD-10-CM

## 2013-08-12 DIAGNOSIS — Z9981 Dependence on supplemental oxygen: Secondary | ICD-10-CM

## 2013-08-12 DIAGNOSIS — R5381 Other malaise: Secondary | ICD-10-CM

## 2013-08-12 DIAGNOSIS — F329 Major depressive disorder, single episode, unspecified: Secondary | ICD-10-CM

## 2013-08-12 DIAGNOSIS — R3915 Urgency of urination: Secondary | ICD-10-CM

## 2013-08-17 ENCOUNTER — Encounter (HOSPITAL_COMMUNITY): Payer: Medicare Other

## 2013-08-17 IMAGING — CT CT HEAD W/O CM
1 of 2 series · 13 of 30 positions shown, 17 images · non-contrast
Comparison: 01/06/2012

CLINICAL DATA: Altered mental status and visual field change.

CT HEAD WITHOUT CONTRAST
TECHNIQUE: Contiguous axial images were obtained from the base of
the skull through the vertex without contrast.

[Series 2: brain · axial · 0.47mm/px · z∈[+134,+277]mm · 13 of 32 slices shown, 17 images]
[im 3/32  brain]
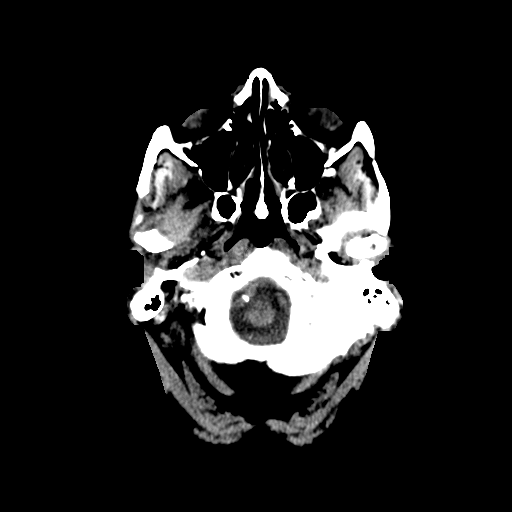
[im 3/32  bone]
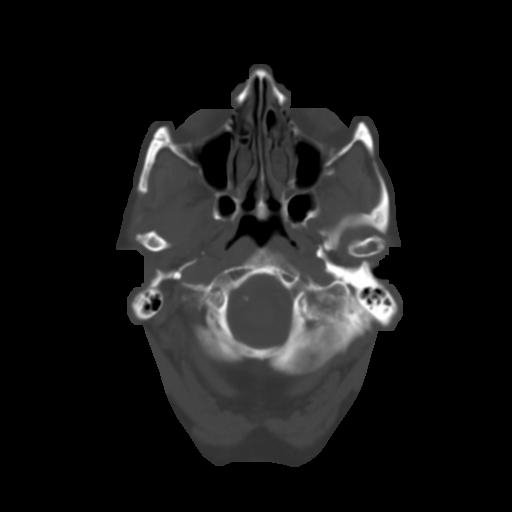
[im 5/32  brain]
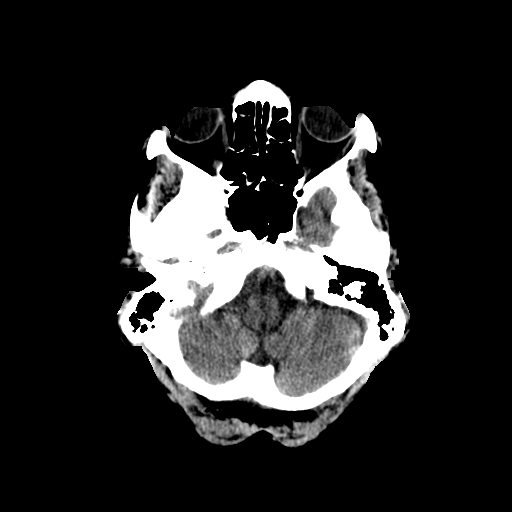
[im 7/32  brain]
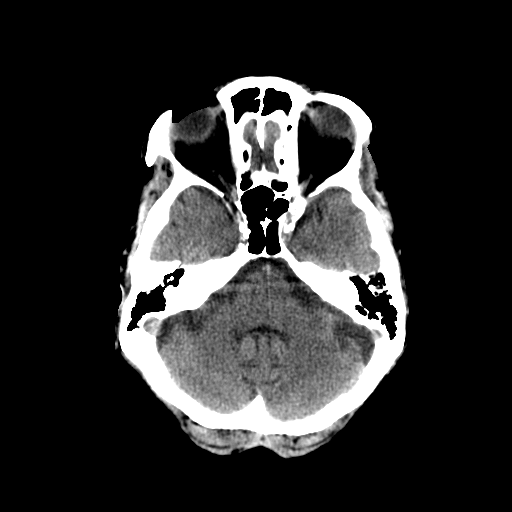
[im 9/32  brain]
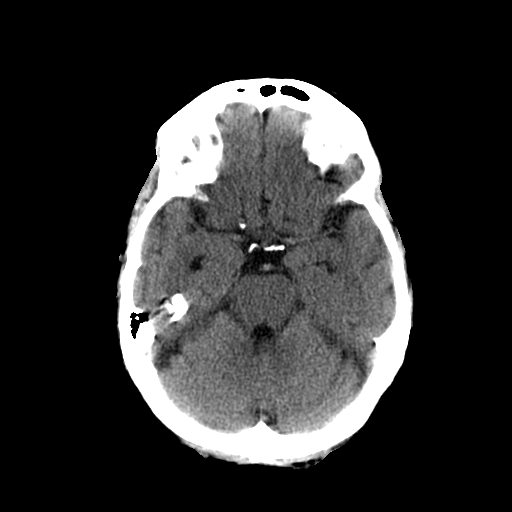
[im 12/32  brain]
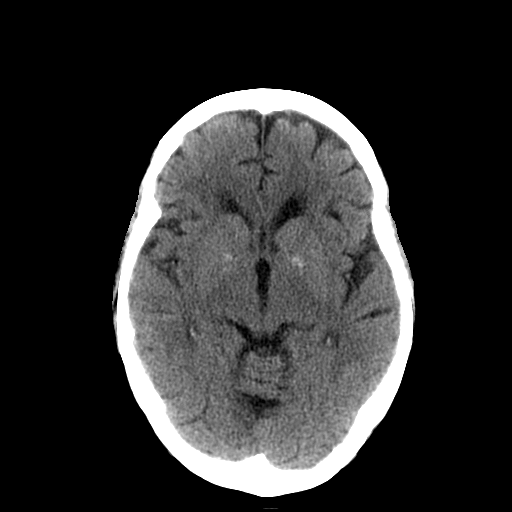
[im 12/32  bone]
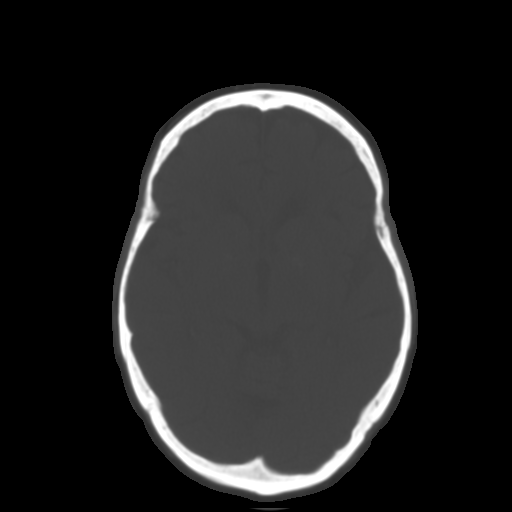
[im 14/32  brain]
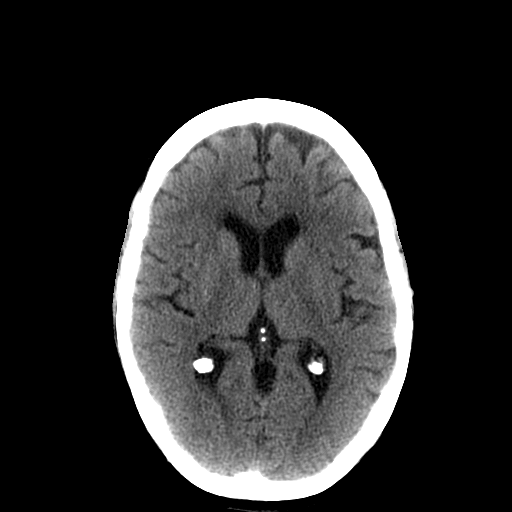
[im 16/32  brain]
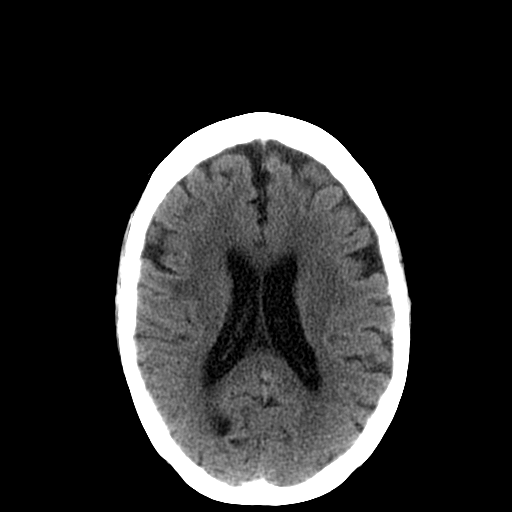
[im 18/32  brain]
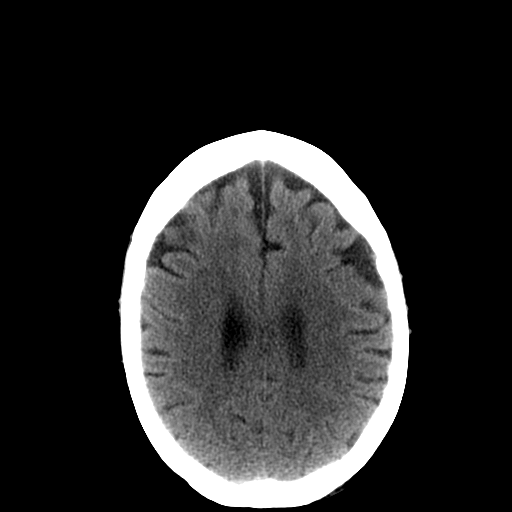
[im 20/32  brain]
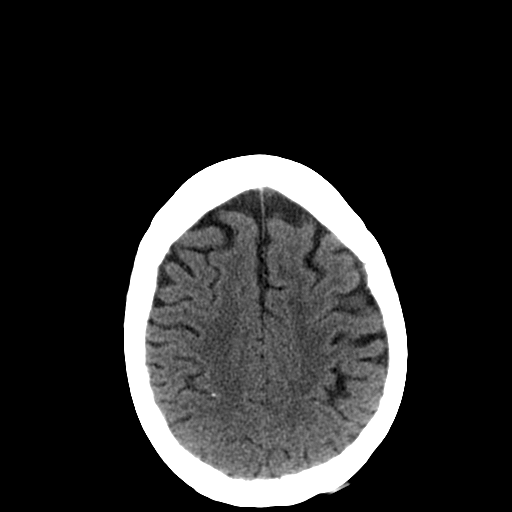
[im 20/32  bone]
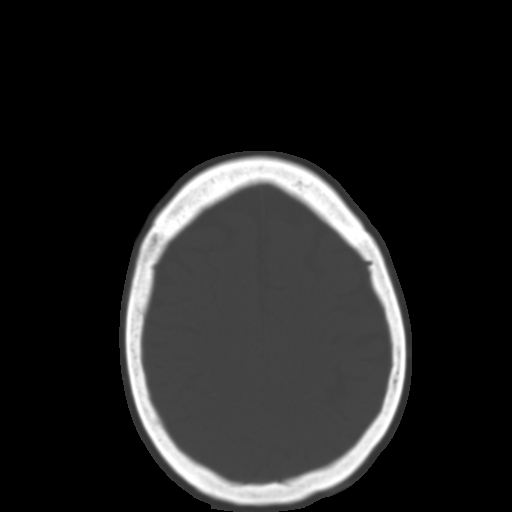
[im 23/32  brain]
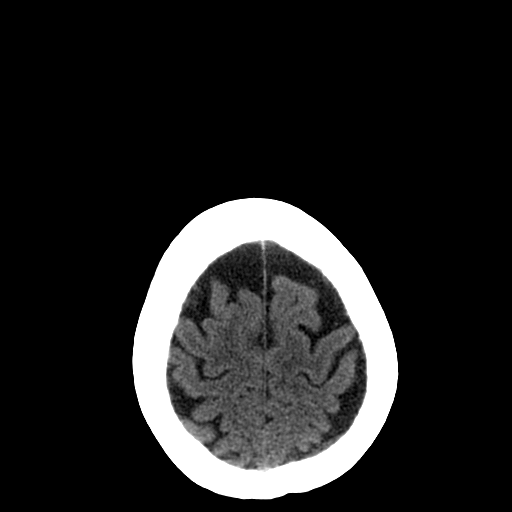
[im 25/32  brain]
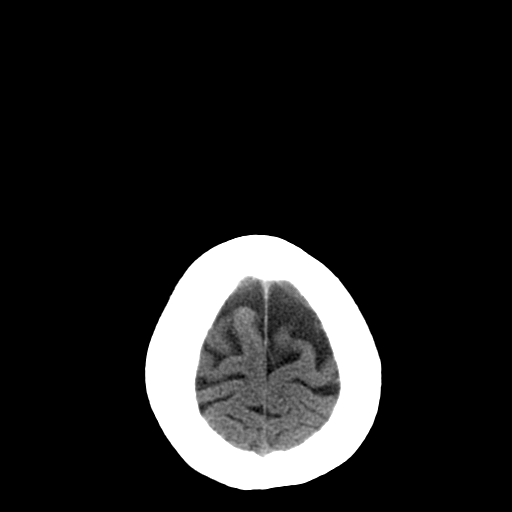
[im 27/32  brain]
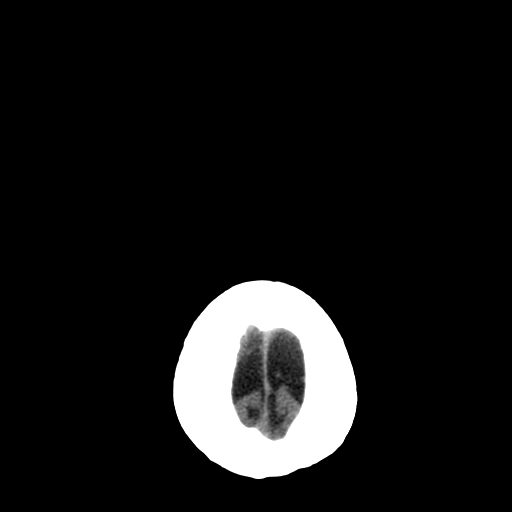
[im 29/32  brain]
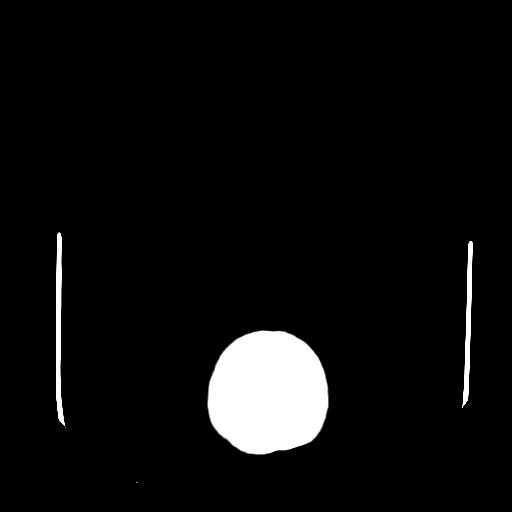
[im 29/32  bone]
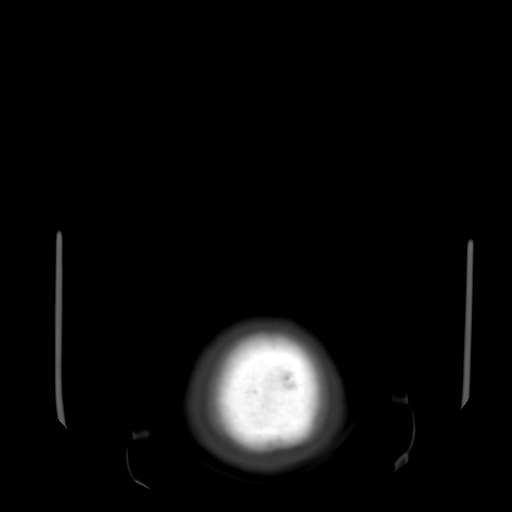

[13 of 30 positions shown; findings below may reference images not displayed]

FINDINGS: There are patchy low density areas involving the
subcortical and periventricular white matter.  The distribution of
the white matter disease has not significantly changed.  Stable
appearance of the ventricles and basal cisterns.  No evidence for
acute hemorrhage, mass lesion, midline shift, hydrocephalus or
large infarct.  The visualized sinuses are clear.  No acute bony
abnormality.
IMPRESSION: No acute intracranial abnormality.

Scattered low density throughout the white matter most likely
represents chronic small vessel ischemic changes.

## 2013-08-18 ENCOUNTER — Non-Acute Institutional Stay (SKILLED_NURSING_FACILITY): Payer: Medicare Other | Admitting: Nurse Practitioner

## 2013-08-18 ENCOUNTER — Ambulatory Visit: Payer: Medicare Other | Admitting: Cardiology

## 2013-08-18 DIAGNOSIS — J449 Chronic obstructive pulmonary disease, unspecified: Secondary | ICD-10-CM

## 2013-08-18 DIAGNOSIS — I471 Supraventricular tachycardia: Secondary | ICD-10-CM

## 2013-08-18 DIAGNOSIS — F411 Generalized anxiety disorder: Secondary | ICD-10-CM

## 2013-08-19 ENCOUNTER — Encounter (HOSPITAL_COMMUNITY): Payer: Medicare Other

## 2013-08-20 ENCOUNTER — Other Ambulatory Visit: Payer: Self-pay | Admitting: *Deleted

## 2013-08-20 MED ORDER — LORAZEPAM 0.5 MG PO TABS: ORAL_TABLET | ORAL | Status: DC

## 2013-08-23 ENCOUNTER — Ambulatory Visit: Payer: Medicare Other | Admitting: Internal Medicine

## 2013-08-24 ENCOUNTER — Encounter (HOSPITAL_COMMUNITY): Admission: RE | Admit: 2013-08-24 | Payer: Medicare Other | Source: Ambulatory Visit

## 2013-08-26 ENCOUNTER — Encounter (HOSPITAL_COMMUNITY): Payer: Medicare Other

## 2013-08-31 ENCOUNTER — Encounter: Payer: Self-pay | Admitting: Cardiology

## 2013-08-31 ENCOUNTER — Encounter (HOSPITAL_COMMUNITY): Payer: Medicare Other

## 2013-08-31 ENCOUNTER — Ambulatory Visit (INDEPENDENT_AMBULATORY_CARE_PROVIDER_SITE_OTHER): Payer: Medicare Other | Admitting: Cardiology

## 2013-08-31 VITALS — BP 140/86 | HR 90 | Ht 64.0 in | Wt 136.0 lb

## 2013-08-31 DIAGNOSIS — I272 Pulmonary hypertension, unspecified: Secondary | ICD-10-CM

## 2013-08-31 DIAGNOSIS — I340 Nonrheumatic mitral (valve) insufficiency: Secondary | ICD-10-CM

## 2013-08-31 DIAGNOSIS — I4891 Unspecified atrial fibrillation: Secondary | ICD-10-CM

## 2013-08-31 DIAGNOSIS — I059 Rheumatic mitral valve disease, unspecified: Secondary | ICD-10-CM

## 2013-08-31 DIAGNOSIS — I119 Hypertensive heart disease without heart failure: Secondary | ICD-10-CM

## 2013-08-31 DIAGNOSIS — I2789 Other specified pulmonary heart diseases: Secondary | ICD-10-CM

## 2013-08-31 DIAGNOSIS — I422 Other hypertrophic cardiomyopathy: Secondary | ICD-10-CM

## 2013-08-31 DIAGNOSIS — I10 Essential (primary) hypertension: Secondary | ICD-10-CM

## 2013-08-31 NOTE — Progress Notes (Signed)
[name] [name] Date of Birth:  [birth year] [id number] [facility] [address] [place], [place]  [address] [phone]         Fax   [phone]  History of Present Illness: This pleasant 77 year old Caucasian female is seen as a post hospital office visit.  She was recently hospitalized at [facility] and from there was sent to [facility] place for rehabilitation  She has not been feeling well.  She has a history of multiple falls.  She is no longer on Coumadin for her atrial fibrillation because of her multiple falls.  Her sister came with her today and states that she fell 3 days the first day she arrived at the rehabilitation Center.  She has hit her head on multiple occasions.  She's had a history of chronic occipital headaches.  In the hospital she was started on fentanyl patch which has helped he head and neck and spine pain. She has a history of chronic atrial fibrillation.  She has a pacemaker in place.  She has known LVH.  Her last echocardiogram was 03/01/13 and showed severe pulmonary hypertension with PA pressure 82 mmHg.  Her left ventricular ejection fraction was 60-65% and there was mild LVH.  She has a past history of hypertrophic cardiomyopathy although this was not demonstrated on her most recent echo.  In the hospital she was felt to be dehydrated and her Lasix was held.  It has subsequently been restarted at a lower dose of 40 mg daily at the nursing home and at the present time she appears to be euvolemic.  Current Outpatient Prescriptions  Medication Sig Dispense Refill  . acetaminophen (TYLENOL) 325 MG tablet Take 2 tablets (650 mg total) by mouth every 6 (six) hours as needed.      [name] aluminum & magnesium hydroxide-simethicone (MYLANTA) 500-450-40 MG/5ML suspension Take 10 mLs by mouth every 6 (six) hours as needed for indigestion.      [name] aspirin EC 81 MG EC tablet Take 1 tablet (81 mg total) by mouth daily.      . bisacodyl (DULCOLAX) 10 MG suppository Place 1 suppository (10 mg  total) rectally as needed for constipation.  12 suppository  0  . bisoprolol (ZEBETA) 10 MG tablet Take 10 mg by mouth daily.      . budesonide-formoterol (SYMBICORT) 160-4.5 MCG/ACT inhaler Inhale 2 puffs into the lungs 2 (two) times daily as needed (sortness of breath).      . conjugated estrogens (PREMARIN) vaginal cream Place 0.625 g vaginally daily as needed. For vaginal dryness      . diltiazem (CARDIZEM CD) 240 MG 24 hr capsule Take 240 mg by mouth daily.      . fentaNYL (DURAGESIC - DOSED MCG/HR) 25 MCG/HR patch 1 patch every 72 hours, REMOVE old patch, EXTERNAL use only, Rotate sites,check placement every shift  10 patch  0  . fluticasone (FLONASE) 50 MCG/ACT nasal spray Place 2 sprays into both nostrils as needed for rhinitis or allergies.      . furosemide (LASIX) 40 MG tablet Take 1 tablet (40 mg total) by mouth daily as needed for fluid or edema.  30 tablet  5  . gabapentin (NEURONTIN) 300 MG capsule Take 300 mg by mouth 3 (three) times daily.       [name] HYDROcodone-acetaminophen (NORCO) 7.5-325 MG per tablet Take 1 tablet by mouth every 6 (six) hours as needed for pain.  30 tablet  0  . lidocaine (LIDODERM) 5 % Place 1 patch  onto the skin daily. Remove & Discard patch within 12 hours or as directed by MD  30 patch  0  . LORazepam (ATIVAN) 0.5 MG tablet Take 1/2 tablet by mouth every morning; Take 1/2 tablet by mouth every 8 hours as needed for anxiety  60 tablet  5  . losartan (COZAAR) 50 MG tablet Take 1 tablet (50 mg total) by mouth daily.  30 tablet  3  . metFORMIN (GLUCOPHAGE) 500 MG tablet Take 500 mg by mouth 2 (two) times daily.       . methocarbamol (ROBAXIN) 500 MG tablet Take 1 tablet (500 mg total) by mouth 3 (three) times daily.  60 tablet  0  . oxybutynin (OXYTROL) 3.9 MG/24HR Place 1 patch onto the skin 2 (two) times a week.      . potassium chloride SA (K-DUR,KLOR-CON) 20 MEQ tablet Take 20 mEq by mouth 2 (two) times daily.       [name] PROAIR HFA 108 (90 BASE) MCG/ACT inhaler  Inhale 2 puffs into the lungs every 6 (six) hours as needed. For shortness of breath/wheezing      . senna (SENOKOT) 8.6 MG TABS tablet Take 1 tablet (8.6 mg total) by mouth 2 (two) times daily.  30 each  0  . sertraline (ZOLOFT) 100 MG tablet Take 200 mg by mouth daily.       . simvastatin (ZOCOR) 10 MG tablet Take 10 mg by mouth at bedtime.      . temazepam (RESTORIL) 7.5 MG capsule Take 1 capsule (7.5 mg total) by mouth at bedtime as needed for sleep or anxiety. Insomnia  20 capsule  0  . buPROPion (WELLBUTRIN XL) 300 MG 24 hr tablet Take 300 mg by mouth daily.      . famotidine (PEPCID) 20 MG tablet Take 20 mg by mouth 2 (two) times daily as needed. For GERD      . OXYGEN-HELIUM IN Inhale into the lungs as directed.       No current facility-administered medications for this visit.    Allergies  Allergen Reactions  . Calan [Verapamil Hcl] Other (See Comments)    Patient states it makes her "out of her mind"; bp bottoms out  . Crestor [Rosuvastatin Calcium] Other (See Comments)    Muscle pain  . Escitalopram Oxalate Other (See Comments)    Unknown   . Lipitor [Atorvastatin Calcium] Other (See Comments)    myalgia  . Lopressor [Metoprolol Tartrate] Other (See Comments)    Blood pressure bottoms out   . Norvasc [Amlodipine Besylate] Other (See Comments)    fatigue  . Nsaids Nausea Only  . Prednisone     SWELLING  . Sulfa Antibiotics Nausea Only and Other (See Comments)    Makes her stomach hurt  . Vimovo [Naproxen-Esomeprazole] Nausea Only  . Penicillins Hives and Rash    Patient Active Problem List   Diagnosis Date Noted  . Back pain 08/06/2013  . Near syncope 08/06/2013  . Pulmonary hypertension 08/05/2013  . Abnormality of gait 05/24/2013  . Acute respiratory distress 05/08/2013  . Pulmonary edema, acute 05/08/2013  . Hypertension   . Pulmonary HTN   . Oxygen dependent   . COPD (chronic obstructive pulmonary disease)   . Fatigue due to cardiopulmonary and  deconditioning 04/23/2013  . Memory deficit 02/07/2013  . Memory change 12/11/2012  . TIA (transient ischemic attack) 02/19/2012  . Chest pain at rest 08/05/2011  . Type II or unspecified type diabetes mellitus without mention of complication,  uncontrolled 06/12/2011  . Fall 03/22/2011  . Mitral regurgitation 03/13/2011  . Hypertrophic cardiomyopathy   . Osteoarthritis   . Situational mixed anxiety and depressive disorder   . Hypercholesterolemia   . Pacemaker 02/26/2011  . Atrial fibrillation 01/28/2011  . CHRONIC OBSTRUCTIVE PULMONARY DISEASE, MODERATE 05/23/2010  . DEPRESSIVE DISORDER NOT ELSEWHERE CLASSIFIED 04/11/2010  . HYPERLIPIDEMIA 05/31/2009  . HYPERTENSION 05/31/2009  . ALLERGIC RHINITIS 05/31/2009  . SHORTNESS OF BREATH 05/31/2009  . Cough 05/31/2009    History  Smoking status  . Former Smoker -- 1.00 packs/day for 40 years  . Types: Cigarettes  . Quit date: 11/15/2005  Smokeless tobacco  . Never Used    History  Alcohol Use No    Family History  Problem Relation Age of Onset  . Other Father     died in plane crash  . Cancer Mother     lymphoma-NHL  . Coronary artery disease Grandchild   . Heart disease Maternal Grandmother     Review of Systems: Constitutional: no fever chills diaphoresis or fatigue or change in weight.  Head and neck: no hearing loss, no epistaxis, no photophobia or visual disturbance. Respiratory: No cough, shortness of breath or wheezing. Cardiovascular: No chest pain peripheral edema, palpitations. Gastrointestinal: No abdominal distention, no abdominal pain, no change in bowel habits hematochezia or melena. Genitourinary: No dysuria, no frequency, no urgency, no nocturia. Musculoskeletal:No arthralgias, no back pain, no gait disturbance or myalgias. Neurological: No dizziness, no headaches, no numbness, no seizures, no syncope, no weakness, no tremors. Hematologic: No lymphadenopathy, no easy bruising. Psychiatric: No  confusion, no hallucinations, no sleep disturbance.    Physical Exam: Filed Vitals:   08/31/13 1516  BP: 140/86  Pulse: 90   the general appearance reveals a chronically ill-appearing woman with nasal oxygen in place.  She is chronically pale.The head and neck exam reveals pupils equal and reactive.  Extraocular movements are full.  There is no scleral icterus.  The mouth and pharynx are normal.  The neck is supple.  The carotids reveal no bruits.  The jugular venous pressure is normal.  The  thyroid is not enlarged.  There is no lymphadenopathy.   Lungs reveal mild rhonchi. Expansion of the chest is symmetrical.  The precordium is quiet.  The pulse is regular and paced.  The first heart sound is normal.  The second heart sound is physiologically split.  There is no gallop rub or click.  There is a soft systolic murmur at the left sternal  There is no abnormal lift or heave.  The abdomen is soft and nontender.  The bowel sounds are normal.  The liver and spleen are not enlarged.  There are no abdominal masses.  There are no abdominal bruits.  Extremities reveal good pedal pulses.  There is no phlebitis or edema.  There is no cyanosis or clubbing.  Strength is normal and symmetrical in all extremities.  There is no lateralizing weakness.  There are no sensory deficits.  The skin is warm and dry.  There is no rash.    Assessment / Plan: Continue same medication such increase diltiazem to 240 mg daily.  Continue on Lasix 40 mg daily.  Recheck in 2 months for followup office visit.  She will continue to rehabilitation at [facility] place.

## 2013-08-31 NOTE — Assessment & Plan Note (Signed)
The patient has not been experiencing any acute episodes of paroxysmal nocturnal dyspnea.  She is not having any ankle edema the present time.

## 2013-08-31 NOTE — Patient Instructions (Signed)
INCREASE YOUR DILTIAZEM CD TO 240 MG DAILY  Your physician recommends that you schedule a follow-up appointment in: 2 MONTH OV

## 2013-08-31 NOTE — Assessment & Plan Note (Signed)
The patient remains in permanent atrial fibrillation.  She has a functioning pacemaker.  She has not had any TIA symptoms.  She is no longer on warfarin because of her frequent falls and head trauma.

## 2013-08-31 NOTE — Assessment & Plan Note (Signed)
The patient's blood pressure is high at times and her heart rate occasionally goes to 140.  For better blood pressure and rate control we are going to increase her diltiazem CD up to 240 mg daily.

## 2013-09-02 ENCOUNTER — Encounter (HOSPITAL_COMMUNITY): Payer: Medicare Other

## 2013-09-06 ENCOUNTER — Other Ambulatory Visit: Payer: Self-pay | Admitting: *Deleted

## 2013-09-06 ENCOUNTER — Ambulatory Visit: Payer: Medicare Other | Admitting: Neurology

## 2013-09-06 MED ORDER — HYDROCODONE-ACETAMINOPHEN 7.5-325 MG PO TABS: 1.0000 | ORAL_TABLET | Freq: Four times a day (QID) | ORAL | Status: DC | PRN

## 2013-09-06 NOTE — Telephone Encounter (Signed)
rx filled per protocol  

## 2013-09-07 ENCOUNTER — Other Ambulatory Visit: Payer: Self-pay | Admitting: *Deleted

## 2013-09-07 MED ORDER — FENTANYL 25 MCG/HR TD PT72: MEDICATED_PATCH | TRANSDERMAL | Status: DC

## 2013-09-16 ENCOUNTER — Other Ambulatory Visit: Payer: Self-pay

## 2013-09-20 ENCOUNTER — Non-Acute Institutional Stay (SKILLED_NURSING_FACILITY): Payer: Medicare Other | Admitting: Internal Medicine

## 2013-09-20 DIAGNOSIS — R269 Unspecified abnormalities of gait and mobility: Secondary | ICD-10-CM

## 2013-09-20 DIAGNOSIS — G629 Polyneuropathy, unspecified: Secondary | ICD-10-CM

## 2013-09-20 DIAGNOSIS — W102XXA Fall (on)(from) incline, initial encounter: Secondary | ICD-10-CM

## 2013-09-20 DIAGNOSIS — R2681 Unsteadiness on feet: Secondary | ICD-10-CM

## 2013-09-20 DIAGNOSIS — W108XXA Fall (on) (from) other stairs and steps, initial encounter: Secondary | ICD-10-CM

## 2013-09-20 DIAGNOSIS — I10 Essential (primary) hypertension: Secondary | ICD-10-CM

## 2013-09-20 DIAGNOSIS — G609 Hereditary and idiopathic neuropathy, unspecified: Secondary | ICD-10-CM

## 2013-09-20 DIAGNOSIS — I4891 Unspecified atrial fibrillation: Secondary | ICD-10-CM

## 2013-09-23 ENCOUNTER — Other Ambulatory Visit: Payer: Self-pay | Admitting: *Deleted

## 2013-09-23 MED ORDER — LORAZEPAM 0.5 MG PO TABS: ORAL_TABLET | ORAL | Status: DC

## 2013-10-01 ENCOUNTER — Encounter (HOSPITAL_COMMUNITY): Payer: Self-pay | Admitting: Emergency Medicine

## 2013-10-01 ENCOUNTER — Non-Acute Institutional Stay (SKILLED_NURSING_FACILITY): Payer: Medicare Other | Admitting: Nurse Practitioner

## 2013-10-01 ENCOUNTER — Emergency Department (HOSPITAL_COMMUNITY)
Admission: EM | Admit: 2013-10-01 | Discharge: 2013-10-01 | Disposition: A | Discharge status: Home / Self Care | Payer: Medicare Other | Attending: Emergency Medicine | Admitting: Emergency Medicine

## 2013-10-01 ENCOUNTER — Emergency Department (HOSPITAL_COMMUNITY): Payer: Medicare Other

## 2013-10-01 DIAGNOSIS — W19XXXA Unspecified fall, initial encounter: Secondary | ICD-10-CM

## 2013-10-01 DIAGNOSIS — Z7982 Long term (current) use of aspirin: Secondary | ICD-10-CM | POA: Insufficient documentation

## 2013-10-01 DIAGNOSIS — F329 Major depressive disorder, single episode, unspecified: Secondary | ICD-10-CM | POA: Insufficient documentation

## 2013-10-01 DIAGNOSIS — Z862 Personal history of diseases of the blood and blood-forming organs and certain disorders involving the immune mechanism: Secondary | ICD-10-CM | POA: Insufficient documentation

## 2013-10-01 DIAGNOSIS — Z8673 Personal history of transient ischemic attack (TIA), and cerebral infarction without residual deficits: Secondary | ICD-10-CM | POA: Insufficient documentation

## 2013-10-01 DIAGNOSIS — M1990 Unspecified osteoarthritis, unspecified site: Secondary | ICD-10-CM | POA: Insufficient documentation

## 2013-10-01 DIAGNOSIS — J449 Chronic obstructive pulmonary disease, unspecified: Secondary | ICD-10-CM | POA: Insufficient documentation

## 2013-10-01 DIAGNOSIS — T148XXA Other injury of unspecified body region, initial encounter: Secondary | ICD-10-CM

## 2013-10-01 DIAGNOSIS — Z95818 Presence of other cardiac implants and grafts: Secondary | ICD-10-CM | POA: Insufficient documentation

## 2013-10-01 DIAGNOSIS — Z79899 Other long term (current) drug therapy: Secondary | ICD-10-CM | POA: Insufficient documentation

## 2013-10-01 DIAGNOSIS — K219 Gastro-esophageal reflux disease without esophagitis: Secondary | ICD-10-CM | POA: Insufficient documentation

## 2013-10-01 DIAGNOSIS — S0510XA Contusion of eyeball and orbital tissues, unspecified eye, initial encounter: Secondary | ICD-10-CM | POA: Insufficient documentation

## 2013-10-01 DIAGNOSIS — Z950 Presence of cardiac pacemaker: Secondary | ICD-10-CM | POA: Insufficient documentation

## 2013-10-01 DIAGNOSIS — I4891 Unspecified atrial fibrillation: Secondary | ICD-10-CM | POA: Insufficient documentation

## 2013-10-01 DIAGNOSIS — W1809XA Striking against other object with subsequent fall, initial encounter: Secondary | ICD-10-CM | POA: Insufficient documentation

## 2013-10-01 DIAGNOSIS — E7800 Pure hypercholesterolemia, unspecified: Secondary | ICD-10-CM | POA: Insufficient documentation

## 2013-10-01 DIAGNOSIS — F3289 Other specified depressive episodes: Secondary | ICD-10-CM | POA: Insufficient documentation

## 2013-10-01 DIAGNOSIS — Z9981 Dependence on supplemental oxygen: Secondary | ICD-10-CM | POA: Insufficient documentation

## 2013-10-01 DIAGNOSIS — I509 Heart failure, unspecified: Secondary | ICD-10-CM | POA: Insufficient documentation

## 2013-10-01 DIAGNOSIS — Z8701 Personal history of pneumonia (recurrent): Secondary | ICD-10-CM | POA: Insufficient documentation

## 2013-10-01 DIAGNOSIS — S22000A Wedge compression fracture of unspecified thoracic vertebra, initial encounter for closed fracture: Secondary | ICD-10-CM

## 2013-10-01 DIAGNOSIS — E119 Type 2 diabetes mellitus without complications: Secondary | ICD-10-CM | POA: Insufficient documentation

## 2013-10-01 DIAGNOSIS — Y9210 Unspecified residential institution as the place of occurrence of the external cause: Secondary | ICD-10-CM | POA: Insufficient documentation

## 2013-10-01 DIAGNOSIS — H05232 Hemorrhage of left orbit: Secondary | ICD-10-CM

## 2013-10-01 DIAGNOSIS — Y9389 Activity, other specified: Secondary | ICD-10-CM | POA: Insufficient documentation

## 2013-10-01 DIAGNOSIS — S01109A Unspecified open wound of unspecified eyelid and periocular area, initial encounter: Secondary | ICD-10-CM | POA: Insufficient documentation

## 2013-10-01 DIAGNOSIS — S0990XA Unspecified injury of head, initial encounter: Secondary | ICD-10-CM

## 2013-10-01 DIAGNOSIS — Z880 Allergy status to penicillin: Secondary | ICD-10-CM | POA: Insufficient documentation

## 2013-10-01 DIAGNOSIS — R011 Cardiac murmur, unspecified: Secondary | ICD-10-CM | POA: Insufficient documentation

## 2013-10-01 DIAGNOSIS — J4489 Other specified chronic obstructive pulmonary disease: Secondary | ICD-10-CM | POA: Insufficient documentation

## 2013-10-01 DIAGNOSIS — Z23 Encounter for immunization: Secondary | ICD-10-CM | POA: Insufficient documentation

## 2013-10-01 DIAGNOSIS — I10 Essential (primary) hypertension: Secondary | ICD-10-CM | POA: Insufficient documentation

## 2013-10-01 DIAGNOSIS — F411 Generalized anxiety disorder: Secondary | ICD-10-CM | POA: Insufficient documentation

## 2013-10-01 DIAGNOSIS — S22009A Unspecified fracture of unspecified thoracic vertebra, initial encounter for closed fracture: Secondary | ICD-10-CM | POA: Insufficient documentation

## 2013-10-01 DIAGNOSIS — IMO0002 Reserved for concepts with insufficient information to code with codable children: Secondary | ICD-10-CM

## 2013-10-01 DIAGNOSIS — Z87891 Personal history of nicotine dependence: Secondary | ICD-10-CM | POA: Insufficient documentation

## 2013-10-01 LAB — CBC WITH DIFFERENTIAL/PLATELET
Eosinophils Absolute: 0.2 10*3/uL (ref 0.0–0.7)
Eosinophils Relative: 2 % (ref 0–5)
HCT: 41.2 % (ref 36.0–46.0)
Hemoglobin: 14.6 g/dL (ref 12.0–15.0)
Lymphocytes Relative: 14 % (ref 12–46)
Lymphs Abs: 1.2 10*3/uL (ref 0.7–4.0)
MCH: 33.8 pg (ref 26.0–34.0)
MCV: 95.4 fL (ref 78.0–100.0)
Monocytes Absolute: 0.8 10*3/uL (ref 0.1–1.0)
Monocytes Relative: 9 % (ref 3–12)
Neutrophils Relative %: 75 % (ref 43–77)
Platelets: 227 10*3/uL (ref 150–400)
RBC: 4.32 MIL/uL (ref 3.87–5.11)

## 2013-10-01 LAB — BASIC METABOLIC PANEL
BUN: 17 mg/dL (ref 6–23)
CO2: 24 mEq/L (ref 19–32)
Calcium: 9.9 mg/dL (ref 8.4–10.5)
Creatinine, Ser: 0.82 mg/dL (ref 0.50–1.10)
GFR calc non Af Amer: 68 mL/min — ABNORMAL LOW (ref >90)
Glucose, Bld: 155 mg/dL — ABNORMAL HIGH (ref 70–99)
Sodium: 134 mEq/L — ABNORMAL LOW (ref 135–145)

## 2013-10-01 LAB — POCT I-STAT TROPONIN I

## 2013-10-01 MED ORDER — TETANUS-DIPHTH-ACELL PERTUSSIS 5-2.5-18.5 LF-MCG/0.5 IM SUSP
0.5000 mL | Freq: Once | INTRAMUSCULAR | Status: AC
  Administered 2013-10-01: 0.5 mL via INTRAMUSCULAR
  Filled 2013-10-01: qty 0.5

## 2013-10-01 MED ORDER — OXYCODONE-ACETAMINOPHEN 5-325 MG PO TABS
1.0000 | ORAL_TABLET | Freq: Once | ORAL | Status: AC
  Administered 2013-10-01: 1 via ORAL
  Filled 2013-10-01: qty 1

## 2013-10-01 NOTE — ED Notes (Signed)
Pt walked to the restroom very well with one person assist.

## 2013-10-01 NOTE — ED Notes (Signed)
Patient left department with family bound for [facility]. Called and spoke with [facility] personnel to inform of patient's return and plan of care. Staff verbalized understanding and are ready to accept patient.

## 2013-10-01 NOTE — ED Notes (Signed)
EKG completed and given to EDP.  

## 2013-10-01 NOTE — ED Notes (Signed)
MD at bedside. 

## 2013-10-01 NOTE — ED Notes (Signed)
Onset today stood up from a chair tripped hit left side of head and periorbital swelling ecchymosis light blue.  Pupils equal and reactive brisk. Ax4 Denies LOC per EMS.

## 2013-10-01 NOTE — ED Provider Notes (Signed)
CSN: [id number]     Arrival date & time 10/01/13  1758 History   First MD Initiated Contact with Patient 10/01/13 1846     Chief Complaint  Patient presents with  . Fall    Patient is a 77 y.o. female presenting with fall. The history is provided by the patient and a relative.  Fall This is a new problem. The problem has been gradually improving. Associated symptoms include headaches. Pertinent negatives include no chest pain, no abdominal pain and no shortness of breath. Exacerbated by: palpation of head. The symptoms are relieved by rest. She has tried rest for the symptoms. The treatment provided mild relief.  pt was trying to walk while at rehab facility and she fell She has long h/o falls and is now living in rehab facility She was not supposed to ambulate alone but she decided to walk on her own She denies cp/sob/dizziness  No LOC reported She now reports pain/laceration near left eye  Past Medical History  Diagnosis Date  . Hypertrophic cardiomyopathy   . Hypertension   . Situational mixed anxiety and depressive disorder   . Hypercholesterolemia   . Falls frequently     coumadin stopped  . Pulmonary HTN     has had prior right heart cath in 2010; was felt that most likely due to elevated left sided pressures and would not benefit from vasodilator therapy  . Oxygen dependent     "3L 24/7" (08/05/2013)  . COPD (chronic obstructive pulmonary disease)   . TIA (transient ischemic attack)   . Atrial fibrillation     Chronic; No longer on coumadin  . CHF (congestive heart failure)   . Pacemaker   . Family history of anesthesia complication     "they have to breath for mom for awhile after anesthesia" (08/05/2013)  . Heart murmur   . Varicose veins   . Pneumonia     "couple times; long time ago" (08/05/2013)  . Exertional shortness of breath   . Type II diabetes mellitus   . Anemia     "as a child" (08/05/2013)  . GERD (gastroesophageal reflux disease)   . H/O hiatal hernia    . Osteoarthritis   . Arthritis     "all over me; in all my joints" (08/05/2013)  . Coccyx pain   . Gout     "right foot" (08/05/2013)  . Anxiety   . Depression    Past Surgical History  Procedure Laterality Date  . Total knee arthroplasty Right 2011  . Tonsillectomy    . Appendectomy    . Abdominal hysterectomy      "partial" (08/05/2013)  . Dilation and curettage of uterus      "several" (08/05/2013)  . Foot neuroma surgery Right   . Insert / replace / remove pacemaker  1998; 2000's; 2014  . Cardiac catheterization      "more than once" (08/05/2013)  . Back surgery      "bone spur removed; mid back" (08/05/2013)   Family History  Problem Relation Age of Onset  . Other Father     died in plane crash  . Cancer Mother     lymphoma-NHL  . Coronary artery disease Grandchild   . Heart disease Maternal Grandmother    History  Substance Use Topics  . Smoking status: Former Smoker -- 1.00 packs/day for 40 years    Types: Cigarettes    Quit date: 11/15/2005  . Smokeless tobacco: Never Used  . Alcohol Use: No  OB History   Grav Para Term Preterm Abortions TAB SAB Ect Mult Living                 Review of Systems  Respiratory: Negative for shortness of breath.   Cardiovascular: Negative for chest pain.  Gastrointestinal: Negative for abdominal pain.  Neurological: Positive for headaches.  All other systems reviewed and are negative.    Allergies  Calan; Crestor; Escitalopram oxalate; Lipitor; Lopressor; Norvasc; Nsaids; Prednisone; Sulfa antibiotics; Vimovo; and Penicillins  Home Medications   Current Outpatient Rx  Name  Route  Sig  Dispense  Refill  . aluminum & magnesium hydroxide-simethicone (MYLANTA) 500-450-40 MG/5ML suspension   Oral   Take 10 mLs by mouth every 6 (six) hours as needed for indigestion.         [name] aspirin EC 81 MG EC tablet   Oral   Take 1 tablet (81 mg total) by mouth daily.         . bisacodyl (DULCOLAX) 10 MG suppository    Rectal   Place 1 suppository (10 mg total) rectally as needed for constipation.   12 suppository   0   . budesonide-formoterol (SYMBICORT) 160-4.5 MCG/ACT inhaler   Inhalation   Inhale 2 puffs into the lungs 2 (two) times daily as needed (shortness of breath).          [name] buPROPion (WELLBUTRIN XL) 300 MG 24 hr tablet   Oral   Take 300 mg by mouth daily.         [name] conjugated estrogens (PREMARIN) vaginal cream   Vaginal   Place 0.625 g vaginally daily as needed. For vaginal dryness         . diltiazem (CARDIZEM CD) 240 MG 24 hr capsule   Oral   Take 240 mg by mouth daily.         . famotidine (PEPCID) 20 MG tablet   Oral   Take 20 mg by mouth 2 (two) times daily as needed. For GERD         . fentaNYL (DURAGESIC - DOSED MCG/HR) 25 MCG/HR patch      1 patch every 72 hours, REMOVE old patch, EXTERNAL use only, Rotate sites,check placement every shift   10 patch   0   . fluticasone (FLONASE) 50 MCG/ACT nasal spray   Each Nare   Place 2 sprays into both nostrils as needed for rhinitis or allergies.         . furosemide (LASIX) 40 MG tablet   Oral   Take 40 mg by mouth daily.         [name] gabapentin (NEURONTIN) 300 MG capsule   Oral   Take 300 mg by mouth 3 (three) times daily.          [name] HYDROcodone-acetaminophen (NORCO) 7.5-325 MG per tablet   Oral   Take 1 tablet by mouth every 6 (six) hours as needed for pain.   120 tablet   0   . lidocaine (LIDODERM) 5 %   Transdermal   Place 1 patch onto the skin daily. Remove & Discard patch within 12 hours or as directed by MD   30 patch   0   . losartan (COZAAR) 50 MG tablet   Oral   Take 1 tablet (50 mg total) by mouth daily.   30 tablet   3     REDUCED DOSE   . metFORMIN (GLUCOPHAGE) 500 MG tablet   Oral   Take 500 mg by  mouth 2 (two) times daily.          . methocarbamol (ROBAXIN) 500 MG tablet   Oral   Take 1 tablet (500 mg total) by mouth 3 (three) times daily.   60 tablet   0   . potassium  chloride SA (K-DUR,KLOR-CON) 20 MEQ tablet   Oral   Take 20 mEq by mouth 2 (two) times daily.          [name] PROAIR HFA 108 (90 BASE) MCG/ACT inhaler   Inhalation   Inhale 2 puffs into the lungs every 6 (six) hours as needed. For shortness of breath/wheezing         . senna (SENOKOT) 8.6 MG TABS tablet   Oral   Take 1 tablet (8.6 mg total) by mouth 2 (two) times daily.   30 each   0   . sertraline (ZOLOFT) 100 MG tablet   Oral   Take 200 mg by mouth daily.          . simvastatin (ZOCOR) 10 MG tablet   Oral   Take 10 mg by mouth at bedtime.         . temazepam (RESTORIL) 7.5 MG capsule   Oral   Take 1 capsule (7.5 mg total) by mouth at bedtime as needed for sleep or anxiety. Insomnia   20 capsule   0   . OXYGEN-HELIUM IN   Inhalation   Inhale into the lungs as directed.          BP 154/95  Pulse 84  Temp(Src) 98.6 F (37 C) (Oral)  Resp 18  SpO2 99% Physical Exam CONSTITUTIONAL: Well developed/well nourished HEAD: Normocephalic/atraumatic EYES: EOMI/PERRL. No injury noted to globe.  No hyphema noted.   ENMT: Mucous membranes moist, she has laceration/bruising noted just external left orbit.  Bleeding controlled.  Bruising noted below left orbit.  No evidence of nasal/dental trauma.  No septal hematoma noted NECK: supple no meningeal signs SPINE:cervical/thoracic spine nontender, diffuse lumbar tenderness (chronic) CV: S1/S2 noted, no murmurs/rubs/gallops noted LUNGS: Lungs are clear to auscultation bilaterally, no apparent distress ABDOMEN: soft, nontender, no rebound or guarding GU:no cva tenderness NEURO: Pt is awake/alert, moves all extremitiesx4, no focal weakness noted EXTREMITIES: pulses normal, full ROM, All other extremities/joints palpated/ranged and nontender SKIN: warm, color normal PSYCH: no abnormalities of mood noted  ED Course  Procedures (including critical care time)  LACERATION REPAIR Performed by: Joya Gaskins Consent: Verbal  consent obtained. Risks and benefits: risks, benefits and alternatives were discussed Patient identity confirmed: provided demographic data Time out performed prior to procedure Prepped and Draped in normal sterile fashion Wound explored Laceration Location: left orbital region Laceration Length: 2cm No Foreign Bodies seen or palpated Anesthesia: local infiltration Local anesthetic: lidocaine 1% with epinephrine Anesthetic total: 5 ml Irrigation method: syringe Amount of cleaning: standard Skin closure: simple interrupeted Number of sutures or staples: 5 Technique: simple - vicryl Patient tolerance: Patient tolerated the procedure well with no immediate complications.  Labs Review Labs Reviewed  CBC WITH DIFFERENTIAL  BASIC METABOLIC PANEL  POCT I-STAT TROPONIN I   Imaging Review No results found.  EKG Interpretation    Date/Time:  Friday October 01 2013 18:18:02 EST Ventricular Rate:  75 PR Interval:    QRS Duration: 98 QT Interval:  429 QTC Calculation: 479 R Axis:   70 Text Interpretation:  Atrial fibrillation LVH with secondary repolarization abnormality Anterior Q waves, possibly due to LVH ST depr, consider ischemia, inferior leads No significant change  since last tracing Confirmed by Bebe Shaggy  MD, Eowyn Tabone [phone]) on 10/01/2013 6:26:57 PM            MDM  No diagnosis found. Nursing notes including past medical history and social history reviewed and considered in documentation Labs/vital reviewed and considered   Pt stable for d/c home She is at baseline She has had progressive memory decline/confusion for awhile, and has neuro followup She is in rehab facility, and appears safe for d/c home ?new thoracic compression fx without complicating features or neuro deficits     Joya Gaskins, MD 10/01/13 2211

## 2013-10-03 DIAGNOSIS — R2681 Unsteadiness on feet: Secondary | ICD-10-CM | POA: Insufficient documentation

## 2013-10-03 DIAGNOSIS — I10 Essential (primary) hypertension: Secondary | ICD-10-CM | POA: Insufficient documentation

## 2013-10-03 DIAGNOSIS — G629 Polyneuropathy, unspecified: Secondary | ICD-10-CM | POA: Insufficient documentation

## 2013-10-03 DIAGNOSIS — W102XXA Fall (on)(from) incline, initial encounter: Secondary | ICD-10-CM | POA: Insufficient documentation

## 2013-10-03 NOTE — Progress Notes (Signed)
Patient ID: [name] [name], female   DOB: 01/24/1936, 77 y.o.   MRN: [id number]  Have changed her cardizem from 120 mg bid t diltiazem 240 mg daily for now  Also will change her ativan 0.25 mg daily and every 8 hour as needed for now. Avoid benzos when possible

## 2013-10-03 NOTE — Progress Notes (Signed)
Patient ID: [name] [name], female   DOB: 1936/10/26, 77 y.o.   MRN: [id number]  [name] [name] and rehab  Allergies  Allergen Reactions  . Calan [Verapamil Hcl] Other (See Comments)    Patient states it makes her "out of her mind"; bp bottoms out  . Crestor [Rosuvastatin Calcium] Other (See Comments)    Muscle pain  . Escitalopram Oxalate Other (See Comments)    Unknown   . Lipitor [Atorvastatin Calcium] Other (See Comments)    myalgia  . Lopressor [Metoprolol Tartrate] Other (See Comments)    Blood pressure bottoms out   . Norvasc [Amlodipine Besylate] Other (See Comments)    fatigue  . Nsaids Nausea Only  . Prednisone     SWELLING  . Sulfa Antibiotics Nausea Only and Other (See Comments)    Makes her stomach hurt  . Vimovo [Naproxen-Esomeprazole] Nausea Only  . Penicillins Hives and Rash   Chief Complaint  Patient presents with  . Medical Managment of Chronic Issues   HPI 77 y/o female patient is here for STR and has been having frequent falls recently. She had a fall yesterday. Patient mentions about trying to get out of the bed by herself to go to the bathroom when aids dont appear to help her. This is when she has falls. She is on o2 by nasal canula and has a floor mat on [name]. She mentions tripping on the floor mat. She is alert and oriented. She complaints of pain at site of trauma post fall  Review of Systems  Constitutional: Negative for fever, chills, weight loss, malaise/fatigue and diaphoresis.  HENT: Negative for congestion, hearing loss and sore throat.   Eyes: Negative for blurred vision, double vision and discharge.  Respiratory: Negative for cough, sputum production, shortness of breath and wheezing.   Cardiovascular: Negative for chest pain, palpitations, orthopnea and leg swelling.  Gastrointestinal: Negative for heartburn, nausea, vomiting, abdominal pain, diarrhea and constipation.  Genitourinary: Negative for dysuria, urgency, frequency and flank pain.  Skin:  Negative for itching and rash.  Neurological: Positive for weakness. Negative for dizziness, tingling, focal weakness and headaches.  Psychiatric/Behavioral: Negative for depression and memory loss. The patient is not nervous/anxious. Sleeping well at night  Past Medical History  Diagnosis Date  . Hypertrophic cardiomyopathy   . Hypertension   . Situational mixed anxiety and depressive disorder   . Hypercholesterolemia   . Falls frequently     coumadin stopped  . Pulmonary HTN     has had prior right heart cath in 2010; was felt that most likely due to elevated left sided pressures and would not benefit from vasodilator therapy  . Oxygen dependent     "3L 24/7" (08/05/2013)  . COPD (chronic obstructive pulmonary disease)   . TIA (transient ischemic attack)   . Atrial fibrillation     Chronic; No longer on coumadin  . CHF (congestive heart failure)   . Pacemaker   . Family history of anesthesia complication     "they have to breath for mom for awhile after anesthesia" (08/05/2013)  . Heart murmur   . Varicose veins   . Pneumonia     "couple times; long time ago" (08/05/2013)  . Exertional shortness of breath   . Type II diabetes mellitus   . Anemia     "as a child" (08/05/2013)  . GERD (gastroesophageal reflux disease)   . H/O hiatal hernia   . Osteoarthritis   . Arthritis     "all over me;  in all my joints" (08/05/2013)  . Coccyx pain   . Gout     "right foot" (08/05/2013)  . Anxiety   . Depression    Past Surgical History  Procedure Laterality Date  . Total knee arthroplasty Right 2011  . Tonsillectomy    . Appendectomy    . Abdominal hysterectomy      "partial" (08/05/2013)  . Dilation and curettage of uterus      "several" (08/05/2013)  . Foot neuroma surgery Right   . Insert / replace / remove pacemaker  1998; 2000's; 2014  . Cardiac catheterization      "more than once" (08/05/2013)  . Back surgery      "bone spur removed; mid back" (08/05/2013)   Medication  reviewed  Physical exam  Vitals reviewed. Negative for orthostatics. Reviewed nursing notes Lying down- 110/64, 64/min Sitting 125/73, 72/min Standing 120/68 , 70/min  General- elderly female in no acute distress Head- atraumatic, normocephalic Eyes- PERRLA, EOMI, no pallor, no icterus, no discharge Neck- no lymphadenopathy, no thyromegaly, no jugular vein distension, no carotid bruit Chest- no chest wall deformities, no chest wall tenderness Cardiovascular- normal s1,s2, no murmurs/ rubs/ gallops Respiratory- bilateral clear to auscultation, no wheeze, no rhonchi, no crackles Abdomen- bowel sounds present, soft, non tender, no organomegaly Musculoskeletal- able to move all 4 extremities, no spinal and paraspinal tenderness, unsteady gait Neurological- no focal deficit Psychiatry- alert and oriented to person, [name] and time, normal mood and affect Skin- bruise on back and lower chest area  Labs reviewed   Assessment/plan  Frequent falls- ruled out orthostatics. She has dm with peripheral neuropathy and this could be contributing to the fall. Environmental factors like oxygen tube and floor mat could also be contributing to this with her waking up early to go to the bathroom by herself. Thus, will have PT referral for gait training and to talk to the staff to help provide extra assistance with going to the bathroom. Will remove the floor mat and have her oxygen tube in [name]  Unsteady gait- will have her work with therapy team for gait training  Peripheral neuropathy- in setting of longstanding dm , continue neurontin, gait training and fall precautions  Hypertension- monitor bp closely to assess for change or dose reduction on bp medications. Will continue her diltiazem and lasix for now   afib- rate controlled. Continue diltiazem and aspirin  Dm type 2- no hypoglycemic episode. Monitor cbg, continue metformin

## 2013-10-05 ENCOUNTER — Ambulatory Visit (INDEPENDENT_AMBULATORY_CARE_PROVIDER_SITE_OTHER): Payer: Medicare Other | Admitting: Neurology

## 2013-10-05 ENCOUNTER — Encounter: Payer: Self-pay | Admitting: Neurology

## 2013-10-05 VITALS — BP 126/75 | HR 78 | Wt 127.0 lb

## 2013-10-05 DIAGNOSIS — Z950 Presence of cardiac pacemaker: Secondary | ICD-10-CM

## 2013-10-05 DIAGNOSIS — R269 Unspecified abnormalities of gait and mobility: Secondary | ICD-10-CM | POA: Insufficient documentation

## 2013-10-05 NOTE — Progress Notes (Signed)
[facility]  PATIENT: [name] [name] DOB: [birth year]  HISTORICAL  [name] [name] is a 77 years old right-handed Caucasian female, referred by her primary care physician Dr. Lupe Carney for evaluation of gait difficulty.  She had past medical history of hypertension, diabetes, hyperlipidemia, atrial fibrillation, depression, anxiety, COPD, home oxygen dependent, presenting with gait difficulty since 2012  She fell, landed on her left parietal region, she has transient loss of consciousness, require stitches to her left skull, she had dizziness, was diagnosed with benign positional vertigo, underwent vestibular rehabilitation for 3 weeks, with improvement, but she still has intermittent episodes dizziness when lying on her right side, she felt spinning sensation, with mild hearing loss  She also has unsteady gait, difficulty walking, staggering to the right or left, subjective symmetric weakness of her bilateral lower extremity, no numbness, she has urinary incontinence since her fall in 2012,  She complains of low back pain, neck muscle pain, bilateral shoulder achy pain.  CT cervical spine showed multilevel degenerative disc and joint disease without focal neural impingement. no change since 01/06/2012. athritis is most  prominent at C3-4 through C5-6 on the left  She lives at home by herself prior to hospital admission in Nov 2014.   UPDATE 10/05/2013:  She was discharged to NH for rehab since early Nov, 2014 following a fall, hospital admission, she fell again at nursing home with left eye bruise, she also complains of urinary urgency, occasionally incontinence for one year.  She is with her sister today, we have reviewed CAT scan together, there was moderate brain atrophy, extensive periventricular white matter disease, cervical spine has demonstrated multilevel degenerative disc disease, but no significant canal stenosis, she has T1 compression fracture, which is new  compared to previous scan in September, CT thoracic in September 2014 showed slight compression fractures of T3 and T8. These appear to be benign osteoporotic fractures. There is no protrusion of bone, disc, or hemorrhage in the spinal canal at the site of the fractures.  She has worsening gait difficulty, sister reported that she tends leaning backwards, with bilateral upgoing toes when first initiated her gait,   REVIEW OF SYSTEMS: Full 14 system review of systems performed and notable only for Weight loss, murmur, easy bruising, easy bleeding, shortness of breath, spinning sensation,incontinence, joints pain, urinary incontinence  ALLERGIES: Allergies  Allergen Reactions  . Calan (Verapamil Hcl) Other (See Comments)    Patient states it makes her "out of her mind"; bp bottoms out  . Crestor (Rosuvastatin Calcium) Other (See Comments)    Muscle pain  . Escitalopram Oxalate Other (See Comments)    Unknown   . Lipitor (Atorvastatin Calcium) Other (See Comments)    myalgia  . Lopressor (Metoprolol Tartrate) Other (See Comments)    Blood pressure bottoms out   . Norvasc (Amlodipine Besylate) Other (See Comments)    fatigue  . Nsaids   . Prednisone     SWELLING  . Sulfa Antibiotics Nausea Only and Other (See Comments)    Makes her stomach hurt  . Vimovo (Naproxen-Esomeprazole)   . Penicillins Hives and Rash    HOME MEDICATIONS: Outpatient Prescriptions Prior to Visit  Medication Sig Dispense Refill  . aspirin 81 MG tablet Take 81 mg by mouth daily.      . bisoprolol (ZEBETA) 10 MG tablet Take 10 mg by mouth daily.      . budesonide-formoterol (SYMBICORT) 160-4.5 MCG/ACT inhaler Inhale 2 puffs into the lungs 2 (two) times daily.  1 Inhaler  0  . buPROPion (WELLBUTRIN XL) 300 MG 24 hr tablet Take 300 mg by mouth daily.      . calcium carbonate (TUMS) 500 MG chewable tablet Chew 1 tablet by mouth daily.      [name] conjugated estrogens (PREMARIN) vaginal cream Place 0.625 g vaginally  daily as needed. For vaginal dryness      . diltiazem (CARDIZEM CD) 120 MG 24 hr capsule Take 120 mg by mouth 2 (two) times daily.       [name] diltiazem (CARDIZEM CD) 120 MG 24 hr capsule TAKE ONE CAPSULE TWICE A DAY  180 capsule  3  . famotidine (PEPCID) 20 MG tablet Take 20 mg by mouth 2 (two) times daily as needed. For GERD      . fluticasone (FLONASE) 50 MCG/ACT nasal spray Place 2 sprays into the nose daily.  16 g  3  . furosemide (LASIX) 40 MG tablet Take 40 mg by mouth 2 (two) times daily.       [name] gabapentin (NEURONTIN) 300 MG capsule Take 300-600 mg by mouth 4 (four) times daily. Takes 1 tablet in the am, 2 dinner and 2 at bedtime      . HYDROcodone-acetaminophen (NORCO) 10-325 MG per tablet Take 1 tablet by mouth every 6 (six) hours as needed. For pain      . LORazepam (ATIVAN) 0.5 MG tablet Take 0.5 mg by mouth every 6 (six) hours as needed. For anxiety      . losartan (COZAAR) 100 MG tablet Take 1 tablet (100 mg total) by mouth daily.  30 tablet  3  . metFORMIN (GLUCOPHAGE) 500 MG tablet Take 500 mg by mouth 2 (two) times daily.       . mirtazapine (REMERON) 45 MG tablet Take 45 mg by mouth at bedtime.      Docia Barrier IN Inhale into the lungs as directed.      . potassium chloride SA (K-DUR,KLOR-CON) 20 MEQ tablet Take 20 mEq by mouth daily.       [name] PROAIR HFA 108 (90 BASE) MCG/ACT inhaler Inhale 2 puffs into the lungs every 6 (six) hours as needed. For shortness of breath/wheezing      . sertraline (ZOLOFT) 100 MG tablet Take 200 mg by mouth daily.       . simvastatin (ZOCOR) 10 MG tablet Take 1 tablet (10 mg total) by mouth at bedtime.  90 tablet  1  . temazepam (RESTORIL) 7.5 MG capsule Take 7.5 mg by mouth at bedtime as needed. Insomnia       No facility-administered medications prior to visit.    PAST MEDICAL HISTORY: Past Medical History  Diagnosis Date  . Hypertrophic cardiomyopathy   . Hypertension   . Osteoarthritis   . Situational mixed anxiety and depressive  disorder   . Hypercholesterolemia   . Pacemaker 1998  . Falls frequently     coumadin stopped, now on ASA 81mg   . Pulmonary HTN     has had prior right heart cath in 2010; was felt that most likely due to elevated left sided pressures and would not benefit from vasodilator therapy  . Diabetes mellitus   . Oxygen dependent   . COPD (chronic obstructive pulmonary disease)   . TIA (transient ischemic attack)   . Atrial fibrillation   . CHF (congestive heart failure)     PAST SURGICAL HISTORY: Past Surgical History  Procedure Laterality Date  . Knee arthroplasty  2011    right knee  .  Tonsillectomy    . Appendectomy    . Cardiac catheterization    . Insert / replace / remove pacemaker    . Other surgical history      hysterectomy-bening    FAMILY HISTORY: Family History  Problem Relation Age of Onset  . Other Father     died in plane crash  . Cancer Mother     lymphoma-NHL  . Coronary artery disease Grandchild   . Heart disease Maternal Grandmother     SOCIAL HISTORY: Social History  . Marital Status: Widowed    Spouse Name: N/A    Number of Children: 2  . Years of Education: high school   Occupational History    Retired as Lawyer   Social History Main Topics  . Smoking status: Former Smoker -- 1.00 packs/day for 40 years    Types: Cigarettes    Quit date: 11/15/2005  . Smokeless tobacco: Never Used  . Alcohol Use: No  . Drug Use: No  . Sexually Active: No    Social History Narrative   Former smoker   Orthoptist and part-time since married. Patient is widowed.      Daughter- Alveda Reasons [phone]      Right handed.  Patient has high school education.       PHYSICAL EXAM    Filed Vitals:   05/24/13 1452  BP: 106/53  Pulse: 61  Height: 5\' 4"  (1.626 m)  Weight: 146 lb (66.225 kg)   Body mass index is 21.79 kg/(m^2).   Generalized: In no acute distress  Neck: Supple, no carotid bruits   Cardiac: Regular rate rhythm  Pulmonary:  Clear to auscultation bilaterally  Musculoskeletal: No deformity  Neurological examination  Mentation: Alert oriented to time, place, history taking, and causual conversation, wearing oxygen tank.  Cranial nerve II-XII: Pupils were equal round reactive to light extraocular movements were full, visual field were full on confrontational test. facial sensation and strength were normal. hearing was intact to finger rubbing bilaterally. Uvula tongue midline.  head turning and shoulder shrug and were normal and symmetric.Tongue protrusion into cheek strength was normal.  Motor: normal tone, bulk and strength.  Sensory: Intact to fine touch, pinprick, preserved vibratory sensation, and proprioception at toes.  Coordination: Normal finger to nose, heel-to-shin bilaterally there was no truncal ataxia  Gait: Rising up from seated position by pushing on chair arm, wide based, cautious, leaning backwards, bilateral upper going toes, very unsteady.    Deep tendon reflexes: Brachioradialis 2/2 biceps 2/2 triceps 2/2, patellar 3/3, Achilles 2/2, plantar responses were extensor bilaterally.   DIAGNOSTIC DATA (LABS, IMAGING, TESTING) - I reviewed patient records, labs, notes, testing and imaging myself where available.  Lab Results  Component Value Date   WBC 8.7 10/01/2013   HGB 14.6 10/01/2013   HCT 41.2 10/01/2013   MCV 95.4 10/01/2013   PLT 227 10/01/2013      Component Value Date/Time   NA 134* 10/01/2013 1834   K 3.6 10/01/2013 1834   CL 96 10/01/2013 1834   CO2 24 10/01/2013 1834   GLUCOSE 155* 10/01/2013 1834   BUN 17 10/01/2013 1834   CREATININE 0.82 10/01/2013 1834   CALCIUM 9.9 10/01/2013 1834   PROT 7.4 08/05/2013 2018   ALBUMIN 4.3 08/05/2013 2018   AST 45* 08/05/2013 2018   ALT 13 08/05/2013 2018   ALKPHOS 64 08/05/2013 2018   BILITOT 0.8 08/05/2013 2018   GFRNONAA 68* 10/01/2013 1834   GFRAA 79* 10/01/2013 1834  Lab Results  Component Value Date   CHOL 154 08/05/2013    HDL 52 08/05/2013   LDLCALC 78 08/05/2013   TRIG 122 08/05/2013   CHOLHDL 3.0 08/05/2013   Lab Results  Component Value Date   HGBA1C 6.4* 08/05/2013   No results found for this basename: VITAMINB12   Lab Results  Component Value Date   TSH 1.152 08/05/2013      ASSESSMENT AND PLAN  77 years old right-handed Caucasian female, with complicated past medical history, including COPD, oxygen dependent, atrial fibrillation, s/p pacemaker, not a candidate for MRI, on aspirin 81 mg a day, hypertension, hyperlipidemia, presenting with gait difficulty, hyperreflexiabilateral Babinski signs ,  on examination,  1. Her gait difficulty are multifactories including deconditioning, aging,  Periventricular white matter disease, there was no evidence of cervical cord compression on CAT scan of cervical, but there is new T1 compression fracture, she had rapid worsening gait difficulty, profound hyper reflexia of bilateral lower extremity, bilateral Babinski signs, urinary incontinence, we will repeat CAT scan of thoracic spine to rule out thoracic canal stenosis. 2 continue physical therapy.   3. RTCIn 2 months     Levert Feinstein, M.D. Ph.D.  [facility] [address] [place], [place] [address] [phone]

## 2013-10-11 ENCOUNTER — Telehealth: Payer: Self-pay | Admitting: Neurology

## 2013-10-12 ENCOUNTER — Ambulatory Visit
Admission: RE | Admit: 2013-10-12 | Discharge: 2013-10-12 | Disposition: A | Discharge status: Home / Self Care | Payer: Medicare Other | Source: Ambulatory Visit | Attending: Neurology | Admitting: Neurology

## 2013-10-12 ENCOUNTER — Other Ambulatory Visit: Payer: Self-pay | Admitting: Neurology

## 2013-10-12 ENCOUNTER — Inpatient Hospital Stay: Admission: RE | Admit: 2013-10-12 | Payer: Medicare Other | Source: Ambulatory Visit

## 2013-10-12 DIAGNOSIS — R269 Unspecified abnormalities of gait and mobility: Secondary | ICD-10-CM

## 2013-10-12 DIAGNOSIS — Z950 Presence of cardiac pacemaker: Secondary | ICD-10-CM

## 2013-10-14 ENCOUNTER — Encounter: Payer: Self-pay | Admitting: Nurse Practitioner

## 2013-10-14 ENCOUNTER — Non-Acute Institutional Stay (SKILLED_NURSING_FACILITY): Payer: Medicare Other | Admitting: Nurse Practitioner

## 2013-10-14 DIAGNOSIS — I422 Other hypertrophic cardiomyopathy: Secondary | ICD-10-CM

## 2013-10-14 DIAGNOSIS — I509 Heart failure, unspecified: Secondary | ICD-10-CM

## 2013-10-14 DIAGNOSIS — G629 Polyneuropathy, unspecified: Secondary | ICD-10-CM

## 2013-10-14 DIAGNOSIS — I4891 Unspecified atrial fibrillation: Secondary | ICD-10-CM

## 2013-10-14 DIAGNOSIS — W108XXA Fall (on) (from) other stairs and steps, initial encounter: Secondary | ICD-10-CM

## 2013-10-14 DIAGNOSIS — R2681 Unsteadiness on feet: Secondary | ICD-10-CM

## 2013-10-14 DIAGNOSIS — G609 Hereditary and idiopathic neuropathy, unspecified: Secondary | ICD-10-CM

## 2013-10-14 DIAGNOSIS — W102XXA Fall (on)(from) incline, initial encounter: Secondary | ICD-10-CM

## 2013-10-14 DIAGNOSIS — R269 Unspecified abnormalities of gait and mobility: Secondary | ICD-10-CM

## 2013-10-14 NOTE — Progress Notes (Signed)
Date: 10/14/2013  MRN:  [id number] Name:  [name] [name] Sex:  female Age:  77 y.o. DOB:17-Aug-1936   PSC #:                       Facility/Room;  [name] [place] [address] Level Of Care:  SNF  Emergency Contacts: Contact Information   Name Relation Home Work [place] Daughter [phone]  [phone]   [name],[name] Daughter   [phone]   Pegram,Cricket Sister [phone]  [phone]     Code Status:  Full code  Allergies: Allergies  Allergen Reactions  . Calan [Verapamil Hcl] Other (See Comments)    Patient states it makes her "out of her mind"; bp bottoms out  . Crestor [Rosuvastatin Calcium] Other (See Comments)    Muscle pain  . Escitalopram Oxalate Other (See Comments)    Unknown   . Lipitor [Atorvastatin Calcium] Other (See Comments)    myalgia  . Lopressor [Metoprolol Tartrate] Other (See Comments)    Blood pressure bottoms out   . Nitroglycerin   . Norvasc [Amlodipine Besylate] Other (See Comments)    fatigue  . Nsaids Nausea Only  . Oxycodone   . Prednisone     SWELLING  . Sulfa Antibiotics Nausea Only and Other (See Comments)    Makes her stomach hurt  . Vimovo [Naproxen-Esomeprazole] Nausea Only  . Penicillins Hives and Rash     Chief Complaint  Patient presents with  . Discharge Note   HPI:  Patient is a 77 year old Caucasian female scheduled for discharge on 10/15/2013. Her medical comorbidities include gait abnormality, history of frequent falls, hypertrophic cardiomyopathy, COPD, oxygen dependent, pulmonary hypertension. She's had multiple falls most recently requiring sutures to her left forehead, from a fall which occurred on 10/01/2013.  Past Medical History  Diagnosis Date  . Hypertrophic cardiomyopathy   . Hypertension   . Situational mixed anxiety and depressive disorder   . Hypercholesterolemia   . Falls frequently     coumadin stopped  . Pulmonary HTN     has had prior right heart cath in 2010; was felt that most likely  due to elevated left sided pressures and would not benefit from vasodilator therapy  . Oxygen dependent     "3L 24/7" (08/05/2013)  . COPD (chronic obstructive pulmonary disease)   . TIA (transient ischemic attack)   . Atrial fibrillation     Chronic; No longer on coumadin  . CHF (congestive heart failure)   . Pacemaker   . Family history of anesthesia complication     "they have to breath for mom for awhile after anesthesia" (08/05/2013)  . Heart murmur   . Varicose veins   . Pneumonia     "couple times; long time ago" (08/05/2013)  . Exertional shortness of breath   . Type II diabetes mellitus   . Anemia     "as a child" (08/05/2013)  . GERD (gastroesophageal reflux disease)   . H/O hiatal hernia   . Osteoarthritis   . Arthritis     "all over me; in all my joints" (08/05/2013)  . Coccyx pain   . Gout     "right foot" (08/05/2013)  . Anxiety   . Depression     Past Surgical History  Procedure Laterality Date  . Total knee arthroplasty Right 2011  . Tonsillectomy    . Appendectomy    . Abdominal hysterectomy      "partial" (08/05/2013)  . Dilation and curettage of  uterus      "several" (08/05/2013)  . Foot neuroma surgery Right   . Insert / replace / remove pacemaker  1998; 2000's; 2014  . Cardiac catheterization      "more than once" (08/05/2013)  . Back surgery      "bone spur removed; mid back" (08/05/2013)    Consultants:   Dr. Patty Sermons, Cardiology at [facility] Also followed by Neurology, CT scan showed white matter atrophy  Current Outpatient Prescriptions  Medication Sig Dispense Refill  . acetaminophen (MAPAP) 325 MG tablet Take 650 mg by mouth every 6 (six) hours as needed.      [name] aluminum & magnesium hydroxide-simethicone (MYLANTA) 500-450-40 MG/5ML suspension Take 10 mLs by mouth every 6 (six) hours as needed for indigestion.      [name] aspirin EC 81 MG EC tablet Take 1 tablet (81 mg total) by mouth daily.      . bisacodyl (DULCOLAX) 10 MG suppository Place 1  suppository (10 mg total) rectally as needed for constipation.  12 suppository  0  . BISOPROLOL FUMARATE PO Take 10 mg by mouth daily.      [name] buPROPion (WELLBUTRIN XL) 300 MG 24 hr tablet Take 300 mg by mouth daily.      [name] conjugated estrogens (PREMARIN) vaginal cream Place 0.625 g vaginally daily as needed. For vaginal dryness      . diltiazem (CARDIZEM CD) 240 MG 24 hr capsule Take 240 mg by mouth daily.      . famotidine (PEPCID) 20 MG tablet Take 20 mg by mouth 2 (two) times daily as needed. For GERD      . fluticasone (FLONASE) 50 MCG/ACT nasal spray Place 2 sprays into both nostrils as needed for rhinitis or allergies.      . furosemide (LASIX) 40 MG tablet Take 40 mg by mouth daily.      [name] gabapentin (NEURONTIN) 300 MG capsule Take 300 mg by mouth 3 (three) times daily.       [name] HYDROcodone-acetaminophen (NORCO) 7.5-325 MG per tablet Take 1 tablet by mouth every 6 (six) hours as needed for pain.  120 tablet  0  . LORazepam (ATIVAN) 0.5 MG tablet Take 0.5 mg by mouth every 8 (eight) hours as needed for anxiety.      [name] losartan (COZAAR) 50 MG tablet Take 1 tablet (50 mg total) by mouth daily.  30 tablet  3  . methocarbamol (ROBAXIN) 500 MG tablet Take 1 tablet (500 mg total) by mouth 3 (three) times daily.  60 tablet  0  . oxybutynin (OXYTROL) 3.9 MG/24HR Place 1 patch onto the skin every 3 (three) days.      . potassium chloride SA (K-DUR,KLOR-CON) 20 MEQ tablet Take 20 mEq by mouth 2 (two) times daily.       [name] PROAIR HFA 108 (90 BASE) MCG/ACT inhaler Inhale 2 puffs into the lungs every 6 (six) hours as needed. For shortness of breath/wheezing      . senna (SENOKOT) 8.6 MG TABS tablet Take 1 tablet (8.6 mg total) by mouth 2 (two) times daily.  30 each  0  . sertraline (ZOLOFT) 100 MG tablet Take 200 mg by mouth daily.       . simvastatin (ZOCOR) 10 MG tablet Take 10 mg by mouth at bedtime.      . temazepam (RESTORIL) 7.5 MG capsule Take 1 capsule (7.5 mg total) by mouth at bedtime as needed  for sleep or anxiety. Insomnia  20 capsule  0  No current facility-administered medications for this visit.    Immunization History  Administered Date(s) Administered  . Influenza Split 09/11/2012  . Influenza Whole 10/15/2010  . Pneumococcal Polysaccharide-23 10/15/2010  . Tdap 10/01/2013     Diet: No added salt  History  Substance Use Topics  . Smoking status: Former Smoker -- 1.00 packs/day for 40 years    Types: Cigarettes    Quit date: 11/15/2005  . Smokeless tobacco: Never Used  . Alcohol Use: No    Family History  Problem Relation Age of Onset  . Other Father     died in plane crash  . Cancer Mother     lymphoma-NHL  . Coronary artery disease Grandchild   . Heart disease Maternal Grandmother      Review of Systems  Constitutional: Negative for fever and chills.  HENT: Negative for hearing loss, nosebleeds, sore throat and tinnitus.   Eyes: Negative for double vision, photophobia, discharge and redness.  Respiratory: Positive for shortness of breath. Negative for hemoptysis and sputum production.   Cardiovascular: Negative for palpitations, claudication and PND.  Gastrointestinal: Positive for heartburn. Negative for blood in stool and melena.  Genitourinary: Negative for hematuria and flank pain.  Musculoskeletal: Positive for back pain and falls.  Skin: Negative for itching and rash.  Neurological: Positive for weakness. Negative for seizures and loss of consciousness.  Endo/Heme/Allergies: Positive for environmental allergies. Negative for polydipsia. Bruises/bleeds easily.  Psychiatric/Behavioral: Positive for depression. Negative for hallucinations and substance abuse.   Recent labs Urine culture done 09/08/2013 was negative Cervical spine film done 09/13/2013 showed moderate to severe diffuse osteopenia without osteoarthritis Moderate vertebral body compression deformity at T8 with 60% loss of body height, stable finding Mild vertebral body  compression deformity P3, with 40% loss of body height unchanged No subluxation, dislocation, or lytic destructive lesion. This study was done after a fall.  Cervical spine film done 09/14/2013 Moderate to severe diffuse osteopenia unchanged No acute fracture subluxation, dislocation, or destructive lytic lesion Study done after a fall.  10/01/2013 Hemoglobin A1c 5.8 Complete blood count done 08/13/2013 WBC 7.2 RBC 3.9 Hemoglobin 12.6 Hematocrit 38.6 MCV 100 MCH 32.6 MCHC 32.6 RDW 13.2 Platelets 218 Differential otherwise unremarkable. Sodium 133 Potassium 3.7 Chloride 95 CO2 29 A gap 9 Glucose 169 BUN 22 Creatinine 0.9 Calcium 9.8  Total protein 7.1 Albumin 4.5 Globulin 2.6 Total bilirubin 1.0 Direct bilirubin 0.2 ALP 64 AST 40 ALT 14  Vital signs: BP 137/72  Pulse 82  Temp(Src) 96.8 F (36 C)  Resp 18  SpO2 95%  Physical Exam  Constitutional: She is oriented to person, place, and time. No distress.  HENT:  Head: Normocephalic.  Right Ear: External ear normal.  Left Ear: External ear normal.  Mouth/Throat: Oropharynx is clear and moist. No oropharyngeal exudate.  Eyes: EOM are normal. Pupils are equal, round, and reactive to light. Right eye exhibits no discharge. Left eye exhibits no discharge. No scleral icterus.  Neck: No JVD present. No tracheal deviation present. No thyromegaly present.  Cardiovascular: Exam reveals no gallop and no friction rub.   Murmur heard. Pulmonary/Chest: Breath sounds normal. No stridor. No respiratory distress. She exhibits no tenderness.  Abdominal: Soft. Bowel sounds are normal. She exhibits no mass. There is no tenderness. There is no rebound.  Musculoskeletal: She exhibits tenderness. She exhibits no edema.  Lymphadenopathy:    She has no cervical adenopathy.  Neurological: She is alert and oriented to person, place, and time. Coordination abnormal.  Skin: Skin  is warm and dry. No rash noted. She is not diaphoretic.   Psychiatric: Mood and affect normal.  Pt reports that she will not be by herself during the day when discharged.      Addendum to physical examination: Patient is able to do sequential subtraction, without difficulty. Patient is a frail elderly female in no acute distress. She has multiple ecchymotic areas typically across the left side. On her left lateral forehead is a vertical incision which still has sutures. There is no surrounding erythema, drainage, or increased warmth. The area is warm and dry. Her skin changes are consistent with her long history of cigarette smoking and intermittent use of corticosteroids related to her COPD. Her posterior tibial pulses are easily palpable.  Assessment/plan: Congestive heart failure, we'll certainly continue her Cardizem CD 240 mg a day, Lasix 40 mg per day, Cozaar 50 mg a day. She will certainly need to be followed by her cardiologist. Coronary artery disease, we'll certainly need to continue her aspirin at 81 mg a day and her simvastatin 10 mg per day. COPD continue her throat air 2 puffs inhaled twice a day to maintain optimal bronchodilation. She will also continue her Symbicort 160/12.5 to address any potential pulmonary inflammation. We'll certainly continue Oxytrol transdermal, 3.9 mg, to address her overactive bladder. Depression, certainly continue her Zoloft to 200 mg per day and her Wellbutrin extended release 300 mg per day. Back pain, related to her osteopenia and osteoarthritis, patient will continue fentanyl patch, provided, and Vicodin only as needed.  Neurontin will be an ongoing scheduled dose at 300 mg 3 times a day. Consultation will continue her Senokot and Dulcolax suppository as necessary. GERD certainly continue her Pepcid 20 mg twice a day.   Gait abnormality, with white matter atrophy as evidenced on CT scan. Patient is to be discharged, she will have physical and occupational therapy. Reports will be to facilitate ongoing  strength building, and safety in mobility. The patient states she will be living with her sister, and someone will be with her during the day. Concern related to patient's judgment and getting up without assistance, and not using her walker which has been the cause of her falls. Patient will need ongoing followup with her neurologist.

## 2013-10-18 NOTE — Progress Notes (Signed)
Quick Note:  Please call patient, healing T3, T8 compression fracture, No retropulsion or canal compromise.  New superior endplate fractures of T1 and T2 without significant compression or retropulsion ______

## 2013-10-20 NOTE — Progress Notes (Signed)
Quick Note:  Spoke to patient and relayed CT thoracic spine results, per Dr. Terrace Arabia. Told her doctor would review Ct on her follow up visit in February. ______

## 2013-10-24 NOTE — Progress Notes (Signed)
This encounter was created in error - please disregard.

## 2013-10-25 ENCOUNTER — Ambulatory Visit (INDEPENDENT_AMBULATORY_CARE_PROVIDER_SITE_OTHER): Payer: Medicare Other | Admitting: Cardiology

## 2013-10-25 ENCOUNTER — Encounter: Payer: Self-pay | Admitting: Cardiology

## 2013-10-25 VITALS — BP 140/70 | HR 60 | Ht 64.0 in | Wt 130.0 lb

## 2013-10-25 DIAGNOSIS — R55 Syncope and collapse: Secondary | ICD-10-CM

## 2013-10-25 DIAGNOSIS — I422 Other hypertrophic cardiomyopathy: Secondary | ICD-10-CM

## 2013-10-25 DIAGNOSIS — I10 Essential (primary) hypertension: Secondary | ICD-10-CM

## 2013-10-25 DIAGNOSIS — W19XXXA Unspecified fall, initial encounter: Secondary | ICD-10-CM

## 2013-10-25 DIAGNOSIS — W19XXXD Unspecified fall, subsequent encounter: Secondary | ICD-10-CM

## 2013-10-25 DIAGNOSIS — Z9181 History of falling: Secondary | ICD-10-CM

## 2013-10-25 DIAGNOSIS — R296 Repeated falls: Secondary | ICD-10-CM

## 2013-10-25 DIAGNOSIS — I119 Hypertensive heart disease without heart failure: Secondary | ICD-10-CM

## 2013-10-25 DIAGNOSIS — I4891 Unspecified atrial fibrillation: Secondary | ICD-10-CM

## 2013-10-25 NOTE — Progress Notes (Signed)
[name] [name] Date of Birth:  [birth year] [address] [place], [place]  [address] [phone]         Fax   [phone]  History of Present Illness: This pleasant 77 year old Caucasian female is seen for a followup office visit.  She states that for the past 2 months she has either been in the hospital or in a rehabilitation center.  She returned home only one week ago.  She was in the hospital because of frequent falls.  She does not have any warning before she falls.  She states that she just appears to lose her balance.  On one occasion she did hit her head and had a prior sutures over her left eyebrow.  She was also told that she had a concussion.  Although she is in atrial fibrillation she is not on anticoagulation because of her frequent falls. She has a history of chronic atrial fibrillation.  She has a pacemaker in place.  She has known LVH.  She had an echocardiogram on 03/01/13 and showed severe pulmonary hypertension with PA pressure 82 mmHg. her most recent echocardiogram of 08/06/13 showed an ejection fraction of 60-65% and her pulmonary artery pressure was 71 mmHg Her left ventricular ejection fraction was 60-65% and there was mild LVH.  She has a past history of hypertrophic cardiomyopathy although this was not demonstrated on her most recent echo.  In the hospital she was felt to be dehydrated and her Lasix was held.  It was subsequently restarted at 40 mg daily.  Current Outpatient Prescriptions  Medication Sig Dispense Refill  . acetaminophen (MAPAP) 325 MG tablet Take 650 mg by mouth every 6 (six) hours as needed.      [name] aluminum & magnesium hydroxide-simethicone (MYLANTA) 500-450-40 MG/5ML suspension Take 10 mLs by mouth every 6 (six) hours as needed for indigestion.      [name] aspirin EC 81 MG EC tablet Take 1 tablet (81 mg total) by mouth daily.      [name] BISOPROLOL FUMARATE PO Take 10 mg by mouth daily.      [name] buPROPion (WELLBUTRIN XL) 300 MG 24 hr tablet Take 300 mg  by mouth daily.      [name] conjugated estrogens (PREMARIN) vaginal cream Place 0.625 g vaginally daily as needed. For vaginal dryness      . diltiazem (CARDIZEM CD) 240 MG 24 hr capsule Take 240 mg by mouth daily.      . famotidine (PEPCID) 20 MG tablet Take 20 mg by mouth 2 (two) times daily as needed. For GERD      . fluticasone (FLONASE) 50 MCG/ACT nasal spray Place 2 sprays into both nostrils as needed for rhinitis or allergies.      . furosemide (LASIX) 40 MG tablet Take 40 mg by mouth daily.      [name] gabapentin (NEURONTIN) 300 MG capsule Take 300 mg by mouth 3 (three) times daily.       [name] HYDROcodone-acetaminophen (NORCO) 7.5-325 MG per tablet Take 1 tablet by mouth every 6 (six) hours as needed for pain.  120 tablet  0  . LORazepam (ATIVAN) 0.5 MG tablet Take 0.5 mg by mouth every 8 (eight) hours as needed for anxiety.      [name] losartan (COZAAR) 50 MG tablet Take 1 tablet (50 mg total) by mouth daily.  30 tablet  3  . methocarbamol (ROBAXIN) 500 MG tablet Take 1 tablet (500 mg total) by mouth 3 (three) times daily.  60 tablet  0  . potassium chloride SA (K-DUR,KLOR-CON) 20 MEQ tablet Take 20 mEq by mouth 2 (two) times daily.       [name] PROAIR HFA 108 (90 BASE) MCG/ACT inhaler Inhale 2 puffs into the lungs every 6 (six) hours as needed. For shortness of breath/wheezing      . senna (SENOKOT) 8.6 MG TABS tablet Take 1 tablet (8.6 mg total) by mouth 2 (two) times daily.  30 each  0  . sertraline (ZOLOFT) 100 MG tablet Take 200 mg by mouth daily.       . simvastatin (ZOCOR) 10 MG tablet Take 10 mg by mouth at bedtime.      . temazepam (RESTORIL) 7.5 MG capsule Take 1 capsule (7.5 mg total) by mouth at bedtime as needed for sleep or anxiety. Insomnia  20 capsule  0   No current facility-administered medications for this visit.    Allergies  Allergen Reactions  . Calan [Verapamil Hcl] Other (See Comments)    Patient states it makes her "out of her mind"; bp bottoms out  . Crestor [Rosuvastatin  Calcium] Other (See Comments)    Muscle pain  . Escitalopram Oxalate Other (See Comments)    Unknown   . Lipitor [Atorvastatin Calcium] Other (See Comments)    myalgia  . Lopressor [Metoprolol Tartrate] Other (See Comments)    Blood pressure bottoms out   . Nitroglycerin   . Norvasc [Amlodipine Besylate] Other (See Comments)    fatigue  . Nsaids Nausea Only  . Oxycodone   . Prednisone     SWELLING  . Sulfa Antibiotics Nausea Only and Other (See Comments)    Makes her stomach hurt  . Vimovo [Naproxen-Esomeprazole] Nausea Only  . Penicillins Hives and Rash    Patient Active Problem List   Diagnosis Date Noted  . Gait difficulty 10/05/2013  . Fall (on)(from) incline, initial encounter 10/03/2013  . Essential hypertension, benign 10/03/2013  . Unsteady gait 10/03/2013  . Peripheral neuropathy 10/03/2013  . Back pain 08/06/2013  . Near syncope 08/06/2013  . Pulmonary hypertension 08/05/2013  . Abnormality of gait 05/24/2013  . Acute respiratory distress 05/08/2013  . Pulmonary edema, acute 05/08/2013  . Benign hypertensive heart disease without heart failure   . Pulmonary HTN   . Oxygen dependent   . COPD (chronic obstructive pulmonary disease)   . Fatigue due to cardiopulmonary and deconditioning 04/23/2013  . Memory deficit 02/07/2013  . Memory change 12/11/2012  . TIA (transient ischemic attack) 02/19/2012  . Chest pain at rest 08/05/2011  . Type II or unspecified type diabetes mellitus without mention of complication, uncontrolled 06/12/2011  . Fall 03/22/2011  . Mitral regurgitation 03/13/2011  . Hypertrophic cardiomyopathy   . Osteoarthritis   . Situational mixed anxiety and depressive disorder   . Hypercholesterolemia   . Pacemaker 02/26/2011  . Atrial fibrillation 01/28/2011  . CHRONIC OBSTRUCTIVE PULMONARY DISEASE, MODERATE 05/23/2010  . DEPRESSIVE DISORDER NOT ELSEWHERE CLASSIFIED 04/11/2010  . HYPERLIPIDEMIA 05/31/2009  . HYPERTENSION 05/31/2009  .  ALLERGIC RHINITIS 05/31/2009  . SHORTNESS OF BREATH 05/31/2009  . Cough 05/31/2009    History  Smoking status  . Former Smoker -- 1.00 packs/day for 40 years  . Types: Cigarettes  . Quit date: 11/15/2005  Smokeless tobacco  . Never Used    History  Alcohol Use No    Family History  Problem Relation Age of Onset  . Other Father     died in plane crash  . Cancer Mother  lymphoma-NHL  . Coronary artery disease Grandchild   . Heart disease Maternal Grandmother     Review of Systems: Constitutional: no fever chills diaphoresis or fatigue or change in weight.  Head and neck: no hearing loss, no epistaxis, no photophobia or visual disturbance. Respiratory: No cough, shortness of breath or wheezing. Cardiovascular: No chest pain peripheral edema, palpitations. Gastrointestinal: No abdominal distention, no abdominal pain, no change in bowel habits hematochezia or melena. Genitourinary: No dysuria, no frequency, no urgency, no nocturia. Musculoskeletal:No arthralgias, no back pain, no gait disturbance or myalgias. Neurological: No dizziness, no headaches, no numbness, no seizures, no syncope, no weakness, no tremors. Hematologic: No lymphadenopathy, no easy bruising. Psychiatric: No confusion, no hallucinations, no sleep disturbance.    Physical Exam: Filed Vitals:   10/25/13 1512  BP: 140/70  Pulse:    the general appearance reveals a chronically ill-appearing woman with nasal oxygen in place.  She is chronically pale.The head and neck exam reveals pupils equal and reactive.  Extraocular movements are full.  There is no scleral icterus.  The mouth and pharynx are normal.  The neck is supple.  The carotids reveal no bruits.  The jugular venous pressure is normal.  The  thyroid is not enlarged.  There is no lymphadenopathy.   Lungs reveal mild rhonchi. Expansion of the chest is symmetrical.  The precordium is quiet.  The pulse is regular and paced.  The first heart sound is  normal.  The second heart sound is physiologically split.  There is no gallop rub or click.  There is a soft systolic murmur at the left sternal  There is no abnormal lift or heave.  The abdomen is soft and nontender.  The bowel sounds are normal.  The liver and spleen are not enlarged.  There are no abdominal masses.  There are no abdominal bruits.  Extremities reveal good pedal pulses.  There is no phlebitis or edema.  There is no cyanosis or clubbing.  Strength is normal and symmetrical in all extremities.  There is no lateralizing weakness.  There are no sensory deficits.  The skin is warm and dry.  There is no rash.    Assessment / Plan: Continue same medication.  Decrease Lasix to 20 mg daily.  Continue other medicines the same.  Recheck in 2 months for office visit EKG and basal metabolic panel.

## 2013-10-25 NOTE — Assessment & Plan Note (Signed)
The patient has a history of frequent falls including hitting her hip.  She was allowed to leave the rehabilitation facility as long as she had 24-hour care at home.  She has a CNA who comes in 12 hours a day and her sister is there during the night

## 2013-10-25 NOTE — Patient Instructions (Signed)
Decrease Lasix to 20 mg daily   Your physician recommends that you schedule a follow-up appointment in: 2 months with Lab work ( bmet ) and a ekg.

## 2013-10-25 NOTE — Assessment & Plan Note (Signed)
Blood pressure is presently satisfactory at 140/70.  We will continue same medication except reduce Lasix to 20 mg daily

## 2013-10-25 NOTE — Assessment & Plan Note (Signed)
The patient complains of feeling lightheaded and dizzy.  She has been on Lasix 40 mg daily.  She has a past history of hypertrophic cardiomyopathy.  She does not have any peripheral edema now and her lungs are clear.  We will reduce her Lasix to just 20 mg daily and see if her lightheadedness improves.

## 2013-10-31 NOTE — Telephone Encounter (Signed)
Letter from [facility] that she needs o2 recert. I am going to give the  Paper work to yopu  Dr. Kalman Shan, M.D., [facility].C.P Pulmonary and Critical Care Medicine Staff Physician [facility] Pager: [phone], If no answer or between  15:00h - 7:00h: call [phone]  10/31/2013 3:32 PM

## 2013-11-02 NOTE — Telephone Encounter (Signed)
This has been taken care of, I will route message back to [facility]. Carron Curie, CMA

## 2013-11-05 ENCOUNTER — Telehealth: Payer: Self-pay | Admitting: Cardiology

## 2013-11-05 NOTE — Telephone Encounter (Signed)
New message    Question about dosage of diltiazem.

## 2013-11-09 NOTE — Telephone Encounter (Signed)
Spoke with patient and she could not remember why she had called. I have been out of the office until today.

## 2013-11-09 NOTE — Telephone Encounter (Signed)
I will forward to Dr Yevonne Pax nurse, she is in office.

## 2013-11-09 NOTE — Telephone Encounter (Signed)
Called patient and she was asleep. Will try her again later

## 2013-11-10 NOTE — Progress Notes (Signed)
Date of Visit 08/12/2013 Code status:  Full code [facility]  Patient ID: BIBIANA GILLEAN, female   DOB: 1936/07/21, 77 y.o.   MRN: [id number]  Allergies  Allergen Reactions  . Calan [Verapamil Hcl] Other (See Comments)    Patient states it makes her "out of her mind"; bp bottoms out  . Crestor [Rosuvastatin Calcium] Other (See Comments)    Muscle pain  . Escitalopram Oxalate Other (See Comments)    Unknown   . Lipitor [Atorvastatin Calcium] Other (See Comments)    myalgia  . Lopressor [Metoprolol Tartrate] Other (See Comments)    Blood pressure bottoms out   . Nitroglycerin   . Norvasc [Amlodipine Besylate] Other (See Comments)    fatigue  . Nsaids Nausea Only  . Oxycodone   . Prednisone     SWELLING  . Sulfa Antibiotics Nausea Only and Other (See Comments)    Makes her stomach hurt  . Vimovo [Naproxen-Esomeprazole] Nausea Only  . Penicillins Hives and Rash   Chief Complaint  Patient presents with  . Acute Visit   HPI:  At approx 11:30 a.m. Pt fell while trying to get in her wheelchair while on the way to the bathroom.   Now with c/o sore area at R lateral T-spine and at the occipital area.  No LOC, No actual syncope.  Pt has severe COPD, is on 2L O2, and has history of multiple falls.    Review of Systems  Constitutional: Negative for fever and chills.  HENT: Negative for ear discharge and ear pain.   Eyes: Negative for discharge and redness.  Gastrointestinal: Negative for blood in stool.  Genitourinary: Positive for urgency. Negative for hematuria and flank pain.  Musculoskeletal: Positive for back pain and falls.       Thoracic spine pain  Neurological: Positive for weakness. Negative for seizures and loss of consciousness.  Endo/Heme/Allergies: Bruises/bleeds easily.  Psychiatric/Behavioral: Negative for suicidal ideas and hallucinations.    Past Medical History  Diagnosis Date  . Hypertrophic cardiomyopathy   . Hypertension   . Situational mixed anxiety  and depressive disorder   . Hypercholesterolemia   . Falls frequently     coumadin stopped  . Pulmonary HTN     has had prior right heart cath in 2010; was felt that most likely due to elevated left sided pressures and would not benefit from vasodilator therapy  . Oxygen dependent     "3L 24/7" (08/05/2013)  . COPD (chronic obstructive pulmonary disease)   . TIA (transient ischemic attack)   . Atrial fibrillation     Chronic; No longer on coumadin  . CHF (congestive heart failure)   . Pacemaker   . Family history of anesthesia complication     "they have to breath for mom for awhile after anesthesia" (08/05/2013)  . Heart murmur   . Varicose veins   . Pneumonia     "couple times; long time ago" (08/05/2013)  . Exertional shortness of breath   . Type II diabetes mellitus   . Anemia     "as a child" (08/05/2013)  . GERD (gastroesophageal reflux disease)   . H/O hiatal hernia   . Osteoarthritis   . Arthritis     "all over me; in all my joints" (08/05/2013)  . Coccyx pain   . Gout     "right foot" (08/05/2013)  . Anxiety   . Depression    Current Outpatient Prescriptions on File Prior to Visit  Medication Sig Dispense  Refill  . aluminum & magnesium hydroxide-simethicone (MYLANTA) 500-450-40 MG/5ML suspension Take 10 mLs by mouth every 6 (six) hours as needed for indigestion.      [name] aspirin EC 81 MG EC tablet Take 1 tablet (81 mg total) by mouth daily.      [name] buPROPion (WELLBUTRIN XL) 300 MG 24 hr tablet Take 300 mg by mouth daily.      [name] conjugated estrogens (PREMARIN) vaginal cream Place 0.625 g vaginally daily as needed. For vaginal dryness      . famotidine (PEPCID) 20 MG tablet Take 20 mg by mouth 2 (two) times daily as needed. For GERD      . fluticasone (FLONASE) 50 MCG/ACT nasal spray Place 2 sprays into both nostrils as needed for rhinitis or allergies.      [name] gabapentin (NEURONTIN) 300 MG capsule Take 300 mg by mouth 3 (three) times daily.       [name] losartan (COZAAR) 50 MG  tablet Take 1 tablet (50 mg total) by mouth daily.  30 tablet  3  . methocarbamol (ROBAXIN) 500 MG tablet Take 1 tablet (500 mg total) by mouth 3 (three) times daily.  60 tablet  0  . potassium chloride SA (K-DUR,KLOR-CON) 20 MEQ tablet Take 20 mEq by mouth 2 (two) times daily.       [name] PROAIR HFA 108 (90 BASE) MCG/ACT inhaler Inhale 2 puffs into the lungs every 6 (six) hours as needed. For shortness of breath/wheezing      . senna (SENOKOT) 8.6 MG TABS tablet Take 1 tablet (8.6 mg total) by mouth 2 (two) times daily.  30 each  0  . sertraline (ZOLOFT) 100 MG tablet Take 200 mg by mouth daily.       . simvastatin (ZOCOR) 10 MG tablet Take 10 mg by mouth at bedtime.      . temazepam (RESTORIL) 7.5 MG capsule Take 1 capsule (7.5 mg total) by mouth at bedtime as needed for sleep or anxiety. Insomnia  20 capsule  0   No current facility-administered medications on file prior to visit.   Recent Diagnostic Studies: All from 08/05/2013  Acute Back pain, Thoracic spine film, 08/05/2013, mild compression fracture of T8 vertebral body.    Chest x-ray, Cardiomegaly, dual lead cardiac pacemaker is unchanged in position.  There is mild interstitial prominence bilaterally with convincing pulmonary edema.  No gross fractures are identified.  No segmental infilitrate.  No diagnostic  Pneumothorax.    CT of head without contrast, no skull fracture noted, paranasal sinuses and mastoid air cells are unremarkable.  No intracranial hemorrhage, mass effect, or midline shift.  No acute cortical infarction.  Stable cerebral atrophy.  Ventricular size is stable from previous studies.  Stable periventricular and patchy subcortical chronic white disease.    CT of cervical spine, no acute fracture or subluxation.  Stable degenerative changes, C-1-C-2 articulation.  Disc space flattening with mild anterior and minimal posterior spurring at C5-C6.  Minimal disc space flattening at C7-T1.  No prevertebreal soft tissue swelling.      Recent Labs:   08/08/2013 Na 139 K 3.8 Cl 99 CO2 32 Glucose 150 BUN 15 Creatinine 0.73 Calcium 9.5 Mg 1.6  08/05/2013 AST 45 ALT 13 Alk Phos 64 Bili tot 0.8 Protein 7.4 Albumin 4.3  WBC 7 Neutroabs 5.5 HGB 14 HCT 39.8 MCV 96.4 PLT 183  Vital Signs: Pulse 88, RR 20, BP 156/84, O2 95%, on 2 L O2 Height 5'4"  Weight 140 pounds  Physical  Examination:    Head is normocephalic, Occipital area with small area of swelling and tender to palpation.   Pupils RERRLA, EOMI No palpable adenopathy or thyromegaly AP intermittently irregular, otherwise unremarkable BBrS are entirely clear Spine is positive for kyphoscolosis, with area of small swelling on the thoracic area, lateral to the spine with no acute response to palpation.    Assessment/Plan:  Fall, pt needs PT eval for safety and mobility.  Will consider obtaining x-rays if pain or swelling persists.  Call bell to be kept in close proximity to the pt, and re-instructed not to stand or attempt to ambulate without assistance.    Will obtain baseline labs  COPD continue O2 at 2L and Proair HFA inhaler 2 puffs inhaled every 6 hours as needed for SOB or wheezing.   Will continue to monitor  Atrial fibrillation/CHF. cotinue losartan 50 mg once a day, and ASA 81 mg per day.  Will continue to monotor  Depression,  Continue the zolofr 200 mg each day, and the Wellbutrin XL 300mg  each day.  Continue to monitor  Insomnia, Restoril 7.5 mg at bedtime  GERD, continue Pepcid 20 mg twice a day  Pain, continue neurontin 300 mg three times a day, Robaxin 500 mg 1 every 8 hours.  Will continue to monitor.  Supplementation, continue KCl at  20 meq twice a day, will continue to monitor  Constipation, continue Senekot twice a day  Urinary urgency, will send urine for analysis and C&S.  Will add Oxytrol transdermal.  Will monitor all pt's meds to have ongoig assessment r/t any co-relationship to potential falling.  Will adjust  as necessary.

## 2013-11-12 NOTE — Progress Notes (Addendum)
Date of Visit 08/18/2013 Code status:  Full code [name]  Patient ID: [name] [name], female   DOB: Apr 07, 1936, 78 y.o.   MRN: [id number]  Allergies  Allergen Reactions  . Calan [Verapamil Hcl] Other (See Comments)    Patient states it makes her "out of her mind"; bp bottoms out  . Crestor [Rosuvastatin Calcium] Other (See Comments)    Muscle pain  . Escitalopram Oxalate Other (See Comments)    Unknown   . Lipitor [Atorvastatin Calcium] Other (See Comments)    myalgia  . Lopressor [Metoprolol Tartrate] Other (See Comments)    Blood pressure bottoms out   . Nitroglycerin   . Norvasc [Amlodipine Besylate] Other (See Comments)    fatigue  . Nsaids Nausea Only  . Oxycodone   . Prednisone     SWELLING  . Sulfa Antibiotics Nausea Only and Other (See Comments)    Makes her stomach hurt  . Vimovo [Naproxen-Esomeprazole] Nausea Only  . Penicillins Hives and Rash   Chief Complaint  Patient presents with  . Acute Visit   History of Present Illness:  Concern verbalized by nursing staff this a.m. R/t pt having pulse greater than 100.  Otherwise no other c/o.  Pt believes that the Ditropan is causing her increased heart rate.    Pt has multiple co-morbidities including COPD, pulmonary hypertension, frequent falls, hypertrophic cardiomyopathy, Diabetes, Type II.    Review of Systems  Constitutional: Negative for fever and chills.  HENT: Negative for ear discharge and ear pain.   Eyes: Negative for discharge and redness.  Cardiovascular:       Tachycardia  Gastrointestinal: Negative for blood in stool.  Genitourinary: Positive for urgency. Negative for hematuria and flank pain.  Musculoskeletal: Positive for back pain and falls.       Thoracic spine pain  Neurological: Positive for weakness. Negative for seizures and loss of consciousness.  Endo/Heme/Allergies: Bruises/bleeds easily.  Psychiatric/Behavioral: Negative for suicidal ideas and hallucinations.    Past Medical  History  Diagnosis Date  . Hypertrophic cardiomyopathy   . Hypertension   . Situational mixed anxiety and depressive disorder   . Hypercholesterolemia   . Falls frequently     coumadin stopped  . Pulmonary HTN     has had prior right heart cath in 2010; was felt that most likely due to elevated left sided pressures and would not benefit from vasodilator therapy  . Oxygen dependent     "3L 24/7" (08/05/2013)  . COPD (chronic obstructive pulmonary disease)   . TIA (transient ischemic attack)   . Atrial fibrillation     Chronic; No longer on coumadin  . CHF (congestive heart failure)   . Pacemaker   . Family history of anesthesia complication     "they have to breath for mom for awhile after anesthesia" (08/05/2013)  . Heart murmur   . Varicose veins   . Pneumonia     "couple times; long time ago" (08/05/2013)  . Exertional shortness of breath   . Type II diabetes mellitus   . Anemia     "as a child" (08/05/2013)  . GERD (gastroesophageal reflux disease)   . H/O hiatal hernia   . Osteoarthritis   . Arthritis     "all over me; in all my joints" (08/05/2013)  . Coccyx pain   . Gout     "right foot" (08/05/2013)  . Anxiety   . Depression    Medications Reviewed  Recent Diagnostic Studies: All from  08/05/2013  Acute Back pain, Thoracic spine film, 08/05/2013, mild compression fracture of T8 vertebral body.    Chest x-ray, Cardiomegaly, dual lead cardiac pacemaker is unchanged in position.  There is mild interstitial prominence bilaterally with convincing pulmonary edema.  No gross fractures are identified.  No segmental infilitrate.  No diagnostic  Pneumothorax.    CT of head without contrast, no skull fracture noted, paranasal sinuses and mastoid air cells are unremarkable.  No intracranial hemorrhage, mass effect, or midline shift.  No acute cortical infarction.  Stable cerebral atrophy.  Ventricular size is stable from previous studies.  Stable periventricular and patchy  subcortical chronic white disease.    CT of cervical spine, no acute fracture or subluxation.  Stable degenerative changes, C-1-C-2 articulation.  Disc space flattening with mild anterior and minimal posterior spurring at C5-C6.  Minimal disc space flattening at C7-T1.  No prevertebreal soft tissue swelling.     Recent Labs:   08/08/2013 Na 139 K 3.8 Cl 99 CO2 32 Glucose 150 BUN 15 Creatinine 0.73 Calcium 9.5 Mg 1.6  08/05/2013 AST 45 ALT 13 Alk Phos 64 Bili tot 0.8 Protein 7.4 Albumin 4.3  WBC 7 Neutroabs 5.5 HGB 14 HCT 39.8 MCV 96.4 PLT 183  Labs from 08/13/2013  WBC 7.2 RBC 3.9 Hemoglobin 12.6 Hematocrit 38.6 MCV 100 MCH 32.6 MCHC 32.6 RDW 13.2 Platelets 218 Only abnormality on the differential is lymphocyte percentage at 18 and lymphocyte number at 1.3.  Sodium 133 Potassium 3.7 Chloride 95 CO2 29 AGAP 9 Glucose 169 BUN 22 Creatinine 0.2 Calcium 9.8   Total protein 7.1 Albumin is 4.5 Globulin 2.6 Total bilirubin 1 Direct bilirubin 0.2 ALP 64 AST 40 ALT 14  Vital Signs: Pulse 94, RR 20, BP 148/74, O2 96%, on 2 L O2 Height 5'4"  Weight 140 pounds  Physical Examination:    Head is normocephalic   Pupils RERRLA, EOMI No palpable adenopathy or thyromegaly AP intermittently irregular, otherwise unremarkable, but auscultation of the apical pulse rate is noted to vary between approximately 90-110. BBrS are entirely clear Spine is positive for kyphoscolosis  Assessment/Plan:  Tachycardia, will d/c the Ditropan transdermal at pt's request, doubt really a factor.  Pt is in no distress and other vital signs remain stable.  Baseline pulse is in the 80's and 90's.  Will have pt see her cardiologist as soon as possible.  Has an appointment scheduled for this month.   Fall, pt needs PT eval for safety and mobility.  Will consider obtaining x-rays if pain or swelling persists.  Call bell to be kept in close proximity to the pt, and re-instructed not  to stand or attempt to ambulate without assistance.    COPD continue O2 at 2L and Proair HFA inhaler 2 puffs inhaled every 6 hours as needed for SOB or wheezing.   Will continue to monitor  Atrial fibrillation/CHF., Will increase her Cardiazem to 240 mg per day,cotinue losartan 50 mg once a day, and ASA 81 mg per day.  Will continue to monitor.  Depression,  Continue the zoloft 200 mg each day, and the Wellbutrin XL 365m each day.  Continue to monitor  Insomnia, Restoril 7.5 mg at bedtime  GERD, continue Pepcid 20 mg twice a day  Pain, continue neurontin 300 mg three times a day, Robaxin 500 mg 1 every 8 hours.  Will continue to monitor.  Supplementation, continue KCl at  20 meq twice a day, will continue to monitor  Constipation, continue Senekot twice a day  Will monitor all pt's meds to have ongoig assessment r/t any co-relationship to potential falling.  Will adjust as necessary.

## 2013-11-17 ENCOUNTER — Emergency Department (HOSPITAL_COMMUNITY): Payer: Medicare Other

## 2013-11-17 ENCOUNTER — Inpatient Hospital Stay (HOSPITAL_COMMUNITY)
Admission: EM | Admit: 2013-11-17 | Discharge: 2013-11-20 | DRG: 314 | Disposition: A | Discharge status: Home Health | Payer: Medicare Other | Attending: Internal Medicine | Admitting: Internal Medicine

## 2013-11-17 ENCOUNTER — Encounter (HOSPITAL_COMMUNITY): Payer: Self-pay | Admitting: Emergency Medicine

## 2013-11-17 DIAGNOSIS — I498 Other specified cardiac arrhythmias: Secondary | ICD-10-CM | POA: Diagnosis present

## 2013-11-17 DIAGNOSIS — Z882 Allergy status to sulfonamides status: Secondary | ICD-10-CM

## 2013-11-17 DIAGNOSIS — R55 Syncope and collapse: Secondary | ICD-10-CM

## 2013-11-17 DIAGNOSIS — Z79899 Other long term (current) drug therapy: Secondary | ICD-10-CM

## 2013-11-17 DIAGNOSIS — F4323 Adjustment disorder with mixed anxiety and depressed mood: Secondary | ICD-10-CM

## 2013-11-17 DIAGNOSIS — IMO0002 Reserved for concepts with insufficient information to code with codable children: Secondary | ICD-10-CM

## 2013-11-17 DIAGNOSIS — N390 Urinary tract infection, site not specified: Secondary | ICD-10-CM | POA: Diagnosis present

## 2013-11-17 DIAGNOSIS — R5383 Other fatigue: Secondary | ICD-10-CM

## 2013-11-17 DIAGNOSIS — I4891 Unspecified atrial fibrillation: Secondary | ICD-10-CM | POA: Diagnosis present

## 2013-11-17 DIAGNOSIS — G629 Polyneuropathy, unspecified: Secondary | ICD-10-CM

## 2013-11-17 DIAGNOSIS — J449 Chronic obstructive pulmonary disease, unspecified: Secondary | ICD-10-CM

## 2013-11-17 DIAGNOSIS — IMO0001 Reserved for inherently not codable concepts without codable children: Secondary | ICD-10-CM

## 2013-11-17 DIAGNOSIS — F341 Dysthymic disorder: Secondary | ICD-10-CM | POA: Diagnosis present

## 2013-11-17 DIAGNOSIS — R269 Unspecified abnormalities of gait and mobility: Secondary | ICD-10-CM | POA: Diagnosis present

## 2013-11-17 DIAGNOSIS — Z8673 Personal history of transient ischemic attack (TIA), and cerebral infarction without residual deficits: Secondary | ICD-10-CM

## 2013-11-17 DIAGNOSIS — I422 Other hypertrophic cardiomyopathy: Secondary | ICD-10-CM | POA: Diagnosis present

## 2013-11-17 DIAGNOSIS — F3289 Other specified depressive episodes: Secondary | ICD-10-CM

## 2013-11-17 DIAGNOSIS — M1990 Unspecified osteoarthritis, unspecified site: Secondary | ICD-10-CM | POA: Diagnosis present

## 2013-11-17 DIAGNOSIS — F329 Major depressive disorder, single episode, unspecified: Secondary | ICD-10-CM

## 2013-11-17 DIAGNOSIS — R0603 Acute respiratory distress: Secondary | ICD-10-CM

## 2013-11-17 DIAGNOSIS — Z885 Allergy status to narcotic agent status: Secondary | ICD-10-CM

## 2013-11-17 DIAGNOSIS — W102XXA Fall (on)(from) incline, initial encounter: Secondary | ICD-10-CM

## 2013-11-17 DIAGNOSIS — K219 Gastro-esophageal reflux disease without esophagitis: Secondary | ICD-10-CM | POA: Diagnosis present

## 2013-11-17 DIAGNOSIS — Z9181 History of falling: Secondary | ICD-10-CM

## 2013-11-17 DIAGNOSIS — I340 Nonrheumatic mitral (valve) insufficiency: Secondary | ICD-10-CM

## 2013-11-17 DIAGNOSIS — I272 Pulmonary hypertension, unspecified: Secondary | ICD-10-CM | POA: Diagnosis present

## 2013-11-17 DIAGNOSIS — M549 Dorsalgia, unspecified: Secondary | ICD-10-CM

## 2013-11-17 DIAGNOSIS — W19XXXS Unspecified fall, sequela: Secondary | ICD-10-CM

## 2013-11-17 DIAGNOSIS — I119 Hypertensive heart disease without heart failure: Secondary | ICD-10-CM | POA: Diagnosis present

## 2013-11-17 DIAGNOSIS — M109 Gout, unspecified: Secondary | ICD-10-CM | POA: Diagnosis present

## 2013-11-17 DIAGNOSIS — S32040A Wedge compression fracture of fourth lumbar vertebra, initial encounter for closed fracture: Secondary | ICD-10-CM | POA: Diagnosis present

## 2013-11-17 DIAGNOSIS — I5032 Chronic diastolic (congestive) heart failure: Secondary | ICD-10-CM | POA: Diagnosis present

## 2013-11-17 DIAGNOSIS — E1165 Type 2 diabetes mellitus with hyperglycemia: Secondary | ICD-10-CM

## 2013-11-17 DIAGNOSIS — Z9981 Dependence on supplemental oxygen: Secondary | ICD-10-CM

## 2013-11-17 DIAGNOSIS — R413 Other amnesia: Secondary | ICD-10-CM

## 2013-11-17 DIAGNOSIS — I110 Hypertensive heart disease with heart failure: Secondary | ICD-10-CM | POA: Diagnosis present

## 2013-11-17 DIAGNOSIS — R079 Chest pain, unspecified: Secondary | ICD-10-CM

## 2013-11-17 DIAGNOSIS — Z8249 Family history of ischemic heart disease and other diseases of the circulatory system: Secondary | ICD-10-CM

## 2013-11-17 DIAGNOSIS — Z96659 Presence of unspecified artificial knee joint: Secondary | ICD-10-CM

## 2013-11-17 DIAGNOSIS — R2681 Unsteadiness on feet: Secondary | ICD-10-CM | POA: Diagnosis present

## 2013-11-17 DIAGNOSIS — Z950 Presence of cardiac pacemaker: Secondary | ICD-10-CM

## 2013-11-17 DIAGNOSIS — R05 Cough: Secondary | ICD-10-CM

## 2013-11-17 DIAGNOSIS — E785 Hyperlipidemia, unspecified: Secondary | ICD-10-CM | POA: Diagnosis present

## 2013-11-17 DIAGNOSIS — J4489 Other specified chronic obstructive pulmonary disease: Secondary | ICD-10-CM | POA: Diagnosis present

## 2013-11-17 DIAGNOSIS — I509 Heart failure, unspecified: Secondary | ICD-10-CM | POA: Diagnosis present

## 2013-11-17 DIAGNOSIS — J309 Allergic rhinitis, unspecified: Secondary | ICD-10-CM

## 2013-11-17 DIAGNOSIS — J810 Acute pulmonary edema: Secondary | ICD-10-CM

## 2013-11-17 DIAGNOSIS — Z880 Allergy status to penicillin: Secondary | ICD-10-CM

## 2013-11-17 DIAGNOSIS — I2789 Other specified pulmonary heart diseases: Principal | ICD-10-CM | POA: Diagnosis present

## 2013-11-17 DIAGNOSIS — I10 Essential (primary) hypertension: Secondary | ICD-10-CM

## 2013-11-17 DIAGNOSIS — R0602 Shortness of breath: Secondary | ICD-10-CM

## 2013-11-17 DIAGNOSIS — R059 Cough, unspecified: Secondary | ICD-10-CM

## 2013-11-17 DIAGNOSIS — Z87891 Personal history of nicotine dependence: Secondary | ICD-10-CM

## 2013-11-17 DIAGNOSIS — G459 Transient cerebral ischemic attack, unspecified: Secondary | ICD-10-CM

## 2013-11-17 DIAGNOSIS — E43 Unspecified severe protein-calorie malnutrition: Secondary | ICD-10-CM | POA: Diagnosis present

## 2013-11-17 LAB — URINALYSIS, ROUTINE W REFLEX MICROSCOPIC
Bilirubin Urine: NEGATIVE
GLUCOSE, UA: NEGATIVE mg/dL
KETONES UR: NEGATIVE mg/dL
Nitrite: POSITIVE — AB
Protein, ur: NEGATIVE mg/dL
Specific Gravity, Urine: 1.015 (ref 1.005–1.030)
Urobilinogen, UA: 0.2 mg/dL (ref 0.0–1.0)
pH: 7 (ref 5.0–8.0)

## 2013-11-17 LAB — POCT I-STAT TROPONIN I: Troponin i, poc: 0.02 ng/mL (ref 0.00–0.08)

## 2013-11-17 LAB — CBC
HCT: 44 % (ref 36.0–46.0)
Hemoglobin: 15 g/dL (ref 12.0–15.0)
MCH: 33.3 pg (ref 26.0–34.0)
MCHC: 34.1 g/dL (ref 30.0–36.0)
MCV: 97.8 fL (ref 78.0–100.0)
Platelets: 254 10*3/uL (ref 150–400)
RBC: 4.5 MIL/uL (ref 3.87–5.11)
RDW: 13.5 % (ref 11.5–15.5)
WBC: 8.3 10*3/uL (ref 4.0–10.5)

## 2013-11-17 LAB — BASIC METABOLIC PANEL
BUN: 15 mg/dL (ref 6–23)
CO2: 27 mEq/L (ref 19–32)
CREATININE: 0.73 mg/dL (ref 0.50–1.10)
Calcium: 10.1 mg/dL (ref 8.4–10.5)
Chloride: 99 mEq/L (ref 96–112)
GFR calc Af Amer: >90 mL/min (ref >90)
GFR, EST NON AFRICAN AMERICAN: 82 mL/min — AB (ref >90)
Glucose, Bld: 161 mg/dL — ABNORMAL HIGH (ref 70–99)
POTASSIUM: 3.9 meq/L (ref 3.7–5.3)
Sodium: 143 mEq/L (ref 137–147)

## 2013-11-17 LAB — URINE MICROSCOPIC-ADD ON

## 2013-11-17 MED ORDER — DEXTROSE 5 % IV SOLN
1.0000 g | Freq: Once | INTRAVENOUS | Status: AC
  Administered 2013-11-17: 1 g via INTRAVENOUS
  Filled 2013-11-17: qty 10

## 2013-11-17 MED ORDER — ACETAMINOPHEN 325 MG PO TABS
650.0000 mg | ORAL_TABLET | Freq: Once | ORAL | Status: AC
  Administered 2013-11-17: 650 mg via ORAL
  Filled 2013-11-17: qty 2

## 2013-11-17 MED ORDER — FENTANYL CITRATE 0.05 MG/ML IJ SOLN
25.0000 ug | Freq: Once | INTRAMUSCULAR | Status: AC
  Administered 2013-11-17: 25 ug via INTRAVENOUS
  Filled 2013-11-17: qty 2

## 2013-11-17 NOTE — ED Provider Notes (Signed)
CSN: [id number]     Arrival date & time 11/17/13  1753 History   First MD Initiated Contact with Patient 11/17/13 2033     Chief Complaint  Patient presents with  . Fall   HPI  78 y/o female with history as noted below who presents after fall. The patient states she fell last night in her room at approximately 02:30. She doesn't remember the events surrounding the fall. She is unsure how she fell. She states she hit her head. She is currently complaining of a headache and back pain. She states her pain is currently a 5/10. Her pain worsens with movement. She denies any numbness, weakness or bladder/bowel incontinence.   Past Medical History  Diagnosis Date  . Hypertrophic cardiomyopathy   . Hypertension   . Situational mixed anxiety and depressive disorder   . Hypercholesterolemia   . Falls frequently     coumadin stopped  . Pulmonary HTN     has had prior right heart cath in 2010; was felt that most likely due to elevated left sided pressures and would not benefit from vasodilator therapy  . Oxygen dependent     "3L 24/7" (08/05/2013)  . COPD (chronic obstructive pulmonary disease)   . TIA (transient ischemic attack)   . Atrial fibrillation     Chronic; No longer on coumadin  . CHF (congestive heart failure)   . Pacemaker   . Family history of anesthesia complication     "they have to breath for mom for awhile after anesthesia" (08/05/2013)  . Heart murmur   . Varicose veins   . Pneumonia     "couple times; long time ago" (08/05/2013)  . Exertional shortness of breath   . Type II diabetes mellitus   . Anemia     "as a child" (08/05/2013)  . GERD (gastroesophageal reflux disease)   . H/O hiatal hernia   . Osteoarthritis   . Arthritis     "all over me; in all my joints" (08/05/2013)  . Coccyx pain   . Gout     "right foot" (08/05/2013)  . Anxiety   . Depression    Past Surgical History  Procedure Laterality Date  . Total knee arthroplasty Right 2011  . Tonsillectomy    .  Appendectomy    . Abdominal hysterectomy      "partial" (08/05/2013)  . Dilation and curettage of uterus      "several" (08/05/2013)  . Foot neuroma surgery Right   . Insert / replace / remove pacemaker  1998; 2000's; 2014  . Cardiac catheterization      "more than once" (08/05/2013)  . Back surgery      "bone spur removed; mid back" (08/05/2013)   Family History  Problem Relation Age of Onset  . Other Father     died in plane crash  . Cancer Mother     lymphoma-NHL  . Coronary artery disease Grandchild   . Heart disease Maternal Grandmother    History  Substance Use Topics  . Smoking status: Former Smoker -- 1.00 packs/day for 40 years    Types: Cigarettes    Quit date: 11/15/2005  . Smokeless tobacco: Never Used  . Alcohol Use: No   OB History   Grav Para Term Preterm Abortions TAB SAB Ect Mult Living                 Review of Systems  Constitutional: Negative for fever and chills.  Respiratory: Negative for cough and shortness  of breath.   Cardiovascular: Negative for chest pain.  Gastrointestinal: Negative for nausea, vomiting and abdominal pain.  Genitourinary: Negative for dysuria and frequency.  Musculoskeletal: Positive for back pain.  Neurological: Positive for syncope and headaches. Negative for weakness and numbness.  All other systems reviewed and are negative.   Allergies  Aspirin; Calan; Crestor; Escitalopram oxalate; Lipitor; Lopressor; Nitroglycerin; Norvasc; Nsaids; Oxycodone; Prednisone; Sulfa antibiotics; Vimovo; and Penicillins  Home Medications   No current outpatient prescriptions on file. BP 158/73  Pulse 96  Temp(Src) 98.1 F (36.7 C) (Oral)  Resp 18  Ht 5\' 4"  (1.626 m)  Wt 126 lb 12.2 oz (57.5 kg)  BMI 21.75 kg/m2  SpO2 98% Physical Exam  Nursing note and vitals reviewed. Constitutional: She is oriented to person, place, and time. She appears well-developed and well-nourished. No distress.  HENT:  Head: Normocephalic and atraumatic.   Eyes: Conjunctivae are normal. Pupils are equal, round, and reactive to light.  Neck: Normal range of motion. Neck supple.  Cardiovascular: Normal rate and regular rhythm.  Exam reveals no gallop and no friction rub.   No murmur heard. Pulmonary/Chest: Effort normal and breath sounds normal.  Abdominal: Soft. She exhibits no distension. There is no tenderness.  Musculoskeletal: She exhibits no edema and no tenderness.       Right hip: Normal. She exhibits normal range of motion and no bony tenderness.       Left hip: Normal. She exhibits normal range of motion and no bony tenderness.       Cervical back: She exhibits no bony tenderness.       Thoracic back: She exhibits bony tenderness.       Lumbar back: She exhibits bony tenderness.  Neurological: She is alert and oriented to person, place, and time. She has normal strength and normal reflexes. No cranial nerve deficit or sensory deficit.  Skin: Skin is warm and dry.  Psychiatric: She has a normal mood and affect.    ED Course  Procedures (including critical care time) Labs Review Labs Reviewed  BASIC METABOLIC PANEL - Abnormal; Notable for the following:    Glucose, Bld 161 (*)    GFR calc non Af Amer 82 (*)    All other components within normal limits  URINALYSIS, ROUTINE W REFLEX MICROSCOPIC - Abnormal; Notable for the following:    APPearance CLOUDY (*)    Hgb urine dipstick TRACE (*)    Nitrite POSITIVE (*)    Leukocytes, UA SMALL (*)    All other components within normal limits  URINE MICROSCOPIC-ADD ON - Abnormal; Notable for the following:    Bacteria, UA MANY (*)    Casts HYALINE CASTS (*)    Crystals CA OXALATE CRYSTALS (*)    All other components within normal limits  GLUCOSE, CAPILLARY - Abnormal; Notable for the following:    Glucose-Capillary 60 (*)    All other components within normal limits  URINE CULTURE  CBC  TROPONIN I  GLUCOSE, CAPILLARY  TROPONIN I  TROPONIN I  VITAMIN B12  FOLATE  CORTISOL   HEMOGLOBIN A1C  POCT I-STAT TROPONIN I   Imaging Review Dg Lumbar Spine 2-3 Views  11/17/2013   CLINICAL DATA:  Fall  EXAM: LUMBAR SPINE - 2-3 VIEW  COMPARISON:  02/05/2013  FINDINGS: There is a new L4 compression fracture with at least 60% loss of height anteriorly and centrally. Retropulsion of the superior endplate is suspected. Osteopenia. Severe narrowing of the L5-S1 disc is demonstrated.  IMPRESSION: New  L4 compression fracture as described. Retropulsion of the superior endplate is suspected.   Electronically Signed   By: Maryclare Bean M.D.   On: 11/17/2013 21:50   Dg Pelvis 1-2 Views  11/17/2013   CLINICAL DATA:  Fall  EXAM: PELVIS - 1-2 VIEW  COMPARISON:  None.  FINDINGS: L4 compression fracture is noted. Osteopenia. Otherwise no acute fracture or dislocation.  IMPRESSION: No acute bony injury in the pelvis or femurs.   Electronically Signed   By: Maryclare Bean M.D.   On: 11/17/2013 21:51   Ct Head Wo Contrast  11/17/2013   CLINICAL DATA:  Fall.  Back bruising  EXAM: CT HEAD WITHOUT CONTRAST  CT CERVICAL SPINE WITHOUT CONTRAST  TECHNIQUE: Multidetector CT imaging of the head and cervical spine was performed following the standard protocol without intravenous contrast. Multiplanar CT image reconstructions of the cervical spine were also generated.  COMPARISON:  10/01/2013  FINDINGS: CT HEAD FINDINGS  Skull and Sinuses:Right occipital scalp contusion without underlying fracture.  Orbits: No acute abnormality.  Brain: No evidence of acute abnormality, such as acute infarction, hemorrhage, hydrocephalus, or mass lesion/mass effect. Stable pattern of chronic small vessel ischemic change with patchy bilateral cerebral white matter low density.  CT CERVICAL SPINE FINDINGS  No acute fracture identified. No acute subluxation. There are T1 and T2 superior endplate fractures which were seen previously. Height loss is mild, on the order of 10%. Fracture lucency in the upper T1 body is still readily visible. T2 is  superior endplate changes are predominantly sclerotic. No interval worsening height loss. No posterior element fracturing. There is a band of sclerosis in the upper T3 body, reference dedicated thoracic spine imaging.  IMPRESSION: 1. No evidence of acute intracranial or cervical spine injury. 2. Pre-existing T1 and T2 compression fractures, with incomplete healing at the T1 level. No interval worsening of the mild compression.   Electronically Signed   By: Tiburcio Pea M.D.   On: 11/17/2013 21:36   Ct Cervical Spine Wo Contrast  11/17/2013   CLINICAL DATA:  Fall.  Back bruising  EXAM: CT HEAD WITHOUT CONTRAST  CT CERVICAL SPINE WITHOUT CONTRAST  TECHNIQUE: Multidetector CT imaging of the head and cervical spine was performed following the standard protocol without intravenous contrast. Multiplanar CT image reconstructions of the cervical spine were also generated.  COMPARISON:  10/01/2013  FINDINGS: CT HEAD FINDINGS  Skull and Sinuses:Right occipital scalp contusion without underlying fracture.  Orbits: No acute abnormality.  Brain: No evidence of acute abnormality, such as acute infarction, hemorrhage, hydrocephalus, or mass lesion/mass effect. Stable pattern of chronic small vessel ischemic change with patchy bilateral cerebral white matter low density.  CT CERVICAL SPINE FINDINGS  No acute fracture identified. No acute subluxation. There are T1 and T2 superior endplate fractures which were seen previously. Height loss is mild, on the order of 10%. Fracture lucency in the upper T1 body is still readily visible. T2 is superior endplate changes are predominantly sclerotic. No interval worsening height loss. No posterior element fracturing. There is a band of sclerosis in the upper T3 body, reference dedicated thoracic spine imaging.  IMPRESSION: 1. No evidence of acute intracranial or cervical spine injury. 2. Pre-existing T1 and T2 compression fractures, with incomplete healing at the T1 level. No interval  worsening of the mild compression.   Electronically Signed   By: Tiburcio Pea M.D.   On: 11/17/2013 21:36   Ct Thoracic Spine Wo Contrast  11/17/2013   CLINICAL DATA:  Follow  EXAM: CT THORACIC SPINE WITHOUT CONTRAST  TECHNIQUE: Multidetector CT imaging of the thoracic spine was performed without intravenous contrast administration. Multiplanar CT image reconstructions were also generated.  COMPARISON:  10/12/2013  FINDINGS: Stable T1, T2, T3, and T8 compression fractures with sclerotic changes. Stable chronic superior depression and T5. No new compression fracture. Sclerotic changes in the inferior manubrium likely represent a prior fracture with evidence of healing.  Chronic changes in the lung parenchyma are stable. Stable appearance of the heart with marked enlargement of the left atrium and left ventricle.  IMPRESSION: Stable T1, T2, T3, and T8 compression fractures with sclerotic changes.  There is sclerosis within the manubrium suggesting prior injury with evidence healing. It is a nonspecific finding.   Electronically Signed   By: Maryclare Bean M.D.   On: 11/17/2013 21:27   Ct Lumbar Spine Wo Contrast  11/18/2013   CLINICAL DATA:  Fall.  EXAM: CT LUMBAR SPINE WITHOUT CONTRAST  TECHNIQUE: Multidetector CT imaging of the lumbar spine was performed without intravenous contrast administration. Multiplanar CT image reconstructions were also generated.  COMPARISON:  Lumbar spine radiography 04/18/2011.  FINDINGS: L4 vertebral body fracture with comminution. Height loss is up to 60% centrally. There is bony retropulsion which causes AP canal narrowing to 8 mm. No subluxation or posterior element fracturing. The body appears sclerotic, but the fracture margins are still clearly visible.  Diffuse degenerative disc and facet disease, with disc narrowing most notable at L5-S1 were there is disc bulging and endplate spurring narrows the left foramen. There has been previous left-sided L4-5 laminotomy.  Osteopenia.   Abdominal aortic atherosclerosis. Minimally imaged exophytic lesion from the right kidney, also seen in 2008.  IMPRESSION: Comminuted L4 body fracture which is unhealed, but likely non-acute. Height loss is up to 60%; moderate bony retropulsion and spinal canal stenosis.   Electronically Signed   By: Tiburcio Pea M.D.   On: 11/18/2013 01:29    EKG Interpretation    Date/Time:  Wednesday November 17 2013 18:05:23 EST Ventricular Rate:  77 PR Interval:    QRS Duration: 94 QT Interval:  398 QTC Calculation: 450 R Axis:   83 Text Interpretation:  Atrial flutter with variable A-V block Left ventricular hypertrophy with repolarization abnormality Abnormal ECG No significant change since last tracing Confirmed by GOLDSTON  MD, SCOTT ([id number]) on 11/17/2013 9:26:23 PM            MDM  1. Syncope 2. Lumbar fracture 3. Urinary tract infection.    Here after fall last night. Has headache and back pain. Afebrile. VSS. Non focal neurological exam. CT head normal. Imaging revealed acute L4 fracture with questionable retropulsion on plain film. Will obtain dedicated lumbar CT. UA c/w UTI. No respiratory symptoms or chest pain. EKG without acute ischemic changes. BMP and CBC unremarkable. Troponin negative. Pacer interrogated without acute event. Given UTI, New fracture in setting of syncope will admit for observation. IV rocephin given for UTI.    Shanon Ace, MD 11/18/13 1319

## 2013-11-17 NOTE — ED Notes (Signed)
c-collar applied  

## 2013-11-17 NOTE — ED Notes (Signed)
Dr Gwendolyn LimaBrinolf in room with pt

## 2013-11-17 NOTE — ED Notes (Signed)
Pt states that she fell last night at approx 0300 and was told by her PCP to come to the ED for a CT scan.  Pt states that she just fell, but does not know why.  Pt does not know for sure if she got dizzy prior to falling, or is she had LOC "This time."  Pt states she has been dealing with falls for two weeks.  Pt has a small abrasion to the posterior of her head noted.

## 2013-11-17 NOTE — ED Notes (Signed)
Pt taken to CT.

## 2013-11-17 NOTE — ED Notes (Signed)
Pt states that she has been having multiple falls and at 0300 she fell at home and hit her head but not sure exactly what happened.  Pt has hematoma to right posterior head, no blood thinners, patient reports nausea no vomiting and reports headache with maybe some visual change.    Pt is sore all over from falling, pt states she has neck pain but not new for her.

## 2013-11-18 ENCOUNTER — Emergency Department (HOSPITAL_COMMUNITY): Payer: Medicare Other

## 2013-11-18 DIAGNOSIS — S32040A Wedge compression fracture of fourth lumbar vertebra, initial encounter for closed fracture: Secondary | ICD-10-CM | POA: Diagnosis present

## 2013-11-18 DIAGNOSIS — I2789 Other specified pulmonary heart diseases: Principal | ICD-10-CM

## 2013-11-18 DIAGNOSIS — J309 Allergic rhinitis, unspecified: Secondary | ICD-10-CM

## 2013-11-18 DIAGNOSIS — E43 Unspecified severe protein-calorie malnutrition: Secondary | ICD-10-CM | POA: Diagnosis present

## 2013-11-18 DIAGNOSIS — Z950 Presence of cardiac pacemaker: Secondary | ICD-10-CM

## 2013-11-18 DIAGNOSIS — J984 Other disorders of lung: Secondary | ICD-10-CM

## 2013-11-18 DIAGNOSIS — I379 Nonrheumatic pulmonary valve disorder, unspecified: Secondary | ICD-10-CM

## 2013-11-18 DIAGNOSIS — R55 Syncope and collapse: Secondary | ICD-10-CM | POA: Diagnosis present

## 2013-11-18 DIAGNOSIS — I10 Essential (primary) hypertension: Secondary | ICD-10-CM

## 2013-11-18 DIAGNOSIS — W108XXA Fall (on) (from) other stairs and steps, initial encounter: Secondary | ICD-10-CM

## 2013-11-18 LAB — HEMOGLOBIN A1C
Hgb A1c MFr Bld: 6.4 % — ABNORMAL HIGH (ref <5.7)
MEAN PLASMA GLUCOSE: 137 mg/dL — AB (ref <117)

## 2013-11-18 LAB — GLUCOSE, CAPILLARY
GLUCOSE-CAPILLARY: 60 mg/dL — AB (ref 70–99)
GLUCOSE-CAPILLARY: 76 mg/dL (ref 70–99)
GLUCOSE-CAPILLARY: 184 mg/dL — AB (ref 70–99)
Glucose-Capillary: 118 mg/dL — ABNORMAL HIGH (ref 70–99)

## 2013-11-18 LAB — FOLATE

## 2013-11-18 LAB — TROPONIN I
Troponin I: <0.3 ng/mL (ref <0.30)
Troponin I: <0.3 ng/mL (ref <0.30)

## 2013-11-18 LAB — VITAMIN B12: Vitamin B-12: 1029 pg/mL — ABNORMAL HIGH (ref 211–911)

## 2013-11-18 LAB — CORTISOL: Cortisol, Plasma: 27.8 ug/dL

## 2013-11-18 MED ORDER — SIMVASTATIN 20 MG PO TABS
20.0000 mg | ORAL_TABLET | Freq: Every day | ORAL | Status: DC
  Administered 2013-11-18 – 2013-11-19 (×2): 20 mg via ORAL
  Filled 2013-11-18 (×3): qty 1

## 2013-11-18 MED ORDER — HYDROCODONE-ACETAMINOPHEN 5-325 MG PO TABS
1.0000 | ORAL_TABLET | ORAL | Status: DC | PRN
  Administered 2013-11-18 – 2013-11-19 (×4): 1 via ORAL
  Filled 2013-11-18 (×5): qty 1

## 2013-11-18 MED ORDER — BUPROPION HCL ER (XL) 300 MG PO TB24
300.0000 mg | ORAL_TABLET | Freq: Every day | ORAL | Status: DC
  Administered 2013-11-18 – 2013-11-20 (×3): 300 mg via ORAL
  Filled 2013-11-18 (×3): qty 1

## 2013-11-18 MED ORDER — DILTIAZEM HCL 60 MG PO TABS
120.0000 mg | ORAL_TABLET | Freq: Two times a day (BID) | ORAL | Status: DC
  Administered 2013-11-18 – 2013-11-20 (×5): 120 mg via ORAL
  Filled 2013-11-18 (×6): qty 2

## 2013-11-18 MED ORDER — ACETAMINOPHEN 500 MG PO TABS
500.0000 mg | ORAL_TABLET | Freq: Four times a day (QID) | ORAL | Status: DC | PRN
  Administered 2013-11-19: 500 mg via ORAL
  Filled 2013-11-18: qty 1

## 2013-11-18 MED ORDER — ENSURE PUDDING PO PUDG
1.0000 | ORAL | Status: DC
  Administered 2013-11-18: 1 via ORAL

## 2013-11-18 MED ORDER — GLUCOSE-VITAMIN C 4-6 GM-MG PO CHEW
CHEWABLE_TABLET | ORAL | Status: AC
  Administered 2013-11-18: 13:00:00
  Filled 2013-11-18: qty 3

## 2013-11-18 MED ORDER — POTASSIUM CHLORIDE CRYS ER 20 MEQ PO TBCR
20.0000 meq | EXTENDED_RELEASE_TABLET | Freq: Two times a day (BID) | ORAL | Status: DC
  Administered 2013-11-18 – 2013-11-20 (×5): 20 meq via ORAL
  Filled 2013-11-18 (×7): qty 1

## 2013-11-18 MED ORDER — FAMOTIDINE 20 MG PO TABS: 20.0000 mg | ORAL_TABLET | Freq: Every day | ORAL | Status: DC | PRN

## 2013-11-18 MED ORDER — TEMAZEPAM 7.5 MG PO CAPS: 7.5000 mg | ORAL_CAPSULE | Freq: Every evening | ORAL | Status: DC | PRN

## 2013-11-18 MED ORDER — BUDESONIDE-FORMOTEROL FUMARATE 160-4.5 MCG/ACT IN AERO
2.0000 | INHALATION_SPRAY | Freq: Two times a day (BID) | RESPIRATORY_TRACT | Status: DC
  Administered 2013-11-18 – 2013-11-20 (×5): 2 via RESPIRATORY_TRACT
  Filled 2013-11-18: qty 6

## 2013-11-18 MED ORDER — GLUCOSE-VITAMIN C 4-6 GM-MG PO CHEW: 4.0000 | CHEWABLE_TABLET | ORAL | Status: DC | PRN

## 2013-11-18 MED ORDER — BISOPROLOL FUMARATE 10 MG PO TABS
10.0000 mg | ORAL_TABLET | Freq: Every day | ORAL | Status: DC
  Administered 2013-11-18 – 2013-11-20 (×3): 10 mg via ORAL
  Filled 2013-11-18 (×3): qty 1

## 2013-11-18 MED ORDER — SERTRALINE HCL 100 MG PO TABS
200.0000 mg | ORAL_TABLET | Freq: Every day | ORAL | Status: DC
  Administered 2013-11-18 – 2013-11-20 (×3): 200 mg via ORAL
  Filled 2013-11-18 (×3): qty 2

## 2013-11-18 MED ORDER — DEXTROSE 5 % IV SOLN
1.0000 g | INTRAVENOUS | Status: DC
  Administered 2013-11-18 – 2013-11-20 (×3): 1 g via INTRAVENOUS
  Filled 2013-11-18 (×3): qty 10

## 2013-11-18 MED ORDER — METFORMIN HCL 500 MG PO TABS
500.0000 mg | ORAL_TABLET | Freq: Two times a day (BID) | ORAL | Status: DC
Start: 2013-11-18 — End: 2013-11-18
  Administered 2013-11-18: 500 mg via ORAL
  Filled 2013-11-18 (×3): qty 1

## 2013-11-18 MED ORDER — LOSARTAN POTASSIUM 50 MG PO TABS
100.0000 mg | ORAL_TABLET | Freq: Every day | ORAL | Status: DC
  Administered 2013-11-18 – 2013-11-20 (×3): 100 mg via ORAL
  Filled 2013-11-18 (×3): qty 2

## 2013-11-18 MED ORDER — ALBUTEROL SULFATE HFA 108 (90 BASE) MCG/ACT IN AERS: 2.0000 | INHALATION_SPRAY | Freq: Four times a day (QID) | RESPIRATORY_TRACT | Status: DC | PRN

## 2013-11-18 MED ORDER — BOOST / RESOURCE BREEZE PO LIQD
1.0000 | ORAL | Status: DC
  Administered 2013-11-19: 1 via ORAL

## 2013-11-18 MED ORDER — LORAZEPAM 0.5 MG PO TABS
0.5000 mg | ORAL_TABLET | Freq: Three times a day (TID) | ORAL | Status: DC | PRN
  Administered 2013-11-18 – 2013-11-20 (×3): 0.5 mg via ORAL
  Filled 2013-11-18 (×3): qty 1

## 2013-11-18 MED ORDER — FLUTICASONE PROPIONATE 50 MCG/ACT NA SUSP: 2.0000 | NASAL | Status: DC | PRN

## 2013-11-18 MED ORDER — FUROSEMIDE 8 MG/ML PO SOLN
20.0000 mg | Freq: Every day | ORAL | Status: DC
  Administered 2013-11-18 – 2013-11-20 (×3): 20 mg via ORAL
  Filled 2013-11-18 (×3): qty 5

## 2013-11-18 MED ORDER — INSULIN ASPART 100 UNIT/ML ~~LOC~~ SOLN: 0.0000 [IU] | Freq: Every day | SUBCUTANEOUS | Status: DC

## 2013-11-18 MED ORDER — HYDROCODONE-ACETAMINOPHEN 10-325 MG PO TABS: 1.0000 | ORAL_TABLET | Freq: Four times a day (QID) | ORAL | Status: DC | PRN

## 2013-11-18 MED ORDER — GABAPENTIN 100 MG PO CAPS
100.0000 mg | ORAL_CAPSULE | Freq: Three times a day (TID) | ORAL | Status: DC
  Administered 2013-11-18 – 2013-11-20 (×7): 100 mg via ORAL
  Filled 2013-11-18 (×9): qty 1

## 2013-11-18 MED ORDER — INSULIN ASPART 100 UNIT/ML ~~LOC~~ SOLN
0.0000 [IU] | Freq: Three times a day (TID) | SUBCUTANEOUS | Status: DC
  Administered 2013-11-19 – 2013-11-20 (×2): 1 [IU] via SUBCUTANEOUS

## 2013-11-18 MED ORDER — POTASSIUM CHLORIDE 20 MEQ PO PACK
20.0000 meq | PACK | Freq: Two times a day (BID) | ORAL | Status: DC
  Filled 2013-11-18 (×2): qty 1

## 2013-11-18 MED ORDER — ALBUTEROL SULFATE (2.5 MG/3ML) 0.083% IN NEBU: 2.5000 mg | INHALATION_SOLUTION | Freq: Four times a day (QID) | RESPIRATORY_TRACT | Status: DC | PRN

## 2013-11-18 NOTE — Progress Notes (Signed)
UR completed. Patient changed to inpatient r/t requiring IV antibiotics.  

## 2013-11-18 NOTE — Progress Notes (Signed)
  Echocardiogram 2D Echocardiogram has been performed.  [name] [name], [name] [name] 11/18/2013, 4:27 PM

## 2013-11-18 NOTE — Progress Notes (Signed)
Hypoglycemic Event  CBG: 60  Treatment: 3 glucose tabs  Symptoms: Pale and Shaky  Follow-up CBG: Time:1258 CBG Result:76  Possible Reasons for Event: Inadequate meal intake  Comments/MD notified: Dr. Isidoro Donningai notified and called to ask about pt's symptoms.     Sherene Siresillman, Bradrick Kamau Jo  Remember to initiate Hypoglycemia Order Set & complete

## 2013-11-18 NOTE — Progress Notes (Signed)
Patient ID: [name] [name]  female  ZOX:[id number]    DOB: 12/10/1935    DOA: 11/17/2013  PCP: Lupe Carney, MD  Assessment/Plan: Principal Problem:   Syncope: without any aura, denies any dizziness as lightheadedness, mostly with exertion. She also has a history of severe pulmonary hypertension with restrictive diastolic dysfunction, PA pressure 71 - Per patient multiple syncopal episodes at least 5 of them last month at the facility while she was undergoing rehabilitation and wanted yesterday which brought her to the hospital. Patient reports waking up on the floor. - Troponins negative so far. EKG however shows atrial fibrillation with marked T wave inversions in leads 2, aVL, V3 to V6 however she also has the same T-wave inversion in EKG from 11 2014 - Cardiology consult called, 2-D echo ordered, obtain serial troponins, B12, folate, cortisone level - Patient may need to have pacemaker interrogation, will defer to cardiology service  Atrial fibrillation: Seen on the EKG, not new. She has a history of frequent falls, hence not anticoagulation candidate.  - On Cardizem and beta blocker   Active Problems:   Type II or unspecified type diabetes mellitus without mention of complication, uncontrolled: ? Autonomic dysfunction - Continue sliding scale insulin    Pulmonary HTN    COPD (chronic obstructive pulmonary disease) - Currently stable, no acute wheezing    Compression fracture of L4 lumbar vertebra - PTOT evaluation, denies any acute pain    Protein-calorie malnutrition, severe - Nutrition consult  DVT Prophylaxis:SCDs  Code Status:  Family Communication:  Disposition:    Subjective: Denies any acute pain, no dizziness, lightheadedness, no syncopal episode during the hospitalization, no chest pain or shortness of breath  Objective: Weight change:   Intake/Output Summary (Last 24 hours) at 11/18/13 1419 Last data filed at 11/18/13 0324  Gross per 24 hour  Intake      0 ml   Output    250 ml  Net   -250 ml   Blood pressure 158/73, pulse 96, temperature 98.1 F (36.7 C), temperature source Oral, resp. rate 18, height 5\' 4"  (1.626 m), weight 57.5 kg (126 lb 12.2 oz), SpO2 98.00%.  Physical Exam: General: Alert and awake, oriented x3, not in any acute distress. CVS: S1-S2 clear, no murmur rubs or gallops Chest: clear to auscultation bilaterally, no wheezing, rales or rhonchi Abdomen: soft nontender, nondistended, normal bowel sounds  Extremities: no cyanosis, clubbing or edema noted bilaterally Neuro: Cranial nerves II-XII intact, no focal neurological deficits  Lab Results: Basic Metabolic Panel:  Recent Labs Lab 11/17/13 1843  NA 143  K 3.9  CL 99  CO2 27  GLUCOSE 161*  BUN 15  CREATININE 0.73  CALCIUM 10.1   Liver Function Tests: No results found for this basename: AST, ALT, ALKPHOS, BILITOT, PROT, ALBUMIN,  in the last 168 hours No results found for this basename: LIPASE, AMYLASE,  in the last 168 hours No results found for this basename: AMMONIA,  in the last 168 hours CBC:  Recent Labs Lab 11/17/13 1843  WBC 8.3  HGB 15.0  HCT 44.0  MCV 97.8  PLT 254   Cardiac Enzymes:  Recent Labs Lab 11/18/13 0956  TROPONINI <0.30   BNP: No components found with this basename: POCBNP,  CBG:  Recent Labs Lab 11/18/13 1224 11/18/13 1258  GLUCAP 60* 76     Micro Results: No results found for this or any previous visit (from the past 240 hour(s)).  Studies/Results: Dg Lumbar Spine 2-3 Views  11/17/2013  CLINICAL DATA:  Fall  EXAM: LUMBAR SPINE - 2-3 VIEW  COMPARISON:  02/05/2013  FINDINGS: There is a new L4 compression fracture with at least 60% loss of height anteriorly and centrally. Retropulsion of the superior endplate is suspected. Osteopenia. Severe narrowing of the L5-S1 disc is demonstrated.  IMPRESSION: New L4 compression fracture as described. Retropulsion of the superior endplate is suspected.   Electronically Signed   By:  Maryclare Bean M.D.   On: 11/17/2013 21:50   Dg Pelvis 1-2 Views  11/17/2013   CLINICAL DATA:  Fall  EXAM: PELVIS - 1-2 VIEW  COMPARISON:  None.  FINDINGS: L4 compression fracture is noted. Osteopenia. Otherwise no acute fracture or dislocation.  IMPRESSION: No acute bony injury in the pelvis or femurs.   Electronically Signed   By: Maryclare Bean M.D.   On: 11/17/2013 21:51   Ct Head Wo Contrast  11/17/2013   CLINICAL DATA:  Fall.  Back bruising  EXAM: CT HEAD WITHOUT CONTRAST  CT CERVICAL SPINE WITHOUT CONTRAST  TECHNIQUE: Multidetector CT imaging of the head and cervical spine was performed following the standard protocol without intravenous contrast. Multiplanar CT image reconstructions of the cervical spine were also generated.  COMPARISON:  10/01/2013  FINDINGS: CT HEAD FINDINGS  Skull and Sinuses:Right occipital scalp contusion without underlying fracture.  Orbits: No acute abnormality.  Brain: No evidence of acute abnormality, such as acute infarction, hemorrhage, hydrocephalus, or mass lesion/mass effect. Stable pattern of chronic small vessel ischemic change with patchy bilateral cerebral white matter low density.  CT CERVICAL SPINE FINDINGS  No acute fracture identified. No acute subluxation. There are T1 and T2 superior endplate fractures which were seen previously. Height loss is mild, on the order of 10%. Fracture lucency in the upper T1 body is still readily visible. T2 is superior endplate changes are predominantly sclerotic. No interval worsening height loss. No posterior element fracturing. There is a band of sclerosis in the upper T3 body, reference dedicated thoracic spine imaging.  IMPRESSION: 1. No evidence of acute intracranial or cervical spine injury. 2. Pre-existing T1 and T2 compression fractures, with incomplete healing at the T1 level. No interval worsening of the mild compression.   Electronically Signed   By: Tiburcio Pea M.D.   On: 11/17/2013 21:36   Ct Cervical Spine Wo  Contrast  11/17/2013   CLINICAL DATA:  Fall.  Back bruising  EXAM: CT HEAD WITHOUT CONTRAST  CT CERVICAL SPINE WITHOUT CONTRAST  TECHNIQUE: Multidetector CT imaging of the head and cervical spine was performed following the standard protocol without intravenous contrast. Multiplanar CT image reconstructions of the cervical spine were also generated.  COMPARISON:  10/01/2013  FINDINGS: CT HEAD FINDINGS  Skull and Sinuses:Right occipital scalp contusion without underlying fracture.  Orbits: No acute abnormality.  Brain: No evidence of acute abnormality, such as acute infarction, hemorrhage, hydrocephalus, or mass lesion/mass effect. Stable pattern of chronic small vessel ischemic change with patchy bilateral cerebral white matter low density.  CT CERVICAL SPINE FINDINGS  No acute fracture identified. No acute subluxation. There are T1 and T2 superior endplate fractures which were seen previously. Height loss is mild, on the order of 10%. Fracture lucency in the upper T1 body is still readily visible. T2 is superior endplate changes are predominantly sclerotic. No interval worsening height loss. No posterior element fracturing. There is a band of sclerosis in the upper T3 body, reference dedicated thoracic spine imaging.  IMPRESSION: 1. No evidence of acute intracranial or cervical spine injury.  2. Pre-existing T1 and T2 compression fractures, with incomplete healing at the T1 level. No interval worsening of the mild compression.   Electronically Signed   By: Tiburcio PeaJonathan  Watts M.D.   On: 11/17/2013 21:36   Ct Thoracic Spine Wo Contrast  11/17/2013   CLINICAL DATA:  Follow  EXAM: CT THORACIC SPINE WITHOUT CONTRAST  TECHNIQUE: Multidetector CT imaging of the thoracic spine was performed without intravenous contrast administration. Multiplanar CT image reconstructions were also generated.  COMPARISON:  10/12/2013  FINDINGS: Stable T1, T2, T3, and T8 compression fractures with sclerotic changes. Stable chronic superior  depression and T5. No new compression fracture. Sclerotic changes in the inferior manubrium likely represent a prior fracture with evidence of healing.  Chronic changes in the lung parenchyma are stable. Stable appearance of the heart with marked enlargement of the left atrium and left ventricle.  IMPRESSION: Stable T1, T2, T3, and T8 compression fractures with sclerotic changes.  There is sclerosis within the manubrium suggesting prior injury with evidence healing. It is a nonspecific finding.   Electronically Signed   By: Maryclare BeanArt  Hoss M.D.   On: 11/17/2013 21:27   Ct Lumbar Spine Wo Contrast  11/18/2013   CLINICAL DATA:  Fall.  EXAM: CT LUMBAR SPINE WITHOUT CONTRAST  TECHNIQUE: Multidetector CT imaging of the lumbar spine was performed without intravenous contrast administration. Multiplanar CT image reconstructions were also generated.  COMPARISON:  Lumbar spine radiography 04/18/2011.  FINDINGS: L4 vertebral body fracture with comminution. Height loss is up to 60% centrally. There is bony retropulsion which causes AP canal narrowing to 8 mm. No subluxation or posterior element fracturing. The body appears sclerotic, but the fracture margins are still clearly visible.  Diffuse degenerative disc and facet disease, with disc narrowing most notable at L5-S1 were there is disc bulging and endplate spurring narrows the left foramen. There has been previous left-sided L4-5 laminotomy.  Osteopenia.  Abdominal aortic atherosclerosis. Minimally imaged exophytic lesion from the right kidney, also seen in 2008.  IMPRESSION: Comminuted L4 body fracture which is unhealed, but likely non-acute. Height loss is up to 60%; moderate bony retropulsion and spinal canal stenosis.   Electronically Signed   By: Tiburcio PeaJonathan  Watts M.D.   On: 11/18/2013 01:29    Medications: Scheduled Meds: . bisoprolol  10 mg Oral Daily  . budesonide-formoterol  2 puff Inhalation BID  . buPROPion  300 mg Oral Daily  . cefTRIAXone (ROCEPHIN)  IV  1 g  Intravenous Q24H  . diltiazem  120 mg Oral BID  . feeding supplement (ENSURE)  1 Container Oral Q24H  . feeding supplement (RESOURCE BREEZE)  1 Container Oral Q24H  . furosemide  20 mg Oral Daily  . gabapentin  100 mg Oral TID  . insulin aspart  0-5 Units Subcutaneous QHS  . insulin aspart  0-9 Units Subcutaneous TID WC  . losartan  100 mg Oral Daily  . potassium chloride  20 mEq Oral BID  . sertraline  200 mg Oral Daily  . simvastatin  20 mg Oral QHS      LOS: 1 day   RAI,RIPUDEEP M.D. [facility] 11/18/2013, 2:19 PM Pager: [phone]  If 7PM-7AM, please contact night-coverage [email or web address] Password [email or web address]

## 2013-11-18 NOTE — H&P (Signed)
[facility] History and Physical  [name] [name] WJX:[id number] DOB: 07-19-1936 DOA: 11/17/2013  Referring physician: EDP PCP: Lupe Carney, MD   Chief Complaint: Fall, back pain   HPI: [name] [name] is a 78 y.o. female who presents to the ED after a fall at home today.  She was walking and next thing she knew she was on the ground, no recollection of fall.  She has been having falls like this (with exertion) for the past 2 years since her husband died.  Unfortunately she has a L4 compression fracture after today's fall.  Syncopal episodes always occur with exertion (ie walking), never while sitting down, and not with position changes.  Review of Systems: Systems reviewed.  As above, otherwise negative  Past Medical History  Diagnosis Date  . Hypertrophic cardiomyopathy   . Hypertension   . Situational mixed anxiety and depressive disorder   . Hypercholesterolemia   . Falls frequently     coumadin stopped  . Pulmonary HTN     has had prior right heart cath in 2010; was felt that most likely due to elevated left sided pressures and would not benefit from vasodilator therapy  . Oxygen dependent     "3L 24/7" (08/05/2013)  . COPD (chronic obstructive pulmonary disease)   . TIA (transient ischemic attack)   . Atrial fibrillation     Chronic; No longer on coumadin  . CHF (congestive heart failure)   . Pacemaker   . Family history of anesthesia complication     "they have to breath for mom for awhile after anesthesia" (08/05/2013)  . Heart murmur   . Varicose veins   . Pneumonia     "couple times; long time ago" (08/05/2013)  . Exertional shortness of breath   . Type II diabetes mellitus   . Anemia     "as a child" (08/05/2013)  . GERD (gastroesophageal reflux disease)   . H/O hiatal hernia   . Osteoarthritis   . Arthritis     "all over me; in all my joints" (08/05/2013)  . Coccyx pain   . Gout     "right foot" (08/05/2013)  . Anxiety   . Depression    Past Surgical History   Procedure Laterality Date  . Total knee arthroplasty Right 2011  . Tonsillectomy    . Appendectomy    . Abdominal hysterectomy      "partial" (08/05/2013)  . Dilation and curettage of uterus      "several" (08/05/2013)  . Foot neuroma surgery Right   . Insert / replace / remove pacemaker  1998; 2000's; 2014  . Cardiac catheterization      "more than once" (08/05/2013)  . Back surgery      "bone spur removed; mid back" (08/05/2013)   Social History:  reports that she quit smoking about 8 years ago. Her smoking use included Cigarettes. She has a 40 pack-year smoking history. She has never used smokeless tobacco. She reports that she does not drink alcohol or use illicit drugs.  Allergies  Allergen Reactions  . Aspirin     Upsets stomach really bad  . Calan [Verapamil Hcl] Other (See Comments)    Patient states it makes her "out of her mind"; bp bottoms out  . Crestor [Rosuvastatin Calcium] Other (See Comments)    Muscle pain  . Escitalopram Oxalate Other (See Comments)    Unknown   . Lipitor [Atorvastatin Calcium] Other (See Comments)    myalgia  . Lopressor [Metoprolol Tartrate] Other (  See Comments)    Blood pressure bottoms out   . Nitroglycerin   . Norvasc [Amlodipine Besylate] Other (See Comments)    fatigue  . Nsaids Nausea Only  . Oxycodone   . Prednisone     SWELLING  . Sulfa Antibiotics Nausea Only and Other (See Comments)    Makes her stomach hurt  . Vimovo [Naproxen-Esomeprazole] Nausea Only  . Penicillins Hives and Rash    Family History  Problem Relation Age of Onset  . Other Father     died in plane crash  . Cancer Mother     lymphoma-NHL  . Coronary artery disease Grandchild   . Heart disease Maternal Grandmother      Prior to Admission medications   Medication Sig Start Date End Date Taking? Authorizing Provider  acetaminophen (TYLENOL) 500 MG tablet Take 500 mg by mouth every 6 (six) hours as needed for moderate pain.   Yes Historical Provider, MD   BISOPROLOL FUMARATE PO Take 10 mg by mouth daily.   Yes Historical Provider, MD  budesonide-formoterol (SYMBICORT) 160-4.5 MCG/ACT inhaler Inhale 2 puffs into the lungs 2 (two) times daily.   Yes Historical Provider, MD  buPROPion (WELLBUTRIN XL) 300 MG 24 hr tablet Take 300 mg by mouth daily.   Yes Historical Provider, MD  conjugated estrogens (PREMARIN) vaginal cream Place 1 Applicatorful vaginally daily as needed.   Yes Historical Provider, MD  diltiazem (CARDIZEM) 120 MG tablet Take 120 mg by mouth 2 (two) times daily.   Yes Historical Provider, MD  famotidine (PEPCID) 20 MG tablet Take 20 mg by mouth daily as needed for heartburn or indigestion.   Yes Historical Provider, MD  fluticasone (FLONASE) 50 MCG/ACT nasal spray Place 2 sprays into both nostrils as needed for rhinitis or allergies.   Yes Historical Provider, MD  furosemide (LASIX) 10 MG/ML solution Take 20 mg by mouth daily.   Yes Historical Provider, MD  gabapentin (NEURONTIN) 100 MG capsule Take 100 mg by mouth 3 (three) times daily.   Yes Historical Provider, MD  HYDROcodone-acetaminophen (NORCO) 10-325 MG per tablet Take 1 tablet by mouth every 6 (six) hours as needed for moderate pain.   Yes Historical Provider, MD  LORazepam (ATIVAN) 0.5 MG tablet Take 0.5 mg by mouth 3 (three) times daily as needed for anxiety.   Yes Historical Provider, MD  losartan (COZAAR) 100 MG tablet Take 100 mg by mouth daily.   Yes Historical Provider, MD  metFORMIN (GLUCOPHAGE) 500 MG tablet Take 500 mg by mouth 2 (two) times daily with a meal.   Yes Historical Provider, MD  potassium chloride (KLOR-CON) 20 MEQ packet Take by mouth 2 (two) times daily.   Yes Historical Provider, MD  PROAIR HFA 108 (90 BASE) MCG/ACT inhaler Inhale 2 puffs into the lungs every 6 (six) hours as needed. For shortness of breath/wheezing 02/11/12  Yes Historical Provider, MD  sertraline (ZOLOFT) 100 MG tablet Take 200 mg by mouth daily.    Yes Historical Provider, MD   simvastatin (ZOCOR) 20 MG tablet Take 20 mg by mouth at bedtime.    Yes Historical Provider, MD  temazepam (RESTORIL) 7.5 MG capsule Take 1 capsule (7.5 mg total) by mouth at bedtime as needed for sleep or anxiety. Insomnia 08/10/13  Yes Ripudeep Jenna Luo, MD   Physical Exam: Filed Vitals:   11/17/13 2054  BP: 149/98  Pulse: 80  Temp:   Resp: 18    BP 149/98  Pulse 80  Temp(Src) 97.7 F (36.5  C)  Resp 18  SpO2 95%  General Appearance:    Alert, oriented, no distress, appears stated age  Head:    Normocephalic, atraumatic  Eyes:    PERRL, EOMI, sclera non-icteric        Nose:   Nares without drainage or epistaxis. Mucosa, turbinates normal  Throat:   Moist mucous membranes. Oropharynx without erythema or exudate.  Neck:   Supple. No carotid bruits.  No thyromegaly.  No lymphadenopathy.   Back:     No CVA tenderness, no spinal tenderness  Lungs:     Clear to auscultation bilaterally, without wheezes, rhonchi or rales  Chest wall:    No tenderness to palpitation  Heart:    Regular rate and rhythm without murmurs, gallops, rubs  Abdomen:     Soft, non-tender, nondistended, normal bowel sounds, no organomegaly  Genitalia:    deferred  Rectal:    deferred  Extremities:   No clubbing, cyanosis or edema.  Pulses:   2+ and symmetric all extremities  Skin:   Skin color, texture, turgor normal, no rashes or lesions  Lymph nodes:   Cervical, supraclavicular, and axillary nodes normal  Neurologic:   CNII-XII intact. Normal strength, sensation and reflexes      throughout    Labs on Admission:  Basic Metabolic Panel:  Recent Labs Lab 11/17/13 1843  NA 143  K 3.9  CL 99  CO2 27  GLUCOSE 161*  BUN 15  CREATININE 0.73  CALCIUM 10.1   Liver Function Tests: No results found for this basename: AST, ALT, ALKPHOS, BILITOT, PROT, ALBUMIN,  in the last 168 hours No results found for this basename: LIPASE, AMYLASE,  in the last 168 hours No results found for this basename: AMMONIA,   in the last 168 hours CBC:  Recent Labs Lab 11/17/13 1843  WBC 8.3  HGB 15.0  HCT 44.0  MCV 97.8  PLT 254   Cardiac Enzymes: No results found for this basename: CKTOTAL, CKMB, CKMBINDEX, TROPONINI,  in the last 168 hours  BNP (last 3 results)  Recent Labs  11/22/12 1425 12/11/12 1135 05/07/13 2128  PROBNP 1995.0* 283.0* 1129.0*   CBG: No results found for this basename: GLUCAP,  in the last 168 hours  Radiological Exams on Admission: Dg Lumbar Spine 2-3 Views  11/17/2013   CLINICAL DATA:  Fall  EXAM: LUMBAR SPINE - 2-3 VIEW  COMPARISON:  02/05/2013  FINDINGS: There is a new L4 compression fracture with at least 60% loss of height anteriorly and centrally. Retropulsion of the superior endplate is suspected. Osteopenia. Severe narrowing of the L5-S1 disc is demonstrated.  IMPRESSION: New L4 compression fracture as described. Retropulsion of the superior endplate is suspected.   Electronically Signed   By: Maryclare BeanArt  Hoss M.D.   On: 11/17/2013 21:50   Dg Pelvis 1-2 Views  11/17/2013   CLINICAL DATA:  Fall  EXAM: PELVIS - 1-2 VIEW  COMPARISON:  None.  FINDINGS: L4 compression fracture is noted. Osteopenia. Otherwise no acute fracture or dislocation.  IMPRESSION: No acute bony injury in the pelvis or femurs.   Electronically Signed   By: Maryclare BeanArt  Hoss M.D.   On: 11/17/2013 21:51   Ct Head Wo Contrast  11/17/2013   CLINICAL DATA:  Fall.  Back bruising  EXAM: CT HEAD WITHOUT CONTRAST  CT CERVICAL SPINE WITHOUT CONTRAST  TECHNIQUE: Multidetector CT imaging of the head and cervical spine was performed following the standard protocol without intravenous contrast. Multiplanar CT image reconstructions of the  cervical spine were also generated.  COMPARISON:  10/01/2013  FINDINGS: CT HEAD FINDINGS  Skull and Sinuses:Right occipital scalp contusion without underlying fracture.  Orbits: No acute abnormality.  Brain: No evidence of acute abnormality, such as acute infarction, hemorrhage, hydrocephalus, or mass  lesion/mass effect. Stable pattern of chronic small vessel ischemic change with patchy bilateral cerebral white matter low density.  CT CERVICAL SPINE FINDINGS  No acute fracture identified. No acute subluxation. There are T1 and T2 superior endplate fractures which were seen previously. Height loss is mild, on the order of 10%. Fracture lucency in the upper T1 body is still readily visible. T2 is superior endplate changes are predominantly sclerotic. No interval worsening height loss. No posterior element fracturing. There is a band of sclerosis in the upper T3 body, reference dedicated thoracic spine imaging.  IMPRESSION: 1. No evidence of acute intracranial or cervical spine injury. 2. Pre-existing T1 and T2 compression fractures, with incomplete healing at the T1 level. No interval worsening of the mild compression.   Electronically Signed   By: Tiburcio Pea M.D.   On: 11/17/2013 21:36   Ct Cervical Spine Wo Contrast  11/17/2013   CLINICAL DATA:  Fall.  Back bruising  EXAM: CT HEAD WITHOUT CONTRAST  CT CERVICAL SPINE WITHOUT CONTRAST  TECHNIQUE: Multidetector CT imaging of the head and cervical spine was performed following the standard protocol without intravenous contrast. Multiplanar CT image reconstructions of the cervical spine were also generated.  COMPARISON:  10/01/2013  FINDINGS: CT HEAD FINDINGS  Skull and Sinuses:Right occipital scalp contusion without underlying fracture.  Orbits: No acute abnormality.  Brain: No evidence of acute abnormality, such as acute infarction, hemorrhage, hydrocephalus, or mass lesion/mass effect. Stable pattern of chronic small vessel ischemic change with patchy bilateral cerebral white matter low density.  CT CERVICAL SPINE FINDINGS  No acute fracture identified. No acute subluxation. There are T1 and T2 superior endplate fractures which were seen previously. Height loss is mild, on the order of 10%. Fracture lucency in the upper T1 body is still readily visible. T2  is superior endplate changes are predominantly sclerotic. No interval worsening height loss. No posterior element fracturing. There is a band of sclerosis in the upper T3 body, reference dedicated thoracic spine imaging.  IMPRESSION: 1. No evidence of acute intracranial or cervical spine injury. 2. Pre-existing T1 and T2 compression fractures, with incomplete healing at the T1 level. No interval worsening of the mild compression.   Electronically Signed   By: Tiburcio Pea M.D.   On: 11/17/2013 21:36   Ct Thoracic Spine Wo Contrast  11/17/2013   CLINICAL DATA:  Follow  EXAM: CT THORACIC SPINE WITHOUT CONTRAST  TECHNIQUE: Multidetector CT imaging of the thoracic spine was performed without intravenous contrast administration. Multiplanar CT image reconstructions were also generated.  COMPARISON:  10/12/2013  FINDINGS: Stable T1, T2, T3, and T8 compression fractures with sclerotic changes. Stable chronic superior depression and T5. No new compression fracture. Sclerotic changes in the inferior manubrium likely represent a prior fracture with evidence of healing.  Chronic changes in the lung parenchyma are stable. Stable appearance of the heart with marked enlargement of the left atrium and left ventricle.  IMPRESSION: Stable T1, T2, T3, and T8 compression fractures with sclerotic changes.  There is sclerosis within the manubrium suggesting prior injury with evidence healing. It is a nonspecific finding.   Electronically Signed   By: Maryclare Bean M.D.   On: 11/17/2013 21:27    EKG: Independently reviewed.  Assessment/Plan Active Problems:   Pulmonary HTN   Syncope and collapse   Closed compression fracture of L4 lumbar vertebra   1. Syncope and collapse - while the patient did have very small amount of leukocyte esterase, she has had no symptoms of infection nor her typical UTI symptoms to suggest that she has a active UTI.  More likely is that her falls for the past 2 years are due to her pulmonary artery  pressure being 71 as demonstrated on her most recent echo in 09/14 (exertional syncope from severe pulmonary hypertension).  Additionally we have a low degree of suspicion of an arrhythmia, because the patients pacemaker reports no events since last September (when we interrogated it tonight).  Will admit patient, pain control for her L4 fracture, thankfully she already sees a pulmonologist for COPD, will see if they have any recommendations for pulmonary HTN that I believe is causing her exertional syncope.    Code Status: Full  Family Communication: No family in patient Disposition Plan: admit to obs  Time spent: 70 min  GARDNER, JARED M. [facility] Pager [phone]  If 7AM-7PM, please contact the day team taking care of the patient [email or web address] Password [facility] 11/18/2013, 12:26 AM

## 2013-11-18 NOTE — ED Provider Notes (Signed)
I saw and evaluated the patient, reviewed the resident's note and I agree with the findings and plan.  EKG Interpretation    Date/Time:  Wednesday November 17 2013 18:05:23 EST Ventricular Rate:  77 PR Interval:    QRS Duration: 94 QT Interval:  398 QTC Calculation: 450 R Axis:   83 Text Interpretation:  Atrial flutter with variable A-V block Left ventricular hypertrophy with repolarization abnormality Abnormal ECG No significant change since last tracing Confirmed by Shandrika Ambers  MD, Nirvan Laban ([id number]) on 11/17/2013 9:26:23 PM            Patient with syncope and new lumbar fracture. Generally weak, labs c/w UTI. Given all of this, will need admission for IV abx, tele and pain control  Audree CamelScott T Hiroko Tregre, MD 11/18/13 [phone]

## 2013-11-18 NOTE — Progress Notes (Addendum)
INITIAL NUTRITION ASSESSMENT  DOCUMENTATION CODES Per approved criteria  -Severe malnutrition in the context of chronic illness  Pt meets criteria for SEVERE MALNUTRITION in the context of Chronic illness as evidenced by 11% weight loss in less than 3 months and estimated energy intake <75% of estimated energy needs for > 1 month.  INTERVENTION: Immunologistrovide Resource Breeze, B[employer] Group[employer] Cup, and KelloggEnsure Pudding once daily each Provide [employer]arn[employer] Instant Breakfast once Encourage PO intake   NUTRITION DIAGNOSIS: Inadequate oral intake related to poor appetite as evidenced by 11% weight loss in less than 3 months.   Goal: Pt to meet >/= 90% of their estimated nutrition needs   Monitor:  PO intake Weight Labs  Reason for Assessment: Malnutrition Screening Tool, score of 2  78 y.o. female  Admitting Dx: Syncope  ASSESSMENT: 78 y.o. female who presents to the ED after a fall at home today. She was walking and next thing she knew she was on the ground, no recollection of fall. She has been having falls like this (with exertion) for the past 2 years since her husband died. Unfortunately she has a L4 compression fracture after today's fall.  Pt reports having a poor appetite for the past few months. She states she used to weigh 140 lbs and eat 3 meals daily but, she has only been eating 2 meals daily for the past few months and has been steadily losing weight with most recent weight of 121 lbs just before being admitted. Pt states she ate about 30% of her breakfast this morning which is about the amount of food she was tolerating at home. She recently started drinking whole milk and Ensure to help prevent additional weight loss but, Ensure causes diarrhea. Pt interested in trying a variety of supplements while admitted.  Based on pt's report and weight history below, pt has had 11% weight loss in the past 3 months.  Nutrition Focused Physical Exam:  Subcutaneous Fat:  Orbital Region: wnl Upper Arm  Region: mild wasting Thoracic and Lumbar Region: NA  Muscle:  Temple Region: mild wasting Clavicle Bone Region: mild wasting Clavicle and Acromion Bone Region: mild wasting Scapular Bone Region: NA Dorsal Hand: mild wasting Patellar Region: mild wasting Anterior Thigh Region: moderate wasting Posterior Calf Region: NA  Edema: none  Height: Ht Readings from Last 1 Encounters:  11/18/13 5\' 4"  (1.626 m)    Weight: Wt Readings from Last 1 Encounters:  11/18/13 126 lb 12.2 oz (57.5 kg)    Ideal Body Weight: 120 lbs  % Ideal Body Weight: 105%  Wt Readings from Last 10 Encounters:  11/18/13 126 lb 12.2 oz (57.5 kg)  10/25/13 130 lb (58.968 kg)  10/05/13 127 lb (57.607 kg)  08/31/13 136 lb (61.689 kg)  08/10/13 139 lb 1.8 oz (63.1 kg)  06/30/13 138 lb (62.596 kg)  06/16/13 146 lb 12.8 oz (66.588 kg)  06/01/13 146 lb (66.225 kg)  05/24/13 146 lb (66.225 kg)  05/10/13 143 lb 4.8 oz (65 kg)  Pt reports weighing 121 lbs at home just prior to admission.   Usual Body Weight: 140 lbs  % Usual Body Weight: 90%  BMI:  Body mass index is 21.75 kg/(m^2).  Estimated Nutritional Needs: Kcal: 1500-1700 Protein: 65-75 grams Fluid: 1.5-1.7 L/day  Skin: intact  Diet Order: Carb Control  EDUCATION NEEDS: -No education needs identified at this time   Intake/Output Summary (Last 24 hours) at 11/18/13 1145 Last data filed at 11/18/13 0324  Gross per 24 hour  Intake  0 ml  Output    250 ml  Net   -250 ml    Last BM: 1/7  Labs:   Recent Labs Lab 11/17/13 1843  NA 143  K 3.9  CL 99  CO2 27  BUN 15  CREATININE 0.73  CALCIUM 10.1  GLUCOSE 161*    CBG (last 3)  No results found for this basename: GLUCAP,  in the last 72 hours  Scheduled Meds: . bisoprolol  10 mg Oral Daily  . budesonide-formoterol  2 puff Inhalation BID  . buPROPion  300 mg Oral Daily  . cefTRIAXone (ROCEPHIN)  IV  1 g Intravenous Q24H  . diltiazem  120 mg Oral BID  . furosemide  20 mg  Oral Daily  . gabapentin  100 mg Oral TID  . insulin aspart  0-5 Units Subcutaneous QHS  . insulin aspart  0-9 Units Subcutaneous TID WC  . losartan  100 mg Oral Daily  . potassium chloride  20 mEq Oral BID  . sertraline  200 mg Oral Daily  . simvastatin  20 mg Oral QHS    Continuous Infusions:   Past Medical History  Diagnosis Date  . Hypertrophic cardiomyopathy   . Hypertension   . Situational mixed anxiety and depressive disorder   . Hypercholesterolemia   . Falls frequently     coumadin stopped  . Pulmonary HTN     has had prior right heart cath in 2010; was felt that most likely due to elevated left sided pressures and would not benefit from vasodilator therapy  . Oxygen dependent     "3L 24/7" (08/05/2013)  . COPD (chronic obstructive pulmonary disease)   . TIA (transient ischemic attack)   . Atrial fibrillation     Chronic; No longer on coumadin  . CHF (congestive heart failure)   . Pacemaker   . Family history of anesthesia complication     "they have to breath for mom for awhile after anesthesia" (08/05/2013)  . Heart murmur   . Varicose veins   . Pneumonia     "couple times; long time ago" (08/05/2013)  . Exertional shortness of breath   . Type II diabetes mellitus   . Anemia     "as a child" (08/05/2013)  . GERD (gastroesophageal reflux disease)   . H/O hiatal hernia   . Osteoarthritis   . Arthritis     "all over me; in all my joints" (08/05/2013)  . Coccyx pain   . Gout     "right foot" (08/05/2013)  . Anxiety   . Depression     Past Surgical History  Procedure Laterality Date  . Total knee arthroplasty Right 2011  . Tonsillectomy    . Appendectomy    . Abdominal hysterectomy      "partial" (08/05/2013)  . Dilation and curettage of uterus      "several" (08/05/2013)  . Foot neuroma surgery Right   . Insert / replace / remove pacemaker  1998; 2000's; 2014  . Cardiac catheterization      "more than once" (08/05/2013)  . Back surgery      "bone spur  removed; mid back" (08/05/2013)    Ian Malkin RD, LDN Inpatient Clinical Dietitian Pager: [phone] After Hours Pager: [phone]

## 2013-11-18 NOTE — Consult Note (Signed)
CARDIOLOGY CONSULT NOTE   Patient ID: [name] [name] MRN: [id number], DOB/AGE: 1936-08-02   Admit date: 11/17/2013 Date of Consult: 11/18/2013   Primary Physician: Lupe Carney, MD Primary Cardiologist: Dr Cassell Clement  Pt. Profile Syncope  Problem List  Past Medical History  Diagnosis Date  . Hypertrophic cardiomyopathy   . Hypertension   . Situational mixed anxiety and depressive disorder   . Hypercholesterolemia   . Falls frequently     coumadin stopped  . Pulmonary HTN     has had prior right heart cath in 2010; was felt that most likely due to elevated left sided pressures and would not benefit from vasodilator therapy  . Oxygen dependent     "3L 24/7" (08/05/2013)  . COPD (chronic obstructive pulmonary disease)   . TIA (transient ischemic attack)   . Atrial fibrillation     Chronic; No longer on coumadin  . CHF (congestive heart failure)   . Pacemaker   . Family history of anesthesia complication     "they have to breath for mom for awhile after anesthesia" (08/05/2013)  . Heart murmur   . Varicose veins   . Pneumonia     "couple times; long time ago" (08/05/2013)  . Exertional shortness of breath   . Type II diabetes mellitus   . Anemia     "as a child" (08/05/2013)  . GERD (gastroesophageal reflux disease)   . H/O hiatal hernia   . Osteoarthritis   . Arthritis     "all over me; in all my joints" (08/05/2013)  . Coccyx pain   . Gout     "right foot" (08/05/2013)  . Anxiety   . Depression     Past Surgical History  Procedure Laterality Date  . Total knee arthroplasty Right 2011  . Tonsillectomy    . Appendectomy    . Abdominal hysterectomy      "partial" (08/05/2013)  . Dilation and curettage of uterus      "several" (08/05/2013)  . Foot neuroma surgery Right   . Insert / replace / remove pacemaker  1998; 2000's; 2014  . Cardiac catheterization      "more than once" (08/05/2013)  . Back surgery      "bone spur removed; mid back" (08/05/2013)      Allergies  Allergies  Allergen Reactions  . Aspirin     Upsets stomach really bad  . Calan [Verapamil Hcl] Other (See Comments)    Patient states it makes her "out of her mind"; bp bottoms out  . Crestor [Rosuvastatin Calcium] Other (See Comments)    Muscle pain  . Escitalopram Oxalate Other (See Comments)    Unknown   . Lipitor [Atorvastatin Calcium] Other (See Comments)    myalgia  . Lopressor [Metoprolol Tartrate] Other (See Comments)    Blood pressure bottoms out   . Nitroglycerin   . Norvasc [Amlodipine Besylate] Other (See Comments)    fatigue  . Nsaids Nausea Only  . Oxycodone   . Prednisone     SWELLING  . Sulfa Antibiotics Nausea Only and Other (See Comments)    Makes her stomach hurt  . Vimovo [Naproxen-Esomeprazole] Nausea Only  . Penicillins Hives and Rash    HPI   This is a pleasant 78 year old Caucasian female who has been followed by Dr [name] [name]. For the last two years she has been experiencing falls that are increasing in frequency. Neurology work up has been negative. Several approaches were tried in the past, but it seems  that the falls continue with or without more tight BP control.  She has been in chronic atrial fibrillation that is rate controlled and on no anticoagulation secondary to her frequent falls. For the past 2 months she has either been in the hospital or in a rehabilitation center. She returned home only couple of weeks ago. She was in the hospital because of frequent falls. She does not have any warning before she falls. She states that she just appears to lose her balance. On one occasion she did hit her head and had a prior sutures over her left eyebrow. She was also told that she had a concussion.  She was admitted yesterday after another fall and diagnosed with L4 compression fracture. The patient denies any chest pain, SOB, palpitations prior to the fall or since then.   She has known LVH. She had an echocardiogram on 03/01/13 and  showed severe pulmonary hypertension with PA pressure 82 mmHg. her most recent echocardiogram of 08/06/13 showed an ejection fraction of 60-65% and her pulmonary artery pressure was 71 mmHg Her left ventricular ejection fraction was 60-65% and there was mild LVH. She has a past history of hypertrophic cardiomyopathy although this was not demonstrated on her most recent echo.  Inpatient Medications  . bisoprolol  10 mg Oral Daily  . budesonide-formoterol  2 puff Inhalation BID  . buPROPion  300 mg Oral Daily  . cefTRIAXone (ROCEPHIN)  IV  1 g Intravenous Q24H  . diltiazem  120 mg Oral BID  . furosemide  20 mg Oral Daily  . gabapentin  100 mg Oral TID  . insulin aspart  0-5 Units Subcutaneous QHS  . insulin aspart  0-9 Units Subcutaneous TID WC  . losartan  100 mg Oral Daily  . potassium chloride  20 mEq Oral BID  . sertraline  200 mg Oral Daily  . simvastatin  20 mg Oral QHS    Family History Family History  Problem Relation Age of Onset  . Other Father     died in plane crash  . Cancer Mother     lymphoma-NHL  . Coronary artery disease Grandchild   . Heart disease Maternal Grandmother      Social History History   Social History  . Marital Status: Widowed    Spouse Name: N/A    Number of Children: 2  . Years of Education: 12th   Occupational History  .      Retired   Social History Main Topics  . Smoking status: Former Smoker -- 1.00 packs/day for 40 years    Types: Cigarettes    Quit date: 11/15/2005  . Smokeless tobacco: Never Used  . Alcohol Use: No  . Drug Use: No  . Sexual Activity: No   Other Topics Concern  . Not on file   Social History Narrative   Former smoker   OrthoptistHousewife and part-time since married. Patient is widowed.      Daughter- [name] Reasons(C) [name] [phone]      Right handed.  Patient has high school education.         Review of Systems  General:  No chills, fever, night sweats or weight changes.  Cardiovascular:  No chest pain, dyspnea on  exertion, edema, orthopnea, palpitations, paroxysmal nocturnal dyspnea. Dermatological: No rash, lesions/masses Respiratory: No cough, dyspnea Urologic: No hematuria, dysuria Abdominal:   No nausea, vomiting, diarrhea, bright red blood per rectum, melena, or hematemesis Neurologic:  No visual changes, wkns, changes in mental status. All other  systems reviewed and are otherwise negative except as noted above.  Physical Exam  Blood pressure 152/74, pulse 73, temperature 98.1 F (36.7 C), temperature source Oral, resp. rate 18, height 5\' 4"  (1.626 m), weight 126 lb 12.2 oz (57.5 kg), SpO2 98.00%.  General: Pleasant, fragile elderly female, in mild pain Psych: Normal affect. Neuro: Alert and oriented X 3. Moves all extremities spontaneously. HEENT: Normal  Neck: Supple without bruits or JVD. Lungs:  Resp regular and unlabored, CTA. Heart: RRR no s3, s4, or murmurs. Abdomen: Soft, non-tender, non-distended, BS + x 4.  Extremities: No clubbing, cyanosis or edema. DP/PT/Radials 2+ and equal bilaterally. Bruises over her back  Labs  No results found for this basename: CKTOTAL, CKMB, TROPONINI,  in the last 72 hours Lab Results  Component Value Date   WBC 8.3 11/17/2013   HGB 15.0 11/17/2013   HCT 44.0 11/17/2013   MCV 97.8 11/17/2013   PLT 254 11/17/2013    Recent Labs Lab 11/17/13 1843  NA 143  K 3.9  CL 99  CO2 27  BUN 15  CREATININE 0.73  CALCIUM 10.1  GLUCOSE 161*   Lab Results  Component Value Date   CHOL 154 08/05/2013   HDL 52 08/05/2013   LDLCALC 78 08/05/2013   TRIG 122 08/05/2013   Lab Results  Component Value Date   DDIMER 0.36 05/08/2013   No components found with this basename: POCBNP,   Radiology/Studies  Ct Head Wo Contrast  11/17/2013   IMPRESSION: 1. No evidence of acute intracranial or cervical spine injury. 2. Pre-existing T1 and T2 compression fractures, with incomplete healing at the T1 level. No interval worsening of the mild compression.    Ct Lumbar  Spine Wo Contrast  11/18/2013   CLINICAL DATA:   IMPRESSION: Comminuted L4 body fracture which is unhealed, but likely non-acute. Height loss is up to 60%; moderate bony retropulsion and spinal canal stenosis.     Echocardiogram - Study Conclusions 08/06/2013  - Left ventricle: The cavity size was normal. Wall thickness was increased in a pattern of mild LVH. Systolic function was normal. The estimated ejection fraction was in the range of 60% to 65%. Although no diagnostic regional wall motion abnormality was identified, this possibility cannot be completely excluded on the basis of this study. Doppler parameters are consistent with restrictive physiology, indicative of decreased left ventricular diastolic compliance and/or increased left atrial pressure. - Aortic valve: There was no stenosis. - Mitral valve: Trivial regurgitation. - Left atrium: The atrium was moderately dilated. - Right ventricle: The cavity size was normal. Systolic function was mildly reduced. - Right atrium: The atrium was mildly dilated. - Tricuspid valve: Peak gradient:62mm Hg (D). - Pulmonary arteries: PA peak pressure: 71mm Hg (S). - Systemic veins: IVC not visualized. - Pericardium, extracardiac: A trivial pericardial effusion was identified posterior to the heart. Impressions:  - Normal LV size with mild LV hypertrophy. EF 60-65%. Restrictive diastolic function. Normal RV size with mildly decreased systolic function. Severe pulmonary hypertension.   ECG: A fib, rate controlled, HR 83/minute, LVH with significant repolarization abnormalities    ASSESSMENT AND PLAN  Syncope and collapse - it appears that all of the patients fall happen while at standing position -The approaches with more or less tight BP control were tried and inefficient. Also she has h/o HCM, but the last echo didn't demonstrate it and din't show LVOT obstruction. These episodes are most probably exrtional syncopes secondary to  severe pulmonary hypertension due to the inability  to increase cardiac output during activity or reflex bradycardia that is secondary to mechanoreceptor activation in the right ventricle.  We will continue to monitor telemetry for possible arrhythmias.  Avoid dehydration. She is currently euvolemic.   2. Hypertension - probably related to ongoing pain, we will continue the same regimen and follow   Signed, Lars Masson, MD, [facility] 11/18/2013, 9:46 AM

## 2013-11-19 ENCOUNTER — Inpatient Hospital Stay (HOSPITAL_COMMUNITY): Payer: Medicare Other

## 2013-11-19 DIAGNOSIS — N390 Urinary tract infection, site not specified: Secondary | ICD-10-CM | POA: Diagnosis present

## 2013-11-19 LAB — URINE CULTURE: Colony Count: >=100000

## 2013-11-19 LAB — GLUCOSE, CAPILLARY
Glucose-Capillary: 136 mg/dL — ABNORMAL HIGH (ref 70–99)
Glucose-Capillary: 95 mg/dL (ref 70–99)
Glucose-Capillary: 85 mg/dL (ref 70–99)
Glucose-Capillary: 131 mg/dL — ABNORMAL HIGH (ref 70–99)

## 2013-11-19 MED ORDER — ASPIRIN 81 MG PO CHEW
81.0000 mg | CHEWABLE_TABLET | Freq: Every day | ORAL | Status: DC
  Administered 2013-11-19 – 2013-11-20 (×2): 81 mg via ORAL
  Filled 2013-11-19 (×2): qty 1

## 2013-11-19 MED ORDER — IOHEXOL 350 MG/ML SOLN
50.0000 mL | Freq: Once | INTRAVENOUS | Status: AC | PRN
  Administered 2013-11-19: 50 mL via INTRAVENOUS

## 2013-11-19 MED ORDER — ZOLPIDEM TARTRATE 5 MG PO TABS
5.0000 mg | ORAL_TABLET | Freq: Every evening | ORAL | Status: DC | PRN
  Administered 2013-11-19 (×2): 5 mg via ORAL
  Filled 2013-11-19 (×2): qty 1

## 2013-11-19 NOTE — Progress Notes (Signed)
PT Cancellation Note  Patient Details Name: [name] [name] MRN: [id number] DOB: 02/20/1936   Cancelled Treatment:    Reason Eval/Treat Not Completed: Medical issues which prohibited therapy.  MD notes state "acute L4 fracture with questionable retropulsion on plain film".  Will need clarification if pt is appropriate for mobility at this time or if need to wait for further evaluation of L4 fx.  Spoke with RN about this and will f/u another time.     Edrees Valent, Alison MurrayMegan F 11/19/2013, 9:13 AM

## 2013-11-19 NOTE — Progress Notes (Signed)
Patient Name: [name] HummingbirdMae L Palencia Date of Encounter: 11/19/2013   Principal Problem:   Syncope Active Problems:   Type II or unspecified type diabetes mellitus without mention of complication, uncontrolled   COPD (chronic obstructive pulmonary disease)   Compression fracture of L4 lumbar vertebra   Protein-calorie malnutrition, severe   UTI (urinary tract infection)   HYPERLIPIDEMIA   Atrial fibrillation   Benign hypertensive heart disease without heart failure   Pulmonary HTN   Unsteady gait   SUBJECTIVE  No chest pain, sob, or recurrent syncope.  Pt describes event from the other night as sudden syncope with no memory of falling to the floor.  She had a similar event about 3 wks ago.  No events on tele - afib with pod.  C/o mild back pain.  CURRENT MEDS . bisoprolol  10 mg Oral Daily  . budesonide-formoterol  2 puff Inhalation BID  . buPROPion  300 mg Oral Daily  . cefTRIAXone (ROCEPHIN)  IV  1 g Intravenous Q24H  . diltiazem  120 mg Oral BID  . feeding supplement (ENSURE)  1 Container Oral Q24H  . feeding supplement (RESOURCE BREEZE)  1 Container Oral Q24H  . furosemide  20 mg Oral Daily  . gabapentin  100 mg Oral TID  . insulin aspart  0-5 Units Subcutaneous QHS  . insulin aspart  0-9 Units Subcutaneous TID WC  . losartan  100 mg Oral Daily  . potassium chloride  20 mEq Oral BID  . sertraline  200 mg Oral Daily  . simvastatin  20 mg Oral QHS    OBJECTIVE  Filed Vitals:   11/18/13 1400 11/18/13 2059 11/18/13 2109 11/19/13 0603  BP: 157/67  161/66 171/85  Pulse: 86  76 87  Temp: 98 F (36.7 C)  98.3 F (36.8 C) 98 F (36.7 C)  TempSrc:   Oral Oral  Resp:   18 18  Height:      Weight:      SpO2: 100% 100% 99% 98%   No intake or output data in the 24 hours ending 11/19/13 1051 Filed Weights   11/18/13 0200  Weight: 126 lb 12.2 oz (57.5 kg)    PHYSICAL EXAM  General: Pleasant, NAD. Neuro: Alert and oriented X 3. Moves all extremities spontaneously. Psych:  Normal affect. HEENT:  Normal  Neck: Supple without bruits or JVD. Lungs:  Resp regular and unlabored, CTA. Heart: irreg, irreg, no s3, s4, or murmurs. Abdomen: Soft, non-tender, non-distended, BS + x 4.  Extremities: No clubbing, cyanosis or edema. DP/PT/Radials 2+ and equal bilaterally.  Accessory Clinical Findings  CBC  Recent Labs  11/17/13 1843  WBC 8.3  HGB 15.0  HCT 44.0  MCV 97.8  PLT 254   Basic Metabolic Panel  Recent Labs  11/17/13 1843  NA 143  K 3.9  CL 99  CO2 27  GLUCOSE 161*  BUN 15  CREATININE 0.73  CALCIUM 10.1   Cardiac Enzymes  Recent Labs  11/18/13 0956 11/18/13 1414 11/18/13 2024  TROPONINI <0.30 <0.30 <0.30   Hemoglobin A1C  Recent Labs  11/18/13 0956  HGBA1C 6.4*   TELE  Afib, pod.  Radiology/Studies  Dg Lumbar Spine 2-3 Views  11/17/2013   CLINICAL DATA:  Fall  EXAM: LUMBAR SPINE - 2-3 VIEW .  IMPRESSION: New L4 compression fracture as described. Retropulsion of the superior endplate is suspected.   Electronically Signed   By: Maryclare BeanArt  Hoss M.D.   On: 11/17/2013 21:50   Dg Pelvis  1-2 Views  11/17/2013   CLINICAL DATA:  Fall  EXAM: PELVIS - 1-2 VIEW  IMPRESSION: No acute bony injury in the pelvis or femurs.   Electronically Signed   By: Maryclare Bean M.D.   On: 11/17/2013 21:51   Ct Cervical Spine Wo Contrast  11/17/2013   CLINICAL DATA:  Fall.  Back bruising  EXAM: CT HEAD WITHOUT CONTRAST  CT CERVICAL SPINE WITHOUT CONTRAST   IMPRESSION: 1. No evidence of acute intracranial or cervical spine injury. 2. Pre-existing T1 and T2 compression fractures, with incomplete healing at the T1 level. No interval worsening of the mild compression.   Electronically Signed   By: Tiburcio Pea M.D.   On: 11/17/2013 21:36   Ct Thoracic Spine Wo Contrast  11/17/2013   CLINICAL DATA:  Follow  EXAM: CT THORACIC SPINE WITHOUT CONTRAST   IMPRESSION: Stable T1, T2, T3, and T8 compression fractures with sclerotic changes.  There is sclerosis within the  manubrium suggesting prior injury with evidence healing. It is a nonspecific finding.   Electronically Signed   By: Maryclare Bean M.D.   On: 11/17/2013 21:27   Ct Lumbar Spine Wo Contrast  11/18/2013   CLINICAL DATA:  Fall.  EXAM: CT LUMBAR SPINE WITHOUT CONTRAST  IMPRESSION: Comminuted L4 body fracture which is unhealed, but likely non-acute. Height loss is up to 60%; moderate bony retropulsion and spinal canal stenosis.   Electronically Signed   By: Tiburcio Pea M.D.   On: 11/18/2013 01:29   2D Echocardiogram 1.8.2015  Study Conclusions  - Left ventricle: Difficult acoustic windows limit study.   Overall LVEF is approximately 55 to 60% with basal   inferior hypokinesis. The cavity size was normal. - Left atrium: The atrium was severely dilated. - Right ventricle: Systolic function was mildly reduced. - Right atrium: The atrium was moderately to severely   dilated. - Pulmonary arteries: PA peak pressure: 60mm Hg (S). - Pericardium, extracardiac: A small pericardial effusion   was identified. _____________   ASSESSMENT AND PLAN  1.  Syncope:  Tele reviewed - afib w/o tachyarrhythmias.  Pacing on demand.  Orthostatic VS yesterday showed slight drop in BP (157->147) from lying to sitting, but elevation back to 158 with standing.  Interrogation of MDT PPM in ER showed normal device fxn w/o any high ventricular rate episodes to explain most recent syncope.  Notably, she did have 8 reports of HVR on 08/15/2013 (longest :13) - likely represents afib beat-beat conduction variation.  With NL LV fxn on echo yesterday w/o evidence of hypertrophy, VT much less likely.  Echo also continues to show pulmonary htn, which may be playing a role in syncope.  Continue to avoid dehydration.  No further inpt cardiac w/u necessary.    2.  HTN:  bp's running high.  Reluctant to adjust meds given above.  3.  Pulm HTN:  PASP by echo yesterday.  4. Compression fractures:  Thoracic and lumbar  - subacute. Pain  mgmt per IM.  5. Afib:  Rate controlled on bb/dilt.  No oral anticoagulation 2/2 falls, unsteady gait, syncope.  Signed, Nicolasa Ducking NP As above; patient seen and examined; as outlined above, pacemaker interrogated with normal function and no high ventricular rates; LV function normal. Would continue preadmission meds; no coumadin given recurrent falls; resume ASA 81 mg daily; patient can be DCed from a cardiac standpoint and fu with Dr Patty Sermons. Please call with questions. Olga Millers

## 2013-11-19 NOTE — Evaluation (Signed)
Physical Therapy Evaluation Patient Details Name: [name] [name] MRN: [id number] DOB: 1936-04-13 Today's Date: 11/19/2013 Time: 1411-1430 PT Time Calculation (min): 19 min  PT Assessment / Plan / Recommendation History of Present Illness  pt presents with falls and old vertebral compression fxs.    Clinical Impression  Pt generally weak and unsteady during all mobility.  Pt tells PT that she has been having blurry vision and difficulty grabbing objects that are right in front of her.  Pt demos this for PT and is clearing under and over-shooting target.  Pt also relates that she put her hands together behind her head yesterday and that she thought she was holding someone else's hand and not her own.  Pt also painful to touch in her low back.  MD and RN made aware of above.  Will continue to follow.      PT Assessment  Patient needs continued PT services    Follow Up Recommendations  SNF    Does the patient have the potential to tolerate intense rehabilitation      Barriers to Discharge Decreased caregiver support      Equipment Recommendations  None recommended by PT    Recommendations for Other Services OT consult   Frequency Min 3X/week    Precautions / Restrictions Precautions Precautions: Fall Restrictions Weight Bearing Restrictions: No   Pertinent Vitals/Pain Low back painful to touch.  RN made aware.       Mobility  Transfers Overall transfer level: Needs assistance Equipment used: Rolling walker (2 wheeled) Transfers: Sit to/from Stand Sit to Stand: Min assist General transfer comment: pt retropulsive with coming to stand requiring MinA to wt shift anteriorly and maintain balance.   Ambulation/Gait Ambulation/Gait assistance: Min assist Ambulation Distance (Feet): 80 Feet Assistive device: Rolling walker (2 wheeled) Gait Pattern/deviations: Step-through pattern;Shuffle;Wide base of support;Trunk flexed General Gait Details: pt with dufficulty keeping L LE within RW  base and drifts towards R side requiring A to prevent running into wall and objects on R.      Exercises     PT Diagnosis: Difficulty walking;Acute pain  PT Problem List: Decreased strength;Decreased activity tolerance;Decreased balance;Decreased coordination;Decreased mobility;Decreased cognition;Decreased knowledge of use of DME;Decreased safety awareness;Impaired sensation;Pain PT Treatment Interventions: DME instruction;Gait training;Stair training;Functional mobility training;Therapeutic activities;Therapeutic exercise;Balance training;Neuromuscular re-education;Patient/family education     PT Goals(Current goals can be found in the care plan section) Acute Rehab PT Goals Patient Stated Goal: Home PT Goal Formulation: With patient Time For Goal Achievement: 12/03/13 Potential to Achieve Goals: Fair  Visit Information  Last PT Received On: 11/19/13 Assistance Needed: +1 History of Present Illness: pt presents with falls and old vertebral compression fxs.         Prior Functioning  Home Living Family/patient expects to be discharged to:: Skilled nursing facility Prior Function Level of Independence: Independent with assistive device(s) Comments: pt uses cane and/or walker PRN.  pt states she has someone clean when she wants a "big cleaning".   Communication Communication: No difficulties Dominant Hand: Right    Cognition  Cognition Arousal/Alertness: Awake/alert Behavior During Therapy: WFL for tasks assessed/performed Overall Cognitive Status: Impaired/Different from baseline Area of Impairment: Memory Memory: Decreased short-term memory General Comments: Unclear baseline, however pt with difficulty with memory.      Extremity/Trunk Assessment Upper Extremity Assessment Upper Extremity Assessment: LUE deficits/detail;RUE deficits/detail RUE Deficits / Details: pt states pain in R should and biceps area during WBing activity.   LUE Deficits / Details: pt indicates L  pinky area  feels tingling and slightly dull.   LUE Sensation: decreased light touch Lower Extremity Assessment Lower Extremity Assessment: Generalized weakness;LLE deficits/detail LLE Deficits / Details: pt with decreased coordination on L LE stubbing her toes on RW multiple times.   LLE Sensation: decreased light touch LLE Coordination: decreased fine motor   Balance Balance Overall balance assessment: Needs assistance Standing balance support: Bilateral upper extremity supported Standing balance-Leahy Scale: Fair Standing balance comment: pt retropulsive and requires MinA to prevent fall posteriorly.    End of Session PT - End of Session Equipment Utilized During Treatment: Gait belt Activity Tolerance: Patient tolerated treatment well Patient left: in chair;with call bell/phone within reach Nurse Communication: Mobility status  GP     Sunny SchleinRitenour, Erin Uecker F, [place] CarolinaPT [phone] 11/19/2013, 2:43 PM

## 2013-11-19 NOTE — Progress Notes (Addendum)
Patient ID: [name] [name]  female  ZOX:[id number]    DOB: 07/16/1936    DOA: 11/17/2013  PCP: Lupe CarneyMITCHELL,DEAN, MD  Addendum  Called by the physical therapist that patient was having complaints of blurry vision, shooting off the target, gait instability. I went to examine the patient and she reported that she's been having blurry vision for last 2-3 weeks. She was planning to have ophthalmology examination. Patient does not have any focal neurological on the neuro examination.  Due to recurrent syncope, gait instability, will rule out any underlying intracranial abnormality or stroke. Patient has pacemaker, cannot receive MRI. Will obtain CT angiogram head.     Davied Nocito M.D. [facility] 11/19/2013, 2:58 PM  Pager: [phone]    Assessment/Plan: Principal Problem:   Syncope: without any aura, denies any dizziness as lightheadedness, mostly with exertion. She also has a history of severe pulmonary hypertension with restrictive diastolic dysfunction, PA pressure 71 - Per patient multiple syncopal episodes at least 5 of them last month at the facility while she was undergoing rehabilitation and once PTA which brought her to the hospital. Patient reports waking up on the floor. - Troponins negative so far. EKG however shows atrial fibrillation with marked T wave inversions in leads 2, aVL, V3 to V6 however she also has the same T-wave inversion in EKG from 11 2014 -  Cardiology consulted, 2-D echo done with a normal LV function, pulmonary hypertension. Pacemaker interrogated to normal function and no high ventricular rate. No Coumadin given the recurrent falls, continue aspirin 81 mg daily stable from cardiac standpoint.   Atrial fibrillation: Seen on the EKG, not new. She has a history of frequent falls, hence not anticoagulation candidate.  - On Cardizem and beta blocker  - Continue aspirin 81 mg daily  Active Problems:   Type II or unspecified type diabetes mellitus without mention of  complication, uncontrolled: ? Autonomic dysfunction - Continue sliding scale insulin    Pulmonary HTN    COPD (chronic obstructive pulmonary disease) - Currently stable, no acute wheezing    Compression fracture of L4 lumbar vertebra: On CT of the lumbar spine does not appear to be acute - PTOT evaluation, denies any acute pain, patient lives at home alone hence needs physical therapy evaluation if she needs skilled nursing facility versus will be safe with intermittent supervision. She does have a history of frequent falls raising safety issue.    Protein-calorie malnutrition, severe - Nutrition consult  DVT Prophylaxis:SCDs  Code Status:  Family Communication:  Disposition: TBD    Subjective: Denies any nausea, vomiting, abdominal pain or chest pain, no dizziness, lightheadedness. Denies any acute back pain  Objective: Weight change:   Intake/Output Summary (Last 24 hours) at 11/19/13 1400 Last data filed at 11/19/13 1221  Gross per 24 hour  Intake    220 ml  Output      0 ml  Net    220 ml   Blood pressure 171/85, pulse 87, temperature 98 F (36.7 C), temperature source Oral, resp. rate 18, height 5\' 4"  (1.626 m), weight 57.5 kg (126 lb 12.2 oz), SpO2 98.00%.  Physical Exam: General: Alert and awake, oriented x3, not in any acute distress. CVS: S1-S2 clear Chest: clear to auscultation bilaterally, no wheezing, rales or rhonchi Abdomen: soft nontender, nondistended, normal bowel sounds  Extremities: no cyanosis, clubbing or edema noted bilaterally Neuro: Cranial nerves II-XII intact, no focal neurological deficits  Lab Results: Basic Metabolic Panel:  Recent Labs Lab 11/17/13 1843  NA  143  K 3.9  CL 99  CO2 27  GLUCOSE 161*  BUN 15  CREATININE 0.73  CALCIUM 10.1   Liver Function Tests: No results found for this basename: AST, ALT, ALKPHOS, BILITOT, PROT, ALBUMIN,  in the last 168 hours No results found for this basename: LIPASE, AMYLASE,  in the last  168 hours No results found for this basename: AMMONIA,  in the last 168 hours CBC:  Recent Labs Lab 11/17/13 1843  WBC 8.3  HGB 15.0  HCT 44.0  MCV 97.8  PLT 254   Cardiac Enzymes:  Recent Labs Lab 11/18/13 0956 11/18/13 1414 11/18/13 2024  TROPONINI <0.30 <0.30 <0.30   BNP: No components found with this basename: POCBNP,  CBG:  Recent Labs Lab 11/18/13 1258 11/18/13 1716 11/18/13 2103 11/19/13 0734 11/19/13 1216  GLUCAP 76 184* 118* 136* 95     Micro Results: Recent Results (from the past 240 hour(s))  URINE CULTURE     Status: None   Collection Time    11/17/13 10:04 PM      Result Value Range Status   Specimen Description URINE, CLEAN CATCH   Final   Special Requests NONE   Final   Culture  Setup Time     Final   Value: 11/18/2013 04:05     Performed at [facility] Count     Final   Value: >=100,000 COLONIES/ML     Performed at [facility]   Culture     Final   Value: ESCHERICHIA COLI     Performed at [facility]   Report Status 11/19/2013 FINAL   Final   Organism ID, Bacteria ESCHERICHIA COLI   Final    Studies/Results: Dg Lumbar Spine 2-3 Views  11/17/2013   CLINICAL DATA:  Fall  EXAM: LUMBAR SPINE - 2-3 VIEW  COMPARISON:  02/05/2013  FINDINGS: There is a new L4 compression fracture with at least 60% loss of height anteriorly and centrally. Retropulsion of the superior endplate is suspected. Osteopenia. Severe narrowing of the L5-S1 disc is demonstrated.  IMPRESSION: New L4 compression fracture as described. Retropulsion of the superior endplate is suspected.   Electronically Signed   By: Maryclare Bean M.D.   On: 11/17/2013 21:50   Dg Pelvis 1-2 Views  11/17/2013   CLINICAL DATA:  Fall  EXAM: PELVIS - 1-2 VIEW  COMPARISON:  None.  FINDINGS: L4 compression fracture is noted. Osteopenia. Otherwise no acute fracture or dislocation.  IMPRESSION: No acute bony injury in the pelvis or femurs.   Electronically Signed   By: Maryclare Bean M.D.   On: 11/17/2013 21:51   Ct Head Wo Contrast  11/17/2013   CLINICAL DATA:  Fall.  Back bruising  EXAM: CT HEAD WITHOUT CONTRAST  CT CERVICAL SPINE WITHOUT CONTRAST  TECHNIQUE: Multidetector CT imaging of the head and cervical spine was performed following the standard protocol without intravenous contrast. Multiplanar CT image reconstructions of the cervical spine were also generated.  COMPARISON:  10/01/2013  FINDINGS: CT HEAD FINDINGS  Skull and Sinuses:Right occipital scalp contusion without underlying fracture.  Orbits: No acute abnormality.  Brain: No evidence of acute abnormality, such as acute infarction, hemorrhage, hydrocephalus, or mass lesion/mass effect. Stable pattern of chronic small vessel ischemic change with patchy bilateral cerebral white matter low density.  CT CERVICAL SPINE FINDINGS  No acute fracture identified. No acute subluxation. There are T1 and T2 superior endplate fractures which were seen previously. Height loss is  mild, on the order of 10%. Fracture lucency in the upper T1 body is still readily visible. T2 is superior endplate changes are predominantly sclerotic. No interval worsening height loss. No posterior element fracturing. There is a band of sclerosis in the upper T3 body, reference dedicated thoracic spine imaging.  IMPRESSION: 1. No evidence of acute intracranial or cervical spine injury. 2. Pre-existing T1 and T2 compression fractures, with incomplete healing at the T1 level. No interval worsening of the mild compression.   Electronically Signed   By: Tiburcio Pea M.D.   On: 11/17/2013 21:36   Ct Cervical Spine Wo Contrast  11/17/2013   CLINICAL DATA:  Fall.  Back bruising  EXAM: CT HEAD WITHOUT CONTRAST  CT CERVICAL SPINE WITHOUT CONTRAST  TECHNIQUE: Multidetector CT imaging of the head and cervical spine was performed following the standard protocol without intravenous contrast. Multiplanar CT image reconstructions of the cervical spine were also  generated.  COMPARISON:  10/01/2013  FINDINGS: CT HEAD FINDINGS  Skull and Sinuses:Right occipital scalp contusion without underlying fracture.  Orbits: No acute abnormality.  Brain: No evidence of acute abnormality, such as acute infarction, hemorrhage, hydrocephalus, or mass lesion/mass effect. Stable pattern of chronic small vessel ischemic change with patchy bilateral cerebral white matter low density.  CT CERVICAL SPINE FINDINGS  No acute fracture identified. No acute subluxation. There are T1 and T2 superior endplate fractures which were seen previously. Height loss is mild, on the order of 10%. Fracture lucency in the upper T1 body is still readily visible. T2 is superior endplate changes are predominantly sclerotic. No interval worsening height loss. No posterior element fracturing. There is a band of sclerosis in the upper T3 body, reference dedicated thoracic spine imaging.  IMPRESSION: 1. No evidence of acute intracranial or cervical spine injury. 2. Pre-existing T1 and T2 compression fractures, with incomplete healing at the T1 level. No interval worsening of the mild compression.   Electronically Signed   By: Tiburcio Pea M.D.   On: 11/17/2013 21:36   Ct Thoracic Spine Wo Contrast  11/17/2013   CLINICAL DATA:  Follow  EXAM: CT THORACIC SPINE WITHOUT CONTRAST  TECHNIQUE: Multidetector CT imaging of the thoracic spine was performed without intravenous contrast administration. Multiplanar CT image reconstructions were also generated.  COMPARISON:  10/12/2013  FINDINGS: Stable T1, T2, T3, and T8 compression fractures with sclerotic changes. Stable chronic superior depression and T5. No new compression fracture. Sclerotic changes in the inferior manubrium likely represent a prior fracture with evidence of healing.  Chronic changes in the lung parenchyma are stable. Stable appearance of the heart with marked enlargement of the left atrium and left ventricle.  IMPRESSION: Stable T1, T2, T3, and T8  compression fractures with sclerotic changes.  There is sclerosis within the manubrium suggesting prior injury with evidence healing. It is a nonspecific finding.   Electronically Signed   By: Maryclare Bean M.D.   On: 11/17/2013 21:27   Ct Lumbar Spine Wo Contrast  11/18/2013   CLINICAL DATA:  Fall.  EXAM: CT LUMBAR SPINE WITHOUT CONTRAST  TECHNIQUE: Multidetector CT imaging of the lumbar spine was performed without intravenous contrast administration. Multiplanar CT image reconstructions were also generated.  COMPARISON:  Lumbar spine radiography 04/18/2011.  FINDINGS: L4 vertebral body fracture with comminution. Height loss is up to 60% centrally. There is bony retropulsion which causes AP canal narrowing to 8 mm. No subluxation or posterior element fracturing. The body appears sclerotic, but the fracture margins are still clearly visible.  Diffuse degenerative disc and facet disease, with disc narrowing most notable at L5-S1 were there is disc bulging and endplate spurring narrows the left foramen. There has been previous left-sided L4-5 laminotomy.  Osteopenia.  Abdominal aortic atherosclerosis. Minimally imaged exophytic lesion from the right kidney, also seen in 2008.  IMPRESSION: Comminuted L4 body fracture which is unhealed, but likely non-acute. Height loss is up to 60%; moderate bony retropulsion and spinal canal stenosis.   Electronically Signed   By: Tiburcio Pea M.D.   On: 11/18/2013 01:29    Medications: Scheduled Meds: . aspirin  81 mg Oral Daily  . bisoprolol  10 mg Oral Daily  . budesonide-formoterol  2 puff Inhalation BID  . buPROPion  300 mg Oral Daily  . cefTRIAXone (ROCEPHIN)  IV  1 g Intravenous Q24H  . diltiazem  120 mg Oral BID  . feeding supplement (ENSURE)  1 Container Oral Q24H  . feeding supplement (RESOURCE BREEZE)  1 Container Oral Q24H  . furosemide  20 mg Oral Daily  . gabapentin  100 mg Oral TID  . insulin aspart  0-5 Units Subcutaneous QHS  . insulin aspart  0-9  Units Subcutaneous TID WC  . losartan  100 mg Oral Daily  . potassium chloride  20 mEq Oral BID  . sertraline  200 mg Oral Daily  . simvastatin  20 mg Oral QHS      LOS: 2 days   Brentin Shin M.D. [facility] 11/19/2013, 2:00 PM Pager: [phone]  If 7PM-7AM, please contact night-coverage [email or web address] Password [email or web address]

## 2013-11-20 DIAGNOSIS — J449 Chronic obstructive pulmonary disease, unspecified: Secondary | ICD-10-CM

## 2013-11-20 DIAGNOSIS — M549 Dorsalgia, unspecified: Secondary | ICD-10-CM

## 2013-11-20 DIAGNOSIS — I4891 Unspecified atrial fibrillation: Secondary | ICD-10-CM

## 2013-11-20 DIAGNOSIS — S32009A Unspecified fracture of unspecified lumbar vertebra, initial encounter for closed fracture: Secondary | ICD-10-CM

## 2013-11-20 DIAGNOSIS — R269 Unspecified abnormalities of gait and mobility: Secondary | ICD-10-CM

## 2013-11-20 LAB — GLUCOSE, CAPILLARY
Glucose-Capillary: 133 mg/dL — ABNORMAL HIGH (ref 70–99)
Glucose-Capillary: 106 mg/dL — ABNORMAL HIGH (ref 70–99)

## 2013-11-20 MED ORDER — ENSURE PUDDING PO PUDG: 1.0000 | ORAL | Status: DC

## 2013-11-20 MED ORDER — MECLIZINE HCL 12.5 MG PO TABS
12.5000 mg | ORAL_TABLET | Freq: Three times a day (TID) | ORAL | Status: DC | PRN
  Administered 2013-11-20: 12.5 mg via ORAL
  Filled 2013-11-20: qty 1

## 2013-11-20 MED ORDER — CEFUROXIME AXETIL 500 MG PO TABS: 500.0000 mg | ORAL_TABLET | Freq: Two times a day (BID) | ORAL | Status: DC

## 2013-11-20 MED ORDER — MECLIZINE HCL 12.5 MG PO TABS: 12.5000 mg | ORAL_TABLET | Freq: Three times a day (TID) | ORAL | Status: DC | PRN

## 2013-11-20 NOTE — Progress Notes (Signed)
DC instructions reviewed with patient and her sister, questions answered, verbalized understanding.  Patient has been set up for home health.  Patient transported to front of hospital via wheelchair accompanied by RN to be taken home by sister.  Patient in good condition upon leaving [facility].

## 2013-11-20 NOTE — Discharge Summary (Signed)
[facility]                                                                                   [name] [name], is a 78 y.o. female  DOB 1936/02/10  MRN [id number].  Admission date:  11/17/2013  Admitting Physician  Hillary Bow, DO  Discharge Date:  11/20/2013   Primary MD  Lupe Carney, MD  Recommendations for primary care physician for things to follow:   Monitor clinically   Admission Diagnosis  Syncope and collapse [780.2] Gait difficulty [781.2] Pacemaker [V45.01] Pulmonary hypertension [416.8]  Discharge Diagnosis   Vertigo  Principal Problem:   Syncope Active Problems:   HYPERLIPIDEMIA   Atrial fibrillation   Type II or unspecified type diabetes mellitus without mention of complication, uncontrolled   Benign hypertensive heart disease without heart failure   Pulmonary HTN   COPD (chronic obstructive pulmonary disease)   Unsteady gait   Compression fracture of L4 lumbar vertebra   Protein-calorie malnutrition, severe   UTI (urinary tract infection)      Past Medical History  Diagnosis Date  . Hypertrophic cardiomyopathy   . Hypertension   . Situational mixed anxiety and depressive disorder   . Hypercholesterolemia   . Falls frequently     coumadin stopped  . Pulmonary HTN     has had prior right heart cath in 2010; was felt that most likely due to elevated left sided pressures and would not benefit from vasodilator therapy  . Oxygen dependent     "3L 24/7" (08/05/2013)  . COPD (chronic obstructive pulmonary disease)   . TIA (transient ischemic attack)   . Atrial fibrillation     Chronic; No longer on coumadin  . CHF (congestive heart failure)   . Pacemaker   . Family history of anesthesia complication     "they have to breath for mom for awhile after anesthesia" (08/05/2013)  . Heart murmur   . Varicose veins   . Pneumonia     "couple times; long time ago" (08/05/2013)  . Exertional shortness of breath   . Type II diabetes mellitus   . Anemia      "as a child" (08/05/2013)  . GERD (gastroesophageal reflux disease)   . H/O hiatal hernia   . Osteoarthritis   . Arthritis     "all over me; in all my joints" (08/05/2013)  . Coccyx pain   . Gout     "right foot" (08/05/2013)  . Anxiety   . Depression     Past Surgical History  Procedure Laterality Date  . Total knee arthroplasty Right 2011  . Tonsillectomy    . Appendectomy    . Abdominal hysterectomy      "partial" (08/05/2013)  . Dilation and curettage of uterus      "several" (08/05/2013)  . Foot neuroma surgery Right   . Insert / replace / remove pacemaker  1998; 2000's; 2014  . Cardiac catheterization      "more than once" (08/05/2013)  . Back surgery      "bone spur removed; mid back" (08/05/2013)     Discharge Condition: stable       Follow-up Information  Follow up with [facility] (for home health nurse, physical therapy and aide)    Contact information:   [address] [place] [address] [phone]       Follow up with Lupe Carney, MD. Schedule an appointment as soon as possible for a visit in 3 days.   Specialty:  Family Medicine   Contact information:   [address] [place] [address] [phone]       Follow up with Olga Millers, MD. Schedule an appointment as soon as possible for a visit in 1 week.   Specialty:  Cardiology   Contact information:   [address] [place] [address] [phone]       Follow up with EARLY, TODD, MD. Schedule an appointment as soon as possible for a visit in 1 week. (Nonspecific CT angiogram findings)    Specialty:  Vascular Surgery   Contact information:   [address] [place] [address] [phone]         Consults obtained - Cardiology   Discharge Medications      Medication List         acetaminophen 500 MG tablet  Commonly known as:  TYLENOL  Take 500 mg by mouth every 6 (six) hours as needed for moderate pain.      BISOPROLOL FUMARATE PO  Take 10 mg by mouth daily.     budesonide-formoterol 160-4.5 MCG/ACT inhaler  Commonly known as:  SYMBICORT  Inhale 2 puffs into the lungs 2 (two) times daily.     buPROPion 300 MG 24 hr tablet  Commonly known as:  WELLBUTRIN XL  Take 300 mg by mouth daily.     cefUROXime 500 MG tablet  Commonly known as:  CEFTIN  Take 1 tablet (500 mg total) by mouth 2 (two) times daily with a meal.     conjugated estrogens vaginal cream  Commonly known as:  PREMARIN  Place 1 Applicatorful vaginally daily as needed.     diltiazem 120 MG tablet  Commonly known as:  CARDIZEM  Take 120 mg by mouth 2 (two) times daily.     famotidine 20 MG tablet  Commonly known as:  PEPCID  Take 20 mg by mouth daily as needed for heartburn or indigestion.     feeding supplement (ENSURE) Pudg  Take 1 Container by mouth daily.     fluticasone 50 MCG/ACT nasal spray  Commonly known as:  FLONASE  Place 2 sprays into both nostrils as needed for rhinitis or allergies.     furosemide 10 MG/ML solution  Commonly known as:  LASIX  Take 20 mg by mouth daily.     gabapentin 100 MG capsule  Commonly known as:  NEURONTIN  Take 100 mg by mouth 3 (three) times daily.     HYDROcodone-acetaminophen 10-325 MG per tablet  Commonly known as:  NORCO  Take 1 tablet by mouth every 6 (six) hours as needed for moderate pain.     LORazepam 0.5 MG tablet  Commonly known as:  ATIVAN  Take 0.5 mg by mouth 3 (three) times daily as needed for anxiety.     losartan 100 MG tablet  Commonly known as:  COZAAR  Take 100 mg by mouth daily.     meclizine 12.5 MG tablet  Commonly known as:  ANTIVERT  Take 1 tablet (12.5 mg total) by mouth 3 (three) times daily as needed for dizziness.     metFORMIN 500 MG tablet  Commonly known as:  GLUCOPHAGE  Take 500 mg by mouth 2 (two) times daily with a meal.     potassium chloride 20 MEQ packet  Commonly known as:  KLOR-CON  Take by mouth 2 (two) times daily.      PROAIR HFA 108 (90 BASE) MCG/ACT inhaler  Generic drug:  albuterol  Inhale 2 puffs into the lungs every 6 (six) hours as needed. For shortness of breath/wheezing     sertraline 100 MG tablet  Commonly known as:  ZOLOFT  Take 200 mg by mouth daily.     simvastatin 20 MG tablet  Commonly known as:  ZOCOR  Take 20 mg by mouth at bedtime.     temazepam 7.5 MG capsule  Commonly known as:  RESTORIL  Take 1 capsule (7.5 mg total) by mouth at bedtime as needed for sleep or anxiety. Insomnia         Diet and Activity recommendation: See Discharge Instructions below   Discharge Instructions     Follow with Primary MD Lupe Carney, MD in 3 days   Get CBC, CMP, checked 3 days by Primary MD and again as instructed by your Primary MD.    Activity: As tolerated with Full fall precautions use walker/cane & assistance as needed   Disposition Home     Diet: Heart healthy low carbohydrate.  For Heart failure patients - Check your Weight same time everyday, if you gain over 2 pounds, or you develop in leg swelling, experience more shortness of breath or chest pain, call your Primary MD immediately. Follow Cardiac Low Salt Diet and 1.8 lit/day fluid restriction.   On your next visit with her primary care physician please Get Medicines reviewed and adjusted.  Please request your Prim.MD to go over all Hospital Tests and Procedure/Radiological results at the follow up, please get all Hospital records sent to your Prim MD by signing hospital release before you go home.   If you experience worsening of your admission symptoms, develop shortness of breath, life threatening emergency, suicidal or homicidal thoughts you must seek medical attention immediately by calling 911 or calling your MD immediately  if symptoms less severe.  You Must read complete instructions/literature along with all the possible adverse reactions/side effects for all the Medicines you take and that have been  prescribed to you. Take any new Medicines after you have completely understood and accpet all the possible adverse reactions/side effects.   Do not drive and provide baby sitting services if your were admitted for syncope or siezures until you have seen by Primary MD or a Neurologist and advised to do so again.  Do not drive when taking Pain medications.    Do not take more than prescribed Pain, Sleep and Anxiety Medications  Special Instructions: If you have smoked or chewed Tobacco  in the last 2 yrs please stop smoking, stop any regular Alcohol  and or any Recreational drug use.  Wear Seat belts while driving.   Please note  You were cared for by a hospitalist during your hospital stay. If you have any questions about your discharge medications or the care you received while you were in the hospital after you are discharged, you can call the unit and asked to speak with the hospitalist on call if the hospitalist that took care of you is not available. Once you are discharged, your primary care physician will handle any further medical issues. Please note that NO REFILLS for any discharge medications will be authorized once  you are discharged, as it is imperative that you return to your primary care physician (or establish a relationship with a primary care physician if you do not have one) for your aftercare needs so that they can reassess your need for medications and monitor your lab values.    Major procedures and Radiology Reports - PLEASE review detailed and final reports for all details, in brief -       Ct Angio Head W/cm &/or Wo Cm  11/19/2013   CLINICAL DATA:  Blurred vision.  Unsteady gait.  EXAM: CT ANGIOGRAPHY HEAD  TECHNIQUE: Multidetector CT imaging of the head was performed using the standard protocol during bolus administration of intravenous contrast. Multiplanar CT image reconstructions including MIPs were obtained to evaluate the vascular anatomy.  CONTRAST:   OMNIPAQUE IOHEXOL 350 MG/ML SOLN  COMPARISON:  Head CT 11/17/2013  FINDINGS: Brain shows generalized atrophy. There chronic appearing small vessel changes throughout the cerebral hemispheric white matter. There is a punctate parenchymal calcification in the right parietal region. No sign of mass lesion, hemorrhage, hydrocephalus or extra-axial collection.  Both internal carotid arteries are patent through the siphon regions. There is some calcification in the carotid siphons but no flow-limiting stenosis the anterior and middle cerebral vessels are patent without visible stenosis, aneurysm or vascular malformation.  Both vertebral arteries are patent. There is some atherosclerotic calcification at the foramen magnum level but no stenosis. Basilar artery shows mild atherosclerotic irregularity but no flow-limiting stenosis. Posterior circulation branch vessels appear patent and normal.  No abnormal contrast enhancement is demonstrated.  Review of the MIP images confirms the above findings.  IMPRESSION: No major vessel occlusion or definable stenosis. Atherosclerotic calcification of the carotid arteries in the carotid siphon regions and of the vertebral arteries at the foramen magnum level.   Electronically Signed   By: Paulina Fusi M.D.   On: 11/19/2013 17:26   Dg Lumbar Spine 2-3 Views  11/17/2013   CLINICAL DATA:  Fall  EXAM: LUMBAR SPINE - 2-3 VIEW  COMPARISON:  02/05/2013  FINDINGS: There is a new L4 compression fracture with at least 60% loss of height anteriorly and centrally. Retropulsion of the superior endplate is suspected. Osteopenia. Severe narrowing of the L5-S1 disc is demonstrated.  IMPRESSION: New L4 compression fracture as described. Retropulsion of the superior endplate is suspected.   Electronically Signed   By: Maryclare Bean M.D.   On: 11/17/2013 21:50   Dg Pelvis 1-2 Views  11/17/2013   CLINICAL DATA:  Fall  EXAM: PELVIS - 1-2 VIEW  COMPARISON:  None.  FINDINGS: L4 compression fracture is noted.  Osteopenia. Otherwise no acute fracture or dislocation.  IMPRESSION: No acute bony injury in the pelvis or femurs.   Electronically Signed   By: Maryclare Bean M.D.   On: 11/17/2013 21:51   Ct Head Wo Contrast  11/17/2013   CLINICAL DATA:  Fall.  Back bruising  EXAM: CT HEAD WITHOUT CONTRAST  CT CERVICAL SPINE WITHOUT CONTRAST  TECHNIQUE: Multidetector CT imaging of the head and cervical spine was performed following the standard protocol without intravenous contrast. Multiplanar CT image reconstructions of the cervical spine were also generated.  COMPARISON:  10/01/2013  FINDINGS: CT HEAD FINDINGS  Skull and Sinuses:Right occipital scalp contusion without underlying fracture.  Orbits: No acute abnormality.  Brain: No evidence of acute abnormality, such as acute infarction, hemorrhage, hydrocephalus, or mass lesion/mass effect. Stable pattern of chronic small vessel ischemic change with patchy bilateral cerebral white matter low density.  CT CERVICAL SPINE FINDINGS  No acute fracture identified. No acute subluxation. There are T1 and T2 superior endplate fractures which were seen previously. Height loss is mild, on the order of 10%. Fracture lucency in the upper T1 body is still readily visible. T2 is superior endplate changes are predominantly sclerotic. No interval worsening height loss. No posterior element fracturing. There is a band of sclerosis in the upper T3 body, reference dedicated thoracic spine imaging.  IMPRESSION: 1. No evidence of acute intracranial or cervical spine injury. 2. Pre-existing T1 and T2 compression fractures, with incomplete healing at the T1 level. No interval worsening of the mild compression.   Electronically Signed   By: Tiburcio Pea M.D.   On: 11/17/2013 21:36   Ct Cervical Spine Wo Contrast  11/17/2013   CLINICAL DATA:  Fall.  Back bruising  EXAM: CT HEAD WITHOUT CONTRAST  CT CERVICAL SPINE WITHOUT CONTRAST  TECHNIQUE: Multidetector CT imaging of the head and cervical spine was  performed following the standard protocol without intravenous contrast. Multiplanar CT image reconstructions of the cervical spine were also generated.  COMPARISON:  10/01/2013  FINDINGS: CT HEAD FINDINGS  Skull and Sinuses:Right occipital scalp contusion without underlying fracture.  Orbits: No acute abnormality.  Brain: No evidence of acute abnormality, such as acute infarction, hemorrhage, hydrocephalus, or mass lesion/mass effect. Stable pattern of chronic small vessel ischemic change with patchy bilateral cerebral white matter low density.  CT CERVICAL SPINE FINDINGS  No acute fracture identified. No acute subluxation. There are T1 and T2 superior endplate fractures which were seen previously. Height loss is mild, on the order of 10%. Fracture lucency in the upper T1 body is still readily visible. T2 is superior endplate changes are predominantly sclerotic. No interval worsening height loss. No posterior element fracturing. There is a band of sclerosis in the upper T3 body, reference dedicated thoracic spine imaging.  IMPRESSION: 1. No evidence of acute intracranial or cervical spine injury. 2. Pre-existing T1 and T2 compression fractures, with incomplete healing at the T1 level. No interval worsening of the mild compression.   Electronically Signed   By: Tiburcio Pea M.D.   On: 11/17/2013 21:36   Ct Thoracic Spine Wo Contrast  11/17/2013   CLINICAL DATA:  Follow  EXAM: CT THORACIC SPINE WITHOUT CONTRAST  TECHNIQUE: Multidetector CT imaging of the thoracic spine was performed without intravenous contrast administration. Multiplanar CT image reconstructions were also generated.  COMPARISON:  10/12/2013  FINDINGS: Stable T1, T2, T3, and T8 compression fractures with sclerotic changes. Stable chronic superior depression and T5. No new compression fracture. Sclerotic changes in the inferior manubrium likely represent a prior fracture with evidence of healing.  Chronic changes in the lung parenchyma are stable.  Stable appearance of the heart with marked enlargement of the left atrium and left ventricle.  IMPRESSION: Stable T1, T2, T3, and T8 compression fractures with sclerotic changes.  There is sclerosis within the manubrium suggesting prior injury with evidence healing. It is a nonspecific finding.   Electronically Signed   By: Maryclare Bean M.D.   On: 11/17/2013 21:27   Ct Lumbar Spine Wo Contrast  11/18/2013   CLINICAL DATA:  Fall.  EXAM: CT LUMBAR SPINE WITHOUT CONTRAST  TECHNIQUE: Multidetector CT imaging of the lumbar spine was performed without intravenous contrast administration. Multiplanar CT image reconstructions were also generated.  COMPARISON:  Lumbar spine radiography 04/18/2011.  FINDINGS: L4 vertebral body fracture with comminution. Height loss is up to 60% centrally. There is bony retropulsion  which causes AP canal narrowing to 8 mm. No subluxation or posterior element fracturing. The body appears sclerotic, but the fracture margins are still clearly visible.  Diffuse degenerative disc and facet disease, with disc narrowing most notable at L5-S1 were there is disc bulging and endplate spurring narrows the left foramen. There has been previous left-sided L4-5 laminotomy.  Osteopenia.  Abdominal aortic atherosclerosis. Minimally imaged exophytic lesion from the right kidney, also seen in 2008.  IMPRESSION: Comminuted L4 body fracture which is unhealed, but likely non-acute. Height loss is up to 60%; moderate bony retropulsion and spinal canal stenosis.   Electronically Signed   By: Tiburcio Pea M.D.   On: 11/18/2013 01:29    Micro Results     Recent Results (from the past 240 hour(s))  URINE CULTURE     Status: None   Collection Time    11/17/13 10:04 PM      Result Value Range Status   Specimen Description URINE, CLEAN CATCH   Final   Special Requests NONE   Final   Culture  Setup Time     Final   Value: 11/18/2013 04:05     Performed at [facility] Count     Final    Value: >=100,000 COLONIES/ML     Performed at [facility]   Culture     Final   Value: ESCHERICHIA COLI     Performed at [facility]   Report Status 11/19/2013 FINAL   Final   Organism ID, Bacteria ESCHERICHIA COLI   Final     History of present illness and  Hospital Course:     Kindly see H&P for history of present illness and admission details, please review complete Labs, Consult reports and Test reports for all details in brief [name] [name], is a 78 y.o. female, patient with history of  Vertigo relieved by Meclizine in the past, on triple falls in the past, atrial fibrillation not on anticoagulation due to falls, COPD, pulmonary hypertension, L4 compression fracture in the past. Was admitted to the hospital mainly for another fall and back pain.   Fall was related to vertigo rather than syncope, after detailed history it looks like patient's symptoms are consistent with vertigo she says she gets a spinning sensation even when she is laying flat in the past this has been relieved by meclizine. She also has chronic restrictive diastolic CHF, pulmonary hypertension which is severe along with atrial fibrillation, 2-D echogram was done here which shows a preserved EF, continued evidence of severe pulmonary hypertension, she was seen by cardiology and cleared for home discharge from cardiology standpoint.    She was offered rehabilitation placement but she declined she wishes to go home, will order home PT OT and social work along with a home walker.   She also underwent a head CT angiogram which shows nonspecific atherosclerosis of her blood vessels however no major plaque, she never had any focal deficits and has none today. After detailed history it is clear that her symptoms are more consistent with vertigo rather than anything else. I will place her on meclizine along with home dose aspirin for secondary prevention of atherosclerotic disease. We'll request PCP to have her  follow with vascular surgery one time for her nonspecific CT angiogram findings.     History of chronic diastolic CHF and atrial fibrillation she will continue her comminution of Cardizem, beta blocker and aspirin. Poor Coumadin candidate due to multiple falls. We'll  follow with cardiology post discharge.    COPD with pulmonary hypertension acute issues outpatient followup with PCP.     Also had UTI for which she will get a few more days of Ceftin.     She had back pain with evidence of chronic L4 compression fracture, back pain is pretty much resolved now, she claimed that this was acute on chronic back pain upon this admission. Will follow with PCP for this problem post discharge.    Diabetes mellitus type 2 no acute issues resume home medications unchanged and follow with PCP for glycemic control    Severe protein calorie malnutrition from this admission. Seen by dietitian placed on protein supplementation.       Today   Subjective:   Signe ColtMae Taher today has no headache,no chest abdominal pain,no new weakness tingling or numbness, feels much better wants to go home today.    Objective:   Blood pressure 158/68, pulse 78, temperature 97.3 F (36.3 C), temperature source Axillary, resp. rate 16, height 5\' 4"  (1.626 m), weight 57.5 kg (126 lb 12.2 oz), SpO2 94.00%.   Intake/Output Summary (Last 24 hours) at 11/20/13 1130 Last data filed at 11/20/13 0500  Gross per 24 hour  Intake    535 ml  Output    625 ml  Net    -90 ml    Exam Awake Alert, Oriented *3, No new F.N deficits, Normal affect [place].AT,PERRAL Supple Neck,No JVD, No cervical lymphadenopathy appriciated.  Symmetrical Chest wall movement, Good air movement bilaterally, CTAB RRR,No Gallops,Rubs or new Murmurs, No Parasternal Heave +ve B.Sounds, Abd Soft, Non tender, No organomegaly appriciated, No rebound -guarding or rigidity. No Cyanosis, Clubbing or edema, No new Rash or bruise  Data Review   CBC w  Diff: Lab Results  Component Value Date   WBC 8.3 11/17/2013   HGB 15.0 11/17/2013   HCT 44.0 11/17/2013   PLT 254 11/17/2013   LYMPHOPCT 14 10/01/2013   MONOPCT 9 10/01/2013   EOSPCT 2 10/01/2013   BASOPCT 1 10/01/2013    CMP: Lab Results  Component Value Date   NA 143 11/17/2013   K 3.9 11/17/2013   CL 99 11/17/2013   CO2 27 11/17/2013   BUN 15 11/17/2013   CREATININE 0.73 11/17/2013   PROT 7.4 08/05/2013   ALBUMIN 4.3 08/05/2013   BILITOT 0.8 08/05/2013   ALKPHOS 64 08/05/2013   AST 45* 08/05/2013   ALT 13 08/05/2013  .   Total Time in preparing paper work, data evaluation and todays exam - 35 minutes  Leroy SeaSINGH,PRASHANT K M.D on 11/20/2013 at 11:30 AM  [facility] Office  [phone]

## 2013-11-20 NOTE — Progress Notes (Signed)
   CARE MANAGEMENT NOTE 11/20/2013  Patient:  [name] [name],[name] [name]   Account Number:  [id number]  Date Initiated:  11/20/2013  Documentation initiated by:  [facility]FF[facility],Kymari Lollis  Subjective/Objective Assessment:   adm: Fall     Action/Plan:   discharge planning   Anticipated DC Date:  11/20/2013   Anticipated DC Plan:  HOME W HOME HEALTH SERVICES      DC Planning Services  CM consult      Choice offered to / List presented to:  C-1 Patient        HH arranged  HH-1 RN  HH-2 PT  HH-4 NURSE'S AIDE      [facility]   Status of service:  Completed, signed off Medicare Important Message given?   (If response is "NO", the following Medicare IM given date fields will be blank) Date Medicare IM given:   Date Additional Medicare IM given:    Discharge Disposition:    Per UR Regulation:    If discussed at Long Length of Stay Meetings, dates discussed:    Comments:  11/20/13 11:00 CM spoke with pt in room to offer choice.  Pt states she has had [facility] in the past and wishes to to have [facility]RN[facility]  PT will be going to her home and states her sister, [name] [name], will be staying with her. Pt states she has a walker and commode and does not need any other equipment.   Pt states she has 2 daughters but both are out of town.  Address and contact numbers were verified.  [facility]  No other CM needs were communicated.  [name], BSN, CM [phone].

## 2013-11-20 NOTE — Discharge Instructions (Signed)
Follow with Primary MD Lupe CarneyMITCHELL,DEAN, MD in 3 days   Get CBC, CMP, checked 3 days by Primary MD and again as instructed by your Primary MD.    Activity: As tolerated with Full fall precautions use walker/cane & assistance as needed   Disposition Home     Diet: Heart healthy low carbohydrate.  For Heart failure patients - Check your Weight same time everyday, if you gain over 2 pounds, or you develop in leg swelling, experience more shortness of breath or chest pain, call your Primary MD immediately. Follow Cardiac Low Salt Diet and 1.8 lit/day fluid restriction.   On your next visit with her primary care physician please Get Medicines reviewed and adjusted.  Please request your Prim.MD to go over all Hospital Tests and Procedure/Radiological results at the follow up, please get all Hospital records sent to your Prim MD by signing hospital release before you go home.   If you experience worsening of your admission symptoms, develop shortness of breath, life threatening emergency, suicidal or homicidal thoughts you must seek medical attention immediately by calling 911 or calling your MD immediately  if symptoms less severe.  You Must read complete instructions/literature along with all the possible adverse reactions/side effects for all the Medicines you take and that have been prescribed to you. Take any new Medicines after you have completely understood and accpet all the possible adverse reactions/side effects.   Do not drive and provide baby sitting services if your were admitted for syncope or siezures until you have seen by Primary MD or a Neurologist and advised to do so again.  Do not drive when taking Pain medications.    Do not take more than prescribed Pain, Sleep and Anxiety Medications  Special Instructions: If you have smoked or chewed Tobacco  in the last 2 yrs please stop smoking, stop any regular Alcohol  and or any Recreational drug use.  Wear Seat belts while  driving.   Please note  You were cared for by a hospitalist during your hospital stay. If you have any questions about your discharge medications or the care you received while you were in the hospital after you are discharged, you can call the unit and asked to speak with the hospitalist on call if the hospitalist that took care of you is not available. Once you are discharged, your primary care physician will handle any further medical issues. Please note that NO REFILLS for any discharge medications will be authorized once you are discharged, as it is imperative that you return to your primary care physician (or establish a relationship with a primary care physician if you do not have one) for your aftercare needs so that they can reassess your need for medications and monitor your lab values.

## 2013-11-23 NOTE — Progress Notes (Signed)
Date of Visit, 10/01/2013   Patient ID: [name] [name], female   DOB: 04/05/1936, 78 y.o.   MRN: [id number]  Allergies  Allergen Reactions  . Aspirin     Upsets stomach really bad  . Calan [Verapamil Hcl] Other (See Comments)    Patient states it makes her "out of her mind"; bp bottoms out  . Crestor [Rosuvastatin Calcium] Other (See Comments)    Muscle pain  . Escitalopram Oxalate Other (See Comments)    Unknown   . Lipitor [Atorvastatin Calcium] Other (See Comments)    myalgia  . Lopressor [Metoprolol Tartrate] Other (See Comments)    Blood pressure bottoms out   . Nitroglycerin   . Norvasc [Amlodipine Besylate] Other (See Comments)    fatigue  . Nsaids Nausea Only  . Oxycodone   . Prednisone     SWELLING  . Sulfa Antibiotics Nausea Only and Other (See Comments)    Makes her stomach hurt  . Vimovo [Naproxen-Esomeprazole] Nausea Only  . Penicillins Hives and Rash   Chief Complaint  Patient presents with  . Acute Visit    Past Medical History  Diagnosis Date  . Hypertrophic cardiomyopathy   . Hypertension   . Situational mixed anxiety and depressive disorder   . Hypercholesterolemia   . Falls frequently     coumadin stopped  . Pulmonary HTN     has had prior right heart cath in 2010; was felt that most likely due to elevated left sided pressures and would not benefit from vasodilator therapy  . Oxygen dependent     "3L 24/7" (08/05/2013)  . COPD (chronic obstructive pulmonary disease)   . TIA (transient ischemic attack)   . Atrial fibrillation     Chronic; No longer on coumadin  . CHF (congestive heart failure)   . Pacemaker   . Family history of anesthesia complication     "they have to breath for mom for awhile after anesthesia" (08/05/2013)  . Heart murmur   . Varicose veins   . Pneumonia     "couple times; long time ago" (08/05/2013)  . Exertional shortness of breath   . Type II diabetes mellitus   . Anemia     "as a child" (08/05/2013)  . GERD  (gastroesophageal reflux disease)   . H/O hiatal hernia   . Osteoarthritis   . Arthritis     "all over me; in all my joints" (08/05/2013)  . Coccyx pain   . Gout     "right foot" (08/05/2013)  . Anxiety   . Depression    Past Surgical History  Procedure Laterality Date  . Total knee arthroplasty Right 2011  . Tonsillectomy    . Appendectomy    . Abdominal hysterectomy      "partial" (08/05/2013)  . Dilation and curettage of uterus      "several" (08/05/2013)  . Foot neuroma surgery Right   . Insert / replace / remove pacemaker  1998; 2000's; 2014  . Cardiac catheterization      "more than once" (08/05/2013)  . Back surgery      "bone spur removed; mid back" (08/05/2013)   Current Outpatient Prescriptions on File Prior to Visit  Medication Sig Dispense Refill  . buPROPion (WELLBUTRIN XL) 300 MG 24 hr tablet Take 300 mg by mouth daily.      . fluticasone (FLONASE) 50 MCG/ACT nasal spray Place 2 sprays into both nostrils as needed for rhinitis or allergies.      .[name]  PROAIR HFA 108 (90 BASE) MCG/ACT inhaler Inhale 2 puffs into the lungs every 6 (six) hours as needed. For shortness of breath/wheezing      . sertraline (ZOLOFT) 100 MG tablet Take 200 mg by mouth daily.       . temazepam (RESTORIL) 7.5 MG capsule Take 1 capsule (7.5 mg total) by mouth at bedtime as needed for sleep or anxiety. Insomnia  20 capsule  0   No current facility-administered medications on file prior to visit.   Past Surgical History  Procedure Laterality Date  . Total knee arthroplasty Right 2011  . Tonsillectomy    . Appendectomy    . Abdominal hysterectomy      "partial" (08/05/2013)  . Dilation and curettage of uterus      "several" (08/05/2013)  . Foot neuroma surgery Right   . Insert / replace / remove pacemaker  1998; 2000's; 2014  . Cardiac catheterization      "more than once" (08/05/2013)  . Back surgery      "bone spur removed; mid back" (08/05/2013)    Pt fell while standing up, and moving to  wheelchair.  Has sustained a large laceration on he L lateral forehead.  Review of systems: Hematoma, left lateral forehead status post fall this afternoon No LOC   Vital signs not taken   Upon examination, noted to have a large laceration, approx 5-7 cm long, gaping open.   Noted to have depth, amount undetermined Immediate formation of hematoma adjacent to laceration  Pt remains alert and oriented, in no acute distress.  Assessment/Plan:  Laceration, not approximated, and immediate adjacent hematoma formation Will send to ER for eval and likely suturing.

## 2013-11-29 ENCOUNTER — Encounter: Payer: Self-pay | Admitting: Nurse Practitioner

## 2013-11-29 ENCOUNTER — Ambulatory Visit (INDEPENDENT_AMBULATORY_CARE_PROVIDER_SITE_OTHER): Payer: Medicare Other | Admitting: Nurse Practitioner

## 2013-11-29 VITALS — BP 138/72 | HR 97 | Ht 64.0 in | Wt 117.0 lb

## 2013-11-29 DIAGNOSIS — J4489 Other specified chronic obstructive pulmonary disease: Secondary | ICD-10-CM

## 2013-11-29 DIAGNOSIS — R079 Chest pain, unspecified: Secondary | ICD-10-CM

## 2013-11-29 DIAGNOSIS — I10 Essential (primary) hypertension: Secondary | ICD-10-CM

## 2013-11-29 DIAGNOSIS — J449 Chronic obstructive pulmonary disease, unspecified: Secondary | ICD-10-CM

## 2013-11-29 DIAGNOSIS — R55 Syncope and collapse: Secondary | ICD-10-CM

## 2013-11-29 DIAGNOSIS — I4891 Unspecified atrial fibrillation: Secondary | ICD-10-CM

## 2013-11-29 NOTE — Patient Instructions (Addendum)
Your physician has requested that you have a lexiscan myoview. Please follow instruction sheet, as given.  Your physician recommends that you continue on your current medications as directed. Please refer to the Current Medication list given to you today.  KEEP YOUR APPOINTMENT WITH DR. Patty SermonsBRACKBILL

## 2013-11-29 NOTE — Progress Notes (Signed)
Patient Name: [name] [name] Date of Encounter: 11/29/2013  Primary Care Provider:  Lupe CarneyMITCHELL,DEAN, MD Primary Cardiologist:  Wylene Simmer. Brackbill, MD   Patient Profile  78 year old female who was recently admitted secondary to syncope and fall who presents for followup.  Problem List   Past Medical History  Diagnosis Date  . Hypertrophic cardiomyopathy     a. 11/2013 Echo: EF 55-60%, basal inf HK, sev dil LA, no evidence of HCM.  [name]. Hypertension   . Situational mixed anxiety and depressive disorder   . Hypercholesterolemia   . Falls frequently     coumadin stopped  . Pulmonary HTN     has had prior right heart cath in 2010; was felt that most likely due to elevated left sided pressures and would not benefit from vasodilator therapy  . Oxygen dependent     "3L 24/7" (08/05/2013)  . COPD (chronic obstructive pulmonary disease)   . TIA (transient ischemic attack)   . Atrial fibrillation     a. Chronic; No longer on coumadin 2/2 frequent falls.  . CHF (congestive heart failure)   . Pacemaker     a. 04/2012 MDT [id number] [facility], ser #: [id number] H.  . Varicose veins   . History of pneumonia     "couple times; long time ago" (08/05/2013)  . Type II diabetes mellitus   . Anemia     "as a child" (08/05/2013)  . GERD (gastroesophageal reflux disease)   . H/O hiatal hernia   . Osteoarthritis   . Arthritis     "all over me; in all my joints" (08/05/2013)  . Coccyx pain   . Gout     "right foot" (08/05/2013)  . Anxiety   . Depression   . Chest pain     a. 08/2011 Myoview: sm, fixed anteroseptal defect (attenuation), no ischemia, EF 62%.   Past Surgical History  Procedure Laterality Date  . Total knee arthroplasty Right 2011  . Tonsillectomy    . Appendectomy    . Abdominal hysterectomy      "partial" (08/05/2013)  . Dilation and curettage of uterus      "several" (08/05/2013)  . Foot neuroma surgery Right   . Insert / replace / remove pacemaker  1998; 2000's; 2014  . Cardiac  catheterization      "more than once" (08/05/2013)  . Back surgery      "bone spur removed; mid back" (08/05/2013)    Allergies  Allergies  Allergen Reactions  . Aspirin     Upsets stomach really bad  . Calan [Verapamil Hcl] Other (See Comments)    Patient states it makes her "out of her mind"; bp bottoms out  . Crestor [Rosuvastatin Calcium] Other (See Comments)    Muscle pain  . Escitalopram Oxalate Other (See Comments)    Unknown   . Lipitor [Atorvastatin Calcium] Other (See Comments)    myalgia  . Lopressor [Metoprolol Tartrate] Other (See Comments)    Blood pressure bottoms out   . Nitroglycerin   . Norvasc [Amlodipine Besylate] Other (See Comments)    fatigue  . Nsaids Nausea Only  . Oxycodone   . Prednisone     SWELLING  . Sulfa Antibiotics Nausea Only and Other (See Comments)    Makes her stomach hurt  . Vimovo [Naproxen-Esomeprazole] Nausea Only  . Penicillins Hives and Rash    HPI  78 year old female with a prior history of permanent atrial fibrillation as well as permanent pacemaker placement.  She has a  fairly long history at this point of falls and unsteady gait with intermittent syncope as well.  She is no longer on oral anticoagulation secondary to falls.  She was recently admitted to [facility] following a fall which she described as being sudden in nature.  She did not remember how she got to the floor.  She did suffer back pain and in the hospital was found to have a chronic thoracic and lumbar compression fractures.  Cardiology consult as well she was hospitalized in telemetry showed rate controlled atrial fibrillation without evidence of tachyarrhythmias.  She was paced on demand.  Her pacemaker was interrogated while she was in the emergency room and this showed normal device function.  Echo showed nl LV fxn with a PASP of .  She was cleared for discharge by cardiology and was offered rehabilitation but apparently refused.  She is now at home and  ambulating with the use of a walker.  She continues to be very unsteady on her feet and continues to fall often.  She says that she does not lose consciousness but then also says that she does not always remember how she got to the floor.  She has not sustained a severe injury since discharge.  She experiences intermittent exertional chest discomfort, occurring about once a week.  This is most likely to occur following more prolonged or heavier exertion and resolves within a few minutes with rest.  She says she has had discomfort in this pattern for several years.  Her last stress test was in 2012.  She has chronic dyspnea on exertion and wears 3 L of oxygen around-the-clock at this point.  She denies PND, orthopnea, syncope, edema, or early satiety.  Home Medications  Prior to Admission medications   Medication Sig Start Date End Date Taking? Authorizing Provider  acetaminophen (TYLENOL) 500 MG tablet Take 500 mg by mouth every 6 (six) hours as needed for moderate pain.    Historical Provider, MD  BISOPROLOL FUMARATE PO Take 10 mg by mouth daily.    Historical Provider, MD  budesonide-formoterol (SYMBICORT) 160-4.5 MCG/ACT inhaler Inhale 2 puffs into the lungs 2 (two) times daily.    Historical Provider, MD  buPROPion (WELLBUTRIN XL) 300 MG 24 hr tablet Take 300 mg by mouth daily.    Historical Provider, MD  cefUROXime (CEFTIN) 500 MG tablet Take 1 tablet (500 mg total) by mouth 2 (two) times daily with a meal. 11/20/13   Leroy Sea, MD  conjugated estrogens (PREMARIN) vaginal cream Place 1 Applicatorful vaginally daily as needed.    Historical Provider, MD  diltiazem (CARDIZEM) 120 MG tablet Take 120 mg by mouth 2 (two) times daily.    Historical Provider, MD  famotidine (PEPCID) 20 MG tablet Take 20 mg by mouth daily as needed for heartburn or indigestion.    Historical Provider, MD  feeding supplement, ENSURE, (ENSURE) PUDG Take 1 Container by mouth daily. 11/20/13   Leroy Sea, MD    fluticasone (FLONASE) 50 MCG/ACT nasal spray Place 2 sprays into both nostrils as needed for rhinitis or allergies.    Historical Provider, MD  furosemide (LASIX) 10 MG/ML solution Take 20 mg by mouth daily.    Historical Provider, MD  gabapentin (NEURONTIN) 100 MG capsule Take 100 mg by mouth 3 (three) times daily.    Historical Provider, MD  HYDROcodone-acetaminophen (NORCO) 10-325 MG per tablet Take 1 tablet by mouth every 6 (six) hours as needed for moderate pain.    Historical Provider,  MD  LORazepam (ATIVAN) 0.5 MG tablet Take 0.5 mg by mouth 3 (three) times daily as needed for anxiety.    Historical Provider, MD  losartan (COZAAR) 100 MG tablet Take 100 mg by mouth daily.    Historical Provider, MD  meclizine (ANTIVERT) 12.5 MG tablet Take 1 tablet (12.5 mg total) by mouth 3 (three) times daily as needed for dizziness. 11/20/13   Leroy Sea, MD  metFORMIN (GLUCOPHAGE) 500 MG tablet Take 500 mg by mouth 2 (two) times daily with a meal.    Historical Provider, MD  potassium chloride (KLOR-CON) 20 MEQ packet Take by mouth 2 (two) times daily.    Historical Provider, MD  PROAIR HFA 108 (90 BASE) MCG/ACT inhaler Inhale 2 puffs into the lungs every 6 (six) hours as needed. For shortness of breath/wheezing 02/11/12   Historical Provider, MD  sertraline (ZOLOFT) 100 MG tablet Take 200 mg by mouth daily.     Historical Provider, MD  simvastatin (ZOCOR) 20 MG tablet Take 20 mg by mouth at bedtime.     Historical Provider, MD  temazepam (RESTORIL) 7.5 MG capsule Take 1 capsule (7.5 mg total) by mouth at bedtime as needed for sleep or anxiety. Insomnia 08/10/13   Ripudeep Jenna Luo, MD    Review of Systems  Intermittent exertional chest discomfort, chronic DOE, and chronic unsteady gait as above.  She has intermittent constipation and low back pain.  All other systems reviewed and are otherwise negative except as noted above.  Physical Exam  Blood pressure 138/72, pulse 97, height 5\' 4"  (1.626 m),  weight 117 lb (53.071 kg).  General: Pleasant, NAD Psych: Flat affect. Neuro: Alert and oriented X 3. Moves all extremities spontaneously. HEENT: Normal  Neck: Supple without bruits or JVD. Lungs:  Resp regular and unlabored, Crackles @ right base otw CTA Heart: IR, IR,  no s3, s4.  2/6 syst murmur noted throughout. Abdomen: Soft, non-tender, non-distended, BS + x 4.  Extremities: No clubbing, cyanosis or edema. DP/PT/Radials 2+ and equal bilaterally.  Accessory Clinical Findings  ECG - afib, 97, pvc, inflat st dep - unchanged from old ecg.  Assessment & Plan  1.  Syncope and falls: Patient has a long history of unsteady gait and falls.  She was recently admitted following a fall and back pain and did not remember the accident falling.  Per discharge summary, there was some vertiginous-like symptoms preceding the fall and she has been prescribed meclizine.  Echocardiography showed normal LV function while telemetry failed to show any tachyarrhythmias, and pacemaker interrogation showed normal device function without evidence of significant high ventricular rates.  She was not found to be orthostatic.  She continues to have an unsteady gait and has continued to fall.  She refused rehabilitation following her last admission.  2.  Chest pain: Patient reports intermittent substernal chest discomfort associated with dyspnea occurring after high levels of exertion or prolonged exertion.  This is been going on for one to 2 years on a weekly basis and so she can limit her symptoms by limiting her activity.  Her last stress test was in 2012.  We will arrange for a lexiscan cardiolite to r/o ischemia.  3.  Afib:  Reasonably rate controlled.  Cont bb/dilt/ASA the.  No oral anticoagulation 2/2 #1.  4.  HTN:  Stable.  5.  COPD:  On home O2. Followed by pulmonology.  No active wheezing.  6.  Dispo:  Will obtain a lexiscan CL 2/2 exertional chest pain.  F/u with Dr. Patty Sermons as scheduled in  Feb.   Nicolasa Ducking, NP 11/29/2013, 3:11 PM

## 2013-11-30 ENCOUNTER — Emergency Department (HOSPITAL_COMMUNITY): Payer: Medicare Other

## 2013-11-30 ENCOUNTER — Emergency Department (HOSPITAL_COMMUNITY)
Admission: EM | Admit: 2013-11-30 | Discharge: 2013-11-30 | Disposition: A | Discharge status: Home / Self Care | Payer: Medicare Other | Attending: Emergency Medicine | Admitting: Emergency Medicine

## 2013-11-30 DIAGNOSIS — Z95818 Presence of other cardiac implants and grafts: Secondary | ICD-10-CM | POA: Insufficient documentation

## 2013-11-30 DIAGNOSIS — K219 Gastro-esophageal reflux disease without esophagitis: Secondary | ICD-10-CM | POA: Insufficient documentation

## 2013-11-30 DIAGNOSIS — S20219A Contusion of unspecified front wall of thorax, initial encounter: Secondary | ICD-10-CM | POA: Insufficient documentation

## 2013-11-30 DIAGNOSIS — Z79899 Other long term (current) drug therapy: Secondary | ICD-10-CM | POA: Insufficient documentation

## 2013-11-30 DIAGNOSIS — Z880 Allergy status to penicillin: Secondary | ICD-10-CM | POA: Insufficient documentation

## 2013-11-30 DIAGNOSIS — E7800 Pure hypercholesterolemia, unspecified: Secondary | ICD-10-CM | POA: Insufficient documentation

## 2013-11-30 DIAGNOSIS — Z9981 Dependence on supplemental oxygen: Secondary | ICD-10-CM | POA: Insufficient documentation

## 2013-11-30 DIAGNOSIS — M1990 Unspecified osteoarthritis, unspecified site: Secondary | ICD-10-CM | POA: Insufficient documentation

## 2013-11-30 DIAGNOSIS — F411 Generalized anxiety disorder: Secondary | ICD-10-CM | POA: Insufficient documentation

## 2013-11-30 DIAGNOSIS — I4891 Unspecified atrial fibrillation: Secondary | ICD-10-CM | POA: Insufficient documentation

## 2013-11-30 DIAGNOSIS — E119 Type 2 diabetes mellitus without complications: Secondary | ICD-10-CM | POA: Insufficient documentation

## 2013-11-30 DIAGNOSIS — IMO0002 Reserved for concepts with insufficient information to code with codable children: Secondary | ICD-10-CM | POA: Insufficient documentation

## 2013-11-30 DIAGNOSIS — F3289 Other specified depressive episodes: Secondary | ICD-10-CM | POA: Insufficient documentation

## 2013-11-30 DIAGNOSIS — Z8673 Personal history of transient ischemic attack (TIA), and cerebral infarction without residual deficits: Secondary | ICD-10-CM | POA: Insufficient documentation

## 2013-11-30 DIAGNOSIS — Z950 Presence of cardiac pacemaker: Secondary | ICD-10-CM | POA: Insufficient documentation

## 2013-11-30 DIAGNOSIS — W19XXXA Unspecified fall, initial encounter: Secondary | ICD-10-CM

## 2013-11-30 DIAGNOSIS — R42 Dizziness and giddiness: Secondary | ICD-10-CM | POA: Insufficient documentation

## 2013-11-30 DIAGNOSIS — Z87891 Personal history of nicotine dependence: Secondary | ICD-10-CM | POA: Insufficient documentation

## 2013-11-30 DIAGNOSIS — Y939 Activity, unspecified: Secondary | ICD-10-CM | POA: Insufficient documentation

## 2013-11-30 DIAGNOSIS — J449 Chronic obstructive pulmonary disease, unspecified: Secondary | ICD-10-CM | POA: Insufficient documentation

## 2013-11-30 DIAGNOSIS — Z862 Personal history of diseases of the blood and blood-forming organs and certain disorders involving the immune mechanism: Secondary | ICD-10-CM | POA: Insufficient documentation

## 2013-11-30 DIAGNOSIS — Z8701 Personal history of pneumonia (recurrent): Secondary | ICD-10-CM | POA: Insufficient documentation

## 2013-11-30 DIAGNOSIS — J4489 Other specified chronic obstructive pulmonary disease: Secondary | ICD-10-CM | POA: Insufficient documentation

## 2013-11-30 DIAGNOSIS — F329 Major depressive disorder, single episode, unspecified: Secondary | ICD-10-CM | POA: Insufficient documentation

## 2013-11-30 DIAGNOSIS — I10 Essential (primary) hypertension: Secondary | ICD-10-CM | POA: Insufficient documentation

## 2013-11-30 DIAGNOSIS — R296 Repeated falls: Secondary | ICD-10-CM | POA: Insufficient documentation

## 2013-11-30 DIAGNOSIS — Y929 Unspecified place or not applicable: Secondary | ICD-10-CM | POA: Insufficient documentation

## 2013-11-30 DIAGNOSIS — I509 Heart failure, unspecified: Secondary | ICD-10-CM | POA: Insufficient documentation

## 2013-11-30 LAB — URINALYSIS, ROUTINE W REFLEX MICROSCOPIC
Bilirubin Urine: NEGATIVE
GLUCOSE, UA: 100 mg/dL — AB
Hgb urine dipstick: NEGATIVE
Ketones, ur: NEGATIVE mg/dL
LEUKOCYTES UA: NEGATIVE
NITRITE: NEGATIVE
PROTEIN: NEGATIVE mg/dL
Specific Gravity, Urine: 1.021 (ref 1.005–1.030)
Urobilinogen, UA: 0.2 mg/dL (ref 0.0–1.0)
pH: 6 (ref 5.0–8.0)

## 2013-11-30 MED ORDER — HYDROCODONE-ACETAMINOPHEN 5-325 MG PO TABS: 1.0000 | ORAL_TABLET | Freq: Four times a day (QID) | ORAL | Status: DC | PRN

## 2013-11-30 MED ORDER — HYDROCODONE-ACETAMINOPHEN 5-325 MG PO TABS
2.0000 | ORAL_TABLET | Freq: Once | ORAL | Status: AC
  Administered 2013-11-30: 1 via ORAL
  Filled 2013-11-30: qty 2

## 2013-11-30 NOTE — ED Notes (Signed)
Bed: WA04 Expected date:  Expected time:  Means of arrival:  Comments: EMS-fall 

## 2013-11-30 NOTE — ED Notes (Signed)
Pt has been falling frequently. Fell last night and today and is now having pain on both sides of her ribs. Hit night side table on L side last night and hit R side of her ribs in shower today. No obvious deformity. No blood thinners. No LOC. Alert and oriented. Has home health.

## 2013-11-30 NOTE — Discharge Instructions (Signed)
Fall Prevention and Home Safety Falls cause injuries and can affect all age groups. It is possible to use preventive measures to significantly decrease the likelihood of falls. There are many simple measures which can make your home safer and prevent falls. OUTDOORS  Repair cracks and edges of walkways and driveways.  Remove high doorway thresholds.  Trim shrubbery on the main path into your home.  Have good outside lighting.  Clear walkways of tools, rocks, debris, and clutter.  Check that handrails are not broken and are securely fastened. Both sides of steps should have handrails.  Have leaves, snow, and ice cleared regularly.  Use sand or salt on walkways during winter months.  In the garage, clean up grease or oil spills. BATHROOM  Install night lights.  Install grab bars by the toilet and in the tub and shower.  Use non-skid mats or decals in the tub or shower.  Place a plastic non-slip stool in the shower to sit on, if needed.  Keep floors dry and clean up all water on the floor immediately.  Remove soap buildup in the tub or shower on a regular basis.  Secure bath mats with non-slip, double-sided rug tape.  Remove throw rugs and tripping hazards from the floors. BEDROOMS  Install night lights.  Make sure a bedside light is easy to reach.  Do not use oversized bedding.  Keep a telephone by your bedside.  Have a firm chair with side arms to use for getting dressed.  Remove throw rugs and tripping hazards from the floor. KITCHEN  Keep handles on pots and pans turned toward the center of the stove. Use back burners when possible.  Clean up spills quickly and allow time for drying.  Avoid walking on wet floors.  Avoid hot utensils and knives.  Position shelves so they are not too high or low.  Place commonly used objects within easy reach.  If necessary, use a sturdy step stool with a grab bar when reaching.  Keep electrical cables out of the  way.  Do not use floor polish or wax that makes floors slippery. If you must use wax, use non-skid floor wax.  Remove throw rugs and tripping hazards from the floor. STAIRWAYS  Never leave objects on stairs.  Place handrails on both sides of stairways and use them. Fix any loose handrails. Make sure handrails on both sides of the stairways are as long as the stairs.  Check carpeting to make sure it is firmly attached along stairs. Make repairs to worn or loose carpet promptly.  Avoid placing throw rugs at the top or bottom of stairways, or properly secure the rug with carpet tape to prevent slippage. Get rid of throw rugs, if possible.  Have an electrician put in a light switch at the top and bottom of the stairs. OTHER FALL PREVENTION TIPS  Wear low-heel or rubber-soled shoes that are supportive and fit well. Wear closed toe shoes.  When using a stepladder, make sure it is fully opened and both spreaders are firmly locked. Do not climb a closed stepladder.  Add color or contrast paint or tape to grab bars and handrails in your home. Place contrasting color strips on first and last steps.  Learn and use mobility aids as needed. Install an electrical emergency response system.  Turn on lights to avoid dark areas. Replace light bulbs that burn out immediately. Get light switches that glow.  Arrange furniture to create clear pathways. Keep furniture in the same place.  Firmly attach carpet with non-skid or double-sided tape.  Eliminate uneven floor surfaces.  Select a carpet pattern that does not visually hide the edge of steps.  Be aware of all pets. OTHER HOME SAFETY TIPS  Set the water temperature for 120 F (48.8 C).  Keep emergency numbers on or near the telephone.  Keep smoke detectors on every level of the home and near sleeping areas. Document Released: 10/18/2002 Document Revised: 04/28/2012 Document Reviewed: 01/17/2012 [facility] Patient Information 2014  [place], [place]. Chest Contusion A chest contusion is a deep bruise on your chest area. Contusions are the result of an injury that caused bleeding under the skin. A chest contusion may involve bruising of the skin, muscles, or ribs. The contusion may turn blue, purple, or yellow. Minor injuries will give you a painless contusion, but more severe contusions may stay painful and swollen for a few weeks. CAUSES  A contusion is usually caused by a blow, trauma, or direct force to an area of the body. SYMPTOMS   Swelling and redness of the injured area.  Discoloration of the injured area.  Tenderness and soreness of the injured area.  Pain. DIAGNOSIS  The diagnosis can be made by taking a history and performing a physical exam. An X-ray, CT scan, or MRI may be needed to determine if there were any associated injuries, such as broken bones (fractures) or internal injuries. TREATMENT  Often, the best treatment for a chest contusion is resting, icing, and applying cold compresses to the injured area. Deep breathing exercises may be recommended to reduce the risk of pneumonia. Over-the-counter medicines may also be recommended for pain control. HOME CARE INSTRUCTIONS   Put ice on the injured area.  Put ice in a plastic bag.  Place a towel between your skin and the bag.  Leave the ice on for 15-20 minutes, 03-04 times a day.  Only take over-the-counter or prescription medicines as directed by your caregiver. Your caregiver may recommend avoiding anti-inflammatory medicines (aspirin, ibuprofen, and naproxen) for 48 hours because these medicines may increase bruising.  Rest the injured area.  Perform deep-breathing exercises as directed by your caregiver.  Stop smoking if you smoke.  Do not lift objects over 5 pounds (2.3 kg) for 3 days or longer if recommended by your caregiver. SEEK IMMEDIATE MEDICAL CARE IF:   You have increased bruising or swelling.  You have pain that is getting  worse.  You have difficulty breathing.  You have dizziness, weakness, or fainting.  You have blood in your urine or stool.  You cough up or vomit blood.  Your swelling or pain is not relieved with medicines. MAKE SURE YOU:   Understand these instructions.  Will watch your condition.  Will get help right away if you are not doing well or get worse. Document Released: 07/23/2001 Document Revised: 07/22/2012 Document Reviewed: 04/20/2012 [facility] Patient Information 2014 [place], [place].

## 2013-11-30 NOTE — ED Provider Notes (Signed)
CSN: [id number]     Arrival date & time 11/30/13  1743 History   First MD Initiated Contact with Patient 11/30/13 1753     Chief Complaint  Patient presents with  . Fall   (Consider location/radiation/quality/duration/timing/severity/associated sxs/prior Treatment) Patient is a 78 y.o. female presenting with fall and chest pain. The history is provided by the patient and a caregiver. No language interpreter was used.  Fall This is a chronic problem. Episode onset: One last night, one this afternoon. The problem occurs every several days. The problem has not changed since onset.Associated symptoms include chest pain. Pertinent negatives include no abdominal pain, no headaches and no shortness of breath. Nothing aggravates the symptoms. Nothing relieves the symptoms. Treatments tried: meclizine. The treatment provided no relief.  Chest Pain Pain location:  R lateral chest and L lateral chest Pain quality: aching   Pain radiates to:  Does not radiate Pain radiates to the back: no   Pain severity:  Moderate Onset quality:  Sudden Duration: L sided pain after fall onto L side last night, R sided pain after fall onto R side this afternoon. Timing:  Constant Progression:  Unchanged Chronicity:  New Context: trauma   Relieved by:  Nothing Worsened by:  Coughing, deep breathing and movement Ineffective treatments:  None tried Associated symptoms: dizziness   Associated symptoms: no abdominal pain, no back pain, no cough, no diaphoresis, no fatigue, no fever, no headache, no nausea, no near-syncope, no numbness, no palpitations, no shortness of breath, no syncope, not vomiting and no weakness     Past Medical History  Diagnosis Date  . Hypertrophic cardiomyopathy     a. 11/2013 Echo: EF 55-60%, basal inf HK, sev dil LA, no evidence of HCM.  PASP 60mmHg.  [name]. Hypertension   . Situational mixed anxiety and depressive disorder   . Hypercholesterolemia   . Falls frequently     coumadin stopped    . Pulmonary HTN     a. has had prior right heart cath in 2010; was felt that most likely due to elevated left sided pressures and would not benefit from vasodilator therapy;  b. 11/2013 Echo: PASP 60mmHg.  [name]. Oxygen dependent     "3L 24/7" (08/05/2013)  . COPD (chronic obstructive pulmonary disease)   . TIA (transient ischemic attack)   . Atrial fibrillation     a. Chronic; No longer on coumadin 2/2 frequent falls.  . CHF (congestive heart failure)   . Pacemaker     a. 04/2012 MDT [id number] [employer] PPM, ser #: [id number] H.  . Varicose veins   . History of pneumonia     "couple times; long time ago" (08/05/2013)  . Type II diabetes mellitus   . Anemia     "as a child" (08/05/2013)  . GERD (gastroesophageal reflux disease)   . H/O hiatal hernia   . Osteoarthritis   . Arthritis     "all over me; in all my joints" (08/05/2013)  . Coccyx pain   . Gout     "right foot" (08/05/2013)  . Anxiety   . Depression   . Chest pain     a. 08/2011 Myoview: sm, fixed anteroseptal defect (attenuation), no ischemia, EF 62%.   Past Surgical History  Procedure Laterality Date  . Total knee arthroplasty Right 2011  . Tonsillectomy    . Appendectomy    . Abdominal hysterectomy      "partial" (08/05/2013)  . Dilation and curettage of uterus      "  several" (08/05/2013)  . Foot neuroma surgery Right   . Insert / replace / remove pacemaker  1998; 2000's; 2014  . Cardiac catheterization      "more than once" (08/05/2013)  . Back surgery      "bone spur removed; mid back" (08/05/2013)   Family History  Problem Relation Age of Onset  . Other Father     died in plane crash  . Cancer Mother     lymphoma-NHL  . Coronary artery disease Grandchild   . Heart disease Maternal Grandmother    History  Substance Use Topics  . Smoking status: Former Smoker -- 1.00 packs/day for 40 years    Types: Cigarettes    Quit date: 11/15/2005  . Smokeless tobacco: Never Used  . Alcohol Use: No   OB History   Grav  Para Term Preterm Abortions TAB SAB Ect Mult Living                 Review of Systems  Constitutional: Negative for fever, chills, diaphoresis, activity change, appetite change and fatigue.  HENT: Negative for congestion, facial swelling, rhinorrhea and sore throat.   Eyes: Negative for photophobia and discharge.  Respiratory: Negative for cough, chest tightness and shortness of breath.   Cardiovascular: Positive for chest pain. Negative for palpitations, leg swelling, syncope and near-syncope.  Gastrointestinal: Negative for nausea, vomiting, abdominal pain and diarrhea.  Endocrine: Negative for polydipsia and polyuria.  Genitourinary: Negative for dysuria, frequency, difficulty urinating and pelvic pain.  Musculoskeletal: Negative for arthralgias, back pain, neck pain and neck stiffness.  Skin: Negative for color change and wound.  Allergic/Immunologic: Negative for immunocompromised state.  Neurological: Positive for dizziness. Negative for facial asymmetry, weakness, numbness and headaches.  Hematological: Does not bruise/bleed easily.  Psychiatric/Behavioral: Negative for confusion and agitation.    Allergies  Aspirin; Calan; Crestor; Escitalopram oxalate; Lipitor; Lopressor; Nitroglycerin; Norvasc; Nsaids; Oxycodone; Prednisone; Sulfa antibiotics; Vimovo; and Penicillins  Home Medications   Current Outpatient Rx  Name  Route  Sig  Dispense  Refill  . acetaminophen (TYLENOL) 500 MG tablet   Oral   Take 500 mg by mouth every 6 (six) hours as needed for moderate pain.         . bisoprolol (ZEBETA) 10 MG tablet   Oral   Take 10 mg by mouth daily.         . budesonide-formoterol (SYMBICORT) 160-4.5 MCG/ACT inhaler   Inhalation   Inhale 2 puffs into the lungs 2 (two) times daily.         [name] buPROPion (WELLBUTRIN XL) 300 MG 24 hr tablet   Oral   Take 300 mg by mouth daily.         . calcium carbonate (TUMS - DOSED IN MG ELEMENTAL CALCIUM) 500 MG chewable tablet    Oral   Chew 1 tablet by mouth 3 (three) times daily with meals.         [name] diltiazem (CARDIZEM) 120 MG tablet   Oral   Take 120 mg by mouth 2 (two) times daily.         . feeding supplement, ENSURE, (ENSURE) PUDG   Oral   Take 1 Container by mouth daily.   30 Can   0   . furosemide (LASIX) 20 MG tablet   Oral   Take 20 mg by mouth daily.         [name] gabapentin (NEURONTIN) 100 MG capsule   Oral   Take 100 mg by mouth 3 (  three) times daily.         [name] HYDROcodone-acetaminophen (NORCO) 10-325 MG per tablet   Oral   Take 1 tablet by mouth every 6 (six) hours as needed for moderate pain.         [name] losartan (COZAAR) 100 MG tablet   Oral   Take 100 mg by mouth daily.         . meclizine (ANTIVERT) 12.5 MG tablet   Oral   Take 1 tablet (12.5 mg total) by mouth 3 (three) times daily as needed for dizziness.   30 tablet   0   . metFORMIN (GLUCOPHAGE) 500 MG tablet   Oral   Take 500 mg by mouth 2 (two) times daily with a meal.         . potassium chloride (KLOR-CON) 20 MEQ packet   Oral   Take 20 mEq by mouth daily.          . sertraline (ZOLOFT) 100 MG tablet   Oral   Take 200 mg by mouth daily.          . simvastatin (ZOCOR) 20 MG tablet   Oral   Take 20 mg by mouth at bedtime.          . temazepam (RESTORIL) 7.5 MG capsule   Oral   Take 1 capsule (7.5 mg total) by mouth at bedtime as needed for sleep or anxiety. Insomnia   20 capsule   0    BP 157/84  Pulse 97  Temp(Src) 98.4 F (36.9 C) (Oral)  SpO2 98% Physical Exam  Constitutional: She is oriented to person, place, and time. She appears well-developed and well-nourished. No distress.  HENT:  Head: Normocephalic and atraumatic.  Mouth/Throat: No oropharyngeal exudate.  Eyes: Pupils are equal, round, and reactive to light.  Neck: Normal range of motion. Neck supple.  Cardiovascular: Normal rate, regular rhythm and normal heart sounds.  Exam reveals no gallop and no friction rub.   No  murmur heard. Pulmonary/Chest: Effort normal and breath sounds normal. No respiratory distress. She has no wheezes. She has no rales. She exhibits bony tenderness.    Abdominal: Soft. Bowel sounds are normal. She exhibits no distension and no mass. There is no tenderness. There is no rebound and no guarding.  Musculoskeletal: Normal range of motion. She exhibits no edema and no tenderness.  Neurological: She is alert and oriented to person, place, and time.  Skin: Skin is warm and dry.  Psychiatric: She has a normal mood and affect.    ED Course  Procedures (including critical care time) Labs Review Labs Reviewed  URINALYSIS, ROUTINE W REFLEX MICROSCOPIC   Imaging Review No results found.  EKG Interpretation    Date/Time:  Tuesday November 30 2013 18:36:43 EST Ventricular Rate:  91 PR Interval:    QRS Duration: 96 QT Interval:  360 QTC Calculation: 443 R Axis:   15 Text Interpretation:  Atrial fibrillation LVH with secondary repolarization abnormality Anterior Q waves, possibly due to LVH Prior TWI seen in V3, V4 now resolved.  Confirmed by DOCHERTY  MD, MEGAN [phone]) on 11/30/2013 6:47:48 PM            MDM  No diagnosis found. Pt is a 78 y.o. female with Pmhx as above who presents with localized L lateral chest wall pain after fallin ginto corner of table last night and localized R sided chest wall pain after falling into shower this afternoon.  She has longstanding hx of falls.  She denies head injury CP, SOB h/a, numbness, weakness n/v, d/a, dysuria.  Falls appear to be due to vertigo which she has hx of and describes having dizziness upon standing.  She had recent admisison/workup for similar presentation from 1/7-1/10 and falls thought to be due to vertigo. Pt had CT  Head, CTA head, was seen by cardiology prior to d/c. Pacemaker was interrogated prior to d/c.  Pt saw her cardiologist in office yesterday, will have outpt NM stress echo.  Rib XRs ordered and were negative  for acute fx (old seen on L).  As pt just discharged for similar episodes, now has home health and appears to have good out pt f/u, I believe she can be d/c home.  Return precautions given for new or worsening symptoms including head injury, CP, SOB, focal neuro complaints. Pt asked to use walker at all times, change position slowly, use meclizine. Pt's daughter states they have arranged 24 hr care between home health & family.  They will continue to encourage pt to look into ALF.  She currently is receiving PT services. Will d/c home on norco for pain.  Return precautions given for new or worsening symptoms including worsening pain, fever, chills, trouble breathing.           Shanna Cisco, MD 11/30/13 2027

## 2013-12-14 ENCOUNTER — Ambulatory Visit (HOSPITAL_COMMUNITY): Payer: Medicare Other | Attending: Cardiology | Admitting: Radiology

## 2013-12-14 ENCOUNTER — Ambulatory Visit: Payer: Medicare Other | Admitting: Neurology

## 2013-12-14 VITALS — BP 125/76 | Ht 64.0 in | Wt 124.0 lb

## 2013-12-14 DIAGNOSIS — E119 Type 2 diabetes mellitus without complications: Secondary | ICD-10-CM | POA: Insufficient documentation

## 2013-12-14 DIAGNOSIS — I4891 Unspecified atrial fibrillation: Secondary | ICD-10-CM | POA: Insufficient documentation

## 2013-12-14 DIAGNOSIS — Z8249 Family history of ischemic heart disease and other diseases of the circulatory system: Secondary | ICD-10-CM | POA: Insufficient documentation

## 2013-12-14 DIAGNOSIS — Z8673 Personal history of transient ischemic attack (TIA), and cerebral infarction without residual deficits: Secondary | ICD-10-CM | POA: Insufficient documentation

## 2013-12-14 DIAGNOSIS — R0989 Other specified symptoms and signs involving the circulatory and respiratory systems: Secondary | ICD-10-CM | POA: Insufficient documentation

## 2013-12-14 DIAGNOSIS — Z87891 Personal history of nicotine dependence: Secondary | ICD-10-CM | POA: Insufficient documentation

## 2013-12-14 DIAGNOSIS — I509 Heart failure, unspecified: Secondary | ICD-10-CM | POA: Insufficient documentation

## 2013-12-14 DIAGNOSIS — R55 Syncope and collapse: Secondary | ICD-10-CM

## 2013-12-14 DIAGNOSIS — R0602 Shortness of breath: Secondary | ICD-10-CM

## 2013-12-14 DIAGNOSIS — R079 Chest pain, unspecified: Secondary | ICD-10-CM

## 2013-12-14 DIAGNOSIS — I739 Peripheral vascular disease, unspecified: Secondary | ICD-10-CM | POA: Insufficient documentation

## 2013-12-14 DIAGNOSIS — I10 Essential (primary) hypertension: Secondary | ICD-10-CM | POA: Insufficient documentation

## 2013-12-14 DIAGNOSIS — E785 Hyperlipidemia, unspecified: Secondary | ICD-10-CM | POA: Insufficient documentation

## 2013-12-14 DIAGNOSIS — R0609 Other forms of dyspnea: Secondary | ICD-10-CM | POA: Insufficient documentation

## 2013-12-14 DIAGNOSIS — I779 Disorder of arteries and arterioles, unspecified: Secondary | ICD-10-CM | POA: Insufficient documentation

## 2013-12-14 MED ORDER — REGADENOSON 0.4 MG/5ML IV SOLN
0.4000 mg | Freq: Once | INTRAVENOUS | Status: AC
  Administered 2013-12-14: 0.4 mg via INTRAVENOUS

## 2013-12-14 MED ORDER — TECHNETIUM TC 99M SESTAMIBI GENERIC - CARDIOLITE
30.0000 | Freq: Once | INTRAVENOUS | Status: AC | PRN
  Administered 2013-12-14: 30 via INTRAVENOUS

## 2013-12-14 MED ORDER — AMINOPHYLLINE 25 MG/ML IV SOLN
407.0000 mg | Freq: Two times a day (BID) | INTRAVENOUS | Status: DC | PRN
  Administered 2013-12-14: 407 mg via INTRAVENOUS

## 2013-12-14 MED ORDER — TECHNETIUM TC 99M SESTAMIBI GENERIC - CARDIOLITE
10.0000 | Freq: Once | INTRAVENOUS | Status: AC | PRN
  Administered 2013-12-14: 10 via INTRAVENOUS

## 2013-12-14 NOTE — Progress Notes (Signed)
  [facility] S[facility] N[facility] [address] [place], [place] [address] [phone]    Cardiology Nuclear Med Study  [name] ConesMae L [name] CiscoLand is a 78 y.o. female     MRN : [id number]     DOB: 03/31/1936  Procedure Date: 12/14/2013  Nuclear Med Background Indication for Stress Test:  Evaluation for Ischemia History:  Chronic AFIB, CHF,08/2011 MPI: EF: 62% Scar fixed anteroseptal defect (-) ischemia 1/15 ECHO EF: 55-60%, 04/2012 PTVP Cardiac Risk Factors: Carotid Disease, Family History - CAD, History of Smoking, Hypertension, Lipids, NIDDM, PVD and TIA  Symptoms:  Chest Pain, DOE and Syncope   Nuclear Pre-Procedure Caffeine/Decaff Intake:  None NPO After: 7:30am   Lungs:  clear O2 Sat: 99% on room air. IV 0.9% NS with Angio Cath:  22g  IV Site: R Wrist  IV Started by:  Cathlyn Parsonsynthia Hasspacher, RN  Chest Size (in):  34 Cup Size: B  Height: 5\' 4"  (1.626 m)  Weight:  124 lb (56.246 kg)  BMI:  Body mass index is 21.27 kg/(m^2). Tech Comments:  No Zebeta x 24 hrs    Nuclear Med Study 1 or 2 day study: 1 day  Stress Test Type:  Lexiscan  Reading MD: n/a  Order Authorizing Provider:  Villa Herbhomas Brackbill,MD  Resting Radionuclide: Technetium 1610m Sestamibi  Resting Radionuclide Dose: 10.8 mCi   Stress Radionuclide:  Technetium 5310m Sestamibi  Stress Radionuclide Dose: 33.0 mCi           Stress Protocol Rest HR: 67 Stress HR: 83  Rest BP: 125/76 Stress BP: 157/71  Exercise Time (min): n/a METS: n/a   Predicted Max HR: 145 bpm % Max HR: 56.55 bpm Rate Pressure Product: [id number]   Dose of Adenosine (mg):  n/a Dose of Lexiscan: 0.4 mg  Dose of Atropine (mg): n/a Dose of Dobutamine: n/a mcg/kg/min (at max HR)  Stress Test Technologist: Milana NaSabrina Williams, EMT-P  Nuclear Technologist:  Domenic PoliteStephen Carbone, CNMT     Rest Procedure:  Myocardial perfusion imaging was performed at rest 45 minutes following the intravenous administration of Technetium 4310m Sestamibi. Rest ECG: Atrial fibrillation, LVH with  repolarization abnormalities.  Stress Procedure:  The patient received IV Lexiscan 0.4 mg over 15-seconds.  Technetium 2810m Sestamibi injected at 30-seconds. This patient had a headache and sob with the Lexiscan injection. Quantitative spect images were obtained after a 45 minute delay. Stress ECG: No significant change from baseline ECG  QPS Raw Data Images:  Normal; no motion artifact; normal heart/lung ratio. Stress Images:  Normal homogeneous uptake in all areas of the myocardium. Rest Images:  Normal homogeneous uptake in all areas of the myocardium. Subtraction (SDS):  No evidence of ischemia. Transient Ischemic Dilatation (Normal <1.22):  1.21 Lung/Heart Ratio (Normal <0.45):  0.25  Quantitative Gated Spect Images QGS EDV:  72 ml QGS ESV:  34 ml  Impression Exercise Capacity:  Lexiscan with no exercise. BP Response:  Normal blood pressure response. Clinical Symptoms:  There is dyspnea. ECG Impression:  No significant ST segment change suggestive of ischemia. Comparison with Prior Nuclear Study: No images to compare  Overall Impression:  Normal stress nuclear study.  LV Ejection Fraction: 52%.  LV Wall Motion:  NL LV Function; NL Wall Motion  [name], Rumeal Cullipher, Rexene EdisonH 12/14/2013

## 2013-12-17 ENCOUNTER — Telehealth: Payer: Self-pay | Admitting: Cardiology

## 2013-12-17 NOTE — Telephone Encounter (Signed)
Lm w/daughter [name] that the stress test was normal.  Lm to call back if has any questions.

## 2013-12-17 NOTE — Telephone Encounter (Signed)
New Problem:  Pt's daughter is calling to hear her mom's nuc stress test results.

## 2013-12-20 ENCOUNTER — Ambulatory Visit (INDEPENDENT_AMBULATORY_CARE_PROVIDER_SITE_OTHER): Payer: Medicare Other | Admitting: Internal Medicine

## 2013-12-20 ENCOUNTER — Encounter: Payer: Self-pay | Admitting: Internal Medicine

## 2013-12-20 VITALS — BP 140/88 | HR 74 | Ht 64.0 in | Wt 126.6 lb

## 2013-12-20 DIAGNOSIS — J9610 Chronic respiratory failure, unspecified whether with hypoxia or hypercapnia: Secondary | ICD-10-CM

## 2013-12-20 DIAGNOSIS — J449 Chronic obstructive pulmonary disease, unspecified: Secondary | ICD-10-CM
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
## 2013-12-20 NOTE — Patient Instructions (Signed)
SEvere COPD is stable SEvere fatigue is due to lung, heart problems Continue oxygen and Symbicort and CNA care at home Return to see me in 6 months or sooner if needed  - CAT score at followup

## 2013-12-29 ENCOUNTER — Ambulatory Visit: Payer: Medicare Other | Admitting: Cardiology

## 2013-12-30 ENCOUNTER — Encounter: Payer: Self-pay | Admitting: Neurology

## 2013-12-30 ENCOUNTER — Ambulatory Visit (INDEPENDENT_AMBULATORY_CARE_PROVIDER_SITE_OTHER): Payer: Medicare Other | Admitting: Neurology

## 2013-12-30 VITALS — BP 131/74 | HR 87 | Ht 64.0 in | Wt 122.0 lb

## 2013-12-30 DIAGNOSIS — R5381 Other malaise: Secondary | ICD-10-CM

## 2013-12-30 DIAGNOSIS — R269 Unspecified abnormalities of gait and mobility: Secondary | ICD-10-CM

## 2013-12-30 DIAGNOSIS — R5383 Other fatigue: Secondary | ICD-10-CM

## 2013-12-30 NOTE — Progress Notes (Signed)
[facility]  PATIENT: [name] [name] DOB: 01/14/1936  HISTORICAL  Mrs. Hurley CiscoLand is a 78 years old right-handed Caucasian female, referred by her primary care physician Dr. Lupe Carneyean Mitchell for evaluation of gait difficulty.  She had past medical history of hypertension, diabetes, hyperlipidemia, atrial fibrillation, depression, anxiety, COPD, home oxygen dependent, presenting with gait difficulty since 2012  She fell in 2012, landed on her left parietal region, she has transient loss of consciousness, require stitches to her left skull, she had dizziness, was diagnosed with benign positional vertigo, underwent vestibular rehabilitation for 3 weeks, with improvement, but she still has intermittent episodes dizziness when lying on her right side, she felt spinning sensation, with mild hearing loss  She also has unsteady gait, difficulty walking, staggering to the right or left, subjective symmetric weakness of her bilateral lower extremity, no numbness, she has urinary incontinence since her fall in 2012,  She complains of low back pain, neck muscle pain, bilateral shoulder achy pain.  CT cervical spine showed multilevel degenerative disc and joint disease without focal neural impingement. no change since 01/06/2012. athritis is most  prominent at C3-4 through C5-6 on the left  She lives at home by herself prior to hospital admission in Nov 2014 for fall.   UPDATE 10/05/2013: She was discharged to NH for rehab since early Nov, 2014 following a fall, hospital admission, she fell again at nursing home with left eye bruise, she also complains of urinary urgency, history of occasionally incontinence for one year.  She is with her sister today, we have reviewed CAT scan together, there was moderate brain atrophy, extensive periventricular white matter disease, cervical spine has demonstrated multilevel degenerative disc disease, but no significant canal stenosis, she has T1 compression  fracture, which is new compared to previous scan in September, CT thoracic in September 2014 showed slight compression fractures of T3 and T8. These appear to be benign osteoporotic fractures. There is no protrusion of bone, disc, or hemorrhage in the spinal canal at the site of the fractures.  She has worsening gait difficulty, sister reported that she tends leaning backwards, with bilateral upgoing toes when first initiated her gait,  Laboratory showed a normal B12 1029, TSH  UPDATE Feb 19th 2015:  She continues to have significant gait difficulty, has Nursing assistant 24x7 since Jan 2015.  She has been back home since 10/2013, she has trouble with bowel and bladder urgency and had accidentally few times a week, she also complains of significant low back pain and neck pain, she has dense numbness at bilateral plantar feet. She has bilateral finger tips numbness, weakness of her hands, she could not turn on her oxygen, this are new since Nov 2014,   She fell again in January 2015, had a CAT scan of the brain, cervical, thoracic, lumbar spine, we have reviewed films together, detailed above, CAT scan of lumbar showed comminuted L4 body fracture which is unhealed, but likely non-acute. Height loss is up to 60%; moderate bony retropulsion and spinal canal stenosis.     REVIEW OF SYSTEMS: Full 14 system review of systems performed and notable only for Dizziness, low back pain, falling, incontinence, memory loss, dizziness, headaches,  ALLERGIES: Allergies  Allergen Reactions  . Calan (Verapamil Hcl) Other (See Comments)    Patient states it makes her "out of her mind"; bp bottoms out  . Crestor (Rosuvastatin Calcium) Other (See Comments)    Muscle pain  . Escitalopram Oxalate Other (See Comments)  Unknown   . Lipitor (Atorvastatin Calcium) Other (See Comments)    myalgia  . Lopressor (Metoprolol Tartrate) Other (See Comments)    Blood pressure bottoms out   . Norvasc (Amlodipine  Besylate) Other (See Comments)    fatigue  . Nsaids   . Prednisone     SWELLING  . Sulfa Antibiotics Nausea Only and Other (See Comments)    Makes her stomach hurt  . Vimovo (Naproxen-Esomeprazole)   . Penicillins Hives and Rash    HOME MEDICATIONS: Outpatient Prescriptions Prior to Visit  Medication Sig Dispense Refill  . aspirin 81 MG tablet Take 81 mg by mouth daily.      . bisoprolol (ZEBETA) 10 MG tablet Take 10 mg by mouth daily.      . budesonide-formoterol (SYMBICORT) 160-4.5 MCG/ACT inhaler Inhale 2 puffs into the lungs 2 (two) times daily.  1 Inhaler  0  . buPROPion (WELLBUTRIN XL) 300 MG 24 hr tablet Take 300 mg by mouth daily.      . calcium carbonate (TUMS) 500 MG chewable tablet Chew 1 tablet by mouth daily.      [name] conjugated estrogens (PREMARIN) vaginal cream Place 0.625 g vaginally daily as needed. For vaginal dryness      . diltiazem (CARDIZEM CD) 120 MG 24 hr capsule Take 120 mg by mouth 2 (two) times daily.       [name] diltiazem (CARDIZEM CD) 120 MG 24 hr capsule TAKE ONE CAPSULE TWICE A DAY  180 capsule  3  . famotidine (PEPCID) 20 MG tablet Take 20 mg by mouth 2 (two) times daily as needed. For GERD      . fluticasone (FLONASE) 50 MCG/ACT nasal spray Place 2 sprays into the nose daily.  16 g  3  . furosemide (LASIX) 40 MG tablet Take 40 mg by mouth 2 (two) times daily.       [name] gabapentin (NEURONTIN) 300 MG capsule Take 300-600 mg by mouth 4 (four) times daily. Takes 1 tablet in the am, 2 dinner and 2 at bedtime      . HYDROcodone-acetaminophen (NORCO) 10-325 MG per tablet Take 1 tablet by mouth every 6 (six) hours as needed. For pain      . LORazepam (ATIVAN) 0.5 MG tablet Take 0.5 mg by mouth every 6 (six) hours as needed. For anxiety      . losartan (COZAAR) 100 MG tablet Take 1 tablet (100 mg total) by mouth daily.  30 tablet  3  . metFORMIN (GLUCOPHAGE) 500 MG tablet Take 500 mg by mouth 2 (two) times daily.       . mirtazapine (REMERON) 45 MG tablet Take 45 mg by  mouth at bedtime.      Docia Barrier IN Inhale into the lungs as directed.      . potassium chloride SA (K-DUR,KLOR-CON) 20 MEQ tablet Take 20 mEq by mouth daily.       [name] PROAIR HFA 108 (90 BASE) MCG/ACT inhaler Inhale 2 puffs into the lungs every 6 (six) hours as needed. For shortness of breath/wheezing      . sertraline (ZOLOFT) 100 MG tablet Take 200 mg by mouth daily.       . simvastatin (ZOCOR) 10 MG tablet Take 1 tablet (10 mg total) by mouth at bedtime.  90 tablet  1  . temazepam (RESTORIL) 7.5 MG capsule Take 7.5 mg by mouth at bedtime as needed. Insomnia       No facility-administered medications prior to visit.  PAST MEDICAL HISTORY: Past Medical History  Diagnosis Date  . Hypertrophic cardiomyopathy   . Hypertension   . Osteoarthritis   . Situational mixed anxiety and depressive disorder   . Hypercholesterolemia   . Pacemaker 1998  . Falls frequently     coumadin stopped, now on ASA 81mg   . Pulmonary HTN     has had prior right heart cath in 2010; was felt that most likely due to elevated left sided pressures and would not benefit from vasodilator therapy  . Diabetes mellitus   . Oxygen dependent   . COPD (chronic obstructive pulmonary disease)   . TIA (transient ischemic attack)   . Atrial fibrillation   . CHF (congestive heart failure)     PAST SURGICAL HISTORY: Past Surgical History  Procedure Laterality Date  . Knee arthroplasty  2011    right knee  . Tonsillectomy    . Appendectomy    . Cardiac catheterization    . Insert / replace / remove pacemaker    . Other surgical history      hysterectomy-bening    FAMILY HISTORY: Family History  Problem Relation Age of Onset  . Other Father     died in plane crash  . Cancer Mother     lymphoma-NHL  . Coronary artery disease Grandchild   . Heart disease Maternal Grandmother     SOCIAL HISTORY: Social History  . Marital Status: Widowed    Spouse Name: N/A    Number of Children: 2  . Years of  Education: high school   Occupational History    Retired as Lawyer   Social History Main Topics  . Smoking status: Former Smoker -- 1.00 packs/day for 40 years    Types: Cigarettes    Quit date: 11/15/2005  . Smokeless tobacco: Never Used  . Alcohol Use: No  . Drug Use: No  . Sexually Active: No    Social History Narrative   Former smoker   Orthoptist and part-time since married. Patient is widowed.      Daughter- Alveda Reasons [phone]      Right handed.  Patient has high school education.       PHYSICAL EXAM    Filed Vitals:   05/24/13 1452  BP: 106/53  Pulse: 61  Height: 5\' 4"  (1.626 m)  Weight: 146 lb (66.225 kg)   Body mass index is 20.93 kg/(m^2).   Generalized: In no acute distress  Neck: Supple, no carotid bruits   Cardiac: Regular rate rhythm  Pulmonary: Clear to auscultation bilaterally  Musculoskeletal: No deformity  Neurological examination  Mentation: Alert oriented to time, place, history taking, and causual conversation, wearing oxygen tank.  Cranial nerve II-XII: Pupils were equal round reactive to light extraocular movements were full, visual field were full on confrontational test. facial sensation and strength were normal. hearing was intact to finger rubbing bilaterally. Uvula tongue midline.  head turning and shoulder shrug and were normal and symmetric.Tongue protrusion into cheek strength was normal.  Motor: She has mild bilateral lower extremity spasticity, mild bilateral hip flexion weakness,   Sensory: Decreased vibratory sensation to knee level, length dependent decreased to light touch pinprick at the distal leg   Coordination: Normal finger to nose, heel-to-shin bilaterally there was no truncal ataxia  Gait: Multiple attempts to get up from seated position by holding on her wheelchair, stiff  sissor gait, unsteady,    Deep tendon reflexes: Brachioradialis 2/2 biceps 2/2 triceps 2/2, patellar 3/3, Achilles 2/2,  plantar  responses were extensor bilaterally.   DIAGNOSTIC DATA (LABS, IMAGING, TESTING) - I reviewed patient records, labs, notes, testing and imaging myself where available.  Lab Results  Component Value Date   WBC 8.3 11/17/2013   HGB 15.0 11/17/2013   HCT 44.0 11/17/2013   MCV 97.8 11/17/2013   PLT 254 11/17/2013      Component Value Date/Time   NA 143 11/17/2013 1843   K 3.9 11/17/2013 1843   CL 99 11/17/2013 1843   CO2 27 11/17/2013 1843   GLUCOSE 161* 11/17/2013 1843   BUN 15 11/17/2013 1843   CREATININE 0.73 11/17/2013 1843   CALCIUM 10.1 11/17/2013 1843   PROT 7.4 08/05/2013 2018   ALBUMIN 4.3 08/05/2013 2018   AST 45* 08/05/2013 2018   ALT 13 08/05/2013 2018   ALKPHOS 64 08/05/2013 2018   BILITOT 0.8 08/05/2013 2018   GFRNONAA 82* 11/17/2013 1843   GFRAA >90 11/17/2013 1843   Lab Results  Component Value Date   CHOL 154 08/05/2013   HDL 52 08/05/2013   LDLCALC 78 08/05/2013   TRIG 122 08/05/2013   CHOLHDL 3.0 08/05/2013   Lab Results  Component Value Date   HGBA1C 6.4* 11/18/2013   Lab Results  Component Value Date   VITAMINB12 1029* 11/18/2013   Lab Results  Component Value Date   TSH 1.152 08/05/2013      ASSESSMENT AND PLAN  78 years old right-handed Caucasian female, with complicated past medical history, including COPD, oxygen dependent, atrial fibrillation, s/p pacemaker, not a candidate for MRI, on aspirin 81 mg a day, hypertension, hyperlipidemia, presenting with gait difficulty, hyperreflexiabilateral Babinski signs, she has significant osteoporosis, compression fracture of thoracic, and lumbar spine, she has evidence of cervical myelopathy, but she is not a good candidate for surgical intervention because of her multiple comorbidities, including COPD, home oxygen dependent,  I have suggested her moderate exercise, home assistance or assistance living for safety, she will return to clinic in 6 months,     60 minutes spent in coordinating her care, more than 50% time is in face-to-face  patient care,          Levert Feinstein, M.D. Ph.D.  [facility] [address] [place], [place] [address] [phone]

## 2014-01-01 DIAGNOSIS — J9610 Chronic respiratory failure, unspecified whether with hypoxia or hypercapnia: Secondary | ICD-10-CM | POA: Insufficient documentation

## 2014-01-01 NOTE — Assessment & Plan Note (Signed)
SEvere COPD is stable SEvere fatigue is due to lung, heart problems Continue oxygen and Symbicort and CNA care at home Return to see me in 6 months or sooner if needed  - CAT score at followup 

## 2014-01-03 ENCOUNTER — Telehealth: Payer: Self-pay | Admitting: Neurology

## 2014-01-03 NOTE — Telephone Encounter (Signed)
I have called her daughter Cordelia PenSherry, explained to her patient's situation, she has multiple compression fracture, multiple falling episode, she needs close supervision, stay in a  safe environment,

## 2014-01-03 NOTE — Telephone Encounter (Signed)
Cordelia PenSherry, PT's daughter called.  Her mother had a visit on 12-30-13 and at that appointment told the PT that she would need to have an appointment with both of her daughters with her.  Sherry didn't know if they needed an appointment in the office or if the conversation could be done over the phone.  Cordelia PenSherry stated that her schedule is a little more flexible than her sister's but she would need a little notice to schedule the appointment.  Her sister is off on Friday's.  Sherry asked if Dr. Terrace ArabiaYan or her nurse could call to discuss this with her.  If they receive her voicemail, she stated it was ok to leave a detailed message.  She also stated that she has time open tomorrow from 1:45 to 4:15 pm in which they could speak.  Cordelia PenSherry is a [employer]yers[employer] teacher and she also tutors.  Thank you.

## 2014-01-03 NOTE — Telephone Encounter (Signed)
Called patient's daughter Roanna RaiderSherri concerning patient stated to her daughters that Dr. Terrace ArabiaYan wanted to speak with both daughters concerning the patient. The daughter was able to be at the appt on 12/30/13 with the other daughter. Patient has an appt set up for 06/30/14, 6 mo f/u with CM. Patient's daughter would like a call back and if no answer please leave voice mail stating whether they need to come in to discuss patient's condition. Please advise.

## 2014-01-04 ENCOUNTER — Telehealth: Payer: Self-pay | Admitting: Neurology

## 2014-01-04 NOTE — Telephone Encounter (Signed)
Patient's daughter [name] PlummerSherri [name] calling to state that she spoke with someone yesterday regarding patient's care and has another question. Patient's daughter was vague about what it was and just said that she would like someone to give her a call.

## 2014-01-04 NOTE — Telephone Encounter (Signed)
I have called her daughter and explained to her daughter, Roanna RaiderSherri, she has hypereflexia, could indicate cervical spondylytic myelopathy, but she could not have MRI cervical due to pacemaker.

## 2014-01-04 NOTE — Telephone Encounter (Signed)
Patient's daughter [name] [name] called concerning the patient having falls and the daughter wanted to know if Dr. Terrace ArabiaYan diagnose the patient with anything that causes the falls. Patient's daughter would like a call back please at [phone]. Please advise.

## 2014-01-07 ENCOUNTER — Ambulatory Visit (INDEPENDENT_AMBULATORY_CARE_PROVIDER_SITE_OTHER): Payer: Medicare Other | Admitting: Cardiology

## 2014-01-07 ENCOUNTER — Encounter: Payer: Self-pay | Admitting: Cardiology

## 2014-01-07 ENCOUNTER — Ambulatory Visit: Payer: Medicare Other | Admitting: Cardiology

## 2014-01-07 VITALS — BP 138/69 | HR 82 | Ht 64.0 in | Wt 123.0 lb

## 2014-01-07 DIAGNOSIS — R269 Unspecified abnormalities of gait and mobility: Secondary | ICD-10-CM

## 2014-01-07 DIAGNOSIS — R0609 Other forms of dyspnea: Secondary | ICD-10-CM

## 2014-01-07 DIAGNOSIS — I2789 Other specified pulmonary heart diseases: Secondary | ICD-10-CM

## 2014-01-07 DIAGNOSIS — I4891 Unspecified atrial fibrillation: Secondary | ICD-10-CM

## 2014-01-07 DIAGNOSIS — R0989 Other specified symptoms and signs involving the circulatory and respiratory systems: Secondary | ICD-10-CM

## 2014-01-07 DIAGNOSIS — I272 Pulmonary hypertension, unspecified: Secondary | ICD-10-CM

## 2014-01-07 DIAGNOSIS — R55 Syncope and collapse: Secondary | ICD-10-CM

## 2014-01-07 DIAGNOSIS — R2681 Unsteadiness on feet: Secondary | ICD-10-CM

## 2014-01-07 DIAGNOSIS — I119 Hypertensive heart disease without heart failure: Secondary | ICD-10-CM

## 2014-01-07 MED ORDER — DILTIAZEM HCL ER COATED BEADS 240 MG PO CP24: 240.0000 mg | ORAL_CAPSULE | Freq: Every day | ORAL | Status: DC

## 2014-01-07 NOTE — Assessment & Plan Note (Signed)
Patient has an unsteady gait and uses a rolling walker.  I have encouraged her to always use it.

## 2014-01-07 NOTE — Patient Instructions (Signed)
Your physician recommends that you schedule a follow-up appointment in: 2 MONTHS WITH DR BRACKBILL  STOP DILTIAZEM 120 MG TWICE DAILY  START DILTIAZEM CD 240 MG ONCE DAILY

## 2014-01-07 NOTE — Progress Notes (Signed)
[name] [name] Date of Birth:  08/02/1936 [address] [facility] [address] [place], [place]  [address] [phone]         Fax   [phone]  History of Present Illness: This pleasant 78 year old Caucasian female is seen for a followup office visit.  The patient has a history of multiple falls.. she lives at home but has caregivers in place around-the-clock. She has a history of chronic atrial fibrillation.  Because of her frequent falls she is not on anticoagulation.  She has a pacemaker in place.  She has known LVH.  She had an echocardiogram on 03/01/13 and showed severe pulmonary hypertension with PA pressure 82 mmHg. her most recent echocardiogram of 08/06/13 showed an ejection fraction of 60-65% and her pulmonary artery pressure was 71 mmHg Her left ventricular ejection fraction was 60-65% and there was mild LVH.  Her most recent echocardiogram on 11/18/13 showed an ejection fraction of 55-60% and a pulmonary artery pressure of 60 and she has severe left atrial enlargement and moderate to severe right atrial enlargement.  She has a past history of hypertrophic cardiomyopathy although this was not demonstrated on her most recent echo.  In the hospital she was felt to be dehydrated and her Lasix was held.  It was subsequently restarted at 40 mg daily.  However presently she has stopped her Lasix because she has no peripheral edema and he is not having any evidence of fluid.  In the past she has not done well if she gets dehydrated because of her past history of hypertrophic cardiomyopathy. When last seen in the office on 11/29/13 the patient was complaining of chest pressure.  She had a Lexus scan Myoview on 12/14/13 which showed no ischemia and her ejection fraction was 52%.  Current Outpatient Prescriptions  Medication Sig Dispense Refill  . acetaminophen (TYLENOL) 500 MG tablet Take 500 mg by mouth every 6 (six) hours as needed for moderate pain.      . bisoprolol (ZEBETA) 10 MG tablet Take 10 mg by  mouth daily.      . budesonide-formoterol (SYMBICORT) 160-4.5 MCG/ACT inhaler Inhale 2 puffs into the lungs 2 (two) times daily.      [name] buPROPion (WELLBUTRIN XL) 300 MG 24 hr tablet Take 300 mg by mouth daily.      . calcium carbonate (TUMS - DOSED IN MG ELEMENTAL CALCIUM) 500 MG chewable tablet Chew 1 tablet by mouth 3 (three) times daily with meals.      . feeding supplement, ENSURE, (ENSURE) PUDG Take 1 Container by mouth daily.  30 Can  0  . furosemide (LASIX) 20 MG tablet Take 20 mg by mouth daily.      [name] gabapentin (NEURONTIN) 100 MG capsule Take 100 mg by mouth 3 (three) times daily.      [name] HYDROcodone-acetaminophen (NORCO) 7.5-325 MG per tablet Take 1 tablet by mouth every 6 (six) hours as needed for moderate pain.      [name] losartan (COZAAR) 100 MG tablet Take 100 mg by mouth daily.      . meclizine (ANTIVERT) 12.5 MG tablet Take 1 tablet (12.5 mg total) by mouth 3 (three) times daily as needed for dizziness.  30 tablet  0  . metFORMIN (GLUCOPHAGE) 500 MG tablet Take 500 mg by mouth 2 (two) times daily with a meal.      . potassium chloride (KLOR-CON) 20 MEQ packet Take 20 mEq by mouth daily.       . sertraline (ZOLOFT)  100 MG tablet Take 200 mg by mouth daily.       . simvastatin (ZOCOR) 20 MG tablet Take 20 mg by mouth at bedtime.       . temazepam (RESTORIL) 7.5 MG capsule Take 1 capsule (7.5 mg total) by mouth at bedtime as needed for sleep or anxiety. Insomnia  20 capsule  0  . diltiazem (CARDIZEM CD) 240 MG 24 hr capsule Take 1 capsule (240 mg total) by mouth daily.  90 capsule  3   No current facility-administered medications for this visit.    Allergies  Allergen Reactions  . Aspirin     Upsets stomach really bad  . Calan [Verapamil Hcl] Other (See Comments)    Patient states it makes her "out of her mind"; bp bottoms out  . Crestor [Rosuvastatin Calcium] Other (See Comments)    Muscle pain  . Escitalopram Oxalate Other (See Comments)    Unknown   . Lipitor [Atorvastatin  Calcium] Other (See Comments)    myalgia  . Lopressor [Metoprolol Tartrate] Other (See Comments)    Blood pressure bottoms out   . Nitroglycerin   . Norvasc [Amlodipine Besylate] Other (See Comments)    fatigue  . Nsaids Nausea Only  . Oxycodone   . Prednisone     SWELLING  . Sulfa Antibiotics Nausea Only and Other (See Comments)    Makes her stomach hurt  . Vimovo [Naproxen-Esomeprazole] Nausea Only  . Penicillins Hives and Rash    Patient Active Problem List   Diagnosis Date Noted  . Chronic respiratory failure 01/01/2014  . Chest pain   . UTI (urinary tract infection) 11/19/2013  . Compression fracture of L4 lumbar vertebra 11/18/2013  . Syncope 11/18/2013  . Protein-calorie malnutrition, severe 11/18/2013  . Syncope and collapse 11/17/2013  . Closed compression fracture of L4 lumbar vertebra 11/17/2013  . Gait difficulty 10/05/2013  . Fall (on)(from) incline, initial encounter 10/03/2013  . Essential hypertension, benign 10/03/2013  . Unsteady gait 10/03/2013  . Peripheral neuropathy 10/03/2013  . Back pain 08/06/2013  . Near syncope 08/06/2013  . Pulmonary hypertension 08/05/2013  . Abnormality of gait 05/24/2013  . Acute respiratory distress 05/08/2013  . Pulmonary edema, acute 05/08/2013  . Benign hypertensive heart disease without heart failure   . Pulmonary HTN   . Oxygen dependent   . COPD (chronic obstructive pulmonary disease)   . Fatigue due to cardiopulmonary and deconditioning 04/23/2013  . Memory deficit 02/07/2013  . Memory change 12/11/2012  . TIA (transient ischemic attack) 02/19/2012  . Chest pain at rest 08/05/2011  . Type II or unspecified type diabetes mellitus without mention of complication, uncontrolled 06/12/2011  . Fall 03/22/2011  . Mitral regurgitation 03/13/2011  . Hypertrophic cardiomyopathy   . Osteoarthritis   . Situational mixed anxiety and depressive disorder   . Pacemaker 02/26/2011  . Atrial fibrillation 01/28/2011  .  CHRONIC OBSTRUCTIVE PULMONARY DISEASE, MODERATE 05/23/2010  . DEPRESSIVE DISORDER NOT ELSEWHERE CLASSIFIED 04/11/2010  . HYPERLIPIDEMIA 05/31/2009  . HYPERTENSION 05/31/2009  . ALLERGIC RHINITIS 05/31/2009  . SHORTNESS OF BREATH 05/31/2009  . Cough 05/31/2009    History  Smoking status  . Former Smoker -- 1.00 packs/day for 40 years  . Types: Cigarettes  . Quit date: 11/15/2005  Smokeless tobacco  . Never Used    History  Alcohol Use No    Family History  Problem Relation Age of Onset  . Other Father     died in plane crash  . Cancer Mother  lymphoma-NHL  . Coronary artery disease Grandchild   . Heart disease Maternal Grandmother     Review of Systems: Constitutional: no fever chills diaphoresis or fatigue or change in weight.  Head and neck: no hearing loss, no epistaxis, no photophobia or visual disturbance. Respiratory: No cough, shortness of breath or wheezing. Cardiovascular: No chest pain peripheral edema, palpitations. Gastrointestinal: No abdominal distention, no abdominal pain, no change in bowel habits hematochezia or melena. Genitourinary: No dysuria, no frequency, no urgency, no nocturia. Musculoskeletal:No arthralgias, no back pain, no gait disturbance or myalgias. Neurological: No dizziness, no headaches, no numbness, no seizures, no syncope, no weakness, no tremors. Hematologic: No lymphadenopathy, no easy bruising. Psychiatric: No confusion, no hallucinations, no sleep disturbance.    Physical Exam: Filed Vitals:   01/07/14 1127  BP: 138/69  Pulse: 82   the general appearance reveals a chronically ill-appearing woman with nasal oxygen in place.  She is chronically pale.The head and neck exam reveals pupils equal and reactive.  Extraocular movements are full.  There is no scleral icterus.  The mouth and pharynx are normal.  The neck is supple.  The carotids reveal no bruits.  The jugular venous pressure is normal.  The  thyroid is not enlarged.   There is no lymphadenopathy.   Lungs reveal mild rhonchi. Expansion of the chest is symmetrical.  The precordium is quiet.  The pulse is regular and paced.  The first heart sound is normal.  The second heart sound is physiologically split.  There is no gallop rub or click.  There is a soft systolic murmur at the left sternal  There is no abnormal lift or heave.  The abdomen is soft and nontender.  The bowel sounds are normal.  The liver and spleen are not enlarged.  There are no abdominal masses.  There are no abdominal bruits.  Extremities reveal good pedal pulses.  There is no phlebitis or edema.  There is no cyanosis or clubbing.  Strength is normal and symmetrical in all extremities.  There is no lateralizing weakness.  There are no sensory deficits.  The skin is warm and dry.  There is no rash.    Assessment / Plan: Continue same medication except switch to long-acting diltiazem CD 240 mg daily. Were reviewed the good news from her Myoview stress test of 12/14/13 which showed no ischemia. Recheck in 2 months for followup office visit.

## 2014-01-07 NOTE — Assessment & Plan Note (Signed)
Patient remains in atrial fibrillation.  Ventricular rate is in the 90s.  Presently she is on diltiazem 120 mg twice a day.  We will switch her to diltiazem CD 240 mg once a day.

## 2014-01-07 NOTE — Assessment & Plan Note (Signed)
Her most recent echocardiogram 11/18/13 shows PA pressure of 60 which is improved from the initial echocardiogram in April 2014 her pulmonary artery pressure was 82

## 2014-01-07 NOTE — Assessment & Plan Note (Signed)
The etiology of her falls is not clear.  It is not necessarily true syncope with loss of conscious.  Her most recent fall occurred when she was trying to climb over the bed reveals to get out of bed at home.  She did hit her head in 3 places she states.  We talked about warning signs and symptoms of subdural hematoma that her caregivers can look for.

## 2014-01-31 ENCOUNTER — Other Ambulatory Visit: Payer: Self-pay | Admitting: Cardiology

## 2014-03-07 ENCOUNTER — Ambulatory Visit (INDEPENDENT_AMBULATORY_CARE_PROVIDER_SITE_OTHER): Payer: Medicare Other | Admitting: Cardiology

## 2014-03-07 ENCOUNTER — Encounter: Payer: Self-pay | Admitting: Cardiology

## 2014-03-07 VITALS — BP 122/56 | HR 99 | Ht 64.0 in | Wt 123.0 lb

## 2014-03-07 DIAGNOSIS — R0989 Other specified symptoms and signs involving the circulatory and respiratory systems: Secondary | ICD-10-CM

## 2014-03-07 DIAGNOSIS — G629 Polyneuropathy, unspecified: Secondary | ICD-10-CM

## 2014-03-07 DIAGNOSIS — G589 Mononeuropathy, unspecified: Secondary | ICD-10-CM

## 2014-03-07 DIAGNOSIS — Z9181 History of falling: Secondary | ICD-10-CM

## 2014-03-07 DIAGNOSIS — E785 Hyperlipidemia, unspecified: Secondary | ICD-10-CM | POA: Insufficient documentation

## 2014-03-07 DIAGNOSIS — R0609 Other forms of dyspnea: Secondary | ICD-10-CM

## 2014-03-07 DIAGNOSIS — R296 Repeated falls: Secondary | ICD-10-CM

## 2014-03-07 DIAGNOSIS — W19XXXA Unspecified fall, initial encounter: Secondary | ICD-10-CM

## 2014-03-07 DIAGNOSIS — R079 Chest pain, unspecified: Secondary | ICD-10-CM

## 2014-03-07 DIAGNOSIS — I4891 Unspecified atrial fibrillation: Secondary | ICD-10-CM

## 2014-03-07 NOTE — Assessment & Plan Note (Signed)
The patient has not been having any recent chest pain.  She has not had to take any recent nitroglycerin.

## 2014-03-07 NOTE — Patient Instructions (Signed)
STOP YOUR SIMVASTATIN  Your physician wants you to follow-up in: 3 month ov You will receive a reminder letter in the mail two months in advance. If you don't receive a letter, please call our office to schedule the follow-up appointment.

## 2014-03-07 NOTE — Assessment & Plan Note (Signed)
Since last visit the patient has continued to have falls.  She is being evaluated by neurology Dr. Leanna [facility]one at [facility].  He is working her up for various causes of neuropathy diagnosis 355.9. The patient continues to live in her own home.  However she now has round-the-clock caregivers to help her.

## 2014-03-07 NOTE — Assessment & Plan Note (Signed)
The patient remains in atrial fibrillation with a controlled ventricular response.  She remains off anticoagulation because of continuing to have frequent falls.  She has not had any TIA or stroke symptoms.

## 2014-03-07 NOTE — Assessment & Plan Note (Signed)
The patient has a history of hypercholesterolemia.  Her neurologist has suggested that she stop her simvastatin.  He is concerned that it may be contributing to her weakness apparently.  This is certainly reasonable and I concur with his recommendation to stop simvastatin.

## 2014-03-07 NOTE — Progress Notes (Signed)
[name] [name] Date of Birth:  02-22-1936 [address] [place], [place]  [address] [phone]         Fax   [phone]  History of Present Illness: This pleasant 78 year old Caucasian female is seen for a followup office visit.  The patient has a history of multiple falls.. she lives at home but has caregivers in place around-the-clock. She has a history of chronic atrial fibrillation.  Because of her frequent falls she is not on anticoagulation.  She has a pacemaker in place.  She has known LVH.  She had an echocardiogram on 03/01/13 and showed severe pulmonary hypertension with PA pressure 82 mmHg. her most recent echocardiogram of 08/06/13 showed an ejection fraction of 60-65% and her pulmonary artery pressure was 71 mmHg Her left ventricular ejection fraction was 60-65% and there was mild LVH.  Her most recent echocardiogram on 11/18/13 showed an ejection fraction of 55-60% and a pulmonary artery pressure of 60 and she has severe left atrial enlargement and moderate to severe right atrial enlargement.  She has a past history of hypertrophic cardiomyopathy although this was not demonstrated on her most recent echo.  In the hospital she was felt to be dehydrated and her Lasix was held.  Patient is on Lasix 20 mg daily and is not having any evidence of fluid retention at this dose.  In the past she has not done well if she gets dehydrated because of her past history of hypertrophic cardiomyopathy. When last seen in the office on 11/29/13 the patient was complaining of chest pressure.  She had a Lexus scan Myoview on 12/14/13 which showed no ischemia and her ejection fraction was 52%. Because of her frequent falls she has seen neurology at [facility]  She has seen Dr. Milon Dikes.  Dr. Leanna Battles sent a request for her lab work which we are attempting to obtain through [facility].  Current Outpatient Prescriptions  Medication Sig Dispense Refill  . acetaminophen (TYLENOL) 500 MG tablet  Take 500 mg by mouth every 6 (six) hours as needed for moderate pain.      . bisoprolol (ZEBETA) 10 MG tablet Take 10 mg by mouth daily.      . budesonide-formoterol (SYMBICORT) 160-4.5 MCG/ACT inhaler Inhale 2 puffs into the lungs 2 (two) times daily.      [name] buPROPion (WELLBUTRIN XL) 300 MG 24 hr tablet Take 300 mg by mouth daily.      . calcium carbonate (TUMS - DOSED IN MG ELEMENTAL CALCIUM) 500 MG chewable tablet Chew 1 tablet by mouth 3 (three) times daily with meals. Only as needed      . diltiazem (CARDIZEM CD) 240 MG 24 hr capsule Take 1 capsule (240 mg total) by mouth daily.  90 capsule  3  . feeding supplement, ENSURE, (ENSURE) PUDG Take 1 Container by mouth daily.  30 Can  0  . furosemide (LASIX) 20 MG tablet Take 20 mg by mouth daily.      [name] gabapentin (NEURONTIN) 100 MG capsule Take 100 mg by mouth 3 (three) times daily.      [name] HYDROcodone-acetaminophen (NORCO) 7.5-325 MG per tablet Take 1 tablet by mouth every 6 (six) hours as needed for moderate pain.      [name] losartan (COZAAR) 50 MG tablet TAKE 1 TABLET (50 MG TOTAL) BY MOUTH DAILY.  30 tablet  3  . meclizine (ANTIVERT) 12.5 MG tablet Take 1 tablet (12.5 mg total) by mouth 3 (  three) times daily as needed for dizziness.  30 tablet  0  . metFORMIN (GLUCOPHAGE) 500 MG tablet Take 500 mg by mouth 2 (two) times daily with a meal.      . potassium chloride (KLOR-CON) 20 MEQ packet Take 20 mEq by mouth daily.       . sertraline (ZOLOFT) 100 MG tablet Take 200 mg by mouth daily.       . temazepam (RESTORIL) 7.5 MG capsule Take 1 capsule (7.5 mg total) by mouth at bedtime as needed for sleep or anxiety. Insomnia  20 capsule  0   No current facility-administered medications for this visit.    Allergies  Allergen Reactions  . Aspirin     Upsets stomach really bad  . Calan [Verapamil Hcl] Other (See Comments)    Patient states it makes her "out of her mind"; bp bottoms out  . Crestor [Rosuvastatin Calcium] Other (See Comments)    Muscle  pain  . Escitalopram Oxalate Other (See Comments)    Unknown   . Lipitor [Atorvastatin Calcium] Other (See Comments)    myalgia  . Lopressor [Metoprolol Tartrate] Other (See Comments)    Blood pressure bottoms out   . Nitroglycerin   . Norvasc [Amlodipine Besylate] Other (See Comments)    fatigue  . Nsaids Nausea Only  . Oxycodone   . Prednisone     SWELLING  . Sulfa Antibiotics Nausea Only and Other (See Comments)    Makes her stomach hurt  . Vimovo [Naproxen-Esomeprazole] Nausea Only  . Penicillins Hives and Rash    Patient Active Problem List   Diagnosis Date Noted  . Chronic respiratory failure 01/01/2014  . Chest pain   . UTI (urinary tract infection) 11/19/2013  . Compression fracture of L4 lumbar vertebra 11/18/2013  . Syncope 11/18/2013  . Protein-calorie malnutrition, severe 11/18/2013  . Syncope and collapse 11/17/2013  . Closed compression fracture of L4 lumbar vertebra 11/17/2013  . Gait difficulty 10/05/2013  . Fall (on)(from) incline, initial encounter 10/03/2013  . Essential hypertension, benign 10/03/2013  . Unsteady gait 10/03/2013  . Peripheral neuropathy 10/03/2013  . Back pain 08/06/2013  . Near syncope 08/06/2013  . Pulmonary hypertension 08/05/2013  . Abnormality of gait 05/24/2013  . Acute respiratory distress 05/08/2013  . Pulmonary edema, acute 05/08/2013  . Benign hypertensive heart disease without heart failure   . Pulmonary HTN   . Oxygen dependent   . COPD (chronic obstructive pulmonary disease)   . Fatigue due to cardiopulmonary and deconditioning 04/23/2013  . Memory deficit 02/07/2013  . Memory change 12/11/2012  . TIA (transient ischemic attack) 02/19/2012  . Chest pain at rest 08/05/2011  . Type II or unspecified type diabetes mellitus without mention of complication, uncontrolled 06/12/2011  . Fall 03/22/2011  . Mitral regurgitation 03/13/2011  . Hypertrophic cardiomyopathy   . Osteoarthritis   . Situational mixed anxiety and  depressive disorder   . Pacemaker 02/26/2011  . Atrial fibrillation 01/28/2011  . CHRONIC OBSTRUCTIVE PULMONARY DISEASE, MODERATE 05/23/2010  . DEPRESSIVE DISORDER NOT ELSEWHERE CLASSIFIED 04/11/2010  . HYPERLIPIDEMIA 05/31/2009  . HYPERTENSION 05/31/2009  . ALLERGIC RHINITIS 05/31/2009  . SHORTNESS OF BREATH 05/31/2009  . Cough 05/31/2009    History  Smoking status  . Former Smoker -- 1.00 packs/day for 40 years  . Types: Cigarettes  . Quit date: 11/15/2005  Smokeless tobacco  . Never Used    History  Alcohol Use No    Family History  Problem Relation Age of Onset  .  Other Father     died in plane crash  . Cancer Mother     lymphoma-NHL  . Coronary artery disease Grandchild   . Heart disease Maternal Grandmother     Review of Systems: Constitutional: no fever chills diaphoresis or fatigue or change in weight.  Head and neck: no hearing loss, no epistaxis, no photophobia or visual disturbance. Respiratory: No cough, shortness of breath or wheezing. Cardiovascular: No chest pain peripheral edema, palpitations. Gastrointestinal: No abdominal distention, no abdominal pain, no change in bowel habits hematochezia or melena. Genitourinary: No dysuria, no frequency, no urgency, no nocturia. Musculoskeletal:No arthralgias, no back pain, no gait disturbance or myalgias. Neurological: No dizziness, no headaches, no numbness, no seizures, no syncope, no weakness, no tremors. Hematologic: No lymphadenopathy, no easy bruising. Psychiatric: No confusion, no hallucinations, no sleep disturbance.    Physical Exam: Filed Vitals:   03/07/14 1352  BP: 122/56  Pulse: 99   the general appearance reveals a chronically ill-appearing woman with nasal oxygen in place.  She is chronically pale.The head and neck exam reveals pupils equal and reactive.  Extraocular movements are full.  There is no scleral icterus.  The mouth and pharynx are normal.  The neck is supple.  The carotids reveal  no bruits.  The jugular venous pressure is normal.  The  thyroid is not enlarged.  There is no lymphadenopathy.   Lungs reveal mild rhonchi. Expansion of the chest is symmetrical.  The precordium is quiet.  The pulse is regular and paced.  The first heart sound is normal.  The second heart sound is physiologically split.  There is no gallop rub or click.  There is a soft systolic murmur at the left sternal  There is no abnormal lift or heave.  The abdomen is soft and nontender.  The bowel sounds are normal.  The liver and spleen are not enlarged.  There are no abdominal masses.  There are no abdominal bruits.  Extremities reveal good pedal pulses.  There is no phlebitis or edema.  There is no cyanosis or clubbing.  Strength is normal and symmetrical in all extremities.  There is no lateralizing weakness.  There are no sensory deficits.  The skin is warm and dry.  There is no rash.    Assessment / Plan: Continue same medication except switch to long-acting diltiazem CD 240 mg daily.  She is no longer on simvastatin because of her leg weakness and frequent falls.    her Myoview stress test of 12/14/13 which showed no ischemia. Recheck in 3 months for followup office visit.

## 2014-03-21 ENCOUNTER — Telehealth: Payer: Self-pay | Admitting: Cardiology

## 2014-03-21 NOTE — Telephone Encounter (Signed)
Spoke with patient's daughter who called to get clarification on patient's medications as patient may be going into assisted living and daughter wants to make certain medications are correct before submitting list to provider. I clarified with daughter the patient's prescribed medications.  Daughter reports that patient states that Dr. Patty SermonsBrackbill advised her in the past that she could take Lasix only as needed and to take the potassium every time she takes the Lasix.  I advised daughter that I do not see this information in the patient's office visit.  Daughter reports that patient has not been taking Bisoprolol, Lasix, KDur, or Cozaar.  I clarified dosages and schedule as prescribed by Dr. Patty SermonsBrackbill and daughter recorded this information.  Daughter requested that I send message to Dr. Patty SermonsBrackbill for clarification on the Lasix/KDur dosage.  I advised daughter that Regis BillMelinda Pratt and Dr. Patty SermonsBrackbill are both out of the office today and to expect a call back later in the week.  Patient's daughter verbalized understanding and agreement.

## 2014-03-21 NOTE — Telephone Encounter (Signed)
New problem   Pt's daughter need to speak to nurse concerning pt's medications. They need to verify what she is taking.

## 2014-03-21 NOTE — Telephone Encounter (Signed)
DC the standing order for Lasix 20.  Make it just PRN for severe swelling. If she takes the lasix, also take the KDur.

## 2014-03-22 NOTE — Telephone Encounter (Signed)
Left message to call back  

## 2014-03-23 NOTE — Telephone Encounter (Signed)
Follow up    Patient daughter calling    Have a 45 minutes window between class please call back before 10:00 am.

## 2014-03-23 NOTE — Telephone Encounter (Signed)
Left message to call back  

## 2014-03-24 NOTE — Telephone Encounter (Signed)
Agree keep meds as she is currently taking.

## 2014-03-24 NOTE — Telephone Encounter (Signed)
Spoke with patients daughter regarding patients medications. Did advise her regarding the potassium and lasix. Daughter inquired about the Losartan and Bisoprolol which are both on medication list but patient had not been taking. She did speak with patients pharmacy and Bisoprolol has not been filled since January 2014. She spoke with Alcario DroughtErica the caregiver and patient did start back on the Losartan at 50 mg daily on 03/21/14. Alcario Droughtrica does daily blood pressure checks on the patient and readings have been 132-142/70's-93 with readings this week 5/11 128/69, 5/12 132/74, and 5/13 142/93. Advised to continue current regimen and would forward this information to  Dr. Patty SermonsBrackbill for review.

## 2014-03-24 NOTE — Telephone Encounter (Signed)
Follow up      Daughter calling back wanted to let Juliette AlcideMelinda know what patient is taken.

## 2014-03-24 NOTE — Telephone Encounter (Signed)
Left message to call back  

## 2014-03-25 NOTE — Telephone Encounter (Signed)
Advised daughter 

## 2014-03-25 NOTE — Telephone Encounter (Signed)
Follow up ° ° ° °Returning Melinda's call from yesterday °

## 2014-03-28 IMAGING — CR DG CHEST 2V
2 series · 2 of 2 positions shown · non-contrast
Comparison: Plain films of the chest 02/11/2012, 12/21/2011 and
03/22/2011.  CT chest 07/05/2010.

CLINICAL DATA: Shortness of breath.

CHEST - 2 VIEW

[view not recorded (1 of 2)]
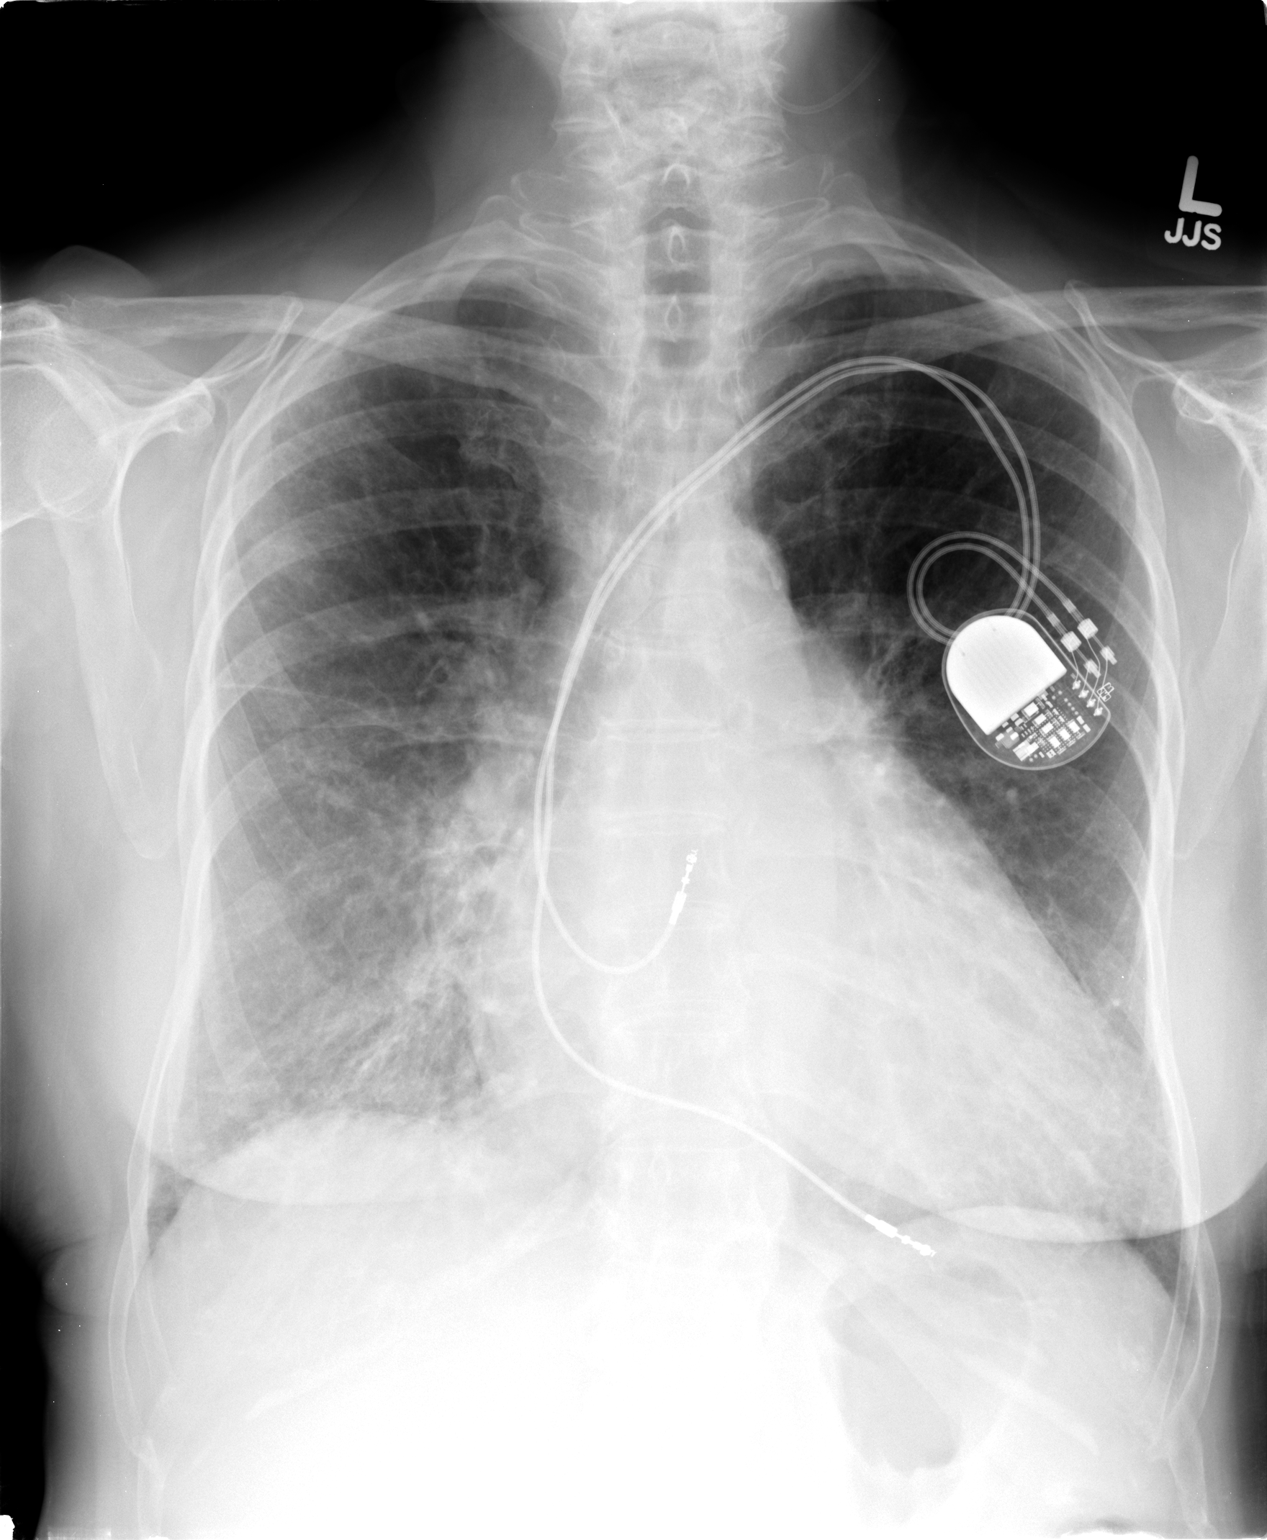

[view not recorded (2 of 2)]
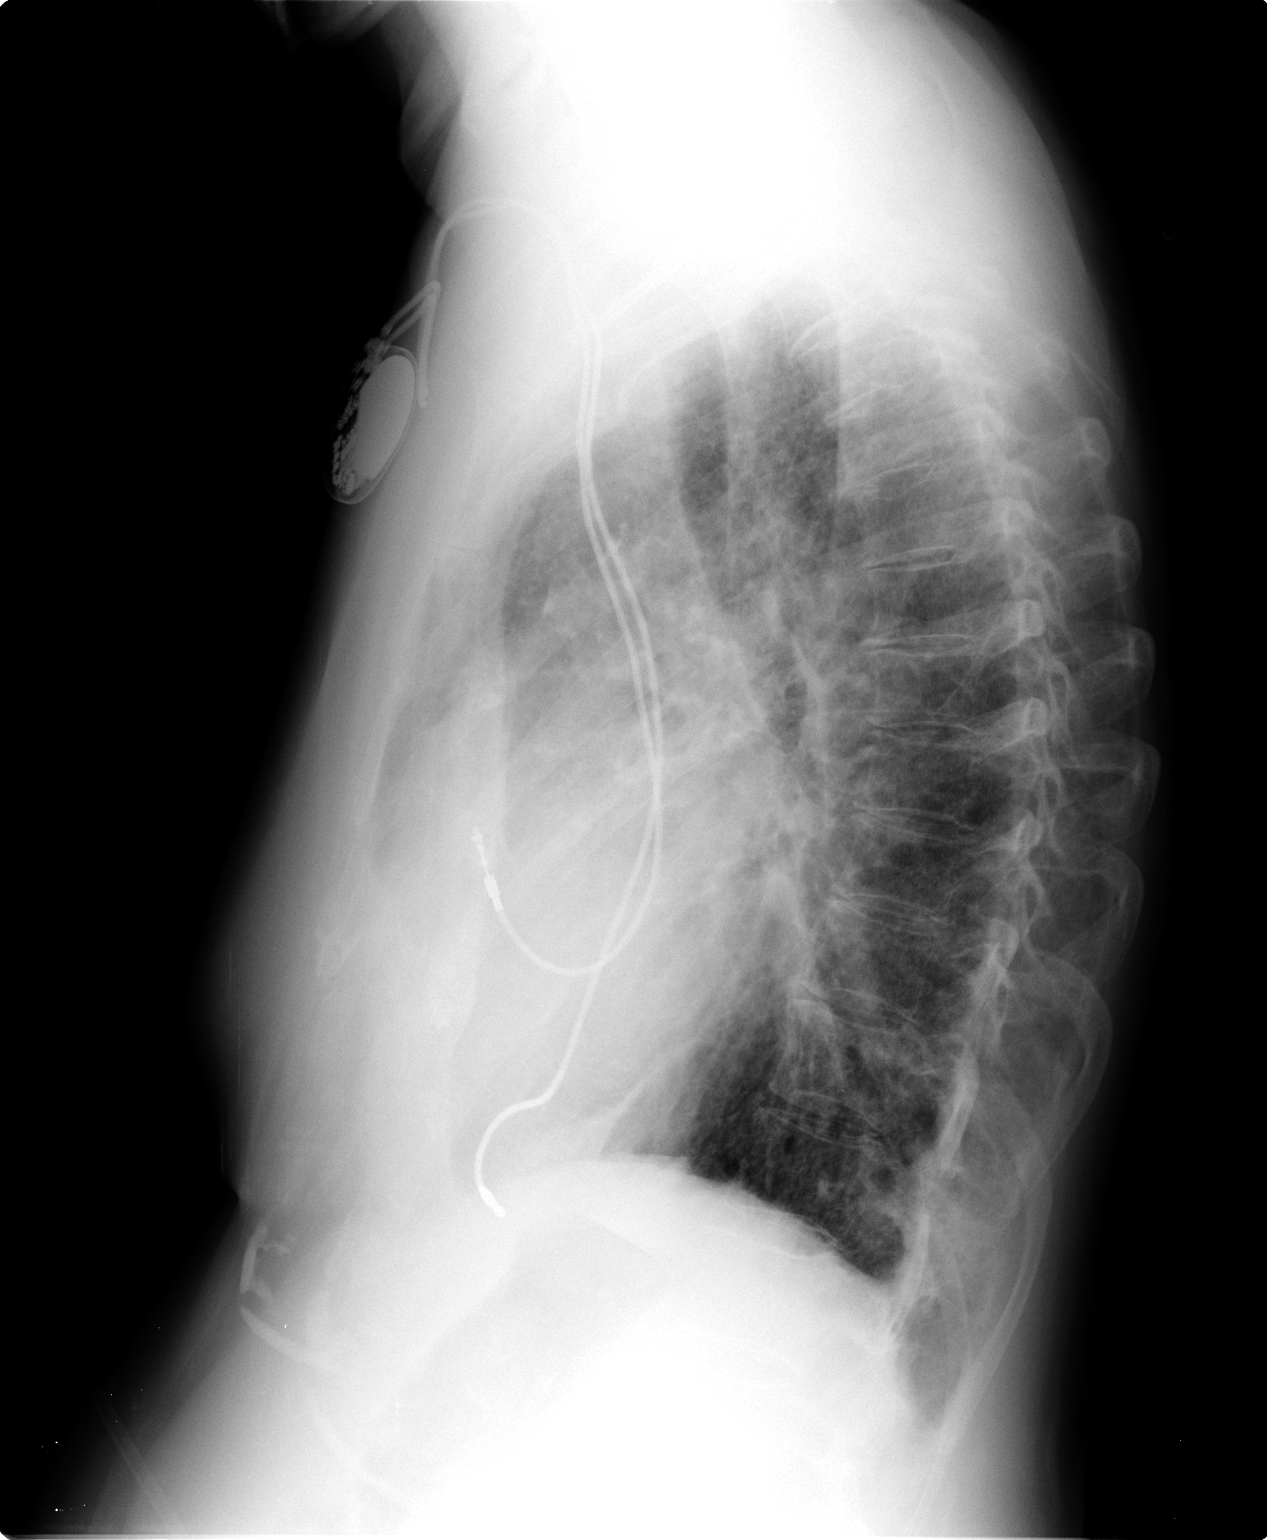

[2 of 2 positions shown; findings below may reference images not displayed]

FINDINGS: Pacing device is in place.  There is marked
cardiomegaly.  Extensive, chronic airspace opacity is seen
bilaterally and much worse on the right.  No pneumothorax or
pleural fluid.
IMPRESSION: No change in cardiomegaly and pulmonary fibrosis.  No acute
abnormality.

## 2014-04-04 ENCOUNTER — Other Ambulatory Visit: Payer: Self-pay | Admitting: Internal Medicine

## 2014-04-05 ENCOUNTER — Encounter: Payer: Self-pay | Admitting: Cardiology

## 2014-04-08 ENCOUNTER — Telehealth: Payer: Self-pay | Admitting: Neurology

## 2014-04-08 ENCOUNTER — Other Ambulatory Visit: Payer: Self-pay

## 2014-04-08 MED ORDER — FUROSEMIDE 20 MG PO TABS: 20.0000 mg | ORAL_TABLET | ORAL | Status: DC | PRN

## 2014-04-08 NOTE — Telephone Encounter (Signed)
I called back and spoke with Ms [name] [name].  She said Dr Vear Clock has been prescribing Gabapentin for the patient, not [place].  She will contact his office regarding this med.  Says she will also contact Dr Clovis Riley (PCP) to ensure meds are correct for facility.  They will call us back if anything further is needed.

## 2014-04-08 NOTE — Telephone Encounter (Signed)
Patient's daughter Clydie Braun calling to state that patient is on the way to [facility] and that her medication script needs to be rewritten since patient is taking 3 pills of 300 mg of Gabapentin a day. Patient's daughter has some concerns regarding medication management while she is at the nursing facility. Please return call as soon as possible.

## 2014-04-08 NOTE — Telephone Encounter (Signed)
Called pt's pharmacy at [facility] to inform them per Shanda Bumps, CPhT that this office never prescribe this medication Gabapentin and that the pt chart showed [facility] filled that medication last and that she might need to contact them about the medication change. I advised them if they have any other questions to give our office a call.

## 2014-04-16 ENCOUNTER — Emergency Department (HOSPITAL_COMMUNITY): Payer: Medicare Other

## 2014-04-16 ENCOUNTER — Encounter (HOSPITAL_COMMUNITY): Payer: Self-pay | Admitting: Emergency Medicine

## 2014-04-16 ENCOUNTER — Emergency Department (HOSPITAL_COMMUNITY)
Admission: EM | Admit: 2014-04-16 | Discharge: 2014-04-16 | Disposition: A | Discharge status: Home / Self Care | Payer: Medicare Other | Attending: Emergency Medicine | Admitting: Emergency Medicine

## 2014-04-16 DIAGNOSIS — M1990 Unspecified osteoarthritis, unspecified site: Secondary | ICD-10-CM | POA: Insufficient documentation

## 2014-04-16 DIAGNOSIS — Z950 Presence of cardiac pacemaker: Secondary | ICD-10-CM | POA: Insufficient documentation

## 2014-04-16 DIAGNOSIS — F329 Major depressive disorder, single episode, unspecified: Secondary | ICD-10-CM | POA: Diagnosis not present

## 2014-04-16 DIAGNOSIS — Z8673 Personal history of transient ischemic attack (TIA), and cerebral infarction without residual deficits: Secondary | ICD-10-CM | POA: Diagnosis not present

## 2014-04-16 DIAGNOSIS — Y929 Unspecified place or not applicable: Secondary | ICD-10-CM | POA: Insufficient documentation

## 2014-04-16 DIAGNOSIS — Y939 Activity, unspecified: Secondary | ICD-10-CM | POA: Diagnosis not present

## 2014-04-16 DIAGNOSIS — J449 Chronic obstructive pulmonary disease, unspecified: Secondary | ICD-10-CM | POA: Insufficient documentation

## 2014-04-16 DIAGNOSIS — Z79899 Other long term (current) drug therapy: Secondary | ICD-10-CM | POA: Insufficient documentation

## 2014-04-16 DIAGNOSIS — I4891 Unspecified atrial fibrillation: Secondary | ICD-10-CM | POA: Insufficient documentation

## 2014-04-16 DIAGNOSIS — S0993XA Unspecified injury of face, initial encounter: Secondary | ICD-10-CM | POA: Diagnosis not present

## 2014-04-16 DIAGNOSIS — Z9181 History of falling: Secondary | ICD-10-CM | POA: Diagnosis not present

## 2014-04-16 DIAGNOSIS — I10 Essential (primary) hypertension: Secondary | ICD-10-CM | POA: Diagnosis not present

## 2014-04-16 DIAGNOSIS — Z9889 Other specified postprocedural states: Secondary | ICD-10-CM | POA: Insufficient documentation

## 2014-04-16 DIAGNOSIS — R51 Headache: Secondary | ICD-10-CM | POA: Insufficient documentation

## 2014-04-16 DIAGNOSIS — Z8701 Personal history of pneumonia (recurrent): Secondary | ICD-10-CM | POA: Diagnosis not present

## 2014-04-16 DIAGNOSIS — F3289 Other specified depressive episodes: Secondary | ICD-10-CM | POA: Insufficient documentation

## 2014-04-16 DIAGNOSIS — Z862 Personal history of diseases of the blood and blood-forming organs and certain disorders involving the immune mechanism: Secondary | ICD-10-CM | POA: Diagnosis not present

## 2014-04-16 DIAGNOSIS — Z8739 Personal history of other diseases of the musculoskeletal system and connective tissue: Secondary | ICD-10-CM | POA: Insufficient documentation

## 2014-04-16 DIAGNOSIS — I509 Heart failure, unspecified: Secondary | ICD-10-CM | POA: Diagnosis not present

## 2014-04-16 DIAGNOSIS — Z9981 Dependence on supplemental oxygen: Secondary | ICD-10-CM | POA: Insufficient documentation

## 2014-04-16 DIAGNOSIS — Z8719 Personal history of other diseases of the digestive system: Secondary | ICD-10-CM | POA: Insufficient documentation

## 2014-04-16 DIAGNOSIS — S199XXA Unspecified injury of neck, initial encounter: Principal | ICD-10-CM

## 2014-04-16 DIAGNOSIS — E119 Type 2 diabetes mellitus without complications: Secondary | ICD-10-CM | POA: Insufficient documentation

## 2014-04-16 DIAGNOSIS — W010XXA Fall on same level from slipping, tripping and stumbling without subsequent striking against object, initial encounter: Secondary | ICD-10-CM | POA: Insufficient documentation

## 2014-04-16 DIAGNOSIS — Z880 Allergy status to penicillin: Secondary | ICD-10-CM | POA: Diagnosis not present

## 2014-04-16 DIAGNOSIS — IMO0002 Reserved for concepts with insufficient information to code with codable children: Secondary | ICD-10-CM | POA: Insufficient documentation

## 2014-04-16 DIAGNOSIS — Z87891 Personal history of nicotine dependence: Secondary | ICD-10-CM | POA: Insufficient documentation

## 2014-04-16 DIAGNOSIS — F411 Generalized anxiety disorder: Secondary | ICD-10-CM | POA: Diagnosis not present

## 2014-04-16 DIAGNOSIS — J4489 Other specified chronic obstructive pulmonary disease: Secondary | ICD-10-CM | POA: Insufficient documentation

## 2014-04-16 DIAGNOSIS — S0990XA Unspecified injury of head, initial encounter: Secondary | ICD-10-CM | POA: Diagnosis present

## 2014-04-16 LAB — COMPREHENSIVE METABOLIC PANEL
ALT: 11 U/L (ref 0–35)
AST: 25 U/L (ref 0–37)
Albumin: 4.2 g/dL (ref 3.5–5.2)
Alkaline Phosphatase: 98 U/L (ref 39–117)
BUN: 11 mg/dL (ref 6–23)
CALCIUM: 10.3 mg/dL (ref 8.4–10.5)
CHLORIDE: 99 meq/L (ref 96–112)
CO2: 29 meq/L (ref 19–32)
CREATININE: 0.68 mg/dL (ref 0.50–1.10)
GFR calc Af Amer: >90 mL/min (ref >90)
GFR, EST NON AFRICAN AMERICAN: 83 mL/min — AB (ref >90)
Glucose, Bld: 139 mg/dL — ABNORMAL HIGH (ref 70–99)
Potassium: 3.9 mEq/L (ref 3.7–5.3)
Sodium: 140 mEq/L (ref 137–147)
Total Bilirubin: 0.5 mg/dL (ref 0.3–1.2)
Total Protein: 7.7 g/dL (ref 6.0–8.3)

## 2014-04-16 LAB — CBC
HEMATOCRIT: 41.4 % (ref 36.0–46.0)
Hemoglobin: 13.7 g/dL (ref 12.0–15.0)
MCH: 31.6 pg (ref 26.0–34.0)
MCHC: 33.1 g/dL (ref 30.0–36.0)
MCV: 95.6 fL (ref 78.0–100.0)
PLATELETS: 180 10*3/uL (ref 150–400)
RBC: 4.33 MIL/uL (ref 3.87–5.11)
RDW: 13.1 % (ref 11.5–15.5)
WBC: 5.8 10*3/uL (ref 4.0–10.5)

## 2014-04-16 LAB — URINALYSIS, ROUTINE W REFLEX MICROSCOPIC
BILIRUBIN URINE: NEGATIVE
GLUCOSE, UA: NEGATIVE mg/dL
Ketones, ur: NEGATIVE mg/dL
Leukocytes, UA: NEGATIVE
Nitrite: NEGATIVE
Protein, ur: NEGATIVE mg/dL
SPECIFIC GRAVITY, URINE: 1.013 (ref 1.005–1.030)
UROBILINOGEN UA: 0.2 mg/dL (ref 0.0–1.0)
pH: 7.5 (ref 5.0–8.0)

## 2014-04-16 LAB — URINE MICROSCOPIC-ADD ON

## 2014-04-16 NOTE — Discharge Instructions (Signed)
Use fall precautions.  Ask for assistance as needed. Follow up with primary care doctor in the next 1-2 weeks. Return to ER if worse, new symptoms, severe pain, other concern.     Fall Prevention and Home Safety Falls cause injuries and can affect all age groups. It is possible to use preventive measures to significantly decrease the likelihood of falls. There are many simple measures which can make your home safer and prevent falls. OUTDOORS  Repair cracks and edges of walkways and driveways.  Remove high doorway thresholds.  Trim shrubbery on the main path into your home.  Have good outside lighting.  Clear walkways of tools, rocks, debris, and clutter.  Check that handrails are not broken and are securely fastened. Both sides of steps should have handrails.  Have leaves, snow, and ice cleared regularly.  Use sand or salt on walkways during winter months.  In the garage, clean up grease or oil spills. BATHROOM  Install night lights.  Install grab bars by the toilet and in the tub and shower.  Use non-skid mats or decals in the tub or shower.  Place a plastic non-slip stool in the shower to sit on, if needed.  Keep floors dry and clean up all water on the floor immediately.  Remove soap buildup in the tub or shower on a regular basis.  Secure bath mats with non-slip, double-sided rug tape.  Remove throw rugs and tripping hazards from the floors. BEDROOMS  Install night lights.  Make sure a bedside light is easy to reach.  Do not use oversized bedding.  Keep a telephone by your bedside.  Have a firm chair with side arms to use for getting dressed.  Remove throw rugs and tripping hazards from the floor. KITCHEN  Keep handles on pots and pans turned toward the center of the stove. Use back burners when possible.  Clean up spills quickly and allow time for drying.  Avoid walking on wet floors.  Avoid hot utensils and knives.  Position shelves so they  are not too high or low.  Place commonly used objects within easy reach.  If necessary, use a sturdy step stool with a grab bar when reaching.  Keep electrical cables out of the way.  Do not use floor polish or wax that makes floors slippery. If you must use wax, use non-skid floor wax.  Remove throw rugs and tripping hazards from the floor. STAIRWAYS  Never leave objects on stairs.  Place handrails on both sides of stairways and use them. Fix any loose handrails. Make sure handrails on both sides of the stairways are as long as the stairs.  Check carpeting to make sure it is firmly attached along stairs. Make repairs to worn or loose carpet promptly.  Avoid placing throw rugs at the top or bottom of stairways, or properly secure the rug with carpet tape to prevent slippage. Get rid of throw rugs, if possible.  Have an electrician put in a light switch at the top and bottom of the stairs. OTHER FALL PREVENTION TIPS  Wear low-heel or rubber-soled shoes that are supportive and fit well. Wear closed toe shoes.  When using a stepladder, make sure it is fully opened and both spreaders are firmly locked. Do not climb a closed stepladder.  Add color or contrast paint or tape to grab bars and handrails in your home. Place contrasting color strips on first and last steps.  Learn and use mobility aids as needed. Install an Lobbyist emergency  response system.  Turn on lights to avoid dark areas. Replace light bulbs that burn out immediately. Get light switches that glow.  Arrange furniture to create clear pathways. Keep furniture in the same place.  Firmly attach carpet with non-skid or double-sided tape.  Eliminate uneven floor surfaces.  Select a carpet pattern that does not visually hide the edge of steps.  Be aware of all pets. OTHER HOME SAFETY TIPS  Set the water temperature for 120 F (48.8 C).  Keep emergency numbers on or near the telephone.  Keep smoke detectors on  every level of the home and near sleeping areas. Document Released: 10/18/2002 Document Revised: 04/28/2012 Document Reviewed: 01/17/2012 [facility] Patient Information 2014 [place], [place].

## 2014-04-16 NOTE — ED Notes (Signed)
Denies headache

## 2014-04-16 NOTE — ED Notes (Signed)
Hooked pt up to monitor. Pt being monitored by pulse ox, blood pressure, and 12 lead.

## 2014-04-16 NOTE — ED Notes (Signed)
Patient transported to CT 

## 2014-04-16 NOTE — ED Notes (Signed)
Patient states that her head and neck are both sore but not from this fall, has fallen recently, unsure of exact date and hit her head that time.

## 2014-04-16 NOTE — ED Provider Notes (Signed)
CSN: [id number]     Arrival date & time 04/16/14  1011 History   First MD Initiated Contact with Patient 04/16/14 1024     Chief Complaint  Patient presents with  . Fall     (Consider location/radiation/quality/duration/timing/severity/associated sxs/prior Treatment) Patient is a 78 y.o. female presenting with fall. The history is provided by the patient.  Fall Pertinent negatives include no chest pain, no abdominal pain, no headaches and no shortness of breath.  pt with hx afib, copd on home o2, c/o fall just pta today. Was bending over to put scale underneath counter, when lost balance and fell. Did not hit head, no loc. No headache. Pt c/o neck pain. Dull, mild-mod, non radiating. No radicular pain. No back pain. Bumped elbow, but denies elbow pain or pain w rom. No numbness/weakness. Pt denies fever or chills. No chest pain or discomfort. No palpitations or sense of rapid or irregular heartbeat. No dysuria or gu c/o.      Past Medical History  Diagnosis Date  . Hypertrophic cardiomyopathy     a. 11/2013 Echo: EF 55-60%, basal inf HK, sev dil LA, no evidence of HCM.  PASP .  [name] Hypertension   . Situational mixed anxiety and depressive disorder   . Hypercholesterolemia   . Falls frequently     coumadin stopped  . Pulmonary HTN     a. has had prior right heart cath in 2010; was felt that most likely due to elevated left sided pressures and would not benefit from vasodilator therapy;  b. 11/2013 Echo: PASP .  [name] Oxygen dependent     "3L 24/7" (08/05/2013)  . COPD (chronic obstructive pulmonary disease)   . TIA (transient ischemic attack)   . Atrial fibrillation     a. Chronic; No longer on coumadin 2/2 frequent falls.  . CHF (congestive heart failure)   . Pacemaker     a. 04/2012 MDT [id number] [facility], ser #: [id number] H.  . Varicose veins   . History of pneumonia     "couple times; long time ago" (08/05/2013)  . Type II diabetes mellitus   . Anemia     "as a child"  (08/05/2013)  . GERD (gastroesophageal reflux disease)   . H/O hiatal hernia   . Osteoarthritis   . Arthritis     "all over me; in all my joints" (08/05/2013)  . Coccyx pain   . Gout     "right foot" (08/05/2013)  . Anxiety   . Depression   . Chest pain     a. 08/2011 Myoview: sm, fixed anteroseptal defect (attenuation), no ischemia, EF 62%.   Past Surgical History  Procedure Laterality Date  . Total knee arthroplasty Right 2011  . Tonsillectomy    . Appendectomy    . Abdominal hysterectomy      "partial" (08/05/2013)  . Dilation and curettage of uterus      "several" (08/05/2013)  . Foot neuroma surgery Right   . Insert / replace / remove pacemaker  1998; 2000's; 2014  . Cardiac catheterization      "more than once" (08/05/2013)  . Back surgery      "bone spur removed; mid back" (08/05/2013)   Family History  Problem Relation Age of Onset  . Other Father     died in plane crash  . Cancer Mother     lymphoma-NHL  . Coronary artery disease Grandchild   . Heart disease Maternal Grandmother    History  Substance Use Topics  .  Smoking status: Former Smoker -- 1.00 packs/day for 40 years    Types: Cigarettes    Quit date: 11/15/2005  . Smokeless tobacco: Never Used  . Alcohol Use: No   OB History   Grav Para Term Preterm Abortions TAB SAB Ect Mult Living                 Review of Systems  Constitutional: Negative for fever and chills.  HENT: Negative for nosebleeds.   Eyes: Negative for redness.  Respiratory: Negative for cough and shortness of breath.   Cardiovascular: Negative for chest pain, palpitations and leg swelling.  Gastrointestinal: Negative for vomiting, abdominal pain, diarrhea and blood in stool.  Genitourinary: Negative for dysuria and flank pain.  Musculoskeletal: Positive for neck pain. Negative for back pain.  Skin: Negative for wound.  Neurological: Negative for syncope, weakness, numbness and headaches.  Hematological: Does not bruise/bleed  easily.  Psychiatric/Behavioral: Negative for confusion.      Allergies  Aspirin; Calan; Crestor; Escitalopram oxalate; Lipitor; Lopressor; Nitroglycerin; Norvasc; Nsaids; Oxycodone; Prednisone; Sulfa antibiotics; Vimovo; and Penicillins  Home Medications   Prior to Admission medications   Medication Sig Start Date End Date Taking? Authorizing Provider  acetaminophen (TYLENOL) 500 MG tablet Take 500 mg by mouth every 6 (six) hours as needed for moderate pain.    Historical Provider, MD  budesonide-formoterol (SYMBICORT) 160-4.5 MCG/ACT inhaler Inhale 2 puffs into the lungs 2 (two) times daily.    Historical Provider, MD  buPROPion (WELLBUTRIN XL) 300 MG 24 hr tablet Take 300 mg by mouth daily.    Historical Provider, MD  calcium carbonate (TUMS - DOSED IN MG ELEMENTAL CALCIUM) 500 MG chewable tablet Chew 1 tablet by mouth 3 (three) times daily with meals. Only as needed    Historical Provider, MD  diltiazem (CARDIZEM CD) 240 MG 24 hr capsule Take 1 capsule (240 mg total) by mouth daily. 01/07/14   Cassell Clement, MD  feeding supplement, ENSURE, (ENSURE) PUDG Take 1 Container by mouth daily. 11/20/13   Leroy Sea, MD  fluticasone (FLONASE) 50 MCG/ACT nasal spray PLACE 2 SPRAYS INTO THE NOSE DAILY.    Kalman Shan, MD  furosemide (LASIX) 20 MG tablet Take 1 tablet (20 mg total) by mouth as needed. 04/08/14   Cassell Clement, MD  gabapentin (NEURONTIN) 100 MG capsule Take 100 mg by mouth 3 (three) times daily.    Historical Provider, MD  losartan (COZAAR) 50 MG tablet TAKE 1 TABLET (50 MG TOTAL) BY MOUTH DAILY. 01/31/14   Cassell Clement, MD  meclizine (ANTIVERT) 12.5 MG tablet Take 1 tablet (12.5 mg total) by mouth 3 (three) times daily as needed for dizziness. 11/20/13   Leroy Sea, MD  metFORMIN (GLUCOPHAGE) 500 MG tablet Take 500 mg by mouth 2 (two) times daily with a meal.    Historical Provider, MD  potassium chloride (KLOR-CON) 20 MEQ packet Take 20 mEq by mouth as needed.  Take with Lasix    Historical Provider, MD  sertraline (ZOLOFT) 100 MG tablet Take 200 mg by mouth daily.     Historical Provider, MD  temazepam (RESTORIL) 7.5 MG capsule Take 1 capsule (7.5 mg total) by mouth at bedtime as needed for sleep or anxiety. Insomnia 08/10/13   Ripudeep Jenna Luo, MD   BP 184/76  Pulse 79  Temp(Src) 98.1 F (36.7 C) (Oral)  Resp 13  Ht 5\' 4"  (1.626 m)  Wt 122 lb (55.339 kg)  BMI 20.93 kg/m2  SpO2 99% Physical Exam  Nursing note and vitals reviewed. Constitutional: She is oriented to person, place, and time. She appears well-developed and well-nourished. No distress.  HENT:  Head: Atraumatic.  Mouth/Throat: Oropharynx is clear and moist.  Eyes: Conjunctivae are normal. Pupils are equal, round, and reactive to light. No scleral icterus.  Neck: Neck supple. No tracheal deviation present.  Cardiovascular: Normal rate, normal heart sounds and intact distal pulses.  Exam reveals no gallop and no friction rub.   No murmur heard. Pulmonary/Chest: Effort normal and breath sounds normal. No respiratory distress. She exhibits no tenderness.  Abdominal: Soft. Normal appearance and bowel sounds are normal. She exhibits no distension. There is no tenderness.  No abd wall contusion or bruising.   Genitourinary:  No cva tenderness  Musculoskeletal: She exhibits no edema.  Mid cervical tenderness, otherwise, CTLS spine, non tender, aligned, no step off. Good rom bil ext. No pain or focal bony tenderness noted.   Neurological: She is alert and oriented to person, place, and time.  Motor intact bil. stre 5/5. sens intact.   Skin: Skin is warm and dry. No rash noted.  Psychiatric: She has a normal mood and affect.    ED Course  Procedures (including critical care time) Labs Review  Results for orders placed during the hospital encounter of 04/16/14  URINALYSIS, ROUTINE W REFLEX MICROSCOPIC      Result Value Ref Range   Color, Urine YELLOW  YELLOW   APPearance CLOUDY  (*) CLEAR   Specific Gravity, Urine 1.013  1.005 - 1.030   pH 7.5  5.0 - 8.0   Glucose, UA NEGATIVE  NEGATIVE mg/dL   Hgb urine dipstick TRACE (*) NEGATIVE   Bilirubin Urine NEGATIVE  NEGATIVE   Ketones, ur NEGATIVE  NEGATIVE mg/dL   Protein, ur NEGATIVE  NEGATIVE mg/dL   Urobilinogen, UA 0.2  0.0 - 1.0 mg/dL   Nitrite NEGATIVE  NEGATIVE   Leukocytes, UA NEGATIVE  NEGATIVE  CBC      Result Value Ref Range   WBC 5.8  4.0 - 10.5 K/uL   RBC 4.33  3.87 - 5.11 MIL/uL   Hemoglobin 13.7  12.0 - 15.0 g/dL   HCT 78.4  69.6 - 29.5 %   MCV 95.6  78.0 - 100.0 fL   MCH 31.6  26.0 - 34.0 pg   MCHC 33.1  30.0 - 36.0 g/dL   RDW 28.4  13.2 - 44.0 %   Platelets 180  150 - 400 K/uL  COMPREHENSIVE METABOLIC PANEL      Result Value Ref Range   Sodium 140  137 - 147 mEq/L   Potassium 3.9  3.7 - 5.3 mEq/L   Chloride 99  96 - 112 mEq/L   CO2 29  19 - 32 mEq/L   Glucose, Bld 139 (*) 70 - 99 mg/dL   BUN 11  6 - 23 mg/dL   Creatinine, Ser 1.02  0.50 - 1.10 mg/dL   Calcium 72.5  8.4 - 36.6 mg/dL   Total Protein 7.7  6.0 - 8.3 g/dL   Albumin 4.2  3.5 - 5.2 g/dL   AST 25  0 - 37 U/L   ALT 11  0 - 35 U/L   Alkaline Phosphatase 98  39 - 117 U/L   Total Bilirubin 0.5  0.3 - 1.2 mg/dL   GFR calc non Af Amer 83 (*) >90 mL/min   GFR calc Af Amer >90  >90 mL/min  URINE MICROSCOPIC-ADD ON      Result Value  Ref Range   RBC / HPF 0-2  <3 RBC/hpf   Bacteria, UA MANY (*) RARE   Ct Head Wo Contrast  04/16/2014   CLINICAL DATA:  Status post fall.  Headache and neck pain.  EXAM: CT HEAD WITHOUT CONTRAST  CT CERVICAL SPINE WITHOUT CONTRAST  TECHNIQUE: Multidetector CT imaging of the head and cervical spine was performed following the standard protocol without intravenous contrast. Multiplanar CT image reconstructions of the cervical spine were also generated.  COMPARISON:  Head CT scan 11/19/2013. Head and cervical spine CT scan 11/17/2013.  FINDINGS: CT HEAD FINDINGS  There is cortical atrophy and chronic  microvascular ischemic change. Punctate calcification and high right parietal lobe is unchanged. There is no evidence of acute intracranial abnormality including infarct, hemorrhage, mass lesion, mass effect, midline shift or abnormal extra-axial fluid collection. No pneumocephalus or hydrocephalus. The calvarium is intact. Imaged paranasal sinuses and mastoid air cells are clear. Atherosclerosis is noted.  CT CERVICAL SPINE FINDINGS  No fracture or malalignment of the cervical spine is identified. Degenerative disc disease appearing worst at C5-6 and C6-7 is seen. Multilevel advanced facet arthropathy is noted. Lung apices are clear.  IMPRESSION: No acute finding head or cervical spine. Stable compared to prior exams.   Electronically Signed   By: Drusilla Kannerhomas  Dalessio M.D.   On: 04/16/2014 11:36   Ct Cervical Spine Wo Contrast  04/16/2014   CLINICAL DATA:  Status post fall.  Headache and neck pain.  EXAM: CT HEAD WITHOUT CONTRAST  CT CERVICAL SPINE WITHOUT CONTRAST  TECHNIQUE: Multidetector CT imaging of the head and cervical spine was performed following the standard protocol without intravenous contrast. Multiplanar CT image reconstructions of the cervical spine were also generated.  COMPARISON:  Head CT scan 11/19/2013. Head and cervical spine CT scan 11/17/2013.  FINDINGS: CT HEAD FINDINGS  There is cortical atrophy and chronic microvascular ischemic change. Punctate calcification and high right parietal lobe is unchanged. There is no evidence of acute intracranial abnormality including infarct, hemorrhage, mass lesion, mass effect, midline shift or abnormal extra-axial fluid collection. No pneumocephalus or hydrocephalus. The calvarium is intact. Imaged paranasal sinuses and mastoid air cells are clear. Atherosclerosis is noted.  CT CERVICAL SPINE FINDINGS  No fracture or malalignment of the cervical spine is identified. Degenerative disc disease appearing worst at C5-6 and C6-7 is seen. Multilevel advanced  facet arthropathy is noted. Lung apices are clear.  IMPRESSION: No acute finding head or cervical spine. Stable compared to prior exams.   Electronically Signed   By: Drusilla Kannerhomas  Dalessio M.D.   On: 04/16/2014 11:36        EKG Interpretation   Date/Time:  Saturday April 16 2014 10:27:56 EDT Ventricular Rate:  73 PR Interval:    QRS Duration: 99 QT Interval:  400 QTC Calculation: 441 R Axis:   73 Text Interpretation:  Atrial fibrillation Probable LVH with secondary  repol abnrm Nonspecific T wave abnormality No significant change since  last tracing Confirmed by Deamonte Sayegh  MD, Capitola Ladson ([id number]) on 04/16/2014 10:37:52  AM      MDM   Iv ns. Labs.  Ct.  Reviewed nursing notes and prior charts for additional history.   Recheck awake and alert. No new c/o. Comfortable. No pain.  Spine nt.  Pt appears stable for d/c.     Suzi RootsKevin E Akio Hudnall, MD 04/16/14 [phone]

## 2014-04-16 NOTE — ED Notes (Signed)
Dr. Steinl at bedside 

## 2014-04-16 NOTE — ED Notes (Addendum)
Patient from [facility], Per EMS: fell, lost balance while placing a scale under the counter, small skin tear to right elbow, c-spine cleared at scene, denies hitting head and denies head/neck pain, not on blood thinners, denies LOC, hit carpet and partially landed on tile, daughter might be en route, hx of DM, COPD, HTN, cardiomyopathy, pace maker (placed in 1991), currently in afib.  States she falls frequently because of her balance, has had this checked by PCP and no cause has been identified per patient.  Denies pain other than elbow, which has small skin tear that is not currently bleeding.

## 2014-04-19 ENCOUNTER — Encounter (HOSPITAL_COMMUNITY): Payer: Self-pay | Admitting: Emergency Medicine

## 2014-04-19 ENCOUNTER — Emergency Department (HOSPITAL_COMMUNITY): Payer: Medicare Other

## 2014-04-19 ENCOUNTER — Emergency Department (HOSPITAL_COMMUNITY)
Admission: EM | Admit: 2014-04-19 | Discharge: 2014-04-19 | Disposition: A | Discharge status: Home / Self Care | Payer: Medicare Other | Attending: Emergency Medicine | Admitting: Emergency Medicine

## 2014-04-19 DIAGNOSIS — S0003XA Contusion of scalp, initial encounter: Secondary | ICD-10-CM | POA: Insufficient documentation

## 2014-04-19 DIAGNOSIS — S1093XA Contusion of unspecified part of neck, initial encounter: Secondary | ICD-10-CM

## 2014-04-19 DIAGNOSIS — I10 Essential (primary) hypertension: Secondary | ICD-10-CM | POA: Insufficient documentation

## 2014-04-19 DIAGNOSIS — Z880 Allergy status to penicillin: Secondary | ICD-10-CM | POA: Insufficient documentation

## 2014-04-19 DIAGNOSIS — E7800 Pure hypercholesterolemia, unspecified: Secondary | ICD-10-CM | POA: Insufficient documentation

## 2014-04-19 DIAGNOSIS — Z950 Presence of cardiac pacemaker: Secondary | ICD-10-CM | POA: Insufficient documentation

## 2014-04-19 DIAGNOSIS — F4323 Adjustment disorder with mixed anxiety and depressed mood: Secondary | ICD-10-CM | POA: Insufficient documentation

## 2014-04-19 DIAGNOSIS — IMO0002 Reserved for concepts with insufficient information to code with codable children: Secondary | ICD-10-CM | POA: Insufficient documentation

## 2014-04-19 DIAGNOSIS — M1990 Unspecified osteoarthritis, unspecified site: Secondary | ICD-10-CM | POA: Insufficient documentation

## 2014-04-19 DIAGNOSIS — Z9981 Dependence on supplemental oxygen: Secondary | ICD-10-CM | POA: Insufficient documentation

## 2014-04-19 DIAGNOSIS — K219 Gastro-esophageal reflux disease without esophagitis: Secondary | ICD-10-CM | POA: Insufficient documentation

## 2014-04-19 DIAGNOSIS — Y9389 Activity, other specified: Secondary | ICD-10-CM | POA: Insufficient documentation

## 2014-04-19 DIAGNOSIS — I4891 Unspecified atrial fibrillation: Secondary | ICD-10-CM | POA: Insufficient documentation

## 2014-04-19 DIAGNOSIS — Y9210 Unspecified residential institution as the place of occurrence of the external cause: Secondary | ICD-10-CM | POA: Insufficient documentation

## 2014-04-19 DIAGNOSIS — J4489 Other specified chronic obstructive pulmonary disease: Secondary | ICD-10-CM | POA: Insufficient documentation

## 2014-04-19 DIAGNOSIS — E119 Type 2 diabetes mellitus without complications: Secondary | ICD-10-CM | POA: Insufficient documentation

## 2014-04-19 DIAGNOSIS — S8010XA Contusion of unspecified lower leg, initial encounter: Secondary | ICD-10-CM | POA: Insufficient documentation

## 2014-04-19 DIAGNOSIS — J449 Chronic obstructive pulmonary disease, unspecified: Secondary | ICD-10-CM | POA: Insufficient documentation

## 2014-04-19 DIAGNOSIS — Z8701 Personal history of pneumonia (recurrent): Secondary | ICD-10-CM | POA: Insufficient documentation

## 2014-04-19 DIAGNOSIS — I509 Heart failure, unspecified: Secondary | ICD-10-CM | POA: Insufficient documentation

## 2014-04-19 DIAGNOSIS — R296 Repeated falls: Secondary | ICD-10-CM | POA: Insufficient documentation

## 2014-04-19 DIAGNOSIS — D649 Anemia, unspecified: Secondary | ICD-10-CM | POA: Insufficient documentation

## 2014-04-19 DIAGNOSIS — S0990XA Unspecified injury of head, initial encounter: Secondary | ICD-10-CM

## 2014-04-19 DIAGNOSIS — S0083XA Contusion of other part of head, initial encounter: Secondary | ICD-10-CM | POA: Insufficient documentation

## 2014-04-19 DIAGNOSIS — Z79899 Other long term (current) drug therapy: Secondary | ICD-10-CM | POA: Insufficient documentation

## 2014-04-19 DIAGNOSIS — Z8673 Personal history of transient ischemic attack (TIA), and cerebral infarction without residual deficits: Secondary | ICD-10-CM | POA: Insufficient documentation

## 2014-04-19 DIAGNOSIS — Z87891 Personal history of nicotine dependence: Secondary | ICD-10-CM | POA: Insufficient documentation

## 2014-04-19 NOTE — ED Notes (Signed)
Pt has been dressed and is ready for transport. Family continues to be at bedside.

## 2014-04-19 NOTE — ED Notes (Signed)
[facility] contacted to transport pt to [facility]. Discharge instructions reviewed with Shelia/RN at [facility]. Silvio Pate denies questions regarding instruction.

## 2014-04-19 NOTE — ED Provider Notes (Signed)
CSN: [id number]     Arrival date & time 04/19/14  1618 History   First MD Initiated Contact with Patient 04/19/14 1622     Chief Complaint  Patient presents with  . Leg Pain     (Consider location/radiation/quality/duration/timing/severity/associated sxs/prior Treatment) HPI  78 year old female with pain in her right shin after she had mechanical fall. She also struck her head. There is no loss of consciousness. Minimal pain at the area she hit. No acute visual changes. No acute numbness, tingling or loss of strength. No blood thinners. No confusion per daughter. No nausea or vomiting.  Past Medical History  Diagnosis Date  . Hypertrophic cardiomyopathy     a. 11/2013 Echo: EF 55-60%, basal inf HK, sev dil LA, no evidence of HCM.  PASP .  [name] Hypertension   . Situational mixed anxiety and depressive disorder   . Hypercholesterolemia   . Falls frequently     coumadin stopped  . Pulmonary HTN     a. has had prior right heart cath in 2010; was felt that most likely due to elevated left sided pressures and would not benefit from vasodilator therapy;  b. 11/2013 Echo: PASP .  [name] Oxygen dependent     "3L 24/7" (08/05/2013)  . COPD (chronic obstructive pulmonary disease)   . TIA (transient ischemic attack)   . Atrial fibrillation     a. Chronic; No longer on coumadin 2/2 frequent falls.  . CHF (congestive heart failure)   . Pacemaker     a. 04/2012 MDT [id number] [employer] PPM, ser #: [id number] H.  . Varicose veins   . History of pneumonia     "couple times; long time ago" (08/05/2013)  . Type II diabetes mellitus   . Anemia     "as a child" (08/05/2013)  . GERD (gastroesophageal reflux disease)   . H/O hiatal hernia   . Osteoarthritis   . Arthritis     "all over me; in all my joints" (08/05/2013)  . Coccyx pain   . Gout     "right foot" (08/05/2013)  . Anxiety   . Depression   . Chest pain     a. 08/2011 Myoview: sm, fixed anteroseptal defect (attenuation), no ischemia, EF  62%.   Past Surgical History  Procedure Laterality Date  . Total knee arthroplasty Right 2011  . Tonsillectomy    . Appendectomy    . Abdominal hysterectomy      "partial" (08/05/2013)  . Dilation and curettage of uterus      "several" (08/05/2013)  . Foot neuroma surgery Right   . Insert / replace / remove pacemaker  1998; 2000's; 2014  . Cardiac catheterization      "more than once" (08/05/2013)  . Back surgery      "bone spur removed; mid back" (08/05/2013)   Family History  Problem Relation Age of Onset  . Other Father     died in plane crash  . Cancer Mother     lymphoma-NHL  . Coronary artery disease Grandchild   . Heart disease Maternal Grandmother    History  Substance Use Topics  . Smoking status: Former Smoker -- 1.00 packs/day for 40 years    Types: Cigarettes    Quit date: 11/15/2005  . Smokeless tobacco: Never Used  . Alcohol Use: No   OB History   Grav Para Term Preterm Abortions TAB SAB Ect Mult Living  Review of Systems  All systems reviewed and negative, other than as noted in HPI.   Allergies  Aspirin; Calan; Crestor; Escitalopram oxalate; Lipitor; Lopressor; Nitroglycerin; Norvasc; Nsaids; Oxycodone; Prednisone; Sulfa antibiotics; Vimovo; and Penicillins  Home Medications   Prior to Admission medications   Medication Sig Start Date End Date Taking? Authorizing Provider  acetaminophen (TYLENOL) 500 MG tablet Take 500 mg by mouth every 6 (six) hours as needed for moderate pain.   Yes Historical Provider, MD  albuterol (PROVENTIL HFA;VENTOLIN HFA) 108 (90 BASE) MCG/ACT inhaler Inhale 2 puffs into the lungs every 4 (four) hours as needed (cough).   Yes Historical Provider, MD  budesonide-formoterol (SYMBICORT) 160-4.5 MCG/ACT inhaler Inhale 2 puffs into the lungs 2 (two) times daily.   Yes Historical Provider, MD  buPROPion (WELLBUTRIN XL) 150 MG 24 hr tablet Take 150 mg by mouth daily.   Yes Historical Provider, MD  calcium carbonate  (TUMS - DOSED IN MG ELEMENTAL CALCIUM) 500 MG chewable tablet Chew 1 tablet by mouth 4 (four) times daily as needed for indigestion. Only as needed   Yes Historical Provider, MD  diltiazem (CARDIZEM CD) 240 MG 24 hr capsule Take 1 capsule (240 mg total) by mouth daily. 01/07/14  Yes Cassell Clementhomas Brackbill, MD  famotidine (PEPCID) 20 MG tablet Take 20 mg by mouth daily as needed for indigestion.   Yes Historical Provider, MD  fluticasone (FLONASE) 50 MCG/ACT nasal spray Place 1 spray into both nostrils daily as needed for allergies.    Yes Historical Provider, MD  furosemide (LASIX) 20 MG tablet Take 20 mg by mouth daily as needed (swelling).   Yes Historical Provider, MD  gabapentin (NEURONTIN) 300 MG capsule Take 300 mg by mouth 3 (three) times daily.   Yes Historical Provider, MD  HYDROcodone-acetaminophen (NORCO/VICODIN) 5-325 MG per tablet Take 1 tablet by mouth every 4 (four) hours as needed for moderate pain.   Yes Historical Provider, MD  loperamide (IMODIUM) 2 MG capsule Take 2 mg by mouth as needed for diarrhea or loose stools.   Yes Historical Provider, MD  LORazepam (ATIVAN) 1 MG tablet Take 1 mg by mouth daily as needed for anxiety.   Yes Historical Provider, MD  losartan (COZAAR) 50 MG tablet Take 50 mg by mouth daily.   Yes Historical Provider, MD  metFORMIN (GLUCOPHAGE) 500 MG tablet Take 500 mg by mouth 2 (two) times daily with a meal.   Yes Historical Provider, MD  Multiple Vitamins-Minerals (MULTIVITAMIN WITH MINERALS) tablet Take 1 tablet by mouth daily.   Yes Historical Provider, MD  Phenazopyridine HCl (AZO TABS PO) Take 1 tablet by mouth as needed (bladder pain). "as directed on box"   Yes Historical Provider, MD  potassium chloride (KLOR-CON) 20 MEQ packet Take 20 mEq by mouth as needed. Take with Lasix   Yes Historical Provider, MD  sertraline (ZOLOFT) 100 MG tablet Take 200 mg by mouth daily.    Yes Historical Provider, MD  temazepam (RESTORIL) 15 MG capsule Take 15 mg by mouth at  bedtime as needed for sleep.   Yes Historical Provider, MD  vitamin B-12 (CYANOCOBALAMIN) 1000 MCG tablet Take 1,000 mcg by mouth daily.   Yes Historical Provider, MD   BP 147/78  Pulse 97  Temp(Src) 98.3 F (36.8 C) (Oral)  Resp 17  SpO2 100% Physical Exam  Nursing note and vitals reviewed. Constitutional: She appears well-developed and well-nourished. No distress.  HENT:  Head: Normocephalic and atraumatic.  Eyes: Conjunctivae are normal. Right eye exhibits no  discharge. Left eye exhibits no discharge.  Neck: Neck supple.  Cardiovascular: Normal rate, regular rhythm and normal heart sounds.  Exam reveals no gallop and no friction rub.   No murmur heard. Pulmonary/Chest: Effort normal and breath sounds normal. No respiratory distress.  Abdominal: Soft. She exhibits no distension. There is no tenderness.  Musculoskeletal: She exhibits tenderness. She exhibits no edema.  Some mild tenderness to the mid right tibia. Small area of ecchymosis. Neurovascular intact distally. No significant pain range of motion the right knee her tenderness to palpation. No effusion.  Neurological: She is alert.  Skin: Skin is warm and dry.  Psychiatric: She has a normal mood and affect. Her behavior is normal. Thought content normal.    ED Course  Procedures (including critical care time) Labs Review Labs Reviewed - No data to display  Imaging Review Dg Tibia/fibula Right  04/19/2014   CLINICAL DATA:  Status post fall with mid tibia and fibular region bruising  EXAM: RIGHT TIBIA AND FIBULA - 2 VIEW  COMPARISON:  Right knee series of December 21, 2011  FINDINGS: The shafts of the right tibia and fibula are intact. The interface of the native bone with the tibial component of the knee prosthesis is normal. The ankle joint mortise is preserved. The overlying soft tissues of the knee exhibit no foreign bodies. Minimal soft tissue swelling anteriorly over the proximal shin is present.  IMPRESSION: There is no  acute bony abnormality of the right tibia or fibula.   Electronically Signed   By: David  [place]   On: 04/19/2014 17:06   Ct Head Wo Contrast  04/19/2014   CLINICAL DATA:  Fall  EXAM: CT HEAD WITHOUT CONTRAST  TECHNIQUE: Contiguous axial images were obtained from the base of the skull through the vertex without intravenous contrast.  COMPARISON:  04/16/2014  FINDINGS: No skull fracture is noted. Paranasal sinuses and mastoid air cells are unremarkable.  Stable cerebral atrophy. Stable periventricular and patchy subcortical chronic white matter disease. No acute cortical infarction. No mass lesion is noted on this unenhanced scan. No intraventricular hemorrhage. No intracranial hemorrhage, mass effect or midline shift.  IMPRESSION: No acute intracranial abnormality. Stable atrophy and chronic white matter disease. No definite acute cortical infarction.   Electronically Signed   By: Natasha Mead M.D.   On: 04/19/2014 17:11     EKG Interpretation None      MDM   Final diagnoses:  Closed head injury  Contusion of leg    78 year old female with leg pain and scalp contusion after a fall. Nonfocal neurological examination. Imaging unremarkable. Patient is at her baseline per her daughter at bedside. Low suspicion for emergent traumatic injury. Patient with multiple recent falls. She is on Ativan for anxiety. Her daughter reports that many of her falls or after she takes this. Recommended trying to take this prior to going to bed or decreasing her dose by half as it may be contributing to her imbalance.    Raeford Razor, MD 04/28/14 [phone]

## 2014-04-19 NOTE — ED Notes (Signed)
Bed: RJ18 Expected date:  Expected time:  Means of arrival:  Comments: EMS-fall-hematoma

## 2014-04-19 NOTE — Discharge Instructions (Signed)
Contusion °A contusion is a deep bruise. Contusions are the result of an injury that caused bleeding under the skin. The contusion may turn blue, purple, or yellow. Minor injuries will give you a painless contusion, but more severe contusions may stay painful and swollen for a few weeks.  °CAUSES  °A contusion is usually caused by a blow, trauma, or direct force to an area of the body. °SYMPTOMS  °· Swelling and redness of the injured area. °· Bruising of the injured area. °· Tenderness and soreness of the injured area. °· Pain. °DIAGNOSIS  °The diagnosis can be made by taking a history and physical exam. An X-ray, CT scan, or MRI may be needed to determine if there were any associated injuries, such as fractures. °TREATMENT  °Specific treatment will depend on what area of the body was injured. In general, the best treatment for a contusion is resting, icing, elevating, and applying cold compresses to the injured area. Over-the-counter medicines may also be recommended for pain control. Ask your caregiver what the best treatment is for your contusion. °HOME CARE INSTRUCTIONS  °· Put ice on the injured area. °· Put ice in a plastic bag. °· Place a towel between your skin and the bag. °· Leave the ice on for 15-20 minutes, 03-04 times a day. °· Only take over-the-counter or prescription medicines for pain, discomfort, or fever as directed by your caregiver. Your caregiver may recommend avoiding anti-inflammatory medicines (aspirin, ibuprofen, and naproxen) for 48 hours because these medicines may increase bruising. °· Rest the injured area. °· If possible, elevate the injured area to reduce swelling. °SEEK IMMEDIATE MEDICAL CARE IF:  °· You have increased bruising or swelling. °· You have pain that is getting worse. °· Your swelling or pain is not relieved with medicines. °MAKE SURE YOU:  °· Understand these instructions. °· Will watch your condition. °· Will get help right away if you are not doing well or get  worse. °Document Released: 08/07/2005 Document Revised: 01/20/2012 Document Reviewed: 09/02/2011 °ExitCare® Patient Information ©2014 [employer]. °Head Injury, Adult °You have received a head injury. It does not appear serious at this time. Headaches and vomiting are common following head injury. It should be easy to awaken from sleeping. Sometimes it is necessary for you to stay in the emergency department for a while for observation. Sometimes admission to the hospital may be needed. After injuries such as yours, most problems occur within the first 24 hours, but side effects may occur up to 7 10 days after the injury. It is important for you to carefully monitor your condition and contact your health care provider or seek immediate medical care if there is a change in your condition. °WHAT ARE THE TYPES OF HEAD INJURIES? °Head injuries can be as minor as a bump. Some head injuries can be more severe. More severe head injuries include: °· A jarring injury to the brain (concussion). °· A bruise of the brain (contusion). This mean there is bleeding in the brain that can cause swelling. °· A cracked skull (skull fracture). °· Bleeding in the brain that collects, clots, and forms a bump (hematoma). °WHAT CAUSES A HEAD INJURY? °A serious head injury is most likely to happen to someone who is in a car wreck and is not wearing a seat belt. Other causes of major head injuries include bicycle or motorcycle accidents, sports injuries, and falls. °HOW ARE HEAD INJURIES DIAGNOSED? °A complete history of the event leading to the injury and your current   symptoms will be helpful in diagnosing head injuries. Many times, pictures of the brain, such as CT or MRI are needed to see the extent of the injury. Often, an overnight hospital stay is necessary for observation.  °WHEN SHOULD I SEEK IMMEDIATE MEDICAL CARE?  °You should get help right away if: °· You have confusion or drowsiness. °· You feel sick to your stomach (nauseous)  or have continued, forceful vomiting. °· You have dizziness or unsteadiness that is getting worse. °· You have severe, continued headaches not relieved by medicine. Only take over-the-counter or prescription medicines for pain, fever, or discomfort as directed by your health care provider. °· You do not have normal function of the arms or legs or are unable to walk. °· You notice changes in the black spots in the center of the colored part of your eye (pupil). °· You have a clear or bloody fluid coming from your nose or ears. °· You have a loss of vision. °During the next 24 hours after the injury, you must stay with someone who can watch you for the warning signs. This person should contact local emergency services (911 in the [place] if you have seizures, you become unconscious, or you are unable to wake up. °HOW CAN I PREVENT A HEAD INJURY IN THE FUTURE? °The most important factor for preventing major head injuries is avoiding motor vehicle accidents.  To minimize the potential for damage to your head, it is crucial to wear seat belts while riding in motor vehicles. Wearing helmets while bike riding and playing collision sports (like football) is also helpful. Also, avoiding dangerous activities around the house will further help reduce your risk of head injury.  °WHEN CAN I RETURN TO NORMAL ACTIVITIES AND ATHLETICS? °You should be reevaluated by your health care provider before returning to these activities. If you have any of the following symptoms, you should not return to activities or contact sports until 1 week after the symptoms have stopped: °· Persistent headache. °· Dizziness or vertigo. °· Poor attention and concentration. °· Confusion. °· Memory problems. °· Nausea or vomiting. °· Fatigue or tire easily. °· Irritability. °· Intolerant of bright lights or loud noises. °· Anxiety or depression. °· Disturbed sleep. °MAKE SURE YOU:  °· Understand these instructions. °· Will watch your condition. °· Will get  help right away if you are not doing well or get worse. °Document Released: 10/28/2005 Document Revised: 08/18/2013 Document Reviewed: 07/05/2013 °ExitCare® Patient Information ©2014 [employer]. ° °

## 2014-04-19 NOTE — ED Notes (Signed)
Pt placed on cardiac monitoring.  

## 2014-04-19 NOTE — ED Notes (Addendum)
Pt denies need/want for pain medication at present time. Pt reports will alert staff if would like something for pain.   Pt has raised bruising to RLE. Pt has quarter sized hematoma to left occipital head. Pt denies LOC.  Kohut MD at bedside.

## 2014-04-19 NOTE — ED Notes (Addendum)
Per EMS pt from [facility] at [facility] resulting in right leg pain and hematoma to left occipital area. Pt a/o x4. Pt given 100 mcg en route with EMS for pain. IV removed/came out prior to arrival to ED. Leg splint applied to right leg en route with EMS.

## 2014-04-26 IMAGING — CT CT HEAD W/O CM
2 of 4 series · 15 of 37 positions shown, 18 images · non-contrast
Comparison: CT head dated 02/18/2012

CT HEAD

CLINICAL DATA: Fall, left anterior face pain, headaches

CT HEAD WITHOUT CONTRAST
CT MAXILLOFACIAL WITHOUT CONTRAST
TECHNIQUE: Multidetector CT imaging of the head and maxillofacial
structures were performed using the standard protocol without
intravenous contrast. Multiplanar CT image reconstructions of the
maxillofacial structures were also generated.

[Series 4: facial st · axial · 0.29mm/px · z∈[-214,-76]mm · 12 of 81 slices shown, 15 images]
[im 6/81  brain]
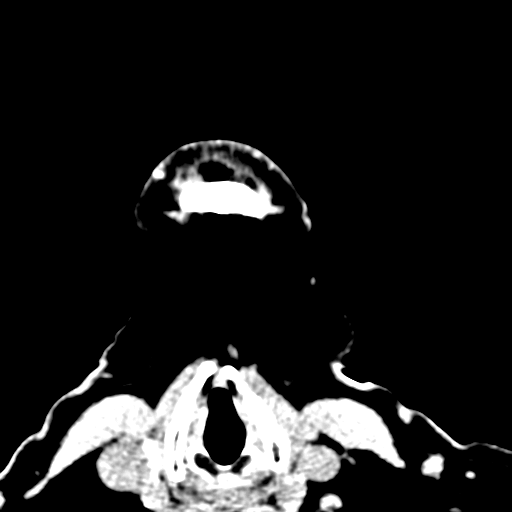
[im 6/81  bone]
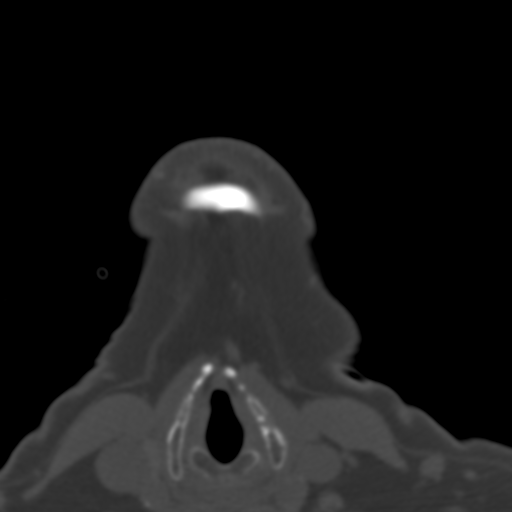
[im 12/81  brain]
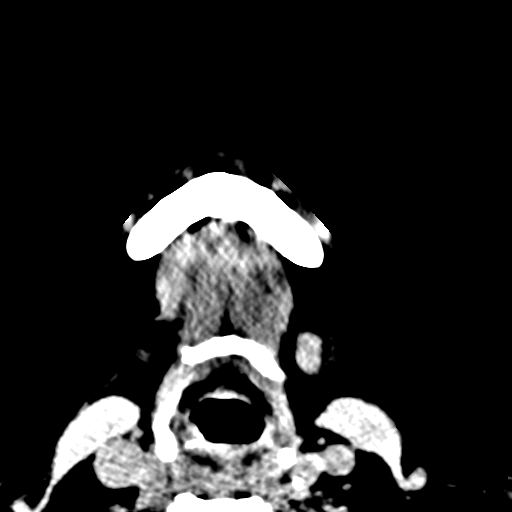
[im 18/81  brain]
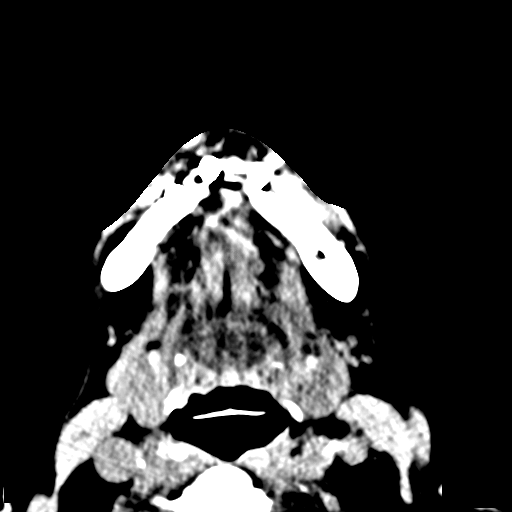
[im 23/81  brain]
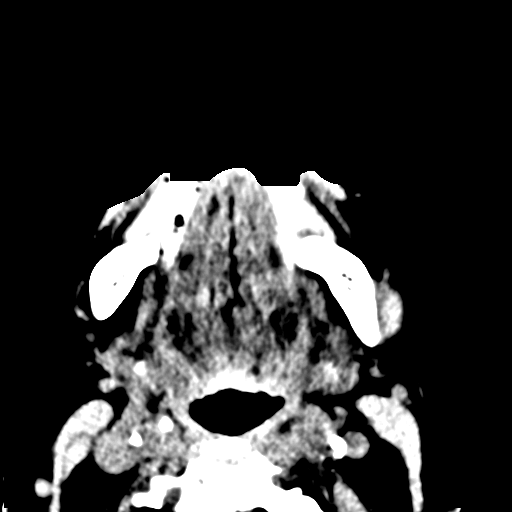
[im 29/81  brain]
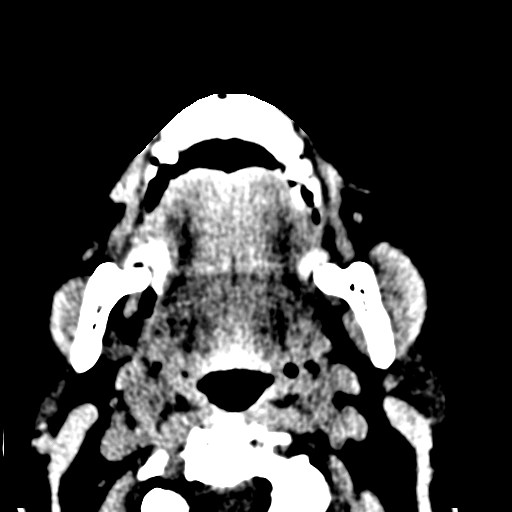
[im 29/81  bone]
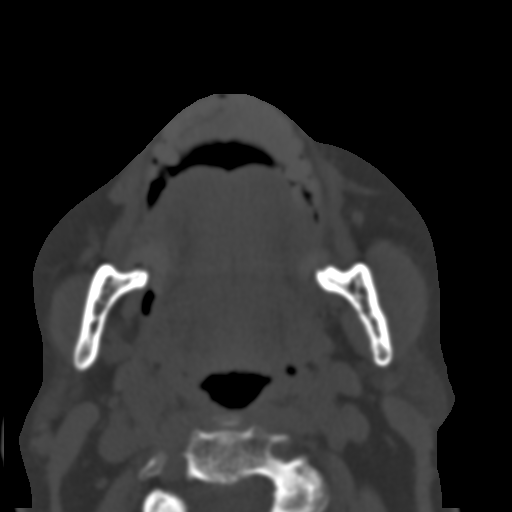
[im 35/81  brain]
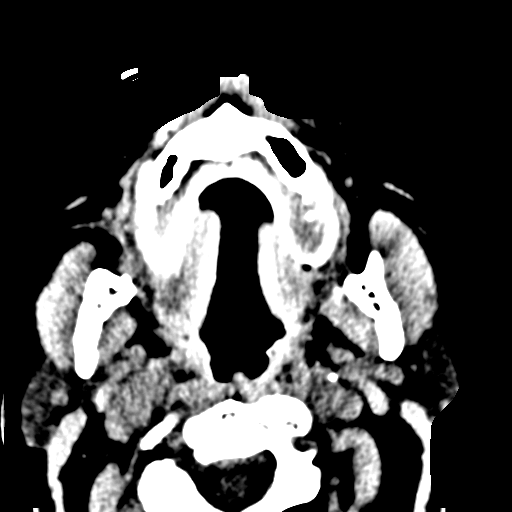
[im 46/81  brain]
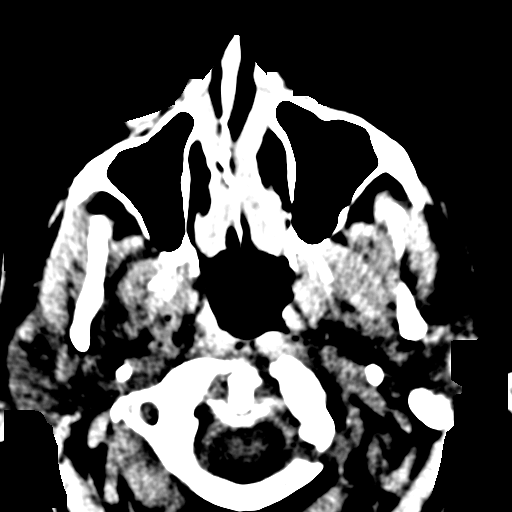
[im 52/81  brain]
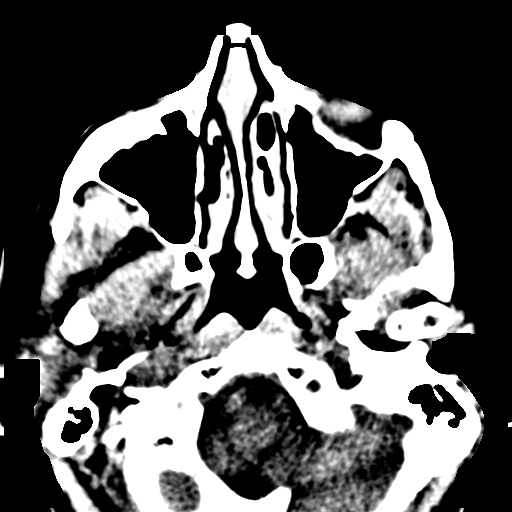
[im 58/81  brain]
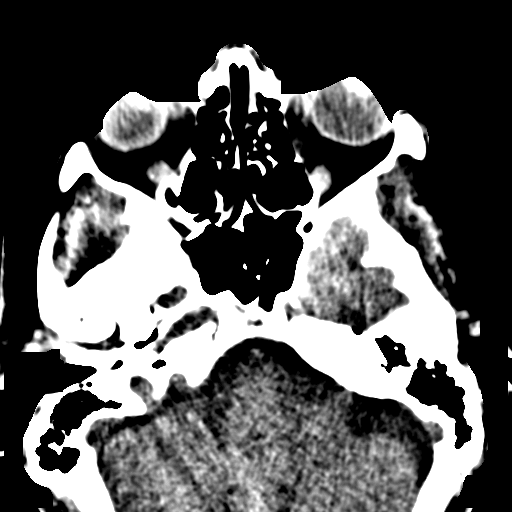
[im 58/81  bone]
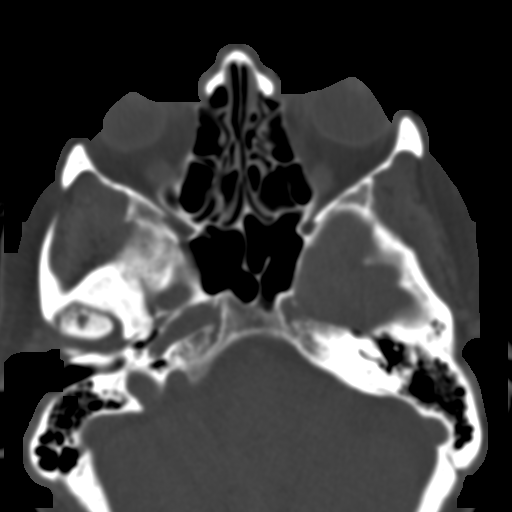
[im 63/81  brain]
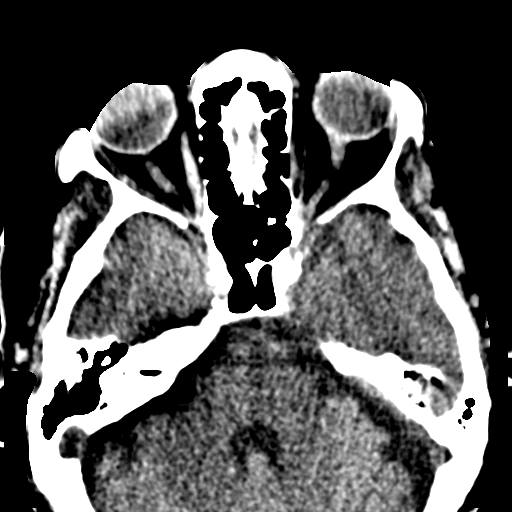
[im 69/81  brain]
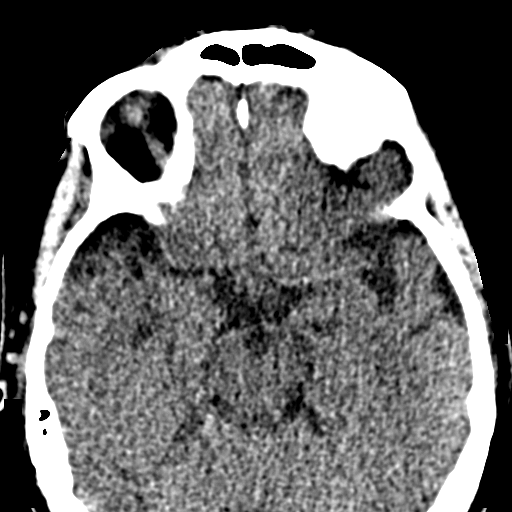
[im 75/81  brain]
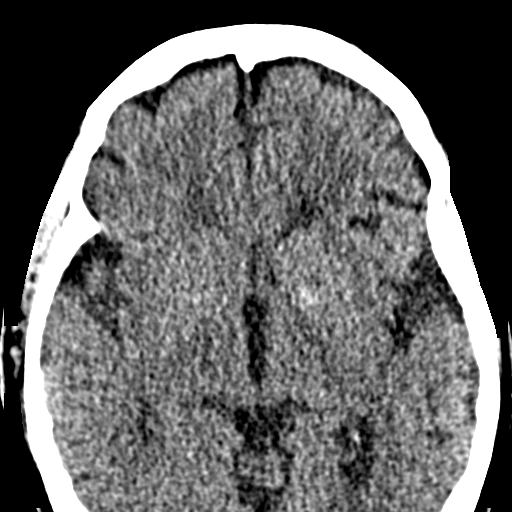

[Series 602: <mpr thick range> · sagittal · 0.32mm/px · 3 of 73 slices shown]
[im 25/73  brain]
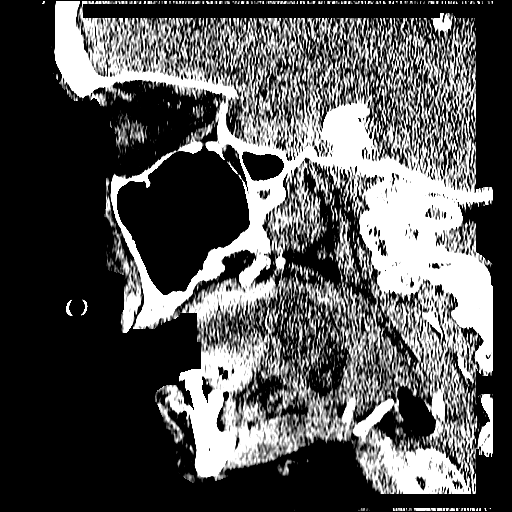
[im 37/73  brain]
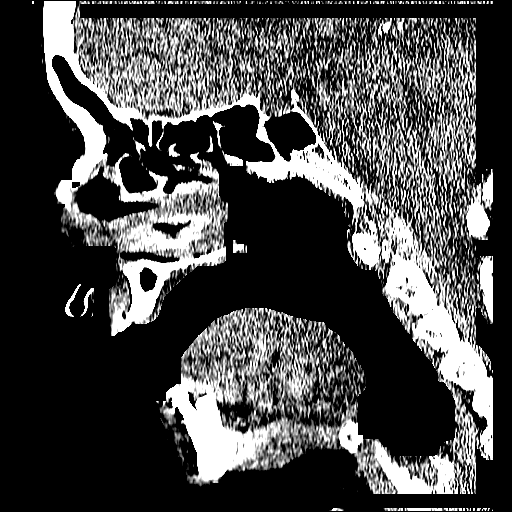
[im 49/73  brain]
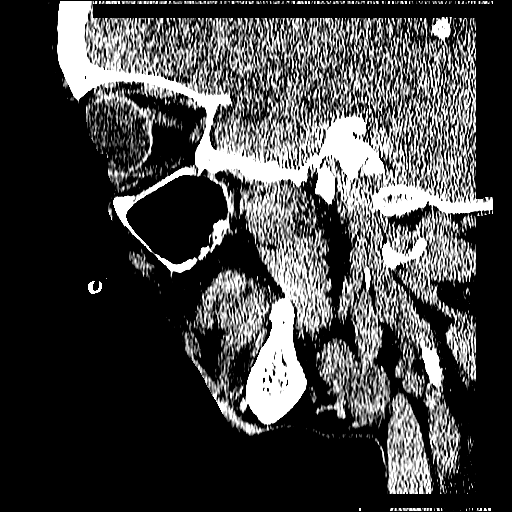

[15 of 37 positions shown; findings below may reference images not displayed]

FINDINGS: No evidence of parenchymal hemorrhage or extra-axial
fluid collection. No mass lesion, mass effect, or midline shift.

No CT evidence of acute infarction.

Subcortical white matter and periventricular small vessel ischemic
changes.  Intracranial atherosclerosis.

Mild global cortical atrophy.  No ventriculomegaly.

The visualized paranasal sinuses are essentially clear. The mastoid
air cells are unopacified.

No evidence of calvarial fracture.
IMPRESSION: No evidence of acute intracranial abnormality.

Atrophy with mild small vessel ischemic changes.

CT MAXILLOFACIAL
FINDINGS: No evidence of maxillofacial fracture.

The visualized paranasal sinuses are essentially clear. The mastoid
air cells are unopacified.

Mild degenerative changes of the right TMJ (series 3/image 58).

The bilateral orbits, including the globes and retroconal soft
tissues, are within normal limits.

The cervical spine is intact to C4-5.
IMPRESSION: No evidence of maxillofacial fracture.

## 2014-04-29 ENCOUNTER — Encounter: Payer: Self-pay | Admitting: *Deleted

## 2014-05-05 ENCOUNTER — Emergency Department (HOSPITAL_COMMUNITY): Payer: Medicare Other

## 2014-05-05 ENCOUNTER — Encounter (HOSPITAL_COMMUNITY): Payer: Self-pay | Admitting: Emergency Medicine

## 2014-05-05 ENCOUNTER — Emergency Department (HOSPITAL_COMMUNITY)
Admission: EM | Admit: 2014-05-05 | Discharge: 2014-05-05 | Disposition: A | Discharge status: Home / Self Care | Payer: Medicare Other | Attending: Emergency Medicine | Admitting: Emergency Medicine

## 2014-05-05 DIAGNOSIS — F4323 Adjustment disorder with mixed anxiety and depressed mood: Secondary | ICD-10-CM | POA: Insufficient documentation

## 2014-05-05 DIAGNOSIS — Z9981 Dependence on supplemental oxygen: Secondary | ICD-10-CM | POA: Insufficient documentation

## 2014-05-05 DIAGNOSIS — Z87891 Personal history of nicotine dependence: Secondary | ICD-10-CM | POA: Insufficient documentation

## 2014-05-05 DIAGNOSIS — E7800 Pure hypercholesterolemia, unspecified: Secondary | ICD-10-CM | POA: Insufficient documentation

## 2014-05-05 DIAGNOSIS — Z8701 Personal history of pneumonia (recurrent): Secondary | ICD-10-CM | POA: Insufficient documentation

## 2014-05-05 DIAGNOSIS — W1809XA Striking against other object with subsequent fall, initial encounter: Secondary | ICD-10-CM | POA: Insufficient documentation

## 2014-05-05 DIAGNOSIS — Z880 Allergy status to penicillin: Secondary | ICD-10-CM | POA: Insufficient documentation

## 2014-05-05 DIAGNOSIS — Z8739 Personal history of other diseases of the musculoskeletal system and connective tissue: Secondary | ICD-10-CM | POA: Insufficient documentation

## 2014-05-05 DIAGNOSIS — Y9389 Activity, other specified: Secondary | ICD-10-CM | POA: Insufficient documentation

## 2014-05-05 DIAGNOSIS — I509 Heart failure, unspecified: Secondary | ICD-10-CM | POA: Insufficient documentation

## 2014-05-05 DIAGNOSIS — J449 Chronic obstructive pulmonary disease, unspecified: Secondary | ICD-10-CM | POA: Insufficient documentation

## 2014-05-05 DIAGNOSIS — Z8673 Personal history of transient ischemic attack (TIA), and cerebral infarction without residual deficits: Secondary | ICD-10-CM | POA: Insufficient documentation

## 2014-05-05 DIAGNOSIS — IMO0002 Reserved for concepts with insufficient information to code with codable children: Secondary | ICD-10-CM | POA: Insufficient documentation

## 2014-05-05 DIAGNOSIS — Z79899 Other long term (current) drug therapy: Secondary | ICD-10-CM | POA: Insufficient documentation

## 2014-05-05 DIAGNOSIS — E119 Type 2 diabetes mellitus without complications: Secondary | ICD-10-CM | POA: Insufficient documentation

## 2014-05-05 DIAGNOSIS — Z8719 Personal history of other diseases of the digestive system: Secondary | ICD-10-CM | POA: Insufficient documentation

## 2014-05-05 DIAGNOSIS — Z862 Personal history of diseases of the blood and blood-forming organs and certain disorders involving the immune mechanism: Secondary | ICD-10-CM | POA: Insufficient documentation

## 2014-05-05 DIAGNOSIS — Y9289 Other specified places as the place of occurrence of the external cause: Secondary | ICD-10-CM | POA: Insufficient documentation

## 2014-05-05 DIAGNOSIS — S0990XA Unspecified injury of head, initial encounter: Secondary | ICD-10-CM | POA: Insufficient documentation

## 2014-05-05 DIAGNOSIS — W19XXXA Unspecified fall, initial encounter: Secondary | ICD-10-CM

## 2014-05-05 DIAGNOSIS — R42 Dizziness and giddiness: Secondary | ICD-10-CM | POA: Insufficient documentation

## 2014-05-05 DIAGNOSIS — J4489 Other specified chronic obstructive pulmonary disease: Secondary | ICD-10-CM | POA: Insufficient documentation

## 2014-05-05 DIAGNOSIS — I10 Essential (primary) hypertension: Secondary | ICD-10-CM | POA: Insufficient documentation

## 2014-05-05 LAB — CBC WITH DIFFERENTIAL/PLATELET
Basophils Absolute: 0 10*3/uL (ref 0.0–0.1)
Basophils Relative: 1 % (ref 0–1)
EOS ABS: 0.2 10*3/uL (ref 0.0–0.7)
EOS PCT: 3 % (ref 0–5)
HCT: 37.4 % (ref 36.0–46.0)
HEMOGLOBIN: 12.2 g/dL (ref 12.0–15.0)
LYMPHS ABS: 1.1 10*3/uL (ref 0.7–4.0)
Lymphocytes Relative: 17 % (ref 12–46)
MCH: 31.1 pg (ref 26.0–34.0)
MCHC: 32.6 g/dL (ref 30.0–36.0)
MCV: 95.4 fL (ref 78.0–100.0)
MONOS PCT: 6 % (ref 3–12)
Monocytes Absolute: 0.4 10*3/uL (ref 0.1–1.0)
Neutro Abs: 4.8 10*3/uL (ref 1.7–7.7)
Neutrophils Relative %: 73 % (ref 43–77)
Platelets: 181 10*3/uL (ref 150–400)
RBC: 3.92 MIL/uL (ref 3.87–5.11)
RDW: 13.3 % (ref 11.5–15.5)
WBC: 6.5 10*3/uL (ref 4.0–10.5)

## 2014-05-05 LAB — URINALYSIS, ROUTINE W REFLEX MICROSCOPIC
BILIRUBIN URINE: NEGATIVE
Glucose, UA: NEGATIVE mg/dL
Hgb urine dipstick: NEGATIVE
KETONES UR: NEGATIVE mg/dL
LEUKOCYTES UA: NEGATIVE
Nitrite: NEGATIVE
PH: 7.5 (ref 5.0–8.0)
Protein, ur: NEGATIVE mg/dL
SPECIFIC GRAVITY, URINE: 1.013 (ref 1.005–1.030)
Urobilinogen, UA: 0.2 mg/dL (ref 0.0–1.0)

## 2014-05-05 LAB — BASIC METABOLIC PANEL
BUN: 15 mg/dL (ref 6–23)
CO2: 29 meq/L (ref 19–32)
Calcium: 9.7 mg/dL (ref 8.4–10.5)
Chloride: 99 mEq/L (ref 96–112)
Creatinine, Ser: 0.65 mg/dL (ref 0.50–1.10)
GFR calc Af Amer: >90 mL/min (ref >90)
GFR calc non Af Amer: 85 mL/min — ABNORMAL LOW (ref >90)
GLUCOSE: 128 mg/dL — AB (ref 70–99)
POTASSIUM: 3.8 meq/L (ref 3.7–5.3)
SODIUM: 141 meq/L (ref 137–147)

## 2014-05-05 LAB — TROPONIN I: Troponin I: <0.3 ng/mL (ref <0.30)

## 2014-05-05 MED ORDER — FENTANYL CITRATE 0.05 MG/ML IJ SOLN
25.0000 ug | Freq: Once | INTRAMUSCULAR | Status: AC
  Administered 2014-05-05: 25 ug via INTRAVENOUS
  Filled 2014-05-05: qty 2

## 2014-05-05 MED ORDER — ONDANSETRON 4 MG PO TBDP
4.0000 mg | ORAL_TABLET | Freq: Once | ORAL | Status: AC
  Administered 2014-05-05: 4 mg via ORAL
  Filled 2014-05-05: qty 1

## 2014-05-05 MED ORDER — ACETAMINOPHEN 325 MG PO TABS
650.0000 mg | ORAL_TABLET | Freq: Once | ORAL | Status: DC
  Filled 2014-05-05: qty 2

## 2014-05-05 MED ORDER — FENTANYL CITRATE 0.05 MG/ML IJ SOLN
50.0000 ug | Freq: Once | INTRAMUSCULAR | Status: AC
  Administered 2014-05-05: 50 ug via INTRAVENOUS
  Filled 2014-05-05: qty 2

## 2014-05-05 NOTE — Discharge Instructions (Signed)
Please follow up with your primary care physician in 1-2 days. If you do not have one please [facility] listed above. Please keep your follow up appointment with your neurologist. Please use Tylenol for headaches. Please read all discharge instructions and return precautions.   Dizziness Dizziness is a common problem. It is a feeling of unsteadiness or light-headedness. You may feel like you are about to faint. Dizziness can lead to injury if you stumble or fall. A person of any age group can suffer from dizziness, but dizziness is more common in older adults. CAUSES  Dizziness can be caused by many different things, including:  Middle ear problems.  Standing for too long.  Infections.  An allergic reaction.  Aging.  An emotional response to something, such as the sight of blood.  Side effects of medicines.  Tiredness.  Problems with circulation or blood pressure.  Excessive use of alcohol or medicines, or illegal drug use.  Breathing too fast (hyperventilation).  An irregular heart rhythm (arrhythmia).  A low red blood cell count (anemia).  Pregnancy.  Vomiting, diarrhea, fever, or other illnesses that cause body fluid loss (dehydration).  Diseases or conditions such as Parkinson's disease, high blood pressure (hypertension), diabetes, and thyroid problems.  Exposure to extreme heat. DIAGNOSIS  Your health care provider will ask about your symptoms, perform a physical exam, and perform an electrocardiogram (ECG) to record the electrical activity of your heart. Your health care provider may also perform other heart or blood tests to determine the cause of your dizziness. These may include:  Transthoracic echocardiogram (TTE). During echocardiography, sound waves are used to evaluate how blood flows through your heart.  Transesophageal echocardiogram (TEE).  Cardiac monitoring. This allows your health care provider to monitor your  heart rate and rhythm in real time.  Holter monitor. This is a portable device that records your heartbeat and can help diagnose heart arrhythmias. It allows your health care provider to track your heart activity for several days if needed.  Stress tests by exercise or by giving medicine that makes the heart beat faster. TREATMENT  Treatment of dizziness depends on the cause of your symptoms and can vary greatly. HOME CARE INSTRUCTIONS   Drink enough fluids to keep your urine clear or pale yellow. This is especially important in very hot weather. In older adults, it is also important in cold weather.  Take your medicine exactly as directed if your dizziness is caused by medicines. When taking blood pressure medicines, it is especially important to get up slowly.  Rise slowly from chairs and steady yourself until you feel okay.  In the morning, first sit up on the side of the bed. When you feel okay, stand slowly while holding onto something until you know your balance is fine.  Move your legs often if you need to stand in one place for a long time. Tighten and relax your muscles in your legs while standing.  Have someone stay with you for 1-2 days if dizziness continues to be a problem. Do this until you feel you are well enough to stay alone. Have the person call your health care provider if he or she notices changes in you that are concerning.  Do not drive or use heavy machinery if you feel dizzy.  Do not drink alcohol. SEEK IMMEDIATE MEDICAL CARE IF:   Your dizziness or light-headedness gets worse.  You feel nauseous or vomit.  You have problems talking, walking, or  using your arms, hands, or legs.  You feel weak.  You are not thinking clearly or you have trouble forming sentences. It may take a friend or family member to notice this.  You have chest pain, abdominal pain, shortness of breath, or sweating.  Your vision changes.  You notice any bleeding.  You have side  effects from medicine that seems to be getting worse rather than better. MAKE SURE YOU:   Understand these instructions.  Will watch your condition.  Will get help right away if you are not doing well or get worse. Document Released: 04/23/2001 Document Revised: 11/02/2013 Document Reviewed: 05/17/2011 [facility] Patient Information 2015 [employer] [place]. This information is not intended to replace advice given to you by your health care provider. Make sure you discuss any questions you have with your health care provider.

## 2014-05-05 NOTE — ED Notes (Signed)
Notified [facility] for transportation back home 

## 2014-05-05 NOTE — ED Notes (Signed)
Patient being taken by [facility] now

## 2014-05-05 NOTE — ED Provider Notes (Signed)
CSN: [id number]     Arrival date & time 05/05/14  0813 History   First MD Initiated Contact with Patient 05/05/14 [phone]     Chief Complaint  Patient presents with  . Fall     (Consider location/radiation/quality/duration/timing/severity/associated sxs/prior Treatment) HPI Comments: Patient is a 78 year old female past medical history significant for hypertrophic cardiomyopathy, hypertension, COPD on 3 L of oxygen at home, atrial fibrillation on Cardizem, pacemaker placement, DM, GERD, anxiety, depression presenting to the emergency department for a fall. Patient states when she awoke she stood up became dizzy causing her to lose her balance and fall hitting her head on the dresser. She did not lose consciousness. She states she then fell and hit her bottom on the ground which is now sore as well. Patient states she has been dizzy intermittently for the last several months, she states she is being worked up by her PCP and multiple neurologists, last visit to neurologist was 2-3 weeks ago. They have an undetermined cause of her dizziness at this time. Patient has also been seen by her cardiologist Dr. Patty SermonsBrackbill for this as well. She has since been taken off the Coumadin d/t recurrent falls. Denies any precipitating CP, HA, SOB.   Patient is a 78 y.o. female presenting with fall.  Fall Associated symptoms include headaches. Pertinent negatives include no chills, fever, numbness or weakness.    Past Medical History  Diagnosis Date  . Hypertrophic cardiomyopathy     a. 11/2013 Echo: EF 55-60%, basal inf HK, sev dil LA, no evidence of HCM.  PASP 60mmHg.  [name]. Hypertension   . Situational mixed anxiety and depressive disorder   . Hypercholesterolemia   . Falls frequently     coumadin stopped  . Pulmonary HTN     a. has had prior right heart cath in 2010; was felt that most likely due to elevated left sided pressures and would not benefit from vasodilator therapy;  b. 11/2013 Echo: PASP 60mmHg.  [name].  Oxygen dependent     "3L 24/7" (08/05/2013)  . COPD (chronic obstructive pulmonary disease)   . TIA (transient ischemic attack)   . Atrial fibrillation     a. Chronic; No longer on coumadin 2/2 frequent falls.  . CHF (congestive heart failure)   . Pacemaker     a. 04/2012 MDT [id number] [facility] [employer] PPM, ser #: [id number] H.  . Varicose veins   . History of pneumonia     "couple times; long time ago" (08/05/2013)  . Type II diabetes mellitus   . Anemia     "as a child" (08/05/2013)  . GERD (gastroesophageal reflux disease)   . H/O hiatal hernia   . Osteoarthritis   . Arthritis     "all over me; in all my joints" (08/05/2013)  . Coccyx pain   . Gout     "right foot" (08/05/2013)  . Anxiety   . Depression   . Chest pain     a. 08/2011 Myoview: sm, fixed anteroseptal defect (attenuation), no ischemia, EF 62%.   Past Surgical History  Procedure Laterality Date  . Total knee arthroplasty Right 2011  . Tonsillectomy    . Appendectomy    . Abdominal hysterectomy      "partial" (08/05/2013)  . Dilation and curettage of uterus      "several" (08/05/2013)  . Foot neuroma surgery Right   . Insert / replace / remove pacemaker  1998; 2000's; 2014  . Cardiac catheterization      "more than once" (  08/05/2013)  . Back surgery      "bone spur removed; mid back" (08/05/2013)   Family History  Problem Relation Age of Onset  . Other Father     died in plane crash  . Cancer Mother     lymphoma-NHL  . Coronary artery disease Grandchild   . Heart disease Maternal Grandmother    History  Substance Use Topics  . Smoking status: Former Smoker -- 1.00 packs/day for 40 years    Types: Cigarettes    Quit date: 11/15/2005  . Smokeless tobacco: Never Used  . Alcohol Use: No   OB History   Grav Para Term Preterm Abortions TAB SAB Ect Mult Living                 Review of Systems  Constitutional: Negative for fever and chills.  Neurological: Positive for dizziness and headaches. Negative for syncope,  weakness and numbness.  All other systems reviewed and are negative.     Allergies  Aspirin; Calan; Crestor; Escitalopram oxalate; Lipitor; Lopressor; Nitroglycerin; Norvasc; Nsaids; Oxycodone; Prednisone; Sulfa antibiotics; Vimovo; and Penicillins  Home Medications   Prior to Admission medications   Medication Sig Start Date End Date Taking? Authorizing Provider  acetaminophen (TYLENOL) 500 MG tablet Take 500 mg by mouth every 6 (six) hours as needed for moderate pain.   Yes Historical Provider, MD  albuterol (PROVENTIL HFA;VENTOLIN HFA) 108 (90 BASE) MCG/ACT inhaler Inhale 2 puffs into the lungs every 4 (four) hours as needed (cough).   Yes Historical Provider, MD  budesonide-formoterol (SYMBICORT) 160-4.5 MCG/ACT inhaler Inhale 2 puffs into the lungs 2 (two) times daily.   Yes Historical Provider, MD  buPROPion (WELLBUTRIN XL) 150 MG 24 hr tablet Take 150 mg by mouth daily.   Yes Historical Provider, MD  calcium carbonate (TUMS - DOSED IN MG ELEMENTAL CALCIUM) 500 MG chewable tablet Chew 1 tablet by mouth 4 (four) times daily as needed for indigestion. Only as needed   Yes Historical Provider, MD  diltiazem (CARDIZEM CD) 240 MG 24 hr capsule Take 1 capsule (240 mg total) by mouth daily. 01/07/14  Yes Cassell Clement, MD  famotidine (PEPCID) 20 MG tablet Take 20 mg by mouth daily as needed for indigestion.   Yes Historical Provider, MD  fluticasone (FLONASE) 50 MCG/ACT nasal spray Place 1 spray into both nostrils daily as needed for allergies.    Yes Historical Provider, MD  furosemide (LASIX) 20 MG tablet Take 20 mg by mouth daily as needed (swelling).   Yes Historical Provider, MD  gabapentin (NEURONTIN) 300 MG capsule Take 300 mg by mouth 2 (two) times daily.    Yes Historical Provider, MD  HYDROcodone-acetaminophen (NORCO/VICODIN) 5-325 MG per tablet Take 1 tablet by mouth every 4 (four) hours as needed for moderate pain.   Yes Historical Provider, MD  hydrOXYzine (VISTARIL) 25 MG  capsule Take 25 mg by mouth every 8 (eight) hours as needed for anxiety.   Yes Historical Provider, MD  loperamide (IMODIUM) 2 MG capsule Take 2 mg by mouth as needed for diarrhea or loose stools.   Yes Historical Provider, MD  losartan (COZAAR) 50 MG tablet Take 50 mg by mouth daily.   Yes Historical Provider, MD  metFORMIN (GLUCOPHAGE) 500 MG tablet Take 500 mg by mouth 2 (two) times daily with a meal.   Yes Historical Provider, MD  Multiple Vitamins-Minerals (MULTIVITAMIN WITH MINERALS) tablet Take 1 tablet by mouth daily.   Yes Historical Provider, MD  Phenazopyridine HCl (AZO TABS  PO) Take 1 tablet by mouth as needed (bladder pain). "as directed on box"   Yes Historical Provider, MD  potassium chloride (KLOR-CON) 20 MEQ packet Take 20 mEq by mouth daily as needed (take with lasix).    Yes Historical Provider, MD  sertraline (ZOLOFT) 100 MG tablet Take 200 mg by mouth daily.    Yes Historical Provider, MD  temazepam (RESTORIL) 15 MG capsule Take 15 mg by mouth at bedtime as needed for sleep.   Yes Historical Provider, MD   BP 155/76  Pulse 86  Temp(Src) 98.1 F (36.7 C) (Oral)  Resp 15  Wt 122 lb (55.339 kg)  SpO2 99% Physical Exam  Nursing note and vitals reviewed. Constitutional: She is oriented to person, place, and time. She appears well-developed and well-nourished. No distress.  HENT:  Head: Normocephalic and atraumatic.  Right Ear: External ear normal.  Left Ear: External ear normal.  Nose: Nose normal.  Mouth/Throat: Oropharynx is clear and moist. No oropharyngeal exudate.  Eyes: Conjunctivae and EOM are normal. Pupils are equal, round, and reactive to light.  Neck: Normal range of motion. Neck supple.  Cardiovascular: Normal rate, normal heart sounds and intact distal pulses.  An irregularly irregular rhythm present.  Pulmonary/Chest: Effort normal and breath sounds normal. No respiratory distress.  Abdominal: Soft. There is no tenderness.  Neurological: She is alert and  oriented to person, place, and time. She has normal strength. No cranial nerve deficit. GCS eye subscore is 4. GCS verbal subscore is 5. GCS motor subscore is 6.  Sensation grossly intact.  No pronator drift.  Bilateral heel-knee-shin intact.  Skin: Skin is warm and dry. She is not diaphoretic.    ED Course  Procedures (including critical care time) Medications  ondansetron (ZOFRAN-ODT) disintegrating tablet 4 mg (4 mg Oral Given 05/05/14 0930)  fentaNYL (SUBLIMAZE) injection 25 mcg (25 mcg Intravenous Given 05/05/14 1128)  fentaNYL (SUBLIMAZE) injection 50 mcg (50 mcg Intravenous Given 05/05/14 1233)    Labs Review Labs Reviewed  BASIC METABOLIC PANEL - Abnormal; Notable for the following:    Glucose, Bld 128 (*)    GFR calc non Af Amer 85 (*)    All other components within normal limits  URINE CULTURE  CBC WITH DIFFERENTIAL  TROPONIN I  URINALYSIS, ROUTINE W REFLEX MICROSCOPIC    Imaging Review Dg Lumbar Spine Complete  05/05/2014   CLINICAL DATA:  Recent traumatic injury with back pain  EXAM: LUMBAR SPINE - COMPLETE 4+ VIEW  COMPARISON:  11/18/2013  FINDINGS: Five lumbar type vertebral bodies are well visualized. No pars defects are seen. Compression deformity of L4 is again seen and stable from the prior exam. No new compression deformity is seen. No other fractures are noted. Diffuse aortic calcifications are noted.  IMPRESSION: Stable L4 compression deformity.  No acute abnormality noted.   Electronically Signed   By: Alcide Clever M.D.   On: 05/05/2014 10:07   Dg Sacrum/coccyx  05/05/2014   CLINICAL DATA:  Back pain after fall.  EXAM: SACRUM AND COCCYX - 2+ VIEW  COMPARISON:  None.  FINDINGS: No fracture or other abnormality is seen involving the sacrum or coccyx. Sacroiliac joints appear normal.  IMPRESSION: Normal sacrum and coccyx.   Electronically Signed   By: Roque Lias M.D.   On: 05/05/2014 10:08   Ct Head Wo Contrast  05/05/2014   CLINICAL DATA:  Recent traumatic injury  with pain  EXAM: CT HEAD WITHOUT CONTRAST  CT CERVICAL SPINE WITHOUT CONTRAST  TECHNIQUE:  Multidetector CT imaging of the head and cervical spine was performed following the standard protocol without intravenous contrast. Multiplanar CT image reconstructions of the cervical spine were also generated.  COMPARISON:  04/16/2014  FINDINGS: CT HEAD FINDINGS  The bony calvarium is intact. No focal soft tissue abnormality is noted. Diffuse atrophic and chronic white matter ischemic change is seen. Basal ganglia calcifications are noted bilaterally. No findings to suggest acute hemorrhage, acute infarction or space-occupying mass lesion are noted.  CT CERVICAL SPINE FINDINGS  Seven cervical segments are well visualized. Vertebral body height is well maintained. Multilevel facet hypertrophic changes are noted slightly worse on the left than the right. Osteophytic changes are noted at C5-6 and C6-7. No acute fracture or acute facet abnormality is noted.  IMPRESSION: CT of the head: Chronic changes without acute abnormality.  CT of the cervical spine: Chronic changes without acute abnormality.   Electronically Signed   By: Alcide Clever M.D.   On: 05/05/2014 10:27   Ct Cervical Spine Wo Contrast  05/05/2014   CLINICAL DATA:  Recent traumatic injury with pain  EXAM: CT HEAD WITHOUT CONTRAST  CT CERVICAL SPINE WITHOUT CONTRAST  TECHNIQUE: Multidetector CT imaging of the head and cervical spine was performed following the standard protocol without intravenous contrast. Multiplanar CT image reconstructions of the cervical spine were also generated.  COMPARISON:  04/16/2014  FINDINGS: CT HEAD FINDINGS  The bony calvarium is intact. No focal soft tissue abnormality is noted. Diffuse atrophic and chronic white matter ischemic change is seen. Basal ganglia calcifications are noted bilaterally. No findings to suggest acute hemorrhage, acute infarction or space-occupying mass lesion are noted.  CT CERVICAL SPINE FINDINGS  Seven  cervical segments are well visualized. Vertebral body height is well maintained. Multilevel facet hypertrophic changes are noted slightly worse on the left than the right. Osteophytic changes are noted at C5-6 and C6-7. No acute fracture or acute facet abnormality is noted.  IMPRESSION: CT of the head: Chronic changes without acute abnormality.  CT of the cervical spine: Chronic changes without acute abnormality.   Electronically Signed   By: Alcide Clever M.D.   On: 05/05/2014 10:27     EKG Interpretation None      MDM   Final diagnoses:  Dizziness  Fall, initial encounter    Filed Vitals:   05/05/14 1314  BP:   Pulse:   Temp: 98.1 F (36.7 C)  Resp:    Afebrile, NAD, non-toxic appearing, AAOx4. I have reviewed nursing notes, vital signs, and all appropriate lab and imaging results for this patient. No neurofocal deficits on examination. Patient with recurrent dizziness without acute cause despite multiple physicians working her up for this. Patient is currently under the care of a neurologist in [place] and is due to follow up in the next few weeks. Patient with recurrent fall due to dizziness. No loss of consciousness. No precipitating headache, chest pain or shortness of breath. CT head and neck is unremarkable. X-rays are unremarkable. Labs are unremarkable. EKG unremarkable. No changes in patient's symptoms from previous. No acute change a finding noted. Recurrent falls likely due to long-standing history of dizziness. Primary care doctor, cardiologist, neurologist have been unable to determine cause of dizziness. At this time no cause of dizziness was obtained. Patient is agreeable to discharge home to follow up with PCP and neurologist. Return precautions discussed. Patient is agreeable to plan. Patient is stable at time of discharge. Patient d/w with Dr. Fayrene Fearing, agrees with plan.  Jeannetta EllisJennifer L Piepenbrink, PA-C 05/05/14 1639

## 2014-05-05 NOTE — ED Notes (Signed)
Patient waiting for [facility]. 

## 2014-05-05 NOTE — ED Notes (Signed)
Patient not present in the room at this time.  Will request urine on her return.

## 2014-05-05 NOTE — ED Notes (Signed)
[facility] called and given report. Candice, SEC paging [facility] for transport.

## 2014-05-05 NOTE — ED Notes (Signed)
States also having pain in buttocks from fall. Also states lost approx 30# in past 2-3 months

## 2014-05-05 NOTE — ED Notes (Addendum)
[facility] [address] With c/o fall this morning hitting head on dresser. Denies any LOC, no obvious injury. Has been falling more in past few weeks, does have hx of vertigo. Saw private MD last week for same. Was recently treated for UTI approx three weeks ago. Alert/oriented x 3, skin w/d.

## 2014-05-06 LAB — URINE CULTURE
COLONY COUNT: NO GROWTH
CULTURE: NO GROWTH

## 2014-05-09 NOTE — ED Provider Notes (Signed)
Medical screening examination/treatment/procedure(s) were performed by non-physician practitioner and as supervising physician I was immediately available for consultation/collaboration.   EKG Interpretation   Date/Time:  Thursday May 05 2014 08:28:15 EDT Ventricular Rate:  90 PR Interval:    QRS Duration: 100 QT Interval:  456 QTC Calculation: 558 R Axis:   58 Text Interpretation:  Atrial fibrillation Probable LVH with secondary  repol abnrm ST elevation, consider inferior injury Prolonged QT interval  ED PHYSICIAN INTERPRETATION AVAILABLE IN [facility] Confirmed by  TEST, Record ([id number]) on 05/07/2014 8:44:51 AM        Rolland PorterMark James, MD 05/09/14 2144

## 2014-05-11 ENCOUNTER — Other Ambulatory Visit: Payer: Self-pay | Admitting: *Deleted

## 2014-05-11 NOTE — Progress Notes (Signed)
She is no longer our patient, sent fax to pharmacy.

## 2014-05-21 ENCOUNTER — Emergency Department (HOSPITAL_COMMUNITY): Payer: Medicare Other

## 2014-05-21 ENCOUNTER — Encounter (HOSPITAL_COMMUNITY): Payer: Self-pay | Admitting: Emergency Medicine

## 2014-05-21 ENCOUNTER — Emergency Department (HOSPITAL_COMMUNITY)
Admission: EM | Admit: 2014-05-21 | Discharge: 2014-05-21 | Disposition: A | Discharge status: Home / Self Care | Payer: Medicare Other | Attending: Emergency Medicine | Admitting: Emergency Medicine

## 2014-05-21 DIAGNOSIS — Z880 Allergy status to penicillin: Secondary | ICD-10-CM | POA: Insufficient documentation

## 2014-05-21 DIAGNOSIS — R42 Dizziness and giddiness: Secondary | ICD-10-CM | POA: Insufficient documentation

## 2014-05-21 DIAGNOSIS — S51809A Unspecified open wound of unspecified forearm, initial encounter: Secondary | ICD-10-CM | POA: Insufficient documentation

## 2014-05-21 DIAGNOSIS — Y9389 Activity, other specified: Secondary | ICD-10-CM | POA: Insufficient documentation

## 2014-05-21 DIAGNOSIS — I4891 Unspecified atrial fibrillation: Secondary | ICD-10-CM | POA: Insufficient documentation

## 2014-05-21 DIAGNOSIS — Z79899 Other long term (current) drug therapy: Secondary | ICD-10-CM | POA: Insufficient documentation

## 2014-05-21 DIAGNOSIS — W19XXXA Unspecified fall, initial encounter: Secondary | ICD-10-CM

## 2014-05-21 DIAGNOSIS — Z8673 Personal history of transient ischemic attack (TIA), and cerebral infarction without residual deficits: Secondary | ICD-10-CM | POA: Insufficient documentation

## 2014-05-21 DIAGNOSIS — J449 Chronic obstructive pulmonary disease, unspecified: Secondary | ICD-10-CM | POA: Insufficient documentation

## 2014-05-21 DIAGNOSIS — Z8701 Personal history of pneumonia (recurrent): Secondary | ICD-10-CM | POA: Insufficient documentation

## 2014-05-21 DIAGNOSIS — S0990XA Unspecified injury of head, initial encounter: Secondary | ICD-10-CM | POA: Insufficient documentation

## 2014-05-21 DIAGNOSIS — F411 Generalized anxiety disorder: Secondary | ICD-10-CM | POA: Insufficient documentation

## 2014-05-21 DIAGNOSIS — F3289 Other specified depressive episodes: Secondary | ICD-10-CM | POA: Insufficient documentation

## 2014-05-21 DIAGNOSIS — S41109A Unspecified open wound of unspecified upper arm, initial encounter: Secondary | ICD-10-CM | POA: Insufficient documentation

## 2014-05-21 DIAGNOSIS — F329 Major depressive disorder, single episode, unspecified: Secondary | ICD-10-CM | POA: Insufficient documentation

## 2014-05-21 DIAGNOSIS — K219 Gastro-esophageal reflux disease without esophagitis: Secondary | ICD-10-CM | POA: Insufficient documentation

## 2014-05-21 DIAGNOSIS — Y9289 Other specified places as the place of occurrence of the external cause: Secondary | ICD-10-CM | POA: Insufficient documentation

## 2014-05-21 DIAGNOSIS — W1809XA Striking against other object with subsequent fall, initial encounter: Secondary | ICD-10-CM | POA: Insufficient documentation

## 2014-05-21 DIAGNOSIS — E119 Type 2 diabetes mellitus without complications: Secondary | ICD-10-CM | POA: Insufficient documentation

## 2014-05-21 DIAGNOSIS — J4489 Other specified chronic obstructive pulmonary disease: Secondary | ICD-10-CM | POA: Insufficient documentation

## 2014-05-21 DIAGNOSIS — I509 Heart failure, unspecified: Secondary | ICD-10-CM | POA: Insufficient documentation

## 2014-05-21 DIAGNOSIS — Z87891 Personal history of nicotine dependence: Secondary | ICD-10-CM | POA: Insufficient documentation

## 2014-05-21 DIAGNOSIS — Z9981 Dependence on supplemental oxygen: Secondary | ICD-10-CM | POA: Insufficient documentation

## 2014-05-21 DIAGNOSIS — Z862 Personal history of diseases of the blood and blood-forming organs and certain disorders involving the immune mechanism: Secondary | ICD-10-CM | POA: Insufficient documentation

## 2014-05-21 DIAGNOSIS — M1990 Unspecified osteoarthritis, unspecified site: Secondary | ICD-10-CM | POA: Insufficient documentation

## 2014-05-21 DIAGNOSIS — IMO0002 Reserved for concepts with insufficient information to code with codable children: Secondary | ICD-10-CM

## 2014-05-21 DIAGNOSIS — I10 Essential (primary) hypertension: Secondary | ICD-10-CM | POA: Insufficient documentation

## 2014-05-21 DIAGNOSIS — Z9181 History of falling: Secondary | ICD-10-CM | POA: Insufficient documentation

## 2014-05-21 LAB — DIFFERENTIAL
BASOS ABS: 0 10*3/uL (ref 0.0–0.1)
BASOS PCT: 0 % (ref 0–1)
EOS ABS: 0.2 10*3/uL (ref 0.0–0.7)
Eosinophils Relative: 3 % (ref 0–5)
Lymphocytes Relative: 13 % (ref 12–46)
Lymphs Abs: 0.9 10*3/uL (ref 0.7–4.0)
Monocytes Absolute: 0.5 10*3/uL (ref 0.1–1.0)
Monocytes Relative: 7 % (ref 3–12)
NEUTROS PCT: 77 % (ref 43–77)
Neutro Abs: 5.4 10*3/uL (ref 1.7–7.7)

## 2014-05-21 LAB — CBC
HCT: 38.7 % (ref 36.0–46.0)
Hemoglobin: 12.5 g/dL (ref 12.0–15.0)
MCH: 31.1 pg (ref 26.0–34.0)
MCHC: 32.3 g/dL (ref 30.0–36.0)
MCV: 96.3 fL (ref 78.0–100.0)
PLATELETS: 220 10*3/uL (ref 150–400)
RBC: 4.02 MIL/uL (ref 3.87–5.11)
RDW: 13.9 % (ref 11.5–15.5)
WBC: 7.1 10*3/uL (ref 4.0–10.5)

## 2014-05-21 LAB — COMPREHENSIVE METABOLIC PANEL
ALBUMIN: 4 g/dL (ref 3.5–5.2)
ALK PHOS: 94 U/L (ref 39–117)
ALT: 11 U/L (ref 0–35)
ANION GAP: 17 — AB (ref 5–15)
AST: 31 U/L (ref 0–37)
BUN: 15 mg/dL (ref 6–23)
CO2: 26 mEq/L (ref 19–32)
Calcium: 9.5 mg/dL (ref 8.4–10.5)
Chloride: 97 mEq/L (ref 96–112)
Creatinine, Ser: 0.94 mg/dL (ref 0.50–1.10)
GFR calc Af Amer: 67 mL/min — ABNORMAL LOW (ref >90)
GFR calc non Af Amer: 58 mL/min — ABNORMAL LOW (ref >90)
Glucose, Bld: 123 mg/dL — ABNORMAL HIGH (ref 70–99)
Potassium: 3.7 mEq/L (ref 3.7–5.3)
SODIUM: 140 meq/L (ref 137–147)
TOTAL PROTEIN: 7.2 g/dL (ref 6.0–8.3)
Total Bilirubin: 0.5 mg/dL (ref 0.3–1.2)

## 2014-05-21 LAB — PROTIME-INR
INR: 1.09 (ref 0.00–1.49)
Prothrombin Time: 14.1 seconds (ref 11.6–15.2)

## 2014-05-21 LAB — I-STAT TROPONIN, ED: Troponin i, poc: 0.01 ng/mL (ref 0.00–0.08)

## 2014-05-21 LAB — APTT: APTT: 30 s (ref 24–37)

## 2014-05-21 LAB — CBG MONITORING, ED: Glucose-Capillary: 99 mg/dL (ref 70–99)

## 2014-05-21 NOTE — ED Notes (Signed)
[facility] arrival for discharge

## 2014-05-21 NOTE — Discharge Instructions (Signed)
Fall Prevention and Home Safety Your x-rays are negative for fracture or other serious injury. Follow up with your doctor. Return to the ED develop for new or worsening symptoms. Falls cause injuries and can affect all age groups. It is possible to use preventive measures to significantly decrease the likelihood of falls. There are many simple measures which can make your home safer and prevent falls. OUTDOORS  Repair cracks and edges of walkways and driveways.  Remove high doorway thresholds.  Trim shrubbery on the main path into your home.  Have good outside lighting.  Clear walkways of tools, rocks, debris, and clutter.  Check that handrails are not broken and are securely fastened. Both sides of steps should have handrails.  Have leaves, snow, and ice cleared regularly.  Use sand or salt on walkways during winter months.  In the garage, clean up grease or oil spills. BATHROOM  Install night lights.  Install grab bars by the toilet and in the tub and shower.  Use non-skid mats or decals in the tub or shower.  Place a plastic non-slip stool in the shower to sit on, if needed.  Keep floors dry and clean up all water on the floor immediately.  Remove soap buildup in the tub or shower on a regular basis.  Secure bath mats with non-slip, double-sided rug tape.  Remove throw rugs and tripping hazards from the floors. BEDROOMS  Install night lights.  Make sure a bedside light is easy to reach.  Do not use oversized bedding.  Keep a telephone by your bedside.  Have a firm chair with side arms to use for getting dressed.  Remove throw rugs and tripping hazards from the floor. KITCHEN  Keep handles on pots and pans turned toward the center of the stove. Use back burners when possible.  Clean up spills quickly and allow time for drying.  Avoid walking on wet floors.  Avoid hot utensils and knives.  Position shelves so they are not too high or low.  Place  commonly used objects within easy reach.  If necessary, use a sturdy step stool with a grab bar when reaching.  Keep electrical cables out of the way.  Do not use floor polish or wax that makes floors slippery. If you must use wax, use non-skid floor wax.  Remove throw rugs and tripping hazards from the floor. STAIRWAYS  Never leave objects on stairs.  Place handrails on both sides of stairways and use them. Fix any loose handrails. Make sure handrails on both sides of the stairways are as long as the stairs.  Check carpeting to make sure it is firmly attached along stairs. Make repairs to worn or loose carpet promptly.  Avoid placing throw rugs at the top or bottom of stairways, or properly secure the rug with carpet tape to prevent slippage. Get rid of throw rugs, if possible.  Have an electrician put in a light switch at the top and bottom of the stairs. OTHER FALL PREVENTION TIPS  Wear low-heel or rubber-soled shoes that are supportive and fit well. Wear closed toe shoes.  When using a stepladder, make sure it is fully opened and both spreaders are firmly locked. Do not climb a closed stepladder.  Add color or contrast paint or tape to grab bars and handrails in your home. Place contrasting color strips on first and last steps.  Learn and use mobility aids as needed. Install an electrical emergency response system.  Turn on lights to avoid dark areas.  Replace light bulbs that burn out immediately. Get light switches that glow.  Arrange furniture to create clear pathways. Keep furniture in the same place.  Firmly attach carpet with non-skid or double-sided tape.  Eliminate uneven floor surfaces.  Select a carpet pattern that does not visually hide the edge of steps.  Be aware of all pets. OTHER HOME SAFETY TIPS  Set the water temperature for 120 F (48.8 C).  Keep emergency numbers on or near the telephone.  Keep smoke detectors on every level of the home and near  sleeping areas. Document Released: 10/18/2002 Document Revised: 04/28/2012 Document Reviewed: 01/17/2012 [facility] Patient Information 2015 [employer] [place]. This information is not intended to replace advice given to you by your health care provider. Make sure you discuss any questions you have with your health care provider.

## 2014-05-21 NOTE — ED Notes (Signed)
[facility]  Ems brought patient in after having a fall from standing position while using her walker.  Pt reports dizziness.  Pt states that she has been having hand eye coordination problems since yesterday and states she was spilling her food and drink on her.  No LOC/Neck pain.  Pt reports that she has been hurting in the back of her head for a while.  CBG 99.  Pt has no drifts or facial deficits, but does feel off balance for the last several months.

## 2014-05-21 NOTE — ED Notes (Signed)
Pt walked in hallway with walker with no problems.

## 2014-05-21 NOTE — ED Provider Notes (Signed)
CSN: [id number]     Arrival date & time 05/21/14  1605 History   First MD Initiated Contact with Patient 05/21/14 1644     Chief Complaint  Patient presents with  . Fall  . Dizziness  . Abrasion    Left arm skin tears times 3     (Consider location/radiation/quality/duration/timing/severity/associated sxs/prior Treatment) HPI Comments: Patient presents from assisted living after fall today. She was walking to get her walker when she lost her balance and hit her head. She endorses dizziness and frequent falls. She denies losing consciousness. She is not on any blood thinners. She complains of pain the back of her head and her neck and she has multiple skin tears on her left arm. She. She denies any fever or vomiting. No focal weakness, numbness or tingling. No bowel or bladder incontinence. She has a history of COPD and home oxygen. Previous TIA, A. fib with pacemaker. History of diabetes and hypertension. No chest pain, SOB, palpitations.  Previous workups for dizziness by cardiology and neurology without cause.  The history is provided by the patient.    Past Medical History  Diagnosis Date  . Hypertrophic cardiomyopathy     a. 11/2013 Echo: EF 55-60%, basal inf HK, sev dil LA, no evidence of HCM.  PASP 60mmHg.  [name]. Hypertension   . Situational mixed anxiety and depressive disorder   . Hypercholesterolemia   . Falls frequently     coumadin stopped  . Pulmonary HTN     a. has had prior right heart cath in 2010; was felt that most likely due to elevated left sided pressures and would not benefit from vasodilator therapy;  b. 11/2013 Echo: PASP 60mmHg.  [name]. Oxygen dependent     "3L 24/7" (08/05/2013)  . COPD (chronic obstructive pulmonary disease)   . TIA (transient ischemic attack)   . Atrial fibrillation     a. Chronic; No longer on coumadin 2/2 frequent falls.  . CHF (congestive heart failure)   . Pacemaker     a. 04/2012 MDT [id number] [employer] DC PPM, ser #: [id number] H.  . Varicose veins    . History of pneumonia     "couple times; long time ago" (08/05/2013)  . Type II diabetes mellitus   . Anemia     "as a child" (08/05/2013)  . GERD (gastroesophageal reflux disease)   . H/O hiatal hernia   . Osteoarthritis   . Arthritis     "all over me; in all my joints" (08/05/2013)  . Coccyx pain   . Gout     "right foot" (08/05/2013)  . Anxiety   . Depression   . Chest pain     a. 08/2011 Myoview: sm, fixed anteroseptal defect (attenuation), no ischemia, EF 62%.   Past Surgical History  Procedure Laterality Date  . Total knee arthroplasty Right 2011  . Tonsillectomy    . Appendectomy    . Abdominal hysterectomy      "partial" (08/05/2013)  . Dilation and curettage of uterus      "several" (08/05/2013)  . Foot neuroma surgery Right   . Insert / replace / remove pacemaker  1998; 2000's; 2014  . Cardiac catheterization      "more than once" (08/05/2013)  . Back surgery      "bone spur removed; mid back" (08/05/2013)   Family History  Problem Relation Age of Onset  . Other Father     died in plane crash  . Cancer Mother     lymphoma-NHL  .  Coronary artery disease Grandchild   . Heart disease Maternal Grandmother    History  Substance Use Topics  . Smoking status: Former Smoker -- 1.00 packs/day for 40 years    Types: Cigarettes    Quit date: 11/15/2005  . Smokeless tobacco: Never Used  . Alcohol Use: No   OB History   Grav Para Term Preterm Abortions TAB SAB Ect Mult Living                 Review of Systems  Constitutional: Positive for activity change and appetite change. Negative for fever.  HENT: Negative for congestion and rhinorrhea.   Respiratory: Negative for cough, chest tightness and shortness of breath.   Cardiovascular: Negative for chest pain.  Gastrointestinal: Negative for nausea and abdominal pain.  Genitourinary: Negative for dysuria, hematuria, vaginal bleeding and vaginal discharge.  Musculoskeletal: Positive for arthralgias, back pain,  myalgias and neck pain.  Skin: Negative for rash.  Neurological: Positive for dizziness and weakness. Negative for headaches.  A complete 10 system review of systems was obtained and all systems are negative except as noted in the HPI and PMH.      Allergies  Aspirin; Calan; Crestor; Escitalopram oxalate; Lipitor; Lopressor; Nitroglycerin; Norvasc; Nsaids; Oxycodone; Prednisone; Sulfa antibiotics; Vimovo; and Penicillins  Home Medications   Prior to Admission medications   Medication Sig Start Date End Date Taking? Authorizing Provider  acetaminophen (TYLENOL) 500 MG tablet Take 500 mg by mouth every 6 (six) hours as needed for moderate pain.   Yes Historical Provider, MD  albuterol (PROVENTIL HFA;VENTOLIN HFA) 108 (90 BASE) MCG/ACT inhaler Inhale 2 puffs into the lungs every 4 (four) hours as needed (cough).   Yes Historical Provider, MD  buPROPion (WELLBUTRIN XL) 150 MG 24 hr tablet Take 450 mg by mouth daily.    Yes Historical Provider, MD  calcium carbonate (TUMS - DOSED IN MG ELEMENTAL CALCIUM) 500 MG chewable tablet Chew 1 tablet by mouth 4 (four) times daily as needed for indigestion. Only as needed   Yes Historical Provider, MD  diltiazem (CARDIZEM CD) 240 MG 24 hr capsule Take 1 capsule (240 mg total) by mouth daily. 01/07/14  Yes Cassell Clement, MD  famotidine (PEPCID) 20 MG tablet Take 20 mg by mouth daily as needed for indigestion.   Yes Historical Provider, MD  fluticasone (FLONASE) 50 MCG/ACT nasal spray Place 1 spray into both nostrils daily as needed for allergies.    Yes Historical Provider, MD  furosemide (LASIX) 20 MG tablet Take 20 mg by mouth daily as needed (swelling).   Yes Historical Provider, MD  gabapentin (NEURONTIN) 300 MG capsule Take 300 mg by mouth daily.    Yes Historical Provider, MD  HYDROcodone-acetaminophen (NORCO/VICODIN) 5-325 MG per tablet Take 1 tablet by mouth every 4 (four) hours as needed for moderate pain.   Yes Historical Provider, MD   hydrOXYzine (VISTARIL) 25 MG capsule Take 25 mg by mouth every 8 (eight) hours as needed for anxiety.   Yes Historical Provider, MD  loperamide (IMODIUM) 2 MG capsule Take 2 mg by mouth as needed for diarrhea or loose stools.   Yes Historical Provider, MD  losartan (COZAAR) 50 MG tablet Take 50 mg by mouth daily.   Yes Historical Provider, MD  metFORMIN (GLUCOPHAGE) 500 MG tablet Take 500 mg by mouth 2 (two) times daily with a meal.   Yes Historical Provider, MD  Multiple Vitamins-Iron (MULTIVITAMINS WITH IRON) TABS tablet Take 1 tablet by mouth daily.   Yes Historical  Provider, MD  potassium chloride (KLOR-CON) 20 MEQ packet Take 20 mEq by mouth daily as needed (take with lasix).    Yes Historical Provider, MD  sertraline (ZOLOFT) 100 MG tablet Take 200 mg by mouth daily.    Yes Historical Provider, MD  temazepam (RESTORIL) 15 MG capsule Take 15 mg by mouth at bedtime as needed for sleep.   Yes Historical Provider, MD   BP 118/93  Pulse 106  Temp(Src) 98.5 F (36.9 C) (Oral)  Resp 20  SpO2 99% Physical Exam  Nursing note and vitals reviewed. Constitutional: She is oriented to person, place, and time. She appears well-developed and well-nourished. No distress.  HENT:  Head: Normocephalic and atraumatic.  Mouth/Throat: Oropharynx is clear and moist. No oropharyngeal exudate.  Eyes: Conjunctivae and EOM are normal. Pupils are equal, round, and reactive to light.  Neck: Normal range of motion. Neck supple.  Diffuse C-spine tenderness without step-off  Cardiovascular: Normal rate, regular rhythm, normal heart sounds and intact distal pulses.   No murmur heard. Pulmonary/Chest: Effort normal and breath sounds normal. No respiratory distress.  Abdominal: Soft. There is no tenderness. There is no rebound and no guarding.  Musculoskeletal: Normal range of motion. She exhibits tenderness. She exhibits no edema.  Skin tear left upper arm with active oozing. Skin tear to lower forearm and dorsal  hand. Intact distal pulses.  Neurological: She is alert and oriented to person, place, and time. No cranial nerve deficit. She exhibits normal muscle tone. Coordination normal.  No ataxia on finger to nose bilaterally. No pronator drift. 5/5 strength throughout. CN 2-12 intact.  Equal grip strength. Sensation intact.   Skin: Skin is warm.  Psychiatric: She has a normal mood and affect. Her behavior is normal.    ED Course  Procedures (including critical care time) Labs Review Labs Reviewed  COMPREHENSIVE METABOLIC PANEL - Abnormal; Notable for the following:    Glucose, Bld 123 (*)    GFR calc non Af Amer 58 (*)    GFR calc Af Amer 67 (*)    Anion gap 17 (*)    All other components within normal limits  PROTIME-INR  APTT  CBC  DIFFERENTIAL  CBG MONITORING, ED  Rosezena Sensor, ED    Imaging Review Dg Chest 2 View  05/21/2014   CLINICAL DATA:  CHF.  EXAM: CHEST  2 VIEW  COMPARISON:  None.  FINDINGS: Mediastinum and hilar structures normal. Cardiomegaly with normal pulmonary vascularity. Cardiac pacer with lead tips in the right and the right ventricle. No pleural effusion or pneumothorax. Interstitial changes noted consistent with chronic interstitial lung disease. Degenerative changes lumbar spine.  IMPRESSION: 1. Cardiomegaly. No evidence of overt congestive heart failure. Cardiac pacer with lead tips in the right atrium and right ventricle. 2. Mild chronic interstitial lung disease.   Electronically Signed   By: Maisie Fus  Register   On: 05/21/2014 18:57   Dg Elbow Complete Left  05/21/2014   CLINICAL DATA:  Fall and abrasion.  EXAM: LEFT ELBOW - COMPLETE 3+ VIEW  COMPARISON:  None.  FINDINGS: There is no evidence of fracture, dislocation, or joint effusion. Mild asymmetric subcutaneous stranding is seen along the mid medial aspect of the distal humerus. No soft tissue gas or radiopaque foreign body is identified. Soft tissues are unremarkable.  IMPRESSION: No acute bony abnormality.    Electronically Signed   By: Britta Mccreedy M.D.   On: 05/21/2014 18:56   Dg Forearm Left  05/21/2014   CLINICAL DATA:  Injury, left forearm pain.  EXAM: LEFT FOREARM - 2 VIEW  COMPARISON:  None.  FINDINGS: No acute bony or joint abnormality is identified. First CMC osteoarthritis is noted. Soft tissue structures are unremarkable.  IMPRESSION: No acute finding.   Electronically Signed   By: Drusilla Kanner M.D.   On: 05/21/2014 18:55   Ct Head (brain) Wo Contrast  05/21/2014   CLINICAL DATA:  Status post fall.  Dizziness.  EXAM: CT HEAD WITHOUT CONTRAST  CT CERVICAL SPINE WITHOUT CONTRAST  TECHNIQUE: Multidetector CT imaging of the head and cervical spine was performed following the standard protocol without intravenous contrast. Multiplanar CT image reconstructions of the cervical spine were also generated.  COMPARISON:  Head and cervical spine CT scan 05/05/2014.  FINDINGS: CT HEAD FINDINGS  Again seen are atrophy and chronic microvascular ischemic change. No evidence of acute intracranial abnormality including infarct, hemorrhage, mass lesion, mass effect, midline shift or abnormal extra-axial fluid collection is identified. There is no hydrocephalus or pneumocephalus. The calvarium is intact.  CT CERVICAL SPINE FINDINGS  There is no fracture or malalignment of the cervical spine. Multilevel facet arthropathy and degenerative disc disease are again seen. Schmorl's node in the superior endplate of T1 is noted. Imaged paraspinous structures are unremarkable.  IMPRESSION: No acute finding head or cervical spine. Stable compared to prior exam.   Electronically Signed   By: Drusilla Kanner M.D.   On: 05/21/2014 18:15   Ct Cervical Spine Wo Contrast  05/21/2014   CLINICAL DATA:  Status post fall.  Dizziness.  EXAM: CT HEAD WITHOUT CONTRAST  CT CERVICAL SPINE WITHOUT CONTRAST  TECHNIQUE: Multidetector CT imaging of the head and cervical spine was performed following the standard protocol without intravenous  contrast. Multiplanar CT image reconstructions of the cervical spine were also generated.  COMPARISON:  Head and cervical spine CT scan 05/05/2014.  FINDINGS: CT HEAD FINDINGS  Again seen are atrophy and chronic microvascular ischemic change. No evidence of acute intracranial abnormality including infarct, hemorrhage, mass lesion, mass effect, midline shift or abnormal extra-axial fluid collection is identified. There is no hydrocephalus or pneumocephalus. The calvarium is intact.  CT CERVICAL SPINE FINDINGS  There is no fracture or malalignment of the cervical spine. Multilevel facet arthropathy and degenerative disc disease are again seen. Schmorl's node in the superior endplate of T1 is noted. Imaged paraspinous structures are unremarkable.  IMPRESSION: No acute finding head or cervical spine. Stable compared to prior exam.   Electronically Signed   By: Drusilla Kanner M.D.   On: 05/21/2014 18:15   Dg Humerus Left  05/21/2014   CLINICAL DATA:  Injury, left humerus pain.  EXAM: LEFT HUMERUS - 2+ VIEW  COMPARISON:  None.  FINDINGS: There is no acute bony or joint abnormality. Acromioclavicular degenerative change is noted. Imaged left lung and ribs are unremarkable.  IMPRESSION: No acute finding.   Electronically Signed   By: Drusilla Kanner M.D.   On: 05/21/2014 18:56     EKG Interpretation   Date/Time:  Saturday May 21 2014 16:09:49 EDT Ventricular Rate:  88 PR Interval:    QRS Duration: 96 QT Interval:  396 QTC Calculation: 479 R Axis:   76 Text Interpretation:  Atrial fibrillation with a competing junctional  pacemaker Nonspecific ST and T wave abnormality Prolonged QT Abnormal ECG  No significant change was found Confirmed by Manus Gunning  MD, Amandalynn Pitz [phone])  on 05/21/2014 8:39:51 PM      MDM   Final diagnoses:  Fall, initial encounter  Skin tear  Head injury, initial encounter   Patient from assisted living facility with fall from standing position while trying to get her walker.  She hit her head but did not lose consciousness. She complains of head and neck pain. No focal weakness, numbness or tingling. She has a history of chronic dizziness with multiple falls. She is not on Coumadin. No chest pain or SOB.  Per chart review, patient has had extensive workup for dizziness in the past by both cardiology and neurology without etiology identified. Patient confirms that her dizziness she experienced today is similar to her previous episodes. CT head and C-spine are negative.  Dizziness ongoing for several months. Unable to obtain MRI due to patient's pacemaker. However her neurological exam is nonfocal and she ambulatory with a walker which is her baseline. xrays are negative, tetanus is uptodate. She is ambulatory and stable for discharge.  BP 118/93  Pulse 106  Temp(Src) 98.5 F (36.9 C) (Oral)  Resp 20  SpO2 99%   Glynn Octave, MD 05/22/14 [phone]

## 2014-05-21 NOTE — ED Notes (Signed)
MD at bedside. 

## 2014-05-27 IMAGING — US US ABDOMEN COMPLETE
1 series · 14 of 25 positions shown · non-contrast
Comparison: CT abdomen pelvis of 10/18/1999

CLINICAL DATA: Severe abdominal pain, nausea

COMPLETE ABDOMINAL ULTRASOUND

[Series 1: us abdomen complete · 0.20mm/px · 14 of 89 slices shown]
[im 1/89]
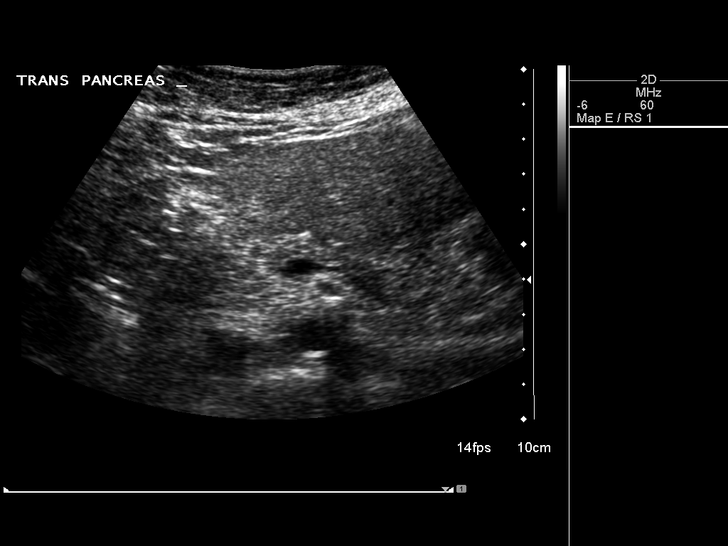
[im 8/89]
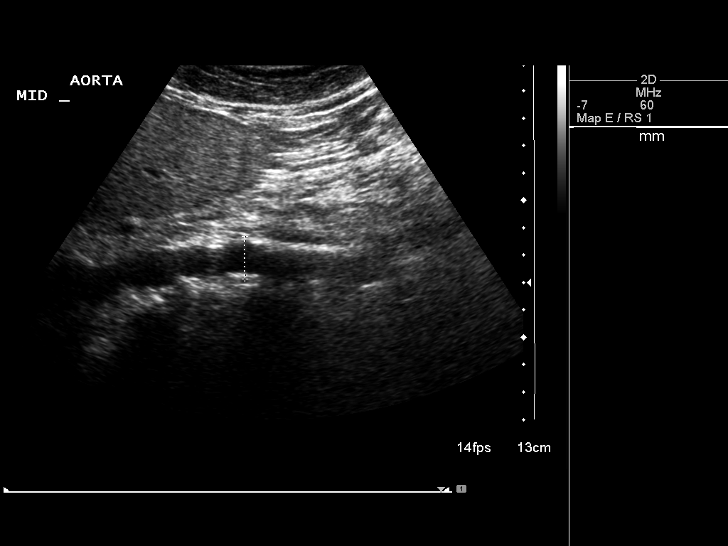
[im 15/89]
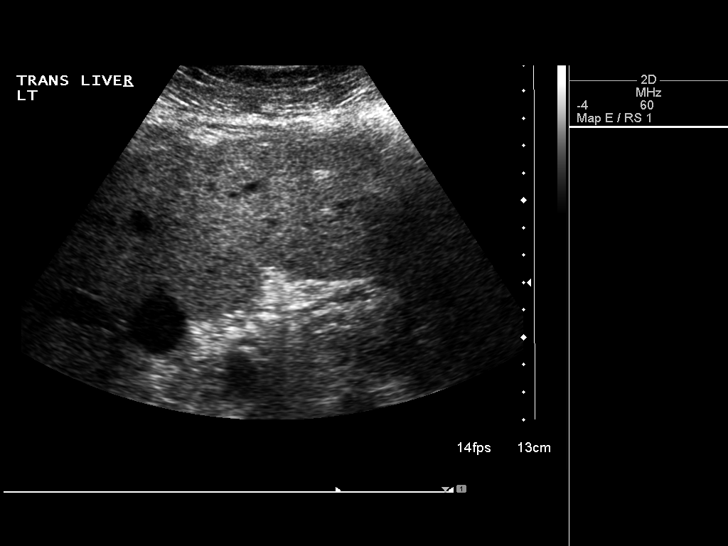
[im 23/89]
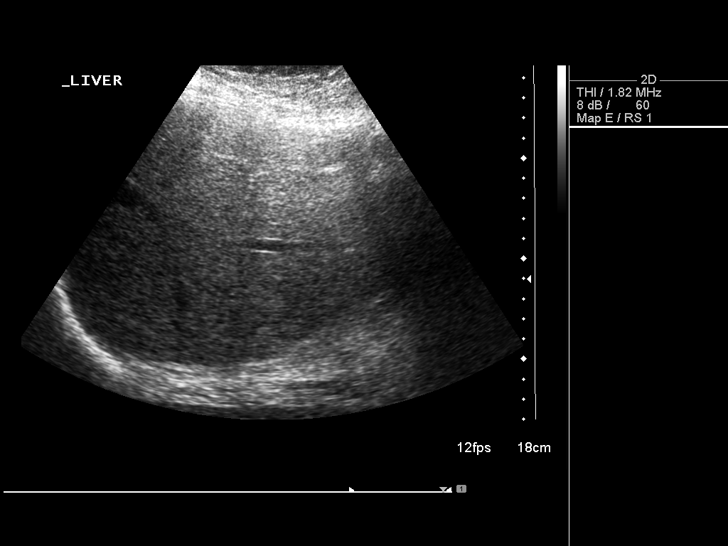
[im 30/89]
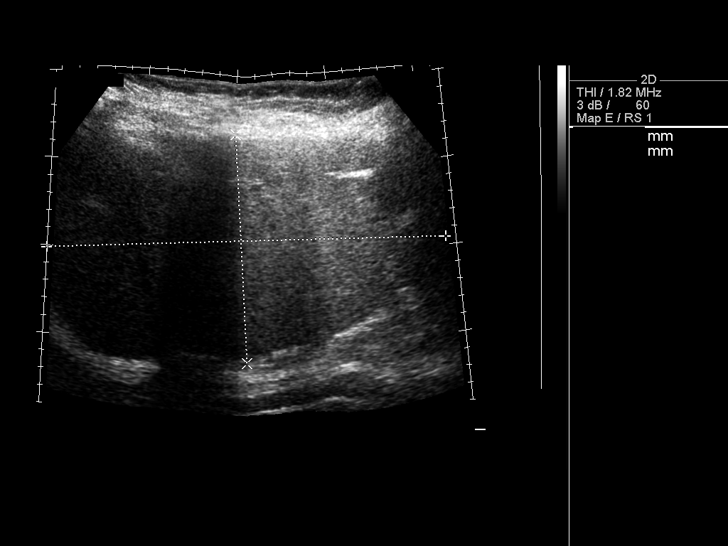
[im 34/89]
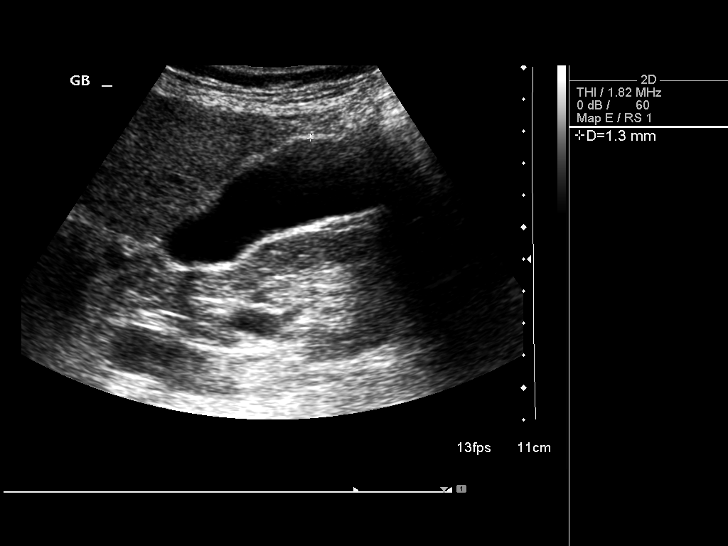
[im 41/89]
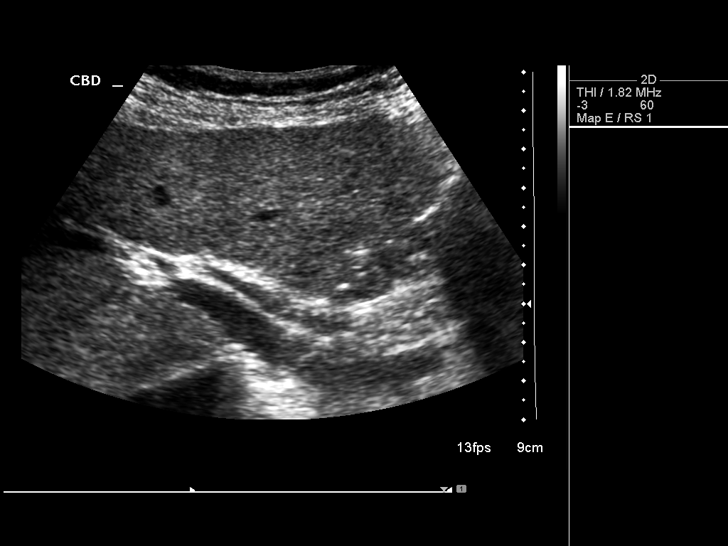
[im 48/89]
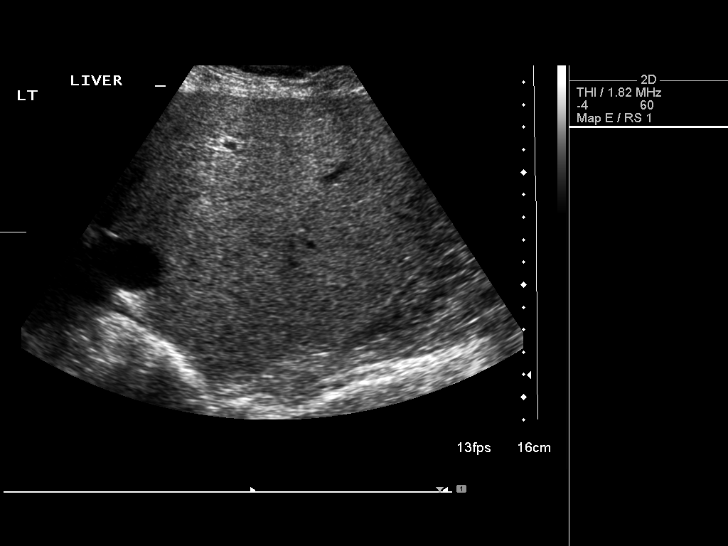
[im 56/89]
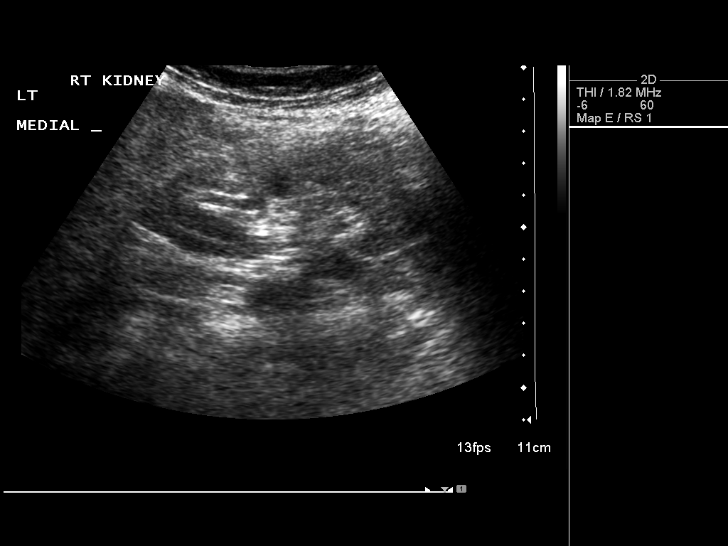
[im 59/89]
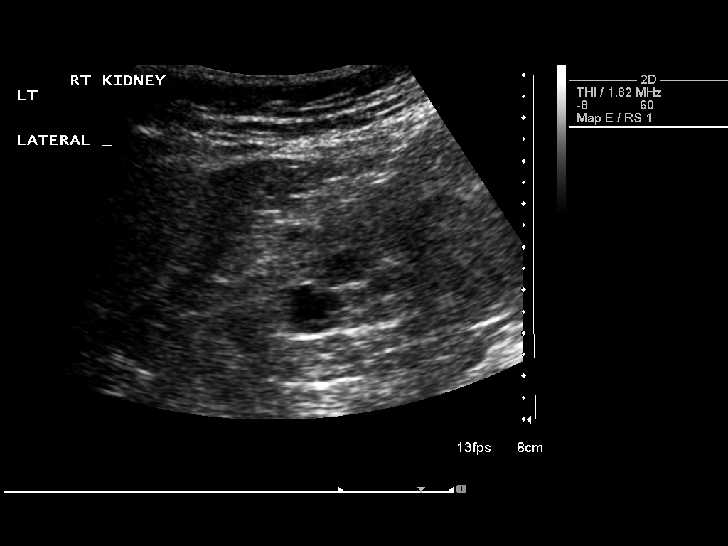
[im 67/89]
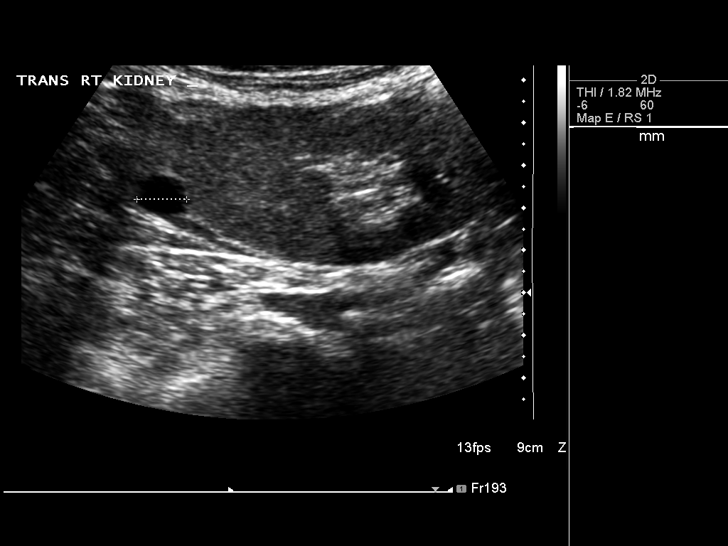
[im 74/89]
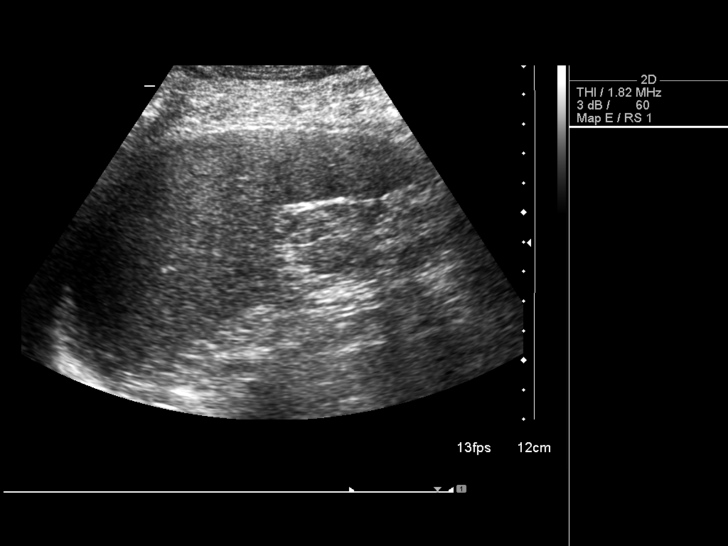
[im 81/89]
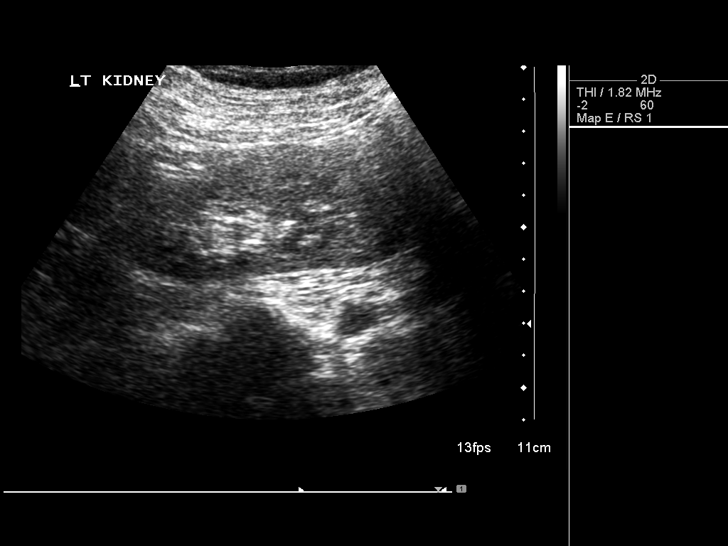
[im 89/89]
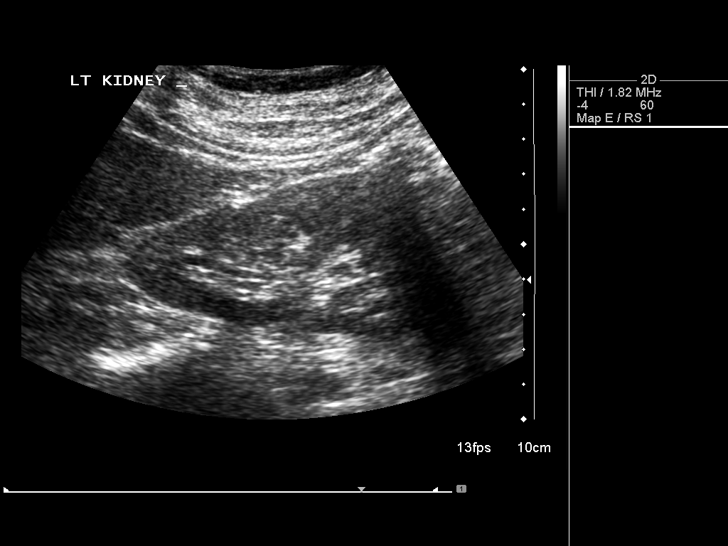

[14 of 25 positions shown; findings below may reference images not displayed]

FINDINGS: Gallbladder:  The gallbladder is visualized and no gallstones are
noted.  There is no pain over the gallbladder with compression.

Common bile duct:  The common bile duct is normal measuring 4.6 mm
in diameter.

Liver:  The liver is enlarged measuring at least 22.8 cm
sagittally.  The liver is also echogenic consistent with fatty
infiltration.  No focal abnormality is seen.

IVC:  Appears normal.

Pancreas:  Portions of the pancreas are obscured by bowel gas.

Spleen:  The spleen is normal measuring 12.4 cm sagittally with
cm splenule noted.

Right Kidney:  No hydronephrosis is seen.  The right kidney
measures 10.0 cm sagittally.  Two small cysts are present laterally
of no more than 1.2 cm in diameter.

Left Kidney:  No hydronephrosis is noted.  The left kidney measures
10.7 cm.

Abdominal aorta:  The abdominal aorta is normal in caliber with
mild atheromatous change present.
IMPRESSION: 1.  Hepatomegaly with fatty infiltration.  No focal abnormality.
2.  The pancreas is obscured by bowel gas.
3.  No gallstones.

## 2014-05-28 ENCOUNTER — Encounter (HOSPITAL_COMMUNITY): Payer: Self-pay | Admitting: Emergency Medicine

## 2014-05-28 ENCOUNTER — Inpatient Hospital Stay (HOSPITAL_COMMUNITY)
Admission: EM | Admit: 2014-05-28 | Discharge: 2014-05-31 | DRG: 086 | Disposition: A | Discharge status: Home / Self Care | Payer: Medicare Other | Attending: Internal Medicine | Admitting: Internal Medicine

## 2014-05-28 ENCOUNTER — Emergency Department (HOSPITAL_COMMUNITY): Payer: Medicare Other

## 2014-05-28 DIAGNOSIS — S0990XA Unspecified injury of head, initial encounter: Secondary | ICD-10-CM

## 2014-05-28 DIAGNOSIS — Z888 Allergy status to other drugs, medicaments and biological substances status: Secondary | ICD-10-CM

## 2014-05-28 DIAGNOSIS — J4489 Other specified chronic obstructive pulmonary disease: Secondary | ICD-10-CM

## 2014-05-28 DIAGNOSIS — I4891 Unspecified atrial fibrillation: Secondary | ICD-10-CM

## 2014-05-28 DIAGNOSIS — Z882 Allergy status to sulfonamides status: Secondary | ICD-10-CM | POA: Diagnosis not present

## 2014-05-28 DIAGNOSIS — S065X0A Traumatic subdural hemorrhage without loss of consciousness, initial encounter: Secondary | ICD-10-CM

## 2014-05-28 DIAGNOSIS — W19XXXA Unspecified fall, initial encounter: Secondary | ICD-10-CM

## 2014-05-28 DIAGNOSIS — Z96659 Presence of unspecified artificial knee joint: Secondary | ICD-10-CM

## 2014-05-28 DIAGNOSIS — F3289 Other specified depressive episodes: Secondary | ICD-10-CM

## 2014-05-28 DIAGNOSIS — R413 Other amnesia: Secondary | ICD-10-CM

## 2014-05-28 DIAGNOSIS — S0633AA Contusion and laceration of cerebrum, unspecified, with loss of consciousness status unknown, initial encounter: Principal | ICD-10-CM | POA: Diagnosis present

## 2014-05-28 DIAGNOSIS — Z87891 Personal history of nicotine dependence: Secondary | ICD-10-CM

## 2014-05-28 DIAGNOSIS — E119 Type 2 diabetes mellitus without complications: Secondary | ICD-10-CM | POA: Diagnosis present

## 2014-05-28 DIAGNOSIS — F4323 Adjustment disorder with mixed anxiety and depressed mood: Secondary | ICD-10-CM

## 2014-05-28 DIAGNOSIS — W19XXXS Unspecified fall, sequela: Secondary | ICD-10-CM

## 2014-05-28 DIAGNOSIS — R0603 Acute respiratory distress: Secondary | ICD-10-CM

## 2014-05-28 DIAGNOSIS — Z950 Presence of cardiac pacemaker: Secondary | ICD-10-CM

## 2014-05-28 DIAGNOSIS — E43 Unspecified severe protein-calorie malnutrition: Secondary | ICD-10-CM

## 2014-05-28 DIAGNOSIS — W1809XA Striking against other object with subsequent fall, initial encounter: Secondary | ICD-10-CM | POA: Diagnosis present

## 2014-05-28 DIAGNOSIS — S065XAA Traumatic subdural hemorrhage with loss of consciousness status unknown, initial encounter: Secondary | ICD-10-CM | POA: Insufficient documentation

## 2014-05-28 DIAGNOSIS — I119 Hypertensive heart disease without heart failure: Secondary | ICD-10-CM

## 2014-05-28 DIAGNOSIS — I422 Other hypertrophic cardiomyopathy: Secondary | ICD-10-CM | POA: Diagnosis present

## 2014-05-28 DIAGNOSIS — E1165 Type 2 diabetes mellitus with hyperglycemia: Secondary | ICD-10-CM | POA: Diagnosis present

## 2014-05-28 DIAGNOSIS — M1990 Unspecified osteoarthritis, unspecified site: Secondary | ICD-10-CM | POA: Diagnosis present

## 2014-05-28 DIAGNOSIS — I272 Pulmonary hypertension, unspecified: Secondary | ICD-10-CM

## 2014-05-28 DIAGNOSIS — I509 Heart failure, unspecified: Secondary | ICD-10-CM | POA: Diagnosis present

## 2014-05-28 DIAGNOSIS — Z9181 History of falling: Secondary | ICD-10-CM | POA: Diagnosis not present

## 2014-05-28 DIAGNOSIS — I2789 Other specified pulmonary heart diseases: Secondary | ICD-10-CM | POA: Diagnosis present

## 2014-05-28 DIAGNOSIS — Z8673 Personal history of transient ischemic attack (TIA), and cerebral infarction without residual deficits: Secondary | ICD-10-CM

## 2014-05-28 DIAGNOSIS — J9610 Chronic respiratory failure, unspecified whether with hypoxia or hypercapnia: Secondary | ICD-10-CM | POA: Diagnosis present

## 2014-05-28 DIAGNOSIS — E7800 Pure hypercholesterolemia, unspecified: Secondary | ICD-10-CM | POA: Diagnosis present

## 2014-05-28 DIAGNOSIS — I10 Essential (primary) hypertension: Secondary | ICD-10-CM | POA: Diagnosis present

## 2014-05-28 DIAGNOSIS — Z880 Allergy status to penicillin: Secondary | ICD-10-CM

## 2014-05-28 DIAGNOSIS — R059 Cough, unspecified: Secondary | ICD-10-CM

## 2014-05-28 DIAGNOSIS — E1169 Type 2 diabetes mellitus with other specified complication: Secondary | ICD-10-CM

## 2014-05-28 DIAGNOSIS — Z8249 Family history of ischemic heart disease and other diseases of the circulatory system: Secondary | ICD-10-CM | POA: Diagnosis not present

## 2014-05-28 DIAGNOSIS — S06339A Contusion and laceration of cerebrum, unspecified, with loss of consciousness of unspecified duration, initial encounter: Secondary | ICD-10-CM

## 2014-05-28 DIAGNOSIS — Z881 Allergy status to other antibiotic agents status: Secondary | ICD-10-CM

## 2014-05-28 DIAGNOSIS — E785 Hyperlipidemia, unspecified: Secondary | ICD-10-CM

## 2014-05-28 DIAGNOSIS — I482 Chronic atrial fibrillation, unspecified: Secondary | ICD-10-CM

## 2014-05-28 DIAGNOSIS — IMO0002 Reserved for concepts with insufficient information to code with codable children: Secondary | ICD-10-CM | POA: Diagnosis present

## 2014-05-28 DIAGNOSIS — S065X9A Traumatic subdural hemorrhage with loss of consciousness of unspecified duration, initial encounter: Secondary | ICD-10-CM | POA: Insufficient documentation

## 2014-05-28 DIAGNOSIS — Z9981 Dependence on supplemental oxygen: Secondary | ICD-10-CM

## 2014-05-28 DIAGNOSIS — S1093XA Contusion of unspecified part of neck, initial encounter: Secondary | ICD-10-CM | POA: Diagnosis not present

## 2014-05-28 DIAGNOSIS — R269 Unspecified abnormalities of gait and mobility: Secondary | ICD-10-CM

## 2014-05-28 DIAGNOSIS — R0602 Shortness of breath: Secondary | ICD-10-CM

## 2014-05-28 DIAGNOSIS — G629 Polyneuropathy, unspecified: Secondary | ICD-10-CM

## 2014-05-28 DIAGNOSIS — W102XXA Fall (on)(from) incline, initial encounter: Secondary | ICD-10-CM

## 2014-05-28 DIAGNOSIS — Z791 Long term (current) use of non-steroidal anti-inflammatories (NSAID): Secondary | ICD-10-CM | POA: Diagnosis not present

## 2014-05-28 DIAGNOSIS — K219 Gastro-esophageal reflux disease without esophagitis: Secondary | ICD-10-CM | POA: Diagnosis present

## 2014-05-28 DIAGNOSIS — F341 Dysthymic disorder: Secondary | ICD-10-CM | POA: Diagnosis present

## 2014-05-28 DIAGNOSIS — J810 Acute pulmonary edema: Secondary | ICD-10-CM

## 2014-05-28 DIAGNOSIS — IMO0001 Reserved for inherently not codable concepts without codable children: Secondary | ICD-10-CM

## 2014-05-28 DIAGNOSIS — J309 Allergic rhinitis, unspecified: Secondary | ICD-10-CM

## 2014-05-28 DIAGNOSIS — R2681 Unsteadiness on feet: Secondary | ICD-10-CM | POA: Diagnosis present

## 2014-05-28 DIAGNOSIS — R05 Cough: Secondary | ICD-10-CM

## 2014-05-28 DIAGNOSIS — E1149 Type 2 diabetes mellitus with other diabetic neurological complication: Secondary | ICD-10-CM

## 2014-05-28 DIAGNOSIS — S06330A Contusion and laceration of cerebrum, unspecified, without loss of consciousness, initial encounter: Secondary | ICD-10-CM

## 2014-05-28 DIAGNOSIS — S0003XA Contusion of scalp, initial encounter: Secondary | ICD-10-CM | POA: Diagnosis present

## 2014-05-28 DIAGNOSIS — E876 Hypokalemia: Secondary | ICD-10-CM

## 2014-05-28 DIAGNOSIS — F329 Major depressive disorder, single episode, unspecified: Secondary | ICD-10-CM

## 2014-05-28 DIAGNOSIS — E162 Hypoglycemia, unspecified: Secondary | ICD-10-CM | POA: Diagnosis present

## 2014-05-28 DIAGNOSIS — R079 Chest pain, unspecified: Secondary | ICD-10-CM

## 2014-05-28 DIAGNOSIS — J449 Chronic obstructive pulmonary disease, unspecified: Secondary | ICD-10-CM | POA: Diagnosis present

## 2014-05-28 DIAGNOSIS — R55 Syncope and collapse: Secondary | ICD-10-CM

## 2014-05-28 DIAGNOSIS — I340 Nonrheumatic mitral (valve) insufficiency: Secondary | ICD-10-CM

## 2014-05-28 LAB — BASIC METABOLIC PANEL
Anion gap: 20 — ABNORMAL HIGH (ref 5–15)
BUN: 23 mg/dL (ref 6–23)
CALCIUM: 9.9 mg/dL (ref 8.4–10.5)
CO2: 24 mEq/L (ref 19–32)
Chloride: 93 mEq/L — ABNORMAL LOW (ref 96–112)
Creatinine, Ser: 0.84 mg/dL (ref 0.50–1.10)
GFR calc Af Amer: 77 mL/min — ABNORMAL LOW (ref >90)
GFR, EST NON AFRICAN AMERICAN: 66 mL/min — AB (ref >90)
Glucose, Bld: 217 mg/dL — ABNORMAL HIGH (ref 70–99)
Potassium: 3.3 mEq/L — ABNORMAL LOW (ref 3.7–5.3)
SODIUM: 137 meq/L (ref 137–147)

## 2014-05-28 LAB — CBG MONITORING, ED
Glucose-Capillary: 187 mg/dL — ABNORMAL HIGH (ref 70–99)
Glucose-Capillary: 214 mg/dL — ABNORMAL HIGH (ref 70–99)

## 2014-05-28 LAB — I-STAT CHEM 8, ED
BUN: 24 mg/dL — ABNORMAL HIGH (ref 6–23)
CREATININE: 0.9 mg/dL (ref 0.50–1.10)
Calcium, Ion: 1.21 mmol/L (ref 1.13–1.30)
Chloride: 97 mEq/L (ref 96–112)
Glucose, Bld: 228 mg/dL — ABNORMAL HIGH (ref 70–99)
HCT: 45 % (ref 36.0–46.0)
HEMOGLOBIN: 15.3 g/dL — AB (ref 12.0–15.0)
Potassium: 3.1 mEq/L — ABNORMAL LOW (ref 3.7–5.3)
SODIUM: 140 meq/L (ref 137–147)
TCO2: 26 mmol/L (ref 0–100)

## 2014-05-28 LAB — CBC
HCT: 42.9 % (ref 36.0–46.0)
HEMOGLOBIN: 14.3 g/dL (ref 12.0–15.0)
MCH: 32.2 pg (ref 26.0–34.0)
MCHC: 33.3 g/dL (ref 30.0–36.0)
MCV: 96.6 fL (ref 78.0–100.0)
Platelets: 234 10*3/uL (ref 150–400)
RBC: 4.44 MIL/uL (ref 3.87–5.11)
RDW: 13.7 % (ref 11.5–15.5)
WBC: 7.6 10*3/uL (ref 4.0–10.5)

## 2014-05-28 MED ORDER — HYDROCODONE-ACETAMINOPHEN 5-325 MG PO TABS
1.0000 | ORAL_TABLET | Freq: Once | ORAL | Status: AC
  Administered 2014-05-28: 1 via ORAL
  Filled 2014-05-28: qty 1

## 2014-05-28 NOTE — ED Notes (Signed)
Pt wants something for pain.  Advised PA of pain med request and HR 129

## 2014-05-28 NOTE — ED Notes (Signed)
Pt called sister Mertis Pegram at [phone] to update her.

## 2014-05-28 NOTE — ED Provider Notes (Signed)
Medical screening examination/treatment/procedure(s) were conducted as a shared visit with non-physician practitioner(s) and myself.  I personally evaluated the patient during the encounter.   EKG Interpretation   Date/Time:  Saturday May 28 2014 20:21:31 EDT Ventricular Rate:  111 PR Interval:    QRS Duration: 106 QT Interval:  336 QTC Calculation: 457 R Axis:   54 Text Interpretation:  Atrial fibrillation Repol abnrm, severe global  ischemia (LM/MVD) No significant change since last tracing Confirmed by  [facility]  MD, Jonny RuizJOHN ([id number]) on 05/28/2014 8:29:06 PM     Left forehead hematoma; chronic atrial fibrillation with chronic history of multiple falls; amnesia for the event with no neck pain no chest pain no palpitations denies focal weakness or numbness and cervical spine is nontender  Hurman HornJohn M Huey Scalia, MD 05/29/14 1303

## 2014-05-28 NOTE — ED Provider Notes (Signed)
CSN: [id number]     Arrival date & time 05/28/14  2017 History   First MD Initiated Contact with Patient 05/28/14 2025     Chief Complaint  Patient presents with  . Fall  . Head Injury     (Consider location/radiation/quality/duration/timing/severity/associated sxs/prior Treatment) HPI There is the patient, states, that she was walking from her bed to the bathroom, and she hit the left side of her head against the wall.  The patient, states, that she has some mild nausea, and headache.  The patient denies chest pain, shortness of breath, vomiting, weakness, dizziness, back pain, neck pain, fever, dysuria, abdominal pain, or syncope.  He should states she did not lose consciousness during the fall.  Patient is complaining of some mild headache Past Medical History  Diagnosis Date  . Hypertrophic cardiomyopathy     a. 11/2013 Echo: EF 55-60%, basal inf HK, sev dil LA, no evidence of HCM.  PASP 60mmHg.  [name]. Hypertension   . Situational mixed anxiety and depressive disorder   . Hypercholesterolemia   . Falls frequently     coumadin stopped  . Pulmonary HTN     a. has had prior right heart cath in 2010; was felt that most likely due to elevated left sided pressures and would not benefit from vasodilator therapy;  b. 11/2013 Echo: PASP 60mmHg.  [name]. Oxygen dependent     "3L 24/7" (08/05/2013)  . COPD (chronic obstructive pulmonary disease)   . TIA (transient ischemic attack)   . Atrial fibrillation     a. Chronic; No longer on coumadin 2/2 frequent falls.  . CHF (congestive heart failure)   . Pacemaker     a. 04/2012 MDT [id number] [employer] PPM, ser #: [id number] H.  . Varicose veins   . History of pneumonia     "couple times; long time ago" (08/05/2013)  . Type II diabetes mellitus   . Anemia     "as a child" (08/05/2013)  . GERD (gastroesophageal reflux disease)   . H/O hiatal hernia   . Osteoarthritis   . Arthritis     "all over me; in all my joints" (08/05/2013)  . Coccyx pain   . Gout    "right foot" (08/05/2013)  . Anxiety   . Depression   . Chest pain     a. 08/2011 Myoview: sm, fixed anteroseptal defect (attenuation), no ischemia, EF 62%.   Past Surgical History  Procedure Laterality Date  . Total knee arthroplasty Right 2011  . Tonsillectomy    . Appendectomy    . Abdominal hysterectomy      "partial" (08/05/2013)  . Dilation and curettage of uterus      "several" (08/05/2013)  . Foot neuroma surgery Right   . Insert / replace / remove pacemaker  1998; 2000's; 2014  . Cardiac catheterization      "more than once" (08/05/2013)  . Back surgery      "bone spur removed; mid back" (08/05/2013)   Family History  Problem Relation Age of Onset  . Other Father     died in plane crash  . Cancer Mother     lymphoma-NHL  . Coronary artery disease Grandchild   . Heart disease Maternal Grandmother    History  Substance Use Topics  . Smoking status: Former Smoker -- 1.00 packs/day for 40 years    Types: Cigarettes    Quit date: 11/15/2005  . Smokeless tobacco: Never Used  . Alcohol Use: No   OB History  Grav Para Term Preterm Abortions TAB SAB Ect Mult Living                 Review of Systems   All other systems negative except as documented in the HPI. All pertinent positives and negatives as reviewed in the HPI. Allergies  Aspirin; Calan; Crestor; Escitalopram oxalate; Lipitor; Lopressor; Nitroglycerin; Norvasc; Nsaids; Oxycodone; Prednisone; Sulfa antibiotics; Vimovo; and Penicillins  Home Medications   Prior to Admission medications   Medication Sig Start Date End Date Taking? Authorizing Provider  budesonide-formoterol (SYMBICORT) 160-4.5 MCG/ACT inhaler Inhale 2 puffs into the lungs 2 (two) times daily.   Yes Historical Provider, MD  buPROPion (WELLBUTRIN XL) 150 MG 24 hr tablet Take 450 mg by mouth daily.    Yes Historical Provider, MD  diltiazem (CARDIZEM CD) 240 MG 24 hr capsule Take 1 capsule (240 mg total) by mouth daily. 01/07/14  Yes Cassell Clement, MD  hydrOXYzine (VISTARIL) 25 MG capsule Take 25 mg by mouth daily. At noon. And every 8 hours as needed for anxiety   Yes Historical Provider, MD  losartan (COZAAR) 50 MG tablet Take 50 mg by mouth daily.   Yes Historical Provider, MD  metFORMIN (GLUCOPHAGE) 500 MG tablet Take 500 mg by mouth 2 (two) times daily with a meal.   Yes Historical Provider, MD  Multiple Vitamins-Iron (MULTIVITAMINS WITH IRON) TABS tablet Take 1 tablet by mouth daily.   Yes Historical Provider, MD  sertraline (ZOLOFT) 100 MG tablet Take 200 mg by mouth daily.    Yes Historical Provider, MD  acetaminophen (TYLENOL) 500 MG tablet Take 500 mg by mouth every 6 (six) hours as needed for moderate pain.    Historical Provider, MD  albuterol (PROVENTIL HFA;VENTOLIN HFA) 108 (90 BASE) MCG/ACT inhaler Inhale 2 puffs into the lungs every 4 (four) hours as needed (cough).    Historical Provider, MD  calcium carbonate (TUMS - DOSED IN MG ELEMENTAL CALCIUM) 500 MG chewable tablet Chew 1 tablet by mouth 4 (four) times daily as needed for indigestion. Only as needed    Historical Provider, MD  famotidine (PEPCID) 20 MG tablet Take 20 mg by mouth daily as needed for indigestion.    Historical Provider, MD  fluticasone (FLONASE) 50 MCG/ACT nasal spray Place 1 spray into both nostrils daily as needed for allergies.     Historical Provider, MD  furosemide (LASIX) 20 MG tablet Take 20 mg by mouth daily as needed (swelling).    Historical Provider, MD  HYDROcodone-acetaminophen (NORCO/VICODIN) 5-325 MG per tablet Take 1 tablet by mouth every 4 (four) hours as needed for moderate pain.    Historical Provider, MD  loperamide (IMODIUM) 2 MG capsule Take 2 mg by mouth as needed for diarrhea or loose stools.    Historical Provider, MD  potassium chloride (KLOR-CON) 20 MEQ packet Take 20 mEq by mouth daily as needed (take with lasix).     Historical Provider, MD  temazepam (RESTORIL) 15 MG capsule Take 15 mg by mouth at bedtime as needed  for sleep.    Historical Provider, MD   BP 174/87  Pulse 118  Temp(Src) 97.8 F (36.6 C) (Oral)  Resp 17  SpO2 95% Physical Exam  Nursing note and vitals reviewed. Constitutional: She is oriented to person, place, and time. She appears well-developed and well-nourished.  HENT:  Head: Normocephalic.    Mouth/Throat: Oropharynx is clear and moist.  Eyes: Pupils are equal, round, and reactive to light.  Neck: Normal range of motion. Neck  supple.  Cardiovascular: Normal rate, regular rhythm and normal heart sounds.  Exam reveals no gallop and no friction rub.   No murmur heard. Pulmonary/Chest: Effort normal and breath sounds normal.  Neurological: She is alert and oriented to person, place, and time. She exhibits normal muscle tone. Coordination normal.  Skin: Skin is warm and dry.    ED Course  Procedures (including critical care time) Labs Review Labs Reviewed  BASIC METABOLIC PANEL - Abnormal; Notable for the following:    Potassium 3.3 (*)    Chloride 93 (*)    Glucose, Bld 217 (*)    GFR calc non Af Amer 66 (*)    GFR calc Af Amer 77 (*)    Anion gap 20 (*)    All other components within normal limits  CBG MONITORING, ED - Abnormal; Notable for the following:    Glucose-Capillary 214 (*)    All other components within normal limits  I-STAT CHEM 8, ED - Abnormal; Notable for the following:    Potassium 3.1 (*)    BUN 24 (*)    Glucose, Bld 228 (*)    Hemoglobin 15.3 (*)    All other components within normal limits  CBG MONITORING, ED - Abnormal; Notable for the following:    Glucose-Capillary 187 (*)    All other components within normal limits  CBC    Imaging Review Ct Head Wo Contrast  05/28/2014   CLINICAL DATA:  Status post fall, large frontal hematoma  EXAM: CT HEAD WITHOUT CONTRAST  TECHNIQUE: Contiguous axial images were obtained from the base of the skull through the vertex without intravenous contrast.  COMPARISON:  05/21/2014  FINDINGS: There is a  small amount of blood along the right side of the tentorium concerning for a small amount of subdural blood.  There is no evidence of mass effect, midline shift, or extra-axial fluid collections. There is no evidence of a space-occupying lesion. There is no evidence of a cortical-based area of acute infarction. There is generalized cerebral atrophy. There is periventricular white matter low attenuation likely secondary to microangiopathy.  The ventricles and sulci are appropriate for the patient's age. The basal cisterns are patent.  Visualized portions of the orbits are unremarkable. The visualized portions of the paranasal sinuses and mastoid air cells are unremarkable.  The osseous structures are unremarkable. There is a large left frontal scalp hematoma.  IMPRESSION: 1. Small amount of subdural hemorrhage along the right side of the tentorium. These results were called by telephone at the time of interpretation on 05/28/2014 at 9:20 pm to Dr. Wayland Salinas , who verbally acknowledged these results. 2. Large left frontal scalp hematoma.   Electronically Signed   By: Elige Ko   On: 05/28/2014 21:20     EKG Interpretation   Date/Time:  Saturday May 28 2014 20:21:31 EDT Ventricular Rate:  111 PR Interval:    QRS Duration: 106 QT Interval:  336 QTC Calculation: 457 R Axis:   54 Text Interpretation:  Atrial fibrillation Repol abnrm, severe global  ischemia (LM/MVD) No significant change since last tracing Confirmed by  [facility]  MD, Jonny Ruiz ([id number]) on 05/28/2014 8:29:06 PM      I spoke with neurosurgery about the patient.  Also spoke with the, [facility], who will be down admit the patient    Carlyle Dolly, PA-C 05/28/14 2328

## 2014-05-28 NOTE — ED Notes (Signed)
Obtained verbal order for pain med.

## 2014-05-28 NOTE — H&P (Signed)
PCP: Lupe Carney, MD    Chief Complaint:  Head ache  HPI: [name] [name] is a 78 y.o. female   has a past medical history of Hypertrophic cardiomyopathy; Hypertension; Situational mixed anxiety and depressive disorder; Hypercholesterolemia; Falls frequently; Pulmonary HTN; Oxygen dependent; COPD (chronic obstructive pulmonary disease); TIA (transient ischemic attack); Atrial fibrillation; CHF (congestive heart failure); Pacemaker; Varicose veins; History of pneumonia; Type II diabetes mellitus; Anemia; GERD (gastroesophageal reflux disease); H/O hiatal hernia; Osteoarthritis; Arthritis; Coccyx pain; Gout; Anxiety; Depression; and Chest pain.   Presented with  Patient woke up this AM and went to use the bathroom but hit her forehead on the door. She bounced off and hit the floor. Patient currently resides at the assisted living [facility]. Patient was having severe headache and nausea. EMS was called and she was brought to ER. In route it was noted that patient blood glucose was down to 45 she was given D50. In ER CT scan done showed small subdural hematoma. NS have been consulted and recommended admission and repeat CT in AM. Patient has hx of frequent falls. She has A.fib but not on anticoagulation due to falls. She denies any chest pain no syncope. Although in the past she have had episodes of amnesia. In ER HR noted to be 110-120 range. patient states she took her medications today.   Hospitalist was called for admission for subdural hematoma  Review of Systems:    Pertinent positives include: headache, nausea,  Constitutional:  No weight loss, night sweats, Fevers, chills, fatigue, weight loss  HEENT:  No headaches, Difficulty swallowing,Tooth/dental problems,Sore throat,  No sneezing, itching, ear ache, nasal congestion, post nasal drip,  Cardio-vascular:  No chest pain, Orthopnea, PND, anasarca, dizziness, palpitations.no Bilateral lower extremity swelling  GI:  No  heartburn, indigestion, abdominal pain,  vomiting, diarrhea, change in bowel habits, loss of appetite, melena, blood in stool, hematemesis Resp:  no shortness of breath at rest. No dyspnea on exertion, No excess mucus, no productive cough, No non-productive cough, No coughing up of blood.No change in color of mucus.No wheezing. Skin:  no rash or lesions. No jaundice GU:  no dysuria, change in color of urine, no urgency or frequency. No straining to urinate.  No flank pain.  Musculoskeletal:  No joint pain or no joint swelling. No decreased range of motion. No back pain.  Psych:  No change in mood or affect. No depression or anxiety. No memory loss.  Neuro: no localizing neurological complaints, no tingling, no weakness, no double vision, no gait abnormality, no slurred speech, no confusion  Otherwise ROS are negative except for above, 10 systems were reviewed  Past Medical History: Past Medical History  Diagnosis Date  . Hypertrophic cardiomyopathy     a. 11/2013 Echo: EF 55-60%, basal inf HK, sev dil LA, no evidence of HCM.  PASP .  [name] Hypertension   . Situational mixed anxiety and depressive disorder   . Hypercholesterolemia   . Falls frequently     coumadin stopped  . Pulmonary HTN     a. has had prior right heart cath in 2010; was felt that most likely due to elevated left sided pressures and would not benefit from vasodilator therapy;  b. 11/2013 Echo: PASP .  [name] Oxygen dependent     "3L 24/7" (08/05/2013)  . COPD (chronic obstructive pulmonary disease)   . TIA (transient ischemic attack)   . Atrial fibrillation     a. Chronic; No longer on coumadin 2/2 frequent  falls.  . CHF (congestive heart failure)   . Pacemaker     a. 04/2012 MDT [id number] [employer] PPM, ser #: [id number] H.  . Varicose veins   . History of pneumonia     "couple times; long time ago" (08/05/2013)  . Type II diabetes mellitus   . Anemia     "as a child" (08/05/2013)  . GERD (gastroesophageal reflux  disease)   . H/O hiatal hernia   . Osteoarthritis   . Arthritis     "all over me; in all my joints" (08/05/2013)  . Coccyx pain   . Gout     "right foot" (08/05/2013)  . Anxiety   . Depression   . Chest pain     a. 08/2011 Myoview: sm, fixed anteroseptal defect (attenuation), no ischemia, EF 62%.   Past Surgical History  Procedure Laterality Date  . Total knee arthroplasty Right 2011  . Tonsillectomy    . Appendectomy    . Abdominal hysterectomy      "partial" (08/05/2013)  . Dilation and curettage of uterus      "several" (08/05/2013)  . Foot neuroma surgery Right   . Insert / replace / remove pacemaker  1998; 2000's; 2014  . Cardiac catheterization      "more than once" (08/05/2013)  . Back surgery      "bone spur removed; mid back" (08/05/2013)     Medications: Prior to Admission medications   Medication Sig Start Date End Date Taking? Authorizing Provider  budesonide-formoterol (SYMBICORT) 160-4.5 MCG/ACT inhaler Inhale 2 puffs into the lungs 2 (two) times daily.   Yes Historical Provider, MD  buPROPion (WELLBUTRIN XL) 150 MG 24 hr tablet Take 450 mg by mouth daily.    Yes Historical Provider, MD  diltiazem (CARDIZEM CD) 240 MG 24 hr capsule Take 1 capsule (240 mg total) by mouth daily. 01/07/14  Yes Cassell Clement, MD  hydrOXYzine (VISTARIL) 25 MG capsule Take 25 mg by mouth daily. At noon. And every 8 hours as needed for anxiety   Yes Historical Provider, MD  losartan (COZAAR) 50 MG tablet Take 50 mg by mouth daily.   Yes Historical Provider, MD  metFORMIN (GLUCOPHAGE) 500 MG tablet Take 500 mg by mouth 2 (two) times daily with a meal.   Yes Historical Provider, MD  Multiple Vitamins-Iron (MULTIVITAMINS WITH IRON) TABS tablet Take 1 tablet by mouth daily.   Yes Historical Provider, MD  sertraline (ZOLOFT) 100 MG tablet Take 200 mg by mouth daily.    Yes Historical Provider, MD  acetaminophen (TYLENOL) 500 MG tablet Take 500 mg by mouth every 6 (six) hours as needed for  moderate pain.    Historical Provider, MD  albuterol (PROVENTIL HFA;VENTOLIN HFA) 108 (90 BASE) MCG/ACT inhaler Inhale 2 puffs into the lungs every 4 (four) hours as needed (cough).    Historical Provider, MD  calcium carbonate (TUMS - DOSED IN MG ELEMENTAL CALCIUM) 500 MG chewable tablet Chew 1 tablet by mouth 4 (four) times daily as needed for indigestion. Only as needed    Historical Provider, MD  famotidine (PEPCID) 20 MG tablet Take 20 mg by mouth daily as needed for indigestion.    Historical Provider, MD  fluticasone (FLONASE) 50 MCG/ACT nasal spray Place 1 spray into both nostrils daily as needed for allergies.     Historical Provider, MD  furosemide (LASIX) 20 MG tablet Take 20 mg by mouth daily as needed (swelling).    Historical Provider, MD  HYDROcodone-acetaminophen (NORCO/VICODIN) 5-325 MG per  tablet Take 1 tablet by mouth every 4 (four) hours as needed for moderate pain.    Historical Provider, MD  loperamide (IMODIUM) 2 MG capsule Take 2 mg by mouth as needed for diarrhea or loose stools.    Historical Provider, MD  potassium chloride (KLOR-CON) 20 MEQ packet Take 20 mEq by mouth daily as needed (take with lasix).     Historical Provider, MD  temazepam (RESTORIL) 15 MG capsule Take 15 mg by mouth at bedtime as needed for sleep.    Historical Provider, MD    Allergies:   Allergies  Allergen Reactions  . Aspirin Nausea And Vomiting  . Calan [Verapamil Hcl] Other (See Comments)    Patient states it makes her "out of her mind"; bp bottoms out  . Crestor [Rosuvastatin Calcium] Other (See Comments)    Muscle pain  . Escitalopram Oxalate Other (See Comments)    Reaction Unknown   . Lipitor [Atorvastatin Calcium] Other (See Comments)    myalgia  . Lopressor [Metoprolol Tartrate] Other (See Comments)    Blood pressure bottoms out   . Nitroglycerin Other (See Comments)    Reaction unknown  . Norvasc [Amlodipine Besylate] Other (See Comments)    fatigue  . Nsaids Nausea Only  .  Oxycodone Other (See Comments)    Reaction unknown  . Prednisone Swelling  . Sulfa Antibiotics Nausea Only and Other (See Comments)    Makes her stomach hurt  . Vimovo [Naproxen-Esomeprazole] Nausea Only  . Penicillins Hives and Rash    Social History:  Ambulatory walker     From facility [facility] at [facility]   reports that she quit smoking about 8 years ago. Her smoking use included Cigarettes. She has a 40 pack-year smoking history. She has never used smokeless tobacco. She reports that she does not drink alcohol or use illicit drugs.    Family History: family history includes Cancer in her mother; Coronary artery disease in her grandchild; Heart disease in her maternal grandmother; Other in her father.    Physical Exam: Patient Vitals for the past 24 hrs:  BP Temp Temp src Pulse Resp SpO2  05/28/14 2230 174/87 mmHg - - 118 17 95 %  05/28/14 2215 186/99 mmHg - - 129 19 95 %  05/28/14 2200 189/82 mmHg - - 114 15 96 %  05/28/14 2049 178/122 mmHg - - 123 21 99 %  05/28/14 2045 178/122 mmHg - - 114 24 100 %  05/28/14 2012 161/104 mmHg 97.8 F (36.6 C) Oral 103 17 99 %    1. General:  in No Acute distress 2. Psychological: Alert and   Oriented 3. Head/ENT:    Dry Mucous Membranes                          Head   Traumatic with large Left frontal hematoma, neck supple                          Normal    Dentition 4. SKIN:   decreased Skin turgor,  Skin clean Dry and intact no rash 5. Heart: rapid irregular rate and rhythm no Murmur, Rub or gallop 6. Lungs: Clear to auscultation bilaterally, no wheezes or crackles   7. Abdomen: Soft, non-tender, Non distended 8. Lower extremities: no clubbing, cyanosis, or edema 9. Neurologically strength 5/5 in all 4 ext CN 2-12 intact PEARTL   10. MSK: Normal range of motion  body mass index is unknown because there is no weight on file.   Labs on Admission:   Recent Labs  05/28/14 2034 05/28/14 2041  NA 137 140  K 3.3* 3.1*    CL 93* 97  CO2 24  --   GLUCOSE 217* 228*  BUN 23 24*  CREATININE 0.84 0.90  CALCIUM 9.9  --    No results found for this basename: AST, ALT, ALKPHOS, BILITOT, PROT, ALBUMIN,  in the last 72 hours No results found for this basename: LIPASE, AMYLASE,  in the last 72 hours  Recent Labs  05/28/14 2034 05/28/14 2041  WBC 7.6  --   HGB 14.3 15.3*  HCT 42.9 45.0  MCV 96.6  --   PLT 234  --    No results found for this basename: CKTOTAL, CKMB, CKMBINDEX, TROPONINI,  in the last 72 hours No results found for this basename: TSH, T4TOTAL, FREET3, T3FREE, THYROIDAB,  in the last 72 hours No results found for this basename: VITAMINB12, FOLATE, FERRITIN, TIBC, IRON, RETICCTPCT,  in the last 72 hours Lab Results  Component Value Date   HGBA1C 6.4* 11/18/2013    The CrCl is unknown because both a height and weight (above a minimum accepted value) are required for this calculation. ABG    Component Value Date/Time   PHART 7.586* 05/07/2013 2211   HCO3 28.0* 05/07/2013 2211   TCO2 26 05/28/2014 2041   ACIDBASEDEF 4.0* 08/28/2009 1018   O2SAT 100.0 05/07/2013 2211     Lab Results  Component Value Date   DDIMER 0.36 05/08/2013     Other results:  I have pearsonaly reviewed this: ECG REPORT  Rate: 111  Rhythm: A.fib ST&T Change: ST depression in leads II, V3-v6  BNP (last 3 results) No results found for this basename: PROBNP,  in the last 8760 hours  There were no vitals filed for this visit.   Cultures:    Component Value Date/Time   SDES URINE, CLEAN CATCH 05/05/2014 1120   SPECREQUEST NONE 05/05/2014 1120   CULT  Value: NO GROWTH Performed at [facility] 05/05/2014 1120   REPTSTATUS 05/06/2014 FINAL 05/05/2014 1120   Radiological Exams on Admission: Ct Head Wo Contrast  05/28/2014   CLINICAL DATA:  Status post fall, large frontal hematoma  EXAM: CT HEAD WITHOUT CONTRAST  TECHNIQUE: Contiguous axial images were obtained from the base of the skull through the vertex  without intravenous contrast.  COMPARISON:  05/21/2014  FINDINGS: There is a small amount of blood along the right side of the tentorium concerning for a small amount of subdural blood.  There is no evidence of mass effect, midline shift, or extra-axial fluid collections. There is no evidence of a space-occupying lesion. There is no evidence of a cortical-based area of acute infarction. There is generalized cerebral atrophy. There is periventricular white matter low attenuation likely secondary to microangiopathy.  The ventricles and sulci are appropriate for the patient's age. The basal cisterns are patent.  Visualized portions of the orbits are unremarkable. The visualized portions of the paranasal sinuses and mastoid air cells are unremarkable.  The osseous structures are unremarkable. There is a large left frontal scalp hematoma.  IMPRESSION: 1. Small amount of subdural hemorrhage along the right side of the tentorium. These results were called by telephone at the time of interpretation on 05/28/2014 at 9:20 pm to Dr. Wayland Salinas , who verbally acknowledged these results. 2. Large left frontal scalp hematoma.   Electronically Signed  By: Elige KoHetal  Patel   On: 05/28/2014 21:20    Chart has been reviewed  Assessment/Plan  78 yo F with frequent falls with hx of A.fib not on anticoagulation here after a mechanical fall with small subdural  Present on Admission:  . Subdural hematoma - admit for q 2h neuro checks, Neurosurgery will see in AM, repeat CT of the head in 24 h.  . Type II or unspecified type diabetes mellitus without mention of complication, uncontrolled - SSI, hold metfromin . HYPERTENSION - continue home meds, PRN hydralazine . Atrial fibrillation - not a candidate for anticoagulation. Given somewhat  poor rate control will give diltiazem bolus and follow up HR.  [name]. Hypoglycemia - currently improved will continue to follow . Hypokalemia - will replace Hx of CHF hold lasix - gentle IVF given  evidence of being volume down   Prophylaxis: SCD, Protonix  CODE STATUS:  limited code  Other plan as per orders.  I have spent a total of 55 min on this admission  Les Longmore 05/28/2014, 11:39 PM  [facility]  Pager [phone]   If 7AM-7PM, please contact the day team taking care of the patient  [email or web address]  Password [email or web address]

## 2014-05-28 NOTE — ED Notes (Signed)
To room via EMS.  Pt walking from bed to bathroom fell hit left side of head against wall.  Unknown LOC.  Hematoma to left forehead.  Pt nauseated.  CBG 45, EMS gave 1 amp D50, CBG raised to 307.  Negative stroke screen.

## 2014-05-29 ENCOUNTER — Inpatient Hospital Stay (HOSPITAL_COMMUNITY): Payer: Medicare Other

## 2014-05-29 DIAGNOSIS — E162 Hypoglycemia, unspecified: Secondary | ICD-10-CM

## 2014-05-29 DIAGNOSIS — S065X0A Traumatic subdural hemorrhage without loss of consciousness, initial encounter: Secondary | ICD-10-CM

## 2014-05-29 DIAGNOSIS — I4891 Unspecified atrial fibrillation: Secondary | ICD-10-CM

## 2014-05-29 DIAGNOSIS — E876 Hypokalemia: Secondary | ICD-10-CM

## 2014-05-29 DIAGNOSIS — I10 Essential (primary) hypertension: Secondary | ICD-10-CM

## 2014-05-29 LAB — PHOSPHORUS: Phosphorus: 3.6 mg/dL (ref 2.3–4.6)

## 2014-05-29 LAB — HEMOGLOBIN A1C
Hgb A1c MFr Bld: 5.6 % (ref <5.7)
Mean Plasma Glucose: 114 mg/dL (ref <117)

## 2014-05-29 LAB — GLUCOSE, CAPILLARY
GLUCOSE-CAPILLARY: 118 mg/dL — AB (ref 70–99)
GLUCOSE-CAPILLARY: 78 mg/dL (ref 70–99)
Glucose-Capillary: 111 mg/dL — ABNORMAL HIGH (ref 70–99)
Glucose-Capillary: 139 mg/dL — ABNORMAL HIGH (ref 70–99)
Glucose-Capillary: 164 mg/dL — ABNORMAL HIGH (ref 70–99)
Glucose-Capillary: 165 mg/dL — ABNORMAL HIGH (ref 70–99)
Glucose-Capillary: 120 mg/dL — ABNORMAL HIGH (ref 70–99)

## 2014-05-29 LAB — URINALYSIS, ROUTINE W REFLEX MICROSCOPIC
BILIRUBIN URINE: NEGATIVE
Glucose, UA: NEGATIVE mg/dL
HGB URINE DIPSTICK: NEGATIVE
Ketones, ur: NEGATIVE mg/dL
Leukocytes, UA: NEGATIVE
Nitrite: NEGATIVE
PROTEIN: NEGATIVE mg/dL
Specific Gravity, Urine: 1.023 (ref 1.005–1.030)
UROBILINOGEN UA: 0.2 mg/dL (ref 0.0–1.0)
pH: 6 (ref 5.0–8.0)

## 2014-05-29 LAB — COMPREHENSIVE METABOLIC PANEL
ALK PHOS: 98 U/L (ref 39–117)
ALT: 10 U/L (ref 0–35)
AST: 26 U/L (ref 0–37)
Albumin: 4.2 g/dL (ref 3.5–5.2)
Anion gap: 17 — ABNORMAL HIGH (ref 5–15)
BUN: 21 mg/dL (ref 6–23)
CALCIUM: 9.6 mg/dL (ref 8.4–10.5)
CO2: 27 mEq/L (ref 19–32)
Chloride: 98 mEq/L (ref 96–112)
Creatinine, Ser: 0.83 mg/dL (ref 0.50–1.10)
GFR calc Af Amer: 78 mL/min — ABNORMAL LOW (ref >90)
GFR, EST NON AFRICAN AMERICAN: 67 mL/min — AB (ref >90)
Glucose, Bld: 118 mg/dL — ABNORMAL HIGH (ref 70–99)
Potassium: 3.3 mEq/L — ABNORMAL LOW (ref 3.7–5.3)
SODIUM: 142 meq/L (ref 137–147)
Total Bilirubin: 0.7 mg/dL (ref 0.3–1.2)
Total Protein: 7.3 g/dL (ref 6.0–8.3)

## 2014-05-29 LAB — CBC
HCT: 40.8 % (ref 36.0–46.0)
Hemoglobin: 13.5 g/dL (ref 12.0–15.0)
MCH: 31.8 pg (ref 26.0–34.0)
MCHC: 33.1 g/dL (ref 30.0–36.0)
MCV: 96 fL (ref 78.0–100.0)
PLATELETS: 248 10*3/uL (ref 150–400)
RBC: 4.25 MIL/uL (ref 3.87–5.11)
RDW: 13.9 % (ref 11.5–15.5)
WBC: 9.4 10*3/uL (ref 4.0–10.5)

## 2014-05-29 LAB — MRSA PCR SCREENING: MRSA by PCR: NEGATIVE

## 2014-05-29 LAB — TROPONIN I: Troponin I: <0.3 ng/mL (ref <0.30)

## 2014-05-29 LAB — MAGNESIUM: Magnesium: 1.7 mg/dL (ref 1.5–2.5)

## 2014-05-29 LAB — TSH: TSH: 3.51 u[IU]/mL (ref 0.350–4.500)

## 2014-05-29 MED ORDER — FLUTICASONE PROPIONATE 50 MCG/ACT NA SUSP: 1.0000 | Freq: Every day | NASAL | Status: DC | PRN

## 2014-05-29 MED ORDER — HYDROMORPHONE HCL PF 1 MG/ML IJ SOLN: 0.5000 mg | INTRAMUSCULAR | Status: DC | PRN

## 2014-05-29 MED ORDER — POTASSIUM CHLORIDE 10 MEQ/100ML IV SOLN
10.0000 meq | INTRAVENOUS | Status: AC
  Administered 2014-05-29 (×4): 10 meq via INTRAVENOUS
  Filled 2014-05-29 (×4): qty 100

## 2014-05-29 MED ORDER — MORPHINE SULFATE 2 MG/ML IJ SOLN
INTRAMUSCULAR | Status: AC
  Administered 2014-05-29: 2 mg via INTRAVENOUS
  Filled 2014-05-29: qty 1

## 2014-05-29 MED ORDER — ACETAMINOPHEN 500 MG PO TABS
500.0000 mg | ORAL_TABLET | Freq: Four times a day (QID) | ORAL | Status: DC | PRN
  Administered 2014-05-29: 500 mg via ORAL
  Filled 2014-05-29: qty 1

## 2014-05-29 MED ORDER — BUDESONIDE-FORMOTEROL FUMARATE 160-4.5 MCG/ACT IN AERO
2.0000 | INHALATION_SPRAY | Freq: Two times a day (BID) | RESPIRATORY_TRACT | Status: DC
  Administered 2014-05-29 – 2014-05-31 (×4): 2 via RESPIRATORY_TRACT
  Filled 2014-05-29 (×2): qty 6

## 2014-05-29 MED ORDER — DILTIAZEM HCL ER COATED BEADS 240 MG PO CP24
240.0000 mg | ORAL_CAPSULE | Freq: Every day | ORAL | Status: DC
  Administered 2014-05-29 – 2014-05-31 (×3): 240 mg via ORAL
  Filled 2014-05-29 (×3): qty 1

## 2014-05-29 MED ORDER — ACETAMINOPHEN 325 MG PO TABS: 650.0000 mg | ORAL_TABLET | Freq: Four times a day (QID) | ORAL | Status: DC | PRN

## 2014-05-29 MED ORDER — SODIUM CHLORIDE 0.9 % IJ SOLN
3.0000 mL | Freq: Two times a day (BID) | INTRAMUSCULAR | Status: DC
  Administered 2014-05-29 – 2014-05-31 (×4): 3 mL via INTRAVENOUS

## 2014-05-29 MED ORDER — DEXTROSE 50 % IV SOLN: 50.0000 mL | Freq: Once | INTRAVENOUS | Status: AC | PRN

## 2014-05-29 MED ORDER — GLUCOSE 40 % PO GEL: 1.0000 | ORAL | Status: DC | PRN

## 2014-05-29 MED ORDER — DILTIAZEM HCL 25 MG/5ML IV SOLN
10.0000 mg | Freq: Once | INTRAVENOUS | Status: AC
  Administered 2014-05-29: 10 mg via INTRAVENOUS
  Filled 2014-05-29: qty 5

## 2014-05-29 MED ORDER — SERTRALINE HCL 100 MG PO TABS
200.0000 mg | ORAL_TABLET | Freq: Every day | ORAL | Status: DC
  Administered 2014-05-29 – 2014-05-31 (×3): 200 mg via ORAL
  Filled 2014-05-29 (×3): qty 2

## 2014-05-29 MED ORDER — HYDRALAZINE HCL 20 MG/ML IJ SOLN
10.0000 mg | INTRAMUSCULAR | Status: DC | PRN
  Administered 2014-05-30: 10 mg via INTRAVENOUS
  Filled 2014-05-29 (×2): qty 1

## 2014-05-29 MED ORDER — ACETAMINOPHEN 650 MG RE SUPP: 650.0000 mg | Freq: Four times a day (QID) | RECTAL | Status: DC | PRN

## 2014-05-29 MED ORDER — ALBUTEROL SULFATE (2.5 MG/3ML) 0.083% IN NEBU: 3.0000 mL | INHALATION_SOLUTION | RESPIRATORY_TRACT | Status: DC | PRN

## 2014-05-29 MED ORDER — LOSARTAN POTASSIUM 50 MG PO TABS
50.0000 mg | ORAL_TABLET | Freq: Every day | ORAL | Status: DC
  Administered 2014-05-29 – 2014-05-31 (×3): 50 mg via ORAL
  Filled 2014-05-29 (×3): qty 1

## 2014-05-29 MED ORDER — INSULIN ASPART 100 UNIT/ML ~~LOC~~ SOLN
0.0000 [IU] | SUBCUTANEOUS | Status: DC
  Administered 2014-05-29: 1 [IU] via SUBCUTANEOUS
  Administered 2014-05-29: 2 [IU] via SUBCUTANEOUS
  Administered 2014-05-30: 1 [IU] via SUBCUTANEOUS

## 2014-05-29 MED ORDER — DEXTROSE 50 % IV SOLN: 25.0000 mL | Freq: Once | INTRAVENOUS | Status: AC | PRN

## 2014-05-29 MED ORDER — FAMOTIDINE 20 MG PO TABS
20.0000 mg | ORAL_TABLET | Freq: Every day | ORAL | Status: DC | PRN
  Filled 2014-05-29: qty 1

## 2014-05-29 MED ORDER — HYDROXYZINE HCL 25 MG PO TABS
25.0000 mg | ORAL_TABLET | Freq: Every day | ORAL | Status: DC
  Administered 2014-05-29 – 2014-05-31 (×3): 25 mg via ORAL
  Filled 2014-05-29 (×3): qty 1

## 2014-05-29 MED ORDER — SODIUM CHLORIDE 0.9 % IV SOLN
INTRAVENOUS | Status: AC
  Administered 2014-05-29: 02:00:00 via INTRAVENOUS

## 2014-05-29 MED ORDER — ONDANSETRON HCL 4 MG PO TABS: 4.0000 mg | ORAL_TABLET | Freq: Four times a day (QID) | ORAL | Status: DC | PRN

## 2014-05-29 MED ORDER — TEMAZEPAM 15 MG PO CAPS: 15.0000 mg | ORAL_CAPSULE | Freq: Every evening | ORAL | Status: DC | PRN

## 2014-05-29 MED ORDER — HYDRALAZINE HCL 20 MG/ML IJ SOLN
10.0000 mg | INTRAMUSCULAR | Status: DC | PRN
Start: 2014-05-29 — End: 2014-05-29

## 2014-05-29 MED ORDER — GLUCOSE-VITAMIN C 4-6 GM-MG PO CHEW: 4.0000 | CHEWABLE_TABLET | ORAL | Status: DC | PRN

## 2014-05-29 MED ORDER — BUPROPION HCL ER (XL) 300 MG PO TB24
450.0000 mg | ORAL_TABLET | Freq: Every day | ORAL | Status: DC
  Administered 2014-05-29 – 2014-05-31 (×3): 450 mg via ORAL
  Filled 2014-05-29 (×3): qty 1

## 2014-05-29 MED ORDER — MORPHINE SULFATE 2 MG/ML IJ SOLN
1.0000 mg | INTRAMUSCULAR | Status: DC | PRN
  Administered 2014-05-29 (×2): 2 mg via INTRAVENOUS
  Filled 2014-05-29: qty 1

## 2014-05-29 MED ORDER — HYDROCODONE-ACETAMINOPHEN 5-325 MG PO TABS
1.0000 | ORAL_TABLET | ORAL | Status: DC | PRN
  Administered 2014-05-29 (×2): 2 via ORAL
  Administered 2014-05-30 (×2): 1 via ORAL
  Filled 2014-05-29: qty 1
  Filled 2014-05-29 (×3): qty 2

## 2014-05-29 MED ORDER — DOCUSATE SODIUM 100 MG PO CAPS
100.0000 mg | ORAL_CAPSULE | Freq: Two times a day (BID) | ORAL | Status: DC
  Administered 2014-05-29 – 2014-05-31 (×4): 100 mg via ORAL
  Filled 2014-05-29 (×6): qty 1

## 2014-05-29 MED ORDER — ONDANSETRON HCL 4 MG/2ML IJ SOLN: 4.0000 mg | Freq: Four times a day (QID) | INTRAMUSCULAR | Status: DC | PRN

## 2014-05-29 NOTE — Progress Notes (Signed)
Chart reviewed. Admitted after midnight. Patient examined. Neuro exam nonfocal. Will start diet. Repeat CT brain in am. neurosurger consult pending.  Crista Curborinna Yadier Bramhall, MD [facility] [phone]

## 2014-05-29 NOTE — Progress Notes (Signed)
Utilization Review Completed.[name] [name] T7/19/2015  

## 2014-05-30 DIAGNOSIS — I2789 Other specified pulmonary heart diseases: Secondary | ICD-10-CM

## 2014-05-30 DIAGNOSIS — S06330A Contusion and laceration of cerebrum, unspecified, without loss of consciousness, initial encounter: Secondary | ICD-10-CM

## 2014-05-30 DIAGNOSIS — F3289 Other specified depressive episodes: Secondary | ICD-10-CM

## 2014-05-30 DIAGNOSIS — E1149 Type 2 diabetes mellitus with other diabetic neurological complication: Secondary | ICD-10-CM

## 2014-05-30 DIAGNOSIS — F329 Major depressive disorder, single episode, unspecified: Secondary | ICD-10-CM

## 2014-05-30 DIAGNOSIS — Z950 Presence of cardiac pacemaker: Secondary | ICD-10-CM

## 2014-05-30 DIAGNOSIS — E119 Type 2 diabetes mellitus without complications: Secondary | ICD-10-CM | POA: Diagnosis present

## 2014-05-30 DIAGNOSIS — IMO0001 Reserved for inherently not codable concepts without codable children: Secondary | ICD-10-CM

## 2014-05-30 DIAGNOSIS — S0990XA Unspecified injury of head, initial encounter: Secondary | ICD-10-CM

## 2014-05-30 DIAGNOSIS — W19XXXS Unspecified fall, sequela: Secondary | ICD-10-CM

## 2014-05-30 DIAGNOSIS — I272 Pulmonary hypertension, unspecified: Secondary | ICD-10-CM | POA: Diagnosis present

## 2014-05-30 DIAGNOSIS — R269 Unspecified abnormalities of gait and mobility: Secondary | ICD-10-CM

## 2014-05-30 DIAGNOSIS — T7589XS Other specified effects of external causes, sequela: Secondary | ICD-10-CM

## 2014-05-30 DIAGNOSIS — I10 Essential (primary) hypertension: Secondary | ICD-10-CM

## 2014-05-30 DIAGNOSIS — E1165 Type 2 diabetes mellitus with hyperglycemia: Secondary | ICD-10-CM

## 2014-05-30 LAB — GLUCOSE, CAPILLARY
GLUCOSE-CAPILLARY: 96 mg/dL (ref 70–99)
GLUCOSE-CAPILLARY: 120 mg/dL — AB (ref 70–99)
Glucose-Capillary: 108 mg/dL — ABNORMAL HIGH (ref 70–99)
Glucose-Capillary: 111 mg/dL — ABNORMAL HIGH (ref 70–99)
Glucose-Capillary: 121 mg/dL — ABNORMAL HIGH (ref 70–99)
Glucose-Capillary: 146 mg/dL — ABNORMAL HIGH (ref 70–99)

## 2014-05-30 MED ORDER — TAB-A-VITE/IRON PO TABS
1.0000 | ORAL_TABLET | Freq: Every day | ORAL | Status: DC
  Filled 2014-05-30: qty 1

## 2014-05-30 MED ORDER — CALCIUM CARBONATE ANTACID 500 MG PO CHEW
1.0000 | CHEWABLE_TABLET | Freq: Four times a day (QID) | ORAL | Status: DC | PRN
  Filled 2014-05-30: qty 1

## 2014-05-30 MED ORDER — INSULIN ASPART 100 UNIT/ML ~~LOC~~ SOLN
0.0000 [IU] | Freq: Three times a day (TID) | SUBCUTANEOUS | Status: DC
  Administered 2014-05-31: 1 [IU] via SUBCUTANEOUS
  Administered 2014-05-31: 2 [IU] via SUBCUTANEOUS

## 2014-05-30 MED ORDER — ADULT MULTIVITAMIN W/MINERALS CH
1.0000 | ORAL_TABLET | Freq: Every day | ORAL | Status: DC
  Administered 2014-05-30 – 2014-05-31 (×2): 1 via ORAL
  Filled 2014-05-30 (×3): qty 1

## 2014-05-30 MED ORDER — FERROUS SULFATE 325 (65 FE) MG PO TABS
325.0000 mg | ORAL_TABLET | Freq: Every day | ORAL | Status: DC
  Administered 2014-05-30 – 2014-05-31 (×2): 325 mg via ORAL
  Filled 2014-05-30 (×2): qty 1

## 2014-05-30 NOTE — Clinical Social Work Psychosocial (Signed)
Clinical Social Work Department BRIEF PSYCHOSOCIAL ASSESSMENT 05/30/2014  Patient:  [name] [name],[name] [name]     Account Number:  [id number]     Admit date:  05/28/2014  Clinical Social Worker:  Delmer IslamRAWFORD,Jd Mccaster, LCSW  Date/Time:  05/30/2014 02:32 AM  Referred by:  Physician  Date Referred:  05/29/2014 Referred for  SNF Placement   Other Referral:   Interview type:  Family Other interview type:    PSYCHOSOCIAL DATA Living Status:  FACILITY [facility] Level of care:  Assisted Living Primary support name:  [name] [name] Primary support relationship to patient:  SIBLING Degree of support available:   Patient's daughter [name] [name] ([phone]) is involved with patient's care. Patient also has another daughter [name] [name] and a sister Marketing executiveCricket [name].    CURRENT CONCERNS Current Concerns  Post-Acute Placement   Other Concerns:    SOCIAL WORK ASSESSMENT / PLAN CSW talked by phone with patient's daughter [name] [name] regarding discharge plans. [facility] and CSW also brought up the possible option for a skilled facility. [name] stated that she and her sister are not sure at this point what the most appropriate discharge plan will be. CSW informed that [facility] and while there feel frequently as she did at home.    CSW offered support and understanding and assured daughter that CSW will continue to follow patient's progress and recontact family later regarding discharge planning.   Assessment/plan status:  Psychosocial Support/Ongoing Assessment of Needs Other assessment/ plan:   Information/referral to community resources:   None needed or requested at this time.    PATIENT'S/FAMILY'S RESPONSE TO PLAN OF CARE: Daughter receptive to listening to CSW, however she is not ready to talk about specifically about the possible option, which are [facility]F [facility] SNF.

## 2014-05-30 NOTE — Progress Notes (Signed)
[facility] PROGRESS NOTE  [name] [name] UJW:[id number] DOB: [birth year] DOA: 05/28/2014 PCP: [name] Carney, MD  Assessment/Plan:  Principal Problem:  traumatic hemorrhagic cerebral contusion: reviewed images with Dr. Chestine [name], radiology. Blood noted on scans more consistent with hemorrhagic cerebral contusions, rather than SDH.  Slightly progressed in right temporal area.  1.6 cm and 0.6 cm. Left vertex contusion stable. Neurosurgery did not leave note in chart, but nonsurgical management recommended. Will discuss with NS. Pt does not want surgery, regardless of recommendations "Dr. Patty [name] said my heart is too weak even for a colonoscopy". Continue SDU and repeat scan in 24 - 48 hours depending on NS recs. PT/OT eval. Pain improved today. Active Problems:   DEPRESSIVE DISORDER NOT ELSEWHERE CLASSIFIED   CHRONIC OBSTRUCTIVE PULMONARY DISEASE, MODERATE   Atrial fibrillation, not on anticoagulation due to recurrent falls which has been worked up at [facility] per Dr. Yevonne [name] last note   Pacemaker   Fall   Type II or unspecified type diabetes mellitus without mention of complication,   Essential hypertension, benign   Unsteady gait   Chronic respiratory failure   Hypoglycemia   Hypokalemia   Closed head injury   Pulmonary hypertension, moderate to severe   DM (diabetes mellitus), type 2 with neurological complications  Code Status:  meds only code Family Communication:   Disposition Plan:  ?  HPI/Subjective: No headache currently. Has been up to commode.  Objective: Filed Vitals:   05/30/14 0804  BP: 156/76  Pulse: 88  Temp: 97.8 F (36.6 C)  Resp:     Intake/Output Summary (Last 24 hours) at 05/30/14 0816 Last data filed at 05/30/14 0700  Gross per 24 hour  Intake  962.5 ml  Output   1350 ml  Net -387.5 ml   Filed Weights   05/29/14 0130  Weight: 52.8 kg (116 lb 6.5 oz)    Exam:   General:  Asleep. Arousable. Comfortable. Appropriate, but does not remember  me  HEENT: PERRL. Left forehead with echymoses  Cardiovascular: RRR without MGR  Respiratory: CTA without WRR  Abdomen: s, NT, ND  Ext: no CCE  Neuro: CN and motor strength intact  Basic Metabolic Panel:  Recent Labs Lab 05/28/14 2034 05/28/14 2041 05/29/14 0218  NA 137 140 142  K 3.3* 3.1* 3.3*  CL 93* 97 98  CO2 24  --  27  GLUCOSE 217* 228* 118*  BUN 23 24* 21  CREATININE 0.84 0.90 0.83  CALCIUM 9.9  --  9.6  MG  --   --  1.7  PHOS  --   --  3.6   Liver Function Tests:  Recent Labs Lab 05/29/14 0218  AST 26  ALT 10  ALKPHOS 98  BILITOT 0.7  PROT 7.3  ALBUMIN 4.2   No results found for this basename: LIPASE, AMYLASE,  in the last 168 hours No results found for this basename: AMMONIA,  in the last 168 hours CBC:  Recent Labs Lab 05/28/14 2034 05/28/14 2041 05/29/14 0218  WBC 7.6  --  9.4  HGB 14.3 15.3* 13.5  HCT 42.9 45.0 40.8  MCV 96.6  --  96.0  PLT 234  --  248   Cardiac Enzymes:  Recent Labs Lab 05/29/14 0245 05/29/14 0738  TROPONINI <0.30 <0.30   BNP (last 3 results) No results found for this basename: PROBNP,  in the last 8760 hours CBG:  Recent Labs Lab 05/29/14 1626 05/29/14 1957 05/29/14 2156 05/30/14 0021 05/30/14 0416  GLUCAP 78 165* 120* 111*  146*    Recent Results (from the past 240 hour(s))  MRSA PCR SCREENING     Status: None   Collection Time    05/29/14 12:49 AM      Result Value Ref Range Status   MRSA by PCR NEGATIVE  NEGATIVE Final   Comment:            The GeneXpert MRSA Assay (FDA     approved for NASAL specimens     only), is one component of a     comprehensive MRSA colonization     surveillance program. It is not     intended to diagnose MRSA     infection nor to guide or     monitor treatment for     MRSA infections.     Studies: Ct Head Wo Contrast  05/29/2014   CLINICAL DATA:  Frequent falls.  Follow-up subdural hematoma.  EXAM: CT HEAD WITHOUT CONTRAST  TECHNIQUE: Contiguous axial  images were obtained from the base of the skull through the vertex without intravenous contrast.  COMPARISON:  CT of the head performed 05/28/2014  FINDINGS: The two foci of hemorrhage along the right tentorium cerebelli have increased in size, measuring 1.6 cm and 0.6 cm. Though these could reflect subdural hematoma as previously suggested, the well-circumscribed appearance suggests focal contusions along the inferior aspect of the right temporal lobe.  There also appears to be relatively stable 9 mm focal gyral contusion at the anterior aspect of the vertex, on the left side.  Scattered periventricular and subcortical white matter change likely reflects small vessel ischemic microangiopathy. Prominence of the ventricles and sulci suggests mild cortical volume loss.  The posterior fossa, including the cerebellum, brainstem and fourth ventricle, is within normal limits. The third and lateral ventricles, and basal ganglia are unremarkable in appearance. No midline shift is seen.  There is no evidence of fracture; visualized osseous structures are unremarkable in appearance. The orbits are within normal limits. The paranasal sinuses and mastoid air cells are well-aerated. A prominent scalp hematoma is noted overlying the left frontoparietal calvarium.  IMPRESSION: 1. Two foci of hemorrhage along the right tentorium cerebelli have increased in size, measuring 1.6 cm and 0.6 cm. Though these could reflect subdural hematoma as previously suggested, the well-circumscribed appearance suggests focal contusions along the inferior aspect of the right temporal lobe. 2. Relatively stable appearance to acute 9 mm focal gyral intraparenchymal contusion at the anterior aspect of the vertex on the left side. 3. Scattered small vessel ischemic microangiopathy and mild cortical volume loss. 4. Prominent scalp hematoma overlying the left frontoparietal calvarium.  These results were called by telephone at the time of interpretation on  05/29/2014 at 11:30 pm to Dr. Everett Graffhomas [name], who verbally acknowledged these results.   Electronically Signed   By: Roanna RaiderJeffery  Chang M.D.   On: 05/29/2014 23:32   Ct Head Wo Contrast  05/28/2014   CLINICAL DATA:  Status post fall, large frontal hematoma  EXAM: CT HEAD WITHOUT CONTRAST  TECHNIQUE: Contiguous axial images were obtained from the base of the skull through the vertex without intravenous contrast.  COMPARISON:  05/21/2014  FINDINGS: There is a small amount of blood along the right side of the tentorium concerning for a small amount of subdural blood.  There is no evidence of mass effect, midline shift, or extra-axial fluid collections. There is no evidence of a space-occupying lesion. There is no evidence of a cortical-based area of acute infarction. There is generalized cerebral atrophy. There  is periventricular white matter low attenuation likely secondary to microangiopathy.  The ventricles and sulci are appropriate for the patient's age. The basal cisterns are patent.  Visualized portions of the orbits are unremarkable. The visualized portions of the paranasal sinuses and mastoid air cells are unremarkable.  The osseous structures are unremarkable. There is a large left frontal scalp hematoma.  IMPRESSION: 1. Small amount of subdural hemorrhage along the right side of the tentorium. These results were called by telephone at the time of interpretation on 05/28/2014 at 9:20 pm to Dr. Wayland Salinas , who verbally acknowledged these results. 2. Large left frontal scalp hematoma.   Electronically Signed   By: Elige Ko   On: 05/28/2014 21:20    Scheduled Meds: . budesonide-formoterol  2 puff Inhalation BID  . buPROPion  450 mg Oral Daily  . diltiazem  240 mg Oral Daily  . docusate sodium  100 mg Oral BID  . hydrOXYzine  25 mg Oral Daily  . insulin aspart  0-9 Units Subcutaneous 6 times per day  . losartan  50 mg Oral Daily  . sertraline  200 mg Oral Daily  . sodium chloride  3 mL Intravenous  Q12H   Continuous Infusions:   Time spent: 35 minutes  Pearse Shiffler L  [facility] Pager [phone]. If 7PM-7AM, please contact night-coverage at [email or web address], password [facility] 05/30/2014, 8:16 AM  LOS: 2 days

## 2014-05-30 NOTE — Consult Note (Signed)
Reason for Consult: Closed head injury Referring Physician: Dr. Storm Frisk  [name] [name] is an 78 y.o. female.  HPI: Patient is a 78 year old lady who has a history of frequent falls. She has atrial fibrillation but has not been on anticoagulation secondary to the frequency of falls. She sustained a fall hitting her left frontal region sustaining a large ecchymotic bruise there. She was brought to the emergency department CT scan to demonstrates the presence of a right tentorial subdural hematoma that is small in followup scan was performed on the 19th and this reveals that the blood collection has increased slightly in size there is however a substantial amount of atrophy in the region and no mass effect is noted. Clinically the patient has remained stable.  Past Medical History  Diagnosis Date  . Hypertrophic cardiomyopathy     a. 11/2013 Echo: EF 55-60%, basal inf HK, sev dil LA, no evidence of HCM.  PASP 13mHg.  .Marland KitchenHypertension   . Situational mixed anxiety and depressive disorder   . Hypercholesterolemia   . Falls frequently     coumadin stopped  . Pulmonary HTN     a. has had prior right heart cath in 2010; was felt that most likely due to elevated left sided pressures and would not benefit from vasodilator therapy;  b. 11/2013 Echo: PASP 621mg.  . Marland Kitchenxygen dependent     "3L 24/7" (08/05/2013)  . COPD (chronic obstructive pulmonary disease)   . TIA (transient ischemic attack)   . Atrial fibrillation     a. Chronic; No longer on coumadin 2/2 frequent falls.  . CHF (congestive heart failure)   . Pacemaker     a. 04/2012 MDT [id number] DamePM, ser #: [id number].  . Varicose veins   . History of pneumonia     "couple times; long time ago" (08/05/2013)  . Type II diabetes mellitus   . Anemia     "as a child" (08/05/2013)  . GERD (gastroesophageal reflux disease)   . H/O hiatal hernia   . Osteoarthritis   . Arthritis     "all over me; in all my joints" (08/05/2013)  . Coccyx pain    . Gout     "right foot" (08/05/2013)  . Anxiety   . Depression   . Chest pain     a. 08/2011 Myoview: sm, fixed anteroseptal defect (attenuation), no ischemia, EF 62%.    Past Surgical History  Procedure Laterality Date  . Total knee arthroplasty Right 2011  . Tonsillectomy    . Appendectomy    . Abdominal hysterectomy      "partial" (08/05/2013)  . Dilation and curettage of uterus      "several" (08/05/2013)  . Foot neuroma surgery Right   . Insert / replace / remove pacemaker  1998; 2000's; 2014  . Cardiac catheterization      "more than once" (08/05/2013)  . Back surgery      "bone spur removed; mid back" (08/05/2013)    Family History  Problem Relation Age of Onset  . Other Father     died in plane crash  . Cancer Mother     lymphoma-NHL  . Coronary artery disease Grandchild   . Heart disease Maternal Grandmother     Social History:  reports that she quit smoking about 8 years ago. Her smoking use included Cigarettes. She has a 40 pack-year smoking history. She has never used smokeless tobacco. She reports that she does not drink alcohol or use  illicit drugs.  Allergies:  Allergies  Allergen Reactions  . Aspirin Nausea And Vomiting  . Calan [Verapamil Hcl] Other (See Comments)    Patient states it makes her "out of her mind"; bp bottoms out (takes diltiazem at home)  . Crestor [Rosuvastatin Calcium] Other (See Comments)    Muscle pain  . Escitalopram Oxalate Other (See Comments)    Reaction Unknown   . Lipitor [Atorvastatin Calcium] Other (See Comments)    myalgia  . Lopressor [Metoprolol Tartrate] Other (See Comments)    Blood pressure bottoms out   . Nitroglycerin Other (See Comments)    Reaction unknown  . Norvasc [Amlodipine Besylate] Other (See Comments)    fatigue  . Nsaids Nausea Only  . Oxycodone Other (See Comments)    Reaction unknown  . Prednisone Swelling  . Sulfa Antibiotics Nausea Only and Other (See Comments)    Makes her stomach hurt  .  Vimovo [Naproxen-Esomeprazole] Nausea Only  . Penicillins Hives and Rash    Medications: I have reviewed the patient's current medications.  Results for orders placed during the hospital encounter of 05/28/14 (from the past 48 hour(s))  CBG MONITORING, ED     Status: Abnormal   Collection Time    05/28/14  8:33 PM      Result Value Ref Range   Glucose-Capillary 214 (*) 70 - 99 mg/dL  CBC     Status: None   Collection Time    05/28/14  8:34 PM      Result Value Ref Range   WBC 7.6  4.0 - 10.5 K/uL   RBC 4.44  3.87 - 5.11 MIL/uL   Hemoglobin 14.3  12.0 - 15.0 g/dL   HCT 42.9  36.0 - 46.0 %   MCV 96.6  78.0 - 100.0 fL   MCH 32.2  26.0 - 34.0 pg   MCHC 33.3  30.0 - 36.0 g/dL   RDW 13.7  11.5 - 15.5 %   Platelets 234  150 - 400 K/uL  BASIC METABOLIC PANEL     Status: Abnormal   Collection Time    05/28/14  8:34 PM      Result Value Ref Range   Sodium 137  137 - 147 mEq/L   Potassium 3.3 (*) 3.7 - 5.3 mEq/L   Chloride 93 (*) 96 - 112 mEq/L   CO2 24  19 - 32 mEq/L   Glucose, Bld 217 (*) 70 - 99 mg/dL   BUN 23  6 - 23 mg/dL   Creatinine, Ser 0.84  0.50 - 1.10 mg/dL   Calcium 9.9  8.4 - 10.5 mg/dL   GFR calc non Af Amer 66 (*) >90 mL/min   GFR calc Af Amer 77 (*) >90 mL/min   Comment: (NOTE)     The eGFR has been calculated using the CKD EPI equation.     This calculation has not been validated in all clinical situations.     eGFR's persistently <90 mL/min signify possible Chronic Kidney     Disease.   Anion gap 20 (*) 5 - 15  I-STAT CHEM 8, ED     Status: Abnormal   Collection Time    05/28/14  8:41 PM      Result Value Ref Range   Sodium 140  137 - 147 mEq/L   Potassium 3.1 (*) 3.7 - 5.3 mEq/L   Chloride 97  96 - 112 mEq/L   BUN 24 (*) 6 - 23 mg/dL   Creatinine, Ser 0.90  0.50 - 1.10 mg/dL   Glucose, Bld 228 (*) 70 - 99 mg/dL   Calcium, Ion 1.21  1.13 - 1.30 mmol/L   TCO2 26  0 - 100 mmol/L   Hemoglobin 15.3 (*) 12.0 - 15.0 g/dL   HCT 45.0  36.0 - 46.0 %  CBG  MONITORING, ED     Status: Abnormal   Collection Time    05/28/14  8:52 PM      Result Value Ref Range   Glucose-Capillary 187 (*) 70 - 99 mg/dL  MRSA PCR SCREENING     Status: None   Collection Time    05/29/14 12:49 AM      Result Value Ref Range   MRSA by PCR NEGATIVE  NEGATIVE   Comment:            The GeneXpert MRSA Assay (FDA     approved for NASAL specimens     only), is one component of a     comprehensive MRSA colonization     surveillance program. It is not     intended to diagnose MRSA     infection nor to guide or     monitor treatment for     MRSA infections.  TSH     Status: None   Collection Time    05/29/14  2:15 AM      Result Value Ref Range   TSH 3.510  0.350 - 4.500 uIU/mL  MAGNESIUM     Status: None   Collection Time    05/29/14  2:18 AM      Result Value Ref Range   Magnesium 1.7  1.5 - 2.5 mg/dL  PHOSPHORUS     Status: None   Collection Time    05/29/14  2:18 AM      Result Value Ref Range   Phosphorus 3.6  2.3 - 4.6 mg/dL  COMPREHENSIVE METABOLIC PANEL     Status: Abnormal   Collection Time    05/29/14  2:18 AM      Result Value Ref Range   Sodium 142  137 - 147 mEq/L   Potassium 3.3 (*) 3.7 - 5.3 mEq/L   Chloride 98  96 - 112 mEq/L   CO2 27  19 - 32 mEq/L   Glucose, Bld 118 (*) 70 - 99 mg/dL   BUN 21  6 - 23 mg/dL   Creatinine, Ser 0.83  0.50 - 1.10 mg/dL   Calcium 9.6  8.4 - 10.5 mg/dL   Total Protein 7.3  6.0 - 8.3 g/dL   Albumin 4.2  3.5 - 5.2 g/dL   AST 26  0 - 37 U/L   ALT 10  0 - 35 U/L   Alkaline Phosphatase 98  39 - 117 U/L   Total Bilirubin 0.7  0.3 - 1.2 mg/dL   GFR calc non Af Amer 67 (*) >90 mL/min   GFR calc Af Amer 78 (*) >90 mL/min   Comment: (NOTE)     The eGFR has been calculated using the CKD EPI equation.     This calculation has not been validated in all clinical situations.     eGFR's persistently <90 mL/min signify possible Chronic Kidney     Disease.   Anion gap 17 (*) 5 - 15  CBC     Status: None    Collection Time    05/29/14  2:18 AM      Result Value Ref Range   WBC 9.4  4.0 - 10.5 K/uL  RBC 4.25  3.87 - 5.11 MIL/uL   Hemoglobin 13.5  12.0 - 15.0 g/dL   HCT 40.8  36.0 - 46.0 %   MCV 96.0  78.0 - 100.0 fL   MCH 31.8  26.0 - 34.0 pg   MCHC 33.1  30.0 - 36.0 g/dL   RDW 13.9  11.5 - 15.5 %   Platelets 248  150 - 400 K/uL  HEMOGLOBIN A1C     Status: None   Collection Time    05/29/14  2:18 AM      Result Value Ref Range   Hemoglobin A1C 5.6  <5.7 %   Comment: (NOTE)                                                                               According to the ADA Clinical Practice Recommendations for 2011, when     HbA1c is used as a screening test:      >=6.5%   Diagnostic of Diabetes Mellitus               (if abnormal result is confirmed)     5.7-6.4%   Increased risk of developing Diabetes Mellitus     References:Diagnosis and Classification of Diabetes Mellitus,Diabetes     IZTI,4580,99(IPJAS 1):S62-S69 and Standards of Medical Care in             Diabetes - 2011,Diabetes Care,2011,34 (Suppl 1):S11-S61.   Mean Plasma Glucose 114  <117 mg/dL   Comment: Performed at [facility]  TROPONIN I     Status: None   Collection Time    05/29/14  2:45 AM      Result Value Ref Range   Troponin I <0.30  <0.30 ng/mL   Comment:            Due to the release kinetics of cTnI,     a negative result within the first hours     of the onset of symptoms does not rule out     myocardial infarction with certainty.     If myocardial infarction is still suspected,     repeat the test at appropriate intervals.  GLUCOSE, CAPILLARY     Status: Abnormal   Collection Time    05/29/14  2:51 AM      Result Value Ref Range   Glucose-Capillary 111 (*) 70 - 99 mg/dL  GLUCOSE, CAPILLARY     Status: Abnormal   Collection Time    05/29/14  4:35 AM      Result Value Ref Range   Glucose-Capillary 139 (*) 70 - 99 mg/dL  URINALYSIS, ROUTINE W REFLEX MICROSCOPIC     Status: Abnormal    Collection Time    05/29/14  6:41 AM      Result Value Ref Range   Color, Urine YELLOW  YELLOW   APPearance CLOUDY (*) CLEAR   Specific Gravity, Urine 1.023  1.005 - 1.030   pH 6.0  5.0 - 8.0   Glucose, UA NEGATIVE  NEGATIVE mg/dL   Hgb urine dipstick NEGATIVE  NEGATIVE   Bilirubin Urine NEGATIVE  NEGATIVE   Ketones, ur NEGATIVE  NEGATIVE mg/dL   Protein, ur NEGATIVE  NEGATIVE mg/dL  Urobilinogen, UA 0.2  0.0 - 1.0 mg/dL   Nitrite NEGATIVE  NEGATIVE   Leukocytes, UA NEGATIVE  NEGATIVE   Comment: MICROSCOPIC NOT DONE ON URINES WITH NEGATIVE PROTEIN, BLOOD, LEUKOCYTES, NITRITE, OR GLUCOSE <1000 mg/dL.  TROPONIN I     Status: None   Collection Time    05/29/14  7:38 AM      Result Value Ref Range   Troponin I <0.30  <0.30 ng/mL   Comment:            Due to the release kinetics of cTnI,     a negative result within the first hours     of the onset of symptoms does not rule out     myocardial infarction with certainty.     If myocardial infarction is still suspected,     repeat the test at appropriate intervals.  GLUCOSE, CAPILLARY     Status: Abnormal   Collection Time    05/29/14  7:40 AM      Result Value Ref Range   Glucose-Capillary 118 (*) 70 - 99 mg/dL  GLUCOSE, CAPILLARY     Status: Abnormal   Collection Time    05/29/14 11:17 AM      Result Value Ref Range   Glucose-Capillary 164 (*) 70 - 99 mg/dL  GLUCOSE, CAPILLARY     Status: None   Collection Time    05/29/14  4:26 PM      Result Value Ref Range   Glucose-Capillary 78  70 - 99 mg/dL  GLUCOSE, CAPILLARY     Status: Abnormal   Collection Time    05/29/14  7:57 PM      Result Value Ref Range   Glucose-Capillary 165 (*) 70 - 99 mg/dL  GLUCOSE, CAPILLARY     Status: Abnormal   Collection Time    05/29/14  9:56 PM      Result Value Ref Range   Glucose-Capillary 120 (*) 70 - 99 mg/dL  GLUCOSE, CAPILLARY     Status: Abnormal   Collection Time    05/30/14 12:21 AM      Result Value Ref Range    Glucose-Capillary 111 (*) 70 - 99 mg/dL  GLUCOSE, CAPILLARY     Status: Abnormal   Collection Time    05/30/14  4:16 AM      Result Value Ref Range   Glucose-Capillary 146 (*) 70 - 99 mg/dL  GLUCOSE, CAPILLARY     Status: None   Collection Time    05/30/14  8:06 AM      Result Value Ref Range   Glucose-Capillary 96  70 - 99 mg/dL  GLUCOSE, CAPILLARY     Status: Abnormal   Collection Time    05/30/14 12:10 PM      Result Value Ref Range   Glucose-Capillary 108 (*) 70 - 99 mg/dL  GLUCOSE, CAPILLARY     Status: Abnormal   Collection Time    05/30/14  4:04 PM      Result Value Ref Range   Glucose-Capillary 120 (*) 70 - 99 mg/dL    Ct Head Wo Contrast  05/29/2014   CLINICAL DATA:  Frequent falls.  Follow-up subdural hematoma.  EXAM: CT HEAD WITHOUT CONTRAST  TECHNIQUE: Contiguous axial images were obtained from the base of the skull through the vertex without intravenous contrast.  COMPARISON:  CT of the head performed 05/28/2014  FINDINGS: The two foci of hemorrhage along the right tentorium cerebelli have increased in size, measuring 1.6 cm  and 0.6 cm. Though these could reflect subdural hematoma as previously suggested, the well-circumscribed appearance suggests focal contusions along the inferior aspect of the right temporal lobe.  There also appears to be relatively stable 9 mm focal gyral contusion at the anterior aspect of the vertex, on the left side.  Scattered periventricular and subcortical white matter change likely reflects small vessel ischemic microangiopathy. Prominence of the ventricles and sulci suggests mild cortical volume loss.  The posterior fossa, including the cerebellum, brainstem and fourth ventricle, is within normal limits. The third and lateral ventricles, and basal ganglia are unremarkable in appearance. No midline shift is seen.  There is no evidence of fracture; visualized osseous structures are unremarkable in appearance. The orbits are within normal limits. The  paranasal sinuses and mastoid air cells are well-aerated. A prominent scalp hematoma is noted overlying the left frontoparietal calvarium.  IMPRESSION: 1. Two foci of hemorrhage along the right tentorium cerebelli have increased in size, measuring 1.6 cm and 0.6 cm. Though these could reflect subdural hematoma as previously suggested, the well-circumscribed appearance suggests focal contusions along the inferior aspect of the right temporal lobe. 2. Relatively stable appearance to acute 9 mm focal gyral intraparenchymal contusion at the anterior aspect of the vertex on the left side. 3. Scattered small vessel ischemic microangiopathy and mild cortical volume loss. 4. Prominent scalp hematoma overlying the left frontoparietal calvarium.  These results were called by telephone at the time of interpretation on 05/29/2014 at 11:30 pm to Dr. Harless Litten, who verbally acknowledged these results.   Electronically Signed   By: Garald Balding M.D.   On: 05/29/2014 23:32   Ct Head Wo Contrast  05/28/2014   CLINICAL DATA:  Status post fall, large frontal hematoma  EXAM: CT HEAD WITHOUT CONTRAST  TECHNIQUE: Contiguous axial images were obtained from the base of the skull through the vertex without intravenous contrast.  COMPARISON:  05/21/2014  FINDINGS: There is a small amount of blood along the right side of the tentorium concerning for a small amount of subdural blood.  There is no evidence of mass effect, midline shift, or extra-axial fluid collections. There is no evidence of a space-occupying lesion. There is no evidence of a cortical-based area of acute infarction. There is generalized cerebral atrophy. There is periventricular white matter low attenuation likely secondary to microangiopathy.  The ventricles and sulci are appropriate for the patient's age. The basal cisterns are patent.  Visualized portions of the orbits are unremarkable. The visualized portions of the paranasal sinuses and mastoid air cells are  unremarkable.  The osseous structures are unremarkable. There is a large left frontal scalp hematoma.  IMPRESSION: 1. Small amount of subdural hemorrhage along the right side of the tentorium. These results were called by telephone at the time of interpretation on 05/28/2014 at 9:20 pm to Dr. Riki Altes , who verbally acknowledged these results. 2. Large left frontal scalp hematoma.   Electronically Signed   By: Kathreen Devoid   On: 05/28/2014 21:20    Review of Systems  Musculoskeletal: Positive for falls.  Neurological: Positive for focal weakness and weakness.   Blood pressure 156/90, pulse 88, temperature 97.9 F (36.6 C), temperature source Oral, resp. rate 16, height _0  (1.626 m), weight 52.8 kg (116 lb 6.5 oz), SpO2 100.00%. Physical Exam  Constitutional: She is oriented to person, place, and time. She appears well-developed and well-nourished.  HENT:  Left frontal abrasion and subgaleal ecchymosis measuring 8 cm in diameter  Neurological: She  is alert and oriented to person, place, and time. She has normal reflexes.  No evidence of a drift. Cranial nerves reveal the pupils are 3 mm briskly reactive extraocular movements are full face is symmetric to grimace tongue and uvula in the midline sclera and conjunctiva are clear  Skin: Skin is warm and dry.    Assessment/Plan: Stable small tentorial subdural hematoma This subdural should resolve itself with simple observation. I do not believe further scanning is required unless the patient changes clinically. She may be mobilized and discharged as per medicine.  Lauriel Helin J 05/30/2014, 7:42 PM

## 2014-05-30 NOTE — Evaluation (Signed)
Physical Therapy Evaluation Patient Details Name: [name] [name] MRN: [id number] DOB: [birth year] Today's Date: 05/30/2014   History of Present Illness  Pt is a 78 y/o female admitted s/p fall. Patient woke up this AM and went to use the bathroom but hit her forehead on the door. She bounced off and hit the floor. Patient currently resides at the assisted living [facility]. Patient was having severe headache and nausea. EMS was called and she was brought to ER. In route it was noted that patient blood glucose was down to 45 she was given D50. In ER CT scan done showed small subdural hematoma. Patient has hx of frequent falls. She has A.fib but not on anticoagulation due to falls.  Clinical Impression  Pt admitted with the above. Pt currently with functional limitations due to the deficits listed below (see PT Problem List). At the time of PT eval pt with unsteadiness and difficulty managing transfers OOB due to balance and safety deficits. Pt will benefit from skilled PT to increase their independence and safety with mobility to allow discharge to the venue listed below.       Follow Up Recommendations Home health PT;Supervision/Assistance - 24 hour    Equipment Recommendations  None recommended by PT    Recommendations for Other Services       Precautions / Restrictions Precautions Precautions: Fall Restrictions Weight Bearing Restrictions: No      Mobility  Bed Mobility Overal bed mobility: Needs Assistance Bed Mobility: Supine to Sit     Supine to sit: Min guard     General bed mobility comments: Increased time to complete transition to EOB. Pt was able to initiate transfer but required min guard assist during trunk elevation to sit all the way on EOB.   Transfers Overall transfer level: Needs assistance Equipment used: Rolling walker (2 wheeled) Transfers: Sit to/from [employer] Sit to Stand: Mod assist Stand pivot transfers: Mod assist        General transfer comment: Poor safety awareness overall. Frequent cueing required for hand placement on RW, as well as for sequencing to complete SPT to [facility] and then to recliner. Pt states her "legs gave out" when she got to the chair, however it appeared more like a purposeful fall backwards into the chair. More discussion regarding safety and controlled descent to chair after transfer to recliner.   Ambulation/Gait             General Gait Details: Deferred  Stairs            Wheelchair Mobility    Modified Rankin (Stroke Patients Only)       Balance Overall balance assessment: History of Falls;Needs assistance Sitting-balance support: Feet supported;No upper extremity supported Sitting balance-Leahy Scale: Fair     Standing balance support: Bilateral upper extremity supported;During functional activity Standing balance-Leahy Scale: Poor                               Pertinent Vitals/Pain Vitals stable throughout session.     Home Living Family/patient expects to be discharged to:: Assisted living               Home Equipment: Walker - 2 wheels      Prior Function Level of Independence: Needs assistance   Gait / Transfers Assistance Needed: RW all the time  ADL's / Homemaking Assistance Needed: Occasionally receives assist with bathing/dressing.  Hand Dominance   Dominant Hand: Right    Extremity/Trunk Assessment   Upper Extremity Assessment: Defer to OT evaluation           Lower Extremity Assessment: Generalized weakness      Cervical / Trunk Assessment: Kyphotic  Communication   Communication: No difficulties  Cognition Arousal/Alertness: Awake/alert Behavior During Therapy: WFL for tasks assessed/performed Overall Cognitive Status: No family/caregiver present to determine baseline cognitive functioning       Memory: Decreased short-term memory              General Comments      Exercises         Assessment/Plan    PT Assessment Patient needs continued PT services  PT Diagnosis Difficulty walking;Generalized weakness   PT Problem List Decreased strength;Decreased range of motion;Decreased activity tolerance;Decreased balance;Decreased mobility;Decreased knowledge of use of DME;Decreased safety awareness;Decreased knowledge of precautions  PT Treatment Interventions DME instruction;Gait training;Stair training;Functional mobility training;Therapeutic activities;Therapeutic exercise;Neuromuscular re-education;Patient/family education   PT Goals (Current goals can be found in the Care Plan section) Acute Rehab PT Goals Patient Stated Goal: Return to ALF PT Goal Formulation: With patient Time For Goal Achievement: 06/13/14 Potential to Achieve Goals: Good    Frequency Min 3X/week   Barriers to discharge        Co-evaluation               End of Session Equipment Utilized During Treatment: Gait belt;Oxygen Activity Tolerance: Patient limited by fatigue Patient left: in chair;with chair alarm set;with call bell/phone within reach Nurse Communication: Mobility status         Time: [id number] PT Time Calculation (min): 24 min   Charges:   PT Evaluation $Initial PT Evaluation Tier I: 1 Procedure PT Treatments $Therapeutic Activity: 8-22 mins   PT G Codes:          [name], [name] 05/30/2014, 5:12 PM  Ruthann CancerLaura Hamilton, PT, DPT Acute Rehabilitation Services Pager: [phone]

## 2014-05-31 LAB — BASIC METABOLIC PANEL
ANION GAP: 18 — AB (ref 5–15)
BUN: 15 mg/dL (ref 6–23)
CALCIUM: 9.7 mg/dL (ref 8.4–10.5)
CO2: 27 mEq/L (ref 19–32)
Chloride: 96 mEq/L (ref 96–112)
Creatinine, Ser: 0.75 mg/dL (ref 0.50–1.10)
GFR, EST NON AFRICAN AMERICAN: 81 mL/min — AB (ref >90)
Glucose, Bld: 138 mg/dL — ABNORMAL HIGH (ref 70–99)
POTASSIUM: 3.8 meq/L (ref 3.7–5.3)
SODIUM: 141 meq/L (ref 137–147)

## 2014-05-31 LAB — GLUCOSE, CAPILLARY
GLUCOSE-CAPILLARY: 152 mg/dL — AB (ref 70–99)
Glucose-Capillary: 130 mg/dL — ABNORMAL HIGH (ref 70–99)

## 2014-05-31 LAB — MAGNESIUM: MAGNESIUM: 1.7 mg/dL (ref 1.5–2.5)

## 2014-05-31 NOTE — Progress Notes (Signed)
OT Cancellation Note  Patient Details Name: [name] [name] MRN: [id number] DOB: 05/17/1936   Cancelled Treatment:    Reason Eval/Treat Not Completed: OT screened, no needs identified, will sign off - per chart, pt to transfer back to [facility].   Recommend HHOT at ALF to ensure safe transition and maximize safety and independence with BADLs.   [name], Trinton Prewitt M 05/31/2014, 11:52 AM

## 2014-05-31 NOTE — Discharge Summary (Signed)
Physician Discharge Summary  [name] [name] ZOX:[id number] DOB: 24-Apr-1936 DOA: 05/28/2014  PCP: Lupe Carney, MD  Admit date: 05/28/2014 Discharge date: 05/31/2014  Time spent: greater than 30 minutes  Recommendations for Outpatient Follow-up:  1. Back to [facility]  2. Physical therapy at ALF  Discharge Diagnoses:  Principal Problem:   hemorrhagic cerebral contusion Active Problems:   DEPRESSIVE DISORDER NOT ELSEWHERE CLASSIFIED   CHRONIC OBSTRUCTIVE PULMONARY DISEASE, MODERATE   Atrial fibrillation   Pacemaker   Fall   Type II or unspecified type diabetes mellitus without mention of complication, uncontrolled   Essential hypertension, benign   Unsteady gait   Chronic respiratory failure   Hypoglycemia   Hypokalemia   Closed head injury   Pulmonary hypertension, moderate to severe   DM (diabetes mellitus), type 2 with neurological complications   Discharge Condition: stable  Code status:  No CPR or mechanical ventillation. Meds and defibrillation only  Filed Weights   05/29/14 0130  Weight: 52.8 kg (116 lb 6.5 oz)    History of present illness:  78 y.o. female  has a past medical history of Hypertrophic cardiomyopathy; Hypertension; Situational mixed anxiety and depressive disorder; Hypercholesterolemia; Falls frequently; Pulmonary HTN; Oxygen dependent; COPD (chronic obstructive pulmonary disease); TIA (transient ischemic attack); Atrial fibrillation; CHF (congestive heart failure); Pacemaker; Varicose veins; History of pneumonia; Type II diabetes mellitus; Anemia; GERD (gastroesophageal reflux disease); H/O hiatal hernia; Osteoarthritis; Arthritis; Coccyx pain; Gout; Anxiety; Depression; and Chest pain.  Presented with  Patient woke up this AM and went to use the bathroom but hit her forehead on the door. She bounced off and hit the floor. Patient currently resides at the assisted living [facility] at [facility]. Patient was having severe headache and nausea. EMS was  called and she was brought to ER. In route it was noted that patient blood glucose was down to 45 she was given D50. In ER CT scan done showed small subdural hematoma. NS have been consulted and recommended admission and repeat CT in AM. Patient has hx of frequent falls. She has A.fib but not on anticoagulation due to falls. She denies any chest pain no syncope. Although in the past she have had episodes of amnesia. In ER HR noted to be 110-120 range. patient states she took her medications today.  Hospitalist was called for admission for subdural hematoma   Hospital Course:  Admitted to step down unit. Neurosurgery consulted. Patient participated with PT. Headache has improved. Repeat CT shows slight increase in hemorrhagic cerebral contusion v. SDH, but Dr. [name] Dess recommends no futher imaging or testing and has cleared patient for discharge. May continue PT/OT at ALF. Patient is not on aspirin or anticoagulation due to recurrent falls.  Procedures:  none  Consultations:  neurosurery  Discharge Exam: Filed Vitals:   05/31/14 0810  BP: 152/79  Pulse: 92  Temp: 98.2 F (36.8 C)  Resp: 16    General: alert, appropriate HEENT: perrl. Left forehead with hematoma Cardiovascular: RRR Respiratory: CTA Neuro: nonfocal  Discharge Instructions   Diet - low sodium heart healthy    Complete by:  As directed      Walk with assistance    Complete by:  As directed             Medication List         acetaminophen 500 MG tablet  Commonly known as:  TYLENOL  Take 500 mg by mouth every 6 (six) hours as needed for moderate pain.     albuterol  108 (90 BASE) MCG/ACT inhaler  Commonly known as:  PROVENTIL HFA;VENTOLIN HFA  Inhale 2 puffs into the lungs every 4 (four) hours as needed (cough).     budesonide-formoterol 160-4.5 MCG/ACT inhaler  Commonly known as:  SYMBICORT  Inhale 2 puffs into the lungs 2 (two) times daily.     buPROPion 150 MG 24 hr tablet  Commonly known as:   WELLBUTRIN XL  Take 450 mg by mouth daily.     calcium carbonate 500 MG chewable tablet  Commonly known as:  TUMS - dosed in mg elemental calcium  Chew 1 tablet by mouth 4 (four) times daily as needed for indigestion. Only as needed     diltiazem 240 MG 24 hr capsule  Commonly known as:  CARDIZEM CD  Take 1 capsule (240 mg total) by mouth daily.     famotidine 20 MG tablet  Commonly known as:  PEPCID  Take 20 mg by mouth daily as needed for indigestion.     fluticasone 50 MCG/ACT nasal spray  Commonly known as:  FLONASE  Place 1 spray into both nostrils daily as needed for allergies.     furosemide 20 MG tablet  Commonly known as:  LASIX  Take 20 mg by mouth daily as needed (swelling).     HYDROcodone-acetaminophen 5-325 MG per tablet  Commonly known as:  NORCO/VICODIN  Take 1 tablet by mouth every 4 (four) hours as needed for moderate pain.     hydrOXYzine 25 MG capsule  Commonly known as:  VISTARIL  Take 25 mg by mouth daily. At noon. And every 8 hours as needed for anxiety     loperamide 2 MG capsule  Commonly known as:  IMODIUM  Take 2 mg by mouth as needed for diarrhea or loose stools.     losartan 50 MG tablet  Commonly known as:  COZAAR  Take 50 mg by mouth daily.     metFORMIN 500 MG tablet  Commonly known as:  GLUCOPHAGE  Take 500 mg by mouth 2 (two) times daily with a meal.     multivitamins with iron Tabs tablet  Take 1 tablet by mouth daily.     potassium chloride 20 MEQ packet  Commonly known as:  KLOR-CON  Take 20 mEq by mouth daily as needed (take with lasix).     sertraline 100 MG tablet  Commonly known as:  ZOLOFT  Take 200 mg by mouth daily.     temazepam 15 MG capsule  Commonly known as:  RESTORIL  Take 15 mg by mouth at bedtime as needed for sleep.       Allergies  Allergen Reactions  . Aspirin Nausea And Vomiting  . Calan [Verapamil Hcl] Other (See Comments)    Patient states it makes her "out of her mind"; bp bottoms out (takes  diltiazem at home)  . Crestor [Rosuvastatin Calcium] Other (See Comments)    Muscle pain  . Escitalopram Oxalate Other (See Comments)    Reaction Unknown   . Lipitor [Atorvastatin Calcium] Other (See Comments)    myalgia  . Lopressor [Metoprolol Tartrate] Other (See Comments)    Blood pressure bottoms out   . Nitroglycerin Other (See Comments)    Reaction unknown  . Norvasc [Amlodipine Besylate] Other (See Comments)    fatigue  . Nsaids Nausea Only  . Oxycodone Other (See Comments)    Reaction unknown  . Prednisone Swelling  . Sulfa Antibiotics Nausea Only and Other (See Comments)    Makes  her stomach hurt  . Vimovo [Naproxen-Esomeprazole] Nausea Only  . Penicillins Hives and Rash       Follow-up Information   Follow up with [facility], MD In 3 weeks.   Specialty:  Family Medicine   Contact information:   [address] [place] [address] [phone]        The results of significant diagnostics from this hospitalization (including imaging, microbiology, ancillary and laboratory) are listed below for reference.    Significant Diagnostic Studies: Dg Chest 2 View  05/21/2014   CLINICAL DATA:  CHF.  EXAM: CHEST  2 VIEW  COMPARISON:  None.  FINDINGS: Mediastinum and hilar structures normal. Cardiomegaly with normal pulmonary vascularity. Cardiac pacer with lead tips in the right and the right ventricle. No pleural effusion or pneumothorax. Interstitial changes noted consistent with chronic interstitial lung disease. Degenerative changes lumbar spine.  IMPRESSION: 1. Cardiomegaly. No evidence of overt congestive heart failure. Cardiac pacer with lead tips in the right atrium and right ventricle. 2. Mild chronic interstitial lung disease.   Electronically Signed   By: Maisie Fus  Register   On: 05/21/2014 18:57   Dg Lumbar Spine Complete  05/05/2014   CLINICAL DATA:  Recent traumatic injury with back pain  EXAM: LUMBAR SPINE - COMPLETE 4+ VIEW  COMPARISON:   11/18/2013  FINDINGS: Five lumbar type vertebral bodies are well visualized. No pars defects are seen. Compression deformity of L4 is again seen and stable from the prior exam. No new compression deformity is seen. No other fractures are noted. Diffuse aortic calcifications are noted.  IMPRESSION: Stable L4 compression deformity.  No acute abnormality noted.   Electronically Signed   By: Alcide Clever M.D.   On: 05/05/2014 10:07   Dg Sacrum/coccyx  05/05/2014   CLINICAL DATA:  Back pain after fall.  EXAM: SACRUM AND COCCYX - 2+ VIEW  COMPARISON:  None.  FINDINGS: No fracture or other abnormality is seen involving the sacrum or coccyx. Sacroiliac joints appear normal.  IMPRESSION: Normal sacrum and coccyx.   Electronically Signed   By: Roque Lias M.D.   On: 05/05/2014 10:08   Dg Elbow Complete Left  05/21/2014   CLINICAL DATA:  Fall and abrasion.  EXAM: LEFT ELBOW - COMPLETE 3+ VIEW  COMPARISON:  None.  FINDINGS: There is no evidence of fracture, dislocation, or joint effusion. Mild asymmetric subcutaneous stranding is seen along the mid medial aspect of the distal humerus. No soft tissue gas or radiopaque foreign body is identified. Soft tissues are unremarkable.  IMPRESSION: No acute bony abnormality.   Electronically Signed   By: Britta Mccreedy M.D.   On: 05/21/2014 18:56   Dg Forearm Left  05/21/2014   CLINICAL DATA:  Injury, left forearm pain.  EXAM: LEFT FOREARM - 2 VIEW  COMPARISON:  None.  FINDINGS: No acute bony or joint abnormality is identified. First CMC osteoarthritis is noted. Soft tissue structures are unremarkable.  IMPRESSION: No acute finding.   Electronically Signed   By: Drusilla Kanner M.D.   On: 05/21/2014 18:55   Ct Head Wo Contrast  05/29/2014   CLINICAL DATA:  Frequent falls.  Follow-up subdural hematoma.  EXAM: CT HEAD WITHOUT CONTRAST  TECHNIQUE: Contiguous axial images were obtained from the base of the skull through the vertex without intravenous contrast.  COMPARISON:  CT of  the head performed 05/28/2014  FINDINGS: The two foci of hemorrhage along the right tentorium cerebelli have increased in size, measuring 1.6 cm and 0.6 cm.  Though these could reflect subdural hematoma as previously suggested, the well-circumscribed appearance suggests focal contusions along the inferior aspect of the right temporal lobe.  There also appears to be relatively stable 9 mm focal gyral contusion at the anterior aspect of the vertex, on the left side.  Scattered periventricular and subcortical white matter change likely reflects small vessel ischemic microangiopathy. Prominence of the ventricles and sulci suggests mild cortical volume loss.  The posterior fossa, including the cerebellum, brainstem and fourth ventricle, is within normal limits. The third and lateral ventricles, and basal ganglia are unremarkable in appearance. No midline shift is seen.  There is no evidence of fracture; visualized osseous structures are unremarkable in appearance. The orbits are within normal limits. The paranasal sinuses and mastoid air cells are well-aerated. A prominent scalp hematoma is noted overlying the left frontoparietal calvarium.  IMPRESSION: 1. Two foci of hemorrhage along the right tentorium cerebelli have increased in size, measuring 1.6 cm and 0.6 cm. Though these could reflect subdural hematoma as previously suggested, the well-circumscribed appearance suggests focal contusions along the inferior aspect of the right temporal lobe. 2. Relatively stable appearance to acute 9 mm focal gyral intraparenchymal contusion at the anterior aspect of the vertex on the left side. 3. Scattered small vessel ischemic microangiopathy and mild cortical volume loss. 4. Prominent scalp hematoma overlying the left frontoparietal calvarium.  These results were called by telephone at the time of interpretation on 05/29/2014 at 11:30 pm to Dr. Everett Graffhomas Callahan, who verbally acknowledged these results.   Electronically Signed   By:  Roanna RaiderJeffery  Chang M.D.   On: 05/29/2014 23:32   Ct Head Wo Contrast  05/28/2014   CLINICAL DATA:  Status post fall, large frontal hematoma  EXAM: CT HEAD WITHOUT CONTRAST  TECHNIQUE: Contiguous axial images were obtained from the base of the skull through the vertex without intravenous contrast.  COMPARISON:  05/21/2014  FINDINGS: There is a small amount of blood along the right side of the tentorium concerning for a small amount of subdural blood.  There is no evidence of mass effect, midline shift, or extra-axial fluid collections. There is no evidence of a space-occupying lesion. There is no evidence of a cortical-based area of acute infarction. There is generalized cerebral atrophy. There is periventricular white matter low attenuation likely secondary to microangiopathy.  The ventricles and sulci are appropriate for the patient's age. The basal cisterns are patent.  Visualized portions of the orbits are unremarkable. The visualized portions of the paranasal sinuses and mastoid air cells are unremarkable.  The osseous structures are unremarkable. There is a large left frontal scalp hematoma.  IMPRESSION: 1. Small amount of subdural hemorrhage along the right side of the tentorium. These results were called by telephone at the time of interpretation on 05/28/2014 at 9:20 pm to Dr. Wayland SalinasJOHN BEDNAR , who verbally acknowledged these results. 2. Large left frontal scalp hematoma.   Electronically Signed   By: Elige KoHetal  Patel   On: 05/28/2014 21:20   Ct Head (brain) Wo Contrast  05/21/2014   CLINICAL DATA:  Status post fall.  Dizziness.  EXAM: CT HEAD WITHOUT CONTRAST  CT CERVICAL SPINE WITHOUT CONTRAST  TECHNIQUE: Multidetector CT imaging of the head and cervical spine was performed following the standard protocol without intravenous contrast. Multiplanar CT image reconstructions of the cervical spine were also generated.  COMPARISON:  Head and cervical spine CT scan 05/05/2014.  FINDINGS: CT HEAD FINDINGS  Again seen are  atrophy and chronic microvascular ischemic change. No  evidence of acute intracranial abnormality including infarct, hemorrhage, mass lesion, mass effect, midline shift or abnormal extra-axial fluid collection is identified. There is no hydrocephalus or pneumocephalus. The calvarium is intact.  CT CERVICAL SPINE FINDINGS  There is no fracture or malalignment of the cervical spine. Multilevel facet arthropathy and degenerative disc disease are again seen. Schmorl's node in the superior endplate of T1 is noted. Imaged paraspinous structures are unremarkable.  IMPRESSION: No acute finding head or cervical spine. Stable compared to prior exam.   Electronically Signed   By: Drusilla Kanner M.D.   On: 05/21/2014 18:15   Ct Head Wo Contrast  05/05/2014   CLINICAL DATA:  Recent traumatic injury with pain  EXAM: CT HEAD WITHOUT CONTRAST  CT CERVICAL SPINE WITHOUT CONTRAST  TECHNIQUE: Multidetector CT imaging of the head and cervical spine was performed following the standard protocol without intravenous contrast. Multiplanar CT image reconstructions of the cervical spine were also generated.  COMPARISON:  04/16/2014  FINDINGS: CT HEAD FINDINGS  The bony calvarium is intact. No focal soft tissue abnormality is noted. Diffuse atrophic and chronic white matter ischemic change is seen. Basal ganglia calcifications are noted bilaterally. No findings to suggest acute hemorrhage, acute infarction or space-occupying mass lesion are noted.  CT CERVICAL SPINE FINDINGS  Seven cervical segments are well visualized. Vertebral body height is well maintained. Multilevel facet hypertrophic changes are noted slightly worse on the left than the right. Osteophytic changes are noted at C5-6 and C6-7. No acute fracture or acute facet abnormality is noted.  IMPRESSION: CT of the head: Chronic changes without acute abnormality.  CT of the cervical spine: Chronic changes without acute abnormality.   Electronically Signed   By: Alcide Clever M.D.    On: 05/05/2014 10:27   Ct Cervical Spine Wo Contrast  05/21/2014   CLINICAL DATA:  Status post fall.  Dizziness.  EXAM: CT HEAD WITHOUT CONTRAST  CT CERVICAL SPINE WITHOUT CONTRAST  TECHNIQUE: Multidetector CT imaging of the head and cervical spine was performed following the standard protocol without intravenous contrast. Multiplanar CT image reconstructions of the cervical spine were also generated.  COMPARISON:  Head and cervical spine CT scan 05/05/2014.  FINDINGS: CT HEAD FINDINGS  Again seen are atrophy and chronic microvascular ischemic change. No evidence of acute intracranial abnormality including infarct, hemorrhage, mass lesion, mass effect, midline shift or abnormal extra-axial fluid collection is identified. There is no hydrocephalus or pneumocephalus. The calvarium is intact.  CT CERVICAL SPINE FINDINGS  There is no fracture or malalignment of the cervical spine. Multilevel facet arthropathy and degenerative disc disease are again seen. Schmorl's node in the superior endplate of T1 is noted. Imaged paraspinous structures are unremarkable.  IMPRESSION: No acute finding head or cervical spine. Stable compared to prior exam.   Electronically Signed   By: Drusilla Kanner M.D.   On: 05/21/2014 18:15   Ct Cervical Spine Wo Contrast  05/05/2014   CLINICAL DATA:  Recent traumatic injury with pain  EXAM: CT HEAD WITHOUT CONTRAST  CT CERVICAL SPINE WITHOUT CONTRAST  TECHNIQUE: Multidetector CT imaging of the head and cervical spine was performed following the standard protocol without intravenous contrast. Multiplanar CT image reconstructions of the cervical spine were also generated.  COMPARISON:  04/16/2014  FINDINGS: CT HEAD FINDINGS  The bony calvarium is intact. No focal soft tissue abnormality is noted. Diffuse atrophic and chronic white matter ischemic change is seen. Basal ganglia calcifications are noted bilaterally. No findings to suggest acute hemorrhage, acute  infarction or space-occupying  mass lesion are noted.  CT CERVICAL SPINE FINDINGS  Seven cervical segments are well visualized. Vertebral body height is well maintained. Multilevel facet hypertrophic changes are noted slightly worse on the left than the right. Osteophytic changes are noted at C5-6 and C6-7. No acute fracture or acute facet abnormality is noted.  IMPRESSION: CT of the head: Chronic changes without acute abnormality.  CT of the cervical spine: Chronic changes without acute abnormality.   Electronically Signed   By: Alcide Clever M.D.   On: 05/05/2014 10:27   Dg Humerus Left  05/21/2014   CLINICAL DATA:  Injury, left humerus pain.  EXAM: LEFT HUMERUS - 2+ VIEW  COMPARISON:  None.  FINDINGS: There is no acute bony or joint abnormality. Acromioclavicular degenerative change is noted. Imaged left lung and ribs are unremarkable.  IMPRESSION: No acute finding.   Electronically Signed   By: Drusilla Kanner M.D.   On: 05/21/2014 18:56    Microbiology: Recent Results (from the past 240 hour(s))  MRSA PCR SCREENING     Status: None   Collection Time    05/29/14 12:49 AM      Result Value Ref Range Status   MRSA by PCR NEGATIVE  NEGATIVE Final   Comment:            The GeneXpert MRSA Assay (FDA     approved for NASAL specimens     only), is one component of a     comprehensive MRSA colonization     surveillance program. It is not     intended to diagnose MRSA     infection nor to guide or     monitor treatment for     MRSA infections.     Labs: Basic Metabolic Panel:  Recent Labs Lab 05/28/14 2034 05/28/14 2041 05/29/14 0218 05/31/14 0245  NA 137 140 142 141  K 3.3* 3.1* 3.3* 3.8  CL 93* 97 98 96  CO2 24  --  27 27  GLUCOSE 217* 228* 118* 138*  BUN 23 24* 21 15  CREATININE 0.84 0.90 0.83 0.75  CALCIUM 9.9  --  9.6 9.7  MG  --   --  1.7 1.7  PHOS  --   --  3.6  --    Liver Function Tests:  Recent Labs Lab 05/29/14 0218  AST 26  ALT 10  ALKPHOS 98  BILITOT 0.7  PROT 7.3  ALBUMIN 4.2   No  results found for this basename: LIPASE, AMYLASE,  in the last 168 hours No results found for this basename: AMMONIA,  in the last 168 hours CBC:  Recent Labs Lab 05/28/14 2034 05/28/14 2041 05/29/14 0218  WBC 7.6  --  9.4  HGB 14.3 15.3* 13.5  HCT 42.9 45.0 40.8  MCV 96.6  --  96.0  PLT 234  --  248   Cardiac Enzymes:  Recent Labs Lab 05/29/14 0245 05/29/14 0738  TROPONINI <0.30 <0.30   BNP: BNP (last 3 results) No results found for this basename: PROBNP,  in the last 8760 hours CBG:  Recent Labs Lab 05/30/14 0806 05/30/14 1210 05/30/14 1604 05/30/14 2115 05/31/14 0811  GLUCAP 96 108* 120* 121* 130*       Signed:  Brinlyn Cena L  [facility] 05/31/2014, 11:08 AM

## 2014-05-31 NOTE — Care Management Note (Addendum)
    Page 1 of 2   05/31/2014     2:36:13 PM CARE MANAGEMENT NOTE 05/31/2014  Patient:  [name] [name],[name] [name]   Account Number:  [id number]  Date Initiated:  05/31/2014  Documentation initiated by:  [name] [name],[name] [name]  Subjective/Objective Assessment:   adm w hemorrhagic cerebral contusion     Action/Plan:   from [facility]   Anticipated DC Date:  05/31/2014   Anticipated DC Plan:  HOME W HOME HEALTH SERVICES  In-house referral  Clinical Social Worker      DC Planning Services  CM consult      [facility]  Resumption Of Svcs/PTA Provider   Choice offered to / List presented to:     DME arranged  [facility] - MANUAL      DME agency  [facility]     HH arranged  HH-2 PT  HH[facility]3 OT      [facility]   Status of service:   Medicare Important Message given?  YES (If response is "NO", the following Medicare IM given date fields will be blank) Date Medicare IM given:  05/31/2014 Medicare IM given by:  [name] [name],[name] [name] Date Additional Medicare IM given:   Additional Medicare IM given by:    Discharge Disposition:  HOME W HOME HEALTH SERVICES  Per UR Regulation:  Reviewed for med. necessity/level of care/duration of stay  If discussed at Long Length of Stay Meetings, dates discussed:    Comments:  7/21 1428 [name] [name] [name] Braun rn,bsn pt dc back to alf. sw has made arrangements. pt was active w [facility] prior to adm and i have alerted mary w [facility] of dc. alf wanted hosp bed and wheel[facility]. md felt she could not justify hosp bed w medicare but did order wheel[facility]. alerted sw so she could let [facility] know md did not order hosp bed. [facility] wanted to use [facility] for eq and [facility] had wanted [facility] to rec dme provider.

## 2014-06-01 ENCOUNTER — Encounter: Payer: Self-pay | Admitting: *Deleted

## 2014-06-21 ENCOUNTER — Emergency Department (HOSPITAL_COMMUNITY)
Admission: EM | Admit: 2014-06-21 | Discharge: 2014-06-21 | Disposition: A | Discharge status: Home / Self Care | Payer: Medicare Other | Attending: Emergency Medicine | Admitting: Emergency Medicine

## 2014-06-21 ENCOUNTER — Emergency Department (HOSPITAL_COMMUNITY): Payer: Medicare Other

## 2014-06-21 ENCOUNTER — Encounter (HOSPITAL_COMMUNITY): Payer: Self-pay | Admitting: Emergency Medicine

## 2014-06-21 DIAGNOSIS — F411 Generalized anxiety disorder: Secondary | ICD-10-CM | POA: Diagnosis not present

## 2014-06-21 DIAGNOSIS — S51009A Unspecified open wound of unspecified elbow, initial encounter: Secondary | ICD-10-CM | POA: Diagnosis not present

## 2014-06-21 DIAGNOSIS — S0003XA Contusion of scalp, initial encounter: Secondary | ICD-10-CM | POA: Diagnosis not present

## 2014-06-21 DIAGNOSIS — Z880 Allergy status to penicillin: Secondary | ICD-10-CM | POA: Insufficient documentation

## 2014-06-21 DIAGNOSIS — S1093XA Contusion of unspecified part of neck, initial encounter: Secondary | ICD-10-CM | POA: Diagnosis not present

## 2014-06-21 DIAGNOSIS — S51809A Unspecified open wound of unspecified forearm, initial encounter: Secondary | ICD-10-CM | POA: Diagnosis not present

## 2014-06-21 DIAGNOSIS — Z79899 Other long term (current) drug therapy: Secondary | ICD-10-CM | POA: Diagnosis not present

## 2014-06-21 DIAGNOSIS — R519 Headache, unspecified: Secondary | ICD-10-CM

## 2014-06-21 DIAGNOSIS — I10 Essential (primary) hypertension: Secondary | ICD-10-CM | POA: Insufficient documentation

## 2014-06-21 DIAGNOSIS — Z862 Personal history of diseases of the blood and blood-forming organs and certain disorders involving the immune mechanism: Secondary | ICD-10-CM | POA: Diagnosis not present

## 2014-06-21 DIAGNOSIS — Z87891 Personal history of nicotine dependence: Secondary | ICD-10-CM | POA: Insufficient documentation

## 2014-06-21 DIAGNOSIS — IMO0002 Reserved for concepts with insufficient information to code with codable children: Secondary | ICD-10-CM | POA: Insufficient documentation

## 2014-06-21 DIAGNOSIS — Z9981 Dependence on supplemental oxygen: Secondary | ICD-10-CM | POA: Insufficient documentation

## 2014-06-21 DIAGNOSIS — K219 Gastro-esophageal reflux disease without esophagitis: Secondary | ICD-10-CM | POA: Diagnosis not present

## 2014-06-21 DIAGNOSIS — S0083XA Contusion of other part of head, initial encounter: Principal | ICD-10-CM | POA: Insufficient documentation

## 2014-06-21 DIAGNOSIS — I4891 Unspecified atrial fibrillation: Secondary | ICD-10-CM | POA: Diagnosis not present

## 2014-06-21 DIAGNOSIS — R296 Repeated falls: Secondary | ICD-10-CM | POA: Diagnosis not present

## 2014-06-21 DIAGNOSIS — R51 Headache: Secondary | ICD-10-CM

## 2014-06-21 DIAGNOSIS — Y9210 Unspecified residential institution as the place of occurrence of the external cause: Secondary | ICD-10-CM | POA: Insufficient documentation

## 2014-06-21 DIAGNOSIS — Z9889 Other specified postprocedural states: Secondary | ICD-10-CM | POA: Insufficient documentation

## 2014-06-21 DIAGNOSIS — Z8701 Personal history of pneumonia (recurrent): Secondary | ICD-10-CM | POA: Diagnosis not present

## 2014-06-21 DIAGNOSIS — Y9389 Activity, other specified: Secondary | ICD-10-CM | POA: Insufficient documentation

## 2014-06-21 DIAGNOSIS — E119 Type 2 diabetes mellitus without complications: Secondary | ICD-10-CM | POA: Insufficient documentation

## 2014-06-21 DIAGNOSIS — S3992XA Unspecified injury of lower back, initial encounter: Secondary | ICD-10-CM

## 2014-06-21 DIAGNOSIS — F329 Major depressive disorder, single episode, unspecified: Secondary | ICD-10-CM | POA: Diagnosis not present

## 2014-06-21 DIAGNOSIS — Z8673 Personal history of transient ischemic attack (TIA), and cerebral infarction without residual deficits: Secondary | ICD-10-CM | POA: Insufficient documentation

## 2014-06-21 DIAGNOSIS — W19XXXA Unspecified fall, initial encounter: Secondary | ICD-10-CM

## 2014-06-21 DIAGNOSIS — Z950 Presence of cardiac pacemaker: Secondary | ICD-10-CM | POA: Diagnosis not present

## 2014-06-21 DIAGNOSIS — I509 Heart failure, unspecified: Secondary | ICD-10-CM | POA: Insufficient documentation

## 2014-06-21 DIAGNOSIS — S0990XA Unspecified injury of head, initial encounter: Secondary | ICD-10-CM | POA: Insufficient documentation

## 2014-06-21 DIAGNOSIS — Z8739 Personal history of other diseases of the musculoskeletal system and connective tissue: Secondary | ICD-10-CM | POA: Diagnosis not present

## 2014-06-21 DIAGNOSIS — J449 Chronic obstructive pulmonary disease, unspecified: Secondary | ICD-10-CM | POA: Diagnosis not present

## 2014-06-21 DIAGNOSIS — J4489 Other specified chronic obstructive pulmonary disease: Secondary | ICD-10-CM | POA: Insufficient documentation

## 2014-06-21 DIAGNOSIS — F3289 Other specified depressive episodes: Secondary | ICD-10-CM | POA: Diagnosis not present

## 2014-06-21 NOTE — ED Provider Notes (Signed)
CSN: [id number]     Arrival date & time 06/21/14  1533 History   First MD Initiated Contact with Patient 06/21/14 1539     Chief Complaint  Patient presents with  . Fall     (Consider location/radiation/quality/duration/timing/severity/associated sxs/prior Treatment) The history is provided by the patient and medical records.   This is a 78 y.o. F with hx of HTN, HLP, DM2, COPD, AFIB, frequent falls, presenting to the ED from [facility] for fall.  Patient states she is usually confined to her wheelchair and only ambulates when assisted by staff members but today she decided to get get up and get herself something to drink on her own.  States she lost her footing and fell, landing on her buttocks and rolled to her right, hitting the back of her head on a heater.  Denies LOC.  Patient complains of pain to her posterior head and tailbone.  Denies numbness or paresthesias of extremities.  Denies dizziness, lightheadedness, or feelings of syncope prior to fall.  No changes in speech, visual disturbance, tinnitus, or confusion.  No chest pain, SOB, palpitations, or abdominal pain.  Past Medical History  Diagnosis Date  . Hypertrophic cardiomyopathy     a. 11/2013 Echo: EF 55-60%, basal inf HK, sev dil LA, no evidence of HCM.  PASP 60mmHg.  [name]. Hypertension   . Situational mixed anxiety and depressive disorder   . Hypercholesterolemia   . Falls frequently     coumadin stopped  . Pulmonary HTN     a. has had prior right heart cath in 2010; was felt that most likely due to elevated left sided pressures and would not benefit from vasodilator therapy;  b. 11/2013 Echo: PASP 60mmHg.  [name]. Oxygen dependent     "3L 24/7" (08/05/2013)  . COPD (chronic obstructive pulmonary disease)   . TIA (transient ischemic attack)   . Atrial fibrillation     a. Chronic; No longer on coumadin 2/2 frequent falls.  . CHF (congestive heart failure)   . Pacemaker     a. 04/2012 MDT [id number] [facility] PPM, ser  #: [id number] H.  . Varicose veins   . History of pneumonia     "couple times; long time ago" (08/05/2013)  . Type II diabetes mellitus   . Anemia     "as a child" (08/05/2013)  . GERD (gastroesophageal reflux disease)   . H/O hiatal hernia   . Osteoarthritis   . Arthritis     "all over me; in all my joints" (08/05/2013)  . Coccyx pain   . Gout     "right foot" (08/05/2013)  . Anxiety   . Depression   . Chest pain     a. 08/2011 Myoview: sm, fixed anteroseptal defect (attenuation), no ischemia, EF 62%.   Past Surgical History  Procedure Laterality Date  . Total knee arthroplasty Right 2011  . Tonsillectomy    . Appendectomy    . Abdominal hysterectomy      "partial" (08/05/2013)  . Dilation and curettage of uterus      "several" (08/05/2013)  . Foot neuroma surgery Right   . Insert / replace / remove pacemaker  1998; 2000's; 2014  . Cardiac catheterization      "more than once" (08/05/2013)  . Back surgery      "bone spur removed; mid back" (08/05/2013)   Family History  Problem Relation Age of Onset  . Other Father     died in plane crash  .  Cancer Mother     lymphoma-NHL  . Coronary artery disease Grandchild   . Heart disease Maternal Grandmother    History  Substance Use Topics  . Smoking status: Former Smoker -- 1.00 packs/day for 40 years    Types: Cigarettes    Quit date: 11/15/2005  . Smokeless tobacco: Never Used  . Alcohol Use: No   OB History   Grav Para Term Preterm Abortions TAB SAB Ect Mult Living                 Review of Systems  Musculoskeletal: Positive for arthralgias.  Neurological: Positive for headaches.  All other systems reviewed and are negative.     Allergies  Aspirin; Calan; Crestor; Escitalopram oxalate; Lipitor; Lopressor; Nitroglycerin; Norvasc; Nsaids; Oxycodone; Prednisone; Sulfa antibiotics; Vimovo; and Penicillins  Home Medications   Prior to Admission medications   Medication Sig Start Date End Date Taking? Authorizing  Provider  acetaminophen (TYLENOL) 500 MG tablet Take 500 mg by mouth every 6 (six) hours as needed for moderate pain.    Historical Provider, MD  albuterol (PROVENTIL HFA;VENTOLIN HFA) 108 (90 BASE) MCG/ACT inhaler Inhale 2 puffs into the lungs every 4 (four) hours as needed (cough).    Historical Provider, MD  budesonide-formoterol (SYMBICORT) 160-4.5 MCG/ACT inhaler Inhale 2 puffs into the lungs 2 (two) times daily.    Historical Provider, MD  buPROPion (WELLBUTRIN XL) 150 MG 24 hr tablet Take 450 mg by mouth daily.     Historical Provider, MD  calcium carbonate (TUMS - DOSED IN MG ELEMENTAL CALCIUM) 500 MG chewable tablet Chew 1 tablet by mouth 4 (four) times daily as needed for indigestion. Only as needed    Historical Provider, MD  diltiazem (CARDIZEM CD) 240 MG 24 hr capsule Take 1 capsule (240 mg total) by mouth daily. 01/07/14   Cassell Clement, MD  famotidine (PEPCID) 20 MG tablet Take 20 mg by mouth daily as needed for indigestion.    Historical Provider, MD  fluticasone (FLONASE) 50 MCG/ACT nasal spray Place 1 spray into both nostrils daily as needed for allergies.     Historical Provider, MD  furosemide (LASIX) 20 MG tablet Take 20 mg by mouth daily as needed (swelling).    Historical Provider, MD  HYDROcodone-acetaminophen (NORCO/VICODIN) 5-325 MG per tablet Take 1 tablet by mouth every 4 (four) hours as needed for moderate pain.    Historical Provider, MD  hydrOXYzine (VISTARIL) 25 MG capsule Take 25 mg by mouth daily. At noon. And every 8 hours as needed for anxiety    Historical Provider, MD  loperamide (IMODIUM) 2 MG capsule Take 2 mg by mouth as needed for diarrhea or loose stools.    Historical Provider, MD  losartan (COZAAR) 50 MG tablet Take 50 mg by mouth daily.    Historical Provider, MD  metFORMIN (GLUCOPHAGE) 500 MG tablet Take 500 mg by mouth 2 (two) times daily with a meal.    Historical Provider, MD  Multiple Vitamins-Iron (MULTIVITAMINS WITH IRON) TABS tablet Take 1 tablet  by mouth daily.    Historical Provider, MD  potassium chloride (KLOR-CON) 20 MEQ packet Take 20 mEq by mouth daily as needed (take with lasix).     Historical Provider, MD  sertraline (ZOLOFT) 100 MG tablet Take 200 mg by mouth daily.     Historical Provider, MD  temazepam (RESTORIL) 15 MG capsule Take 15 mg by mouth at bedtime as needed for sleep.    Historical Provider, MD   BP 150/88  Pulse 95  Temp(Src) 98.3 F (36.8 C) (Oral)  Resp 16  SpO2 100%  Physical Exam  Nursing note and vitals reviewed. Constitutional: She is oriented to person, place, and time. She appears well-developed and well-nourished.  HENT:  Head: Normocephalic. Head is with contusion.  Nose: Nose normal.  Mouth/Throat: Uvula is midline, oropharynx is clear and moist and mucous membranes are normal.  Posterior scalp with small hematoma noted; left frontal hematoma noted from prior fall; no laceration or breaks in skin; no bleeding noted; mid-face stable  Eyes: Conjunctivae and EOM are normal. Pupils are equal, round, and reactive to light.  Neck: Normal range of motion. Neck supple.  Cardiovascular: Normal rate, regular rhythm and normal heart sounds.   Pulmonary/Chest: Effort normal and breath sounds normal.  Abdominal: Soft. Bowel sounds are normal. There is no tenderness. There is no guarding.  Musculoskeletal: Normal range of motion.       Cervical back: Normal.       Lumbar back: She exhibits tenderness, bony tenderness and pain.  Small skin tear to right elbow and right proximal forearm; without active bleeding; no bony deformities noted, full ROM of elbow without pain; arm NVI Focal tenderness of lumbar spine and sacrum without noted deformities; BLE are neurovascularly intact with normal strength and sensation throughout  Neurological: She is alert and oriented to person, place, and time.  AAOx3, answering questions appropriately; equal strength UE and LE bilaterally; CN grossly intact; moves all  extremities appropriately without ataxia; no focal neuro deficits or facial asymmetry appreciated  Skin: Skin is warm and dry.  Psychiatric: She has a normal mood and affect.    ED Course  Procedures (including critical care time) Labs Review Labs Reviewed - No data to display  Imaging Review Dg Lumbar Spine Complete  06/21/2014   CLINICAL DATA:  Fall, low back pain  EXAM: LUMBAR SPINE - COMPLETE 4+ VIEW  COMPARISON:  05/05/2014  FINDINGS: Bones are osteopenic. Diffuse degenerative changes of the lumbar spine as before. Chronic L4 compression fracture with vertebra plana deformity. No definite new compression fracture, wedge-shaped deformity or focal kyphosis. Aortoiliac atherosclerosis noted. Overall stable exam.  IMPRESSION: Chronic L4 compression fracture deformity. Stable exam. No definite acute osseous finding.   Electronically Signed   By: Ruel Favors M.D.   On: 06/21/2014 17:17   Dg Sacrum/coccyx  06/21/2014   CLINICAL DATA:  Fall with sacral pain.  EXAM: SACRUM AND COCCYX - 2+ VIEW  COMPARISON:  None.  FINDINGS: No acute sacral or coccygeal fracture is visualized. No evidence of coccygeal dislocation. Moderate spondylosis present at L5-S1. There does appear to be a compression fracture at L4 which was seen previously.  IMPRESSION: No acute sacral or coccygeal fracture identified.   Electronically Signed   By: Irish Lack M.D.   On: 06/21/2014 17:11   Ct Head Wo Contrast  06/21/2014   CLINICAL DATA:  Fall  EXAM: CT HEAD WITHOUT CONTRAST  CT CERVICAL SPINE WITHOUT CONTRAST  TECHNIQUE: Multidetector CT imaging of the head and cervical spine was performed following the standard protocol without intravenous contrast. Multiplanar CT image reconstructions of the cervical spine were also generated.  COMPARISON:  Prior CT from 05/29/2014  FINDINGS: CT HEAD FINDINGS  Left frontal scalp contusion is present. Second small contusion seen at the posterior scalp near the vertex. No underlying  calvarial fracture.  Previously seen acute tentorial hemorrhages have resolved. No acute intracranial hemorrhage or large vessel territory infarct. Atrophy with chronic microvascular ischemic changes  again seen. No mass lesion, midline shift, or hydrocephalus. No extra-axial fluid collection.  No acute abnormality seen about either orbit.  Paranasal sinuses and mastoid air cells are clear.  CT CERVICAL SPINE FINDINGS  There is straightening of the normal cervical lordosis, which may be related to patient positioning. Vertebral body heights are preserved. Normal C1-2 articulations are intact. No prevertebral soft tissue swelling. No acute fracture or listhesis.  Moderate multilevel degenerative disc disease as evidenced by intervertebral disc space narrowing and endplate osteophytosis seen at C5-6 and C6-7. Multilevel facet arthropathy present within the cervical spine, most severe at C3-4, C4-5 and C5-6 on the left.  Visualized soft tissues of the neck are within normal limits. Visualized lung apices are clear without evidence of apical pneumothorax.  IMPRESSION: CT BRAIN:  1. No acute intracranial process. 2. Left frontal and posterior scalp contusions. 3. Interval resolution of recently seen subdural hemorrhage. 4. Atrophy with chronic small vessel ischemic disease.  CT CERVICAL SPINE:  No acute traumatic injury within the cervical spine.   Electronically Signed   By: Rise Mu M.D.   On: 06/21/2014 16:57   Ct Cervical Spine Wo Contrast  06/21/2014   CLINICAL DATA:  Fall  EXAM: CT HEAD WITHOUT CONTRAST  CT CERVICAL SPINE WITHOUT CONTRAST  TECHNIQUE: Multidetector CT imaging of the head and cervical spine was performed following the standard protocol without intravenous contrast. Multiplanar CT image reconstructions of the cervical spine were also generated.  COMPARISON:  Prior CT from 05/29/2014  FINDINGS: CT HEAD FINDINGS  Left frontal scalp contusion is present. Second small contusion seen at the  posterior scalp near the vertex. No underlying calvarial fracture.  Previously seen acute tentorial hemorrhages have resolved. No acute intracranial hemorrhage or large vessel territory infarct. Atrophy with chronic microvascular ischemic changes again seen. No mass lesion, midline shift, or hydrocephalus. No extra-axial fluid collection.  No acute abnormality seen about either orbit.  Paranasal sinuses and mastoid air cells are clear.  CT CERVICAL SPINE FINDINGS  There is straightening of the normal cervical lordosis, which may be related to patient positioning. Vertebral body heights are preserved. Normal C1-2 articulations are intact. No prevertebral soft tissue swelling. No acute fracture or listhesis.  Moderate multilevel degenerative disc disease as evidenced by intervertebral disc space narrowing and endplate osteophytosis seen at C5-6 and C6-7. Multilevel facet arthropathy present within the cervical spine, most severe at C3-4, C4-5 and C5-6 on the left.  Visualized soft tissues of the neck are within normal limits. Visualized lung apices are clear without evidence of apical pneumothorax.  IMPRESSION: CT BRAIN:  1. No acute intracranial process. 2. Left frontal and posterior scalp contusions. 3. Interval resolution of recently seen subdural hemorrhage. 4. Atrophy with chronic small vessel ischemic disease.  CT CERVICAL SPINE:  No acute traumatic injury within the cervical spine.   Electronically Signed   By: Rise Mu M.D.   On: 06/21/2014 16:57     EKG Interpretation None      MDM   Final diagnoses:  Fall, initial encounter  Headache, unspecified headache type  Tailbone injury, initial encounter   78 year old female with recurrent falls, presenting to ED for fall. She did have head injury without loss of consciousness. On exam she is baseline oriented without focal neurologic deficits. She complains of a headache as well as tailbone pain. She does have 2 small skin tears her right  elbow and right forearm but no bony deformities and has full range of motion of these  areas without pain. On review of medical records, tetanus was given in this facility in 2014.  Given history of subdural hematoma last month, we'll obtain CT head and cervical spine as well as plain films of lumbar spine and sacrum.  Imaging negative for acute findings. CT head with interval improvement of prior subdural hematoma. Patient has gotten out of bed and ambulated to restroom assisted with staff members which is her baseline.  She remains afebrile and non-toxic appearing, alert and baseline oriented.  Patient will be discharged back to facility via [facility].  Patient hypertensive at time of discharge-- tells nursing staff she has not taken her HTN meds today.  Will take them upon return.    Case discussed with attending physician, Dr. Littie Deeds, who personally evaluated patient and agrees with plan of care.  Garlon Hatchet, PA-C 06/21/14 2248

## 2014-06-21 NOTE — ED Notes (Signed)
Pt [facility] EMS [facility]. EMS reports that pt had a fall earlier today. Pt reports that she fell onto her backside after losing her balance, pt rolled and hit her head on the heater when she fell as well. No obvious injuries, pt only c/o tailbone pain. Pt does have a small skin tear on right forearm and right elbow.

## 2014-06-21 NOTE — ED Notes (Signed)
Bed: [facility] Expected date:  Expected time:  Means of arrival:  Comments: EMS-fall 

## 2014-06-21 NOTE — Discharge Instructions (Signed)
Continue home medications as directed. Follow-up with your primary care physician. Return to the ED for new or worsening symptoms.

## 2014-06-21 NOTE — ED Notes (Signed)
[facility] called for transport.  

## 2014-06-22 ENCOUNTER — Encounter: Payer: Self-pay | Admitting: Cardiology

## 2014-06-22 ENCOUNTER — Ambulatory Visit (INDEPENDENT_AMBULATORY_CARE_PROVIDER_SITE_OTHER): Payer: Medicare Other | Admitting: Cardiology

## 2014-06-22 VITALS — BP 130/76 | HR 86 | Ht 64.0 in | Wt 117.0 lb

## 2014-06-22 DIAGNOSIS — I482 Chronic atrial fibrillation, unspecified: Secondary | ICD-10-CM

## 2014-06-22 DIAGNOSIS — I4891 Unspecified atrial fibrillation: Secondary | ICD-10-CM

## 2014-06-22 DIAGNOSIS — I119 Hypertensive heart disease without heart failure: Secondary | ICD-10-CM

## 2014-06-22 DIAGNOSIS — R079 Chest pain, unspecified: Secondary | ICD-10-CM

## 2014-06-22 NOTE — Progress Notes (Signed)
[name] [name] Date of Birth:  09/08/1936 [facility] [address] [place], [place]  [name] [phone]        Fax   [phone]   History of Present Illness: This pleasant 78 year old Caucasian female is seen for a followup office visit. The patient has a history of multiple falls.the patient is a resident at [facility] in the assisted living section.  She has a history of chronic atrial fibrillation. Because of her frequent falls she is not on anticoagulation.  She has had between 18 and 20 falls since being at [facility].  She had a severe fall 3 weeks ago and has a large resolving hematoma of the left forehead.  She fell again yesterday and was taken back to the emergency room for abrasion of the right forearm. She has a pacemaker in place. She has known LVH. She had an echocardiogram on 03/01/13 and showed severe pulmonary hypertension with PA pressure 82 mmHg. her most recent echocardiogram of 08/06/13 showed an ejection fraction of 60-65% and her pulmonary artery pressure was 71 mmHg Her left ventricular ejection fraction was 60-65% and there was mild LVH. Her most recent echocardiogram on 11/18/13 showed an ejection fraction of 55-60% and a pulmonary artery pressure of 60 and she has severe left atrial enlargement and moderate to severe right atrial enlargement. She has a past history of hypertrophic cardiomyopathy although this was not demonstrated on her most recent echo. In the hospital she was felt to be dehydrated and her Lasix was held. Patient is on Lasix 20 mg daily and is not having any evidence of fluid retention at this dose. In the past she has not done well if she gets dehydrated because of her past history of hypertrophic cardiomyopathy.  The patient had a Lexi scan Myoview on 12/14/13 which showed no ischemia and her ejection fraction was 52%.  Because of her frequent falls she has [facility]y [facility]. She has seen Dr. Milon DikesJohn Malone.  Dr. Leanna BattlesMalone  has diagnosed her with severe cervical stenosis which has caused numbness in her legs which is causing her to fall. Her daughter thinks that she will need to move from assisted living to skilled care.   Current Outpatient Prescriptions  Medication Sig Dispense Refill  . acetaminophen (TYLENOL) 500 MG tablet Take 500 mg by mouth every 6 (six) hours as needed for moderate pain.      [name]. albuterol (PROVENTIL HFA;VENTOLIN HFA) 108 (90 BASE) MCG/ACT inhaler Inhale 2 puffs into the lungs every 4 (four) hours as needed (cough).      . budesonide-formoterol (SYMBICORT) 160-4.5 MCG/ACT inhaler Inhale 2 puffs into the lungs 2 (two) times daily.      [name]. buPROPion (WELLBUTRIN XL) 150 MG 24 hr tablet Take 450 mg by mouth daily.       . calcium carbonate (TUMS - DOSED IN MG ELEMENTAL CALCIUM) 500 MG chewable tablet Chew 1 tablet by mouth 4 (four) times daily as needed for indigestion. Only as needed      . diltiazem (CARDIZEM CD) 240 MG 24 hr capsule Take 1 capsule (240 mg total) by mouth daily.  90 capsule  3  . famotidine (PEPCID) 20 MG tablet Take 20 mg by mouth daily as needed for indigestion.      . fluticasone (FLONASE) 50 MCG/ACT nasal spray Place 1 spray into both nostrils daily as needed for allergies.       . furosemide (LASIX) 20 MG tablet  Take 20 mg by mouth daily as needed for fluid.      [name] HYDROcodone-acetaminophen (NORCO/VICODIN) 5-325 MG per tablet Take 1 tablet by mouth every 4 (four) hours as needed for moderate pain.      . hydrOXYzine (VISTARIL) 25 MG capsule Take 25 mg by mouth daily. At noon. And every 8 hours as needed for anxiety      . losartan (COZAAR) 50 MG tablet Take 50 mg by mouth daily.      . metFORMIN (GLUCOPHAGE) 500 MG tablet Take 500 mg by mouth 2 (two) times daily with a meal.      . Multiple Vitamins-Iron (MULTIVITAMIN/IRON PO) Take 1 tablet by mouth daily.      . phenazopyridine (PYRIDIUM) 95 MG tablet Take 95 mg by mouth as needed for pain (for bladder pain.). As directed.       . potassium chloride (KLOR-CON) 20 MEQ packet Take 20 mEq by mouth daily as needed (take with lasix).       [name] sertraline (ZOLOFT) 100 MG tablet Take 200 mg by mouth daily.       . temazepam (RESTORIL) 15 MG capsule Take 15 mg by mouth at bedtime as needed for sleep.      [name] loperamide (IMODIUM) 2 MG capsule Take 2 mg by mouth as needed for diarrhea or loose stools.       No current facility-administered medications for this visit.    Allergies  Allergen Reactions  . Aspirin Nausea And Vomiting  . Calan [Verapamil Hcl] Other (See Comments)    Patient states it makes her "out of her mind"; bp bottoms out (takes diltiazem at home)  . Crestor [Rosuvastatin Calcium] Other (See Comments)    Muscle pain  . Escitalopram Oxalate Other (See Comments)    Reaction Unknown   . Lipitor [Atorvastatin Calcium] Other (See Comments)    myalgia  . Lopressor [Metoprolol Tartrate] Other (See Comments)    Blood pressure bottoms out   . Nitroglycerin Other (See Comments)    Reaction unknown  . Norvasc [Amlodipine Besylate] Other (See Comments)    fatigue  . Nsaids Nausea Only  . Oxycodone Other (See Comments)    Reaction unknown  . Prednisone Swelling  . Sulfa Antibiotics Nausea Only and Other (See Comments)    Makes her stomach hurt  . Vimovo [Naproxen-Esomeprazole] Nausea Only  . Penicillins Hives and Rash    Patient Active Problem List   Diagnosis Date Noted  . Closed head injury 05/30/2014  . Pulmonary hypertension, moderate to severe 05/30/2014  . DM (diabetes mellitus), type 2 with neurological complications 05/30/2014  . Hypoglycemia 05/29/2014  . Hypokalemia 05/29/2014  . Subdural hematoma caused by concussion 05/28/2014  . hemorrhagic cerebral contusion 05/28/2014  . Dyslipidemia 03/07/2014  . Chronic respiratory failure 01/01/2014  . Chest pain   . UTI (urinary tract infection) 11/19/2013  . Compression fracture of L4 lumbar vertebra 11/18/2013  . Syncope 11/18/2013  .  Protein-calorie malnutrition, severe 11/18/2013  . Syncope and collapse 11/17/2013  . Closed compression fracture of L4 lumbar vertebra 11/17/2013  . Gait difficulty 10/05/2013  . Fall (on)(from) incline, initial encounter 10/03/2013  . Essential hypertension, benign 10/03/2013  . Unsteady gait 10/03/2013  . Peripheral neuropathy 10/03/2013  . Back pain 08/06/2013  . Near syncope 08/06/2013  . Pulmonary hypertension 08/05/2013  . Abnormality of gait 05/24/2013  . Acute respiratory distress 05/08/2013  . Pulmonary edema, acute 05/08/2013  . Benign hypertensive heart disease without heart failure   .  Pulmonary HTN   . Oxygen dependent   . COPD (chronic obstructive pulmonary disease)   . Fatigue due to cardiopulmonary and deconditioning 04/23/2013  . Memory deficit 02/07/2013  . Memory change 12/11/2012  . TIA (transient ischemic attack) 02/19/2012  . Chest pain at rest 08/05/2011  . Type II or unspecified type diabetes mellitus without mention of complication, uncontrolled 06/12/2011  . Fall 03/22/2011  . Mitral regurgitation 03/13/2011  . Hypertrophic cardiomyopathy   . Osteoarthritis   . Situational mixed anxiety and depressive disorder   . Pacemaker 02/26/2011  . Atrial fibrillation 01/28/2011  . CHRONIC OBSTRUCTIVE PULMONARY DISEASE, MODERATE 05/23/2010  . DEPRESSIVE DISORDER NOT ELSEWHERE CLASSIFIED 04/11/2010  . HYPERLIPIDEMIA 05/31/2009  . HYPERTENSION 05/31/2009  . ALLERGIC RHINITIS 05/31/2009  . SHORTNESS OF BREATH 05/31/2009  . Cough 05/31/2009    History  Smoking status  . Former Smoker -- 1.00 packs/day for 40 years  . Types: Cigarettes  . Quit date: 11/15/2005  Smokeless tobacco  . Never Used    History  Alcohol Use No    Family History  Problem Relation Age of Onset  . Other Father     died in plane crash  . Cancer Mother     lymphoma-NHL  . Coronary artery disease Grandchild   . Heart disease Maternal Grandmother     Review of  Systems: Constitutional: no fever chills diaphoresis or fatigue or change in weight.  Head and neck: no hearing loss, no epistaxis, no photophobia or visual disturbance. Respiratory: No cough, shortness of breath or wheezing. Cardiovascular: No chest pain peripheral edema, palpitations. Gastrointestinal: No abdominal distention, no abdominal pain, no change in bowel habits hematochezia or melena. Genitourinary: No dysuria, no frequency, no urgency, no nocturia. Musculoskeletal:No arthralgias, no back pain, no gait disturbance or myalgias. Neurological: No dizziness, no headaches, no numbness, no seizures, no syncope, no weakness, no tremors. Hematologic: No lymphadenopathy, no easy bruising. Psychiatric: No confusion, no hallucinations, no sleep disturbance.    Physical Exam: Filed Vitals:   06/22/14 1210  BP: 130/76  Pulse: 86   the general appearance reveals a elderly woman in a wheelchair.  She has lost 6 pounds since we last saw her.  Nasal oxygen in place.The head and neck exam reveals pupils equal and reactive.  Extraocular movements are full.  There is no scleral icterus.  The mouth and pharynx are normal.  The neck is supple.  The carotids reveal no bruits.  The jugular venous pressure is normal.  The  thyroid is not enlarged.  There is no lymphadenopathy.  The chest is clear to percussion and auscultation.  There are no rales or rhonchi.  Expansion of the chest is symmetrical.  There is a pacemaker in left upper chest.  The precordium is quiet.  The pulse is irregularly irregular  The first heart sound is normal.  The second heart sound is physiologically split.  There is soft systolic murmur at the base.  There is no abnormal lift or heave.  The abdomen is soft and nontender.  The bowel sounds are normal.  The liver and spleen are not enlarged.  There are no abdominal masses.  There are no abdominal bruits.  Extremities reveal good pedal pulses.  There is no phlebitis or edema.  There is  no cyanosis or clubbing.  Strength is normal and symmetrical in all extremities.  There is no lateralizing weakness.  There are no sensory deficits.  The skin is warm and dry.  There is no rash.  There is evidence of recent falls with hematoma left forehead and her right forearm is wrapped in bandages from yesterday's fall.     Assessment / Plan: 1. chronic atrial fibrillation 2. tachybradycardia syndrome with pacemaker 3. pulmonary hypertension 4. frequent falls secondary to cervical spondylosis and cervical stenosis, followed by neurology at [facility] 5. essential hypertension 6. diabetes mellitus 7. oxygen-dependent COPD  Disposition continue on same medication.  She is losing weight.  Consider adding Ensure.  Recheck in 4 months for office visit.

## 2014-06-22 NOTE — Patient Instructions (Signed)
Your physician recommends that you continue on your current medications as directed. Please refer to the Current Medication list given to you today.  Your physician recommends that you schedule a follow-up appointment in: 4 month ov  

## 2014-06-22 NOTE — Assessment & Plan Note (Signed)
The patient has not been having any recent chest pain or angina

## 2014-06-22 NOTE — Assessment & Plan Note (Signed)
Blood pressure was remaining stable on current therapy 

## 2014-06-22 NOTE — Assessment & Plan Note (Signed)
The patient is in chronic atrial fibrillation.  She has not had any TIA symptoms.  She is not on anticoagulation because of her frequent falls.

## 2014-06-25 NOTE — ED Provider Notes (Signed)
Medical screening examination/treatment/procedure(s) were conducted as a shared visit with non-physician practitioner(s) and myself.  I personally evaluated the patient during the encounter.   EKG Interpretation None      Briefly, pt is a 78 y.o. female with recurrent falls, most recently today, no LOC.  I performed an exam and history on the pt which included unremarkable cardiac, pulmonary, and gi examination.  Imaging unremarkable.  DC home in stable condition.   Mirian MoMatthew Gentry, MD 06/25/14 [phone]

## 2014-06-27 ENCOUNTER — Telehealth: Payer: Self-pay | Admitting: Internal Medicine

## 2014-06-27 NOTE — Telephone Encounter (Signed)
New message  Pt daughter called reports the pt is at the skilled nursing facility and requests a call back to determine if the physicians pacer check is needed.. Please call

## 2014-06-27 NOTE — Telephone Encounter (Signed)
Pt daughter stated that it is going to be harder to transport. I informed pt daughter that pt has not been seen by EP MD since 06 -2014 and that pt needs to keep scheduled appt b/c it is necessary that pt see EP MD every year. PT daughter verbalized understanding.

## 2014-06-27 NOTE — Telephone Encounter (Signed)
Will forward to Device Triage.

## 2014-06-30 ENCOUNTER — Ambulatory Visit: Payer: Medicare Other | Admitting: Nurse Practitioner

## 2014-07-04 ENCOUNTER — Ambulatory Visit: Payer: Medicare Other | Admitting: Cardiology

## 2014-07-12 ENCOUNTER — Ambulatory Visit (INDEPENDENT_AMBULATORY_CARE_PROVIDER_SITE_OTHER): Payer: Medicare Other | Admitting: Internal Medicine

## 2014-07-12 ENCOUNTER — Encounter: Payer: Self-pay | Admitting: Internal Medicine

## 2014-07-12 VITALS — BP 124/72 | HR 91 | Ht 64.0 in | Wt 120.6 lb

## 2014-07-12 DIAGNOSIS — I4891 Unspecified atrial fibrillation: Secondary | ICD-10-CM

## 2014-07-12 DIAGNOSIS — I482 Chronic atrial fibrillation, unspecified: Secondary | ICD-10-CM

## 2014-07-12 DIAGNOSIS — Z950 Presence of cardiac pacemaker: Secondary | ICD-10-CM

## 2014-07-12 LAB — MDC_IDC_ENUM_SESS_TYPE_INCLINIC
Battery Impedance: 221 Ohm
Battery Remaining Longevity: 128 mo
Battery Voltage: 2.79 V
Lead Channel Impedance Value: 387 Ohm
Lead Channel Setting Pacing Amplitude: 2.5 V
Lead Channel Setting Pacing Pulse Width: 0.76 ms
Lead Channel Setting Sensing Sensitivity: 4 mV
MDC IDC MSMT LEADCHNL RA IMPEDANCE VALUE: 67 Ohm
MDC IDC MSMT LEADCHNL RV PACING THRESHOLD AMPLITUDE: 1 V
MDC IDC MSMT LEADCHNL RV PACING THRESHOLD PULSEWIDTH: 0.76 ms
MDC IDC MSMT LEADCHNL RV SENSING INTR AMPL: 8 mV
MDC IDC SESS DTM: 20150901150110
MDC IDC STAT BRADY RV PERCENT PACED: 11 %

## 2014-07-12 NOTE — Patient Instructions (Signed)
Your physician recommends that you continue on your current medications as directed. Please refer to the Current Medication list given to you today.  Remote monitoring is used to monitor your Pacemaker of ICD from home. This monitoring reduces the number of office visits required to check your device to one time per year. It allows us to keep an eye on the functioning of your device to ensure it is working properly. You are scheduled for a device check from home on 10/13/14. You may send your transmission at any time that day. If you have a wireless device, the transmission will be sent automatically. After your physician reviews your transmission, you will receive a postcard with your next transmission date.  Your physician wants you to follow-up in: 1 year with Dr. Klein.  You will receive a reminder letter in the mail two months in advance. If you don't receive a letter, please call our office to schedule the follow-up appointment.  

## 2014-07-12 NOTE — Progress Notes (Signed)
Patient Care Team: Lupe Carney, MD as PCP - General   HPI  [name] [name] is a 78 y.o. female Seen in pacemaker followup. She initially underwent device implantation in 1998. She underwent generator replacement in 2006 receiving a Medtronic and a device. Using the previously implanted leads.   She has permanent atrial fibrillation.   She has complaints of dyspnea. She has  oxygen-dependent COPD.   The litany of her interval history is well outlined in Dr TB most recent note.        Past Medical History  Diagnosis Date  . Hypertrophic cardiomyopathy     a. 11/2013 Echo: EF 55-60%, basal inf HK, sev dil LA, no evidence of HCM.  PASP .  [name] Hypertension   . Situational mixed anxiety and depressive disorder   . Hypercholesterolemia   . Falls frequently     coumadin stopped  . Pulmonary HTN     a. has had prior right heart cath in 2010; was felt that most likely due to elevated left sided pressures and would not benefit from vasodilator therapy;  b. 11/2013 Echo: PASP .  [name] Oxygen dependent     "3L 24/7" (08/05/2013)  . COPD (chronic obstructive pulmonary disease)   . TIA (transient ischemic attack)   . Atrial fibrillation     a. Chronic; No longer on coumadin 2/2 frequent falls.  . CHF (congestive heart failure)   . Pacemaker     a. 04/2012 MDT [id number] [employer] PPM, ser #: [id number] H.  . Varicose veins   . History of pneumonia     "couple times; long time ago" (08/05/2013)  . Type II diabetes mellitus   . Anemia     "as a child" (08/05/2013)  . GERD (gastroesophageal reflux disease)   . H/O hiatal hernia   . Osteoarthritis   . Arthritis     "all over me; in all my joints" (08/05/2013)  . Coccyx pain   . Gout     "right foot" (08/05/2013)  . Anxiety   . Depression   . Chest pain     a. 08/2011 Myoview: sm, fixed anteroseptal defect (attenuation), no ischemia, EF 62%.    Past Surgical History  Procedure Laterality Date  . Total knee arthroplasty Right 2011  .  Tonsillectomy    . Appendectomy    . Abdominal hysterectomy      "partial" (08/05/2013)  . Dilation and curettage of uterus      "several" (08/05/2013)  . Foot neuroma surgery Right   . Insert / replace / remove pacemaker  1998; 2000's; 2014  . Cardiac catheterization      "more than once" (08/05/2013)  . Back surgery      "bone spur removed; mid back" (08/05/2013)    Current Outpatient Prescriptions  Medication Sig Dispense Refill  . acetaminophen (TYLENOL) 500 MG tablet Take 500 mg by mouth every 6 (six) hours as needed for moderate pain.      [name] albuterol (PROVENTIL HFA;VENTOLIN HFA) 108 (90 BASE) MCG/ACT inhaler Inhale 2 puffs into the lungs every 4 (four) hours as needed (cough).      . budesonide-formoterol (SYMBICORT) 160-4.5 MCG/ACT inhaler Inhale 2 puffs into the lungs 2 (two) times daily.      [name] buPROPion (WELLBUTRIN XL) 150 MG 24 hr tablet Take 450 mg by mouth daily.       . calcium carbonate (TUMS - DOSED IN MG ELEMENTAL CALCIUM) 500 MG chewable tablet Chew 1 tablet by mouth 4 (  four) times daily as needed for indigestion. Only as needed      . diltiazem (CARDIZEM CD) 240 MG 24 hr capsule Take 1 capsule (240 mg total) by mouth daily.  90 capsule  3  . famotidine (PEPCID) 20 MG tablet Take 20 mg by mouth daily as needed for indigestion.      . fluticasone (FLONASE) 50 MCG/ACT nasal spray Place 1 spray into both nostrils daily as needed for allergies.       . furosemide (LASIX) 20 MG tablet Take 20 mg by mouth daily as needed for fluid.      [name] HYDROcodone-acetaminophen (NORCO/VICODIN) 5-325 MG per tablet Take 1 tablet by mouth every 4 (four) hours as needed for moderate pain.      . hydrOXYzine (VISTARIL) 25 MG capsule Take 25 mg by mouth daily. At noon. And every 8 hours as needed for anxiety      . losartan (COZAAR) 50 MG tablet Take 50 mg by mouth daily.      . metFORMIN (GLUCOPHAGE) 500 MG tablet Take 500 mg by mouth 2 (two) times daily with a meal.      . Multiple Vitamins-Iron  (MULTIVITAMIN/IRON PO) Take 1 tablet by mouth daily.      . phenazopyridine (PYRIDIUM) 95 MG tablet Take 95 mg by mouth as needed for pain (for bladder pain.). As directed.      . potassium chloride (KLOR-CON) 20 MEQ packet Take 20 mEq by mouth daily as needed (take with lasix).       [name] sertraline (ZOLOFT) 100 MG tablet Take 200 mg by mouth as needed (for depression and anxiety).        No current facility-administered medications for this visit.    Allergies  Allergen Reactions  . Aspirin Nausea And Vomiting  . Calan [Verapamil Hcl] Other (See Comments)    Patient states it makes her "out of her mind"; bp bottoms out (takes diltiazem at home)  . Crestor [Rosuvastatin Calcium] Other (See Comments)    Muscle pain  . Escitalopram Oxalate Other (See Comments)    Reaction Unknown   . Lipitor [Atorvastatin Calcium] Other (See Comments)    myalgia  . Lopressor [Metoprolol Tartrate] Other (See Comments)    Blood pressure bottoms out   . Nitroglycerin Other (See Comments)    Reaction unknown  . Norvasc [Amlodipine Besylate] Other (See Comments)    fatigue  . Nsaids Nausea Only  . Oxycodone Other (See Comments)    Reaction unknown  . Prednisone Swelling  . Sulfa Antibiotics Nausea Only and Other (See Comments)    Makes her stomach hurt  . Vimovo [Naproxen-Esomeprazole] Nausea Only  . Penicillins Hives and Rash    Review of Systems negative except from HPI and PMH  Physical Exam BP 124/72  Pulse 91  Ht  (1.626 m)  Wt 120 lb 9.6 oz (54.704 kg)  BMI 20.69 kg/m2 Well developed and nourished wearing oxygen sitting in wheel chair HENT normal Neck supple with JVP-flat Clear Regular rate and rhythm, no murmurs or gallops Abd-soft with active BS No Clubbing cyanosis edema Skin-warm and dry A & Oriented  Grossly normal sensory and motor function  ECG demonstrates atrial fibrillation at 91 Intervals-I/09/36 LVH with repolarization   Assessment and  Plan  Atrial  fibrillation permanent  Bradycardia  Pacemaker - Medtronic  The patient's device was interrogated.  The information was reviewed. No changes were made in the programming.    10% ventricular paced

## 2014-07-20 ENCOUNTER — Encounter: Payer: Self-pay | Admitting: Internal Medicine

## 2014-07-23 ENCOUNTER — Other Ambulatory Visit: Payer: Self-pay | Admitting: Cardiology

## 2014-08-05 IMAGING — CR DG LUMBAR SPINE COMPLETE 4+V
5 series · 5 of 5 positions shown · non-contrast
Comparison: Radiographs dated 01/06/2012

CLINICAL DATA: Low back and left hip pain.

LUMBAR SPINE - COMPLETE 4+ VIEW

[t l-spine a.p.]
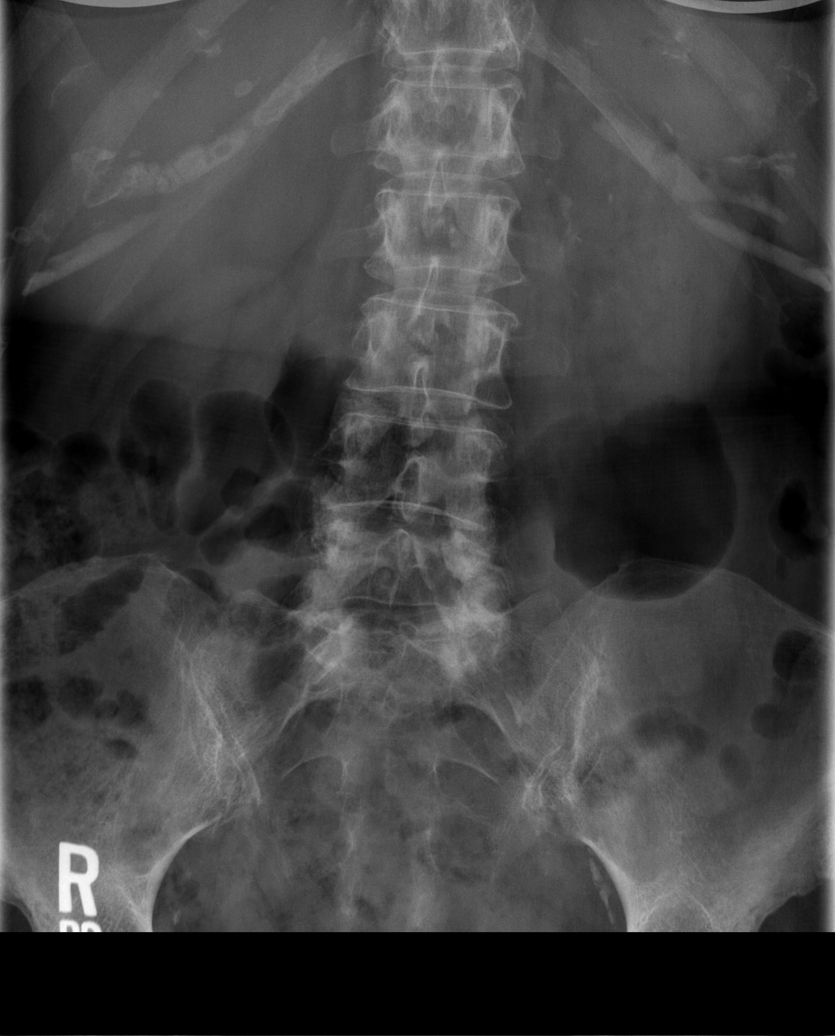

[t l-spine oblique exposure (1 of 2)]
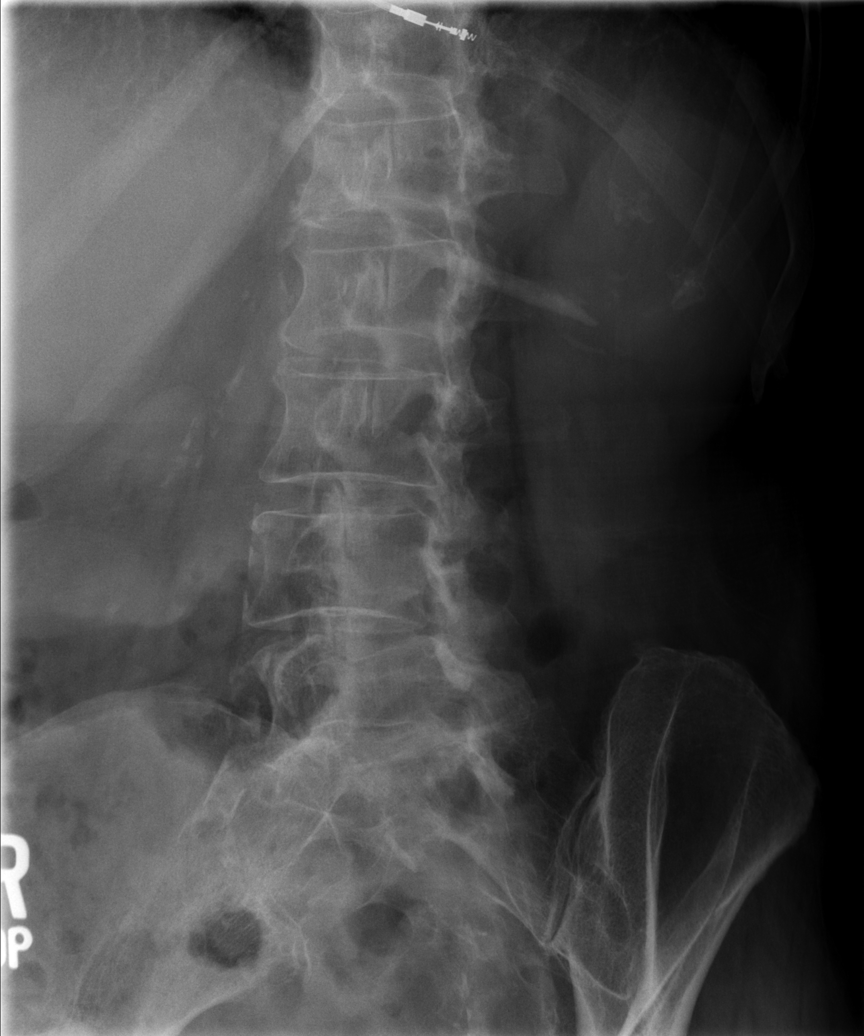

[t l-spine oblique exposure (2 of 2)]
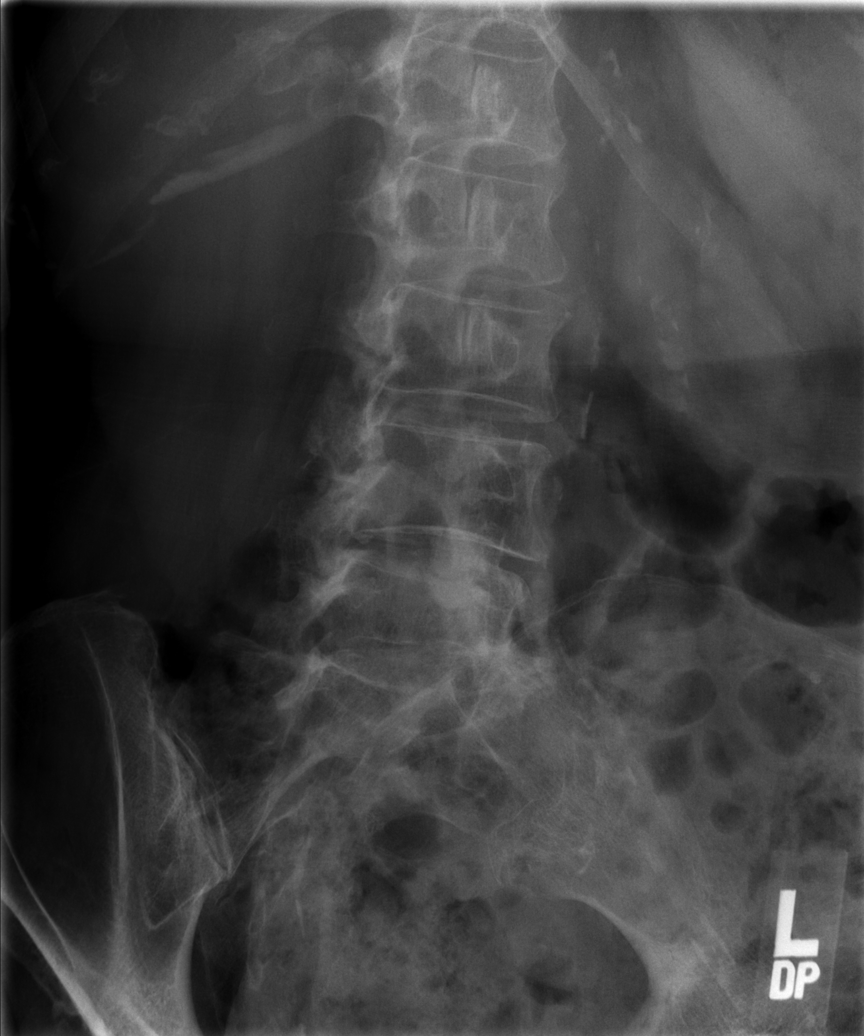

[t l-spine lat]
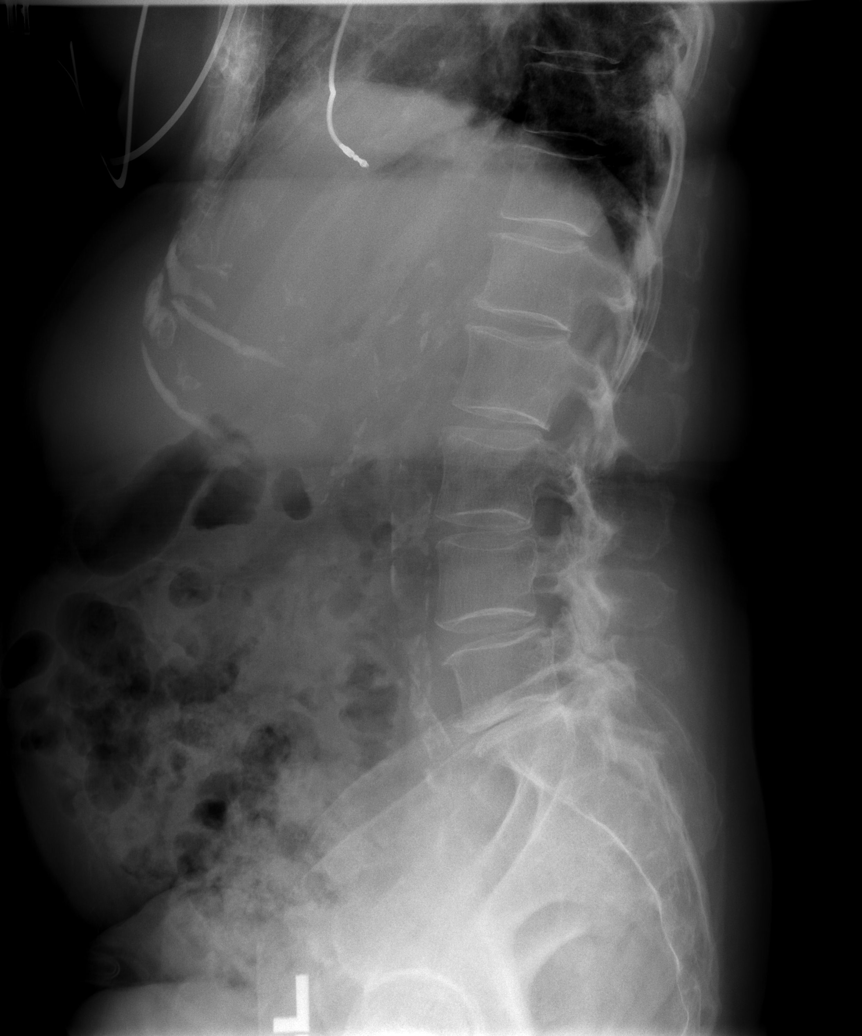

[t l-spine l5-s1 spot]
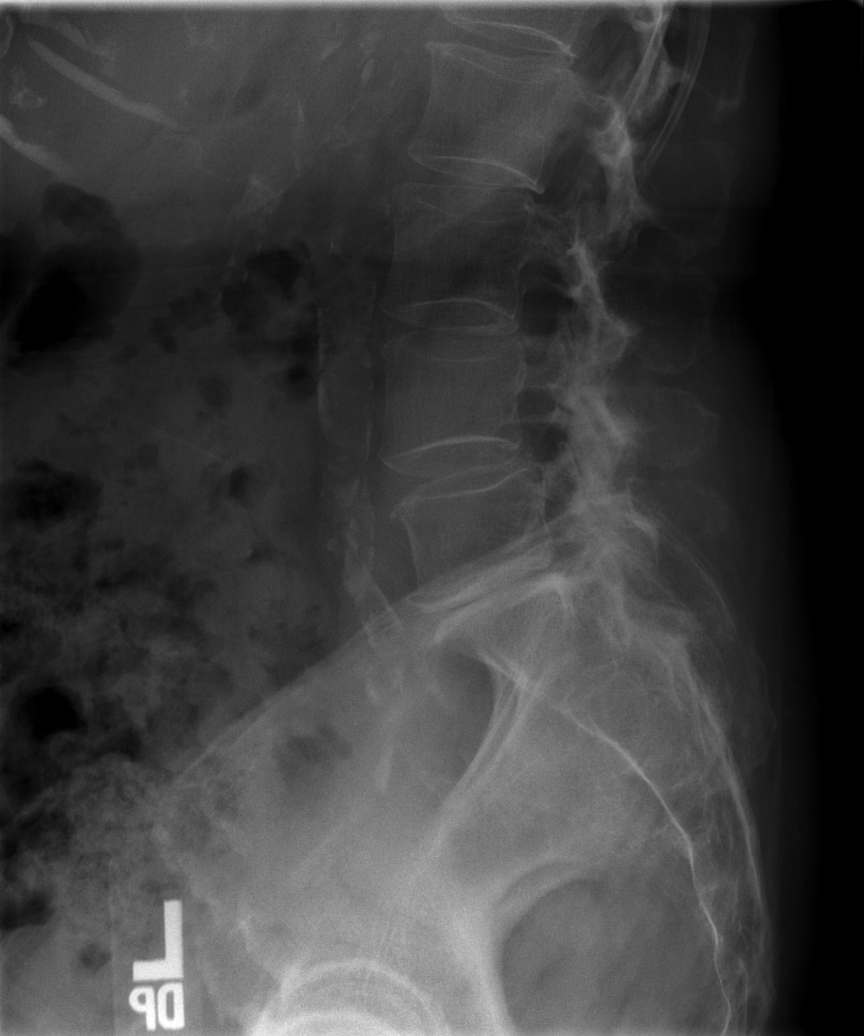

[5 of 5 positions shown; findings below may reference images not displayed]

FINDINGS: There is no fracture or other acute abnormality.  Severe
bilateral facet arthritis at L4-5 with grade 1 spondylolisthesis,
unchanged.  No disc space narrowing at L4-5.

Marked disc space narrowing at L5-S1 with moderate bilateral facet
arthritis.  This is also unchanged.

Moderate bilateral facet arthritis at L3-4, unchanged.
IMPRESSION: Multilevel facet arthritis in the lower lumbar spine with
degenerative disc disease at L5-S1.  No acute abnormalities.

## 2014-08-29 ENCOUNTER — Emergency Department (HOSPITAL_COMMUNITY): Payer: Medicare Other

## 2014-08-29 ENCOUNTER — Emergency Department (HOSPITAL_COMMUNITY)
Admission: EM | Admit: 2014-08-29 | Discharge: 2014-08-29 | Disposition: A | Discharge status: Home / Self Care | Payer: Medicare Other | Attending: Emergency Medicine | Admitting: Emergency Medicine

## 2014-08-29 ENCOUNTER — Encounter (HOSPITAL_COMMUNITY): Payer: Self-pay | Admitting: Emergency Medicine

## 2014-08-29 DIAGNOSIS — S50811A Abrasion of right forearm, initial encounter: Secondary | ICD-10-CM | POA: Insufficient documentation

## 2014-08-29 DIAGNOSIS — Z79899 Other long term (current) drug therapy: Secondary | ICD-10-CM | POA: Diagnosis not present

## 2014-08-29 DIAGNOSIS — R51 Headache: Secondary | ICD-10-CM | POA: Insufficient documentation

## 2014-08-29 DIAGNOSIS — S8001XA Contusion of right knee, initial encounter: Secondary | ICD-10-CM | POA: Diagnosis not present

## 2014-08-29 DIAGNOSIS — E119 Type 2 diabetes mellitus without complications: Secondary | ICD-10-CM | POA: Insufficient documentation

## 2014-08-29 DIAGNOSIS — M1990 Unspecified osteoarthritis, unspecified site: Secondary | ICD-10-CM | POA: Insufficient documentation

## 2014-08-29 DIAGNOSIS — Y9389 Activity, other specified: Secondary | ICD-10-CM | POA: Insufficient documentation

## 2014-08-29 DIAGNOSIS — F329 Major depressive disorder, single episode, unspecified: Secondary | ICD-10-CM | POA: Insufficient documentation

## 2014-08-29 DIAGNOSIS — M545 Low back pain, unspecified: Secondary | ICD-10-CM

## 2014-08-29 DIAGNOSIS — I10 Essential (primary) hypertension: Secondary | ICD-10-CM | POA: Diagnosis not present

## 2014-08-29 DIAGNOSIS — Z862 Personal history of diseases of the blood and blood-forming organs and certain disorders involving the immune mechanism: Secondary | ICD-10-CM | POA: Insufficient documentation

## 2014-08-29 DIAGNOSIS — F419 Anxiety disorder, unspecified: Secondary | ICD-10-CM | POA: Diagnosis not present

## 2014-08-29 DIAGNOSIS — S0181XA Laceration without foreign body of other part of head, initial encounter: Secondary | ICD-10-CM | POA: Diagnosis not present

## 2014-08-29 DIAGNOSIS — W06XXXA Fall from bed, initial encounter: Secondary | ICD-10-CM | POA: Insufficient documentation

## 2014-08-29 DIAGNOSIS — Z950 Presence of cardiac pacemaker: Secondary | ICD-10-CM | POA: Diagnosis not present

## 2014-08-29 DIAGNOSIS — Z8701 Personal history of pneumonia (recurrent): Secondary | ICD-10-CM | POA: Insufficient documentation

## 2014-08-29 DIAGNOSIS — E780 Pure hypercholesterolemia: Secondary | ICD-10-CM | POA: Diagnosis not present

## 2014-08-29 DIAGNOSIS — I509 Heart failure, unspecified: Secondary | ICD-10-CM | POA: Diagnosis not present

## 2014-08-29 DIAGNOSIS — Y929 Unspecified place or not applicable: Secondary | ICD-10-CM | POA: Insufficient documentation

## 2014-08-29 DIAGNOSIS — S3992XA Unspecified injury of lower back, initial encounter: Secondary | ICD-10-CM | POA: Diagnosis not present

## 2014-08-29 DIAGNOSIS — Z7951 Long term (current) use of inhaled steroids: Secondary | ICD-10-CM | POA: Insufficient documentation

## 2014-08-29 DIAGNOSIS — Z8673 Personal history of transient ischemic attack (TIA), and cerebral infarction without residual deficits: Secondary | ICD-10-CM | POA: Insufficient documentation

## 2014-08-29 DIAGNOSIS — J449 Chronic obstructive pulmonary disease, unspecified: Secondary | ICD-10-CM | POA: Diagnosis not present

## 2014-08-29 DIAGNOSIS — Z86718 Personal history of other venous thrombosis and embolism: Secondary | ICD-10-CM | POA: Insufficient documentation

## 2014-08-29 DIAGNOSIS — S8002XA Contusion of left knee, initial encounter: Secondary | ICD-10-CM | POA: Diagnosis not present

## 2014-08-29 MED ORDER — HYDROCODONE-ACETAMINOPHEN 5-325 MG PO TABS: 2.0000 | ORAL_TABLET | ORAL | Status: DC | PRN

## 2014-08-29 MED ORDER — HYDROCODONE-ACETAMINOPHEN 5-325 MG PO TABS
2.0000 | ORAL_TABLET | Freq: Once | ORAL | Status: AC
  Administered 2014-08-29: 1 via ORAL
  Filled 2014-08-29: qty 2

## 2014-08-29 NOTE — ED Notes (Signed)
Pt has three skin tears to the right forearm. (Large skin tear noted to right anterior forearm, 2 small skin tears to posterior forearm)  Large hematoma to medial of forehead with an abrasion in the center of hematoma. Upper lip laceration noted with bleeding.

## 2014-08-29 NOTE — Discharge Instructions (Signed)
Contusion A contusion is a deep bruise. Contusions are the result of an injury that caused bleeding under the skin. The contusion may turn blue, purple, or yellow. Minor injuries will give you a painless contusion, but more severe contusions may stay painful and swollen for a few weeks.  CAUSES  A contusion is usually caused by a blow, trauma, or direct force to an area of the body. SYMPTOMS   Swelling and redness of the injured area.  Bruising of the injured area.  Tenderness and soreness of the injured area.  Pain. DIAGNOSIS  The diagnosis can be made by taking a history and physical exam. An X-ray, CT scan, or MRI may be needed to determine if there were any associated injuries, such as fractures. TREATMENT  Specific treatment will depend on what area of the body was injured. In general, the best treatment for a contusion is resting, icing, elevating, and applying cold compresses to the injured area. Over-the-counter medicines may also be recommended for pain control. Ask your caregiver what the best treatment is for your contusion. HOME CARE INSTRUCTIONS   Put ice on the injured area.  Put ice in a plastic bag.  Place a towel between your skin and the bag.  Leave the ice on for 15-20 minutes, 3-4 times a day, or as directed by your health care provider.  Only take over-the-counter or prescription medicines for pain, discomfort, or fever as directed by your caregiver. Your caregiver may recommend avoiding anti-inflammatory medicines (aspirin, ibuprofen, and naproxen) for 48 hours because these medicines may increase bruising.  Rest the injured area.  If possible, elevate the injured area to reduce swelling. SEEK IMMEDIATE MEDICAL CARE IF:   You have increased bruising or swelling.  You have pain that is getting worse.  Your swelling or pain is not relieved with medicines. MAKE SURE YOU:   Understand these instructions.  Will watch your condition.  Will get help right  away if you are not doing well or get worse. Document Released: 08/07/2005 Document Revised: 11/02/2013 Document Reviewed: 09/02/2011 [facility] Patient Information 2015 [place], [place]. This information is not intended to replace advice given to you by your health care provider. Make sure you discuss any questions you have with your health care provider. Laceration Care, Adult A laceration is a cut or lesion that goes through all layers of the skin and into the tissue just beneath the skin. TREATMENT  Some lacerations may not require closure. Some lacerations may not be able to be closed due to an increased risk of infection. It is important to see your caregiver as soon as possible after an injury to minimize the risk of infection and maximize the opportunity for successful closure. If closure is appropriate, pain medicines may be given, if needed. The wound will be cleaned to help prevent infection. Your caregiver will use stitches (sutures), staples, wound glue (adhesive), or skin adhesive strips to repair the laceration. These tools bring the skin edges together to allow for faster healing and a better cosmetic outcome. However, all wounds will heal with a scar. Once the wound has healed, scarring can be minimized by covering the wound with sunscreen during the day for 1 full year. HOME CARE INSTRUCTIONS  For sutures or staples:  Keep the wound clean and dry.  If you were given a bandage (dressing), you should change it at least once a day. Also, change the dressing if it becomes wet or dirty, or as directed by your caregiver.  Wash  the wound with soap and water 2 times a day. Rinse the wound off with water to remove all soap. Pat the wound dry with a clean towel.  After cleaning, apply a thin layer of the antibiotic ointment as recommended by your caregiver. This will help prevent infection and keep the dressing from sticking.  You may shower as usual after the first 24 hours. Do not soak the  wound in water until the sutures are removed.  Only take over-the-counter or prescription medicines for pain, discomfort, or fever as directed by your caregiver.  Get your sutures or staples removed as directed by your caregiver. For skin adhesive strips:  Keep the wound clean and dry.  Do not get the skin adhesive strips wet. You may bathe carefully, using caution to keep the wound dry.  If the wound gets wet, pat it dry with a clean towel.  Skin adhesive strips will fall off on their own. You may trim the strips as the wound heals. Do not remove skin adhesive strips that are still stuck to the wound. They will fall off in time. For wound adhesive:  You may briefly wet your wound in the shower or bath. Do not soak or scrub the wound. Do not swim. Avoid periods of heavy perspiration until the skin adhesive has fallen off on its own. After showering or bathing, gently pat the wound dry with a clean towel.  Do not apply liquid medicine, cream medicine, or ointment medicine to your wound while the skin adhesive is in place. This may loosen the film before your wound is healed.  If a dressing is placed over the wound, be careful not to apply tape directly over the skin adhesive. This may cause the adhesive to be pulled off before the wound is healed.  Avoid prolonged exposure to sunlight or tanning lamps while the skin adhesive is in place. Exposure to ultraviolet light in the first year will darken the scar.  The skin adhesive will usually remain in place for 5 to 10 days, then naturally fall off the skin. Do not pick at the adhesive film. You may need a tetanus shot if:  You cannot remember when you had your last tetanus shot.  You have never had a tetanus shot. If you get a tetanus shot, your arm may swell, get red, and feel warm to the touch. This is common and not a problem. If you need a tetanus shot and you choose not to have one, there is a rare chance of getting tetanus. Sickness  from tetanus can be serious. SEEK MEDICAL CARE IF:   You have redness, swelling, or increasing pain in the wound.  You see a red line that goes away from the wound.  You have yellowish-white fluid (pus) coming from the wound.  You have a fever.  You notice a bad smell coming from the wound or dressing.  Your wound breaks open before or after sutures have been removed.  You notice something coming out of the wound such as wood or glass.  Your wound is on your hand or foot and you cannot move a finger or toe. SEEK IMMEDIATE MEDICAL CARE IF:   Your pain is not controlled with prescribed medicine.  You have severe swelling around the wound causing pain and numbness or a change in color in your arm, hand, leg, or foot.  Your wound splits open and starts bleeding.  You have worsening numbness, weakness, or loss of function of any joint  around or beyond the wound.  You develop painful lumps near the wound or on the skin anywhere on your body. MAKE SURE YOU:   Understand these instructions.  Will watch your condition.  Will get help right away if you are not doing well or get worse. Document Released: 10/28/2005 Document Revised: 01/20/2012 Document Reviewed: 04/23/2011 [facility] Patient Information 2015 [place], [place]. This information is not intended to replace advice given to you by your health care provider. Make sure you discuss any questions you have with your health care provider. Contusion A contusion is a deep bruise. Contusions are the result of an injury that caused bleeding under the skin. The contusion may turn blue, purple, or yellow. Minor injuries will give you a painless contusion, but more severe contusions may stay painful and swollen for a few weeks.  CAUSES  A contusion is usually caused by a blow, trauma, or direct force to an area of the body. SYMPTOMS   Swelling and redness of the injured area.  Bruising of the injured area.  Tenderness and soreness of  the injured area.  Pain. DIAGNOSIS  The diagnosis can be made by taking a history and physical exam. An X-ray, CT scan, or MRI may be needed to determine if there were any associated injuries, such as fractures. TREATMENT  Specific treatment will depend on what area of the body was injured. In general, the best treatment for a contusion is resting, icing, elevating, and applying cold compresses to the injured area. Over-the-counter medicines may also be recommended for pain control. Ask your caregiver what the best treatment is for your contusion. HOME CARE INSTRUCTIONS   Put ice on the injured area.  Put ice in a plastic bag.  Place a towel between your skin and the bag.  Leave the ice on for 15-20 minutes, 3-4 times a day, or as directed by your health care provider.  Only take over-the-counter or prescription medicines for pain, discomfort, or fever as directed by your caregiver. Your caregiver may recommend avoiding anti-inflammatory medicines (aspirin, ibuprofen, and naproxen) for 48 hours because these medicines may increase bruising.  Rest the injured area.  If possible, elevate the injured area to reduce swelling. SEEK IMMEDIATE MEDICAL CARE IF:   You have increased bruising or swelling.  You have pain that is getting worse.  Your swelling or pain is not relieved with medicines. MAKE SURE YOU:   Understand these instructions.  Will watch your condition.  Will get help right away if you are not doing well or get worse. Document Released: 08/07/2005 Document Revised: 11/02/2013 Document Reviewed: 09/02/2011 [facility] Patient Information 2015 [place], [place]. This information is not intended to replace advice given to you by your health care provider. Make sure you discuss any questions you have with your health care provider.  Emergency Department Resource Guide 1) Find a Doctor and Pay Out of Pocket Although you won't have to find out who is covered by your insurance  plan, it is a good idea to ask around and get recommendations. You will then need to call the office and see if the doctor you have chosen will accept you as a new patient and what types of options they offer for patients who are self-pay. Some doctors offer discounts or will set up payment plans for their patients who do not have insurance, but you will need to ask so you aren't surprised when you get to your appointment.  2) Contact Your Local Health Department Not all health  departments have doctors that can see patients for sick visits, but many do, so it is worth a call to see if yours does. If you don't know where your local health department is, you can check in your phone book. The CDC also has a tool to help you locate your state's health department, and many state websites also have listings of all of their local health departments.  3) Find a Walk-in Clinic If your illness is not likely to be very severe or complicated, you may want to try a walk in clinic. These are popping up all over the country in pharmacies, drugstores, and shopping centers. They're usually staffed by nurse practitioners or physician assistants that have been trained to treat common illnesses and complaints. They're usually fairly quick and inexpensive. However, if you have serious medical issues or chronic medical problems, these are probably not your best option.  No Primary Care Doctor: - Call Health Connect at  [phone] - they can help you locate a primary care doctor that  accepts your insurance, provides certain services, etc. - Physician Referral Service- [phone]  Chronic Pain Problems: Organization         Address  Phone   Notes  [facility]  [phone] Patients need to be referred by their primary care doctor.   Medication Assistance: Organization         Address  Phone   Notes  [facility] Medication [facility] [address] [place] [place], [place] [address]  [phone] --Must be a resident of [facility] -- Must have NO insurance coverage whatsoever (no Medicaid/ Medicare, etc.) -- The pt. MUST have a primary care doctor that directs their care regularly and follows them in the community   [facility]  [phone]   [employer]  [phone]    Agencies that provide inexpensive medical care: Organization         Address  Phone   Notes  [facility]  [phone]   [facility]    [phone]   [facility] [address] [place], [place] [address] [phone]   [facility] [address] [place], [place] [phone]   Planned Parenthood    [phone]   [facility]    [phone]   [facility]  [address], [place] Phone:  [phone], Fax:  [phone] Hours of Operation:  9 am - 6 pm, M-F.  Also accepts Medicaid/Medicare and self-pay.  [facility]  [address], [place] Phone: [phone], Fax: [phone]. Hours of Operation:  8:30 am - 5:30 pm, M-F.  Also accepts Medicaid and self-pay.  [facility] [place] [address] [place] Phone: [phone]   [facility] [address] Natasha Bence [place], [place] [phone], Ext. [phone] Mondays & Thursdays: 7-9 AM.  First 15 patients are seen on a first come, first serve basis.    Medicaid-accepting [facility] Providers:  Organization         Address  Phone   Notes  [facility] [address],  [phone] Also accepts self-pay patients.  [facility] [address] Laurell Josephs [place], [place]  [phone]   [facility] [address], [place] [phone]   [facility]  [address] [place] [address], [place] [phone]) [phone]   Renaye RakersVeita Bland [address], [place]   [phone]  [phone] Only accepts [place] Access [place] patients after they have their name applied to their card.   Self-Pay (no insurance) in [facility]:  Organization         Address  Phone   Notes  Sickle Cell Patients, [facility] [address], [place] [phone]   [facility] [address], [place] [phone]) [phone]   [facility]  [address], [place] [phone]   [facility]/Dr. Osei-Bonsu  [address] [place] [address], [place] or [phone], [place] [phone] Phone number for both [place] and [place] locations is the same.  [facility] [address], [place] [phone]   [facility] [address] [place] [address], [place] or [address] [phone]) [phone]   [facility] [address], [place] [phone](336) [phone], phone; [phone], fax Sees patients 1st and 3rd Saturday of every month.  Must not qualify for public or private insurance (i.e. Medicaid, Medicare, [place] Health Choice, Veterans' Benefits)  Household income should be no more than 200% of the poverty level The clinic cannot treat you if you are pregnant or think you are pregnant  Sexually transmitted diseases are not treated at the clinic.    Dental Care: Organization         Address  Phone  Notes  [facility] [address], [place] [phone] Accepts children up to age 76 who are enrolled in [place] or [place] Health Choice; pregnant women with a Medicaid card; and children who have applied for Medicaid or [place] Health Choice, but were declined, whose parents can pay a reduced fee at time of service.  [facility] [place]  [address], [place] [phone] Accepts children up to age 76 who are enrolled in [place] or [place] Health  Choice; pregnant women with a Medicaid card; and children who have applied for Medicaid or [place] Health Choice, but were declined, whose parents can pay a reduced fee at time of service.  [facility]  [address], [place] [phone] Patients are seen by appointment only. Walk-ins are not accepted. [facility] will see patients 76 years of age and older. Monday - Tuesday (8am-5pm) Most Wednesdays (8:30-5pm) $30 per visit, cash only  [facility]  [address], [facility] [place] [phone] Patients are seen by appointment only. Walk-ins are not accepted. [facility] will see patients 76 years of age and older. One Wednesday Evening (Monthly: Volunteer Based).  $30 per visit, cash only  [employer] [facility] [facility]  [phone] for adults; Children under age 324, call Graduate Pediatric Dentistry at [phone]. Children aged 104-14, please call [phone] to request a pediatric application.  Dental services are provided in all areas of dental care including fillings, crowns and bridges, complete and partial dentures, implants, gum treatment, root canals, and extractions. Preventive care is also provided. Treatment is provided to both adults and children. Patients are selected via a lottery and there is often a waiting list.   [facility] [address], [place]  [phone] [email or web address]   [facility]  [address], [place], [place] [phone], Ext. [phone] Second and Fourth Thursday of each month, opens at 6:30 AM; Clinic ends at 9 AM.  Patients are seen on a first-come first-served basis, and a limited number are seen during each clinic.   [facility]  [address], [place], [place] [phone]   Eligibility Requirements You must have lived in [place], [place], or [place] counties for at least the last three months.   You cannot be eligible for state or  federal sponsored [place] Cityhealthcare insurance, including CIGNAVeterans Administration, [place]icaid, or [employer] EntertainmentMedicare.   You generally cannot be eligible for healthcare insurance through your employer.    How to apply: Eligibility screenings are held every Tuesday and Wednesday afternoon from 1:00 pm until 4:00 pm. You do not need an appointment for the interview!  [facility] [address], [place], [place] [phone]   [facility]  [phone]   [facility] [facility]  [phone]   [facility]  [phone]    Behavioral Health Resources in the Community: Intensive Outpatient Programs Organization         Address  Phone  Notes  [facility] [address], [place], [place] [phone]   [facility] [address], [place], [place] [phone]   [facility] [address], [place], [place]  [phone]   [facility] [address],  [place], [place] [phone] or [phone]   Substance Abuse Resources Organization         Address  Phone  Notes  [facility]  [phone]   [facility]  [phone]   [facility]  [phone]   [facility]  [phone]   Residential & Outpatient Substance Abuse Program  [phone]   Psychological Services Organization         Address  Phone  Notes  [facility]  [phone]- [phone]   [facility]  [phone]- [phone]   [facility] [address] [place] [address], [place] [phone] or [phone]    Mobile Crisis Teams Organization         Address  Phone  Notes  [facility], Mobile Crisis Care Unit  [phone]   [facility]  [address] [place], [place] [phone]   Doristine LocksSharon DeEsch [address] [place] [phone]    Self-Help/Support Groups Organization         Address  Phone              Notes  [facility] [place] - variety of support groups  [phone]- [phone] Call for more information  Narcotics Anonymous (NA), [facility] [address], [place]  2 meetings at this location   Statisticianesidential Treatment Programs Organization         Address  Phone  Notes  [facility] [address],    [place]  [phone]   [facility]  [address], [place] [phone], [place], [place] [phone]   [facility] [address], [place] [phone] Admissions: 8am-3pm M-F  [facility] [address]    [place], [place] [phone]   [facility] [address], [place], [place] [phone]   [facility] [address]  [place], [place] [phone]   [facility] - Intensive Outpatient [phone],  [place], [place] [phone]   [facility] ([facility] [address] [place].,  [place], [place] [phone] or [phone]   [facility] (RTS) [address], [place], [place] [phone] Accepts Medicaid  [facility] [address]  [place] [phone] Substance Abuse/Addiction Treatment   [facility] Organization         Address  Phone  Notes  [facility]  [phone]   Angie Fava, PhD [address] Ervin Knack [place], [place]   [phone] or [phone]   [facility]   [address] [place], [place] [phone]   [facility] [address], [place], [place] [phone] Insurance/Medicaid/sponsorship through [employer] [facility] [address]                                    [place], [place] [phone] Therapy/tele-psych/case  [facility] [address], [place] [phone]    Dr. Lolly Mustache  [phone]   [facility]  [employer] [facility]  Dept. 1) [address] [place] 2) [address] [place] 3)  [address], [place] [phone]  [phone]   [facility] Child Abuse Hotline [phone] or [phone] (After Hours)

## 2014-08-29 NOTE — ED Notes (Signed)
Per EMS, pt fell while attempting to get out of bed and not use her walker. Pt had a call bell attached to her gown and has she was getting up, the call bell went off to notify staff. Pt was returned back to bed by skilled nursing facility. Pt has endured a hematoma to the forehead, lip laceration, skin tears, and bruising.

## 2014-08-29 NOTE — ED Notes (Signed)
Bed: [id number] Expected date:  Expected time:  Means of arrival:  Comments: EMS 37F fall from SNF

## 2014-08-29 NOTE — ED Notes (Signed)
Removed back board and c-collar with permission from CrellinKaren, [place] and assistance from other staff members.

## 2014-08-29 NOTE — ED Notes (Signed)
Complains of lower back pain. While PA was assessing, pt reports tenderness upper, mid, and lower spinal tenderness.

## 2014-08-29 NOTE — ED Notes (Signed)
Assisted patient with fractured bedpan.

## 2014-08-29 NOTE — ED Notes (Signed)
Family [facility]  [phone] [facility].

## 2014-08-29 NOTE — ED Provider Notes (Signed)
CSN: [id number]     Arrival date & time 08/29/14  1478 History   First MD Initiated Contact with Patient 08/29/14 [phone]     Chief Complaint  Patient presents with  . Fall     (Consider location/radiation/quality/duration/timing/severity/associated sxs/prior Treatment) Patient is a 78 y.o. female presenting with fall. The history is provided by the patient. No language interpreter was used.  Fall This is a new problem. The current episode started today. The problem occurs constantly. The problem has been gradually worsening. Associated symptoms include headaches, myalgias and neck pain. She has tried nothing for the symptoms. The treatment provided no relief.   Pt fell out of bed and hit her head.   Pt complasins of multiple areas of pain.   Forehead, mouth, neck and back,  Mid back and low back.  0 Past Medical History  Diagnosis Date  . Hypertrophic cardiomyopathy     a. 11/2013 Echo: EF 55-60%, basal inf HK, sev dil LA, no evidence of HCM.  PASP .  [name] Hypertension   . Situational mixed anxiety and depressive disorder   . Hypercholesterolemia   . Falls frequently     coumadin stopped  . Pulmonary HTN     a. has had prior right heart cath in 2010; was felt that most likely due to elevated left sided pressures and would not benefit from vasodilator therapy;  b. 11/2013 Echo: PASP .  [name] Oxygen dependent     "3L 24/7" (08/05/2013)  . COPD (chronic obstructive pulmonary disease)   . TIA (transient ischemic attack)   . Atrial fibrillation     a. Chronic; No longer on coumadin 2/2 frequent falls.  . CHF (congestive heart failure)   . Pacemaker     a. 04/2012 MDT [id number] [employer] PPM, ser #: [id number] H.  . Varicose veins   . History of pneumonia     "couple times; long time ago" (08/05/2013)  . Type II diabetes mellitus   . Anemia     "as a child" (08/05/2013)  . GERD (gastroesophageal reflux disease)   . H/O hiatal hernia   . Osteoarthritis   . Arthritis     "all over me;  in all my joints" (08/05/2013)  . Coccyx pain   . Gout     "right foot" (08/05/2013)  . Anxiety   . Depression   . Chest pain     a. 08/2011 Myoview: sm, fixed anteroseptal defect (attenuation), no ischemia, EF 62%.   Past Surgical History  Procedure Laterality Date  . Total knee arthroplasty Right 2011  . Tonsillectomy    . Appendectomy    . Abdominal hysterectomy      "partial" (08/05/2013)  . Dilation and curettage of uterus      "several" (08/05/2013)  . Foot neuroma surgery Right   . Insert / replace / remove pacemaker  1998; 2000's; 2014  . Cardiac catheterization      "more than once" (08/05/2013)  . Back surgery      "bone spur removed; mid back" (08/05/2013)   Family History  Problem Relation Age of Onset  . Other Father     died in plane crash  . Cancer Mother     lymphoma-NHL  . Coronary artery disease Grandchild   . Heart disease Maternal Grandmother    History  Substance Use Topics  . Smoking status: Former Smoker -- 1.00 packs/day for 40 years    Types: Cigarettes    Quit date: 11/15/2005  . Smokeless  tobacco: Never Used  . Alcohol Use: No   OB History   Grav Para Term Preterm Abortions TAB SAB Ect Mult Living                 Review of Systems  Musculoskeletal: Positive for myalgias and neck pain.  Skin: Positive for wound.  Neurological: Positive for headaches.  All other systems reviewed and are negative.     Allergies  Aspirin; Calan; Crestor; Escitalopram oxalate; Lipitor; Lopressor; Nitroglycerin; Norvasc; Nsaids; Oxycodone; Prednisone; Sulfa antibiotics; Vimovo; and Penicillins  Home Medications   Prior to Admission medications   Medication Sig Start Date End Date Taking? Authorizing Provider  acetaminophen (TYLENOL) 500 MG tablet Take 500 mg by mouth every 6 (six) hours as needed for moderate pain.    Historical Provider, MD  albuterol (PROVENTIL HFA;VENTOLIN HFA) 108 (90 BASE) MCG/ACT inhaler Inhale 2 puffs into the lungs every 4 (four)  hours as needed (cough).    Historical Provider, MD  budesonide-formoterol (SYMBICORT) 160-4.5 MCG/ACT inhaler Inhale 2 puffs into the lungs 2 (two) times daily.    Historical Provider, MD  buPROPion (WELLBUTRIN XL) 150 MG 24 hr tablet Take 450 mg by mouth daily.     Historical Provider, MD  calcium carbonate (TUMS - DOSED IN MG ELEMENTAL CALCIUM) 500 MG chewable tablet Chew 1 tablet by mouth 4 (four) times daily as needed for indigestion. Only as needed    Historical Provider, MD  diltiazem (CARDIZEM CD) 240 MG 24 hr capsule Take 1 capsule (240 mg total) by mouth daily. 01/07/14   Cassell Clement, MD  famotidine (PEPCID) 20 MG tablet Take 20 mg by mouth daily as needed for indigestion.    Historical Provider, MD  fluticasone (FLONASE) 50 MCG/ACT nasal spray Place 1 spray into both nostrils daily as needed for allergies.     Historical Provider, MD  furosemide (LASIX) 20 MG tablet Take 20 mg by mouth daily as needed for fluid.    Historical Provider, MD  HYDROcodone-acetaminophen (NORCO/VICODIN) 5-325 MG per tablet Take 1 tablet by mouth every 4 (four) hours as needed for moderate pain.    Historical Provider, MD  hydrOXYzine (VISTARIL) 25 MG capsule Take 25 mg by mouth daily. At noon. And every 8 hours as needed for anxiety    Historical Provider, MD  losartan (COZAAR) 50 MG tablet TAKE 1 TABLET BY MOUTH EVERY DAY 07/25/14   Cassell Clement, MD  metFORMIN (GLUCOPHAGE) 500 MG tablet Take 500 mg by mouth 2 (two) times daily with a meal.    Historical Provider, MD  Multiple Vitamins-Iron (MULTIVITAMIN/IRON PO) Take 1 tablet by mouth daily.    Historical Provider, MD  phenazopyridine (PYRIDIUM) 95 MG tablet Take 95 mg by mouth as needed for pain (for bladder pain.). As directed.    Historical Provider, MD  potassium chloride (KLOR-CON) 20 MEQ packet Take 20 mEq by mouth daily as needed (take with lasix).     Historical Provider, MD  sertraline (ZOLOFT) 100 MG tablet Take 200 mg by mouth as needed (for  depression and anxiety).     Historical Provider, MD   BP 190/83  Pulse 98  Temp(Src) 98.2 F (36.8 C) (Oral)  Resp 20  SpO2 96% Physical Exam  Nursing note and vitals reviewed. Constitutional: She is oriented to person, place, and time. She appears well-developed and well-nourished.  HENT:  Head: Normocephalic.  Right Ear: External ear normal.  Left Ear: External ear normal.  Mouth/Throat: Oropharynx is clear and moist.  Eyes:  Conjunctivae and EOM are normal. Pupils are equal, round, and reactive to light.  Neck:  Pain with palpation  Cardiovascular: Normal rate and normal heart sounds.   Pulmonary/Chest: Effort normal.  Abdominal: Soft. She exhibits no distension.  Musculoskeletal: Normal range of motion. She exhibits tenderness.  From all extremities  Skin tears superficial left arm  Neurological: She is alert and oriented to person, place, and time.  Skin: Skin is warm.  Laceration lip 1 cm, forehead 1.5 cmand skin tears 7x1 cm  Right arm   Psychiatric: She has a normal mood and affect.    ED Course  LACERATION REPAIR Date/Time: 08/29/2014 10:06 AM Performed by: Elson Areas Authorized by: Elson Areas Consent: Verbal consent obtained. Risks and benefits: risks, benefits and alternatives were discussed Consent given by: patient Patient identity confirmed: verbally with patient Body area: head/neck Location details: forehead Laceration length: 3 cm Foreign bodies: no foreign bodies Vascular damage: no Preparation: Patient was prepped and draped in the usual sterile fashion. Irrigation solution: saline Amount of cleaning: standard Skin closure: glue Patient tolerance: Patient tolerated the procedure well with no immediate complications. Comments: Glue to laceration to upper lip 1.2 cm upper lip 1.4 cm  1.2 cm forehead   (including critical care time) Labs Review Labs Reviewed - No data to display  Imaging Review Dg Thoracic Spine 2 View  08/29/2014    CLINICAL DATA:  Larey Seat this morning, personal history of hypertrophic cardiomyopathy, hypertension, pulmonary hypertension, COPD, atrial fibrillation, CHF, type 2 diabetes  EXAM: THORACIC SPINE - 2 VIEW  COMPARISON:  CT chest 11/17/2013  FINDINGS: Twelve pairs of ribs.  Bones appear demineralized.  Marked compression deformity of T8 vertebra, unchanged.  Mild superior endplate compression deformity of T3 vertebra, unchanged.  No acute fracture or bone destruction identified.  Visualized posterior ribs intact.  Pacemaker leads noted.  IMPRESSION: Old stable appearing compression fractures of T8 and T3 vertebrae.  No acute abnormalities.  Osseous demineralization.   Electronically Signed   By: Ulyses Southward M.D.   On: 08/29/2014 08:11   Dg Lumbar Spine Complete  08/29/2014   CLINICAL DATA:  78 year old female with history of fall this morning complaining of pain in multiple regions, including the low back.  EXAM: LUMBAR SPINE - COMPLETE 4+ VIEW  COMPARISON:  Lumbar spine CT scan 11/18/2013.  FINDINGS: Five views of the lumbar spine again demonstrated an old burst fracture of L4 which appears similar to the prior CT examination the 11/18/2013, with up to 60% loss of central vertebral body height. There is approximately 5 mm of retropulsion of posterior vertebral body fragments at L4 slightly narrowing the central spinal canal this level, better demonstrated on prior CT scan. No new acute displaced fractures or new compression type fractures are otherwise noted. Multilevel degenerative disc disease and facet arthropathy, most severe at L5-S1. Severe atherosclerosis. Pacemaker leads seen projecting over the lower thorax.  IMPRESSION: 1. No acute radiographic abnormality of the lumbar spine. 2. Old burst fracture of L4 appear similar to prior examinations, as above. 3. Multilevel degenerative disc disease and lumbar spondylosis, as above. 4. Atherosclerosis.   Electronically Signed   By: Trudie Reed M.D.   On:  08/29/2014 08:10   Dg Pelvis 1-2 Views  08/29/2014   CLINICAL DATA:  Larey Seat this morning, BILATERAL hip pain, personal history of hypertension, hypertrophic cardiomyopathy, pulmonary hypertension, COPD, atrial fibrillation, type 2 diabetes, CHF  EXAM: PELVIS - 1-2 VIEW  COMPARISON:  11/17/2013  FINDINGS: Scattered atherosclerotic  calcification.  Bones demineralized.  Symmetric hip and SI joints.  No acute fracture, dislocation or bone destruction.  IMPRESSION: No acute osseous abnormalities.   Electronically Signed   By: Ulyses SouthwardMark  Boles M.D.   On: 08/29/2014 08:08   Ct Head Wo Contrast  08/29/2014   CLINICAL DATA:  Initial encounter for fall while attempting to get out of bed. Hematoma to the forehead. Lip laceration. Skin tears and bruising.  EXAM: CT HEAD WITHOUT CONTRAST  CT MAXILLOFACIAL WITHOUT CONTRAST  CT CERVICAL SPINE WITHOUT CONTRAST  TECHNIQUE: Multidetector CT imaging of the head, cervical spine, and maxillofacial structures were performed using the standard protocol without intravenous contrast. Multiplanar CT image reconstructions of the cervical spine and maxillofacial structures were also generated.  COMPARISON:  CT head and cervical spine 06/21/2014.  FINDINGS: CT HEAD FINDINGS  A right paramedian frontal scalp hematoma is present. There is no underlying fracture. The paranasal sinuses and mastoid air cells are clear. The osseous skull is intact.  Moderate generalized atrophy and diffuse white matter disease is present bilaterally. The ventricles are proportionate to the degree of atrophy and stable. Insert pass fluid. A punctate calcification is noted in the right parietal lobe, unchanged.  CT MAXILLOFACIAL FINDINGS  The right paramedian frontal scalp hematoma is again noted. There is no underlying fracture. The facial bones are intact. The paranasal sinuses clear. The mandible is intact and located. Three tooth anchors are present within the mandible. The patient is edentulous. Soft tissue  swelling is noted in the upper lip with a laceration. There is no underlying fracture.  CT CERVICAL SPINE FINDINGS  The cervical spine is imaged and skull base through T2-3. Vertebral body heights are maintained. Slight anterolisthesis at C4-5 is stable. There straightening of the normal cervical lordosis. Moderate facet degenerative changes are present bilaterally, worse on the left. There is fusion across the facet joints on the right at C2-3. Vascular calcifications are present within the carotid arteries bilaterally. The thyroid is within normal limits. The lung apices are clear.  IMPRESSION: 1. Right paramedian scalp hematoma without an underlying fracture. 2. Stable moderate atrophy and diffuse white matter disease. 3. No acute intracranial abnormality. 4. Soft tissue swelling within the upper lip without an underlying fracture. 5. Stable moderate spondylosis of the cervical spine without fracture or traumatic subluxation. Facet disease is worse on the left.   Electronically Signed   By: Gennette Pachris  Mattern M.D.   On: 08/29/2014 08:11   Ct Cervical Spine Wo Contrast  08/29/2014   CLINICAL DATA:  Initial encounter for fall while attempting to get out of bed. Hematoma to the forehead. Lip laceration. Skin tears and bruising.  EXAM: CT HEAD WITHOUT CONTRAST  CT MAXILLOFACIAL WITHOUT CONTRAST  CT CERVICAL SPINE WITHOUT CONTRAST  TECHNIQUE: Multidetector CT imaging of the head, cervical spine, and maxillofacial structures were performed using the standard protocol without intravenous contrast. Multiplanar CT image reconstructions of the cervical spine and maxillofacial structures were also generated.  COMPARISON:  CT head and cervical spine 06/21/2014.  FINDINGS: CT HEAD FINDINGS  A right paramedian frontal scalp hematoma is present. There is no underlying fracture. The paranasal sinuses and mastoid air cells are clear. The osseous skull is intact.  Moderate generalized atrophy and diffuse white matter disease is  present bilaterally. The ventricles are proportionate to the degree of atrophy and stable. Insert pass fluid. A punctate calcification is noted in the right parietal lobe, unchanged.  CT MAXILLOFACIAL FINDINGS  The right paramedian frontal scalp hematoma is  again noted. There is no underlying fracture. The facial bones are intact. The paranasal sinuses clear. The mandible is intact and located. Three tooth anchors are present within the mandible. The patient is edentulous. Soft tissue swelling is noted in the upper lip with a laceration. There is no underlying fracture.  CT CERVICAL SPINE FINDINGS  The cervical spine is imaged and skull base through T2-3. Vertebral body heights are maintained. Slight anterolisthesis at C4-5 is stable. There straightening of the normal cervical lordosis. Moderate facet degenerative changes are present bilaterally, worse on the left. There is fusion across the facet joints on the right at C2-3. Vascular calcifications are present within the carotid arteries bilaterally. The thyroid is within normal limits. The lung apices are clear.  IMPRESSION: 1. Right paramedian scalp hematoma without an underlying fracture. 2. Stable moderate atrophy and diffuse white matter disease. 3. No acute intracranial abnormality. 4. Soft tissue swelling within the upper lip without an underlying fracture. 5. Stable moderate spondylosis of the cervical spine without fracture or traumatic subluxation. Facet disease is worse on the left.   Electronically Signed   By: Gennette Pachris  Mattern M.D.   On: 08/29/2014 08:11   Dg Knee Complete 4 Views Left  08/29/2014   CLINICAL DATA:  Initial encounter after fall from bed this morning. Generalized bilateral knee pain.  EXAM: LEFT KNEE - COMPLETE 4+ VIEW  COMPARISON:  12/21/2011  FINDINGS: No fracture. No subluxation or dislocation. No joint effusion. No worrisome lytic or sclerotic osseous abnormality. Hypertrophic spurring is seen in the lateral and patellofemoral  compartments. Stable appearance of meniscal mineralization.  IMPRESSION: Degenerative changes without acute bony findings.   Electronically Signed   By: Kennith CenterEric  Mansell M.D.   On: 08/29/2014 09:52   Dg Knee Complete 4 Views Right  08/29/2014   CLINICAL DATA:  Initial encounter, pt fell while attempting to get out of bed  EXAM: RIGHT KNEE - COMPLETE 4+ VIEW  COMPARISON:  None.  FINDINGS: Right knee total arthroplasty noted. No evidence of fracture or dislocation. No joint effusion.  IMPRESSION: No acute osseous abnormality.   Electronically Signed   By: Genevive BiStewart  Edmunds M.D.   On: 08/29/2014 10:26   Ct Maxillofacial Wo Cm  08/29/2014   CLINICAL DATA:  Initial encounter for fall while attempting to get out of bed. Hematoma to the forehead. Lip laceration. Skin tears and bruising.  EXAM: CT HEAD WITHOUT CONTRAST  CT MAXILLOFACIAL WITHOUT CONTRAST  CT CERVICAL SPINE WITHOUT CONTRAST  TECHNIQUE: Multidetector CT imaging of the head, cervical spine, and maxillofacial structures were performed using the standard protocol without intravenous contrast. Multiplanar CT image reconstructions of the cervical spine and maxillofacial structures were also generated.  COMPARISON:  CT head and cervical spine 06/21/2014.  FINDINGS: CT HEAD FINDINGS  A right paramedian frontal scalp hematoma is present. There is no underlying fracture. The paranasal sinuses and mastoid air cells are clear. The osseous skull is intact.  Moderate generalized atrophy and diffuse white matter disease is present bilaterally. The ventricles are proportionate to the degree of atrophy and stable. Insert pass fluid. A punctate calcification is noted in the right parietal lobe, unchanged.  CT MAXILLOFACIAL FINDINGS  The right paramedian frontal scalp hematoma is again noted. There is no underlying fracture. The facial bones are intact. The paranasal sinuses clear. The mandible is intact and located. Three tooth anchors are present within the mandible. The  patient is edentulous. Soft tissue swelling is noted in the upper lip with a laceration. There  is no underlying fracture.  CT CERVICAL SPINE FINDINGS  The cervical spine is imaged and skull base through T2-3. Vertebral body heights are maintained. Slight anterolisthesis at C4-5 is stable. There straightening of the normal cervical lordosis. Moderate facet degenerative changes are present bilaterally, worse on the left. There is fusion across the facet joints on the right at C2-3. Vascular calcifications are present within the carotid arteries bilaterally. The thyroid is within normal limits. The lung apices are clear.  IMPRESSION: 1. Right paramedian scalp hematoma without an underlying fracture. 2. Stable moderate atrophy and diffuse white matter disease. 3. No acute intracranial abnormality. 4. Soft tissue swelling within the upper lip without an underlying fracture. 5. Stable moderate spondylosis of the cervical spine without fracture or traumatic subluxation. Facet disease is worse on the left.   Electronically Signed   By: Gennette Pac M.D.   On: 08/29/2014 08:11     EKG Interpretation None      MDM  Pt able to ambulate to bathroom.   Pt given 2 hydrocodone here for pain.     Final diagnoses:  Face lacerations, initial encounter  Abrasion of right forearm, initial encounter  Contusion, knee, right, initial encounter  Contusion, knee, left, initial encounter  Midline low back pain without sciatica    Return if any problems AVS Dermabond should be removed after 1 week if it has not flaked off.    Lonia Skinner [place], PA-C 08/29/14 1037

## 2014-08-29 NOTE — ED Notes (Signed)
Patient transported to X-ray 

## 2014-08-29 NOTE — ED Notes (Signed)
Pt disoriented to place and time, oriented to having a fall today.  Pt states "I know I'm at Dr. Ortencia KickZeigler's office"

## 2014-08-29 NOTE — ED Notes (Signed)
PA at bedside.

## 2014-08-30 NOTE — ED Provider Notes (Signed)
Medical screening examination/treatment/procedure(s) were conducted as a shared visit with non-physician practitioner(s) and myself.  I personally evaluated the patient during the encounter.   EKG Interpretation None      Patient presents following a fall. Reports that she fell out of bed. Denies any syncope, chest pain, or shortness of breath. Multiple lacerations and superficial abrasions noted to the face. Patient also complaining of back and bilateral knee pain. Imaging obtained and reassuring. Lacerations cleaned and cared for at the bedside.  All described as mechanical.  Patient has set her baseline mental status.  She is ambulatory and able to tolerate by mouth. Will discharge home with pain medication.  After history, exam, and medical workup I feel the patient has been appropriately medically screened and is safe for discharge home. Pertinent diagnoses were discussed with the patient. Patient was given return precautions.   Shon Batonourtney F Priscille Shadduck, MD 08/30/14 [phone]

## 2014-09-23 ENCOUNTER — Emergency Department: Payer: Self-pay | Admitting: Emergency Medicine

## 2014-09-24 LAB — CBC
HCT: 39.8 % (ref 35.0–47.0)
HGB: 13.2 g/dL (ref 12.0–16.0)
MCH: 32.7 pg (ref 26.0–34.0)
MCHC: 33.1 g/dL (ref 32.0–36.0)
MCV: 99 fL (ref 80–100)
Platelet: 256 10*3/uL (ref 150–440)
RBC: 4.03 10*6/uL (ref 3.80–5.20)
RDW: 14.2 % (ref 11.5–14.5)
WBC: 10 10*3/uL (ref 3.6–11.0)

## 2014-09-24 LAB — COMPREHENSIVE METABOLIC PANEL
ALT: 16 U/L
Albumin: 3.4 g/dL (ref 3.4–5.0)
Alkaline Phosphatase: 95 U/L
Anion Gap: 11 (ref 7–16)
BUN: 24 mg/dL — ABNORMAL HIGH (ref 7–18)
Bilirubin,Total: 0.6 mg/dL (ref 0.2–1.0)
CALCIUM: 8.5 mg/dL (ref 8.5–10.1)
CREATININE: 0.88 mg/dL (ref 0.60–1.30)
Chloride: 101 mmol/L (ref 98–107)
Co2: 27 mmol/L (ref 21–32)
EGFR (Non-African Amer.): >60
Glucose: 125 mg/dL — ABNORMAL HIGH (ref 65–99)
OSMOLALITY: 283 (ref 275–301)
Potassium: 3.9 mmol/L (ref 3.5–5.1)
SGOT(AST): 24 U/L (ref 15–37)
Sodium: 139 mmol/L (ref 136–145)
Total Protein: 7.5 g/dL (ref 6.4–8.2)

## 2014-09-24 LAB — TROPONIN I: TROPONIN-I: 0.02 ng/mL

## 2014-09-25 ENCOUNTER — Emergency Department: Payer: Self-pay | Admitting: Emergency Medicine

## 2014-09-29 IMAGING — CT CT HEAD W/O CM
2 series · 16 of 30 positions shown, 18 images · non-contrast
Comparison: 10/27/2012

CLINICAL DATA: Fall, head injury, headache, neck pain

CT HEAD WITHOUT CONTRAST
TECHNIQUE: Contiguous axial images were obtained from the base of
the skull through the vertex without contrast.

[Series 2: head w/o · axial · non-contrast · 0.49mm/px · z∈[+84,+197]mm · 8 of 28 slices shown, 10 images]
[im 4/28  brain]
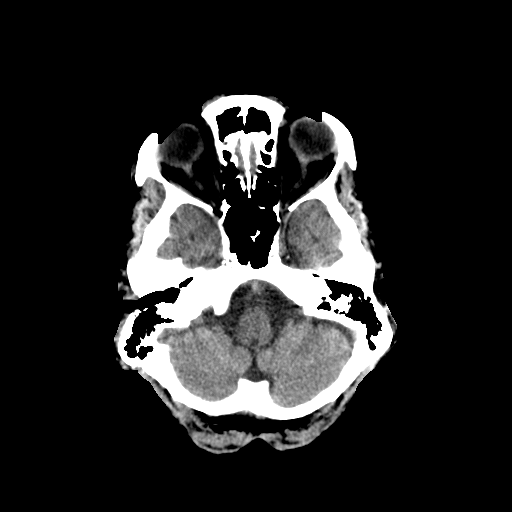
[im 4/28  bone]
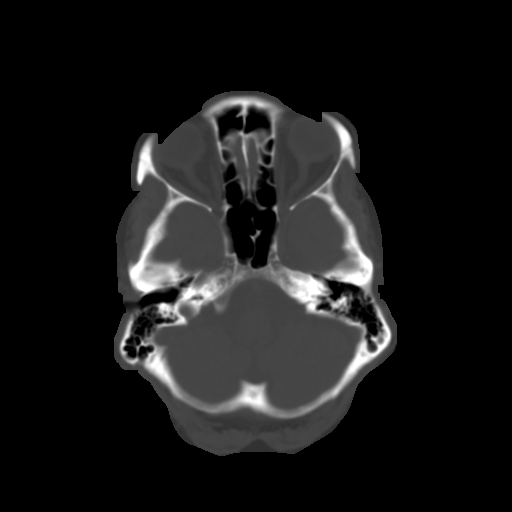
[im 7/28  brain]
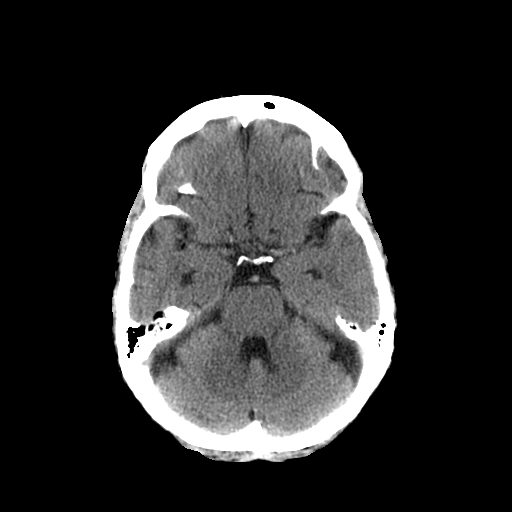
[im 10/28  brain]
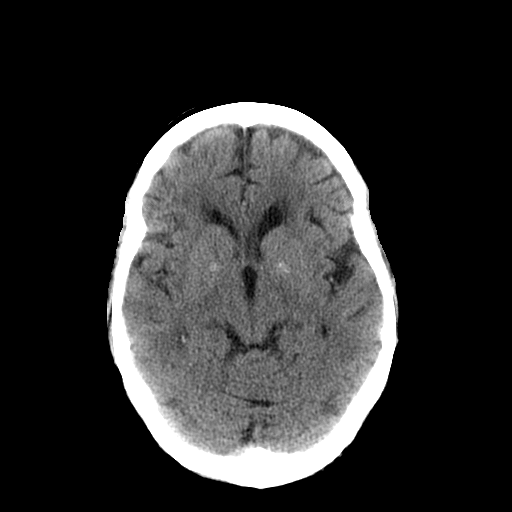
[im 13/28  brain]
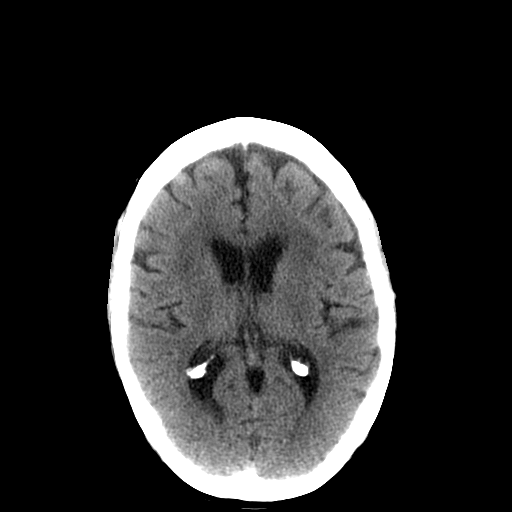
[im 16/28  brain]
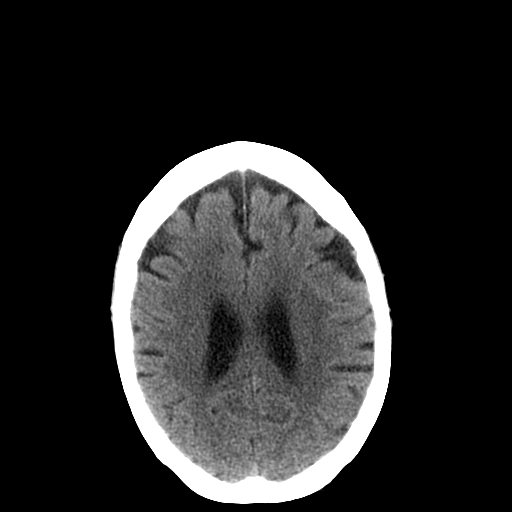
[im 16/28  bone]
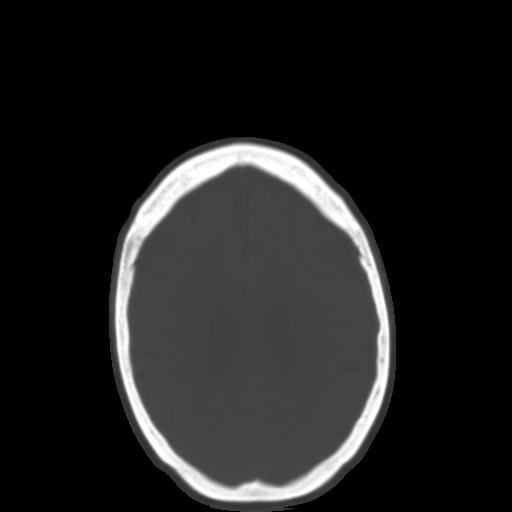
[im 19/28  brain]
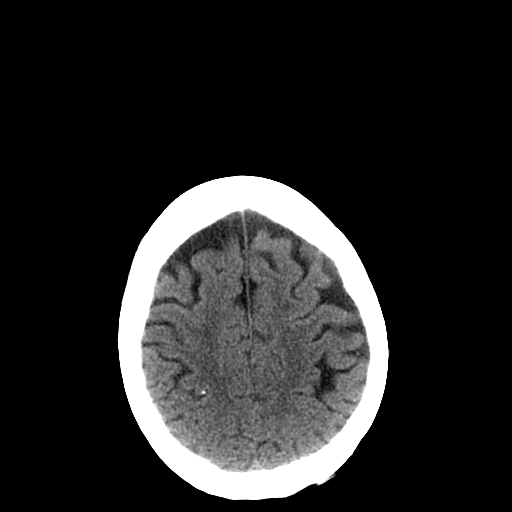
[im 22/28  brain]
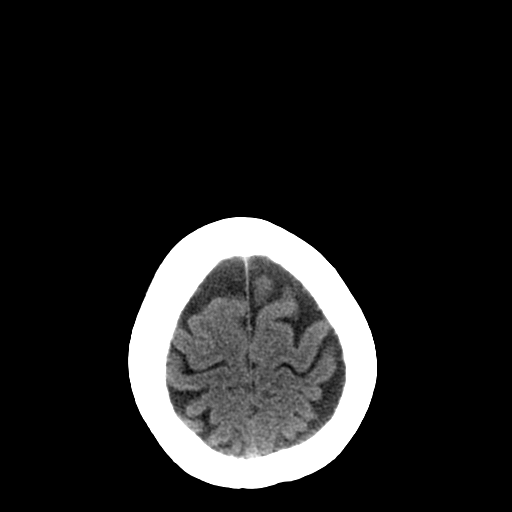
[im 25/28  brain]
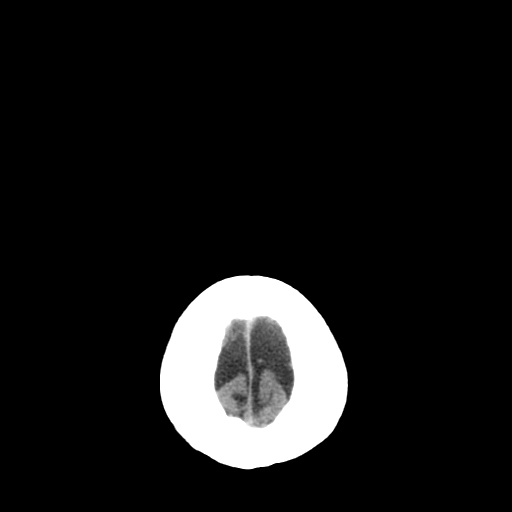

[Series 3: head bone · axial · 0.49mm/px · z∈[+80,+199]mm · 8 of 56 slices shown]
[im 6/56  bone]
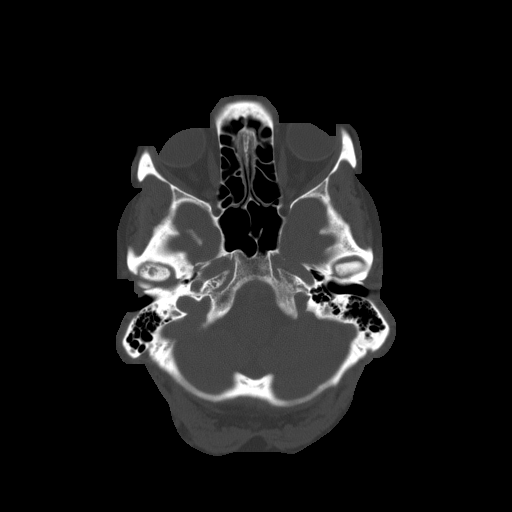
[im 12/56  bone]
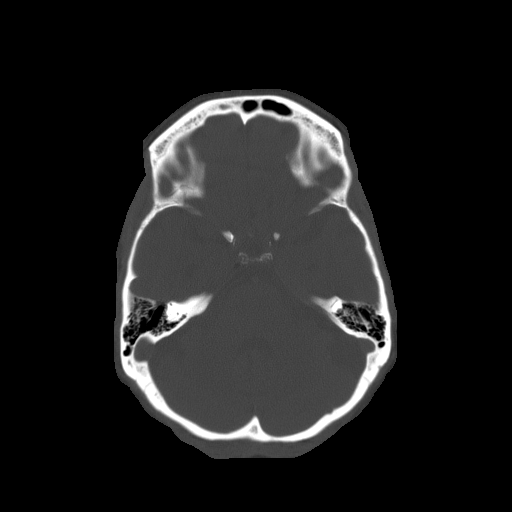
[im 18/56  bone]
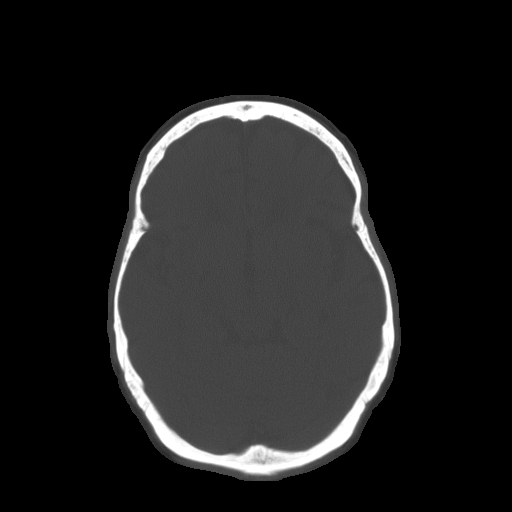
[im 24/56  bone]
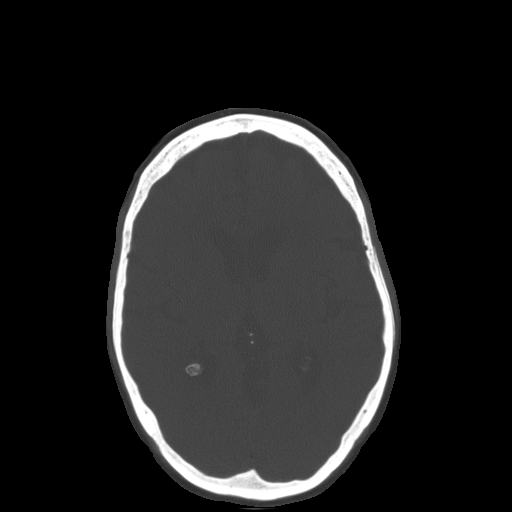
[im 32/56  bone]
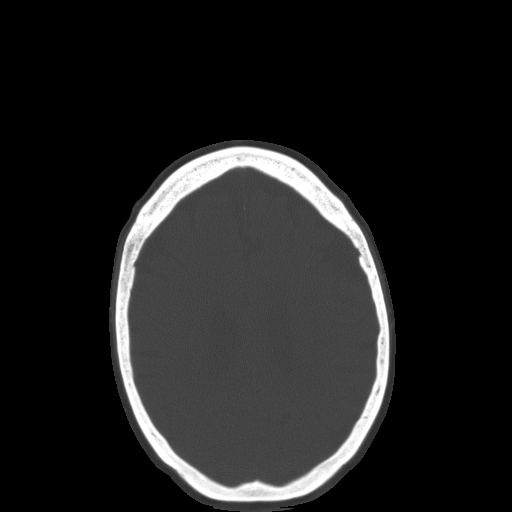
[im 38/56  bone]
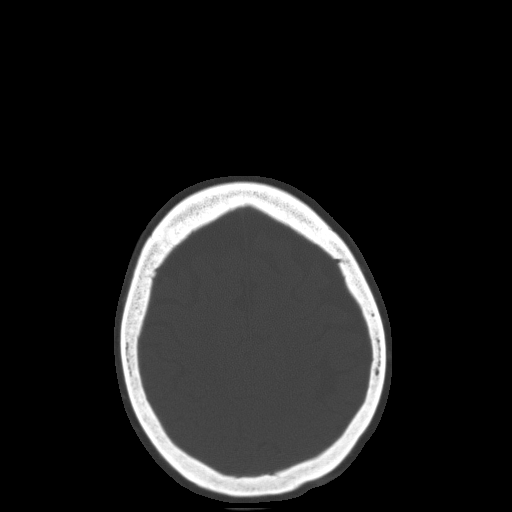
[im 44/56  bone]
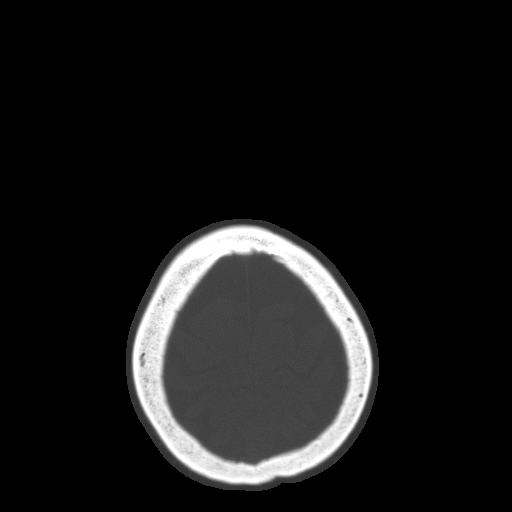
[im 50/56  bone]
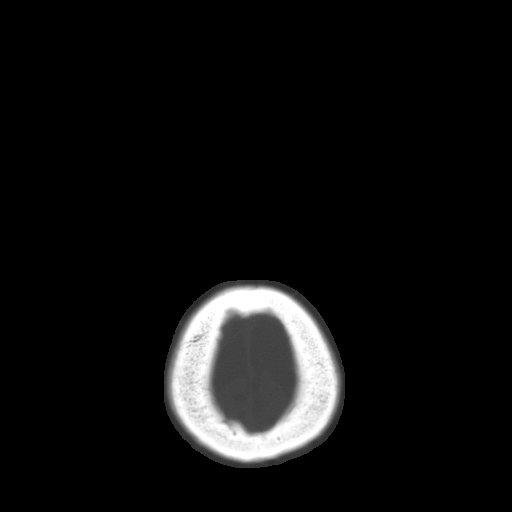

[16 of 30 positions shown; findings below may reference images not displayed]

FINDINGS: No evidence of parenchymal hemorrhage or extra-axial
fluid collection. No mass lesion, mass effect, or midline shift.

No CT evidence of acute infarction.

Subcortical white matter and periventricular small vessel ischemic
changes.  Intracranial atherosclerosis.

Mild global cortical atrophy.  No ventriculomegaly.

The visualized paranasal sinuses are essentially clear. The mastoid
air cells are unopacified.

No evidence of calvarial fracture.
IMPRESSION: No evidence of acute intracranial abnormality.

Atrophy with small vessel ischemic changes and intracranial
atherosclerosis.

## 2014-10-06 ENCOUNTER — Emergency Department: Payer: Self-pay | Admitting: Emergency Medicine

## 2014-10-07 LAB — URINALYSIS, COMPLETE
BILIRUBIN, UR: NEGATIVE
BLOOD: NEGATIVE
Glucose,UR: NEGATIVE mg/dL (ref 0–75)
Ketone: NEGATIVE
Leukocyte Esterase: NEGATIVE
NITRITE: POSITIVE
Ph: 5 (ref 4.5–8.0)
Protein: NEGATIVE
RBC,UR: 3 /HPF (ref 0–5)
SPECIFIC GRAVITY: 1.016 (ref 1.003–1.030)
Squamous Epithelial: <1
WBC UR: 5 /HPF (ref 0–5)

## 2014-10-08 LAB — URINE CULTURE

## 2014-10-13 ENCOUNTER — Telehealth: Payer: Self-pay | Admitting: *Deleted

## 2014-10-13 NOTE — Telephone Encounter (Signed)
Pt's daughter called. Pt now in memory care facility in [place]. Facility charges pt $24/mo to send remotes. Family prefers OV due to challenge to send remote outside of facility. Tentative appt made for 01/13/15 w/ device clinic in [place] . Family will call to see if appt can be changed to [facility]. Future recall w/ Dr. Graciela HusbandsKlein also changed to [facility] [place].

## 2014-10-20 ENCOUNTER — Encounter (HOSPITAL_COMMUNITY): Payer: Self-pay | Admitting: Internal Medicine

## 2014-10-24 ENCOUNTER — Ambulatory Visit: Payer: Medicare Other | Admitting: Cardiology

## 2014-10-29 ENCOUNTER — Emergency Department: Payer: Self-pay | Admitting: Student

## 2014-10-29 LAB — CBC WITH DIFFERENTIAL/PLATELET
Basophil #: 0 10*3/uL (ref 0.0–0.1)
Basophil %: 0.6 %
Eosinophil #: 0.5 10*3/uL (ref 0.0–0.7)
Eosinophil %: 7 %
HCT: 44.6 % (ref 35.0–47.0)
HGB: 14.6 g/dL (ref 12.0–16.0)
LYMPHS ABS: 1 10*3/uL (ref 1.0–3.6)
LYMPHS PCT: 15 %
MCH: 32.9 pg (ref 26.0–34.0)
MCHC: 32.8 g/dL (ref 32.0–36.0)
MCV: 100 fL (ref 80–100)
MONO ABS: 0.5 x10 3/mm (ref 0.2–0.9)
Monocyte %: 7.3 %
Neutrophil #: 4.5 10*3/uL (ref 1.4–6.5)
Neutrophil %: 70.1 %
Platelet: 245 10*3/uL (ref 150–440)
RBC: 4.45 10*6/uL (ref 3.80–5.20)
RDW: 13.6 % (ref 11.5–14.5)
WBC: 6.5 10*3/uL (ref 3.6–11.0)

## 2014-10-29 LAB — BASIC METABOLIC PANEL
ANION GAP: 6 — AB (ref 7–16)
BUN: 19 mg/dL — AB (ref 7–18)
CHLORIDE: 100 mmol/L (ref 98–107)
CO2: 28 mmol/L (ref 21–32)
Calcium, Total: 9.6 mg/dL (ref 8.5–10.1)
Creatinine: 0.96 mg/dL (ref 0.60–1.30)
EGFR (African American): >60
EGFR (Non-African Amer.): >60
GLUCOSE: 126 mg/dL — AB (ref 65–99)
Osmolality: 272 (ref 275–301)
Potassium: 4.3 mmol/L (ref 3.5–5.1)
SODIUM: 134 mmol/L — AB (ref 136–145)

## 2014-10-29 LAB — URINALYSIS, COMPLETE
BACTERIA: NONE SEEN
Bilirubin,UR: NEGATIVE
Blood: NEGATIVE
Glucose,UR: NEGATIVE mg/dL (ref 0–75)
Hyaline Cast: 3
Ketone: NEGATIVE
LEUKOCYTE ESTERASE: NEGATIVE
NITRITE: POSITIVE
PH: 6 (ref 4.5–8.0)
Protein: NEGATIVE
RBC,UR: 1 /HPF (ref 0–5)
Specific Gravity: 1.011 (ref 1.003–1.030)
Squamous Epithelial: NONE SEEN

## 2014-10-29 LAB — TROPONIN I: Troponin-I: <0.02 ng/mL

## 2014-10-29 LAB — PROTIME-INR
INR: 1.1
PROTHROMBIN TIME: 13.8 s (ref 11.5–14.7)

## 2014-10-29 LAB — HEPATIC FUNCTION PANEL A (ARMC)
ALBUMIN: 4.1 g/dL (ref 3.4–5.0)
ALT: 14 U/L
Alkaline Phosphatase: 81 U/L
Bilirubin, Direct: 0.1 mg/dL (ref 0.0–0.2)
Bilirubin,Total: 1 mg/dL (ref 0.2–1.0)
SGOT(AST): 22 U/L (ref 15–37)
TOTAL PROTEIN: 7.6 g/dL (ref 6.4–8.2)

## 2014-10-29 LAB — APTT: Activated PTT: 29.7 secs (ref 23.6–35.9)

## 2014-10-30 LAB — URINE CULTURE

## 2014-11-04 IMAGING — CR DG CHEST 1V PORT
1 series · 1 of 1 positions shown · non-contrast
Comparison: 11/22/2012; 09/28/2012

CLINICAL DATA: Respiratory distress

PORTABLE CHEST - 1 VIEW

[AP]
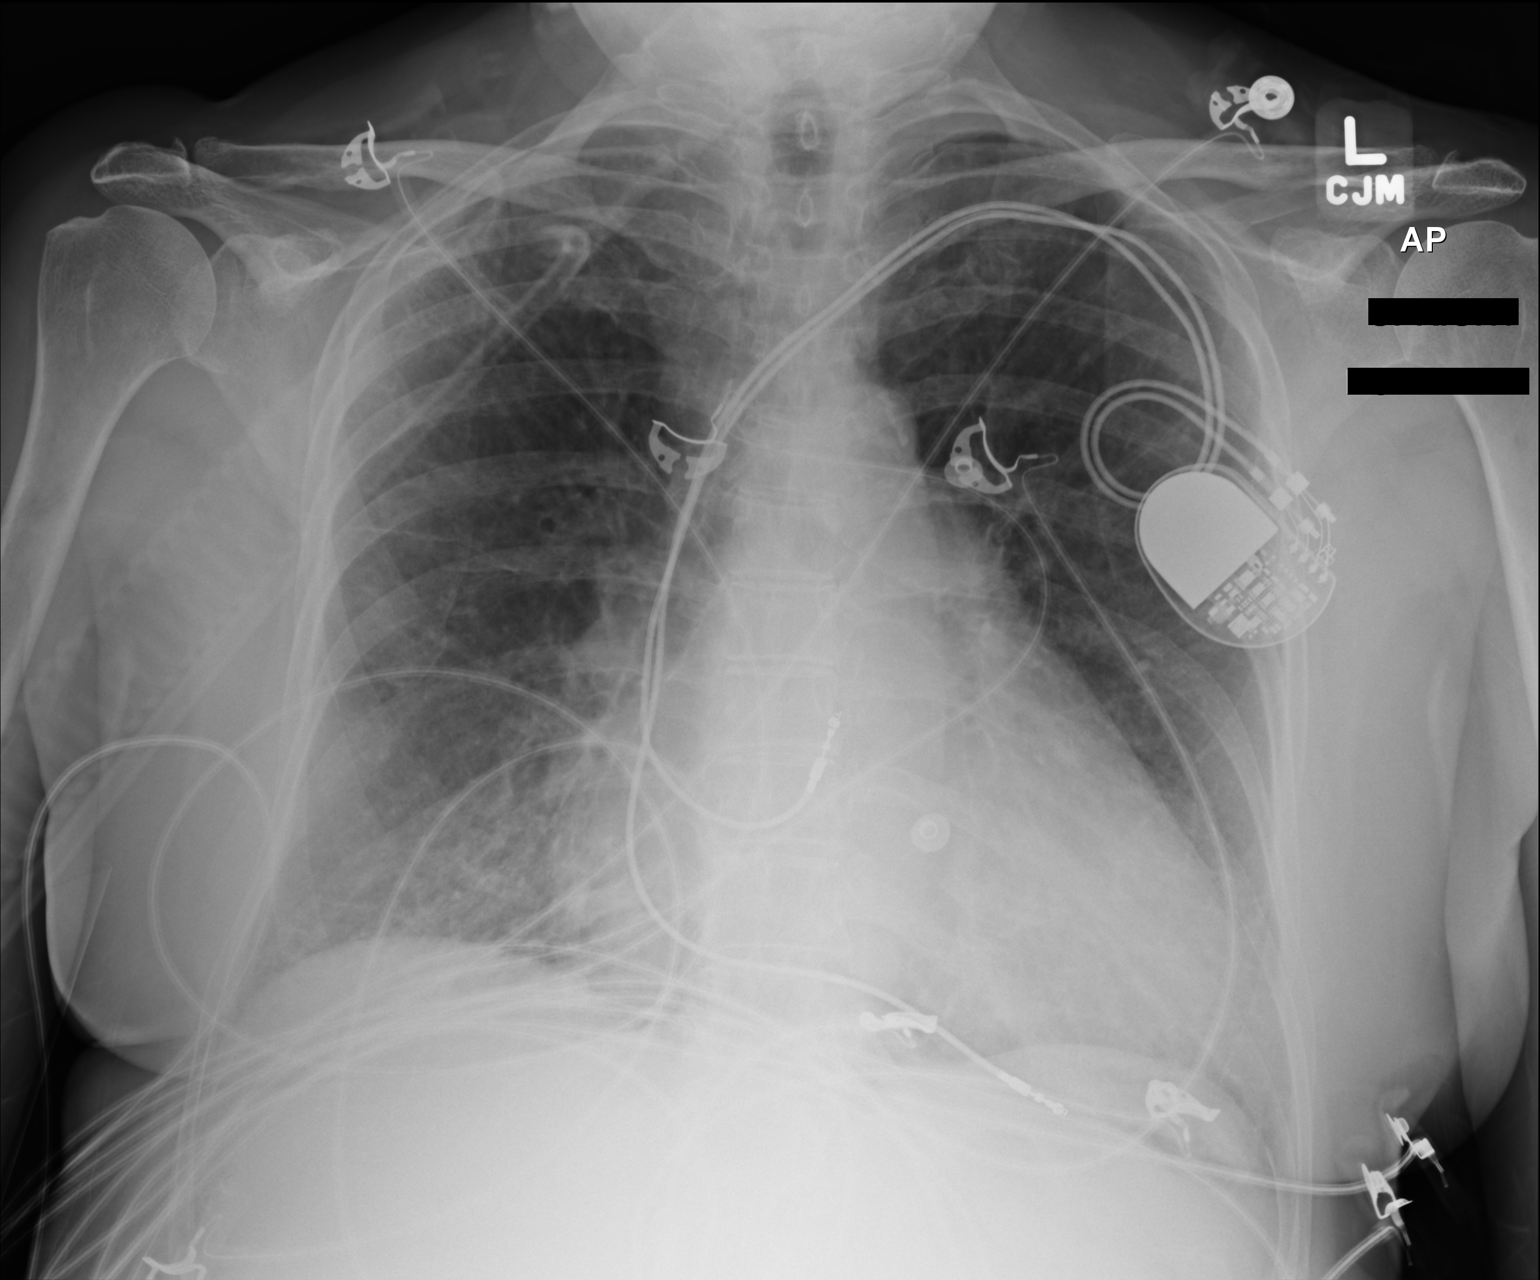

[1 of 1 positions shown; findings below may reference images not displayed]

FINDINGS: Grossly unchanged enlarged cardiac silhouette.
Atherosclerotic calcifications within the thoracic aorta.  Stable
positioning of support apparatus.  There is chronic findings of
pulmonary venous congestion.  Persistent thickening along the right
minor fissure.  Grossly unchanged bibasilar heterogeneous
opacities, favored to represent atelectasis.  No new focal airspace
opacities.  No pleural effusion or pneumothorax.  Unchanged bones.
IMPRESSION: Stable findings of cardiomegaly and chronic pulmonary venous
congestion without definite acute cardiopulmonary disease.

## 2014-11-11 ENCOUNTER — Emergency Department: Payer: Self-pay | Admitting: Emergency Medicine

## 2014-11-22 ENCOUNTER — Emergency Department: Payer: Self-pay | Admitting: Emergency Medicine

## 2014-11-28 IMAGING — CT CT CERVICAL SPINE W/O CM
3 of 5 series · 15 of 33 positions shown, 18 images · non-contrast
Comparison: CT scan dated s01/06/2012

CLINICAL DATA: Severe neck pain.  Abnormal gait.  Vertigo.

CT CERVICAL SPINE WITHOUT CONTRAST
TECHNIQUE: Multidetector CT imaging of the cervical spine was
performed. Multiplanar CT image reconstructions were also
generated.

[Series 200: angled axial · axial · 0.20mm/px · z∈[-27,+115]mm · 7 of 276 slices shown, 9 images]
[im 35/276  soft-tissue]
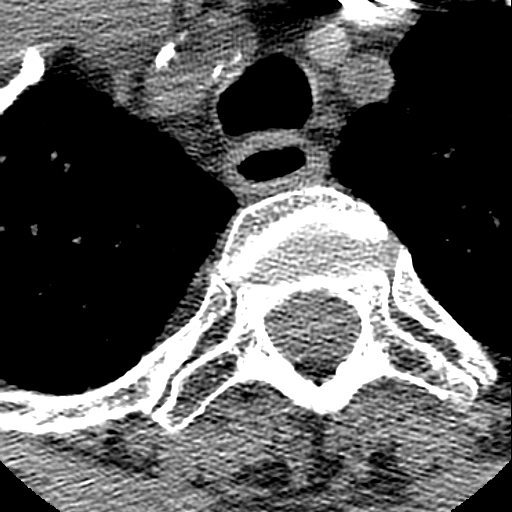
[im 35/276  bone]
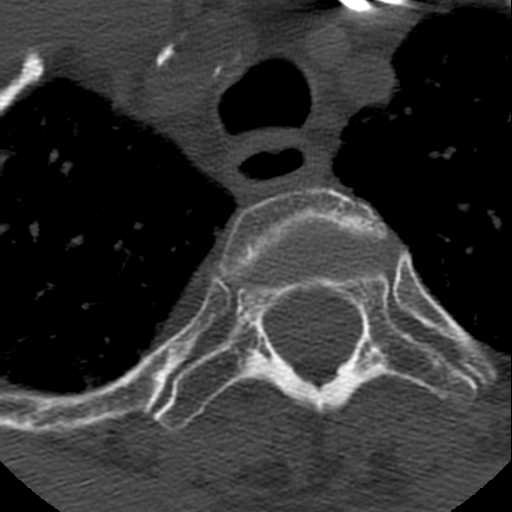
[im 69/276  bone]
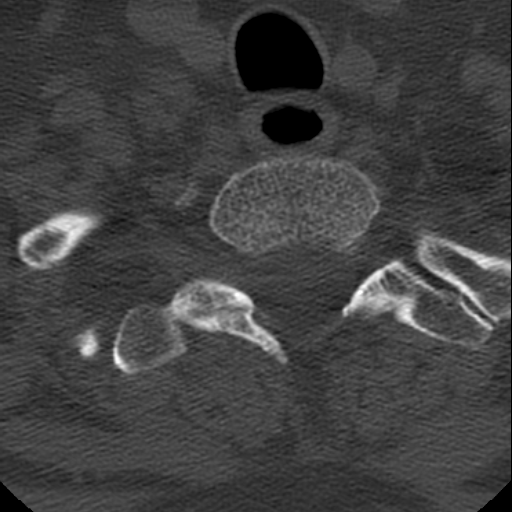
[im 104/276  bone]
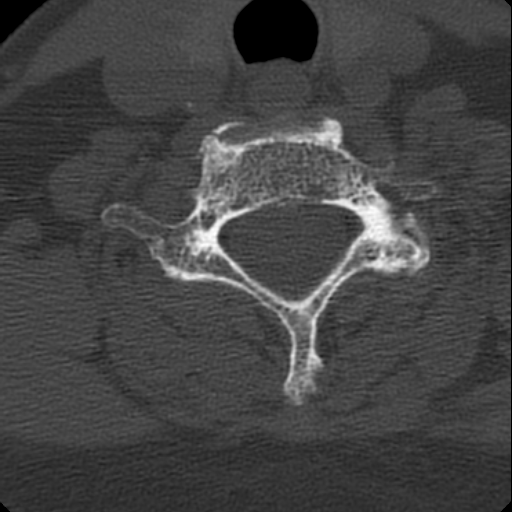
[im 138/276  bone]
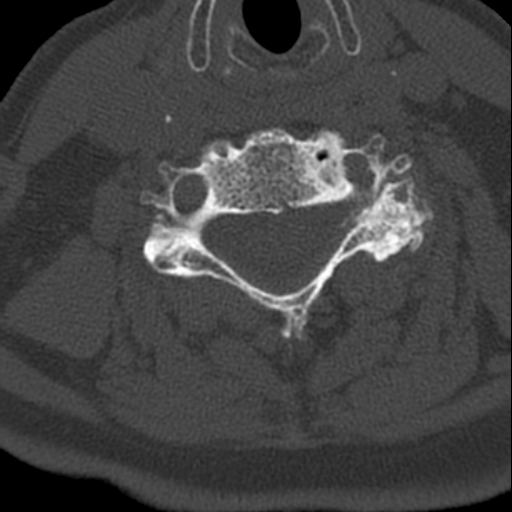
[im 172/276  soft-tissue]
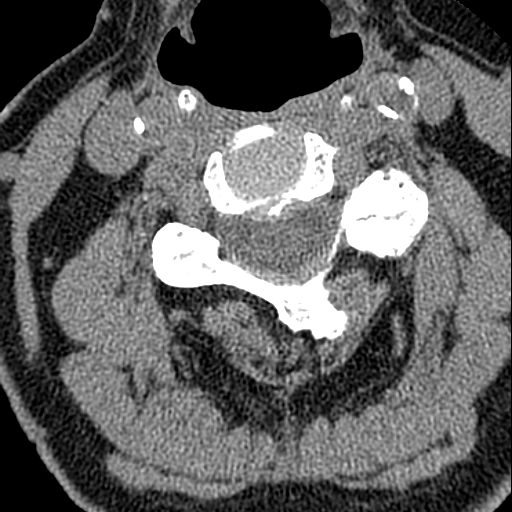
[im 172/276  bone]
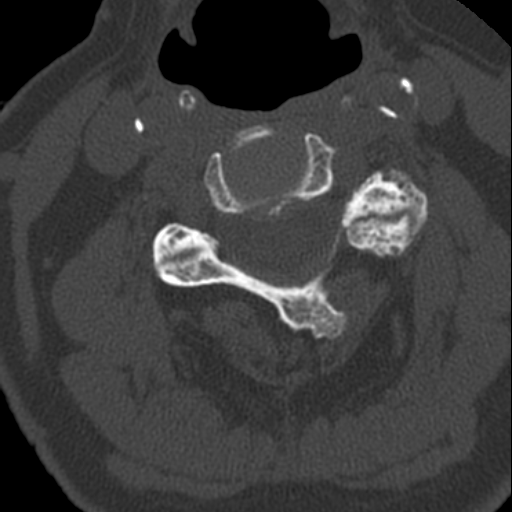
[im 207/276  bone]
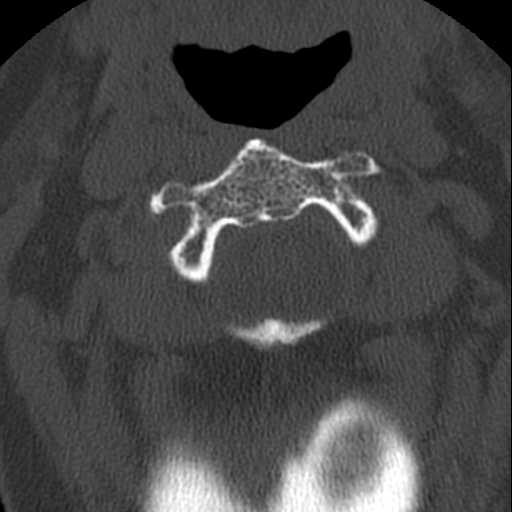
[im 241/276  bone]
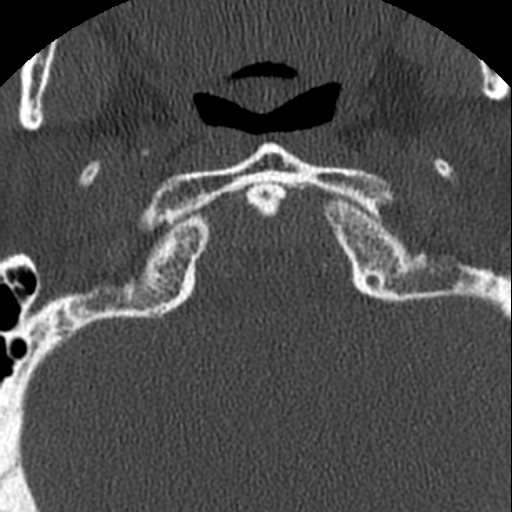

[Series 201: sag bone · sagittal · 0.39mm/px · 5 of 42 slices shown, 6 images]
[im 14/42  bone]
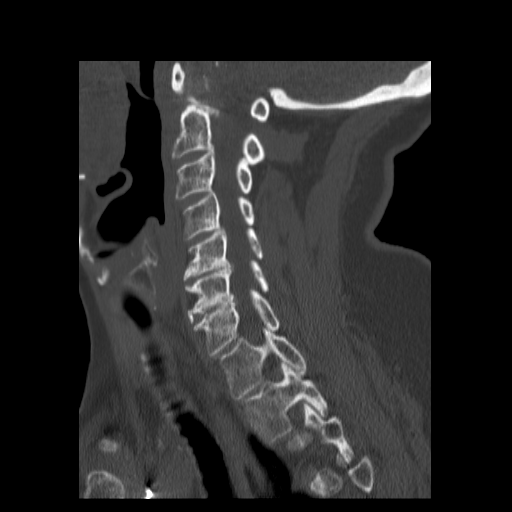
[im 18/42  bone]
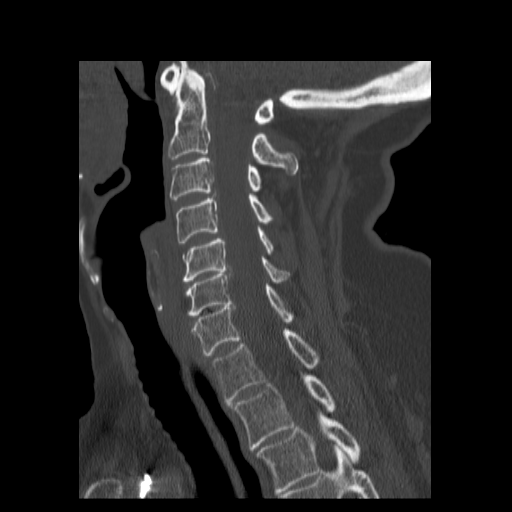
[im 21/42  soft-tissue]
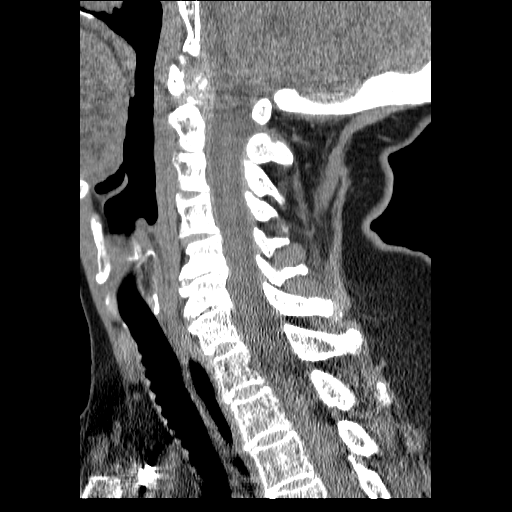
[im 21/42  bone]
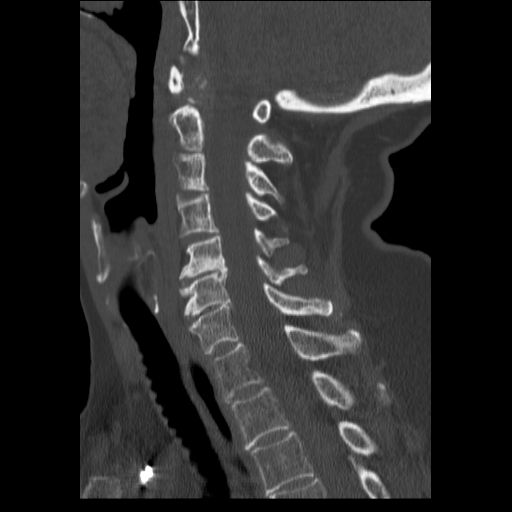
[im 24/42  bone]
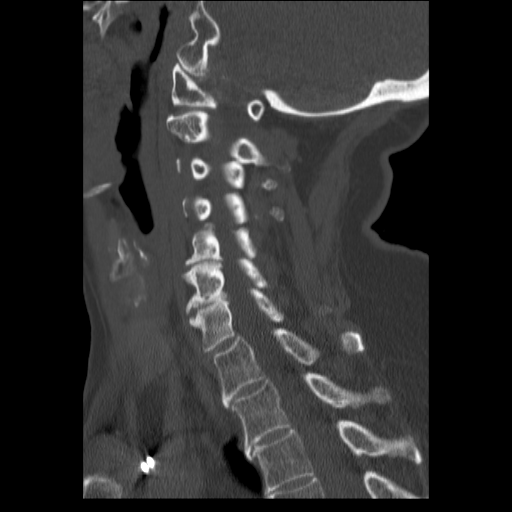
[im 28/42  bone]
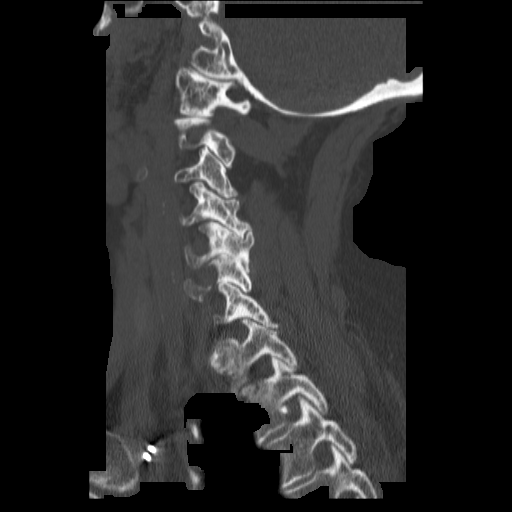

[Series 202: cor bone · coronal · 0.39mm/px · 3 of 42 slices shown]
[im 9/42  bone]
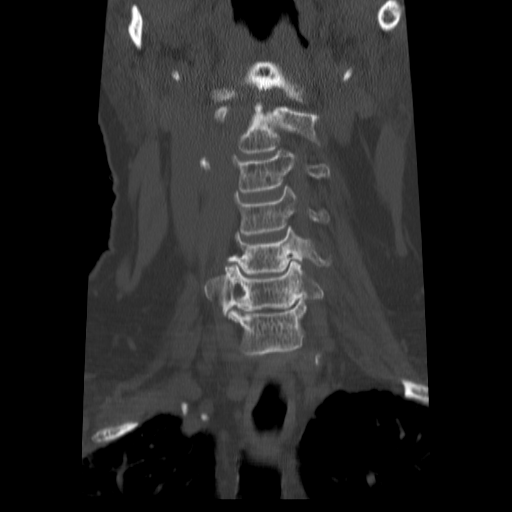
[im 17/42  bone]
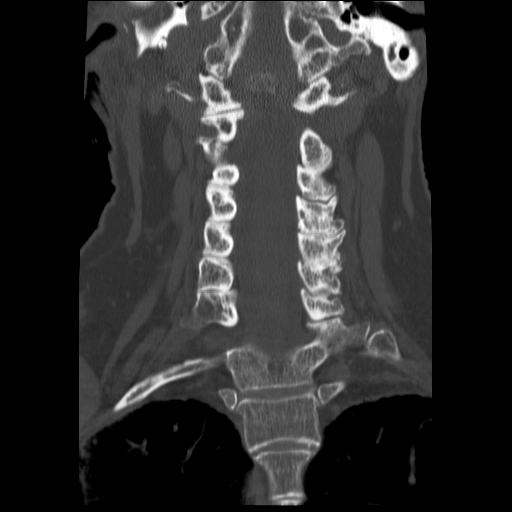
[im 25/42  bone]
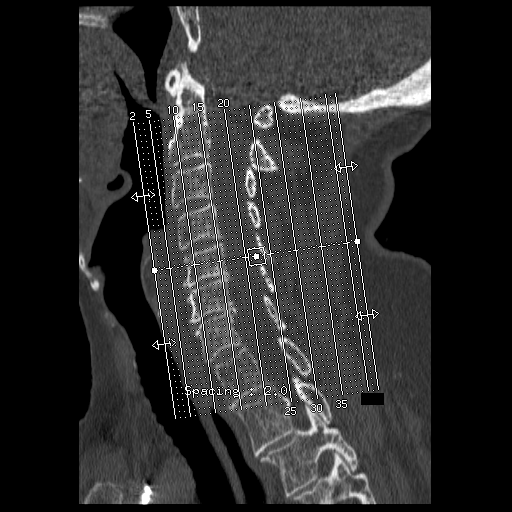

[15 of 33 positions shown; findings below may reference images not displayed]

FINDINGS: There are slight arthritic changes at the
occipitocervical articulations as well as at the right facet joint
of C1-2.

The right facet joint is fused at C2-3.

There is moderately severe left facet arthritis at C3-4 and C4-5
and C5-6 and to a lesser degree at C7-T1.

There are minimal arthritic changes of the facet joints on the
right in the cervical spine.

There is narrowing of the C5-6 and C6-7 disc spaces without disc
protrusions.  There is no foraminal stenosis.  Paraspinal soft
tissues are normal.  No visible mass or cervical spinal stenosis.

There is been no significant change in the appearance of the
cervical spine since 01/06/2012.
IMPRESSION: Multilevel degenerative disc and joint disease without focal neural
impingement.  No change since 01/06/2012.  Arthritis is most
prominent at C3-4 through C5-6 on the left.

## 2014-12-22 ENCOUNTER — Ambulatory Visit: Payer: Medicare Other | Admitting: Cardiology

## 2014-12-23 ENCOUNTER — Ambulatory Visit: Payer: Medicare Other | Admitting: Cardiology

## 2014-12-30 ENCOUNTER — Ambulatory Visit (INDEPENDENT_AMBULATORY_CARE_PROVIDER_SITE_OTHER): Payer: Medicare Other | Admitting: Cardiology

## 2014-12-30 ENCOUNTER — Encounter: Payer: Self-pay | Admitting: Cardiology

## 2014-12-30 VITALS — BP 136/62 | HR 80 | Ht 64.0 in | Wt 112.0 lb

## 2014-12-30 DIAGNOSIS — I482 Chronic atrial fibrillation, unspecified: Secondary | ICD-10-CM

## 2014-12-30 DIAGNOSIS — I119 Hypertensive heart disease without heart failure: Secondary | ICD-10-CM

## 2014-12-30 DIAGNOSIS — R296 Repeated falls: Secondary | ICD-10-CM

## 2014-12-30 NOTE — Progress Notes (Signed)
Cardiology Office Note   Date:  12/30/2014   ID:  [name] [name], DOB 1935-12-16, MRN [id number]  PCP:  No primary care provider on file.  Cardiologist:   Cassell Clement, MD   No chief complaint on file.     History of Present Illness: [name] [name] is a 79 y.o. female who presents for a six-month follow-up office visit  This pleasant 79 year old Caucasian female is seen for a followup office visit. The patient has a history of multiple falls.she is now a resident at [facility] assisted living memory care unit in [place]. She has a history of chronic atrial fibrillation. Because of her frequent falls she is not on anticoagulation.  She has a pacemaker in place. She has known LVH. She had an echocardiogram on 03/01/13 and showed severe pulmonary hypertension with PA pressure 82 mmHg. her most recent echocardiogram of 08/06/13 showed an ejection fraction of 60-65% and her pulmonary artery pressure was 71 mmHg Her left ventricular ejection fraction was 60-65% and there was mild LVH. Her most recent echocardiogram on 11/18/13 showed an ejection fraction of 55-60% and a pulmonary artery pressure of 60 and she has severe left atrial enlargement and moderate to severe right atrial enlargement. She has a past history of hypertrophic cardiomyopathy although this was not demonstrated on her most recent echo. In the hospital she was felt to be dehydrated and her Lasix was held. Patient is on Lasix 20 mg daily and is not having any evidence of fluid retention at this dose. In the past she has not done well if she gets dehydrated because of her past history of hypertrophic cardiomyopathy.  The patient had a Lexi scan Myoview on 12/14/13 which showed no ischemia and her ejection fraction was 52%.  Because of her frequent falls she has seen neurology at [facility]. She has seen Dr. Milon Dikes. Dr. Leanna Battles has diagnosed her with severe cervical stenosis which has caused numbness in her legs which is causing  her to fall.   The patient is hopeful of moving back home but this is unrealistic.  She would need round-the-clock care which would be unaffordable for her.  Past Medical History  Diagnosis Date  . Hypertrophic cardiomyopathy     a. 11/2013 Echo: EF 55-60%, basal inf HK, sev dil LA, no evidence of HCM.  PASP .  [name] Hypertension   . Situational mixed anxiety and depressive disorder   . Hypercholesterolemia   . Falls frequently     coumadin stopped  . Pulmonary HTN     a. has had prior right heart cath in 2010; was felt that most likely due to elevated left sided pressures and would not benefit from vasodilator therapy;  b. 11/2013 Echo: PASP .  [name] Oxygen dependent     "3L 24/7" (08/05/2013)  . COPD (chronic obstructive pulmonary disease)   . TIA (transient ischemic attack)   . Atrial fibrillation     a. Chronic; No longer on coumadin 2/2 frequent falls.  . CHF (congestive heart failure)   . Pacemaker     a. 04/2012 MDT [id number] [employer] PPM, ser #: [id number] H.  . Varicose veins   . History of pneumonia     "couple times; long time ago" (08/05/2013)  . Type II diabetes mellitus   . Anemia     "as a child" (08/05/2013)  . GERD (gastroesophageal reflux disease)   . H/O hiatal hernia   . Osteoarthritis   . Arthritis     "  all over me; in all my joints" (08/05/2013)  . Coccyx pain   . Gout     "right foot" (08/05/2013)  . Anxiety   . Depression   . Chest pain     a. 08/2011 Myoview: sm, fixed anteroseptal defect (attenuation), no ischemia, EF 62%.    Past Surgical History  Procedure Laterality Date  . Total knee arthroplasty Right 2011  . Tonsillectomy    . Appendectomy    . Abdominal hysterectomy      "partial" (08/05/2013)  . Dilation and curettage of uterus      "several" (08/05/2013)  . Foot neuroma surgery Right   . Insert / replace / remove pacemaker  1998; 2000's; 2014  . Cardiac catheterization      "more than once" (08/05/2013)  . Back surgery      "bone spur  removed; mid back" (08/05/2013)  . Pacemaker generator change N/A 04/24/2012    Procedure: PACEMAKER GENERATOR CHANGE;  Surgeon: Duke SalviaSteven C Klein, MD;  Location[facility];  Service: Cardiovascular;  Laterality: N/A;     Current Outpatient Prescriptions  Medication Sig Dispense Refill  . acetaminophen (TYLENOL) 500 MG tablet Take 500 mg by mouth every 6 (six) hours as needed for moderate pain.    [name]. albuterol (PROVENTIL HFA;VENTOLIN HFA) 108 (90 BASE) MCG/ACT inhaler Inhale 2 puffs into the lungs every 4 (four) hours as needed (cough).    . budesonide-formoterol (SYMBICORT) 160-4.5 MCG/ACT inhaler Inhale 2 puffs into the lungs 2 (two) times daily.    [name]. buPROPion (WELLBUTRIN XL) 150 MG 24 hr tablet Take 450 mg by mouth daily.     . busPIRone (BUSPAR) 7.5 MG tablet Take 7.5 mg by mouth 2 (two) times daily.    . calcium carbonate (TUMS - DOSED IN MG ELEMENTAL CALCIUM) 500 MG chewable tablet Chew 1 tablet by mouth 4 (four) times daily as needed for indigestion. Only as needed    . diltiazem (CARDIZEM CD) 240 MG 24 hr capsule Take 1 capsule (240 mg total) by mouth daily. 90 capsule 3  . docusate sodium (COLACE) 100 MG capsule Take 100 mg by mouth 2 (two) times daily.    . famotidine (PEPCID) 20 MG tablet Take 20 mg by mouth daily as needed for indigestion.    . fluticasone (FLONASE) 50 MCG/ACT nasal spray Place 1 spray into both nostrils daily as needed for allergies.     . furosemide (LASIX) 20 MG tablet Take 20 mg by mouth daily as needed for fluid.    [name]. HYDROcodone-acetaminophen (NORCO/VICODIN) 5-325 MG per tablet Take 2 tablets by mouth every 4 (four) hours as needed. 16 tablet 0  . hydrOXYzine (VISTARIL) 25 MG capsule Take 25 mg by mouth daily. At noon. And every 8 hours as needed for anxiety    . losartan (COZAAR) 50 MG tablet TAKE 1 TABLET BY MOUTH EVERY DAY 30 tablet 1  . metFORMIN (GLUCOPHAGE) 500 MG tablet Take 500 mg by mouth 2 (two) times daily with a meal.    . Multiple Vitamins-Iron  (MULTIVITAMIN/IRON PO) Take 1 tablet by mouth daily.    [name]. oxybutynin (DITROPAN XL) 15 MG 24 hr tablet Take 15 mg by mouth at bedtime.    . phenazopyridine (PYRIDIUM) 95 MG tablet Take 95 mg by mouth as needed for pain (for bladder pain.). As directed.    . potassium chloride (KLOR-CON) 20 MEQ packet Take 20 mEq by mouth daily as needed (take with lasix).     [name]. sertraline (  ZOLOFT) 100 MG tablet Take 200 mg by mouth as needed (for depression and anxiety).      No current facility-administered medications for this visit.    Allergies:   Aspirin; Calan; Crestor; Escitalopram oxalate; Lipitor; Lopressor; Nitroglycerin; Norvasc; Nsaids; Oxycodone; Prednisone; Sulfa antibiotics; Vimovo; and Penicillins    Social History:  The patient  reports that she quit smoking about 9 years ago. Her smoking use included Cigarettes. She has a 40 pack-year smoking history. She has never used smokeless tobacco. She reports that she does not drink alcohol or use illicit drugs.   Family History:  The patient's family history includes Cancer in her mother; Coronary artery disease in her grandchild; Heart disease in her maternal grandmother; Other in her father.    ROS:  Please see the history of present illness.   Otherwise, review of systems are positive for none.   All other systems are reviewed and negative.    PHYSICAL EXAM: VS:  BP 136/62 mmHg  Pulse 80  Ht  (1.626 m)  Wt 112 lb (50.803 kg)  BMI 19.22 kg/m2 , BMI Body mass index is 19.22 kg/(m^2). GEN: Well nourished, well developed, in no acute distress HEENT: normal Neck: no JVD, carotid bruits, or masses Cardiac: Irregularly irregular rhythm.;  Grade 2/6 systolic ejection murmur at the base.  rubs, or gallops,no edema  Respiratory:  clear to auscultation bilaterally, normal work of breathing GI: soft, nontender, nondistended, + BS MS: no deformity or atrophy Skin: warm and dry, no rash Neuro:  Strength and sensation are intact.  Not tested.  She  arrives by wheelchair. Psych: euthymic mood, full affect   EKG:  EKG is not ordered today.    Recent Labs: 05/29/2014: ALT 10; Hemoglobin 13.5; Platelets 248; TSH 3.510 05/31/2014: BUN 15; Creatinine 0.75; Magnesium 1.7; Potassium 3.8; Sodium 141    Lipid Panel    Component Value Date/Time   CHOL 154 08/05/2013 2018   TRIG 122 08/05/2013 2018   HDL 52 08/05/2013 2018   CHOLHDL 3.0 08/05/2013 2018   VLDL 24 08/05/2013 2018   LDLCALC 78 08/05/2013 2018      Wt Readings from Last 3 Encounters:  12/30/14 112 lb (50.803 kg)  07/12/14 120 lb 9.6 oz (54.704 kg)  06/22/14 117 lb (53.071 kg)         ASSESSMENT AND PLAN:  1. chronic atrial fibrillation 2. tachybradycardia syndrome with pacemaker 3. pulmonary hypertension 4. frequent falls secondary to cervical spondylosis and cervical stenosis, followed by neurology at [facility] 5. essential hypertension 6. diabetes mellitus 7. oxygen-dependent COPD 8. Anxiety. 9.  Weight loss, nonintentional  Plan: Continue current medication.  Encouraged her to increase oral fluid intake to reverse weight loss.  She admits to a poor appetite.    Current medicines are reviewed at length with the patient today.  The patient does not have concerns regarding medicines.  The following changes have been made:  no change   No orders of the defined types were placed in this encounter.     Disposition:   FU with Dr. Patty Sermons in 6 months for office visit and EKG   Signed, Cassell Clement, MD  12/30/2014 6:12 PM    [facility] [address] [place], [place], [place]  [address] Phone: [phone]; Fax: [phone]

## 2014-12-30 NOTE — Patient Instructions (Signed)
Your physician recommends that you continue on your current medications as directed. Please refer to the Current Medication list given to you today.  Your physician wants you to follow-up in: 6 month ov/ekg You will receive a reminder letter in the mail two months in advance. If you don't receive a letter, please call our office to schedule the follow-up appointment.  

## 2015-01-16 ENCOUNTER — Encounter: Payer: Self-pay | Admitting: Internal Medicine

## 2015-02-02 IMAGING — CR DG THORACIC SPINE 2V
3 series · 3 of 3 positions shown · non-contrast
Comparison: Lateral view of the chest 05/27/2013

CLINICAL DATA: Fall, neck pain, back pain

EXAM:
THORACIC SPINE - 2 VIEW

[t thoracic spine ap]
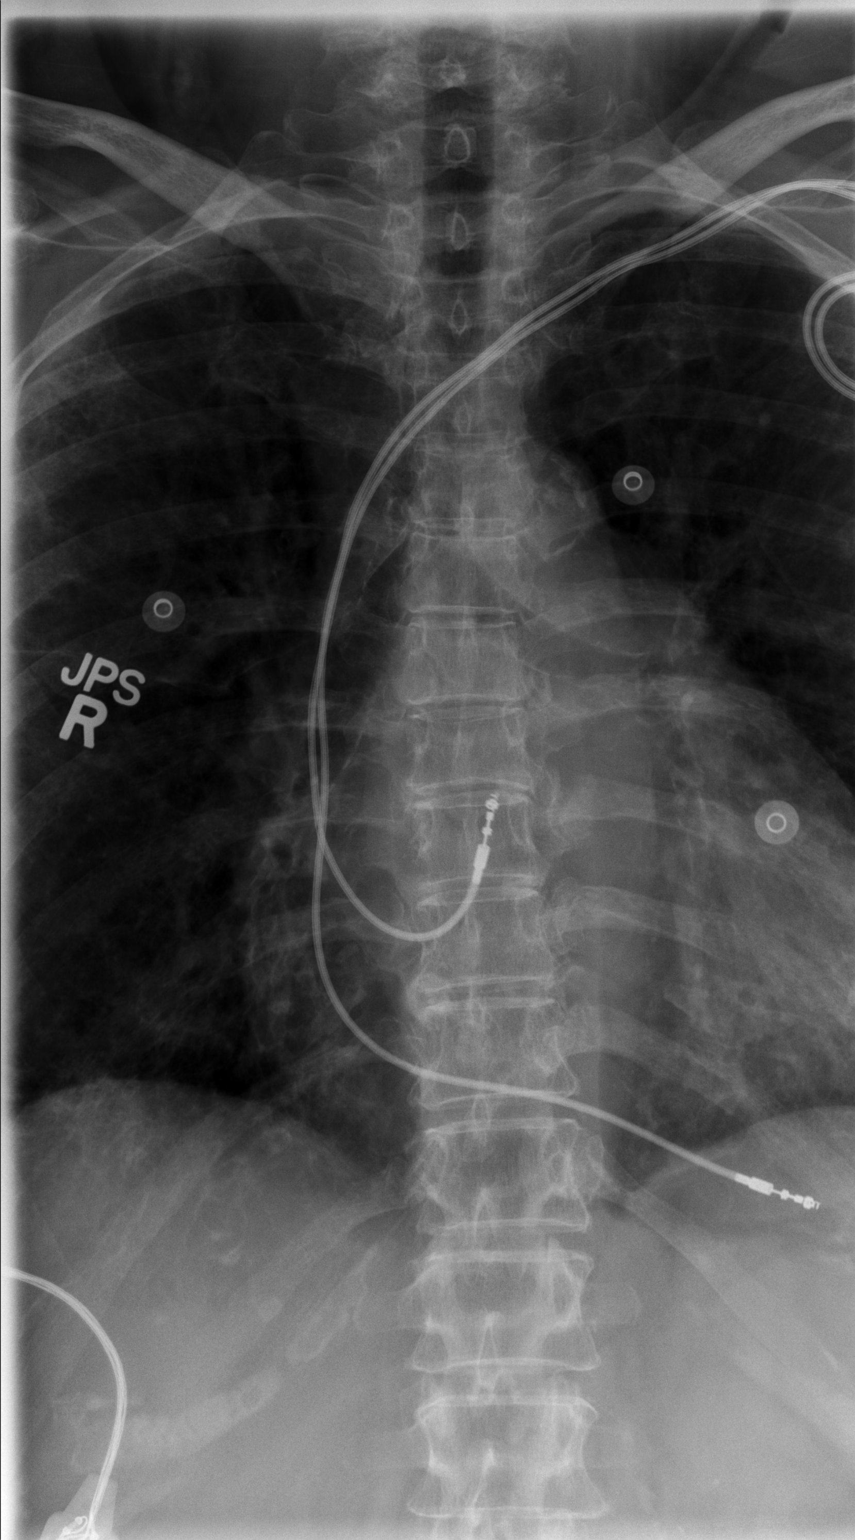

[t thoracic spine lat]
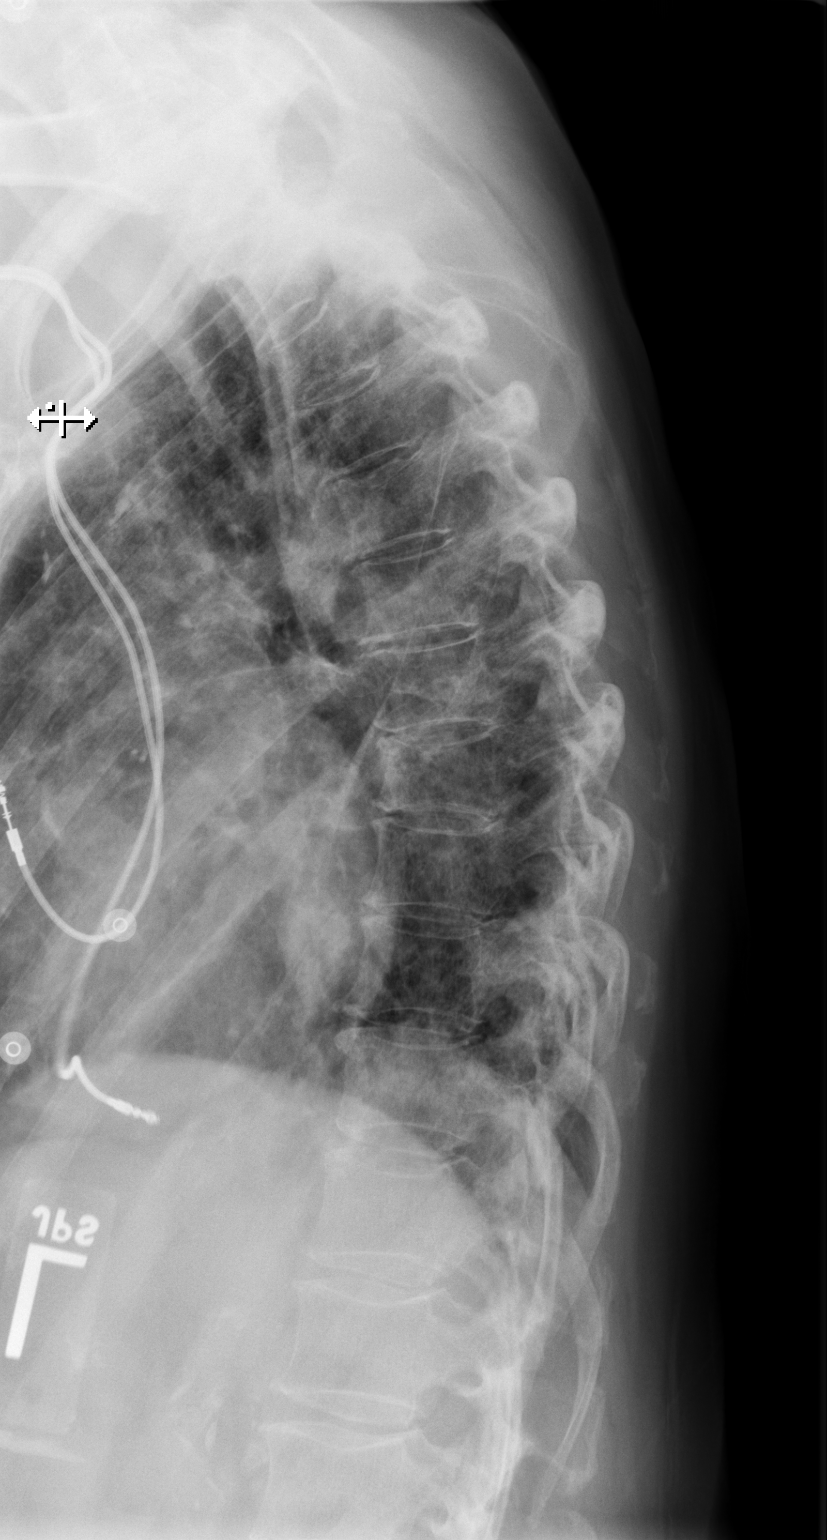

[t thoracic swimmers]
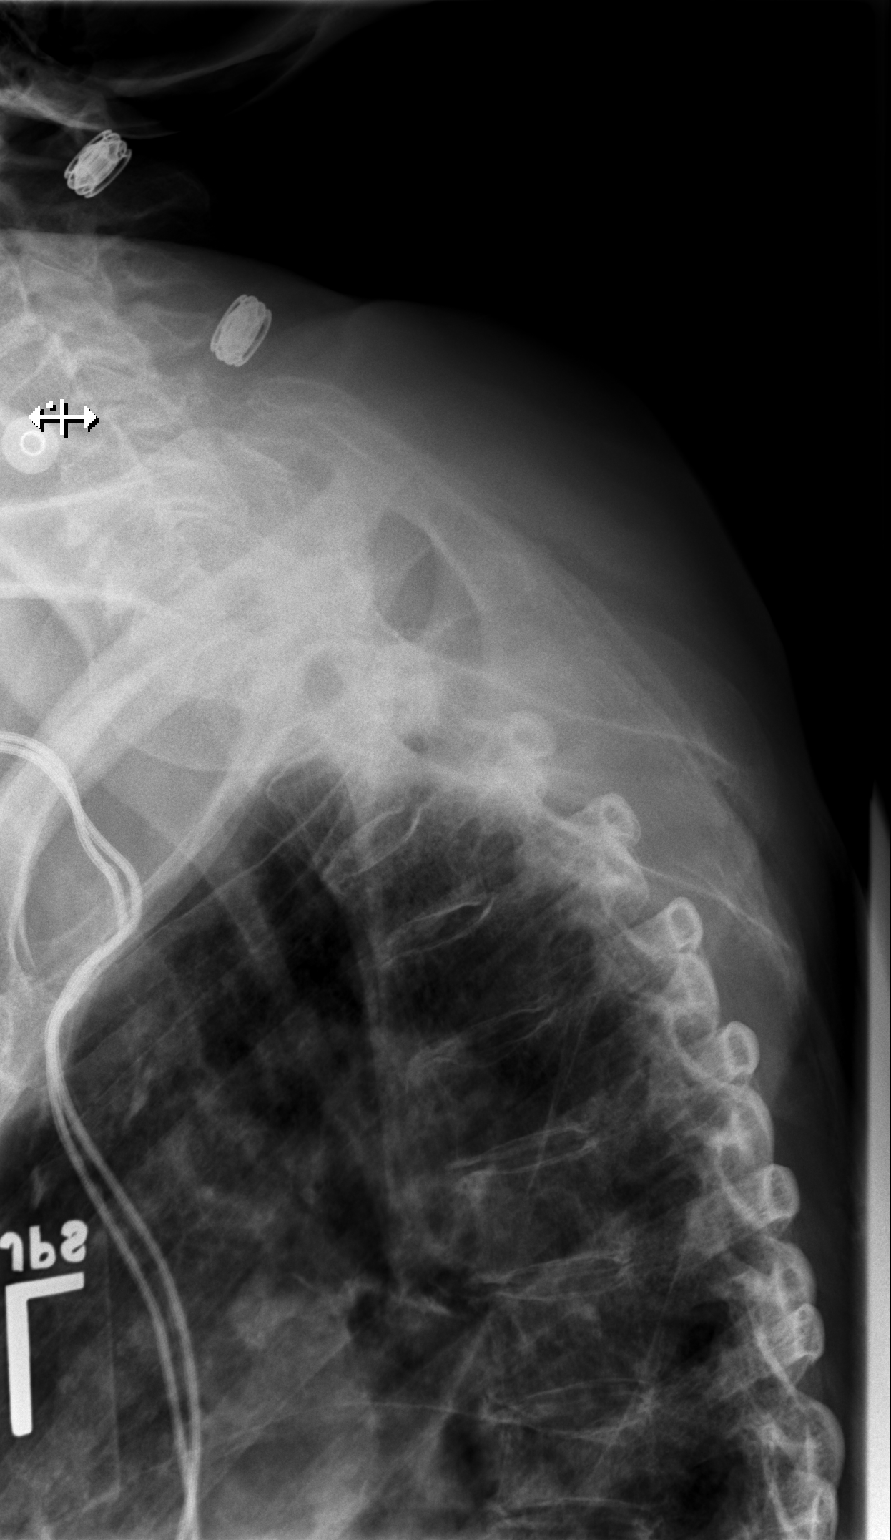

[3 of 3 positions shown; findings below may reference images not displayed]

FINDINGS: Three views of thoracic spine submitted. Alignment is preserved.
There is mild compression deformity probable T8 vertebral body. This
is new from prior exam. Clinical correlation is necessary. Acute
fracture cannot be excluded.
IMPRESSION: There is mild compression fracture superior endplate of T8 vertebral
body new from prior exam. Acute fracture cannot be excluded.
Clinical correlation is necessary.

## 2015-02-02 IMAGING — CR DG CHEST 1V
1 series · 1 of 1 positions shown · non-contrast
Comparison: 05/27/2013

CLINICAL DATA: Fall, neck pain

EXAM:
CHEST - 1 VIEW

[t chest supine]
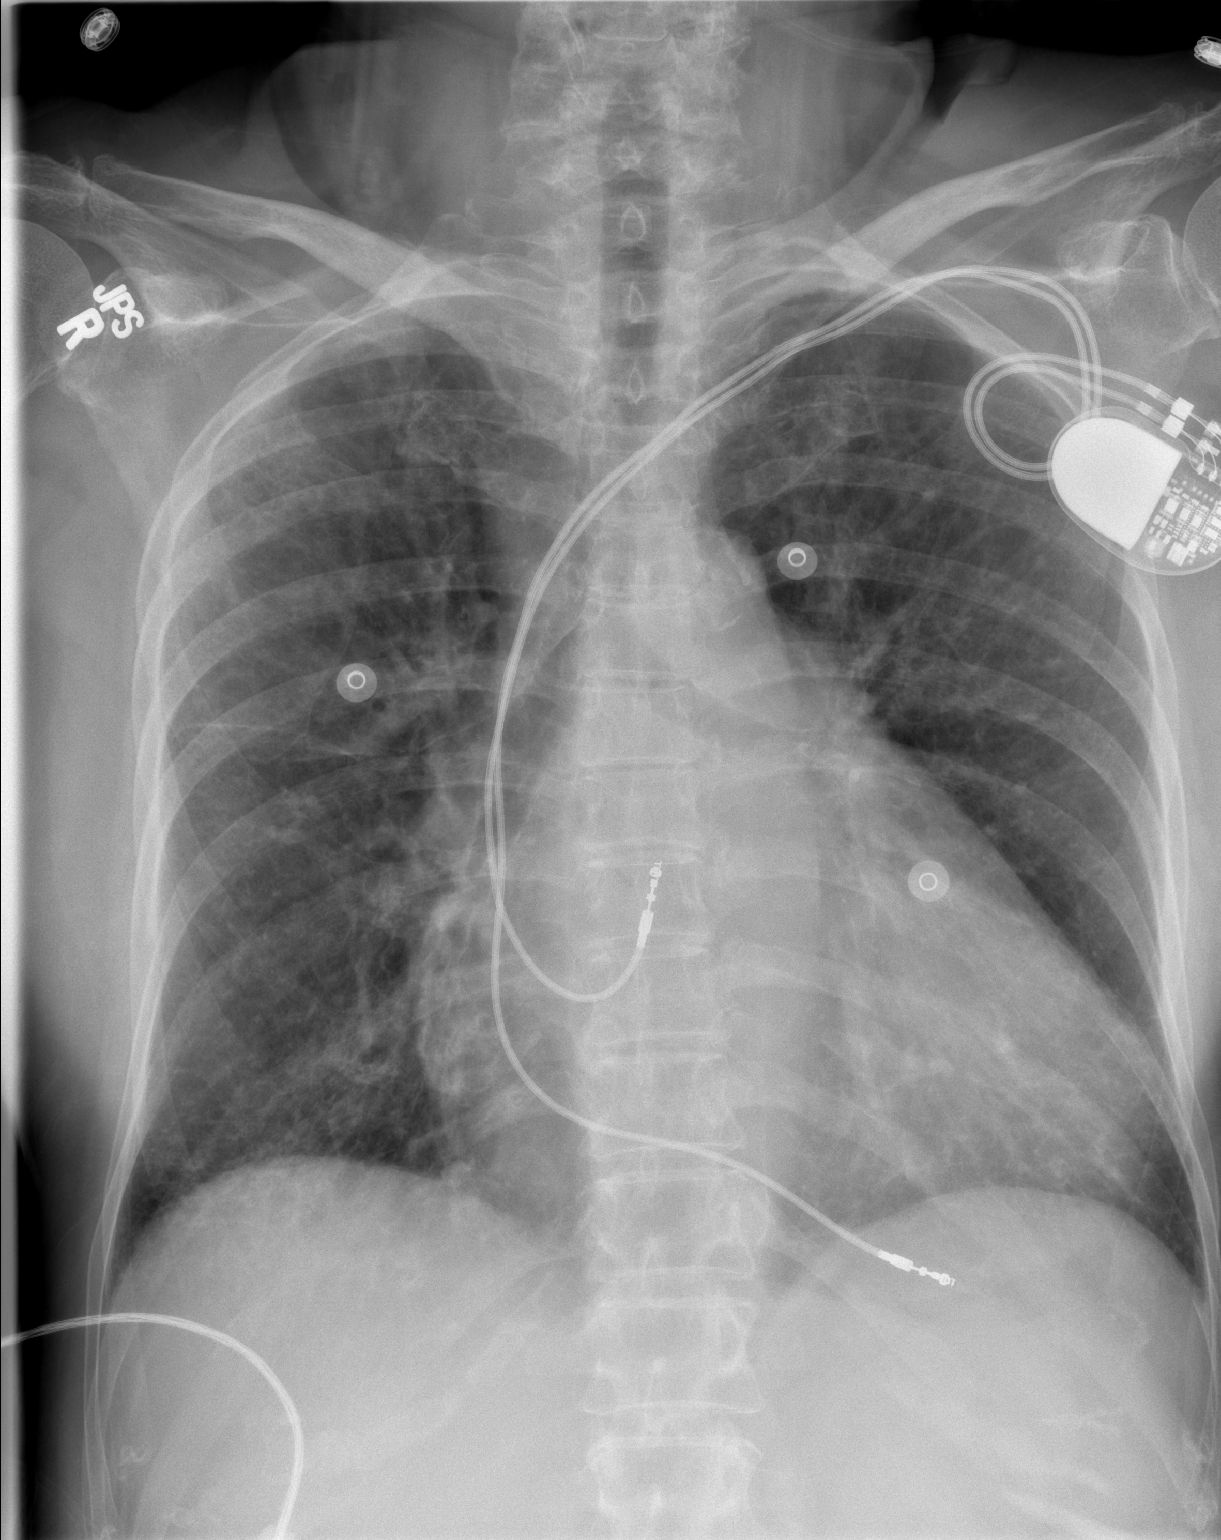

[1 of 1 positions shown; findings below may reference images not displayed]

FINDINGS: Cardiomegaly again noted. Dual lead cardiac pacemaker is unchanged
in position. There is mild interstitial prominence bilaterally
without convincing pulmonary edema. No gross fractures are
identified. No segmental infiltrate. No diagnostic pneumothorax.
IMPRESSION: There is mild interstitial prominence bilaterally without convincing
pulmonary edema. No gross fractures are identified. No segmental
infiltrate. No diagnostic pneumothorax.

## 2015-02-02 IMAGING — CT CT CERVICAL SPINE W/O CM
3 of 5 series · 10 of 33 positions shown, 11 images · non-contrast
Comparison: 04/01/2013

CLINICAL DATA: Fall, dizziness

EXAM:
CT HEAD WITHOUT CONTRAST
CT CERVICAL SPINE WITHOUT CONTRAST
TECHNIQUE: Multidetector CT imaging of the head and cervical spine was
performed following the standard protocol without intravenous
contrast. Multiplanar CT image reconstructions of the cervical spine
were also generated.

[Series 7: sagittals · sagittal · 0.17mm/px · 5 of 43 slices shown]
[im 15/43  bone]
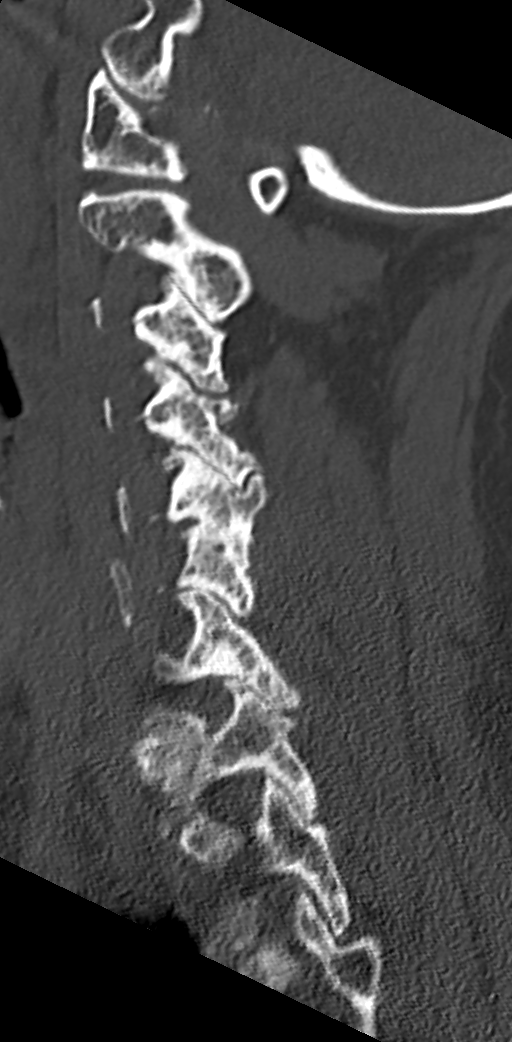
[im 18/43  bone]
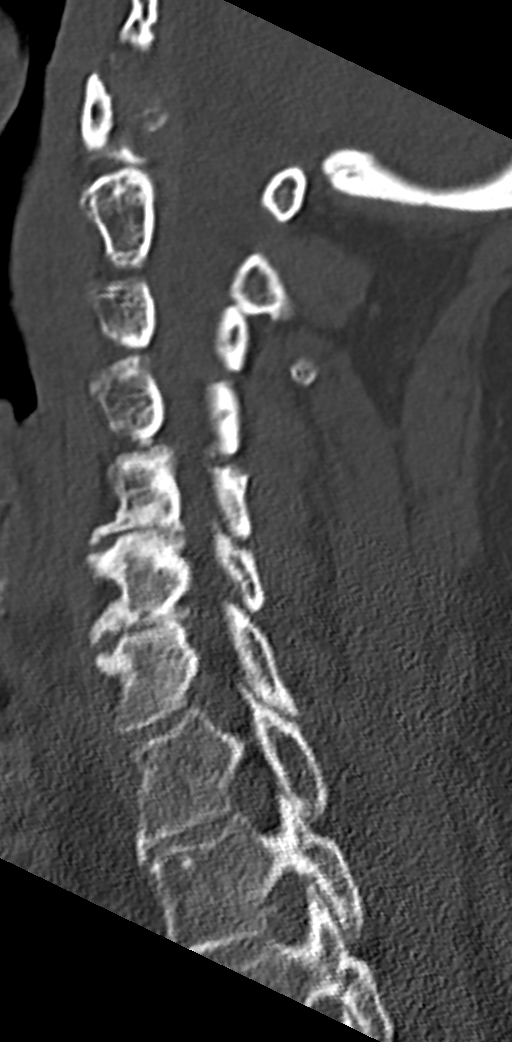
[im 22/43  bone]
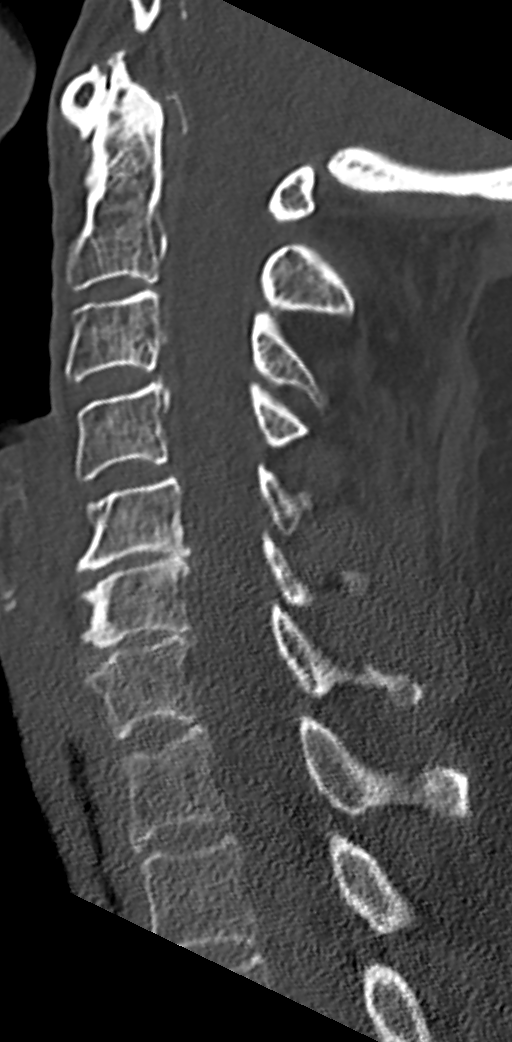
[im 25/43  bone]
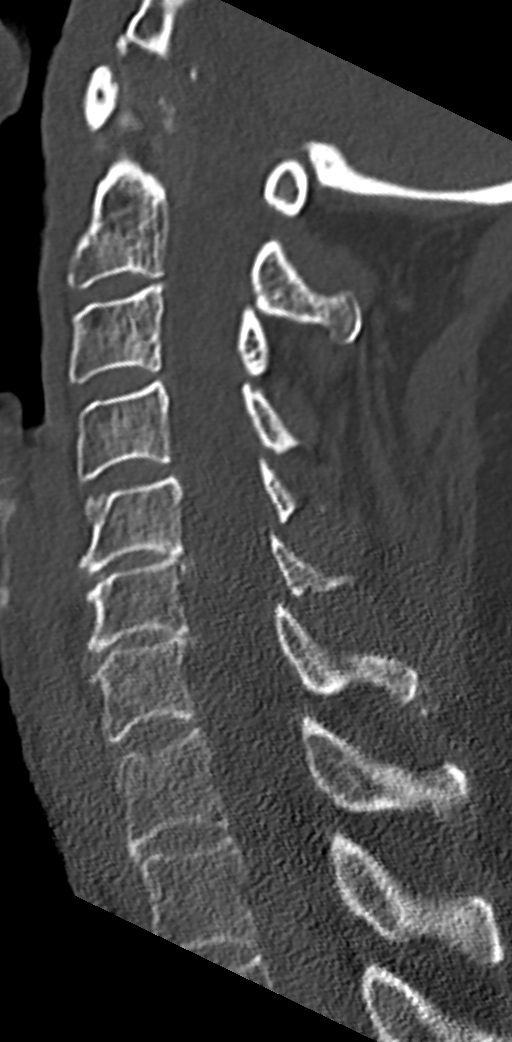
[im 29/43  bone]
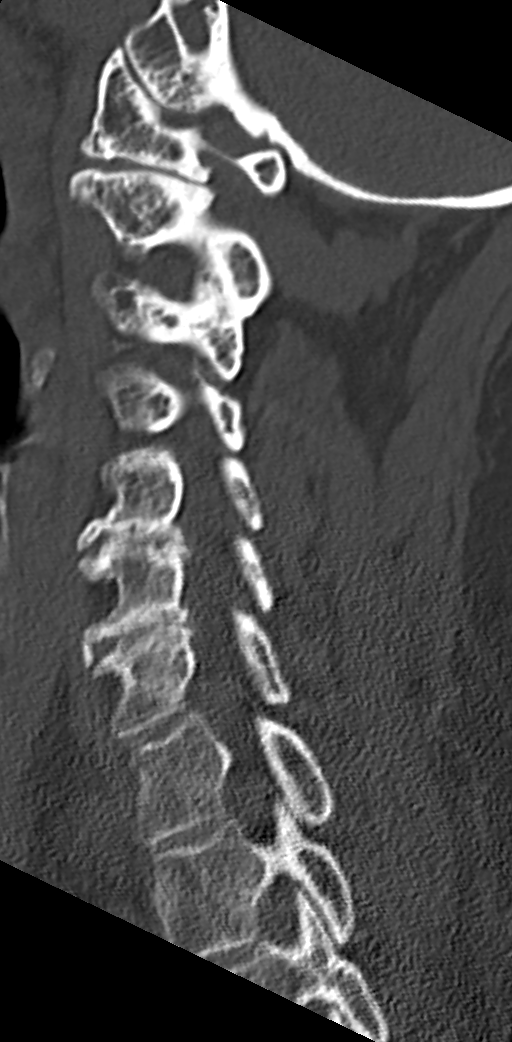

[Series 8: orthogonals · axial · 0.23mm/px · z∈[-190,-127]mm · 2 of 90 slices shown, 3 images]
[im 36/90  soft-tissue]
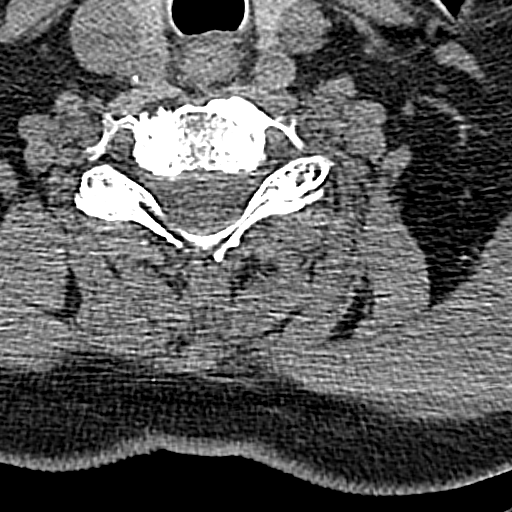
[im 36/90  bone]
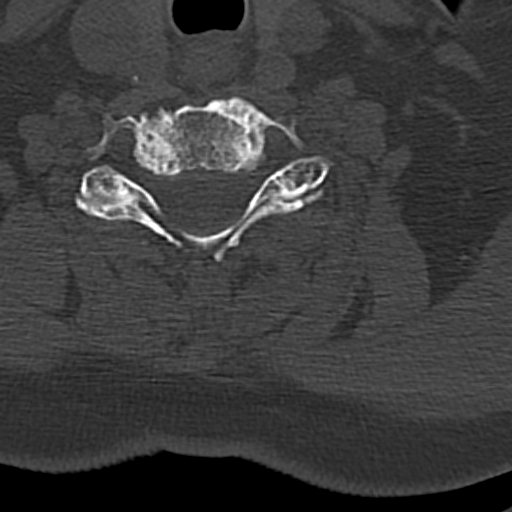
[im 72/90  bone]
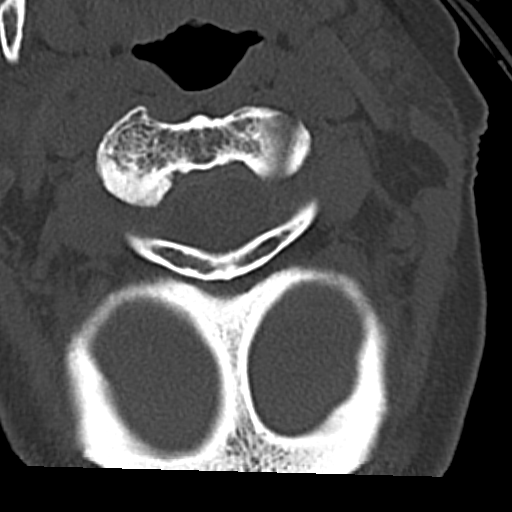

[Series 9: cor · coronal · 0.21mm/px · 3 of 42 slices shown]
[im 9/42  bone]
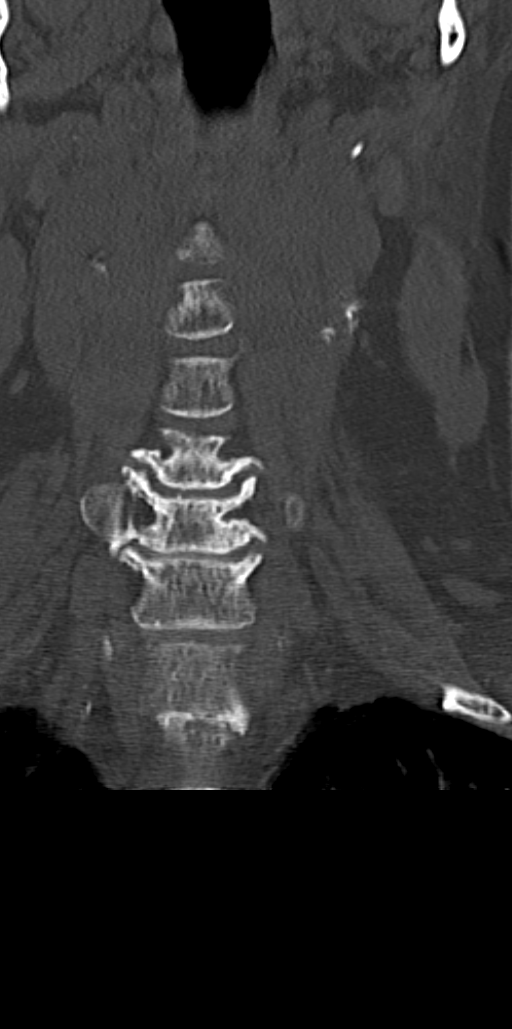
[im 17/42  bone]
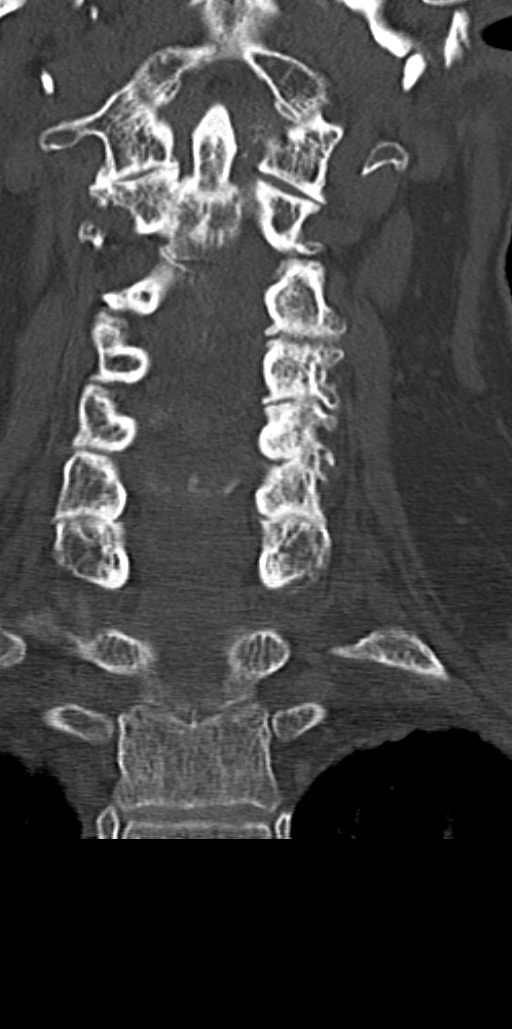
[im 25/42  bone]
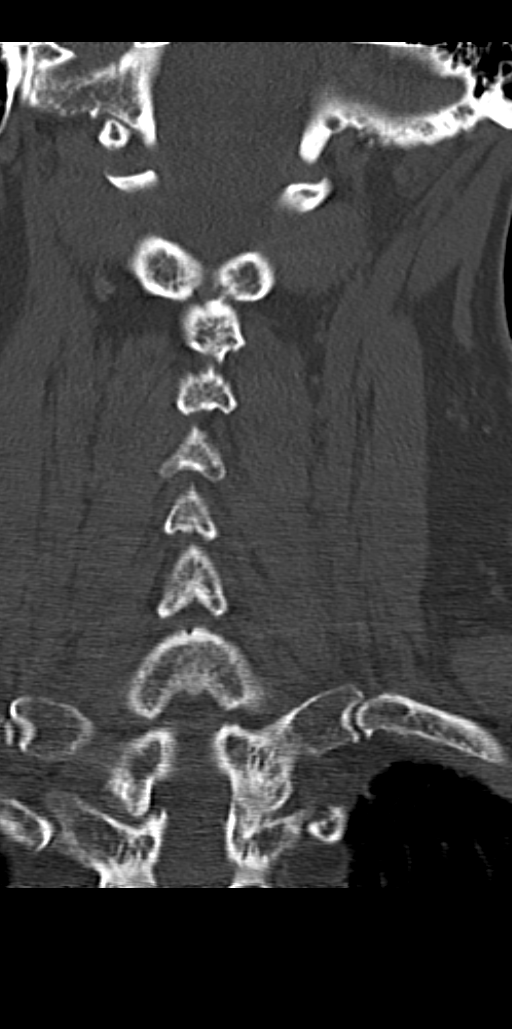

[10 of 33 positions shown; findings below may reference images not displayed]

FINDINGS: CT HEAD FINDINGS

No skull fracture is noted. Paranasal sinuses and mastoid air cells
are unremarkable.

No intracranial hemorrhage, mass effect or midline shift. No acute
cortical infarction. No mass lesion is noted on this unenhanced
scan. Stable cerebral atrophy. Stable periventricular and patchy
subcortical chronic white matter disease. Ventricular size is stable
from prior exam.

CT CERVICAL SPINE FINDINGS

Comparison exam 05/31/2013
FINDINGS: Axial images of the cervical spine shows no acute fracture or
subluxation.

Computer processed images shows no acute fracture or subluxation.
Stable degenerative changes C1-C2 articulation. Again noted disc
space flattening with mild anterior and minimal posterior spurring
at C5-C6 and C6-C7 level. Minimal disc space flattening at C7-T1
level. No prevertebral soft tissue swelling. Cervical airway is
patent.

There is no pneumothorax in visualized lung apices. Bilateral apical
scarring is noted.
IMPRESSION: CT HEAD IMPRESSION

No acute intracranial abnormality. Stable atrophy and chronic white
matter disease.

CT CERVICAL SPINE IMPRESSION

1. No acute fracture or subluxation. Stable degenerative changes as
described above.

## 2015-02-06 IMAGING — CT CT T SPINE W/O CM
3 of 5 series · 13 of 33 positions shown, 16 images · non-contrast
Comparison: Radiographs dated 08/05/2013

CLINICAL DATA: T8 compression fracture. Acute thoracic spine pain.

EXAM:
CT THORACIC SPINE WITHOUT CONTRAST
TECHNIQUE: Multidetector CT imaging of the thoracic spine was performed without
intravenous contrast administration. Multiplanar CT image
reconstructions were also generated.

[Series 4: t-spine 2.0 i30s 3 · axial · 0.39mm/px · z∈[+1404,+1668]mm · 6 of 173 slices shown, 8 images]
[im 27/173  soft-tissue]
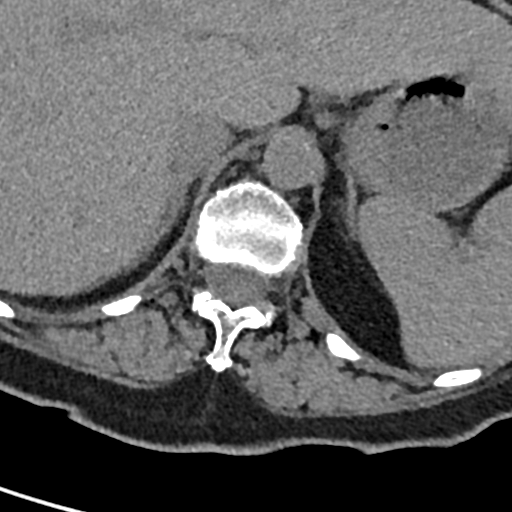
[im 27/173  bone]
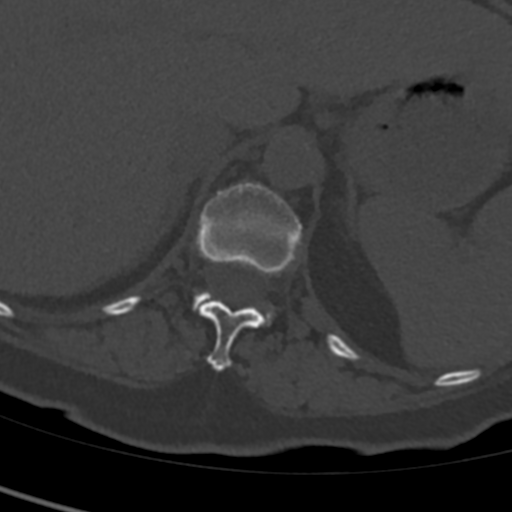
[im 53/173  bone]
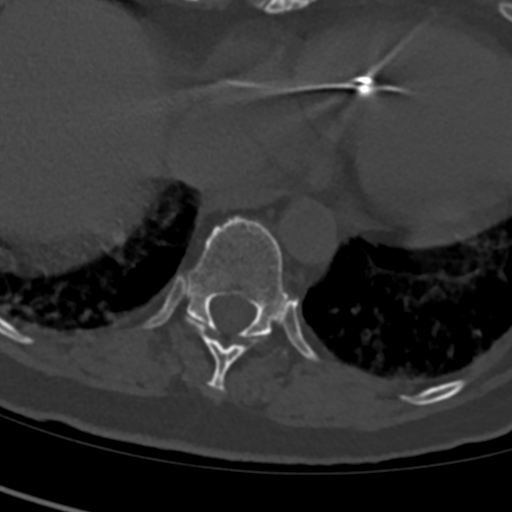
[im 80/173  bone]
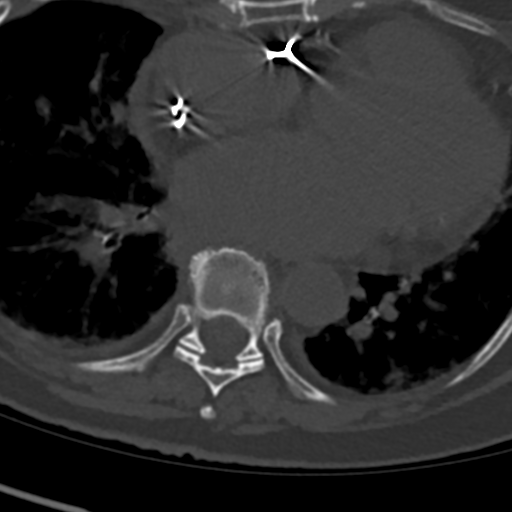
[im 106/173  bone]
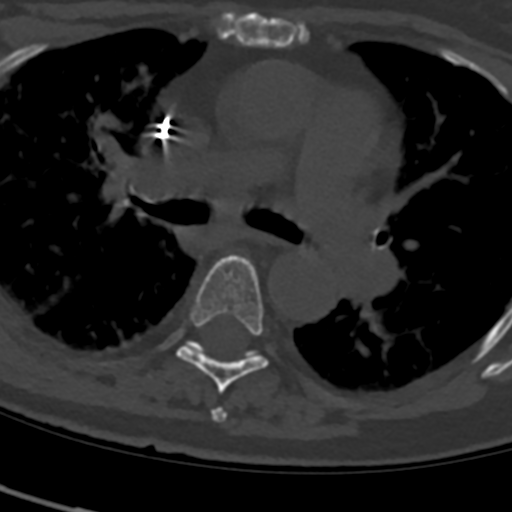
[im 133/173  soft-tissue]
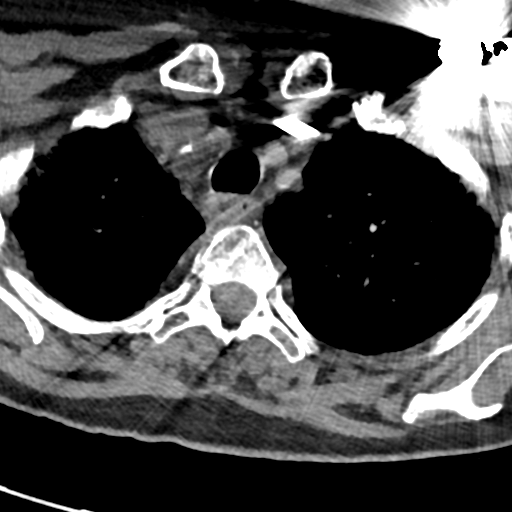
[im 133/173  bone]
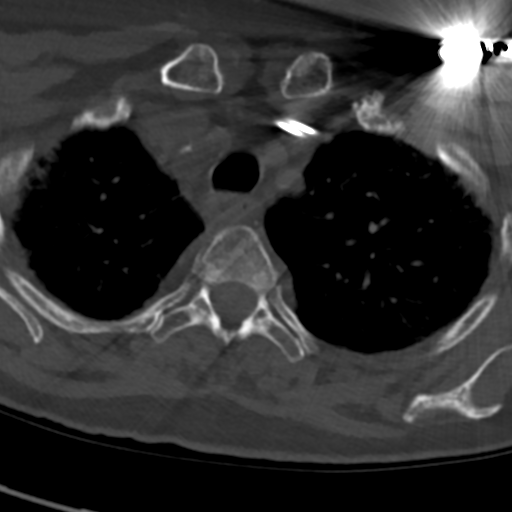
[im 159/173  bone]
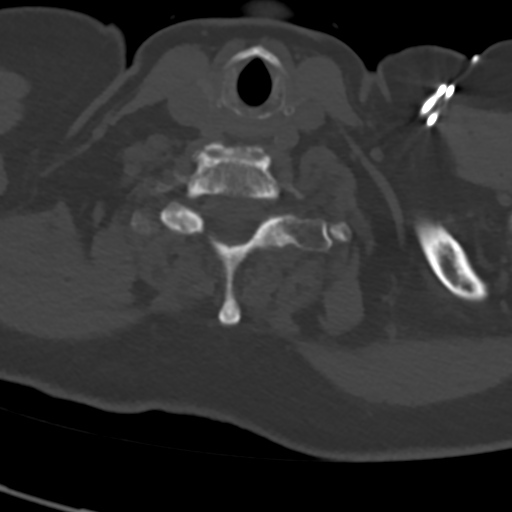

[Series 8: sagittal st · sagittal · 0.51mm/px · 5 of 55 slices shown, 6 images]
[im 19/55  bone]
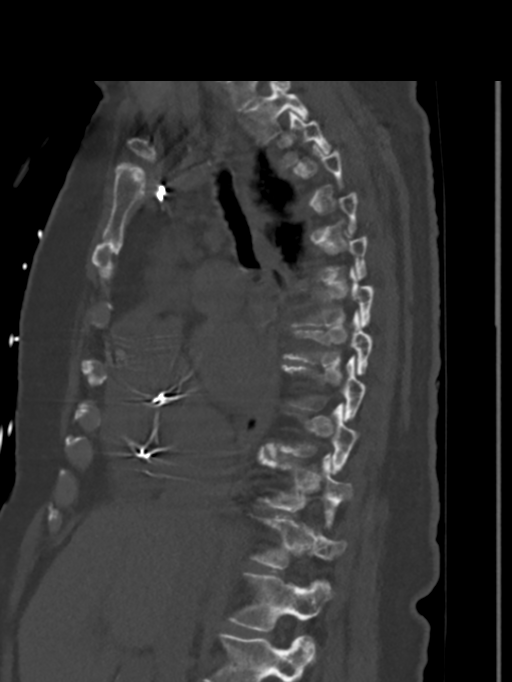
[im 23/55  bone]
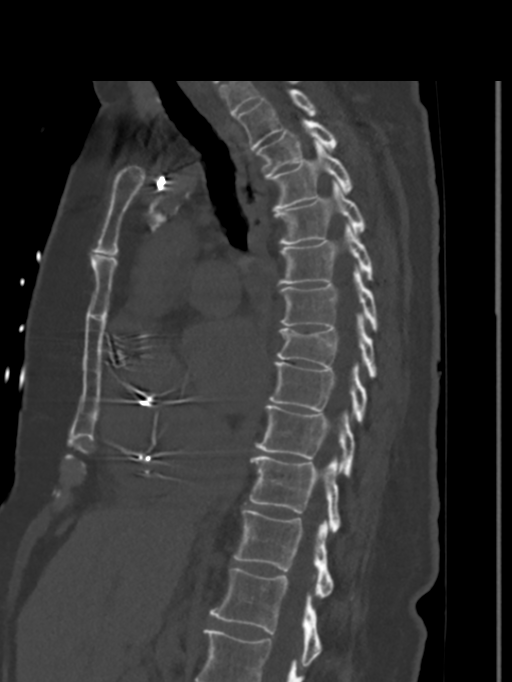
[im 28/55  soft-tissue]
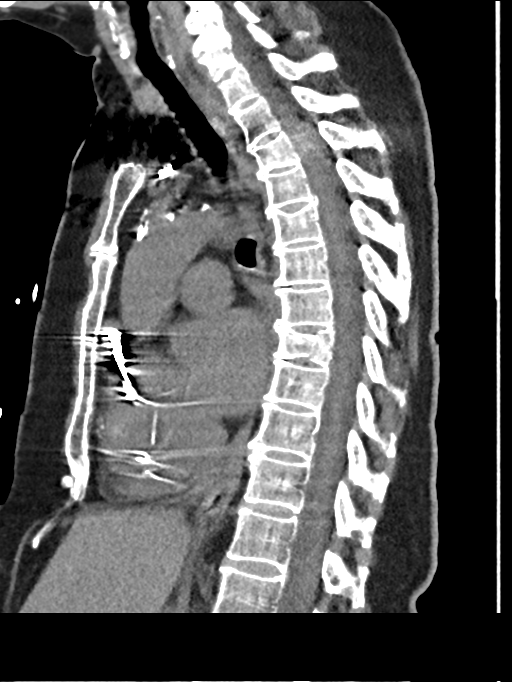
[im 28/55  bone]
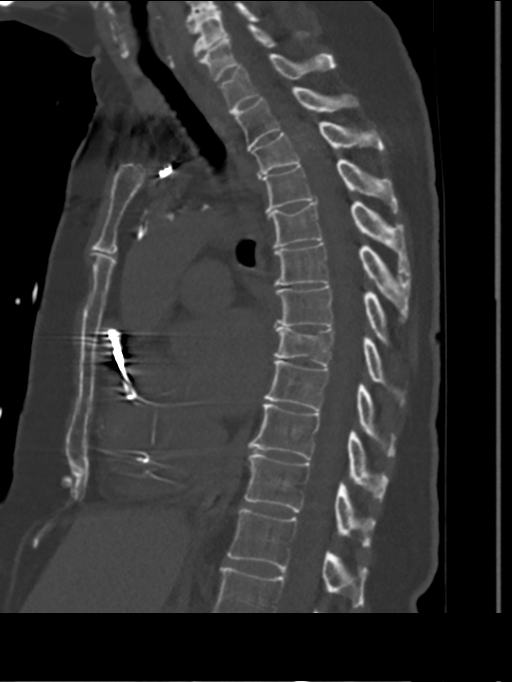
[im 32/55  bone]
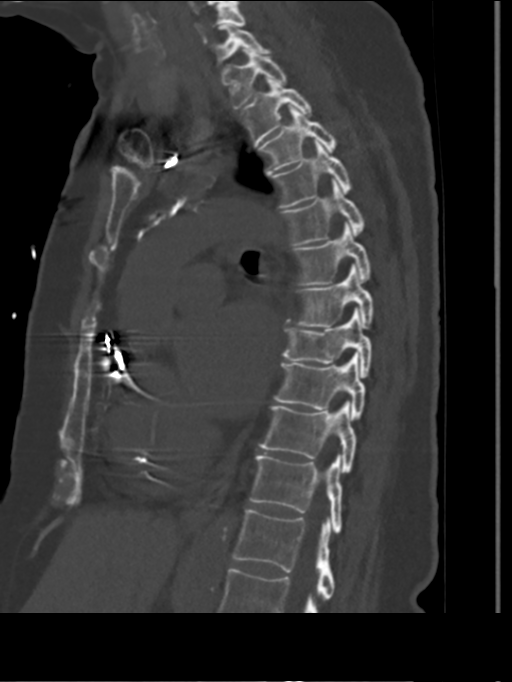
[im 37/55  bone]
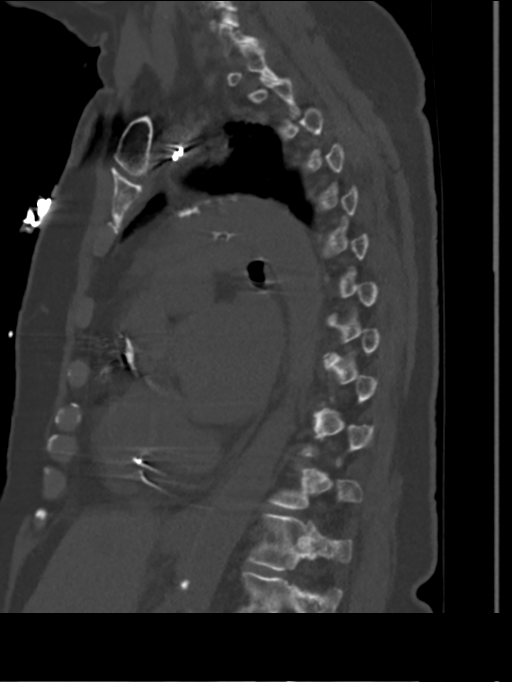

[Series 9: bone cor 2 · coronal · 0.39mm/px · 2 of 72 slices shown]
[im 39/72  bone]
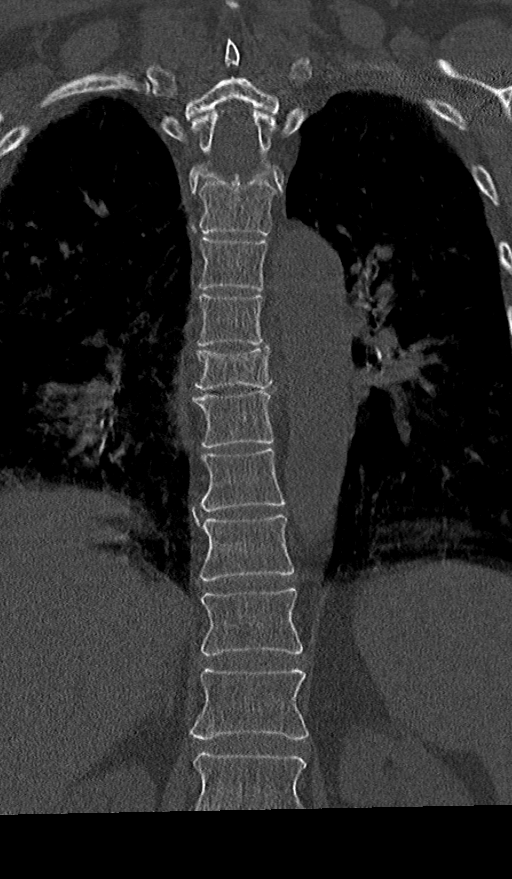
[im 55/72  bone]
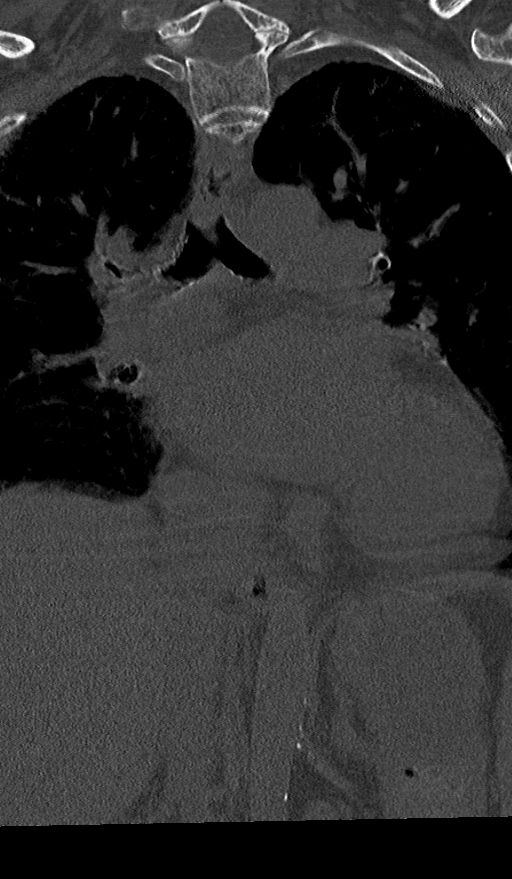

[13 of 33 positions shown; findings below may reference images not displayed]

FINDINGS: There is a slight compression fracture of the anterior inferior
aspect of the T3 vertebral body extending along the right side of
the inferior endplate. There is no protrusion of bone or disc
material or hemorrhage in the spinal canal.

There is a slightly more prominent but slight compression fracture
involving the T8 vertebral body. The fracture extends through the
base of the left pedicle. There is no protrusion of bone or disc
material or hemorrhage in the spinal canal. There is approximately
15% loss of anterior vertebral body height.

No other significant abnormality of the thoracic spine.
IMPRESSION: 1. Slight compression fractures of T3 and T8. These appear to be
benign osteoporotic fractures.
2. There is no protrusion of bone, disc, or hemorrhage in the spinal
canal at the site of the fractures.

## 2015-02-07 ENCOUNTER — Emergency Department: Admit: 2015-02-07 | Disposition: A | Discharge status: Home / Self Care | Payer: Self-pay | Admitting: Student

## 2015-02-07 LAB — URINALYSIS, COMPLETE
BILIRUBIN, UR: NEGATIVE
BLOOD: NEGATIVE
Glucose,UR: 150 mg/dL (ref 0–75)
Ketone: NEGATIVE
Nitrite: POSITIVE
PH: 7 (ref 4.5–8.0)
RBC, UR: NONE SEEN /HPF (ref 0–5)
Specific Gravity: 1.005 (ref 1.003–1.030)
WBC UR: 14 /HPF (ref 0–5)

## 2015-02-07 LAB — DRUG SCREEN, URINE
AMPHETAMINES, UR SCREEN: NEGATIVE
BENZODIAZEPINE, UR SCRN: POSITIVE
Barbiturates, Ur Screen: NEGATIVE
CANNABINOID 50 NG, UR ~~LOC~~: NEGATIVE
Cocaine Metabolite,Ur ~~LOC~~: NEGATIVE
MDMA (Ecstasy)Ur Screen: NEGATIVE
METHADONE, UR SCREEN: NEGATIVE
Opiate, Ur Screen: POSITIVE
PHENCYCLIDINE (PCP) UR S: NEGATIVE
Tricyclic, Ur Screen: NEGATIVE

## 2015-02-07 LAB — CBC
HCT: 38.8 % (ref 35.0–47.0)
HGB: 13.4 g/dL (ref 12.0–16.0)
MCH: 33.4 pg (ref 26.0–34.0)
MCHC: 34.5 g/dL (ref 32.0–36.0)
MCV: 97 fL (ref 80–100)
Platelet: 245 10*3/uL (ref 150–440)
RBC: 4 10*6/uL (ref 3.80–5.20)
RDW: 14 % (ref 11.5–14.5)
WBC: 7.1 10*3/uL (ref 3.6–11.0)

## 2015-02-07 LAB — COMPREHENSIVE METABOLIC PANEL
ALK PHOS: 75 U/L
ALT: 10 U/L — AB
ANION GAP: 8 (ref 7–16)
Albumin: 4.2 g/dL
BUN: 17 mg/dL
Bilirubin,Total: 0.5 mg/dL
Calcium, Total: 10 mg/dL
Chloride: 100 mmol/L — ABNORMAL LOW
Co2: 27 mmol/L
Creatinine: 0.72 mg/dL
EGFR (African American): >60
EGFR (Non-African Amer.): >60
Glucose: 172 mg/dL — ABNORMAL HIGH
Potassium: 3.8 mmol/L
SGOT(AST): 23 U/L
SODIUM: 135 mmol/L
TOTAL PROTEIN: 7.4 g/dL

## 2015-02-07 LAB — ETHANOL: Ethanol: <5 mg/dL

## 2015-02-07 LAB — ACETAMINOPHEN LEVEL: Acetaminophen: <10 ug/mL

## 2015-02-07 LAB — SALICYLATE LEVEL: Salicylates, Serum: <4 mg/dL

## 2015-02-07 LAB — TSH: Thyroid Stimulating Horm: 1.674 u[IU]/mL

## 2015-03-06 ENCOUNTER — Emergency Department
Admit: 2015-03-06 | Disposition: A | Discharge status: Home / Self Care | Payer: Self-pay | Admitting: Emergency Medicine

## 2015-03-06 LAB — CBC WITH DIFFERENTIAL/PLATELET
BASOS PCT: 0.7 %
Basophil #: 0 10*3/uL (ref 0.0–0.1)
Eosinophil #: 0.2 10*3/uL (ref 0.0–0.7)
Eosinophil %: 4.4 %
HCT: 39.8 % (ref 35.0–47.0)
HGB: 13.3 g/dL (ref 12.0–16.0)
LYMPHS ABS: 0.9 10*3/uL — AB (ref 1.0–3.6)
Lymphocyte %: 18.4 %
MCH: 32.7 pg (ref 26.0–34.0)
MCHC: 33.5 g/dL (ref 32.0–36.0)
MCV: 98 fL (ref 80–100)
MONOS PCT: 10.3 %
Monocyte #: 0.5 x10 3/mm (ref 0.2–0.9)
NEUTROS ABS: 3.1 10*3/uL (ref 1.4–6.5)
Neutrophil %: 66.2 %
Platelet: 177 10*3/uL (ref 150–440)
RBC: 4.08 10*6/uL (ref 3.80–5.20)
RDW: 13.6 % (ref 11.5–14.5)
WBC: 4.7 10*3/uL (ref 3.6–11.0)

## 2015-03-06 LAB — COMPREHENSIVE METABOLIC PANEL
ALK PHOS: 65 U/L
AST: 24 U/L
Albumin: 4.3 g/dL
Anion Gap: 7 (ref 7–16)
BUN: 14 mg/dL
Bilirubin,Total: 0.5 mg/dL
CO2: 28 mmol/L
Calcium, Total: 9.4 mg/dL
Chloride: 102 mmol/L
Creatinine: 0.77 mg/dL
EGFR (African American): >60
Glucose: 132 mg/dL — ABNORMAL HIGH
POTASSIUM: 3.8 mmol/L
SGPT (ALT): 8 U/L — ABNORMAL LOW
Sodium: 137 mmol/L
Total Protein: 7 g/dL

## 2015-03-08 ENCOUNTER — Inpatient Hospital Stay
Admission: AD | Admit: 2015-03-08 | Discharge: 2015-03-11 | Disposition: A | Discharge status: Home / Self Care | Payer: Medicare Other | Source: Ambulatory Visit | Attending: Internal Medicine | Admitting: Internal Medicine

## 2015-03-08 DIAGNOSIS — I4891 Unspecified atrial fibrillation: Secondary | ICD-10-CM | POA: Diagnosis present

## 2015-03-08 LAB — COMPREHENSIVE METABOLIC PANEL
AST: 26 U/L
Albumin: 4.6 g/dL
Alkaline Phosphatase: 70 U/L
Anion Gap: 10 (ref 7–16)
BUN: 16 mg/dL
Bilirubin,Total: 0.9 mg/dL
CALCIUM: 9.4 mg/dL
CHLORIDE: 93 mmol/L — AB
Co2: 25 mmol/L
Creatinine: 0.67 mg/dL
EGFR (African American): >60
EGFR (Non-African Amer.): >60
Glucose: 182 mg/dL — ABNORMAL HIGH
Potassium: 4 mmol/L
SGPT (ALT): 11 U/L — ABNORMAL LOW
Sodium: 128 mmol/L — ABNORMAL LOW
Total Protein: 7.9 g/dL

## 2015-03-08 LAB — PHOSPHORUS: Phosphorus: 3.1 mg/dL

## 2015-03-08 LAB — URINALYSIS, COMPLETE
BACTERIA: NONE SEEN
BILIRUBIN, UR: NEGATIVE
Blood: NEGATIVE
Glucose,UR: 50 mg/dL (ref 0–75)
Leukocyte Esterase: NEGATIVE
NITRITE: POSITIVE
Ph: 6 (ref 4.5–8.0)
Protein: 100
SPECIFIC GRAVITY: 1.017 (ref 1.003–1.030)
Squamous Epithelial: NONE SEEN

## 2015-03-08 LAB — CBC
HCT: 41.5 % (ref 35.0–47.0)
HGB: 14.3 g/dL (ref 12.0–16.0)
MCH: 33 pg (ref 26.0–34.0)
MCHC: 34.5 g/dL (ref 32.0–36.0)
MCV: 96 fL (ref 80–100)
PLATELETS: 177 10*3/uL (ref 150–440)
RBC: 4.35 10*6/uL (ref 3.80–5.20)
RDW: 13.2 % (ref 11.5–14.5)
WBC: 8.1 10*3/uL (ref 3.6–11.0)

## 2015-03-08 LAB — TROPONIN I: Troponin-I: <0.03 ng/mL

## 2015-03-08 LAB — MAGNESIUM: Magnesium: 1.6 mg/dL — ABNORMAL LOW

## 2015-03-09 DIAGNOSIS — E871 Hypo-osmolality and hyponatremia: Secondary | ICD-10-CM

## 2015-03-09 DIAGNOSIS — I270 Primary pulmonary hypertension: Secondary | ICD-10-CM | POA: Diagnosis not present

## 2015-03-09 DIAGNOSIS — G9340 Encephalopathy, unspecified: Secondary | ICD-10-CM | POA: Diagnosis not present

## 2015-03-09 DIAGNOSIS — I340 Nonrheumatic mitral (valve) insufficiency: Secondary | ICD-10-CM | POA: Diagnosis not present

## 2015-03-09 DIAGNOSIS — I4891 Unspecified atrial fibrillation: Secondary | ICD-10-CM | POA: Diagnosis not present

## 2015-03-09 DIAGNOSIS — A419 Sepsis, unspecified organism: Secondary | ICD-10-CM

## 2015-03-09 LAB — CBC WITH DIFFERENTIAL/PLATELET
Basophil #: 0 10*3/uL (ref 0.0–0.1)
Basophil %: 0.2 %
EOS PCT: 0 %
Eosinophil #: 0 10*3/uL (ref 0.0–0.7)
HCT: 39.4 % (ref 35.0–47.0)
HGB: 13.3 g/dL (ref 12.0–16.0)
LYMPHS ABS: 0.5 10*3/uL — AB (ref 1.0–3.6)
Lymphocyte %: 4.9 %
MCH: 32.4 pg (ref 26.0–34.0)
MCHC: 33.8 g/dL (ref 32.0–36.0)
MCV: 96 fL (ref 80–100)
MONOS PCT: 6.9 %
Monocyte #: 0.7 x10 3/mm (ref 0.2–0.9)
NEUTROS ABS: 9 10*3/uL — AB (ref 1.4–6.5)
Neutrophil %: 88 %
Platelet: 180 10*3/uL (ref 150–440)
RBC: 4.1 10*6/uL (ref 3.80–5.20)
RDW: 13.2 % (ref 11.5–14.5)
WBC: 10.2 10*3/uL (ref 3.6–11.0)

## 2015-03-09 LAB — BASIC METABOLIC PANEL
Anion Gap: 9 (ref 7–16)
BUN: 18 mg/dL
Calcium, Total: 9.1 mg/dL
Chloride: 92 mmol/L — ABNORMAL LOW
Co2: 26 mmol/L
Creatinine: 0.85 mg/dL
EGFR (African American): >60
EGFR (Non-African Amer.): >60
Glucose: 218 mg/dL — ABNORMAL HIGH
Potassium: 4.1 mmol/L
Sodium: 127 mmol/L — ABNORMAL LOW

## 2015-03-09 LAB — TROPONIN I
Troponin-I: 0.04 ng/mL — ABNORMAL HIGH
Troponin-I: 0.03 ng/mL

## 2015-03-09 LAB — LIPASE, BLOOD: Lipase: 28 U/L

## 2015-03-09 LAB — RAPID INFLUENZA A&B ANTIGENS

## 2015-03-10 ENCOUNTER — Telehealth: Payer: Self-pay | Admitting: Cardiology

## 2015-03-10 DIAGNOSIS — I4891 Unspecified atrial fibrillation: Secondary | ICD-10-CM

## 2015-03-10 LAB — BASIC METABOLIC PANEL
Anion Gap: 9 (ref 7–16)
BUN: 20 mg/dL
CHLORIDE: 98 mmol/L — AB
CO2: 28 mmol/L
CREATININE: 0.73 mg/dL
Calcium, Total: 9.3 mg/dL
EGFR (African American): >60
EGFR (Non-African Amer.): >60
Glucose: 206 mg/dL — ABNORMAL HIGH
Potassium: 4.1 mmol/L
Sodium: 135 mmol/L

## 2015-03-10 LAB — CBC WITH DIFFERENTIAL/PLATELET
BASOS ABS: 0 10*3/uL (ref 0.0–0.1)
BASOS PCT: 0.1 %
EOS ABS: 0 10*3/uL (ref 0.0–0.7)
Eosinophil %: 0 %
HCT: 38.9 % (ref 35.0–47.0)
HGB: 13.3 g/dL (ref 12.0–16.0)
LYMPHS PCT: 7.1 %
Lymphocyte #: 0.4 10*3/uL — ABNORMAL LOW (ref 1.0–3.6)
MCH: 32.7 pg (ref 26.0–34.0)
MCHC: 34.2 g/dL (ref 32.0–36.0)
MCV: 96 fL (ref 80–100)
MONO ABS: 0.3 x10 3/mm (ref 0.2–0.9)
Monocyte %: 4.9 %
NEUTROS PCT: 87.9 %
Neutrophil #: 4.7 10*3/uL (ref 1.4–6.5)
PLATELETS: 174 10*3/uL (ref 150–440)
RBC: 4.07 10*6/uL (ref 3.80–5.20)
RDW: 13.3 % (ref 11.5–14.5)
WBC: 5.3 10*3/uL (ref 3.6–11.0)

## 2015-03-10 NOTE — Telephone Encounter (Signed)
New problem   Pt's daughter stated [facility] and she has some concerns about her mom having CHF.

## 2015-03-10 NOTE — Telephone Encounter (Signed)
Spoke with patient and she stated [facility]  Daughter was concerned about CHF and wanting to know if anything she should be aware of. Chest xray done, explained that BNP could be done. Daughter appreciated call back

## 2015-03-11 LAB — CULTURE, BLOOD (SINGLE)

## 2015-03-11 MED ORDER — INSULIN ASPART 100 UNIT/ML ~~LOC~~ SOLN: 0.0000 [IU] | Freq: Four times a day (QID) | SUBCUTANEOUS | Status: DC

## 2015-03-11 MED ORDER — DEXTROSE 5 % IV SOLN: 2.0000 g | Freq: Two times a day (BID) | INTRAVENOUS | Status: DC

## 2015-03-11 MED ORDER — METFORMIN HCL 500 MG PO TABS: 500.0000 mg | ORAL_TABLET | Freq: Two times a day (BID) | ORAL | Status: DC

## 2015-03-11 MED ORDER — PNEUMOCOCCAL VAC POLYVALENT 25 MCG/0.5ML IJ INJ: 0.5000 mL | INJECTION | INTRAMUSCULAR | Status: DC | PRN

## 2015-03-11 MED ORDER — ENOXAPARIN SODIUM 40 MG/0.4ML ~~LOC~~ SOLN: 40.0000 mg | SUBCUTANEOUS | Status: DC

## 2015-03-11 MED ORDER — SERTRALINE HCL 50 MG PO TABS: 50.0000 mg | ORAL_TABLET | Freq: Every day | ORAL | Status: DC

## 2015-03-11 MED ORDER — ONDANSETRON HCL 4 MG/2ML IJ SOLN: 4.0000 mg | INTRAMUSCULAR | Status: DC | PRN

## 2015-03-11 MED ORDER — METHYLPREDNISOLONE SODIUM SUCC 125 MG IJ SOLR: 60.0000 mg | Freq: Every day | INTRAMUSCULAR | Status: DC

## 2015-03-11 MED ORDER — BUSPIRONE HCL 5 MG PO TABS: 10.0000 mg | ORAL_TABLET | Freq: Two times a day (BID) | ORAL | Status: DC

## 2015-03-11 MED ORDER — MENTHOL 3 MG MT LOZG: 1.0000 | LOZENGE | OROMUCOSAL | Status: DC | PRN

## 2015-03-11 MED ORDER — ACETAMINOPHEN 325 MG PO TABS: 650.0000 mg | ORAL_TABLET | ORAL | Status: DC | PRN

## 2015-03-11 MED ORDER — LOSARTAN POTASSIUM 50 MG PO TABS: 50.0000 mg | ORAL_TABLET | Freq: Every day | ORAL | Status: DC

## 2015-03-11 MED ORDER — MOMETASONE FURO-FORMOTEROL FUM 100-5 MCG/ACT IN AERO
2.0000 | INHALATION_SPRAY | Freq: Two times a day (BID) | RESPIRATORY_TRACT | Status: DC
  Filled 2015-03-11: qty 8.8

## 2015-03-11 MED ORDER — DILTIAZEM HCL ER COATED BEADS 240 MG PO CP24: 240.0000 mg | ORAL_CAPSULE | Freq: Every day | ORAL | Status: DC

## 2015-03-11 MED ORDER — DOCUSATE SODIUM 100 MG PO CAPS: 100.0000 mg | ORAL_CAPSULE | Freq: Two times a day (BID) | ORAL | Status: DC | PRN

## 2015-03-11 MED ORDER — MORPHINE SULFATE 2 MG/ML IJ SOLN: 1.0000 mg | INTRAMUSCULAR | Status: DC | PRN

## 2015-03-11 MED ORDER — IPRATROPIUM-ALBUTEROL 0.5-2.5 (3) MG/3ML IN SOLN: 3.0000 mL | Freq: Four times a day (QID) | RESPIRATORY_TRACT | Status: DC

## 2015-03-11 MED ORDER — SENNA 8.6 MG PO TABS: 1.0000 | ORAL_TABLET | Freq: Two times a day (BID) | ORAL | Status: DC | PRN

## 2015-03-11 MED ORDER — BUDESONIDE 0.5 MG/2ML IN SUSP: 0.5000 mg | Freq: Two times a day (BID) | RESPIRATORY_TRACT | Status: DC

## 2015-03-12 LAB — EXPECTORATED SPUTUM ASSESSMENT W GRAM STAIN, RFLX TO RESP C

## 2015-03-12 LAB — URINE CULTURE

## 2015-03-12 NOTE — Consult Note (Signed)
General Aspect Primary Cardiologist: Dr. Mare Ferrari, MD ________________  79 year old female with history of chronic a-fib not on longterm anticoagulation 2/2 frequent falls s/p MDT PPM, prior intracranial hemorrhage 2/2 fall, hypertrophic CM, TIA, chronic diastolic CHF, pulmonary HTN, COPD on home O2 at 3L, HTN, HLD, DM2, anemia, and anxiety, depression who presented to [facility] on 4/27 after suffering a fall and hitting her head.  ______________  Past Medical History?? ????? Hypertrophic cardiomyopathy?? ?? ?? ?? a. 11/2013 Echo: EF 55-60%, basal inf HK, sev dil LA, no evidence of HCM.?? PASP 68mHg.?? ????? Hypertension?? ?? ????? Situational mixed anxiety and depressive disorder?? ?? ????? Hypercholesterolemia?? ?? ????? Falls frequently?? ?? ?? ?? coumadin stopped?? ????? Pulmonary HTN?? ?? ?? ?? a. has had prior right heart cath in 2010; was felt that most likely due to elevated left sided pressures and would not benefit from vasodilator therapy;?? b. 11/2013 Echo: PASP 673mg.?? ????? Oxygen dependent?? ?? ?? ?? "3L 24/7" (08/05/2013)?? ????? COPD (chronic obstructive pulmonary disease)?? ?? ????? TIA (transient ischemic attack)?? ?? ????? Atrial fibrillation?? ?? ?? ?? a. Chronic; No longer on coumadin 2/2 frequent falls.?? ????? CHF (congestive heart failure)?? ?? ????? Pacemaker?? ?? ?? ?? a. 04/2012 [place] #: [id number].?? ????? Varicose veins?? ?? ????? History of pneumonia?? ?? ?? ?? "couple times; long time ago" (08/05/2013)?? ????? Type II diabetes mellitus?? ?? ????? Anemia?? ?? ?? ?? "as a child" (08/05/2013)?? ????? GERD (gastroesophageal reflux disease)?? ?? ????? H/O hiatal hernia?? ?? ????? Osteoarthritis?? ?? ????? Arthritis?? ?? ?? ?? "all over me; in all my joints" (08/05/2013)?? ????? Coccyx pain?? ?? ????? Gout?? ?? ?? ?? "right foot" (08/05/2013)?? ????? Anxiety?? ?? ????? Depression?? ?? ????? Chest pain?? ?? ?? ?? a. 08/2011 Myoview:  sm, fixed anteroseptal defect (attenuation), no ischemia, EF 62%.??  ????  Past Surgical History?? Procedure?? Laterality?? Date?? ????? Total knee arthroplasty?? Right?? 2011?? ????? Tonsillectomy?? ?? ?? ????? Appendectomy?? ?? ?? ????? Abdominal hysterectomy?? ?? ?? ?? ?? "partial" (08/05/2013)?? ????? Dilation and curettage of uterus?? ?? ?? ?? ?? "several" (08/05/2013)?? ????? Foot neuroma surgery?? Right?? ?? ????? Insert / replace / remove pacemaker?? ?? 1998; 2000's; 2014?? ????? Cardiac catheterization?? ?? ?? ?? ?? "more than once" (08/05/2013)?? ????? Back surgery?? ?? ?? ?? ?? "bone spur removed; mid back" (08/05/2013)?? ????? Pacemaker generator change?? N/A?? 04/24/2012?? ?? ?? Procedure: PACEMAKER GENERATOR CHANGE;?? Surgeon: StDeboraha SprangMD;?? [facility];?? Service: Cardiovascular;?? Laterality: N/A;?? ______________________   Present Illness [age]ear old female with the above problem list [facility] suffering a fall and hitting her head.  She has a history of chronic atrial fibrillation and frequent falls. Because of her frequent falls she is no longer on anticoagulation. She is s/p MDT PPM in 12/2011. She has known LVH. She had an echocardiogram on 03/01/13 and showed severe pulmonary hypertension with PA pressure 82 mmHg. Her echocardiogram 08/06/13 showed an ejection fraction of 60-65% and her pulmonary artery pressure was 71 mmHg. Her left ventricular ejection fraction was 60-65% and there was mild LVH. Her echocardiogram on 11/18/13 showed an ejection fraction of 55-60% and a pulmonary artery pressure of 60 and she has severe left atrial enlargement and moderate to severe right atrial enlargement. She has a past history of hypertrophic cardiomyopathy although this was not demonstrated on her most recent echo. She has history of being dehydrated, requiring her Lasix to be held. Patient is on Lasix 20 mg daily and is not  having any evidence of  fluid retention at this dose at baseline. In the past she has not done well if she gets dehydrated because of her past history of hypertrophic cardiomyopathy. The patient had a Lexi scan Myoview on 12/14/13 which showed no ischemia and her ejection fraction was 52%. Because of her frequent falls she has seen neurology at [facility]. She has seen Dr. Jackelyn Poling.?? Dr. Dimas Millin has diagnosed her with severe cervical stenosis which has caused numbness in her legs which is causing her to fall. She did suffer a fall 05/2014 that caused an intracranial bleed that was treated at [facility]   History is taken from prior notes as there is no family and she is demented. She presented to [facility] on 4/27 with persistent N/V followed by a fall. She was found to be in a-fib with RVR upon her arrival to [facility]. CT of the head and neck showed no bleed or fracture. On po Cardizem at nursing home. She received IV Cadizem without much improvement in her heart rate. She was started on diltiazem gtt with improvement in HR to the 70s this morning.   Labs showed troponin 0.03-->0.04-->0.03, K+ 3.8-->4.0-->4.1, Mg 1.6, UA showed UTI, echo is pending. CT of abd is pending.   Physical Exam:  GEN no acute distress, frail appearing   HEENT hearing intact to voice, moist oral mucosa   NECK supple  trachea midline  no JVD   RESP normal resp effort  clear BS   CARD Irregular rate and rhythm  Murmur   Murmur Systolic  2/6   Systolic Murmur Out flow   ABD denies tenderness  soft   LYMPH negative neck   EXTR negative edema   SKIN normal to palpation   NEURO motor/sensory function intact, unable to fully test   [facility] lethargic   Review of Systems:  Subjective/Chief Complaint relatively nonverbal   ROS Pt not able to provide ROS  lethargic - demented   Medications/Allergies Reviewed Medications/Allergies reviewed   Family & Social History:  Family and Social History:  Family History Cancer   Social History  positive  tobacco, negative ETOH, negative Illicit drugs   + Tobacco Prior (greater than 1 year)  40 pack years   Place of Living Nursing Home     Depression:    Osteoarthritis:    copd:    chronic oxygen use:    gerd:    chf:    diabetes:    htn:    frequent falls:    dementia:    brain hemmorrhage:    Pacemaker:    tonsilectomy:    appendectomy:    partial hysterectomy:   Home Medications: Medication Instructions Status  busPIRone 10 mg oral tablet 1 tab(s) orally 2 times a day Active  Breo Ellipta 100 mcg-25 mcg/inh inhalation powder 1 puff(s) inhaled once a day (in the morning) Active  traZODone 50 mg oral tablet 0.5 tab(s) orally once a day (at bedtime) Active  traZODone 12.5 milligram(s) orally every 8 hours, As Needed - for Agitation Active  Mapap 325 mg oral tablet 2 tab(s) orally every 6 hours, As Needed - for Fever, for Pain  Active  sertraline 50 mg oral tablet 1 tab(s) orally once a day (in the morning) Active  Azo-Standard 95 mg oral tablet 1 tab(s) orally once a day (in the morning) Active  buPROPion 150 mg/24 hours oral tablet, extended release 3 tab(s) orally once a day (in the morning) Active  diltiazem 240 mg/24  hours oral capsule, extended release 1 cap(s) orally once a day (in the morning) Active  Klor-Con 20 mEq oral tablet, extended release 1 tab(s) orally once a day (in the morning) Active  losartan 50 mg oral tablet 1 tab(s) orally once a day (in the morning) Active  oxybutynin 15 mg/24 hr oral tablet, extended release 1 tab(s) orally once a day (in the morning) Active  Tab-A-Vite with Iron Multiple Vitamins with Iron oral tablet 1 tab(s) orally once a day (in the morning) Active  docusate sodium 100 mg oral capsule 1 cap(s) orally 2 times a day Active  Tums 1 tab(s) orally every 6 hours, As Needed for reflux.  Active  acetaminophen-HYDROcodone 325 mg-5 mg oral tablet 1-2 tab(s) orally every 4 hours, As Needed - for Pain Active  metFORMIN  500 mg oral tablet 1 tab(s) orally 2 times a day Active   Lab Results:  Routine Micro:  28-Apr-16 02:00   Micro Text Report INFLUENZA A+B ANTIGENS   COMMENT                   NEGATIVE FOR INFLUENZA A (ANTIGEN ABSENT)   COMMENT                   NEGATIVE FOR INFLUENZA B (ANTIGEN ABSENT)   ANTIBIOTIC                       Comment 1.. NEGATIVE FOR INFLUENZA A (ANTIGEN ABSENT) A negative result does not exclude influenza. Correlation with clinical impression is required.  Comment 2.. NEGATIVE FOR INFLUENZA B (ANTIGEN ABSENT)  Result(s) reported on 09 Mar 2015 at 02:37AM.  Routine Chem:  28-Apr-16 02:08   Lipase 28 (22-51 NOTE: New Reference Range  01/17/15)  Glucose, Serum  218 (65-99 NOTE: New Reference Range  01/17/15)  BUN 18 (6-20 NOTE: New Reference Range  01/17/15)  Creatinine (comp) 0.85 (0.44-1.00 NOTE: New Reference Range  01/17/15)  Sodium, Serum  127 (135-145 NOTE: New Reference Range  01/17/15)  Potassium, Serum 4.1 (3.5-5.1 NOTE: New Reference Range  01/17/15)  Chloride, Serum  92 (101-111 NOTE: New Reference Range  01/17/15)  CO2, Serum 26 (22-32 NOTE: New Reference Range  01/17/15)  Calcium (Total), Serum 9.1 (8.9-10.3 NOTE: New Reference Range  01/17/15)  Anion Gap 9  eGFR (African American) >60  eGFR (Non-African American) >60 (eGFR values <84m/min/1.73 m2 may be an indication of chronic kidney disease (CKD). Calculated eGFR is useful in patients with stable renal function. The eGFR calculation will not be reliable in acutely ill patients when serum creatinine is changing rapidly. It is not useful in patients on dialysis. The eGFR calculation may not be applicable to patients at the low and high extremes of body sizes, pregnant women, and vegetarians.)  Cardiac:  27-Apr-16 22:15   Troponin I  0.04 (0.00-0.03 0.03 ng/mL or less: NEGATIVE  Repeat testing in 3-6 hrs  if clinically indicated. >0.05 ng/mL: POTENTIAL  MYOCARDIAL INJURY. Repeat   testing in 3-6 hrs if  clinically indicated. NOTE: An increase or decrease  of 30% or more on serial  testing suggests a  clinically important change NOTE: New Reference Range  01/17/15)  28-Apr-16 05:54   Troponin I 0.03 (0.00-0.03 0.03 ng/mL or less: NEGATIVE  Repeat testing in 3-6 hrs  if clinically indicated. >0.05 ng/mL: POTENTIAL  MYOCARDIAL INJURY. Repeat  testing in 3-6 hrs if  clinically indicated. NOTE: An increase or decrease  of 30% or more on  serial  testing suggests a  clinically important change NOTE: New Reference Range  01/17/15)  Routine UA:  27-Apr-16 22:35   Color (UA) Amber  Clarity (UA) Clear  Glucose (UA) 50 mg/dL  Bilirubin (UA) Negative  Ketones (UA) 1+  Specific Gravity (UA) 1.017  Blood (UA) Negative  pH (UA) 6.0  Protein (UA) 100 mg/dL  Nitrite (UA) Positive  Leukocyte Esterase (UA) Negative (Result(s) reported on 08 Mar 2015 at 10:58PM.)  RBC (UA) 0-5  WBC (UA) 6-30  Bacteria (UA) NONE SEEN  Epithelial Cells (UA) NONE SEEN  Result(s) reported on 08 Mar 2015 at 10:58PM.  Routine Hem:  28-Apr-16 02:08   WBC (CBC) 10.2  RBC (CBC) 4.10  Hemoglobin (CBC) 13.3  Hematocrit (CBC) 39.4  Platelet Count (CBC) 180  MCV 96  MCH 32.4  MCHC 33.8  RDW 13.2  Neutrophil % 88.0  Lymphocyte % 4.9  Monocyte % 6.9  Eosinophil % 0.0  Basophil % 0.2  Neutrophil #  9.0  Lymphocyte #  0.5  Monocyte # 0.7  Eosinophil # 0.0  Basophil # 0.0 (Result(s) reported on 09 Mar 2015 at 02:27AM.)   EKG:  EKG Interp. by me   Interpretation EKG shows a-fib with RVR, 122 bpm, inferolateral st depression, possible subendocardial injury   Radiology Results: XRay:    27-Apr-16 19:32, Chest Portable Single View  Chest Portable Single View   REASON FOR EXAM:    short of breath  COMMENTS:       PROCEDURE: DXR - DXR PORTABLE CHEST SINGLE VIEW  - Mar 08 2015  7:32PM     CLINICAL DATA:  Lethargy in vomiting.  Head injury yesterday.    EXAM:  PORTABLE CHEST  - 1 VIEW    COMPARISON:  Two-view chest x-ray 03/06/2015.    FINDINGS:  Heart is enlarged. Mild interstitial coarsening is similar to the  prior study. No focal airspace disease is evident. There is no edema  or effusion to suggest failure. Pacing wires are stable.     IMPRESSION:  1. Stable cardiomegaly without failure.  2. No acute cardiopulmonary disease or significant interval change.      Electronically Signed    By: San Morelle M.D.    On: 03/08/2015 19:49         Verified By: Resa Miner. MATTERN, M.D.,    Nitroglycerin: Other  Penicillin: Unknown  Sulfa drugs: Unknown  Vital Signs/Nurse's Notes: **Vital Signs.:   28-Apr-16 08:00  Celsius 99.4  Temperature Source oral  Pulse Pulse 78  Pulse source if not from Vital Sign Device per cardiac monitor  Respirations Respirations 18  Systolic BP Systolic BP 130  Diastolic BP (mmHg) Diastolic BP (mmHg) 69  Mean BP 85  Pulse Ox % Pulse Ox % 98  Pulse Ox Activity Level  At rest  Oxygen Delivery 2L; Nasal Cannula    Impression 79 year old female with history of chronic a-fib not on longterm anticoagulation 2/2 frequent falls s/p MDT PPM, prior intracranial hemorrhage 2/2 fall, hypertrophic CM, TIA, chronic diastolic CHF, pulmonary HTN, COPD on home O2 at 3L, HTN, HLD, DM2, anemia, and anxiety, depression who presented to [facility] on 4/27 after suffering a fall and hitting her head.   1. A-fib with RVR: -Likely in the setting of increased nausea and vomiting/dehydration, infection -Currently rate controlled in the 70s on diltizem gtt, had po diltiazem this AM -CT abdomen/pelvis planned for today -Not on long term full dose anticoagulation 2/2 frequent falls -Echo pending -----  can wean diltiazem infusion as tolerated (if tolerating po meds)  2. Sepsis/UTI: -IV abx -Culture pending -IV fluids, given poor po intake currently  3. Nausea/vomiting: -CT abdomen and pelvis as above -Lipase -WBC count  normal -Afebrile -Unclear etiology at this time, monitor  4. Encephalopathy: -Still lethargic, infection, hyponatremia -Contnue to cycle labs  5. Pulmonary HTN: -Echo as above  6. COPD: -On O2 at baseline -Nebs and steroids per IM  7. Hyponatremia, could do trial of IVF, NS closely monitor   Electronic Signatures: Rise Mu (PA-C)  (Signed 28-Apr-16 09:43)  Authored: General Aspect/Present Illness, History and Physical Exam, Review of System, Family & Social History, Past Medical History, Home Medications, Labs, EKG , Radiology, Allergies, Vital Signs/Nurse's Notes, Impression/Plan Ida Rogue (MD)  (Signed 28-Apr-16 09:55)  Authored: General Aspect/Present Illness, History and Physical Exam, Review of System, Family & Social History, Labs, EKG , Impression/Plan  Co-Signer: General Aspect/Present Illness, Home Medications, Allergies   Last Updated: 28-Apr-16 09:55 by Ida Rogue (MD)
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
## 2015-03-12 NOTE — H&P (Signed)
PATIENT NAME:  [name] [name], Khianna L MR#:  [id number] DATE OF BIRTH:  Aug 20, 1936  DATE OF ADMISSION:  03/08/2015  CHIEF COMPLAINT: Nausea and vomiting.   HISTORY OF PRESENT ILLNESS: This is a 79 year old female who presents from nursing facility for persistent nausea and vomiting today. She was evaluated here in the ED yesterday after a fall. She has a contusion on her right forehead. She was worked up here with scans yesterday, was found not to have any intracranial bleed or cervical neck complications from that fall. The patient is demented and is not able to provide a good history today. History is per SNF report for collateral information. Stated that the patient had a lot of nausea and vomiting today, and so they sent her to the ED for this. In the ED, the patient was found to be in atrial fibrillation with rapid ventricular response and so hospitalists were called for admission. She was given some Cardizem in the ED and this did not slow her heart rate sufficiently to control her rate and so she was put on a Cardizem drip and we were consulted for admission.   PRIMARY CARE PHYSICIAN: Nonlocal.   PAST MEDICAL HISTORY: Depression, osteoarthritis, COPD, on 2 liters home oxygen. GERD, CHF, diabetes mellitus, hypertension, dementia, brain hemorrhage in the past, frequent falls.   CURRENT MEDICATIONS: Trazodone 12.5 mg q. 8 p.r.n. for agitation as well as 25 mg at bedtime p.r.n. for sleep; sertraline 50 mg daily; oxybutynin per 24-hour capsule 15 mg daily; metformin 500 mg b.i.d.; Tylenol p.r.n.; losartan 50 mg daily; Colace p.r.n.; Klor-Con daily; diltiazem 240 mg extended release daily; buspirone 10 mg b.i.d.; Breo Ellipta 120/25 one puff daily; Norco 5/325 mg 1 to 2 tablets q. 4 hours p.r.n. pain.   PAST SURGICAL HISTORY: Appendectomy, tonsillectomy, partial hysterectomy, pacemaker.   ALLERGIES: NITROGLYCERIN, PENICILLIN, SULFA.   FAMILY HISTORY: Unable to obtain due to patient condition.   SOCIAL  HISTORY: Unable to obtain due to patient condition.   REVIEW OF SYSTEMS: Unable to obtain due to the patient's dementia. The patient is alert, but not oriented.   PHYSICAL EXAMINATION:  VITAL SIGNS: Blood pressure 180/86, pulse 127, temperature 98.8, respirations 28, saturation 96%  on 2 liters O2.  GENERAL: This is a thin, elderly woman lying in bed in no acute distress.  HEENT: Pupils equal, round, and reactive to light and accommodation. Extraocular movements intact. No scleral icterus. Moist mucosal membranes.  NECK: Thyroid is not enlarged. Neck is supple. No masses, nontender. No cervical adenopathy. No JVD.  RESPIRATORY: She has coarse bilateral breath sounds. She is not in respiratory distress. She does not have any wheezing or rales. She has good air movement.  CARDIOVASCULAR: Tachycardic with an irregular rate. No murmurs, rubs, or gallops. Good pedal pulses with no lower extremity edema.  ABDOMEN: Soft, nontender, nondistended with good bowel sounds.  MUSCULOSKELETAL: Difficult to assess, as the patient does not follow commands; however, she has full spontaneous range of motion throughout all extremities without cyanosis or clubbing.  SKIN: No rash or lesions noted. Skin is warm, dry, and intact.  LYMPHATIC: No adenopathy.  NEUROLOGICAL: Difficult to assess, as the patient is not following commands. Cranial nerves seem to be intact. Sensation seems to be intact. No dysarthria or aphasia.  PSYCHIATRIC: Alert, not oriented, moderately cooperative, though confused, not agitated.   LABORATORY DATA: White count 8.1, hemoglobin 14, hematocrit 41.5, platelets 177,000. Sodium 128, potassium 4, chloride 93, bicarbonate 25, BUN 16, creatinine 0.67, glucose 182, calcium  9.4, phosphorus 3.1, magnesium 1.6. Total protein 7.9, albumin 4.6, total bilirubin 0.9, alkaline phosphatase 70, AST 26, ALT 11. Troponin less than 0.03. UA is pending.   RADIOLOGY: Chest x-ray: Stable cardiomegaly without  failure. No acute cardiopulmonary disease or significant interval change, mild interstitial coarsening similar to prior study. Prior study had mentioned chronic reticular markings lower lungs, likely interstitial lung disease.   ASSESSMENT AND PLAN:  1.  Atrial fibrillation with rapid ventricular response. Question as to whether or not this was new onset or not, as the patient was already on Cardizem outpatient; however, we will trend her cardiac enzymes, keep her on the diltiazem drip for rate control, get an echocardiogram and a cardiology consult in the morning.  2.  Hyponatremia. This seems to be acute, potentially due to the patient's nausea and vomiting. This is a significant change from yesterday. We will hydrate her with some fluids and monitor her for improvement.  3.  Hypertension: Hopefully this will be controlled with the patient's diltiazem drip. We will continue her home medications as well. We can do additional intravenous p.r.n. medications if we need to.  4.  Diabetes mellitus. We will continue the patient's home metformin and sliding scale insulin while she is here in the hospital.  5.  Chronic obstructive pulmonary disease. The patient got steroids and DuoNebs here in the ED. We will continue her home oxygen at 2 liters. Some question of interstitial lung disease on imaging, she will need a workup for this outpatient with her PCP. Her breathing is much improved since being treated in the ED.  6.  Dementia with some behavioral changes. We will continue some of her home medications in order for her behavior stabilization. The patient is not currently agitated.  7.  Deep venous thrombosis prophylaxis. Subcutaneous Lovenox.   CODE STATUS: Will need to be clarified once family or someone from the nursing facility can be contacted, and attempts were made to contact for clarification on this without success.   TIME SPENT ON THIS ADMISSION: Fifty minutes.     ____________________________ Candace Cruiseavid F. Anne HahnWillis, MD dfw:TM D: 03/09/2015 01:38:05 ET T: 03/09/2015 02:06:09 ET JOB#: [id number]  cc: Candace Cruiseavid F. Anne HahnWillis, MD, <Dictator> Rayven Rettig Scotty CourtF Geanna Divirgilio MD ELECTRONICALLY SIGNED 03/09/2015 2:59

## 2015-03-13 NOTE — Discharge Summary (Signed)
PATIENT NAME:  [name] [name], [name] [name] MR#:  [id number] DATE OF BIRTH:  [birth year]  DATE OF ADMISSION:  03/08/2015 DATE OF DISCHARGE:  03/11/2015  DISCHARGE DIAGNOSES: 1. Urinary tract infection and sepsis.  2. Atrial fibrillation on rapid ventricular response.  3. Acute bronchitis.   CONDITION ON DISCHARGE:  Stable.   MEDICATIONS ON DISCHARGE:  1. Buspirone 10 mg oral 2 times a day.  2. Breo 1 puff inhalation once a day.  3. Trazodone 50 mg oral take 1/2 tablet at night.  4. Sertraline 50 mg oral once a day.  5. Bupropion 150 twenty-four hour oral extended release tablet 3 tablets orally once a day in the morning.  6. Cardizem 240 mg 24 hour extended release once a day in the morning.  7. Potassium chloride 20 mEq extended release once a day.  8. Losartan 50 mg oral once a day.  9. Oxybutynin 15 mg extended release once a day.  10. Docusate sodium 100 mg oral capsule 2 times a day.  11. Acetaminophen and hydrocodone 325 plus 5 mg oral tablet 4 times a day as needed.  12. Prednisone 10 mg tablets, start at 60 and taper x10 mg daily until complete.  13. Advair Diskus 1 puff inhalation 2 times a day.  14. Ceftin 250 mg oral tablet 2 times a day for 3 days.  15. Benzocaine and menthol lozenges every 2 hours as needed for cough and sore throat.   DIET ON DISCHARGE: Low sodium.   FOLLOWUP:  Advised to follow in 1-2 weeks in Cardiology Clinic.    HISTORY OF PRESENT ILLNESS: A 79 year old female  presented from nursing facility with persistent nausea and vomiting. She was in the Emergency Room a day before for the fall.  She had a scan which was without any acute findings, so she was sent back and this admission sent with nausea and vomiting. She was also found to have rapid atrial fibrillation and so she was admitted with Cardizem drip, initially in CCU. Heart rate came under control. She was found to have urinary tract infection, so in the hospital course she was treated for that and her heart rate  came under control after that, so Cardizem IV drip was switched to Cardizem oral which she was already taking before, and she remained stable after that.   OTHER ISSUES IN THE HOSPITAL:  1. Hyponatremia. She was given IV fluid and it recovered.  2. Hypertension and she remained under control with home medications.  3. Diabetes. Continue metformin and sliding scale coverage.  4. COPD exacerbation and some acute bronchitis. She was on steroids, DuoNeb,  2 liters oxygen at home. We continued in the hospital and then tapering steroid on discharge.   CONSULTS IN THE HOSPITAL: Cardiology with Julien Nordmann, MD.  IMPORTANT LABORATORY/IMAGING RESULTS IN THE HOSPITAL:  1. WBC 4.7, hemoglobin 13.3, platelet count is 177,000, MCV is 98.   2. BUN 14, creatine 0.77, sodium 137, potassium 3.8, chloride 102, and CO2 is 28.  3. CT cervical spine:  Soft tissue contusion over right frontal bone negative for acute intracranial abnormality.  4. Sodium level was 128 on April 27.  5. Magnesium 1.6.  6. Chest x-ray, portable, single view, showed stable cardiomegaly without failure.  No acute cardiopulmonary disease  7. Urinalysis was having 30 WBCs and positive nitrate.  8. Blood culture negative.  9. No influenza reported on testing.  10. Urine culture reported colonies is too small to report.  11. Echocardiogram showed ejection fraction  60-65, normal global left ventricular systolic function, moderate left ventricular hypertrophy, normal right ventricular size and systolic function.  12. CT abdomen and pelvis with contrast showed moderate constipation, negative for bowel obstruction, and mild bibasilar atelectasis.  13. Sodium level came to 135 on April 29.   TOTAL TIME SPENT ON THIS DISCHARGE:  Is 40 minutes.    ____________________________ Hope PigeonVaibhavkumar G. Elisabeth PigeonVachhani, MD vgv:tr D: 03/11/2015 08:20:00 ET T: 03/11/2015 16:37:24 ET JOB#: [id number]  cc: Hope PigeonVaibhavkumar G. Elisabeth PigeonVachhani, MD, <Dictator> Antonieta Ibaimothy J.  Gollan, MD Altamese DillingVAIBHAVKUMAR Raegan Sipp MD ELECTRONICALLY SIGNED 03/12/2015 21:46

## 2015-03-31 IMAGING — CT CT ORBITS W/O CM
6 of 8 series · 19 of 47 positions shown, 20 images · non-contrast
Comparison: CT maxillofacial 10/27/2012

CLINICAL DATA: Fall with injury to left side orbit.  Pain.

EXAM:
CT ORBITS WITHOUT CONTRAST
TECHNIQUE: Multidetector CT imaging of the orbits was performed following the
standard protocol without intravenous contrast.

[Series 2: head w/o · axial · non-contrast · 0.42mm/px · z∈[+248,+338]mm · 4 of 32 slices shown, 5 images]
[im 7/32  brain]
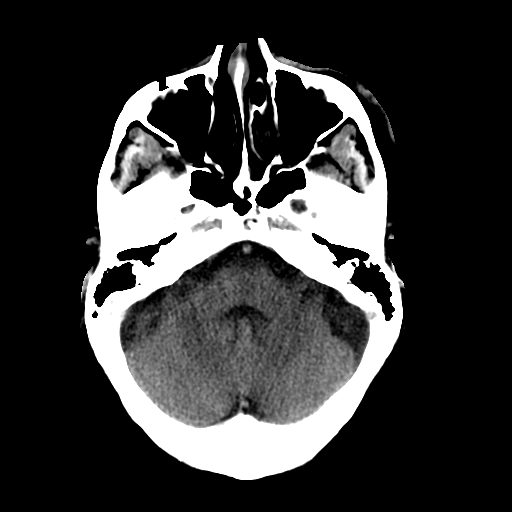
[im 7/32  bone]
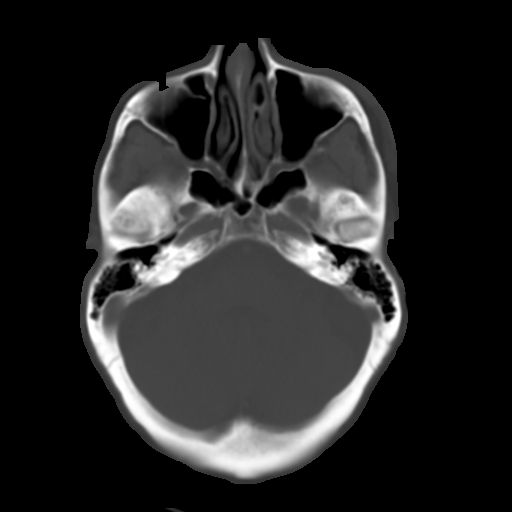
[im 13/32  bone]
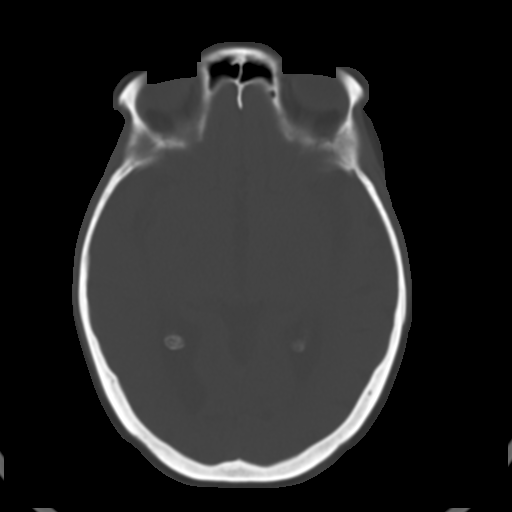
[im 19/32  bone]
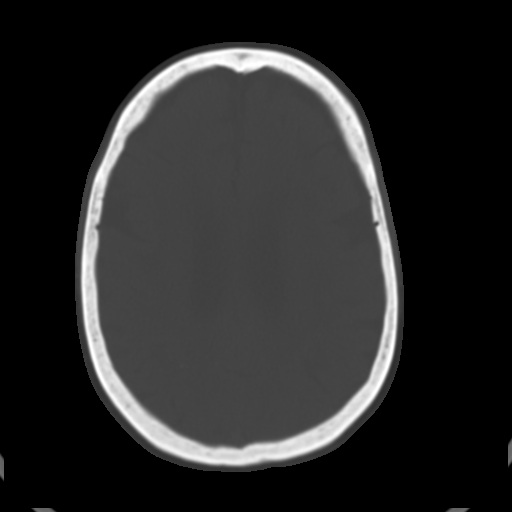
[im 25/32  bone]
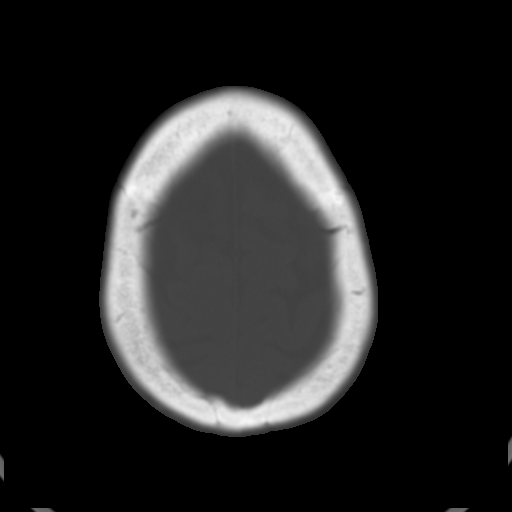

[Series 3: head w/o bone · axial · non-contrast · 0.42mm/px · z∈[+253,+333]mm · 3 of 32 slices shown]
[im 8/32  bone]
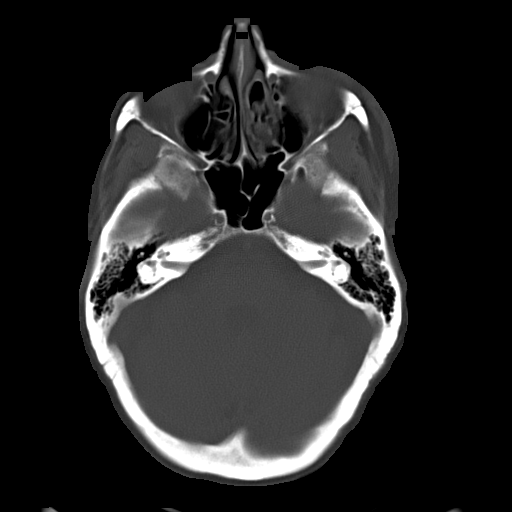
[im 16/32  bone]
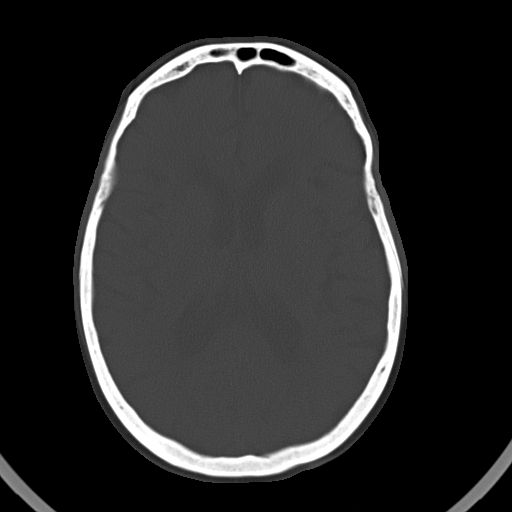
[im 24/32  bone]
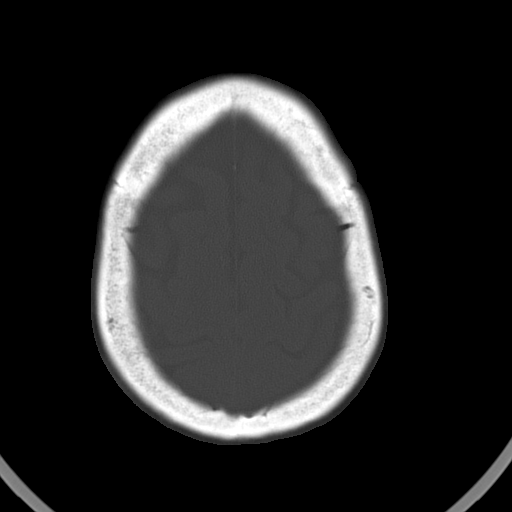

[Series 5: soft tissue · axial · 0.36mm/px · z∈[+112,+154]mm · 3 of 85 slices shown]
[im 8/85  brain]
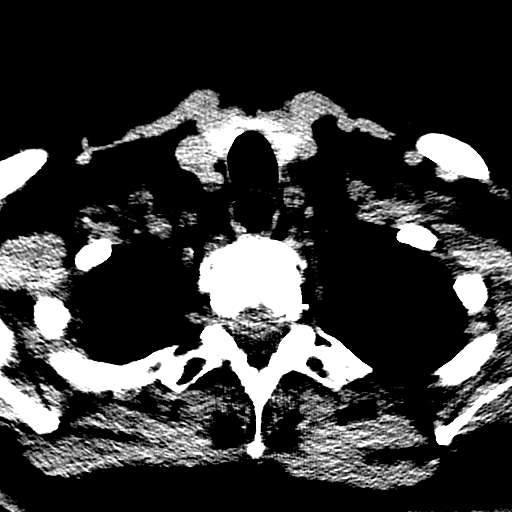
[im 22/85  brain]
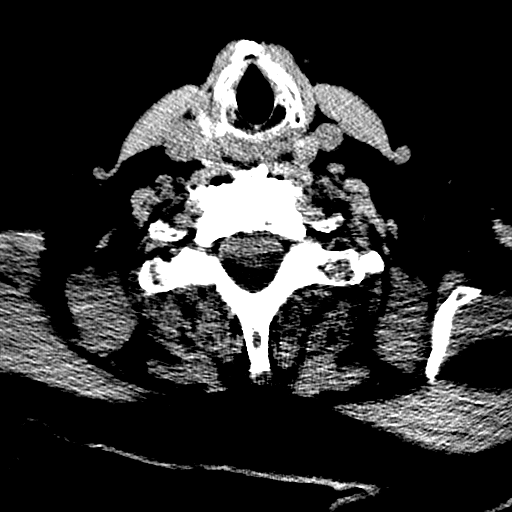
[im 29/85  brain]
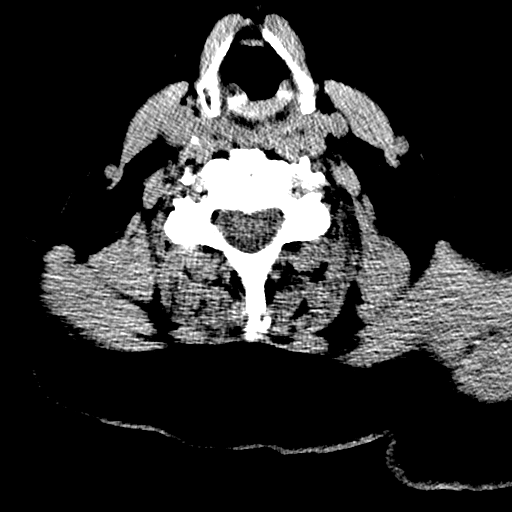

[Series 7: orbits bone · axial · 0.43mm/px · z∈[+248,+296]mm · 4 of 40 slices shown]
[im 8/40  bone]
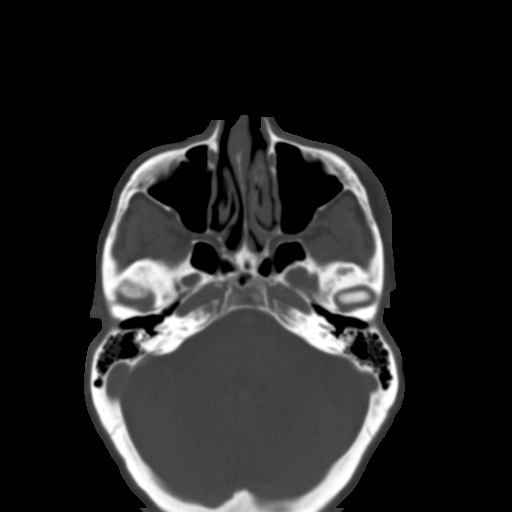
[im 16/40  bone]
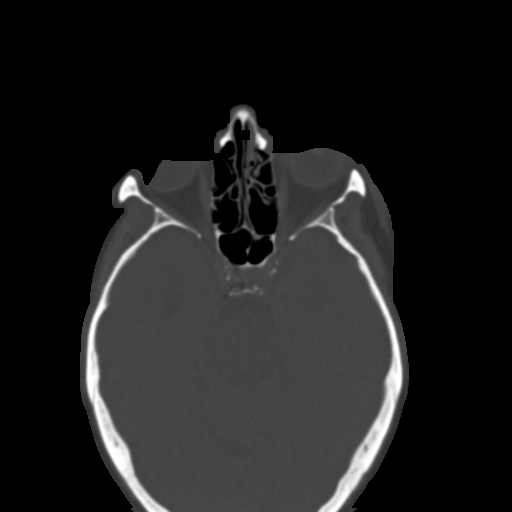
[im 24/40  bone]
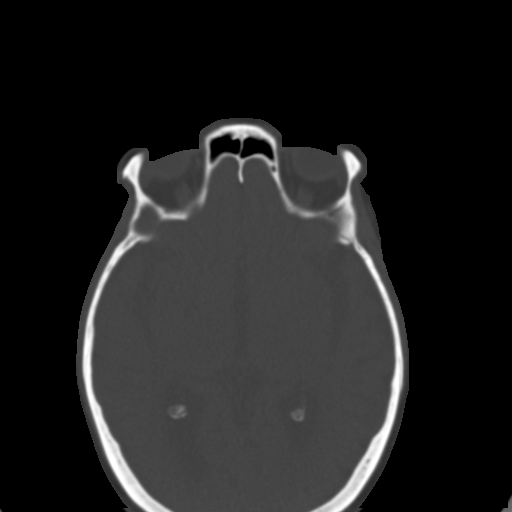
[im 32/40  bone]
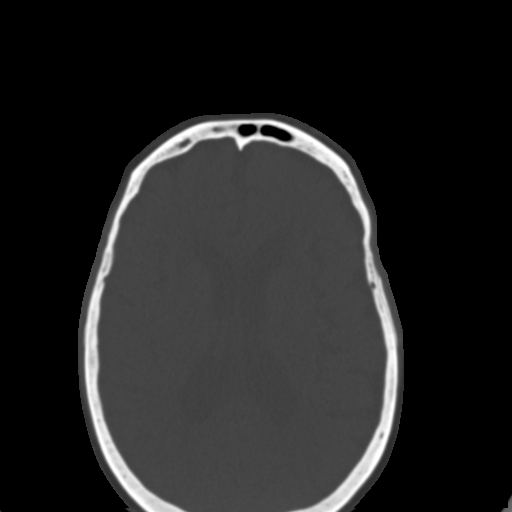

[cor · coronal · 0.36mm/px · 3 of 63 slices shown]
[im 13/63  bone]
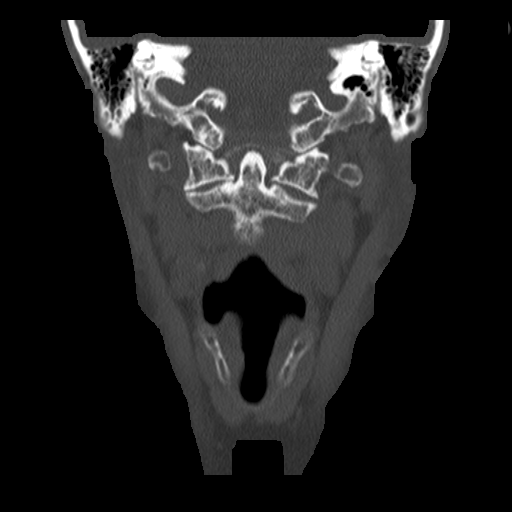
[im 25/63  bone]
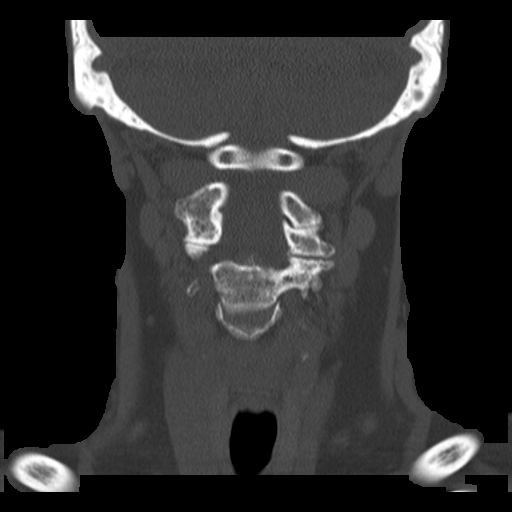
[im 38/63  bone]
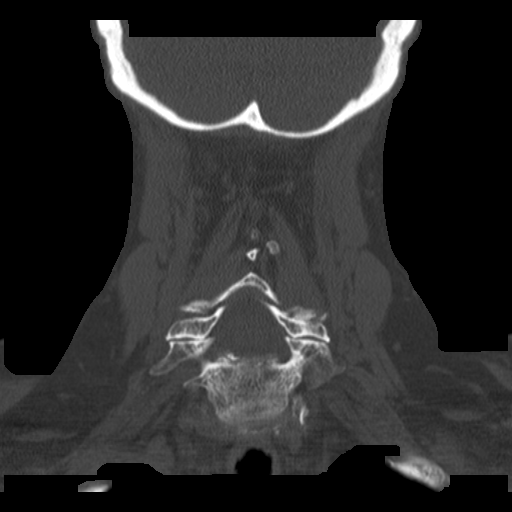

[sag · sagittal · 0.43mm/px · 2 of 73 slices shown]
[im 25/73  bone]
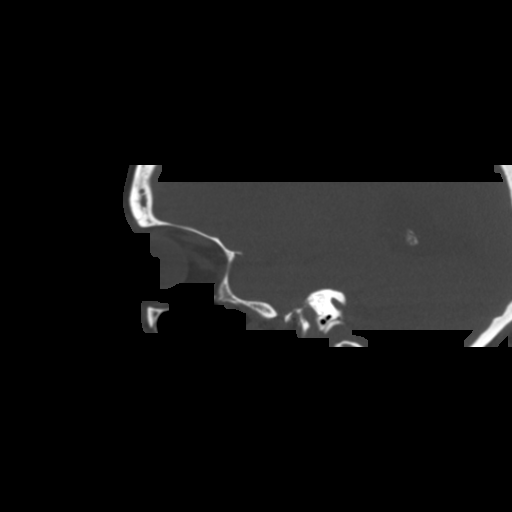
[im 49/73  bone]
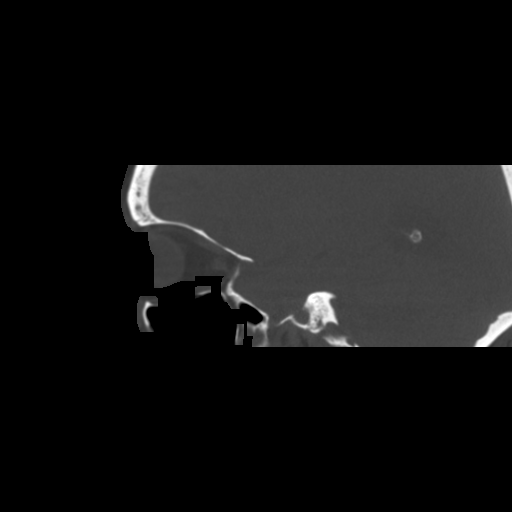

[19 of 47 positions shown; findings below may reference images not displayed]

FINDINGS: No displaced fracture seen. Lucency in the floor of the left orbit
was likely present on the previous study. Additionally, there is
hemosinus to suggest acute fracture. Normal appearing globes. No
retrobulbar hemorrhage. Nodular mucosal thickening in the bilateral
nasal cavity.
IMPRESSION: Left periorbital contusion. No acute fracture or intra-orbital
injury.

## 2015-04-11 IMAGING — CT CT T SPINE W/O CM
5 of 7 series · 12 of 33 positions shown, 14 images · non-contrast
Comparison: CT scan 08/09/2013.

CLINICAL DATA: Back pain.  History of prior compression fractures.

EXAM:
CT THORACIC SPINE WITHOUT CONTRAST
TECHNIQUE: Multidetector CT imaging of the thoracic spine was performed without
intravenous contrast administration. Multiplanar CT image
reconstructions were also generated.

[Series 3: t spine bone · axial · 0.27mm/px · z∈[-240,-138]mm · 2 of 125 slices shown, 3 images]
[im 42/125  soft-tissue]
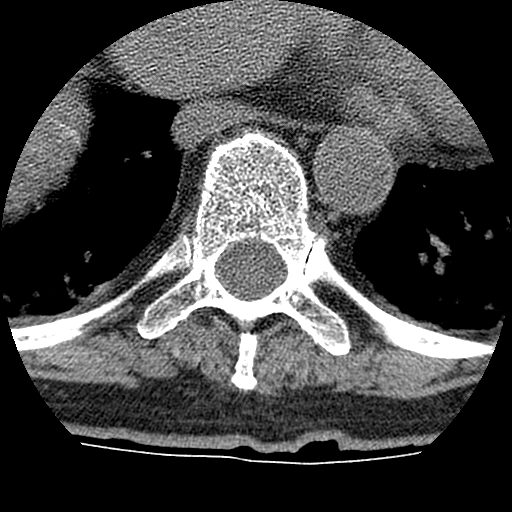
[im 42/125  bone]
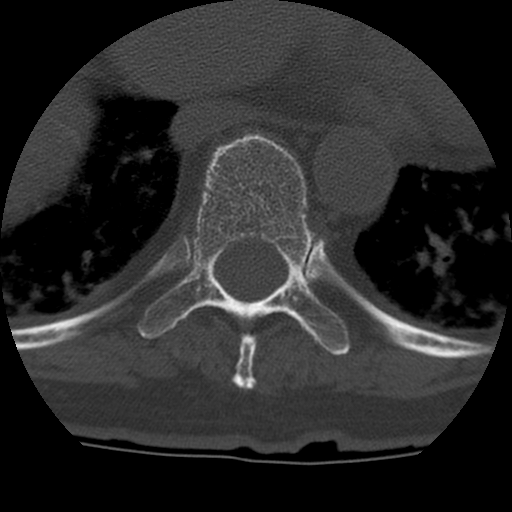
[im 83/125  bone]
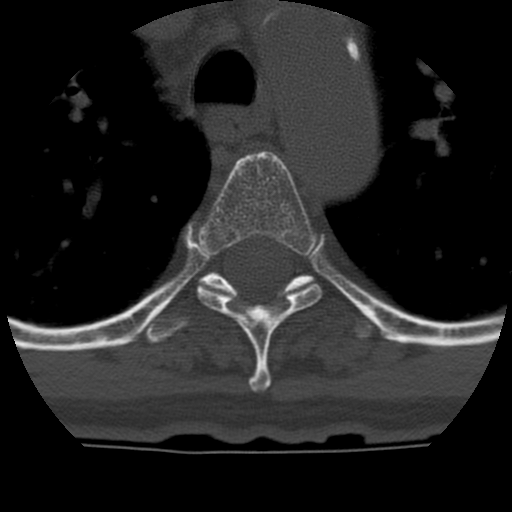

[Series 4: t spine soft · axial · 0.27mm/px · z∈[-240,-138]mm · 2 of 125 slices shown]
[im 42/125  soft-tissue]
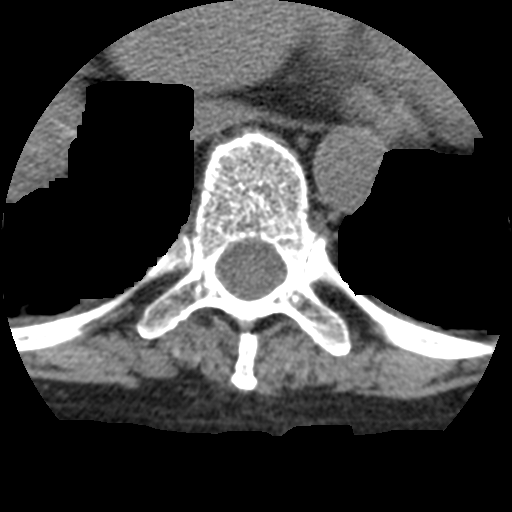
[im 83/125  soft-tissue]
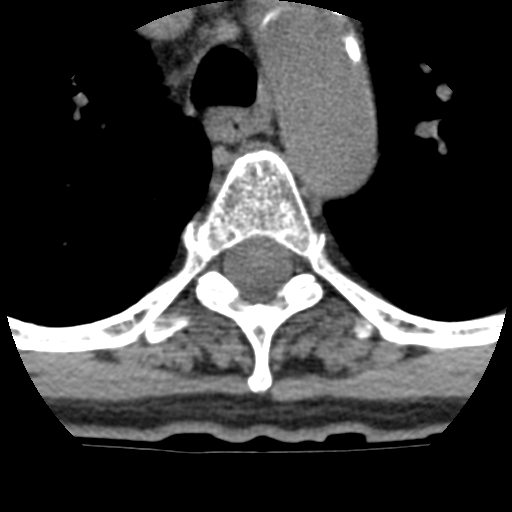

[Series 200: cor upper · coronal · 0.63mm/px · 1 of 51 slices shown]
[im 26/51  bone]
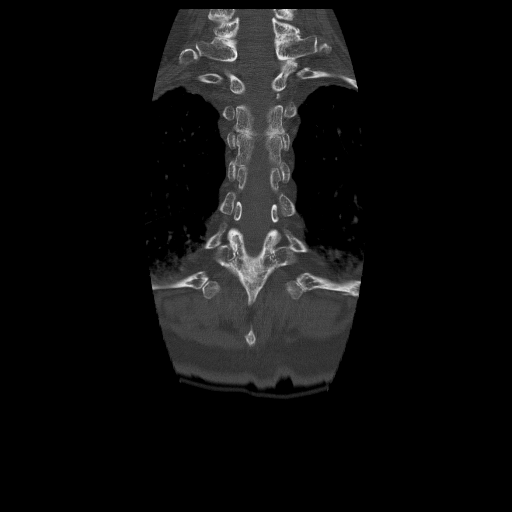

[Series 202: sag · sagittal · 0.63mm/px · 5 of 53 slices shown, 6 images]
[im 18/53  bone]
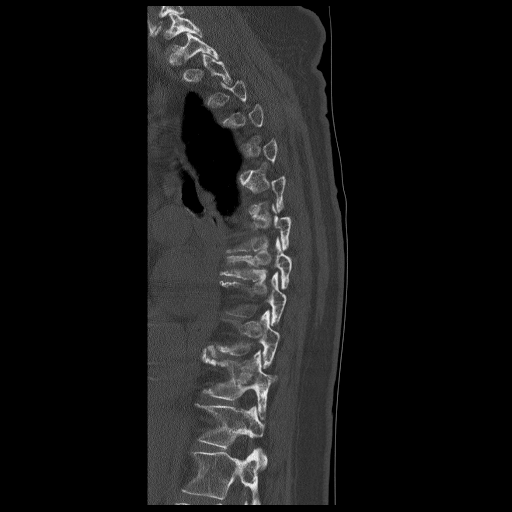
[im 22/53  bone]
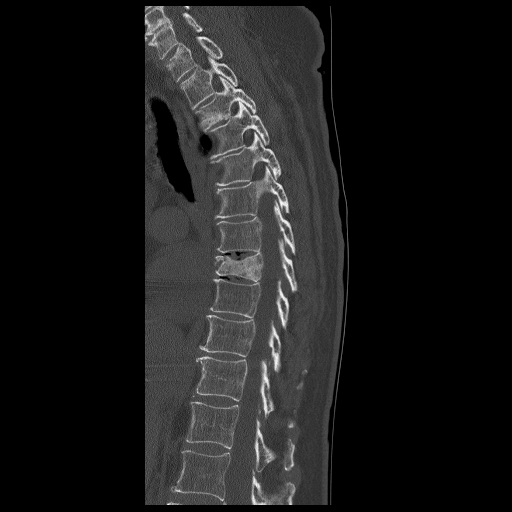
[im 27/53  soft-tissue]
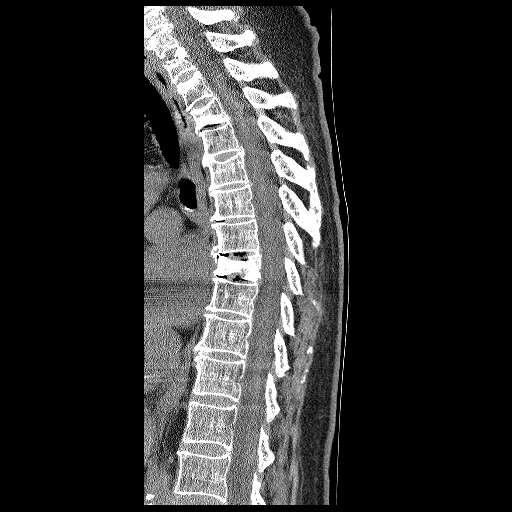
[im 27/53  bone]
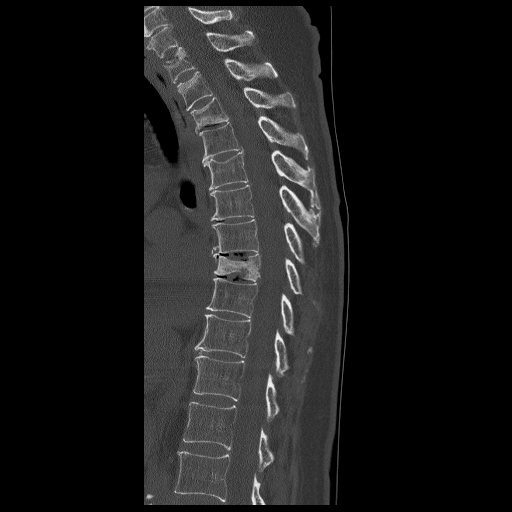
[im 31/53  bone]
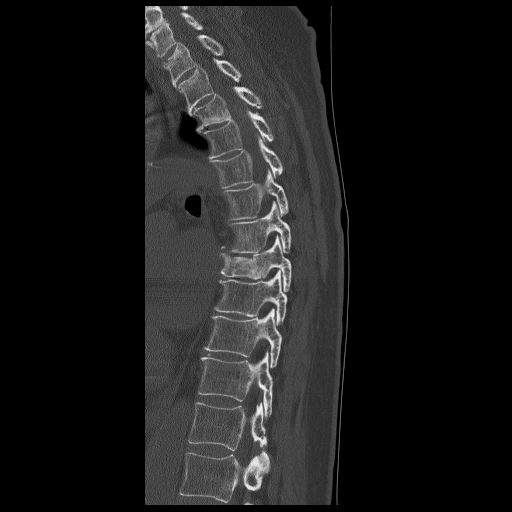
[im 35/53  bone]
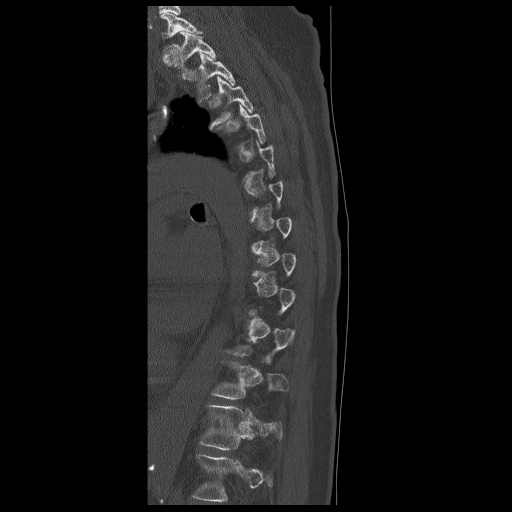

[Series 204: axial lower · axial · 0.29mm/px · z∈[-260,-181]mm · 2 of 121 slices shown]
[im 41/121  bone]
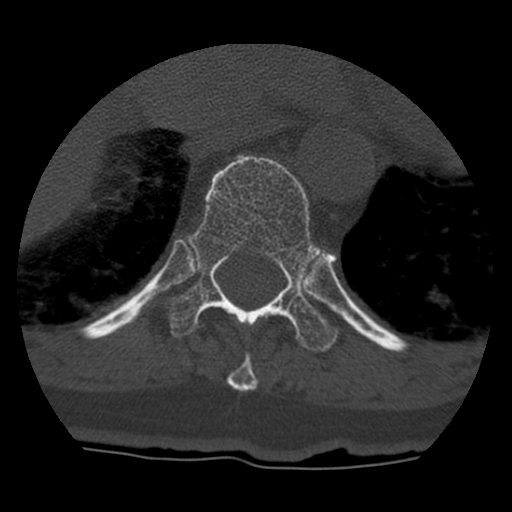
[im 81/121  bone]
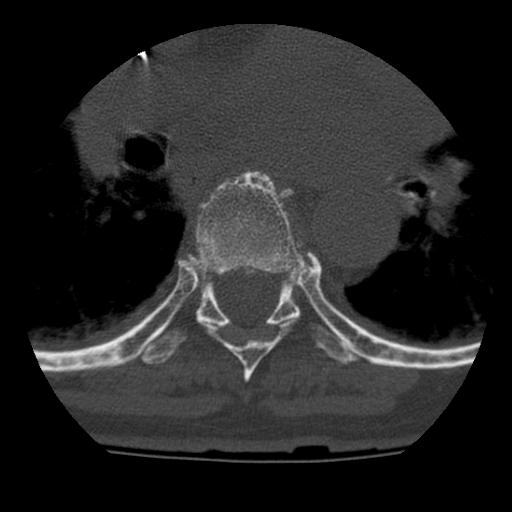

[12 of 33 positions shown; findings below may reference images not displayed]

FINDINGS: Slight further compression of the T3 vertebral body fracture but
healing changes with bony ingrowth and sclerosis. No retropulsion.

Slight further compression of the T8 fracture but healing changes
with bony ingrowth an sclerotic changes. No retropulsion. Vacuum
disc phenomenon noted at T7-8 and T8-9.

New superior endplate fractures at T1 and T2 since the prior CT scan
but no retropulsion or significant compression. The remaining
thoracic vertebral bodies are intact.

The spinal canal is generous. There is no spinal or foraminal
stenosis. The facets are normally aligned. No facet fractures.

Stable scarring changes in the lungs and atherosclerotic changes
involving the aorta.
IMPRESSION: 1. Further compression of the T3 and T8 vertebral body fractures but
evidence of healing changes. No retropulsion or canal compromise.
2. New superior endplate fractures of T1 and T2 without significant
compression or retropulsion.

## 2015-05-17 IMAGING — CT CT T SPINE W/O CM
4 of 6 series · 16 of 33 positions shown, 18 images · non-contrast
Comparison: 10/12/2013

CLINICAL DATA: Follow

EXAM:
CT THORACIC SPINE WITHOUT CONTRAST
TECHNIQUE: Multidetector CT imaging of the thoracic spine was performed without
intravenous contrast administration. Multiplanar CT image
reconstructions were also generated.

[Series 5: soft tissue · axial · 0.42mm/px · z∈[+348,+532]mm · 5 of 140 slices shown]
[im 24/140  soft-tissue]
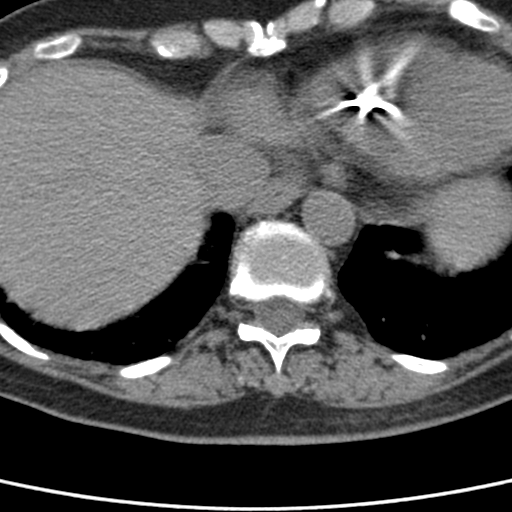
[im 47/140  soft-tissue]
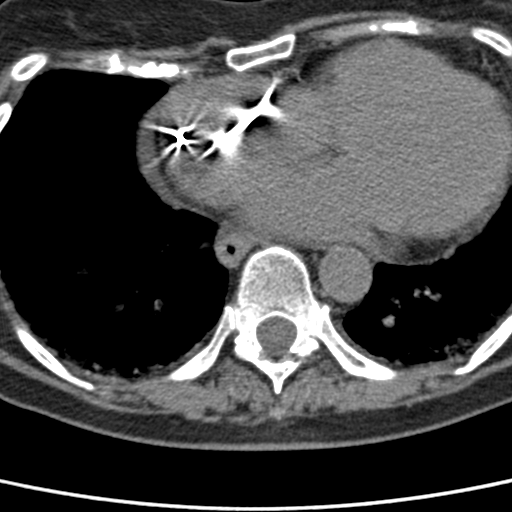
[im 70/140  soft-tissue]
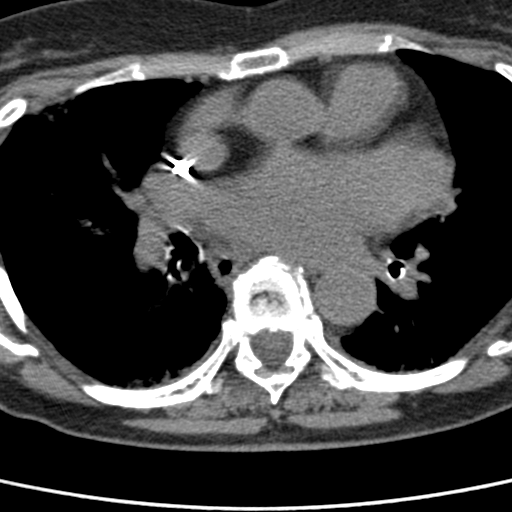
[im 93/140  soft-tissue]
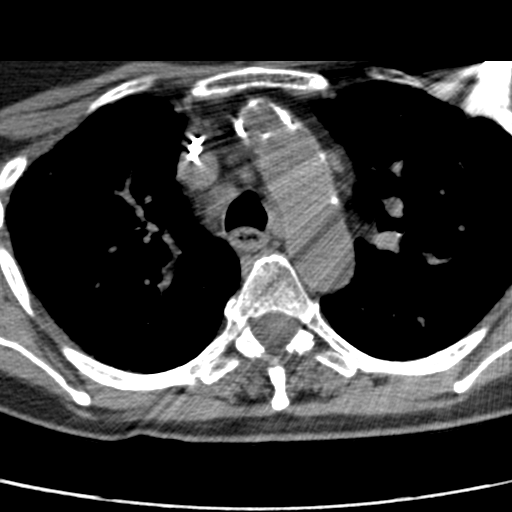
[im 116/140  soft-tissue]
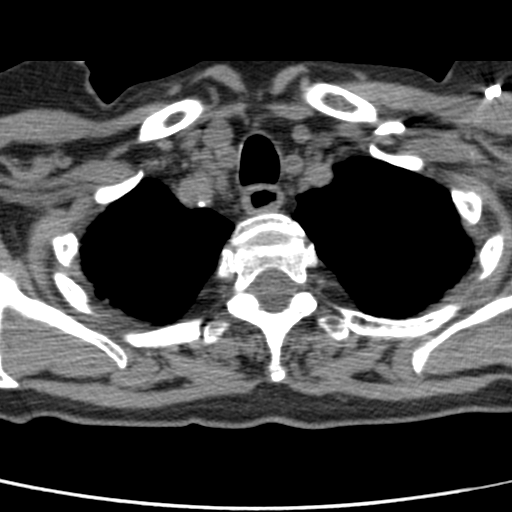

[cor · coronal · 0.54mm/px · 3 of 62 slices shown]
[im 13/62  bone]
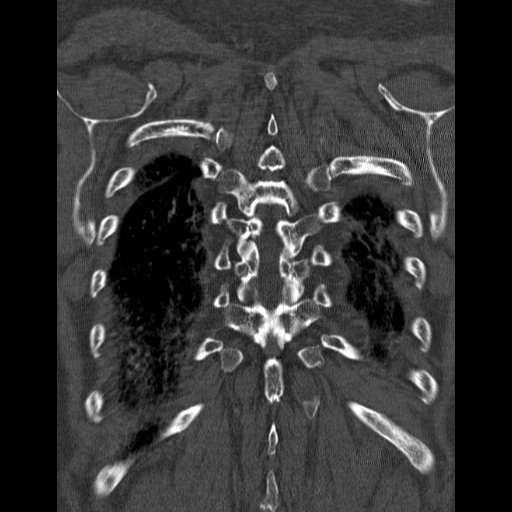
[im 25/62  bone]
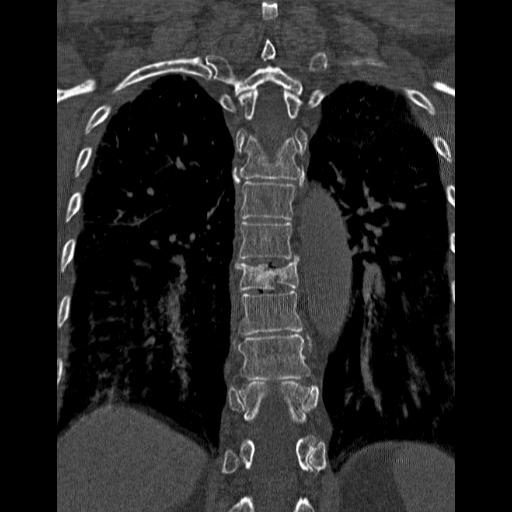
[im 37/62  bone]
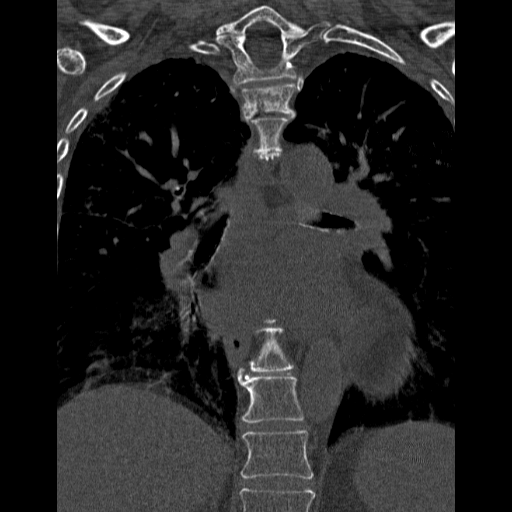

[sag · sagittal · 0.54mm/px · 5 of 43 slices shown, 6 images]
[im 15/43  bone]
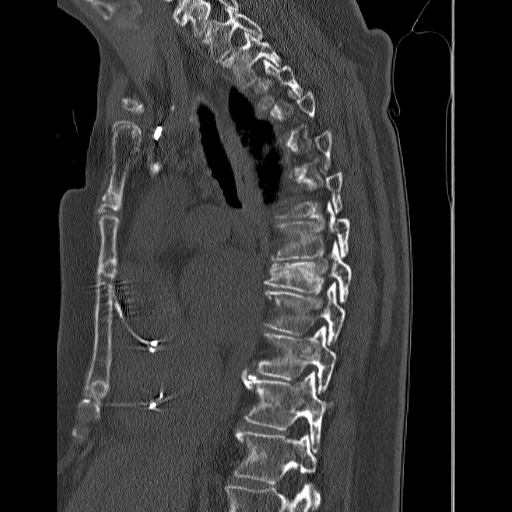
[im 18/43  bone]
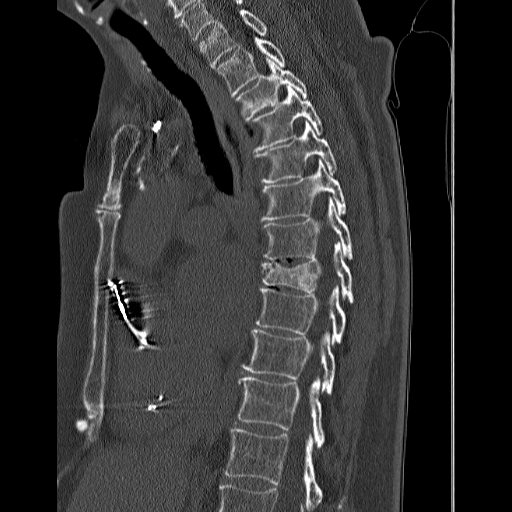
[im 22/43  soft-tissue]
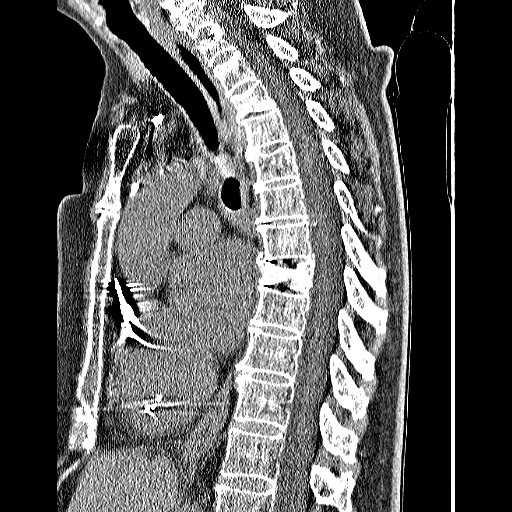
[im 22/43  bone]
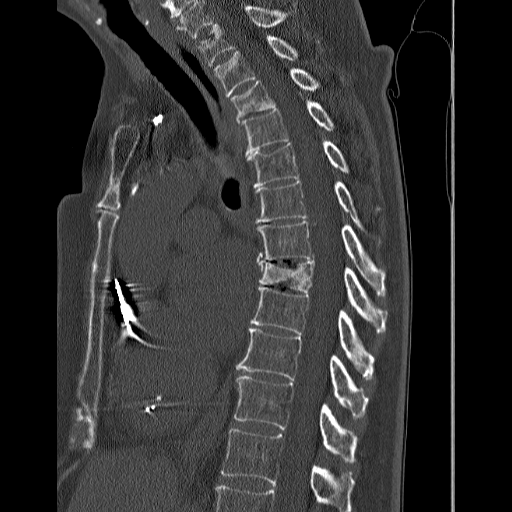
[im 25/43  bone]
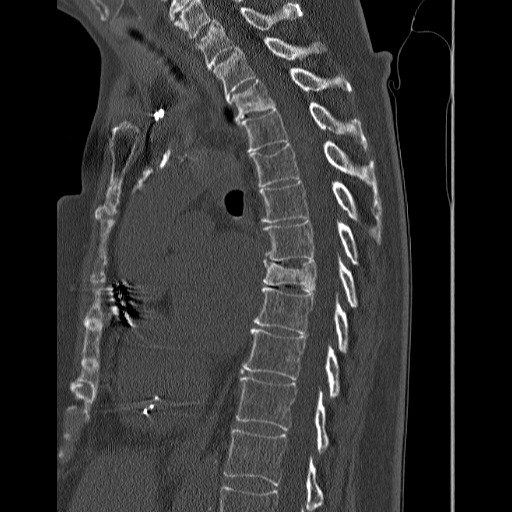
[im 29/43  bone]
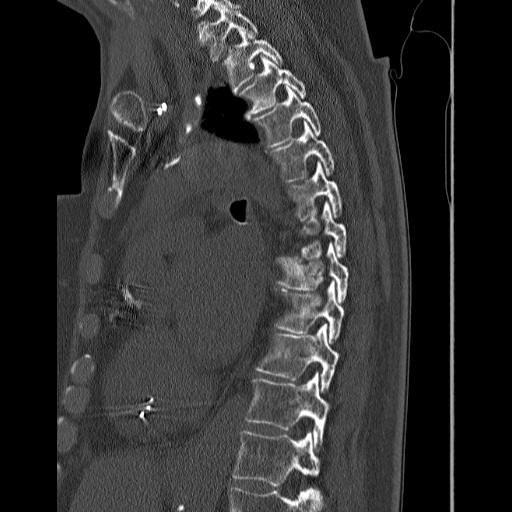

[axial 3 · axial · 0.42mm/px · z∈[+310,+458]mm · 3 of 75 slices shown, 4 images]
[im 1/75  soft-tissue]
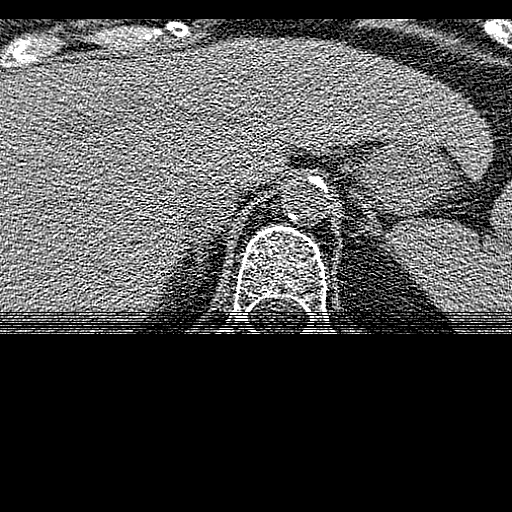
[im 1/75  bone]
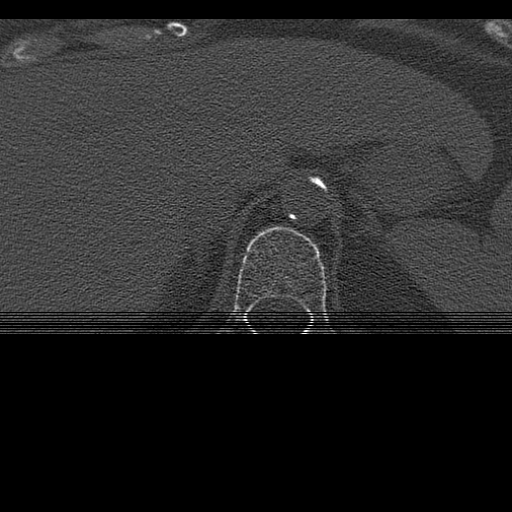
[im 38/75  bone]
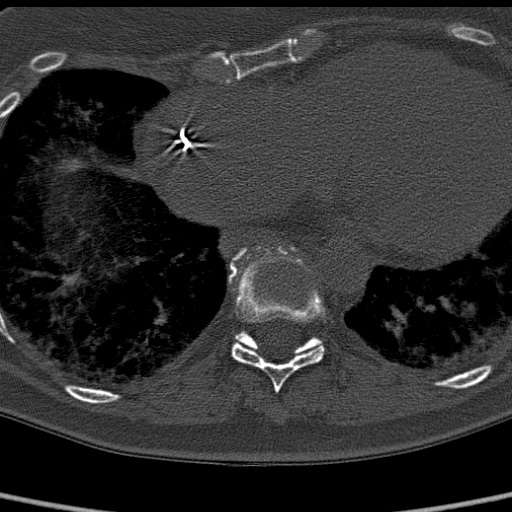
[im 75/75  bone]
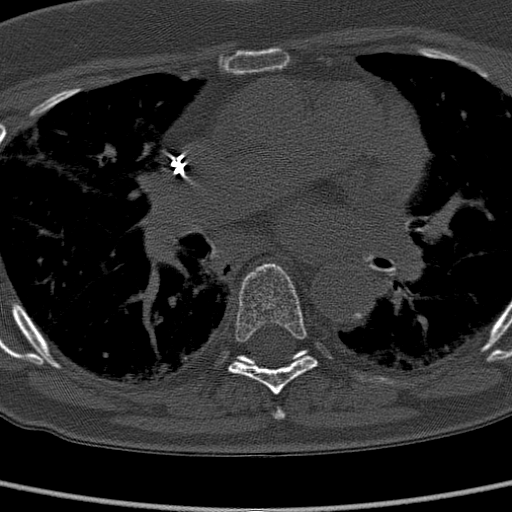

[16 of 33 positions shown; findings below may reference images not displayed]

FINDINGS: Stable T1, T2, T3, and T8 compression fractures with sclerotic
changes. Stable chronic superior depression and T5. No new
compression fracture. Sclerotic changes in the inferior manubrium
likely represent a prior fracture with evidence of healing.

Chronic changes in the lung parenchyma are stable. Stable appearance
of the heart with marked enlargement of the left atrium and left
ventricle.
IMPRESSION: Stable T1, T2, T3, and T8 compression fractures with sclerotic
changes.

There is sclerosis within the manubrium suggesting prior injury with
evidence healing. It is a nonspecific finding.

## 2015-05-17 IMAGING — CT CT HEAD W/O CM
3 of 6 series · 15 of 47 positions shown, 18 images · non-contrast
Comparison: 10/01/2013

CLINICAL DATA: Fall.  Back bruising

EXAM:
CT HEAD WITHOUT CONTRAST
CT CERVICAL SPINE WITHOUT CONTRAST
TECHNIQUE: Multidetector CT imaging of the head and cervical spine was
performed following the standard protocol without intravenous
contrast. Multiplanar CT image reconstructions of the cervical spine
were also generated.

[mpr, sagittal, sagittal · sagittal · 0.53mm/px · 3 of 44 slices shown]
[im 15/44  brain]
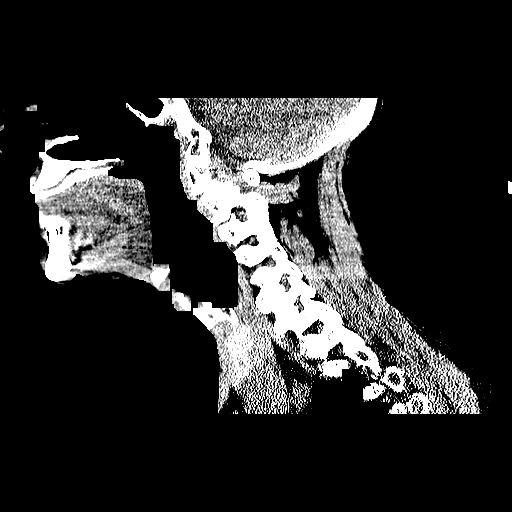
[im 22/44  brain]
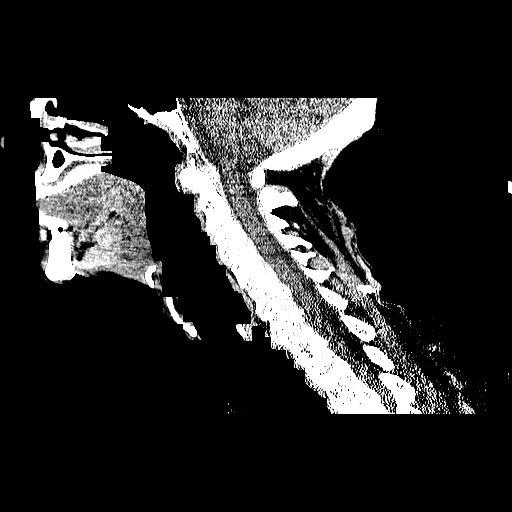
[im 29/44  brain]
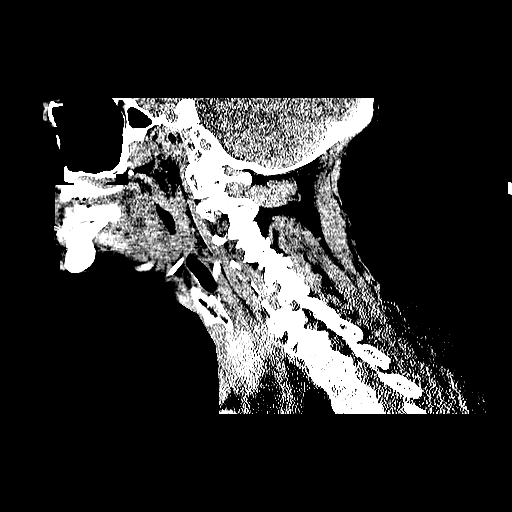

[mpr, coronal, coronal · coronal · 0.53mm/px · 3 of 37 slices shown]
[im 13/37  brain]
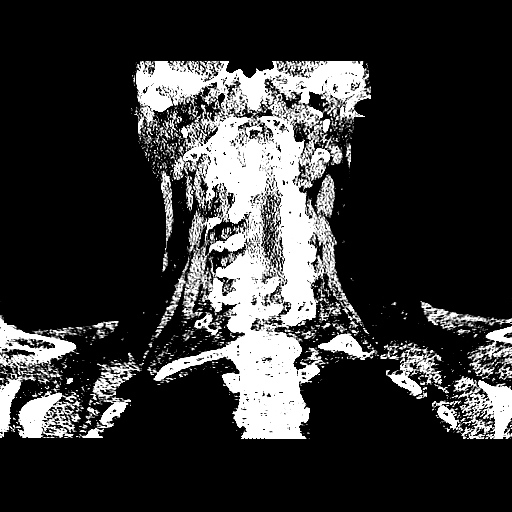
[im 17/37  brain]
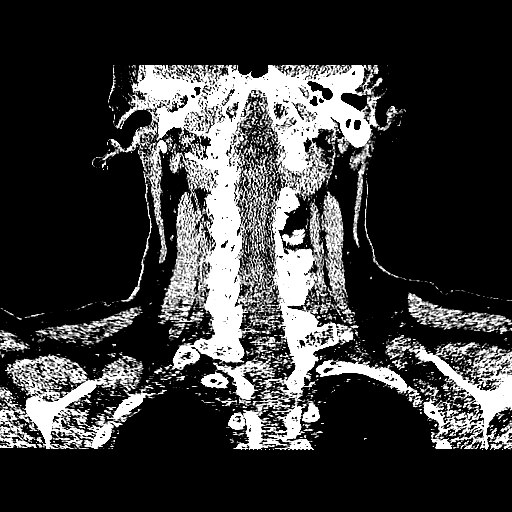
[im 21/37  brain]
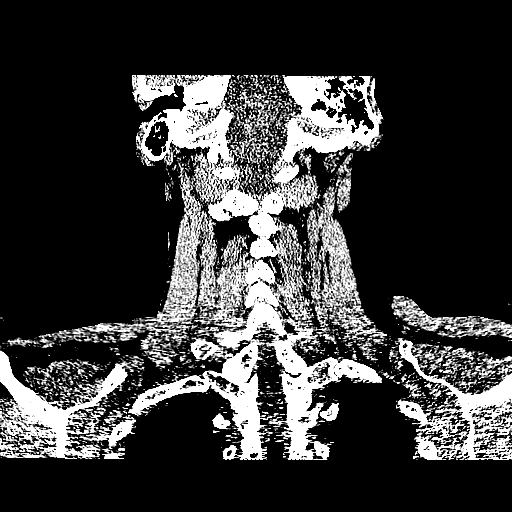

[mpr, orthogonals · axial · 0.53mm/px · z∈[+24,+160]mm · 9 of 100 slices shown, 12 images]
[im 10/100  brain]
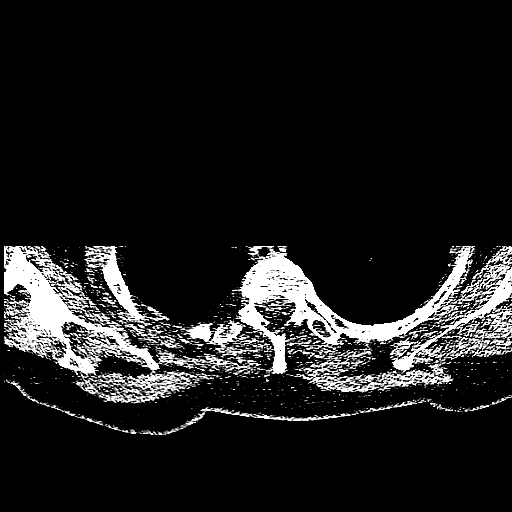
[im 10/100  bone]
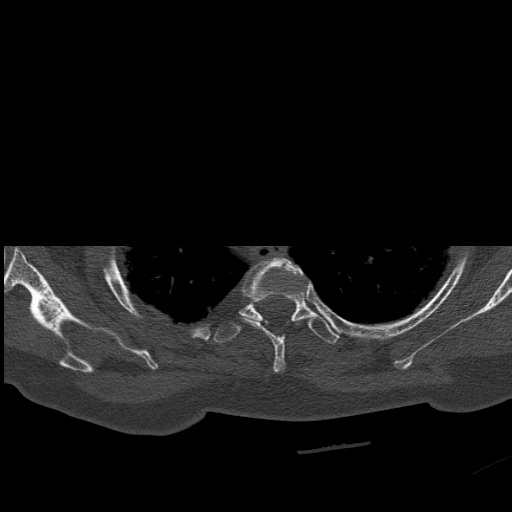
[im 20/100  brain]
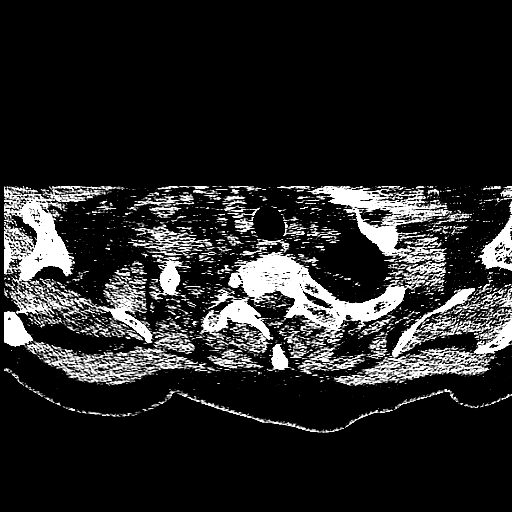
[im 30/100  brain]
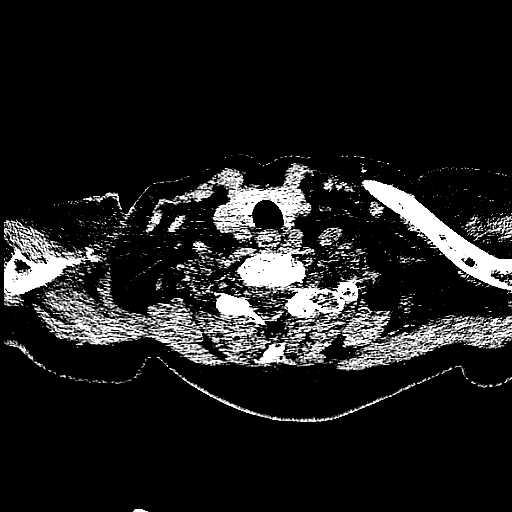
[im 40/100  brain]
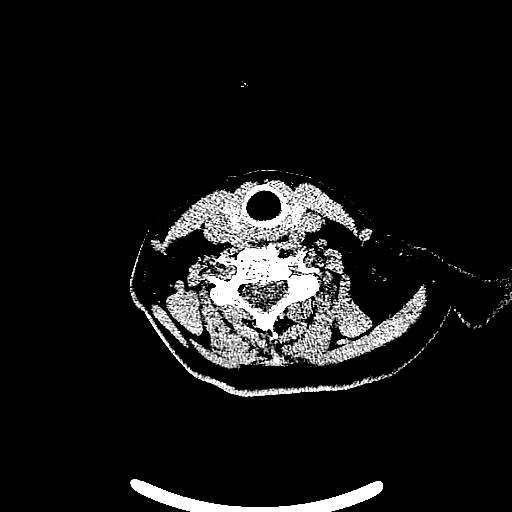
[im 50/100  brain]
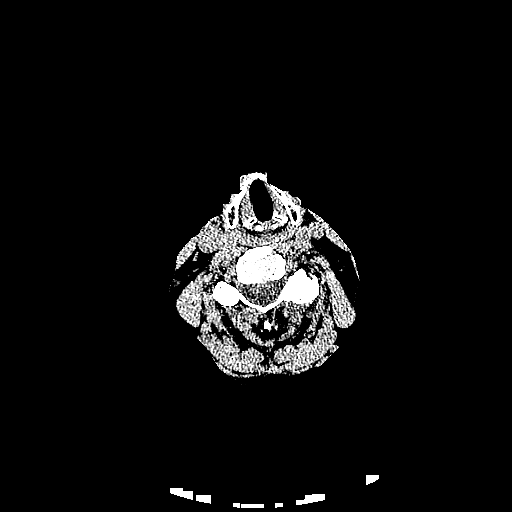
[im 50/100  bone]
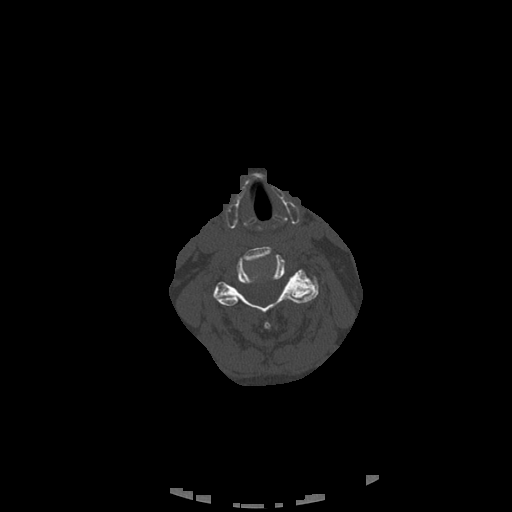
[im 60/100  brain]
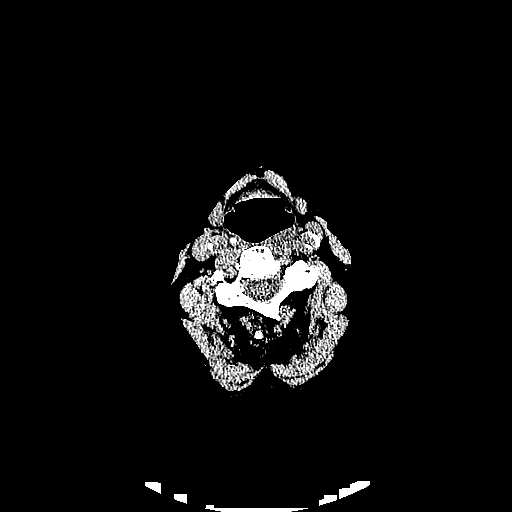
[im 70/100  brain]
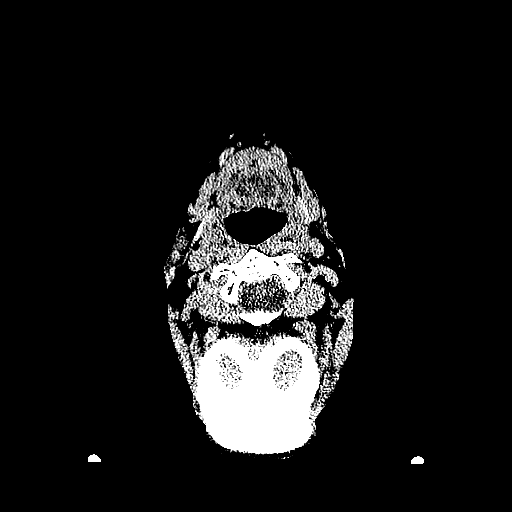
[im 80/100  brain]
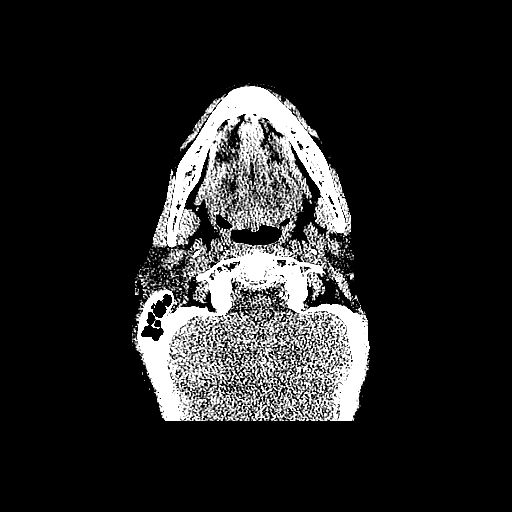
[im 90/100  brain]
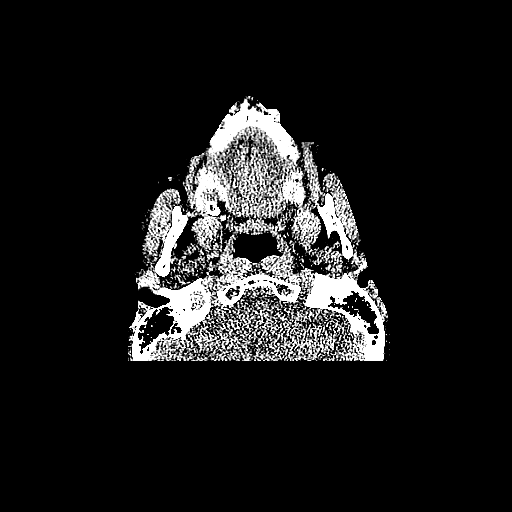
[im 90/100  bone]
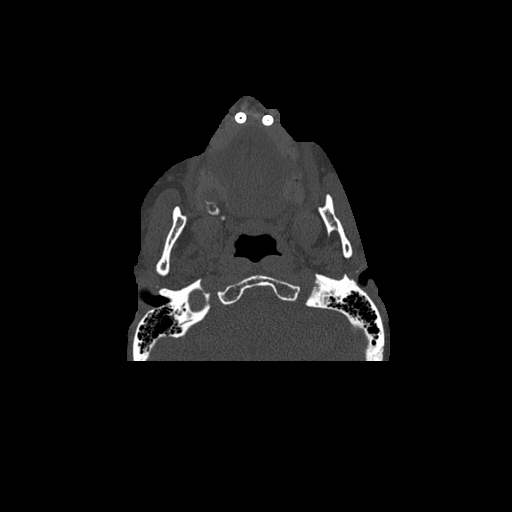

[15 of 47 positions shown; findings below may reference images not displayed]

FINDINGS: CT HEAD FINDINGS

Skull and Sinuses:Right occipital scalp contusion without underlying
fracture.

Orbits: No acute abnormality.

Brain: No evidence of acute abnormality, such as acute infarction,
hemorrhage, hydrocephalus, or mass lesion/mass effect. Stable
pattern of chronic small vessel ischemic change with patchy
bilateral cerebral white matter low density.

CT CERVICAL SPINE FINDINGS

No acute fracture identified. No acute subluxation. There are T1 and
T2 superior endplate fractures which were seen previously. Height
loss is mild, on the order of 10%. Fracture lucency in the upper T1
body is still readily visible. T2 is superior endplate changes are
predominantly sclerotic. No interval worsening height loss. No
posterior element fracturing. There is a band of sclerosis in the
upper T3 body, reference dedicated thoracic spine imaging.
IMPRESSION: 1. No evidence of acute intracranial or cervical spine injury.
2. Pre-existing T1 and T2 compression fractures, with incomplete
healing at the T1 level. No interval worsening of the mild
compression.

## 2015-05-17 IMAGING — CR DG LUMBAR SPINE 2-3V
3 series · 3 of 3 positions shown · non-contrast
Comparison: 02/05/2013

CLINICAL DATA: Fall

EXAM:
LUMBAR SPINE - 2-3 VIEW

[t l-spine a.p.]
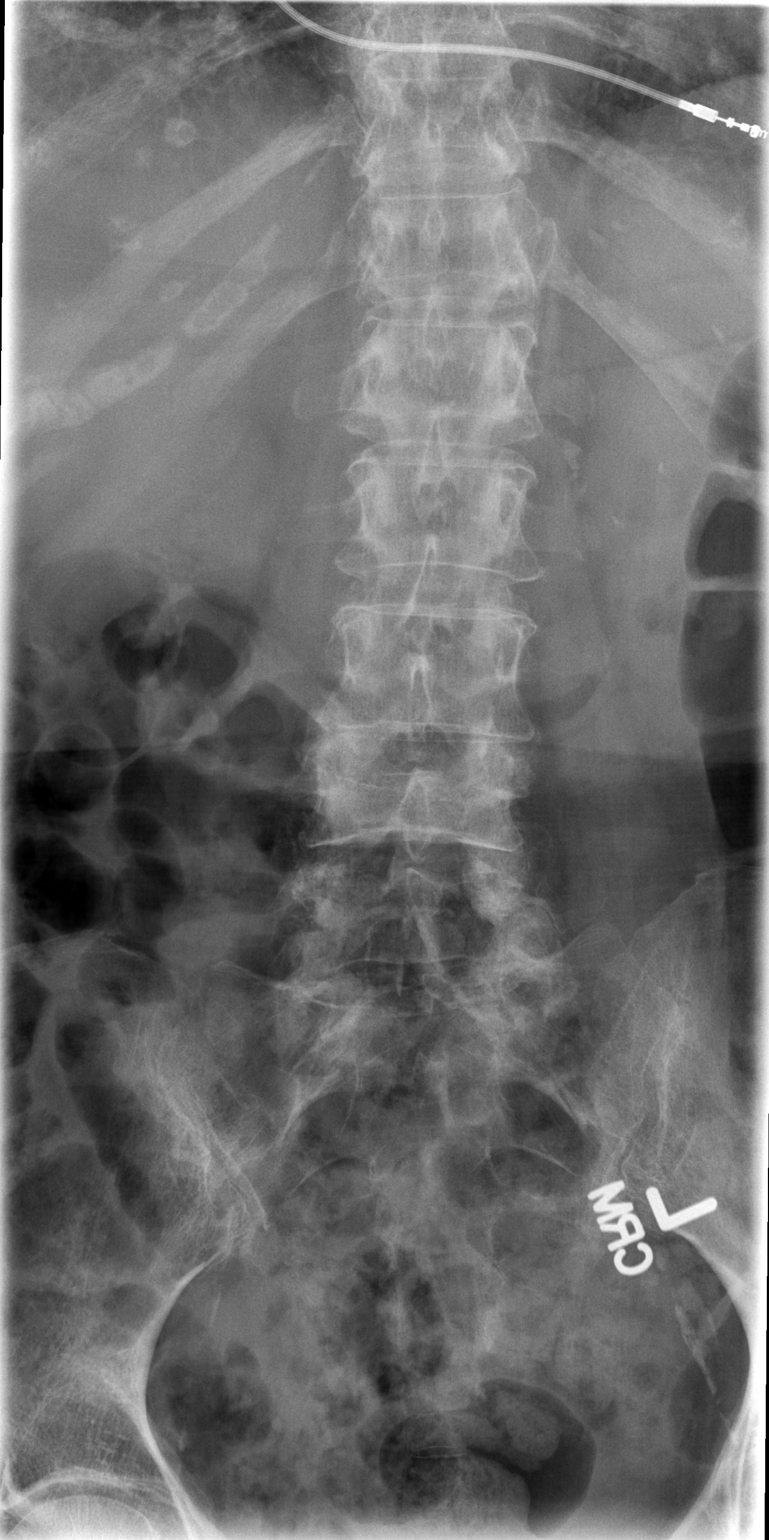

[t l-spine lat]
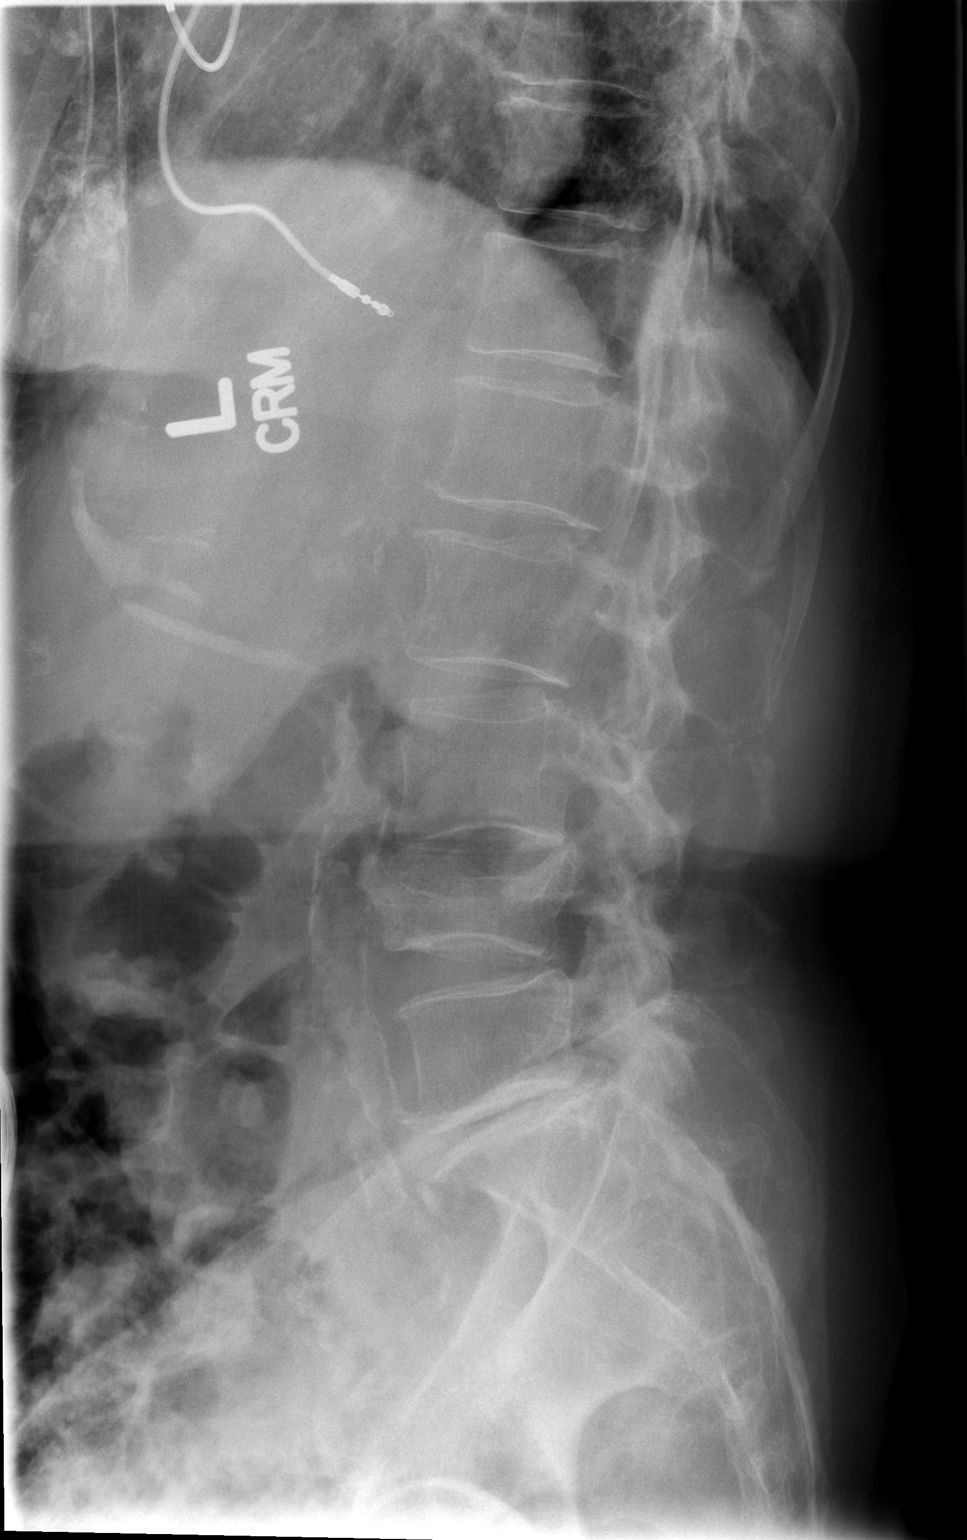

[t l-spine l5-s1 spot]
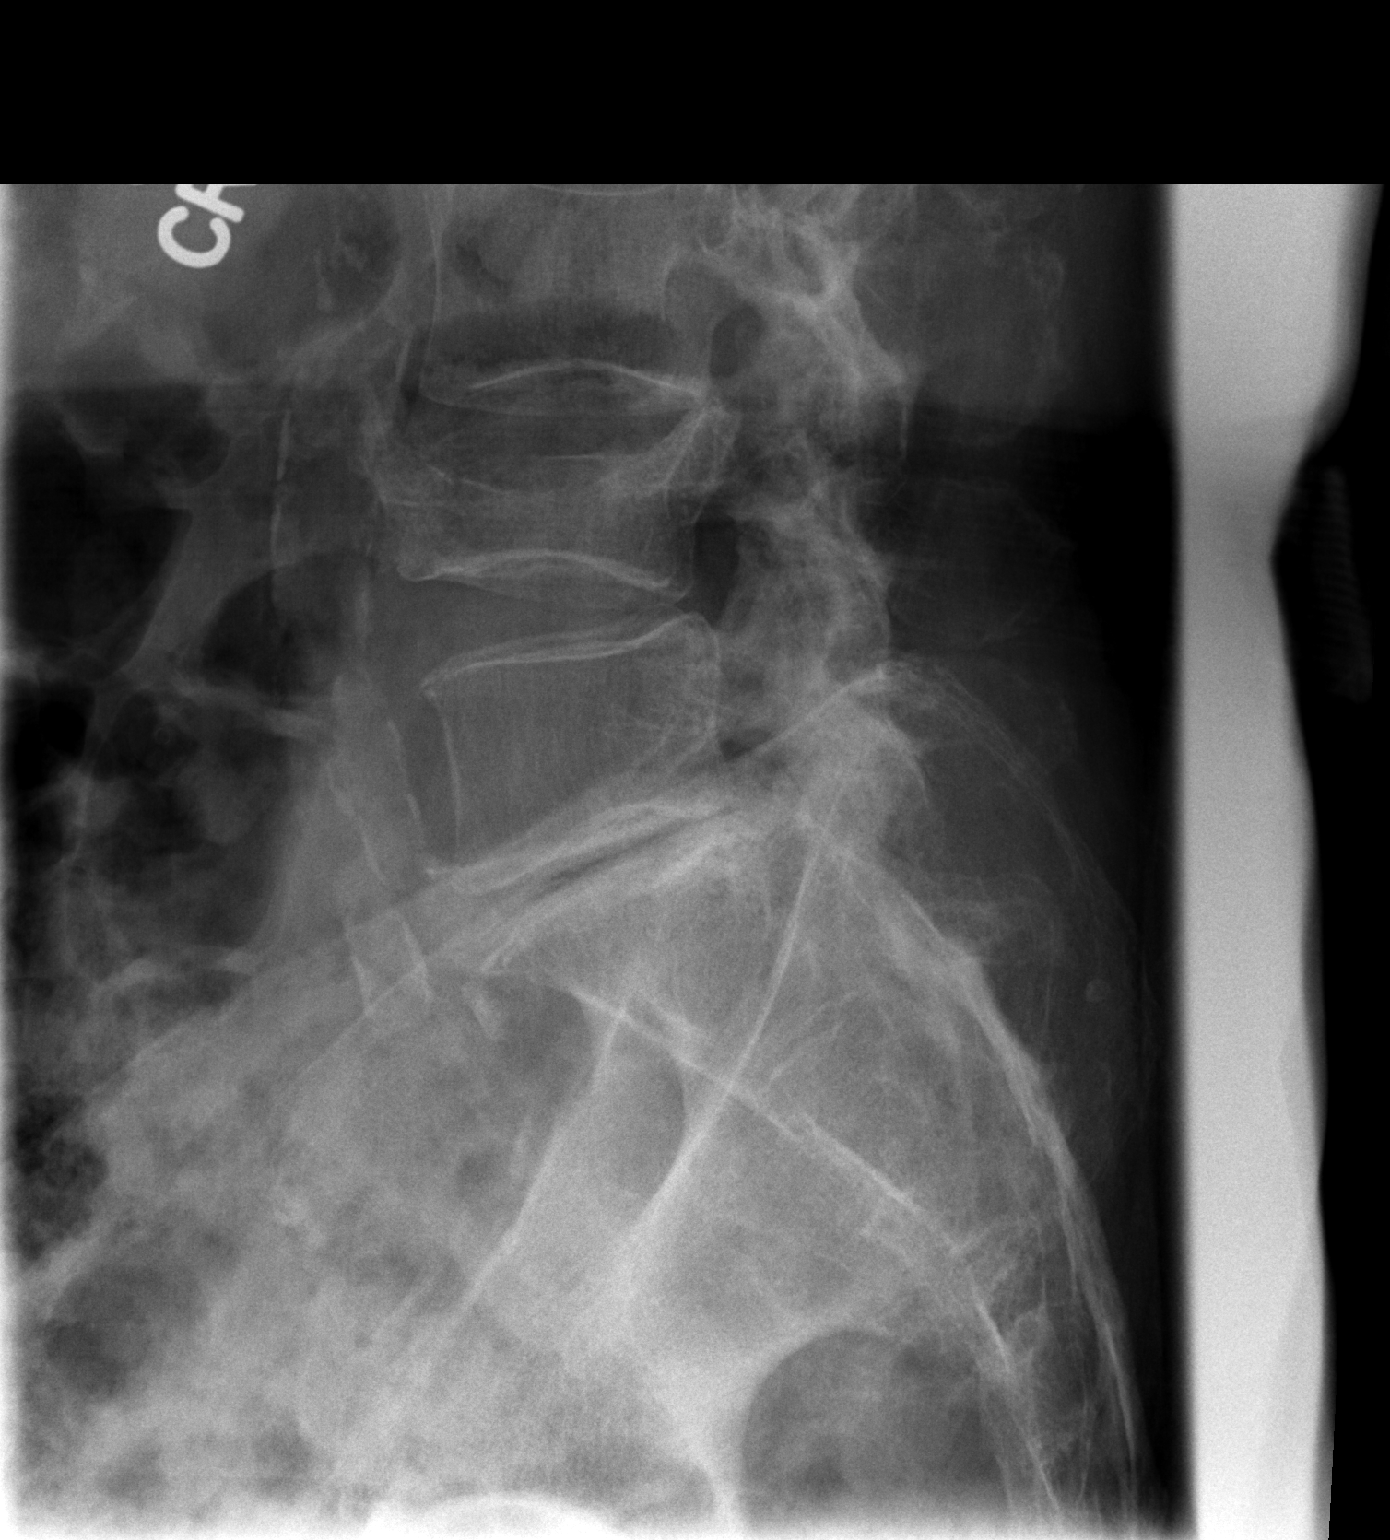

[3 of 3 positions shown; findings below may reference images not displayed]

FINDINGS: There is a new L4 compression fracture with at least 60% loss of
height anteriorly and centrally. Retropulsion of the superior
endplate is suspected. Osteopenia. Severe narrowing of the L5-S1
disc is demonstrated.
IMPRESSION: New L4 compression fracture as described. Retropulsion of the
superior endplate is suspected.

## 2015-05-17 IMAGING — CR DG PELVIS 1-2V
1 series · 1 of 1 positions shown · non-contrast
Comparison: None.

CLINICAL DATA: Fall

EXAM:
PELVIS - 1-2 VIEW

[t pelvis a.p.]
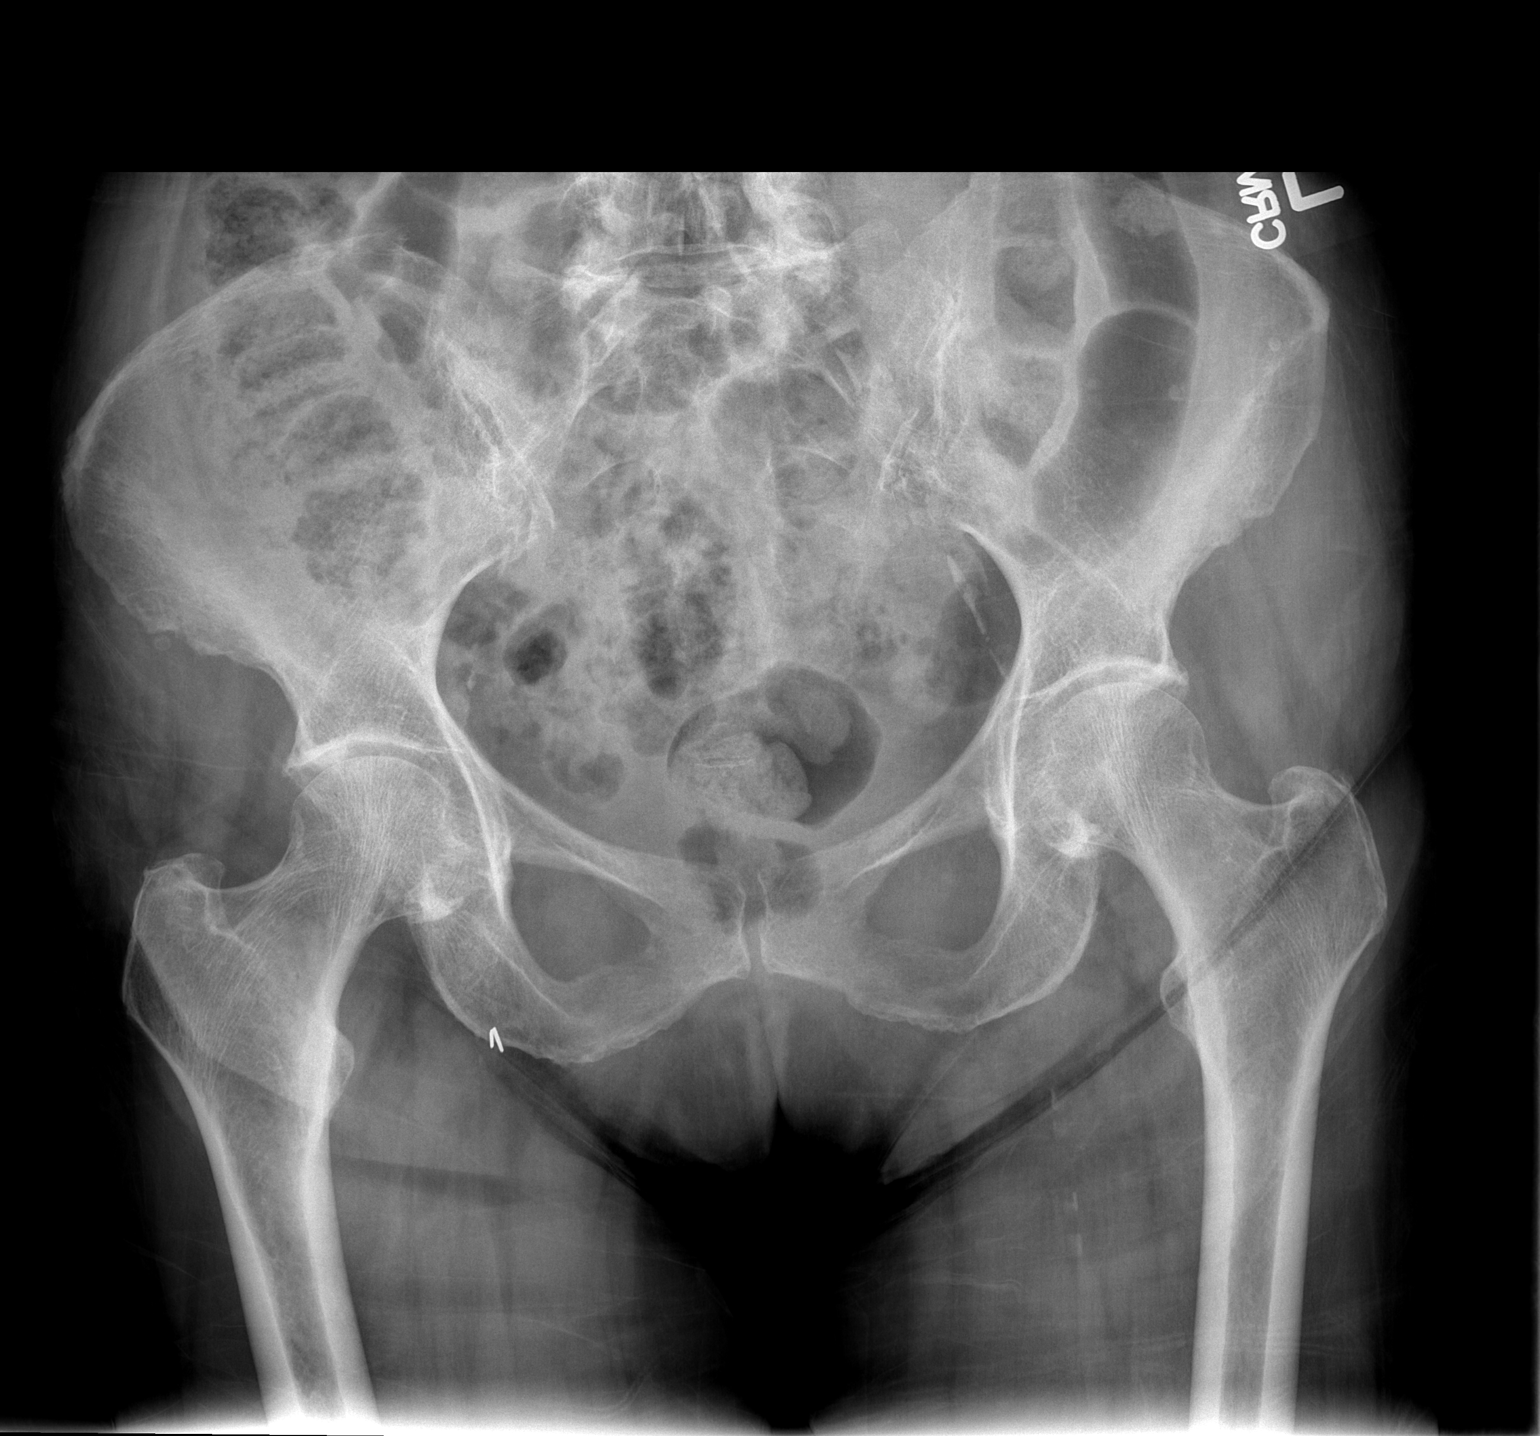

[1 of 1 positions shown; findings below may reference images not displayed]

FINDINGS: L4 compression fracture is noted. Osteopenia. Otherwise no acute
fracture or dislocation.
IMPRESSION: No acute bony injury in the pelvis or femurs.

## 2015-05-18 IMAGING — CT CT L SPINE W/O CM
3 series · 14 of 33 positions shown, 17 images · non-contrast
Comparison: Lumbar spine radiography 04/18/2011.

CLINICAL DATA: Fall.

EXAM:
CT LUMBAR SPINE WITHOUT CONTRAST
TECHNIQUE: Multidetector CT imaging of the lumbar spine was performed without
intravenous contrast administration. Multiplanar CT image
reconstructions were also generated.

[Series 3: l spine 2.0 i30s 3 · axial · 0.29mm/px · z∈[-299,-115]mm · 6 of 121 slices shown, 8 images]
[im 19/121  soft-tissue]
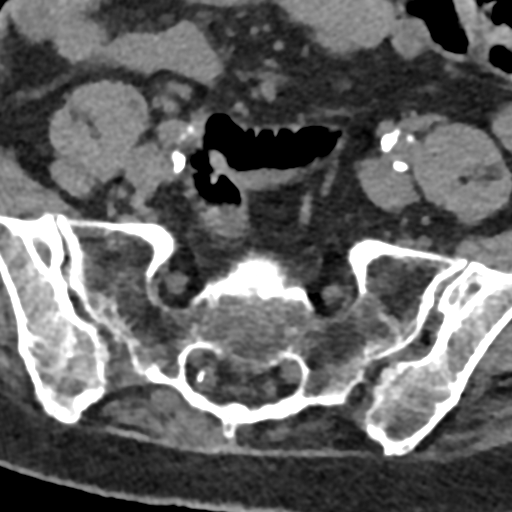
[im 19/121  bone]
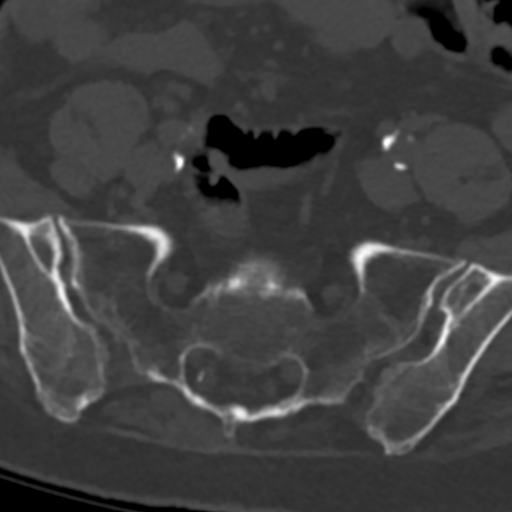
[im 37/121  bone]
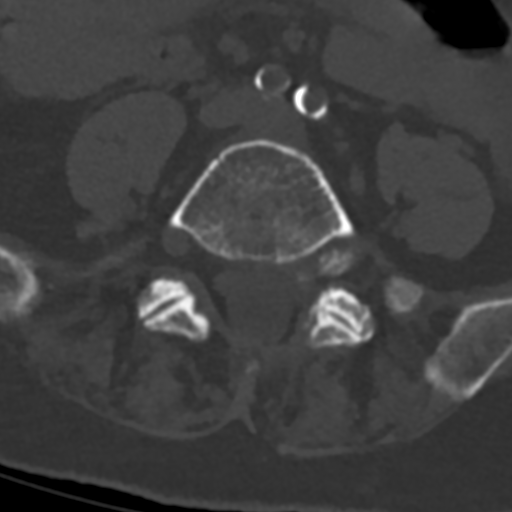
[im 56/121  bone]
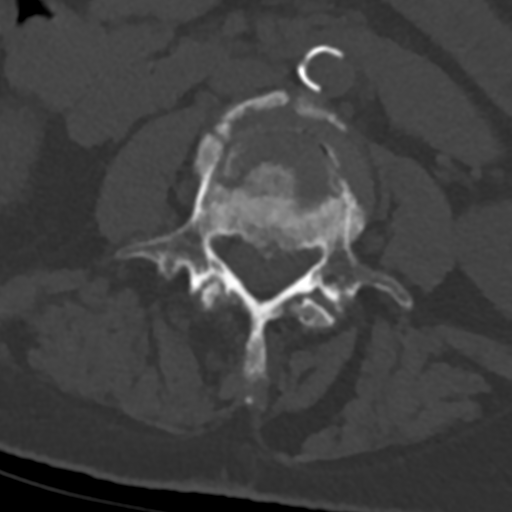
[im 74/121  bone]
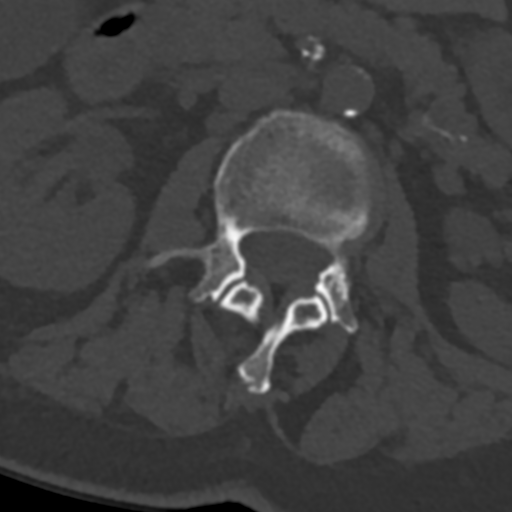
[im 93/121  soft-tissue]
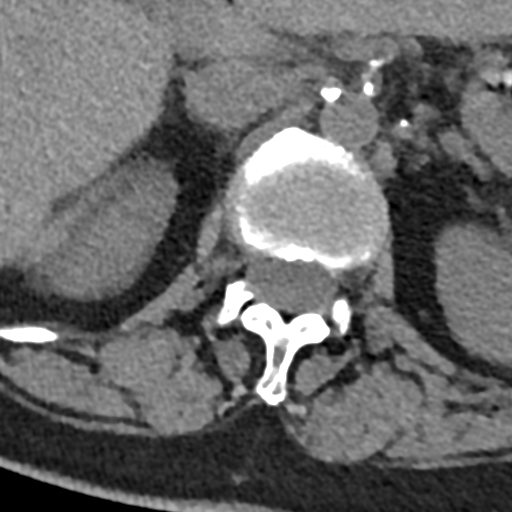
[im 93/121  bone]
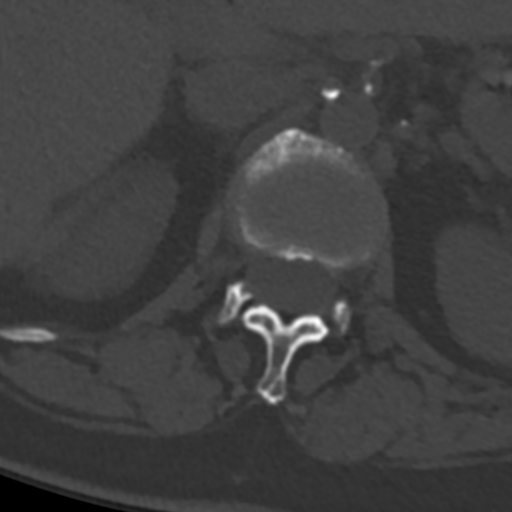
[im 111/121  bone]
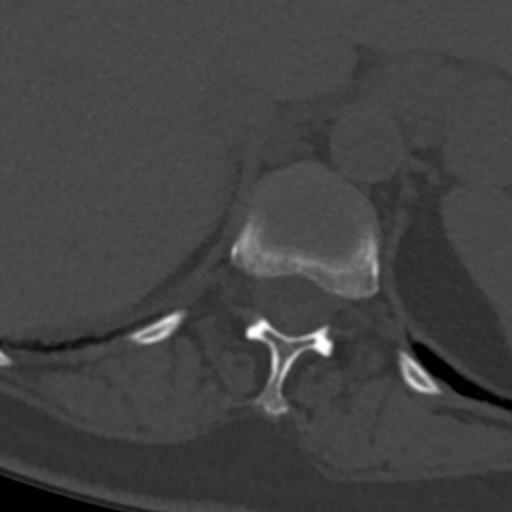

[Series 6: coronal st · coronal · 0.35mm/px · 3 of 61 slices shown]
[im 13/61  bone]
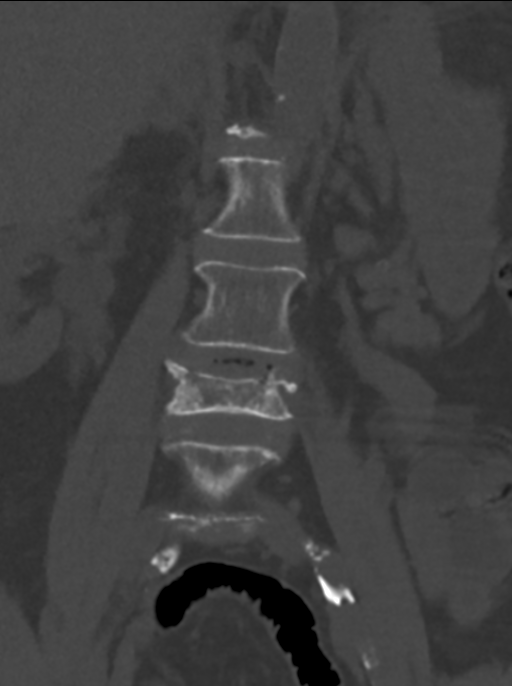
[im 25/61  bone]
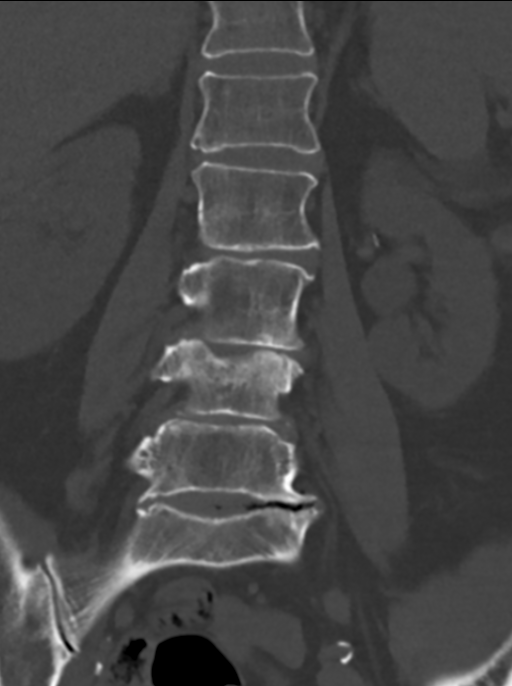
[im 37/61  bone]
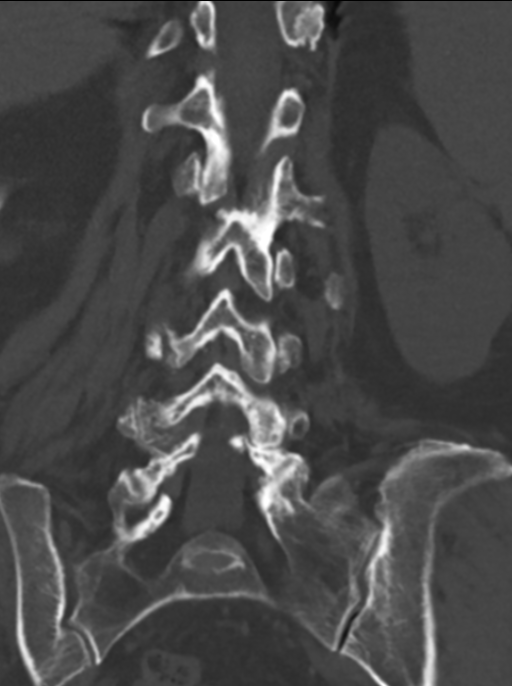

[Series 7: sagittal st · sagittal · 0.35mm/px · 5 of 61 slices shown, 6 images]
[im 21/61  bone]
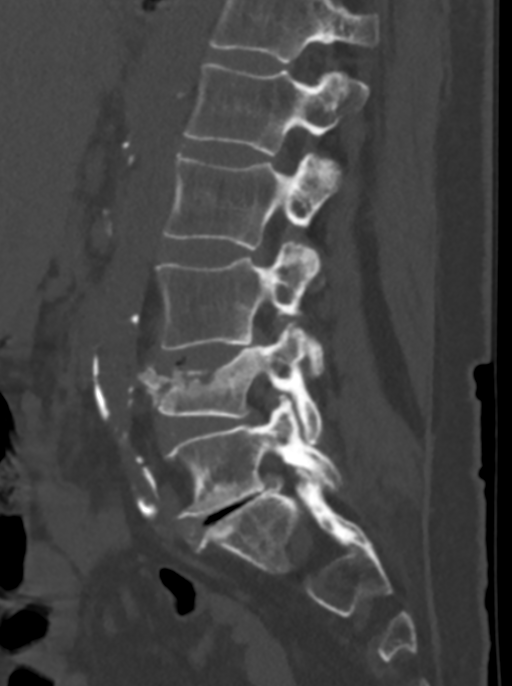
[im 26/61  bone]
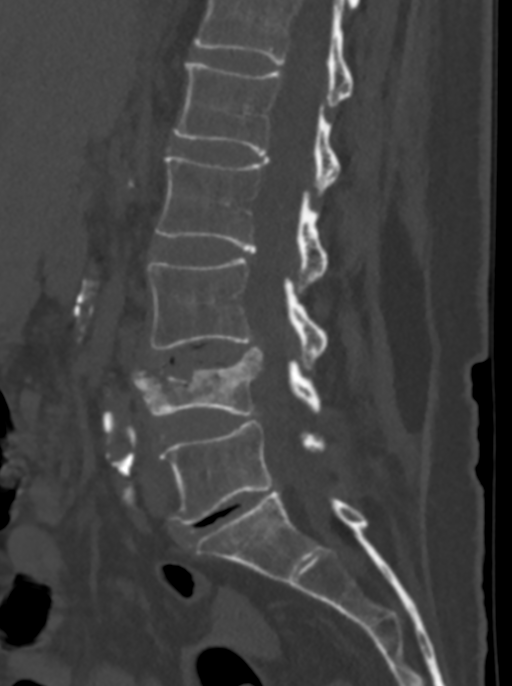
[im 31/61  soft-tissue]
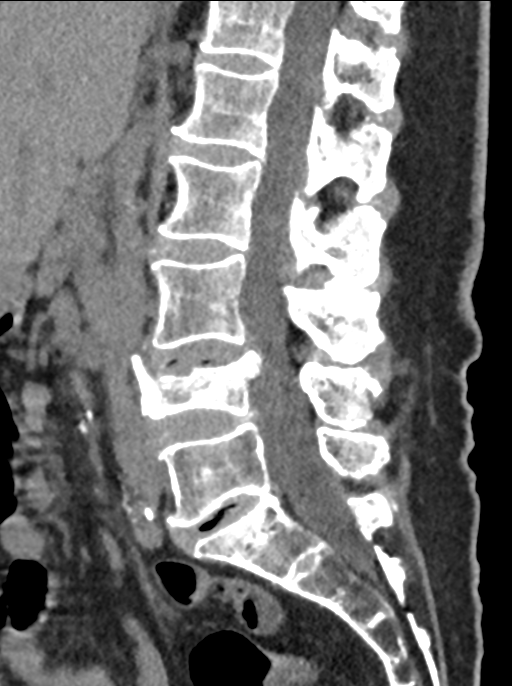
[im 31/61  bone]
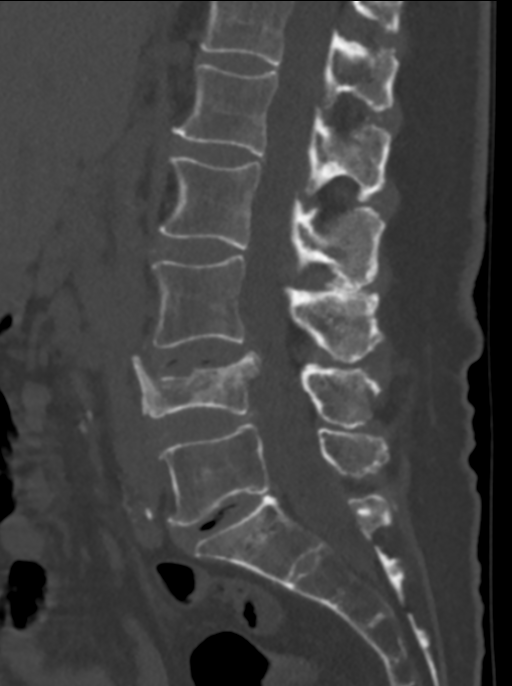
[im 36/61  bone]
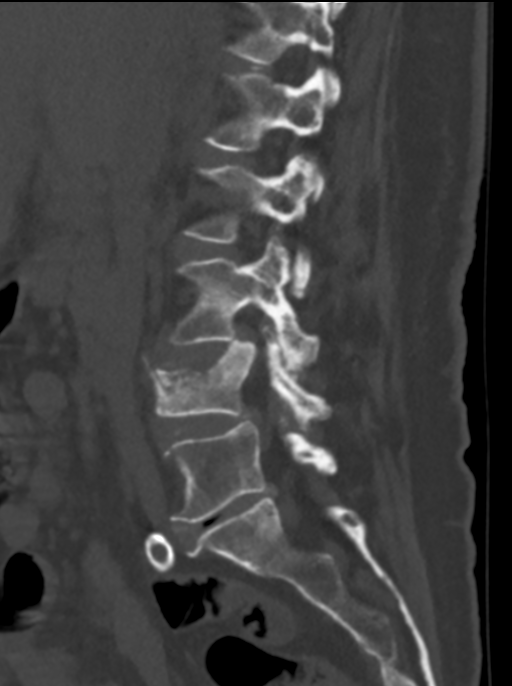
[im 41/61  bone]
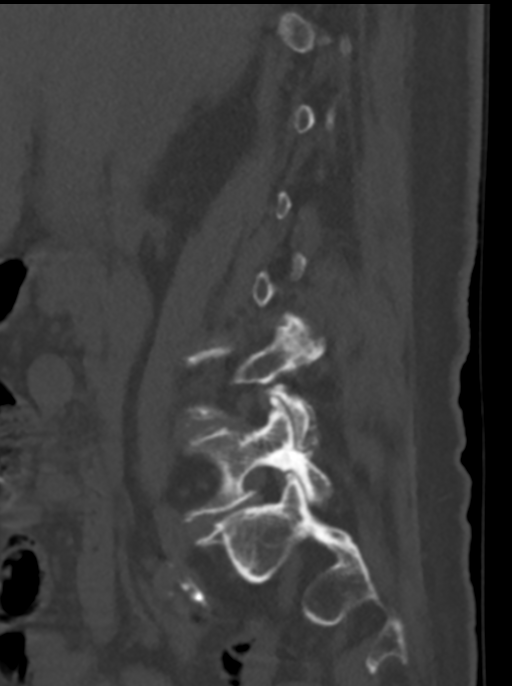

[14 of 33 positions shown; findings below may reference images not displayed]

FINDINGS: L4 vertebral body fracture with comminution. Height loss is up to
60% centrally. There is bony retropulsion which causes AP canal
narrowing to 8 mm. No subluxation or posterior element fracturing.
The body appears sclerotic, but the fracture margins are still
clearly visible.

Diffuse degenerative disc and facet disease, with disc narrowing
most notable at L5-S1 were there is disc bulging and endplate
spurring narrows the left foramen. There has been previous
left-sided L4-5 laminotomy.

Osteopenia.

Abdominal aortic atherosclerosis. Minimally imaged exophytic lesion
from the right kidney, also seen in 7001.
IMPRESSION: Comminuted L4 body fracture which is unhealed, but likely non-acute.
Height loss is up to 60%; moderate bony retropulsion and spinal
canal stenosis.

## 2015-05-19 IMAGING — CT CT ANGIO HEAD
1 of 14 series · 2 of 33 positions shown · IV contrast (CONTRAST)
Comparison: Head CT 11/17/2013

CLINICAL DATA: Blurred vision.  Unsteady gait.

EXAM:
CT ANGIOGRAPHY HEAD
TECHNIQUE: Multidetector CT imaging of the head was performed using the
standard protocol during bolus administration of intravenous
contrast. Multiplanar CT image reconstructions including MIPs were
obtained to evaluate the vascular anatomy.
CONTRAST:  100mL OMNIPAQUE IOHEXOL 350 MG/ML SOLN

[mpr, ax 1x1 mpr, axial · axial · 0.42mm/px · z∈[+286,+339]mm · 2 of 160 slices shown]
[im 54/160  soft-tissue]
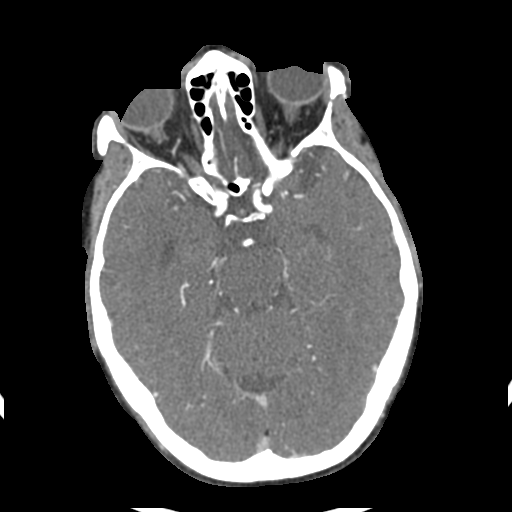
[im 107/160  bone]
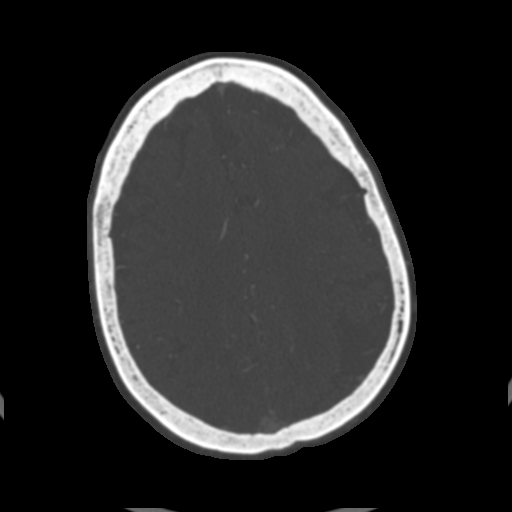

[2 of 33 positions shown; findings below may reference images not displayed]

FINDINGS: Brain shows generalized atrophy. There chronic appearing small
vessel changes throughout the cerebral hemispheric white matter.
There is a punctate parenchymal calcification in the right parietal
region. No sign of mass lesion, hemorrhage, hydrocephalus or
extra-axial collection.

Both internal carotid arteries are patent through the siphon
regions. There is some calcification in the carotid siphons but no
flow-limiting stenosis the anterior and middle cerebral vessels are
patent without visible stenosis, aneurysm or vascular malformation.

Both vertebral arteries are patent. There is some atherosclerotic
calcification at the foramen magnum level but no stenosis. Basilar
artery shows mild atherosclerotic irregularity but no flow-limiting
stenosis. Posterior circulation branch vessels appear patent and
normal.

No abnormal contrast enhancement is demonstrated.

Review of the MIP images confirms the above findings.
IMPRESSION: No major vessel occlusion or definable stenosis. Atherosclerotic
calcification of the carotid arteries in the carotid siphon regions
and of the vertebral arteries at the foramen magnum level.

## 2015-05-30 IMAGING — CR DG RIBS W/ CHEST 3+V BILAT
8 series · 8 of 8 positions shown · non-contrast
Comparison: None.

CLINICAL DATA: Fall.  Bilateral rib injury and pain.

EXAM:
BILATERAL RIBS AND CHEST - 4+ VIEW

[w chest pa]
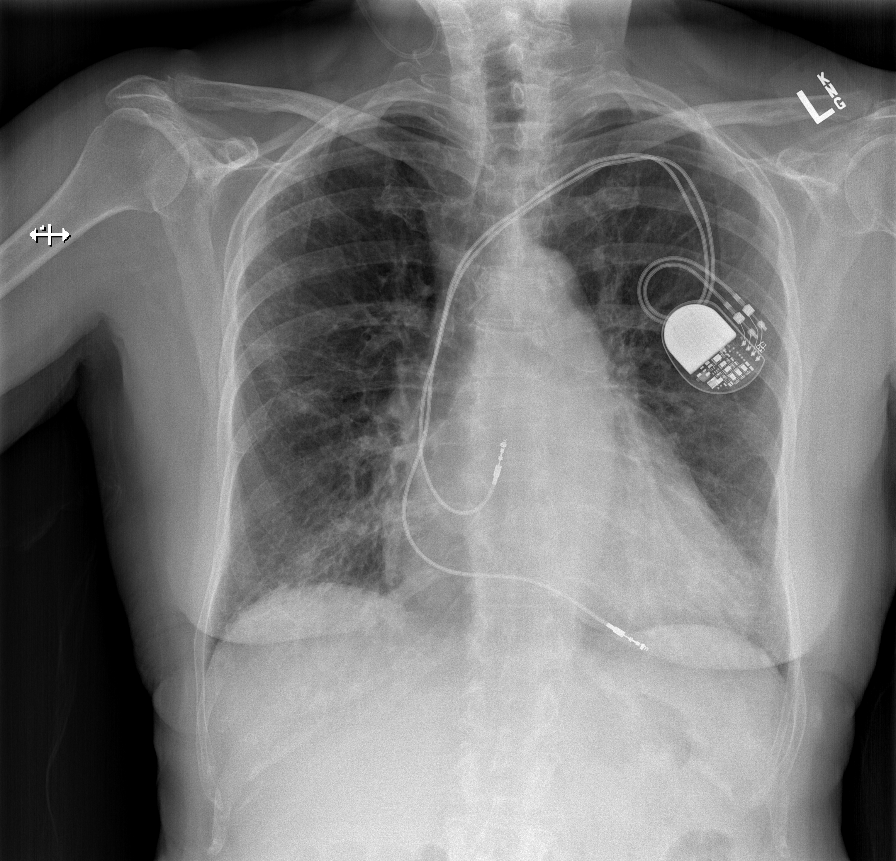

[t ribs ap upper left]
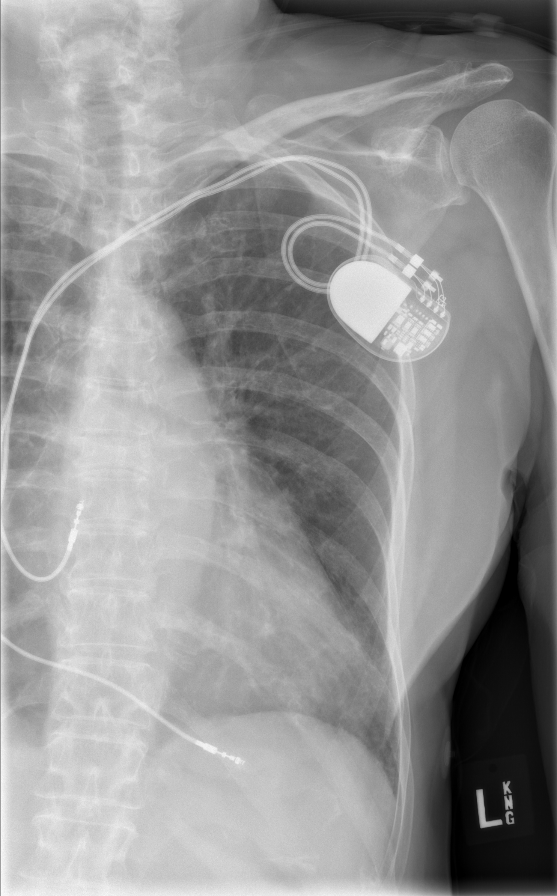

[t ribs ap lower left]
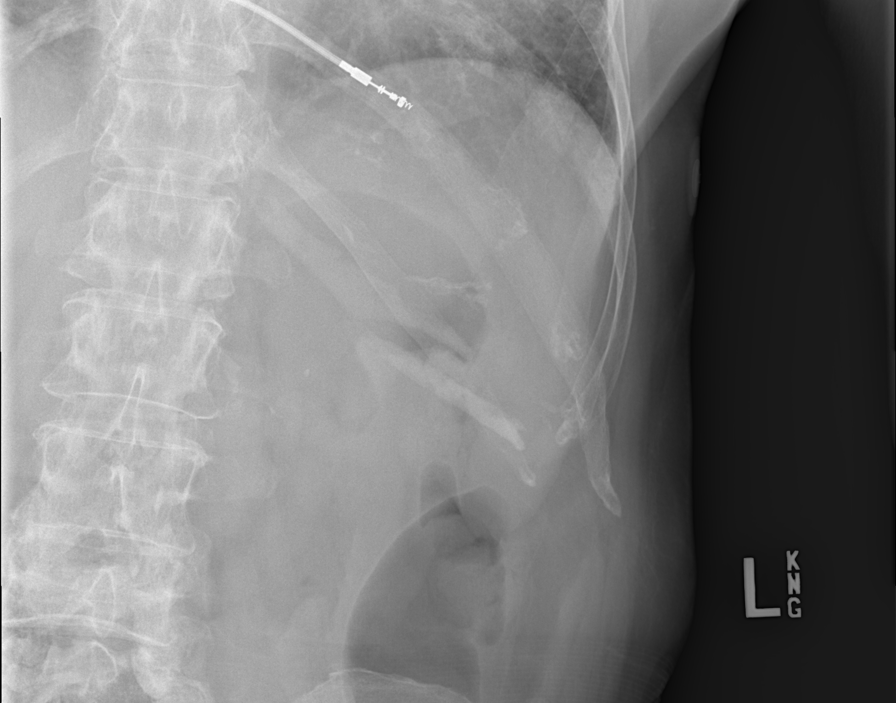

[t ribs lpo left (1 of 2)]
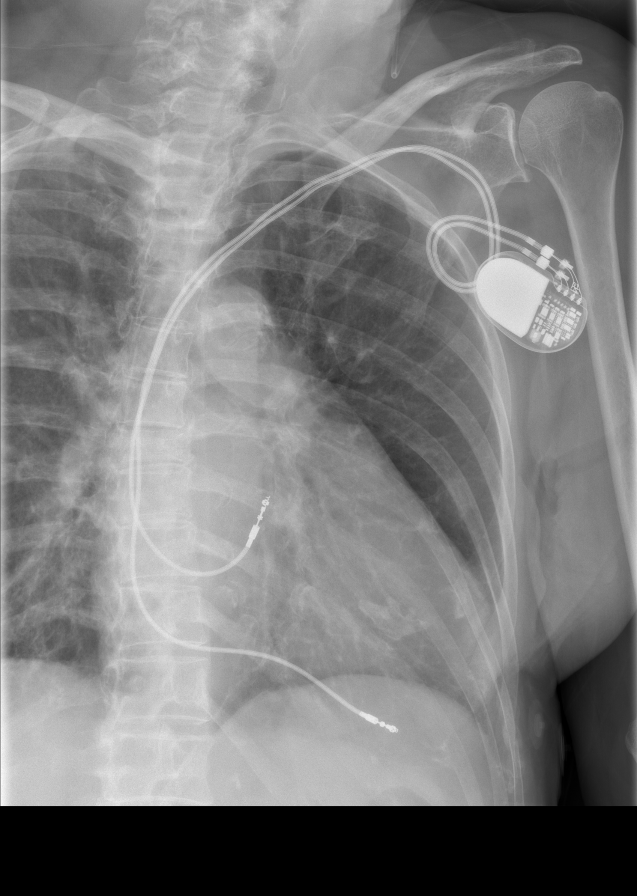

[t ribs lpo left (2 of 2)]
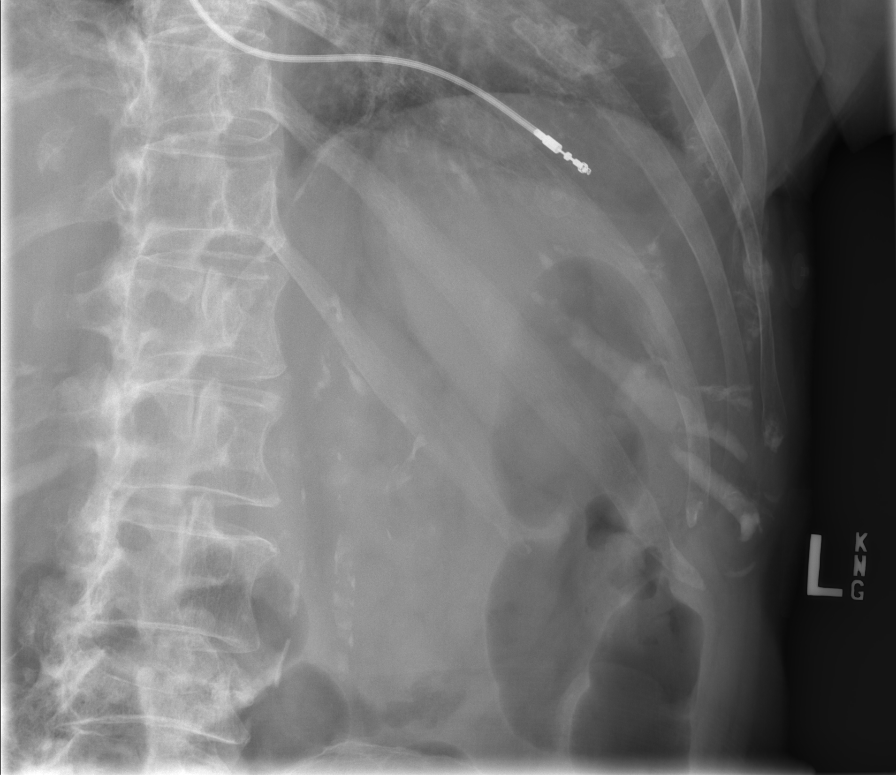

[t ribs ap upper right]
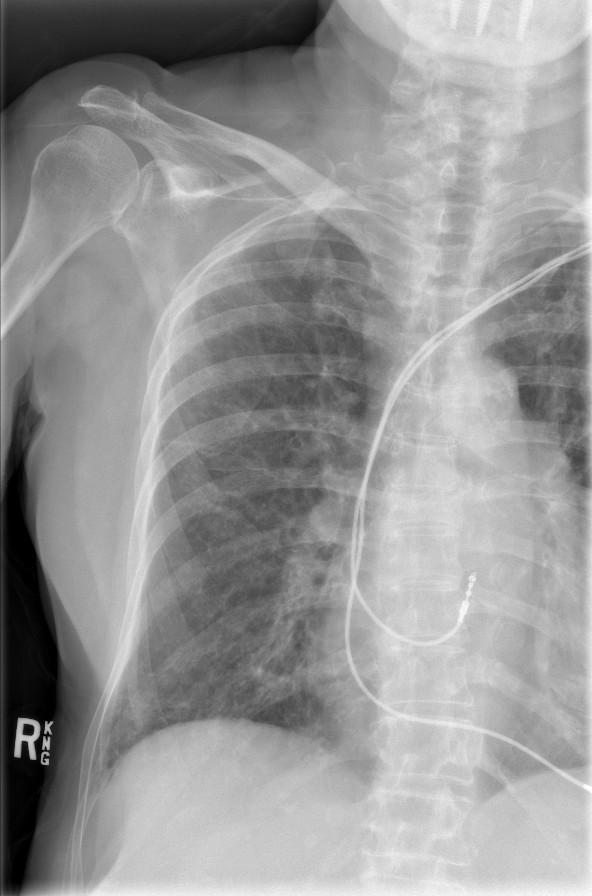

[t ribs ap lower right]
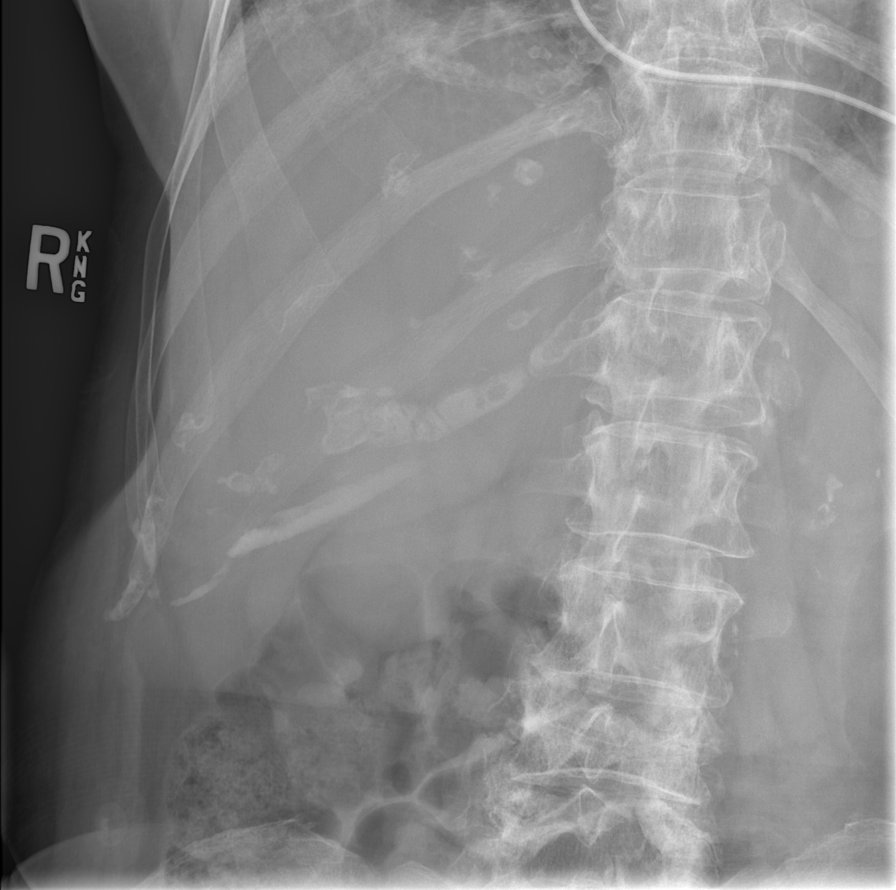

[t ribs rpo right]
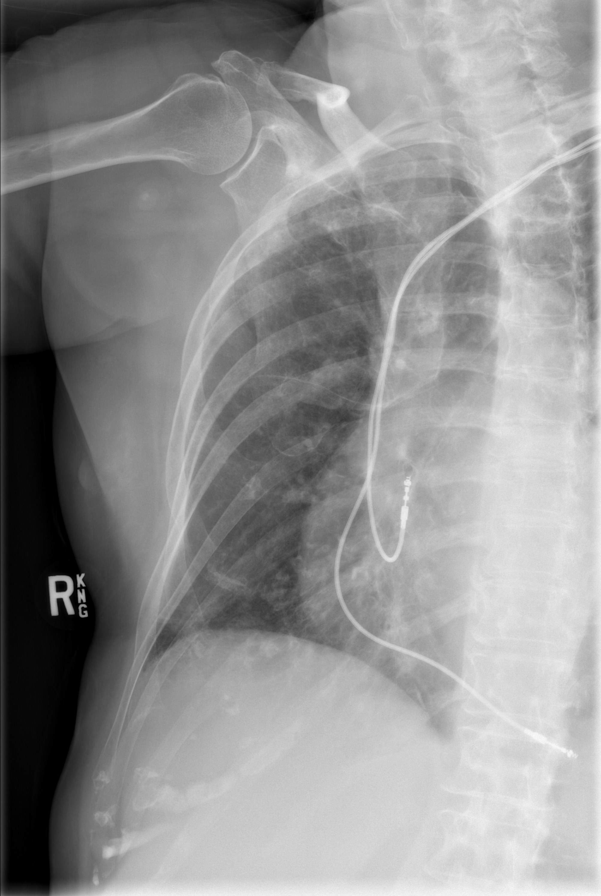

[8 of 8 positions shown; findings below may reference images not displayed]

FINDINGS: Healing subacute fractures are seen involving the left anterolateral
seventh and eighth ribs, which demonstrate early callus formation.
No acute rib fractures are identified. No evidence of pneumothorax
or hemothorax.

Moderate cardiomegaly is stable and dual lead transvenous pacemaker
remains in appropriate position. Chronic interstitial prominence is
stable. No evidence of acute infiltrate or pulmonary edema. No
evidence of pleural effusion.
IMPRESSION: Healing subacute fractures of the left seventh and eighth ribs. No
acute rib fractures identified.

Stable cardiomegaly and chronic pulmonary interstitial prominence.
No active lung disease.

## 2015-06-29 ENCOUNTER — Telehealth: Payer: Self-pay | Admitting: Cardiology

## 2015-06-29 ENCOUNTER — Encounter: Payer: Self-pay | Admitting: *Deleted

## 2015-06-29 ENCOUNTER — Telehealth: Payer: Self-pay

## 2015-06-29 NOTE — Telephone Encounter (Signed)
New message     Nurse states pt daughter would like the machine to be sent to the facility where the pt is residing at this time for the pacemaker monitoring  Nurse also needs an order for doing the pacemaker check Please call to discuss

## 2015-06-29 NOTE — Telephone Encounter (Signed)
SNF calling about equpment for pacer check.  Referred call to [address] office

## 2015-06-29 NOTE — Telephone Encounter (Signed)
LMOVM returning [name]'s phone call and requesting call back.

## 2015-06-29 NOTE — Telephone Encounter (Signed)
[name], patient's hospice nurse, returned phone call.  Informed [name] that I will fax letter stating patient's device should be checked every 3 months remotely.  New [employer] monitor ordered to patient at facility address.  Next remote check to be scheduled for 07/20/15 to allow time for transmitter to arrive.  [name] appreciative of call back and denies any additional questions or concerns at this time.

## 2015-07-20 ENCOUNTER — Encounter: Payer: Medicare Other | Admitting: *Deleted

## 2015-07-20 ENCOUNTER — Telehealth: Payer: Self-pay | Admitting: Cardiology

## 2015-07-20 NOTE — Telephone Encounter (Signed)
Attempted to confirm remote transmission with pt. No answer and was unable to leave a message.   

## 2015-07-21 ENCOUNTER — Encounter: Payer: Self-pay | Admitting: Cardiology

## 2015-08-12 ENCOUNTER — Encounter: Payer: Self-pay | Admitting: Emergency Medicine

## 2015-08-12 ENCOUNTER — Emergency Department
Admission: EM | Admit: 2015-08-12 | Discharge: 2015-08-12 | Disposition: A | Discharge status: Home / Self Care | Attending: Emergency Medicine | Admitting: Emergency Medicine

## 2015-08-12 ENCOUNTER — Emergency Department

## 2015-08-12 DIAGNOSIS — Y92128 Other place in nursing home as the place of occurrence of the external cause: Secondary | ICD-10-CM | POA: Diagnosis not present

## 2015-08-12 DIAGNOSIS — E1149 Type 2 diabetes mellitus with other diabetic neurological complication: Secondary | ICD-10-CM | POA: Diagnosis not present

## 2015-08-12 DIAGNOSIS — Y9389 Activity, other specified: Secondary | ICD-10-CM | POA: Diagnosis not present

## 2015-08-12 DIAGNOSIS — Y998 Other external cause status: Secondary | ICD-10-CM | POA: Diagnosis not present

## 2015-08-12 DIAGNOSIS — W01198A Fall on same level from slipping, tripping and stumbling with subsequent striking against other object, initial encounter: Secondary | ICD-10-CM | POA: Diagnosis not present

## 2015-08-12 DIAGNOSIS — Z87891 Personal history of nicotine dependence: Secondary | ICD-10-CM | POA: Diagnosis not present

## 2015-08-12 DIAGNOSIS — S0990XA Unspecified injury of head, initial encounter: Secondary | ICD-10-CM | POA: Diagnosis present

## 2015-08-12 DIAGNOSIS — S0003XA Contusion of scalp, initial encounter: Secondary | ICD-10-CM | POA: Insufficient documentation

## 2015-08-12 DIAGNOSIS — W19XXXA Unspecified fall, initial encounter: Secondary | ICD-10-CM

## 2015-08-12 DIAGNOSIS — F0390 Unspecified dementia without behavioral disturbance: Secondary | ICD-10-CM | POA: Insufficient documentation

## 2015-08-12 DIAGNOSIS — Z880 Allergy status to penicillin: Secondary | ICD-10-CM | POA: Diagnosis not present

## 2015-08-12 DIAGNOSIS — I119 Hypertensive heart disease without heart failure: Secondary | ICD-10-CM | POA: Insufficient documentation

## 2015-08-12 DIAGNOSIS — S161XXA Strain of muscle, fascia and tendon at neck level, initial encounter: Secondary | ICD-10-CM | POA: Insufficient documentation

## 2015-08-12 NOTE — Discharge Instructions (Signed)
Head Injury °You have received a head injury. It does not appear serious at this time. Headaches and vomiting are common following head injury. It should be easy to awaken from sleeping. Sometimes it is necessary for you to stay in the emergency department for a while for observation. Sometimes admission to the hospital may be needed. After injuries such as yours, most problems occur within the first 24 hours, but side effects may occur up to 7-10 days after the injury. It is important for you to carefully monitor your condition and contact your health care provider or seek immediate medical care if there is a change in your condition. °WHAT ARE THE TYPES OF HEAD INJURIES? °Head injuries can be as minor as a bump. Some head injuries can be more severe. More severe head injuries include: °· A jarring injury to the brain (concussion). °· A bruise of the brain (contusion). This mean there is bleeding in the brain that can cause swelling. °· A cracked skull (skull fracture). °· Bleeding in the brain that collects, clots, and forms a bump (hematoma). °WHAT CAUSES A HEAD INJURY? °A serious head injury is most likely to happen to someone who is in a car wreck and is not wearing a seat belt. Other causes of major head injuries include bicycle or motorcycle accidents, sports injuries, and falls. °HOW ARE HEAD INJURIES DIAGNOSED? °A complete history of the event leading to the injury and your current symptoms will be helpful in diagnosing head injuries. Many times, pictures of the brain, such as CT or MRI are needed to see the extent of the injury. Often, an overnight hospital stay is necessary for observation.  °WHEN SHOULD I SEEK IMMEDIATE MEDICAL CARE?  °You should get help right away if: °· You have confusion or drowsiness. °· You feel sick to your stomach (nauseous) or have continued, forceful vomiting. °· You have dizziness or unsteadiness that is getting worse. °· You have severe, continued headaches not relieved by  medicine. Only take over-the-counter or prescription medicines for pain, fever, or discomfort as directed by your health care provider. °· You do not have normal function of the arms or legs or are unable to walk. °· You notice changes in the black spots in the center of the colored part of your eye (pupil). °· You have a clear or bloody fluid coming from your nose or ears. °· You have a loss of vision. °During the next 24 hours after the injury, you must stay with someone who can watch you for the warning signs. This person should contact local emergency services (911 in the [place] if you have seizures, you become unconscious, or you are unable to wake up. °HOW CAN I PREVENT A HEAD INJURY IN THE FUTURE? °The most important factor for preventing major head injuries is avoiding motor vehicle accidents.  To minimize the potential for damage to your head, it is crucial to wear seat belts while riding in motor vehicles. Wearing helmets while bike riding and playing collision sports (like football) is also helpful. Also, avoiding dangerous activities around the house will further help reduce your risk of head injury.  °WHEN CAN I RETURN TO NORMAL ACTIVITIES AND ATHLETICS? °You should be reevaluated by your health care provider before returning to these activities. If you have any of the following symptoms, you should not return to activities or contact sports until 1 week after the symptoms have stopped: °· Persistent headache. °· Dizziness or vertigo. °· Poor attention and concentration. °· Confusion. °·   Memory problems. °· Nausea or vomiting. °· Fatigue or tire easily. °· Irritability. °· Intolerant of bright lights or loud noises. °· Anxiety or depression. °· Disturbed sleep. °MAKE SURE YOU:  °· Understand these instructions. °· Will watch your condition. °· Will get help right away if you are not doing well or get worse. °Document Released: 10/28/2005 Document Revised: 11/02/2013 Document Reviewed:  07/05/2013 °ExitCare® Patient Information ©2015 [employer]. This information is not intended to replace advice given to you by your health care provider. Make sure you discuss any questions you have with your health care provider. ° ° °Fall Prevention and Home Safety °Falls cause injuries and can affect all age groups. It is possible to prevent falls.  °HOW TO PREVENT FALLS °· Wear shoes with rubber soles that do not have an opening for your toes. °· Keep the inside and outside of your house well lit. °· Use night lights throughout your home. °· Remove clutter from floors. °· Clean up floor spills. °· Remove throw rugs or fasten them to the floor with carpet tape. °· Do not place electrical cords across pathways. °· Put grab bars by your tub, shower, and toilet. Do not use towel bars as grab bars. °· Put handrails on both sides of the stairway. Fix loose handrails. °· Do not climb on stools or stepladders, if possible. °· Do not wax your floors. °· Repair uneven or unsafe sidewalks, walkways, or stairs. °· Keep items you use a lot within reach. °· Be aware of pets. °· Keep emergency numbers next to the telephone. °· Put smoke detectors in your home and near bedrooms. °Ask your doctor what other things you can do to prevent falls. °Document Released: 08/24/2009 Document Revised: 04/28/2012 Document Reviewed: 01/28/2012 °ExitCare® Patient Information ©2015 [employer]. This information is not intended to replace advice given to you by your health care provider. Make sure you discuss any questions you have with your health care provider. ° °

## 2015-08-12 NOTE — ED Notes (Signed)
Pt unable to sign for herself due to her advanced dementia. [name] notified of discharge and is unable to transport home and facility unable to transport. Information given to facility about discharge instructions.

## 2015-08-12 NOTE — ED Notes (Signed)
Pt arrived via EMS from [facility]. Per EMS, staff says she is currently at her baseline.  Pt in c-collar on arrival.  C/o neck and head pain. Pt states she fell on top of her head. Staff witnessed fall-patient slid out of wheelchair and hit her head.

## 2015-08-12 NOTE — ED Notes (Signed)
Patient transported to CT 

## 2015-08-12 NOTE — ED Provider Notes (Signed)
[facility] Emergency Department Provider Note   L5 caveat: Review of systems and history is limited by her dementia  Time seen: ----------------------------------------- 1:46 PM on 08/12/2015 -----------------------------------------    I have reviewed the triage vital signs and the nursing notes.   HISTORY  Chief Complaint Fall    HPI [name] [name] is a 79 y.o. female who presents to ER after a fall. She is in the memory care unit and was complaining of head and neck pain prior to arrival. Staff witnessed the patient slide out of the wheelchair and hit her head. Currently denies any other complaints.   Past Medical History  Diagnosis Date  . Hypertrophic cardiomyopathy (HCC)     a. 11/2013 Echo: EF 55-60%, basal inf HK, sev dil LA, no evidence of HCM.  PASP .  [name] Hypertension   . Situational mixed anxiety and depressive disorder   . Hypercholesterolemia   . Falls frequently     coumadin stopped  . Pulmonary HTN (HCC)     a. has had prior right heart cath in 2010; was felt that most likely due to elevated left sided pressures and would not benefit from vasodilator therapy;  b. 11/2013 Echo: PASP .  [name] Oxygen dependent     "3L 24/7" (08/05/2013)  . COPD (chronic obstructive pulmonary disease) (HCC)   . TIA (transient ischemic attack)   . Atrial fibrillation (HCC)     a. Chronic; No longer on coumadin 2/2 frequent falls.  . CHF (congestive heart failure) (HCC)   . Pacemaker     a. 04/2012 MDT [id number] [employer] PPM, ser #: [id number].  . Varicose veins   . History of pneumonia     "couple times; long time ago" (08/05/2013)  . Type II diabetes mellitus (HCC)   . Anemia     "as a child" (08/05/2013)  . GERD (gastroesophageal reflux disease)   . H/O hiatal hernia   . Osteoarthritis   . Arthritis     "all over me; in all my joints" (08/05/2013)  . Coccyx pain   . Gout     "right foot" (08/05/2013)  . Anxiety   . Depression   . Chest pain     a. 08/2011 Myoview: sm, fixed anteroseptal defect (attenuation), no ischemia, EF 62%.    Patient Active Problem List   Diagnosis Date Noted  . Closed head injury 05/30/2014  . Pulmonary hypertension, moderate to severe 05/30/2014  . DM (diabetes mellitus), type 2 with neurological complications 05/30/2014  . Hypoglycemia 05/29/2014  . Hypokalemia 05/29/2014  . Subdural hematoma caused by concussion 05/28/2014  . hemorrhagic cerebral contusion 05/28/2014  . Dyslipidemia 03/07/2014  . Chronic respiratory failure 01/01/2014  . Chest pain   . UTI (urinary tract infection) 11/19/2013  . Compression fracture of L4 lumbar vertebra 11/18/2013  . Syncope 11/18/2013  . Protein-calorie malnutrition, severe 11/18/2013  . Syncope and collapse 11/17/2013  . Closed compression fracture of L4 lumbar vertebra 11/17/2013  . Gait difficulty 10/05/2013  . Fall (on)(from) incline, initial encounter 10/03/2013  . Essential hypertension, benign 10/03/2013  . Unsteady gait 10/03/2013  . Peripheral neuropathy 10/03/2013  . Back pain 08/06/2013  . Near syncope 08/06/2013  . Pulmonary hypertension 08/05/2013  . Abnormality of gait 05/24/2013  . Acute respiratory distress 05/08/2013  . Pulmonary edema, acute 05/08/2013  . Benign hypertensive heart disease without heart failure   . Pulmonary HTN   . Oxygen dependent   . COPD (chronic obstructive pulmonary disease)   .  Fatigue due to cardiopulmonary and deconditioning 04/23/2013  . Memory deficit 02/07/2013  . Memory change 12/11/2012  . TIA (transient ischemic attack) 02/19/2012  . Chest pain at rest 08/05/2011  . Type II or unspecified type diabetes mellitus without mention of complication, uncontrolled 06/12/2011  . Fall 03/22/2011  . Mitral regurgitation 03/13/2011  . Hypertrophic cardiomyopathy   . Osteoarthritis   . Situational mixed anxiety and depressive disorder   . Pacemaker 02/26/2011  . Atrial fibrillation 01/28/2011  . CHRONIC  OBSTRUCTIVE PULMONARY DISEASE, MODERATE 05/23/2010  . DEPRESSIVE DISORDER NOT ELSEWHERE CLASSIFIED 04/11/2010  . HYPERLIPIDEMIA 05/31/2009  . HYPERTENSION 05/31/2009  . ALLERGIC RHINITIS 05/31/2009  . SHORTNESS OF BREATH 05/31/2009  . Cough 05/31/2009    Past Surgical History  Procedure Laterality Date  . Total knee arthroplasty Right 2011  . Tonsillectomy    . Appendectomy    . Abdominal hysterectomy      "partial" (08/05/2013)  . Dilation and curettage of uterus      "several" (08/05/2013)  . Foot neuroma surgery Right   . Insert / replace / remove pacemaker  1998; 2000's; 2014  . Cardiac catheterization      "more than once" (08/05/2013)  . Back surgery      "bone spur removed; mid back" (08/05/2013)  . Pacemaker generator change N/A 04/24/2012    Procedure: PACEMAKER GENERATOR CHANGE;  Surgeon: Duke Salvia, MD;  Location: [facility] CATH LAB;  Service: Cardiovascular;  Laterality: N/A;    Allergies Aspirin; Calan; Crestor; Escitalopram oxalate; Lipitor; Lopressor; Nitroglycerin; Norvasc; Nsaids; Oxycodone; Prednisone; Sulfa antibiotics; Vimovo; and Penicillins  Social History Social History  Substance Use Topics  . Smoking status: Former Smoker -- 1.00 packs/day for 40 years    Types: Cigarettes    Quit date: 11/15/2005  . Smokeless tobacco: Never Used  . Alcohol Use: No    Review of Systems Patient denies complaints of an head and neck pain   ____________________________________________   PHYSICAL EXAM:  VITAL SIGNS: ED Triage Vitals  Enc Vitals Group     BP 08/12/15 1243 160/58 mmHg     Pulse Rate 08/12/15 1243 82     Resp --      Temp 08/12/15 1243 98.2 F (36.8 C)     Temp Source 08/12/15 1243 Oral     SpO2 08/12/15 1243 100 %     Weight 08/12/15 1243 106 lb 11.2 oz (48.399 kg)     Height 08/12/15 1243  (1.6 m)     Head Cir --      Peak Flow --      Pain Score --      Pain Loc --      Pain Edu? --      Excl. in GC? --     Constitutional: Alert  but disoriented. Well appearing and in no distress. Eyes: Conjunctivae are normal. PERRL. Normal extraocular movements. ENT   Head: Left frontal scalp hematoma   Nose: No congestion/rhinnorhea.   Mouth/Throat: Mucous membranes are moist.   Neck: No stridor. Cardiovascular: Normal rate, regular rhythm. Normal and symmetric distal pulses are present in all extremities. No murmurs, rubs, or gallops. Respiratory: Normal respiratory effort without tachypnea nor retractions. Breath sounds are clear and equal bilaterally. No wheezes/rales/rhonchi. Gastrointestinal: Soft and nontender. No distention. No abdominal bruits.  Musculoskeletal: Mild pain with range of motion of both legs. No joint effusions.  No lower extremity tenderness nor edema. Neurologic:  Normal speech and language. No gross focal neurologic deficits  are appreciated. Speech is normal. No gait instability. Skin:  Skin is warm, dry and intact. No rash noted. Psychiatric: Mood and affect are normal. Speech and behavior are normal. Patient exhibits appropriate insight and judgment.  ____________________________________________  ED COURSE:  Pertinent labs & imaging results that were available during my care of the patient were reviewed by me and considered in my medical decision making (see chart for details). We'll obtain CT imaging and reevaluate. ____________________________________________   RADIOLOGY   IMPRESSION: 1. No intracranial trauma. 2. Chronic atrophy and microvascular disease unchanged. 3. No cervical spine fracture. 4. Straightening of the normal cervical lordosis may be secondary to position, muscle spasm, or ligamentous injury.  ____________________________________________  FINAL ASSESSMENT AND PLAN  Fall, minor head injury, cervical strain  Plan: Patient with labs and imaging as dictated above. Patient with negative CT imaging here. She is awake and alert following commands. No other focal  injuries are appreciated. She is stable for outpatient follow-up   Emily Filbert, MD   Emily Filbert, MD 08/12/15 [phone]

## 2015-08-15 ENCOUNTER — Encounter: Payer: Self-pay | Admitting: *Deleted

## 2015-09-12 ENCOUNTER — Encounter: Payer: Self-pay | Admitting: *Deleted

## 2015-10-04 ENCOUNTER — Emergency Department
Admission: EM | Admit: 2015-10-04 | Discharge: 2015-10-04 | Disposition: A | Discharge status: Home / Self Care | Payer: Medicare Other | Attending: Emergency Medicine | Admitting: Emergency Medicine

## 2015-10-04 ENCOUNTER — Encounter: Payer: Self-pay | Admitting: Urgent Care

## 2015-10-04 ENCOUNTER — Emergency Department: Payer: Medicare Other

## 2015-10-04 DIAGNOSIS — W1839XA Other fall on same level, initial encounter: Secondary | ICD-10-CM | POA: Diagnosis not present

## 2015-10-04 DIAGNOSIS — Z7984 Long term (current) use of oral hypoglycemic drugs: Secondary | ICD-10-CM | POA: Insufficient documentation

## 2015-10-04 DIAGNOSIS — S0081XA Abrasion of other part of head, initial encounter: Secondary | ICD-10-CM | POA: Insufficient documentation

## 2015-10-04 DIAGNOSIS — S0083XA Contusion of other part of head, initial encounter: Secondary | ICD-10-CM | POA: Diagnosis not present

## 2015-10-04 DIAGNOSIS — Y9389 Activity, other specified: Secondary | ICD-10-CM | POA: Insufficient documentation

## 2015-10-04 DIAGNOSIS — Z792 Long term (current) use of antibiotics: Secondary | ICD-10-CM | POA: Insufficient documentation

## 2015-10-04 DIAGNOSIS — Z7951 Long term (current) use of inhaled steroids: Secondary | ICD-10-CM | POA: Insufficient documentation

## 2015-10-04 DIAGNOSIS — Y92129 Unspecified place in nursing home as the place of occurrence of the external cause: Secondary | ICD-10-CM | POA: Diagnosis not present

## 2015-10-04 DIAGNOSIS — Z87891 Personal history of nicotine dependence: Secondary | ICD-10-CM | POA: Diagnosis not present

## 2015-10-04 DIAGNOSIS — S0990XA Unspecified injury of head, initial encounter: Secondary | ICD-10-CM | POA: Diagnosis present

## 2015-10-04 DIAGNOSIS — Z79899 Other long term (current) drug therapy: Secondary | ICD-10-CM | POA: Insufficient documentation

## 2015-10-04 DIAGNOSIS — I119 Hypertensive heart disease without heart failure: Secondary | ICD-10-CM | POA: Diagnosis not present

## 2015-10-04 DIAGNOSIS — E1140 Type 2 diabetes mellitus with diabetic neuropathy, unspecified: Secondary | ICD-10-CM | POA: Insufficient documentation

## 2015-10-04 DIAGNOSIS — Z880 Allergy status to penicillin: Secondary | ICD-10-CM | POA: Insufficient documentation

## 2015-10-04 DIAGNOSIS — Y998 Other external cause status: Secondary | ICD-10-CM | POA: Insufficient documentation

## 2015-10-04 HISTORY — DX: Hypo-osmolality and hyponatremia: E87.1

## 2015-10-04 HISTORY — DX: Unspecified dementia, unspecified severity, without behavioral disturbance, psychotic disturbance, mood disturbance, and anxiety: F03.90

## 2015-10-04 LAB — TROPONIN I: Troponin I: <0.03 ng/mL (ref <0.031)

## 2015-10-04 LAB — BASIC METABOLIC PANEL
Anion gap: 7 (ref 5–15)
BUN: 29 mg/dL — ABNORMAL HIGH (ref 6–20)
CHLORIDE: 107 mmol/L (ref 101–111)
CO2: 26 mmol/L (ref 22–32)
CREATININE: 0.83 mg/dL (ref 0.44–1.00)
Calcium: 9.7 mg/dL (ref 8.9–10.3)
GFR calc non Af Amer: >60 mL/min (ref >60)
Glucose, Bld: 129 mg/dL — ABNORMAL HIGH (ref 65–99)
POTASSIUM: 4.4 mmol/L (ref 3.5–5.1)
Sodium: 140 mmol/L (ref 135–145)

## 2015-10-04 LAB — CBC
HEMATOCRIT: 39 % (ref 35.0–47.0)
HEMOGLOBIN: 13.3 g/dL (ref 12.0–16.0)
MCH: 33.6 pg (ref 26.0–34.0)
MCHC: 34.1 g/dL (ref 32.0–36.0)
MCV: 98.4 fL (ref 80.0–100.0)
Platelets: 190 10*3/uL (ref 150–440)
RBC: 3.96 MIL/uL (ref 3.80–5.20)
RDW: 13.3 % (ref 11.5–14.5)
WBC: 7.2 10*3/uL (ref 3.6–11.0)

## 2015-10-04 NOTE — ED Notes (Signed)
Patient observed resting in room with NAD noted. Patient sleeping. No anticipated needs identified. Will continue to monitor.

## 2015-10-04 NOTE — Discharge Instructions (Signed)

## 2015-10-04 NOTE — ED Notes (Signed)
Pt discharged to nursing home via EMS after verbalizing understanding of discharge instructions; nad noted.

## 2015-10-04 NOTE — ED Notes (Addendum)
Patient presents to ED via EMS from [facility]. Patient reported to have an unwitnessed fall; sustained ST to LFA; hematoma with laceration noted to forehead. Patient with headache and c/o pain to bilateral knees.

## 2015-10-04 NOTE — ED Notes (Signed)
Attempted to call report to [facility] at [facility]. 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

## 2015-10-04 NOTE — ED Notes (Addendum)
MD and this RN return to bedside to speak with patient regarding POC.

## 2015-10-04 NOTE — ED Provider Notes (Signed)
[facility] Emergency Department Provider Note  ____________________________________________  Time seen: 3:15 AM  I have reviewed the triage vital signs and the nursing notes.  History of physical exam limited secondary to dementia HISTORY  Chief Complaint Fall    HPI [name] [name] is a 79 y.o. female presents with the EMS after unwitnessed fall patient was found at [facility] beside her bed. Forehead hematoma with abrasion noted     Past Medical History  Diagnosis Date  . Hypertrophic cardiomyopathy (HCC)     a. 11/2013 Echo: EF 55-60%, basal inf HK, sev dil LA, no evidence of HCM.  PASP .  [name] Hypertension   . Situational mixed anxiety and depressive disorder   . Hypercholesterolemia   . Falls frequently     coumadin stopped  . Pulmonary HTN (HCC)     a. has had prior right heart cath in 2010; was felt that most likely due to elevated left sided pressures and would not benefit from vasodilator therapy;  b. 11/2013 Echo: PASP .  [name] Oxygen dependent     "3L 24/7" (08/05/2013)  . COPD (chronic obstructive pulmonary disease) (HCC)   . TIA (transient ischemic attack)   . Atrial fibrillation (HCC)     a. Chronic; No longer on coumadin 2/2 frequent falls.  . CHF (congestive heart failure) (HCC)   . Pacemaker     a. 04/2012 MDT [id number] [employer] PPM, ser #: [id number] H.  . Varicose veins   . History of pneumonia     "couple times; long time ago" (08/05/2013)  . Type II diabetes mellitus (HCC)   . Anemia     "as a child" (08/05/2013)  . GERD (gastroesophageal reflux disease)   . H/O hiatal hernia   . Osteoarthritis   . Arthritis     "all over me; in all my joints" (08/05/2013)  . Coccyx pain   . Gout     "right foot" (08/05/2013)  . Anxiety   . Depression   . Chest pain     a. 08/2011 Myoview: sm, fixed anteroseptal defect (attenuation), no ischemia, EF 62%.  . Dementia   . Hyposmolality   . Hyponatremia     Patient Active  Problem List   Diagnosis Date Noted  . Closed head injury 05/30/2014  . Pulmonary hypertension, moderate to severe (HCC) 05/30/2014  . DM (diabetes mellitus), type 2 with neurological complications (HCC) 05/30/2014  . Hypoglycemia 05/29/2014  . Hypokalemia 05/29/2014  . Subdural hematoma caused by concussion (HCC) 05/28/2014  . hemorrhagic cerebral contusion 05/28/2014  . Dyslipidemia 03/07/2014  . Chronic respiratory failure (HCC) 01/01/2014  . Chest pain   . UTI (urinary tract infection) 11/19/2013  . Compression fracture of L4 lumbar vertebra (HCC) 11/18/2013  . Syncope 11/18/2013  . Protein-calorie malnutrition, severe (HCC) 11/18/2013  . Syncope and collapse 11/17/2013  . Closed compression fracture of L4 lumbar vertebra (HCC) 11/17/2013  . Gait difficulty 10/05/2013  . Fall (on)(from) incline, initial encounter 10/03/2013  . Essential hypertension, benign 10/03/2013  . Unsteady gait 10/03/2013  . Peripheral neuropathy (HCC) 10/03/2013  . Back pain 08/06/2013  . Near syncope 08/06/2013  . Pulmonary hypertension (HCC) 08/05/2013  . Abnormality of gait 05/24/2013  . Acute respiratory distress (HCC) 05/08/2013  . Pulmonary edema, acute (HCC) 05/08/2013  . Benign hypertensive heart disease without heart failure   . Pulmonary HTN (HCC)   . Oxygen dependent   . COPD (chronic obstructive pulmonary disease) (HCC)   . Fatigue  due to cardiopulmonary and deconditioning 04/23/2013  . Memory deficit 02/07/2013  . Memory change 12/11/2012  . TIA (transient ischemic attack) 02/19/2012  . Chest pain at rest 08/05/2011  . Type II or unspecified type diabetes mellitus without mention of complication, uncontrolled 06/12/2011  . Fall 03/22/2011  . Mitral regurgitation 03/13/2011  . Hypertrophic cardiomyopathy (HCC)   . Osteoarthritis   . Situational mixed anxiety and depressive disorder   . Pacemaker 02/26/2011  . Atrial fibrillation (HCC) 01/28/2011  . CHRONIC OBSTRUCTIVE PULMONARY  DISEASE, MODERATE 05/23/2010  . DEPRESSIVE DISORDER NOT ELSEWHERE CLASSIFIED 04/11/2010  . HYPERLIPIDEMIA 05/31/2009  . HYPERTENSION 05/31/2009  . ALLERGIC RHINITIS 05/31/2009  . SHORTNESS OF BREATH 05/31/2009  . Cough 05/31/2009    Past Surgical History  Procedure Laterality Date  . Total knee arthroplasty Right 2011  . Tonsillectomy    . Appendectomy    . Abdominal hysterectomy      "partial" (08/05/2013)  . Dilation and curettage of uterus      "several" (08/05/2013)  . Foot neuroma surgery Right   . Insert / replace / remove pacemaker  1998; 2000's; 2014  . Cardiac catheterization      "more than once" (08/05/2013)  . Back surgery      "bone spur removed; mid back" (08/05/2013)  . Pacemaker generator change N/A 04/24/2012    Procedure: PACEMAKER GENERATOR CHANGE;  Surgeon: Duke SalviaSteven C Klein, MD;  [facility];  Service: Cardiovascular;  Laterality: N/A;    Current Outpatient Rx  Name  Route  Sig  Dispense  Refill  . acetaminophen (TYLENOL) 325 MG tablet   Oral   Take 650 mg by mouth every 6 (six) hours as needed for mild pain or fever.          . benzocaine-menthol (CHLORASEPTIC) 6-10 MG lozenge   Oral   Take 1 lozenge by mouth as needed for sore throat.         [name]. buPROPion (WELLBUTRIN XL) 150 MG 24 hr tablet   Oral   Take 450 mg by mouth daily.          . calcium carbonate (TUMS EX) 750 MG chewable tablet   Oral   Chew 1 tablet by mouth every 6 (six) hours as needed for heartburn.         . Cranberry-Vitamin C-Probiotic (AZO CRANBERRY) 250-30 MG TABS   Oral   Take 1 tablet by mouth daily.         [name]. diltiazem (CARDIZEM CD) 240 MG 24 hr capsule   Oral   Take 1 capsule (240 mg total) by mouth daily.   90 capsule   3   . docusate sodium (COLACE) 100 MG capsule   Oral   Take 100 mg by mouth 2 (two) times daily.         . Fluticasone Furoate-Vilanterol 100-25 MCG/INH AEPB   Inhalation   Inhale 1 puff into the lungs every morning.         [name].  guaifenesin (ROBITUSSIN) 100 MG/5ML syrup   Oral   Take 200 mg by mouth 4 (four) times daily as needed for cough.         [name]. HYDROcodone-acetaminophen (NORCO/VICODIN) 5-325 MG per tablet   Oral   Take 2 tablets by mouth every 4 (four) hours as needed. Patient taking differently: Take 1 tablet by mouth every 4 (four) hours as needed.    16 tablet   0   . losartan (COZAAR) 50 MG tablet  TAKE 1 TABLET BY MOUTH EVERY DAY   30 tablet   1   . metFORMIN (GLUCOPHAGE) 500 MG tablet   Oral   Take 500 mg by mouth daily with breakfast.          . Multiple Vitamins-Iron (MULTIVITAMIN/IRON PO)   Oral   Take 1 tablet by mouth daily.         . nitrofurantoin (MACRODANTIN) 100 MG capsule   Oral   Take 100 mg by mouth daily.         [name] oxybutynin (DITROPAN XL) 15 MG 24 hr tablet   Oral   Take 15 mg by mouth every morning.          . potassium chloride SA (K-DUR,KLOR-CON) 20 MEQ tablet   Oral   Take 20 mEq by mouth daily. In the morning         . prochlorperazine (COMPAZINE) 10 MG tablet   Oral   Take 10 mg by mouth every 6 (six) hours as needed for nausea or vomiting.         . senna (SENOKOT) 8.6 MG TABS tablet   Oral   Take 1 tablet by mouth 2 (two) times daily.         . traZODone (DESYREL) 50 MG tablet   Oral   Take 25 mg by mouth 3 (three) times daily.            Allergies Aspirin; Calan; Crestor; Escitalopram oxalate; Lipitor; Lopressor; Nitroglycerin; Norvasc; Nsaids; Oxycodone; Prednisone; Sulfa antibiotics; Vimovo; and Penicillins  Family History  Problem Relation Age of Onset  . Other Father     died in plane crash  . Cancer Mother     lymphoma-NHL  . Coronary artery disease Grandchild   . Heart disease Maternal Grandmother     Social History Social History  Substance Use Topics  . Smoking status: Former Smoker -- 1.00 packs/day for 40 years    Types: Cigarettes    Quit date: 11/15/2005  . Smokeless tobacco: Never Used  . Alcohol Use:  No    Review of Systems  Constitutional: Negative for fever. Eyes: Negative for visual changes. ENT: Negative for sore throat. Cardiovascular: Negative for chest pain. Respiratory: Negative for shortness of breath. Gastrointestinal: Negative for abdominal pain, vomiting and diarrhea. Genitourinary: Negative for dysuria. Musculoskeletal: Negative for back pain. Skin: Positive for Forehead swelling and abrasion Neurological: Negative for headaches, focal weakness or numbness.   10-point ROS otherwise negative.  ____________________________________________   PHYSICAL EXAM:  VITAL SIGNS: ED Triage Vitals  Enc Vitals Group     BP 10/04/15 0315 157/67 mmHg     Pulse Rate 10/04/15 0315 73     Resp 10/04/15 0315 14     Temp 10/04/15 0315 97.4 F (36.3 C)     Temp Source 10/04/15 0315 Oral     SpO2 10/04/15 0315 99 %     Weight --      Height --      Head Cir --      Peak Flow --      Pain Score 10/04/15 0317 10     Pain Loc --      Pain Edu? --      Excl. in GC? --      Constitutional: Alert and oriented. Well appearing and in no distress. Eyes: Conjunctivae are normal. PERRL. Normal extraocular movements. ENT   Head: Normocephalic and atraumatic.   Nose: No congestion/rhinnorhea.   Mouth/Throat: Mucous membranes are  moist.   Neck: No stridor. Hematological/Lymphatic/Immunilogical: No cervical lymphadenopathy. Cardiovascular: Normal rate, regular rhythm. Normal and symmetric distal pulses are present in all extremities. No murmurs, rubs, or gallops. Respiratory: Normal respiratory effort without tachypnea nor retractions. Breath sounds are clear and equal bilaterally. No wheezes/rales/rhonchi. Gastrointestinal: Soft and nontender. No distention. There is no CVA tenderness. Genitourinary: deferred Musculoskeletal: Nontender with normal range of motion in all extremities. No joint effusions.  No lower extremity tenderness nor edema. Neurologic: No gross  focal neurologic deficits are appreciated. Skin:  Forehead contusion ecchymosis  swelling .  ____________________________________________    LABS (pertinent positives/negatives)  Labs Reviewed  BASIC METABOLIC PANEL - Abnormal; Notable for the following:    Glucose, Bld 129 (*)    BUN 29 (*)    All other components within normal limits  CBC  TROPONIN I     ____________________________________________   EKG  ED ECG REPORT I, BROWN, [place] N, the attending physician, personally viewed and interpreted this ECG.   Date: 10/04/2015  EKG Time: 3:17 AM  Rate: 72  Rhythm: Atrial flutter  Axis: None  Intervals: Normal  ST&T Change: None   ____________________________________________    RADIOLOGY    INITIAL IMPRESSION / ASSESSMENT AND PLAN / ED COURSE  Pertinent labs & imaging results that were available during my care of the patient were reviewed by me and considered in my medical decision making (see chart for details).    ____________________________________________   FINAL CLINICAL IMPRESSION(S) / ED DIAGNOSES  Final diagnoses:  Forehead contusion, initial encounter      Darci Current, MD 10/06/15 [phone]

## 2015-10-14 IMAGING — CT CT CERVICAL SPINE W/O CM
3 of 4 series · 13 of 27 positions shown, 16 images · non-contrast
Comparison: Head CT scan 11/19/2013. Head and cervical spine CT
scan 11/17/2013.

CLINICAL DATA: Status post fall.  Headache and neck pain.

EXAM:
CT HEAD WITHOUT CONTRAST
CT CERVICAL SPINE WITHOUT CONTRAST
TECHNIQUE: Multidetector CT imaging of the head and cervical spine was
performed following the standard protocol without intravenous
contrast. Multiplanar CT image reconstructions of the cervical spine
were also generated.

[Series 5: c_spine 2.0 i40s 3 · axial · 0.42mm/px · z∈[-207,-131]mm · 3 of 76 slices shown]
[im 19/76  bone]
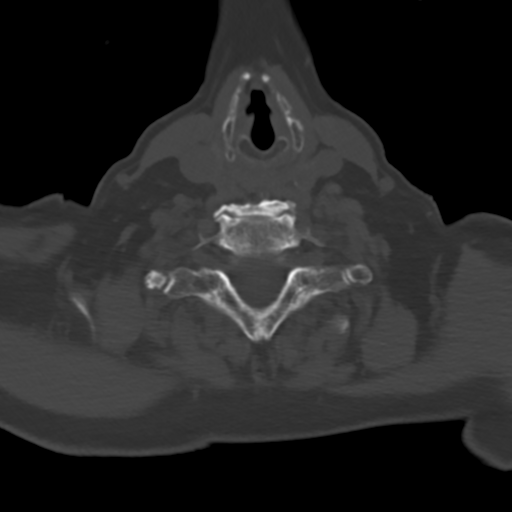
[im 38/76  bone]
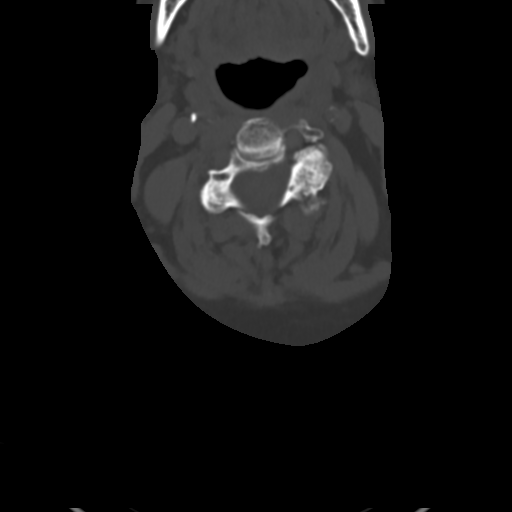
[im 57/76  bone]
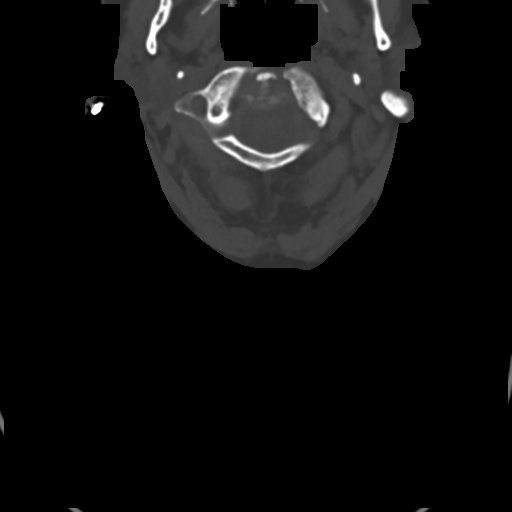

[Series 8: sagittals · sagittal · 0.32mm/px · 5 of 41 slices shown, 6 images]
[im 14/41  bone]
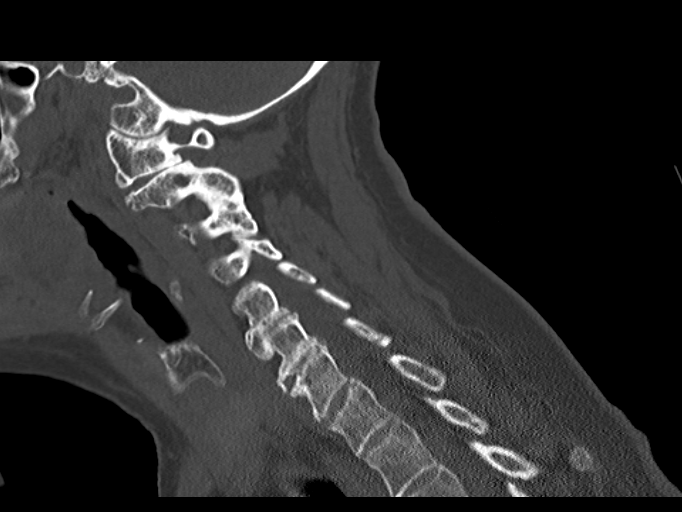
[im 17/41  bone]
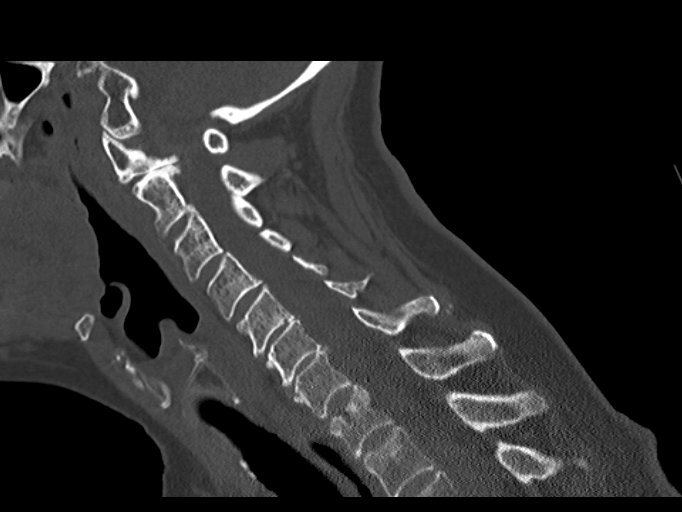
[im 21/41  soft-tissue]
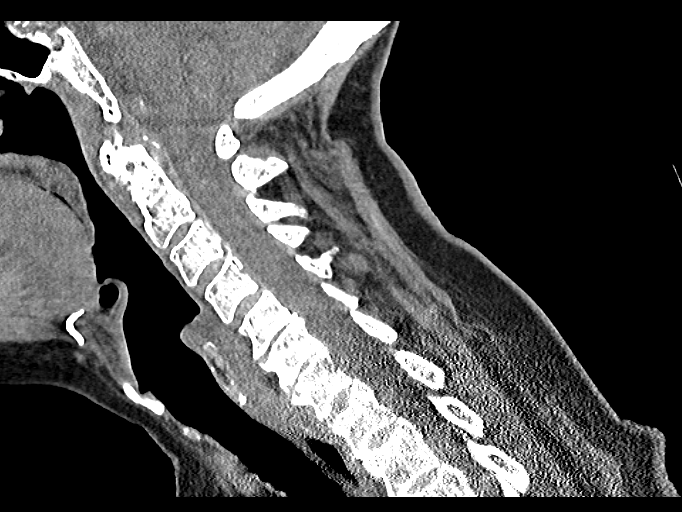
[im 21/41  bone]
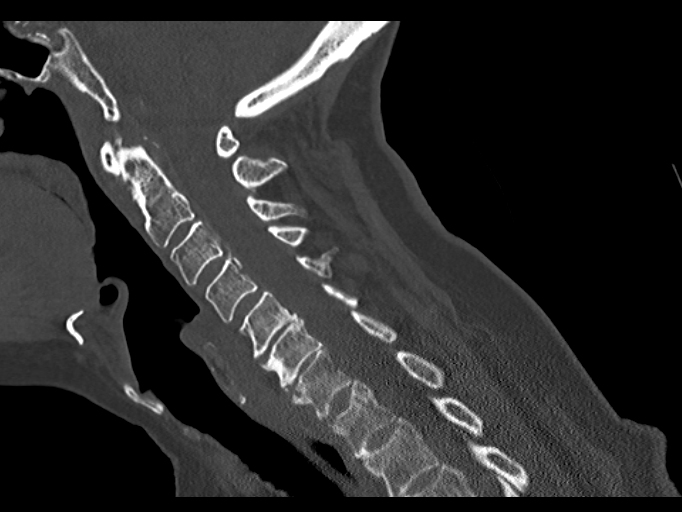
[im 24/41  bone]
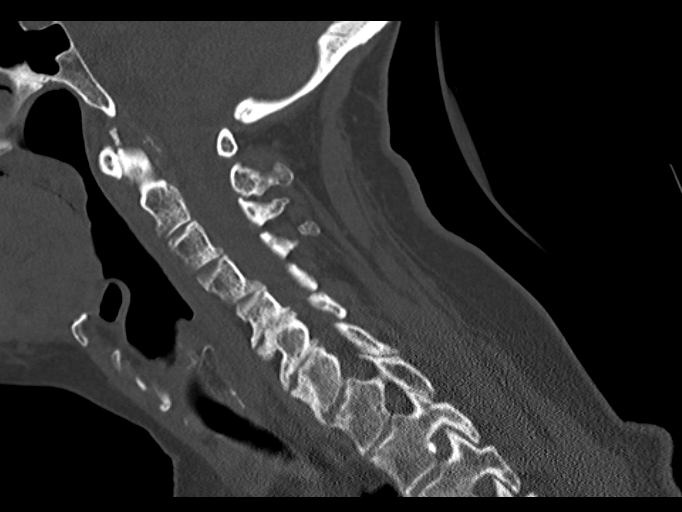
[im 27/41  bone]
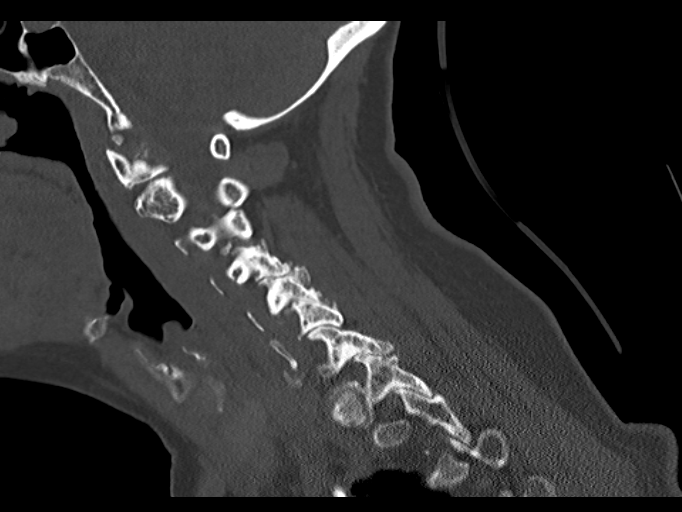

[Series 9: orthogonals · axial · 0.37mm/px · z∈[-284,-167]mm · 5 of 111 slices shown, 7 images]
[im 19/111  soft-tissue]
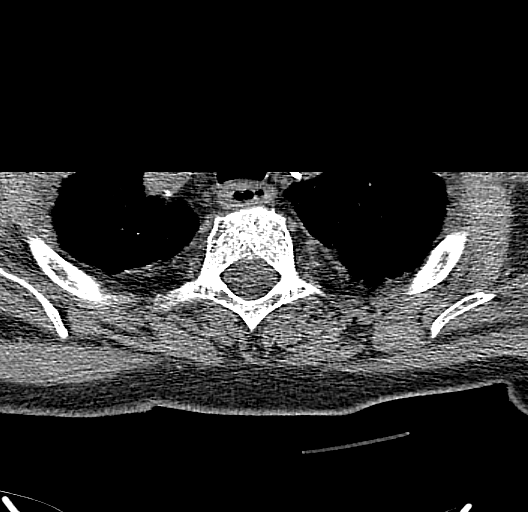
[im 19/111  bone]
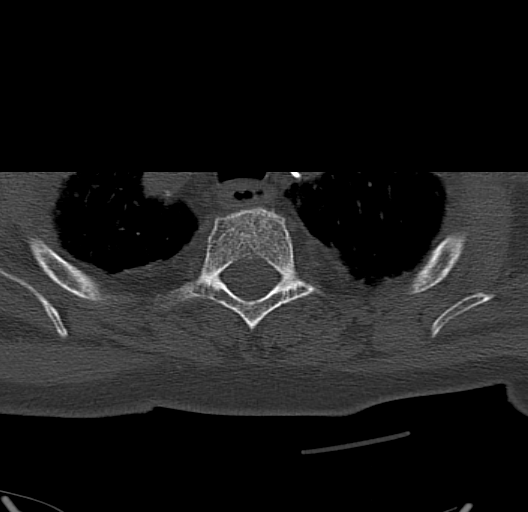
[im 37/111  bone]
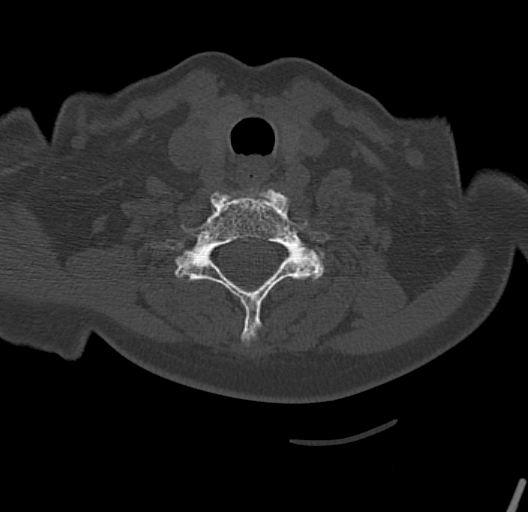
[im 56/111  bone]
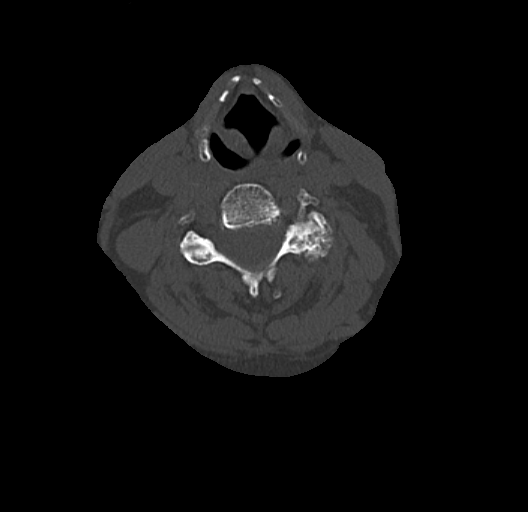
[im 74/111  bone]
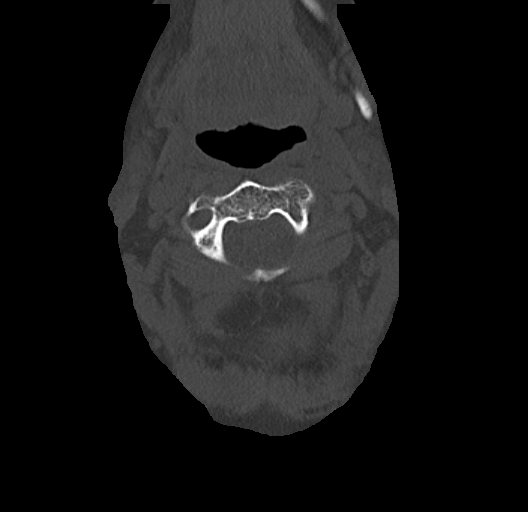
[im 92/111  soft-tissue]
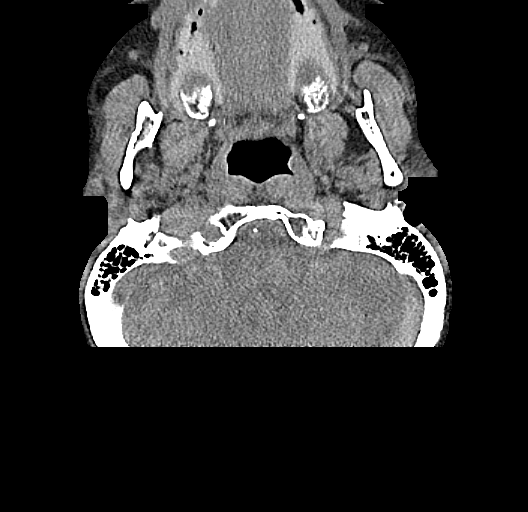
[im 92/111  bone]
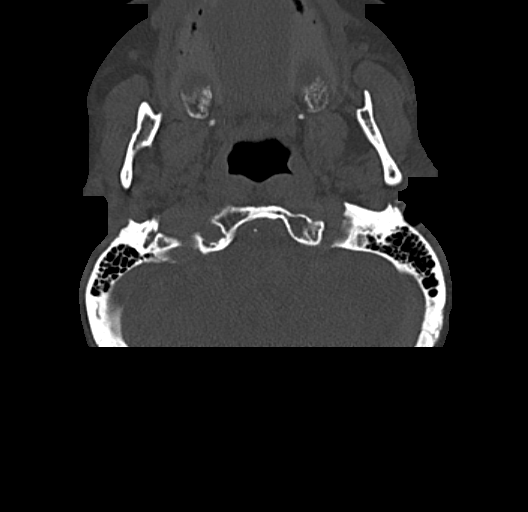

[13 of 27 positions shown; findings below may reference images not displayed]

FINDINGS: CT HEAD FINDINGS

There is cortical atrophy and chronic microvascular ischemic change.
Punctate calcification and high right parietal lobe is unchanged.
There is no evidence of acute intracranial abnormality including
infarct, hemorrhage, mass lesion, mass effect, midline shift or
abnormal extra-axial fluid collection. No pneumocephalus or
hydrocephalus. The calvarium is intact. Imaged paranasal sinuses and
mastoid air cells are clear. Atherosclerosis is noted.

CT CERVICAL SPINE FINDINGS

No fracture or malalignment of the cervical spine is identified.
Degenerative disc disease appearing worst at C5-6 and C6-7 is seen.
Multilevel advanced facet arthropathy is noted. Lung apices are
clear.
IMPRESSION: No acute finding head or cervical spine. Stable compared to prior
exams.

## 2015-10-17 IMAGING — CR DG TIBIA/FIBULA 2V*R*
4 series · 4 of 4 positions shown · non-contrast
Comparison: Right knee series of December 21, 2011

CLINICAL DATA: Status post fall with mid tibia and fibular region
bruising

EXAM:
RIGHT TIBIA AND FIBULA - 2 VIEW

[x tib-fib ap right (1 of 2)]
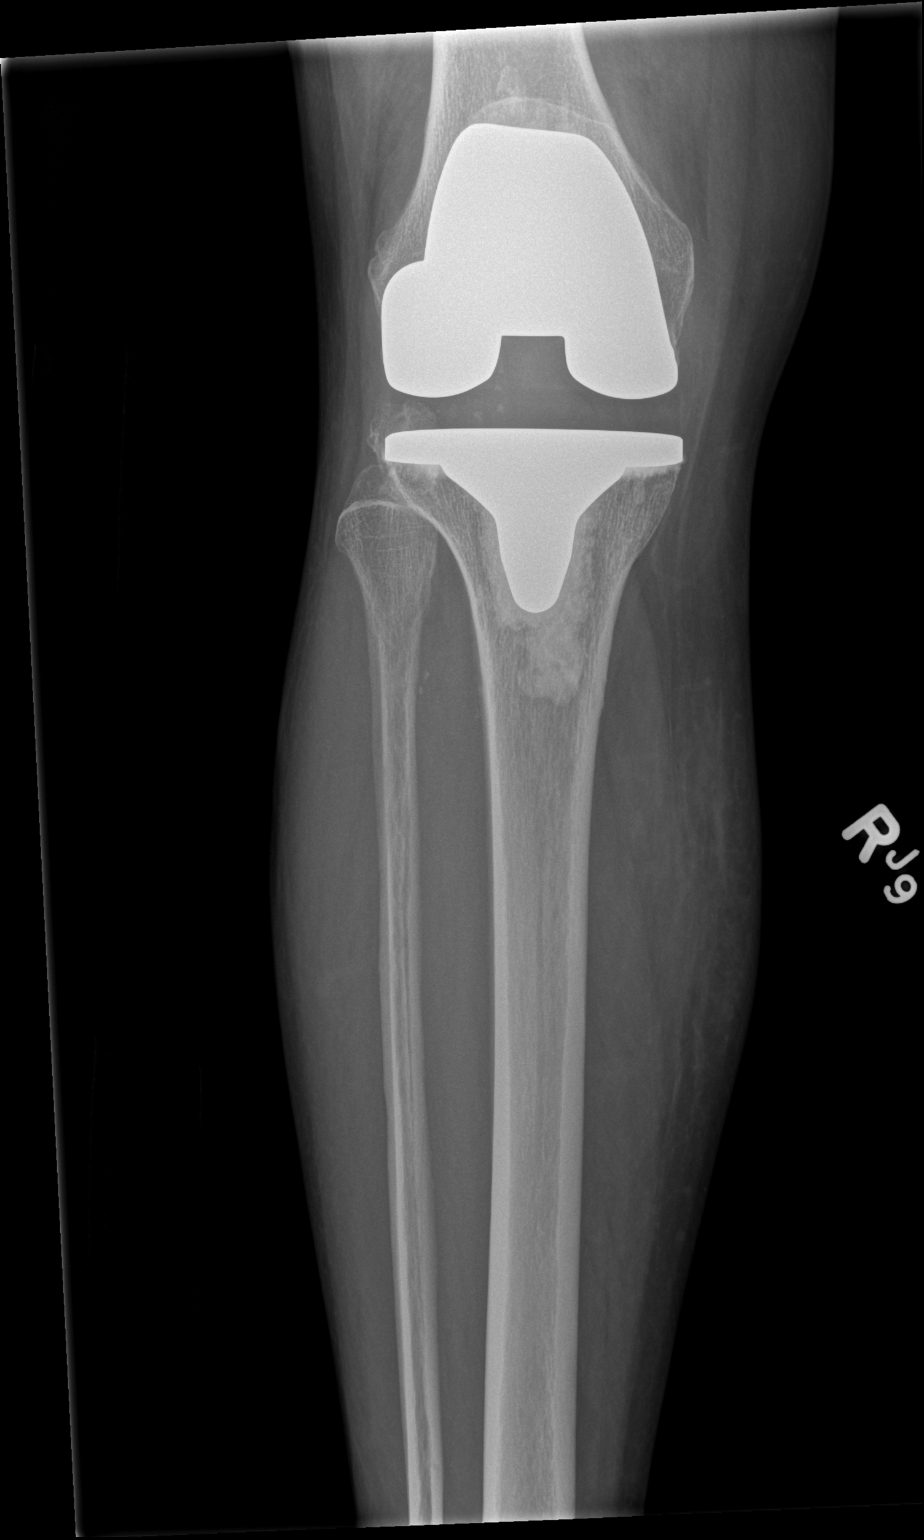

[x tib-fib ap right (2 of 2)]
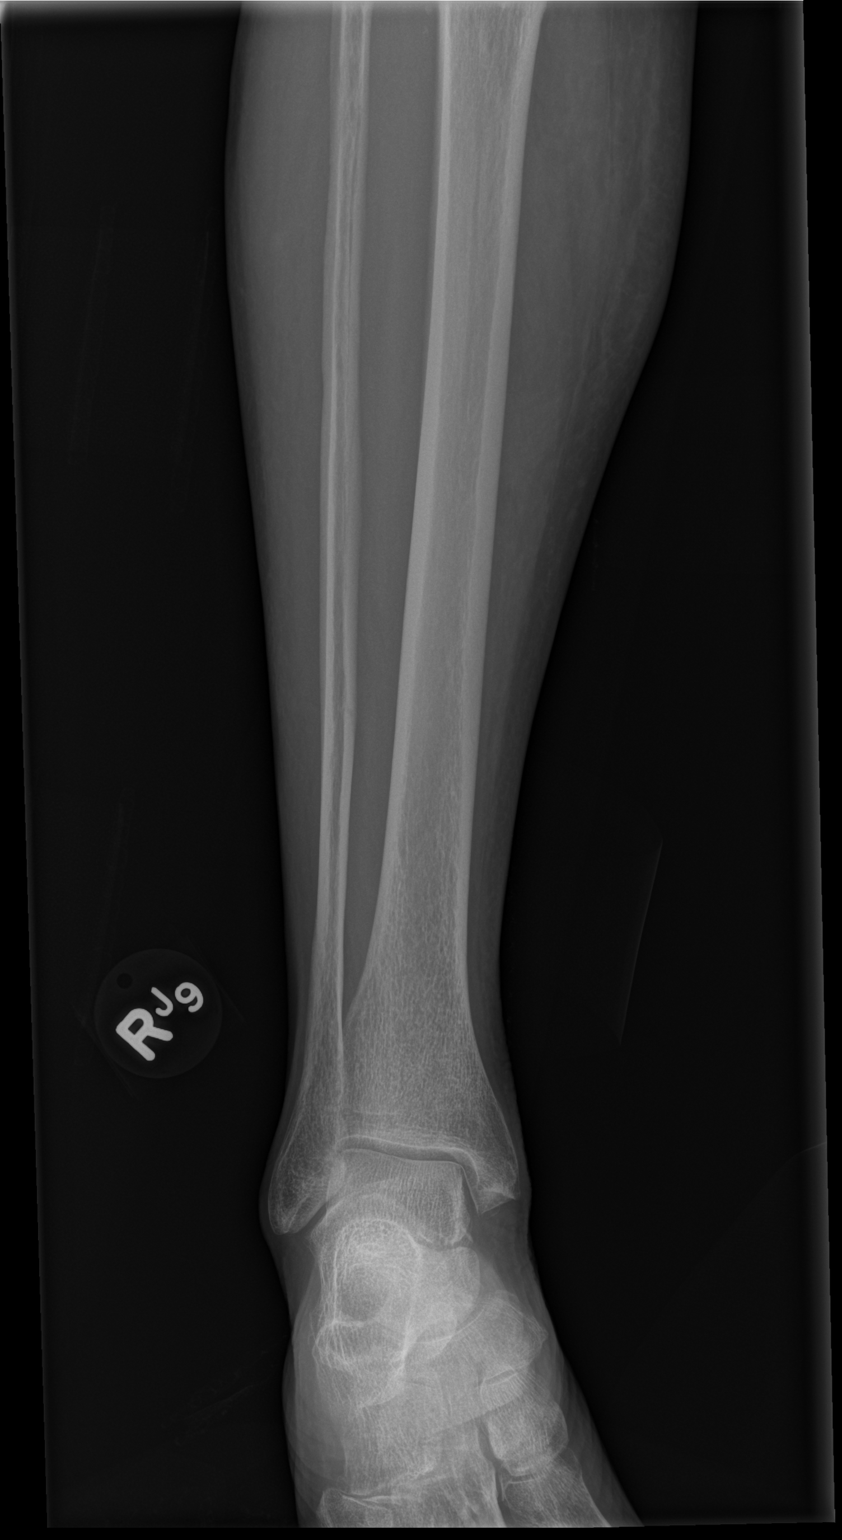

[x tib-fib lat right (1 of 2)]
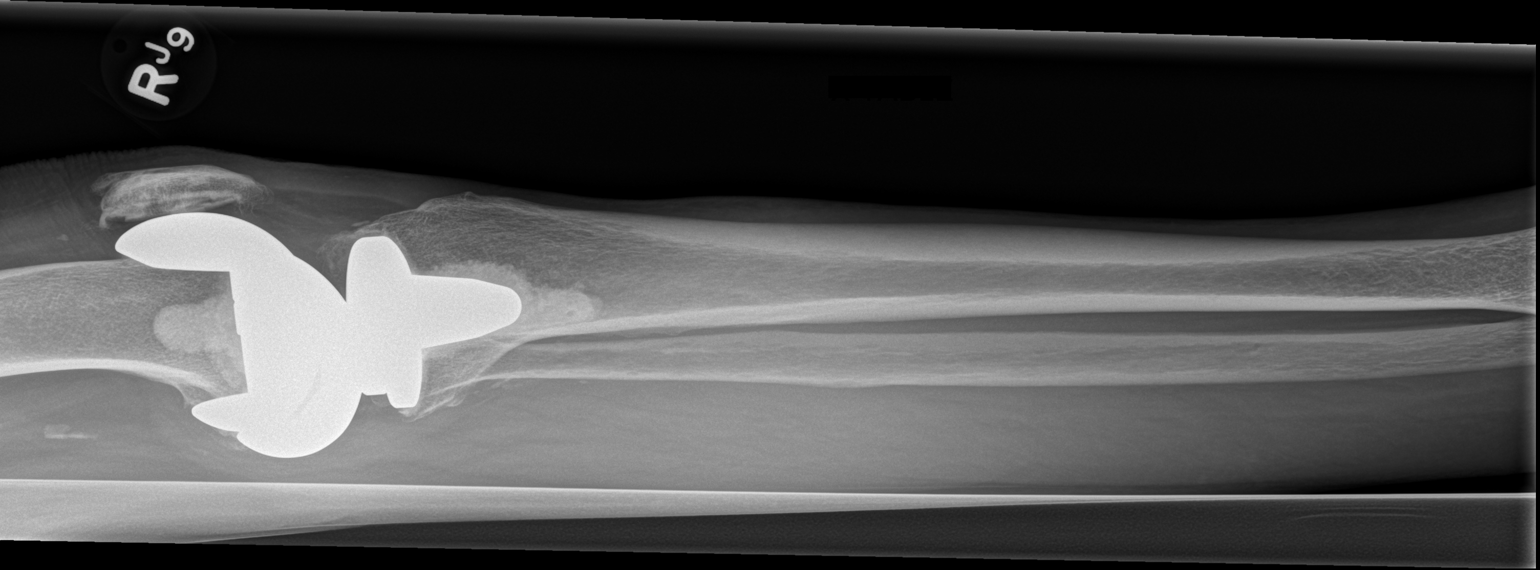

[x tib-fib lat right (2 of 2)]
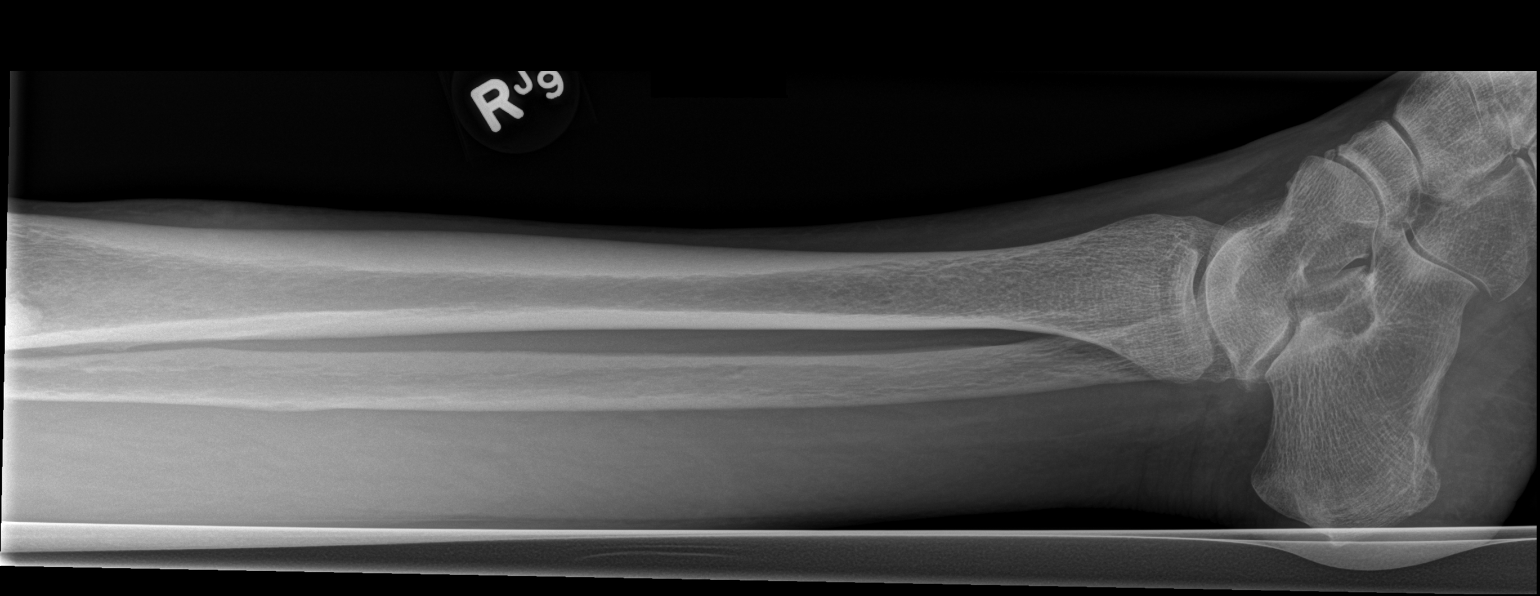

[4 of 4 positions shown; findings below may reference images not displayed]

FINDINGS: The shafts of the right tibia and fibula are intact. The interface
of the native bone with the tibial component of the knee prosthesis
is normal. The ankle joint mortise is preserved. The overlying soft
tissues of the knee exhibit no foreign bodies. Minimal soft tissue
swelling anteriorly over the proximal shin is present.
IMPRESSION: There is no acute bony abnormality of the right tibia or fibula.

## 2015-10-17 IMAGING — CT CT HEAD W/O CM
2 series · 17 of 30 positions shown, 20 images · non-contrast
Comparison: 04/16/2014

CLINICAL DATA: Fall

EXAM:
CT HEAD WITHOUT CONTRAST
TECHNIQUE: Contiguous axial images were obtained from the base of the skull
through the vertex without intravenous contrast.

[Series 2: head w/o · axial · non-contrast · 0.47mm/px · z∈[-208,-88]mm · 9 of 30 slices shown, 12 images]
[im 3/30  brain]
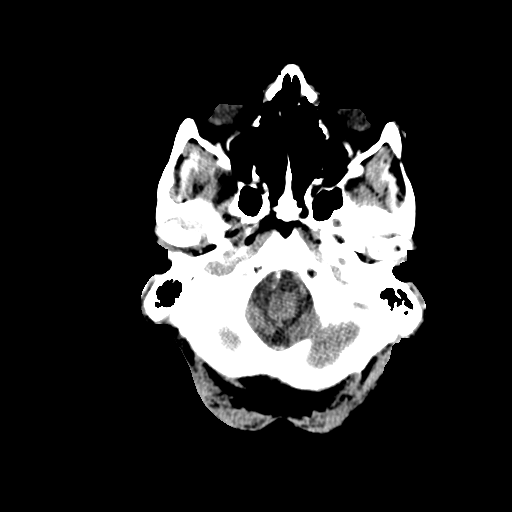
[im 3/30  bone]
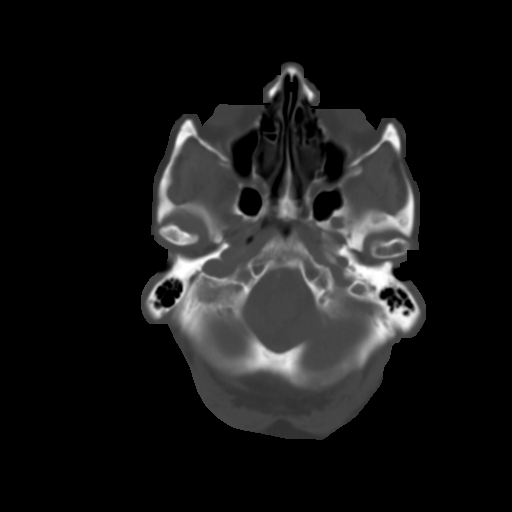
[im 6/30  brain]
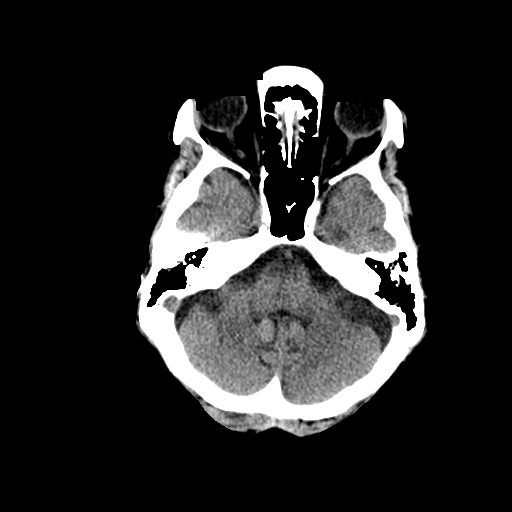
[im 9/30  brain]
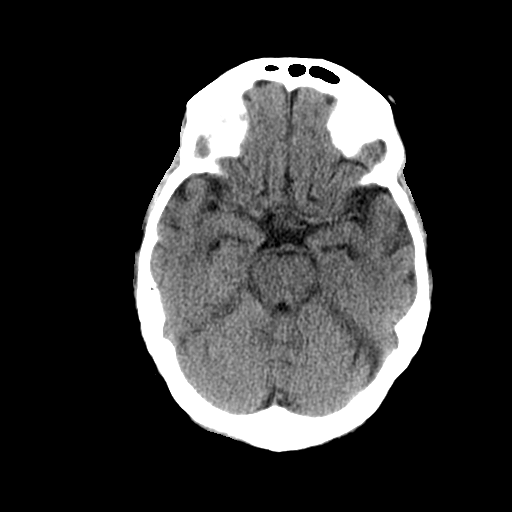
[im 12/30  brain]
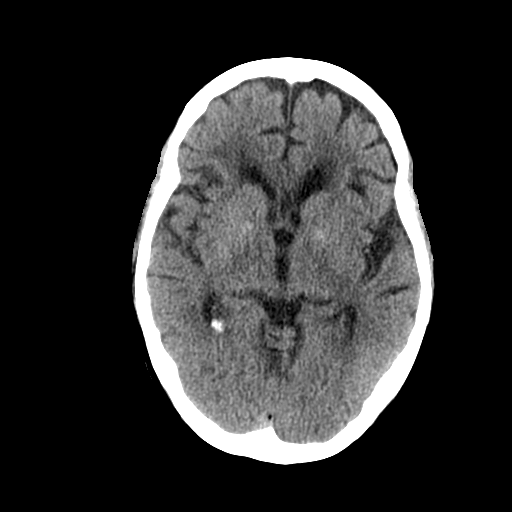
[im 15/30  brain]
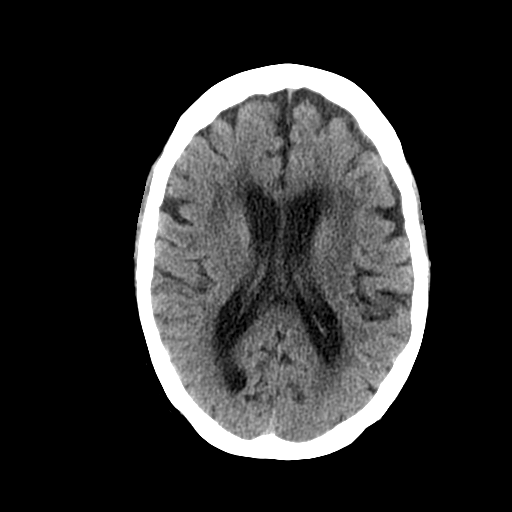
[im 15/30  bone]
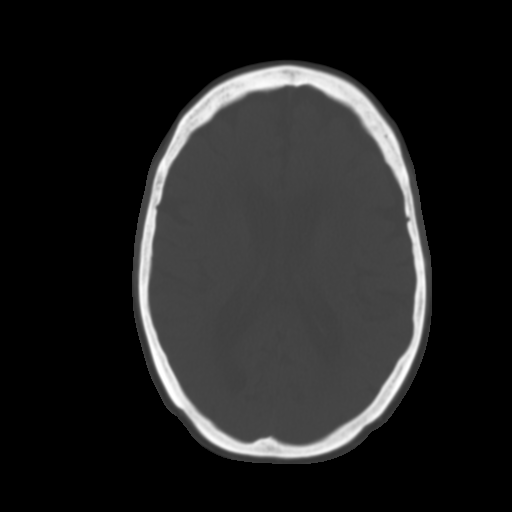
[im 18/30  brain]
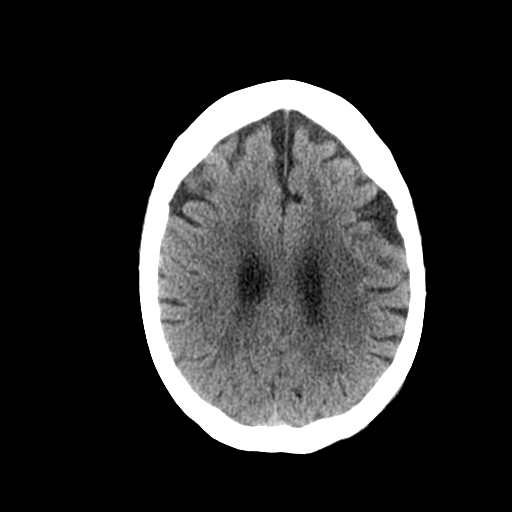
[im 21/30  brain]
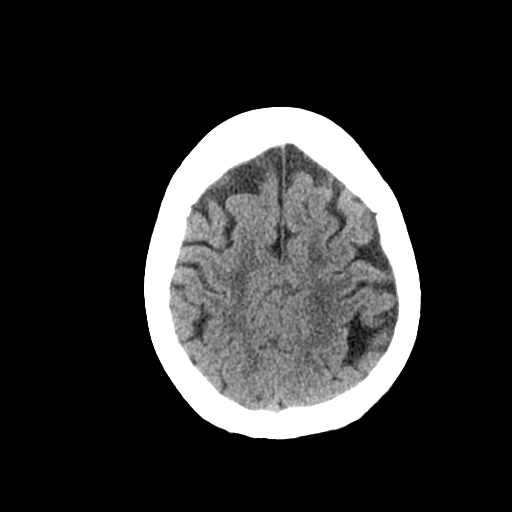
[im 24/30  brain]
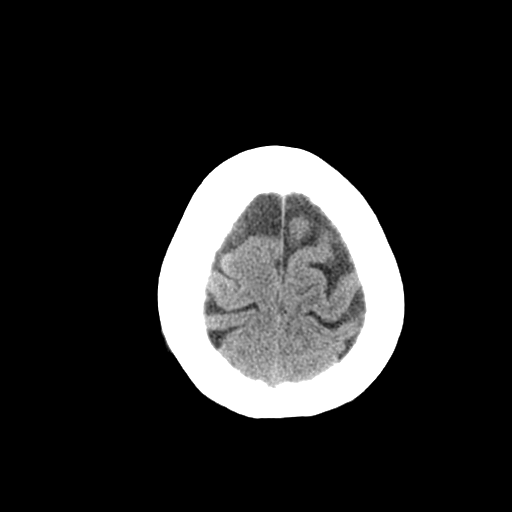
[im 27/30  brain]
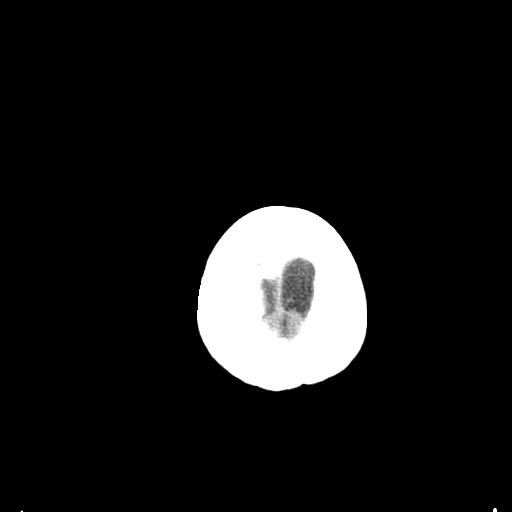
[im 27/30  bone]
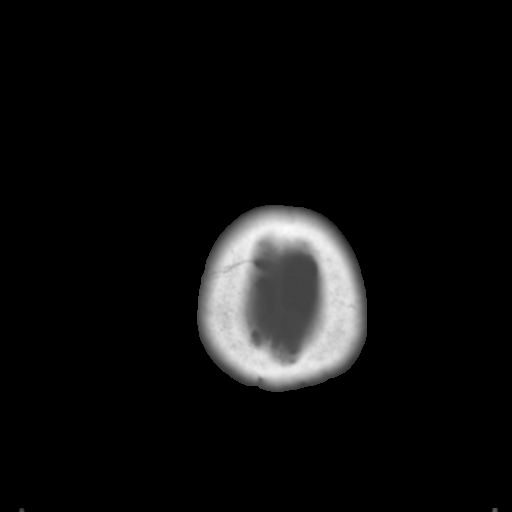

[Series 3: bone windows · axial · 0.47mm/px · z∈[-203,-89]mm · 8 of 50 slices shown]
[im 6/50  bone]
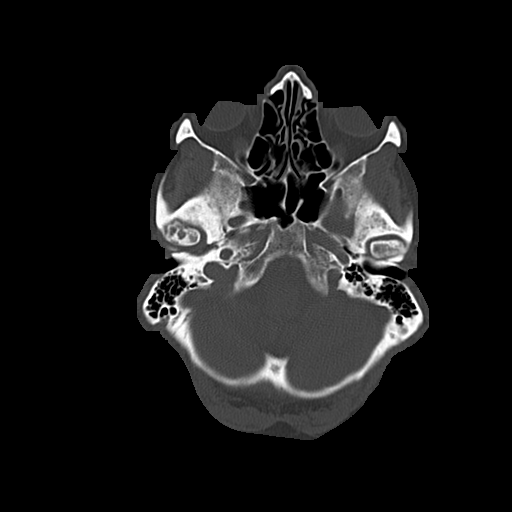
[im 11/50  bone]
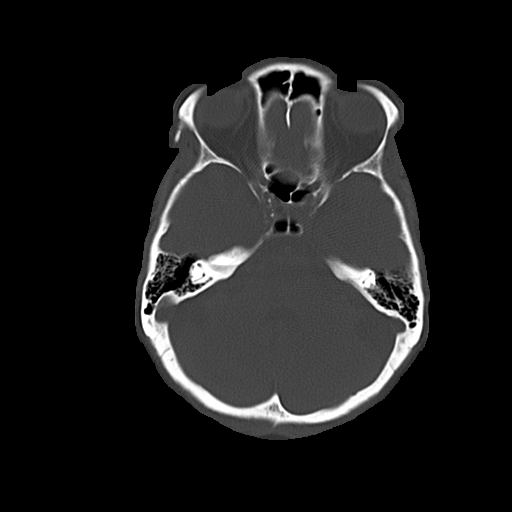
[im 17/50  bone]
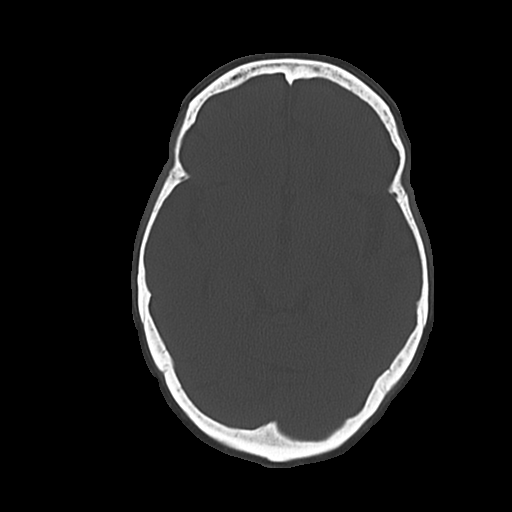
[im 22/50  bone]
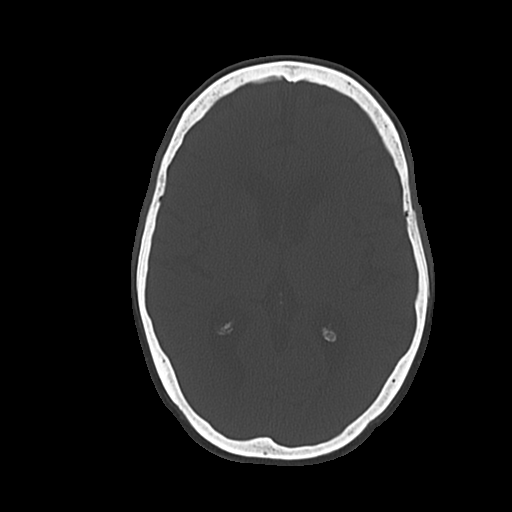
[im 28/50  bone]
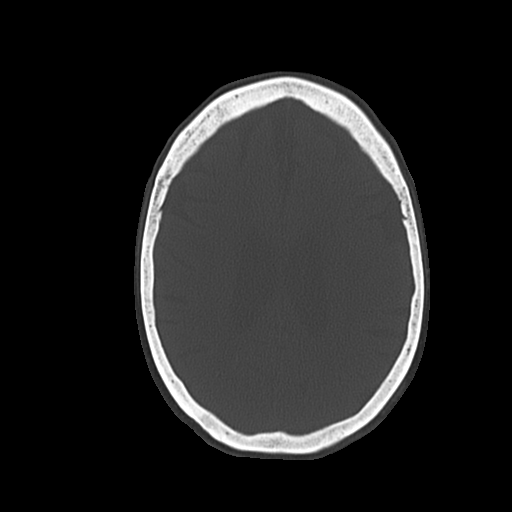
[im 33/50  bone]
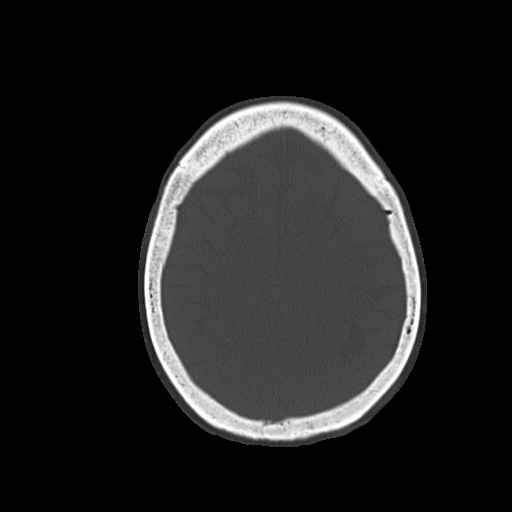
[im 39/50  bone]
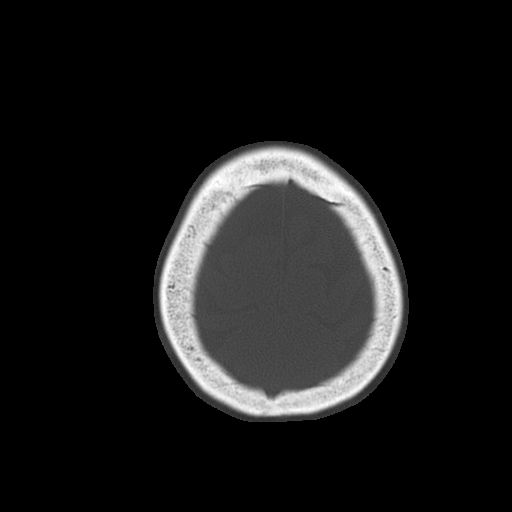
[im 44/50  bone]
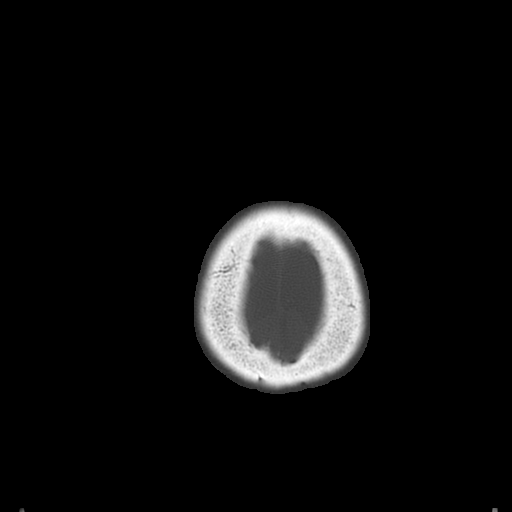

[17 of 30 positions shown; findings below may reference images not displayed]

FINDINGS: No skull fracture is noted. Paranasal sinuses and mastoid air cells
are unremarkable.

Stable cerebral atrophy. Stable periventricular and patchy
subcortical chronic white matter disease. No acute cortical
infarction. No mass lesion is noted on this unenhanced scan. No
intraventricular hemorrhage. No intracranial hemorrhage, mass effect
or midline shift.
IMPRESSION: No acute intracranial abnormality. Stable atrophy and chronic white
matter disease. No definite acute cortical infarction.

## 2015-11-02 ENCOUNTER — Emergency Department
Admission: EM | Admit: 2015-11-02 | Discharge: 2015-11-03 | Disposition: A | Discharge status: Home / Self Care | Attending: Emergency Medicine | Admitting: Emergency Medicine

## 2015-11-02 ENCOUNTER — Encounter: Payer: Self-pay | Admitting: Emergency Medicine

## 2015-11-02 ENCOUNTER — Emergency Department

## 2015-11-02 DIAGNOSIS — Y9289 Other specified places as the place of occurrence of the external cause: Secondary | ICD-10-CM | POA: Insufficient documentation

## 2015-11-02 DIAGNOSIS — R1013 Epigastric pain: Secondary | ICD-10-CM | POA: Diagnosis not present

## 2015-11-02 DIAGNOSIS — I499 Cardiac arrhythmia, unspecified: Secondary | ICD-10-CM | POA: Insufficient documentation

## 2015-11-02 DIAGNOSIS — Z87891 Personal history of nicotine dependence: Secondary | ICD-10-CM | POA: Insufficient documentation

## 2015-11-02 DIAGNOSIS — Z880 Allergy status to penicillin: Secondary | ICD-10-CM | POA: Insufficient documentation

## 2015-11-02 DIAGNOSIS — Z7951 Long term (current) use of inhaled steroids: Secondary | ICD-10-CM | POA: Insufficient documentation

## 2015-11-02 DIAGNOSIS — Y9389 Activity, other specified: Secondary | ICD-10-CM | POA: Insufficient documentation

## 2015-11-02 DIAGNOSIS — T424X1A Poisoning by benzodiazepines, accidental (unintentional), initial encounter: Secondary | ICD-10-CM | POA: Insufficient documentation

## 2015-11-02 DIAGNOSIS — Y998 Other external cause status: Secondary | ICD-10-CM | POA: Diagnosis not present

## 2015-11-02 DIAGNOSIS — Z7984 Long term (current) use of oral hypoglycemic drugs: Secondary | ICD-10-CM | POA: Diagnosis not present

## 2015-11-02 DIAGNOSIS — R1011 Right upper quadrant pain: Secondary | ICD-10-CM | POA: Diagnosis not present

## 2015-11-02 DIAGNOSIS — T391X1A Poisoning by 4-Aminophenol derivatives, accidental (unintentional), initial encounter: Secondary | ICD-10-CM | POA: Insufficient documentation

## 2015-11-02 DIAGNOSIS — E1149 Type 2 diabetes mellitus with other diabetic neurological complication: Secondary | ICD-10-CM | POA: Insufficient documentation

## 2015-11-02 DIAGNOSIS — I10 Essential (primary) hypertension: Secondary | ICD-10-CM | POA: Insufficient documentation

## 2015-11-02 DIAGNOSIS — Z79899 Other long term (current) drug therapy: Secondary | ICD-10-CM | POA: Diagnosis not present

## 2015-11-02 DIAGNOSIS — T402X1A Poisoning by other opioids, accidental (unintentional), initial encounter: Secondary | ICD-10-CM | POA: Insufficient documentation

## 2015-11-02 DIAGNOSIS — T4271XA Poisoning by unspecified antiepileptic and sedative-hypnotic drugs, accidental (unintentional), initial encounter: Secondary | ICD-10-CM

## 2015-11-02 DIAGNOSIS — X58XXXA Exposure to other specified factors, initial encounter: Secondary | ICD-10-CM | POA: Insufficient documentation

## 2015-11-02 DIAGNOSIS — Z792 Long term (current) use of antibiotics: Secondary | ICD-10-CM | POA: Insufficient documentation

## 2015-11-02 LAB — URINALYSIS COMPLETE WITH MICROSCOPIC (ARMC ONLY)
BACTERIA UA: NONE SEEN
Bilirubin Urine: NEGATIVE
Glucose, UA: NEGATIVE mg/dL
HGB URINE DIPSTICK: NEGATIVE
LEUKOCYTES UA: NEGATIVE
NITRITE: NEGATIVE
PH: 6 (ref 5.0–8.0)
PROTEIN: NEGATIVE mg/dL
SPECIFIC GRAVITY, URINE: 1.016 (ref 1.005–1.030)
Squamous Epithelial / LPF: NONE SEEN

## 2015-11-02 LAB — CBC WITH DIFFERENTIAL/PLATELET
Basophils Absolute: 0 10*3/uL (ref 0–0.1)
Basophils Relative: 0 %
EOS PCT: 2 %
Eosinophils Absolute: 0.2 10*3/uL (ref 0–0.7)
HCT: 39.6 % (ref 35.0–47.0)
HEMOGLOBIN: 13.3 g/dL (ref 12.0–16.0)
LYMPHS ABS: 1.3 10*3/uL (ref 1.0–3.6)
LYMPHS PCT: 16 %
MCH: 32.7 pg (ref 26.0–34.0)
MCHC: 33.7 g/dL (ref 32.0–36.0)
MCV: 97.2 fL (ref 80.0–100.0)
MONOS PCT: 6 %
Monocytes Absolute: 0.5 10*3/uL (ref 0.2–0.9)
Neutro Abs: 6.3 10*3/uL (ref 1.4–6.5)
Neutrophils Relative %: 76 %
Platelets: 185 10*3/uL (ref 150–440)
RBC: 4.07 MIL/uL (ref 3.80–5.20)
RDW: 12.8 % (ref 11.5–14.5)
WBC: 8.4 10*3/uL (ref 3.6–11.0)

## 2015-11-02 LAB — COMPREHENSIVE METABOLIC PANEL
ALK PHOS: 74 U/L (ref 38–126)
ALT: 17 U/L (ref 14–54)
ANION GAP: 9 (ref 5–15)
AST: 30 U/L (ref 15–41)
Albumin: 4.1 g/dL (ref 3.5–5.0)
BILIRUBIN TOTAL: 0.9 mg/dL (ref 0.3–1.2)
BUN: 23 mg/dL — ABNORMAL HIGH (ref 6–20)
CO2: 29 mmol/L (ref 22–32)
CREATININE: 1.04 mg/dL — AB (ref 0.44–1.00)
Calcium: 9.8 mg/dL (ref 8.9–10.3)
Chloride: 103 mmol/L (ref 101–111)
GFR, EST AFRICAN AMERICAN: 59 mL/min — AB (ref >60)
GFR, EST NON AFRICAN AMERICAN: 50 mL/min — AB (ref >60)
Glucose, Bld: 139 mg/dL — ABNORMAL HIGH (ref 65–99)
Potassium: 3.7 mmol/L (ref 3.5–5.1)
Sodium: 141 mmol/L (ref 135–145)
Total Protein: 6.8 g/dL (ref 6.5–8.1)

## 2015-11-02 LAB — TROPONIN I: Troponin I: 0.03 ng/mL (ref <0.031)

## 2015-11-02 LAB — LACTIC ACID, PLASMA: Lactic Acid, Venous: 1.5 mmol/L (ref 0.5–2.0)

## 2015-11-02 LAB — LIPASE, BLOOD: Lipase: 23 U/L (ref 11–51)

## 2015-11-02 IMAGING — CT CT CERVICAL SPINE W/O CM
3 of 4 series · 16 of 33 positions shown, 19 images · non-contrast
Comparison: 04/16/2014

CLINICAL DATA: Recent traumatic injury with pain

EXAM:
CT HEAD WITHOUT CONTRAST
CT CERVICAL SPINE WITHOUT CONTRAST
TECHNIQUE: Multidetector CT imaging of the head and cervical spine was
performed following the standard protocol without intravenous
contrast. Multiplanar CT image reconstructions of the cervical spine
were also generated.

[Series 5: c_spine 2.0 i40s 3 · axial · 0.32mm/px · z∈[-260,-156]mm · 8 of 68 slices shown, 10 images]
[im 8/68  soft-tissue]
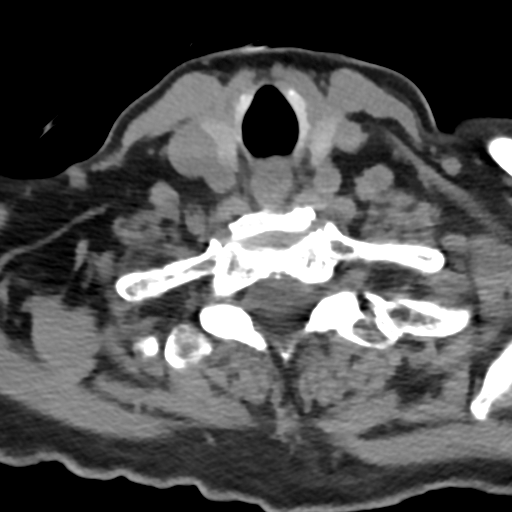
[im 8/68  bone]
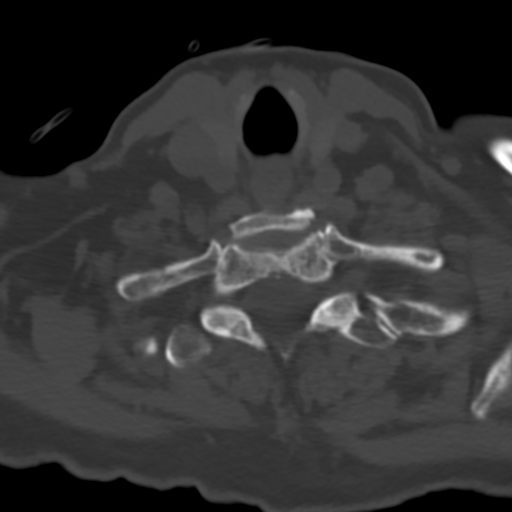
[im 15/68  bone]
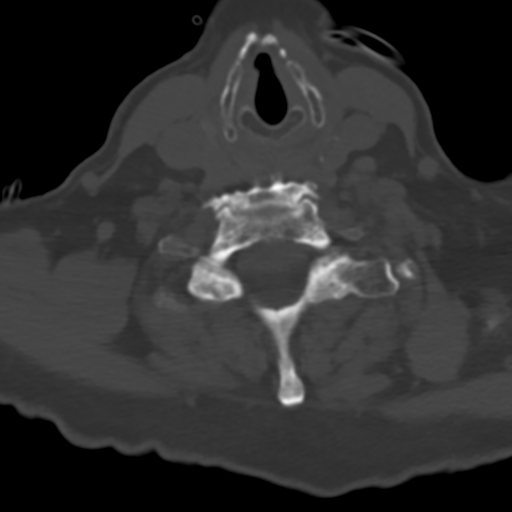
[im 23/68  bone]
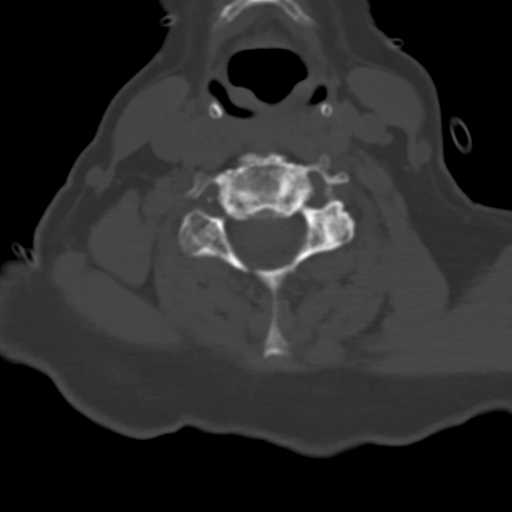
[im 30/68  bone]
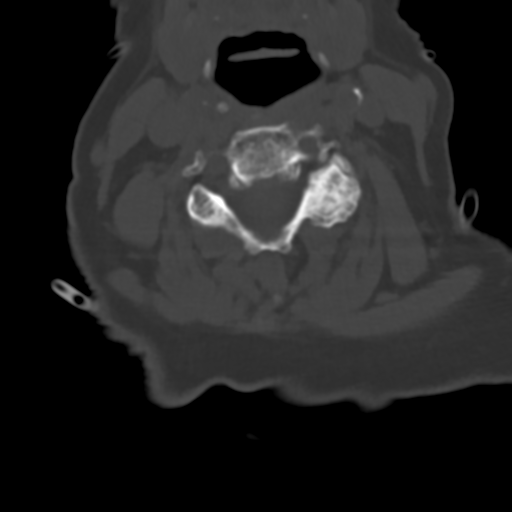
[im 38/68  soft-tissue]
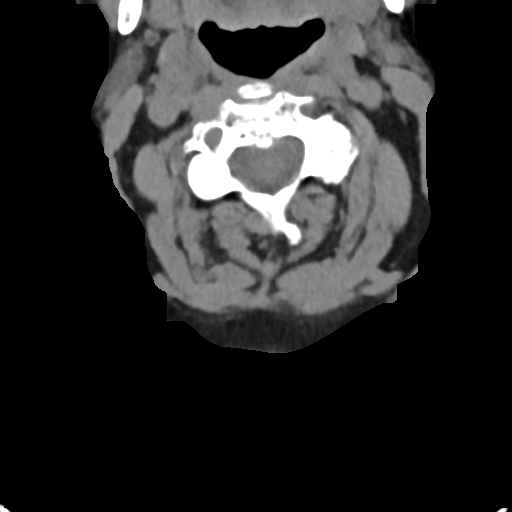
[im 38/68  bone]
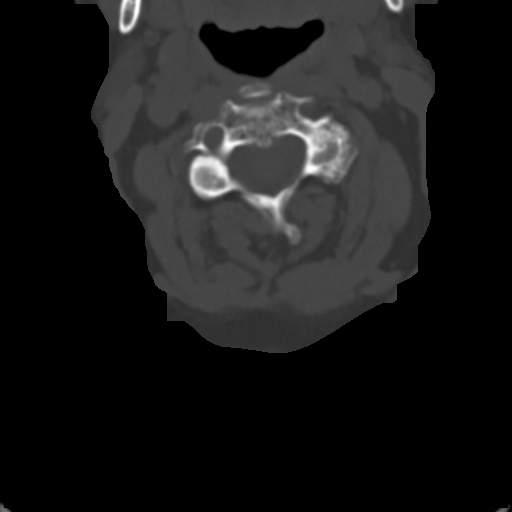
[im 45/68  bone]
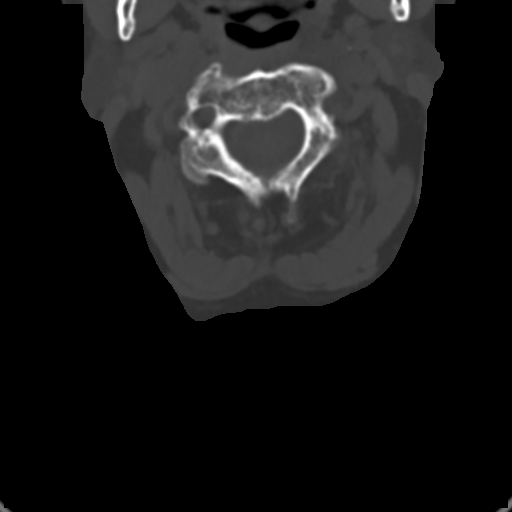
[im 53/68  bone]
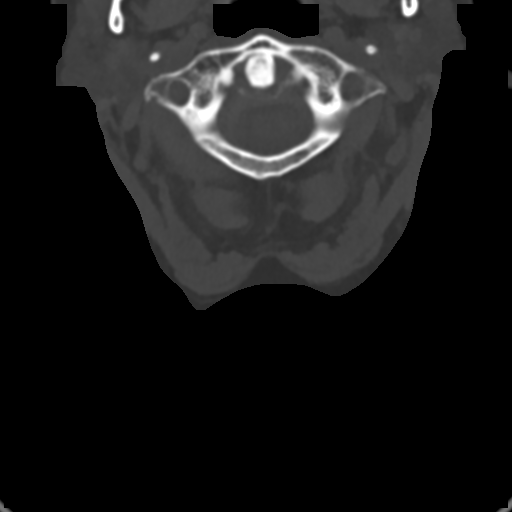
[im 60/68  bone]
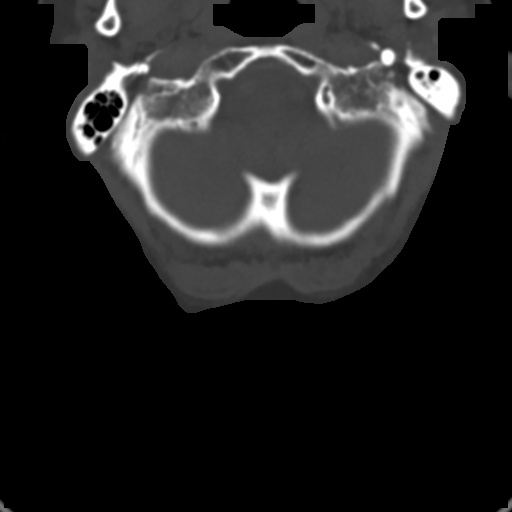

[Series 7: coronals · coronal · 0.35mm/px · 3 of 38 slices shown]
[im 8/38  bone]
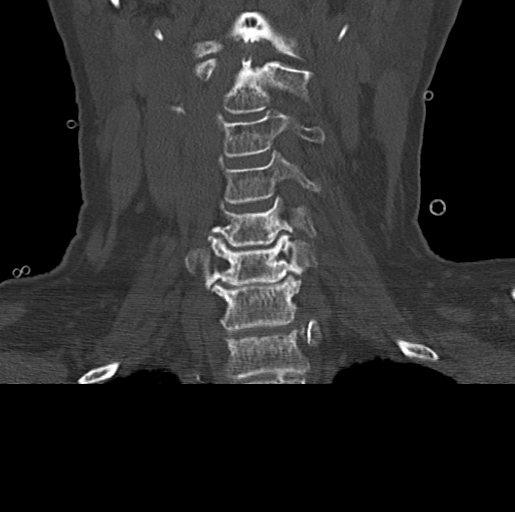
[im 15/38  bone]
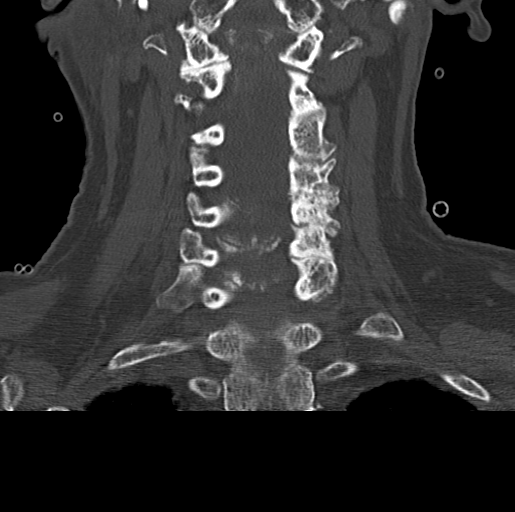
[im 23/38  bone]
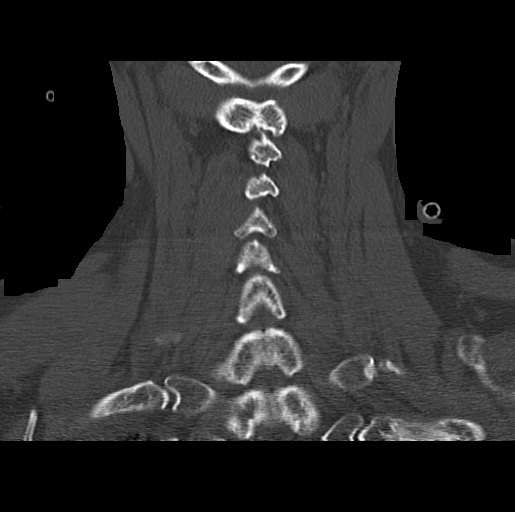

[Series 8: sagittals · sagittal · 0.30mm/px · 5 of 83 slices shown, 6 images]
[im 28/83  bone]
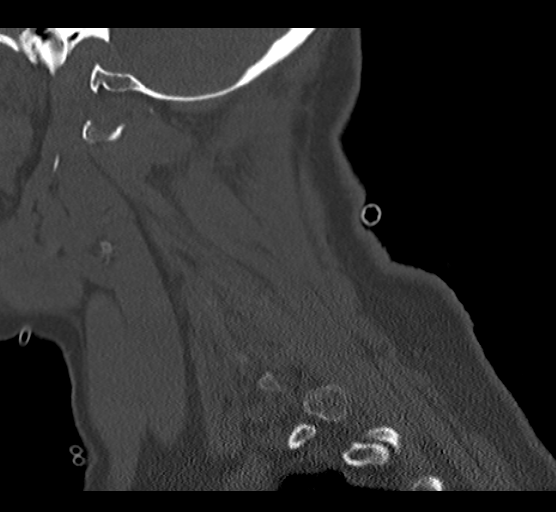
[im 35/83  bone]
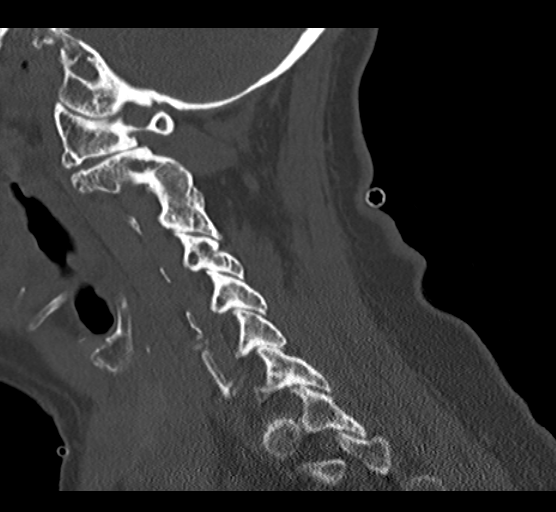
[im 42/83  soft-tissue]
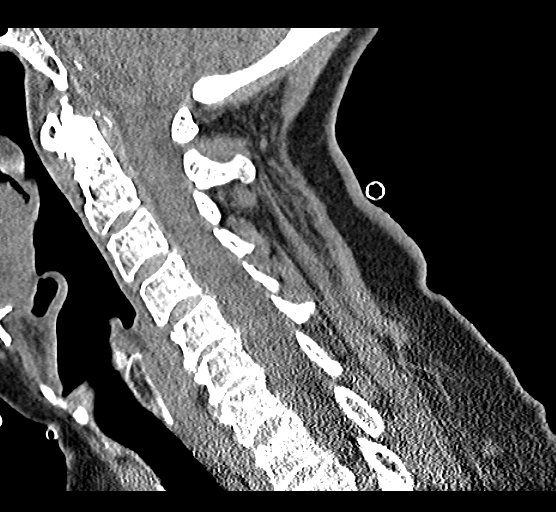
[im 42/83  bone]
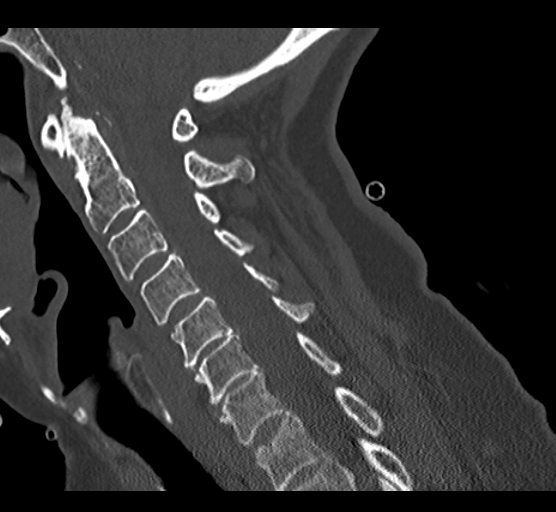
[im 48/83  bone]
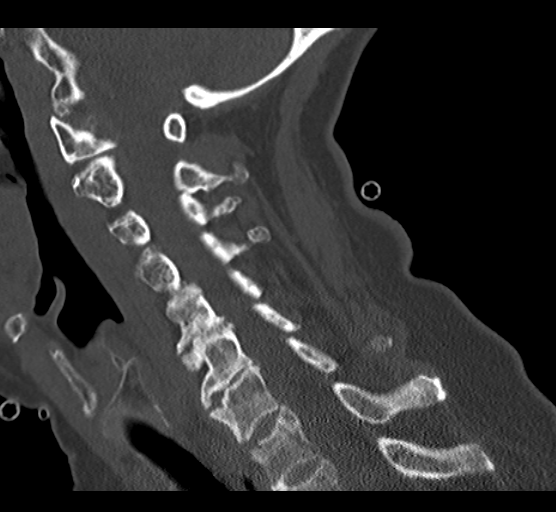
[im 55/83  bone]
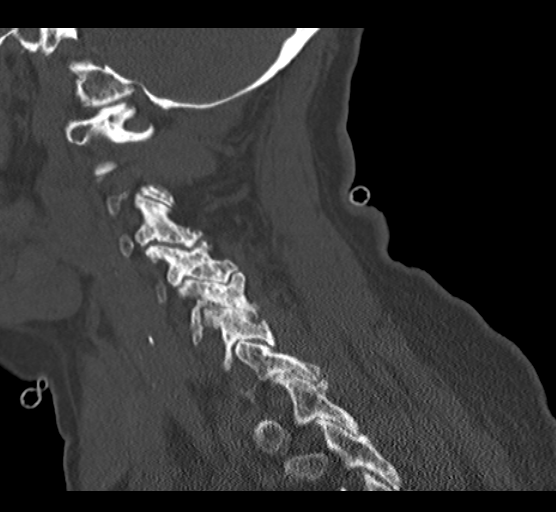

[16 of 33 positions shown; findings below may reference images not displayed]

FINDINGS: CT HEAD FINDINGS

The bony calvarium is intact. No focal soft tissue abnormality is
noted. Diffuse atrophic and chronic white matter ischemic change is
seen. Basal ganglia calcifications are noted bilaterally. No
findings to suggest acute hemorrhage, acute infarction or
space-occupying mass lesion are noted.

CT CERVICAL SPINE FINDINGS

Seven cervical segments are well visualized. Vertebral body height
is well maintained. Multilevel facet hypertrophic changes are noted
slightly worse on the left than the right. Osteophytic changes are
noted at C5-6 and C6-7. No acute fracture or acute facet abnormality
is noted.
IMPRESSION: CT of the head: Chronic changes without acute abnormality.

CT of the cervical spine: Chronic changes without acute abnormality.

## 2015-11-02 IMAGING — CR DG LUMBAR SPINE COMPLETE 4+V
5 series · 5 of 5 positions shown · non-contrast
Comparison: 11/18/2013

CLINICAL DATA: Recent traumatic injury with back pain

EXAM:
LUMBAR SPINE - COMPLETE 4+ VIEW

[t l-spine a.p.]
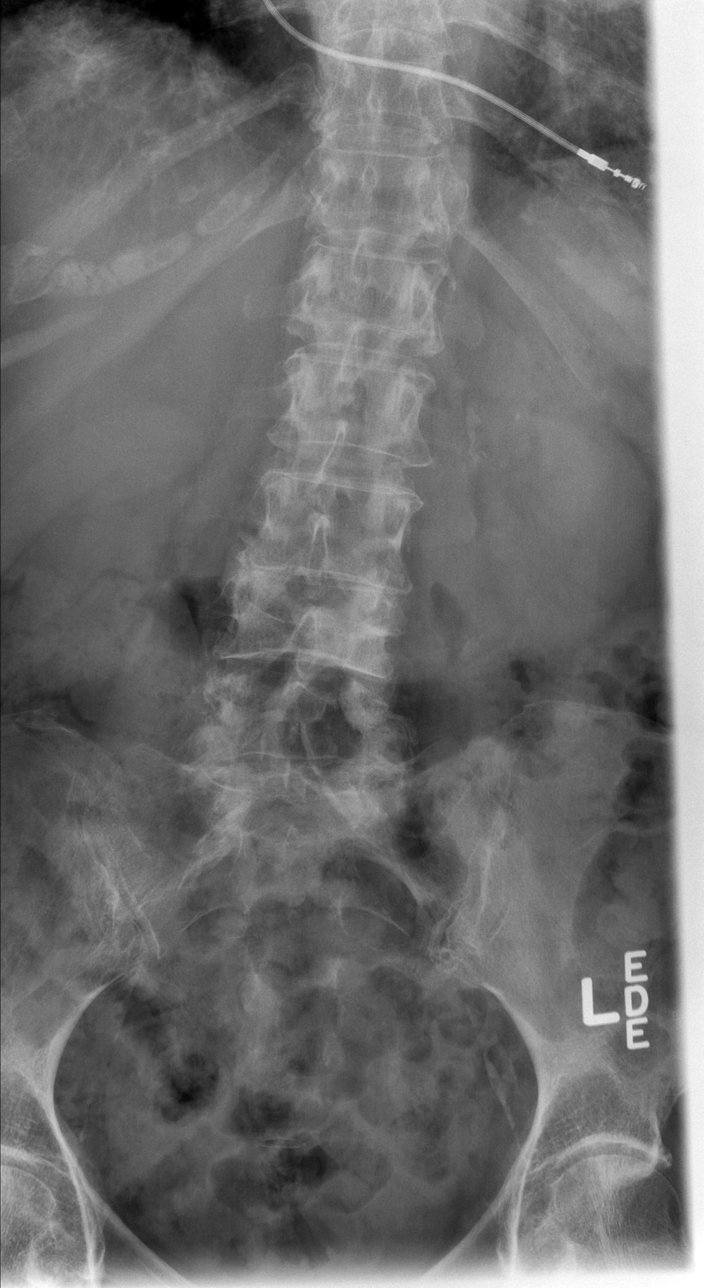

[t l-spine oblique exposure (1 of 2)]
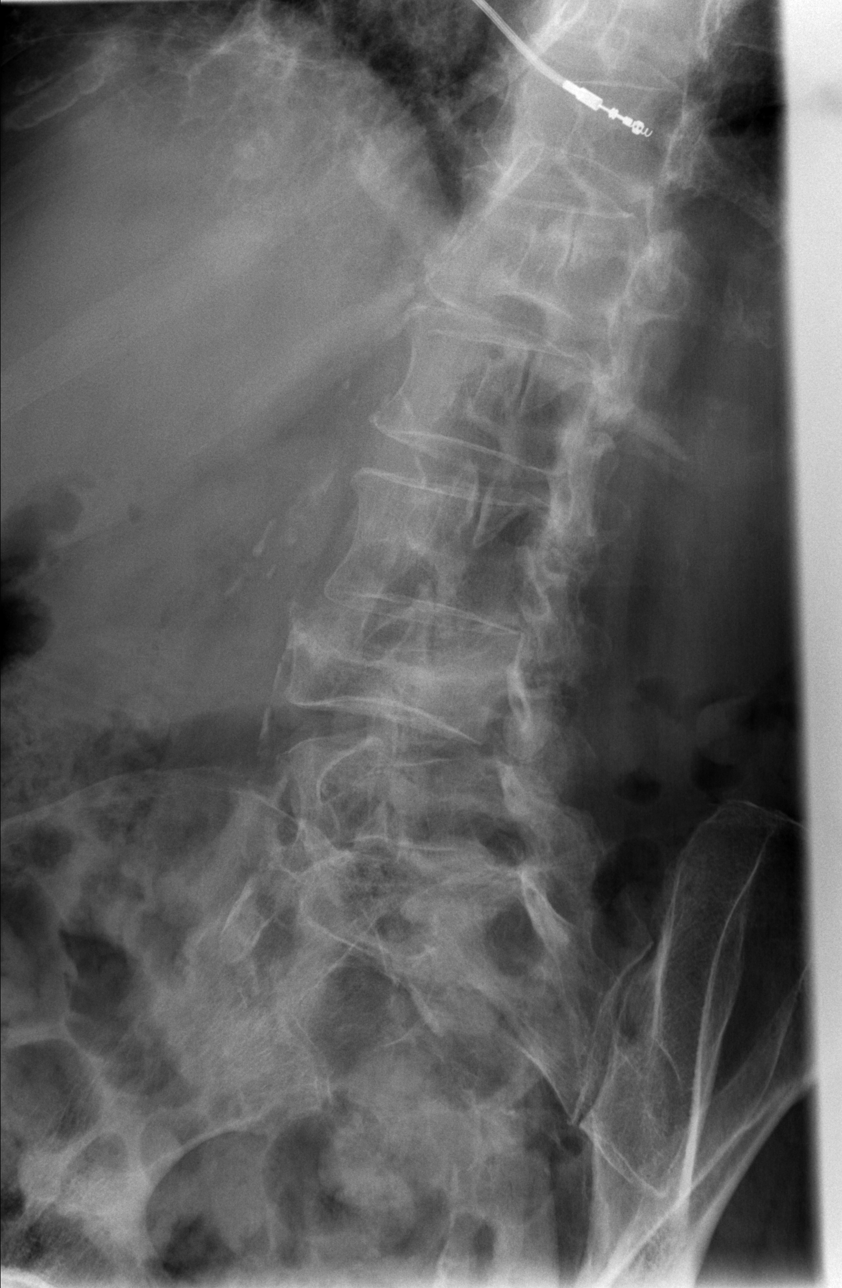

[t l-spine oblique exposure (2 of 2)]
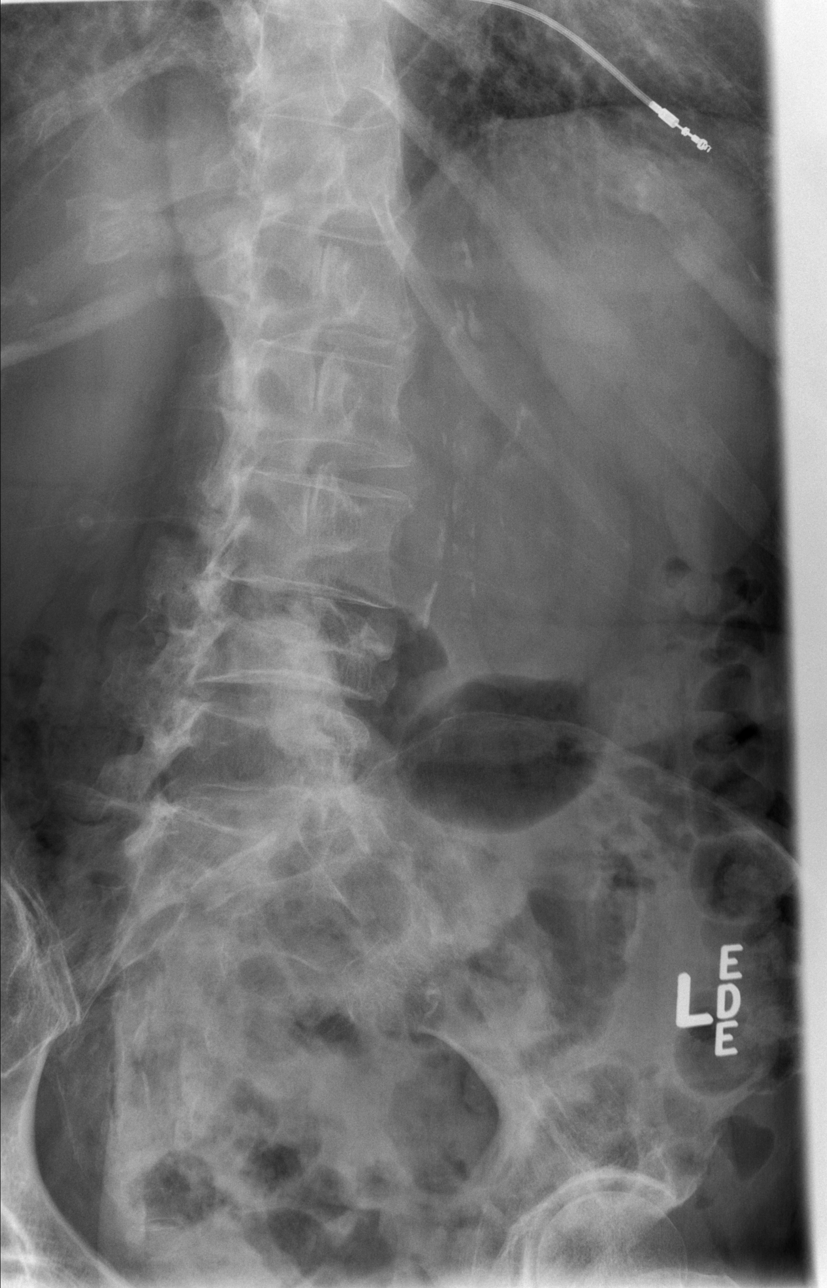

[t l-spine lat]
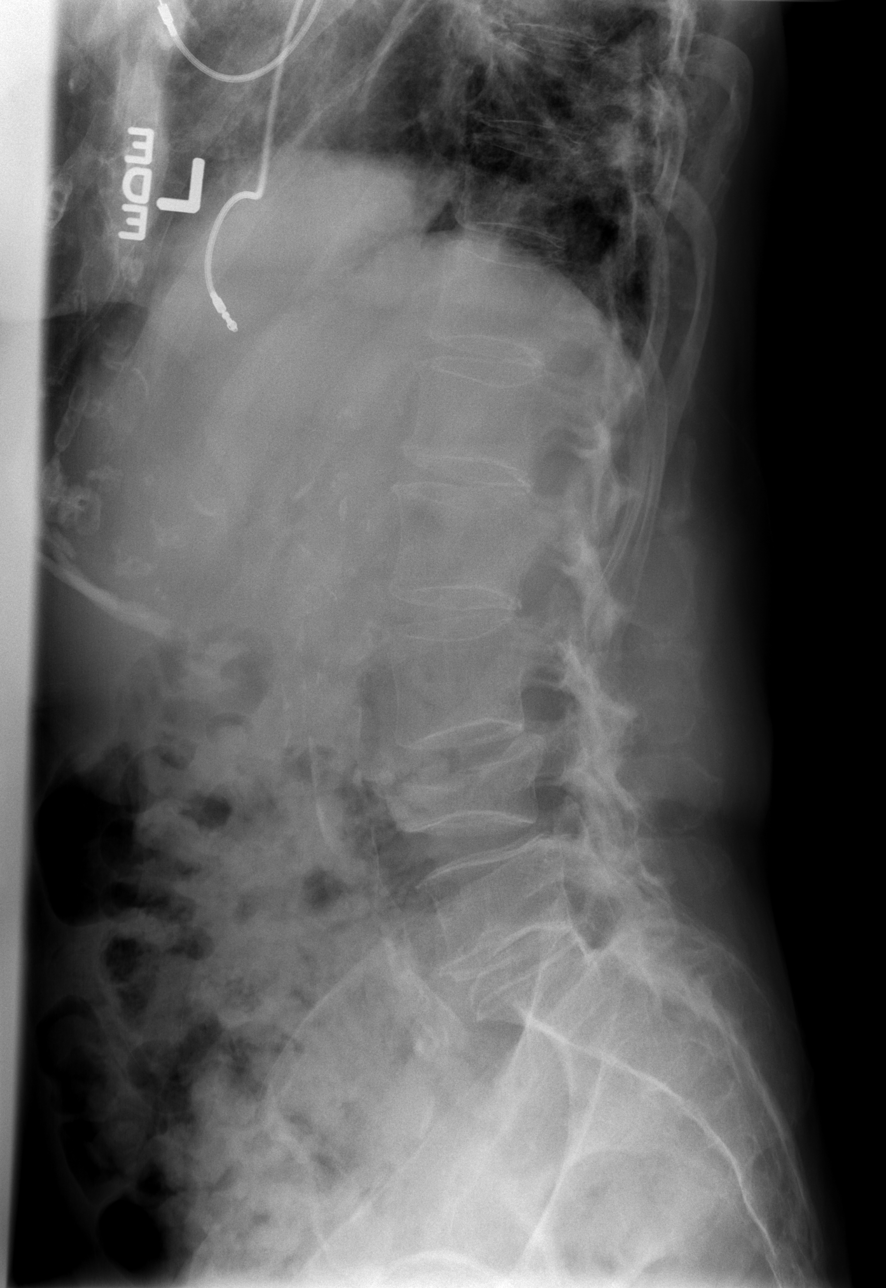

[t l-spine l5-s1 spot]
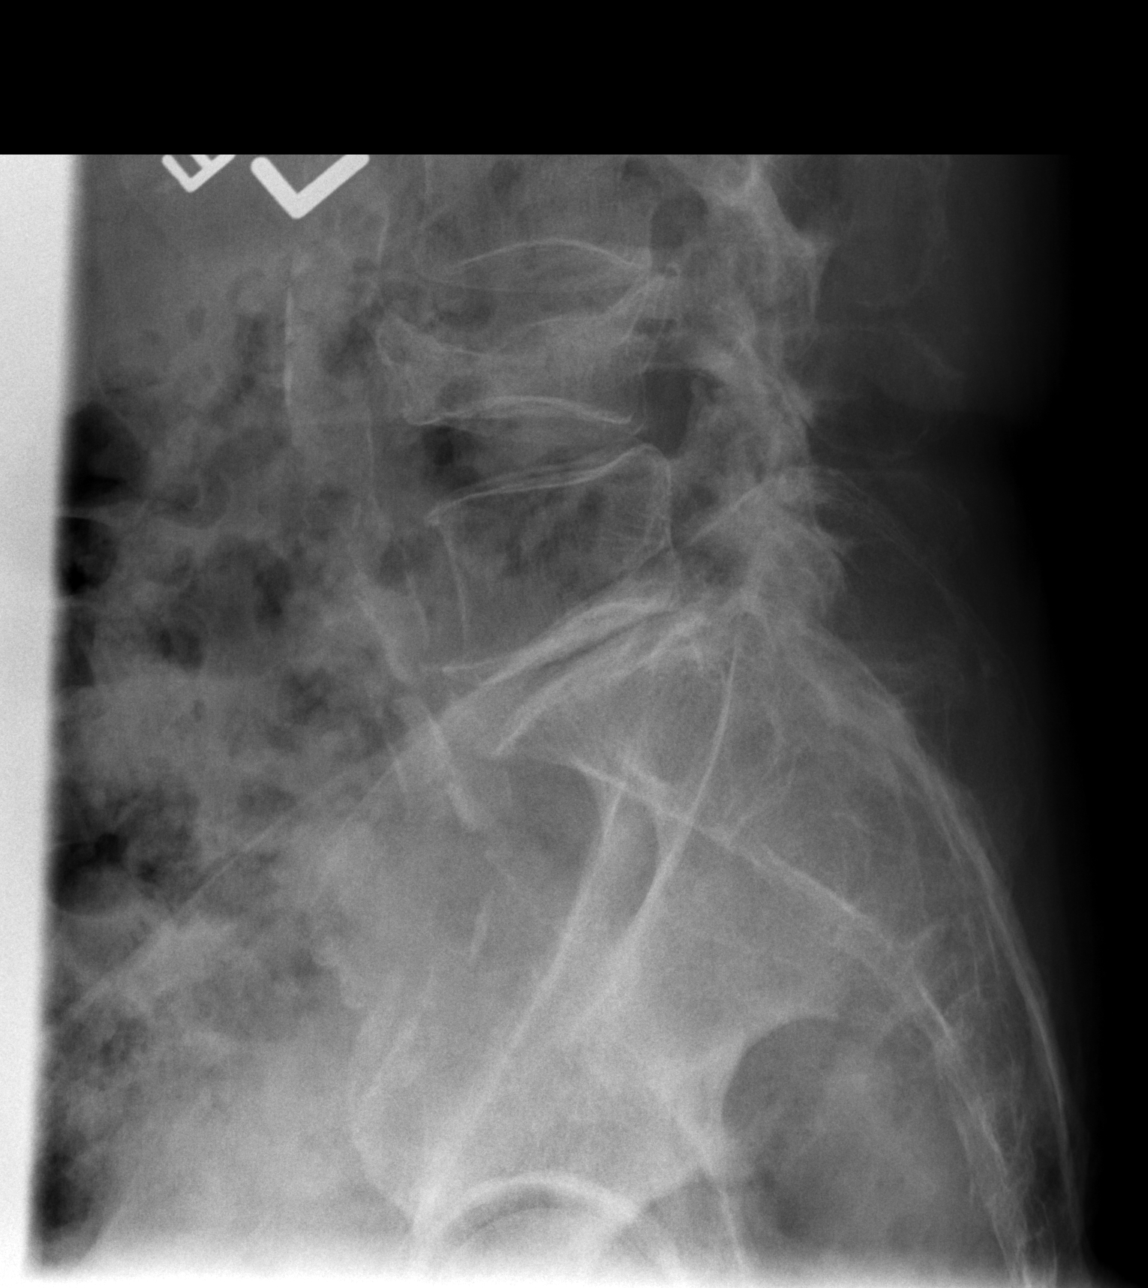

[5 of 5 positions shown; findings below may reference images not displayed]

FINDINGS: Five lumbar type vertebral bodies are well visualized. No pars
defects are seen. Compression deformity of L4 is again seen and
stable from the prior exam. No new compression deformity is seen. No
other fractures are noted. Diffuse aortic calcifications are noted.
IMPRESSION: Stable L4 compression deformity.

No acute abnormality noted.

## 2015-11-02 IMAGING — CR DG SACRUM/COCCYX 2+V
3 series · 3 of 3 positions shown · non-contrast
Comparison: None.

CLINICAL DATA: Back pain after fall.

EXAM:
SACRUM AND COCCYX - 2+ VIEW

[t sacrum a.p.]
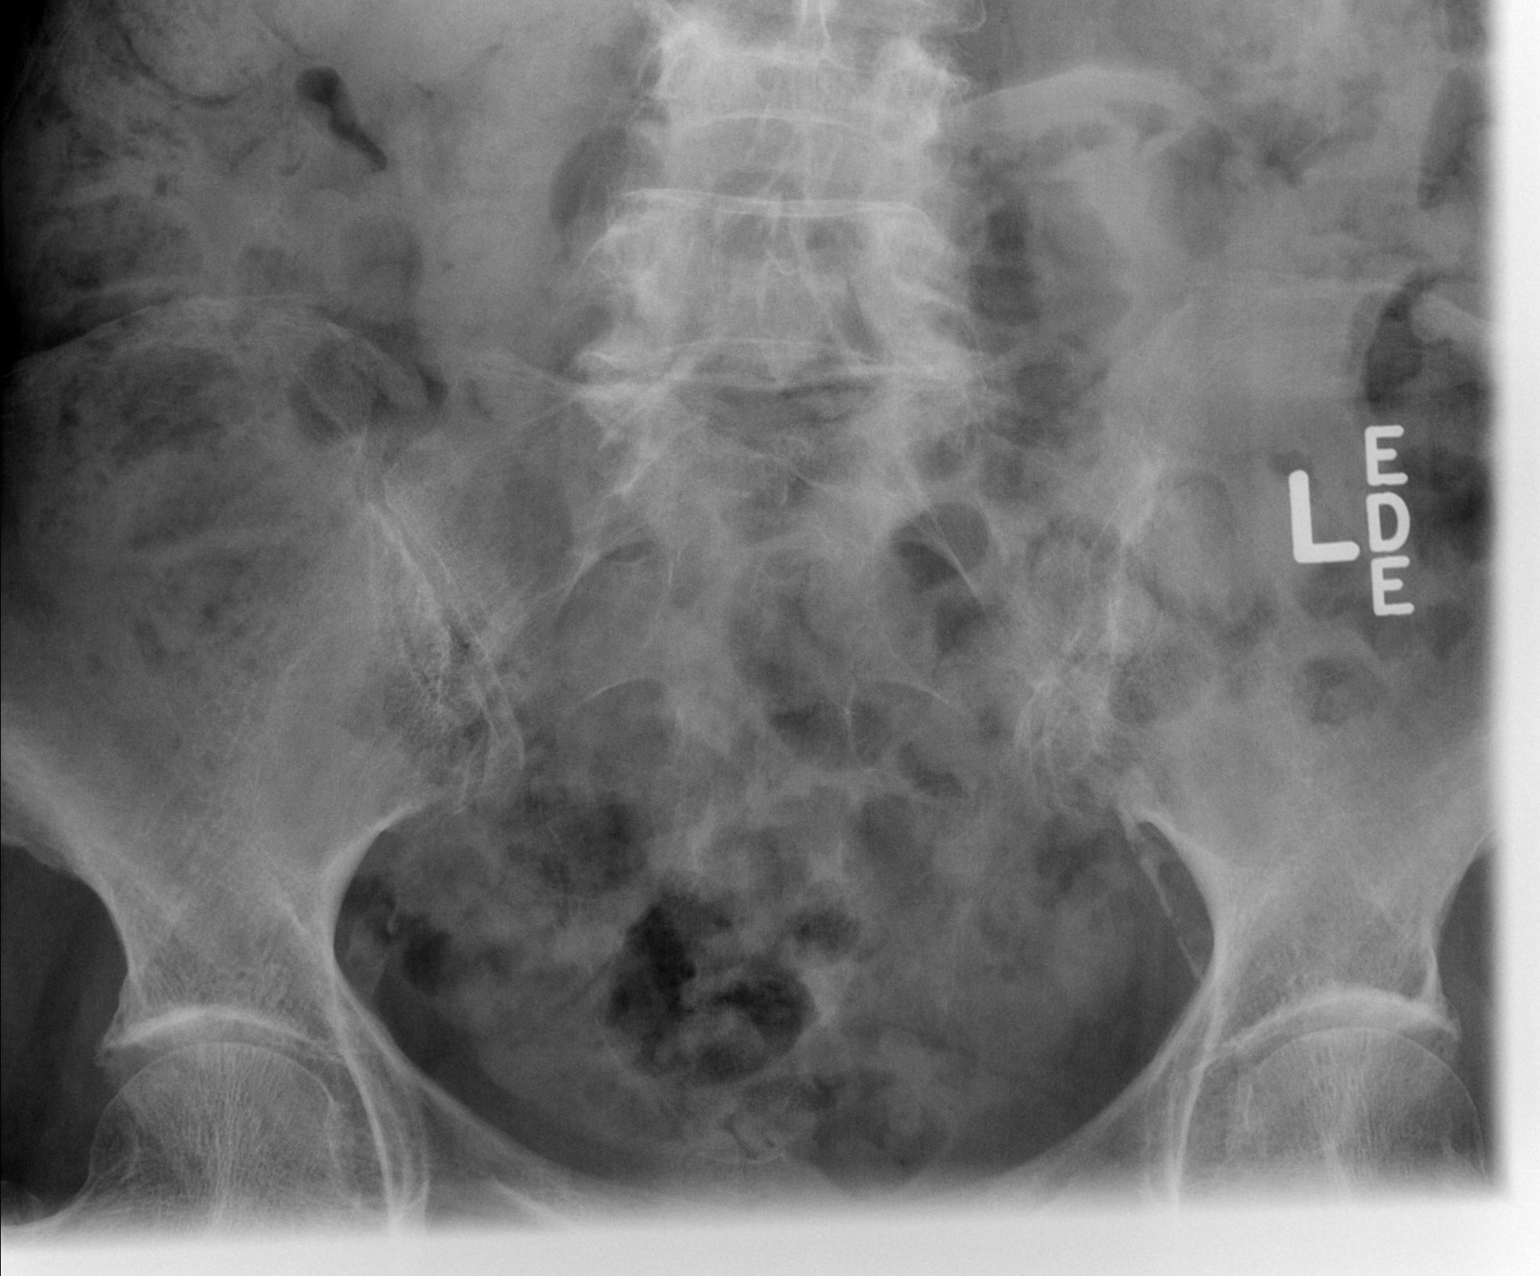

[t coccyx a.p.]
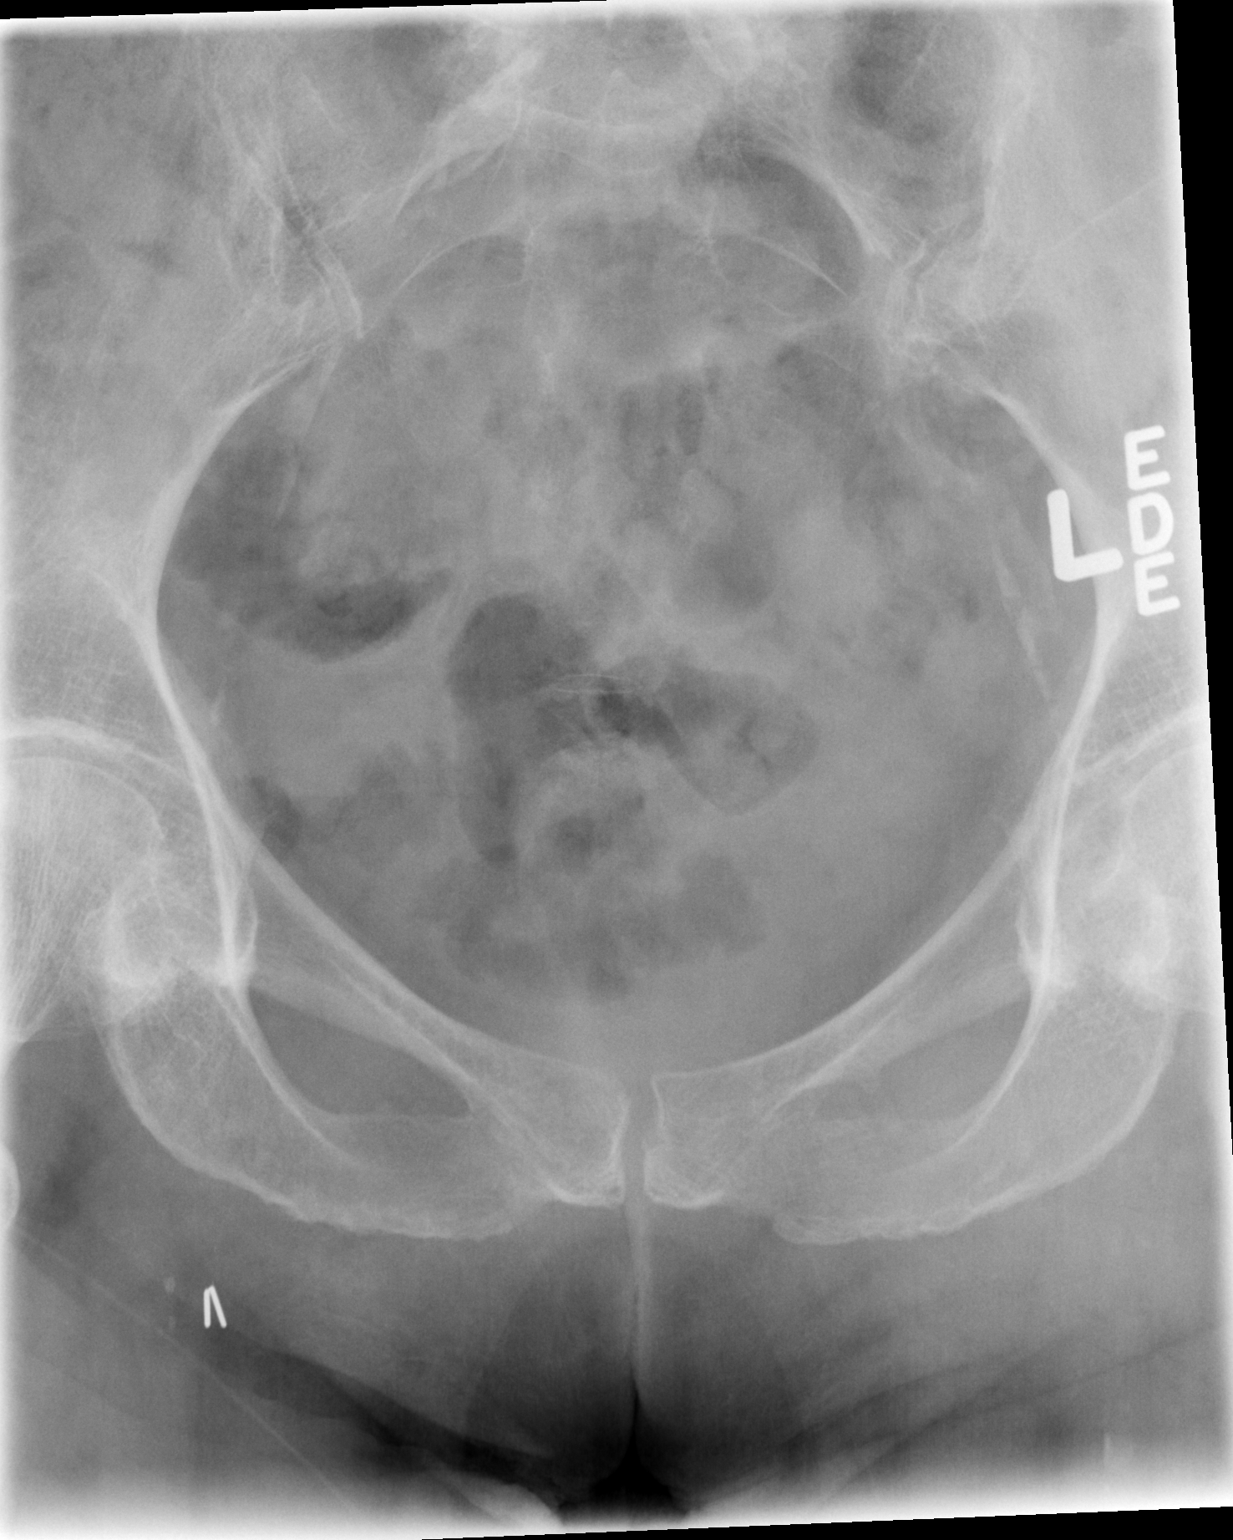

[t sacrum lat]
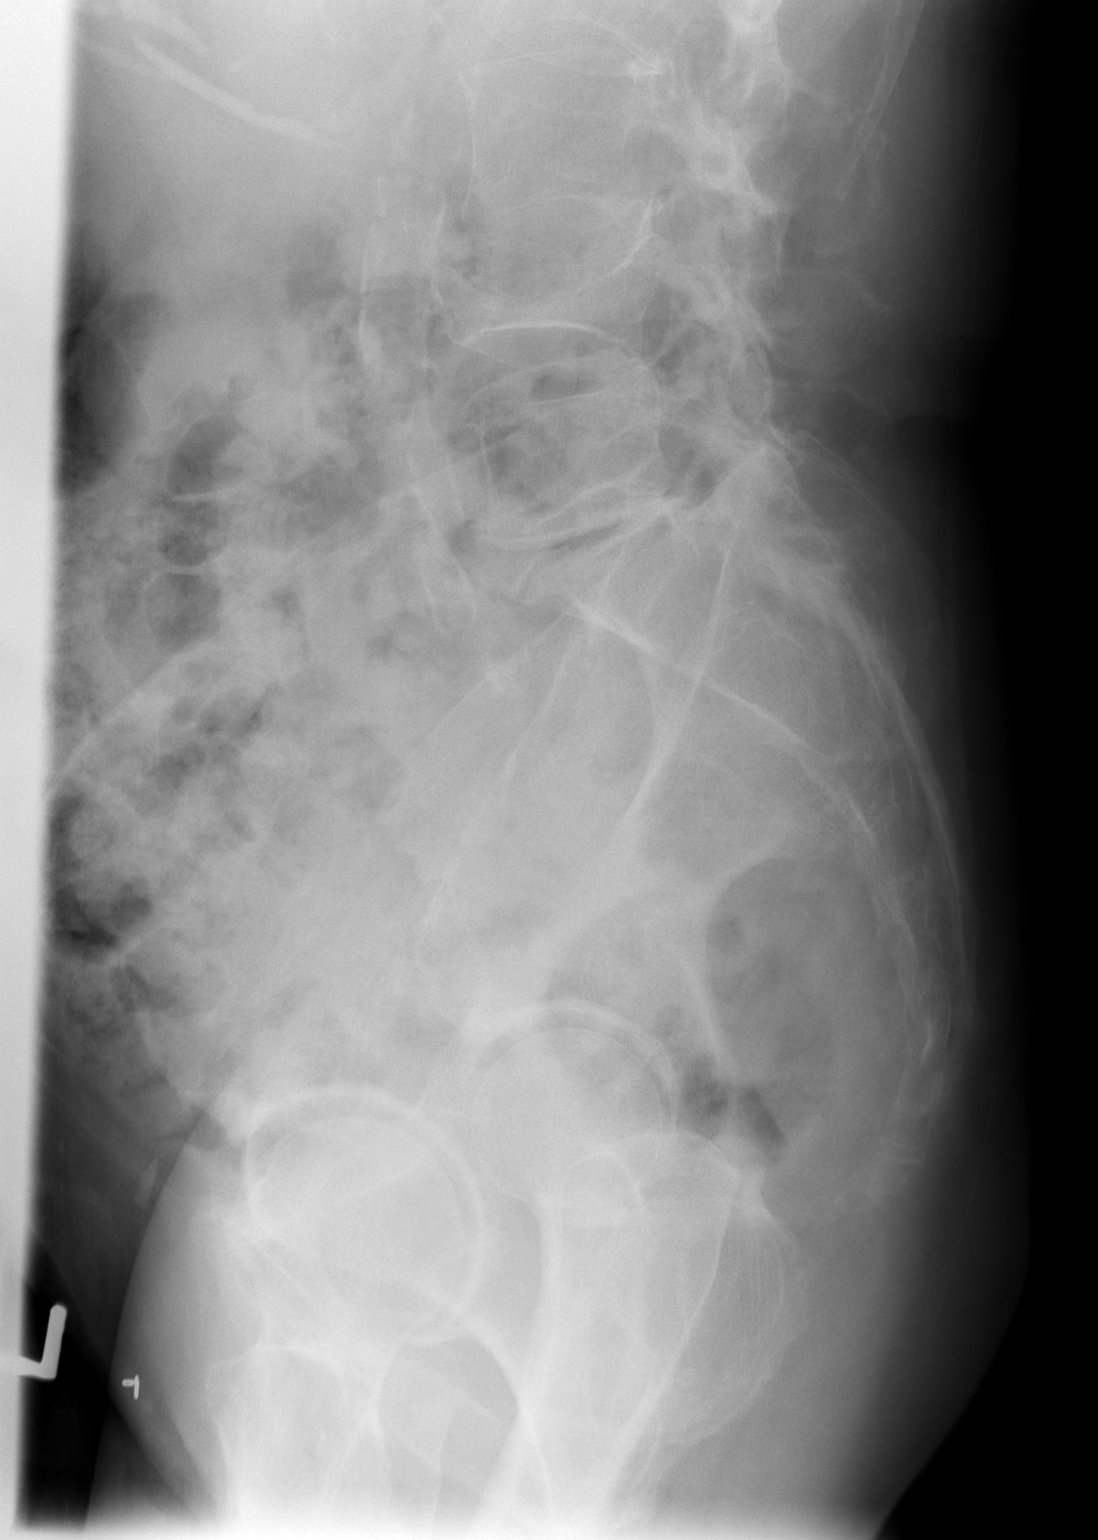

[3 of 3 positions shown; findings below may reference images not displayed]

FINDINGS: No fracture or other abnormality is seen involving the sacrum or
coccyx. Sacroiliac joints appear normal.
IMPRESSION: Normal sacrum and coccyx.

## 2015-11-02 MED ORDER — IOHEXOL 240 MG/ML SOLN
50.0000 mL | INTRAMUSCULAR | Status: AC
  Administered 2015-11-02: 50 mL via ORAL
  Administered 2015-11-02: 25 mL via ORAL

## 2015-11-02 MED ORDER — IOHEXOL 300 MG/ML  SOLN
80.0000 mL | Freq: Once | INTRAMUSCULAR | Status: AC | PRN
  Administered 2015-11-02: 80 mL via INTRAVENOUS

## 2015-11-02 NOTE — ED Provider Notes (Signed)
[facility] Emergency Department Provider Note  ____________________________________________  Time seen: 8:00 PM  I have reviewed the triage vital signs and the nursing notes.   HISTORY  Chief Complaint Medication Reaction  History Limited by dementia  HPI [name] [name] is a 79 y.o. female who sent to the ED today for her somnolence and not being very arousable. Family at the bedside reports that they think the patient was given Ativan and then Vicodin today. The patient takes Vicodin very rarely, maybe once a month, because it makes her so sedated. I additionally discussed the patient's care with her daughter who advises that the patient was Bos to be taken off the Ativan along time ago. Review of the medication reconciliation shows that Ativan is handwritten into the medication list were all the others are printed. She received Ativan and Vicodin around the same time yesterday evening.  The family saw the patient yesterday evening and report that she had been in her usual state of health and had no concerns. The patient herself denies any complaints.     Past Medical History  Diagnosis Date  . Hypertrophic cardiomyopathy (HCC)     a. 11/2013 Echo: EF 55-60%, basal inf HK, sev dil LA, no evidence of HCM.  PASP .  [name] Hypertension   . Situational mixed anxiety and depressive disorder   . Hypercholesterolemia   . Falls frequently     coumadin stopped  . Pulmonary HTN (HCC)     a. has had prior right heart cath in 2010; was felt that most likely due to elevated left sided pressures and would not benefit from vasodilator therapy;  b. 11/2013 Echo: PASP .  [name] Oxygen dependent     "3L 24/7" (08/05/2013)  . COPD (chronic obstructive pulmonary disease) (HCC)   . TIA (transient ischemic attack)   . Atrial fibrillation (HCC)     a. Chronic; No longer on coumadin 2/2 frequent falls.  . CHF (congestive heart failure) (HCC)   . Pacemaker     a. 04/2012 MDT  [id number] [employer] PPM, ser #: [id number] H.  . Varicose veins   . History of pneumonia     "couple times; long time ago" (08/05/2013)  . Type II diabetes mellitus (HCC)   . Anemia     "as a child" (08/05/2013)  . GERD (gastroesophageal reflux disease)   . H/O hiatal hernia   . Osteoarthritis   . Arthritis     "all over me; in all my joints" (08/05/2013)  . Coccyx pain   . Gout     "right foot" (08/05/2013)  . Anxiety   . Depression   . Chest pain     a. 08/2011 Myoview: sm, fixed anteroseptal defect (attenuation), no ischemia, EF 62%.  . Dementia   . Hyposmolality   . Hyponatremia      Patient Active Problem List   Diagnosis Date Noted  . Closed head injury 05/30/2014  . Pulmonary hypertension, moderate to severe (HCC) 05/30/2014  . DM (diabetes mellitus), type 2 with neurological complications (HCC) 05/30/2014  . Hypoglycemia 05/29/2014  . Hypokalemia 05/29/2014  . Subdural hematoma caused by concussion (HCC) 05/28/2014  . hemorrhagic cerebral contusion 05/28/2014  . Dyslipidemia 03/07/2014  . Chronic respiratory failure (HCC) 01/01/2014  . Chest pain   . UTI (urinary tract infection) 11/19/2013  . Compression fracture of L4 lumbar vertebra (HCC) 11/18/2013  . Syncope 11/18/2013  . Protein-calorie malnutrition, severe (HCC) 11/18/2013  . Syncope and collapse 11/17/2013  .  Closed compression fracture of L4 lumbar vertebra (HCC) 11/17/2013  . Gait difficulty 10/05/2013  . Fall (on)(from) incline, initial encounter 10/03/2013  . Essential hypertension, benign 10/03/2013  . Unsteady gait 10/03/2013  . Peripheral neuropathy (HCC) 10/03/2013  . Back pain 08/06/2013  . Near syncope 08/06/2013  . Pulmonary hypertension (HCC) 08/05/2013  . Abnormality of gait 05/24/2013  . Acute respiratory distress (HCC) 05/08/2013  . Pulmonary edema, acute (HCC) 05/08/2013  . Benign hypertensive heart disease without heart failure   . Pulmonary HTN (HCC)   . Oxygen dependent   . COPD  (chronic obstructive pulmonary disease) (HCC)   . Fatigue due to cardiopulmonary and deconditioning 04/23/2013  . Memory deficit 02/07/2013  . Memory change 12/11/2012  . TIA (transient ischemic attack) 02/19/2012  . Chest pain at rest 08/05/2011  . Type II or unspecified type diabetes mellitus without mention of complication, uncontrolled 06/12/2011  . Fall 03/22/2011  . Mitral regurgitation 03/13/2011  . Hypertrophic cardiomyopathy (HCC)   . Osteoarthritis   . Situational mixed anxiety and depressive disorder   . Pacemaker 02/26/2011  . Atrial fibrillation (HCC) 01/28/2011  . CHRONIC OBSTRUCTIVE PULMONARY DISEASE, MODERATE 05/23/2010  . DEPRESSIVE DISORDER NOT ELSEWHERE CLASSIFIED 04/11/2010  . HYPERLIPIDEMIA 05/31/2009  . HYPERTENSION 05/31/2009  . ALLERGIC RHINITIS 05/31/2009  . SHORTNESS OF BREATH 05/31/2009  . Cough 05/31/2009     Past Surgical History  Procedure Laterality Date  . Total knee arthroplasty Right 2011  . Tonsillectomy    . Appendectomy    . Abdominal hysterectomy      "partial" (08/05/2013)  . Dilation and curettage of uterus      "several" (08/05/2013)  . Foot neuroma surgery Right   . Insert / replace / remove pacemaker  1998; 2000's; 2014  . Cardiac catheterization      "more than once" (08/05/2013)  . Back surgery      "bone spur removed; mid back" (08/05/2013)  . Pacemaker generator change N/A 04/24/2012    Procedure: PACEMAKER GENERATOR CHANGE;  Surgeon: Duke Salvia, MD;  Location: [facility] CATH LAB;  Service: Cardiovascular;  Laterality: N/A;     Current Outpatient Rx  Name  Route  Sig  Dispense  Refill  . acetaminophen (TYLENOL) 325 MG tablet   Oral   Take 650 mg by mouth every 6 (six) hours as needed for mild pain or fever.          . benzocaine-menthol (CHLORASEPTIC) 6-10 MG lozenge   Oral   Take 1 lozenge by mouth as needed for sore throat.         [name] buPROPion (WELLBUTRIN XL) 150 MG 24 hr tablet   Oral   Take 450 mg by mouth daily.           . calcium carbonate (TUMS EX) 750 MG chewable tablet   Oral   Chew 1 tablet by mouth every 6 (six) hours as needed for heartburn.         . Cranberry-Vitamin C-Probiotic (AZO CRANBERRY) 250-30 MG TABS   Oral   Take 1 tablet by mouth daily.         [name] diltiazem (CARDIZEM CD) 240 MG 24 hr capsule   Oral   Take 1 capsule (240 mg total) by mouth daily.   90 capsule   3   . docusate sodium (COLACE) 100 MG capsule   Oral   Take 100 mg by mouth 2 (two) times daily.         . Fluticasone  Furoate-Vilanterol 100-25 MCG/INH AEPB   Inhalation   Inhale 1 puff into the lungs every morning.         [name] guaifenesin (ROBITUSSIN) 100 MG/5ML syrup   Oral   Take 200 mg by mouth 4 (four) times daily as needed for cough.         [name] HYDROcodone-acetaminophen (NORCO/VICODIN) 5-325 MG per tablet   Oral   Take 2 tablets by mouth every 4 (four) hours as needed. Patient taking differently: Take 1 tablet by mouth every 4 (four) hours as needed.    16 tablet   0   . losartan (COZAAR) 50 MG tablet      TAKE 1 TABLET BY MOUTH EVERY DAY   30 tablet   1   . metFORMIN (GLUCOPHAGE) 500 MG tablet   Oral   Take 500 mg by mouth daily with breakfast.          . Multiple Vitamins-Iron (MULTIVITAMIN/IRON PO)   Oral   Take 1 tablet by mouth daily.         . nitrofurantoin (MACRODANTIN) 100 MG capsule   Oral   Take 100 mg by mouth daily.         [name] oxybutynin (DITROPAN XL) 15 MG 24 hr tablet   Oral   Take 15 mg by mouth every morning.          . potassium chloride SA (K-DUR,KLOR-CON) 20 MEQ tablet   Oral   Take 20 mEq by mouth daily. In the morning         . prochlorperazine (COMPAZINE) 10 MG tablet   Oral   Take 10 mg by mouth every 6 (six) hours as needed for nausea or vomiting.         . senna (SENOKOT) 8.6 MG TABS tablet   Oral   Take 1 tablet by mouth 2 (two) times daily.         . traZODone (DESYREL) 50 MG tablet   Oral   Take 25 mg by mouth 3 (three)  times daily.             Allergies Aspirin; Calan; Crestor; Escitalopram oxalate; Lipitor; Lopressor; Nitroglycerin; Norvasc; Nsaids; Oxycodone; Prednisone; Sulfa antibiotics; Vimovo; and Penicillins   Family History  Problem Relation Age of Onset  . Other Father     died in plane crash  . Cancer Mother     lymphoma-NHL  . Coronary artery disease Grandchild   . Heart disease Maternal Grandmother     Social History Social History  Substance Use Topics  . Smoking status: Former Smoker -- 1.00 packs/day for 40 years    Types: Cigarettes    Quit date: 11/15/2005  . Smokeless tobacco: Never Used  . Alcohol Use: No    Review of Systems  Constitutional:   No fever or chills. No weight changes Eyes:   No blurry vision or double vision.  ENT:   No sore throat. Cardiovascular:   No chest pain. Respiratory:   No dyspnea or cough. Gastrointestinal:   Negative for abdominal pain, vomiting and diarrhea.  No BRBPR or melena. Genitourinary:   Negative for dysuria, urinary retention, bloody urine, or difficulty urinating. Musculoskeletal:   Negative for back pain. No joint swelling or pain. Skin:   Negative for rash. Neurological:   Negative for headaches, focal weakness or numbness. Psychiatric:  No anxiety or depression.   Endocrine:  No hot/cold intolerance, changes in energy, or sleep difficulty.  10-point ROS otherwise negative.  ____________________________________________  PHYSICAL EXAM:  VITAL SIGNS: ED Triage Vitals  Enc Vitals Group     BP 11/02/15 1946 131/85 mmHg     Pulse Rate 11/02/15 1946 75     Resp 11/02/15 1946 15     Temp 11/02/15 1946 97.3 F (36.3 C)     Temp Source 11/02/15 1946 Oral     SpO2 11/02/15 1946 97 %     Weight 11/02/15 1946 103 lb (46.72 kg)     Height 11/02/15 1946 5\' 4"  (1.626 m)     Head Cir --      Peak Flow --      Pain Score 11/02/15 1948 0     Pain Loc --      Pain Edu? --      Excl. in GC? --     Vital signs  reviewed, nursing assessments reviewed.   Constitutional:   Alert and oriented to person. Well appearing and in no distress. Eyes:   No scleral icterus. No conjunctival pallor. PERRL. EOMI ENT   Head:   Normocephalic and atraumatic.   Nose:   No congestion/rhinnorhea. No septal hematoma   Mouth/Throat:   MMM, no pharyngeal erythema. No peritonsillar mass. No uvula shift.   Neck:   No stridor. No SubQ emphysema. No meningismus. Hematological/Lymphatic/Immunilogical:   No cervical lymphadenopathy. Cardiovascular:   Irregularly irregular rhythm. Normal and symmetric distal pulses are present in all extremities. No murmurs, rubs, or gallops. Respiratory:   Normal respiratory effort without tachypnea nor retractions. Breath sounds are clear and equal bilaterally. No wheezes/rales/rhonchi. Gastrointestinal:   Soft with right upper quadrant and epigastric tenderness. No distention. There is no CVA tenderness.  No rebound, rigidity, or guarding. Genitourinary:   deferred Musculoskeletal:   Nontender with normal range of motion in all extremities. No joint effusions.  No lower extremity tenderness.  No edema. Neurologic:   Normal speech and language.  CN 2-10 normal. Motor grossly intact. No pronator drift. Follows commands, interactive No gross focal neurologic deficits are appreciated.  Skin:    Skin is warm, dry and intact. No rash noted.  No petechiae, purpura, or bullae. Psychiatric:   Mood and affect are normal. Speech and behavior are normal. Patient exhibits appropriate insight and judgment.  ____________________________________________    LABS (pertinent positives/negatives) (all labs ordered are listed, but only abnormal results are displayed) Labs Reviewed  COMPREHENSIVE METABOLIC PANEL - Abnormal; Notable for the following:    Glucose, Bld 139 (*)    BUN 23 (*)    Creatinine, Ser 1.04 (*)    GFR calc non Af Amer 50 (*)    GFR calc Af Amer 59 (*)    All other  components within normal limits  URINALYSIS COMPLETEWITH MICROSCOPIC ([facility] ONLY) - Abnormal; Notable for the following:    Color, Urine YELLOW (*)    APPearance CLEAR (*)    Ketones, ur TRACE (*)    All other components within normal limits  URINE CULTURE  LIPASE, BLOOD  CBC WITH DIFFERENTIAL/PLATELET  TROPONIN I  LACTIC ACID, PLASMA  LACTIC ACID, PLASMA   ____________________________________________   EKG  Interpreted by me Course atrial fibrillation with a rate of 92, normal axis and intervals. Normal QRS and ST segments and T waves. There are for PVCs on the strip.  ____________________________________________    RADIOLOGY  CT abdomen and pelvis pending  ____________________________________________   PROCEDURES   ____________________________________________   INITIAL IMPRESSION / ASSESSMENT AND PLAN / ED COURSE  Pertinent labs & imaging  results that were available during my care of the patient were reviewed by me and considered in my medical decision making (see chart for details).  Patient presents with somnolence which appears to be an accidental oversedation with comminution of benzodiazepine and opioid medication. She is getting back to her baseline mental status and behavior at present time as confirmed by the family at the bedside. However there is a surprising finding of upper abdominal tenderness. We'll workup with labs urinalysis and CT. Patient does have chronic issues with urinary tract infections.  ----------------------------------------- 9:52 PM on 11/02/2015 -----------------------------------------  Labs unremarkable. CT pending.  ----------------------------------------- 11:28 PM on 11/02/2015 -----------------------------------------  Case signed out to Dr. Manson PasseyBrown to follow-up on CT. Patient suitable for outpatient follow-up if CT does not reveal any serious pathology..   ____________________________________________   FINAL CLINICAL  IMPRESSION(S) / ED DIAGNOSES  Final diagnoses:  Sedative overdose, initial encounter  Epigastric pain      Sharman CheekPhillip Shem Plemmons, MD 11/02/15 2328

## 2015-11-02 NOTE — ED Notes (Signed)
Per EMS, patient comes from [facility]. Patient c/o back pain at [facility]. Staff gave patient ativan which she normally gets but they also gave oxycodone which she has not had in other a month. Staff at [facility] reported that patient was lethargic and they thought that she stopped breathing. EMS reports patient was at baseline when they arrived. Hx of dementia.

## 2015-11-02 NOTE — ED Provider Notes (Signed)
I assumed care of the patient from Dr Charmian MuffStafford's recommendation to follow up on CT scan and if negative patient was cleared to be discharged home. CT scan of the abdomen and pelvis revealed:    CT Abdomen Pelvis W Contrast (Final result) Result time: 11/02/15 23:25:01   Final result by Rad Results In Interface (11/02/15 23:25:01)   Narrative:   CLINICAL DATA: Lethargic patient.  EXAM: CT ABDOMEN AND PELVIS WITH CONTRAST  TECHNIQUE: Multidetector CT imaging of the abdomen and pelvis was performed using the standard protocol following bolus administration of intravenous contrast.  CONTRAST: 80mL OMNIPAQUE IOHEXOL 300 MG/ML SOLN  COMPARISON: CT of the abdomen pelvis 03/09/2015  FINDINGS: Lower chest: Chronic interstitial lung changes are seen. The heart is enlarged. Pacemaker leads are seen.  Hepatobiliary: No masses or other significant abnormality.  Pancreas: No mass, inflammatory changes, or other significant abnormality.  Spleen: Within normal limits in size and appearance.  Adrenals/Urinary Tract: No masses identified. No evidence of hydronephrosis.  Stomach/Bowel: No evidence of obstruction, inflammatory process, or abnormal fluid collections.  Vascular/Lymphatic: No pathologically enlarged lymph nodes. No evidence of abdominal aortic aneurysm. Heavy atherosclerotic disease of the aorta and its main branches is noted.  Reproductive: No mass or other significant abnormality. Status post hysterectomy.  Other: None.  Musculoskeletal: No suspicious bone lesions identified. There is a chronic compression fracture of L4 vertebral body, not significantly changed from April 2016.  IMPRESSION: No acute findings within the abdomen or pelvis.  Chronic interstitial lung changes.  Chronic compression fracture of L4 vertebral body.   Electronically Signed By: Ted Mcalpineobrinka Dimitrova M.D. On: 11/02/2015 23:25     Darci Currentandolph N Vail Basista, MD 11/03/15 0000

## 2015-11-02 NOTE — ED Notes (Signed)
Patient transported to CT 

## 2015-11-02 NOTE — Discharge Instructions (Signed)
Please make sure the Metformin Home medication gets held for 48 hours if the patient gets admitted. A nursing communication order has been placed.  

## 2015-11-03 LAB — LACTIC ACID, PLASMA: Lactic Acid, Venous: 0.8 mmol/L (ref 0.5–2.0)

## 2015-11-03 MED ORDER — ACETAMINOPHEN 325 MG PO TABS
650.0000 mg | ORAL_TABLET | Freq: Once | ORAL | Status: AC
  Administered 2015-11-03: 650 mg via ORAL
  Filled 2015-11-03: qty 2

## 2015-11-03 NOTE — ED Notes (Signed)
[facility] called and notified of patients discharge.

## 2015-11-04 LAB — URINE CULTURE: CULTURE: NO GROWTH

## 2015-11-18 IMAGING — CT CT HEAD W/O CM
4 of 5 series · 16 of 47 positions shown, 17 images · non-contrast
Comparison: Head and cervical spine CT scan 05/05/2014.

CLINICAL DATA: Status post fall.  Dizziness.

EXAM:
CT HEAD WITHOUT CONTRAST
CT CERVICAL SPINE WITHOUT CONTRAST
TECHNIQUE: Multidetector CT imaging of the head and cervical spine was
performed following the standard protocol without intravenous
contrast. Multiplanar CT image reconstructions of the cervical spine
were also generated.

[Series 2: head 5.0 h30s · axial · 0.43mm/px · z∈[+1365,+1460]mm · 4 of 33 slices shown, 5 images]
[im 7/33  brain]
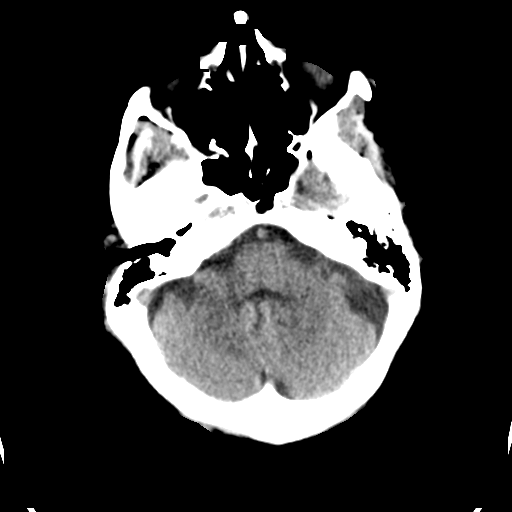
[im 7/33  bone]
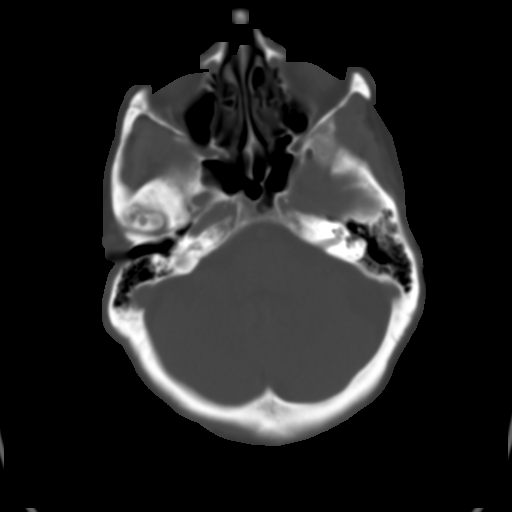
[im 13/33  brain]
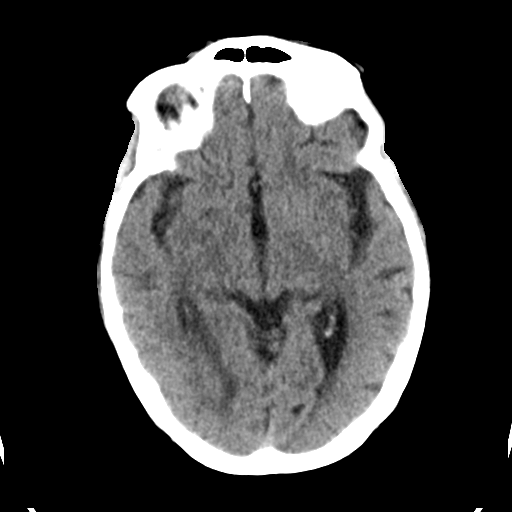
[im 20/33  brain]
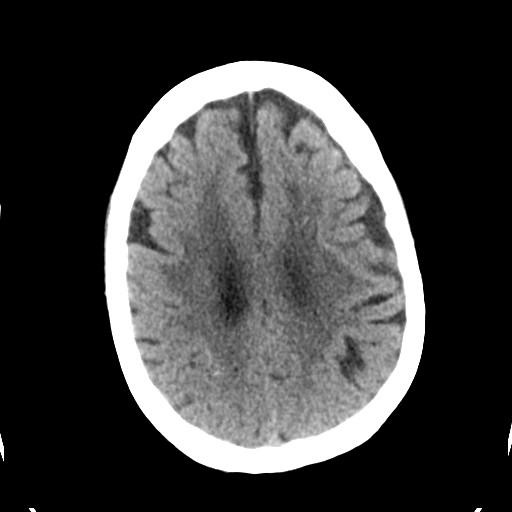
[im 26/33  brain]
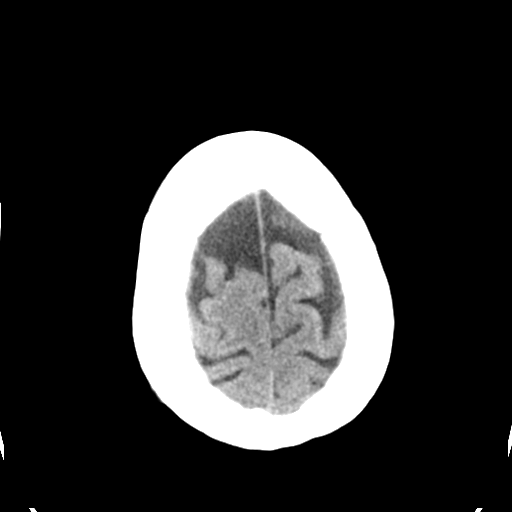

[Series 7: coronals · coronal · 0.30mm/px · 3 of 45 slices shown]
[im 15/45  brain]
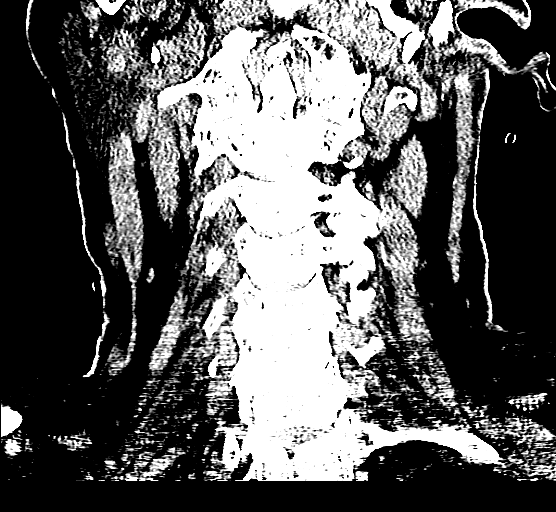
[im 20/45  brain]
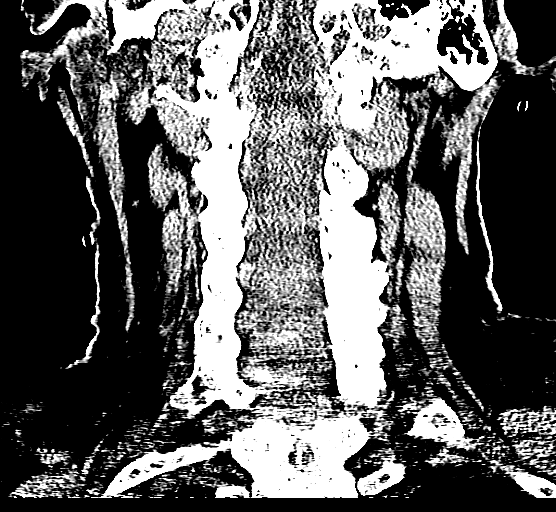
[im 25/45  brain]
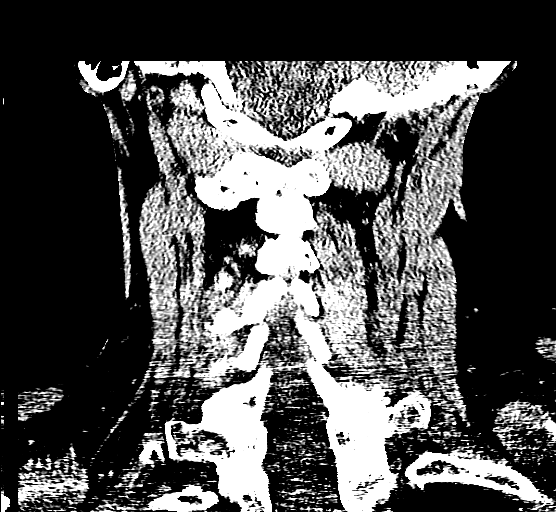

[Series 8: sagittals · sagittal · 0.27mm/px · 3 of 57 slices shown]
[im 19/57  brain]
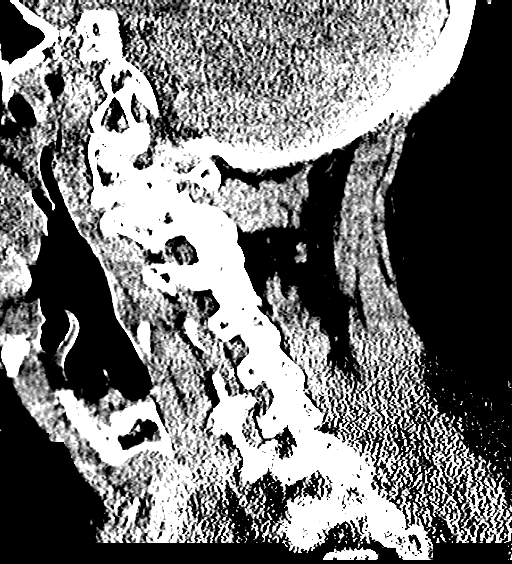
[im 29/57  brain]
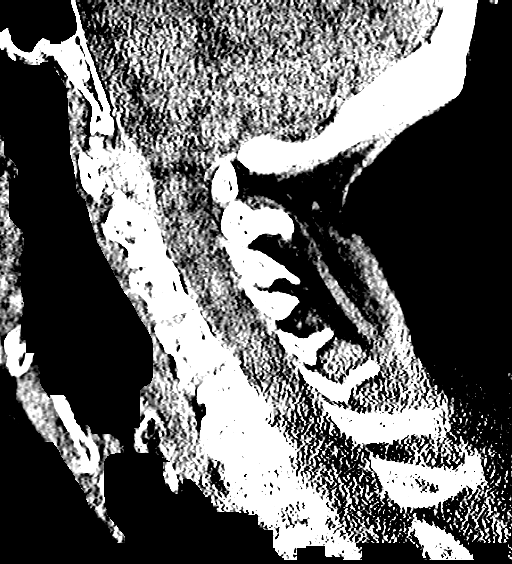
[im 38/57  brain]
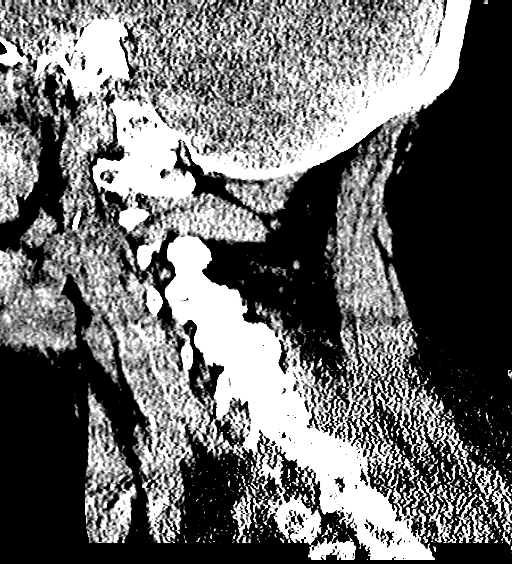

[Series 9: orthogonals · axial · 0.23mm/px · z∈[+1190,+1263]mm · 6 of 75 slices shown]
[im 7/75  brain]
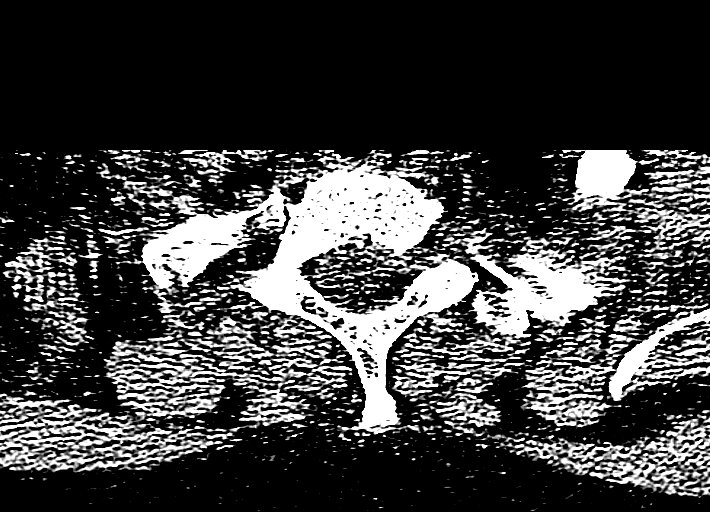
[im 14/75  brain]
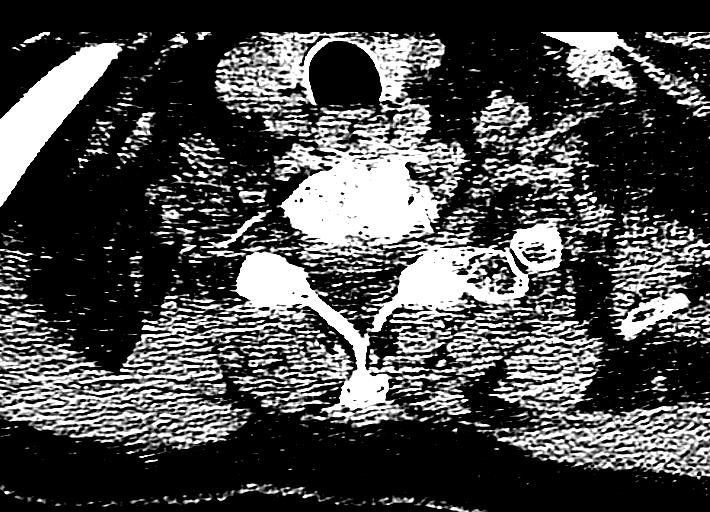
[im 27/75  brain]
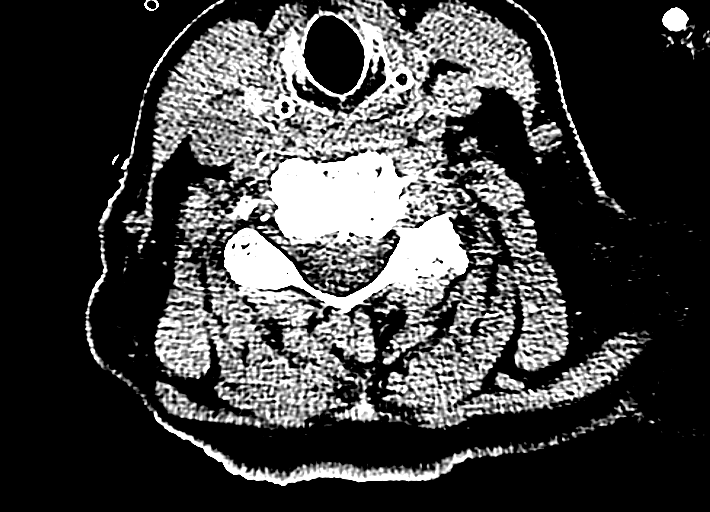
[im 34/75  brain]
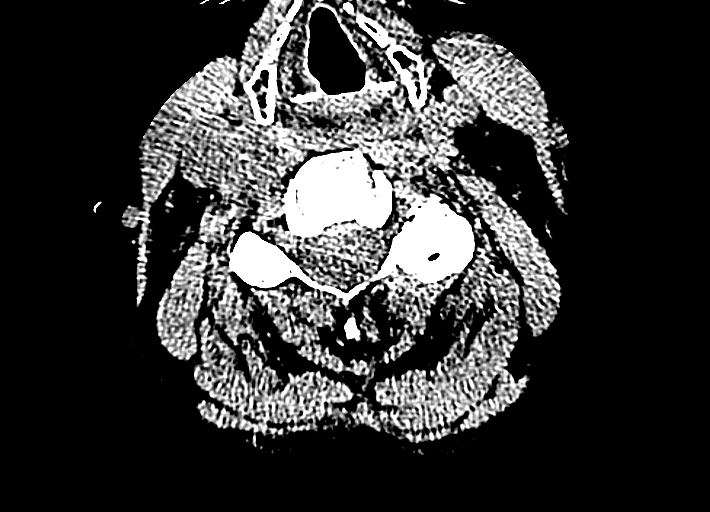
[im 41/75  brain]
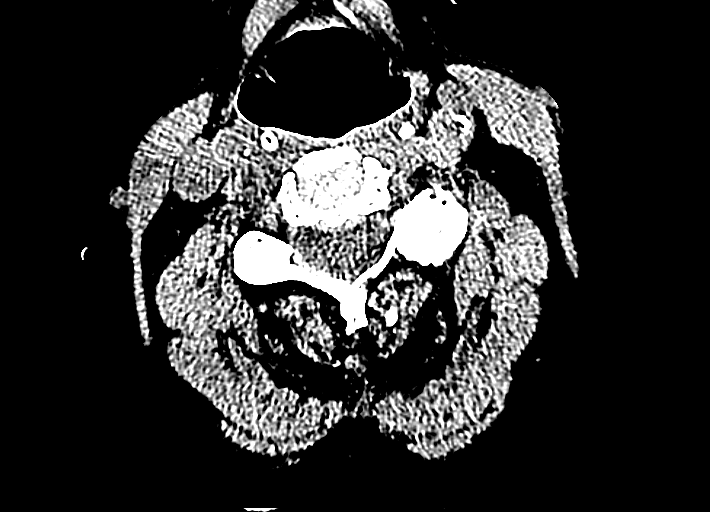
[im 48/75  brain]
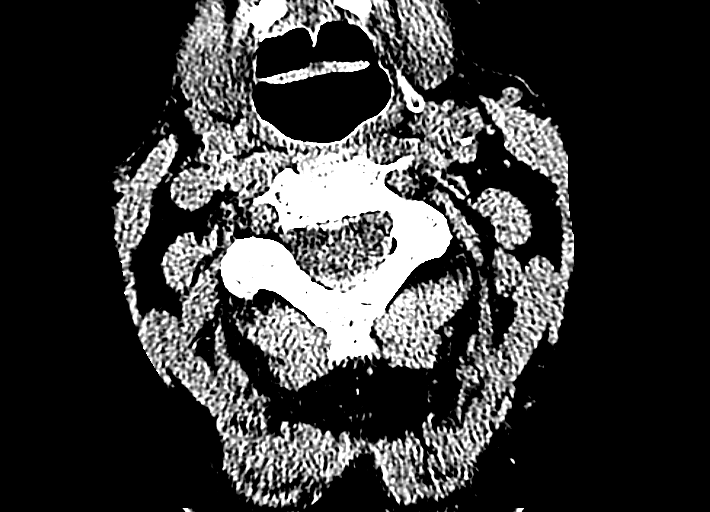

[16 of 47 positions shown; findings below may reference images not displayed]

FINDINGS: CT HEAD FINDINGS

Again seen are atrophy and chronic microvascular ischemic change. No
evidence of acute intracranial abnormality including infarct,
hemorrhage, mass lesion, mass effect, midline shift or abnormal
extra-axial fluid collection is identified. There is no
hydrocephalus or pneumocephalus. The calvarium is intact.

CT CERVICAL SPINE FINDINGS

There is no fracture or malalignment of the cervical spine.
Multilevel facet arthropathy and degenerative disc disease are again
seen. Schmorl's node in the superior endplate of T1 is noted. Imaged
paraspinous structures are unremarkable.
IMPRESSION: No acute finding head or cervical spine. Stable compared to prior
exam.

## 2015-11-18 IMAGING — CR DG CHEST 2V
2 series · 2 of 2 positions shown · non-contrast
Comparison: None.

CLINICAL DATA: CHF.

EXAM:
CHEST  2 VIEW

[w chest lat]
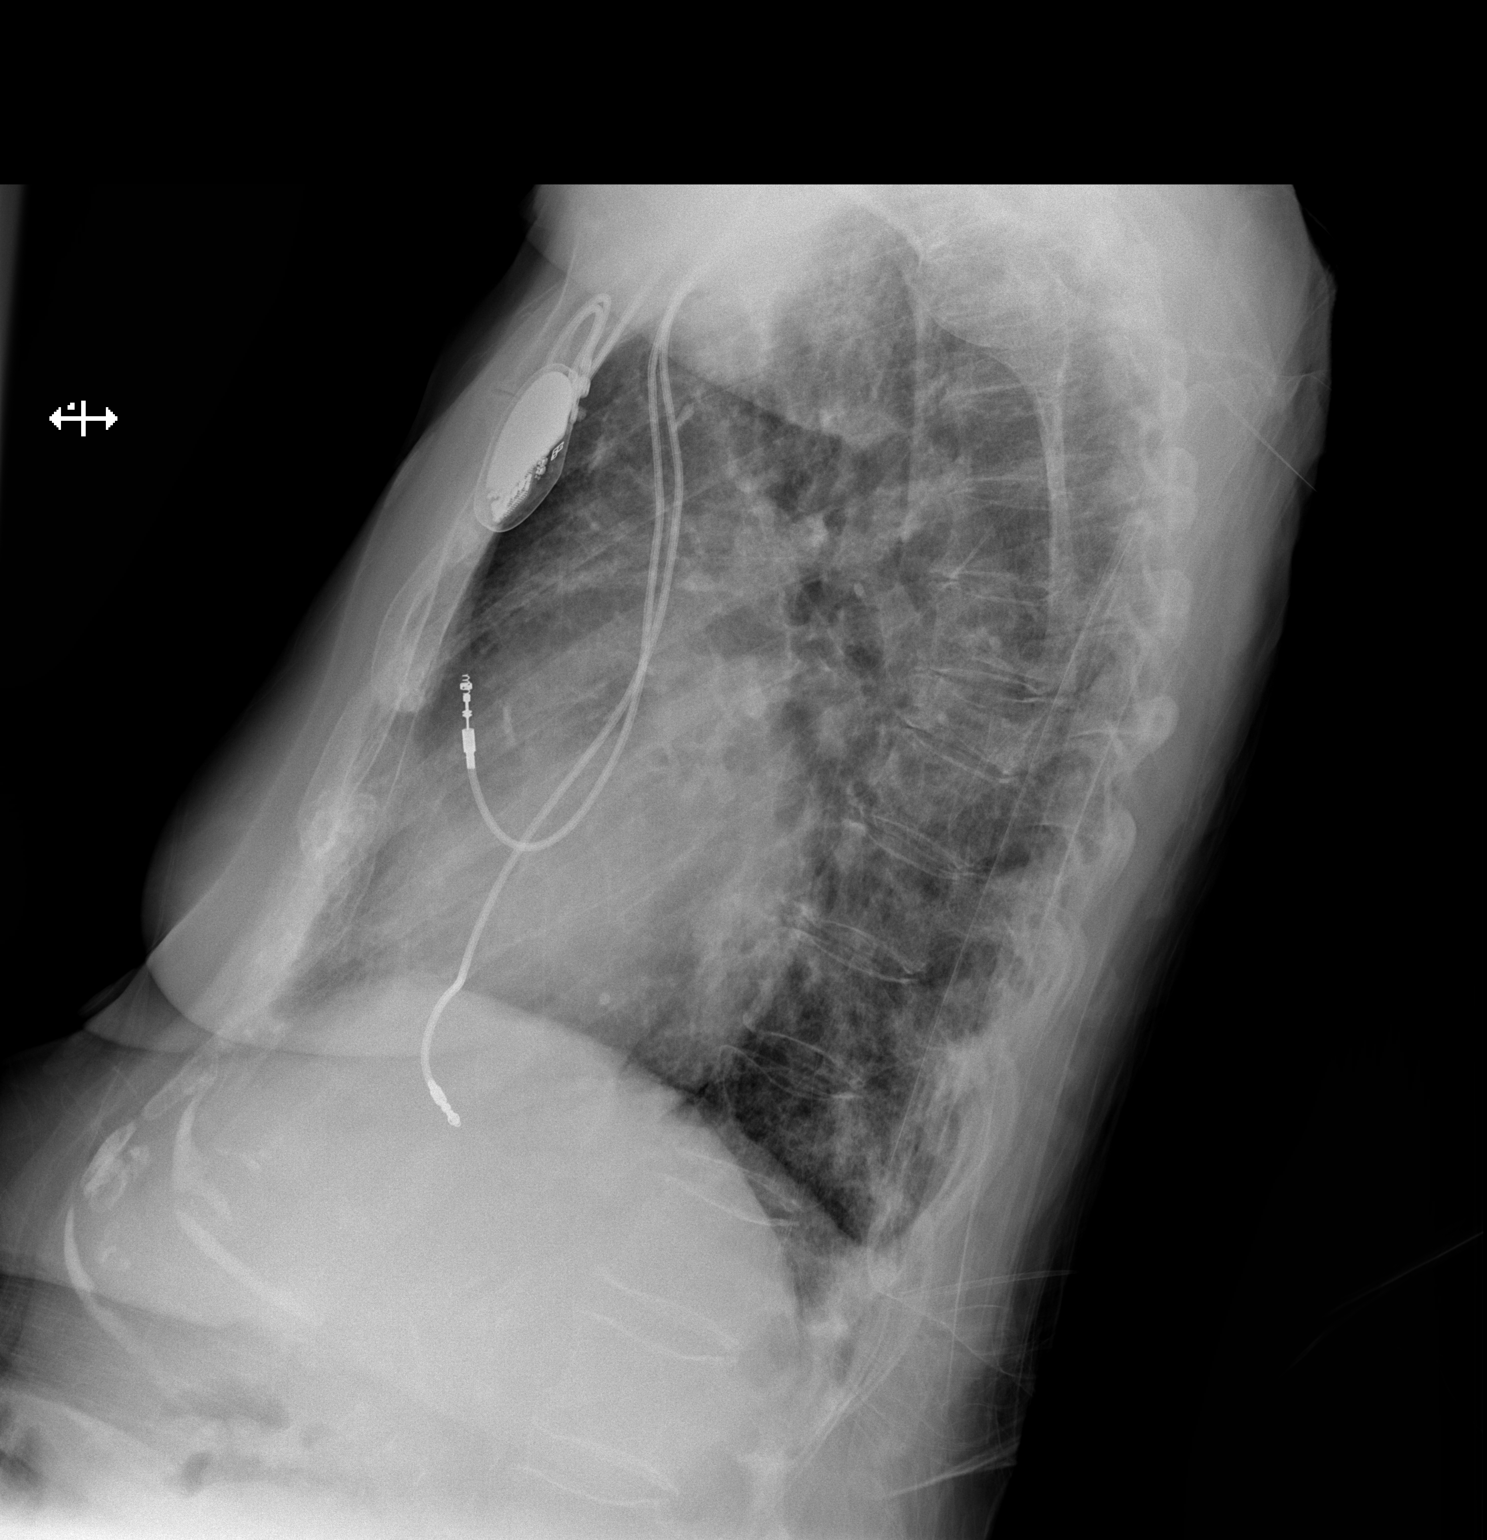

[x chest ap]
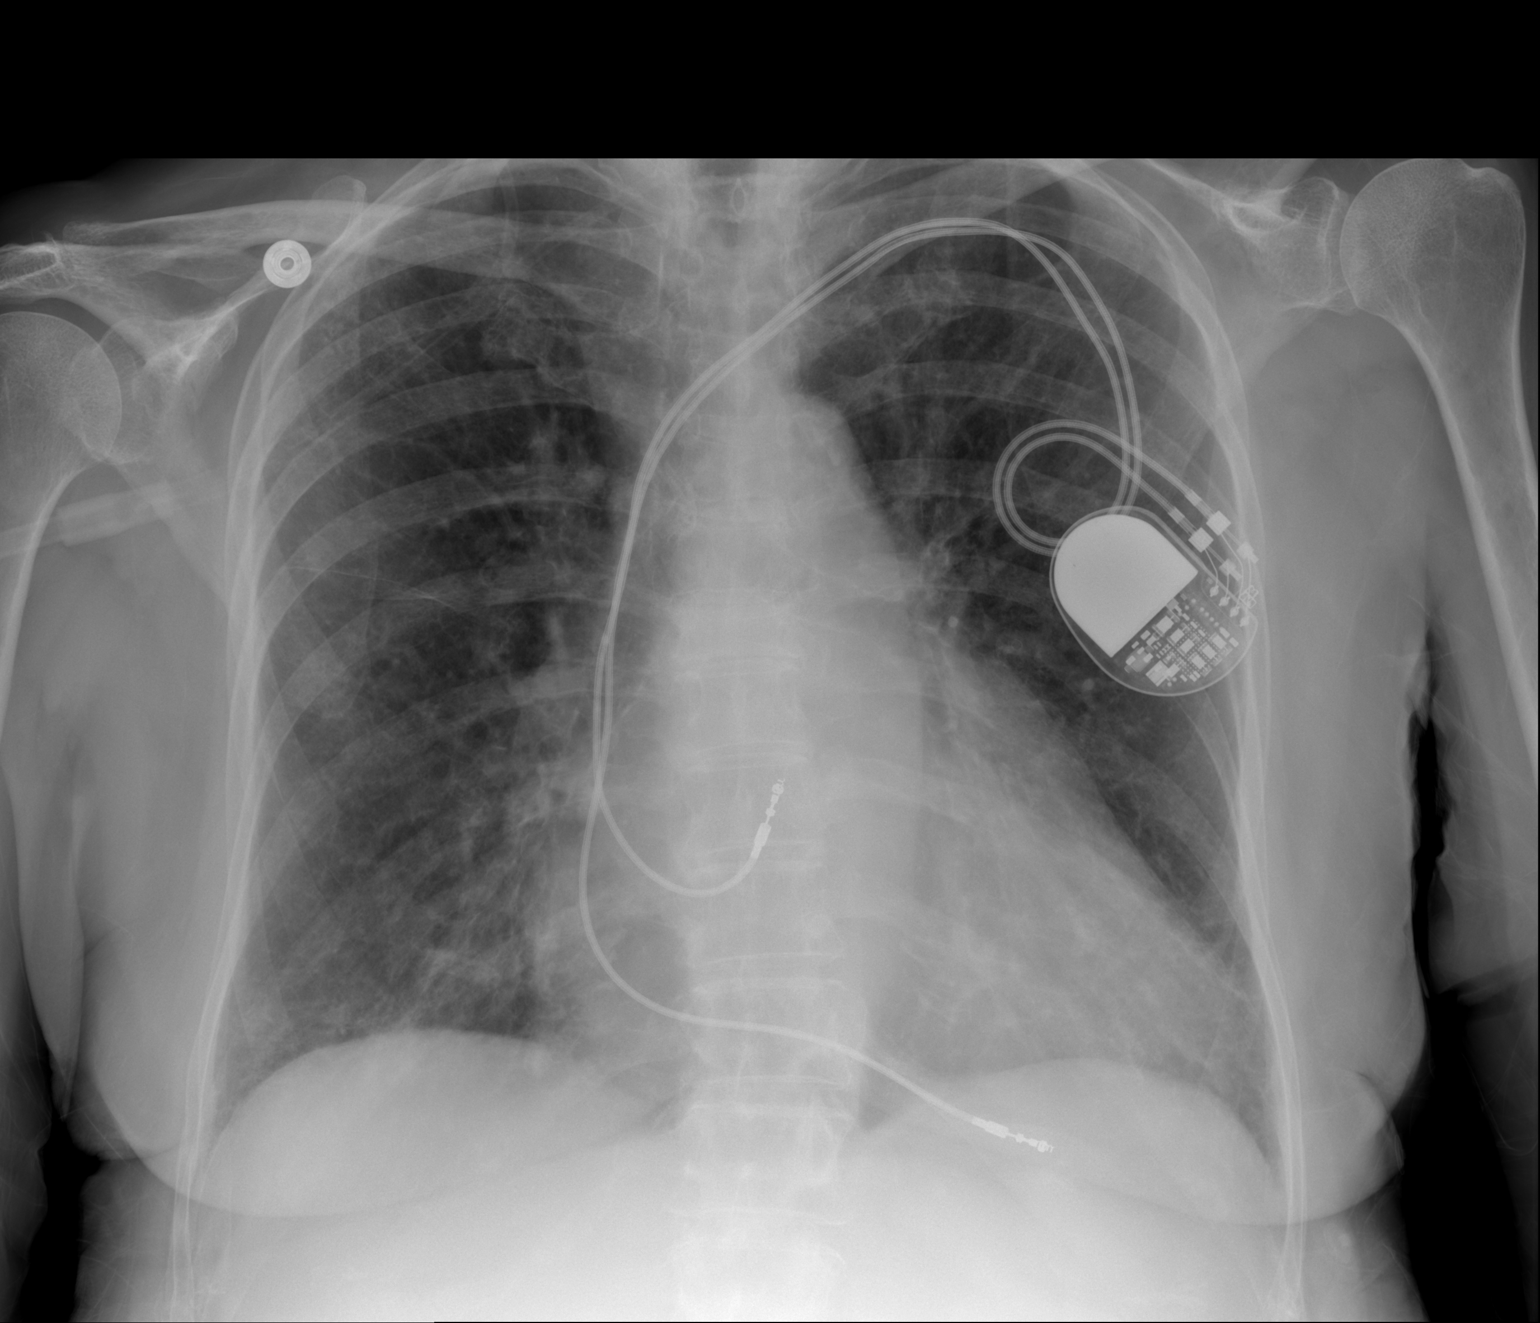

[2 of 2 positions shown; findings below may reference images not displayed]

FINDINGS: Mediastinum and hilar structures normal. Cardiomegaly with normal
pulmonary vascularity. Cardiac pacer with lead tips in the right and
the right ventricle. No pleural effusion or pneumothorax.
Interstitial changes noted consistent with chronic interstitial lung
disease. Degenerative changes lumbar spine.
IMPRESSION: 1. Cardiomegaly. No evidence of overt congestive heart failure.
Cardiac pacer with lead tips in the right atrium and right
ventricle.
2. Mild chronic interstitial lung disease.

## 2015-11-18 IMAGING — CR DG HUMERUS 2V *L*
2 series · 2 of 2 positions shown · non-contrast
Comparison: None.

CLINICAL DATA: Injury, left humerus pain.

EXAM:
LEFT HUMERUS - 2+ VIEW

[w humerus ap left]
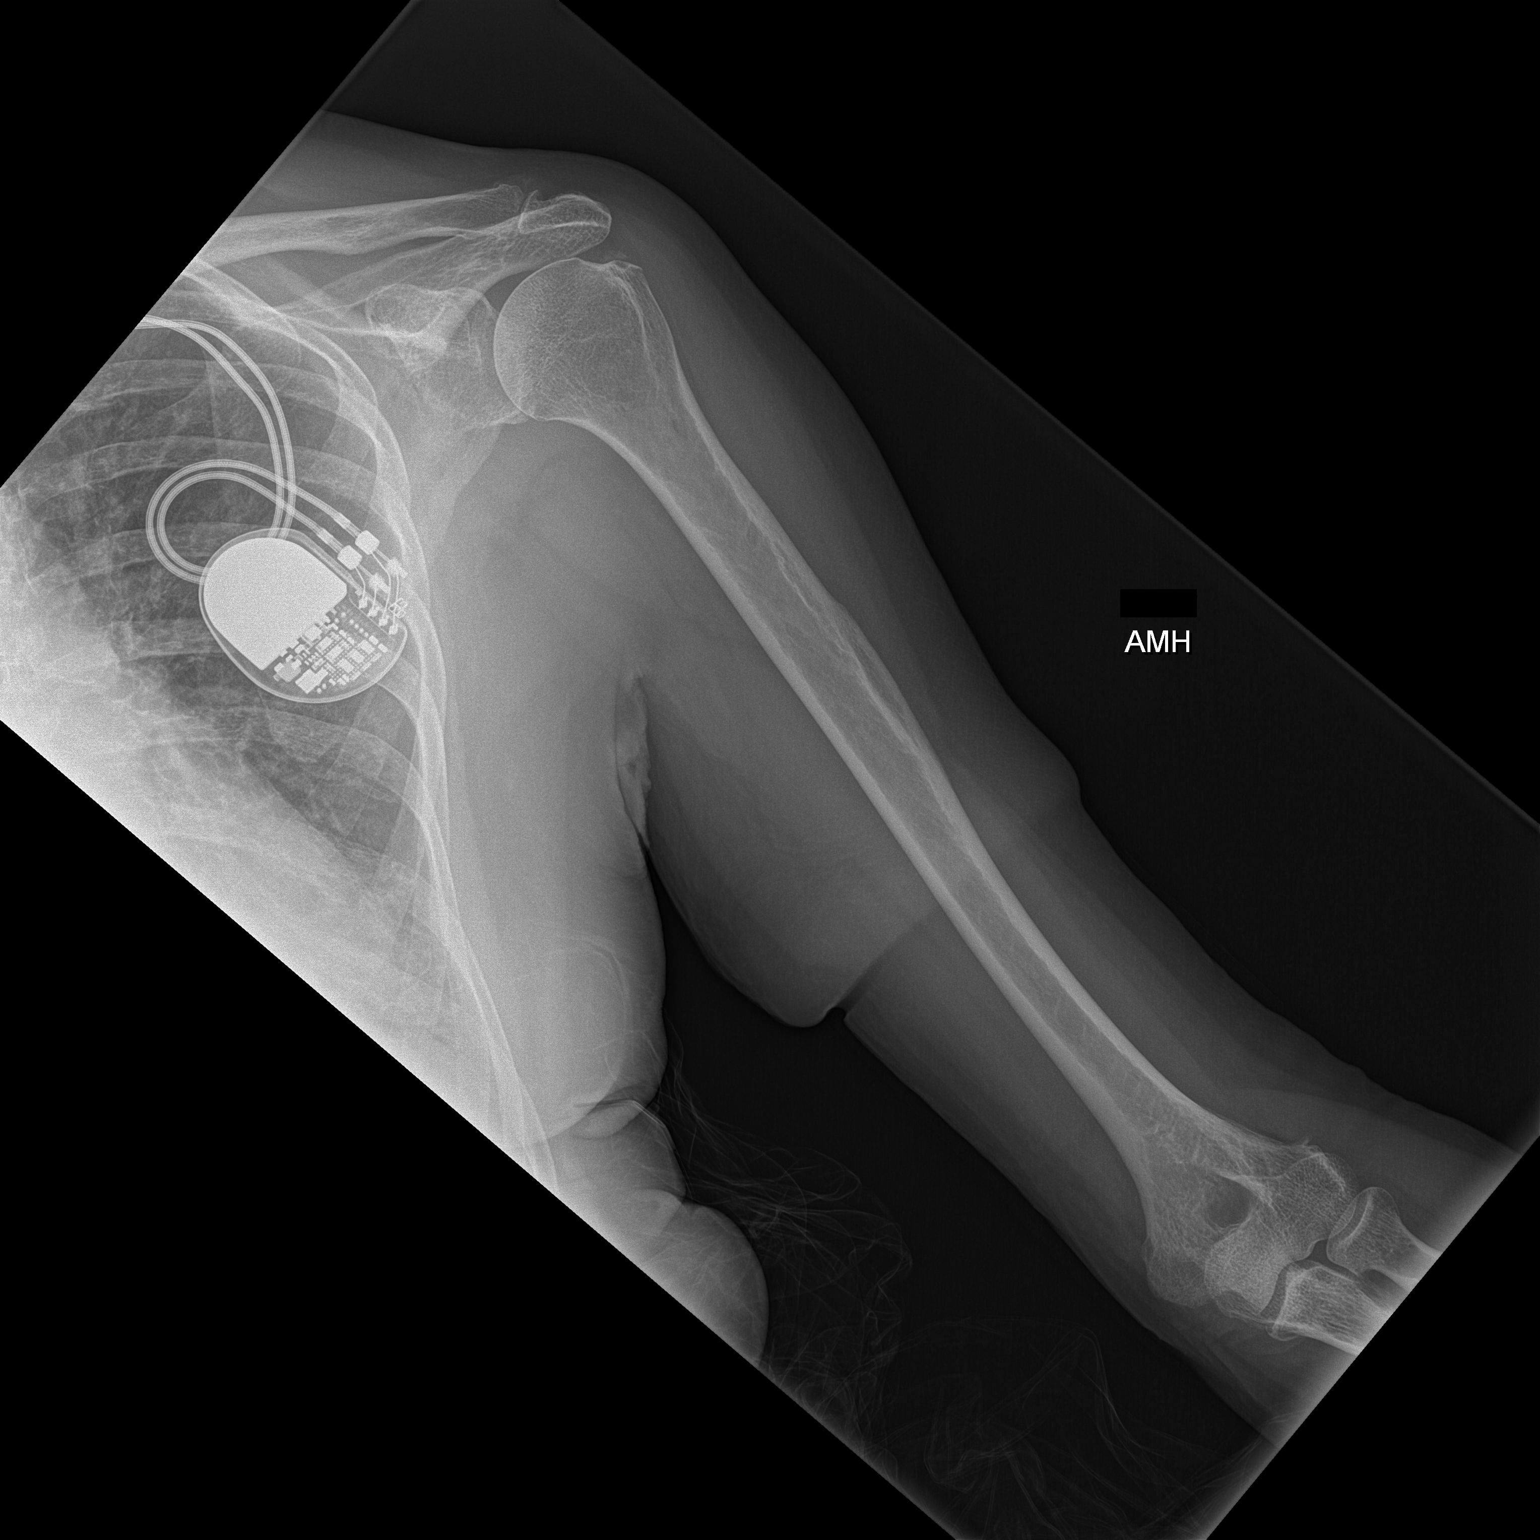

[w humerus lat left]
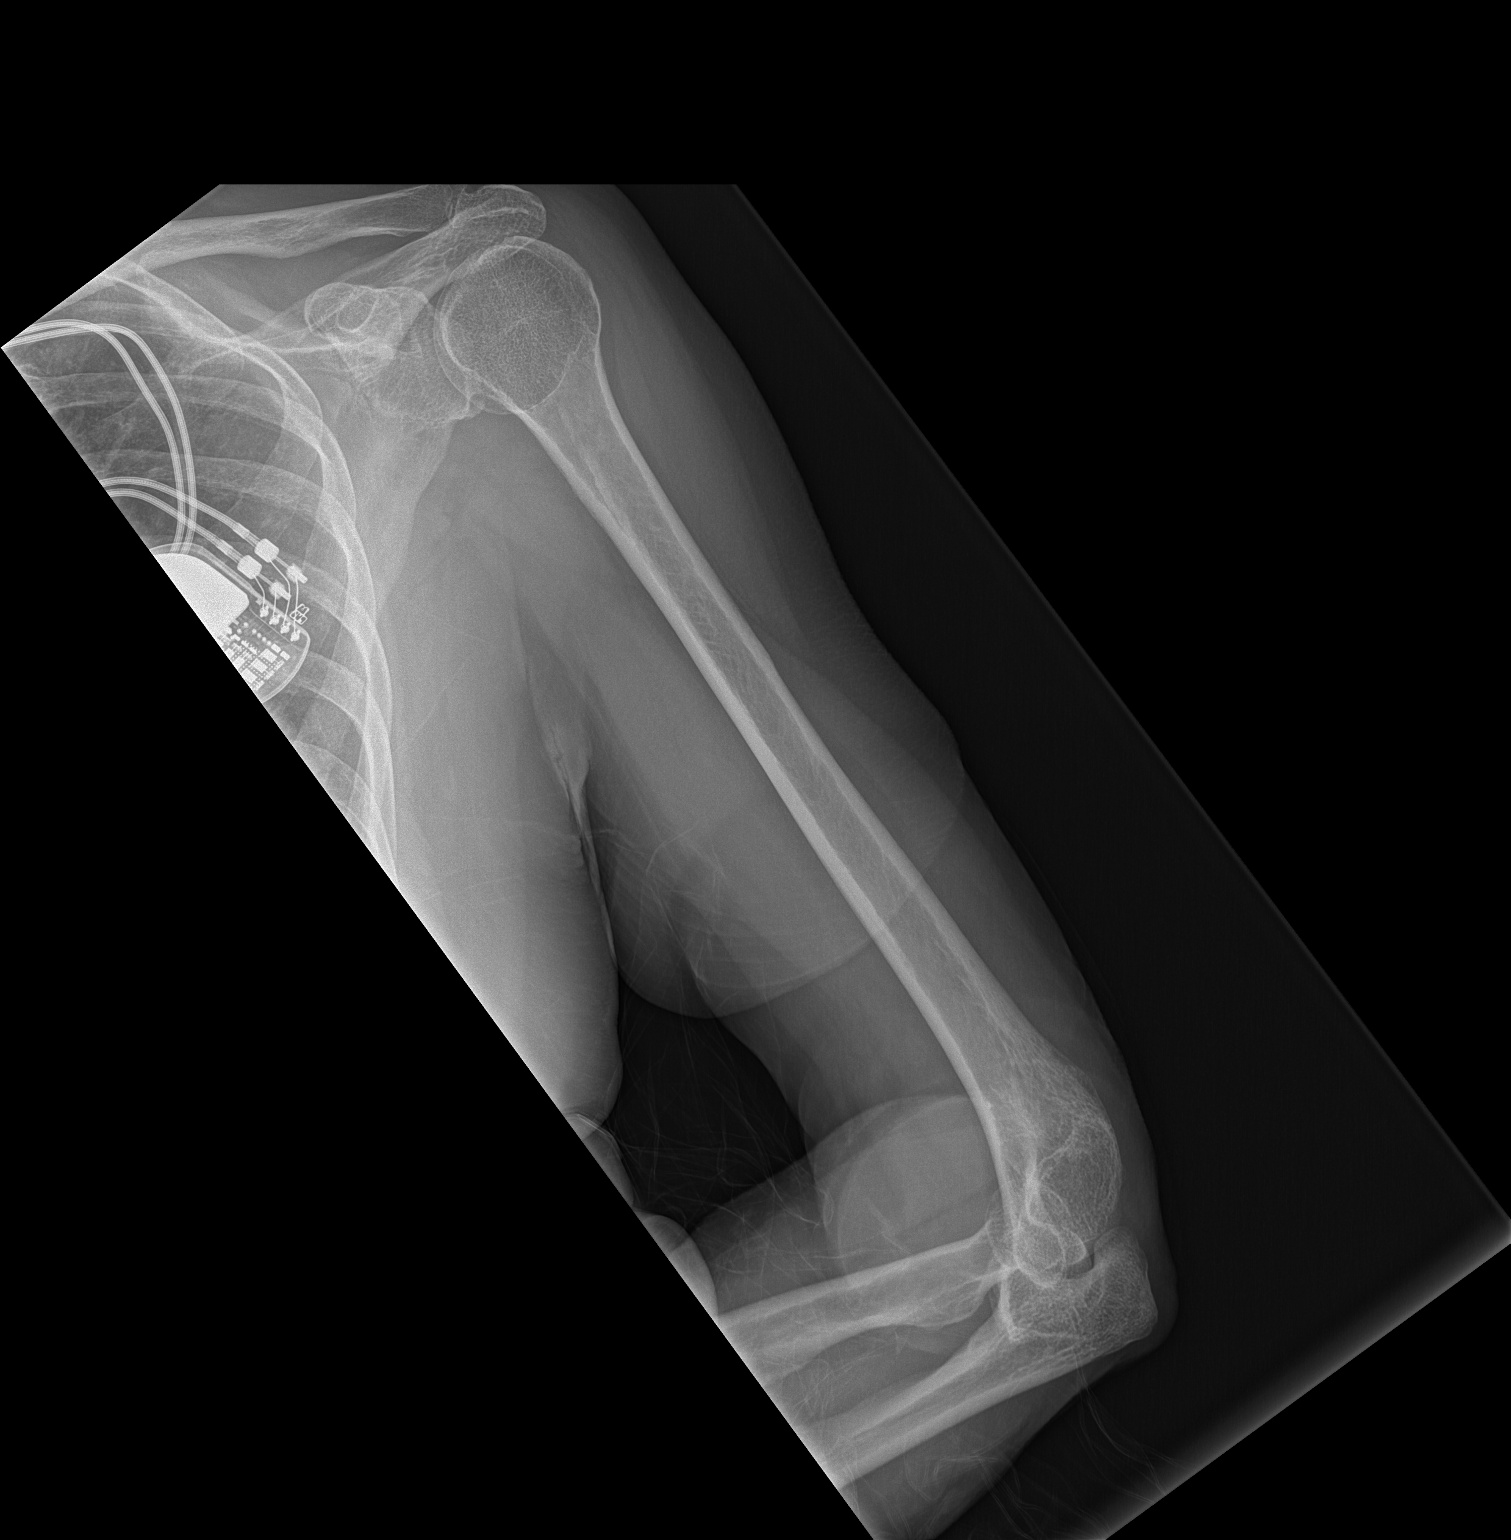

[2 of 2 positions shown; findings below may reference images not displayed]

FINDINGS: There is no acute bony or joint abnormality. Acromioclavicular
degenerative change is noted. Imaged left lung and ribs are
unremarkable.
IMPRESSION: No acute finding.

## 2015-11-25 IMAGING — CT CT HEAD W/O CM
1 series · 15 of 30 positions shown, 19 images · non-contrast
Comparison: 05/21/2014

CLINICAL DATA: Status post fall, large frontal hematoma

EXAM:
CT HEAD WITHOUT CONTRAST
TECHNIQUE: Contiguous axial images were obtained from the base of the skull
through the vertex without intravenous contrast.

[Series 2: head 5.0 h30s · axial · 0.45mm/px · z∈[-99,+51]mm · 15 of 34 slices shown, 19 images]
[im 2/34  brain]
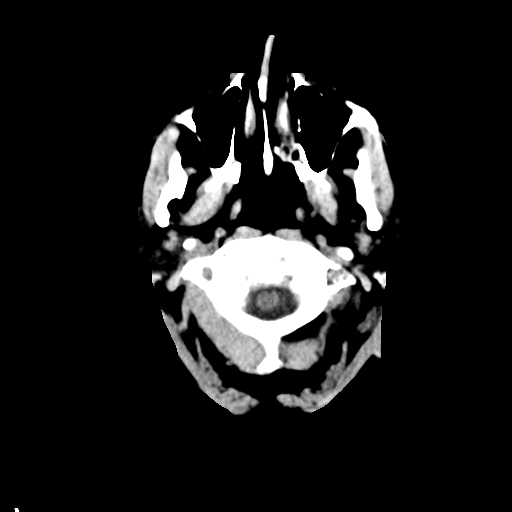
[im 2/34  bone]
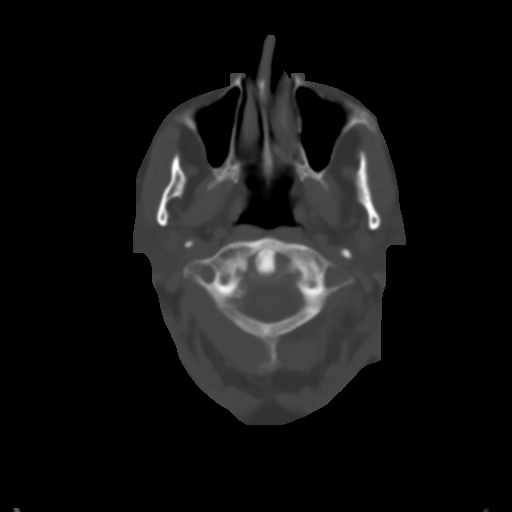
[im 4/34  brain]
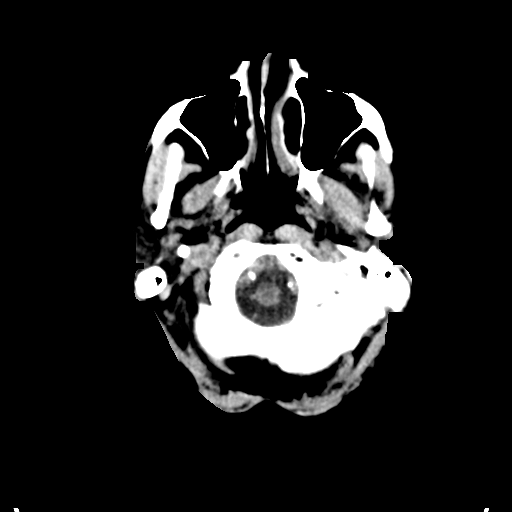
[im 6/34  brain]
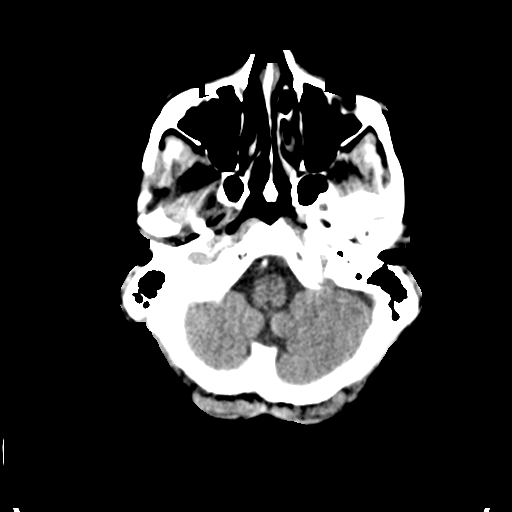
[im 8/34  brain]
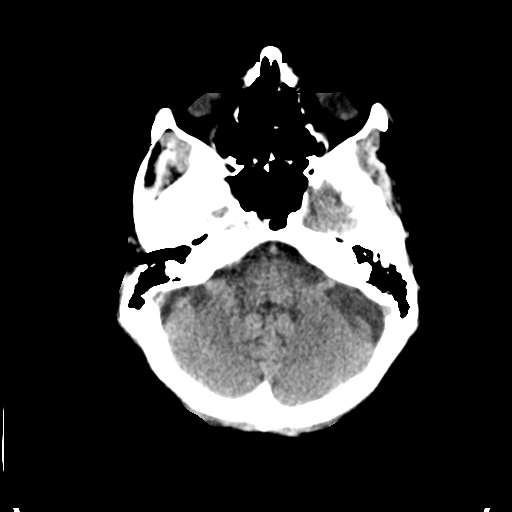
[im 11/34  brain]
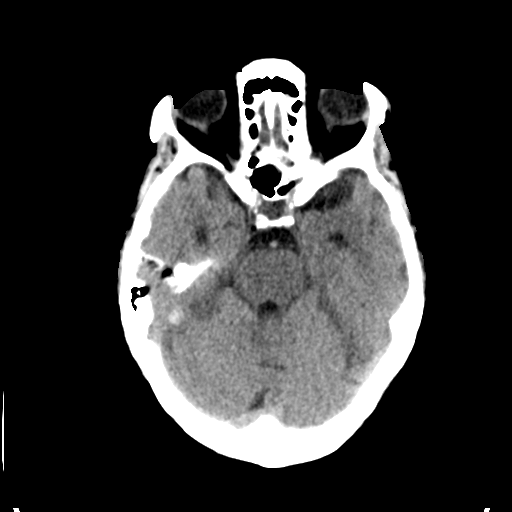
[im 11/34  bone]
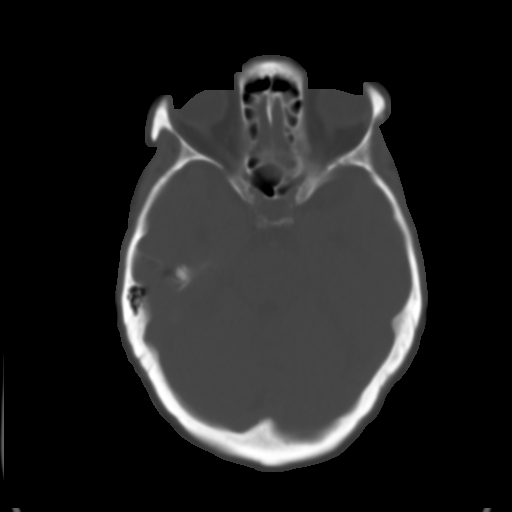
[im 13/34  brain]
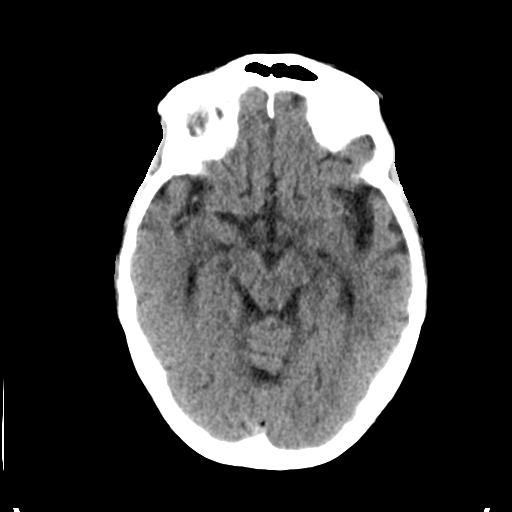
[im 15/34  brain]
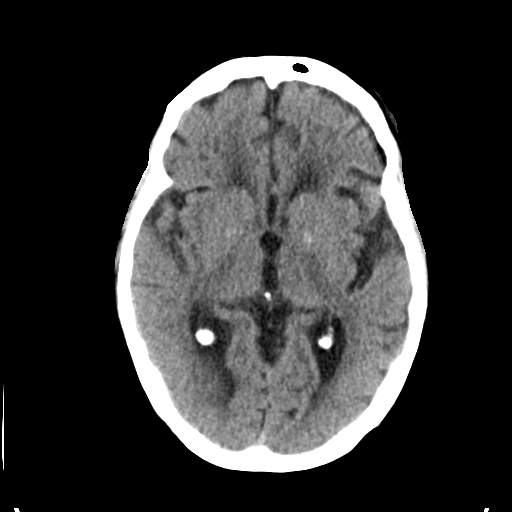
[im 18/34  brain]
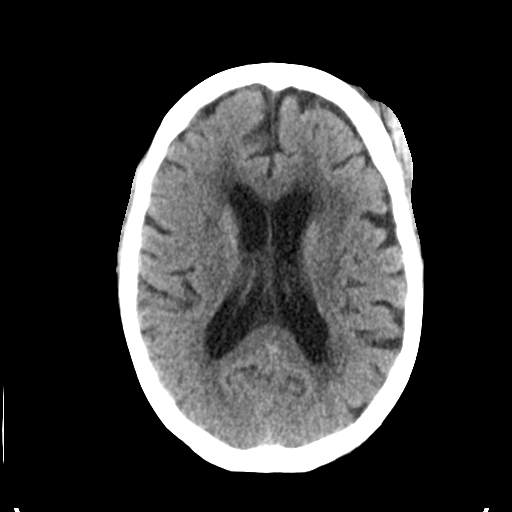
[im 19/34  brain]
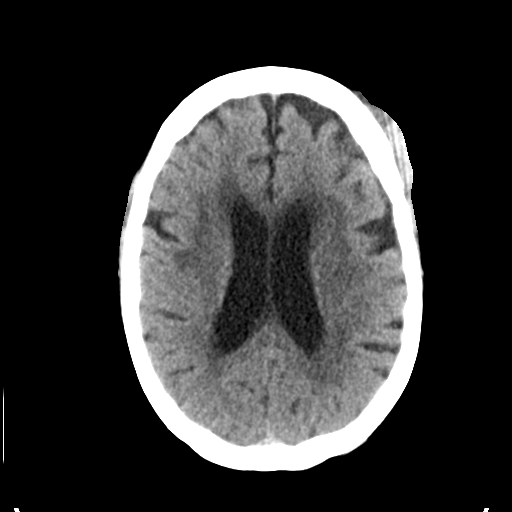
[im 19/34  bone]
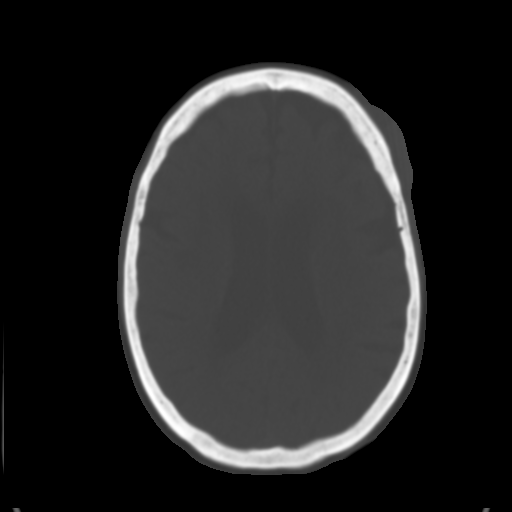
[im 21/34  brain]
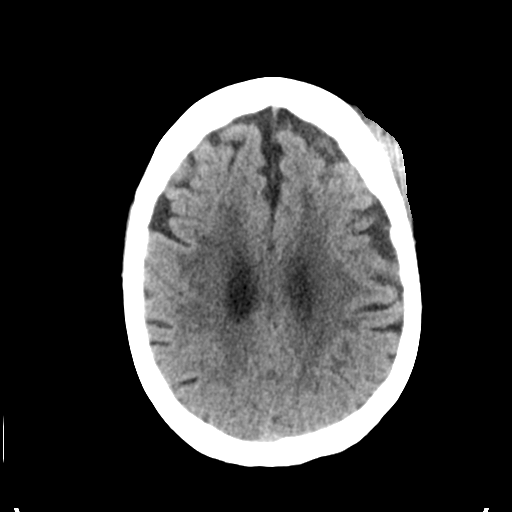
[im 23/34  brain]
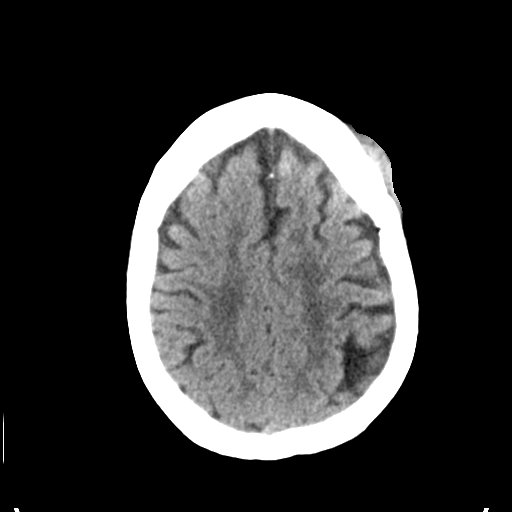
[im 26/34  brain]
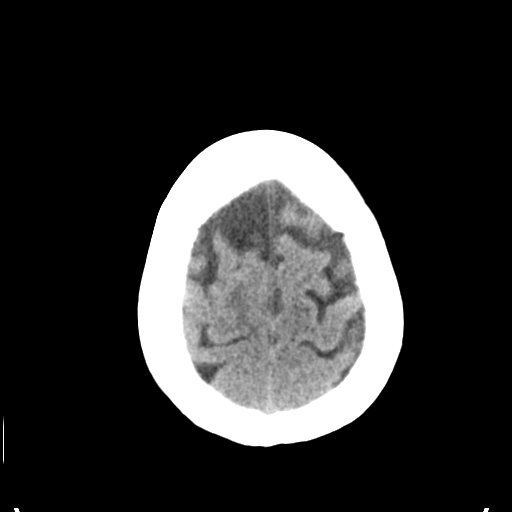
[im 28/34  brain]
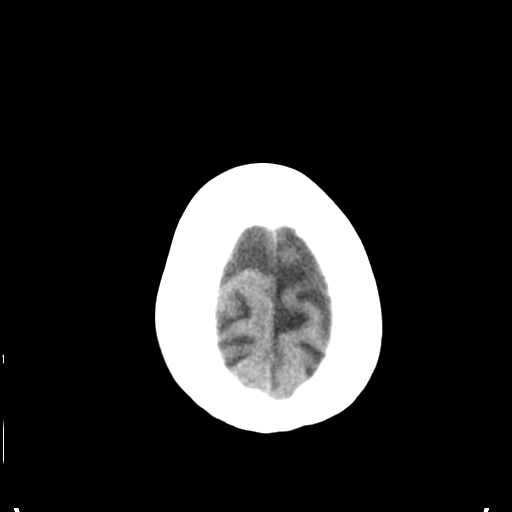
[im 28/34  bone]
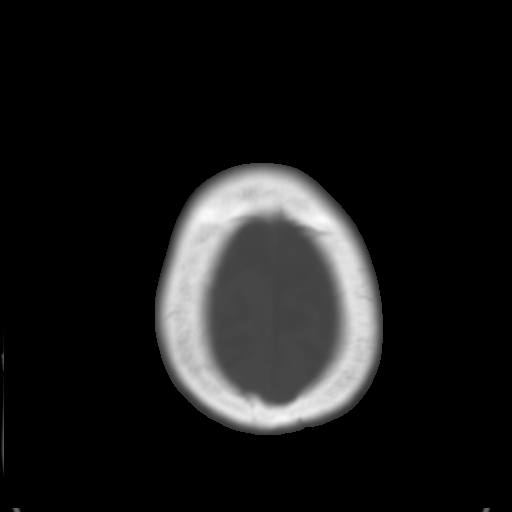
[im 30/34  brain]
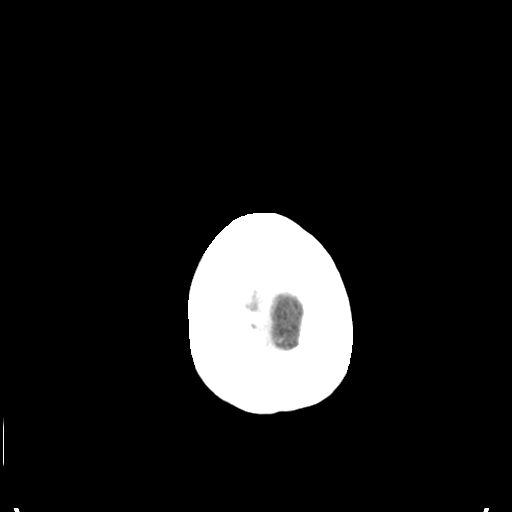
[im 32/34  brain]
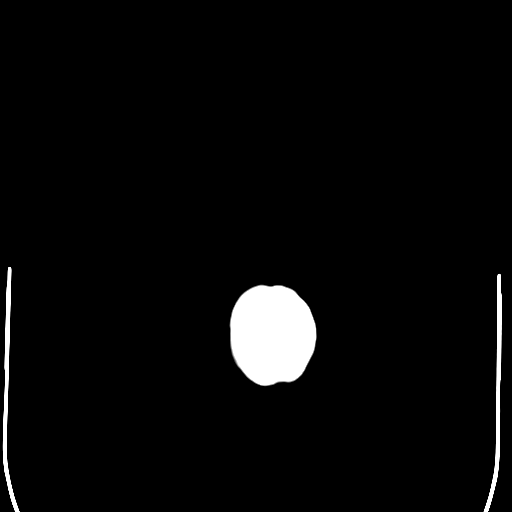

[15 of 30 positions shown; findings below may reference images not displayed]

FINDINGS: There is a small amount of blood along the right side of the
tentorium concerning for a small amount of subdural blood.

There is no evidence of mass effect, midline shift, or extra-axial
fluid collections. There is no evidence of a space-occupying lesion.
There is no evidence of a cortical-based area of acute infarction.
There is generalized cerebral atrophy. There is periventricular
white matter low attenuation likely secondary to microangiopathy.

The ventricles and sulci are appropriate for the patient's age. The
basal cisterns are patent.

Visualized portions of the orbits are unremarkable. The visualized
portions of the paranasal sinuses and mastoid air cells are
unremarkable.

The osseous structures are unremarkable. There is a large left
frontal scalp hematoma.
IMPRESSION: 1. Small amount of subdural hemorrhage along the right side of the
tentorium. These results were called by telephone at the time of
interpretation on 05/28/2014 at [DATE] to Dr. RHONIE KIMO , who
verbally acknowledged these results.
2. Large left frontal scalp hematoma.

## 2015-11-26 IMAGING — CT CT HEAD W/O CM
2 series · 14 of 30 positions shown, 18 images · non-contrast
Comparison: CT of the head performed 05/28/2014

CLINICAL DATA: Frequent falls.  Follow-up subdural hematoma.

EXAM:
CT HEAD WITHOUT CONTRAST
TECHNIQUE: Contiguous axial images were obtained from the base of the skull
through the vertex without intravenous contrast.

[Series 201: head w/o, idose (1) · axial · non-contrast · 0.49mm/px · z∈[+93,+223]mm · 13 of 32 slices shown, 17 images]
[im 3/32  brain]
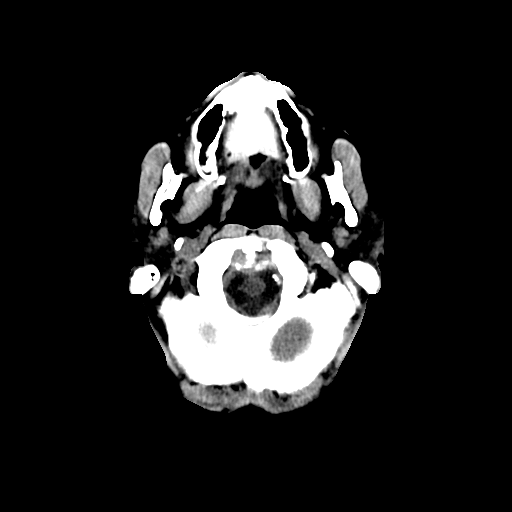
[im 3/32  bone]
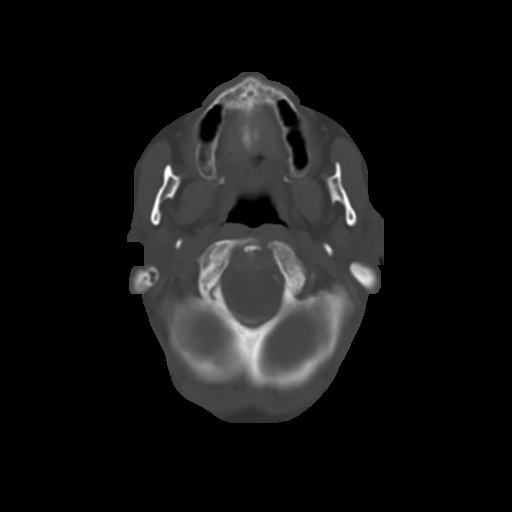
[im 5/32  brain]
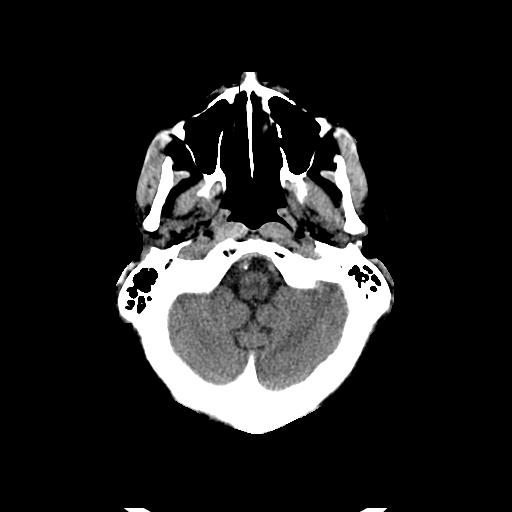
[im 7/32  brain]
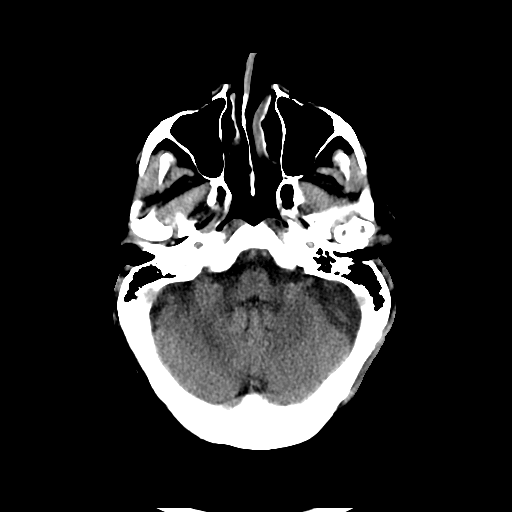
[im 9/32  brain]
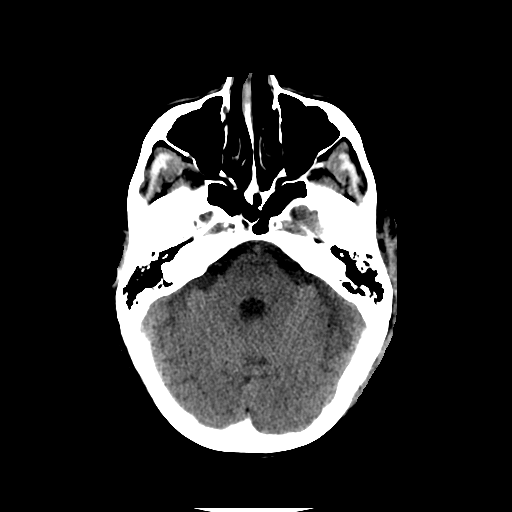
[im 12/32  brain]
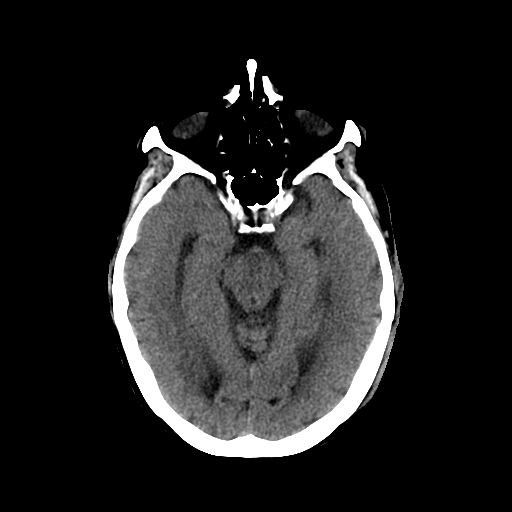
[im 12/32  bone]
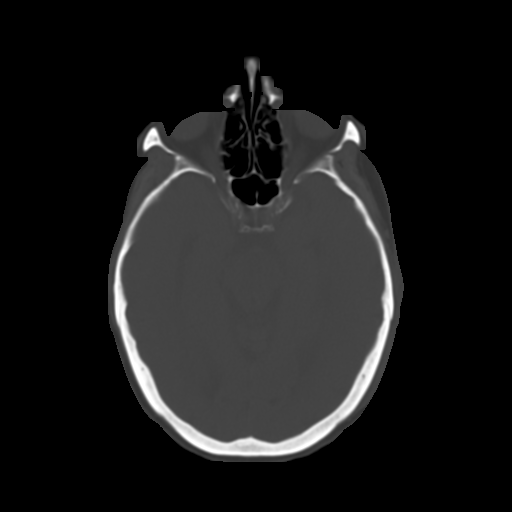
[im 14/32  brain]
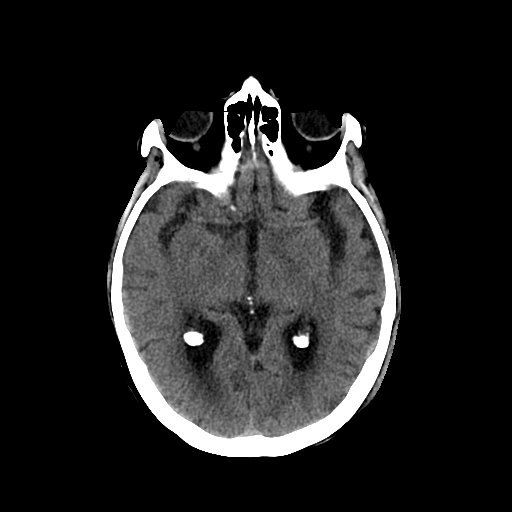
[im 16/32  brain]
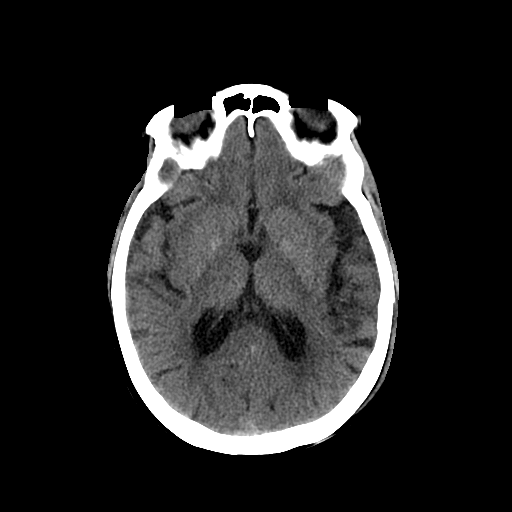
[im 18/32  brain]
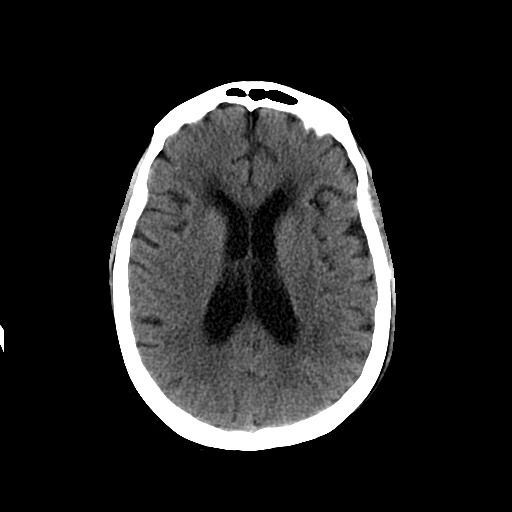
[im 20/32  brain]
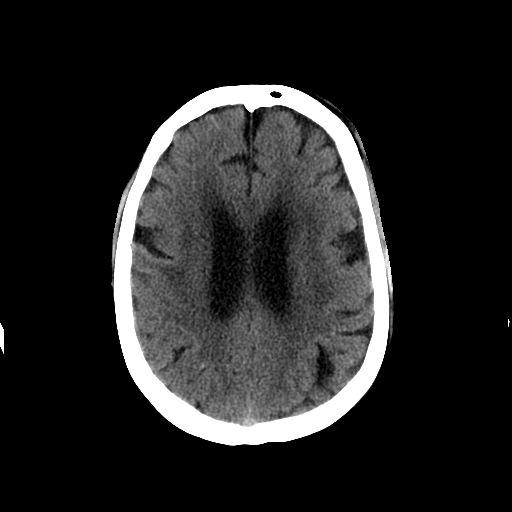
[im 20/32  bone]
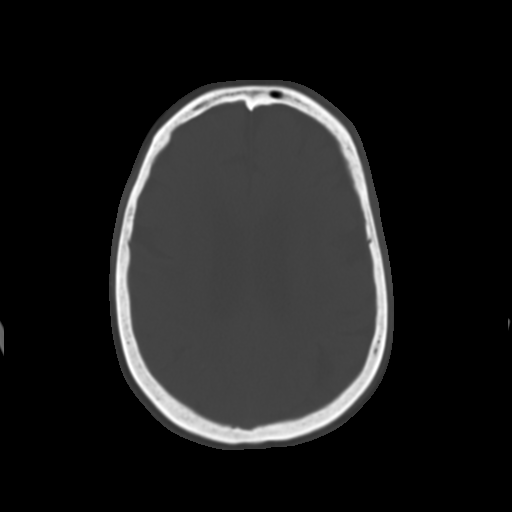
[im 23/32  brain]
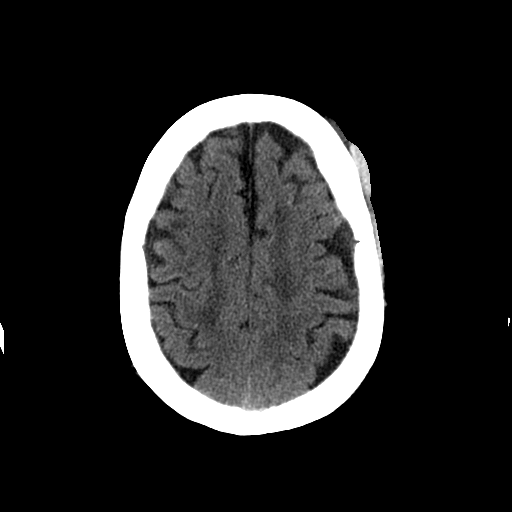
[im 25/32  brain]
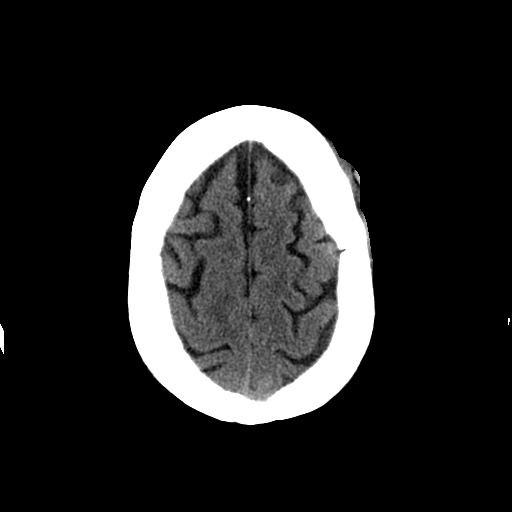
[im 27/32  brain]
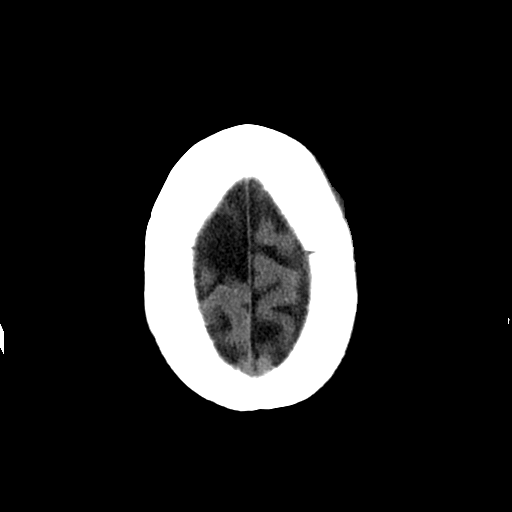
[im 29/32  brain]
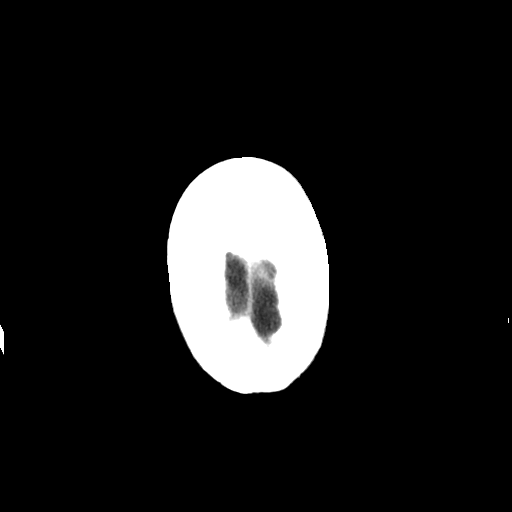
[im 29/32  bone]
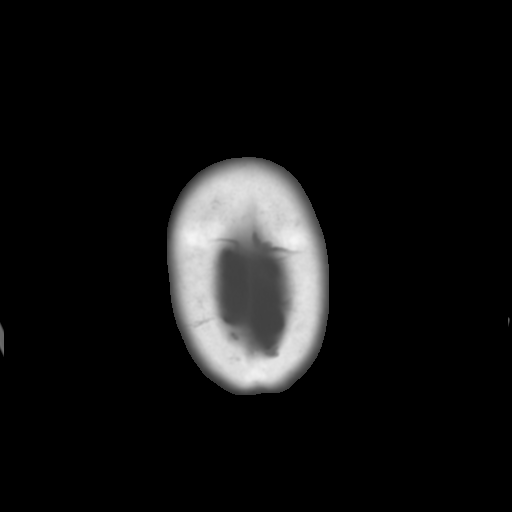

[Series 202: head w/o bone, idose (1) · axial · non-contrast · 0.49mm/px · 1 of 32 slices shown]
[im 3/32  bone]
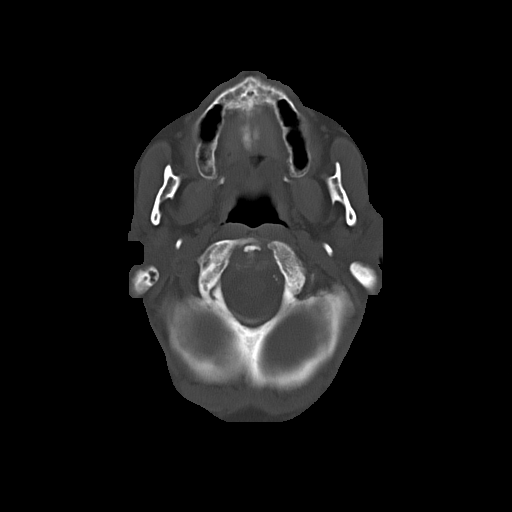

[14 of 30 positions shown; findings below may reference images not displayed]

FINDINGS: The two foci of hemorrhage along the right tentorium cerebelli have
increased in size, measuring 1.6 cm and 0.6 cm. Though these could
reflect subdural hematoma as previously suggested, the
well-circumscribed appearance suggests focal contusions along the
inferior aspect of the right temporal lobe.

There also appears to be relatively stable 9 mm focal gyral
contusion at the anterior aspect of the vertex, on the left side.

Scattered periventricular and subcortical white matter change likely
reflects small vessel ischemic microangiopathy. Prominence of the
ventricles and sulci suggests mild cortical volume loss.

The posterior fossa, including the cerebellum, brainstem and fourth
ventricle, is within normal limits. The third and lateral
ventricles, and basal ganglia are unremarkable in appearance. No
midline shift is seen.

There is no evidence of fracture; visualized osseous structures are
unremarkable in appearance. The orbits are within normal limits. The
paranasal sinuses and mastoid air cells are well-aerated. A
prominent scalp hematoma is noted overlying the left frontoparietal
calvarium.
IMPRESSION: 1. Two foci of hemorrhage along the right tentorium cerebelli have
increased in size, measuring 1.6 cm and 0.6 cm. Though these could
reflect subdural hematoma as previously suggested, the
well-circumscribed appearance suggests focal contusions along the
inferior aspect of the right temporal lobe.
2. Relatively stable appearance to acute 9 mm focal gyral
intraparenchymal contusion at the anterior aspect of the vertex on
the left side.
3. Scattered small vessel ischemic microangiopathy and mild cortical
volume loss.
4. Prominent scalp hematoma overlying the left frontoparietal
calvarium.

These results were called by telephone at the time of interpretation
on 05/29/2014 at [DATE] to Dr. Matheri Thiery Lihle, who verbally
acknowledged these results.

## 2015-12-19 IMAGING — CR DG SACRUM/COCCYX 2+V
3 series · 3 of 3 positions shown · non-contrast
Comparison: None.

CLINICAL DATA: Fall with sacral pain.

EXAM:
SACRUM AND COCCYX - 2+ VIEW

[t sacrum ap]
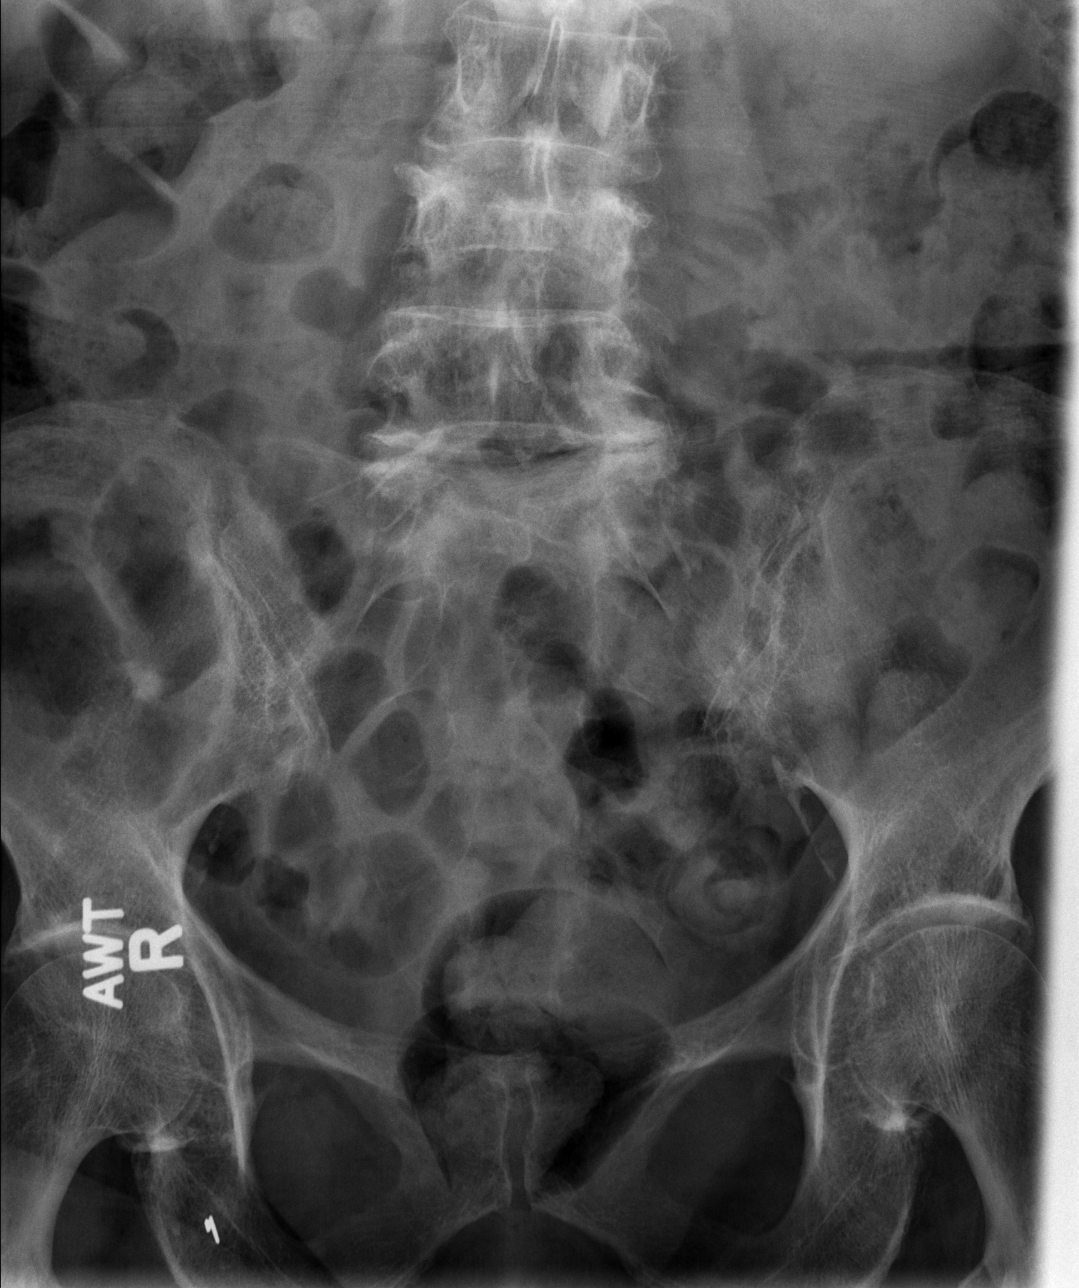

[t coccyx ap]
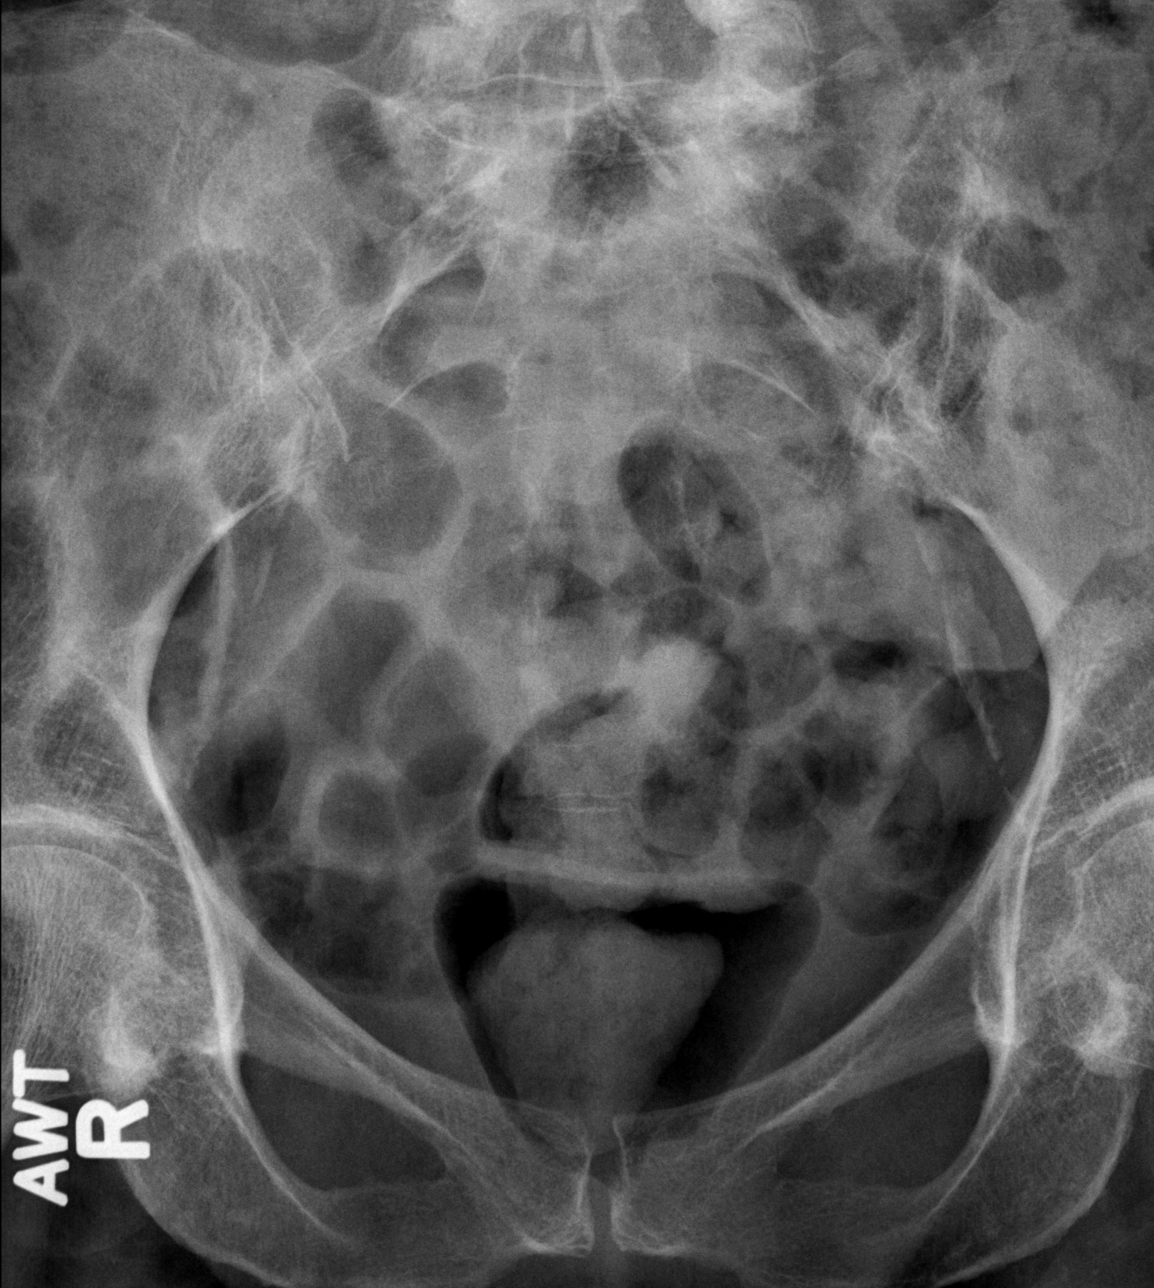

[t sacrum coccyx lat]
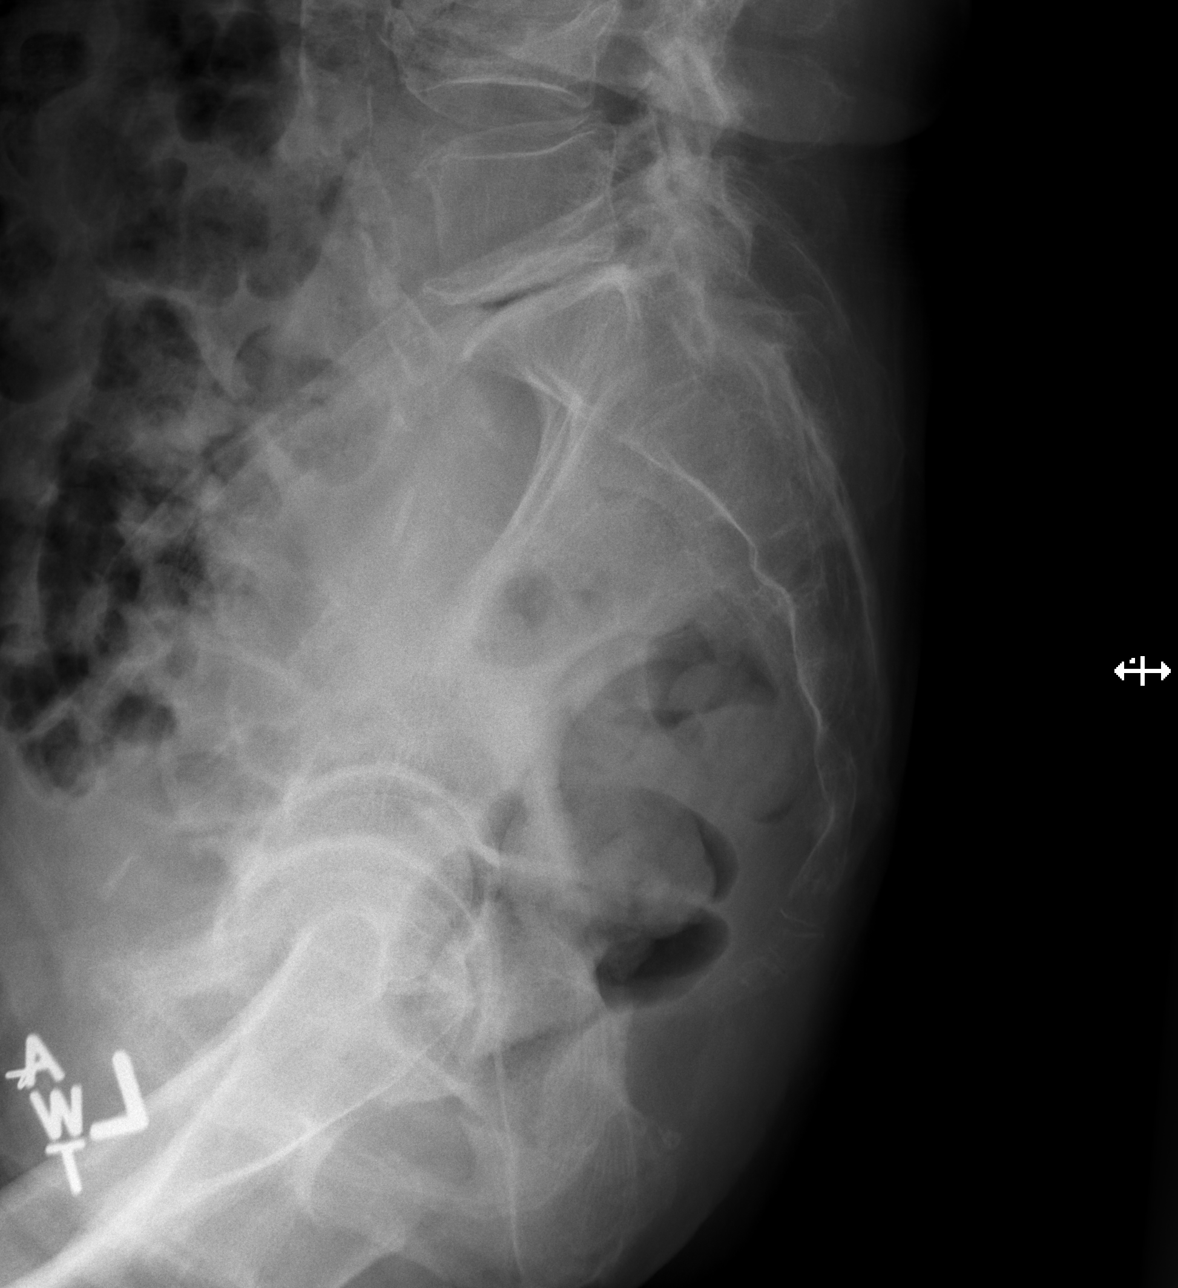

[3 of 3 positions shown; findings below may reference images not displayed]

FINDINGS: No acute sacral or coccygeal fracture is visualized. No evidence of
coccygeal dislocation. Moderate spondylosis present at L5-S1. There
does appear to be a compression fracture at L4 which was seen
previously.
IMPRESSION: No acute sacral or coccygeal fracture identified.

## 2015-12-19 IMAGING — CT CT CERVICAL SPINE W/O CM
1 of 3 series · 6 of 14 positions shown, 8 images · non-contrast
Comparison: Prior CT from 05/29/2014

CLINICAL DATA: Fall

EXAM:
CT HEAD WITHOUT CONTRAST
CT CERVICAL SPINE WITHOUT CONTRAST
TECHNIQUE: Multidetector CT imaging of the head and cervical spine was
performed following the standard protocol without intravenous
contrast. Multiplanar CT image reconstructions of the cervical spine
were also generated.

[Series 4: c-spine st · axial · 0.35mm/px · z∈[-136,-22]mm · 6 of 81 slices shown, 8 images]
[im 12/81  soft-tissue]
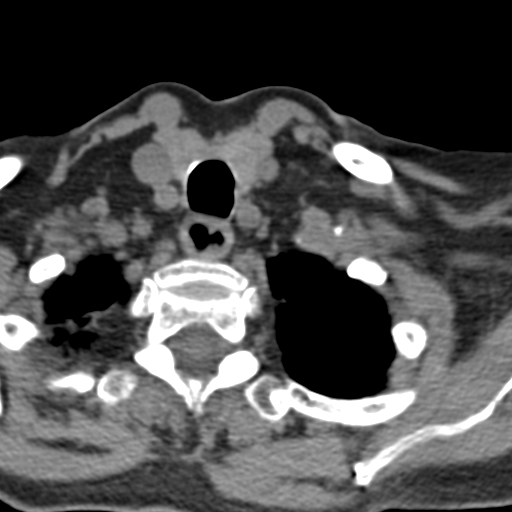
[im 12/81  bone]
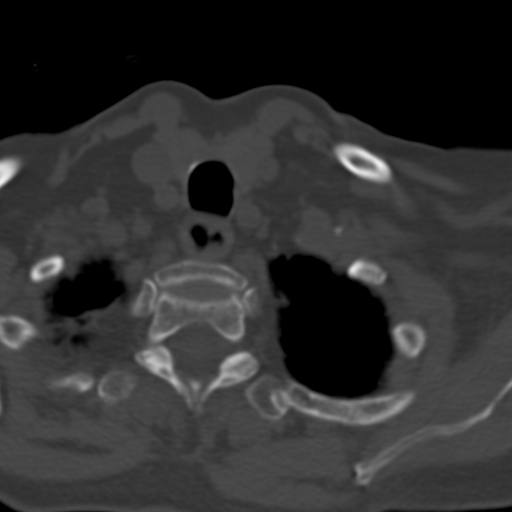
[im 23/81  bone]
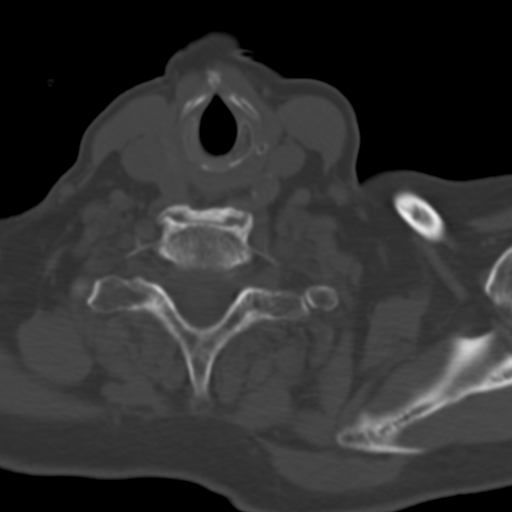
[im 35/81  bone]
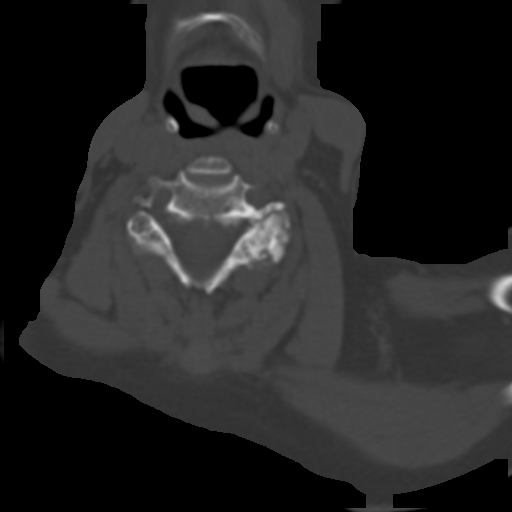
[im 46/81  bone]
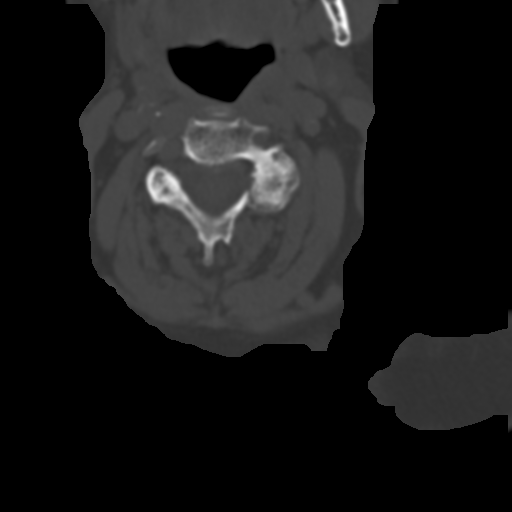
[im 58/81  soft-tissue]
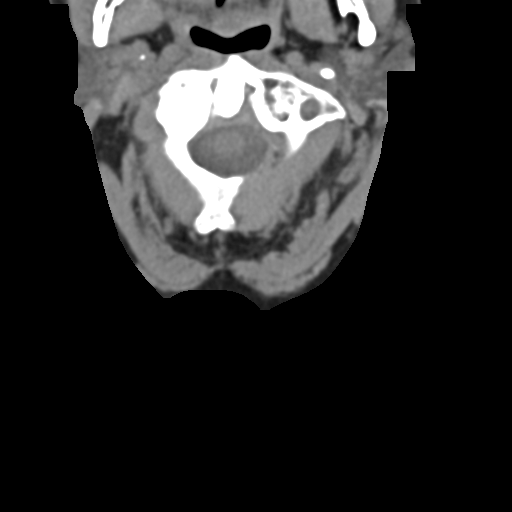
[im 58/81  bone]
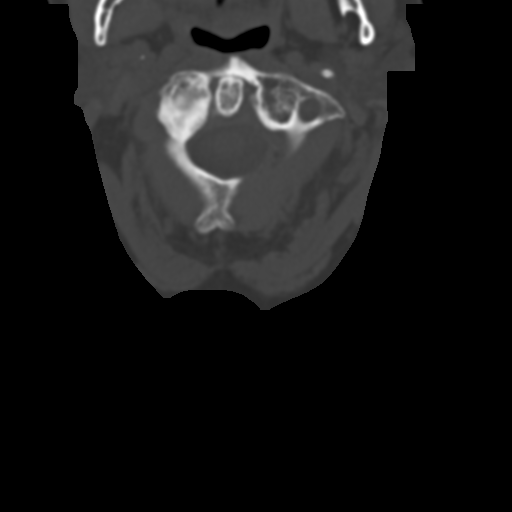
[im 69/81  bone]
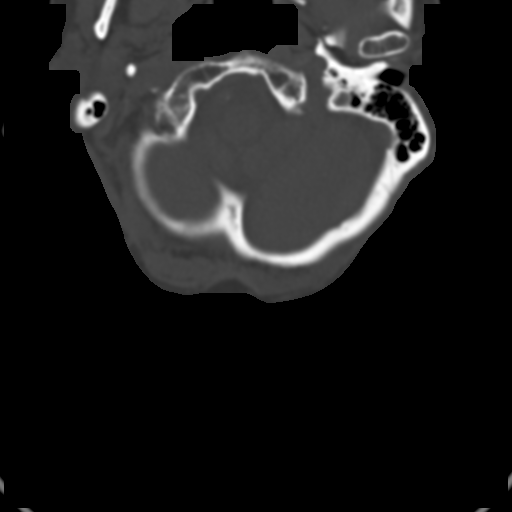

[6 of 14 positions shown; findings below may reference images not displayed]

FINDINGS: CT HEAD FINDINGS

Left frontal scalp contusion is present. Second small contusion seen
at the posterior scalp near the vertex. No underlying calvarial
fracture.

Previously seen acute tentorial hemorrhages have resolved. No acute
intracranial hemorrhage or large vessel territory infarct. Atrophy
with chronic microvascular ischemic changes again seen. No mass
lesion, midline shift, or hydrocephalus. No extra-axial fluid
collection.

No acute abnormality seen about either orbit.

Paranasal sinuses and mastoid air cells are clear.

CT CERVICAL SPINE FINDINGS

There is straightening of the normal cervical lordosis, which may be
related to patient positioning. Vertebral body heights are
preserved. Normal C1-2 articulations are intact. No prevertebral
soft tissue swelling. No acute fracture or listhesis.

Moderate multilevel degenerative disc disease as evidenced by
intervertebral disc space narrowing and endplate osteophytosis seen
at C5-6 and C6-7. Multilevel facet arthropathy present within the
cervical spine, most severe at C3-4, C4-5 and C5-6 on the left.

Visualized soft tissues of the neck are within normal limits.
Visualized lung apices are clear without evidence of apical
pneumothorax.
IMPRESSION: CT BRAIN:

1. No acute intracranial process.
2. Left frontal and posterior scalp contusions.
3. Interval resolution of recently seen subdural hemorrhage.
4. Atrophy with chronic small vessel ischemic disease.

CT CERVICAL SPINE:

No acute traumatic injury within the cervical spine.

## 2015-12-19 IMAGING — CR DG LUMBAR SPINE COMPLETE 4+V
5 series · 5 of 5 positions shown · non-contrast
Comparison: 05/05/2014

CLINICAL DATA: Fall, low back pain

EXAM:
LUMBAR SPINE - COMPLETE 4+ VIEW

[t lumbar spine ap]
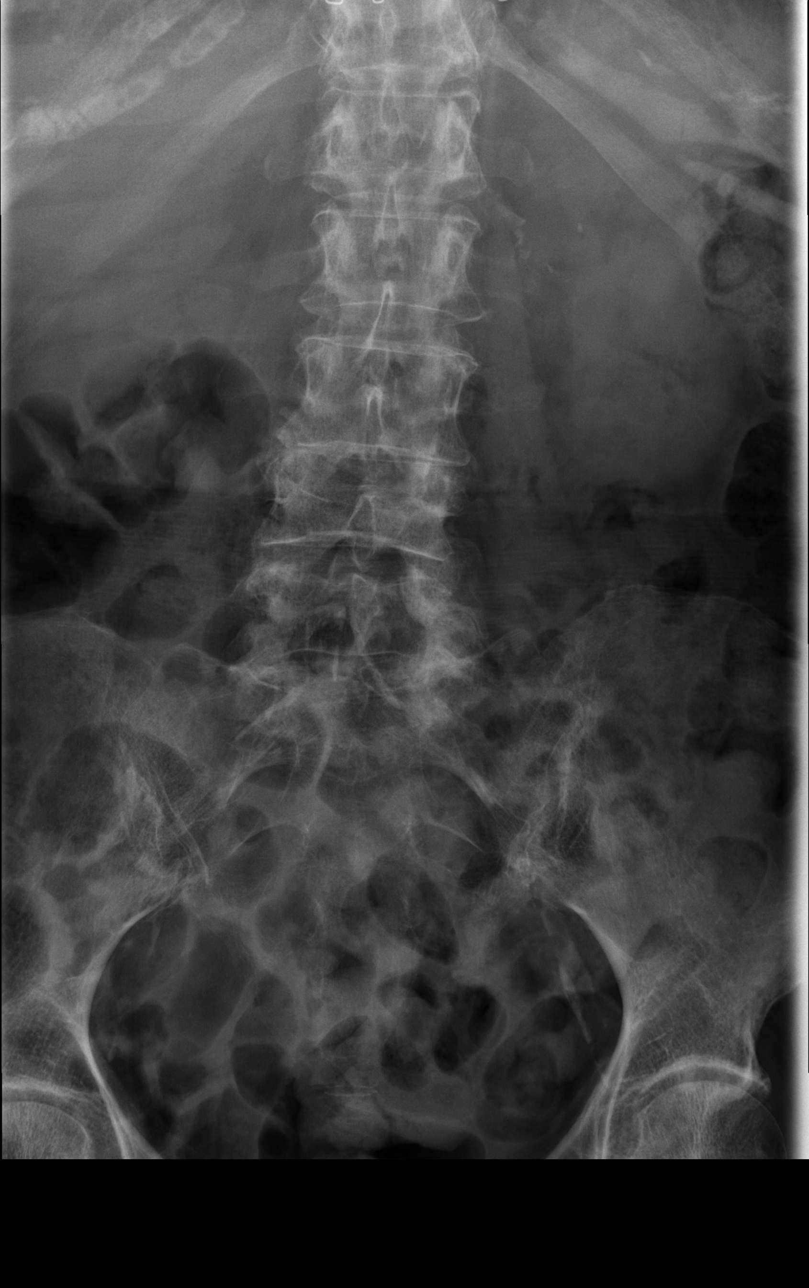

[t lumbar spine obl (1 of 2)]
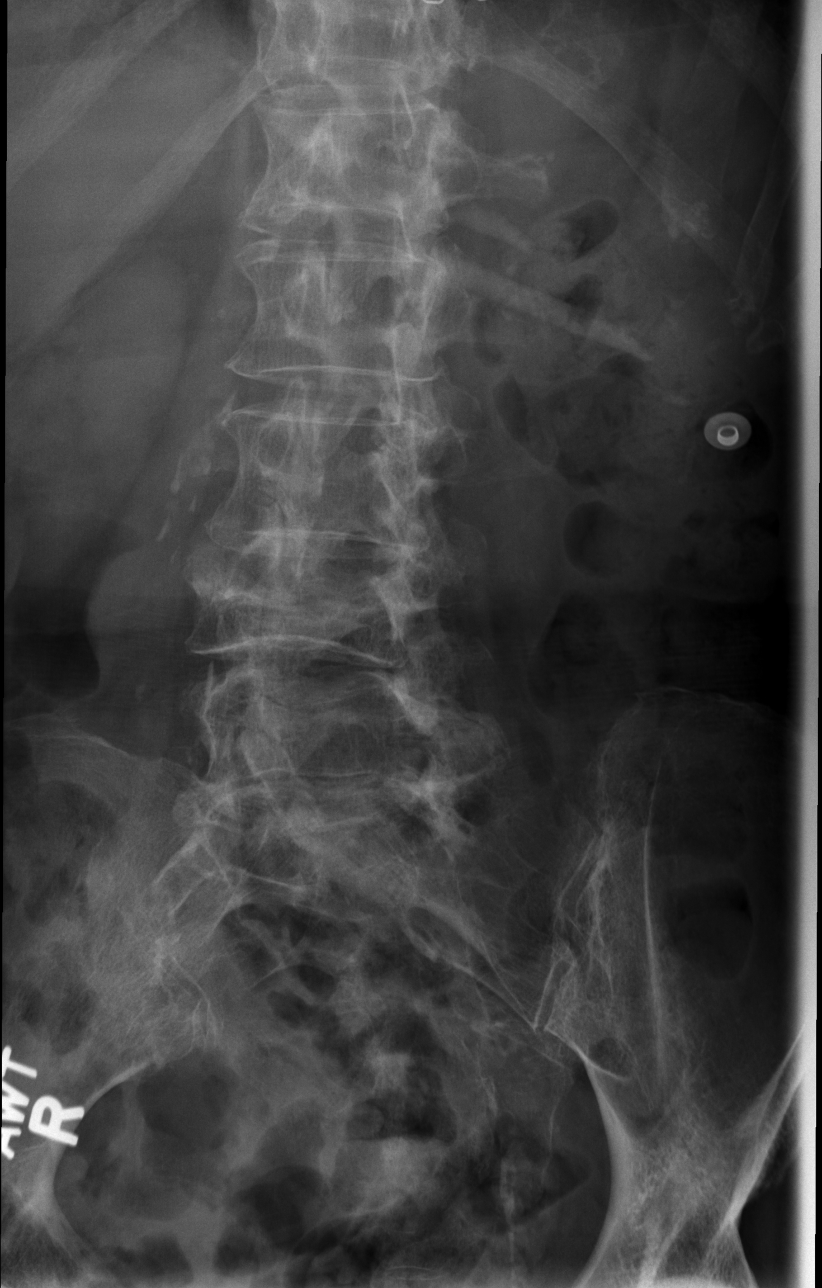

[t lumbar spine obl (2 of 2)]
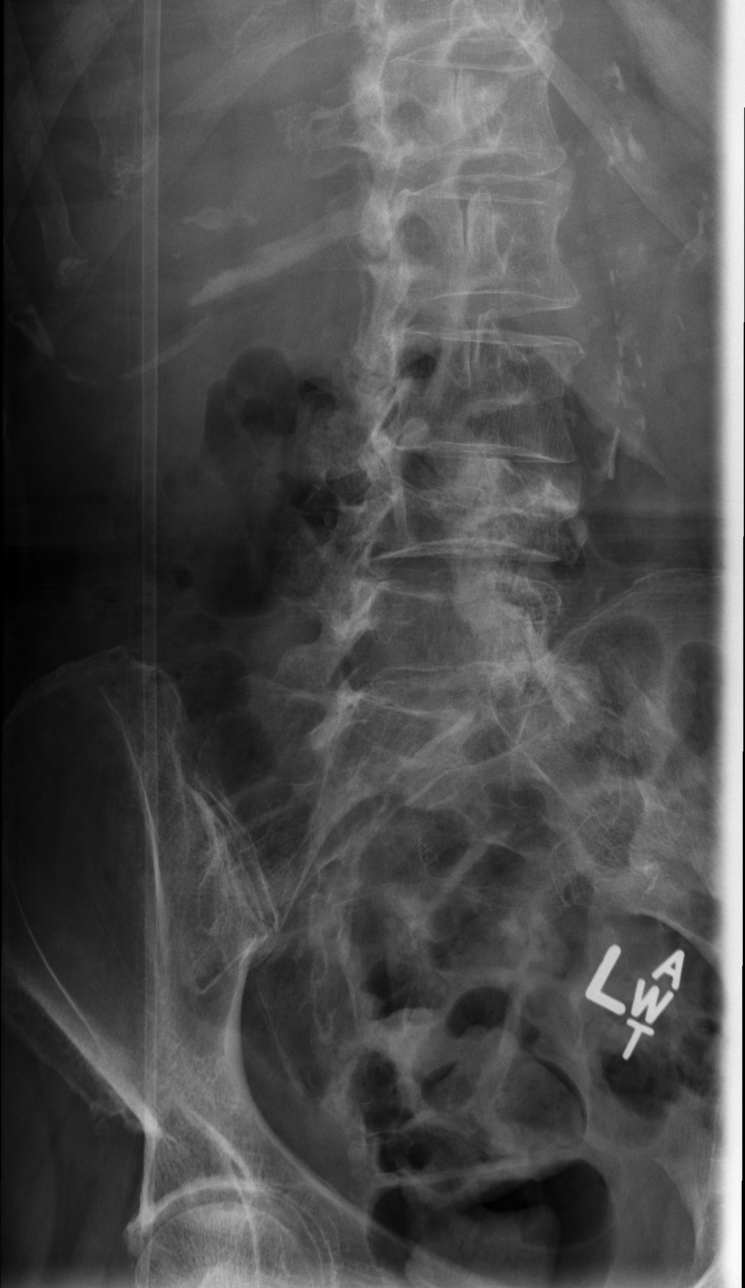

[t lumbar spine lat]
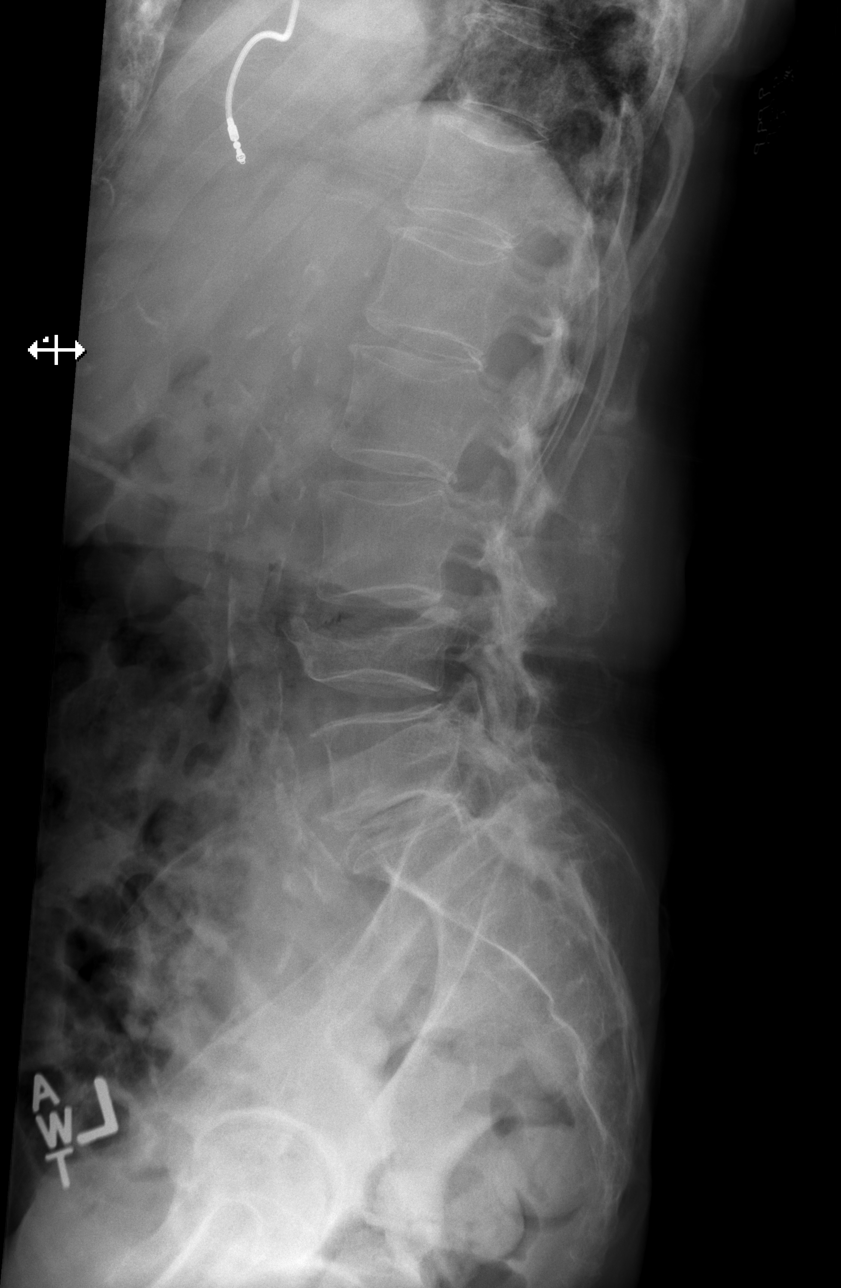

[t lumbar l-5 s-1 spot]
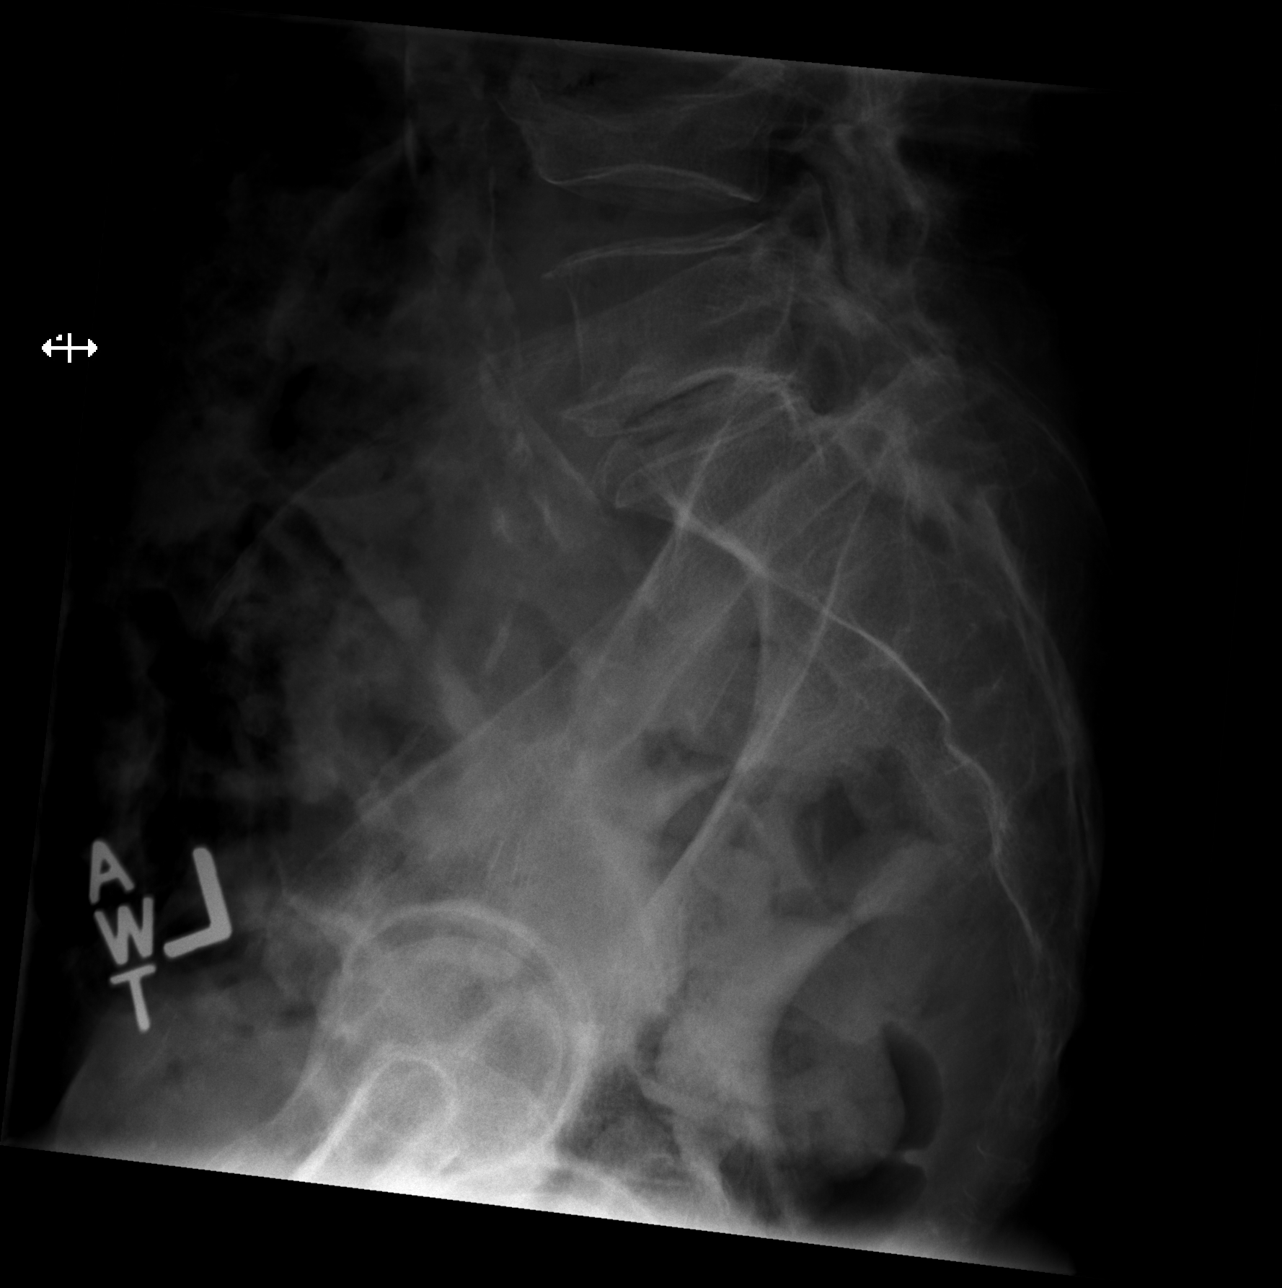

[5 of 5 positions shown; findings below may reference images not displayed]

FINDINGS: Bones are osteopenic. Diffuse degenerative changes of the lumbar
spine as before. Chronic L4 compression fracture with vertebra plana
deformity. No definite new compression fracture, wedge-shaped
deformity or focal kyphosis. Aortoiliac atherosclerosis noted.
Overall stable exam.
IMPRESSION: Chronic L4 compression fracture deformity. Stable exam. No definite
acute osseous finding.

## 2016-02-01 ENCOUNTER — Emergency Department (HOSPITAL_COMMUNITY)

## 2016-02-01 ENCOUNTER — Inpatient Hospital Stay (HOSPITAL_COMMUNITY)
Admission: EM | Admit: 2016-02-01 | Discharge: 2016-02-06 | DRG: 065 | Disposition: A | Discharge status: Hospice | Attending: Internal Medicine | Admitting: Internal Medicine

## 2016-02-01 ENCOUNTER — Encounter (HOSPITAL_COMMUNITY): Payer: Self-pay | Admitting: Emergency Medicine

## 2016-02-01 DIAGNOSIS — N390 Urinary tract infection, site not specified: Secondary | ICD-10-CM | POA: Diagnosis present

## 2016-02-01 DIAGNOSIS — I63412 Cerebral infarction due to embolism of left middle cerebral artery: Principal | ICD-10-CM

## 2016-02-01 DIAGNOSIS — R4182 Altered mental status, unspecified: Secondary | ICD-10-CM

## 2016-02-01 DIAGNOSIS — Q670 Congenital facial asymmetry: Secondary | ICD-10-CM

## 2016-02-01 DIAGNOSIS — Z888 Allergy status to other drugs, medicaments and biological substances status: Secondary | ICD-10-CM

## 2016-02-01 DIAGNOSIS — Z66 Do not resuscitate: Secondary | ICD-10-CM | POA: Diagnosis present

## 2016-02-01 DIAGNOSIS — R531 Weakness: Secondary | ICD-10-CM

## 2016-02-01 DIAGNOSIS — IMO0002 Reserved for concepts with insufficient information to code with codable children: Secondary | ICD-10-CM

## 2016-02-01 DIAGNOSIS — F0390 Unspecified dementia without behavioral disturbance: Secondary | ICD-10-CM

## 2016-02-01 DIAGNOSIS — Z7189 Other specified counseling: Secondary | ICD-10-CM | POA: Insufficient documentation

## 2016-02-01 DIAGNOSIS — I422 Other hypertrophic cardiomyopathy: Secondary | ICD-10-CM | POA: Diagnosis present

## 2016-02-01 DIAGNOSIS — Z886 Allergy status to analgesic agent status: Secondary | ICD-10-CM

## 2016-02-01 DIAGNOSIS — Z880 Allergy status to penicillin: Secondary | ICD-10-CM

## 2016-02-01 DIAGNOSIS — E119 Type 2 diabetes mellitus without complications: Secondary | ICD-10-CM | POA: Diagnosis present

## 2016-02-01 DIAGNOSIS — Z90710 Acquired absence of both cervix and uterus: Secondary | ICD-10-CM

## 2016-02-01 DIAGNOSIS — R29810 Facial weakness: Secondary | ICD-10-CM

## 2016-02-01 DIAGNOSIS — Z79899 Other long term (current) drug therapy: Secondary | ICD-10-CM

## 2016-02-01 DIAGNOSIS — Z882 Allergy status to sulfonamides status: Secondary | ICD-10-CM

## 2016-02-01 DIAGNOSIS — Z8249 Family history of ischemic heart disease and other diseases of the circulatory system: Secondary | ICD-10-CM

## 2016-02-01 DIAGNOSIS — Z950 Presence of cardiac pacemaker: Secondary | ICD-10-CM

## 2016-02-01 DIAGNOSIS — I480 Paroxysmal atrial fibrillation: Secondary | ICD-10-CM | POA: Insufficient documentation

## 2016-02-01 DIAGNOSIS — I10 Essential (primary) hypertension: Secondary | ICD-10-CM | POA: Diagnosis present

## 2016-02-01 DIAGNOSIS — R4701 Aphasia: Secondary | ICD-10-CM | POA: Diagnosis present

## 2016-02-01 DIAGNOSIS — I509 Heart failure, unspecified: Secondary | ICD-10-CM | POA: Diagnosis present

## 2016-02-01 DIAGNOSIS — J449 Chronic obstructive pulmonary disease, unspecified: Secondary | ICD-10-CM | POA: Diagnosis present

## 2016-02-01 DIAGNOSIS — R410 Disorientation, unspecified: Secondary | ICD-10-CM | POA: Diagnosis not present

## 2016-02-01 DIAGNOSIS — I110 Hypertensive heart disease with heart failure: Secondary | ICD-10-CM | POA: Diagnosis present

## 2016-02-01 DIAGNOSIS — Z515 Encounter for palliative care: Secondary | ICD-10-CM | POA: Insufficient documentation

## 2016-02-01 DIAGNOSIS — R296 Repeated falls: Secondary | ICD-10-CM | POA: Diagnosis present

## 2016-02-01 DIAGNOSIS — G9340 Encephalopathy, unspecified: Secondary | ICD-10-CM

## 2016-02-01 DIAGNOSIS — Z9981 Dependence on supplemental oxygen: Secondary | ICD-10-CM

## 2016-02-01 DIAGNOSIS — Z7984 Long term (current) use of oral hypoglycemic drugs: Secondary | ICD-10-CM

## 2016-02-01 DIAGNOSIS — Z96651 Presence of right artificial knee joint: Secondary | ICD-10-CM | POA: Diagnosis present

## 2016-02-01 DIAGNOSIS — E785 Hyperlipidemia, unspecified: Secondary | ICD-10-CM | POA: Diagnosis present

## 2016-02-01 DIAGNOSIS — Z87891 Personal history of nicotine dependence: Secondary | ICD-10-CM

## 2016-02-01 DIAGNOSIS — M109 Gout, unspecified: Secondary | ICD-10-CM | POA: Diagnosis present

## 2016-02-01 LAB — I-STAT CHEM 8, ED
BUN: 27 mg/dL — ABNORMAL HIGH (ref 6–20)
CALCIUM ION: 1.14 mmol/L (ref 1.13–1.30)
CREATININE: 1.2 mg/dL — AB (ref 0.44–1.00)
Chloride: 99 mmol/L — ABNORMAL LOW (ref 101–111)
GLUCOSE: 141 mg/dL — AB (ref 65–99)
HEMATOCRIT: 38 % (ref 36.0–46.0)
HEMOGLOBIN: 12.9 g/dL (ref 12.0–15.0)
Potassium: 4.2 mmol/L (ref 3.5–5.1)
Sodium: 139 mmol/L (ref 135–145)
TCO2: 27 mmol/L (ref 0–100)

## 2016-02-01 LAB — DIFFERENTIAL
BASOS ABS: 0 10*3/uL (ref 0.0–0.1)
BASOS PCT: 0 %
Eosinophils Absolute: 0.2 10*3/uL (ref 0.0–0.7)
Eosinophils Relative: 4 %
LYMPHS PCT: 29 %
Lymphs Abs: 1.7 10*3/uL (ref 0.7–4.0)
MONO ABS: 0.4 10*3/uL (ref 0.1–1.0)
Monocytes Relative: 6 %
NEUTROS ABS: 3.6 10*3/uL (ref 1.7–7.7)
Neutrophils Relative %: 61 %

## 2016-02-01 LAB — COMPREHENSIVE METABOLIC PANEL
ALK PHOS: 60 U/L (ref 38–126)
ALT: 11 U/L — AB (ref 14–54)
AST: 25 U/L (ref 15–41)
Albumin: 3.6 g/dL (ref 3.5–5.0)
Anion gap: 8 (ref 5–15)
BUN: 25 mg/dL — AB (ref 6–20)
CALCIUM: 9.4 mg/dL (ref 8.9–10.3)
CHLORIDE: 102 mmol/L (ref 101–111)
CO2: 29 mmol/L (ref 22–32)
CREATININE: 1.22 mg/dL — AB (ref 0.44–1.00)
GFR calc non Af Amer: 42 mL/min — ABNORMAL LOW (ref >60)
GFR, EST AFRICAN AMERICAN: 48 mL/min — AB (ref >60)
GLUCOSE: 150 mg/dL — AB (ref 65–99)
Potassium: 4.3 mmol/L (ref 3.5–5.1)
SODIUM: 139 mmol/L (ref 135–145)
Total Bilirubin: 0.6 mg/dL (ref 0.3–1.2)
Total Protein: 6.5 g/dL (ref 6.5–8.1)

## 2016-02-01 LAB — CBC
HEMATOCRIT: 37.5 % (ref 36.0–46.0)
Hemoglobin: 12.2 g/dL (ref 12.0–15.0)
MCH: 32.4 pg (ref 26.0–34.0)
MCHC: 32.5 g/dL (ref 30.0–36.0)
MCV: 99.7 fL (ref 78.0–100.0)
PLATELETS: 182 10*3/uL (ref 150–400)
RBC: 3.76 MIL/uL — AB (ref 3.87–5.11)
RDW: 12.9 % (ref 11.5–15.5)
WBC: 5.9 10*3/uL (ref 4.0–10.5)

## 2016-02-01 LAB — PROTIME-INR
INR: 1.13 (ref 0.00–1.49)
Prothrombin Time: 14.7 seconds (ref 11.6–15.2)

## 2016-02-01 LAB — I-STAT TROPONIN, ED: Troponin i, poc: 0.05 ng/mL (ref 0.00–0.08)

## 2016-02-01 LAB — URINALYSIS, ROUTINE W REFLEX MICROSCOPIC
Bilirubin Urine: NEGATIVE
GLUCOSE, UA: NEGATIVE mg/dL
HGB URINE DIPSTICK: NEGATIVE
Ketones, ur: NEGATIVE mg/dL
Nitrite: NEGATIVE
PH: 7.5 (ref 5.0–8.0)
Protein, ur: NEGATIVE mg/dL
SPECIFIC GRAVITY, URINE: 1.009 (ref 1.005–1.030)

## 2016-02-01 LAB — URINE MICROSCOPIC-ADD ON

## 2016-02-01 LAB — APTT: APTT: 33 s (ref 24–37)

## 2016-02-01 LAB — ETHANOL: Alcohol, Ethyl (B): <5 mg/dL (ref <5)

## 2016-02-01 MED ORDER — TRAZODONE HCL 50 MG PO TABS: 25.0000 mg | ORAL_TABLET | Freq: Three times a day (TID) | ORAL | Status: DC

## 2016-02-01 MED ORDER — DIVALPROEX SODIUM 125 MG PO CSDR: 250.0000 mg | DELAYED_RELEASE_CAPSULE | Freq: Two times a day (BID) | ORAL | Status: DC

## 2016-02-01 MED ORDER — ACETAMINOPHEN 325 MG PO TABS: 650.0000 mg | ORAL_TABLET | Freq: Four times a day (QID) | ORAL | Status: DC | PRN

## 2016-02-01 MED ORDER — BENZOCAINE-MENTHOL 6-10 MG MT LOZG: 1.0000 | LOZENGE | OROMUCOSAL | Status: DC | PRN

## 2016-02-01 MED ORDER — CALCIUM CARBONATE ANTACID 750 MG PO CHEW: 1.0000 | CHEWABLE_TABLET | Freq: Four times a day (QID) | ORAL | Status: DC | PRN

## 2016-02-01 MED ORDER — MIRTAZAPINE 15 MG PO TBDP: 15.0000 mg | ORAL_TABLET | Freq: Every day | ORAL | Status: DC

## 2016-02-01 MED ORDER — ENOXAPARIN SODIUM 30 MG/0.3ML ~~LOC~~ SOLN
30.0000 mg | SUBCUTANEOUS | Status: DC
  Administered 2016-02-02 – 2016-02-06 (×5): 30 mg via SUBCUTANEOUS
  Filled 2016-02-01 (×5): qty 0.3

## 2016-02-01 MED ORDER — DOCUSATE SODIUM 100 MG PO CAPS: 100.0000 mg | ORAL_CAPSULE | Freq: Two times a day (BID) | ORAL | Status: DC

## 2016-02-01 MED ORDER — BUPROPION HCL ER (SR) 150 MG PO TB12: 150.0000 mg | ORAL_TABLET | Freq: Every day | ORAL | Status: DC

## 2016-02-01 MED ORDER — CEPHALEXIN 250 MG PO CAPS: 500.0000 mg | ORAL_CAPSULE | Freq: Two times a day (BID) | ORAL | Status: DC

## 2016-02-01 MED ORDER — LOSARTAN POTASSIUM 50 MG PO TABS: 50.0000 mg | ORAL_TABLET | Freq: Every day | ORAL | Status: DC

## 2016-02-01 MED ORDER — DEXTROMETHORPHAN-GUAIFENESIN 10-200 MG/5ML PO LIQD: 10.0000 mL | Freq: Four times a day (QID) | ORAL | Status: DC | PRN

## 2016-02-01 MED ORDER — AZO CRANBERRY 250-30 MG PO TABS: 1.0000 | ORAL_TABLET | Freq: Every day | ORAL | Status: DC

## 2016-02-01 MED ORDER — SENNA 8.6 MG PO TABS: 1.0000 | ORAL_TABLET | Freq: Two times a day (BID) | ORAL | Status: DC

## 2016-02-01 MED ORDER — OXYBUTYNIN CHLORIDE ER 15 MG PO TB24: 15.0000 mg | ORAL_TABLET | Freq: Every morning | ORAL | Status: DC

## 2016-02-01 MED ORDER — SODIUM CHLORIDE 0.9 % IV SOLN
INTRAVENOUS | Status: DC
  Administered 2016-02-02: 75 mL/h via INTRAVENOUS
  Administered 2016-02-03 – 2016-02-04 (×3): via INTRAVENOUS

## 2016-02-01 MED ORDER — PROCHLORPERAZINE MALEATE 10 MG PO TABS: 10.0000 mg | ORAL_TABLET | Freq: Four times a day (QID) | ORAL | Status: DC | PRN

## 2016-02-01 MED ORDER — POTASSIUM CHLORIDE CRYS ER 20 MEQ PO TBCR: 20.0000 meq | EXTENDED_RELEASE_TABLET | Freq: Every day | ORAL | Status: DC

## 2016-02-01 MED ORDER — DILTIAZEM HCL ER COATED BEADS 240 MG PO CP24: 240.0000 mg | ORAL_CAPSULE | Freq: Every day | ORAL | Status: DC

## 2016-02-01 MED ORDER — FLUTICASONE FUROATE-VILANTEROL 100-25 MCG/INH IN AEPB: 1.0000 | INHALATION_SPRAY | Freq: Every morning | RESPIRATORY_TRACT | Status: DC

## 2016-02-01 MED ORDER — METFORMIN HCL 500 MG PO TABS: 500.0000 mg | ORAL_TABLET | Freq: Every day | ORAL | Status: DC

## 2016-02-01 NOTE — ED Provider Notes (Signed)
CSN: [id number]     Arrival date & time 02/01/16  2121 History   First MD Initiated Contact with Patient 02/01/16 2138     Chief Complaint  Patient presents with  . Code Stroke     (Consider location/radiation/quality/duration/timing/severity/associated sxs/prior Treatment) The history is provided by the patient and the EMS personnel. The history is limited by the condition of the patient.  Patient, w hx dementia, arrives via EMS after was noted to appear generally weak, more confused than baseline, at ecf tonight.  Pt at baseline is conversant, confused, normal strength.  Ems noted ?mild right facial droop/facial asymmetry.  Pt is moving bilateral extremities purposefully w good strength.  Pt very poor historian, dementia - level 5 caveat.       Past Medical History  Diagnosis Date  . Hypertrophic cardiomyopathy (HCC)     a. 11/2013 Echo: EF 55-60%, basal inf HK, sev dil LA, no evidence of HCM.  PASP .  [name] Hypertension   . Situational mixed anxiety and depressive disorder   . Hypercholesterolemia   . Falls frequently     coumadin stopped  . Pulmonary HTN (HCC)     a. has had prior right heart cath in 2010; was felt that most likely due to elevated left sided pressures and would not benefit from vasodilator therapy;  b. 11/2013 Echo: PASP .  [name] Oxygen dependent     "3L 24/7" (08/05/2013)  . COPD (chronic obstructive pulmonary disease) (HCC)   . TIA (transient ischemic attack)   . Atrial fibrillation (HCC)     a. Chronic; No longer on coumadin 2/2 frequent falls.  . CHF (congestive heart failure) (HCC)   . Pacemaker     a. 04/2012 MDT [id number] [employer] PPM, ser #: [id number] H.  . Varicose veins   . History of pneumonia     "couple times; long time ago" (08/05/2013)  . Type II diabetes mellitus (HCC)   . Anemia     "as a child" (08/05/2013)  . GERD (gastroesophageal reflux disease)   . H/O hiatal hernia   . Osteoarthritis   . Arthritis     "all over me; in all my  joints" (08/05/2013)  . Coccyx pain   . Gout     "right foot" (08/05/2013)  . Anxiety   . Depression   . Chest pain     a. 08/2011 Myoview: sm, fixed anteroseptal defect (attenuation), no ischemia, EF 62%.  . Dementia   . Hyposmolality   . Hyponatremia    Past Surgical History  Procedure Laterality Date  . Total knee arthroplasty Right 2011  . Tonsillectomy    . Appendectomy    . Abdominal hysterectomy      "partial" (08/05/2013)  . Dilation and curettage of uterus      "several" (08/05/2013)  . Foot neuroma surgery Right   . Insert / replace / remove pacemaker  1998; 2000's; 2014  . Cardiac catheterization      "more than once" (08/05/2013)  . Back surgery      "bone spur removed; mid back" (08/05/2013)  . Pacemaker generator change N/A 04/24/2012    Procedure: PACEMAKER GENERATOR CHANGE;  Surgeon: Duke Salvia, MD;  Location: [facility] CATH LAB;  Service: Cardiovascular;  Laterality: N/A;   Family History  Problem Relation Age of Onset  . Other Father     died in plane crash  . Cancer Mother     lymphoma-NHL  . Coronary artery disease Grandchild   .  Heart disease Maternal Grandmother    Social History  Substance Use Topics  . Smoking status: Former Smoker -- 1.00 packs/day for 40 years    Types: Cigarettes    Quit date: 11/15/2005  . Smokeless tobacco: Never Used  . Alcohol Use: No   OB History    No data available       Review of Systems  Unable to perform ROS: Dementia  Constitutional: Negative for fever.  level 5 caveat, dementia    Allergies  Aspirin; Calan; Crestor; Escitalopram oxalate; Lipitor; Lopressor; Nitroglycerin; Norvasc; Nsaids; Oxycodone; Prednisone; Sulfa antibiotics; Vimovo; and Penicillins  Home Medications   Prior to Admission medications   Medication Sig Start Date End Date Taking? Authorizing Provider  acetaminophen (TYLENOL) 325 MG tablet Take 650 mg by mouth every 6 (six) hours as needed for mild pain or fever.     Historical Provider,  MD  benzocaine-menthol (CHLORASEPTIC) 6-10 MG lozenge Take 1 lozenge by mouth as needed for sore throat.    Historical Provider, MD  buPROPion (WELLBUTRIN XL) 150 MG 24 hr tablet Take 450 mg by mouth daily.     Historical Provider, MD  calcium carbonate (TUMS EX) 750 MG chewable tablet Chew 1 tablet by mouth every 6 (six) hours as needed for heartburn.    Historical Provider, MD  Cranberry-Vitamin C-Probiotic (AZO CRANBERRY) 250-30 MG TABS Take 1 tablet by mouth daily.    Historical Provider, MD  diltiazem (CARDIZEM CD) 240 MG 24 hr capsule Take 1 capsule (240 mg total) by mouth daily. 01/07/14   Cassell Clement, MD  docusate sodium (COLACE) 100 MG capsule Take 100 mg by mouth 2 (two) times daily.    Historical Provider, MD  Fluticasone Furoate-Vilanterol 100-25 MCG/INH AEPB Inhale 1 puff into the lungs every morning.    Historical Provider, MD  guaifenesin (ROBITUSSIN) 100 MG/5ML syrup Take 200 mg by mouth 4 (four) times daily as needed for cough.    Historical Provider, MD  HYDROcodone-acetaminophen (NORCO/VICODIN) 5-325 MG per tablet Take 2 tablets by mouth every 4 (four) hours as needed. Patient taking differently: Take 1 tablet by mouth every 4 (four) hours as needed.  08/29/14   Elson Areas, PA-C  losartan (COZAAR) 50 MG tablet TAKE 1 TABLET BY MOUTH EVERY DAY 07/25/14   Cassell Clement, MD  metFORMIN (GLUCOPHAGE) 500 MG tablet Take 500 mg by mouth daily with breakfast.     Historical Provider, MD  Multiple Vitamins-Iron (MULTIVITAMIN/IRON PO) Take 1 tablet by mouth daily.    Historical Provider, MD  oxybutynin (DITROPAN XL) 15 MG 24 hr tablet Take 15 mg by mouth every morning.     Historical Provider, MD  potassium chloride SA (K-DUR,KLOR-CON) 20 MEQ tablet Take 20 mEq by mouth daily. In the morning    Historical Provider, MD  prochlorperazine (COMPAZINE) 10 MG tablet Take 10 mg by mouth every 6 (six) hours as needed for nausea or vomiting.    Historical Provider, MD  senna (SENOKOT) 8.6  MG TABS tablet Take 1 tablet by mouth 2 (two) times daily.    Historical Provider, MD  traZODone (DESYREL) 50 MG tablet Take 25 mg by mouth 3 (three) times daily.     Historical Provider, MD   There were no vitals taken for this visit. Physical Exam  Constitutional:  Elderly, frail appearing.   HENT:  Head: Atraumatic.  Mouth/Throat: Oropharynx is clear and moist.  Eyes: Conjunctivae and EOM are normal. Pupils are equal, round, and reactive to light. No scleral  icterus.  Neck: Normal range of motion. Neck supple. No tracheal deviation present.  No stiffness or rigidity. No bruits.   Cardiovascular: Normal rate, regular rhythm, normal heart sounds and intact distal pulses.   No murmur heard. Pulmonary/Chest: Effort normal and breath sounds normal. No respiratory distress.  Abdominal: Soft. Normal appearance and bowel sounds are normal. She exhibits no distension. There is no tenderness.  Genitourinary:  No cva tenderness  Musculoskeletal: She exhibits no edema.  Neurological: She is alert.  Awake and alert. Is missing dentures, and speech is difficult to understand. Moves bil ext purposefully w good strength, although does not follow commands. ?mild facial asymmetry.   Skin: Skin is warm and dry. No rash noted.  Psychiatric: She has a normal mood and affect.  Nursing note and vitals reviewed.   ED Course  Procedures (including critical care time) Labs Review  Results for orders placed or performed during the hospital encounter of 02/01/16  MRSA PCR Screening  Result Value Ref Range   MRSA by PCR NEGATIVE NEGATIVE  Ethanol  Result Value Ref Range   Alcohol, Ethyl (B) <5 <5 mg/dL  Protime-INR  Result Value Ref Range   Prothrombin Time 14.7 11.6 - 15.2 seconds   INR 1.13 0.00 - 1.49  APTT  Result Value Ref Range   aPTT 33 24 - 37 seconds  CBC  Result Value Ref Range   WBC 5.9 4.0 - 10.5 K/uL   RBC 3.76 (L) 3.87 - 5.11 MIL/uL   Hemoglobin 12.2 12.0 - 15.0 g/dL   HCT 16.1  09.6 - 04.5 %   MCV 99.7 78.0 - 100.0 fL   MCH 32.4 26.0 - 34.0 pg   MCHC 32.5 30.0 - 36.0 g/dL   RDW 40.9 81.1 - 91.4 %   Platelets 182 150 - 400 K/uL  Differential  Result Value Ref Range   Neutrophils Relative % 61 %   Neutro Abs 3.6 1.7 - 7.7 K/uL   Lymphocytes Relative 29 %   Lymphs Abs 1.7 0.7 - 4.0 K/uL   Monocytes Relative 6 %   Monocytes Absolute 0.4 0.1 - 1.0 K/uL   Eosinophils Relative 4 %   Eosinophils Absolute 0.2 0.0 - 0.7 K/uL   Basophils Relative 0 %   Basophils Absolute 0.0 0.0 - 0.1 K/uL  Comprehensive metabolic panel  Result Value Ref Range   Sodium 139 135 - 145 mmol/L   Potassium 4.3 3.5 - 5.1 mmol/L   Chloride 102 101 - 111 mmol/L   CO2 29 22 - 32 mmol/L   Glucose, Bld 150 (H) 65 - 99 mg/dL   BUN 25 (H) 6 - 20 mg/dL   Creatinine, Ser 7.82 (H) 0.44 - 1.00 mg/dL   Calcium 9.4 8.9 - 95.6 mg/dL   Total Protein 6.5 6.5 - 8.1 g/dL   Albumin 3.6 3.5 - 5.0 g/dL   AST 25 15 - 41 U/L   ALT 11 (L) 14 - 54 U/L   Alkaline Phosphatase 60 38 - 126 U/L   Total Bilirubin 0.6 0.3 - 1.2 mg/dL   GFR calc non Af Amer 42 (L) >60 mL/min   GFR calc Af Amer 48 (L) >60 mL/min   Anion gap 8 5 - 15  Urine rapid drug screen (hosp performed)not at [facility]  Result Value Ref Range   Opiates NONE DETECTED NONE DETECTED   Cocaine NONE DETECTED NONE DETECTED   Benzodiazepines NONE DETECTED NONE DETECTED   Amphetamines NONE DETECTED NONE DETECTED   Tetrahydrocannabinol  NONE DETECTED NONE DETECTED   Barbiturates NONE DETECTED NONE DETECTED  Urinalysis, Routine w reflex microscopic (not at [facility])  Result Value Ref Range   Color, Urine YELLOW YELLOW   APPearance CLEAR CLEAR   Specific Gravity, Urine 1.009 1.005 - 1.030   pH 7.5 5.0 - 8.0   Glucose, UA NEGATIVE NEGATIVE mg/dL   Hgb urine dipstick NEGATIVE NEGATIVE   Bilirubin Urine NEGATIVE NEGATIVE   Ketones, ur NEGATIVE NEGATIVE mg/dL   Protein, ur NEGATIVE NEGATIVE mg/dL   Nitrite NEGATIVE NEGATIVE   Leukocytes, UA TRACE (A)  NEGATIVE  Urine microscopic-add on  Result Value Ref Range   Squamous Epithelial / LPF 0-5 (A) NONE SEEN   WBC, UA 0-5 0 - 5 WBC/hpf   RBC / HPF 0-5 0 - 5 RBC/hpf   Bacteria, UA RARE (A) NONE SEEN  Valproic acid level  Result Value Ref Range   Valproic Acid Lvl 28 (L) 50.0 - 100.0 ug/mL  Hemoglobin A1c  Result Value Ref Range   Hgb A1c MFr Bld 5.4 4.8 - 5.6 %   Mean Plasma Glucose 108 mg/dL  Glucose, capillary  Result Value Ref Range   Glucose-Capillary 101 (H) 65 - 99 mg/dL  Glucose, capillary  Result Value Ref Range   Glucose-Capillary 157 (H) 65 - 99 mg/dL  Glucose, capillary  Result Value Ref Range   Glucose-Capillary 97 65 - 99 mg/dL   Comment 1 Notify RN    Comment 2 Document in Chart   Glucose, capillary  Result Value Ref Range   Glucose-Capillary 108 (H) 65 - 99 mg/dL   Comment 1 Notify RN    Comment 2 Document in Chart   I-Stat Chem 8, ED  (not at [facility])  Result Value Ref Range   Sodium 139 135 - 145 mmol/L   Potassium 4.2 3.5 - 5.1 mmol/L   Chloride 99 (L) 101 - 111 mmol/L   BUN 27 (H) 6 - 20 mg/dL   Creatinine, Ser 1.61 (H) 0.44 - 1.00 mg/dL   Glucose, Bld 096 (H) 65 - 99 mg/dL   Calcium, Ion 0.45 4.09 - 1.30 mmol/L   TCO2 27 0 - 100 mmol/L   Hemoglobin 12.9 12.0 - 15.0 g/dL   HCT 81.1 91.4 - 78.2 %  I-stat troponin, ED (not at [facility], [facility])  Result Value Ref Range   Troponin i, poc 0.05 0.00 - 0.08 ng/mL   Comment 3           Ct Head Wo Contrast  02/03/2016  ADDENDUM REPORT: 02/03/2016 04:30 ADDENDUM: These results were called by telephone at the time of interpretation on 02/03/2016 at 4:29 am to nurse practitioner Burnadette Peter who verbally acknowledged these results. Electronically Signed   By: Elgie Collard M.D.   On: 02/03/2016 04:30  02/03/2016  CLINICAL DATA:  80 year old female with altered mental status EXAM: CT HEAD WITHOUT CONTRAST TECHNIQUE: Contiguous axial images were obtained from the base of the skull through the vertex without intravenous  contrast. COMPARISON:  CT dated 02/01/2016 FINDINGS: The ventricles are dilated and the sulci are prominent compatible with age-related atrophy. Periventricular and deep white matter hypodensities represent chronic microvascular ischemic changes. There is a wedge-shaped area of hypodensity in the left posterior parietal/ occipital lobe compatible with an infarct, possibly subacute. An acute infarct is not excluded. Clinical correlation is recommended. There is no intracranial hemorrhage. No mass effect or midline shift identified. The visualized paranasal sinuses and mastoid air cells are well aerated. The calvarium is  intact. IMPRESSION: No acute intracranial hemorrhage. Left posterior parietal/occipital wedge-shaped hypodensity compatible with an infarct, likely subacute or less likely acute. Correlation with clinical exam and further evaluation with MRI recommended. Age-related atrophy and chronic microvascular ischemic disease. Electronically Signed: By: Elgie CollardArash  Radparvar M.D. On: 02/03/2016 04:16   Ct Head Wo Contrast  02/01/2016  CLINICAL DATA:  Code stroke.  Altered mental status. EXAM: CT HEAD WITHOUT CONTRAST TECHNIQUE: Contiguous axial images were obtained from the base of the skull through the vertex without intravenous contrast. COMPARISON:  10/04/2015 FINDINGS: The study is mildly motion degraded throughout. There is no evidence of acute large territory infarct, intracranial hemorrhage, mass, midline shift, or extra-axial fluid collection. Moderate cerebral atrophy is unchanged. Cerebral white matter hypodensities are unchanged and nonspecific but compatible with moderate chronic small vessel ischemic disease. Small, chronic infarcts are again seen in the region of the left putamen and in the right cerebellum. Orbits are unremarkable. The paranasal sinuses and mastoid air cells are clear. Calcified atherosclerosis is noted at the skullbase. Gas in the infratemporal fossae and right cavernous sinus is  likely venous and may be related to recent venipuncture. IMPRESSION: 1. Motion degraded examination without acute intracranial abnormality identified. 2. Moderate chronic small vessel ischemic disease. These results were called by telephone at the time of interpretation on 02/01/2016 at 9:40 pm to Dr. Roseanne RenoStewart, who verbally acknowledged these results. Electronically Signed   By: [name] AcheAllen  Grady M.D.   On: 02/01/2016 21:42       I have personally reviewed and evaluated these images and lab results as part of my medical decision-making.   EKG Interpretation   Date/Time:  Thursday February 01 2016 21:44:13 EDT Ventricular Rate:  76 PR Interval:  222 QRS Duration: 108 QT Interval:  415 QTC Calculation: 467 R Axis:   81 Text Interpretation:  Sinus rhythm Prolonged PR interval LVH with  secondary repolarization abnormality Non-specific ST-t changes Confirmed  by Denton LankSTEINL  MD, Caryn BeeKEVIN ([id number]) on 02/01/2016 11:15:14 PM      MDM   Iv ns. Stat ct.  Neurology, Dr Roseanne RenoStewart, evaluated in ED, and indicates no TPA, states admit to med service for weakness/altered MS, possible cva workup.  Reviewed nursing notes and prior charts for additional history.   Recheck pt, no change in exam from initial. Remains alert, content, moving all 4 ext.   Medicine service consulted for admission.    Cathren LaineKevin Alyx Mcguirk, MD 02/03/16 [phone]

## 2016-02-01 NOTE — H&P (Signed)
[facility] History and Physical  [name] [name] ZOX:[id number] DOB: 1936/03/25 DOA: 02/01/2016  Referring physician: EDP PCP: No primary care provider on file.   Chief Complaint: Altered mental status   HPI: [name] [name] is a 80 y.o. female with h/o dementia, presents to ED from her NH with acute altered mental status.  She has generalized weakness, appears to be more confused and more sleepy than baseline.  Neuro was initially consulted for possible code stroke, but patient was Ercilia.  Patient appears to be delirious at this time, difficult to understand secondary to not having dentures in.  She does have a h/o recurrent UTIs and was being treated for a UTI currently at the NH with keflex.  Stroke or TIA was felt to be unlikely.  Review of Systems: Unable to perform secondary to AMS.  Past Medical History  Diagnosis Date  . Hypertrophic cardiomyopathy (HCC)     a. 11/2013 Echo: EF 55-60%, basal inf HK, sev dil LA, no evidence of HCM.  PASP .  [name] Hypertension   . Situational mixed anxiety and depressive disorder   . Hypercholesterolemia   . Falls frequently     coumadin stopped  . Pulmonary HTN (HCC)     a. has had prior right heart cath in 2010; was felt that most likely due to elevated left sided pressures and would not benefit from vasodilator therapy;  b. 11/2013 Echo: PASP .  [name] Oxygen dependent     "3L 24/7" (08/05/2013)  . COPD (chronic obstructive pulmonary disease) (HCC)   . TIA (transient ischemic attack)   . Atrial fibrillation (HCC)     a. Chronic; No longer on coumadin 2/2 frequent falls.  . CHF (congestive heart failure) (HCC)   . Pacemaker     a. 04/2012 MDT [id number] [employer] PPM, ser #: [id number] H.  . Varicose veins   . History of pneumonia     "couple times; long time ago" (08/05/2013)  . Type II diabetes mellitus (HCC)   . Anemia     "as a child" (08/05/2013)  . GERD (gastroesophageal reflux disease)   . H/O hiatal hernia   . Osteoarthritis   . Arthritis      "all over me; in all my joints" (08/05/2013)  . Coccyx pain   . Gout     "right foot" (08/05/2013)  . Anxiety   . Depression   . Chest pain     a. 08/2011 Myoview: sm, fixed anteroseptal defect (attenuation), no ischemia, EF 62%.  . Dementia   . Hyposmolality   . Hyponatremia    Past Surgical History  Procedure Laterality Date  . Total knee arthroplasty Right 2011  . Tonsillectomy    . Appendectomy    . Abdominal hysterectomy      "partial" (08/05/2013)  . Dilation and curettage of uterus      "several" (08/05/2013)  . Foot neuroma surgery Right   . Insert / replace / remove pacemaker  1998; 2000's; 2014  . Cardiac catheterization      "more than once" (08/05/2013)  . Back surgery      "bone spur removed; mid back" (08/05/2013)  . Pacemaker generator change N/A 04/24/2012    Procedure: PACEMAKER GENERATOR CHANGE;  Surgeon: Duke Salvia, MD;  Location[facility] C[facility] LAB;  Service: Cardiovascular;  Laterality: N/A;   Social History:  reports that she quit smoking about 10 years ago. Her smoking use included Cigarettes. She has a 40 pack-year smoking history. She has  never used smokeless tobacco. She reports that she does not drink alcohol or use illicit drugs.  Allergies  Allergen Reactions  . Aspirin Nausea And Vomiting  . Calan [Verapamil Hcl] Other (See Comments)    Patient states it makes her "out of her mind"; bp bottoms out (takes diltiazem at home)  . Crestor [Rosuvastatin Calcium] Other (See Comments)    Muscle pain  . Escitalopram Oxalate Other (See Comments)    Reaction Unknown   . Lipitor [Atorvastatin Calcium] Other (See Comments)    myalgia  . Lopressor [Metoprolol Tartrate] Other (See Comments)    Blood pressure bottoms out   . Nitroglycerin Other (See Comments)    Reaction unknown  . Norvasc [Amlodipine Besylate] Other (See Comments)    fatigue  . Nsaids Nausea Only  . Oxycodone Other (See Comments)    Reaction unknown  . Prednisone Swelling  . Sulfa  Antibiotics Nausea Only and Other (See Comments)    Makes her stomach hurt  . Vimovo [Naproxen-Esomeprazole] Nausea Only  . Penicillins Hives and Rash    Family History  Problem Relation Age of Onset  . Other Father     died in plane crash  . Cancer Mother     lymphoma-NHL  . Coronary artery disease Grandchild   . Heart disease Maternal Grandmother      Prior to Admission medications   Medication Sig Start Date End Date Taking? Authorizing Provider  acetaminophen (TYLENOL) 325 MG tablet Take 650 mg by mouth every 6 (six) hours as needed for mild pain or fever.    Yes Historical Provider, MD  benzocaine-menthol (CHLORASEPTIC) 6-10 MG lozenge Take 1 lozenge by mouth every 2 (two) hours as needed for sore throat.    Yes Historical Provider, MD  buPROPion (WELLBUTRIN SR) 150 MG 12 hr tablet Take 150 mg by mouth daily.   Yes Historical Provider, MD  calcium carbonate (TUMS EX) 750 MG chewable tablet Chew 1 tablet by mouth every 6 (six) hours as needed for heartburn.   Yes Historical Provider, MD  cephALEXin (KEFLEX) 500 MG capsule Take 500 mg by mouth 2 (two) times daily.   Yes Historical Provider, MD  Cranberry-Vitamin C-Probiotic (AZO CRANBERRY) 250-30 MG TABS Take 1 tablet by mouth daily.   Yes Historical Provider, MD  Dextromethorphan-Guaifenesin (DIABETIC TUSSIN MAX ST) 10-200 MG/5ML LIQD Take 10 mLs by mouth every 6 (six) hours as needed (for cough).   Yes Historical Provider, MD  diltiazem (CARDIZEM CD) 240 MG 24 hr capsule Take 1 capsule (240 mg total) by mouth daily. 01/07/14  Yes Cassell Clementhomas Brackbill, MD  diphenhydrAMINE (BENADRYL) 25 MG tablet Take 25 mg by mouth every 6 (six) hours as needed for itching.   Yes Historical Provider, MD  divalproex (DEPAKOTE SPRINKLE) 125 MG capsule Take 250 mg by mouth 2 (two) times daily.   Yes Historical Provider, MD  docusate sodium (COLACE) 100 MG capsule Take 100 mg by mouth 2 (two) times daily.   Yes Historical Provider, MD  Fluticasone  Furoate-Vilanterol 100-25 MCG/INH AEPB Inhale 1 puff into the lungs every morning.   Yes Historical Provider, MD  HYDROcodone-acetaminophen (NORCO/VICODIN) 5-325 MG per tablet Take 2 tablets by mouth every 4 (four) hours as needed. Patient taking differently: Take 1 tablet by mouth every 4 (four) hours as needed.  08/29/14  Yes Lonia SkinnerLeslie K Sofia, PA-C  losartan (COZAAR) 50 MG tablet TAKE 1 TABLET BY MOUTH EVERY DAY 07/25/14  Yes Cassell Clementhomas Brackbill, MD  metFORMIN (GLUCOPHAGE) 500 MG tablet Take  500 mg by mouth daily with breakfast.    Yes Historical Provider, MD  mirtazapine (REMERON SOL-TAB) 15 MG disintegrating tablet Take 15 mg by mouth at bedtime.   Yes Historical Provider, MD  Multiple Vitamins-Iron (MULTIVITAMIN/IRON PO) Take 1 tablet by mouth daily.   Yes Historical Provider, MD  oxybutynin (DITROPAN XL) 15 MG 24 hr tablet Take 15 mg by mouth every morning.    Yes Historical Provider, MD  potassium chloride SA (K-DUR,KLOR-CON) 20 MEQ tablet Take 20 mEq by mouth daily. In the morning   Yes Historical Provider, MD  prochlorperazine (COMPAZINE) 10 MG tablet Take 10 mg by mouth every 6 (six) hours as needed for nausea or vomiting.   Yes Historical Provider, MD  senna (SENOKOT) 8.6 MG TABS tablet Take 1 tablet by mouth 2 (two) times daily.   Yes Historical Provider, MD  traZODone (DESYREL) 50 MG tablet Take 25 mg by mouth 3 (three) times daily.    Yes Historical Provider, MD   Physical Exam: Filed Vitals:   02/01/16 2245 02/01/16 2315  BP: 171/136 126/53  Pulse: 77 60  Temp:    Resp: 28 18    BP 126/53 mmHg  Pulse 60  Temp(Src) 96.9 F (36.1 C) (Rectal)  Resp 18  SpO2 95%  General Appearance:    Sleepy but wakes up to voice, confused, not following commands, , no distress, appears stated age  Head:    Normocephalic, atraumatic  Eyes:    PERRL, EOMI, sclera non-icteric        Nose:   Nares without drainage or epistaxis. Mucosa, turbinates normal  Throat:   Moist mucous membranes.  Oropharynx without erythema or exudate.  Neck:   Supple. No carotid bruits.  No thyromegaly.  No lymphadenopathy.   Back:     No CVA tenderness, no spinal tenderness  Lungs:     Clear to auscultation bilaterally, without wheezes, rhonchi or rales  Chest wall:    No tenderness to palpitation  Heart:    Regular rate and rhythm without murmurs, gallops, rubs  Abdomen:     Soft, non-tender, nondistended, normal bowel sounds, no organomegaly  Genitalia:    deferred  Rectal:    deferred  Extremities:   No clubbing, cyanosis or edema.  Pulses:   2+ and symmetric all extremities  Skin:   Skin color, texture, turgor normal, no rashes or lesions  Lymph nodes:   Cervical, supraclavicular, and axillary nodes normal  Neurologic:   Zamya, grossly non-focal but not participating in exam    Labs on Admission:  Basic Metabolic Panel:  Recent Labs Lab 02/01/16 2126 02/01/16 2137  NA 139 139  K 4.3 4.2  CL 102 99*  CO2 29  --   GLUCOSE 150* 141*  BUN 25* 27*  CREATININE 1.22* 1.20*  CALCIUM 9.4  --    Liver Function Tests:  Recent Labs Lab 02/01/16 2126  AST 25  ALT 11*  ALKPHOS 60  BILITOT 0.6  PROT 6.5  ALBUMIN 3.6   No results for input(s): LIPASE, AMYLASE in the last 168 hours. No results for input(s): AMMONIA in the last 168 hours. CBC:  Recent Labs Lab 02/01/16 2126 02/01/16 2137  WBC 5.9  --   NEUTROABS 3.6  --   HGB 12.2 12.9  HCT 37.5 38.0  MCV 99.7  --   PLT 182  --    Cardiac Enzymes: No results for input(s): CKTOTAL, CKMB, CKMBINDEX, TROPONINI in the last 168 hours.  BNP (last  3 results) No results for input(s): PROBNP in the last 8760 hours. CBG: No results for input(s): GLUCAP in the last 168 hours.  Radiological Exams on Admission: Ct Head Wo Contrast  02/01/2016  CLINICAL DATA:  Code stroke.  Altered mental status. EXAM: CT HEAD WITHOUT CONTRAST TECHNIQUE: Contiguous axial images were obtained from the base of the skull through the vertex without  intravenous contrast. COMPARISON:  10/04/2015 FINDINGS: The study is mildly motion degraded throughout. There is no evidence of acute large territory infarct, intracranial hemorrhage, mass, midline shift, or extra-axial fluid collection. Moderate cerebral atrophy is unchanged. Cerebral white matter hypodensities are unchanged and nonspecific but compatible with moderate chronic small vessel ischemic disease. Small, chronic infarcts are again seen in the region of the left putamen and in the right cerebellum. Orbits are unremarkable. The paranasal sinuses and mastoid air cells are clear. Calcified atherosclerosis is noted at the skullbase. Gas in the infratemporal fossae and right cavernous sinus is likely venous and may be related to recent venipuncture. IMPRESSION: 1. Motion degraded examination without acute intracranial abnormality identified. 2. Moderate chronic small vessel ischemic disease. These results were called by telephone at the time of interpretation on 02/01/2016 at 9:40 pm to Dr. Roseanne Reno, who verbally acknowledged these results. Electronically Signed   By: [name] Ache M.D.   On: 02/01/2016 21:42    EKG: Independently reviewed.  Assessment/Plan Principal Problem:   Altered mental status Active Problems:   Essential hypertension   Facial asymmetry   1. AMS - Delirium, Stroke felt to be less likely, UA doesn't show UTI today. 1. Delirium, have some suspicion of patient being on too many sedating medications since she apparently had a very similar episode at [facility] back in December which was due to patient taking both ativan and norco at the same time.  See the EDP note from December 22nd in the chart. 2. Will admit for observation 3. Cannot obtain MRI due to presence of pacemaker 4. EEG for AM ordered 5. NPO for the moment, have held home meds, would resume these ASAP when she becomes more alert and able to take POs 6. IVF to keep her hydrated 2. HTN - holding home meds 1. Use short  acting meds if needed 3. H/o A.Fib 1. Tele monitor 2. Use cardizem gtt if patient goes into RVR, currently she is paced at rate of 60 so will hold off on ordering this for now.    Code Status: DNR per yellow form  Family Communication: No family in room, daughter was providing history earlier Disposition Plan: Admit to obs   Time spent: 70 min  Osmar Howton M. [facility] Pager [phone]  If 7AM-7PM, please contact the day team taking care of the patient [email or web address] Password [email or web address] 02/01/2016, 11:48 PM

## 2016-02-01 NOTE — ED Notes (Signed)
Unable to perform accurate stroke swallow test. Pt with dementia is unable to follow commands at this time.

## 2016-02-01 NOTE — ED Notes (Signed)
Per EMS, patient coming from SNF with altered mental status. Pt with dementia history and being treated for UTI at this time. Patient with slurred speech, but does not have regular dentures in. Pt moving all extremities, but unable to answer questions and follow commands.

## 2016-02-01 NOTE — Consult Note (Addendum)
Admission H&P    Chief Complaint: Altered mental status.  HPI: [name] [name] is an 80 y.o. female with a history of atrial fibrillation, TIA, COPD, CHF, pacemaker placement, diabetes mellitus, and dementia brought to the emergency room and code stroke status following an apparent change in mental status. Patient appeared to be somewhat less responsive and more confused and the baseline. No focal weakness was noted. CT scan of her head showed no acute intracranial abnormality. Patient has a history of recurrent urinary tract infections and is currently on antibiotic for UTI. Temperature was slightly low at 96.9. WBC count was normal. BUN and creatinine was slightly elevated. Urinalysis results pending. Patient was not deemed a candidate for TPA. Acute stroke or TIA was felt to be unlikely and code stroke was canceled.  Past Medical History  Diagnosis Date  . Hypertrophic cardiomyopathy ([place])     a. 11/2013 Echo: EF 55-60%, basal inf HK, sev dil LA, no evidence of HCM.  PASP 89mHg.  .Marland KitchenHypertension   . Situational mixed anxiety and depressive disorder   . Hypercholesterolemia   . Falls frequently     coumadin stopped  . Pulmonary HTN ([place]     a. has had prior right heart cath in 2010; was felt that most likely due to elevated left sided pressures and would not benefit from vasodilator therapy;  b. 11/2013 Echo: PASP 626mg.  . Marland Kitchenxygen dependent     "3L 24/7" (08/05/2013)  . COPD (chronic obstructive pulmonary disease) ([place]  . TIA (transient ischemic attack)   . Atrial fibrillation ([place]    a. Chronic; No longer on coumadin 2/2 frequent falls.  . CHF (congestive heart failure) ([place]  . Pacemaker     a. 04/2012 MDT [id number] DamePM, ser #: [id number].  . Varicose veins   . History of pneumonia     "couple times; long time ago" (08/05/2013)  . Type II diabetes mellitus ([place]  . Anemia     "as a child" (08/05/2013)  . GERD (gastroesophageal reflux disease)   . H/O hiatal hernia   .  Osteoarthritis   . Arthritis     "all over me; in all my joints" (08/05/2013)  . Coccyx pain   . Gout     "right foot" (08/05/2013)  . Anxiety   . Depression   . Chest pain     a. 08/2011 Myoview: sm, fixed anteroseptal defect (attenuation), no ischemia, EF 62%.  . Dementia   . Hyposmolality   . Hyponatremia     Past Surgical History  Procedure Laterality Date  . Total knee arthroplasty Right 2011  . Tonsillectomy    . Appendectomy    . Abdominal hysterectomy      "partial" (08/05/2013)  . Dilation and curettage of uterus      "several" (08/05/2013)  . Foot neuroma surgery Right   . Insert / replace / remove pacemaker  1998; 2000's; 2014  . Cardiac catheterization      "more than once" (08/05/2013)  . Back surgery      "bone spur removed; mid back" (08/05/2013)  . Pacemaker generator change N/A 04/24/2012    Procedure: PACEMAKER GENERATOR CHANGE;  Surgeon: StDeboraha SprangMD;  [facility];  Service: Cardiovascular;  Laterality: N/A;    Family History  Problem Relation Age of Onset  . Other Father     died in plane crash  . Cancer Mother     lymphoma-NHL  .  Coronary artery disease Grandchild   . Heart disease Maternal Grandmother    Social History:  reports that she quit smoking about 10 years ago. Her smoking use included Cigarettes. She has a 40 pack-year smoking history. She has never used smokeless tobacco. She reports that she does not drink alcohol or use illicit drugs.  Allergies:  Allergies  Allergen Reactions  . Aspirin Nausea And Vomiting  . Calan [Verapamil Hcl] Other (See Comments)    Patient states it makes her "out of her mind"; bp bottoms out (takes diltiazem at home)  . Crestor [Rosuvastatin Calcium] Other (See Comments)    Muscle pain  . Escitalopram Oxalate Other (See Comments)    Reaction Unknown   . Lipitor [Atorvastatin Calcium] Other (See Comments)    myalgia  . Lopressor [Metoprolol Tartrate] Other (See Comments)    Blood pressure  bottoms out   . Nitroglycerin Other (See Comments)    Reaction unknown  . Norvasc [Amlodipine Besylate] Other (See Comments)    fatigue  . Nsaids Nausea Only  . Oxycodone Other (See Comments)    Reaction unknown  . Prednisone Swelling  . Sulfa Antibiotics Nausea Only and Other (See Comments)    Makes her stomach hurt  . Vimovo [Naproxen-Esomeprazole] Nausea Only  . Penicillins Hives and Rash    Medications: Patient's preadmission medications were reviewed by me.  ROS: Unavailable due to patient's confusional state.  Physical Examination: Blood pressure 126/53, pulse 60, temperature 96.9 F (36.1 C), temperature source Rectal, resp. rate 18, SpO2 95 %.  HEENT-  Normocephalic, no lesions, without obvious abnormality.  Normal external eye and conjunctiva.  Normal TM's bilaterally.  Normal auditory canals and external ears. Normal external nose, mucus membranes and septum.  Normal pharynx. Neck supple with no masses, nodes, nodules or enlargement. Cardiovascular - regular rate and rhythm, S1, S2 normal, no murmur, click, rub or gallop Lungs - chest clear, no wheezing, rales, normal symmetric air entry Abdomen - soft, non-tender; bowel sounds normal; no masses,  no organomegaly Extremities - no joint deformities, effusion, or inflammation  Neurologic Examination: Patient was slightly lethargic and markedly confused, including inability to follow simple commands. Speech output was unintelligible for the most part. She was in no acute distress. Pupils were equal and reacted normally to light. Extraocular movements were full and conjugate on right and left lateral gaze. Face was symmetrical except for slight asymmetry of mouth, suggestive of possible mild right lower facial weakness. Speech was moderately dysarthric and unintelligible. Patient moved extremities equally with normal and symmetrical strength throughout. Deep tendon reflexes were trace to 1+ and symmetrical. Plantar  responses were mute.  Results for orders placed or performed during the hospital encounter of 02/01/16 (from the past 48 hour(s))  Ethanol     Status: None   Collection Time: 02/01/16  9:26 PM  Result Value Ref Range   Alcohol, Ethyl (B) <5 <5 mg/dL    Comment:        LOWEST DETECTABLE LIMIT FOR SERUM ALCOHOL IS 5 mg/dL FOR MEDICAL PURPOSES ONLY   Protime-INR     Status: None   Collection Time: 02/01/16  9:26 PM  Result Value Ref Range   Prothrombin Time 14.7 11.6 - 15.2 seconds   INR 1.13 0.00 - 1.49  APTT     Status: None   Collection Time: 02/01/16  9:26 PM  Result Value Ref Range   aPTT 33 24 - 37 seconds  CBC     Status: Abnormal  Collection Time: 02/01/16  9:26 PM  Result Value Ref Range   WBC 5.9 4.0 - 10.5 K/uL   RBC 3.76 (L) 3.87 - 5.11 MIL/uL   Hemoglobin 12.2 12.0 - 15.0 g/dL   HCT 37.5 36.0 - 46.0 %   MCV 99.7 78.0 - 100.0 fL   MCH 32.4 26.0 - 34.0 pg   MCHC 32.5 30.0 - 36.0 g/dL   RDW 12.9 11.5 - 15.5 %   Platelets 182 150 - 400 K/uL  Differential     Status: None   Collection Time: 02/01/16  9:26 PM  Result Value Ref Range   Neutrophils Relative % 61 %   Neutro Abs 3.6 1.7 - 7.7 K/uL   Lymphocytes Relative 29 %   Lymphs Abs 1.7 0.7 - 4.0 K/uL   Monocytes Relative 6 %   Monocytes Absolute 0.4 0.1 - 1.0 K/uL   Eosinophils Relative 4 %   Eosinophils Absolute 0.2 0.0 - 0.7 K/uL   Basophils Relative 0 %   Basophils Absolute 0.0 0.0 - 0.1 K/uL  Comprehensive metabolic panel     Status: Abnormal   Collection Time: 02/01/16  9:26 PM  Result Value Ref Range   Sodium 139 135 - 145 mmol/L   Potassium 4.3 3.5 - 5.1 mmol/L   Chloride 102 101 - 111 mmol/L   CO2 29 22 - 32 mmol/L   Glucose, Bld 150 (H) 65 - 99 mg/dL   BUN 25 (H) 6 - 20 mg/dL   Creatinine, Ser 1.22 (H) 0.44 - 1.00 mg/dL   Calcium 9.4 8.9 - 10.3 mg/dL   Total Protein 6.5 6.5 - 8.1 g/dL   Albumin 3.6 3.5 - 5.0 g/dL   AST 25 15 - 41 U/L   ALT 11 (L) 14 - 54 U/L   Alkaline Phosphatase 60 38 -  126 U/L   Total Bilirubin 0.6 0.3 - 1.2 mg/dL   GFR calc non Af Amer 42 (L) >60 mL/min   GFR calc Af Amer 48 (L) >60 mL/min    Comment: (NOTE) The eGFR has been calculated using the CKD EPI equation. This calculation has not been validated in all clinical situations. eGFR's persistently <60 mL/min signify possible Chronic Kidney Disease.    Anion gap 8 5 - 15  I-stat troponin, ED (not at [facility])     Status: None   Collection Time: 02/01/16  9:35 PM  Result Value Ref Range   Troponin i, poc 0.05 0.00 - 0.08 ng/mL   Comment 3            Comment: Due to the release kinetics of cTnI, a negative result within the first hours of the onset of symptoms does not rule out myocardial infarction with certainty. If myocardial infarction is still suspected, repeat the test at appropriate intervals.   I-Stat Chem 8, ED  (not at [facility], [facility])     Status: Abnormal   Collection Time: 02/01/16  9:37 PM  Result Value Ref Range   Sodium 139 135 - 145 mmol/L   Potassium 4.2 3.5 - 5.1 mmol/L   Chloride 99 (L) 101 - 111 mmol/L   BUN 27 (H) 6 - 20 mg/dL   Creatinine, Ser 1.20 (H) 0.44 - 1.00 mg/dL   Glucose, Bld 141 (H) 65 - 99 mg/dL   Calcium, Ion 1.14 1.13 - 1.30 mmol/L   TCO2 27 0 - 100 mmol/L   Hemoglobin 12.9 12.0 - 15.0 g/dL   HCT 38.0 36.0 - 46.0 %  Ct Head Wo Contrast  02/01/2016  CLINICAL DATA:  Code stroke.  Altered mental status. EXAM: CT HEAD WITHOUT CONTRAST TECHNIQUE: Contiguous axial images were obtained from the base of the skull through the vertex without intravenous contrast. COMPARISON:  10/04/2015 FINDINGS: The study is mildly motion degraded throughout. There is no evidence of acute large territory infarct, intracranial hemorrhage, mass, midline shift, or extra-axial fluid collection. Moderate cerebral atrophy is unchanged. Cerebral white matter hypodensities are unchanged and nonspecific but compatible with moderate chronic small vessel ischemic disease. Small, chronic infarcts  are again seen in the region of the left putamen and in the right cerebellum. Orbits are unremarkable. The paranasal sinuses and mastoid air cells are clear. Calcified atherosclerosis is noted at the skullbase. Gas in the infratemporal fossae and right cavernous sinus is likely venous and may be related to recent venipuncture. IMPRESSION: 1. Motion degraded examination without acute intracranial abnormality identified. 2. Moderate chronic small vessel ischemic disease. These results were called by telephone at the time of interpretation on 02/01/2016 at 9:40 pm to Dr. Nicole Kindred, who verbally acknowledged these results. Electronically Signed   By: Logan Bores M.D.   On: 02/01/2016 21:42    Assessment/Plan 80 year old lady presenting with altered mental status of unclear etiology, but most likely multifactorial, including underlying dementia of moderate to marked severity, double mild dehydration, and possible urinary tract infection.  Recommendations: 1. MRI of the brain cannot be obtained because patient has an implanted pacemaker. Recommend repeat CT scan of the head without contrast in 48-72 hours rule out possible acute stroke. 2. EEG, routine adult study 3. Antibiotic management per primary admitting team  We will continue to follow this patient with you.  C.R. Nicole Kindred, MD [facility]  02/01/2016, 11:24 PM

## 2016-02-02 ENCOUNTER — Observation Stay (HOSPITAL_BASED_OUTPATIENT_CLINIC_OR_DEPARTMENT_OTHER)
Admit: 2016-02-02 | Discharge: 2016-02-02 | Disposition: A | Discharge status: Home / Self Care | Attending: Internal Medicine | Admitting: Internal Medicine

## 2016-02-02 DIAGNOSIS — I10 Essential (primary) hypertension: Secondary | ICD-10-CM | POA: Diagnosis not present

## 2016-02-02 DIAGNOSIS — R4182 Altered mental status, unspecified: Secondary | ICD-10-CM

## 2016-02-02 DIAGNOSIS — R400 Somnolence: Secondary | ICD-10-CM | POA: Diagnosis not present

## 2016-02-02 DIAGNOSIS — F0390 Unspecified dementia without behavioral disturbance: Secondary | ICD-10-CM | POA: Insufficient documentation

## 2016-02-02 LAB — GLUCOSE, CAPILLARY
GLUCOSE-CAPILLARY: 101 mg/dL — AB (ref 65–99)
GLUCOSE-CAPILLARY: 157 mg/dL — AB (ref 65–99)
Glucose-Capillary: 97 mg/dL (ref 65–99)

## 2016-02-02 LAB — RAPID URINE DRUG SCREEN, HOSP PERFORMED
AMPHETAMINES: NOT DETECTED
BENZODIAZEPINES: NOT DETECTED
Barbiturates: NOT DETECTED
COCAINE: NOT DETECTED
OPIATES: NOT DETECTED
Tetrahydrocannabinol: NOT DETECTED

## 2016-02-02 LAB — VALPROIC ACID LEVEL: VALPROIC ACID LVL: 28 ug/mL — AB (ref 50.0–100.0)

## 2016-02-02 LAB — MRSA PCR SCREENING: MRSA BY PCR: NEGATIVE

## 2016-02-02 MED ORDER — OXYBUTYNIN CHLORIDE ER 15 MG PO TB24
15.0000 mg | ORAL_TABLET | Freq: Every morning | ORAL | Status: DC
  Administered 2016-02-03 – 2016-02-06 (×4): 15 mg via ORAL
  Filled 2016-02-02 (×9): qty 1

## 2016-02-02 MED ORDER — DEXTROMETHORPHAN-GUAIFENESIN 10-200 MG/5ML PO LIQD: 10.0000 mL | Freq: Four times a day (QID) | ORAL | Status: DC | PRN

## 2016-02-02 MED ORDER — SENNA 8.6 MG PO TABS
1.0000 | ORAL_TABLET | Freq: Two times a day (BID) | ORAL | Status: DC
  Administered 2016-02-02 – 2016-02-06 (×6): 8.6 mg via ORAL
  Filled 2016-02-02 (×8): qty 1

## 2016-02-02 MED ORDER — FLUTICASONE FUROATE-VILANTEROL 100-25 MCG/INH IN AEPB
1.0000 | INHALATION_SPRAY | Freq: Every morning | RESPIRATORY_TRACT | Status: DC
  Administered 2016-02-02 – 2016-02-06 (×4): 1 via RESPIRATORY_TRACT
  Filled 2016-02-02: qty 28

## 2016-02-02 MED ORDER — TRAZODONE HCL 50 MG PO TABS
25.0000 mg | ORAL_TABLET | Freq: Three times a day (TID) | ORAL | Status: DC
  Administered 2016-02-02 – 2016-02-06 (×12): 25 mg via ORAL
  Filled 2016-02-02 (×12): qty 1

## 2016-02-02 MED ORDER — LOSARTAN POTASSIUM 50 MG PO TABS
50.0000 mg | ORAL_TABLET | Freq: Every day | ORAL | Status: DC
  Administered 2016-02-03 – 2016-02-04 (×2): 50 mg via ORAL
  Filled 2016-02-02 (×2): qty 1

## 2016-02-02 MED ORDER — INSULIN ASPART 100 UNIT/ML ~~LOC~~ SOLN
0.0000 [IU] | Freq: Three times a day (TID) | SUBCUTANEOUS | Status: DC
  Administered 2016-02-02: 2 [IU] via SUBCUTANEOUS
  Administered 2016-02-03: 1 [IU] via SUBCUTANEOUS

## 2016-02-02 MED ORDER — METFORMIN HCL 500 MG PO TABS: 500.0000 mg | ORAL_TABLET | Freq: Every day | ORAL | Status: DC

## 2016-02-02 MED ORDER — PROCHLORPERAZINE MALEATE 10 MG PO TABS
10.0000 mg | ORAL_TABLET | Freq: Four times a day (QID) | ORAL | Status: DC | PRN
  Filled 2016-02-02: qty 1

## 2016-02-02 MED ORDER — DIVALPROEX SODIUM 125 MG PO CSDR
250.0000 mg | DELAYED_RELEASE_CAPSULE | Freq: Two times a day (BID) | ORAL | Status: DC
  Administered 2016-02-02 – 2016-02-06 (×7): 250 mg via ORAL
  Filled 2016-02-02 (×8): qty 2

## 2016-02-02 MED ORDER — POTASSIUM CHLORIDE CRYS ER 20 MEQ PO TBCR
20.0000 meq | EXTENDED_RELEASE_TABLET | Freq: Every day | ORAL | Status: DC
  Administered 2016-02-03 – 2016-02-06 (×4): 20 meq via ORAL
  Filled 2016-02-02 (×4): qty 1

## 2016-02-02 MED ORDER — DEXTROMETHORPHAN-GUAIFENESIN 10-100 MG/5ML PO SYRP
5.0000 mL | ORAL_SOLUTION | Freq: Four times a day (QID) | ORAL | Status: DC | PRN
  Administered 2016-02-03: 5 mL via ORAL
  Filled 2016-02-02 (×2): qty 5

## 2016-02-02 MED ORDER — MIRTAZAPINE 15 MG PO TBDP
15.0000 mg | ORAL_TABLET | Freq: Every day | ORAL | Status: DC
  Administered 2016-02-02 – 2016-02-05 (×3): 15 mg via ORAL
  Filled 2016-02-02 (×5): qty 1

## 2016-02-02 MED ORDER — DOCUSATE SODIUM 100 MG PO CAPS
100.0000 mg | ORAL_CAPSULE | Freq: Two times a day (BID) | ORAL | Status: DC
  Administered 2016-02-03 – 2016-02-06 (×4): 100 mg via ORAL
  Filled 2016-02-02 (×7): qty 1

## 2016-02-02 MED ORDER — DILTIAZEM HCL ER COATED BEADS 240 MG PO CP24
240.0000 mg | ORAL_CAPSULE | Freq: Every day | ORAL | Status: DC
  Administered 2016-02-03 – 2016-02-06 (×3): 240 mg via ORAL
  Filled 2016-02-02 (×4): qty 1

## 2016-02-02 NOTE — Progress Notes (Signed)
CM received a call from Renea EeKara Marshall, liaison for [facility] [place], stating that patient is under their [facility].  Ms Elsie StainMarshall's contact number is [phone].  Elmer Balesourtney Jailani Hogans RN, MSN [phone]

## 2016-02-02 NOTE — Progress Notes (Signed)
[facility] PROGRESS NOTE  [name] [name] EAV:[id number] DOB: 12/23/1935 DOA: 02/01/2016 PCP: No primary care provider on file.  Length of stay:    Assessment/Plan: Principal Problem:   Altered mental status Active Problems:   Essential hypertension   Facial asymmetry   Dementia   Brief summary 80 y.o. female with a history of atrial fibrillation, TIA, COPD, CHF, pacemaker placement, diabetes mellitus, and dementia brought to the emergency room and code stroke status following an apparent change in mental status. Patient appeared to be somewhat less responsive and more confused and the baseline. No focal weakness was noted. CT scan of her head showed no acute intracranial abnormality. Patient has a history of recurrent urinary tract infections and is currently on antibiotic for UTI. Temperature was slightly low at 96.9. WBC count was normal. BUN and creatinine was slightly elevated. Urinalysis negative. Patient was not deemed a candidate for TPA. Acute stroke or TIA was felt to be unlikely and code stroke was canceled   Assessment and plan Altered mental status 1. Delirium, have some suspicion of patient being on too many sedating medications since she apparently had a very [facility] back [facility] which was due to patient taking both ativan and norco at the same time. See the EDP note from December 22nd in the chart. 2. Unable to obtain MRI due to pacemaker, repeat CT scan in 48-72 hours to rule out acute stroke 3. EEG does not show any abnormality 4. NPO pending speech therapy evaluation 5. IVF to keep her hydrated   Hypertension resume outpatient medications, diabetes mellitus, check hemoglobin A1c, continue to hold Glucophage, sliding scale insulin  COPD-stable continue outpatient regimen   Atrial fibrillation with pacemaker Currently not on anticoagulation, resume diltiazem    DVT prophylaxsis Lovenox   Code Status:    DO NOT RESUSCITATE   Code  Status Orders       Family Communication: Discussed in detail with the patient, all imaging results, lab results explained to the patient   Disposition Plan:  Repeat CT scan tomorrow    Consultants:  Neurology    Procedures:  None  Antibiotics: Anti-infectives    Start     Dose/Rate Route Frequency Ordered Stop   02/02/16 0000  cephALEXin (KEFLEX) capsule 500 mg  Status:  Discontinued     500 mg Oral 2 times daily 02/01/16 2346 02/01/16 2353         HPI/Subjective: Confused,unable to communicate   Objective: Filed Vitals:   02/02/16 0015 02/02/16 0030 02/02/16 0205 02/02/16 0533  BP: 168/56 133/62 134/57 166/73  Pulse: 61 62 68 61  Temp:   97.8 F (36.6 C) 98.6 F (37 C)  TempSrc:   Axillary Axillary  Resp: 17 8 16 18   SpO2: 98% 95% 95% 90%    Intake/Output Summary (Last 24 hours) at 02/02/16 1216 Last data filed at 02/01/16 2320  Gross per 24 hour  Intake      0 ml  Output    225 ml  Net   -225 ml    Exam:  General: No acute respiratory distress Lungs: Clear to auscultation bilaterally without wheezes or crackles Cardiovascular: Regular rate and rhythm without murmur gallop or rub normal S1 and S2 Abdomen: Nontender, nondistended, soft, bowel sounds positive, no rebound, no ascites, no appreciable mass Extremities: No significant cyanosis, clubbing, or edema bilateral lower extremities     Data Review   Micro Results Recent Results (from the  past 240 hour(s))  MRSA PCR Screening     Status: None   Collection Time: 02/02/16  2:50 AM  Result Value Ref Range Status   MRSA by PCR NEGATIVE NEGATIVE Final    Comment:        The GeneXpert MRSA Assay (FDA approved for NASAL specimens only), is one component of a comprehensive MRSA colonization surveillance program. It is not intended to diagnose MRSA infection nor to guide or monitor treatment for MRSA infections.     Radiology Reports Ct Head Wo Contrast  02/01/2016  CLINICAL DATA:   Code stroke.  Altered mental status. EXAM: CT HEAD WITHOUT CONTRAST TECHNIQUE: Contiguous axial images were obtained from the base of the skull through the vertex without intravenous contrast. COMPARISON:  10/04/2015 FINDINGS: The study is mildly motion degraded throughout. There is no evidence of acute large territory infarct, intracranial hemorrhage, mass, midline shift, or extra-axial fluid collection. Moderate cerebral atrophy is unchanged. Cerebral white matter hypodensities are unchanged and nonspecific but compatible with moderate chronic small vessel ischemic disease. Small, chronic infarcts are again seen in the region of the left putamen and in the right cerebellum. Orbits are unremarkable. The paranasal sinuses and mastoid air cells are clear. Calcified atherosclerosis is noted at the skullbase. Gas in the infratemporal fossae and right cavernous sinus is likely venous and may be related to recent venipuncture. IMPRESSION: 1. Motion degraded examination without acute intracranial abnormality identified. 2. Moderate chronic small vessel ischemic disease. These results were called by telephone at the time of interpretation on 02/01/2016 at 9:40 pm to Dr. Roseanne Reno, who verbally acknowledged these results. Electronically Signed   By: [name] Ache M.D.   On: 02/01/2016 21:42     CBC  Recent Labs Lab 02/01/16 2126 02/01/16 2137  WBC 5.9  --   HGB 12.2 12.9  HCT 37.5 38.0  PLT 182  --   MCV 99.7  --   MCH 32.4  --   MCHC 32.5  --   RDW 12.9  --   LYMPHSABS 1.7  --   MONOABS 0.4  --   EOSABS 0.2  --   BASOSABS 0.0  --     Chemistries   Recent Labs Lab 02/01/16 2126 02/01/16 2137  NA 139 139  K 4.3 4.2  CL 102 99*  CO2 29  --   GLUCOSE 150* 141*  BUN 25* 27*  CREATININE 1.22* 1.20*  CALCIUM 9.4  --   AST 25  --   ALT 11*  --   ALKPHOS 60  --   BILITOT 0.6  --     ------------------------------------------------------------------------------------------------------------------ CrCl cannot be calculated (Unknown ideal weight.). ------------------------------------------------------------------------------------------------------------------ No results for input(s): HGBA1C in the last 72 hours. ------------------------------------------------------------------------------------------------------------------ No results for input(s): CHOL, HDL, LDLCALC, TRIG, CHOLHDL, LDLDIRECT in the last 72 hours. ------------------------------------------------------------------------------------------------------------------ No results for input(s): TSH, T4TOTAL, T3FREE, THYROIDAB in the last 72 hours.  Invalid input(s): FREET3 ------------------------------------------------------------------------------------------------------------------ No results for input(s): VITAMINB12, FOLATE, FERRITIN, TIBC, IRON, RETICCTPCT in the last 72 hours.  Coagulation profile  Recent Labs Lab 02/01/16 2126  INR 1.13    No results for input(s): DDIMER in the last 72 hours.  Cardiac Enzymes No results for input(s): CKMB, TROPONINI, MYOGLOBIN in the last 168 hours.  Invalid input(s): CK ------------------------------------------------------------------------------------------------------------------ Invalid input(s): POCBNP   CBG: No results for input(s): GLUCAP in the last 168 hours.     Studies: Ct Head Wo Contrast  02/01/2016  CLINICAL DATA:  Code stroke.  Altered mental status. EXAM:  CT HEAD WITHOUT CONTRAST TECHNIQUE: Contiguous axial images were obtained from the base of the skull through the vertex without intravenous contrast. COMPARISON:  10/04/2015 FINDINGS: The study is mildly motion degraded throughout. There is no evidence of acute large territory infarct, intracranial hemorrhage, mass, midline shift, or extra-axial fluid collection. Moderate cerebral  atrophy is unchanged. Cerebral white matter hypodensities are unchanged and nonspecific but compatible with moderate chronic small vessel ischemic disease. Small, chronic infarcts are again seen in the region of the left putamen and in the right cerebellum. Orbits are unremarkable. The paranasal sinuses and mastoid air cells are clear. Calcified atherosclerosis is noted at the skullbase. Gas in the infratemporal fossae and right cavernous sinus is likely venous and may be related to recent venipuncture. IMPRESSION: 1. Motion degraded examination without acute intracranial abnormality identified. 2. Moderate chronic small vessel ischemic disease. These results were called by telephone at the time of interpretation on 02/01/2016 at 9:40 pm to Dr. Roseanne Reno, who verbally acknowledged these results. Electronically Signed   By: [name] Ache M.D.   On: 02/01/2016 21:42      Lab Results  Component Value Date   HGBA1C 5.6 05/29/2014   HGBA1C 6.4* 11/18/2013   HGBA1C 6.4* 08/05/2013   Lab Results  Component Value Date   LDLCALC 78 08/05/2013   CREATININE 1.20* 02/01/2016       Scheduled Meds: . enoxaparin (LOVENOX) injection  30 mg Subcutaneous Q24H   Continuous Infusions: . sodium chloride 75 mL/hr (02/02/16 0010)    Principal Problem:   Altered mental status Active Problems:   Essential hypertension   Facial asymmetry   Dementia    Time spent: 45 minutes   [facility]  [facility] Pager [phone]. If 7PM-7AM, please contact night-coverage at [email or web address], password [facility] 02/02/2016, 12:16 PM

## 2016-02-02 NOTE — Progress Notes (Signed)
Initial Nutrition Assessment   INTERVENTION:  Provide Mighty Shake once daily, provides 500 kcal and 20 grams of protein Provide Magic Cup ice cream once daily, provides 290 kcal and 9 grams of protein   NUTRITION DIAGNOSIS:   Predicted suboptimal nutrient intake related to chronic illness, lethargy/confusion as evidenced by percent weight loss.   GOAL:   Patient will meet greater than or equal to 90% of their needs   MONITOR:   PO intake, Supplement acceptance, Labs, Weight trends, Skin, I & O's  REASON FOR ASSESSMENT:   Low Braden    ASSESSMENT:   80 y.o. female with a history of atrial fibrillation, TIA, COPD, CHF, pacemaker placement, diabetes mellitus, and dementia brought to the emergency room and code stroke status following an apparent change in mental status.   Pt asleep at time of visit. Pt is confused and unable to communicate per MD note. Pt assessed by SLP this afternoon; limitations in trial of solids due to reports of nausea per SLP note. Pt was advanced to a regular diet this afternoon. Per SLP note, pt's daughter reported that pt was eating whatever she wanted PTA. Pt has a history of weight loss, no new weight in chart. Pt familiar to nutrition team from previous admissions. Ensure causes diarrhea per past pt report.    Labs reviewed.   Diet Order:  Diet regular Room service appropriate?: Yes; Fluid consistency:: Thin  Skin:  Reviewed, no issues  Last BM:  3/24  Height:   Ht Readings from Last 1 Encounters:  02/02/16 5\' 4"  (1.626 m)    Weight:   Wt Readings from Last 1 Encounters:  11/02/15 103 lb (46.72 kg)    Ideal Body Weight:  54.5 kg  BMI:  There is no weight on file to calculate BMI.  Estimated Nutritional Needs:   Kcal:  1380-1640  Protein:  65-75 grams  Fluid:  1.4 L/day  EDUCATION NEEDS:   No education needs identified at this time  Dorothea Ogleeanne Vegas Coffin RD, LDN Inpatient Clinical Dietitian Pager: [phone] After Hours Pager:  [phone]

## 2016-02-02 NOTE — Care Management Obs Status (Signed)
MEDICARE OBSERVATION STATUS NOTIFICATION   Patient Details  Name: [name] [name] MRN: [id number] Date of Birth: 03/20/1936   Medicare Observation Status Notification Given:  Yes (Negative head CT, negative urinalysis.)    Anda KraftRobarge, Florencia Zaccaro C, RN 02/02/2016, 2:30 PM

## 2016-02-02 NOTE — Evaluation (Signed)
Clinical/Bedside Swallow Evaluation Patient Details  Name: [name] [name] MRN: [id number] Date of Birth: 04-26-1936  Today's Date: 02/02/2016 Time: SLP Start Time (ACUTE ONLY): 1405 SLP Stop Time (ACUTE ONLY): 1425 SLP Time Calculation (min) (ACUTE ONLY): 20 min  Past Medical History:  Past Medical History  Diagnosis Date  . Hypertrophic cardiomyopathy (HCC)     a. 11/2013 Echo: EF 55-60%, basal inf HK, sev dil LA, no evidence of HCM.  PASP .  [name] Hypertension   . Situational mixed anxiety and depressive disorder   . Hypercholesterolemia   . Falls frequently     coumadin stopped  . Pulmonary HTN (HCC)     a. has had prior right heart cath in 2010; was felt that most likely due to elevated left sided pressures and would not benefit from vasodilator therapy;  b. 11/2013 Echo: PASP .  [name] Oxygen dependent     "3L 24/7" (08/05/2013)  . COPD (chronic obstructive pulmonary disease) (HCC)   . TIA (transient ischemic attack)   . Atrial fibrillation (HCC)     a. Chronic; No longer on coumadin 2/2 frequent falls.  . CHF (congestive heart failure) (HCC)   . Pacemaker     a. 04/2012 MDT [id number] [employer] PPM, ser #: [id number] H.  . Varicose veins   . History of pneumonia     "couple times; long time ago" (08/05/2013)  . Type II diabetes mellitus (HCC)   . Anemia     "as a child" (08/05/2013)  . GERD (gastroesophageal reflux disease)   . H/O hiatal hernia   . Osteoarthritis   . Arthritis     "all over me; in all my joints" (08/05/2013)  . Coccyx pain   . Gout     "right foot" (08/05/2013)  . Anxiety   . Depression   . Chest pain     a. 08/2011 Myoview: sm, fixed anteroseptal defect (attenuation), no ischemia, EF 62%.  . Dementia   . Hyposmolality   . Hyponatremia    Past Surgical History:  Past Surgical History  Procedure Laterality Date  . Total knee arthroplasty Right 2011  . Tonsillectomy    . Appendectomy    . Abdominal hysterectomy      "partial" (08/05/2013)  . Dilation and  curettage of uterus      "several" (08/05/2013)  . Foot neuroma surgery Right   . Insert / replace / remove pacemaker  1998; 2000's; 2014  . Cardiac catheterization      "more than once" (08/05/2013)  . Back surgery      "bone spur removed; mid back" (08/05/2013)  . Pacemaker generator change N/A 04/24/2012    Procedure: PACEMAKER GENERATOR CHANGE;  Surgeon: Duke Salvia, MD;  Location: [facility] CATH LAB;  Service: Cardiovascular;  Laterality: N/A;   HPI:  80 y.o. female with a history of atrial fibrillation, TIA, COPD, CHF, pacemaker placement, diabetes mellitus, and dementia brought to the emergency room and code stroke status following an apparent change in mental status. Patient appeared to be somewhat less responsive and more confused than baseline. No focal weakness was noted. CT scan of her head showed no acute intracranial abnormality. Patient has a history of recurrent urinary tract infections and is currently on antibiotic for UTI. Temperature was slightly low at 96.9. WBC count was normal. BUN and creatinine was slightly elevated. Urinalysis negative. Patient was not deemed a candidate for TPA. Acute stroke or TIA was felt to be unlikely and code stroke was canceled.  Assessment / Plan / Recommendation Clinical Impression  Patient's swallowing function appears essentially WFL. Patient demonstrates decreased labial seal with trials via cup and spoon but suspect due to baseline cognitive impairments and was a dependent feeder throughout trials despite Max A multimodal cues. Patient consumed sequential sips of thin liquids via straw without overt s/s of aspiration and demonstrated efficient mastication of solid textures with minimal oral residue. Patient with minimal trials of solids due to reports of nausea. RN made aware. Spoke with patient's daughter over the phone who reported patient was eating and drinking "whatever she wanted" prior to admission. Recommend patient initiate a diet of regular  textures and thin liquids. Will f/u briefly for tolerance.     Aspiration Risk  Mild aspiration risk    Diet Recommendation Regular;Thin liquid   Liquid Administration via: Straw Medication Administration: Whole meds with liquid (May need to be administered crushed in puree due to cognitive impairments ) Supervision: Full supervision/cueing for compensatory strategies;Staff to assist with self feeding Compensations: Minimize environmental distractions;Slow rate;Small sips/bites Postural Changes: Seated upright at 90 degrees    Other  Recommendations Oral Care Recommendations: Oral care BID   Follow up Recommendations  Skilled Nursing facility    Frequency and Duration min 2x/week  1 week       Prognosis Prognosis for Safe Diet Advancement: Good Barriers to Reach Goals: Cognitive deficits      Swallow Study   General Date of Onset: 02/02/16 HPI: 80 y.o. female with a history of atrial fibrillation, TIA, COPD, CHF, pacemaker placement, diabetes mellitus, and dementia brought to the emergency room and code stroke status following an apparent change in mental status. Patient appeared to be somewhat less responsive and more confused than baseline. No focal weakness was noted. CT scan of her head showed no acute intracranial abnormality. Patient has a history of recurrent urinary tract infections and is currently on antibiotic for UTI. Temperature was slightly low at 96.9. WBC count was normal. BUN and creatinine was slightly elevated. Urinalysis negative. Patient was not deemed a candidate for TPA. Acute stroke or TIA was felt to be unlikely and code stroke was canceled.  Type of Study: Bedside Swallow Evaluation Previous Swallow Assessment: N/A  Diet Prior to this Study: NPO Temperature Spikes Noted: No Respiratory Status: Room air History of Recent Intubation: No Behavior/Cognition: Alert;Confused;Requires cueing Oral Cavity Assessment: Within Functional Limits Oral Care Completed  by SLP: No Oral Cavity - Dentition: Dentures, top;Dentures, bottom Vision: Functional for self-feeding Self-Feeding Abilities: Total assist Patient Positioning: Upright in chair Baseline Vocal Quality: Normal Volitional Cough: Cognitively unable to elicit Volitional Swallow: Able to elicit    Oral/Motor/Sensory Function Overall Oral Motor/Sensory Function: Within functional limits (difficulty following commands but grossly WFL)   Ice Chips Ice chips: Within functional limits   Thin Liquid Thin Liquid: Impaired Presentation: Cup;Spoon;Straw Oral Phase Impairments: Reduced labial seal Other Comments: Patient with decreased labial seal with cup but was able to use straw approrpriately     Nectar Thick Nectar Thick Liquid: Not tested   Honey Thick Honey Thick Liquid: Not tested   Puree Puree: Impaired Presentation: Spoon Oral Phase Impairments: Reduced labial seal (around spoon )   Solid   GO   Solid: Within functional limits    Functional Limitations: Swallowing Swallow Current Status (W0981): At least 1 percent but less than 20 percent impaired, limited or restricted Swallow Goal Status [phone]): At least 1 percent but less than 20 percent impaired, limited or restricted   Imelda Dandridge,  Ladena Jacquez 02/02/2016,2:36 PM   Feliberto Gottronourtney Cyleigh Massaro, MA, CCC-SLP [phone]

## 2016-02-02 NOTE — Progress Notes (Signed)
EEG Completed; Results Pending  

## 2016-02-02 NOTE — Progress Notes (Signed)
Current hospice patient followed at [facility] unit for COPD. Please call for any needs with discharge plan.  Dimas Aguas, RN Clinical Nurse Liaison [facility] [phone]     TRANSFER REPORT\      Date: 02/01/16     Date of Transfer:  ?????    Transferred From:  [facility] Patient Name:  [name] [name] DOB: 01/22/1936????? Emergency Contact:  Judy Pimple Phone Number: [phone]  Physician: Lavell Anchors MD Fax:  [phone] Advance Directives/EOL Decisions:  ????? Code Status:  DNR  Patient is receiving the following Hospice Services:  '[x]'$  Skilled Nursing '[x]'$  Social Work  '[]'$  Chaplain        '[x]'$  [place]-  Frequency:  10 x wk  '[]'$  Volunteer  '[]'$  Physical Therapy '[]'$  Occupational Therapy     '[]'$  Speech Therapy Facility Transferred to:      '[]'$ [facility] '[]'$ [facility]  '[]'$ [facility]        '[]'$ [facility]   '[]'$  [facility] [place]    '[]'$  [facility]      '[]'$   [place] '[x]'$ [facility] '[]'$ [facility]      '[]'$ [facility]     '[]'$ [facility]             '[]'$ SNF         '[]'$   Hospice Home  '[]'$   Other:?????  Report given to:    '[x]'$  Phone Call '[x]'$  Agency Liaison Personnel '[]'$  Fax  '[x]'$  Email Name:  Flo Shanks- Dimas Aguas Date:  02/02/16  Reason for Transfer: '[]'$ Medication side effects, toxicity, anaphylaxis '[]'$ Injury caused by fall  '[]'$ Respiratory Infection  '[]'$ Other respiratory problem   '[]'$ Heart Failure   '[]'$ Cardiac dysrhythmia '[]'$ Myocardial infarction or Chest Pain  '[]'$ Other heart disease  '[]'$ Stroke (CVA) or TIA  '[]'$ Hypo/Hyperglycemia, diabetes out of control '[]'$ GI bleeding/obstruction  '[]'$ Dehydration, Malnutrition '[]'$ Urinary tract infection    '[]'$ Uncontrolled Pain  '[]'$ Wound infection or deterioration  '[]'$ IV catheter-related infection or complication   '[]'$ DVT, pulmonary embolus   '[]'$ Acute Mental/behavioral health problems  '[x]'$ Other:  Facial droop, weakness, and altered mental status   Diagnosis related to the transfer:  Altered mental status?????   Significant health history:hypertenion, A.  fib, cardiac pacemaker   Current Status (Brief narrative related to the transfer)/Transfer orders and instructions:   ?????  Pain and Symptom Management Needs: Patient has chronic back pain and anxiety   Recommendations for follow up care (include needs that can not be met in the home): ?????   Reporting Clinician: ?????   '[x]'$  Report provided to Hospice Team  '[]'$  Copy of Plan of Care Provided (for transfers to other Hospice agency)

## 2016-02-02 NOTE — Procedures (Signed)
ELECTROENCEPHALOGRAM REPORT  Date of Study: 02/02/2016  Patient's Name: [name] [name] MRN: [id number] Date of Birth: [birth year]  Referring Provider: Dr. Lyda PeroneJared Gardner  Clinical History: This is a 80 year old woman with altered mental status.  Medications: 0.9 % sodium chloride infusion enoxaparin (LOVENOX) injection 30 mg  Technical Summary: A multichannel digital EEG recording measured by the international 10-20 system with electrodes applied with paste and impedances below 5000 ohms performed as portable with EKG monitoring in an awake and confused patient.  Hyperventilation and photic stimulation were not performed.  The digital EEG was referentially recorded, reformatted, and digitally filtered in a variety of bipolar and referential montages for optimal display.   Description: The patient is awake and confused during the recording.  During maximal wakefulness, there is a poorly sustained symmetric, medium voltage 8 Hz posterior dominant rhythm that poorly attenuates with eye opening and eye closure This is admixed with a small amount of diffuse 4-5 Hz theta and 2-3 Hz delta slowing of the waking background, with additional focal 2-3 Hz delta slowing over the left temporal region. Deeper stages of sleep were not seen. Hyperventilation and photic stimulation were not performed.  There were no epileptiform discharges or electrographic seizures seen.    EKG lead was unremarkable.  Impression: This awake EEG is abnormal due to the presence of: 1. Mild diffuse slowing of the waking background 2. Additional focal slowing over the left temporal region  Clinical Correlation of the above findings indicates bilateral cerebral dysfunction that is non-specific in etiology and can be seen with hypoxic/ischemic injury, toxic/metabolic encephalopathies, neurodegenerative disorders, or medication effect. Additional focal slowing over the left temporal region indicates focal cerebral dysfunction in this  region suggestive of underlying structural or physiologic abnormality.  The absence of epileptiform discharges does not rule out a clinical diagnosis of epilepsy.  Clinical correlation is advised.   Patrcia DollyKaren Jachai Okazaki, M.D.

## 2016-02-02 NOTE — Care Management Note (Signed)
Case Management Note  Patient Details  Name: [name] [name] MRN: [id number] Date of Birth: 03/10/1936  Subjective/Objective:                    Action/Plan: Patient presented with altered mental status. Resides at [facility].  Will follow for discharge needs pending PT/OT evals and physician orders.  Expected Discharge Date:                  Expected Discharge Plan:  Assisted Living / Rest Home  In-House Referral:  Clinical Social Work  Discharge planning Services     Post Acute Care Choice:    Choice offered to:     DME Arranged:    DME Agency:     HH Arranged:    HH Agency:     Status of Service:  In process, will continue to follow  Medicare Important Message Given:    Date Medicare IM Given:    Medicare IM give by:    Date Additional Medicare IM Given:    Additional Medicare Important Message give by:     If discussed at Long Length of Stay Meetings, dates discussed:    Additional Comments:  Anda KraftRobarge, Orvis Stann C, RN 02/02/2016, 10:39 AM [phone]

## 2016-02-03 ENCOUNTER — Encounter
Admission: RE | Admit: 2016-02-03 | Discharge: 2016-02-03 | Disposition: A | Discharge status: Home / Self Care | Payer: No Typology Code available for payment source | Source: Ambulatory Visit | Attending: Internal Medicine | Admitting: Internal Medicine

## 2016-02-03 ENCOUNTER — Observation Stay (HOSPITAL_COMMUNITY)

## 2016-02-03 DIAGNOSIS — E119 Type 2 diabetes mellitus without complications: Secondary | ICD-10-CM | POA: Insufficient documentation

## 2016-02-03 DIAGNOSIS — E785 Hyperlipidemia, unspecified: Secondary | ICD-10-CM | POA: Diagnosis present

## 2016-02-03 DIAGNOSIS — Z9981 Dependence on supplemental oxygen: Secondary | ICD-10-CM | POA: Diagnosis not present

## 2016-02-03 DIAGNOSIS — Z515 Encounter for palliative care: Secondary | ICD-10-CM | POA: Diagnosis not present

## 2016-02-03 DIAGNOSIS — Z8249 Family history of ischemic heart disease and other diseases of the circulatory system: Secondary | ICD-10-CM | POA: Diagnosis not present

## 2016-02-03 DIAGNOSIS — Z882 Allergy status to sulfonamides status: Secondary | ICD-10-CM | POA: Diagnosis not present

## 2016-02-03 DIAGNOSIS — I10 Essential (primary) hypertension: Secondary | ICD-10-CM | POA: Diagnosis not present

## 2016-02-03 DIAGNOSIS — I422 Other hypertrophic cardiomyopathy: Secondary | ICD-10-CM | POA: Diagnosis present

## 2016-02-03 DIAGNOSIS — I480 Paroxysmal atrial fibrillation: Secondary | ICD-10-CM | POA: Diagnosis present

## 2016-02-03 DIAGNOSIS — Z886 Allergy status to analgesic agent status: Secondary | ICD-10-CM | POA: Diagnosis not present

## 2016-02-03 DIAGNOSIS — F0390 Unspecified dementia without behavioral disturbance: Secondary | ICD-10-CM | POA: Diagnosis not present

## 2016-02-03 DIAGNOSIS — I63412 Cerebral infarction due to embolism of left middle cerebral artery: Secondary | ICD-10-CM | POA: Insufficient documentation

## 2016-02-03 DIAGNOSIS — R400 Somnolence: Secondary | ICD-10-CM | POA: Diagnosis not present

## 2016-02-03 DIAGNOSIS — M109 Gout, unspecified: Secondary | ICD-10-CM | POA: Diagnosis present

## 2016-02-03 DIAGNOSIS — R4701 Aphasia: Secondary | ICD-10-CM | POA: Diagnosis present

## 2016-02-03 DIAGNOSIS — R410 Disorientation, unspecified: Secondary | ICD-10-CM | POA: Diagnosis not present

## 2016-02-03 DIAGNOSIS — R296 Repeated falls: Secondary | ICD-10-CM | POA: Diagnosis present

## 2016-02-03 DIAGNOSIS — G459 Transient cerebral ischemic attack, unspecified: Secondary | ICD-10-CM | POA: Diagnosis not present

## 2016-02-03 DIAGNOSIS — Z880 Allergy status to penicillin: Secondary | ICD-10-CM | POA: Diagnosis not present

## 2016-02-03 DIAGNOSIS — Z87891 Personal history of nicotine dependence: Secondary | ICD-10-CM | POA: Diagnosis not present

## 2016-02-03 DIAGNOSIS — N390 Urinary tract infection, site not specified: Secondary | ICD-10-CM | POA: Diagnosis present

## 2016-02-03 DIAGNOSIS — Z888 Allergy status to other drugs, medicaments and biological substances status: Secondary | ICD-10-CM | POA: Diagnosis not present

## 2016-02-03 DIAGNOSIS — I509 Heart failure, unspecified: Secondary | ICD-10-CM | POA: Diagnosis present

## 2016-02-03 DIAGNOSIS — Z96651 Presence of right artificial knee joint: Secondary | ICD-10-CM | POA: Diagnosis present

## 2016-02-03 DIAGNOSIS — Z90710 Acquired absence of both cervix and uterus: Secondary | ICD-10-CM | POA: Diagnosis not present

## 2016-02-03 DIAGNOSIS — R4182 Altered mental status, unspecified: Secondary | ICD-10-CM | POA: Diagnosis not present

## 2016-02-03 DIAGNOSIS — Z7189 Other specified counseling: Secondary | ICD-10-CM | POA: Diagnosis not present

## 2016-02-03 DIAGNOSIS — J449 Chronic obstructive pulmonary disease, unspecified: Secondary | ICD-10-CM | POA: Diagnosis present

## 2016-02-03 DIAGNOSIS — Z7984 Long term (current) use of oral hypoglycemic drugs: Secondary | ICD-10-CM | POA: Diagnosis not present

## 2016-02-03 DIAGNOSIS — Z950 Presence of cardiac pacemaker: Secondary | ICD-10-CM | POA: Diagnosis not present

## 2016-02-03 DIAGNOSIS — I110 Hypertensive heart disease with heart failure: Secondary | ICD-10-CM | POA: Diagnosis present

## 2016-02-03 DIAGNOSIS — Z79899 Other long term (current) drug therapy: Secondary | ICD-10-CM | POA: Diagnosis not present

## 2016-02-03 DIAGNOSIS — Z66 Do not resuscitate: Secondary | ICD-10-CM | POA: Diagnosis present

## 2016-02-03 LAB — GLUCOSE, CAPILLARY
GLUCOSE-CAPILLARY: 110 mg/dL — AB (ref 65–99)
Glucose-Capillary: 108 mg/dL — ABNORMAL HIGH (ref 65–99)
Glucose-Capillary: 134 mg/dL — ABNORMAL HIGH (ref 65–99)
Glucose-Capillary: 86 mg/dL (ref 65–99)

## 2016-02-03 LAB — HEMOGLOBIN A1C
HEMOGLOBIN A1C: 5.4 % (ref 4.8–5.6)
Mean Plasma Glucose: 108 mg/dL

## 2016-02-03 MED ORDER — POLYVINYL ALCOHOL 1.4 % OP SOLN
1.0000 [drp] | OPHTHALMIC | Status: DC | PRN
  Administered 2016-02-03: 1 [drp] via OPHTHALMIC
  Filled 2016-02-03: qty 15

## 2016-02-03 MED ORDER — HYDROMORPHONE HCL 1 MG/ML IJ SOLN
1.0000 mg | INTRAMUSCULAR | Status: DC | PRN
  Administered 2016-02-03: 1 mg via INTRAVENOUS
  Filled 2016-02-03: qty 1

## 2016-02-03 MED ORDER — CLOPIDOGREL BISULFATE 75 MG PO TABS
75.0000 mg | ORAL_TABLET | Freq: Every day | ORAL | Status: DC
  Administered 2016-02-03 – 2016-02-06 (×4): 75 mg via ORAL
  Filled 2016-02-03 (×4): qty 1

## 2016-02-03 NOTE — Progress Notes (Signed)
SLP Cancellation Note  Patient Details Name: [name] [name] MRN: [id number] DOB: 07/16/1936   Cancelled treatment:       Reason Eval/Treat Not Completed: Patient at procedure or test/unavailable   Karson Reede,PAT, M.S., CCC-SLP 02/03/2016, 3:57 PM

## 2016-02-03 NOTE — Progress Notes (Signed)
STROKE TEAM PROGRESS NOTE   HISTORY OF PRESENT ILLNESS at admission [name] [name] is an 80 y.o. female with a history of atrial fibrillation, TIA, COPD, CHF, pacemaker placement, diabetes mellitus, and dementia brought to the emergency room and code stroke status following an apparent change in mental status. Patient appeared to be somewhat less responsive and more confused than the baseline. No focal weakness was noted. CT scan of her head showed no acute intracranial abnormality. Patient has a history of recurrent urinary tract infections and is currently on antibiotic for UTI. Temperature was slightly low at 96.9. WBC count was normal. BUN and creatinine was slightly elevated. Urinalysis results pending. Patient was not deemed a candidate for TPA. Acute stroke or TIA was felt to be unlikely and code stroke was canceled.   SUBJECTIVE (INTERVAL HISTORY) Her daughters were at the bedside.  Overall they feel her condition is gradually worsening. Patient has been confused and agitated throughout the day.  Patient given pain medication before my exam and now resting quietly.  Patient has aphasia so ROS was difficult to truly assess   OBJECTIVE Temp:  [97.5 F (36.4 C)-98.5 F (36.9 C)] 98.2 F (36.8 C) (03/25 0537) Pulse Rate:  [59-65] 65 (03/25 0537) Cardiac Rhythm:  [-] Ventricular paced (03/24 1955) Resp:  [18] 18 (03/25 0537) BP: (100-178)/(50-80) 178/60 mmHg (03/25 0537) SpO2:  [90 %-97 %] 93 % (03/25 0537)  CBC:  Recent Labs Lab 02/01/16 2126 02/01/16 2137  WBC 5.9  --   NEUTROABS 3.6  --   HGB 12.2 12.9  HCT 37.5 38.0  MCV 99.7  --   PLT 182  --     Basic Metabolic Panel:  Recent Labs Lab 02/01/16 2126 02/01/16 2137  NA 139 139  K 4.3 4.2  CL 102 99*  CO2 29  --   GLUCOSE 150* 141*  BUN 25* 27*  CREATININE 1.22* 1.20*  CALCIUM 9.4  --     Lipid Panel:    Component Value Date/Time   CHOL 154 08/05/2013 2018   TRIG 122 08/05/2013 2018   HDL 52 08/05/2013 2018    CHOLHDL 3.0 08/05/2013 2018   VLDL 24 08/05/2013 2018   LDLCALC 78 08/05/2013 2018   HgbA1c:  Lab Results  Component Value Date   HGBA1C 5.4 02/02/2016   Urine Drug Screen:    Component Value Date/Time   LABOPIA NONE DETECTED 02/01/2016 2325   LABOPIA POSITIVE 02/07/2015 2047   COCAINSCRNUR NONE DETECTED 02/01/2016 2325   LABBENZ NONE DETECTED 02/01/2016 2325   LABBENZ POSITIVE 02/07/2015 2047   AMPHETMU NONE DETECTED 02/01/2016 2325   AMPHETMU NEGATIVE 02/07/2015 2047   THCU NONE DETECTED 02/01/2016 2325   THCU NEGATIVE 02/07/2015 2047   LABBARB NONE DETECTED 02/01/2016 2325   LABBARB NEGATIVE 02/07/2015 2047      IMAGING  Ct Head Wo Contrast 02/03/2016   No acute intracranial hemorrhage. Left posterior parietal/occipital wedge-shaped hypodensity compatible with an infarct, likely subacute or less likely acute. Correlation with clinical exam and further evaluation with MRI recommended. Age-related atrophy and chronic microvascular ischemic disease.     Ct Head Wo Contrast 02/01/2016   1. Motion degraded examination without acute intracranial abnormality identified.  2. Moderate chronic small vessel ischemic disease.     EEG 02/02/2016 This awake EEG is abnormal due to the presence of: 1. Mild diffuse slowing of the waking background 2. Additional focal slowing over the left temporal region Clinical Correlation of the above findings indicates bilateral cerebral  dysfunction that is non-specific in etiology and can be seen with hypoxic/ischemic injury, toxic/metabolic encephalopathies, neurodegenerative disorders, or medication effect. Additional focal slowing over the left temporal region indicates focal cerebral dysfunction in this region suggestive of underlying structural or physiologic abnormality. The absence of epileptiform discharges does not rule out a clinical diagnosis of epilepsy. Clinical correlation is advised.    PHYSICAL EXAM Physical Exam General -  Well nourished, well developed, sleeping deeply Cardiovascular - Regular rate and rhythm Pulmonary: CTA Abdomen: NT, ND, normal bowel sounds Extremities: No C/C/E  Neurological Exam Mental Status: opens eyes but falls back to sleep easily Exam was limited.  I spent time discussing goals of care with the daughters who express a desire to focus on comfort and wanted her to rest.  Daughters have called the outpatient Hospice nurse and left a message.  They understand that the next few days will better define the impact of the stroke.  They do not want an NGT and patient was too agitated to consistently eat by mouth or take meds.  They are waiting for Palliative Care consult to better understand next steps in this context  ASSESSMENT/PLAN Ms. Kaula LYNE KHURANA is a 80 y.o. female with history of atrial fibrillation (Coumadin stopped secondary to frequent falls), TIA, COPD (Hospice), CHF, pacemaker placement, diabetes mellitus, and dementia presenting with altered mental status.. She did not receive IV t-PA due to low probability for stroke.  Stroke:  Dominant infarct probably embolic secondary to atrial fibrillation.  Resultant  Confusion and aphasia  MRI  permanent pacemaker  MRA  permanent pacemaker  CT - Left posterior parietal/occipital wedge-shaped hypodensity compatible with an infarct.  Carotid Doppler - pending  2D Echo pending  LDL - will order  EEG - see above  HgbA1c 5.4  VTE prophylaxis - Lovenox  Diet regular Room service appropriate?: Yes; Fluid consistency:: Thin  No antithrombotic prior to admission, now on clopidogrel 75 mg daily  Patient counseled to be compliant with her antithrombotic medications  Ongoing aggressive stroke risk factor management  Therapy recommendations: Pending  Disposition: Pending  Hypertension  Occasional low blood pressure otherwise stable  Permissive hypertension (OK if < 220/120) but gradually normalize in 5-7  days  Hyperlipidemia  Home meds:  No lipid lowering medications prior to admission  LDL pending goal < 70   Diabetes  HgbA1c 5.4, goal < 7.0  Controlled  Other Stroke Risk Factors  Advanced age  Cigarette smoker, quit smoking 10 years ago.  Obesity, There is no weight on file to calculate BMI.   Hx stroke/TIA  Other Active Problems  Renal insufficiency  Hospice patient for COPD  Hospital day #   ATTENDING NOTE: Patient was seen and examined by me personally. Documentation reflects findings. The laboratory and radiographic studies reviewed by me. ROS pertinent positives could not be fully documented due to LOC  Condition:  Agitated initially now sedated  Assessment and plan completed by me personally and fully documented above. Plans/Recommendations include:     Palliative consult pending  Family will observe how patient does to determine what they may want to do next.  They did day that the would not want carotid surgery if a lesion was found.  I cancelled carotid   SIGNED BY: Dr. Sula Soda      To contact Stroke Continuity provider, please refer to [email or web address]. After hours, contact General Neurology

## 2016-02-03 NOTE — Progress Notes (Signed)
[facility] PROGRESS NOTE  [name] [name] ZOX:[id number] DOB: 1936/11/03 DOA: 02/01/2016 PCP: No primary care provider on file.  Length of stay:    Assessment/Plan: Principal Problem:   Altered mental status Active Problems:   Essential hypertension   Facial asymmetry   Dementia   Altered mental state   Brief summary 80 y.o. female with a history of atrial fibrillation, TIA, COPD, CHF, pacemaker placement, diabetes mellitus, and dementia brought to the emergency room and code stroke status following an apparent change in mental status. Patient appeared to be somewhat less responsive and more confused and the baseline. No focal weakness was noted. CT scan of her head showed no acute intracranial abnormality. Patient has a history of recurrent urinary tract infections and is currently on antibiotic for UTI. Temperature was slightly low at 96.9. WBC count was normal. BUN and creatinine was slightly elevated. Urinalysis negative. Patient was not deemed a candidate for TPA. Acute stroke or TIA was felt to be unlikely and code stroke was canceled. However repeat CT scan confirmed left posterior parietal/occipital wedge-shaped hypodensity compatible with an infarct   Assessment and plan Altered mental status 1. Delirium, have some suspicion of patient being on too many sedating medications since she apparently had a very similar episode at [facility] back in December which was due to patient taking both ativan and norco at the same time. See the EDP note from December 22nd in the chart. 2. Unable to obtain MRI due to pacemaker, repeat CT scan showed possible acute stroke, after much discussion with the patient's sister, it was decided to start the patient on Plavix previously deemed not a candidate for anticoagulation for atrial fibrillation because of falls, was even taken off aspirin couple of years ago 3. EEG does not show any abnormality 4. Speech therapy recommends regular diet with thin  liquids 5. IVF to keep her hydrated 6. Pending palliative care discussion, patient will be continued on Plavix. Will keep patient on all medications for now. If disposition is to go to hospice facility, would DC all medications except the ones which are necessary to keep the patient comfortable   Hypertension resume outpatient medications, diabetes mellitus, hemoglobin A1c 5.4, continue to hold Glucophage, sliding scale insulin, would DC Accu-Cheks if the patient decides to go to hospice facility   COPD-stable continue outpatient regimen   Atrial fibrillation with pacemaker Currently not on anticoagulation, resume diltiazem    DVT prophylaxsis Lovenox   Code Status:    DO NOT RESUSCITATE   Code Status Orders       Family Communication: Discussed in detail with the patient, all imaging results, lab results explained to the patient   Disposition Plan: Palliative care consult, May need hospice facility, social work consultation to start bed search    Consultants:  Neurology    Procedures:  None  Antibiotics: Anti-infectives    Start     Dose/Rate Route Frequency Ordered Stop   02/02/16 0000  cephALEXin (KEFLEX) capsule 500 mg  Status:  Discontinued     500 mg Oral 2 times daily 02/01/16 2346 02/01/16 2353         HPI/Subjective: Confused,unable to communicate   Objective: Filed Vitals:   02/02/16 1740 02/02/16 2219 02/03/16 0146 02/03/16 0537  BP: 100/50 154/57 176/67 178/60  Pulse: 59 59 63 65  Temp: 97.5 F (36.4 C) 98.5 F (36.9 C) 97.8 F (36.6 C) 98.2 F (36.8 C)  TempSrc: Axillary Axillary Axillary Axillary  Resp: Height:      SpO2:  96% 90% 93%    Intake/Output Summary (Last 24 hours) at 02/03/16 1128 Last data filed at 02/02/16 1700  Gross per 24 hour  Intake    240 ml  Output      0 ml  Net    240 ml    Exam:  General: No acute respiratory distress Lungs: Clear to auscultation bilaterally without wheezes or  crackles Cardiovascular: Regular rate and rhythm without murmur gallop or rub normal S1 and S2 Abdomen: Nontender, nondistended, soft, bowel sounds positive, no rebound, no ascites, no appreciable mass Extremities: No significant cyanosis, clubbing, or edema bilateral lower extremities     Data Review   Micro Results Recent Results (from the past 240 hour(s))  MRSA PCR Screening     Status: None   Collection Time: 02/02/16  2:50 AM  Result Value Ref Range Status   MRSA by PCR NEGATIVE NEGATIVE Final    Comment:        The GeneXpert MRSA Assay (FDA approved for NASAL specimens only), is one component of a comprehensive MRSA colonization surveillance program. It is not intended to diagnose MRSA infection nor to guide or monitor treatment for MRSA infections.     Radiology Reports Ct Head Wo Contrast  02/03/2016  ADDENDUM REPORT: 02/03/2016 04:30 ADDENDUM: These results were called by telephone at the time of interpretation on 02/03/2016 at 4:29 am to nurse practitioner Burnadette Peter who verbally acknowledged these results. Electronically Signed   By: Elgie Collard M.D.   On: 02/03/2016 04:30  02/03/2016  CLINICAL DATA:  80 year old female with altered mental status EXAM: CT HEAD WITHOUT CONTRAST TECHNIQUE: Contiguous axial images were obtained from the base of the skull through the vertex without intravenous contrast. COMPARISON:  CT dated 02/01/2016 FINDINGS: The ventricles are dilated and the sulci are prominent compatible with age-related atrophy. Periventricular and deep white matter hypodensities represent chronic microvascular ischemic changes. There is a wedge-shaped area of hypodensity in the left posterior parietal/ occipital lobe compatible with an infarct, possibly subacute. An acute infarct is not excluded. Clinical correlation is recommended. There is no intracranial hemorrhage. No mass effect or midline shift identified. The visualized paranasal sinuses and mastoid air cells are  well aerated. The calvarium is intact. IMPRESSION: No acute intracranial hemorrhage. Left posterior parietal/occipital wedge-shaped hypodensity compatible with an infarct, likely subacute or less likely acute. Correlation with clinical exam and further evaluation with MRI recommended. Age-related atrophy and chronic microvascular ischemic disease. Electronically Signed: By: Elgie Collard M.D. On: 02/03/2016 04:16   Ct Head Wo Contrast  02/01/2016  CLINICAL DATA:  Code stroke.  Altered mental status. EXAM: CT HEAD WITHOUT CONTRAST TECHNIQUE: Contiguous axial images were obtained from the base of the skull through the vertex without intravenous contrast. COMPARISON:  10/04/2015 FINDINGS: The study is mildly motion degraded throughout. There is no evidence of acute large territory infarct, intracranial hemorrhage, mass, midline shift, or extra-axial fluid collection. Moderate cerebral atrophy is unchanged. Cerebral white matter hypodensities are unchanged and nonspecific but compatible with moderate chronic small vessel ischemic disease. Small, chronic infarcts are again seen in the region of the left putamen and in the right cerebellum. Orbits are unremarkable. The paranasal sinuses and mastoid air cells are clear. Calcified atherosclerosis is noted at the skullbase. Gas in the infratemporal fossae and right cavernous sinus is likely venous and may be related to recent venipuncture. IMPRESSION: 1. Motion degraded examination without acute intracranial  abnormality identified. 2. Moderate chronic small vessel ischemic disease. These results were called by telephone at the time of interpretation on 02/01/2016 at 9:40 pm to Dr. Roseanne Reno, who verbally acknowledged these results. Electronically Signed   By: [name] Ache M.D.   On: 02/01/2016 21:42     CBC  Recent Labs Lab 02/01/16 2126 02/01/16 2137  WBC 5.9  --   HGB 12.2 12.9  HCT 37.5 38.0  PLT 182  --   MCV 99.7  --   MCH 32.4  --   MCHC 32.5  --    RDW 12.9  --   LYMPHSABS 1.7  --   MONOABS 0.4  --   EOSABS 0.2  --   BASOSABS 0.0  --     Chemistries   Recent Labs Lab 02/01/16 2126 02/01/16 2137  NA 139 139  K 4.3 4.2  CL 102 99*  CO2 29  --   GLUCOSE 150* 141*  BUN 25* 27*  CREATININE 1.22* 1.20*  CALCIUM 9.4  --   AST 25  --   ALT 11*  --   ALKPHOS 60  --   BILITOT 0.6  --    ------------------------------------------------------------------------------------------------------------------ CrCl cannot be calculated (Unknown ideal weight.). ------------------------------------------------------------------------------------------------------------------  Recent Labs  02/02/16 1550  HGBA1C 5.4   ------------------------------------------------------------------------------------------------------------------ No results for input(s): CHOL, HDL, LDLCALC, TRIG, CHOLHDL, LDLDIRECT in the last 72 hours. ------------------------------------------------------------------------------------------------------------------ No results for input(s): TSH, T4TOTAL, T3FREE, THYROIDAB in the last 72 hours.  Invalid input(s): FREET3 ------------------------------------------------------------------------------------------------------------------ No results for input(s): VITAMINB12, FOLATE, FERRITIN, TIBC, IRON, RETICCTPCT in the last 72 hours.  Coagulation profile  Recent Labs Lab 02/01/16 2126  INR 1.13    No results for input(s): DDIMER in the last 72 hours.  Cardiac Enzymes No results for input(s): CKMB, TROPONINI, MYOGLOBIN in the last 168 hours.  Invalid input(s): CK ------------------------------------------------------------------------------------------------------------------ Invalid input(s): POCBNP   CBG:  Recent Labs Lab 02/02/16 1502 02/02/16 1609 02/02/16 2242 02/03/16 0618  GLUCAP 101* 157* 97 108*       Studies: Ct Head Wo Contrast  02/03/2016  ADDENDUM REPORT: 02/03/2016 04:30  ADDENDUM: These results were called by telephone at the time of interpretation on 02/03/2016 at 4:29 am to nurse practitioner Burnadette Peter who verbally acknowledged these results. Electronically Signed   By: Elgie Collard M.D.   On: 02/03/2016 04:30  02/03/2016  CLINICAL DATA:  80 year old female with altered mental status EXAM: CT HEAD WITHOUT CONTRAST TECHNIQUE: Contiguous axial images were obtained from the base of the skull through the vertex without intravenous contrast. COMPARISON:  CT dated 02/01/2016 FINDINGS: The ventricles are dilated and the sulci are prominent compatible with age-related atrophy. Periventricular and deep white matter hypodensities represent chronic microvascular ischemic changes. There is a wedge-shaped area of hypodensity in the left posterior parietal/ occipital lobe compatible with an infarct, possibly subacute. An acute infarct is not excluded. Clinical correlation is recommended. There is no intracranial hemorrhage. No mass effect or midline shift identified. The visualized paranasal sinuses and mastoid air cells are well aerated. The calvarium is intact. IMPRESSION: No acute intracranial hemorrhage. Left posterior parietal/occipital wedge-shaped hypodensity compatible with an infarct, likely subacute or less likely acute. Correlation with clinical exam and further evaluation with MRI recommended. Age-related atrophy and chronic microvascular ischemic disease. Electronically Signed: By: Elgie Collard M.D. On: 02/03/2016 04:16   Ct Head Wo Contrast  02/01/2016  CLINICAL DATA:  Code stroke.  Altered mental status. EXAM: CT HEAD WITHOUT CONTRAST TECHNIQUE: Contiguous axial images were obtained  from the base of the skull through the vertex without intravenous contrast. COMPARISON:  10/04/2015 FINDINGS: The study is mildly motion degraded throughout. There is no evidence of acute large territory infarct, intracranial hemorrhage, mass, midline shift, or extra-axial fluid collection.  Moderate cerebral atrophy is unchanged. Cerebral white matter hypodensities are unchanged and nonspecific but compatible with moderate chronic small vessel ischemic disease. Small, chronic infarcts are again seen in the region of the left putamen and in the right cerebellum. Orbits are unremarkable. The paranasal sinuses and mastoid air cells are clear. Calcified atherosclerosis is noted at the skullbase. Gas in the infratemporal fossae and right cavernous sinus is likely venous and may be related to recent venipuncture. IMPRESSION: 1. Motion degraded examination without acute intracranial abnormality identified. 2. Moderate chronic small vessel ischemic disease. These results were called by telephone at the time of interpretation on 02/01/2016 at 9:40 pm to Dr. Roseanne RenoStewart, who verbally acknowledged these results. Electronically Signed   By: [name] AcheAllen  Grady M.D.   On: 02/01/2016 21:42      Lab Results  Component Value Date   HGBA1C 5.4 02/02/2016   HGBA1C 5.6 05/29/2014   HGBA1C 6.4* 11/18/2013   Lab Results  Component Value Date   LDLCALC 78 08/05/2013   CREATININE 1.20* 02/01/2016       Scheduled Meds: . clopidogrel  75 mg Oral Daily  . diltiazem  240 mg Oral Daily  . divalproex  250 mg Oral BID  . docusate sodium  100 mg Oral BID  . enoxaparin (LOVENOX) injection  30 mg Subcutaneous Q24H  . fluticasone furoate-vilanterol  1 puff Inhalation q morning - 10a  . insulin aspart  0-9 Units Subcutaneous TID WC  . losartan  50 mg Oral Daily  . mirtazapine  15 mg Oral QHS  . oxybutynin  15 mg Oral q morning - 10a  . potassium chloride SA  20 mEq Oral Daily  . senna  1 tablet Oral BID  . traZODone  25 mg Oral TID   Continuous Infusions: . sodium chloride 75 mL/hr (02/02/16 0010)    Principal Problem:   Altered mental status Active Problems:   Essential hypertension   Facial asymmetry   Dementia   Altered mental state    Time spent: 45 minutes   [facility] Samaritan Medical CenterBROL,Ettore Trebilcock  [facility] Pager [phone]. If 7PM-7AM, please contact night-coverage at [email or web address], password [email or web address] 02/03/2016, 11:28 AM

## 2016-02-04 DIAGNOSIS — Z7189 Other specified counseling: Secondary | ICD-10-CM | POA: Insufficient documentation

## 2016-02-04 DIAGNOSIS — Z515 Encounter for palliative care: Secondary | ICD-10-CM | POA: Insufficient documentation

## 2016-02-04 DIAGNOSIS — R4182 Altered mental status, unspecified: Secondary | ICD-10-CM

## 2016-02-04 DIAGNOSIS — R410 Disorientation, unspecified: Secondary | ICD-10-CM

## 2016-02-04 DIAGNOSIS — F0390 Unspecified dementia without behavioral disturbance: Secondary | ICD-10-CM

## 2016-02-04 LAB — COMPREHENSIVE METABOLIC PANEL
ALK PHOS: 65 U/L (ref 38–126)
ALT: 12 U/L — AB (ref 14–54)
ANION GAP: 11 (ref 5–15)
AST: 29 U/L (ref 15–41)
Albumin: 3.5 g/dL (ref 3.5–5.0)
BILIRUBIN TOTAL: 1.2 mg/dL (ref 0.3–1.2)
BUN: 14 mg/dL (ref 6–20)
CHLORIDE: 105 mmol/L (ref 101–111)
CO2: 22 mmol/L (ref 22–32)
CREATININE: 0.75 mg/dL (ref 0.44–1.00)
Calcium: 9.6 mg/dL (ref 8.9–10.3)
GLUCOSE: 120 mg/dL — AB (ref 65–99)
Potassium: 3.8 mmol/L (ref 3.5–5.1)
Sodium: 138 mmol/L (ref 135–145)
Total Protein: 6.9 g/dL (ref 6.5–8.1)

## 2016-02-04 LAB — GLUCOSE, CAPILLARY
GLUCOSE-CAPILLARY: 89 mg/dL (ref 65–99)
Glucose-Capillary: 104 mg/dL — ABNORMAL HIGH (ref 65–99)
Glucose-Capillary: 111 mg/dL — ABNORMAL HIGH (ref 65–99)

## 2016-02-04 LAB — LIPID PANEL
Cholesterol: 254 mg/dL — ABNORMAL HIGH (ref 0–200)
HDL: 45 mg/dL (ref >40)
LDL CALC: 172 mg/dL — AB (ref 0–99)
TRIGLYCERIDES: 187 mg/dL — AB (ref <150)
Total CHOL/HDL Ratio: 5.6 RATIO
VLDL: 37 mg/dL (ref 0–40)

## 2016-02-04 LAB — CBC
HCT: 40.6 % (ref 36.0–46.0)
HEMOGLOBIN: 13.7 g/dL (ref 12.0–15.0)
MCH: 32.7 pg (ref 26.0–34.0)
MCHC: 33.7 g/dL (ref 30.0–36.0)
MCV: 96.9 fL (ref 78.0–100.0)
Platelets: 186 10*3/uL (ref 150–400)
RBC: 4.19 MIL/uL (ref 3.87–5.11)
RDW: 12.9 % (ref 11.5–15.5)
WBC: 5.6 10*3/uL (ref 4.0–10.5)

## 2016-02-04 NOTE — Progress Notes (Signed)
[facility] PROGRESS NOTE  DALLY OSHEL YNW:[id number] DOB: [birth year] DOA: 02/01/2016 PCP: No primary care provider on file. Brief narrative: 80 y.o. female with history of atrial fibrillation (Coumadin stopped secondary to frequent falls), TIA, COPD (Hospice), CHF, pacemaker placement, diabetes mellitus, and dementia presenting with altered mental status. CT head repeated which reported acute infarct. Neurology on board  Assessment/Plan:  Cerebrovascular accident (CVA) due to embolism of left middle cerebral artery [facility]) - Neurology on board and patient currently undergoing workup - Currently suspected to be secondary to embolus secondary to AFIB - currently on plavix  Afib - continue diltiazem for rate control - currently on plavix  History of UTI in context of patient with AMS - Currently off antibiotics -We'll obtain urinalysis with urine culture  Principal Problem:   Altered mental status -Neurology assisting a patient undergoing workup  Active Problems:   Essential hypertension - We'll hold losartan in lieu of of acute stroke. Continue diltiazem for rate control of A. fib    Dementia -Palliative care on board and will help in terms of disposition recommendations after discussing with family  Code Status: DO NOT RESUSCITATE Family Communication: None at bedside Disposition Plan: Pending palliative care discussion with family   Consultants:  Neurology  Palliative (per prior PN)  Procedures:  Stroke w-up  Antibiotics:  None currently  HPI/Subjective: Pt has no new complaints. confused  Objective: Filed Vitals:   02/04/16 0753 02/04/16 1028  BP:  147/84  Pulse: 100 63  Temp:  98.3 F (36.8 C)  Resp: 16 20    Intake/Output Summary (Last 24 hours) at 02/04/16 1358 Last data filed at 02/03/16 1900  Gross per 24 hour  Intake  147.5 ml  Output      0 ml  Net  147.5 ml   Filed Weights   02/02/16 1434  Weight: 64.949 kg (143 lb 3 oz)     Exam:   General:  Pt in nad, alert and awake  Cardiovascular: no cyanosis, s1 and s2 present  Respiratory: Equal chest rise, no wheezes on auscultation  Abdomen: Soft, nondistended, nontender  Musculoskeletal: No cyanosis  Data Reviewed: Basic Metabolic Panel:  Recent Labs Lab 02/01/16 2126 02/01/16 2137 02/04/16 0525  NA 139 139 138  K 4.3 4.2 3.8  CL 102 99* 105  CO2 29  --  22  GLUCOSE 150* 141* 120*  BUN 25* 27* 14  CREATININE 1.22* 1.20* 0.75  CALCIUM 9.4  --  9.6   Liver Function Tests:  Recent Labs Lab 02/01/16 2126 02/04/16 0525  AST 25 29  ALT 11* 12*  ALKPHOS 60 65  BILITOT 0.6 1.2  PROT 6.5 6.9  ALBUMIN 3.6 3.5   No results for input(s): LIPASE, AMYLASE in the last 168 hours. No results for input(s): AMMONIA in the last 168 hours. CBC:  Recent Labs Lab 02/01/16 2126 02/01/16 2137 02/04/16 0525  WBC 5.9  --  5.6  NEUTROABS 3.6  --   --   HGB 12.2 12.9 13.7  HCT 37.5 38.0 40.6  MCV 99.7  --  96.9  PLT 182  --  186   Cardiac Enzymes: No results for input(s): CKTOTAL, CKMB, CKMBINDEX, TROPONINI in the last 168 hours. BNP (last 3 results) No results for input(s): BNP in the last 8760 hours.  ProBNP (last 3 results) No results for input(s): PROBNP in the last 8760 hours.  CBG:  Recent Labs Lab 02/03/16 1121 02/03/16 1718 02/03/16 2247 02/04/16 0644 02/04/16 1105  GLUCAP  134* 86 110* 104* 89    Recent Results (from the past 240 hour(s))  MRSA PCR Screening     Status: None   Collection Time: 02/02/16  2:50 AM  Result Value Ref Range Status   MRSA by PCR NEGATIVE NEGATIVE Final    Comment:        The GeneXpert MRSA Assay (FDA approved for NASAL specimens only), is one component of a comprehensive MRSA colonization surveillance program. It is not intended to diagnose MRSA infection nor to guide or monitor treatment for MRSA infections.      Studies: Ct Head Wo Contrast  02/03/2016  ADDENDUM REPORT: 02/03/2016  04:30 ADDENDUM: These results were called by telephone at the time of interpretation on 02/03/2016 at 4:29 am to nurse practitioner Burnadette PeterLynch who verbally acknowledged these results. Electronically Signed   By: Elgie CollardArash  Radparvar M.D.   On: 02/03/2016 04:30  02/03/2016  CLINICAL DATA:  80 year old female with altered mental status EXAM: CT HEAD WITHOUT CONTRAST TECHNIQUE: Contiguous axial images were obtained from the base of the skull through the vertex without intravenous contrast. COMPARISON:  CT dated 02/01/2016 FINDINGS: The ventricles are dilated and the sulci are prominent compatible with age-related atrophy. Periventricular and deep white matter hypodensities represent chronic microvascular ischemic changes. There is a wedge-shaped area of hypodensity in the left posterior parietal/ occipital lobe compatible with an infarct, possibly subacute. An acute infarct is not excluded. Clinical correlation is recommended. There is no intracranial hemorrhage. No mass effect or midline shift identified. The visualized paranasal sinuses and mastoid air cells are well aerated. The calvarium is intact. IMPRESSION: No acute intracranial hemorrhage. Left posterior parietal/occipital wedge-shaped hypodensity compatible with an infarct, likely subacute or less likely acute. Correlation with clinical exam and further evaluation with MRI recommended. Age-related atrophy and chronic microvascular ischemic disease. Electronically Signed: By: Elgie CollardArash  Radparvar M.D. On: 02/03/2016 04:16    Scheduled Meds: . clopidogrel  75 mg Oral Daily  . diltiazem  240 mg Oral Daily  . divalproex  250 mg Oral BID  . docusate sodium  100 mg Oral BID  . enoxaparin (LOVENOX) injection  30 mg Subcutaneous Q24H  . fluticasone furoate-vilanterol  1 puff Inhalation q morning - 10a  . insulin aspart  0-9 Units Subcutaneous TID WC  . losartan  50 mg Oral Daily  . mirtazapine  15 mg Oral QHS  . oxybutynin  15 mg Oral q morning - 10a  . potassium  chloride SA  20 mEq Oral Daily  . senna  1 tablet Oral BID  . traZODone  25 mg Oral TID   Continuous Infusions: . sodium chloride 75 mL/hr at 02/04/16 0539    Time spent: > 30 minutes   Penny PiaVEGA, Sherral Dirocco  [facility] Pager [phone]. If 7PM-7AM, please contact night-coverage at [email or web address], password [email or web address] 02/04/2016, 1:58 PM  LOS: 1 day

## 2016-02-04 NOTE — Consult Note (Signed)
Consultation Note Date: 02/04/2016   Patient Name: [name] [name]  DOB: [birth year]  MRN: [id number]  Age / Sex: 80 y.o., female  PCP: No primary care provider on file. Referring Physician: Penny Pia, MD  Reason for Consultation: Establishing goals of care    Clinical Assessment/Narrative: [name] [name] is an 80 y.o. female with a history of atrial fibrillation, Coumadin was stopped secondary to frequent falls TIA, COPD for which the patient has been enrolled in hospice services through hospice of [facility], CHF, pacemaker placement, diabetes mellitus, and dementia brought to the emergency room on 3-23 and code stroke status following an apparent change in mental status. Patient appeared to be somewhat less responsive and more confused than the baseline. No focal weakness was noted. CT scan of her head showed no acute intracranial abnormality. Patient has a history of recurrent urinary tract infections and is currently on antibiotic for UTI. Repeat CT scan of the brain showed left posterior parietal/occipital wedge shaped hypodensity compatible with an infarct. Patient has been seen and evaluated by neurology. She has ongoing confusion and aphasia. Patient is unable to undergo MRI or MRA because of permanent pacemaker placement.  Patient seen and examined. She is awake and alert. She has eaten about 50% of her pured meal. She is awake and reasonably alert. She has slurred speech. She is able to answer some questions appropriately. She has had a bowel movement. She does not appear to be in any acute distress. Call placed and discussed with patient's daughter Cordelia Pen. Family meeting held later this afternoon with Cordelia Pen and the patient's other daughter with regards to goals of care discussions  Patient is widowed. Her husband worked in [employer]. She has 2 daughters. She has had history of dementia and COPD. Patient was also  diagnosed with cervical stenosis in the past. She was having recurrent falls. She was initially in an assisted living in [place]. She went to a skilled nursing facility briefly but has since transitioned to memory care unit at [facility] in [place], [place] for the last year-15 months. For the last several months, she is being followed by hospice of [facility] for COPD. She has had recurrent urinary tract infections.  Patient's daughters note that the patient has had some clinical improvement with her stroke since admission. Disposition discussions undertaken in great detail. On one hand, we discussed sending the patient back to her familiar environment [facility] assisted living/memory care unit with continuation of hospice services. We would continue to follow patient's disease trajectory through hospice services and pursue comfort directed care. Alternatively, patient's daughters wish for her to undergo skilled nursing facility-rehabilitating attempt. They want gentle treatments but they also want to see if she will participate some in physical therapy and have some improvement in strength and functioning. They would like to have additional discussions with case management.  Contacts/Participants in Discussion: Primary Decision Maker:     Relationship to Patient   HCPOA: yes     SUMMARY OF RECOMMENDATIONS: DO NOT RESUSCITATE/DO NOT INTUBATE Goals of care are palliative in nature Patient is enrolled in hospice of [facility] assisted living/memory care unit for COPD for the past several months now Disposition: Patient's 2 daughters are considering transfer back to [place] with continuation of hospice versus a brief attempt/trial of therapies with skilled nursing facility for rehabilitation towards the end of this hospitalization. They wish to maintain the patient's function for as long as possible. They're appreciative of the information provided from  a neurological  standpoint.   request case management to follow to help guide disposition planning.  Code Status/Advance Care Planning: DNR    Code Status Orders        Start     Ordered   02/01/16 2351  Do not attempt resuscitation (DNR)   Continuous    Question Answer Comment  In the event of cardiac or respiratory ARREST Do not call a "code blue"   In the event of cardiac or respiratory ARREST Do not perform Intubation, CPR, defibrillation or ACLS   In the event of cardiac or respiratory ARREST Use medication by any route, position, wound care, and other measures to relive pain and suffering. May use oxygen, suction and manual treatment of airway obstruction as needed for comfort.      02/01/16 2350    Code Status History    Date Active Date Inactive Code Status Order ID Comments User Context   05/29/2014  1:14 AM 05/31/2014  6:27 PM Partial Code [id number]  Therisa DoyneAnastassia Doutova, MD Inpatient   11/18/2013 12:38 AM 11/20/2013  4:43 PM Full Code [id number]  Hillary BowJared M Gardner, DO ED   08/05/2013  5:57 PM 08/10/2013  5:47 PM Full Code [id number]  Drema Dallasurtis J Woods, MD Inpatient    Advance Directive Documentation        Most Recent Value   Type of Advance Directive  Out of facility DNR (pink MOST or yellow form)   Pre-existing out of facility DNR order (yellow form or pink MOST form)     "MOST" Form in Place?        Other Directives:None  Symptom Management:  As above   Palliative Prophylaxis:   Delirium Protocol  Psycho-social/Spiritual:  Support System: Fair Desire for further Chaplaincy support:no Additional Recommendations: Caregiving  Support/Resources  Prognosis: Unable to determine, guarded, ?less than 6 months  Discharge Planning: Skilled Nursing Facility for rehab with Palliative care service follow-up [facility] for physical therapy and then to resume hospice services.   Chief Complaint/ Primary Diagnoses: Present on Admission:    . Altered mental status . Essential hypertension  I have reviewed the medical record, interviewed the patient and family, and examined the patient. The following aspects are pertinent.  Past Medical History  Diagnosis Date  . Hypertrophic cardiomyopathy (HCC)     a. 11/2013 Echo: EF 55-60%, basal inf HK, sev dil LA, no evidence of HCM.  PASP 60mmHg.  [name]. Hypertension   . Situational mixed anxiety and depressive disorder   . Hypercholesterolemia   . Falls frequently     coumadin stopped  . Pulmonary HTN (HCC)     a. has had prior right heart cath in 2010; was felt that most likely due to elevated left sided pressures and would not benefit from vasodilator therapy;  b. 11/2013 Echo: PASP 60mmHg.  [name]. Oxygen dependent     "3L 24/7" (08/05/2013)  . COPD (chronic obstructive pulmonary disease) (HCC)   . TIA (transient ischemic attack)   . Atrial fibrillation (HCC)     a. Chronic; No longer on coumadin 2/2 frequent falls.  . CHF (congestive heart failure) (HCC)   . Pacemaker     a. 04/2012 MDT [id number] [employer] DC PPM, ser #: [id number] H.  . Varicose veins   . History of pneumonia     "couple times; long time ago" (08/05/2013)  . Type II diabetes mellitus (HCC)   . Anemia     "as a child" (  08/05/2013)  . GERD (gastroesophageal reflux disease)   . H/O hiatal hernia   . Osteoarthritis   . Arthritis     "all over me; in all my joints" (08/05/2013)  . Coccyx pain   . Gout     "right foot" (08/05/2013)  . Anxiety   . Depression   . Chest pain     a. 08/2011 Myoview: sm, fixed anteroseptal defect (attenuation), no ischemia, EF 62%.  . Dementia   . Hyposmolality   . Hyponatremia    Social History   Social History  . Marital Status: Married    Spouse Name: N/A  . Number of Children: 2  . Years of Education: 12th   Occupational History  .      Retired   Social History Main Topics  . Smoking status: Former Smoker -- 1.00 packs/day for 40 years    Types: Cigarettes    Quit date:  11/15/2005  . Smokeless tobacco: Never Used  . Alcohol Use: No  . Drug Use: No  . Sexual Activity: No   Other Topics Concern  . 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      Daughter- Alveda Reasons [phone]      Right handed.  Patient has high school education.       Family History  Problem Relation Age of Onset  . Other Father     died in plane crash  . Cancer Mother     lymphoma-NHL  . Coronary artery disease Grandchild   . Heart disease Maternal Grandmother    Scheduled Meds: . clopidogrel  75 mg Oral Daily  . diltiazem  240 mg Oral Daily  . divalproex  250 mg Oral BID  . docusate sodium  100 mg Oral BID  . enoxaparin (LOVENOX) injection  30 mg Subcutaneous Q24H  . fluticasone furoate-vilanterol  1 puff Inhalation q morning - 10a  . insulin aspart  0-9 Units Subcutaneous TID WC  . mirtazapine  15 mg Oral QHS  . oxybutynin  15 mg Oral q morning - 10a  . potassium chloride SA  20 mEq Oral Daily  . senna  1 tablet Oral BID  . traZODone  25 mg Oral TID   Continuous Infusions: . sodium chloride 75 mL/hr at 02/04/16 0539   PRN Meds:.Dextromethorphan-Guaifenesin, HYDROmorphone (DILAUDID) injection, polyvinyl alcohol, prochlorperazine Medications Prior to Admission:  Prior to Admission medications   Medication Sig Start Date End Date Taking? Authorizing Provider  acetaminophen (TYLENOL) 325 MG tablet Take 650 mg by mouth every 6 (six) hours as needed for mild pain or fever.    Yes Historical Provider, MD  benzocaine-menthol (CHLORASEPTIC) 6-10 MG lozenge Take 1 lozenge by mouth every 2 (two) hours as needed for sore throat.    Yes Historical Provider, MD  buPROPion (WELLBUTRIN SR) 150 MG 12 hr tablet Take 150 mg by mouth daily.   Yes Historical Provider, MD  calcium carbonate (TUMS EX) 750 MG chewable tablet Chew 1 tablet by mouth every 6 (six) hours as needed for heartburn.   Yes Historical Provider,  MD  cephALEXin (KEFLEX) 500 MG capsule Take 500 mg by mouth 2 (two) times daily.   Yes Historical Provider, MD  Cranberry-Vitamin C-Probiotic (AZO CRANBERRY) 250-30 MG TABS Take 1 tablet by mouth daily.   Yes Historical Provider, MD  Dextromethorphan-Guaifenesin (DIABETIC TUSSIN MAX ST) 10-200 MG/5ML LIQD Take 10 mLs by mouth every 6 (six) hours  as needed (for cough).   Yes Historical Provider, MD  diltiazem (CARDIZEM CD) 240 MG 24 hr capsule Take 1 capsule (240 mg total) by mouth daily. 01/07/14  Yes Cassell Clement, MD  diphenhydrAMINE (BENADRYL) 25 MG tablet Take 25 mg by mouth every 6 (six) hours as needed for itching.   Yes Historical Provider, MD  divalproex (DEPAKOTE SPRINKLE) 125 MG capsule Take 250 mg by mouth 2 (two) times daily.   Yes Historical Provider, MD  docusate sodium (COLACE) 100 MG capsule Take 100 mg by mouth 2 (two) times daily.   Yes Historical Provider, MD  Fluticasone Furoate-Vilanterol 100-25 MCG/INH AEPB Inhale 1 puff into the lungs every morning.   Yes Historical Provider, MD  HYDROcodone-acetaminophen (NORCO/VICODIN) 5-325 MG per tablet Take 2 tablets by mouth every 4 (four) hours as needed. Patient taking differently: Take 1 tablet by mouth every 4 (four) hours as needed.  08/29/14  Yes Lonia Skinner Sofia, PA-C  losartan (COZAAR) 50 MG tablet TAKE 1 TABLET BY MOUTH EVERY DAY 07/25/14  Yes Cassell Clement, MD  metFORMIN (GLUCOPHAGE) 500 MG tablet Take 500 mg by mouth daily with breakfast.    Yes Historical Provider, MD  mirtazapine (REMERON SOL-TAB) 15 MG disintegrating tablet Take 15 mg by mouth at bedtime.   Yes Historical Provider, MD  Multiple Vitamins-Iron (MULTIVITAMIN/IRON PO) Take 1 tablet by mouth daily.   Yes Historical Provider, MD  oxybutynin (DITROPAN XL) 15 MG 24 hr tablet Take 15 mg by mouth every morning.    Yes Historical Provider, MD  potassium chloride SA (K-DUR,KLOR-CON) 20 MEQ tablet Take 20 mEq by mouth daily. In the morning   Yes Historical Provider, MD   prochlorperazine (COMPAZINE) 10 MG tablet Take 10 mg by mouth every 6 (six) hours as needed for nausea or vomiting.   Yes Historical Provider, MD  senna (SENOKOT) 8.6 MG TABS tablet Take 1 tablet by mouth 2 (two) times daily.   Yes Historical Provider, MD  traZODone (DESYREL) 50 MG tablet Take 25 mg by mouth 3 (three) times daily.    Yes Historical Provider, MD   Allergies  Allergen Reactions  . Aspirin Nausea And Vomiting  . Calan [Verapamil Hcl] Other (See Comments)    Patient states it makes her "out of her mind"; bp bottoms out (takes diltiazem at home)  . Crestor [Rosuvastatin Calcium] Other (See Comments)    Muscle pain  . Escitalopram Oxalate Other (See Comments)    Reaction Unknown   . Lipitor [Atorvastatin Calcium] Other (See Comments)    myalgia  . Lopressor [Metoprolol Tartrate] Other (See Comments)    Blood pressure bottoms out   . Nitroglycerin Other (See Comments)    Reaction unknown  . Norvasc [Amlodipine Besylate] Other (See Comments)    fatigue  . Nsaids Nausea Only  . Oxycodone Other (See Comments)    Reaction unknown  . Prednisone Swelling  . Sulfa Antibiotics Nausea Only and Other (See Comments)    Makes her stomach hurt  . Vimovo [Naproxen-Esomeprazole] Nausea Only  . Penicillins Hives and Rash    Review of Systems Denies pain  Physical Exam Frail weak elderly lady No acute distress Confused, slurred speech S1-S2 Lungs clear Generalized weakness Abdomen soft  Vital Signs: BP 138/51 mmHg  Pulse 60  Temp(Src) 97 F (36.1 C) (Oral)  Resp 18  Ht 5\' 4"  (1.626 m)  Wt 64.949 kg (143 lb 3 oz)  BMI 24.57 kg/m2  SpO2 99%  SpO2: SpO2: 99 % O2 Device:SpO2: 99 %  O2 Flow Rate: .   IO: Intake/output summary:  Intake/Output Summary (Last 24 hours) at 02/04/16 1714 Last data filed at 02/03/16 1900  Gross per 24 hour  Intake  147.5 ml  Output      0 ml  Net  147.5 ml    LBM: Last BM Date: 02/03/16 Baseline Weight: Weight: 64.949 kg (143 lb 3  oz) Most recent weight: Weight: 64.949 kg (143 lb 3 oz)      Palliative Assessment/Data:  Flowsheet Rows        Most Recent Value   Intake Tab    Referral Department  Hospitalist   Unit at Time of Referral  Med/Surg Unit   Palliative Care Primary Diagnosis  Neurology   Palliative Care Type  New Palliative care   Reason for referral  Clarify Goals of Care   Clinical Assessment    Palliative Performance Scale Score  30%   Psychosocial & Spiritual Assessment    Palliative Care Outcomes    Patient/Family meeting held?  Yes   Who was at the meeting?  2 dtrs   Palliative Care follow-up planned  Yes, Facility      Additional Data Reviewed:  CBC:    Component Value Date/Time   WBC 5.6 02/04/2016 0525   WBC 5.3 03/10/2015 0515   HGB 13.7 02/04/2016 0525   HGB 13.3 03/10/2015 0515   HCT 40.6 02/04/2016 0525   HCT 38.9 03/10/2015 0515   PLT 186 02/04/2016 0525   PLT 174 03/10/2015 0515   MCV 96.9 02/04/2016 0525   MCV 96 03/10/2015 0515   NEUTROABS 3.6 02/01/2016 2126   NEUTROABS 4.7 03/10/2015 0515   LYMPHSABS 1.7 02/01/2016 2126   LYMPHSABS 0.4* 03/10/2015 0515   MONOABS 0.4 02/01/2016 2126   MONOABS 0.3 03/10/2015 0515   EOSABS 0.2 02/01/2016 2126   EOSABS 0.0 03/10/2015 0515   BASOSABS 0.0 02/01/2016 2126   BASOSABS 0.0 03/10/2015 0515   Comprehensive Metabolic Panel:    Component Value Date/Time   NA 138 02/04/2016 0525   NA 135 03/10/2015 0515   K 3.8 02/04/2016 0525   K 4.1 03/10/2015 0515   CL 105 02/04/2016 0525   CL 98* 03/10/2015 0515   CO2 22 02/04/2016 0525   CO2 28 03/10/2015 0515   BUN 14 02/04/2016 0525   BUN 20 03/10/2015 0515   CREATININE 0.75 02/04/2016 0525   CREATININE 0.73 03/10/2015 0515   GLUCOSE 120* 02/04/2016 0525   GLUCOSE 206* 03/10/2015 0515   CALCIUM 9.6 02/04/2016 0525   CALCIUM 9.3 03/10/2015 0515   AST 29 02/04/2016 0525   AST 26 03/08/2015 1912   ALT 12* 02/04/2016 0525   ALT 11* 03/08/2015 1912   ALKPHOS 65 02/04/2016  0525   ALKPHOS 70 03/08/2015 1912   BILITOT 1.2 02/04/2016 0525   BILITOT 0.9 03/08/2015 1912   PROT 6.9 02/04/2016 0525   PROT 7.9 03/08/2015 1912   ALBUMIN 3.5 02/04/2016 0525   ALBUMIN 4.6 03/08/2015 1912     Time In:  1500 Time Out:   1600 Time Total:   60 Greater than 50%  of this time was spent counseling and coordinating care related to the above assessment and plan.  Signed by: Rosalin Hawking, MD [id number] Rosalin Hawking, MD  02/04/2016, 5:14 PM  Please contact Palliative Medicine Team phone at [phone] for questions and concerns.

## 2016-02-04 NOTE — Progress Notes (Addendum)
STROKE TEAM PROGRESS NOTE   HISTORY OF PRESENT ILLNESS at admission [name] [name] is an 80 y.o. female with a history of atrial fibrillation, TIA, COPD, CHF, pacemaker placement, diabetes mellitus, and dementia brought to the emergency room and code stroke status following an apparent change in mental status. Patient appeared to be somewhat less responsive and more confused than the baseline. No focal weakness was noted. CT scan of her head showed no acute intracranial abnormality. Patient has a history of recurrent urinary tract infections and is currently on antibiotic for UTI. Temperature was slightly low at 96.9. WBC count was normal. BUN and creatinine was slightly elevated. Urinalysis results pending. Patient was not deemed a candidate for TPA. Acute stroke or TIA was felt to be unlikely and code stroke was canceled.   SUBJECTIVE (INTERVAL HISTORY) Sister at the bedside.  Patient had a much better day.  She is eating and not as agitated.  She is complaining of bilateral knee pain but her aphasia makes it difficult to further assess ROS     OBJECTIVE Temp:  [97 F (36.1 C)-98.4 F (36.9 C)] 97 F (36.1 C) (03/26 1453) Pulse Rate:  [60-102] 60 (03/26 1453) Cardiac Rhythm:  [-] Ventricular paced (03/26 0831) Resp:  [16-20] 18 (03/26 1453) BP: (132-147)/(51-84) 138/51 mmHg (03/26 1453) SpO2:  [95 %-99 %] 99 % (03/26 1453)  CBC:   Recent Labs Lab 02/01/16 2126 02/01/16 2137 02/04/16 0525  WBC 5.9  --  5.6  NEUTROABS 3.6  --   --   HGB 12.2 12.9 13.7  HCT 37.5 38.0 40.6  MCV 99.7  --  96.9  PLT 182  --  186    Basic Metabolic Panel:   Recent Labs Lab 02/01/16 2126 02/01/16 2137 02/04/16 0525  NA 139 139 138  K 4.3 4.2 3.8  CL 102 99* 105  CO2 29  --  22  GLUCOSE 150* 141* 120*  BUN 25* 27* 14  CREATININE 1.22* 1.20* 0.75  CALCIUM 9.4  --  9.6    Lipid Panel:     Component Value Date/Time   CHOL 254* 02/04/2016 0525   TRIG 187* 02/04/2016 0525   HDL 45 02/04/2016  0525   CHOLHDL 5.6 02/04/2016 0525   VLDL 37 02/04/2016 0525   LDLCALC 172* 02/04/2016 0525   HgbA1c:  Lab Results  Component Value Date   HGBA1C 5.4 02/02/2016   Urine Drug Screen:     Component Value Date/Time   LABOPIA NONE DETECTED 02/01/2016 2325   LABOPIA POSITIVE 02/07/2015 2047   COCAINSCRNUR NONE DETECTED 02/01/2016 2325   LABBENZ NONE DETECTED 02/01/2016 2325   LABBENZ POSITIVE 02/07/2015 2047   AMPHETMU NONE DETECTED 02/01/2016 2325   AMPHETMU NEGATIVE 02/07/2015 2047   THCU NONE DETECTED 02/01/2016 2325   THCU NEGATIVE 02/07/2015 2047   LABBARB NONE DETECTED 02/01/2016 2325   LABBARB NEGATIVE 02/07/2015 2047      IMAGING  Ct Head Wo Contrast 02/03/2016   No acute intracranial hemorrhage. Left posterior parietal/occipital wedge-shaped hypodensity compatible with an infarct, likely subacute or less likely acute. Correlation with clinical exam and further evaluation with MRI recommended. Age-related atrophy and chronic microvascular ischemic disease.     Ct Head Wo Contrast 02/01/2016   1. Motion degraded examination without acute intracranial abnormality identified.  2. Moderate chronic small vessel ischemic disease.     EEG 02/02/2016 This awake EEG is abnormal due to the presence of: 1. Mild diffuse slowing of the waking background 2. Additional  focal slowing over the left temporal region Clinical Correlation of the above findings indicates bilateral cerebral dysfunction that is non-specific in etiology and can be seen with hypoxic/ischemic injury, toxic/metabolic encephalopathies, neurodegenerative disorders, or medication effect. Additional focal slowing over the left temporal region indicates focal cerebral dysfunction in this region suggestive of underlying structural or physiologic abnormality. The absence of epileptiform discharges does not rule out a clinical diagnosis of epilepsy. Clinical correlation is advised.    PHYSICAL EXAM Physical  Exam General - Well nourished, well developed, sleeping deeply Cardiovascular - Regular rate and rhythm Pulmonary: CTA Abdomen: NT, ND, normal bowel sounds Extremities: No C/C/E  Neurological Exam Mental Status: opens eyes and attempts to vocalize PERRL, OCR, face grossly symmetric, weak cough  Motor:  Indicates pain in bilateral knees and lower extremity exam is limited by pain.  The upper extremities appear to be non-focal and strong but exam is limited by effort  Sensory  Withdraws to pain  Coordination/Gait Could not be tested  ASSESSMENT/PLAN Ms. [name] [name] is a 80 y.o. female with history of atrial fibrillation (Coumadin stopped secondary to frequent falls), TIA, COPD (Hospice), CHF, pacemaker placement, diabetes mellitus, and dementia presenting with altered mental status.. She did not receive IV t-PA due to low probability for stroke.  Stroke:  Dominant infarct probably embolic secondary to atrial fibrillation.  Resultant  Confusion and aphasia  MRI  permanent pacemaker  MRA  permanent pacemaker  CT - Left posterior parietal/occipital wedge-shaped hypodensity compatible with an infarct.  Carotid Doppler - cancelled per discussion with Dr. Earl LitesGregory who spoke with family - Hospice patient  2D Echo pending  LDL - 172  EEG - see above  HgbA1c 5.4  VTE prophylaxis - Lovenox Diet regular Room service appropriate?: Yes; Fluid consistency:: Thin  No antithrombotic prior to admission, now on clopidogrel 75 mg daily  Patient counseled to be compliant with her antithrombotic medications  Ongoing aggressive stroke risk factor management  Therapy recommendations: Pending  Disposition: Pending  Hypertension  Occasional low blood pressure otherwise stable  Permissive hypertension (OK if < 220/120) but gradually normalize in 5-7 days  Hyperlipidemia  Home meds:  No lipid lowering medications prior to admission  LDL pending goal < 70   Diabetes  HgbA1c 5.4,  goal < 7.0  Controlled  Other Stroke Risk Factors  Advanced age  Cigarette smoker, quit smoking 10 years ago.  Obesity, Body mass index is 24.57 kg/(m^2).   Hx stroke/TIA  Other Active Problems  Renal insufficiency  Hospice patient for COPD  Hospital day # 1  ATTENDING NOTE: Patient was seen and examined by me personally. Documentation reflects findings. The laboratory and radiographic studies reviewed by me. ROS pertinent positives could not be fully documented due to LOC  Condition:  Agitated initially now sedated  Assessment and plan completed by me personally and fully documented above. Plans/Recommendations include:     See Palliative consult   Family will observe how patient does to determine what they may want to do next.  They did day that the would not want carotid surgery if a lesion was found.  I cancelled carotid   SIGNED BY: Dr. Sula Sodahere Sanders Manninen      To contact Stroke Continuity provider, please refer to [email or web address]. After hours, contact General Neurology

## 2016-02-04 NOTE — Progress Notes (Signed)
Pt spit out nighttime meds and refused to take them.  Daughter was in the room when this occurred.  MD notified.  No new orders given.  Will continue to monitor.   Estanislado EmmsAshley Schwarz, RN

## 2016-02-05 ENCOUNTER — Inpatient Hospital Stay (HOSPITAL_COMMUNITY)

## 2016-02-05 DIAGNOSIS — G459 Transient cerebral ischemic attack, unspecified: Secondary | ICD-10-CM

## 2016-02-05 DIAGNOSIS — I63412 Cerebral infarction due to embolism of left middle cerebral artery: Principal | ICD-10-CM

## 2016-02-05 DIAGNOSIS — I480 Paroxysmal atrial fibrillation: Secondary | ICD-10-CM | POA: Insufficient documentation

## 2016-02-05 LAB — GLUCOSE, CAPILLARY
GLUCOSE-CAPILLARY: 106 mg/dL — AB (ref 65–99)
GLUCOSE-CAPILLARY: 109 mg/dL — AB (ref 65–99)
GLUCOSE-CAPILLARY: 113 mg/dL — AB (ref 65–99)
GLUCOSE-CAPILLARY: 87 mg/dL (ref 65–99)
Glucose-Capillary: 109 mg/dL — ABNORMAL HIGH (ref 65–99)

## 2016-02-05 LAB — ECHOCARDIOGRAM COMPLETE
Height: 64 in
WEIGHTICAEL: 2291 [oz_av]

## 2016-02-05 MED ORDER — CLOPIDOGREL BISULFATE 75 MG PO TABS: 75.0000 mg | ORAL_TABLET | Freq: Every day | ORAL | Status: AC

## 2016-02-05 MED ORDER — PRAVASTATIN SODIUM 40 MG PO TABS
40.0000 mg | ORAL_TABLET | Freq: Every day | ORAL | Status: DC
  Administered 2016-02-05 – 2016-02-06 (×2): 40 mg via ORAL
  Filled 2016-02-05 (×2): qty 1

## 2016-02-05 MED ORDER — MORPHINE SULFATE (CONCENTRATE) 20 MG/ML PO SOLN: 10.0000 mg | ORAL | Status: DC | PRN

## 2016-02-05 NOTE — Progress Notes (Signed)
CM spoke with patient's daughter, [name] PlummerSherri [name], via phone in regards to discharge planning.  Family is currently considering the possibility of short-term SNF rehab [facility]  CM explained that patient will need to be seen by therapy and have a qualifying recommendation for Medicare to pay for SNF.  Daughter verbalized understanding and stated that they have paid privately for SNF in the past.  CM received permission to have CSW fax patient out to [facility] if therapy recommends SNF.  Daughter is aware that discharge could likely happen as soon as tomorrow 02/06/16 and is planning to research facilities in preparation for that.  She is aware that SNF placement is dependent on recommendation by therapy, bed offers and bed availability on the day of discharge.  CSW is aware and will assist with discharge planning.   Elmer Balesourtney Ysabel Stankovich RN, MSN [phone]

## 2016-02-05 NOTE — Progress Notes (Signed)
Daily Progress Note   Patient Name: [name] [name]       Date: 02/05/2016 DOB: 1936-01-10  Age: 80 y.o. MRN#: [id number] Attending Physician: Richarda Overlie, MD Primary Care Physician: No primary care provider on file. Admit Date: 02/01/2016  Reason for Consultation/Follow-up: Establishing goals of care  Subjective:  not as awake/ alert as yesterday  Interval Events:  case management note reviewed Discussed with Dr Susie Cassette Call placed and discussed with daughter Cordelia Pen, discussed that the patient did not look as awake/ alert as yesterday, recapped her current disease trajectory of stroke, her underlying conditions of dementia and copd.  Patient's daughter Cordelia Pen wishes for the patient to undergo PT evaluation, she wishes for SNF rehab towards the end of this hospitalization.  All questions answered to the best of my ability.   Length of Stay: 2 days  Current Medications: Scheduled Meds:  . clopidogrel  75 mg Oral Daily  . diltiazem  240 mg Oral Daily  . divalproex  250 mg Oral BID  . docusate sodium  100 mg Oral BID  . enoxaparin (LOVENOX) injection  30 mg Subcutaneous Q24H  . fluticasone furoate-vilanterol  1 puff Inhalation q morning - 10a  . insulin aspart  0-9 Units Subcutaneous TID WC  . mirtazapine  15 mg Oral QHS  . oxybutynin  15 mg Oral q morning - 10a  . potassium chloride SA  20 mEq Oral Daily  . pravastatin  40 mg Oral q1800  . senna  1 tablet Oral BID  . traZODone  25 mg Oral TID    Continuous Infusions: . sodium chloride 75 mL/hr at 02/04/16 2229    PRN Meds: Dextromethorphan-Guaifenesin, HYDROmorphone (DILAUDID) injection, polyvinyl alcohol, prochlorperazine  Physical Exam: Physical Exam             Weak pale appearing lady Not as awake/alert as yesterday s1  s2 Clear Abdomen soft Generalized weakness  Vital Signs: BP 161/67 mmHg  Pulse 59  Temp(Src) 98.1 F (36.7 C) (Axillary)  Resp 18  Ht  (1.626 m)  Wt 64.949 kg (143 lb 3 oz)  BMI 24.57 kg/m2  SpO2 100% SpO2: SpO2: 100 % O2 Device: O2 Device: Not Delivered O2 Flow Rate:    Intake/output summary:  Intake/Output Summary (Last 24 hours) at 02/05/16 1402 Last data filed at  02/04/16 1800  Gross per 24 hour  Intake    240 ml  Output      0 ml  Net    240 ml   LBM: Last BM Date: 02/03/16 Baseline Weight: Weight: 64.949 kg (143 lb 3 oz) Most recent weight: Weight: 64.949 kg (143 lb 3 oz)       Palliative Assessment/Data: Flowsheet Rows        Most Recent Value   Intake Tab    Referral Department  Hospitalist   Unit at Time of Referral  Med/Surg Unit   Palliative Care Primary Diagnosis  Neurology   Date Notified  02/03/16   Palliative Care Type  Return patient Palliative Care   Reason for referral  Clarify Goals of Care   Date of Admission  02/01/16   Date first seen by Palliative Care  02/04/16   # of days Palliative referral response time  1 Day(s)   # of days IP prior to Palliative referral  2   Clinical Assessment    Palliative Performance Scale Score  30%   Psychosocial & Spiritual Assessment    Palliative Care Outcomes    Patient/Family meeting held?  Yes   Who was at the meeting?  2 dtrs   Palliative Care follow-up planned  Yes, Facility      Additional Data Reviewed: CBC    Component Value Date/Time   WBC 5.6 02/04/2016 0525   WBC 5.3 03/10/2015 0515   RBC 4.19 02/04/2016 0525   RBC 4.07 03/10/2015 0515   HGB 13.7 02/04/2016 0525   HGB 13.3 03/10/2015 0515   HCT 40.6 02/04/2016 0525   HCT 38.9 03/10/2015 0515   PLT 186 02/04/2016 0525   PLT 174 03/10/2015 0515   MCV 96.9 02/04/2016 0525   MCV 96 03/10/2015 0515   MCH 32.7 02/04/2016 0525   MCH 32.7 03/10/2015 0515   MCHC 33.7 02/04/2016 0525   MCHC 34.2 03/10/2015 0515   RDW 12.9 02/04/2016  0525   RDW 13.3 03/10/2015 0515   LYMPHSABS 1.7 02/01/2016 2126   LYMPHSABS 0.4* 03/10/2015 0515   MONOABS 0.4 02/01/2016 2126   MONOABS 0.3 03/10/2015 0515   EOSABS 0.2 02/01/2016 2126   EOSABS 0.0 03/10/2015 0515   BASOSABS 0.0 02/01/2016 2126   BASOSABS 0.0 03/10/2015 0515    CMP     Component Value Date/Time   NA 138 02/04/2016 0525   NA 135 03/10/2015 0515   K 3.8 02/04/2016 0525   K 4.1 03/10/2015 0515   CL 105 02/04/2016 0525   CL 98* 03/10/2015 0515   CO2 22 02/04/2016 0525   CO2 28 03/10/2015 0515   GLUCOSE 120* 02/04/2016 0525   GLUCOSE 206* 03/10/2015 0515   BUN 14 02/04/2016 0525   BUN 20 03/10/2015 0515   CREATININE 0.75 02/04/2016 0525   CREATININE 0.73 03/10/2015 0515   CALCIUM 9.6 02/04/2016 0525   CALCIUM 9.3 03/10/2015 0515   PROT 6.9 02/04/2016 0525   PROT 7.9 03/08/2015 1912   ALBUMIN 3.5 02/04/2016 0525   ALBUMIN 4.6 03/08/2015 1912   AST 29 02/04/2016 0525   AST 26 03/08/2015 1912   ALT 12* 02/04/2016 0525   ALT 11* 03/08/2015 1912   ALKPHOS 65 02/04/2016 0525   ALKPHOS 70 03/08/2015 1912   BILITOT 1.2 02/04/2016 0525   BILITOT 0.9 03/08/2015 1912   GFRNONAA >60 02/04/2016 0525   GFRNONAA >60 03/10/2015 0515   GFRNONAA >60 10/29/2014 1011   GFRAA >60 02/04/2016 9528  GFRAA >60 03/10/2015 0515   GFRAA >60 10/29/2014 1011       Problem List:  Patient Active Problem List   Diagnosis Date Noted  . Encounter for palliative care   . Goals of care, counseling/discussion   . Altered mental state   . Cerebrovascular accident (CVA) due to embolism of left middle cerebral artery (HCC)   . Dementia   . Facial asymmetry 02/01/2016  . Altered mental status 02/01/2016  . Closed head injury 05/30/2014  . Pulmonary hypertension, moderate to severe (HCC) 05/30/2014  . DM (diabetes mellitus), type 2 with neurological complications (HCC) 05/30/2014  . Hypoglycemia 05/29/2014  . Hypokalemia 05/29/2014  . Subdural hematoma caused by concussion  (HCC) 05/28/2014  . hemorrhagic cerebral contusion 05/28/2014  . Dyslipidemia 03/07/2014  . Chronic respiratory failure (HCC) 01/01/2014  . Chest pain   . UTI (urinary tract infection) 11/19/2013  . Compression fracture of L4 lumbar vertebra (HCC) 11/18/2013  . Syncope 11/18/2013  . Protein-calorie malnutrition, severe (HCC) 11/18/2013  . Syncope and collapse 11/17/2013  . Closed compression fracture of L4 lumbar vertebra (HCC) 11/17/2013  . Gait difficulty 10/05/2013  . Fall (on)(from) incline, initial encounter 10/03/2013  . Essential hypertension, benign 10/03/2013  . Unsteady gait 10/03/2013  . Peripheral neuropathy (HCC) 10/03/2013  . Back pain 08/06/2013  . Near syncope 08/06/2013  . Pulmonary hypertension (HCC) 08/05/2013  . Abnormality of gait 05/24/2013  . Acute respiratory distress (HCC) 05/08/2013  . Pulmonary edema, acute (HCC) 05/08/2013  . Benign hypertensive heart disease without heart failure   . Pulmonary HTN (HCC)   . Oxygen dependent   . COPD (chronic obstructive pulmonary disease) (HCC)   . Fatigue due to cardiopulmonary and deconditioning 04/23/2013  . Memory deficit 02/07/2013  . Memory change 12/11/2012  . TIA (transient ischemic attack) 02/19/2012  . Chest pain at rest 08/05/2011  . Type II or unspecified type diabetes mellitus without mention of complication, uncontrolled 06/12/2011  . Fall 03/22/2011  . Mitral regurgitation 03/13/2011  . Hypertrophic cardiomyopathy (HCC)   . Osteoarthritis   . Situational mixed anxiety and depressive disorder   . Pacemaker 02/26/2011  . Atrial fibrillation (HCC) 01/28/2011  . CHRONIC OBSTRUCTIVE PULMONARY DISEASE, MODERATE 05/23/2010  . DEPRESSIVE DISORDER NOT ELSEWHERE CLASSIFIED 04/11/2010  . HYPERLIPIDEMIA 05/31/2009  . Essential hypertension 05/31/2009  . ALLERGIC RHINITIS 05/31/2009  . SHORTNESS OF BREATH 05/31/2009  . Cough 05/31/2009     Palliative Care Assessment & Plan    1.Code Status:  DNR     Code Status Orders        Start     Ordered   02/01/16 2351  Do not attempt resuscitation (DNR)   Continuous    Question Answer Comment  In the event of cardiac or respiratory ARREST Do not call a "code blue"   In the event of cardiac or respiratory ARREST Do not perform Intubation, CPR, defibrillation or ACLS   In the event of cardiac or respiratory ARREST Use medication by any route, position, wound care, and other measures to relive pain and suffering. May use oxygen, suction and manual treatment of airway obstruction as needed for comfort.      02/01/16 2350    Code Status History    Date Active Date Inactive Code Status Order ID Comments User Context   05/29/2014  1:14 AM 05/31/2014  6:27 PM Partial Code [id number]  Therisa DoyneAnastassia Doutova, MD Inpatient   11/18/2013 12:38 AM 11/20/2013  4:43 PM Full Code [id number]  Heywood IlesJared M  Julian Reil, DO ED   08/05/2013  5:57 PM 08/10/2013  5:47 PM Full Code [id number]  Drema Dallas, MD Inpatient    Advance Directive Documentation        Most Recent Value   Type of Advance Directive  Out of facility DNR (pink MOST or yellow form)   Pre-existing out of facility DNR order (yellow form or pink MOST form)     "MOST" Form in Place?         2. Goals of Care/Additional Recommendations:  Desire for further Chaplaincy support:no  Psycho-social Needs: Caregiving  Support/Resources  3. Symptom Management:      1. As above   4. Palliative Prophylaxis:   Delirium Protocol  5. Prognosis: Unable to determine  6. Discharge Planning: Skilled Nursing Facility for rehab   Care plan was discussed with  Patient's daughter Cordelia Pen   Thank you for allowing the Palliative Medicine Team to assist in the care of this patient.   Time In:  11 Time Out: 1125 Total Time 25 Prolonged Time Billed  no        [phone]  Rosalin Hawking, MD  02/05/2016, 2:02 PM  Please contact Palliative Medicine Team phone at [phone] for questions and concerns.

## 2016-02-05 NOTE — Progress Notes (Signed)
  Echocardiogram 2D Echocardiogram has been performed.  Tye SavoyCasey N Masiyah Jorstad 02/05/2016, 10:41 AM

## 2016-02-05 NOTE — Evaluation (Signed)
Speech Language Pathology Evaluation Patient Details Name: [name] [name] MRN: [id number] DOB: 02/06/1936 Today's Date: 02/05/2016 Time: [id number] SLP Time Calculation (min) (ACUTE ONLY): 24 min  Problem List:  Patient Active Problem List   Diagnosis Date Noted  . Encounter for palliative care   . Goals of care, counseling/discussion   . Altered mental state   . Cerebrovascular accident (CVA) due to embolism of left middle cerebral artery (HCC)   . Dementia   . Facial asymmetry 02/01/2016  . Altered mental status 02/01/2016  . Closed head injury 05/30/2014  . Pulmonary hypertension, moderate to severe (HCC) 05/30/2014  . DM (diabetes mellitus), type 2 with neurological complications (HCC) 05/30/2014  . Hypoglycemia 05/29/2014  . Hypokalemia 05/29/2014  . Subdural hematoma caused by concussion (HCC) 05/28/2014  . hemorrhagic cerebral contusion 05/28/2014  . Dyslipidemia 03/07/2014  . Chronic respiratory failure (HCC) 01/01/2014  . Chest pain   . UTI (urinary tract infection) 11/19/2013  . Compression fracture of L4 lumbar vertebra (HCC) 11/18/2013  . Syncope 11/18/2013  . Protein-calorie malnutrition, severe (HCC) 11/18/2013  . Syncope and collapse 11/17/2013  . Closed compression fracture of L4 lumbar vertebra (HCC) 11/17/2013  . Gait difficulty 10/05/2013  . Fall (on)(from) incline, initial encounter 10/03/2013  . Essential hypertension, benign 10/03/2013  . Unsteady gait 10/03/2013  . Peripheral neuropathy (HCC) 10/03/2013  . Back pain 08/06/2013  . Near syncope 08/06/2013  . Pulmonary hypertension (HCC) 08/05/2013  . Abnormality of gait 05/24/2013  . Acute respiratory distress (HCC) 05/08/2013  . Pulmonary edema, acute (HCC) 05/08/2013  . Benign hypertensive heart disease without heart failure   . Pulmonary HTN (HCC)   . Oxygen dependent   . COPD (chronic obstructive pulmonary disease) (HCC)   . Fatigue due to cardiopulmonary and deconditioning 04/23/2013  . Memory  deficit 02/07/2013  . Memory change 12/11/2012  . TIA (transient ischemic attack) 02/19/2012  . Chest pain at rest 08/05/2011  . Type II or unspecified type diabetes mellitus without mention of complication, uncontrolled 06/12/2011  . Fall 03/22/2011  . Mitral regurgitation 03/13/2011  . Hypertrophic cardiomyopathy (HCC)   . Osteoarthritis   . Situational mixed anxiety and depressive disorder   . Pacemaker 02/26/2011  . Atrial fibrillation (HCC) 01/28/2011  . CHRONIC OBSTRUCTIVE PULMONARY DISEASE, MODERATE 05/23/2010  . DEPRESSIVE DISORDER NOT ELSEWHERE CLASSIFIED 04/11/2010  . HYPERLIPIDEMIA 05/31/2009  . Essential hypertension 05/31/2009  . ALLERGIC RHINITIS 05/31/2009  . SHORTNESS OF BREATH 05/31/2009  . Cough 05/31/2009   Past Medical History:  Past Medical History  Diagnosis Date  . Hypertrophic cardiomyopathy (HCC)     a. 11/2013 Echo: EF 55-60%, basal inf HK, sev dil LA, no evidence of HCM.  PASP 60mmHg.  [name]. Hypertension   . Situational mixed anxiety and depressive disorder   . Hypercholesterolemia   . Falls frequently     coumadin stopped  . Pulmonary HTN (HCC)     a. has had prior right heart cath in 2010; was felt that most likely due to elevated left sided pressures and would not benefit from vasodilator therapy;  b. 11/2013 Echo: PASP 60mmHg.  [name]. Oxygen dependent     "3L 24/7" (08/05/2013)  . COPD (chronic obstructive pulmonary disease) (HCC)   . TIA (transient ischemic attack)   . Atrial fibrillation (HCC)     a. Chronic; No longer on coumadin 2/2 frequent falls.  . CHF (congestive heart failure) (HCC)   . Pacemaker     a. 04/2012 MDT [id number] [employer] [employer] [facility] PPM, ser #:  [id number] H.  . Varicose veins   . History of pneumonia     "couple times; long time ago" (08/05/2013)  . Type II diabetes mellitus (HCC)   . Anemia     "as a child" (08/05/2013)  . GERD (gastroesophageal reflux disease)   . H/O hiatal hernia   . Osteoarthritis   . Arthritis     "all over me; in all my  joints" (08/05/2013)  . Coccyx pain   . Gout     "right foot" (08/05/2013)  . Anxiety   . Depression   . Chest pain     a. 08/2011 Myoview: sm, fixed anteroseptal defect (attenuation), no ischemia, EF 62%.  . Dementia   . Hyposmolality   . Hyponatremia    Past Surgical History:  Past Surgical History  Procedure Laterality Date  . Total knee arthroplasty Right 2011  . Tonsillectomy    . Appendectomy    . Abdominal hysterectomy      "partial" (08/05/2013)  . Dilation and curettage of uterus      "several" (08/05/2013)  . Foot neuroma surgery Right   . Insert / replace / remove pacemaker  1998; 2000's; 2014  . Cardiac catheterization      "more than once" (08/05/2013)  . Back surgery      "bone spur removed; mid back" (08/05/2013)  . Pacemaker generator change N/A 04/24/2012    Procedure: PACEMAKER GENERATOR CHANGE;  Surgeon: Duke Salvia, MD;  Location: [facility] CATH LAB;  Service: Cardiovascular;  Laterality: N/A;   HPI:  80 y.o. female with a history of atrial fibrillation, TIA, COPD, CHF, pacemaker placement, diabetes mellitus, and dementia brought to the emergency room and code stroke status following an apparent change in mental status. Patient appeared to be somewhat less responsive and more confused than baseline. No focal weakness was noted. CT scan of her head showed no acute intracranial abnormality. Patient has a history of recurrent urinary tract infections and is currently on antibiotic for UTI. Temperature was slightly low at 96.9. WBC count was normal. BUN and creatinine was slightly elevated. Urinalysis negative. Patient was not deemed a candidate for TPA. Acute stroke or TIA was felt to be unlikely and code stroke was canceled.    Assessment / Plan / Recommendation Clinical Impression  Pt has a mixed expressive/receptive fluent aphasia, also impacted by baseline cognitive deficits. She responds "no" to most yes/no questions, prompts for one-step commands, and  responsive/confrontational naming tasks despite multimodal cueing and encouragement from family. Question if this is related to language difficulty versus pt truly not wanting to participate. Spontaneous verbal output is comprised primarily of jargon, but littered with occasional clear phrases. Pt will need additional SLP f/u acutely and at SNF to maximize functional communication, including additional differential diagnosis of abilities. Discussed in depth with niece (present) and sister (via phone). Attempted to call daughter as well. Will continue efforts.    SLP Assessment  Patient needs continued Speech Lanaguage Pathology Services    Follow Up Recommendations  Skilled Nursing facility    Frequency and Duration min 2x/week  2 weeks      SLP Evaluation Prior Functioning  Cognitive/Linguistic Baseline: Baseline deficits Baseline deficit details: h/o dementia, from memory care unit Type of Home: Skilled Nursing Facility Available Help at Discharge: Skilled Nursing Facility   Cognition  Overall Cognitive Status: Difficult to assess (h/o dementia, now with aphasia)    Comprehension  Auditory Comprehension Overall Auditory Comprehension: Impaired Yes/No Questions: Impaired Basic Biographical Questions: 0-25%  accurate Basic Immediate Environment Questions: 0-24% accurate Commands: Impaired One Step Basic Commands: 25-49% accurate    Expression Expression Primary Mode of Expression: Verbal Verbal Expression Overall Verbal Expression: Impaired Initiation: No impairment Level of Generative/Spontaneous Verbalization: Phrase Repetition: Impaired Level of Impairment: Word level Naming: Impairment Responsive: 0-25% accurate Confrontation: Impaired Verbal Errors: Not aware of errors;Neologisms   Oral / Motor  Oral Motor/Sensory Function Overall Oral Motor/Sensory Function: Within functional limits (difficulty following commands but grossly [facility]) Motor Speech Overall Motor Speech:   (appears WFL when clear words are spoken)   GO                   Maxcine Ham, M.A. CCC-SLP [phone]  Maxcine Ham 02/05/2016, 4:31 PM

## 2016-02-05 NOTE — Progress Notes (Signed)
Physical Therapy Evaluation Patient Details Name: [name] [name] MRN: [id number] DOB: 1936-11-07 Today's Date: 02/05/2016   History of Present Illness  80 y.o. female with a history of atrial fibrillation, Coumadin was stopped secondary to frequent falls TIA, COPD for which the patient has been enrolled in hospice services through hospice of [facility], CHF, pacemaker placement, diabetes mellitus, and dementia brought to the emergency room on 3-23 and code stroke status following an apparent change in mental status. Patient appeared to be somewhat less responsive and more confused than the baseline. No focal weakness was noted. CT scan of her head showed no acute intracranial abnormality. Patient has a history of recurrent urinary tract infections and is currently on antibiotic for UTI. Repeat CT scan of the brain showed left posterior parietal/occipital wedge shaped hypodensity compatible with an infarct. Patient has been seen and evaluated by neurology. She has ongoing confusion and aphasia.    Clinical Impression  Pt admitted with above diagnosis. Pt currently with functional limitations due to the deficits listed below (see PT Problem List). Pt very sleepy and lethargic and repeating "cover me up, I'm so cold". Pt became more alert once sitting on the bedside commode. Pt required Mod-Max A for all mobility and daughter states that the pt is usually able to help a lot more with transfers and that her current level is much worse than her baseline. Pt will benefit from skilled PT to increase their independence and safety with mobility to allow discharge to the venue listed below.  Pt would benefit from SNF rehab to improve independence with functional mobility before returning back to ALF.      Follow Up Recommendations SNF;Supervision/Assistance - 24 hour    Equipment Recommendations  Other (comment) (TBD at next level of care)    Recommendations for Other Services OT consult     Precautions /  Restrictions Precautions Precautions: Fall Restrictions Weight Bearing Restrictions: No      Mobility  Bed Mobility Overal bed mobility: Needs Assistance Bed Mobility: Rolling;Supine to Sit Rolling: Max assist   Supine to sit: Max assist     General bed mobility comments: Pt not really following commands, She inconsistently followed simple one step commands and needed maximum tactile cues for initiation.   Transfers Overall transfer level: Needs assistance Equipment used: 2 person hand held assist (held on to back of therapists arms.) Transfers: Sit to/from [employer] Sit to Stand: Max assist Stand pivot transfers: Max assist       General transfer comment: Pt held on to back of therapist's arms to stand and pivot to bedside commode. Cleaned pt up then stood a second time to stand pivot to the chair which, was improved from first time as pt was more alert.   Ambulation/Gait             General Gait Details: pt non ambulatory at baseline  Stairs            Wheelchair Mobility    Modified Rankin (Stroke Patients Only)       Balance Overall balance assessment: Needs assistance Sitting-balance support: Feet supported;Bilateral upper extremity supported Sitting balance-Leahy Scale: Poor Sitting balance - Comments: Needed Mod A to sit EOB, but improved once pt was more alert sitting on bedside commode.    Standing balance support: Bilateral upper extremity supported Standing balance-Leahy Scale: Zero Standing balance comment: reliant on assistance and UE support.  Pertinent Vitals/Pain Pain Assessment: No/denies pain  Vitals stable throughout activity.     Home Living Family/patient expects to be discharged to:: Skilled nursing facility                 Additional Comments: Pt came from assisted ling.     Prior Function Level of Independence: Needs assistance   Gait / Transfers  Assistance Needed: Pt non ambulatory, pt w/c bound at baseline. Needed Min-Mod A for transfers.   ADL's / Homemaking Assistance Needed: Needs help with dressing, bathing, toileting.   Comments: Pt's daughter stated that she needed help to transfer in/out of the w/c at baseline and has been uanble to walk for some time.      Hand Dominance   Dominant Hand: Right    Extremity/Trunk Assessment   Upper Extremity Assessment: Generalized weakness           Lower Extremity Assessment: Generalized weakness      Cervical / Trunk Assessment: Kyphotic  Communication   Communication: Expressive difficulties;HOH  Cognition Arousal/Alertness: Lethargic Behavior During Therapy: Flat affect Overall Cognitive Status: History of cognitive impairments - at baseline       Memory: Decreased short-term memory;Decreased recall of precautions              General Comments General comments (skin integrity, edema, etc.): Pt had a brief on which was soiled. Pt asking to go to bathroom so helped pt transfer to commode and cleaned her up and put a clean brief on. Pt sitting up feeding herself after session was complete.     Exercises        Assessment/Plan    PT Assessment Patient needs continued PT services  PT Diagnosis Difficulty walking;Generalized weakness;Acute pain;Altered mental status   PT Problem List Decreased strength;Decreased activity tolerance;Decreased balance;Decreased mobility;Decreased coordination;Decreased cognition;Decreased knowledge of use of DME;Decreased safety awareness;Cardiopulmonary status limiting activity  PT Treatment Interventions DME instruction;Functional mobility training;Therapeutic activities;Therapeutic exercise;Balance training;Patient/family education;Wheelchair mobility training   PT Goals (Current goals can be found in the Care Plan section) Acute Rehab PT Goals Patient Stated Goal: to get stronger and to improve mobility PT Goal Formulation:  Patient unable to participate in goal setting Time For Goal Achievement: 02/19/16 Potential to Achieve Goals: Fair    Frequency Min 2X/week   Barriers to discharge Decreased caregiver support Daughter states that she is not sure if the ALF could handle her current level of care.     Co-evaluation               End of Session Equipment Utilized During Treatment: Gait belt Activity Tolerance: Patient limited by lethargy Patient left: in chair;with call bell/phone within reach;with chair alarm set;with family/visitor present Nurse Communication: Mobility status         Time: [id number] PT Time Calculation (min) (ACUTE ONLY): 25 min   Charges:   PT Evaluation $PT Eval Moderate Complexity: 1 Procedure PT Treatments $Therapeutic Activity: 8-22 mins   PT G Codes:       Everlean CherryJenna Maven Varelas, SPT Everlean CherryJenna Samentha Perham 02/05/2016, 1:22 PM

## 2016-02-05 NOTE — Discharge Summary (Signed)
Physician Discharge [place] MRN: [id number] DOB/AGE: February 25, 1936 80 y.o.  PCP: No primary care provider on file.   Admit date: 02/01/2016 Discharge date: 02/05/2016  Discharge Diagnoses:    Principal Problem:   Altered mental status Active Problems:   Essential hypertension   Facial asymmetry   Dementia   Altered mental state   Cerebrovascular accident (CVA) due to embolism of left middle cerebral artery ([place])   Encounter for palliative care   Goals of care, counseling/discussion    Follow-up recommendations Follow-up with PCP in 3-5 days , including all  additional recommended appointments as below Follow-up CBC, CMP in 3-5 days Patient being discharged back to group to [facility] with continuation of hospice versus a brief attempt/trial of therapies with skilled nursing facility for rehabilitation     Medication List    STOP taking these medications        buPROPion 150 MG 12 hr tablet  Commonly known as:  WELLBUTRIN SR     cephALEXin 500 MG capsule  Commonly known as:  KEFLEX     diphenhydrAMINE 25 MG tablet  Commonly known as:  BENADRYL     HYDROcodone-acetaminophen 5-325 MG tablet  Commonly known as:  NORCO/VICODIN      TAKE these medications        acetaminophen 325 MG tablet  Commonly known as:  TYLENOL  Take 650 mg by mouth every 6 (six) hours as needed for mild pain or fever.     AZO CRANBERRY 250-30 MG Tabs  Take 1 tablet by mouth daily.     calcium carbonate 750 MG chewable tablet  Commonly known as:  TUMS EX  Chew 1 tablet by mouth every 6 (six) hours as needed for heartburn.     CHLORASEPTIC 6-10 MG lozenge  Generic drug:  benzocaine-menthol  Take 1 lozenge by mouth every 2 (two) hours as needed for sore throat.     clopidogrel 75 MG tablet  Commonly known as:  PLAVIX  Take 1 tablet (75 mg total) by mouth daily.     DIABETIC TUSSIN MAX ST 10-200 MG/5ML Liqd  Generic drug:  Dextromethorphan-Guaifenesin  Take 10 mLs by mouth  every 6 (six) hours as needed (for cough).     diltiazem 240 MG 24 hr capsule  Commonly known as:  CARDIZEM CD  Take 1 capsule (240 mg total) by mouth daily.     divalproex 125 MG capsule  Commonly known as:  DEPAKOTE SPRINKLE  Take 250 mg by mouth 2 (two) times daily.     docusate sodium 100 MG capsule  Commonly known as:  COLACE  Take 100 mg by mouth 2 (two) times daily.     fluticasone furoate-vilanterol 100-25 MCG/INH Aepb  Commonly known as:  BREO ELLIPTA  Inhale 1 puff into the lungs every morning.     losartan 50 MG tablet  Commonly known as:  COZAAR  TAKE 1 TABLET BY MOUTH EVERY DAY     metFORMIN 500 MG tablet  Commonly known as:  GLUCOPHAGE  Take 500 mg by mouth daily with breakfast.     mirtazapine 15 MG disintegrating tablet  Commonly known as:  REMERON SOL-TAB  Take 15 mg by mouth at bedtime.     morphine 20 MG/ML concentrated solution  Commonly known as:  ROXANOL  Take 0.5 mLs (10 mg total) by mouth every 4 (four) hours as needed for severe pain.     MULTIVITAMIN/IRON PO  Take 1 tablet by mouth daily.  oxybutynin 15 MG 24 hr tablet  Commonly known as:  DITROPAN XL  Take 15 mg by mouth every morning.     potassium chloride SA 20 MEQ tablet  Commonly known as:  K-DUR,KLOR-CON  Take 20 mEq by mouth daily. In the morning     prochlorperazine 10 MG tablet  Commonly known as:  COMPAZINE  Take 10 mg by mouth every 6 (six) hours as needed for nausea or vomiting.     senna 8.6 MG Tabs tablet  Commonly known as:  SENOKOT  Take 1 tablet by mouth 2 (two) times daily.     traZODone 50 MG tablet  Commonly known as:  DESYREL  Take 25 mg by mouth 3 (three) times daily.         Discharge Condition: Prognosis poor   Discharge Instructions Get Medicines reviewed and adjusted: Please take all your medications with you for your next visit with your Primary MD  Please request your Primary MD to go over all hospital tests and procedure/radiological results  at the follow up, please ask your Primary MD to get all Hospital records sent to his/her office.  If you experience worsening of your admission symptoms, develop shortness of breath, life threatening emergency, suicidal or homicidal thoughts you must seek medical attention immediately by calling 911 or calling your MD immediately if symptoms less severe.  You must read complete instructions/literature along with all the possible adverse reactions/side effects for all the Medicines you take and that have been prescribed to you. Take any new Medicines after you have completely understood and accpet all the possible adverse reactions/side effects.   Do not drive when taking Pain medications.   Do not take more than prescribed Pain, Sleep and Anxiety Medications  Special Instructions: If you have smoked or chewed Tobacco in the last 2 yrs please stop smoking, stop any regular Alcohol and or any Recreational drug use.  Wear Seat belts while driving.  Please note  You were cared for by a hospitalist during your hospital stay. Once you are discharged, your primary care physician will handle any further medical issues. Please note that NO REFILLS for any discharge medications will be authorized once you are discharged, as it is imperative that you return to your primary care physician (or establish a relationship with a primary care physician if you do not have one) for your aftercare needs so that they can reassess your need for medications and monitor your lab values.    Allergies  Allergen Reactions  . Aspirin Nausea And Vomiting  . Calan [Verapamil Hcl] Other (See Comments)    Patient states it makes her "out of her mind"; bp bottoms out (takes diltiazem at home)  . Crestor [Rosuvastatin Calcium] Other (See Comments)    Muscle pain  . Escitalopram Oxalate Other (See Comments)    Reaction Unknown   . Lipitor [Atorvastatin Calcium] Other (See Comments)    myalgia  . Lopressor [Metoprolol  Tartrate] Other (See Comments)    Blood pressure bottoms out   . Nitroglycerin Other (See Comments)    Reaction unknown  . Norvasc [Amlodipine Besylate] Other (See Comments)    fatigue  . Nsaids Nausea Only  . Oxycodone Other (See Comments)    Reaction unknown  . Prednisone Swelling  . Sulfa Antibiotics Nausea Only and Other (See Comments)    Makes her stomach hurt  . Vimovo [Naproxen-Esomeprazole] Nausea Only  . Penicillins Hives and Rash      Disposition: 01-Home or Self  Care   Consults:  Neurology Palliative care     Significant Diagnostic Studies:  Ct Head Wo Contrast  02/03/2016  ADDENDUM REPORT: 02/03/2016 04:30 ADDENDUM: These results were called by telephone at the time of interpretation on 02/03/2016 at 4:29 am to nurse practitioner Donnal Debar who verbally acknowledged these results. Electronically Signed   By: Anner Crete M.D.   On: 02/03/2016 04:30  02/03/2016  CLINICAL DATA:  80 year old female with altered mental status EXAM: CT HEAD WITHOUT CONTRAST TECHNIQUE: Contiguous axial images were obtained from the base of the skull through the vertex without intravenous contrast. COMPARISON:  CT dated 02/01/2016 FINDINGS: The ventricles are dilated and the sulci are prominent compatible with age-related atrophy. Periventricular and deep white matter hypodensities represent chronic microvascular ischemic changes. There is a wedge-shaped area of hypodensity in the left posterior parietal/ occipital lobe compatible with an infarct, possibly subacute. An acute infarct is not excluded. Clinical correlation is recommended. There is no intracranial hemorrhage. No mass effect or midline shift identified. The visualized paranasal sinuses and mastoid air cells are well aerated. The calvarium is intact. IMPRESSION: No acute intracranial hemorrhage. Left posterior parietal/occipital wedge-shaped hypodensity compatible with an infarct, likely subacute or less likely acute. Correlation with  clinical exam and further evaluation with MRI recommended. Age-related atrophy and chronic microvascular ischemic disease. Electronically Signed: By: Anner Crete M.D. On: 02/03/2016 04:16   Ct Head Wo Contrast  02/01/2016  CLINICAL DATA:  Code stroke.  Altered mental status. EXAM: CT HEAD WITHOUT CONTRAST TECHNIQUE: Contiguous axial images were obtained from the base of the skull through the vertex without intravenous contrast. COMPARISON:  10/04/2015 FINDINGS: The study is mildly motion degraded throughout. There is no evidence of acute large territory infarct, intracranial hemorrhage, mass, midline shift, or extra-axial fluid collection. Moderate cerebral atrophy is unchanged. Cerebral white matter hypodensities are unchanged and nonspecific but compatible with moderate chronic small vessel ischemic disease. Small, chronic infarcts are again seen in the region of the left putamen and in the right cerebellum. Orbits are unremarkable. The paranasal sinuses and mastoid air cells are clear. Calcified atherosclerosis is noted at the skullbase. Gas in the infratemporal fossae and right cavernous sinus is likely venous and may be related to recent venipuncture. IMPRESSION: 1. Motion degraded examination without acute intracranial abnormality identified. 2. Moderate chronic small vessel ischemic disease. These results were called by telephone at the time of interpretation on 02/01/2016 at 9:40 pm to Dr. Nicole Kindred, who verbally acknowledged these results. Electronically Signed   By: Logan Bores M.D.   On: 02/01/2016 21:42       Filed Weights   02/02/16 1434  Weight: 64.949 kg (143 lb 3 oz)     Microbiology: Recent Results (from the past 240 hour(s))  MRSA PCR Screening     Status: None   Collection Time: 02/02/16  2:50 AM  Result Value Ref Range Status   MRSA by PCR NEGATIVE NEGATIVE Final    Comment:        The GeneXpert MRSA Assay (FDA approved for NASAL specimens only), is one component of  a comprehensive MRSA colonization surveillance program. It is not intended to diagnose MRSA infection nor to guide or monitor treatment for MRSA infections.        Blood Culture    Component Value Date/Time   SDES URINE, RANDOM 11/02/2015 2041   SPECREQUEST NONE 11/02/2015 2041   CULT NO GROWTH 2 DAYS 11/02/2015 2041   REPTSTATUS 11/04/2015 FINAL 11/02/2015 2041  Labs: Results for orders placed or performed during the hospital encounter of 02/01/16 (from the past 48 hour(s))  Glucose, capillary     Status: Abnormal   Collection Time: 02/03/16 11:21 AM  Result Value Ref Range   Glucose-Capillary 134 (H) 65 - 99 mg/dL  Glucose, capillary     Status: None   Collection Time: 02/03/16  5:18 PM  Result Value Ref Range   Glucose-Capillary 86 65 - 99 mg/dL  Glucose, capillary     Status: Abnormal   Collection Time: 02/03/16 10:47 PM  Result Value Ref Range   Glucose-Capillary 110 (H) 65 - 99 mg/dL   Comment 1 Notify RN    Comment 2 Document in Chart   CBC     Status: None   Collection Time: 02/04/16  5:25 AM  Result Value Ref Range   WBC 5.6 4.0 - 10.5 K/uL   RBC 4.19 3.87 - 5.11 MIL/uL   Hemoglobin 13.7 12.0 - 15.0 g/dL   HCT 40.6 36.0 - 46.0 %   MCV 96.9 78.0 - 100.0 fL   MCH 32.7 26.0 - 34.0 pg   MCHC 33.7 30.0 - 36.0 g/dL   RDW 12.9 11.5 - 15.5 %   Platelets 186 150 - 400 K/uL  Comprehensive metabolic panel     Status: Abnormal   Collection Time: 02/04/16  5:25 AM  Result Value Ref Range   Sodium 138 135 - 145 mmol/L   Potassium 3.8 3.5 - 5.1 mmol/L   Chloride 105 101 - 111 mmol/L   CO2 22 22 - 32 mmol/L   Glucose, Bld 120 (H) 65 - 99 mg/dL   BUN 14 6 - 20 mg/dL   Creatinine, Ser 0.75 0.44 - 1.00 mg/dL   Calcium 9.6 8.9 - 10.3 mg/dL   Total Protein 6.9 6.5 - 8.1 g/dL   Albumin 3.5 3.5 - 5.0 g/dL   AST 29 15 - 41 U/L   ALT 12 (L) 14 - 54 U/L   Alkaline Phosphatase 65 38 - 126 U/L   Total Bilirubin 1.2 0.3 - 1.2 mg/dL   GFR calc non Af Amer >60 >60  mL/min   GFR calc Af Amer >60 >60 mL/min    Comment: (NOTE) The eGFR has been calculated using the CKD EPI equation. This calculation has not been validated in all clinical situations. eGFR's persistently <60 mL/min signify possible Chronic Kidney Disease.    Anion gap 11 5 - 15  Lipid panel     Status: Abnormal   Collection Time: 02/04/16  5:25 AM  Result Value Ref Range   Cholesterol 254 (H) 0 - 200 mg/dL   Triglycerides 187 (H) <150 mg/dL   HDL 45 >40 mg/dL   Total CHOL/HDL Ratio 5.6 RATIO   VLDL 37 0 - 40 mg/dL   LDL Cholesterol 172 (H) 0 - 99 mg/dL    Comment:        Total Cholesterol/HDL:CHD Risk Coronary Heart Disease Risk Table                     Men   Women  1/2 Average Risk   3.4   3.3  Average Risk       5.0   4.4  2 X Average Risk   9.6   7.1  3 X Average Risk  23.4   11.0        Use the calculated Patient Ratio above and the CHD Risk Table to determine the patient's CHD  Risk.        ATP III CLASSIFICATION (LDL):  <100     mg/dL   Optimal  100-129  mg/dL   Near or Above                    Optimal  130-159  mg/dL   Borderline  160-189  mg/dL   High  >190     mg/dL   Very High   Glucose, capillary     Status: Abnormal   Collection Time: 02/04/16  6:44 AM  Result Value Ref Range   Glucose-Capillary 104 (H) 65 - 99 mg/dL   Comment 1 Notify RN    Comment 2 Document in Chart   Glucose, capillary     Status: None   Collection Time: 02/04/16 11:05 AM  Result Value Ref Range   Glucose-Capillary 89 65 - 99 mg/dL  Glucose, capillary     Status: Abnormal   Collection Time: 02/04/16  4:09 PM  Result Value Ref Range   Glucose-Capillary 111 (H) 65 - 99 mg/dL  Glucose, capillary     Status: Abnormal   Collection Time: 02/05/16  6:26 AM  Result Value Ref Range   Glucose-Capillary 106 (H) 65 - 99 mg/dL   Comment 1 Notify RN    Comment 2 Document in Chart      Lipid Panel     Component Value Date/Time   CHOL 254* 02/04/2016 0525   TRIG 187* 02/04/2016 0525    HDL 45 02/04/2016 0525   CHOLHDL 5.6 02/04/2016 0525   VLDL 37 02/04/2016 0525   LDLCALC 172* 02/04/2016 0525     Lab Results  Component Value Date   HGBA1C 5.4 02/02/2016   HGBA1C 5.6 05/29/2014   HGBA1C 6.4* 11/18/2013     Lab Results  Component Value Date   LDLCALC 172* 02/04/2016   CREATININE 0.75 02/04/2016     HPI :*80 y.o. female with a history of atrial fibrillation, Coumadin was stopped secondary to frequent falls TIA, COPD for which the patient has been enrolled in hospice services through hospice of [facility], CHF, pacemaker placement, diabetes mellitus, and dementia brought to the emergency room on 3-23 and code stroke status following an apparent change in mental status. Patient appeared to be somewhat less responsive and more confused than the baseline. No focal weakness was noted. CT scan of her head showed no acute intracranial abnormality. Patient has a history of recurrent urinary tract infections and is currently on antibiotic for UTI. Repeat CT scan of the brain showed left posterior parietal/occipital wedge shaped hypodensity compatible with an infarct. Patient has been seen and evaluated by neurology. She has ongoing confusion and aphasia. Patient is unable to undergo MRI or MRA because of permanent pacemaker placement.  HOSPITAL COURSE:   Stroke: Dominant infarct probably embolic secondary to atrial fibrillation.  Resultant Confusion and aphasia  MRI permanent pacemaker  MRA permanent pacemaker  CT - Left posterior parietal/occipital wedge-shaped hypodensity compatible with an infarct.  Carotid Doppler - cancelled per discussion with Dr. Belenda Cruise who spoke with family - Hospice patient  2D Echo pending  LDL - 172  EEG - . Mild diffuse slowing of the waking background, Additional focal slowing over the left temporal region  HgbA1c 5.4  Diet regular Room service appropriate?: Yes; Fluid consistency:: Thin  No antithrombotic prior to  admission, now on clopidogrel 75 mg daily  Patient counseled to be compliant with her antithrombotic medications  Ongoing aggressive stroke risk factor  management  Therapy recommendations: SNF vs memory care unit    Hypertension  Occasional low blood pressure otherwise stable  Permissive hypertension (OK if < 220/120) but gradually normalize in 5-7 days, cont cardizem  Hyperlipidemia  Home meds: No lipid lowering medications prior to admission  LDL 172, triglycerides 187   Diabetes  HgbA1c 5.4, goal < 7.0  Controlled  Other Stroke Risk Factors  Advanced age  Cigarette smoker, quit smoking 10 years ago.  Obesity, Body mass index is 24.57 kg/(m^2).   Hx stroke/TIA  Other Active Problems  Renal insufficiency  Hospice patient for COPD    SUMMARY OF RECOMMENDATIONS: DO NOT RESUSCITATE/DO NOT INTUBATE Goals of care are palliative in nature Patient is enrolled in hospice of [facility] assisted living/memory care unit for COPD for the past several months now Disposition: Patient's 2 daughters are considering transfer back to [place] with continuation of hospice versus a brief attempt/trial of therapies with skilled nursing facility for rehabilitation towards the end of this hospitalization. They wish to maintain the patient's function for as long as possible.    Discharge Exam: Blood pressure 161/67, pulse 59, temperature 98.1 F (36.7 C), temperature source Axillary, resp. rate 18, height 5' 4"  (1.626 m), weight 64.949 kg (143 lb 3 oz), SpO2 100 %.  General: No acute respiratory distress Lungs: Clear to auscultation bilaterally without wheezes or crackles Cardiovascular: Regular rate and rhythm without murmur gallop or rub normal S1 and S2 Abdomen: Nontender, nondistended, soft, bowel sounds positive, no rebound, no ascites, no appreciable mass Extremities: No significant cyanosis, clubbing, or edema bilateral lower extremities         Follow-up Information    Follow up with Primary care provider. Schedule an appointment as soon as possible for a visit in 3 days.      SignedReyne Dumas 02/05/2016, 9:29 AM        Time spent >45 mins

## 2016-02-05 NOTE — Progress Notes (Signed)
Speech Language Pathology Treatment: Dysphagia  Patient Details Name: [name] [name] MRN: [id number] DOB: 11/22/1935 Today's Date: 02/05/2016 Time: [id number] SLP Time Calculation (min) (ACUTE ONLY): 8 min  Assessment / Plan / Recommendation Clinical Impression  With encouragement from SLP and niece, pt consumed trials of ice chips, thin liquids via straw, and graham crackers. No overt s/s of aspiration noted. RN reported that pt has been spitting out pills, which is likely attributable to cognitive status as opposed to swallowing deficit. Recommend to crush those that can be crushed and/or consider liquid form. Education about careful hand feeding provided to niece who reports she will be assisting with evening meal. Will continue to follow.   HPI HPI: 80 y.o. female with a history of atrial fibrillation, TIA, COPD, CHF, pacemaker placement, diabetes mellitus, and dementia brought to the emergency room and code stroke status following an apparent change in mental status. Patient appeared to be somewhat less responsive and more confused than baseline. No focal weakness was noted. CT scan of her head showed no acute intracranial abnormality. Patient has a history of recurrent urinary tract infections and is currently on antibiotic for UTI. Temperature was slightly low at 96.9. WBC count was normal. BUN and creatinine was slightly elevated. Urinalysis negative. Patient was not deemed a candidate for TPA. Acute stroke or TIA was felt to be unlikely and code stroke was canceled.       SLP Plan  Continue with current plan of care     Recommendations  Diet recommendations: Regular;Thin liquid Liquids provided via: Straw Medication Administration: Crushed with puree Supervision: Staff to assist with self feeding;Full supervision/cueing for compensatory strategies Compensations: Minimize environmental distractions;Slow rate;Small sips/bites Postural Changes and/or Swallow Maneuvers: Seated upright 90  degrees;Upright 30-60 min after meal             Oral Care Recommendations: Oral care BID Follow up Recommendations: Skilled Nursing facility Plan: Continue with current plan of care     GO               Maxcine HamLaura Paiewonsky, M.A. CCC-SLP [phone]  Maxcine Hamaiewonsky, Skyrah Krupp 02/05/2016, 4:17 PM

## 2016-02-05 NOTE — Progress Notes (Addendum)
STROKE TEAM PROGRESS NOTE   HISTORY OF PRESENT ILLNESS at admission [name] [name] is an 80 y.o. female with a history of atrial fibrillation, TIA, COPD, CHF, pacemaker placement, diabetes mellitus, and dementia brought to the emergency room and code stroke status following an apparent change in mental status. Patient appeared to be somewhat less responsive and more confused than the baseline. No focal weakness was noted. CT scan of her head showed no acute intracranial abnormality. Patient has a history of recurrent urinary tract infections and is currently on antibiotic for UTI. Temperature was slightly low at 96.9. WBC count was normal. BUN and creatinine was slightly elevated. Urinalysis results pending. Patient was not deemed a candidate for TPA. Acute stroke or TIA was felt to be unlikely and code stroke was canceled.   SUBJECTIVE (INTERVAL HISTORY) Her daughter is at the bedside. Per daughter, she has just signed papers from hospice services just prior to our arrival. Pt is sitting in recliner sleeping. When woke up by me, she was agitated and ask "just leave me alone".    OBJECTIVE Temp:  [97 F (36.1 C)-98.2 F (36.8 C)] 98.1 F (36.7 C) (03/27 0620) Pulse Rate:  [50-70] 59 (03/27 0620) Cardiac Rhythm:  [-] Ventricular paced;Bundle branch block;Heart block (03/27 0700) Resp:  [18] 18 (03/27 0620) BP: (108-179)/(48-91) 161/67 mmHg (03/27 0917) SpO2:  [96 %-100 %] 100 % (03/27 0827)  CBC:   Recent Labs Lab 02/01/16 2126 02/01/16 2137 02/04/16 0525  WBC 5.9  --  5.6  NEUTROABS 3.6  --   --   HGB 12.2 12.9 13.7  HCT 37.5 38.0 40.6  MCV 99.7  --  96.9  PLT 182  --  186    Basic Metabolic Panel:   Recent Labs Lab 02/01/16 2126 02/01/16 2137 02/04/16 0525  NA 139 139 138  K 4.3 4.2 3.8  CL 102 99* 105  CO2 29  --  22  GLUCOSE 150* 141* 120*  BUN 25* 27* 14  CREATININE 1.22* 1.20* 0.75  CALCIUM 9.4  --  9.6    Lipid Panel:     Component Value Date/Time   CHOL 254*  02/04/2016 0525   TRIG 187* 02/04/2016 0525   HDL 45 02/04/2016 0525   CHOLHDL 5.6 02/04/2016 0525   VLDL 37 02/04/2016 0525   LDLCALC 172* 02/04/2016 0525   HgbA1c:  Lab Results  Component Value Date   HGBA1C 5.4 02/02/2016   Urine Drug Screen:     Component Value Date/Time   LABOPIA NONE DETECTED 02/01/2016 2325   LABOPIA POSITIVE 02/07/2015 2047   COCAINSCRNUR NONE DETECTED 02/01/2016 2325   LABBENZ NONE DETECTED 02/01/2016 2325   LABBENZ POSITIVE 02/07/2015 2047   AMPHETMU NONE DETECTED 02/01/2016 2325   AMPHETMU NEGATIVE 02/07/2015 2047   THCU NONE DETECTED 02/01/2016 2325   THCU NEGATIVE 02/07/2015 2047   LABBARB NONE DETECTED 02/01/2016 2325   LABBARB NEGATIVE 02/07/2015 2047      IMAGING I have personally reviewed the radiological images below and agree with the radiology interpretations.  Ct Head Wo Contrast 02/03/2016   No acute intracranial hemorrhage. Left posterior parietal/occipital wedge-shaped hypodensity compatible with an infarct, likely subacute or less likely acute. Correlation with clinical exam and further evaluation with MRI recommended. Age-related atrophy and chronic microvascular ischemic disease.   Ct Head Wo Contrast 02/01/2016   1. Motion degraded examination without acute intracranial abnormality identified.  2. Moderate chronic small vessel ischemic disease.   EEG 02/02/2016 This awake EEG is  abnormal due to the presence of: 1. Mild diffuse slowing of the waking background 2. Additional focal slowing over the left temporal region Clinical Correlation of the above findings indicates bilateral cerebral dysfunction that is non-specific in etiology and can be seen with hypoxic/ischemic injury, toxic/metabolic encephalopathies, neurodegenerative disorders, or medication effect. Additional focal slowing over the left temporal region indicates focal cerebral dysfunction in this region suggestive of underlying structural or physiologic abnormality.  The absence of epileptiform discharges does not rule out a clinical diagnosis of epilepsy. Clinical correlation is advised.  2D Echocardiogram  - Left ventricle: Posterior basal hypokinesis. Wall thickness was increased in a pattern of severe LVH. Systolic function was normal. The estimated ejection fraction was in the range of 50% to 55%. - Aortic valve: There was mild regurgitation. - Mitral valve: There was mild regurgitation. - Left atrium: The atrium was moderately dilated.  CUS - cancelled as per family wishes as no surgery even if stenosis found   PHYSICAL EXAM Physical Exam General - Well nourished, well developed, sleeping in recliner Cardiovascular - Regular rate and rhythm Pulmonary: CTA Abdomen: NT, ND, normal bowel sounds Extremities: No C/C/E  Neurological Exam Mental Status: opens eyes on voice and able to know her name and age, but then agitated "just leave me alone". Refused further testing. PERRL, OCR, face grossly symmetric, weak cough  Motor:  Refused to move on command, not cooperative on exam  Sensory not cooperative  Coordination/Gait Could not be tested  ASSESSMENT/PLAN Ms. Ruey [name] [name] is a 80 y.o. female with history of atrial fibrillation (Coumadin stopped secondary to frequent falls in the past), TIA, COPD, CHF, pacemaker placement, diabetes mellitus, and dementia presenting with altered mental status. She did not receive IV t-PA due to low probability for stroke.  Stroke:  Dominant left posterior parietal/occipital infarct embolic secondary to known atrial fibrillation not on AC.  Resultant  Confusion and aphasia  CT - Left posterior parietal/occipital wedge-shaped hypodensity compatible with an infarct.  Carotid Doppler - cancelled after discussion by Dr. Earl Lites with family   2D Echo no source of embolus  EEG - no seizure  LDL - 172  HgbA1c 5.4  VTE prophylaxis - Lovenox Diet regular Room service appropriate?: Yes; Fluid consistency::  Thin  No antithrombotic prior to admission, now on clopidogrel 75 mg daily. Had long discussion with daughter at bedside regarding plavix vs. Anticoagulation. We recommend eliquis for stroke prevention, but daughter would like to discuss with other family members.   Daughter counseled patient to be compliant with her antithrombotic medications  Ongoing aggressive stroke risk factor management  Therapy recommendations: SNF  Disposition: Pending (admitted from [facility] ALF/memory care unit) - plan to d/t to [facility] at SNF level for attempts with therapy   Atrial Fibrillation  Home anticoagulation:  No antithrombotic   Now on plavix  Previous on coumadin but d/c due to frequent falls  Now lives in SNF, basically wheelchair bound, currently low risk of fall  Discussed anticoagulation with daughter. For aggressive prevention, anticoagulation is recommended as pt is no longer a fall risk. Feel pt may be at even less risk now. Daughter to discuss with siblings, family related anticoagulation treatment.    Once anticoagulation starts, may d/c plavix   Hypertension  Occasional low blood pressure otherwise stable  Permissive hypertension (OK if < 220/120) but gradually normalize in 5-7 days  Hyperlipidemia  Home meds:  No lipid lowering medications PTA  LDL 172 goal < 70  Intolerant to statins in  the past given concern for muscle weakness on statin. [facility]   Given family's penchant to "do all we can", in setting of possible prior statin intolerance, will add pravastatin 40 mg daily   Diabetes  HgbA1c 5.4, at goal < 7.0  Controlled  Other Stroke Risk Factors  Advanced age  Cigarette smoker, quit smoking 10 years ago.  Hx stroke/TIA  Other Active Problems  Baseline dementia  Renal insufficiency  Hospice patient for COPD  Cervical stenosis  Recurrent UTIs  Hospital day # 2  Neurology will sign off. Please call with questions. Pt [facility]  [facility]. Thanks for the consult.  Marvel PlanJindong Praneel Haisley, MD PhD Stroke Neurology 02/05/2016 10:54 PM    To contact Stroke Continuity provider, please refer to [email or web address]. After hours, contact General Neurology

## 2016-02-06 ENCOUNTER — Encounter
Admission: RE | Admit: 2016-02-06 | Discharge: 2016-02-06 | Disposition: A | Discharge status: Home / Self Care | Source: Ambulatory Visit | Attending: Internal Medicine | Admitting: Internal Medicine

## 2016-02-06 DIAGNOSIS — F0391 Unspecified dementia with behavioral disturbance: Secondary | ICD-10-CM

## 2016-02-06 LAB — GLUCOSE, CAPILLARY
Glucose-Capillary: 112 mg/dL — ABNORMAL HIGH (ref 65–99)
Glucose-Capillary: 102 mg/dL — ABNORMAL HIGH (ref 65–99)
Glucose-Capillary: 119 mg/dL — ABNORMAL HIGH (ref 65–99)

## 2016-02-06 MED ORDER — PRAVASTATIN SODIUM 40 MG PO TABS: 40.0000 mg | ORAL_TABLET | Freq: Every day | ORAL | Status: DC

## 2016-02-06 MED ORDER — MOMETASONE FURO-FORMOTEROL FUM 200-5 MCG/ACT IN AERO: 2.0000 | INHALATION_SPRAY | Freq: Two times a day (BID) | RESPIRATORY_TRACT | Status: DC

## 2016-02-06 NOTE — Care Management Note (Signed)
Case Management Note  Patient Details  Name: Morey HummingbirdMae L Dooling MRN: [id number] Date of Birth: 06/17/1936  Subjective/Objective:                    Action/Plan: Patient discharging to [facility] today. No further needs per CM.   Expected Discharge Date:                  Expected Discharge Plan:  Assisted Living / Rest Home  In-House Referral:  Clinical Social Work  Discharge planning Services     Post Acute Care Choice:    Choice offered to:     DME Arranged:    DME Agency:     HH Arranged:    HH Agency:     Status of Service:  In process, will continue to follow  Medicare Important Message Given:    Date Medicare IM Given:    Medicare IM give by:    Date Additional Medicare IM Given:    Additional Medicare Important Message give by:     If discussed at Long Length of Stay Meetings, dates discussed:    Additional Comments:  Kermit BaloKelli F Cozy Veale, RN 02/06/2016, 12:09 PM

## 2016-02-06 NOTE — Clinical Social Work Placement (Cosign Needed)
   CLINICAL SOCIAL WORK PLACEMENT  NOTE  Date:  02/06/2016  Patient Details  Name: [name] [name] MRN: [id number] Date of Birth: 01/19/1936  Clinical Social Work is seeking post-discharge placement for this patient at the Skilled  Nursing Facility level of care (*CSW will initial, date and re-position this form in  chart as items are completed):  Yes   Patient/family provided with [facility] Clinical Social Work Department's list of facilities offering this level of care within the geographic area requested by the patient (or if unable, by the patient's family).  Yes   Patient/family informed of their freedom to choose among providers that offer the needed level of care, that participate in Medicare, Medicaid or managed care program needed by the patient, have an available bed and are willing to accept the patient.  Yes   Patient/family informed of [facility]'s ownership interest [facility] and [facility], as well as of the fact that they are under no obligation to receive care at these facilities.  PASRR submitted to EDS on       PASRR number received on       Existing PASRR number confirmed on       FL2 transmitted to all facilities in geographic area requested by pt/family on       FL2 transmitted to all facilities within larger geographic area on       Patient informed that his/her managed care company has contracts with or will negotiate with certain facilities, including the following:        Yes   Patient/family informed of bed offers received.  Patient chooses bed at       Physician recommends and patient chooses bed at      Patient to be transferred to  [facility] on 02/06/16.  Patient to be transferred to facility by  Sharin Mons([facility])     Patient family notified on 02/06/16 of transfer.  Name of family member notified:   [name] [name]([name] [name] - daughter)     PHYSICIAN       Additional Comment:    _______________________________________________ Orson Gearatiana  Zebediah Beezley, Student-SW 02/06/2016, 11:24 AM

## 2016-02-06 NOTE — Discharge Summary (Signed)
Physician Discharge Summary  [name] [name] MRN: [id number] DOB/AGE: 1936/04/06 80 y.o.  PCP: No primary care provider on file.   Admit date: 02/01/2016 Discharge date: 02/06/2016  Discharge Diagnoses:    Principal Problem:   Altered mental status Active Problems:   Essential hypertension   Facial asymmetry   Dementia   Altered mental state   Cerebrovascular accident (CVA) due to embolism of left middle cerebral artery (HCC)   Encounter for palliative care   Goals of care, counseling/discussion   Paroxysmal atrial fibrillation (HCC)    Follow-up recommendations Follow-up with PCP in 3-5 days , including all  additional recommended appointments as below Follow-up CBC, CMP in 3-5 days Patient being discharged back to group to [facility] with continuation of hospice versus a brief attempt/trial of therapies with skilled nursing facility for rehabilitation Patient would need to be followed by speech therapy for continued speech-language pathology services  Diet recommendations: Regular;Thin liquid    Medication List    STOP taking these medications        buPROPion 150 MG 12 hr tablet  Commonly known as:  WELLBUTRIN SR     cephALEXin 500 MG capsule  Commonly known as:  KEFLEX     diphenhydrAMINE 25 MG tablet  Commonly known as:  BENADRYL     fluticasone furoate-vilanterol 100-25 MCG/INH Aepb  Commonly known as:  BREO ELLIPTA     HYDROcodone-acetaminophen 5-325 MG tablet  Commonly known as:  NORCO/VICODIN      TAKE these medications        acetaminophen 325 MG tablet  Commonly known as:  TYLENOL  Take 650 mg by mouth every 6 (six) hours as needed for mild pain or fever.     AZO CRANBERRY 250-30 MG Tabs  Take 1 tablet by mouth daily.     calcium carbonate 750 MG chewable tablet  Commonly known as:  TUMS EX  Chew 1 tablet by mouth every 6 (six) hours as needed for heartburn.     CHLORASEPTIC 6-10 MG lozenge  Generic drug:  benzocaine-menthol  Take 1 lozenge by  mouth every 2 (two) hours as needed for sore throat.     clopidogrel 75 MG tablet  Commonly known as:  PLAVIX  Take 1 tablet (75 mg total) by mouth daily.     DIABETIC TUSSIN MAX ST 10-200 MG/5ML Liqd  Generic drug:  Dextromethorphan-Guaifenesin  Take 10 mLs by mouth every 6 (six) hours as needed (for cough).     diltiazem 240 MG 24 hr capsule  Commonly known as:  CARDIZEM CD  Take 1 capsule (240 mg total) by mouth daily.     divalproex 125 MG capsule  Commonly known as:  DEPAKOTE SPRINKLE  Take 250 mg by mouth 2 (two) times daily.     docusate sodium 100 MG capsule  Commonly known as:  COLACE  Take 100 mg by mouth 2 (two) times daily.     losartan 50 MG tablet  Commonly known as:  COZAAR  TAKE 1 TABLET BY MOUTH EVERY DAY     metFORMIN 500 MG tablet  Commonly known as:  GLUCOPHAGE  Take 500 mg by mouth daily with breakfast.     mirtazapine 15 MG disintegrating tablet  Commonly known as:  REMERON SOL-TAB  Take 15 mg by mouth at bedtime.     mometasone-formoterol 200-5 MCG/ACT Aero  Commonly known as:  DULERA  Inhale 2 puffs into the lungs 2 (two) times daily.     morphine 20  MG/ML concentrated solution  Commonly known as:  ROXANOL  Take 0.5 mLs (10 mg total) by mouth every 4 (four) hours as needed for severe pain.     MULTIVITAMIN/IRON PO  Take 1 tablet by mouth daily.     oxybutynin 15 MG 24 hr tablet  Commonly known as:  DITROPAN XL  Take 15 mg by mouth every morning.     potassium chloride SA 20 MEQ tablet  Commonly known as:  K-DUR,KLOR-CON  Take 20 mEq by mouth daily. In the morning     pravastatin 40 MG tablet  Commonly known as:  PRAVACHOL  Take 1 tablet (40 mg total) by mouth daily at 6 PM.     prochlorperazine 10 MG tablet  Commonly known as:  COMPAZINE  Take 10 mg by mouth every 6 (six) hours as needed for nausea or vomiting.     senna 8.6 MG Tabs tablet  Commonly known as:  SENOKOT  Take 1 tablet by mouth 2 (two) times daily.     traZODone 50  MG tablet  Commonly known as:  DESYREL  Take 25 mg by mouth 3 (three) times daily.         Discharge Condition: Prognosis poor   Discharge Instructions Get Medicines reviewed and adjusted: Please take all your medications with you for your next visit with your Primary MD  Please request your Primary MD to go over all hospital tests and procedure/radiological results at the follow up, please ask your Primary MD to get all Hospital records sent to his/her office.  If you experience worsening of your admission symptoms, develop shortness of breath, life threatening emergency, suicidal or homicidal thoughts you must seek medical attention immediately by calling 911 or calling your MD immediately if symptoms less severe.  You must read complete instructions/literature along with all the possible adverse reactions/side effects for all the Medicines you take and that have been prescribed to you. Take any new Medicines after you have completely understood and accpet all the possible adverse reactions/side effects.   Do not drive when taking Pain medications.   Do not take more than prescribed Pain, Sleep and Anxiety Medications  Special Instructions: If you have smoked or chewed Tobacco in the last 2 yrs please stop smoking, stop any regular Alcohol and or any Recreational drug use.  Wear Seat belts while driving.  Please note  You were cared for by a hospitalist during your hospital stay. Once you are discharged, your primary care physician will handle any further medical issues. Please note that NO REFILLS for any discharge medications will be authorized once you are discharged, as it is imperative that you return to your primary care physician (or establish a relationship with a primary care physician if you do not have one) for your aftercare needs so that they can reassess your need for medications and monitor your lab values.  Discharge Instructions    Ambulatory referral to  Neurology    Complete by:  As directed   Follow up with carolyn Daphine Deutscher NP at [facility] in 2 months. thanks     Diet - low sodium heart healthy    Complete by:  As directed      Diet - low sodium heart healthy    Complete by:  As directed      Increase activity slowly    Complete by:  As directed      Increase activity slowly    Complete by:  As directed  Allergies  Allergen Reactions  . Aspirin Nausea And Vomiting  . Calan [Verapamil Hcl] Other (See Comments)    Patient states it makes her "out of her mind"; bp bottoms out (takes diltiazem at home)  . Crestor [Rosuvastatin Calcium] Other (See Comments)    Muscle pain  . Escitalopram Oxalate Other (See Comments)    Reaction Unknown   . Lipitor [Atorvastatin Calcium] Other (See Comments)    myalgia  . Lopressor [Metoprolol Tartrate] Other (See Comments)    Blood pressure bottoms out   . Nitroglycerin Other (See Comments)    Reaction unknown  . Norvasc [Amlodipine Besylate] Other (See Comments)    fatigue  . Nsaids Nausea Only  . Oxycodone Other (See Comments)    Reaction unknown  . Prednisone Swelling  . Sulfa Antibiotics Nausea Only and Other (See Comments)    Makes her stomach hurt  . Vimovo [Naproxen-Esomeprazole] Nausea Only  . Penicillins Hives and Rash      Disposition: 01-Home or Self Care   Consults:  Neurology Palliative care     Significant Diagnostic Studies:  Ct Head Wo Contrast  02/03/2016  ADDENDUM REPORT: 02/03/2016 04:30 ADDENDUM: These results were called by telephone at the time of interpretation on 02/03/2016 at 4:29 am to nurse practitioner Burnadette Peter who verbally acknowledged these results. Electronically Signed   By: Elgie Collard M.D.   On: 02/03/2016 04:30  02/03/2016  CLINICAL DATA:  80 year old female with altered mental status EXAM: CT HEAD WITHOUT CONTRAST TECHNIQUE: Contiguous axial images were obtained from the base of the skull through the vertex without intravenous contrast.  COMPARISON:  CT dated 02/01/2016 FINDINGS: The ventricles are dilated and the sulci are prominent compatible with age-related atrophy. Periventricular and deep white matter hypodensities represent chronic microvascular ischemic changes. There is a wedge-shaped area of hypodensity in the left posterior parietal/ occipital lobe compatible with an infarct, possibly subacute. An acute infarct is not excluded. Clinical correlation is recommended. There is no intracranial hemorrhage. No mass effect or midline shift identified. The visualized paranasal sinuses and mastoid air cells are well aerated. The calvarium is intact. IMPRESSION: No acute intracranial hemorrhage. Left posterior parietal/occipital wedge-shaped hypodensity compatible with an infarct, likely subacute or less likely acute. Correlation with clinical exam and further evaluation with MRI recommended. Age-related atrophy and chronic microvascular ischemic disease. Electronically Signed: By: Elgie Collard M.D. On: 02/03/2016 04:16   Ct Head Wo Contrast  02/01/2016  CLINICAL DATA:  Code stroke.  Altered mental status. EXAM: CT HEAD WITHOUT CONTRAST TECHNIQUE: Contiguous axial images were obtained from the base of the skull through the vertex without intravenous contrast. COMPARISON:  10/04/2015 FINDINGS: The study is mildly motion degraded throughout. There is no evidence of acute large territory infarct, intracranial hemorrhage, mass, midline shift, or extra-axial fluid collection. Moderate cerebral atrophy is unchanged. Cerebral white matter hypodensities are unchanged and nonspecific but compatible with moderate chronic small vessel ischemic disease. Small, chronic infarcts are again seen in the region of the left putamen and in the right cerebellum. Orbits are unremarkable. The paranasal sinuses and mastoid air cells are clear. Calcified atherosclerosis is noted at the skullbase. Gas in the infratemporal fossae and right cavernous sinus is likely  venous and may be related to recent venipuncture. IMPRESSION: 1. Motion degraded examination without acute intracranial abnormality identified. 2. Moderate chronic small vessel ischemic disease. These results were called by telephone at the time of interpretation on 02/01/2016 at 9:40 pm to Dr. Roseanne Reno, who verbally acknowledged these results. Electronically  Signed   By: [name] Ache M.D.   On: 02/01/2016 21:42       Filed Weights   02/02/16 1434  Weight: 64.949 kg (143 lb 3 oz)     Microbiology: Recent Results (from the past 240 hour(s))  MRSA PCR Screening     Status: None   Collection Time: 02/02/16  2:50 AM  Result Value Ref Range Status   MRSA by PCR NEGATIVE NEGATIVE Final    Comment:        The GeneXpert MRSA Assay (FDA approved for NASAL specimens only), is one component of a comprehensive MRSA colonization surveillance program. It is not intended to diagnose MRSA infection nor to guide or monitor treatment for MRSA infections.        Blood Culture    Component Value Date/Time   SDES URINE, RANDOM 11/02/2015 2041   SPECREQUEST NONE 11/02/2015 2041   CULT NO GROWTH 2 DAYS 11/02/2015 2041   REPTSTATUS 11/04/2015 FINAL 11/02/2015 2041      Labs: Results for orders placed or performed during the hospital encounter of 02/01/16 (from the past 48 hour(s))  Glucose, capillary     Status: None   Collection Time: 02/04/16 11:05 AM  Result Value Ref Range   Glucose-Capillary 89 65 - 99 mg/dL  Glucose, capillary     Status: Abnormal   Collection Time: 02/04/16  4:09 PM  Result Value Ref Range   Glucose-Capillary 111 (H) 65 - 99 mg/dL  Glucose, capillary     Status: Abnormal   Collection Time: 02/04/16 11:15 PM  Result Value Ref Range   Glucose-Capillary 109 (H) 65 - 99 mg/dL   Comment 1 Notify RN    Comment 2 Document in Chart   Glucose, capillary     Status: Abnormal   Collection Time: 02/05/16  6:26 AM  Result Value Ref Range   Glucose-Capillary 106 (H) 65 -  99 mg/dL   Comment 1 Notify RN    Comment 2 Document in Chart   Glucose, capillary     Status: Abnormal   Collection Time: 02/05/16 11:06 AM  Result Value Ref Range   Glucose-Capillary 109 (H) 65 - 99 mg/dL  Glucose, capillary     Status: Abnormal   Collection Time: 02/05/16  4:20 PM  Result Value Ref Range   Glucose-Capillary 113 (H) 65 - 99 mg/dL  Glucose, capillary     Status: None   Collection Time: 02/05/16 10:02 PM  Result Value Ref Range   Glucose-Capillary 87 65 - 99 mg/dL   Comment 1 Notify RN    Comment 2 Document in Chart   Glucose, capillary     Status: Abnormal   Collection Time: 02/06/16  6:36 AM  Result Value Ref Range   Glucose-Capillary 112 (H) 65 - 99 mg/dL     Lipid Panel     Component Value Date/Time   CHOL 254* 02/04/2016 0525   TRIG 187* 02/04/2016 0525   HDL 45 02/04/2016 0525   CHOLHDL 5.6 02/04/2016 0525   VLDL 37 02/04/2016 0525   LDLCALC 172* 02/04/2016 0525     Lab Results  Component Value Date   HGBA1C 5.4 02/02/2016   HGBA1C 5.6 05/29/2014   HGBA1C 6.4* 11/18/2013     Lab Results  Component Value Date   LDLCALC 172* 02/04/2016   CREATININE 0.75 02/04/2016     HPI :*80 y.o. female with a history of atrial fibrillation, Coumadin was stopped secondary to frequent falls TIA, COPD for which the  patient has been enrolled in hospice services [facility], CHF, pacemaker placement, diabetes mellitus, and dementia brought to the emergency room on 3-23 and code stroke status following an apparent change in mental status. Patient appeared to be somewhat less responsive and more confused than the baseline. No focal weakness was noted. CT scan of her head showed no acute intracranial abnormality. Patient has a history of recurrent urinary tract infections and is currently on antibiotic for UTI. Repeat CT scan of the brain showed left posterior parietal/occipital wedge shaped hypodensity compatible with an infarct. Patient has  been seen and evaluated by neurology. She has ongoing confusion and aphasia. Patient is unable to undergo MRI or MRA because of permanent pacemaker placement.  HOSPITAL COURSE:   Stroke: Dominant infarct probably embolic secondary to atrial fibrillation.  Resultant Confusion and aphasia  MRI permanent pacemaker  MRA permanent pacemaker  CT - Left posterior parietal/occipital wedge-shaped hypodensity compatible with an infarct.  Carotid Doppler - cancelled per discussion with Dr. Earl LitesGregory who spoke with family - Hospice patient  2D Echo pending  LDL - 172  EEG - . Mild diffuse slowing of the waking background, Additional focal slowing over the left temporal region  HgbA1c 5.4  Diet regular Room service appropriate?: Yes; Fluid consistency:: Thin  No antithrombotic prior to admission, now on clopidogrel 75 mg daily. Had long discussion with daughter at bedside regarding plavix vs. Anticoagulation. We recommend eliquis for stroke prevention, but daughter would like to discuss with other family members.  Ongoing aggressive stroke risk factor management  Therapy recommendations: SNF vs memory care unit   Atrial Fibrillation  Home anticoagulation: No antithrombotic   Now on plavix  Previous on coumadin but d/c due to frequent falls  Now lives in SNF, basically wheelchair bound, currently low risk of fall  Discussed anticoagulation with daughter. For aggressive prevention, anticoagulation is recommended as pt is no longer a fall risk. Feel pt may be at even less risk now. Daughter to discuss with siblings, family related anticoagulation treatment.   Once anticoagulation starts, may d/c plavix and start Eliquis 5 mg BID    Hypertension  Occasional low blood pressure otherwise stable  Permissive hypertension (OK if < 220/120) but gradually normalize in 5-7 days, cont cardizem  Hyperlipidemia  Home meds: No lipid lowering medications prior to  admission  LDL 172, triglycerides 187   Diabetes  HgbA1c 5.4, goal < 7.0  Controlled  Other Stroke Risk Factors  Advanced age  Cigarette smoker, quit smoking 10 years ago.  Obesity, Body mass index is 24.57 kg/(m^2).   Hx stroke/TIA  Other Active Problems  Renal insufficiency  Hospice patient for COPD    SUMMARY OF RECOMMENDATIONS: DO NOT RESUSCITATE/DO NOT INTUBATE Goals of care are palliative in nature Patient is [facility] in [facility] living/memory care unit for COPD for the past several months now Disposition: Patient's 2 daughters are considering transfer back to [facility] with continuation of hospice versus a brief attempt/trial of therapies with skilled nursing facility for rehabilitation towards the end of this hospitalization. They wish to maintain the patient's function for as long as possible.    Discharge Exam: Blood pressure 153/75, pulse 60, temperature 98 F (36.7 C), temperature source Axillary, resp. rate 16, height 5\' 4"  (1.626 m), weight 64.949 kg (143 lb 3 oz), SpO2 97 %.  General: No acute respiratory distress Lungs: Clear to auscultation bilaterally without wheezes or crackles Cardiovascular: Regular rate and rhythm without  murmur gallop or rub normal S1 and S2 Abdomen: Nontender, nondistended, soft, bowel sounds positive, no rebound, no ascites, no appreciable mass Extremities: No significant cyanosis, clubbing, or edema bilateral lower extremities    Follow-up Information    Follow up with Primary care provider. Schedule an appointment as soon as possible for a visit in 3 days.      Follow up with Nilda Riggs, NP. Schedule an appointment as soon as possible for a visit in 2 months.   Specialty:  Family Medicine   Why:  stroke clinic   Contact information:   [address] [place] [address] [phone]       Signed: Richarda Overlie 02/06/2016, 8:38 AM        Time  spent >45 mins

## 2016-02-06 NOTE — Clinical Social Work Note (Cosign Needed)
BSW intern to arrange transport via [facility]. Patient's daughter, Roanna RaiderSherri has been made aware. BSW intern to contact patient's bedside nurse as well.  BSW intern is signing off. If any further Social Work needs arises, please re-consult.   Orson Gearatiana Meagon Duskin- BSW intern [phone]

## 2016-02-06 NOTE — Clinical Social Work Placement (Cosign Needed)
   CLINICAL SOCIAL WORK PLACEMENT  NOTE  Date:  02/06/2016  Patient Details  Name: [name] [name] MRN: [id number] Date of Birth: 11/02/1936  Clinical Social Work is seeking post-discharge placement for this patient at the Skilled  Nursing Facility level of care (*CSW will initial, date and re-position this form in  chart as items are completed):  Yes   Patient/family provided with [facility] Clinical Social Work Department's list of facilities offering this level of care within the geographic area requested by the patient (or if unable, by the patient's family).  Yes   Patient/family informed of their freedom to choose among providers that offer the needed level of care, that participate in Medicare, Medicaid or managed care program needed by the patient, have an available bed and are willing to accept the patient.  Yes   Patient/family informed of [facility]'s ownership interest in [facility] and [facility], as well as of the fact that they are under no obligation to receive care at these facilities.  PASRR submitted to EDS on       PASRR number received on       Existing PASRR number confirmed on       FL2 transmitted to all facilities in geographic area requested by pt/family on       FL2 transmitted to all facilities within larger geographic area on       Patient informed that his/her managed care company has contracts with or will negotiate with certain facilities, including the following:            Patient/family informed of bed offers received.  Patient chooses bed at       Physician recommends and patient chooses bed at      Patient to be transferred to   on  .  Patient to be transferred to facility by       Patient family notified on   of transfer.  Name of family member notified:        PHYSICIAN Please sign DNR, Please sign FL2     Additional Comment:    _______________________________________________ Orson Gearatiana Elyanah Farino, Student-SW 02/06/2016, 10:34  AM

## 2016-02-06 NOTE — [place] FL2 (Signed)
[place] MEDICAID FL2 LEVEL OF CARE SCREENING TOOL     IDENTIFICATION  Patient Name: [name] [name] Birthdate: Jun 12, 1936 Sex: female Admission Date (Current Location): 02/01/2016  [place] and [place] Number:  [address] [id number] (AARP/AARP- [phone]) Facility and Address:  [facility], [address] [place], [place] [address]      Provider Number: [id number]  Attending Physician Name and Address:  Richarda Overlie, MD  Relative Name and Phone Number:   Eulis Foster - daughter, [phone])    Current Level of Care: Hospital Recommended Level of Care: Skilled Nursing Facility Prior Approval Number:    Date Approved/Denied:   PASRR Number:    Discharge Plan: SNF    Current Diagnoses: Patient Active Problem List   Diagnosis Date Noted  . Paroxysmal atrial fibrillation (HCC)   . Encounter for palliative care   . Goals of care, counseling/discussion   . Altered mental state   . Cerebrovascular accident (CVA) due to embolism of left middle cerebral artery (HCC)   . Dementia   . Facial asymmetry 02/01/2016  . Altered mental status 02/01/2016  . Closed head injury 05/30/2014  . Pulmonary hypertension, moderate to severe (HCC) 05/30/2014  . DM (diabetes mellitus), type 2 with neurological complications (HCC) 05/30/2014  . Hypoglycemia 05/29/2014  . Hypokalemia 05/29/2014  . Subdural hematoma caused by concussion (HCC) 05/28/2014  . hemorrhagic cerebral contusion 05/28/2014  . Dyslipidemia 03/07/2014  . Chronic respiratory failure (HCC) 01/01/2014  . Chest pain   . UTI (urinary tract infection) 11/19/2013  . Compression fracture of L4 lumbar vertebra (HCC) 11/18/2013  . Syncope 11/18/2013  . Protein-calorie malnutrition, severe (HCC) 11/18/2013  . Syncope and collapse 11/17/2013  . Closed compression fracture of L4 lumbar vertebra (HCC) 11/17/2013  . Gait difficulty 10/05/2013  . Fall (on)(from) incline, initial encounter 10/03/2013  .  Essential hypertension, benign 10/03/2013  . Unsteady gait 10/03/2013  . Peripheral neuropathy (HCC) 10/03/2013  . Back pain 08/06/2013  . Near syncope 08/06/2013  . Pulmonary hypertension (HCC) 08/05/2013  . Abnormality of gait 05/24/2013  . Acute respiratory distress (HCC) 05/08/2013  . Pulmonary edema, acute (HCC) 05/08/2013  . Benign hypertensive heart disease without heart failure   . Pulmonary HTN (HCC)   . Oxygen dependent   . COPD (chronic obstructive pulmonary disease) (HCC)   . Fatigue due to cardiopulmonary and deconditioning 04/23/2013  . Memory deficit 02/07/2013  . Memory change 12/11/2012  . TIA (transient ischemic attack) 02/19/2012  . Chest pain at rest 08/05/2011  . Type II or unspecified type diabetes mellitus without mention of complication, uncontrolled 06/12/2011  . Fall 03/22/2011  . Mitral regurgitation 03/13/2011  . Hypertrophic cardiomyopathy (HCC)   . Osteoarthritis   . Situational mixed anxiety and depressive disorder   . Pacemaker 02/26/2011  . Atrial fibrillation (HCC) 01/28/2011  . CHRONIC OBSTRUCTIVE PULMONARY DISEASE, MODERATE 05/23/2010  . DEPRESSIVE DISORDER NOT ELSEWHERE CLASSIFIED 04/11/2010  . HYPERLIPIDEMIA 05/31/2009  . Essential hypertension 05/31/2009  . ALLERGIC RHINITIS 05/31/2009  . SHORTNESS OF BREATH 05/31/2009  . Cough 05/31/2009    Orientation RESPIRATION BLADDER Height & Weight     Self  O2 (3L/min) Incontinent Weight: 143 lb 3 oz (64.949 kg) Height:   (162.6 cm) (from previous encounter)  BEHAVIORAL SYMPTOMS/MOOD NEUROLOGICAL BOWEL NUTRITION STATUS      Incontinent Diet (low sodium)  AMBULATORY STATUS COMMUNICATION OF NEEDS Skin   Extensive Assist   Normal  Personal Care Assistance Level of Assistance              Functional Limitations Info  Speech     Speech Info: Impaired    SPECIAL CARE FACTORS FREQUENCY  PT (By licensed PT), Speech therapy     PT Frequency:  (2x/week)               Contractures      Additional Factors Info  Code Status, Allergies Code Status Info:  (DNR) Allergies Info:  (Aspirin, Calan, Crestor, Escitalopram Oxalate, Lipitor, Lopressor, Nitroglycerin, Norvasc, Nsaids, Oxycodone, Prednisone, Sulfa Antibiotics, Vimovo, Penicillins)           Current Medications (02/06/2016):  This is the current hospital active medication list Current Facility-Administered Medications  Medication Dose Route Frequency Provider Last Rate Last Dose  . 0.9 %  sodium chloride infusion   Intravenous Continuous Hillary BowJared M Gardner, DO 75 mL/hr at 02/04/16 2229    . clopidogrel (PLAVIX) tablet 75 mg  75 mg Oral Daily Richarda OverlieNayana Abrol, MD   75 mg at 02/05/16 0920  . Dextromethorphan-Guaifenesin 10-100 MG/5ML 5 mL  5 mL Oral Q6H PRN Richarda OverlieNayana Abrol, MD   5 mL at 02/03/16 1029  . diltiazem (CARDIZEM CD) 24 hr capsule 240 mg  240 mg Oral Daily Richarda OverlieNayana Abrol, MD   240 mg at 02/04/16 1037  . divalproex (DEPAKOTE SPRINKLE) capsule 250 mg  250 mg Oral BID Richarda OverlieNayana Abrol, MD   250 mg at 02/05/16 2309  . docusate sodium (COLACE) capsule 100 mg  100 mg Oral BID Richarda OverlieNayana Abrol, MD   100 mg at 02/05/16 2312  . enoxaparin (LOVENOX) injection 30 mg  30 mg Subcutaneous Q24H Hillary BowJared M Gardner, DO   30 mg at 02/05/16 [id number]  . fluticasone furoate-vilanterol (BREO ELLIPTA) 100-25 MCG/INH 1 puff  1 puff Inhalation q morning - 10a Richarda OverlieNayana Abrol, MD   1 puff at 02/06/16 0814  . HYDROmorphone (DILAUDID) injection 1 mg  1 mg Intravenous Q4H PRN Richarda OverlieNayana Abrol, MD   1 mg at 02/03/16 1703  . insulin aspart (novoLOG) injection 0-9 Units  0-9 Units Subcutaneous TID WC Richarda OverlieNayana Abrol, MD   1 Units at 02/03/16 1338  . mirtazapine (REMERON SOL-TAB) disintegrating tablet 15 mg  15 mg Oral QHS Richarda OverlieNayana Abrol, MD   15 mg at 02/05/16 2309  . oxybutynin (DITROPAN XL) 24 hr tablet 15 mg  15 mg Oral q morning - 10a Richarda OverlieNayana Abrol, MD   15 mg at 02/05/16 0920  . polyvinyl alcohol (LIQUIFILM TEARS) 1.4 % ophthalmic solution 1  drop  1 drop Both Eyes PRN Richarda OverlieNayana Abrol, MD   1 drop at 02/03/16 1716  . potassium chloride SA (K-DUR,KLOR-CON) CR tablet 20 mEq  20 mEq Oral Daily Richarda OverlieNayana Abrol, MD   20 mEq at 02/05/16 0918  . pravastatin (PRAVACHOL) tablet 40 mg  40 mg Oral q1800 Layne BentonSharon L Biby, NP   40 mg at 02/05/16 1731  . prochlorperazine (COMPAZINE) tablet 10 mg  10 mg Oral Q6H PRN Richarda OverlieNayana Abrol, MD      . senna (SENOKOT) tablet 8.6 mg  1 tablet Oral BID Richarda OverlieNayana Abrol, MD   8.6 mg at 02/05/16 2309  . traZODone (DESYREL) tablet 25 mg  25 mg Oral TID Richarda OverlieNayana Abrol, MD   25 mg at 02/05/16 2306     Discharge Medications: Please see discharge summary for a list of discharge medications.  Relevant Imaging Results:  Relevant Lab Results:   Additional Information  (SSN: [id number])  Orson Gear, Student-SW [phone]

## 2016-02-06 NOTE — Clinical Social Work Note (Cosign Needed)
Patient has a bed at [facility]. BSW intern has made patient's daughter, [name] [name] aware. BSW intern has also contacted facility's admission coordinator to make her aware of patient's arrival. Per patient's bedside nurse, patient awaiting to be seen by OT before d/c.  BSW intern remains available.  Orson Gearatiana Caddie Randle- BSW intern [phone]

## 2016-02-06 NOTE — Care Management Important Message (Signed)
Important Message  Patient Details  Name: [name] [name] MRN: [id number] Date of Birth: 10/10/1936   Medicare Important Message Given:  Yes    Oralia RudMegan P Rodnisha Blomgren 02/06/2016, 12:51 PM

## 2016-02-06 NOTE — Progress Notes (Signed)
RT Note: Patient is unable to take a good deep breath with her Breo inhaler. Dr. Susie CassetteAbrol her physician has been paged notifying her of that to see if she wants to change to a different type. Dr. Susie CassetteAbrol has called back and is going to write for a different type of inhaler for her. Rt will continue to monitor.

## 2016-02-06 NOTE — Progress Notes (Signed)
Pt discharging at this time via [facility] transport taking all personal belongings. Family at bedside. Discharge paperwork given to transporters. IV's discontinued, dry dressing applied. No complaints of pain or discomfort. No noted distress. Report called in to nurse, Felipa EthValarie.

## 2016-02-06 NOTE — Clinical Social Work Note (Cosign Needed)
Clinical Social Work Assessment  Patient Details  Name: [name] [name] MRN: [id number] Date of Birth: 04/04/1936  Date of referral:  02/06/16               Reason for consult:  Facility Placement, Discharge Planning                Permission sought to share information with:  Family Supports Permission granted to share information::  Yes, Verbal Permission Granted  Name::      [name]([name] Programmer, multimediamith)  Agency::   (SNF)  Relationship::   (daughter)  Contact Information:   [phone])  Housing/Transportation Living arrangements for the past 2 months:  Assisted Living Facility Source of Information:  Adult Children Patient Interpreter Needed:  None Criminal Activity/Legal Involvement Pertinent to Current Situation/Hospitalization:  No - Comment as needed Significant Relationships:  Adult Children Lives with:  Facility Resident Do you feel safe going back to the place where you live?  No Need for family participation in patient care:  Yes (Comment)  Care giving concerns:   Patient's daughter has expressed concerns about patient not being seen by OT. Patient's daughter stated that RNCM made her aware on yesterday that patient would be seen by OT before d/c.  Patient's daughter thinks that patient requires more assistance with feeding before returning to facility.    Social Worker assessment / plan:   Patient a/o x1 (self only). BSW intern has spoken with patient's daughter by phone in reference to discharge disposition. BSW intern has informed patient's daughter of SNF recommendation given by PT. BSW intern has explained SNF process as well as insurance coverage with patient's daughter. [facility] is from [facility] (memory care section). Patient has previously been at [facility] for [facility] in the past. However, patient's family is preferring for patient to complete rehab at [facility] in [place]. In the event that [facility] doesn't have a bed, patient's family has agreed for patient to  d/c to [facility]. BSW intern to fax patient's clinical to SN[place] [facility]. BSW intern to f/u with patient's daughter once clincial have been reviewed by facility. Per patient's MD, plan of d/c is scheduled for today, 03/28. BSW intern remains available.   Employment status:  Retired Health and safety inspectornsurance information:  [employer] PT Recommendations:  Skilled Nursing Facility Information / Referral to community resources:  Skilled Nursing Facility  Patient/Family's Response to care:   Patient's daughter appreciated Social Work intervention given by [employer]otBSW intern.   Patient/Family's Understanding of and Emotional Response to Diagnosis, Current Treatment, and Prognosis:   Patient's daughter understands need for further medical care and [facility].  Emotional Assessment Appearance:   (unable to assess) Attitude/Demeanor/Rapport:  Unable to Assess Affect (typically observed):  Unable to Assess Orientation:  Oriented to Self Alcohol / Substance use:  Not Applicable Psych involvement (Current and /or in the community):  No (Comment)  Discharge Needs  Concerns to be addressed:  No discharge needs identified Readmission within the last 30 days:  No Current discharge risk:  None Barriers to Discharge:  No Barriers Identified   Orson Gearatiana Rakesh Dutko, Student-SW 02/06/2016, 11:00 AM

## 2016-02-06 NOTE — Evaluation (Signed)
Occupational Therapy Evaluation and Discharge Summary Patient Details Name: [name] [name] MRN: [id number] DOB: 09/28/1936 Today's Date: 02/06/2016    History of Present Illness [age] y.o. female with a history of atrial fibrillation, Coumadin was stopped secondary to frequent falls TIA, COPD for which the patient has been enrolled in hospice services through hospice of [facility], CHF, pacemaker placement, diabetes mellitus, and dementia brought to the emergency room on 3-23 and code stroke status following an apparent change in mental status. Patient appeared to be somewhat less responsive and more confused than the baseline. No focal weakness was noted. CT scan of her head showed no acute intracranial abnormality. Patient has a history of recurrent urinary tract infections and is currently on antibiotic for UTI. Repeat CT scan of the brain showed left posterior parietal/occipital wedge shaped hypodensity compatible with an infarct. Patient has been seen and evaluated by neurology. She has ongoing confusion and aphasia.   Clinical Impression   Pt admitted for the above diagnosis and would benefit from further therapy at her SNF for the deficits listed below. Pt receives a great amount of assist with adls before admission but now is essentially dependent for all adls. Feel pt going to SNF would allow her the possibility of returning to her baseline with adls so she can eventually return to her ALF.      Follow Up Recommendations  SNF;Supervision/Assistance - 24 hour    Equipment Recommendations  None recommended by OT    Recommendations for Other Services       Precautions / Restrictions Precautions Precautions: Fall Restrictions Weight Bearing Restrictions: No      Mobility Bed Mobility Overal bed mobility: Needs Assistance Bed Mobility: Rolling;Supine to Sit Rolling: Max assist   Supine to sit: Max assist     General bed mobility comments: Pt not really following commands, She  inconsistently followed simple one step commands and needed maximum tactile cues for initiation.   Transfers Overall transfer level: Needs assistance Equipment used: 1 person hand held assist Transfers: Sit to/from [employer] CorporationStand;Stand Pivot Transfers Sit to Stand: Mod assist Stand pivot transfers: Max assist       General transfer comment: Pt unable to move feet on her own to transfer.  Feet became crossed during pivot transfer and therapist had to correct.    Balance Overall balance assessment: Needs assistance Sitting-balance support: Feet supported Sitting balance-Leahy Scale: Poor Sitting balance - Comments: Needed Mod A to sit EOB, but improved once pt was more alert sitting on bedside commode.    Standing balance support: During functional activity Standing balance-Leahy Scale: Zero Standing balance comment: Pt must have outside assist to stand.                            ADL Overall ADL's : Needs assistance/impaired Eating/Feeding: Maximal assistance;Sitting Eating/Feeding Details (indicate cue type and reason): hand over hand assist provided. pt attempted at times to complete task but most of time relied on hand over hand therapist assist. Grooming: Wash/dry face;Wash/dry hands;Maximal assistance Grooming Details (indicate cue type and reason): hand over hand assist to initiate with very little follow through. Upper Body Bathing: Total assistance;Sitting   Lower Body Bathing: Total assistance;Bed level   Upper Body Dressing : Total assistance;Sitting   Lower Body Dressing: Total assistance;Sit to/from stand   Toilet Transfer: Maximal assistance;Stand-pivot Toilet Transfer Details (indicate cue type and reason): pt pivoted holding to back of therapists arms.  Min assist needed  to pivot pts feet for her. Toileting- Clothing Manipulation and Hygiene: Total assistance;Sit to/from stand       Functional mobility during ADLs: Maximal assistance General ADL Comments:  Pt dependent for most adls at this time.  Pt not following commands consistently and very lethargic during evaluation.     Vision Vision Assessment?:  (unable to correctly assess.) Additional Comments: Pt would not keep eyes open long enough to evaluate.  When eyes were open it appears she can see some but would not focus on things in front of her when asked to do son.   Perception Perception Perception Tested?: No   Praxis Praxis Praxis tested?: Not tested    Pertinent Vitals/Pain Pain Assessment: Faces Faces Pain Scale: No hurt     Hand Dominance Right   Extremity/Trunk Assessment Upper Extremity Assessment Upper Extremity Assessment: Generalized weakness   Lower Extremity Assessment Lower Extremity Assessment: Defer to PT evaluation   Cervical / Trunk Assessment Cervical / Trunk Assessment: Kyphotic   Communication Communication Communication: Expressive difficulties;HOH   Cognition Arousal/Alertness: Lethargic Behavior During Therapy: Flat affect Overall Cognitive Status: Difficult to assess       Memory: Decreased short-term memory;Decreased recall of precautions             General Comments       Exercises       Shoulder Instructions      Home Living Family/patient expects to be discharged to:: Skilled nursing facility   Available Help at Discharge: Skilled Nursing Facility Type of Home: Skilled Nursing Facility                           Additional Comments: Pt came from assisted ling.       Prior Functioning/Environment Level of Independence: Needs assistance  Gait / Transfers Assistance Needed: Pt non ambulatory, pt w/c bound at baseline. Needed Min-Mod A for transfers.  ADL's / Homemaking Assistance Needed: Needs help with dressing, bathing, toileting.    Comments: Pt's daughter stated that she needed help to transfer in/out of the w/c at baseline and has been uanble to walk for some time.     OT Diagnosis: Generalized  weakness;Cognitive deficits;Blindness and low vision   OT Problem List: Decreased strength;Decreased activity tolerance;Impaired balance (sitting and/or standing);Impaired vision/perception;Decreased coordination;Decreased cognition;Decreased safety awareness;Decreased knowledge of use of DME or AE   OT Treatment/Interventions:      OT Goals(Current goals can be found in the care plan section) Acute Rehab OT Goals Patient Stated Goal: not able to state OT Goal Formulation: All assessment and education complete, DC therapy  OT Frequency:     Barriers to D/C:            Co-evaluation              End of Session Nurse Communication: Mobility status  Activity Tolerance: Patient limited by lethargy Patient left: in chair;with call bell/phone within reach;with chair alarm set   Time: 1040-1052 OT Time Calculation (min): 12 min Charges:  OT General Charges $OT Visit: 1 Procedure OT Evaluation $OT Eval Moderate Complexity: 1 Procedure G-Codes:    Hope Budds 02-29-2016, 11:49 AM  [phone]

## 2016-02-07 ENCOUNTER — Other Ambulatory Visit
Admission: RE | Admit: 2016-02-07 | Discharge: 2016-02-07 | Disposition: A | Discharge status: Home / Self Care | Payer: No Typology Code available for payment source | Source: Ambulatory Visit | Attending: Internal Medicine | Admitting: Internal Medicine

## 2016-02-07 DIAGNOSIS — R34 Anuria and oliguria: Secondary | ICD-10-CM | POA: Insufficient documentation

## 2016-02-07 LAB — URINALYSIS COMPLETE WITH MICROSCOPIC (ARMC ONLY)
BACTERIA UA: NONE SEEN
Bilirubin Urine: NEGATIVE
Glucose, UA: NEGATIVE mg/dL
Hgb urine dipstick: NEGATIVE
Ketones, ur: NEGATIVE mg/dL
LEUKOCYTES UA: NEGATIVE
Nitrite: NEGATIVE
PROTEIN: 30 mg/dL — AB
SPECIFIC GRAVITY, URINE: 1.021 (ref 1.005–1.030)
SQUAMOUS EPITHELIAL / LPF: NONE SEEN
WBC, UA: NONE SEEN WBC/hpf (ref 0–5)
pH: 6 (ref 5.0–8.0)

## 2016-02-08 LAB — GLUCOSE, CAPILLARY
GLUCOSE-CAPILLARY: 100 mg/dL — AB (ref 65–99)
Glucose-Capillary: 110 mg/dL — ABNORMAL HIGH (ref 65–99)

## 2016-02-09 LAB — URINE CULTURE: CULTURE: NO GROWTH

## 2016-02-10 ENCOUNTER — Encounter
Admission: RE | Admit: 2016-02-10 | Discharge: 2016-02-10 | Disposition: A | Discharge status: Home / Self Care | Payer: Medicare Other | Source: Ambulatory Visit | Attending: Internal Medicine | Admitting: Internal Medicine

## 2016-02-11 LAB — GLUCOSE, CAPILLARY
GLUCOSE-CAPILLARY: 118 mg/dL — AB (ref 65–99)
GLUCOSE-CAPILLARY: 108 mg/dL — AB (ref 65–99)
GLUCOSE-CAPILLARY: 99 mg/dL (ref 65–99)
GLUCOSE-CAPILLARY: 176 mg/dL — AB (ref 65–99)
Glucose-Capillary: 123 mg/dL — ABNORMAL HIGH (ref 65–99)
Glucose-Capillary: 111 mg/dL — ABNORMAL HIGH (ref 65–99)
Glucose-Capillary: 86 mg/dL (ref 65–99)

## 2016-02-12 LAB — GLUCOSE, CAPILLARY
GLUCOSE-CAPILLARY: 109 mg/dL — AB (ref 65–99)
GLUCOSE-CAPILLARY: 130 mg/dL — AB (ref 65–99)
GLUCOSE-CAPILLARY: 123 mg/dL — AB (ref 65–99)
GLUCOSE-CAPILLARY: 154 mg/dL — AB (ref 65–99)

## 2016-02-13 LAB — GLUCOSE, CAPILLARY
GLUCOSE-CAPILLARY: 110 mg/dL — AB (ref 65–99)
GLUCOSE-CAPILLARY: 83 mg/dL (ref 65–99)
Glucose-Capillary: 102 mg/dL — ABNORMAL HIGH (ref 65–99)
Glucose-Capillary: 118 mg/dL — ABNORMAL HIGH (ref 65–99)

## 2016-02-14 LAB — GLUCOSE, CAPILLARY
GLUCOSE-CAPILLARY: 136 mg/dL — AB (ref 65–99)
GLUCOSE-CAPILLARY: 129 mg/dL — AB (ref 65–99)
GLUCOSE-CAPILLARY: 104 mg/dL — AB (ref 65–99)
GLUCOSE-CAPILLARY: 96 mg/dL (ref 65–99)
GLUCOSE-CAPILLARY: 118 mg/dL — AB (ref 65–99)
GLUCOSE-CAPILLARY: 106 mg/dL — AB (ref 65–99)
GLUCOSE-CAPILLARY: 191 mg/dL — AB (ref 65–99)
Glucose-Capillary: 157 mg/dL — ABNORMAL HIGH (ref 65–99)
Glucose-Capillary: 115 mg/dL — ABNORMAL HIGH (ref 65–99)
Glucose-Capillary: 86 mg/dL (ref 65–99)
Glucose-Capillary: 173 mg/dL — ABNORMAL HIGH (ref 65–99)
Glucose-Capillary: 149 mg/dL — ABNORMAL HIGH (ref 65–99)
Glucose-Capillary: 101 mg/dL — ABNORMAL HIGH (ref 65–99)
Glucose-Capillary: 102 mg/dL — ABNORMAL HIGH (ref 65–99)
Glucose-Capillary: 105 mg/dL — ABNORMAL HIGH (ref 65–99)

## 2016-02-15 LAB — GLUCOSE, CAPILLARY
GLUCOSE-CAPILLARY: 132 mg/dL — AB (ref 65–99)
GLUCOSE-CAPILLARY: 124 mg/dL — AB (ref 65–99)
Glucose-Capillary: 107 mg/dL — ABNORMAL HIGH (ref 65–99)
Glucose-Capillary: 90 mg/dL (ref 65–99)

## 2016-02-16 LAB — GLUCOSE, CAPILLARY: Glucose-Capillary: 105 mg/dL — ABNORMAL HIGH (ref 65–99)

## 2016-02-17 LAB — URINALYSIS COMPLETE WITH MICROSCOPIC (ARMC ONLY)
BACTERIA UA: NONE SEEN
Bilirubin Urine: NEGATIVE
Glucose, UA: NEGATIVE mg/dL
HGB URINE DIPSTICK: NEGATIVE
Ketones, ur: NEGATIVE mg/dL
Nitrite: NEGATIVE
PH: 6 (ref 5.0–8.0)
Protein, ur: NEGATIVE mg/dL
SPECIFIC GRAVITY, URINE: 1.02 (ref 1.005–1.030)

## 2016-02-18 LAB — GLUCOSE, CAPILLARY
GLUCOSE-CAPILLARY: 120 mg/dL — AB (ref 65–99)
GLUCOSE-CAPILLARY: 152 mg/dL — AB (ref 65–99)
GLUCOSE-CAPILLARY: 119 mg/dL — AB (ref 65–99)
GLUCOSE-CAPILLARY: 140 mg/dL — AB (ref 65–99)
GLUCOSE-CAPILLARY: 75 mg/dL (ref 65–99)
GLUCOSE-CAPILLARY: 157 mg/dL — AB (ref 65–99)
Glucose-Capillary: 140 mg/dL — ABNORMAL HIGH (ref 65–99)
Glucose-Capillary: 154 mg/dL — ABNORMAL HIGH (ref 65–99)
Glucose-Capillary: 74 mg/dL (ref 65–99)
Glucose-Capillary: 159 mg/dL — ABNORMAL HIGH (ref 65–99)

## 2016-02-19 LAB — GLUCOSE, CAPILLARY
Glucose-Capillary: 88 mg/dL (ref 65–99)
Glucose-Capillary: 173 mg/dL — ABNORMAL HIGH (ref 65–99)

## 2016-02-20 LAB — URINE CULTURE: Culture: 90000 — AB

## 2016-02-20 LAB — GLUCOSE, CAPILLARY
GLUCOSE-CAPILLARY: 106 mg/dL — AB (ref 65–99)
Glucose-Capillary: 106 mg/dL — ABNORMAL HIGH (ref 65–99)
Glucose-Capillary: 88 mg/dL (ref 65–99)
Glucose-Capillary: 194 mg/dL — ABNORMAL HIGH (ref 65–99)

## 2016-02-21 LAB — GLUCOSE, CAPILLARY
GLUCOSE-CAPILLARY: 113 mg/dL — AB (ref 65–99)
GLUCOSE-CAPILLARY: 147 mg/dL — AB (ref 65–99)
GLUCOSE-CAPILLARY: 180 mg/dL — AB (ref 65–99)
Glucose-Capillary: 126 mg/dL — ABNORMAL HIGH (ref 65–99)

## 2016-02-22 LAB — GLUCOSE, CAPILLARY
Glucose-Capillary: 93 mg/dL (ref 65–99)
Glucose-Capillary: 85 mg/dL (ref 65–99)
Glucose-Capillary: 171 mg/dL — ABNORMAL HIGH (ref 65–99)
Glucose-Capillary: 124 mg/dL — ABNORMAL HIGH (ref 65–99)

## 2016-02-23 LAB — GLUCOSE, CAPILLARY
GLUCOSE-CAPILLARY: 114 mg/dL — AB (ref 65–99)
GLUCOSE-CAPILLARY: 84 mg/dL (ref 65–99)
Glucose-Capillary: 76 mg/dL (ref 65–99)
Glucose-Capillary: 143 mg/dL — ABNORMAL HIGH (ref 65–99)

## 2016-02-24 LAB — GLUCOSE, CAPILLARY
GLUCOSE-CAPILLARY: 103 mg/dL — AB (ref 65–99)
GLUCOSE-CAPILLARY: 91 mg/dL (ref 65–99)
GLUCOSE-CAPILLARY: 105 mg/dL — AB (ref 65–99)
Glucose-Capillary: 103 mg/dL — ABNORMAL HIGH (ref 65–99)

## 2016-02-25 LAB — GLUCOSE, CAPILLARY
GLUCOSE-CAPILLARY: 115 mg/dL — AB (ref 65–99)
Glucose-Capillary: 92 mg/dL (ref 65–99)
Glucose-Capillary: 112 mg/dL — ABNORMAL HIGH (ref 65–99)
Glucose-Capillary: 153 mg/dL — ABNORMAL HIGH (ref 65–99)

## 2016-02-26 LAB — GLUCOSE, CAPILLARY
GLUCOSE-CAPILLARY: 103 mg/dL — AB (ref 65–99)
GLUCOSE-CAPILLARY: 178 mg/dL — AB (ref 65–99)
GLUCOSE-CAPILLARY: 99 mg/dL (ref 65–99)
Glucose-Capillary: 89 mg/dL (ref 65–99)

## 2016-02-26 IMAGING — CR DG PELVIS 1-2V
1 series · 1 of 1 positions shown · non-contrast
Comparison: 11/17/2013

CLINICAL DATA: Fell this morning, BILATERAL hip pain, personal
history of hypertension, hypertrophic cardiomyopathy, pulmonary
hypertension, COPD, atrial fibrillation, type 2 diabetes, CHF

EXAM:
PELVIS - 1-2 VIEW

[t pelvis ap]
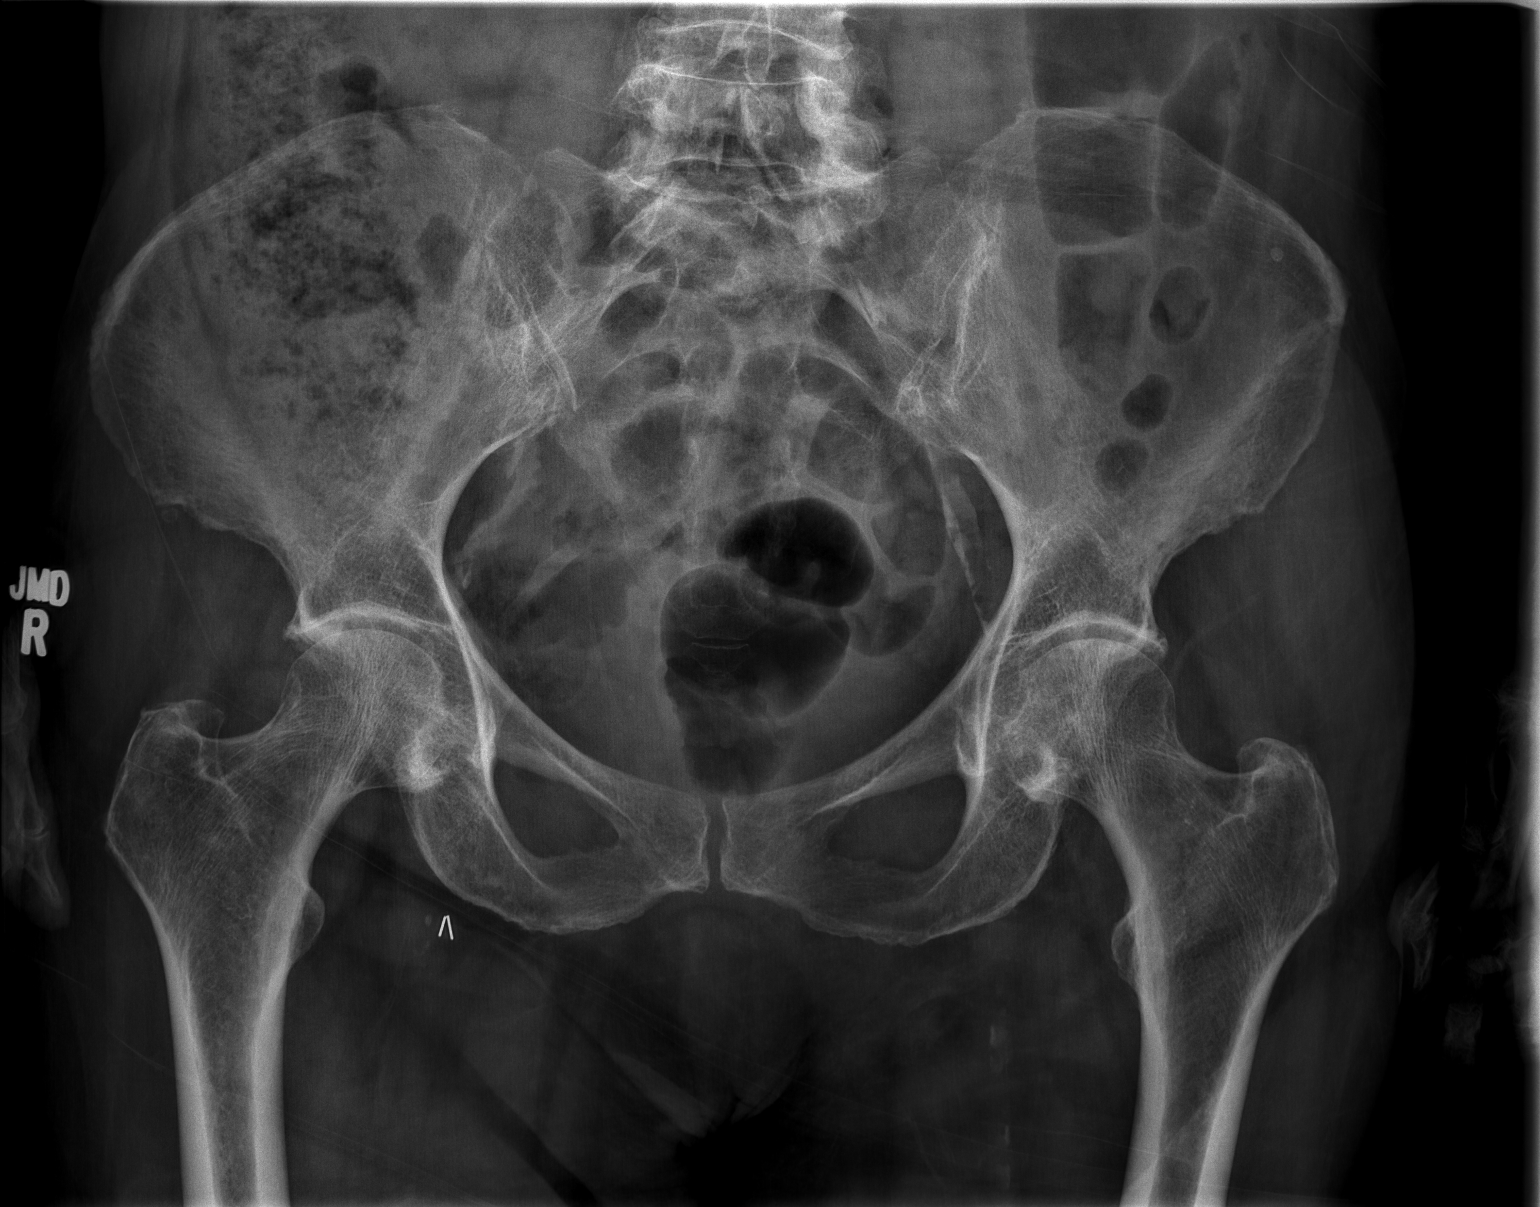

[1 of 1 positions shown; findings below may reference images not displayed]

FINDINGS: Scattered atherosclerotic calcification.

Bones demineralized.

Symmetric hip and SI joints.

No acute fracture, dislocation or bone destruction.
IMPRESSION: No acute osseous abnormalities.

## 2016-02-26 IMAGING — CR DG KNEE COMPLETE 4+V*R*
1 series · 1 of 1 positions shown · non-contrast
Comparison: None.

CLINICAL DATA: Initial encounter, pt fell while attempting to get
out of bed

EXAM:
RIGHT KNEE - COMPLETE 4+ VIEW

[x knee lat right]
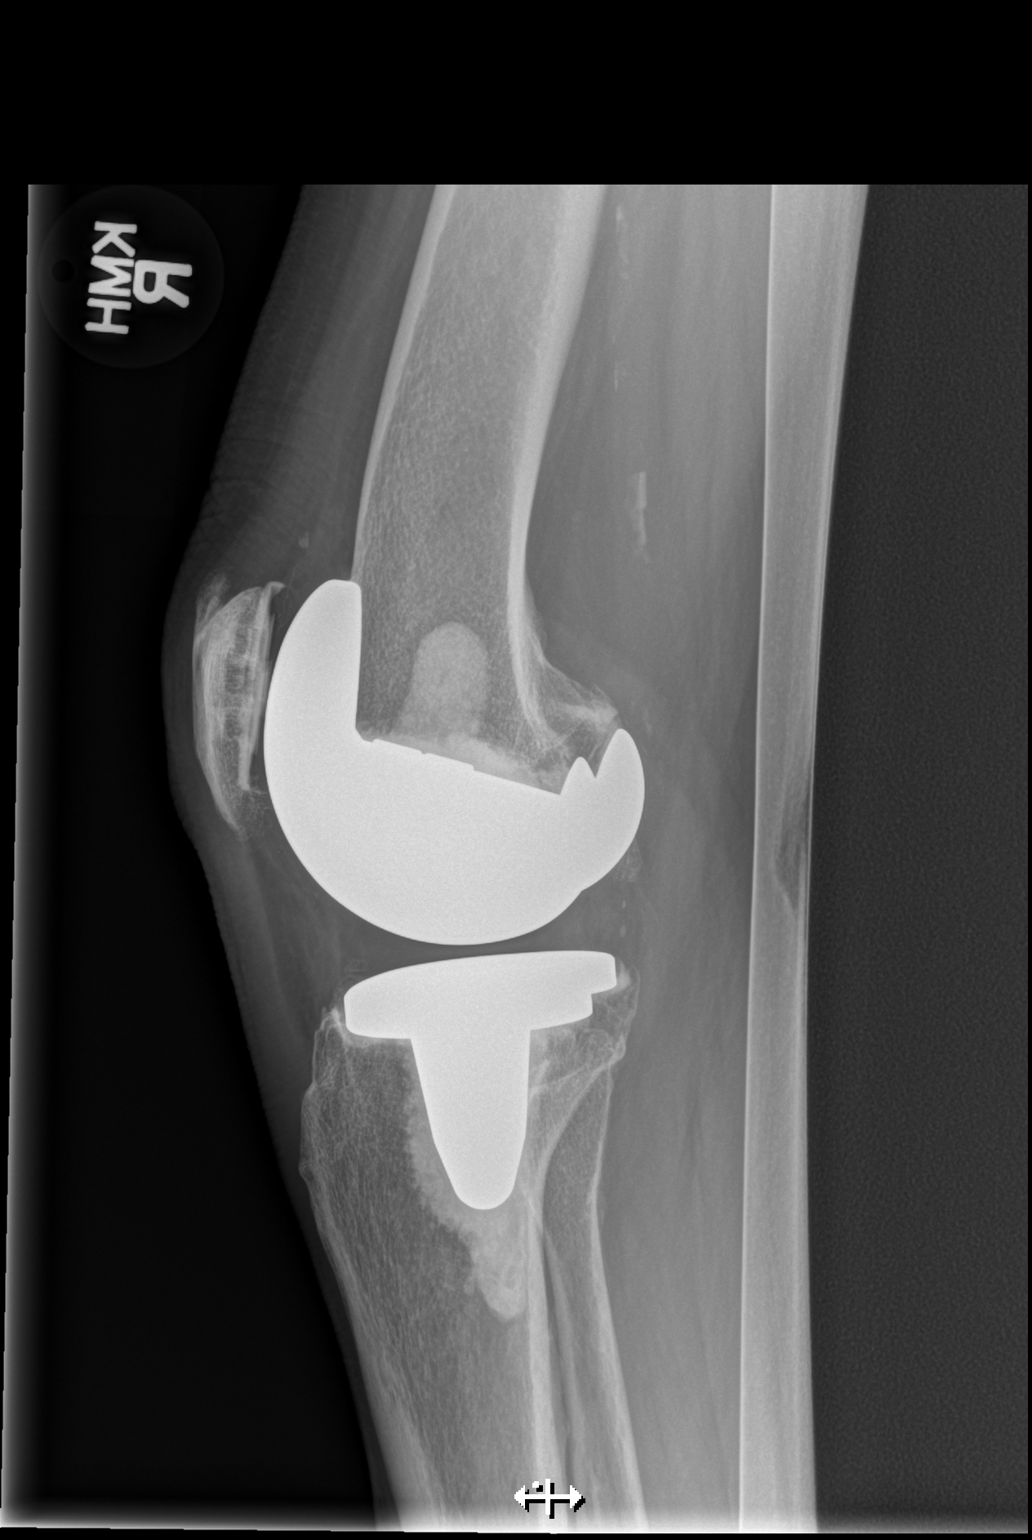

[1 of 1 positions shown; findings below may reference images not displayed]

FINDINGS: Right knee total arthroplasty noted. No evidence of fracture or
dislocation. No joint effusion.
IMPRESSION: No acute osseous abnormality.

## 2016-02-26 IMAGING — CR DG KNEE COMPLETE 4+V*L*
4 series · 4 of 4 positions shown · non-contrast
Comparison: 12/21/2011

CLINICAL DATA: Initial encounter after fall from bed this morning.
Generalized bilateral knee pain.

EXAM:
LEFT KNEE - COMPLETE 4+ VIEW

[x knee ap left (1 of 3)]
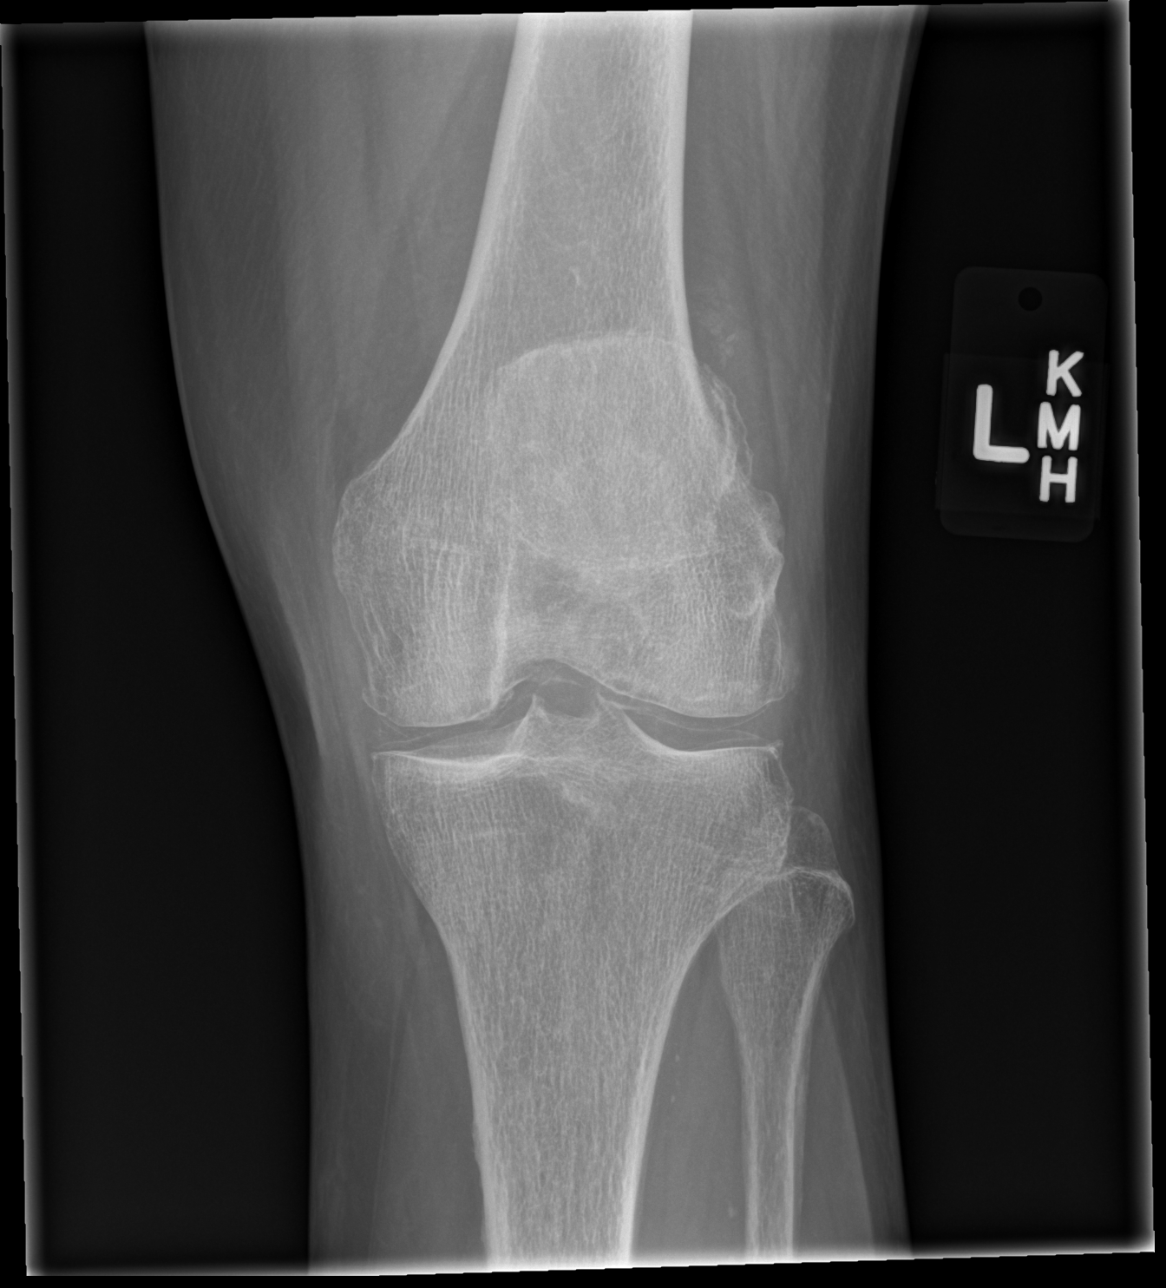

[x knee ap left (2 of 3)]
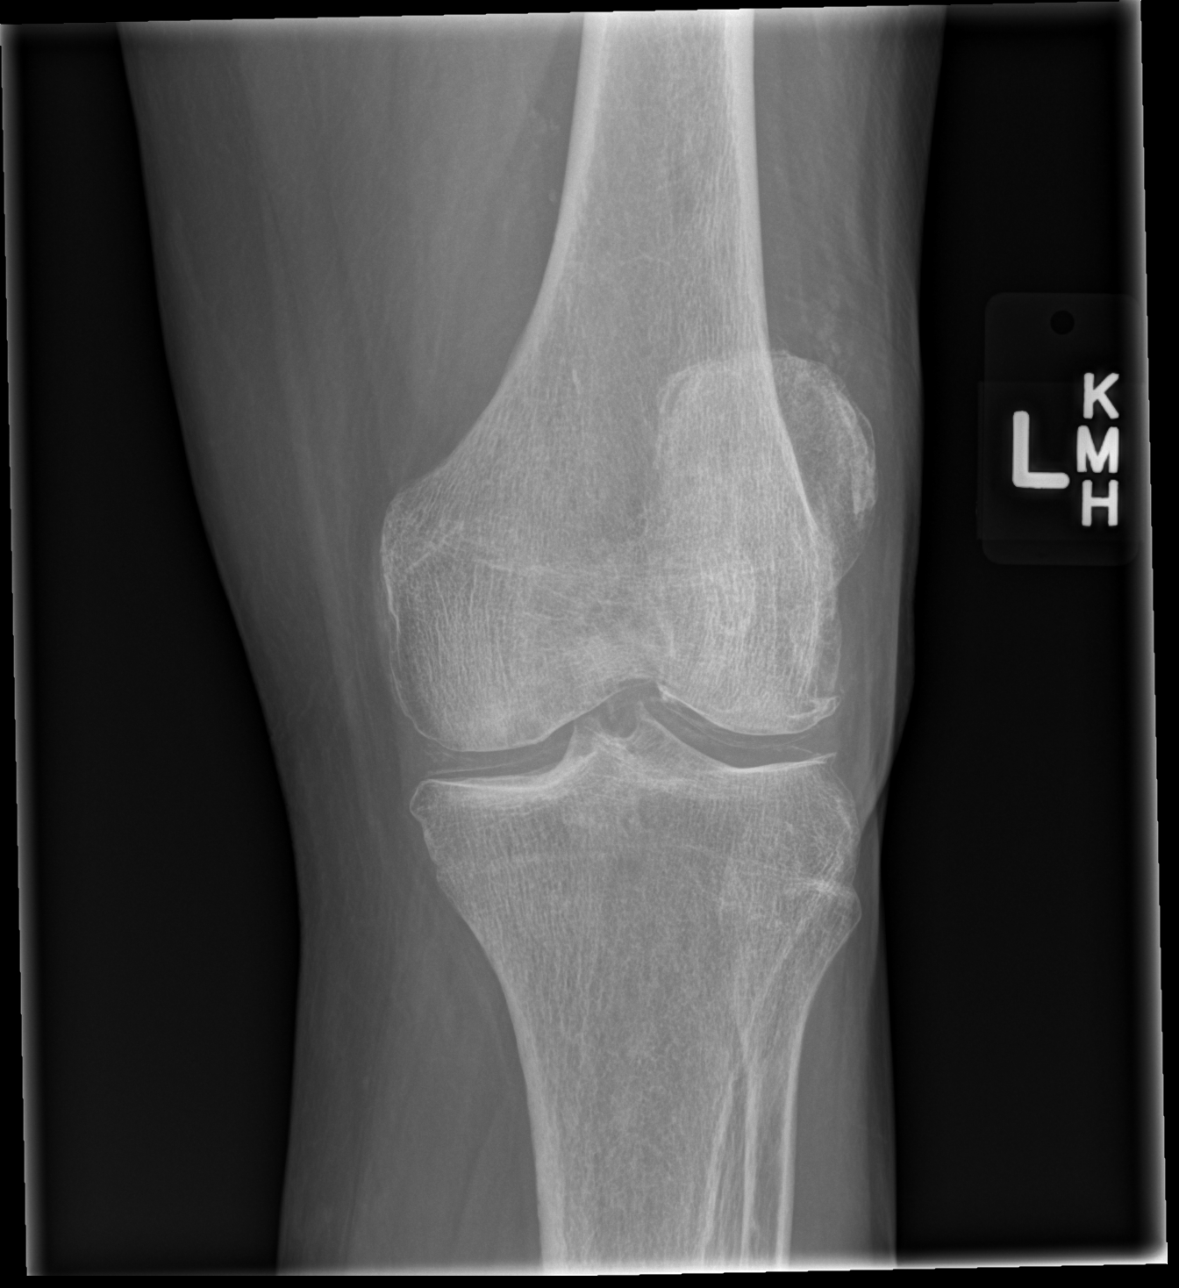

[x knee ap left (3 of 3)]
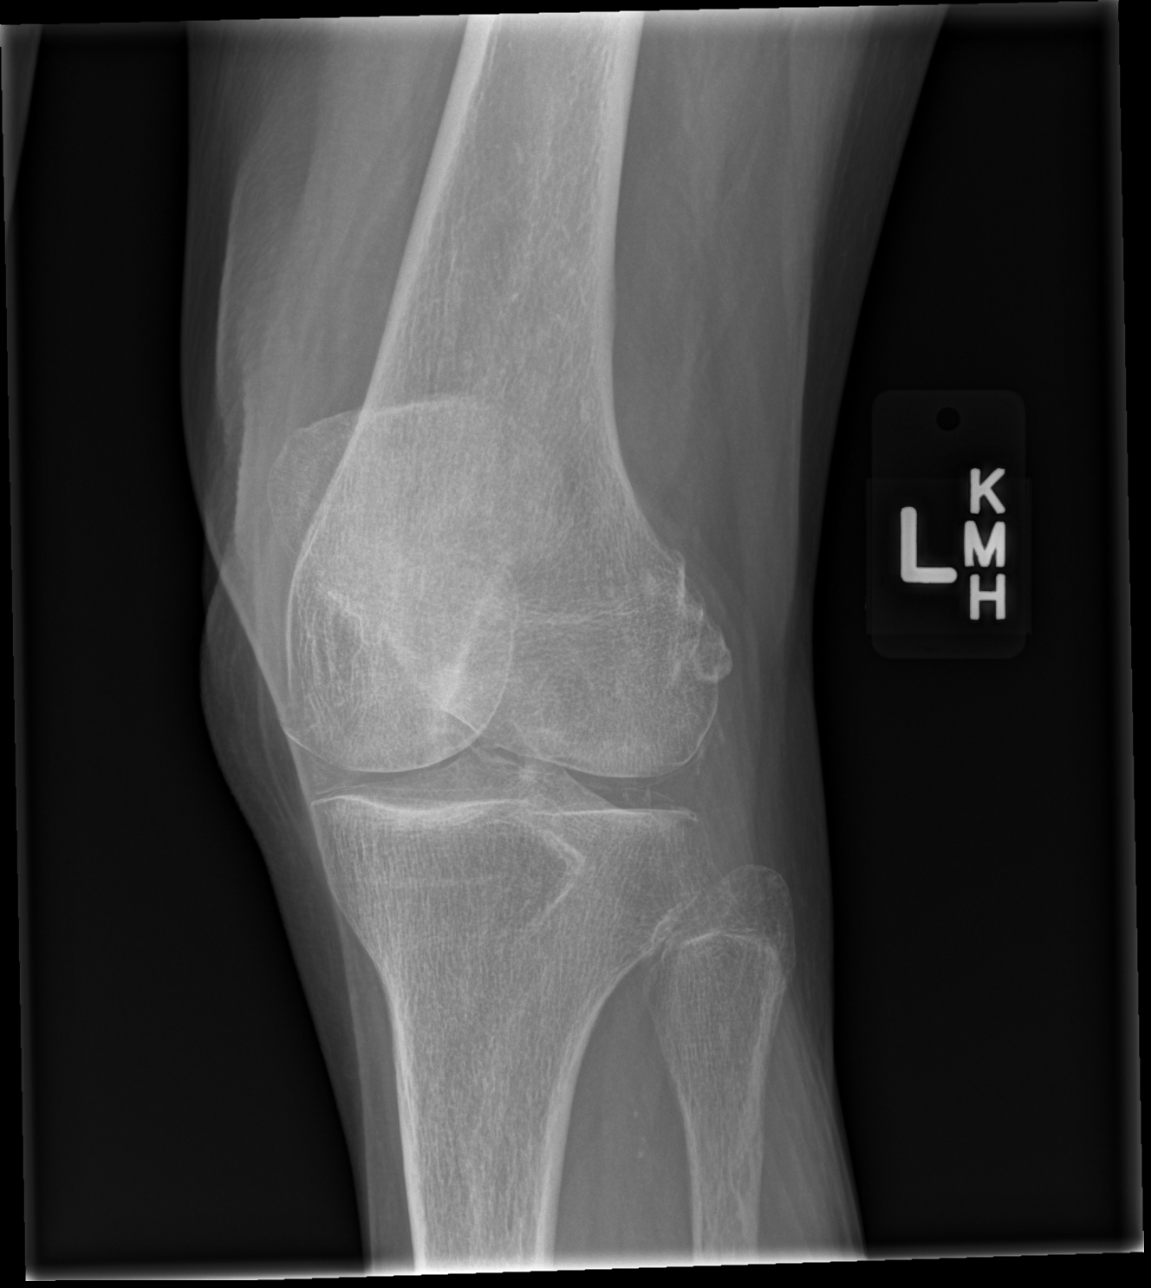

[x knee lat left]
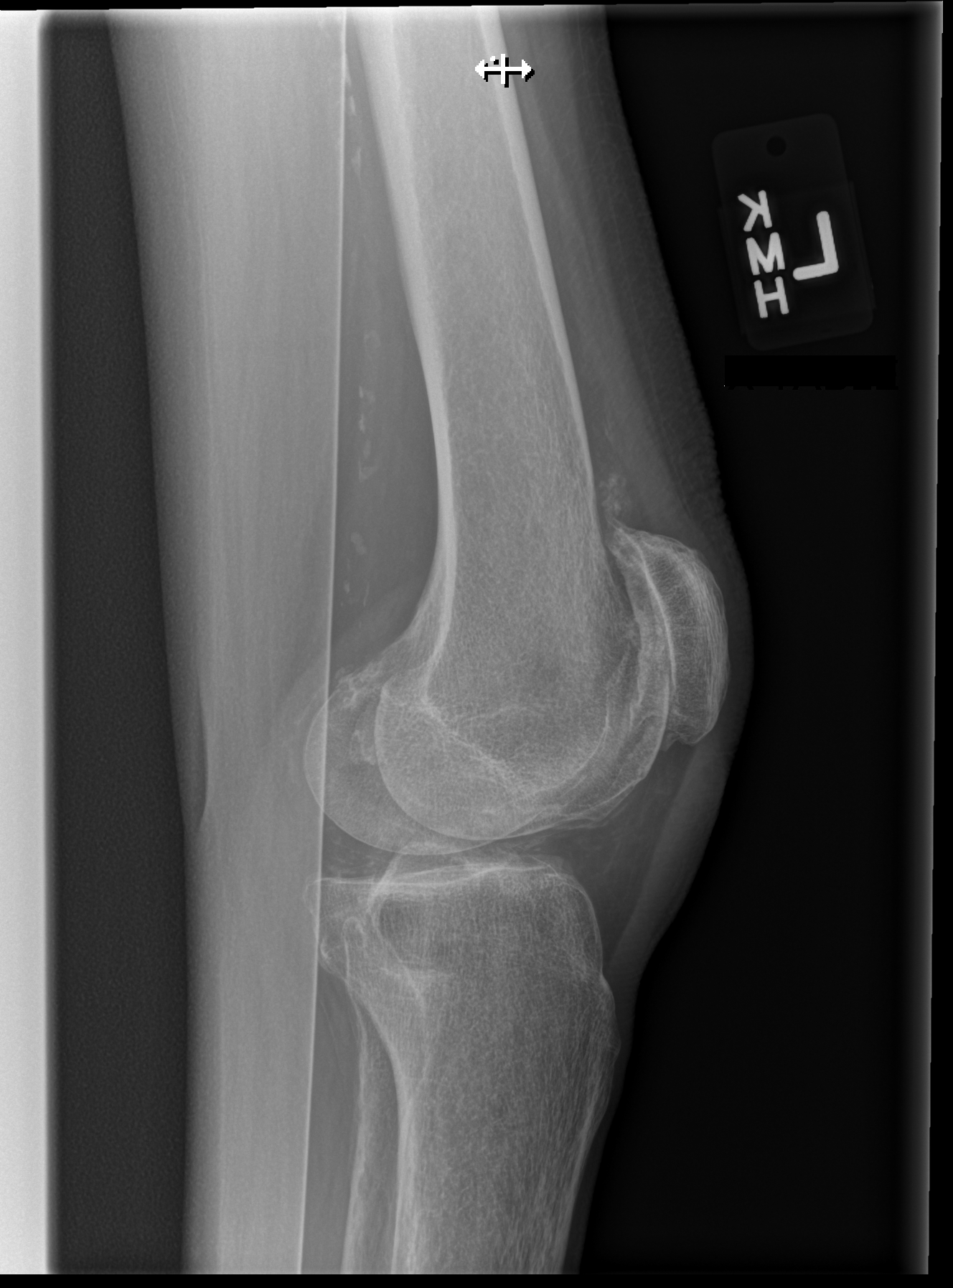

[4 of 4 positions shown; findings below may reference images not displayed]

FINDINGS: No fracture. No subluxation or dislocation. No joint effusion. No
worrisome lytic or sclerotic osseous abnormality. Hypertrophic
spurring is seen in the lateral and patellofemoral compartments.
Stable appearance of meniscal mineralization.
IMPRESSION: Degenerative changes without acute bony findings.

## 2016-02-26 IMAGING — CT CT HEAD W/O CM
4 of 6 series · 16 of 37 positions shown, 18 images · non-contrast
Comparison: CT head and cervical spine 06/21/2014.

CLINICAL DATA: Initial encounter for fall while attempting to get
out of bed. Hematoma to the forehead. Lip laceration. Skin tears and
bruising.

EXAM:
CT HEAD WITHOUT CONTRAST
CT MAXILLOFACIAL WITHOUT CONTRAST
CT CERVICAL SPINE WITHOUT CONTRAST
TECHNIQUE: Multidetector CT imaging of the head, cervical spine, and
maxillofacial structures were performed using the standard protocol
without intravenous contrast. Multiplanar CT image reconstructions
of the cervical spine and maxillofacial structures were also
generated.

[Series 3: facial st · axial · 0.33mm/px · z∈[-159,-61]mm · 5 of 75 slices shown]
[im 13/75  brain]
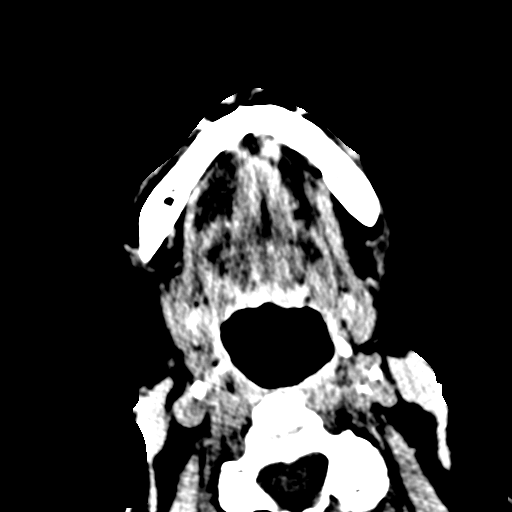
[im 25/75  brain]
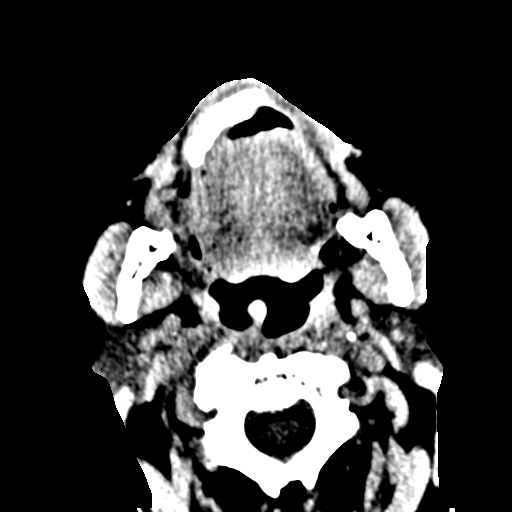
[im 38/75  brain]
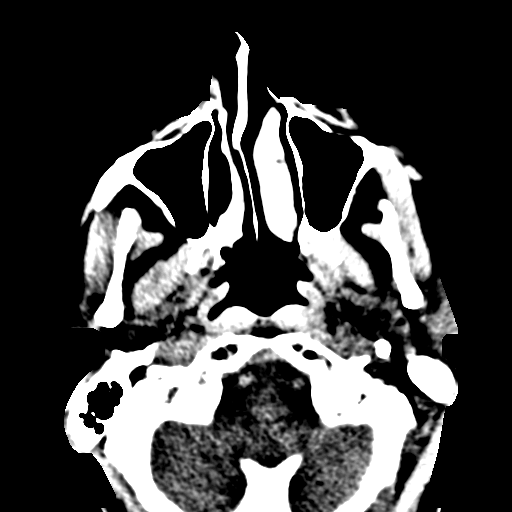
[im 50/75  brain]
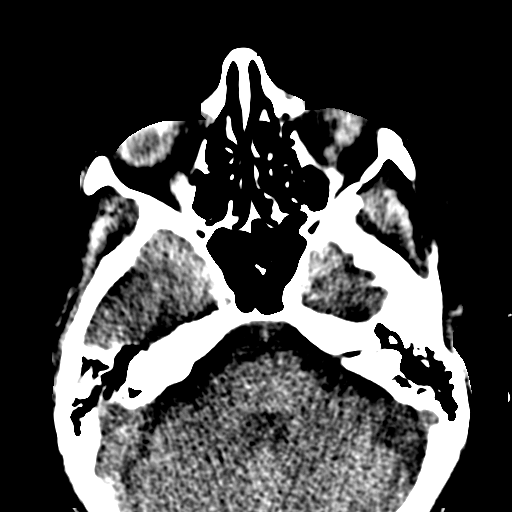
[im 62/75  brain]
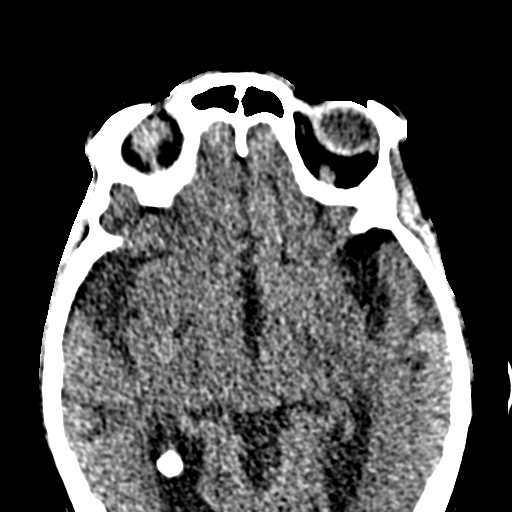

[Series 6: bone windows · axial · 0.43mm/px · z∈[-62,-26]mm · 2 of 49 slices shown]
[im 13/49  bone]
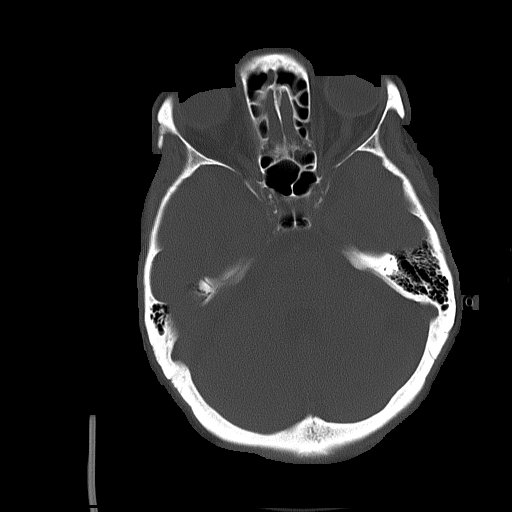
[im 25/49  bone]
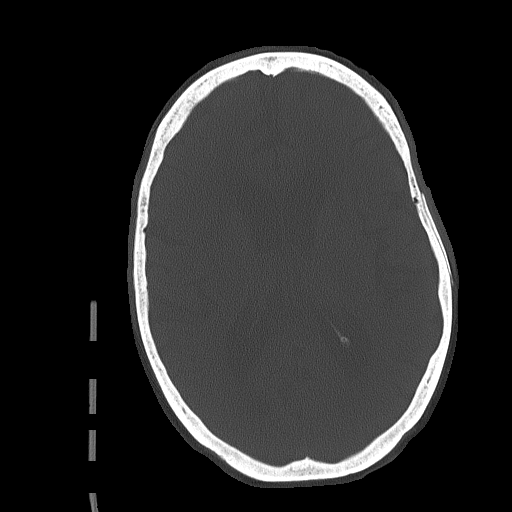

[Series 603: <mpr thick range(1)> · axial · 0.30mm/px · z∈[-257,-155]mm · 6 of 83 slices shown, 8 images]
[im 12/83  brain]
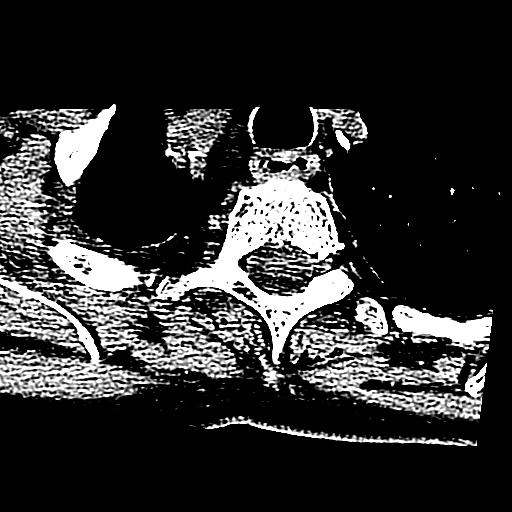
[im 12/83  bone]
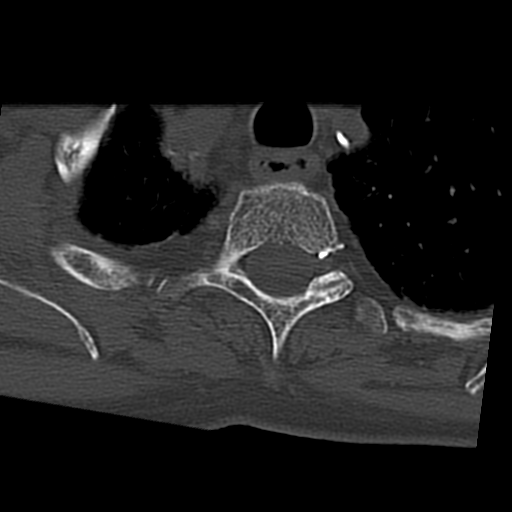
[im 24/83  brain]
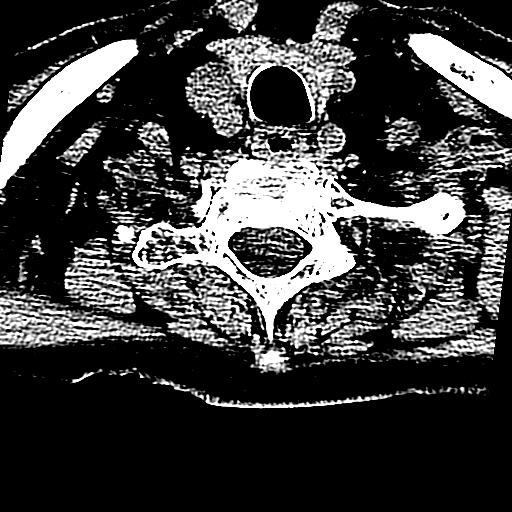
[im 36/83  brain]
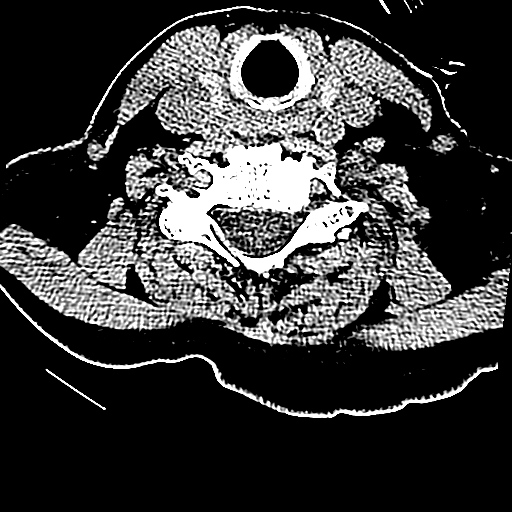
[im 47/83  brain]
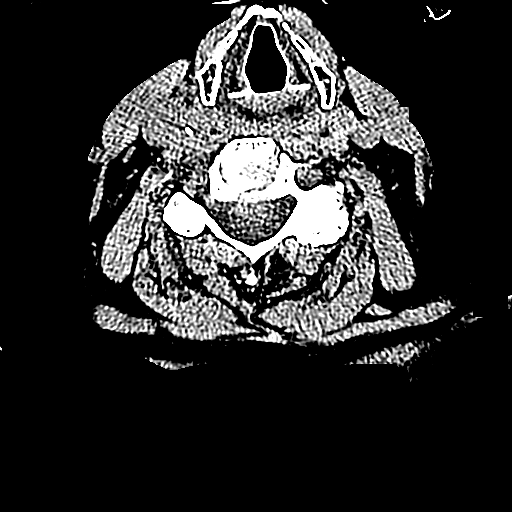
[im 59/83  brain]
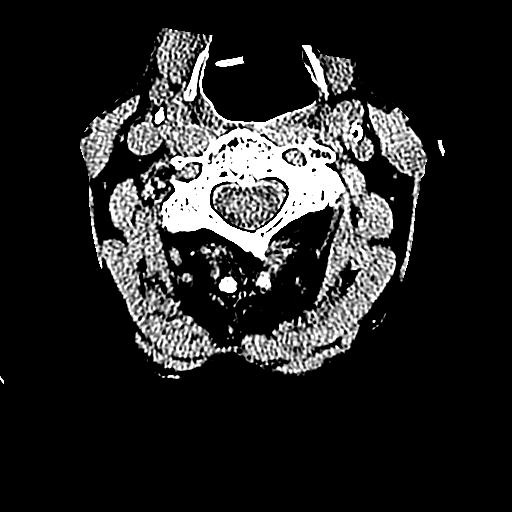
[im 59/83  bone]
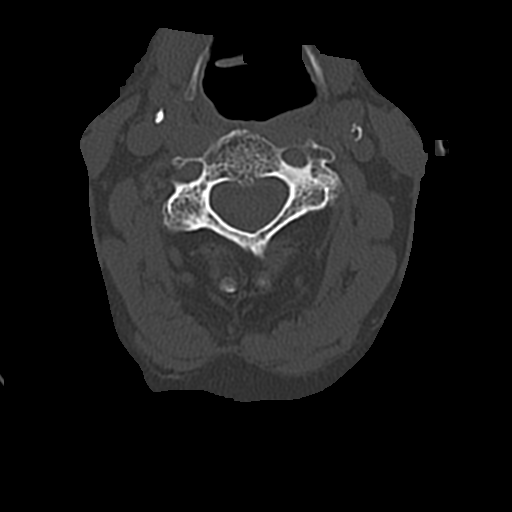
[im 71/83  brain]
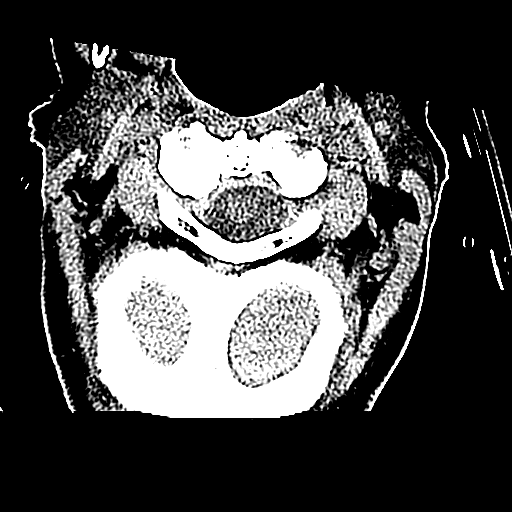

[Series 604: <mpr thick range(2)> · sagittal · 0.30mm/px · 3 of 44 slices shown]
[im 15/44  brain]
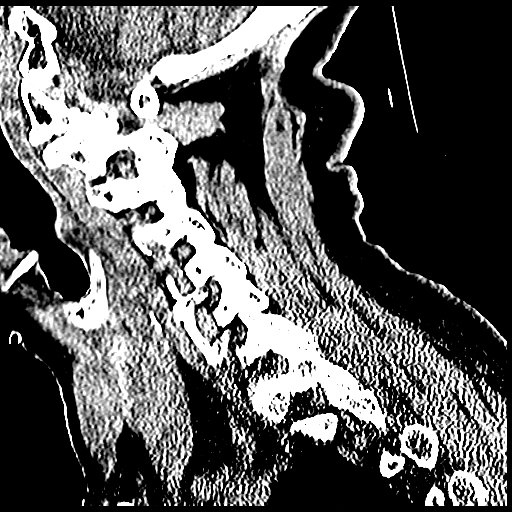
[im 22/44  brain]
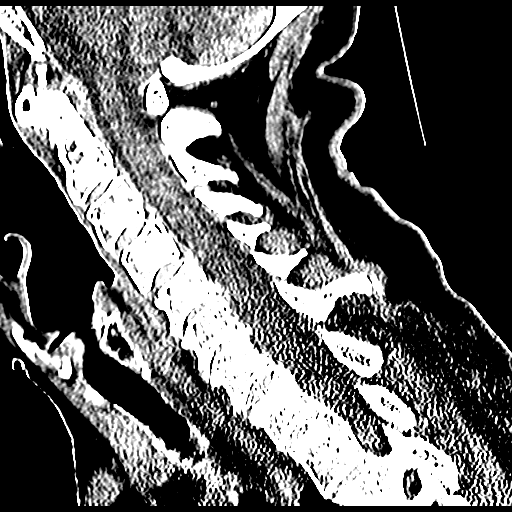
[im 29/44  brain]
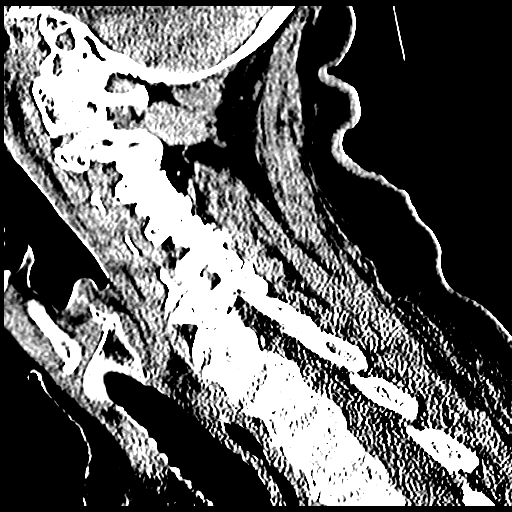

[16 of 37 positions shown; findings below may reference images not displayed]

FINDINGS: CT HEAD FINDINGS

A right paramedian frontal scalp hematoma is present. There is no
underlying fracture. The paranasal sinuses and mastoid air cells are
clear. The osseous skull is intact.

Moderate generalized atrophy and diffuse white matter disease is
present bilaterally. The ventricles are proportionate to the degree
of atrophy and stable. Insert pass fluid. A punctate calcification
is noted in the right parietal lobe, unchanged.

CT MAXILLOFACIAL FINDINGS

The right paramedian frontal scalp hematoma is again noted. There is
no underlying fracture. The facial bones are intact. The paranasal
sinuses clear. The mandible is intact and located. Three tooth
anchors are present within the mandible. The patient is edentulous.
Soft tissue swelling is noted in the upper lip with a laceration.
There is no underlying fracture.

CT CERVICAL SPINE FINDINGS

The cervical spine is imaged and skull base through T2-3. Vertebral
body heights are maintained. Slight anterolisthesis at C4-5 is
stable. There straightening of the normal cervical lordosis.
Moderate facet degenerative changes are present bilaterally, worse
on the left. There is fusion across the facet joints on the right at
C2-3. Vascular calcifications are present within the carotid
arteries bilaterally. The thyroid is within normal limits. The lung
apices are clear.
IMPRESSION: 1. Right paramedian scalp hematoma without an underlying fracture.
2. Stable moderate atrophy and diffuse white matter disease.
3. No acute intracranial abnormality.
4. Soft tissue swelling within the upper lip without an underlying
fracture.
5. Stable moderate spondylosis of the cervical spine without
fracture or traumatic subluxation. Facet disease is worse on the
left.

## 2016-02-26 IMAGING — CR DG LUMBAR SPINE COMPLETE 4+V
5 series · 5 of 5 positions shown · non-contrast
Comparison: Lumbar spine CT scan 11/18/2013.

CLINICAL DATA: 78-year-old female with history of fall this morning
complaining of pain in multiple regions, including the low back.

EXAM:
LUMBAR SPINE - COMPLETE 4+ VIEW

[t lumbar spine ap]
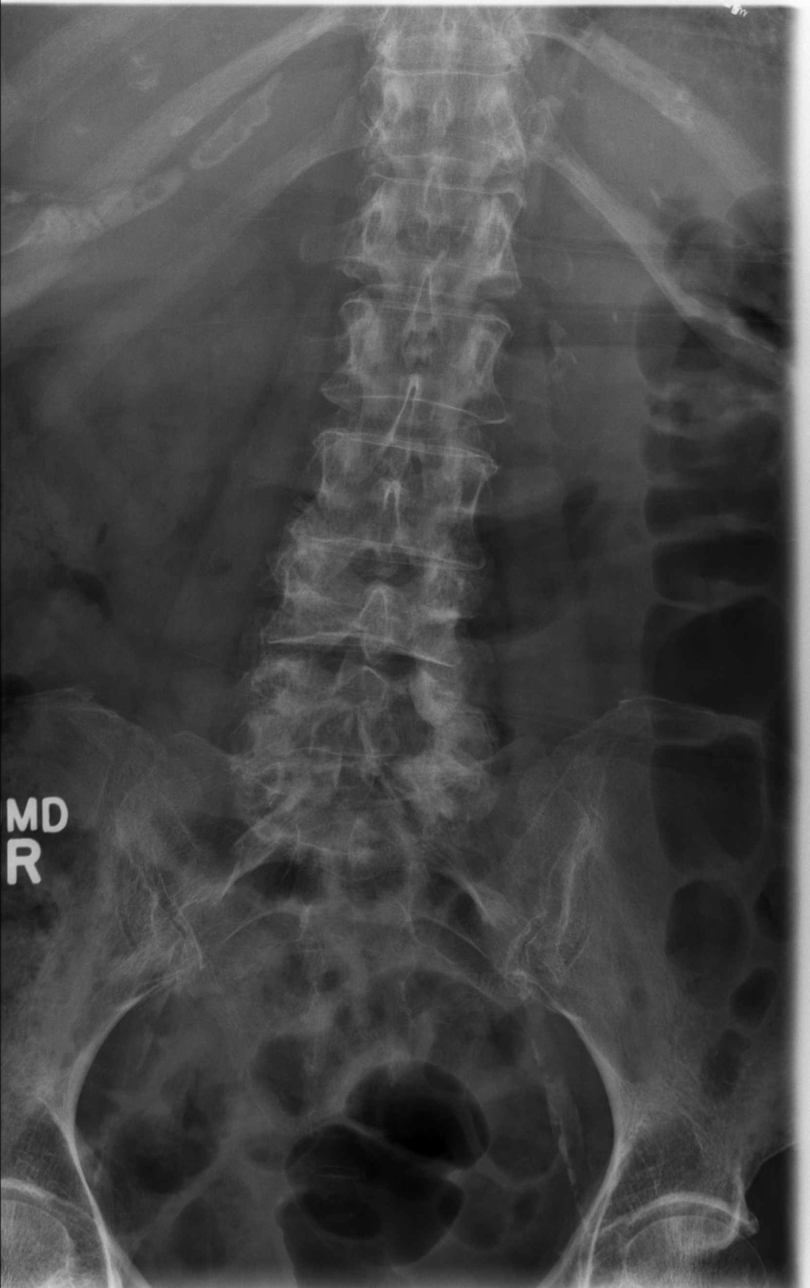

[t lumbar spine obl (1 of 2)]
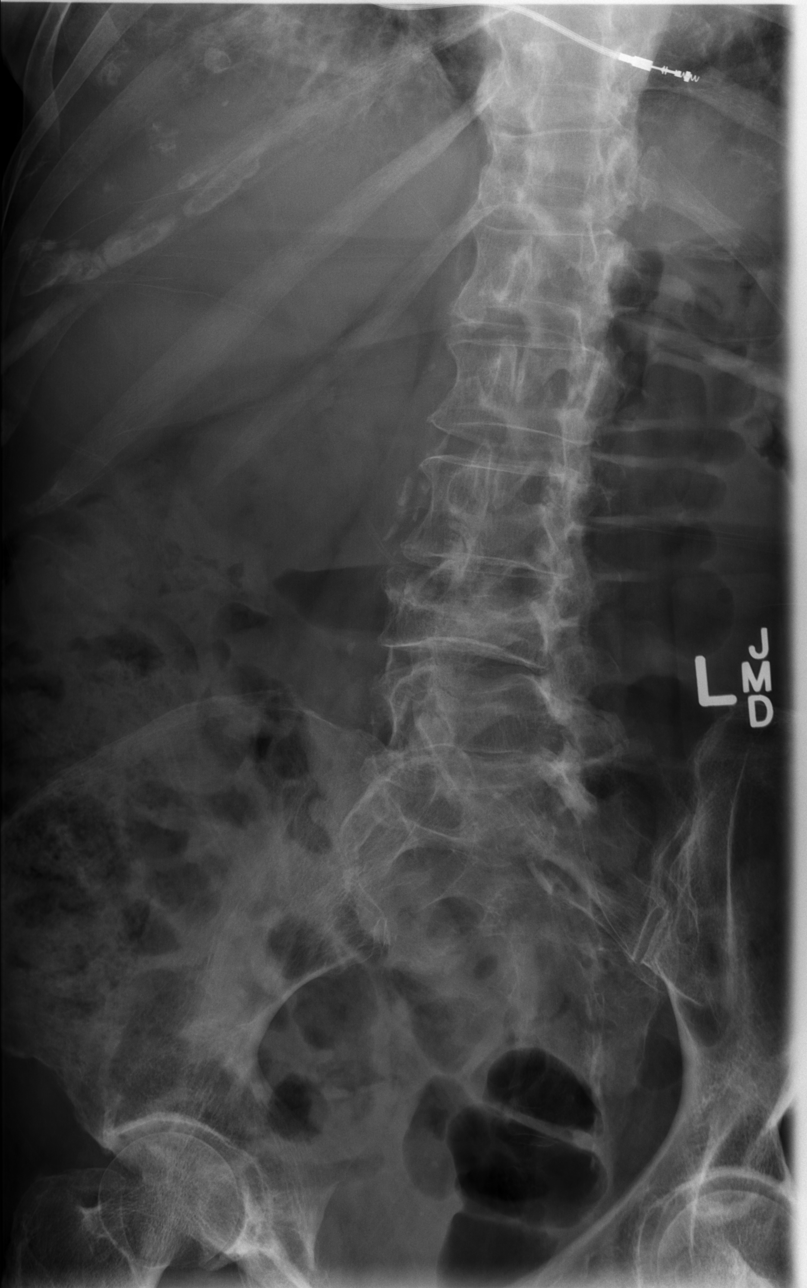

[t lumbar spine obl (2 of 2)]
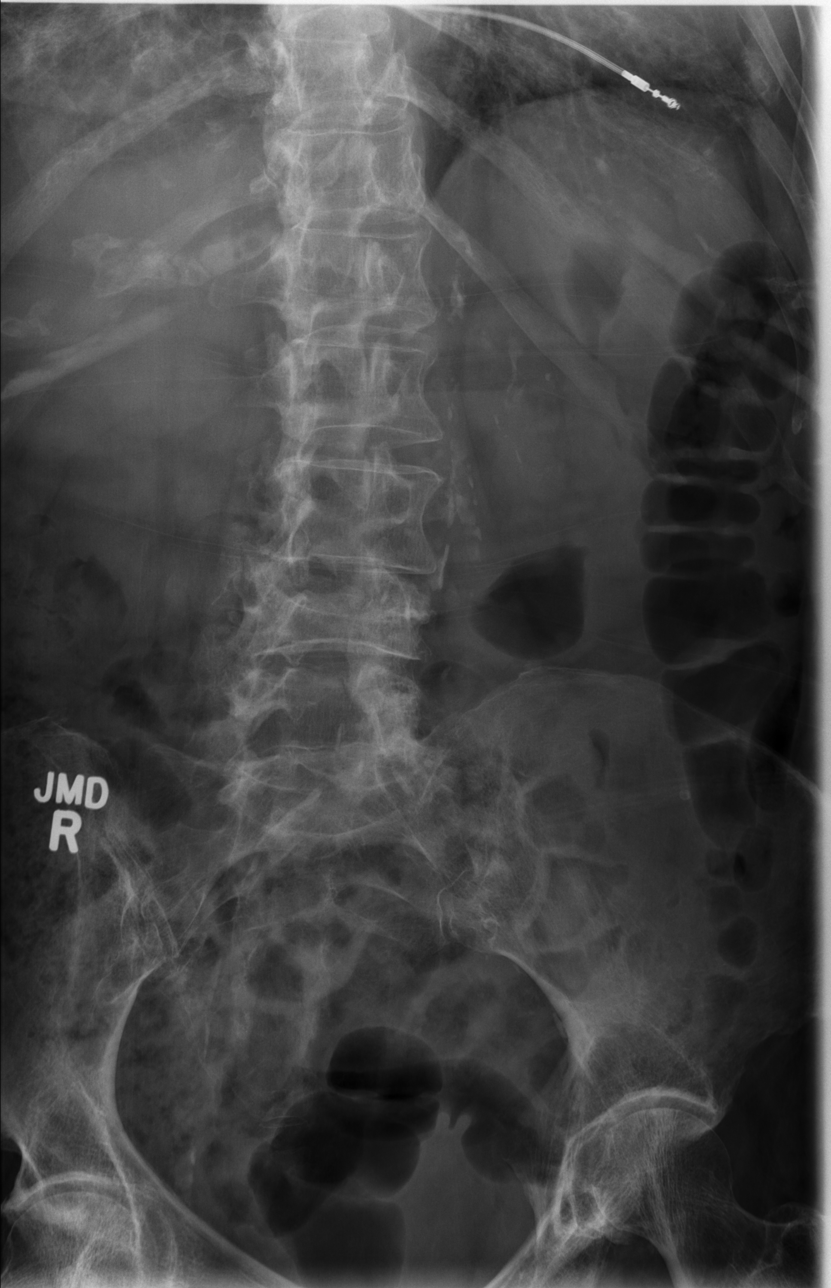

[t lumbar spine lat]
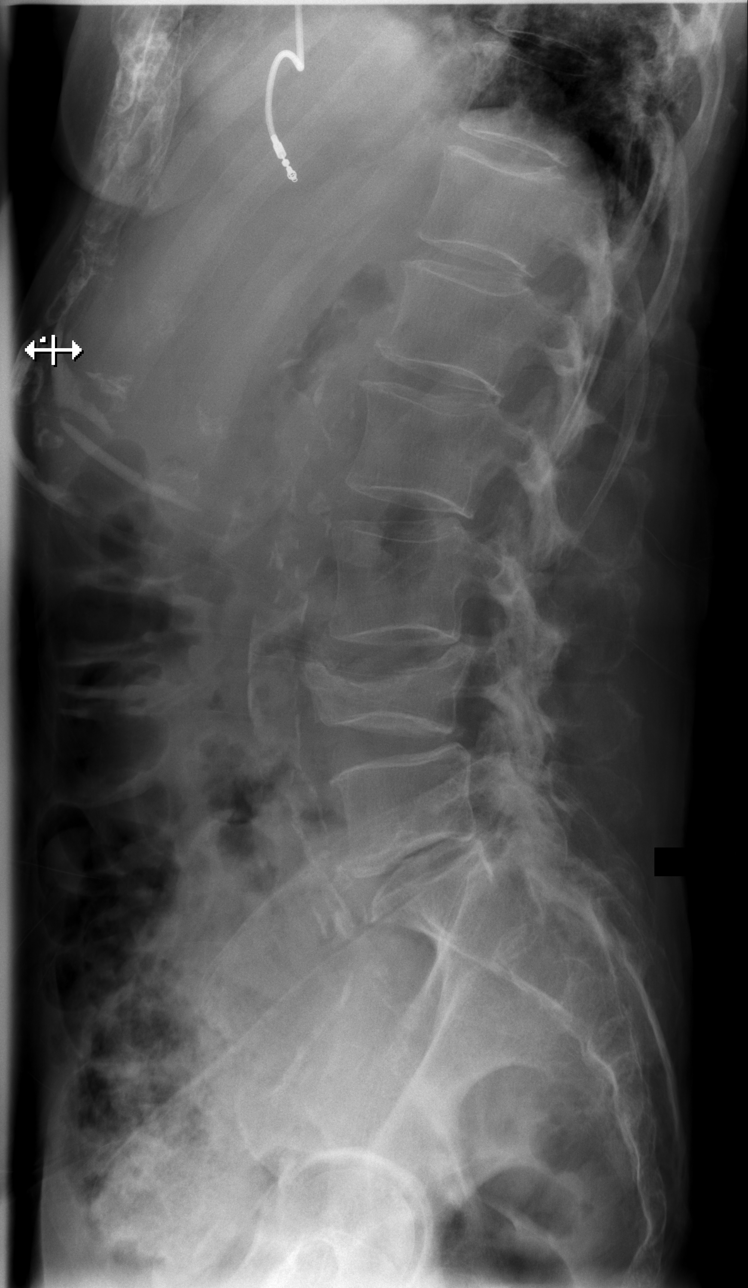

[t lumbar l-5 s-1 spot]
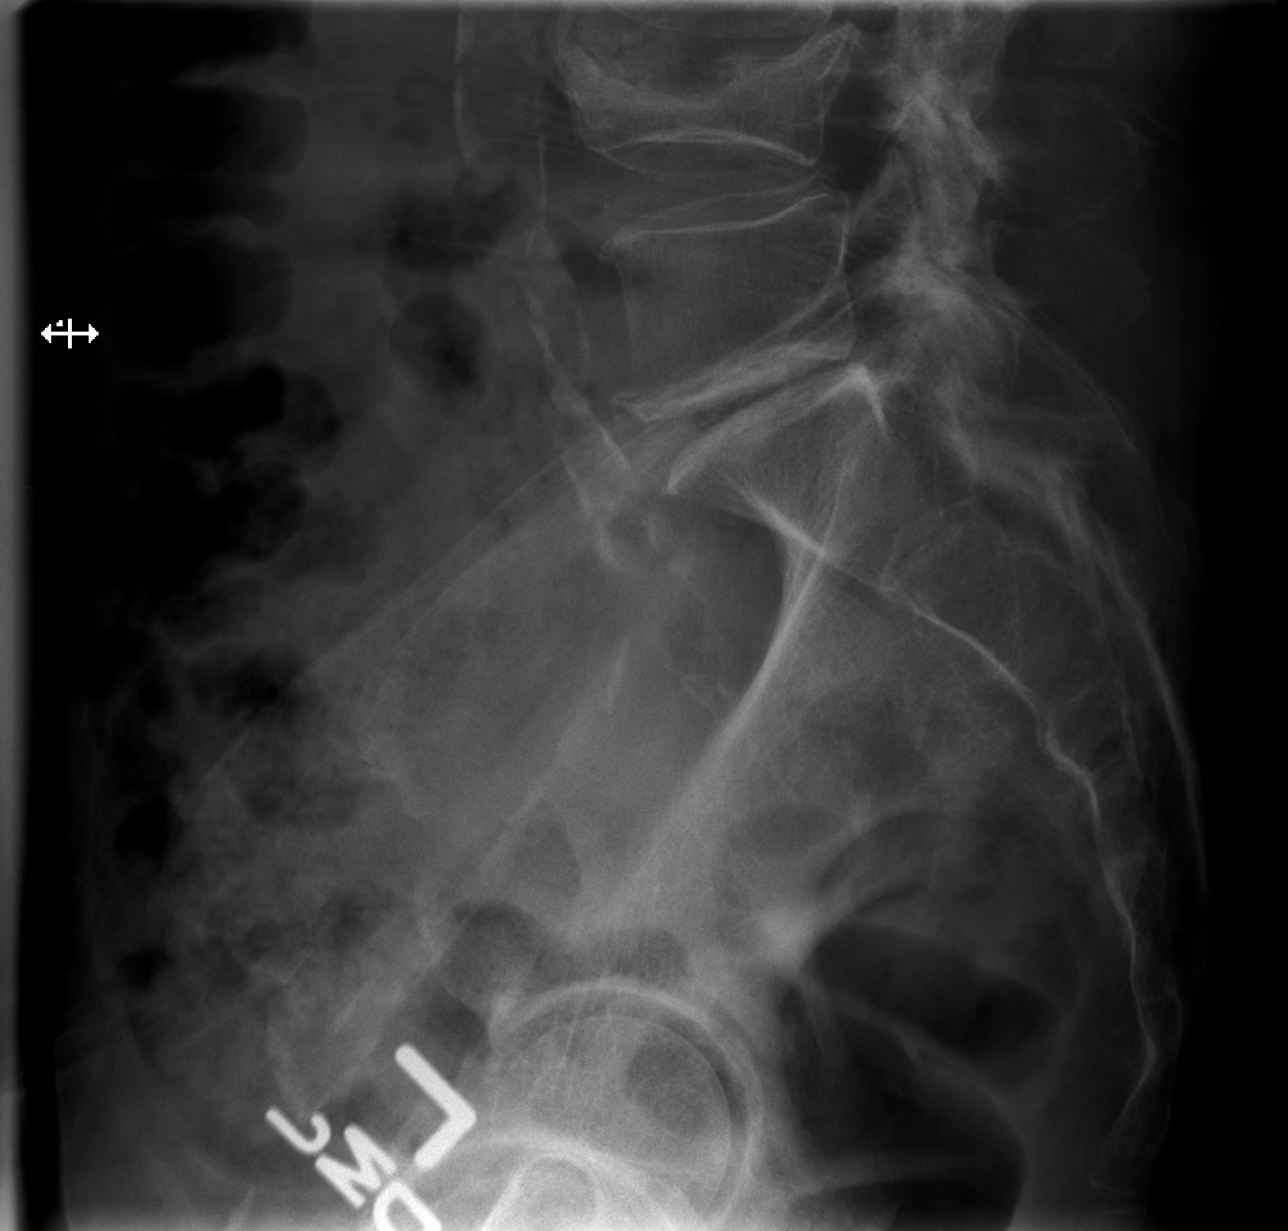

[5 of 5 positions shown; findings below may reference images not displayed]

FINDINGS: Five views of the lumbar spine again demonstrated an old burst
fracture of L4 which appears similar to the prior CT examination the
11/18/2013, with up to 60% loss of central vertebral body height.
There is approximately 5 mm of retropulsion of posterior vertebral
body fragments at L4 slightly narrowing the central spinal canal
this level, better demonstrated on prior CT scan. No new acute
displaced fractures or new compression type fractures are otherwise
noted. Multilevel degenerative disc disease and facet arthropathy,
most severe at L5-S1. Severe atherosclerosis. Pacemaker leads seen
projecting over the lower thorax.
IMPRESSION: 1. No acute radiographic abnormality of the lumbar spine.
2. Old burst fracture of L4 appear similar to prior examinations, as
above.
3. Multilevel degenerative disc disease and lumbar spondylosis, as
above.
4. Atherosclerosis.

## 2016-02-27 LAB — GLUCOSE, CAPILLARY
GLUCOSE-CAPILLARY: 160 mg/dL — AB (ref 65–99)
GLUCOSE-CAPILLARY: 96 mg/dL (ref 65–99)
GLUCOSE-CAPILLARY: 93 mg/dL (ref 65–99)
GLUCOSE-CAPILLARY: 107 mg/dL — AB (ref 65–99)
Glucose-Capillary: 195 mg/dL — ABNORMAL HIGH (ref 65–99)

## 2016-02-28 LAB — GLUCOSE, CAPILLARY
GLUCOSE-CAPILLARY: 189 mg/dL — AB (ref 65–99)
Glucose-Capillary: 98 mg/dL (ref 65–99)
Glucose-Capillary: 108 mg/dL — ABNORMAL HIGH (ref 65–99)
Glucose-Capillary: 140 mg/dL — ABNORMAL HIGH (ref 65–99)

## 2016-02-29 LAB — GLUCOSE, CAPILLARY
GLUCOSE-CAPILLARY: 89 mg/dL (ref 65–99)
GLUCOSE-CAPILLARY: 97 mg/dL (ref 65–99)
GLUCOSE-CAPILLARY: 151 mg/dL — AB (ref 65–99)

## 2016-03-01 LAB — GLUCOSE, CAPILLARY
GLUCOSE-CAPILLARY: 141 mg/dL — AB (ref 65–99)
GLUCOSE-CAPILLARY: 90 mg/dL (ref 65–99)
Glucose-Capillary: 159 mg/dL — ABNORMAL HIGH (ref 65–99)

## 2016-03-02 LAB — GLUCOSE, CAPILLARY
GLUCOSE-CAPILLARY: 109 mg/dL — AB (ref 65–99)
GLUCOSE-CAPILLARY: 132 mg/dL — AB (ref 65–99)
GLUCOSE-CAPILLARY: 136 mg/dL — AB (ref 65–99)
Glucose-Capillary: 110 mg/dL — ABNORMAL HIGH (ref 65–99)

## 2016-03-03 LAB — GLUCOSE, CAPILLARY
Glucose-Capillary: 100 mg/dL — ABNORMAL HIGH (ref 65–99)
Glucose-Capillary: 163 mg/dL — ABNORMAL HIGH (ref 65–99)
Glucose-Capillary: 134 mg/dL — ABNORMAL HIGH (ref 65–99)

## 2016-03-04 LAB — GLUCOSE, CAPILLARY
Glucose-Capillary: 92 mg/dL (ref 65–99)
Glucose-Capillary: 99 mg/dL (ref 65–99)
Glucose-Capillary: 105 mg/dL — ABNORMAL HIGH (ref 65–99)
Glucose-Capillary: 119 mg/dL — ABNORMAL HIGH (ref 65–99)

## 2016-03-23 IMAGING — CR DG KNEE COMPLETE 4+V*L*
1 series · 5 of 5 positions shown · non-contrast
Comparison: [DATE]

CLINICAL DATA: Initial evaluation for pain left knee after fall at
[HOSPITAL] this evening

EXAM:
LEFT KNEE - COMPLETE 4+ VIEW

[Series 1: t knee ap left · 0.14mm/px · 5 of 5 slices shown]
[im 1/5]
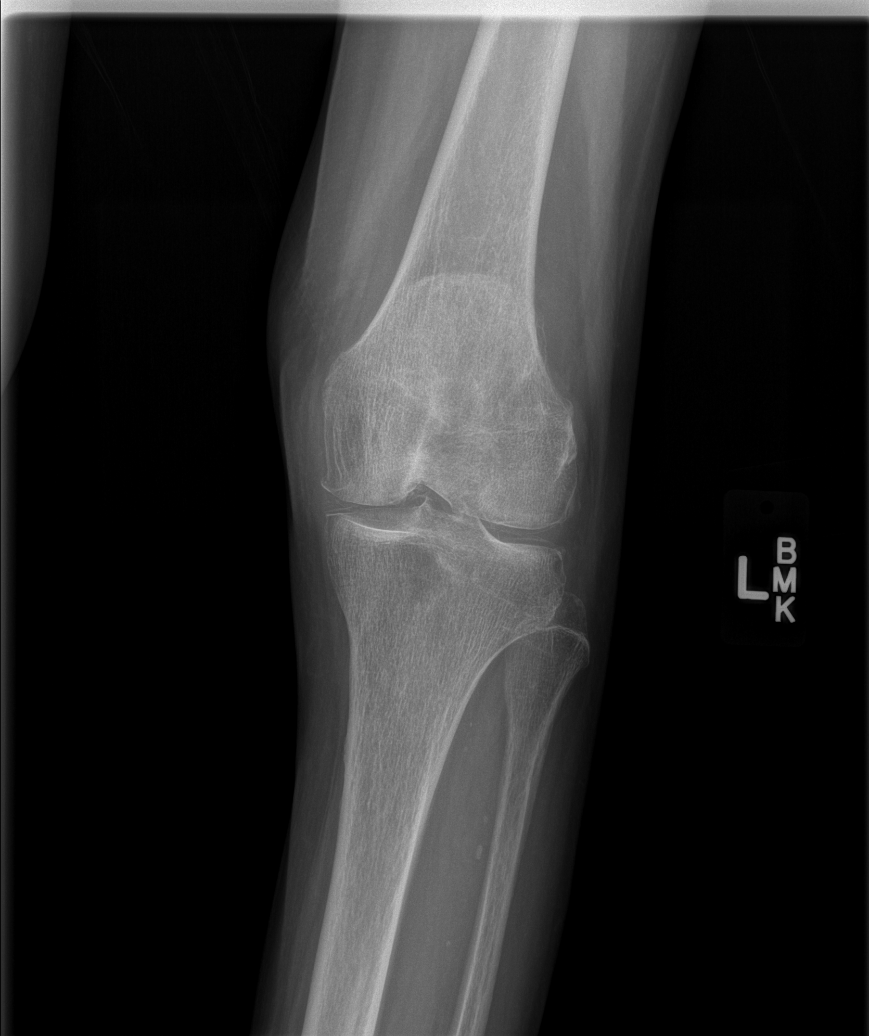
[im 2/5]
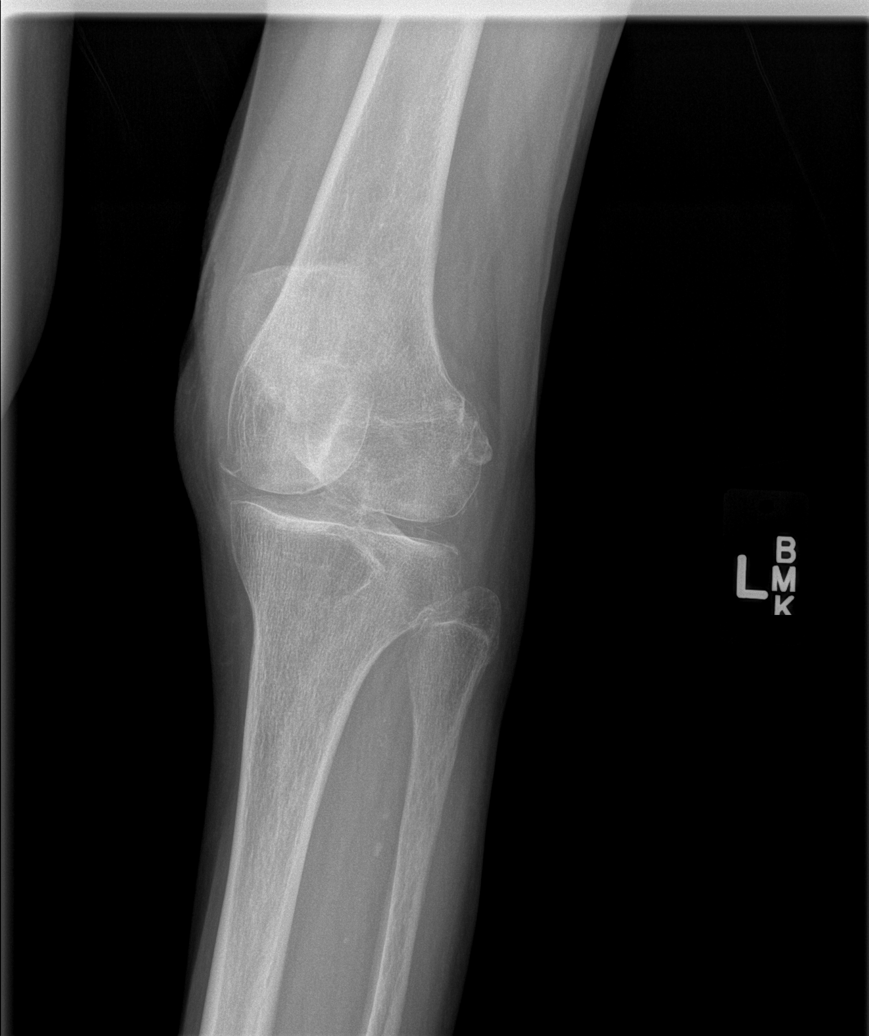
[im 3/5]
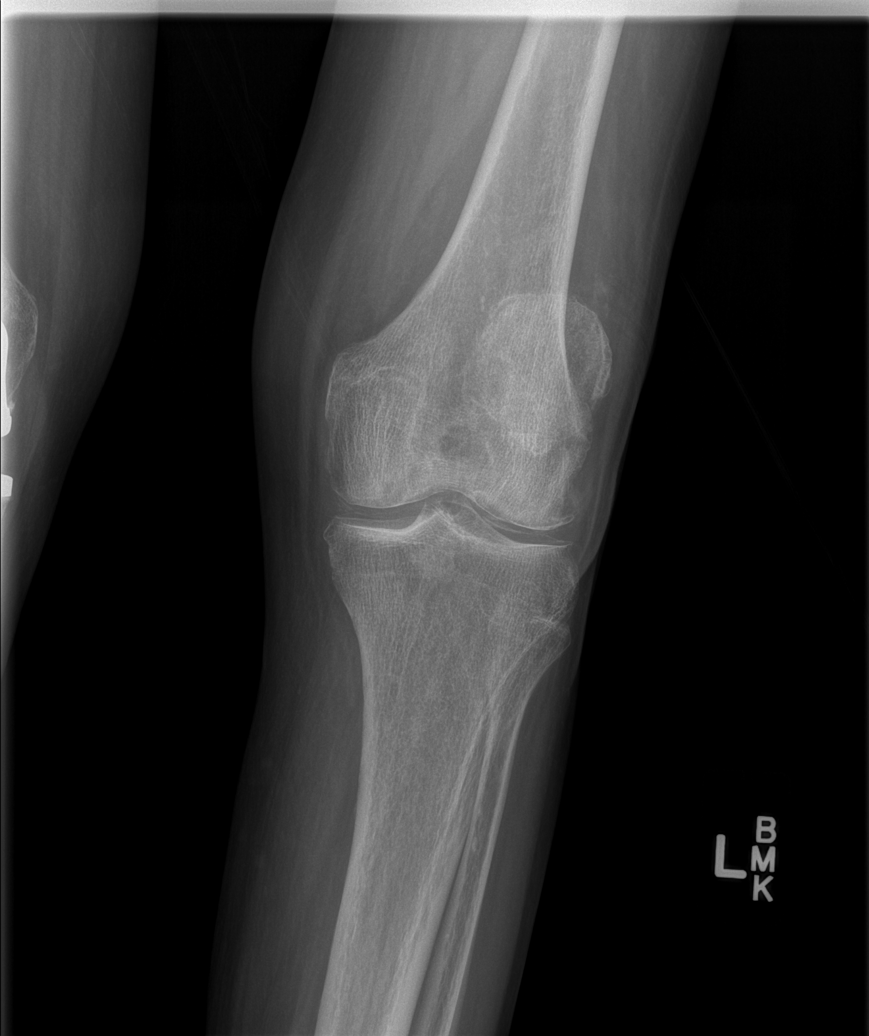
[im 4/5]
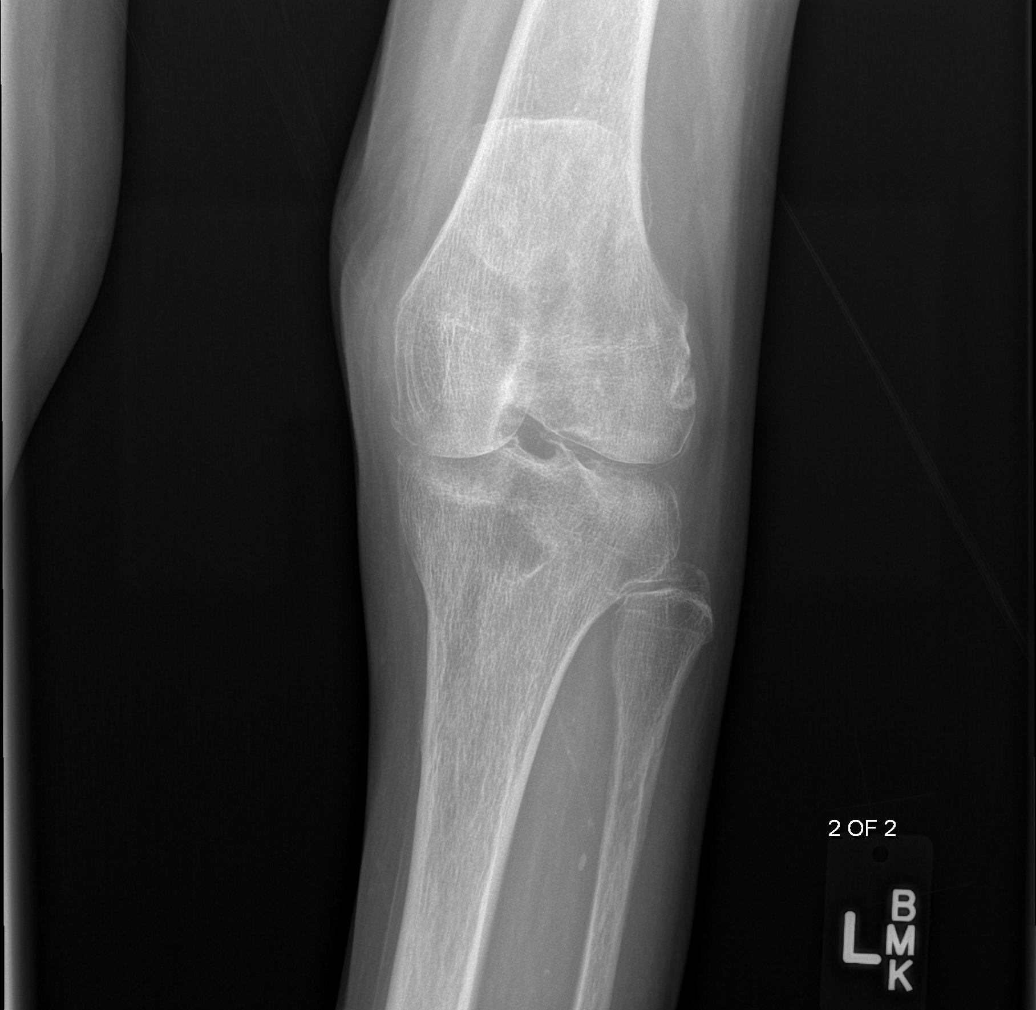
[im 5/5]
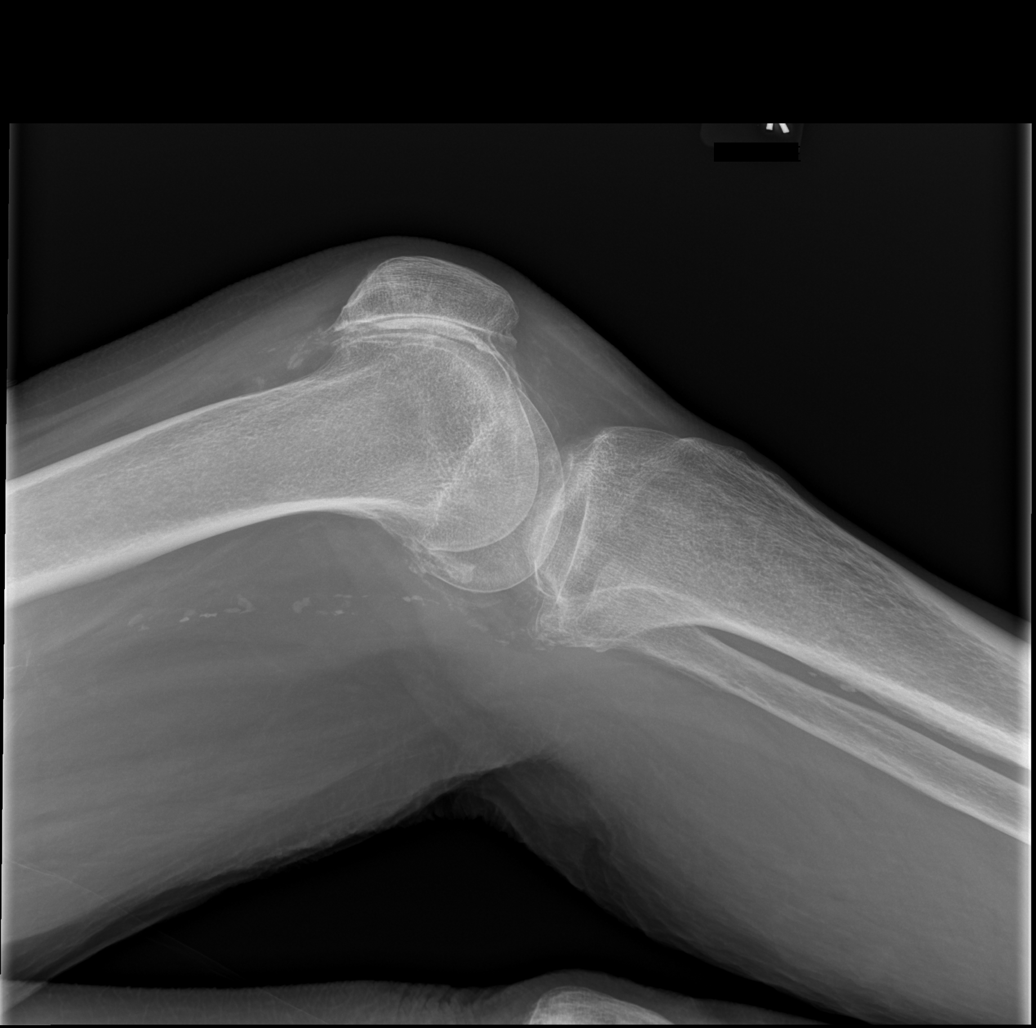

[5 of 5 positions shown; findings below may reference images not displayed]

FINDINGS: Diffuse osteopenia. No fracture or dislocation. Tricompartmental
arthritis. No joint effusion.
IMPRESSION: No acute findings

## 2016-03-23 IMAGING — CT CT HEAD WITHOUT CONTRAST
4 of 5 series · 16 of 33 positions shown, 18 images · non-contrast
Comparison: CT of the head and cervical spine August 29, 2014

CLINICAL DATA: [HOSPITAL] patient, fall with neck and back pain.

EXAM:
CT HEAD WITHOUT CONTRAST
CT CERVICAL SPINE WITHOUT CONTRAST
TECHNIQUE: Multidetector CT imaging of the head and cervical spine was
performed following the standard protocol without intravenous
contrast. Multiplanar CT image reconstructions of the cervical spine
were also generated.

[Series 5: c spine soft · axial · 0.31mm/px · z∈[-284,-194]mm · 4 of 76 slices shown]
[im 16/76  soft-tissue]
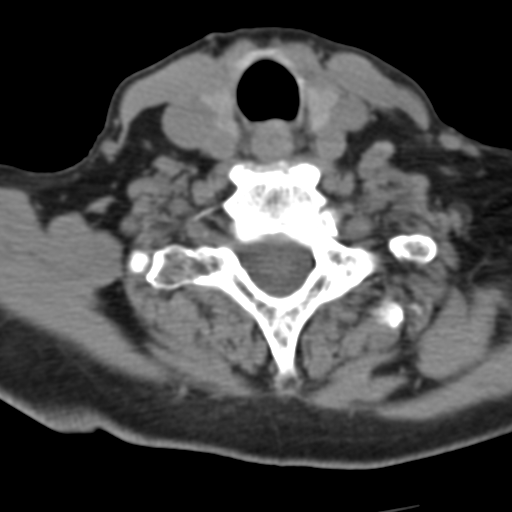
[im 31/76  soft-tissue]
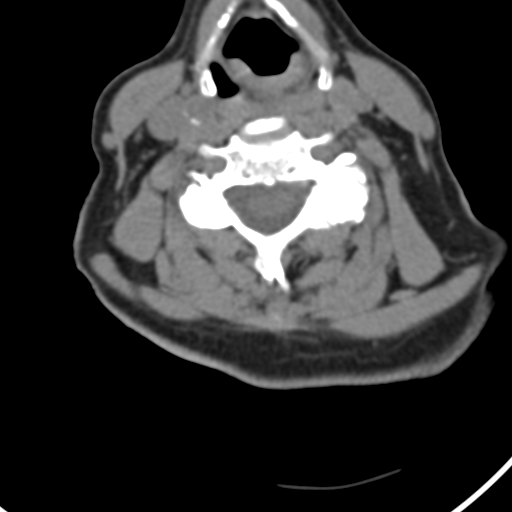
[im 46/76  soft-tissue]
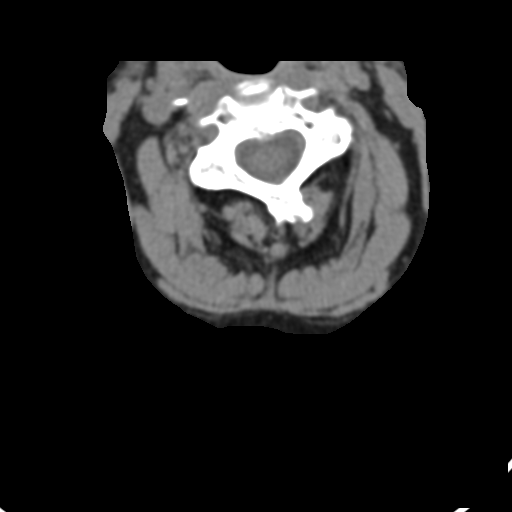
[im 61/76  soft-tissue]
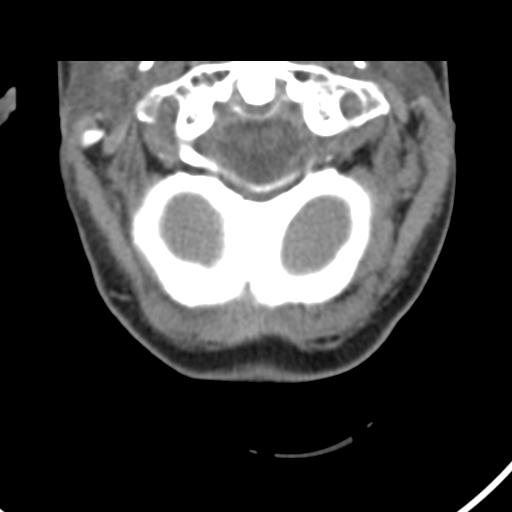

[Series 6: sag bone · sagittal · 0.33mm/px · 5 of 41 slices shown, 6 images]
[im 14/41  bone]
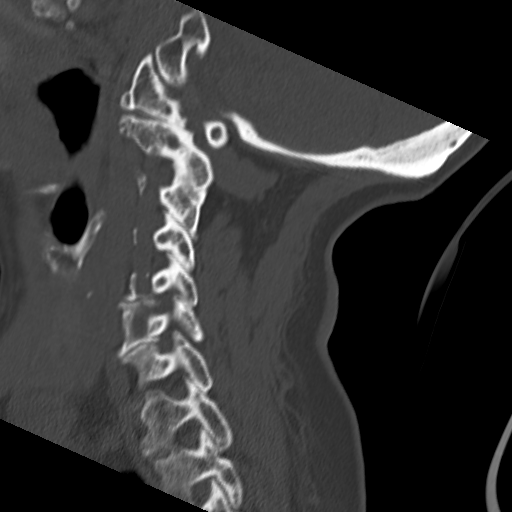
[im 17/41  bone]
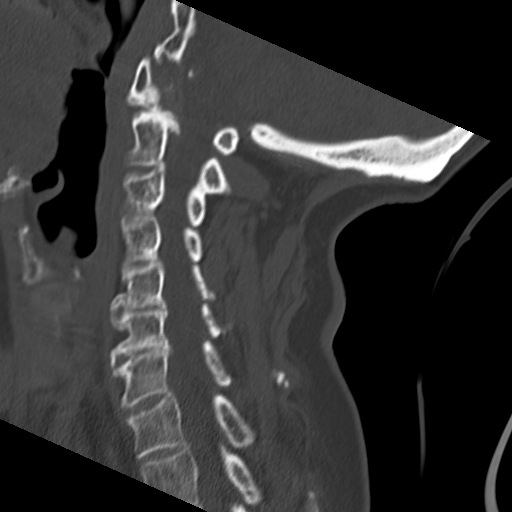
[im 21/41  soft-tissue]
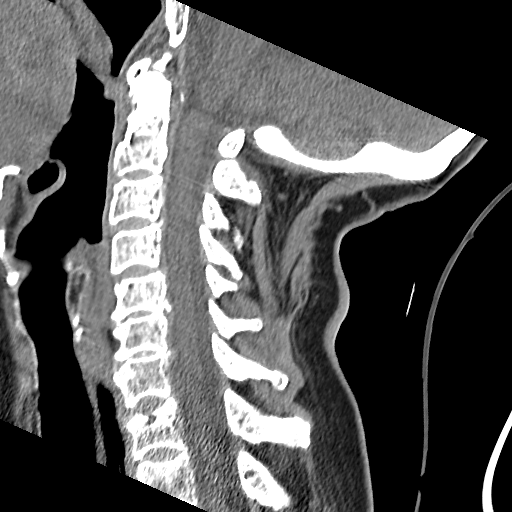
[im 21/41  bone]
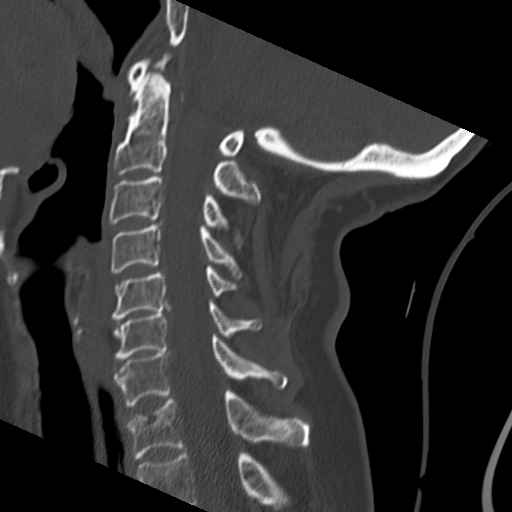
[im 24/41  bone]
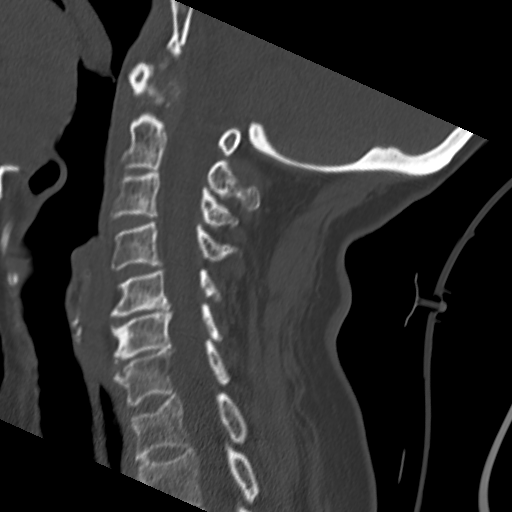
[im 27/41  bone]
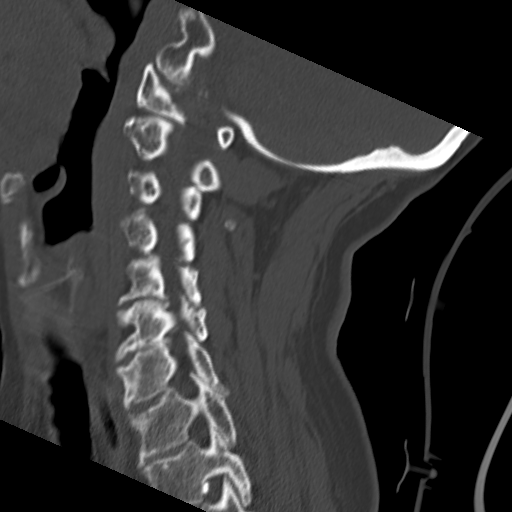

[Series 7: cor bone · coronal · 0.32mm/px · 3 of 36 slices shown]
[im 8/36  bone]
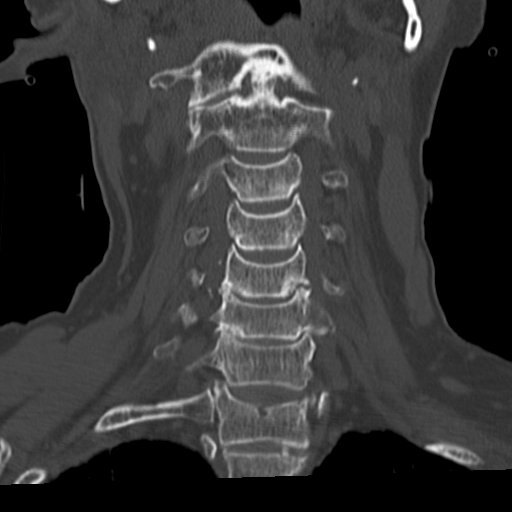
[im 15/36  bone]
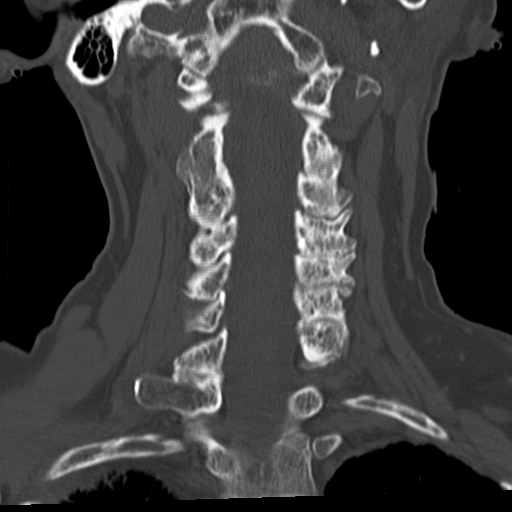
[im 22/36  bone]
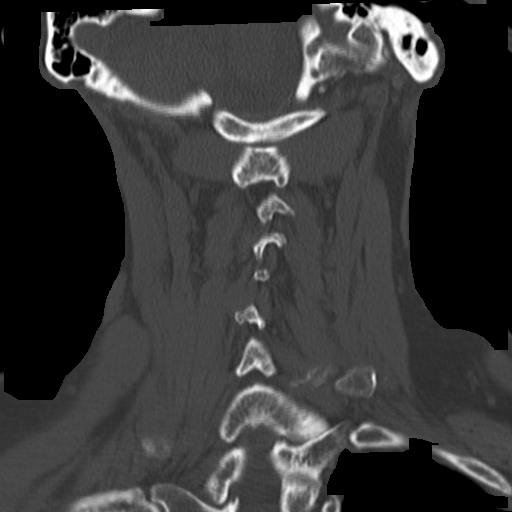

[Series 8: orthogonal axials · axial · 0.29mm/px · z∈[-308,-228]mm · 4 of 77 slices shown, 5 images]
[im 16/77  soft-tissue]
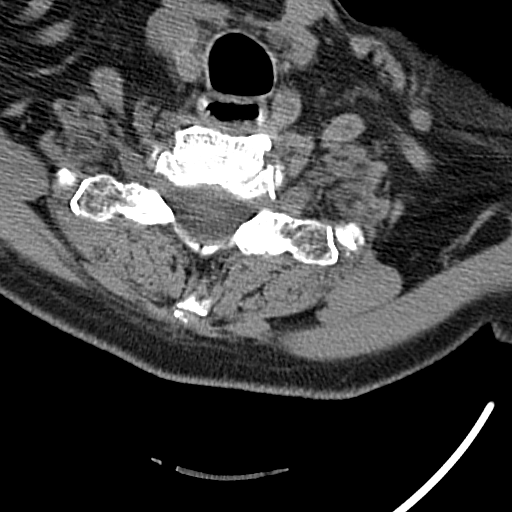
[im 16/77  bone]
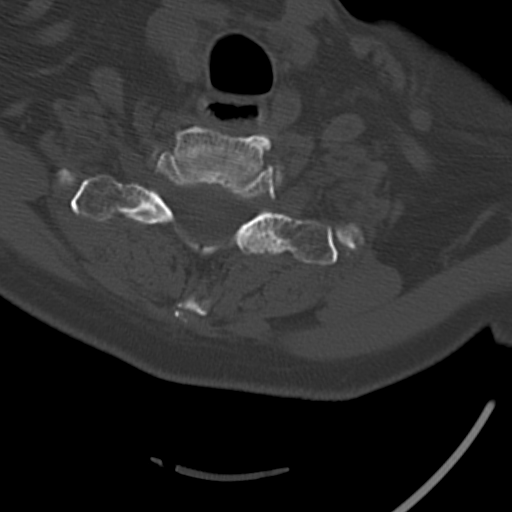
[im 31/77  bone]
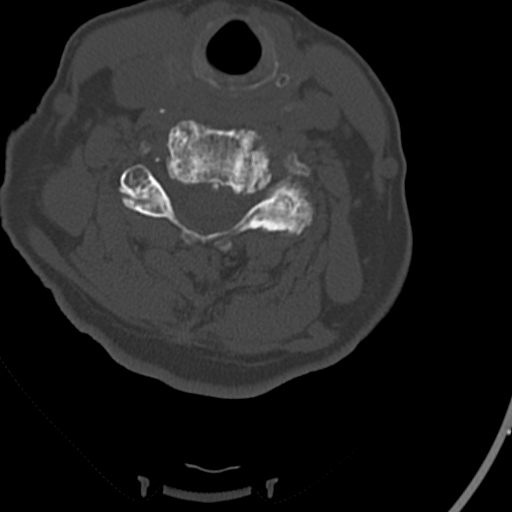
[im 46/77  bone]
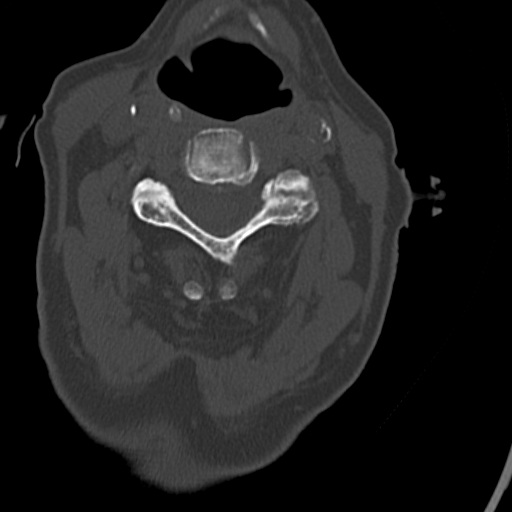
[im 61/77  bone]
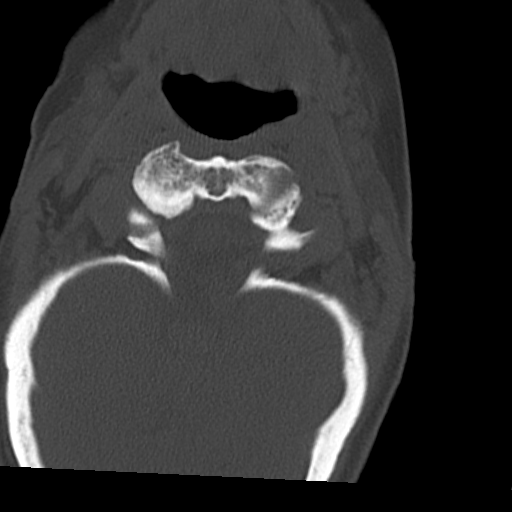

[16 of 33 positions shown; findings below may reference images not displayed]

FINDINGS: CT HEAD FINDINGS

The ventricles and sulci are normal for age. No intraparenchymal
hemorrhage, mass effect nor midline shift. Confluent supratentorial
white matter hypodensities are unchanged. No acute large vascular
territory infarcts.

No abnormal extra-axial fluid collections. Basal cisterns are
patent. Moderate calcific atherosclerosis of the carotid siphons and
included vertebral arteries.

No skull fracture. Moderate LEFT parietal scalp hematoma without
subcutaneous gas or radiopaque foreign bodies. The included ocular
globes and orbital contents are non-suspicious. Mild paranasal sinus
mucosal thickening without air-fluid levels. Severe RIGHT and
moderate LEFT temporomandibular osteoarthrosis. Patient is
edentulous.

CT CERVICAL SPINE FINDINGS

Cervical vertebral bodies and posterior elements are intact and
aligned and maintenance of cervical lordosis. Moderate to severe
C5-6 and C6-7 degenerative discs. Severe C1-2 osteoarthrosis,
articulation maintained. Calcified apical ligament. Severe LEFT
facet arthropathy. Bone mineral density is decreased without
destructive bony lesions. Stable mild T1 compression deformity with
superior endplate Schmorl's node. Moderate calcific atherosclerosis
of the carotid bulbs. 8 mm RIGHT thyroid hypodensity below size
surveillance recommendations.
IMPRESSION: CT HEAD: Moderate LEFT parietal scalp hematoma. No skull fracture.
No acute intracranial process.

Involutional changes. Severe white matter changes may be seen with
chronic small vessel ischemic disease.

CT CERVICAL SPINE:  No acute fracture nor malalignment.

Similar degenerative change of the cervical spine.

  By: Makeda Kalyan

## 2016-04-02 ENCOUNTER — Telehealth: Payer: Self-pay | Admitting: *Deleted

## 2016-04-02 NOTE — Telephone Encounter (Signed)
For: OFFICE               Taken 23-MAY-17 at  4:14PM by [facility] ------------------------------------------------------------ Amador CunasCaller SHAR[facility]      CID [id number]  Patient [name] [name]             [address] [place]          Area Code 828 Phone# [phone] * DOB 03/15/1936      RE HOW IMPORTANT IS IT THAT SHE GOES TO HER          UPCOMING APPT,PT IS IN NURSING HOME & IN HOSPICE     Disp:Y/N N If Y = C/B If No Response In 20minutes ============================================================

## 2016-04-02 NOTE — Telephone Encounter (Signed)
Spoke to ToysRusSherri (dgt on [employer]) who states her mother in now under Hospice care and asked that her appointment be canceled this month.

## 2016-04-04 IMAGING — CT CT HEAD WITHOUT CONTRAST
3 series · 11 of 27 positions shown, 12 images · non-contrast
Comparison: 09/24/2014

CLINICAL DATA: Neck pain after fall from bed.  Initial encounter

EXAM:
CT HEAD WITHOUT CONTRAST
CT CERVICAL SPINE WITHOUT CONTRAST
TECHNIQUE: Multidetector CT imaging of the head and cervical spine was
performed following the standard protocol without intravenous
contrast. Multiplanar CT image reconstructions of the cervical spine
were also generated.

[Series 2: head wo · axial · 0.43mm/px · z∈[-602,-556]mm · 2 of 32 slices shown, 3 images]
[im 11/32  soft-tissue]
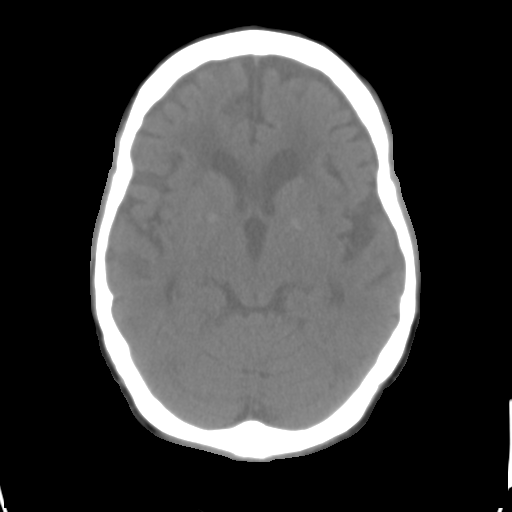
[im 11/32  bone]
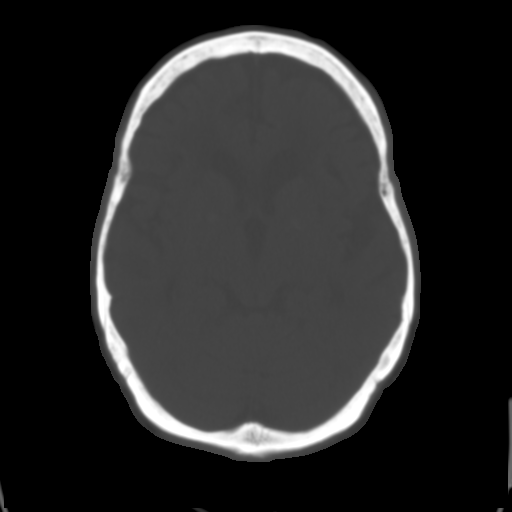
[im 21/32  soft-tissue]
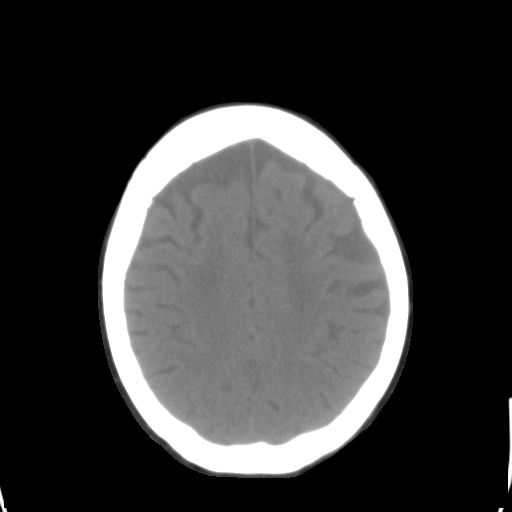

[Series 5: c spine soft · axial · 0.37mm/px · z∈[-788,-676]mm · 8 of 72 slices shown]
[im 8/72  soft-tissue]
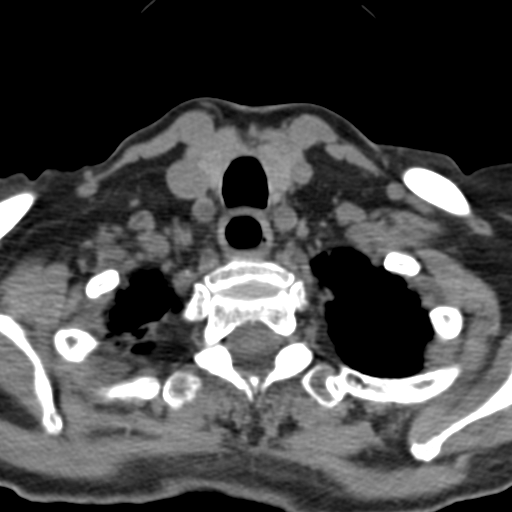
[im 16/72  soft-tissue]
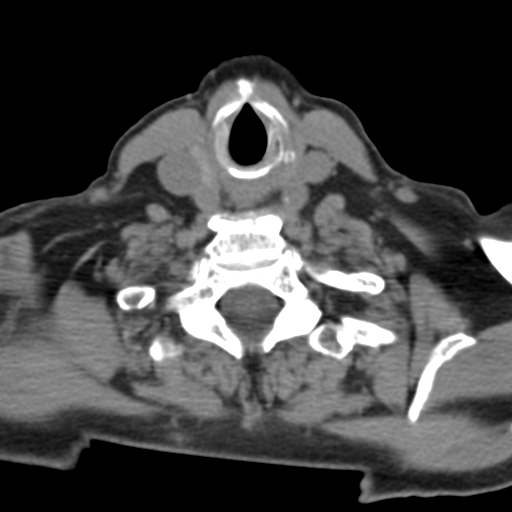
[im 24/72  soft-tissue]
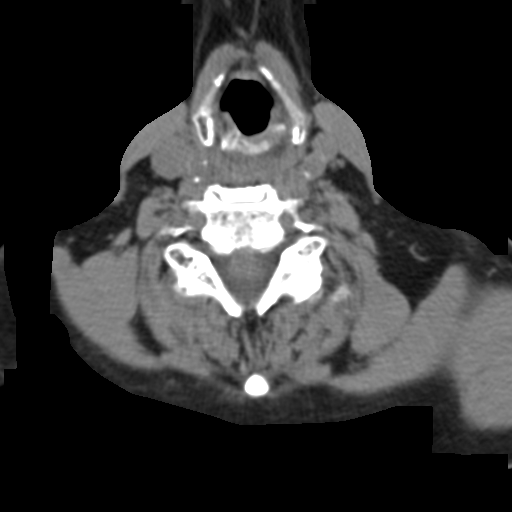
[im 32/72  soft-tissue]
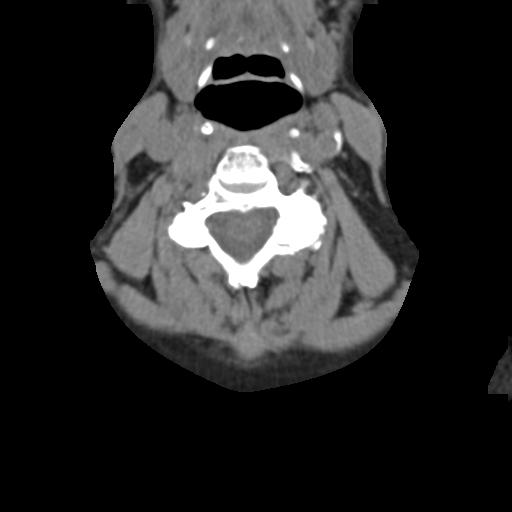
[im 40/72  soft-tissue]
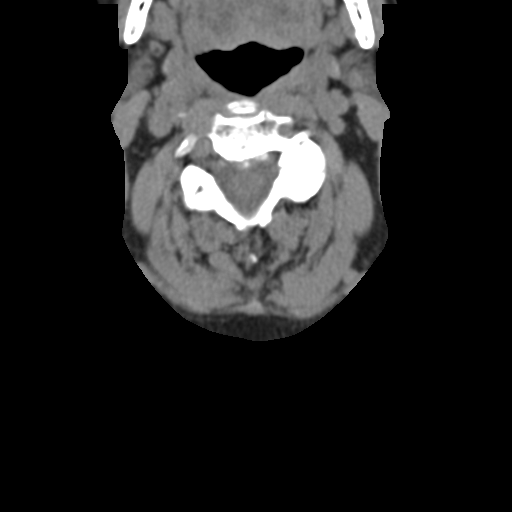
[im 48/72  soft-tissue]
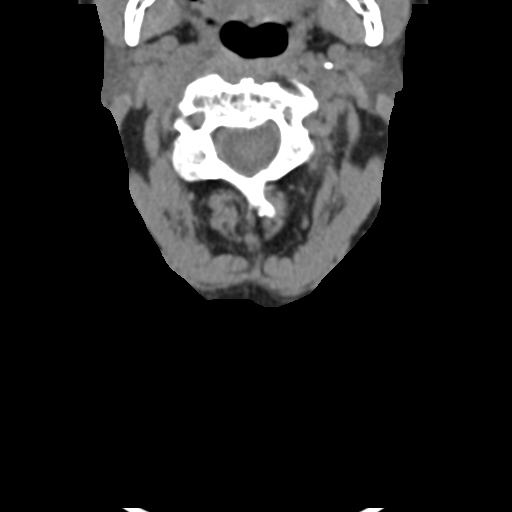
[im 56/72  soft-tissue]
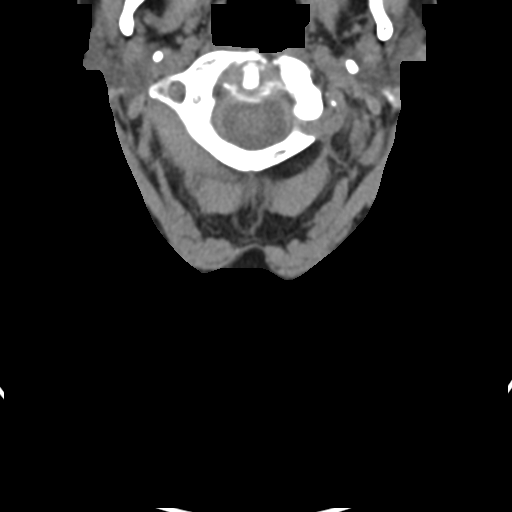
[im 64/72  soft-tissue]
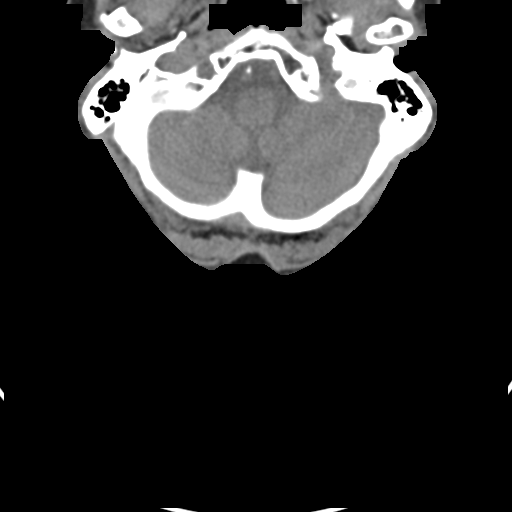

[Series 6: sag bone · sagittal · 0.37mm/px · 1 of 41 slices shown]
[im 21/41  soft-tissue]
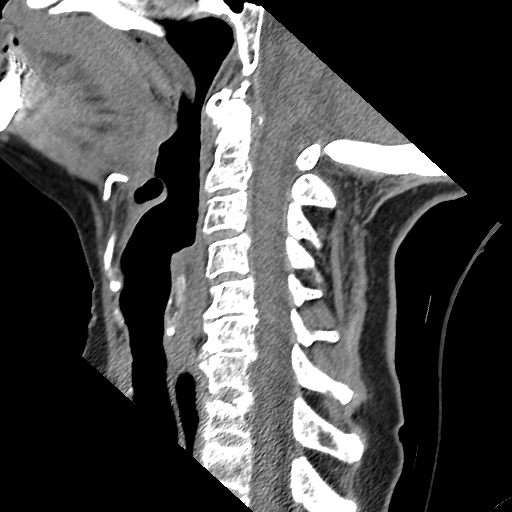

[11 of 27 positions shown; findings below may reference images not displayed]

FINDINGS: CT HEAD FINDINGS

Skull and Sinuses:Negative for fracture or destructive process. The
mastoids, middle ears, and imaged paranasal sinuses are clear.

Orbits: No acute abnormality.

Brain: No evidence of acute abnormality, such as acute infarction,
hemorrhage, hydrocephalus, or mass lesion/mass effect.

An approximately 12 mm thick area of high attenuation along the
lateral left temporal pole is considered streak artifact when
correlated with multiple recent head CT examinations. No neighboring
fracture to suggest this represents an epidural hematoma.

Generalized brain atrophy. There is moderate chronic small vessel
disease with patchy ischemic gliosis throughout the bilateral
anterior cerebral white matter.

Dystrophic calcification within a sulcus at the right vertex.

CT CERVICAL SPINE FINDINGS

No evidence of acute fracture or traumatic malalignment. There is
slight C4-5 anterolisthesis which is chronic and degenerative.
Remote T1 superior endplate fracture with unchanged height loss.
Facet degeneration is diffuse and severe, left more than right.
Degenerative disc disease is diffuse, with spurring most prominent
in the mid and lower cervical spine. No evidence of significant
canal stenosis. No prevertebral edema or gross cervical canal
hematoma. No evidence of acute soft tissue injury in the neck.
IMPRESSION: 1. No evidence of intracranial or cervical spine injury.
2. Brain atrophy and chronic small vessel disease.
3. Diffuse cervical spondylosis.

## 2016-04-10 ENCOUNTER — Ambulatory Visit: Payer: Medicare Other | Admitting: Nurse Practitioner

## 2016-04-11 ENCOUNTER — Encounter
Admission: RE | Admit: 2016-04-11 | Discharge: 2016-04-11 | Disposition: A | Discharge status: Home / Self Care | Source: Ambulatory Visit | Attending: Internal Medicine | Admitting: Internal Medicine

## 2016-04-11 DIAGNOSIS — R410 Disorientation, unspecified: Secondary | ICD-10-CM | POA: Insufficient documentation

## 2016-04-11 LAB — GLUCOSE, CAPILLARY
GLUCOSE-CAPILLARY: 92 mg/dL (ref 65–99)
GLUCOSE-CAPILLARY: 183 mg/dL — AB (ref 65–99)

## 2016-04-12 DIAGNOSIS — R410 Disorientation, unspecified: Secondary | ICD-10-CM | POA: Diagnosis not present

## 2016-04-12 LAB — URINALYSIS COMPLETE WITH MICROSCOPIC (ARMC ONLY)
BILIRUBIN URINE: NEGATIVE
GLUCOSE, UA: NEGATIVE mg/dL
HGB URINE DIPSTICK: NEGATIVE
KETONES UR: NEGATIVE mg/dL
NITRITE: POSITIVE — AB
Protein, ur: NEGATIVE mg/dL
SPECIFIC GRAVITY, URINE: 1.008 (ref 1.005–1.030)
Squamous Epithelial / LPF: NONE SEEN
pH: 6 (ref 5.0–8.0)

## 2016-04-12 LAB — GLUCOSE, CAPILLARY
GLUCOSE-CAPILLARY: 100 mg/dL — AB (ref 65–99)
Glucose-Capillary: 147 mg/dL — ABNORMAL HIGH (ref 65–99)

## 2016-04-13 LAB — GLUCOSE, CAPILLARY
GLUCOSE-CAPILLARY: 130 mg/dL — AB (ref 65–99)
GLUCOSE-CAPILLARY: 115 mg/dL — AB (ref 65–99)
Glucose-Capillary: 90 mg/dL (ref 65–99)
Glucose-Capillary: 103 mg/dL — ABNORMAL HIGH (ref 65–99)

## 2016-04-14 LAB — GLUCOSE, CAPILLARY
GLUCOSE-CAPILLARY: 103 mg/dL — AB (ref 65–99)
Glucose-Capillary: 98 mg/dL (ref 65–99)
Glucose-Capillary: 96 mg/dL (ref 65–99)
Glucose-Capillary: 178 mg/dL — ABNORMAL HIGH (ref 65–99)

## 2016-04-14 LAB — URINE CULTURE: Culture: >=100000 — AB

## 2016-04-15 LAB — GLUCOSE, CAPILLARY
GLUCOSE-CAPILLARY: 145 mg/dL — AB (ref 65–99)
GLUCOSE-CAPILLARY: 147 mg/dL — AB (ref 65–99)
Glucose-Capillary: 94 mg/dL (ref 65–99)
Glucose-Capillary: 132 mg/dL — ABNORMAL HIGH (ref 65–99)

## 2016-04-16 LAB — GLUCOSE, CAPILLARY
GLUCOSE-CAPILLARY: 148 mg/dL — AB (ref 65–99)
GLUCOSE-CAPILLARY: 95 mg/dL (ref 65–99)
GLUCOSE-CAPILLARY: 144 mg/dL — AB (ref 65–99)
Glucose-Capillary: 113 mg/dL — ABNORMAL HIGH (ref 65–99)

## 2016-04-17 LAB — GLUCOSE, CAPILLARY
GLUCOSE-CAPILLARY: 87 mg/dL (ref 65–99)
GLUCOSE-CAPILLARY: 178 mg/dL — AB (ref 65–99)
GLUCOSE-CAPILLARY: 95 mg/dL (ref 65–99)
Glucose-Capillary: 178 mg/dL — ABNORMAL HIGH (ref 65–99)

## 2016-04-18 LAB — GLUCOSE, CAPILLARY
GLUCOSE-CAPILLARY: 138 mg/dL — AB (ref 65–99)
GLUCOSE-CAPILLARY: 158 mg/dL — AB (ref 65–99)
Glucose-Capillary: 98 mg/dL (ref 65–99)
Glucose-Capillary: 92 mg/dL (ref 65–99)

## 2016-04-19 LAB — GLUCOSE, CAPILLARY
GLUCOSE-CAPILLARY: 97 mg/dL (ref 65–99)
Glucose-Capillary: 67 mg/dL (ref 65–99)
Glucose-Capillary: 144 mg/dL — ABNORMAL HIGH (ref 65–99)

## 2016-04-20 LAB — GLUCOSE, CAPILLARY
GLUCOSE-CAPILLARY: 147 mg/dL — AB (ref 65–99)
Glucose-Capillary: 107 mg/dL — ABNORMAL HIGH (ref 65–99)
Glucose-Capillary: 125 mg/dL — ABNORMAL HIGH (ref 65–99)
Glucose-Capillary: 215 mg/dL — ABNORMAL HIGH (ref 65–99)

## 2016-04-21 LAB — GLUCOSE, CAPILLARY
GLUCOSE-CAPILLARY: 126 mg/dL — AB (ref 65–99)
GLUCOSE-CAPILLARY: 87 mg/dL (ref 65–99)
GLUCOSE-CAPILLARY: 152 mg/dL — AB (ref 65–99)

## 2016-04-22 LAB — GLUCOSE, CAPILLARY
GLUCOSE-CAPILLARY: 127 mg/dL — AB (ref 65–99)
GLUCOSE-CAPILLARY: 202 mg/dL — AB (ref 65–99)
Glucose-Capillary: 124 mg/dL — ABNORMAL HIGH (ref 65–99)

## 2016-04-23 LAB — GLUCOSE, CAPILLARY
GLUCOSE-CAPILLARY: 103 mg/dL — AB (ref 65–99)
Glucose-Capillary: 92 mg/dL (ref 65–99)
Glucose-Capillary: 159 mg/dL — ABNORMAL HIGH (ref 65–99)

## 2016-04-24 LAB — GLUCOSE, CAPILLARY
GLUCOSE-CAPILLARY: 96 mg/dL (ref 65–99)
Glucose-Capillary: 105 mg/dL — ABNORMAL HIGH (ref 65–99)
Glucose-Capillary: 99 mg/dL (ref 65–99)

## 2016-04-25 LAB — GLUCOSE, CAPILLARY
GLUCOSE-CAPILLARY: 97 mg/dL (ref 65–99)
GLUCOSE-CAPILLARY: 98 mg/dL (ref 65–99)
Glucose-Capillary: 141 mg/dL — ABNORMAL HIGH (ref 65–99)

## 2016-04-26 LAB — GLUCOSE, CAPILLARY
GLUCOSE-CAPILLARY: 113 mg/dL — AB (ref 65–99)
Glucose-Capillary: 135 mg/dL — ABNORMAL HIGH (ref 65–99)
Glucose-Capillary: 107 mg/dL — ABNORMAL HIGH (ref 65–99)

## 2016-04-27 LAB — GLUCOSE, CAPILLARY
Glucose-Capillary: 112 mg/dL — ABNORMAL HIGH (ref 65–99)
Glucose-Capillary: 169 mg/dL — ABNORMAL HIGH (ref 65–99)

## 2016-04-27 IMAGING — CT CT CERVICAL SPINE WITHOUT CONTRAST
2 of 3 series · 10 of 27 positions shown, 13 images · non-contrast
Comparison: October 06, 2014

CLINICAL DATA: Several recent falls

EXAM:
CT HEAD WITHOUT CONTRAST
CT CERVICAL SPINE WITHOUT CONTRAST
TECHNIQUE: Multidetector CT imaging of the head and cervical spine was
performed following the standard protocol without intravenous
contrast. Multiplanar CT image reconstructions of the cervical spine
were also generated.

[Series 4: c spine soft · axial · 0.38mm/px · z∈[-290,-182]mm · 5 of 70 slices shown, 7 images]
[im 8/70  soft-tissue]
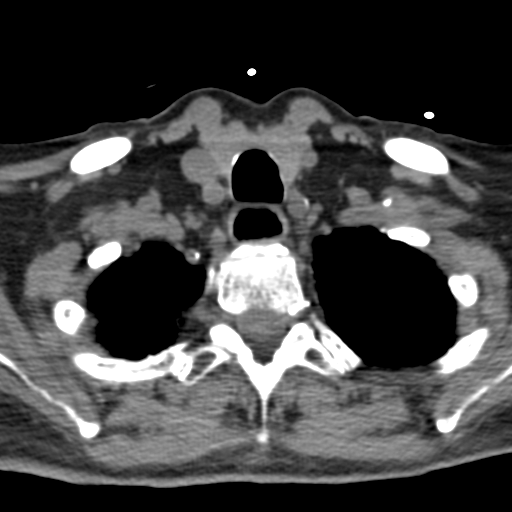
[im 8/70  bone]
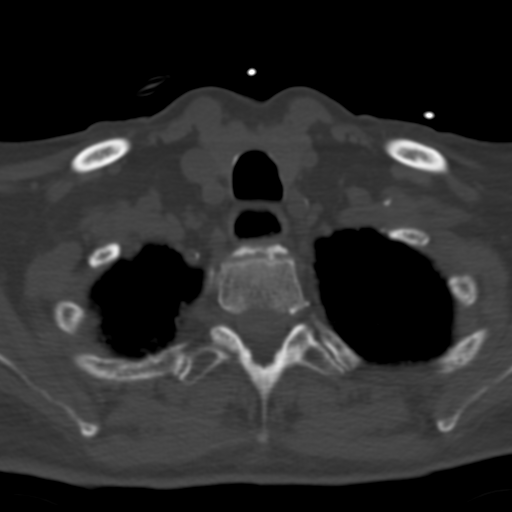
[im 24/70  bone]
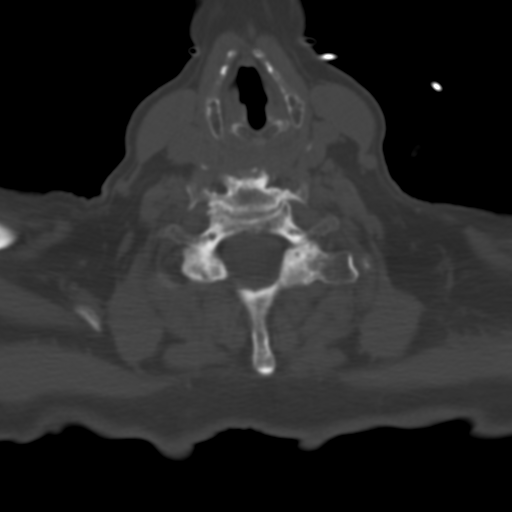
[im 39/70  bone]
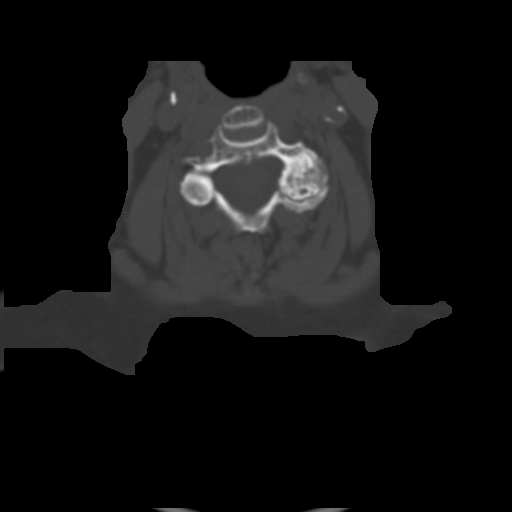
[im 47/70  bone]
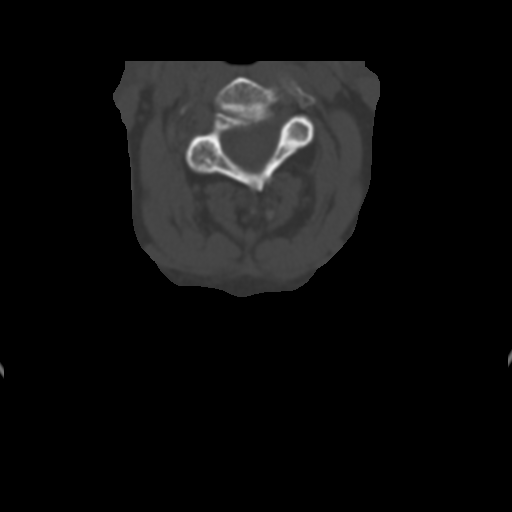
[im 62/70  soft-tissue]
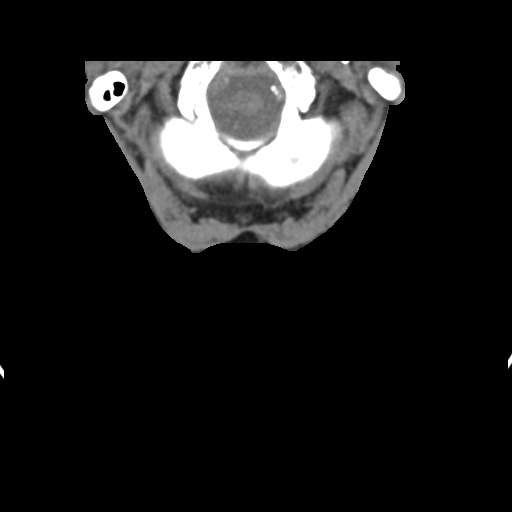
[im 62/70  bone]
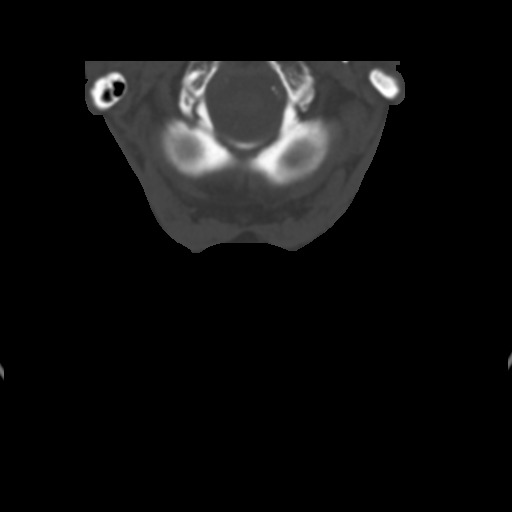

[Series 5: sag bone · sagittal · 0.19mm/px · 5 of 48 slices shown, 6 images]
[im 16/48  bone]
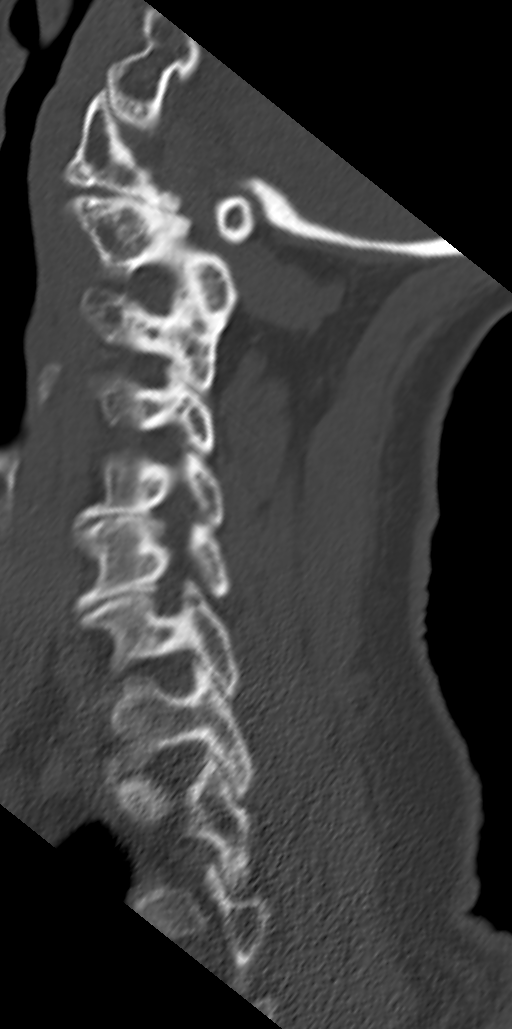
[im 20/48  bone]
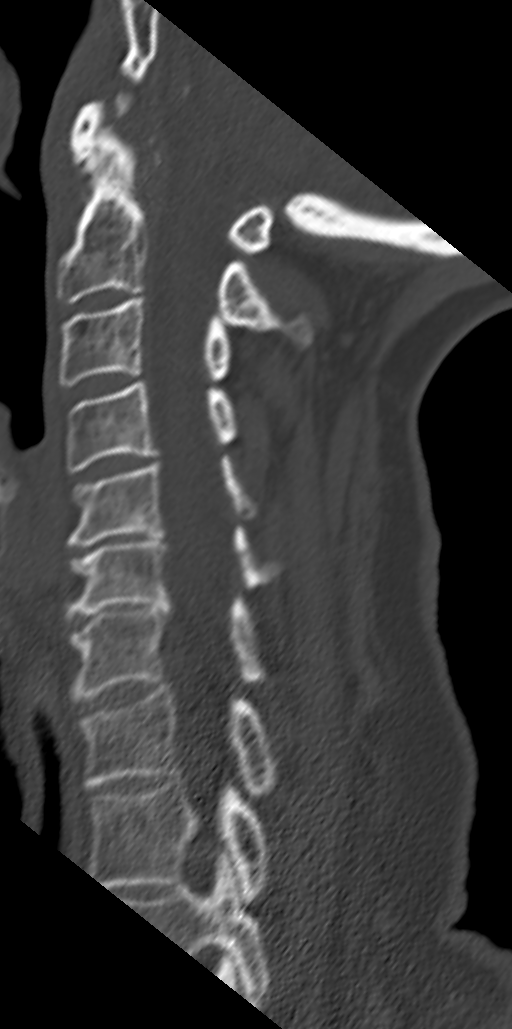
[im 24/48  soft-tissue]
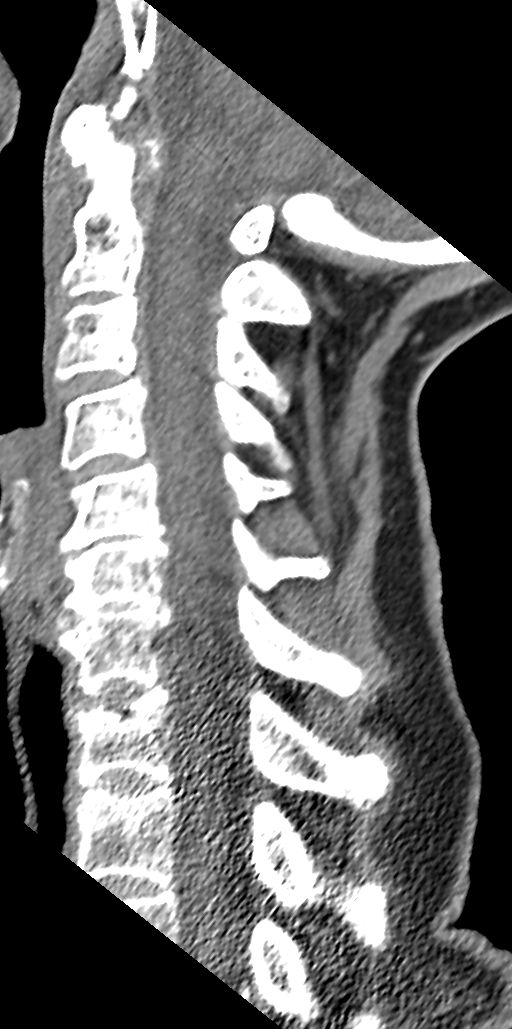
[im 24/48  bone]
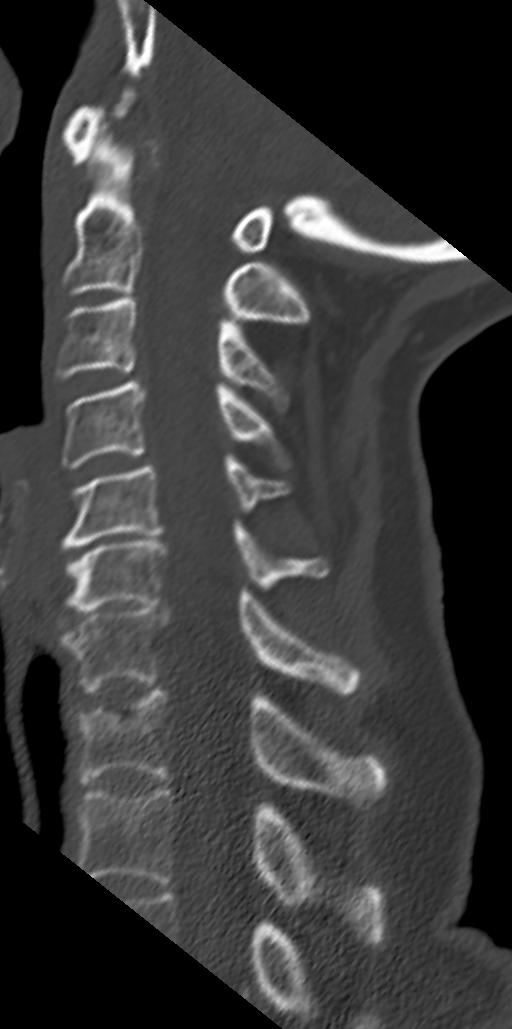
[im 28/48  bone]
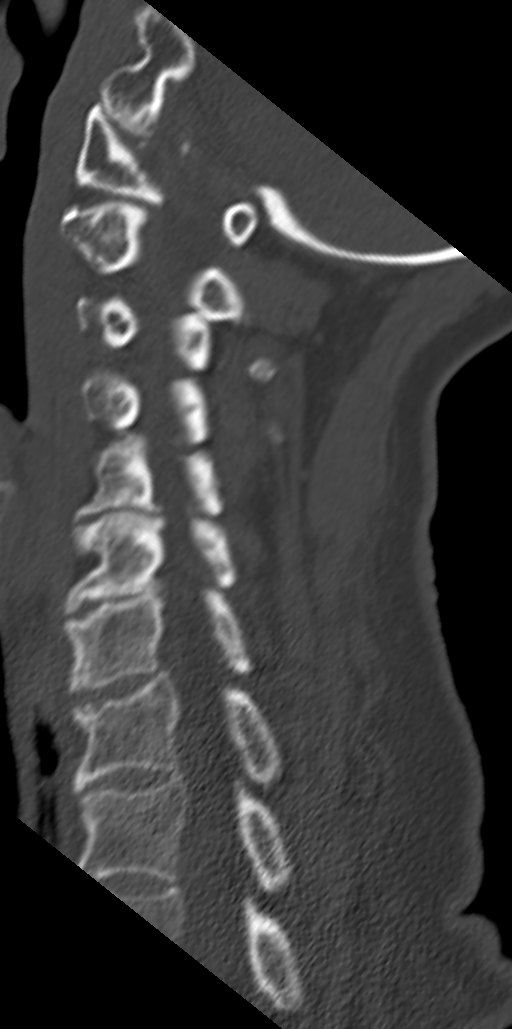
[im 32/48  bone]
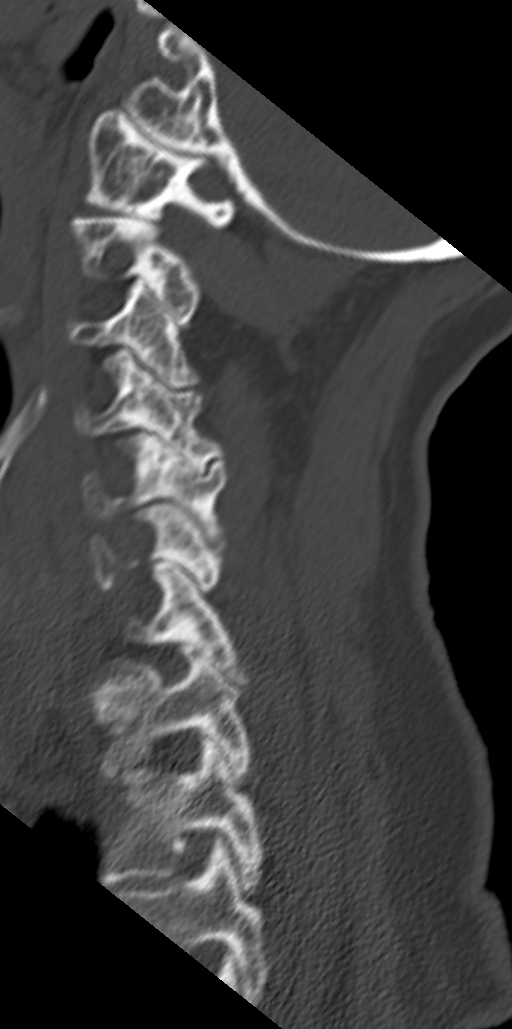

[10 of 27 positions shown; findings below may reference images not displayed]

FINDINGS: CT HEAD FINDINGS

Moderate diffuse atrophy is stable. There is no intracranial mass,
hemorrhage, extra-axial fluid collection, or midline shift. There is
patchy small vessel disease throughout the centra semiovale
bilaterally. Small vessel disease is noted in each external and
internal capsule. A small prior lacunar infarct is noted in the
midportion of the left external capsule. There is no new gray-white
compartment lesion. No acute infarct is apparent. There is a small
calcification in the right parietal lobe which is stable and may
represent a small granuloma. The bony calvarium appears intact. The
mastoid air cells are clear.

CT CERVICAL SPINE FINDINGS

There is no acute fracture or spondylolisthesis. Slight anterior
wedging at T1 is chronic and stable. Prevertebral soft tissues and
predental space regions are normal. There is moderately severe disc
space narrowing at C5-6 and C6-7. There is multilevel facet
hypertrophy bilaterally involving most levels, stable. There is no
frank disc extrusion or stenosis. There are foci of carotid artery
calcification bilaterally.
IMPRESSION: CT head: Atrophy with small vessel disease, stable. No acute
appearing infarct. No hemorrhage or mass effect. No calvarium
fracture seen.

CT cervical spine: Multilevel arthropathy, stable. Slight anterior
wedging of the T1 vertebral body, stable. No acute fracture or
spondylolisthesis. Foci of carotid artery calcification bilaterally.
No appreciable change compared to recent prior study.

## 2016-04-28 DIAGNOSIS — R410 Disorientation, unspecified: Secondary | ICD-10-CM | POA: Diagnosis not present

## 2016-04-28 LAB — URINALYSIS COMPLETE WITH MICROSCOPIC (ARMC ONLY)
BILIRUBIN URINE: NEGATIVE
Bacteria, UA: NONE SEEN
GLUCOSE, UA: NEGATIVE mg/dL
HGB URINE DIPSTICK: NEGATIVE
KETONES UR: NEGATIVE mg/dL
NITRITE: NEGATIVE
Protein, ur: NEGATIVE mg/dL
Renal Epithelial: 4
SPECIFIC GRAVITY, URINE: 1.023 (ref 1.005–1.030)
pH: 5 (ref 5.0–8.0)

## 2016-04-28 LAB — CBC WITH DIFFERENTIAL/PLATELET
BASOS PCT: 1 %
Basophils Absolute: 0 10*3/uL (ref 0–0.1)
EOS ABS: 0.2 10*3/uL (ref 0–0.7)
Eosinophils Relative: 4 %
HEMATOCRIT: 35.5 % (ref 35.0–47.0)
HEMOGLOBIN: 11.9 g/dL — AB (ref 12.0–16.0)
LYMPHS ABS: 1.7 10*3/uL (ref 1.0–3.6)
Lymphocytes Relative: 30 %
MCH: 33.3 pg (ref 26.0–34.0)
MCHC: 33.4 g/dL (ref 32.0–36.0)
MCV: 99.6 fL (ref 80.0–100.0)
Monocytes Absolute: 0.5 10*3/uL (ref 0.2–0.9)
Monocytes Relative: 9 %
NEUTROS ABS: 3.2 10*3/uL (ref 1.4–6.5)
NEUTROS PCT: 56 %
Platelets: 163 10*3/uL (ref 150–440)
RBC: 3.56 MIL/uL — AB (ref 3.80–5.20)
RDW: 13.8 % (ref 11.5–14.5)
WBC: 5.7 10*3/uL (ref 3.6–11.0)

## 2016-04-28 LAB — COMPREHENSIVE METABOLIC PANEL
ALK PHOS: 57 U/L (ref 38–126)
ALT: 8 U/L — ABNORMAL LOW (ref 14–54)
ANION GAP: 9 (ref 5–15)
AST: 22 U/L (ref 15–41)
Albumin: 3.9 g/dL (ref 3.5–5.0)
BILIRUBIN TOTAL: 0.5 mg/dL (ref 0.3–1.2)
BUN: 33 mg/dL — ABNORMAL HIGH (ref 6–20)
CO2: 24 mmol/L (ref 22–32)
Calcium: 9.3 mg/dL (ref 8.9–10.3)
Chloride: 105 mmol/L (ref 101–111)
Creatinine, Ser: 0.79 mg/dL (ref 0.44–1.00)
Glucose, Bld: 85 mg/dL (ref 65–99)
Potassium: 4.4 mmol/L (ref 3.5–5.1)
Sodium: 138 mmol/L (ref 135–145)
Total Protein: 6.7 g/dL (ref 6.5–8.1)

## 2016-04-28 LAB — GLUCOSE, CAPILLARY
Glucose-Capillary: 102 mg/dL — ABNORMAL HIGH (ref 65–99)
Glucose-Capillary: 96 mg/dL (ref 65–99)

## 2016-04-29 LAB — URINE CULTURE

## 2016-04-29 LAB — GLUCOSE, CAPILLARY
GLUCOSE-CAPILLARY: 170 mg/dL — AB (ref 65–99)
Glucose-Capillary: 98 mg/dL (ref 65–99)
Glucose-Capillary: 126 mg/dL — ABNORMAL HIGH (ref 65–99)

## 2016-04-30 LAB — GLUCOSE, CAPILLARY
GLUCOSE-CAPILLARY: 92 mg/dL (ref 65–99)
Glucose-Capillary: 113 mg/dL — ABNORMAL HIGH (ref 65–99)
Glucose-Capillary: 117 mg/dL — ABNORMAL HIGH (ref 65–99)

## 2016-05-01 LAB — GLUCOSE, CAPILLARY
GLUCOSE-CAPILLARY: 108 mg/dL — AB (ref 65–99)
GLUCOSE-CAPILLARY: 133 mg/dL — AB (ref 65–99)

## 2016-05-02 LAB — GLUCOSE, CAPILLARY
GLUCOSE-CAPILLARY: 82 mg/dL (ref 65–99)
GLUCOSE-CAPILLARY: 77 mg/dL (ref 65–99)
Glucose-Capillary: 115 mg/dL — ABNORMAL HIGH (ref 65–99)
Glucose-Capillary: 174 mg/dL — ABNORMAL HIGH (ref 65–99)

## 2016-05-03 DIAGNOSIS — R410 Disorientation, unspecified: Secondary | ICD-10-CM | POA: Diagnosis not present

## 2016-05-03 LAB — URINALYSIS COMPLETE WITH MICROSCOPIC (ARMC ONLY)
BILIRUBIN URINE: NEGATIVE
Bacteria, UA: NONE SEEN
Glucose, UA: NEGATIVE mg/dL
Hgb urine dipstick: NEGATIVE
KETONES UR: NEGATIVE mg/dL
Leukocytes, UA: NEGATIVE
Nitrite: NEGATIVE
PROTEIN: NEGATIVE mg/dL
RBC / HPF: NONE SEEN RBC/hpf (ref 0–5)
Specific Gravity, Urine: 1.021 (ref 1.005–1.030)
pH: 6 (ref 5.0–8.0)

## 2016-05-03 LAB — GLUCOSE, CAPILLARY
GLUCOSE-CAPILLARY: 106 mg/dL — AB (ref 65–99)
Glucose-Capillary: 169 mg/dL — ABNORMAL HIGH (ref 65–99)

## 2016-05-04 LAB — GLUCOSE, CAPILLARY
Glucose-Capillary: 97 mg/dL (ref 65–99)
Glucose-Capillary: 164 mg/dL — ABNORMAL HIGH (ref 65–99)

## 2016-05-05 LAB — URINE CULTURE

## 2016-05-05 LAB — GLUCOSE, CAPILLARY
GLUCOSE-CAPILLARY: 96 mg/dL (ref 65–99)
GLUCOSE-CAPILLARY: 150 mg/dL — AB (ref 65–99)

## 2016-05-06 LAB — GLUCOSE, CAPILLARY
GLUCOSE-CAPILLARY: 101 mg/dL — AB (ref 65–99)
Glucose-Capillary: 103 mg/dL — ABNORMAL HIGH (ref 65–99)

## 2016-05-07 LAB — GLUCOSE, CAPILLARY: GLUCOSE-CAPILLARY: 131 mg/dL — AB (ref 65–99)

## 2016-05-08 LAB — GLUCOSE, CAPILLARY
GLUCOSE-CAPILLARY: 104 mg/dL — AB (ref 65–99)
Glucose-Capillary: 115 mg/dL — ABNORMAL HIGH (ref 65–99)
Glucose-Capillary: 94 mg/dL (ref 65–99)
Glucose-Capillary: 199 mg/dL — ABNORMAL HIGH (ref 65–99)

## 2016-05-09 LAB — GLUCOSE, CAPILLARY
Glucose-Capillary: 96 mg/dL (ref 65–99)
Glucose-Capillary: 85 mg/dL (ref 65–99)

## 2016-05-10 LAB — GLUCOSE, CAPILLARY
GLUCOSE-CAPILLARY: 100 mg/dL — AB (ref 65–99)
GLUCOSE-CAPILLARY: 129 mg/dL — AB (ref 65–99)
Glucose-Capillary: 174 mg/dL — ABNORMAL HIGH (ref 65–99)

## 2016-05-10 IMAGING — CT CT HEAD WITHOUT CONTRAST
1 series · 16 of 30 positions shown, 20 images · non-contrast
Comparison: 10/29/2014

CLINICAL DATA: Found down by the bed.  Swelling on back of head.

EXAM:
CT HEAD WITHOUT CONTRAST
TECHNIQUE: Contiguous axial images were obtained from the base of the skull
through the vertex without intravenous contrast.

[Series 2: head wo · axial · 0.42mm/px · z∈[-174,-48]mm · 16 of 32 slices shown, 20 images]
[im 2/32  brain]
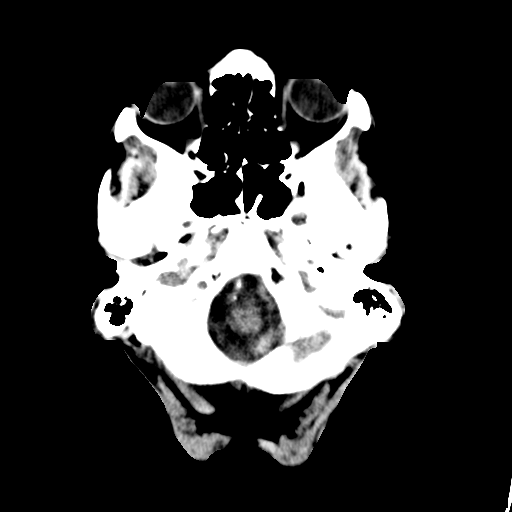
[im 2/32  bone]
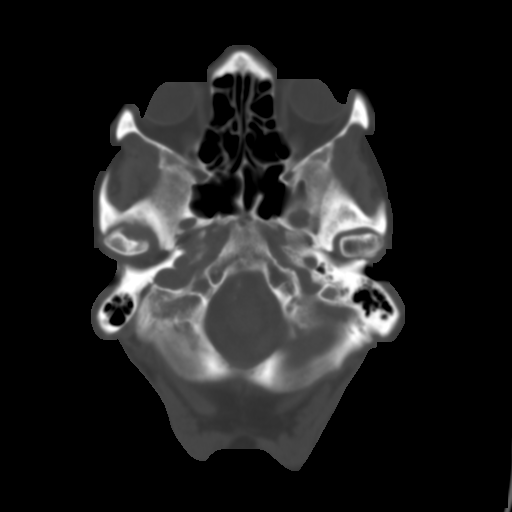
[im 4/32  brain]
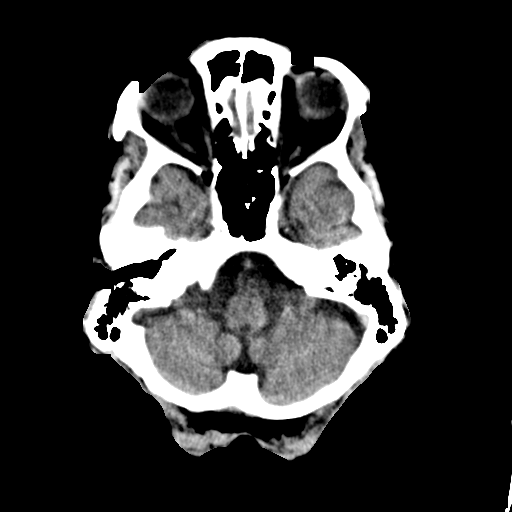
[im 6/32  brain]
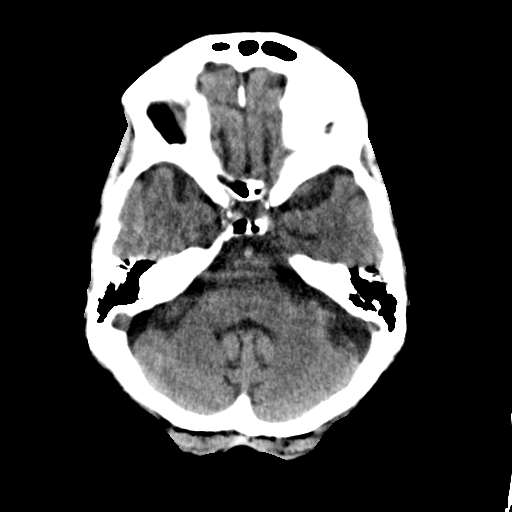
[im 8/32  brain]
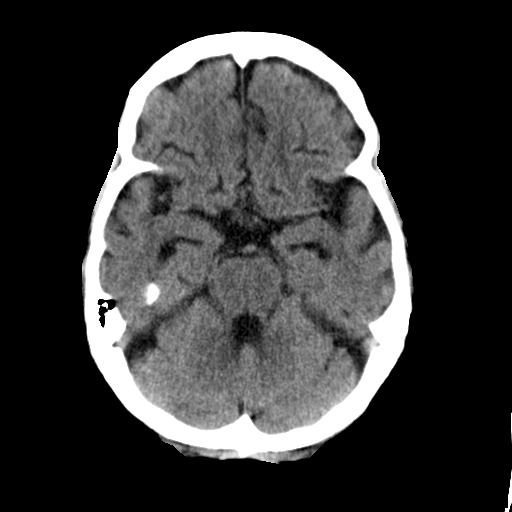
[im 9/32  brain]
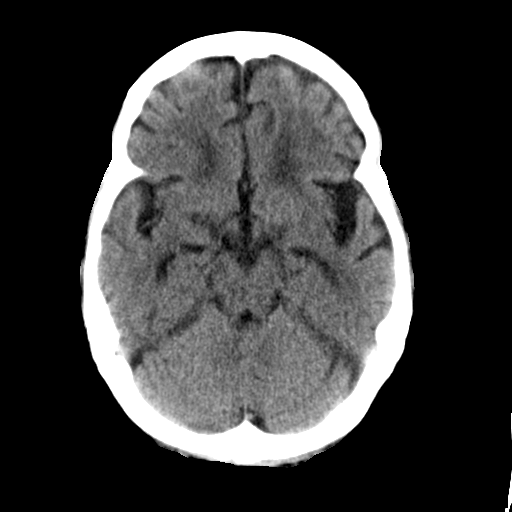
[im 9/32  bone]
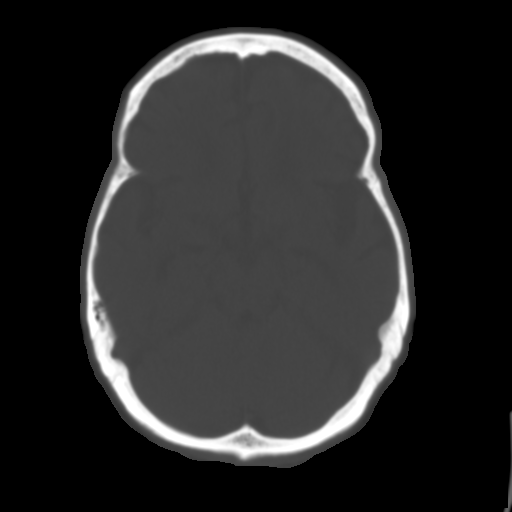
[im 11/32  brain]
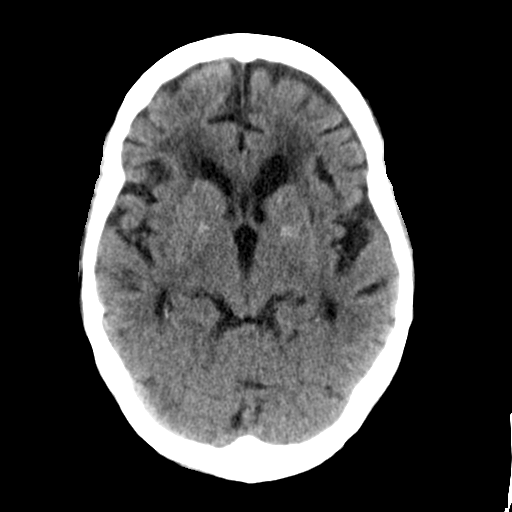
[im 13/32  brain]
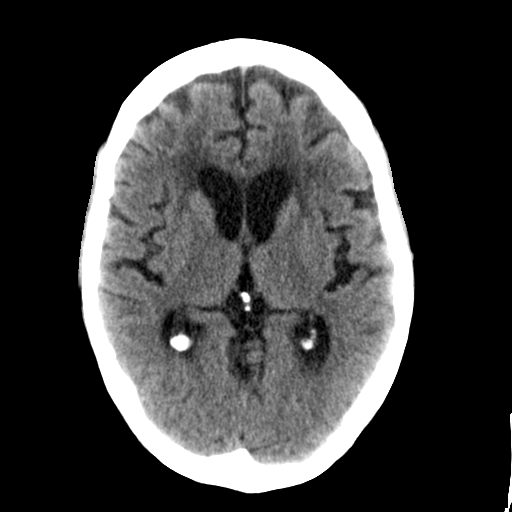
[im 15/32  brain]
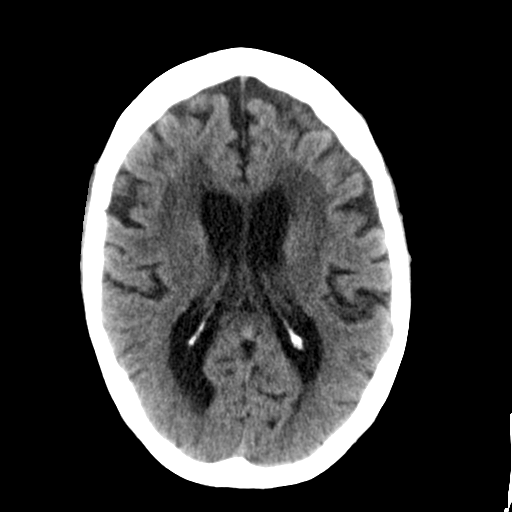
[im 17/32  brain]
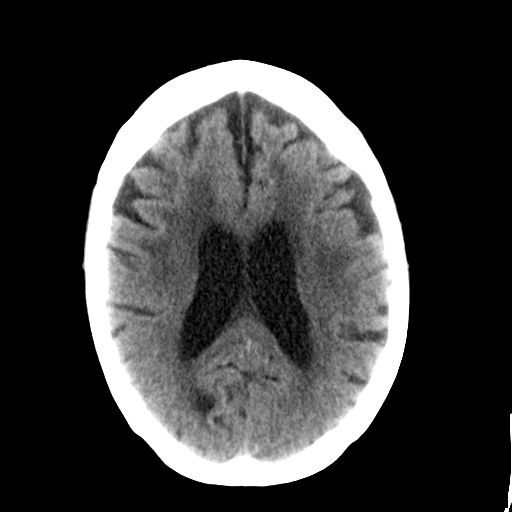
[im 17/32  bone]
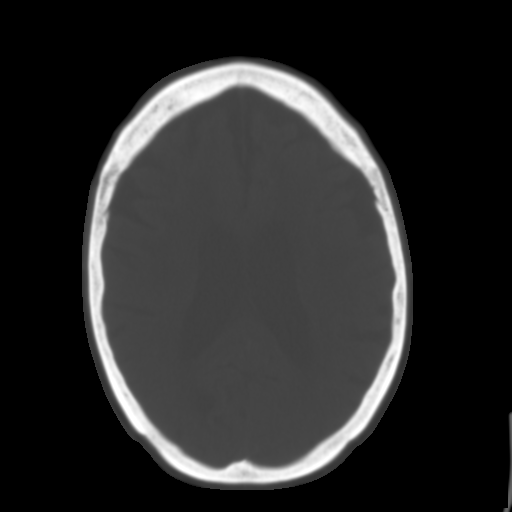
[im 19/32  brain]
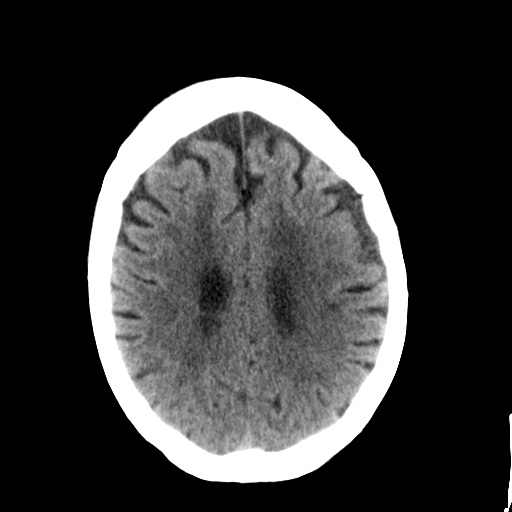
[im 21/32  brain]
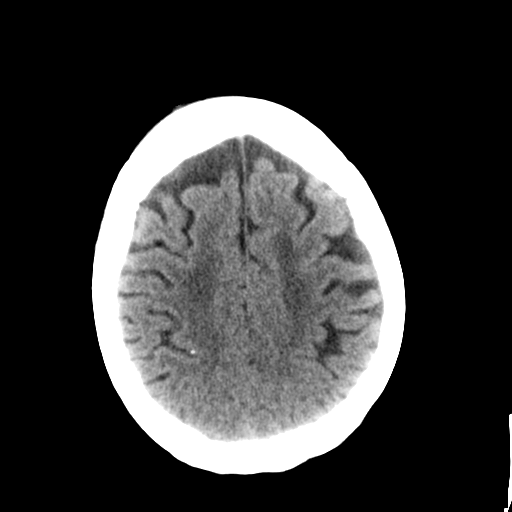
[im 23/32  brain]
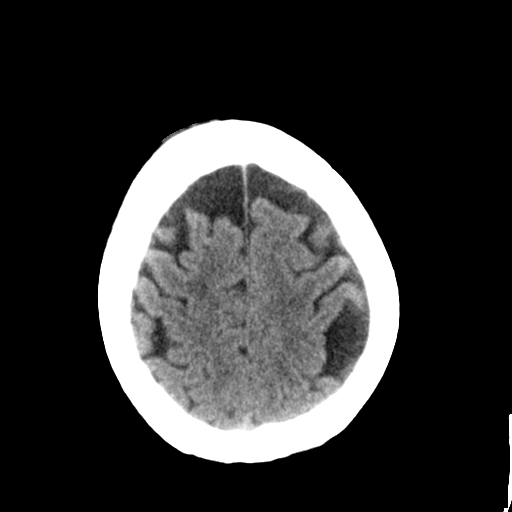
[im 24/32  brain]
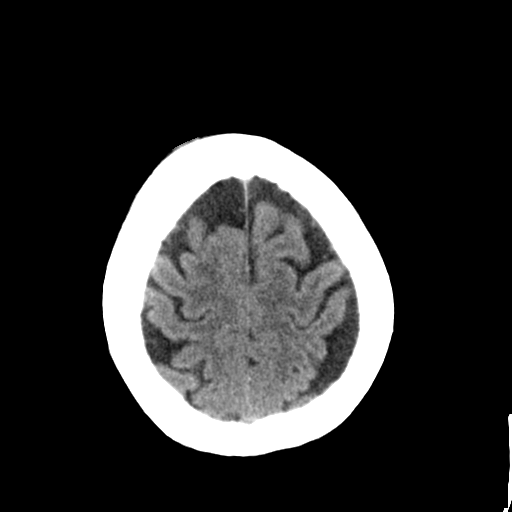
[im 24/32  bone]
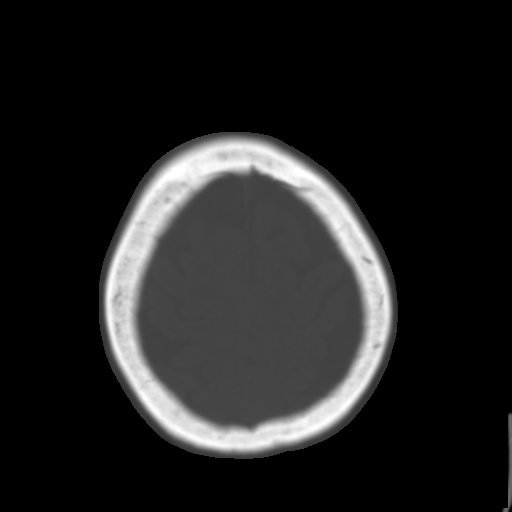
[im 26/32  brain]
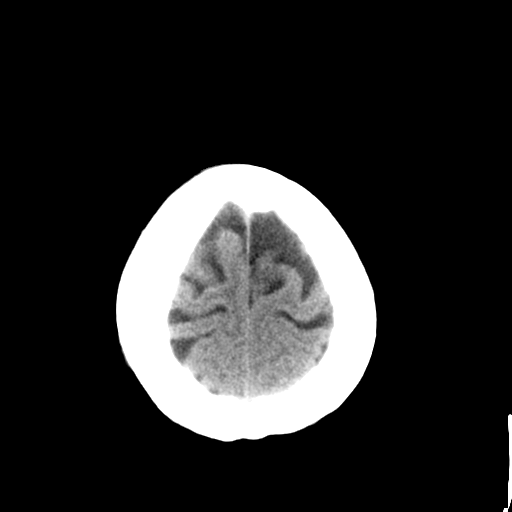
[im 28/32  brain]
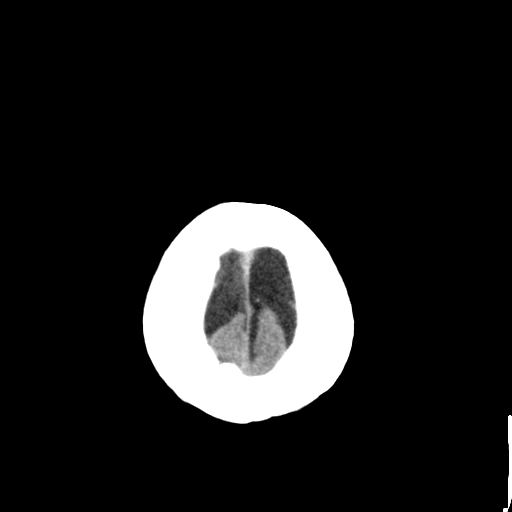
[im 30/32  brain]
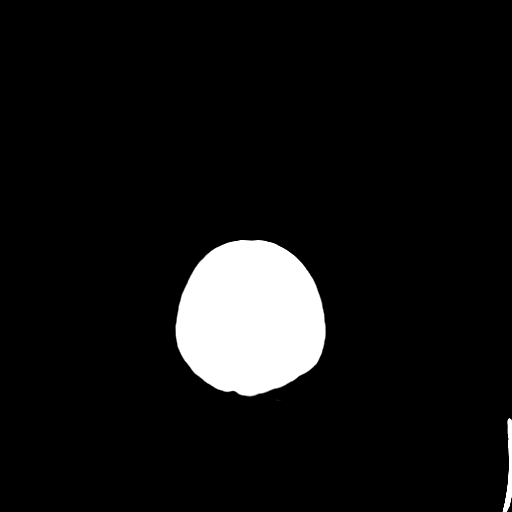

[16 of 30 positions shown; findings below may reference images not displayed]

FINDINGS: There is atrophy and chronic small vessel disease changes. No acute
intracranial abnormality. Specifically, no hemorrhage,
hydrocephalus, mass lesion, acute infarction, or significant
intracranial injury. No acute calvarial abnormality. Visualized
paranasal sinuses and mastoids clear. Orbital soft tissues
unremarkable.
IMPRESSION: No acute intracranial abnormality.

Atrophy, chronic microvascular disease.

## 2016-05-11 ENCOUNTER — Encounter
Admission: RE | Admit: 2016-05-11 | Discharge: 2016-05-11 | Disposition: A | Discharge status: Home / Self Care | Source: Ambulatory Visit | Attending: Internal Medicine | Admitting: Internal Medicine

## 2016-05-11 DIAGNOSIS — E119 Type 2 diabetes mellitus without complications: Secondary | ICD-10-CM | POA: Insufficient documentation

## 2016-05-11 DIAGNOSIS — R451 Restlessness and agitation: Secondary | ICD-10-CM | POA: Insufficient documentation

## 2016-05-11 DIAGNOSIS — R300 Dysuria: Secondary | ICD-10-CM | POA: Diagnosis not present

## 2016-05-11 LAB — GLUCOSE, CAPILLARY
GLUCOSE-CAPILLARY: 129 mg/dL — AB (ref 65–99)
GLUCOSE-CAPILLARY: 114 mg/dL — AB (ref 65–99)
Glucose-Capillary: 202 mg/dL — ABNORMAL HIGH (ref 65–99)

## 2016-05-15 DIAGNOSIS — R300 Dysuria: Secondary | ICD-10-CM | POA: Diagnosis not present

## 2016-05-15 DIAGNOSIS — R451 Restlessness and agitation: Secondary | ICD-10-CM | POA: Diagnosis not present

## 2016-05-15 DIAGNOSIS — E119 Type 2 diabetes mellitus without complications: Secondary | ICD-10-CM | POA: Diagnosis not present

## 2016-05-16 ENCOUNTER — Emergency Department

## 2016-05-16 ENCOUNTER — Emergency Department
Admission: EM | Admit: 2016-05-16 | Discharge: 2016-05-16 | Disposition: A | Discharge status: Home / Self Care | Attending: Emergency Medicine | Admitting: Emergency Medicine

## 2016-05-16 ENCOUNTER — Encounter: Payer: Self-pay | Admitting: Emergency Medicine

## 2016-05-16 DIAGNOSIS — Z8673 Personal history of transient ischemic attack (TIA), and cerebral infarction without residual deficits: Secondary | ICD-10-CM | POA: Insufficient documentation

## 2016-05-16 DIAGNOSIS — F329 Major depressive disorder, single episode, unspecified: Secondary | ICD-10-CM | POA: Insufficient documentation

## 2016-05-16 DIAGNOSIS — I482 Chronic atrial fibrillation: Secondary | ICD-10-CM | POA: Diagnosis not present

## 2016-05-16 DIAGNOSIS — Z7984 Long term (current) use of oral hypoglycemic drugs: Secondary | ICD-10-CM | POA: Insufficient documentation

## 2016-05-16 DIAGNOSIS — I422 Other hypertrophic cardiomyopathy: Secondary | ICD-10-CM | POA: Diagnosis not present

## 2016-05-16 DIAGNOSIS — W19XXXA Unspecified fall, initial encounter: Secondary | ICD-10-CM

## 2016-05-16 DIAGNOSIS — Z87891 Personal history of nicotine dependence: Secondary | ICD-10-CM | POA: Diagnosis not present

## 2016-05-16 DIAGNOSIS — I480 Paroxysmal atrial fibrillation: Secondary | ICD-10-CM | POA: Insufficient documentation

## 2016-05-16 DIAGNOSIS — E785 Hyperlipidemia, unspecified: Secondary | ICD-10-CM | POA: Diagnosis not present

## 2016-05-16 DIAGNOSIS — J449 Chronic obstructive pulmonary disease, unspecified: Secondary | ICD-10-CM | POA: Insufficient documentation

## 2016-05-16 DIAGNOSIS — I509 Heart failure, unspecified: Secondary | ICD-10-CM | POA: Diagnosis not present

## 2016-05-16 DIAGNOSIS — Y999 Unspecified external cause status: Secondary | ICD-10-CM | POA: Insufficient documentation

## 2016-05-16 DIAGNOSIS — S0093XA Contusion of unspecified part of head, initial encounter: Secondary | ICD-10-CM | POA: Diagnosis not present

## 2016-05-16 DIAGNOSIS — N390 Urinary tract infection, site not specified: Secondary | ICD-10-CM

## 2016-05-16 DIAGNOSIS — I110 Hypertensive heart disease with heart failure: Secondary | ICD-10-CM | POA: Diagnosis not present

## 2016-05-16 DIAGNOSIS — E119 Type 2 diabetes mellitus without complications: Secondary | ICD-10-CM | POA: Insufficient documentation

## 2016-05-16 DIAGNOSIS — Z96659 Presence of unspecified artificial knee joint: Secondary | ICD-10-CM | POA: Diagnosis not present

## 2016-05-16 DIAGNOSIS — Y939 Activity, unspecified: Secondary | ICD-10-CM | POA: Insufficient documentation

## 2016-05-16 DIAGNOSIS — W1800XA Striking against unspecified object with subsequent fall, initial encounter: Secondary | ICD-10-CM | POA: Diagnosis not present

## 2016-05-16 DIAGNOSIS — Y929 Unspecified place or not applicable: Secondary | ICD-10-CM | POA: Insufficient documentation

## 2016-05-16 DIAGNOSIS — S0990XA Unspecified injury of head, initial encounter: Secondary | ICD-10-CM | POA: Diagnosis present

## 2016-05-16 LAB — URINALYSIS COMPLETE WITH MICROSCOPIC (ARMC ONLY)
Bilirubin Urine: NEGATIVE
GLUCOSE, UA: NEGATIVE mg/dL
Hgb urine dipstick: NEGATIVE
KETONES UR: NEGATIVE mg/dL
NITRITE: POSITIVE — AB
PROTEIN: NEGATIVE mg/dL
Specific Gravity, Urine: 1.016 (ref 1.005–1.030)
Squamous Epithelial / LPF: NONE SEEN
pH: 5 (ref 5.0–8.0)

## 2016-05-16 MED ORDER — CEPHALEXIN 500 MG PO CAPS: 500.0000 mg | ORAL_CAPSULE | Freq: Two times a day (BID) | ORAL | Status: DC

## 2016-05-16 MED ORDER — CEPHALEXIN 500 MG PO CAPS
500.0000 mg | ORAL_CAPSULE | Freq: Once | ORAL | Status: AC
  Administered 2016-05-16: 500 mg via ORAL
  Filled 2016-05-16: qty 1

## 2016-05-16 NOTE — ED Provider Notes (Signed)
[facility] Emergency Department Provider Note  ____________________________________________  Time seen: Approximately 3:45 AM  I have reviewed the triage vital signs and the nursing notes.   HISTORY  Chief Complaint Fall  The patient has chronic dementia and chronic expressive aphasia which limits the history.  HPI [name] [name] is a 80 y.o. female with an extensive past medical history that includes chronic dementia presents by EMS from [facility] for evaluation after a fall.  Reportedly she had an unwitnessed fall likely after getting up to go to the bathroom.  She has a hematoma on her right forehead.  She has some expressive aphasia at baseline from a prior stroke and does have dementia.  She is currently taking Plavix.  She is able to communicate in spite of her aphasia and denies being in any pain.  She also denies chest pain, shortness of breath, and abdominal pain. I ask her if she fell because she got up to go to the bathroom and she nodded but I cannot be absolutely certain that she understood my question.  She is in no acute distress at this time.   Past Medical History  Diagnosis Date  . Hypertrophic cardiomyopathy (HCC)     a. 11/2013 Echo: EF 55-60%, basal inf HK, sev dil LA, no evidence of HCM.  PASP .  [name] Hypertension   . Situational mixed anxiety and depressive disorder   . Hypercholesterolemia   . Falls frequently     coumadin stopped  . Pulmonary HTN (HCC)     a. has had prior right heart cath in 2010; was felt that most likely due to elevated left sided pressures and would not benefit from vasodilator therapy;  b. 11/2013 Echo: PASP .  [name] Oxygen dependent     "3L 24/7" (08/05/2013)  . COPD (chronic obstructive pulmonary disease) (HCC)   . TIA (transient ischemic attack)   . Atrial fibrillation (HCC)     a. Chronic; No longer on coumadin 2/2 frequent falls.  . CHF (congestive heart failure) (HCC)   . Pacemaker     a. 04/2012 MDT  [id number] [employer] PPM, ser #: [id number] H.  . Varicose veins   . History of pneumonia     "couple times; long time ago" (08/05/2013)  . Type II diabetes mellitus (HCC)   . Anemia     "as a child" (08/05/2013)  . GERD (gastroesophageal reflux disease)   . H/O hiatal hernia   . Osteoarthritis   . Arthritis     "all over me; in all my joints" (08/05/2013)  . Coccyx pain   . Gout     "right foot" (08/05/2013)  . Anxiety   . Depression   . Chest pain     a. 08/2011 Myoview: sm, fixed anteroseptal defect (attenuation), no ischemia, EF 62%.  . Dementia   . Hyposmolality   . Hyponatremia     Patient Active Problem List   Diagnosis Date Noted  . Paroxysmal atrial fibrillation (HCC)   . Encounter for palliative care   . Goals of care, counseling/discussion   . Altered mental state   . Cerebrovascular accident (CVA) due to embolism of left middle cerebral artery (HCC)   . Dementia   . Facial asymmetry 02/01/2016  . Altered mental status 02/01/2016  . Closed head injury 05/30/2014  . Pulmonary hypertension, moderate to severe (HCC) 05/30/2014  . DM (diabetes mellitus), type 2 with neurological complications (HCC) 05/30/2014  . Hypoglycemia 05/29/2014  . Hypokalemia 05/29/2014  .  Subdural hematoma caused by concussion (HCC) 05/28/2014  . hemorrhagic cerebral contusion 05/28/2014  . Dyslipidemia 03/07/2014  . Chronic respiratory failure (HCC) 01/01/2014  . Chest pain   . UTI (urinary tract infection) 11/19/2013  . Compression fracture of L4 lumbar vertebra (HCC) 11/18/2013  . Syncope 11/18/2013  . Protein-calorie malnutrition, severe (HCC) 11/18/2013  . Syncope and collapse 11/17/2013  . Closed compression fracture of L4 lumbar vertebra (HCC) 11/17/2013  . Gait difficulty 10/05/2013  . Fall (on)(from) incline, initial encounter 10/03/2013  . Essential hypertension, benign 10/03/2013  . Unsteady gait 10/03/2013  . Peripheral neuropathy (HCC) 10/03/2013  . Back pain 08/06/2013  .  Near syncope 08/06/2013  . Pulmonary hypertension (HCC) 08/05/2013  . Abnormality of gait 05/24/2013  . Acute respiratory distress (HCC) 05/08/2013  . Pulmonary edema, acute (HCC) 05/08/2013  . Benign hypertensive heart disease without heart failure   . Pulmonary HTN (HCC)   . Oxygen dependent   . COPD (chronic obstructive pulmonary disease) (HCC)   . Fatigue due to cardiopulmonary and deconditioning 04/23/2013  . Memory deficit 02/07/2013  . Memory change 12/11/2012  . TIA (transient ischemic attack) 02/19/2012  . Chest pain at rest 08/05/2011  . Type II or unspecified type diabetes mellitus without mention of complication, uncontrolled 06/12/2011  . Fall 03/22/2011  . Mitral regurgitation 03/13/2011  . Hypertrophic cardiomyopathy (HCC)   . Osteoarthritis   . Situational mixed anxiety and depressive disorder   . Pacemaker 02/26/2011  . Atrial fibrillation (HCC) 01/28/2011  . CHRONIC OBSTRUCTIVE PULMONARY DISEASE, MODERATE 05/23/2010  . DEPRESSIVE DISORDER NOT ELSEWHERE CLASSIFIED 04/11/2010  . HYPERLIPIDEMIA 05/31/2009  . Essential hypertension 05/31/2009  . ALLERGIC RHINITIS 05/31/2009  . SHORTNESS OF BREATH 05/31/2009  . Cough 05/31/2009    Past Surgical History  Procedure Laterality Date  . Total knee arthroplasty Right 2011  . Tonsillectomy    . Appendectomy    . Abdominal hysterectomy      "partial" (08/05/2013)  . Dilation and curettage of uterus      "several" (08/05/2013)  . Foot neuroma surgery Right   . Insert / replace / remove pacemaker  1998; 2000's; 2014  . Cardiac catheterization      "more than once" (08/05/2013)  . Back surgery      "bone spur removed; mid back" (08/05/2013)  . Pacemaker generator change N/A 04/24/2012    Procedure: PACEMAKER GENERATOR CHANGE;  Surgeon: Duke Salvia, MD;  Location: [facility] CATH LAB;  Service: Cardiovascular;  Laterality: N/A;    Current Outpatient Rx  Name  Route  Sig  Dispense  Refill  . acetaminophen (TYLENOL) 325 MG  tablet   Oral   Take 650 mg by mouth every 6 (six) hours as needed for mild pain or fever.          . benzocaine-menthol (CHLORASEPTIC) 6-10 MG lozenge   Oral   Take 1 lozenge by mouth every 2 (two) hours as needed for sore throat.          . calcium carbonate (TUMS EX) 750 MG chewable tablet   Oral   Chew 1 tablet by mouth every 6 (six) hours as needed for heartburn.         . cephALEXin (KEFLEX) 500 MG capsule   Oral   Take 1 capsule (500 mg total) by mouth 2 (two) times daily.   14 capsule   0   . clopidogrel (PLAVIX) 75 MG tablet   Oral   Take 1 tablet (75 mg total) by  mouth daily.   30 tablet   1   . Cranberry-Vitamin C-Probiotic (AZO CRANBERRY) 250-30 MG TABS   Oral   Take 1 tablet by mouth daily.         [name]. Dextromethorphan-Guaifenesin (DIABETIC TUSSIN MAX ST) 10-200 MG/5ML LIQD   Oral   Take 10 mLs by mouth every 6 (six) hours as needed (for cough).         [name]. diltiazem (CARDIZEM CD) 240 MG 24 hr capsule   Oral   Take 1 capsule (240 mg total) by mouth daily.   90 capsule   3   . divalproex (DEPAKOTE SPRINKLE) 125 MG capsule   Oral   Take 250 mg by mouth 2 (two) times daily.         [name]. docusate sodium (COLACE) 100 MG capsule   Oral   Take 100 mg by mouth 2 (two) times daily.         [name]. losartan (COZAAR) 50 MG tablet      TAKE 1 TABLET BY MOUTH EVERY DAY   30 tablet   1   . metFORMIN (GLUCOPHAGE) 500 MG tablet   Oral   Take 500 mg by mouth daily with breakfast.          . mirtazapine (REMERON SOL-TAB) 15 MG disintegrating tablet   Oral   Take 15 mg by mouth at bedtime.         . mometasone-formoterol (DULERA) 200-5 MCG/ACT AERO   Inhalation   Inhale 2 puffs into the lungs 2 (two) times daily.   1 Inhaler   0   . morphine (ROXANOL) 20 MG/ML concentrated solution   Oral   Take 0.5 mLs (10 mg total) by mouth every 4 (four) hours as needed for severe pain.   30 mL   0   . Multiple Vitamins-Iron (MULTIVITAMIN/IRON PO)   Oral    Take 1 tablet by mouth daily.         [name]. oxybutynin (DITROPAN XL) 15 MG 24 hr tablet   Oral   Take 15 mg by mouth every morning.          . potassium chloride SA (K-DUR,KLOR-CON) 20 MEQ tablet   Oral   Take 20 mEq by mouth daily. In the morning         . pravastatin (PRAVACHOL) 40 MG tablet   Oral   Take 1 tablet (40 mg total) by mouth daily at 6 PM.   30 tablet   1   . prochlorperazine (COMPAZINE) 10 MG tablet   Oral   Take 10 mg by mouth every 6 (six) hours as needed for nausea or vomiting.         . senna (SENOKOT) 8.6 MG TABS tablet   Oral   Take 1 tablet by mouth 2 (two) times daily.         . traZODone (DESYREL) 50 MG tablet   Oral   Take 25 mg by mouth 3 (three) times daily.            Allergies Aspirin; Calan; Crestor; Escitalopram oxalate; Lipitor; Lopressor; Nitroglycerin; Norvasc; Nsaids; Oxycodone; Prednisone; Sulfa antibiotics; Vimovo; and Penicillins  Family History  Problem Relation Age of Onset  . Other Father     died in plane crash  . Cancer Mother     lymphoma-NHL  . Coronary artery disease Grandchild   . Heart disease Maternal Grandmother     Social History Social History  Substance Use Topics  .  Smoking status: Former Smoker -- 1.00 packs/day for 40 years    Types: Cigarettes    Quit date: 11/15/2005  . Smokeless tobacco: Never Used  . Alcohol Use: No    Review of Systems  All review of systems is not practical given the patient's dementia and expressive aphasia.  However she does deny being in any acute pain and denies difficulty breathing.  ____________________________________________   PHYSICAL EXAM:  VITAL SIGNS: ED Triage Vitals  Enc Vitals Group     BP 05/16/16 0247 188/73 mmHg     Pulse Rate 05/16/16 0247 61     Resp 05/16/16 0247 14     Temp 05/16/16 0247 98 F (36.7 C)     Temp Source 05/16/16 0247 Oral     SpO2 05/16/16 0247 98 %     Weight 05/16/16 0247 143 lb (64.864 kg)     Height 05/16/16 0247 5\' 5"   (1.651 m)     Head Cir --      Peak Flow --      Pain Score --      Pain Loc --      Pain Edu? --      Excl. in GC? --     Constitutional: Sleeping but awakens to loud voice.  Chronically ill and elderly appearance but nontoxic currently. Eyes: Conjunctivae are normal. PERRL. EOMI. Head: Large contusion and superficial abrasion on the right forehead Nose: No congestion/rhinnorhea. Mouth/Throat: Mucous membranes are moist.  Oropharynx non-erythematous. Neck: No stridor.  No meningeal signs.  No cervical spine tenderness to palpation. Cardiovascular: Normal rate, regular rhythm. Good peripheral circulation. Grossly normal heart sounds.   Respiratory: Normal respiratory effort.  No retractions. Lungs CTAB. Gastrointestinal: Soft and nontender. No distention.  Musculoskeletal: No lower extremity tenderness nor edema. No gross deformities of extremities. Neurologic:  Normal speech and language. No gross focal neurologic deficits are appreciated.  Skin:  Skin is warm, dry and intact. No rash noted.   ____________________________________________   LABS (all labs ordered are listed, but only abnormal results are displayed)  Labs Reviewed - No data to display ____________________________________________  EKG  ED ECG REPORT I, Drystan Reader, the attending physician, personally viewed and interpreted this ECG.  Date: 05/16/2016 EKG Time: 02:45 Rate: 60 Rhythm: ventricular paced rhythm QRS Axis: normal Intervals: paced rhythm ST/T Wave abnormalities: Non-specific ST segment / T-wave changes, but no evidence of acute ischemia. Conduction Disturbances: none Narrative Interpretation: unremarkable  ____________________________________________  RADIOLOGY   Ct Head Wo Contrast  05/16/2016  CLINICAL DATA:  Initial evaluation for acute trauma, fall. EXAM: CT HEAD WITHOUT CONTRAST TECHNIQUE: Contiguous axial images were obtained from the base of the skull through the vertex without  intravenous contrast. COMPARISON:  Prior CT from 02/03/2016. FINDINGS: Small scalp contusion at the right forehead. Scalp soft tissues otherwise unremarkable. No acute abnormality about the globes and orbits. Mild opacity within the right frontoethmoidal recess. Paranasal sinuses are otherwise clear. Trace opacity left mastoid air cells. Right mastoid air cells clear. Calvarium intact. Generalized cerebral atrophy with chronic small vessel ischemic disease. Previously identified subacute posterior left MCA infarct has evolved, now appears chronic in nature with associated encephalomalacia. Small remote right cerebellar infarct also noted. Intracranial atherosclerosis noted. No acute intracranial hemorrhage. No acute large vessel territory infarct. No mass lesion, midline shift, or mass effect. Ventricular prominence related to global parenchymal volume loss without hydrocephalus. No extra-axial fluid collection. IMPRESSION: 1. No acute intracranial process. 2. Small right forehead scalp contusion. 3. Remote  left MCA and right cerebellar infarcts. 4. Atrophy with chronic small vessel ischemic disease. Electronically Signed   By: Rise Mu M.D.   On: 05/16/2016 03:28    ____________________________________________   PROCEDURES  Procedure(s) performed:   Procedures   ____________________________________________   INITIAL IMPRESSION / ASSESSMENT AND PLAN / ED COURSE  Pertinent labs & imaging results that were available during my care of the patient were reviewed by me and considered in my medical decision making (see chart for details).  The patient is in no acute distress and denies being in any pain.  A head CT was obtained given her obvious forehead contusion and being on Plavix, but fortunately the results were nonacute.  A urinalysis was in the system from less than 12 hours ago and indicates a significant urinary tract infection.  I see no antibiotics and her medication list, so  presumably this was a urine that was sent from [facility].  Because I know that she has a UTI, I gave her the first dose of Keflex and a prescription for treatment as an outpatient.  I also documented this in her discharge instructions for the facility.  A urine culture is also pending from earlier tonight.   ____________________________________________  FINAL CLINICAL IMPRESSION(S) / ED DIAGNOSES  Final diagnoses:  Fall, initial encounter  Head contusion, initial encounter  UTI (lower urinary tract infection)     MEDICATIONS GIVEN DURING THIS VISIT:  Medications  cephALEXin (KEFLEX) capsule 500 mg (500 mg Oral Given 05/16/16 0401)     NEW OUTPATIENT MEDICATIONS STARTED DURING THIS VISIT:  New Prescriptions   CEPHALEXIN (KEFLEX) 500 MG CAPSULE    Take 1 capsule (500 mg total) by mouth 2 (two) times daily.      Note:  This document was prepared using Dragon voice recognition software and may include unintentional dictation errors.   Loleta Rose, MD 05/16/16 [phone]

## 2016-05-16 NOTE — Discharge Instructions (Signed)
Fortunately other than the contusion on her forehead, Ms. [name] [name] does not appear to have suffered from any acute injuries from her fall.  We had the results from the urinalysis performed yesterday evening and she does appear to have a significant urinary tract infection.  We gave her a first dose of antibiotics and a prescription for a course of treatment.  We recommend that she follow up with her regular doctor at the next available opportunity.   Facial or Scalp Contusion A facial or scalp contusion is a deep bruise on the face or head. Injuries to the face and head generally cause a lot of swelling, especially around the eyes. Contusions are the result of an injury that caused bleeding under the skin. The contusion may turn blue, purple, or yellow. Minor injuries will give you a painless contusion, but more severe contusions may stay painful and swollen for a few weeks.  CAUSES  A facial or scalp contusion is caused by a blunt injury or trauma to the face or head area.  SIGNS AND SYMPTOMS   Swelling of the injured area.   Discoloration of the injured area.   Tenderness, soreness, or pain in the injured area.  DIAGNOSIS  The diagnosis can be made by taking a medical history and doing a physical exam. An X-ray exam, CT scan, or MRI may be needed to determine if there are any associated injuries, such as broken bones (fractures). TREATMENT  Often, the best treatment for a facial or scalp contusion is applying cold compresses to the injured area. Over-the-counter medicines may also be recommended for pain control.  HOME CARE INSTRUCTIONS   Only take over-the-counter or prescription medicines as directed by your health care provider.   Apply ice to the injured area.   Put ice in a plastic bag.   Place a towel between your skin and the bag.   Leave the ice on for 20 minutes, 2-3 times a day.  SEEK MEDICAL CARE IF:  You have bite problems.   You have pain with chewing.   You  are concerned about facial defects. SEEK IMMEDIATE MEDICAL CARE IF:  You have severe pain or a headache that is not relieved by medicine.   You have unusual sleepiness, confusion, or personality changes.   You throw up (vomit).   You have a persistent nosebleed.   You have double vision or blurred vision.   You have fluid drainage from your nose or ear.   You have difficulty walking or using your arms or legs.  MAKE SURE YOU:   Understand these instructions.  Will watch your condition.  Will get help right away if you are not doing well or get worse.   This information is not intended to replace advice given to you by your health care provider. Make sure you discuss any questions you have with your health care provider.   Document Released: 12/05/2004 Document Revised: 11/18/2014 Document Reviewed: 06/10/2013 Elsevier Interactive Patient Education 2016 Elsevier Inc.  Urinary Tract Infection Urinary tract infections (UTIs) can develop anywhere along your urinary tract. Your urinary tract is your body's drainage system for removing wastes and extra water. Your urinary tract includes two kidneys, two ureters, a bladder, and a urethra. Your kidneys are a pair of bean-shaped organs. Each kidney is about the size of your fist. They are located below your ribs, one on each side of your spine. CAUSES Infections are caused by microbes, which are microscopic organisms, including fungi, viruses, and bacteria.  These organisms are so small that they can only be seen through a microscope. Bacteria are the microbes that most commonly cause UTIs. SYMPTOMS  Symptoms of UTIs may vary by age and gender of the patient and by the location of the infection. Symptoms in young women typically include a frequent and intense urge to urinate and a painful, burning feeling in the bladder or urethra during urination. Older women and men are more likely to be tired, shaky, and weak and have muscle aches  and abdominal pain. A fever may mean the infection is in your kidneys. Other symptoms of a kidney infection include pain in your back or sides below the ribs, nausea, and vomiting. DIAGNOSIS To diagnose a UTI, your caregiver will ask you about your symptoms. Your caregiver will also ask you to provide a urine sample. The urine sample will be tested for bacteria and white blood cells. White blood cells are made by your body to help fight infection. TREATMENT  Typically, UTIs can be treated with medication. Because most UTIs are caused by a bacterial infection, they usually can be treated with the use of antibiotics. The choice of antibiotic and length of treatment depend on your symptoms and the type of bacteria causing your infection. HOME CARE INSTRUCTIONS  If you were prescribed antibiotics, take them exactly as your caregiver instructs you. Finish the medication even if you feel better after you have only taken some of the medication.  Drink enough water and fluids to keep your urine clear or pale yellow.  Avoid caffeine, tea, and carbonated beverages. They tend to irritate your bladder.  Empty your bladder often. Avoid holding urine for long periods of time.  Empty your bladder before and after sexual intercourse.  After a bowel movement, women should cleanse from front to back. Use each tissue only once. SEEK MEDICAL CARE IF:   You have back pain.  You develop a fever.  Your symptoms do not begin to resolve within 3 days. SEEK IMMEDIATE MEDICAL CARE IF:   You have severe back pain or lower abdominal pain.  You develop chills.  You have nausea or vomiting.  You have continued burning or discomfort with urination. MAKE SURE YOU:   Understand these instructions.  Will watch your condition.  Will get help right away if you are not doing well or get worse.   This information is not intended to replace advice given to you by your health care provider. Make sure you discuss  any questions you have with your health care provider.   Document Released: 08/07/2005 Document Revised: 07/19/2015 Document Reviewed: 12/06/2011 Elsevier Interactive Patient Education [employer]2016 Elsevier Inc.

## 2016-05-16 NOTE — ED Notes (Signed)
[facility] hitting forehead. EMS reports pt had an unwitnessed fall, found in fetal position with hematoma to forehead. Pt has hx of dementia and expressive aphasia. Pt is currently taking 75mg  of Plavix daily. Pt denies pain on arrival to ED.

## 2016-05-18 LAB — URINE CULTURE: Culture: >=100000 — AB

## 2016-05-21 IMAGING — CT CT HEAD WITHOUT CONTRAST
3 of 5 series · 12 of 33 positions shown, 14 images · non-contrast
Comparison: 11/11/2014; 10/29/2014

CLINICAL DATA: Unwitnessed fall. Left shoulder pain and left hip
pain. Bruising of the right side of the head from a prior fall.
Dementia.

EXAM:
CT HEAD WITHOUT CONTRAST
CT CERVICAL SPINE WITHOUT CONTRAST
TECHNIQUE: Multidetector CT imaging of the head and cervical spine was
performed following the standard protocol without intravenous
contrast. Multiplanar CT image reconstructions of the cervical spine
were also generated.

[Series 6: sag bone · sagittal · 0.23mm/px · 5 of 78 slices shown, 6 images]
[im 26/78  bone]
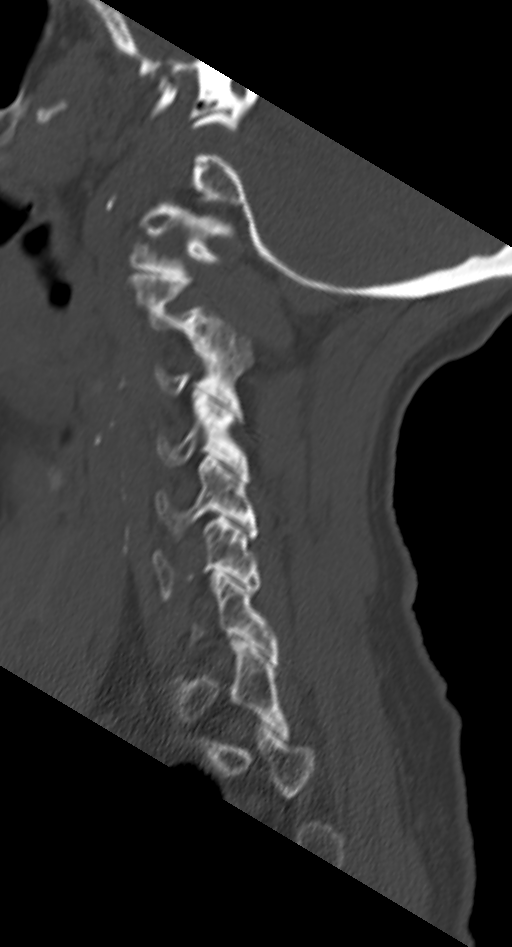
[im 33/78  bone]
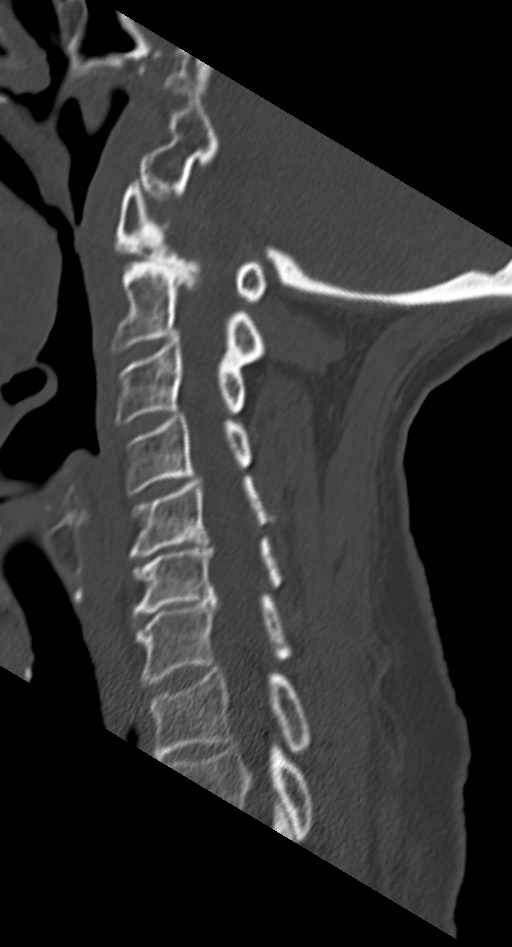
[im 39/78  soft-tissue]
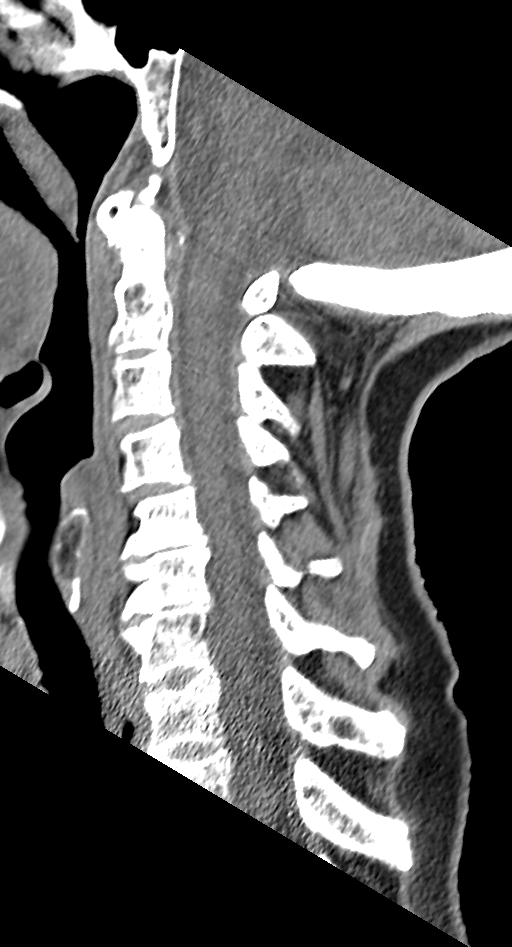
[im 39/78  bone]
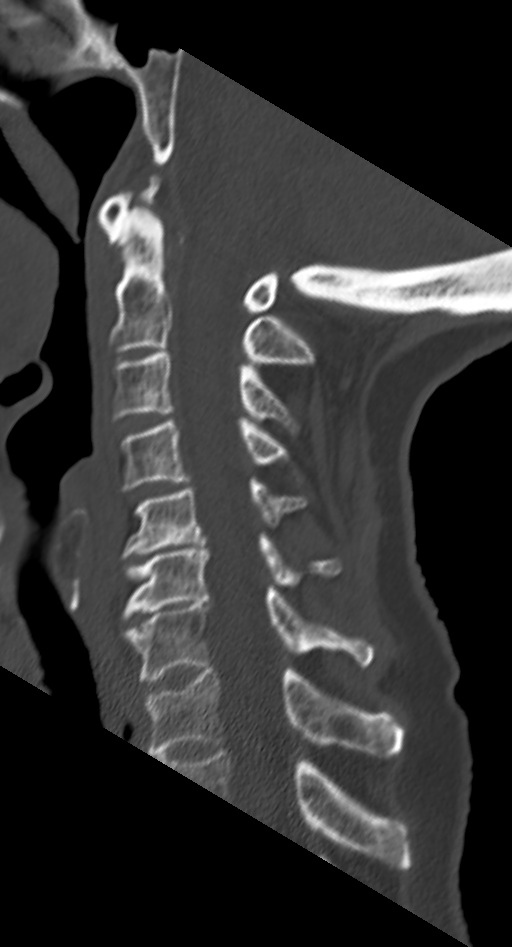
[im 45/78  bone]
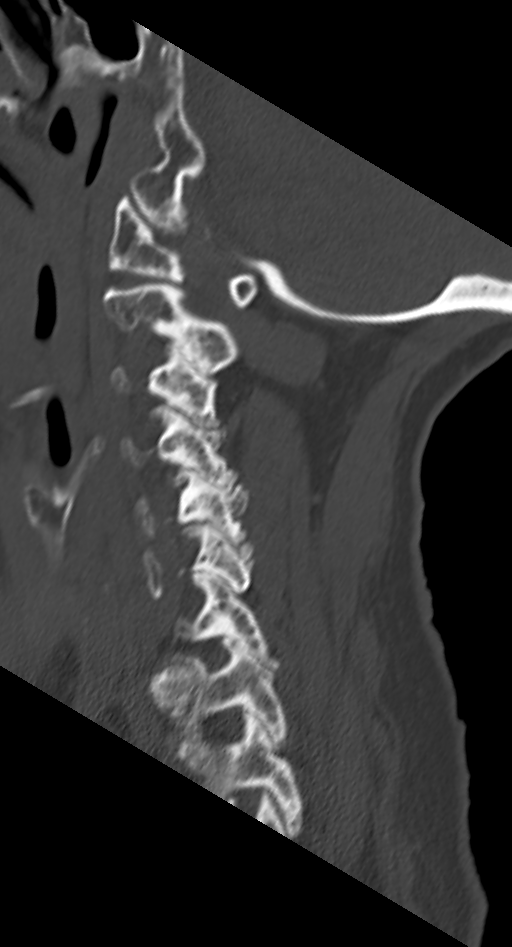
[im 52/78  bone]
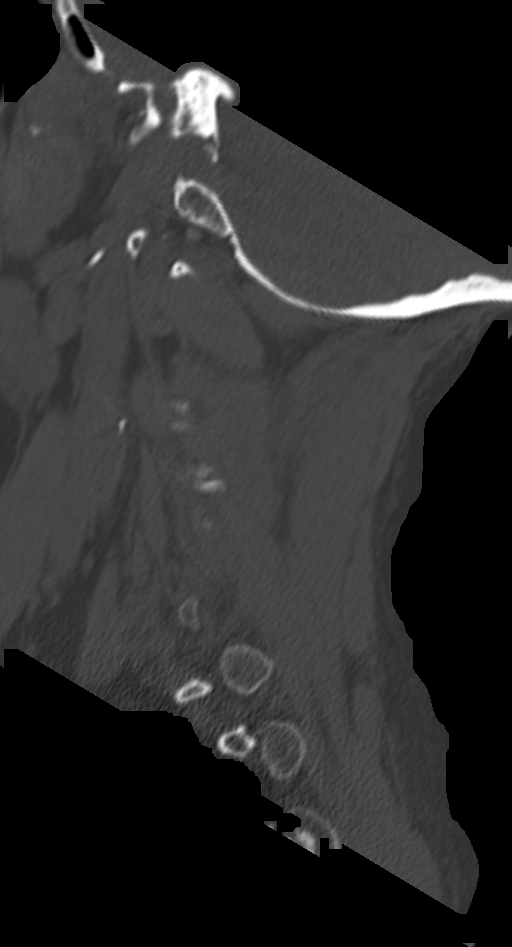

[Series 7: cor bone · coronal · 0.40mm/px · 3 of 67 slices shown]
[im 19/67  bone]
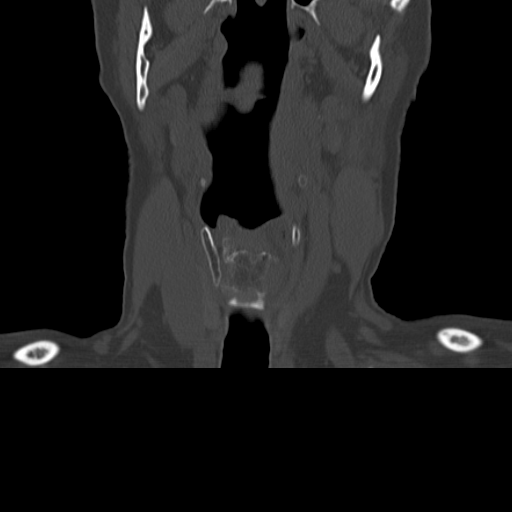
[im 29/67  bone]
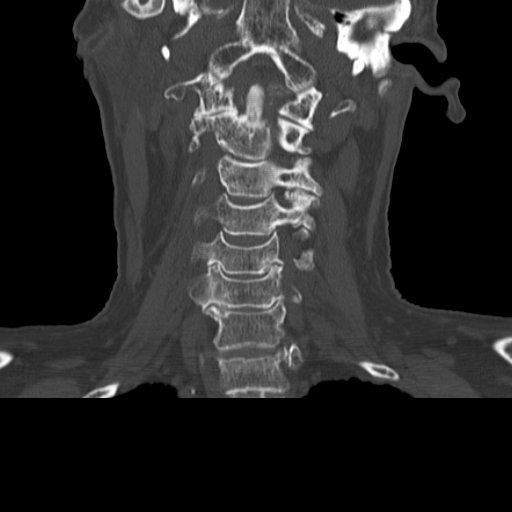
[im 38/67  bone]
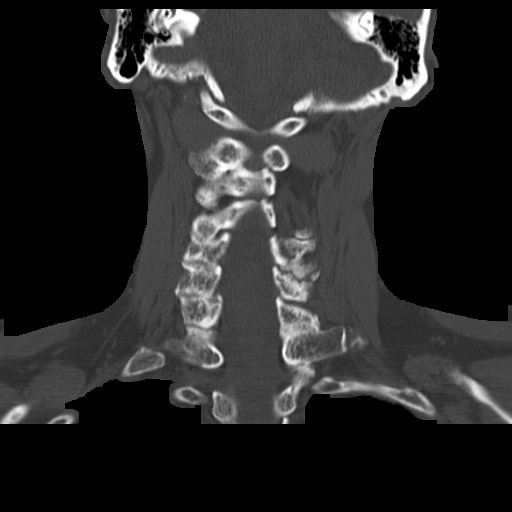

[Series 8: orthogonal axials · axial · 0.29mm/px · z∈[-363,-247]mm · 4 of 107 slices shown, 5 images]
[im 18/107  soft-tissue]
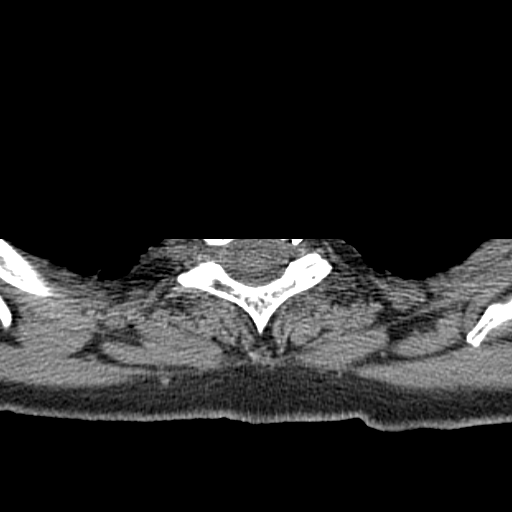
[im 18/107  bone]
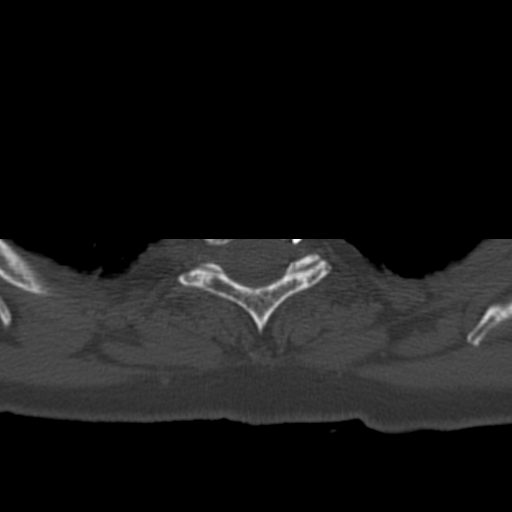
[im 36/107  bone]
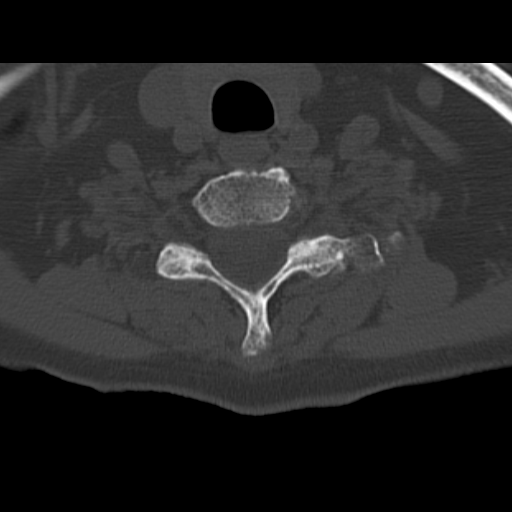
[im 71/107  bone]
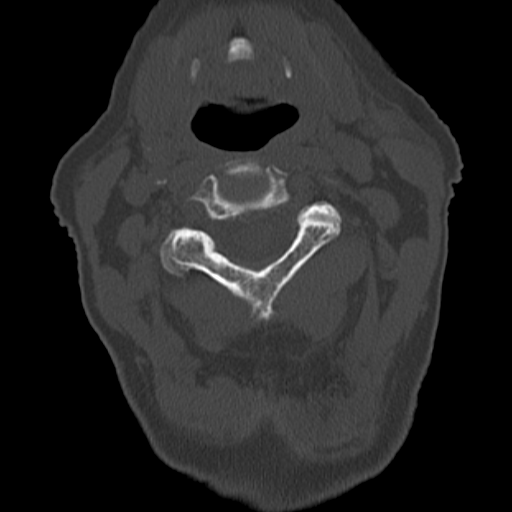
[im 89/107  bone]
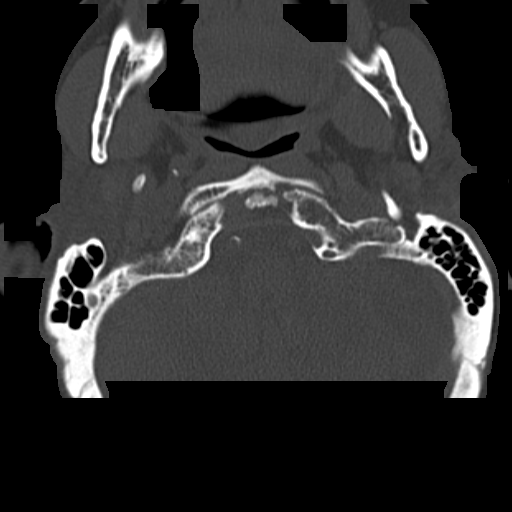

[12 of 33 positions shown; findings below may reference images not displayed]

FINDINGS: CT HEAD FINDINGS

Faint physiologic calcification of the globus pallidus nucleus
bilaterally. Small lacunar infarct of the right lentiform nucleus,
image 12 series 2, chronic.

Otherwise, The brainstem, cerebellum, cerebral peduncles, thalamus,
basal ganglia, basilar cisterns, and ventricular system appear
within normal limits. Periventricular white matter and corona
radiata hypodensities favor chronic ischemic microvascular white
matter disease. No intracranial hemorrhage, mass lesion, or acute
CVA.

Erosions of the right mandibular condyle are observed. There is
atherosclerotic calcification of the cavernous carotid arteries
bilaterally.

CT CERVICAL SPINE FINDINGS

Chronic fusion of the right C2-3 facet joint. Chronic fragmented
spurring of the tip of the odontoid. Chronic multilevel facet
arthropathy and multilevel uncinate spurring resulting in osseous
foraminal stenosis on the left at C3-4, C4-5, C5-6, and C6-7.

Loss of disc height at C5-6 and C6-7, as on prior exams. Chronic
superior and endplate wedging at T1, not changed from prior. No
acute fracture or acute subluxation observed. No prevertebral soft
tissue swelling.
IMPRESSION: 1. No acute intracranial findings are acute cervical spine findings.
2. Periventricular white matter and corona radiata hypodensities
favor chronic ischemic microvascular white matter disease.
3. Small remote lacunar infarct of the right inferior lentiform
nucleus.
4. Cervical spondylosis causing multilevel left foraminal
impingement.

## 2016-06-03 ENCOUNTER — Non-Acute Institutional Stay (SKILLED_NURSING_FACILITY): Payer: Medicare Other | Admitting: Gerontology

## 2016-06-03 DIAGNOSIS — J449 Chronic obstructive pulmonary disease, unspecified: Secondary | ICD-10-CM | POA: Diagnosis not present

## 2016-06-03 DIAGNOSIS — Z9981 Dependence on supplemental oxygen: Secondary | ICD-10-CM

## 2016-06-05 ENCOUNTER — Observation Stay
Admission: EM | Admit: 2016-06-05 | Discharge: 2016-06-06 | Disposition: A | Discharge status: Skilled Nursing Facility | Attending: Internal Medicine | Admitting: Internal Medicine

## 2016-06-05 ENCOUNTER — Emergency Department

## 2016-06-05 ENCOUNTER — Encounter: Payer: Self-pay | Admitting: Intensive Care

## 2016-06-05 DIAGNOSIS — S0003XA Contusion of scalp, initial encounter: Secondary | ICD-10-CM | POA: Diagnosis not present

## 2016-06-05 DIAGNOSIS — Z7902 Long term (current) use of antithrombotics/antiplatelets: Secondary | ICD-10-CM | POA: Insufficient documentation

## 2016-06-05 DIAGNOSIS — F0390 Unspecified dementia without behavioral disturbance: Secondary | ICD-10-CM | POA: Insufficient documentation

## 2016-06-05 DIAGNOSIS — R778 Other specified abnormalities of plasma proteins: Secondary | ICD-10-CM

## 2016-06-05 DIAGNOSIS — Z66 Do not resuscitate: Secondary | ICD-10-CM | POA: Diagnosis not present

## 2016-06-05 DIAGNOSIS — I7389 Other specified peripheral vascular diseases: Secondary | ICD-10-CM | POA: Insufficient documentation

## 2016-06-05 DIAGNOSIS — I482 Chronic atrial fibrillation: Secondary | ICD-10-CM | POA: Insufficient documentation

## 2016-06-05 DIAGNOSIS — R42 Dizziness and giddiness: Secondary | ICD-10-CM

## 2016-06-05 DIAGNOSIS — M109 Gout, unspecified: Secondary | ICD-10-CM | POA: Insufficient documentation

## 2016-06-05 DIAGNOSIS — K219 Gastro-esophageal reflux disease without esophagitis: Secondary | ICD-10-CM | POA: Insufficient documentation

## 2016-06-05 DIAGNOSIS — Z886 Allergy status to analgesic agent status: Secondary | ICD-10-CM | POA: Insufficient documentation

## 2016-06-05 DIAGNOSIS — M1990 Unspecified osteoarthritis, unspecified site: Secondary | ICD-10-CM | POA: Diagnosis not present

## 2016-06-05 DIAGNOSIS — I700 Atherosclerosis of aorta: Secondary | ICD-10-CM | POA: Diagnosis not present

## 2016-06-05 DIAGNOSIS — Z807 Family history of other malignant neoplasms of lymphoid, hematopoietic and related tissues: Secondary | ICD-10-CM | POA: Insufficient documentation

## 2016-06-05 DIAGNOSIS — Z8249 Family history of ischemic heart disease and other diseases of the circulatory system: Secondary | ICD-10-CM | POA: Insufficient documentation

## 2016-06-05 DIAGNOSIS — Z90710 Acquired absence of both cervix and uterus: Secondary | ICD-10-CM | POA: Insufficient documentation

## 2016-06-05 DIAGNOSIS — F418 Other specified anxiety disorders: Secondary | ICD-10-CM | POA: Diagnosis not present

## 2016-06-05 DIAGNOSIS — Z96651 Presence of right artificial knee joint: Secondary | ICD-10-CM | POA: Insufficient documentation

## 2016-06-05 DIAGNOSIS — Z9181 History of falling: Secondary | ICD-10-CM | POA: Diagnosis not present

## 2016-06-05 DIAGNOSIS — W19XXXA Unspecified fall, initial encounter: Secondary | ICD-10-CM | POA: Diagnosis not present

## 2016-06-05 DIAGNOSIS — I509 Heart failure, unspecified: Secondary | ICD-10-CM | POA: Diagnosis not present

## 2016-06-05 DIAGNOSIS — Z87891 Personal history of nicotine dependence: Secondary | ICD-10-CM | POA: Diagnosis not present

## 2016-06-05 DIAGNOSIS — Z950 Presence of cardiac pacemaker: Secondary | ICD-10-CM | POA: Insufficient documentation

## 2016-06-05 DIAGNOSIS — E119 Type 2 diabetes mellitus without complications: Secondary | ICD-10-CM | POA: Insufficient documentation

## 2016-06-05 DIAGNOSIS — I69320 Aphasia following cerebral infarction: Secondary | ICD-10-CM | POA: Diagnosis not present

## 2016-06-05 DIAGNOSIS — Z880 Allergy status to penicillin: Secondary | ICD-10-CM | POA: Insufficient documentation

## 2016-06-05 DIAGNOSIS — Z9981 Dependence on supplemental oxygen: Secondary | ICD-10-CM | POA: Insufficient documentation

## 2016-06-05 DIAGNOSIS — I110 Hypertensive heart disease with heart failure: Secondary | ICD-10-CM | POA: Diagnosis not present

## 2016-06-05 DIAGNOSIS — Z9049 Acquired absence of other specified parts of digestive tract: Secondary | ICD-10-CM | POA: Insufficient documentation

## 2016-06-05 DIAGNOSIS — R748 Abnormal levels of other serum enzymes: Secondary | ICD-10-CM | POA: Insufficient documentation

## 2016-06-05 DIAGNOSIS — J449 Chronic obstructive pulmonary disease, unspecified: Secondary | ICD-10-CM | POA: Insufficient documentation

## 2016-06-05 DIAGNOSIS — I272 Other secondary pulmonary hypertension: Secondary | ICD-10-CM | POA: Diagnosis not present

## 2016-06-05 DIAGNOSIS — Z79899 Other long term (current) drug therapy: Secondary | ICD-10-CM | POA: Insufficient documentation

## 2016-06-05 DIAGNOSIS — I422 Other hypertrophic cardiomyopathy: Secondary | ICD-10-CM | POA: Insufficient documentation

## 2016-06-05 DIAGNOSIS — Z882 Allergy status to sulfonamides status: Secondary | ICD-10-CM | POA: Insufficient documentation

## 2016-06-05 DIAGNOSIS — E7800 Pure hypercholesterolemia, unspecified: Secondary | ICD-10-CM | POA: Diagnosis not present

## 2016-06-05 DIAGNOSIS — R7989 Other specified abnormal findings of blood chemistry: Secondary | ICD-10-CM

## 2016-06-05 DIAGNOSIS — Z888 Allergy status to other drugs, medicaments and biological substances status: Secondary | ICD-10-CM | POA: Insufficient documentation

## 2016-06-05 DIAGNOSIS — Z9889 Other specified postprocedural states: Secondary | ICD-10-CM | POA: Insufficient documentation

## 2016-06-05 LAB — BASIC METABOLIC PANEL
Anion gap: 8 (ref 5–15)
BUN: 26 mg/dL — ABNORMAL HIGH (ref 6–20)
CALCIUM: 9.7 mg/dL (ref 8.9–10.3)
CO2: 30 mmol/L (ref 22–32)
CREATININE: 0.8 mg/dL (ref 0.44–1.00)
Chloride: 102 mmol/L (ref 101–111)
GFR calc non Af Amer: >60 mL/min (ref >60)
Glucose, Bld: 125 mg/dL — ABNORMAL HIGH (ref 65–99)
Potassium: 3.5 mmol/L (ref 3.5–5.1)
Sodium: 140 mmol/L (ref 135–145)

## 2016-06-05 LAB — CBC WITH DIFFERENTIAL/PLATELET
BASOS PCT: 1 %
Basophils Absolute: 0 10*3/uL (ref 0–0.1)
EOS ABS: 0.3 10*3/uL (ref 0–0.7)
Eosinophils Relative: 6 %
HEMATOCRIT: 39.9 % (ref 35.0–47.0)
Hemoglobin: 13.7 g/dL (ref 12.0–16.0)
Lymphocytes Relative: 35 %
Lymphs Abs: 1.9 10*3/uL (ref 1.0–3.6)
MCH: 33.9 pg (ref 26.0–34.0)
MCHC: 34.4 g/dL (ref 32.0–36.0)
MCV: 98.6 fL (ref 80.0–100.0)
MONO ABS: 0.5 10*3/uL (ref 0.2–0.9)
MONOS PCT: 10 %
NEUTROS ABS: 2.6 10*3/uL (ref 1.4–6.5)
Neutrophils Relative %: 48 %
Platelets: 201 10*3/uL (ref 150–440)
RBC: 4.05 MIL/uL (ref 3.80–5.20)
RDW: 13.4 % (ref 11.5–14.5)
WBC: 5.4 10*3/uL (ref 3.6–11.0)

## 2016-06-05 LAB — URINALYSIS COMPLETE WITH MICROSCOPIC (ARMC ONLY)
BACTERIA UA: NONE SEEN
Bilirubin Urine: NEGATIVE
GLUCOSE, UA: NEGATIVE mg/dL
Hgb urine dipstick: NEGATIVE
Ketones, ur: NEGATIVE mg/dL
Leukocytes, UA: NEGATIVE
Nitrite: NEGATIVE
Protein, ur: NEGATIVE mg/dL
SPECIFIC GRAVITY, URINE: 1.009 (ref 1.005–1.030)
pH: 8 (ref 5.0–8.0)

## 2016-06-05 LAB — MRSA PCR SCREENING: MRSA by PCR: NEGATIVE

## 2016-06-05 LAB — TROPONIN I
TROPONIN I: 0.04 ng/mL — AB (ref <0.03)
Troponin I: 0.05 ng/mL (ref <0.03)

## 2016-06-05 MED ORDER — OXYCODONE HCL 5 MG PO TABS: 5.0000 mg | ORAL_TABLET | ORAL | Status: DC | PRN

## 2016-06-05 MED ORDER — ENOXAPARIN SODIUM 40 MG/0.4ML ~~LOC~~ SOLN
40.0000 mg | SUBCUTANEOUS | Status: DC
  Administered 2016-06-05: 40 mg via SUBCUTANEOUS
  Filled 2016-06-05: qty 0.4

## 2016-06-05 MED ORDER — OXYBUTYNIN CHLORIDE ER 5 MG PO TB24
15.0000 mg | ORAL_TABLET | Freq: Every morning | ORAL | Status: DC
  Filled 2016-06-05: qty 1

## 2016-06-05 MED ORDER — ALPRAZOLAM 0.25 MG PO TABS
0.2500 mg | ORAL_TABLET | Freq: Two times a day (BID) | ORAL | Status: DC
  Administered 2016-06-06: 0.25 mg via ORAL
  Filled 2016-06-05: qty 1

## 2016-06-05 MED ORDER — CLOPIDOGREL BISULFATE 75 MG PO TABS
75.0000 mg | ORAL_TABLET | Freq: Every day | ORAL | Status: DC
  Administered 2016-06-05: 75 mg via ORAL
  Filled 2016-06-05: qty 1

## 2016-06-05 MED ORDER — ONDANSETRON HCL 4 MG PO TABS: 4.0000 mg | ORAL_TABLET | Freq: Four times a day (QID) | ORAL | Status: DC | PRN

## 2016-06-05 MED ORDER — ONDANSETRON HCL 4 MG/2ML IJ SOLN: 4.0000 mg | Freq: Four times a day (QID) | INTRAMUSCULAR | Status: DC | PRN

## 2016-06-05 MED ORDER — HALOPERIDOL 0.5 MG PO TABS
0.5000 mg | ORAL_TABLET | Freq: Three times a day (TID) | ORAL | Status: DC | PRN
  Filled 2016-06-05: qty 1

## 2016-06-05 MED ORDER — ACETAMINOPHEN 325 MG PO TABS
650.0000 mg | ORAL_TABLET | Freq: Four times a day (QID) | ORAL | Status: DC
  Administered 2016-06-06: 650 mg via ORAL
  Filled 2016-06-05: qty 2

## 2016-06-05 MED ORDER — SENNA 8.6 MG PO TABS
1.0000 | ORAL_TABLET | Freq: Two times a day (BID) | ORAL | Status: DC
  Administered 2016-06-05: 8.6 mg via ORAL
  Filled 2016-06-05: qty 1

## 2016-06-05 MED ORDER — BENZOCAINE-MENTHOL 6-10 MG MT LOZG
1.0000 | LOZENGE | OROMUCOSAL | Status: DC | PRN
  Filled 2016-06-05: qty 18

## 2016-06-05 MED ORDER — SODIUM CHLORIDE 0.9% FLUSH
3.0000 mL | Freq: Two times a day (BID) | INTRAVENOUS | Status: DC
  Administered 2016-06-05 (×2): 3 mL via INTRAVENOUS

## 2016-06-05 MED ORDER — ACETAMINOPHEN 325 MG PO TABS
650.0000 mg | ORAL_TABLET | Freq: Four times a day (QID) | ORAL | Status: DC | PRN
Start: 2016-06-05 — End: 2016-06-06

## 2016-06-05 MED ORDER — DIVALPROEX SODIUM 125 MG PO CSDR
250.0000 mg | DELAYED_RELEASE_CAPSULE | Freq: Two times a day (BID) | ORAL | Status: DC
  Administered 2016-06-05: 250 mg via ORAL
  Filled 2016-06-05 (×3): qty 2

## 2016-06-05 MED ORDER — MIRTAZAPINE 15 MG PO TBDP
15.0000 mg | ORAL_TABLET | Freq: Every day | ORAL | Status: DC
  Administered 2016-06-06: 15 mg via ORAL
  Filled 2016-06-05 (×2): qty 1

## 2016-06-05 MED ORDER — DEXTROMETHORPHAN-GUAIFENESIN 10-100 MG/5ML PO LIQD
10.0000 mL | Freq: Four times a day (QID) | ORAL | Status: DC | PRN
  Filled 2016-06-05: qty 10

## 2016-06-05 MED ORDER — ACETAMINOPHEN 650 MG RE SUPP: 650.0000 mg | Freq: Four times a day (QID) | RECTAL | Status: DC | PRN

## 2016-06-05 MED ORDER — DILTIAZEM HCL ER COATED BEADS 180 MG PO CP24
180.0000 mg | ORAL_CAPSULE | Freq: Every day | ORAL | Status: DC
  Administered 2016-06-05: 180 mg via ORAL
  Filled 2016-06-05: qty 1

## 2016-06-05 MED ORDER — DILTIAZEM HCL ER BEADS 180 MG PO CP24: 180.0000 mg | ORAL_CAPSULE | Freq: Every day | ORAL | Status: DC

## 2016-06-05 NOTE — ED Notes (Signed)
Attempted to call report, RN is currently taking report on another pt. Given RN name and number with direction to call back.

## 2016-06-05 NOTE — ED Provider Notes (Signed)
[facility] Emergency Department Provider Note  ____________________________________________  Time seen: Approximately 7:30 AM  I have reviewed the triage vital signs and the nursing notes.   HISTORY  Chief Complaint Fall   HPI Ulyana Hardbarger Dysinger is a 80 y.o. female the history of atrial fibrillation, CVA on Plavix, dementia, HTN, expressive aphasia, and history of multiple falls who presents for unwitnessed fall from her nursing home. Patient was found crawled on the floor in the fetal position next to the bed. She has a hematoma to her right forehead however patient was seen here on 05/16/16 was noted to have that same bruising her forehead. Patient has an expressive aphasia therefore history is limited. She is able to answer yes or no to certain questions. She denies pain at this time. She reports that she does not remembers the fall. She is unable to tell me if she felt dizzy or had any preceding symptoms such as CP, palpitations, SOB prior to the fall.  Past Medical History:  Diagnosis Date  . Anemia    "as a child" (08/05/2013)  . Anxiety   . Arthritis    "all over me; in all my joints" (08/05/2013)  . Atrial fibrillation (HCC)    a. Chronic; No longer on coumadin 2/2 frequent falls.  . Chest pain    a. 08/2011 Myoview: sm, fixed anteroseptal defect (attenuation), no ischemia, EF 62%.  . CHF (congestive heart failure) (HCC)   . Coccyx pain   . COPD (chronic obstructive pulmonary disease) (HCC)   . Dementia   . Depression   . Falls frequently    coumadin stopped  . GERD (gastroesophageal reflux disease)   . Gout    "right foot" (08/05/2013)  . H/O hiatal hernia   . History of pneumonia    "couple times; long time ago" (08/05/2013)  . Hypercholesterolemia   . Hypertension   . Hypertrophic cardiomyopathy (HCC)    a. 11/2013 Echo: EF 55-60%, basal inf HK, sev dil LA, no evidence of HCM.  PASP .  [name] Hyponatremia   . Hyposmolality   . Osteoarthritis     . Oxygen dependent    "3L 24/7" (08/05/2013)  . Pacemaker    a. 04/2012 MDT [id number] [employer] PPM, ser #: [id number] H.  . Pulmonary HTN (HCC)    a. has had prior right heart cath in 2010; was felt that most likely due to elevated left sided pressures and would not benefit from vasodilator therapy;  b. 11/2013 Echo: PASP .  . Situational mixed anxiety and depressive disorder   . TIA (transient ischemic attack)   . Type II diabetes mellitus (HCC)   . Varicose veins     Patient Active Problem List   Diagnosis Date Noted  . Paroxysmal atrial fibrillation (HCC)   . Encounter for palliative care   . Goals of care, counseling/discussion   . Altered mental state   . Cerebrovascular accident (CVA) due to embolism of left middle cerebral artery (HCC)   . Dementia   . Facial asymmetry 02/01/2016  . Altered mental status 02/01/2016  . Closed head injury 05/30/2014  . Pulmonary hypertension, moderate to severe (HCC) 05/30/2014  . DM (diabetes mellitus), type 2 with neurological complications (HCC) 05/30/2014  . Hypoglycemia 05/29/2014  . Hypokalemia 05/29/2014  . Subdural hematoma caused by concussion (HCC) 05/28/2014  . hemorrhagic cerebral contusion 05/28/2014  . Dyslipidemia 03/07/2014  . Chronic respiratory failure (HCC) 01/01/2014  . Chest pain   . UTI (urinary  tract infection) 11/19/2013  . Compression fracture of L4 lumbar vertebra (HCC) 11/18/2013  . Syncope 11/18/2013  . Protein-calorie malnutrition, severe (HCC) 11/18/2013  . Syncope and collapse 11/17/2013  . Closed compression fracture of L4 lumbar vertebra (HCC) 11/17/2013  . Gait difficulty 10/05/2013  . Fall (on)(from) incline, initial encounter 10/03/2013  . Essential hypertension, benign 10/03/2013  . Unsteady gait 10/03/2013  . Peripheral neuropathy (HCC) 10/03/2013  . Back pain 08/06/2013  . Near syncope 08/06/2013  . Pulmonary hypertension (HCC) 08/05/2013  . Abnormality of gait 05/24/2013  . Acute respiratory  distress (HCC) 05/08/2013  . Pulmonary edema, acute (HCC) 05/08/2013  . Benign hypertensive heart disease without heart failure   . Pulmonary HTN (HCC)   . Oxygen dependent   . COPD (chronic obstructive pulmonary disease) (HCC)   . Fatigue due to cardiopulmonary and deconditioning 04/23/2013  . Memory deficit 02/07/2013  . Memory change 12/11/2012  . TIA (transient ischemic attack) 02/19/2012  . Chest pain at rest 08/05/2011  . Type II or unspecified type diabetes mellitus without mention of complication, uncontrolled 06/12/2011  . Fall 03/22/2011  . Mitral regurgitation 03/13/2011  . Hypertrophic cardiomyopathy (HCC)   . Osteoarthritis   . Situational mixed anxiety and depressive disorder   . Pacemaker 02/26/2011  . Atrial fibrillation (HCC) 01/28/2011  . CHRONIC OBSTRUCTIVE PULMONARY DISEASE, MODERATE 05/23/2010  . DEPRESSIVE DISORDER NOT ELSEWHERE CLASSIFIED 04/11/2010  . HYPERLIPIDEMIA 05/31/2009  . Essential hypertension 05/31/2009  . ALLERGIC RHINITIS 05/31/2009  . SHORTNESS OF BREATH 05/31/2009  . Cough 05/31/2009    Past Surgical History:  Procedure Laterality Date  . ABDOMINAL HYSTERECTOMY     "partial" (08/05/2013)  . APPENDECTOMY    . BACK SURGERY     "bone spur removed; mid back" (08/05/2013)  . CARDIAC CATHETERIZATION     "more than once" (08/05/2013)  . DILATION AND CURETTAGE OF UTERUS     "several" (08/05/2013)  . FOOT NEUROMA SURGERY Right   . INSERT / REPLACE / REMOVE PACEMAKER  1998; 2000's; 2014  . PACEMAKER GENERATOR CHANGE N/A 04/24/2012   Procedure: PACEMAKER GENERATOR CHANGE;  Surgeon: Duke Salvia, MD;  Location: [facility] CATH LAB;  Service: Cardiovascular;  Laterality: N/A;  . TONSILLECTOMY    . TOTAL KNEE ARTHROPLASTY Right 2011      Allergies Aspirin; Calan [verapamil hcl]; Crestor [rosuvastatin calcium]; Escitalopram oxalate; Lipitor [atorvastatin calcium]; Lopressor [metoprolol tartrate]; Nitroglycerin; Norvasc [amlodipine besylate]; Nsaids;  Oxycodone; Prednisone; Sulfa antibiotics; Vimovo [naproxen-esomeprazole]; and Penicillins  Family History  Problem Relation Age of Onset  . Other Father     died in plane crash  . Cancer Mother     lymphoma-NHL  . Coronary artery disease Grandchild   . Heart disease Maternal Grandmother     Social History Social History  Substance Use Topics  . Smoking status: Former Smoker    Packs/day: 1.00    Years: 40.00    Types: Cigarettes    Quit date: 11/15/2005  . Smokeless tobacco: Never Used  . Alcohol use No    Review of Systems  Constitutional: Negative for fever. Eyes: Negative for visual changes. ENT: Negative for sore throat. + forehead hematoma Cardiovascular: Negative for chest pain. Respiratory: Negative for shortness of breath. Gastrointestinal: Negative for abdominal pain, vomiting or diarrhea. Genitourinary: Negative for dysuria. Musculoskeletal: Negative for back pain. Skin: Negative for rash. Neurological: Negative for headaches, weakness or numbness.  ____________________________________________   PHYSICAL EXAM:  VITAL SIGNS: ED Triage Vitals [06/05/16 0719]  Enc Vitals Group  BP (!) 188/79     Pulse Rate 78     Resp 20     Temp 97.6 F (36.4 C)     Temp Source Oral     SpO2 97 %     Weight 120 lb 11.2 oz (54.7 kg)     Height      Head Circumference      Peak Flow      Pain Score      Pain Loc      Pain Edu?      Excl. in GC?    Full spinal precautions maintained throughout the trauma exam. Constitutional: Alert, expressive aphasia. No distress HEENT Head: Normocephalic and R forehead hematoma. Face: No facial bony tenderness. Stable midface Ears: No hemotympanum bilaterally. Eyes: No eye injury. PERRL. Nose: Nontender. No epistaxis. Mouth/Throat: Mucous membranes are moist. No oropharyngeal blood. No dental injury. Airway patent without stridor. Normal voice. Neck: C-collar in place. No midline c-spine tenderness.  Cardiovascular:  Normal rate, regular rhythm. Normal and symmetric distal pulses are present in all extremities. Pulmonary/Chest: Chest wall is stable and nontender to palpation/compression. Normal respiratory effort. Breath sounds are normal. No crepitus.  Abdominal: Soft, nontender, non distended. Musculoskeletal: Nontender with normal full range of motion in all extremities. No deformities. No thoracic or lumbar midline spinal tenderness. Pelvis is stable. Skin: Skin is warm, dry and intact. No abrasions or contutions. Neurological: Expressive aphasia. Moves all extremities to command. No gross focal neurologic deficits are appreciated.  Glascow Coma Score: 4 - Opens eyes on own 6 - Follows simple motor commands 5 - Alert and oriented GCS: 15   ____________________________________________   LABS (all labs ordered are listed, but only abnormal results are displayed)  Labs Reviewed  BASIC METABOLIC PANEL - Abnormal; Notable for the following:       Result Value   Glucose, Bld 125 (*)    BUN 26 (*)    All other components within normal limits  TROPONIN I - Abnormal; Notable for the following:    Troponin I 0.05 (*)    All other components within normal limits  URINALYSIS COMPLETEWITH MICROSCOPIC ([facility] ONLY) - Abnormal; Notable for the following:    Color, Urine STRAW (*)    APPearance CLEAR (*)    Squamous Epithelial / LPF 0-5 (*)    All other components within normal limits  TROPONIN I - Abnormal; Notable for the following:    Troponin I 0.04 (*)    All other components within normal limits  URINE CULTURE  MRSA PCR SCREENING  CBC WITH DIFFERENTIAL/PLATELET  TROPONIN I  TROPONIN I   ____________________________________________  EKG  ED ECG REPORT I, Nita Sickle, the attending physician, personally viewed and interpreted this ECG.  Ventricular paced, rate of 77, normal intervals, normal axis, T-wave inversions on inferior lateral leads which are the same from  01/2016. ____________________________________________  RADIOLOGY  Head CT: negative C-spine CT: negative CXR: negative ____________________________________________   PROCEDURES  Procedure(s) performed: None Procedures Critical Care performed:  None ____________________________________________   INITIAL IMPRESSION / ASSESSMENT AND PLAN / ED COURSE  80 y.o. female the history of atrial fibrillation, CVA on Plavix, dementia, HTN, expressive aphasia, and history of multiple falls who presents for unwitnessed fall from her nursing home. History is limited due to patient's a facial therefore proceed with a medical workup as the fall was unwitnessed. Only obvious trauma is a right forehead hematoma although that has been documented prior on a visit from 7/6. She  has no other evidence of trauma or pain anywhere else. We'll get a head CT, CT cervical spine, EKG, labs, UA, troponin, and observation on cardiac monitor  Clinical Course  Comment By Time  CT head and cervical spine with no acute findings. C-collar cleared and patient remains with no midline ttp. Patient endorsing now that she felt dizzy on her way to the bathroom and felt like she was going to pass out but does not remember if she did pass out. Her troponin here is elevated at 0.05. She has no CP or SOB and her EKG is unchanged from prior. Will discuss with hospitalist for admission to trend enzymes. Nita Sickle, MD 07/26 0840  UA with no evidence of UTI. Will discuss with the hospitalist for admission for near syncope and elevated troponin. Nita Sickle, MD 07/26 [phone]    Pertinent labs & imaging results that were available during my care of the patient were reviewed by me and considered in my medical decision making (see chart for details).    ____________________________________________   FINAL CLINICAL IMPRESSION(S) / ED DIAGNOSES  Final diagnoses:  Fall, initial encounter  Lightheadedness  Elevated troponin       NEW MEDICATIONS STARTED DURING THIS VISIT:  Current Discharge Medication List       Note:  This document was prepared using Dragon voice recognition software and may include unintentional dictation errors.    Nita Sickle, MD 06/05/16 1246

## 2016-06-05 NOTE — ED Triage Notes (Signed)
Patient arrived by EMS from [place]. According to staff patient needs assistance when getting up. Patient got up unassisted this morning to go to bathroom and had unwitnessed fall. Patient takes plavix and has HX of HTN, dementia, CVA. Patient c/o dizziness and pain in her head. Patient has large hematoma on R forehead. A&O x1 to self

## 2016-06-05 NOTE — ED Notes (Signed)
Patient transported to X-ray 

## 2016-06-05 NOTE — H&P (Signed)
[facility] - [place] at [facility]   PATIENT NAME: [name] [name]    MR#:  [id number]  DATE OF BIRTH:  1936/06/25  DATE OF ADMISSION:  06/05/2016  PRIMARY CARE PHYSICIAN: Dr Dareen Piano  REQUESTING/REFERRING PHYSICIAN: Dr Don Perking  CHIEF COMPLAINT:  Fall at [facility]  HISTORY OF PRESENT ILLNESS:  [name] [name]  is a 80 y.o. female with a known history of Chronic dementia and is a resident at [name] comes to the emergency room after she had an unwitnessed fall near the nurses station at [place]. She complained of the right skull pain and was found to have right frontal hematoma. She has some expressive aphasia at baseline from prior stroke but is able to communicate well. She currently is taking Plavix. Denies any chest pain shortness of breath and abdominal pain. Internal medicine was consulted patient's troponin was 0.05. No history of coronary artery disease. Patient has history of chronic A. fib and a pacemaker.  Patient's daughter was present in the emergency room.  PAST MEDICAL HISTORY:   Past Medical History:  Diagnosis Date  . Anemia    "as a child" (08/05/2013)  . Anxiety   . Arthritis    "all over me; in all my joints" (08/05/2013)  . Atrial fibrillation (HCC)    a. Chronic; No longer on coumadin 2/2 frequent falls.  . Chest pain    a. 08/2011 Myoview: sm, fixed anteroseptal defect (attenuation), no ischemia, EF 62%.  . CHF (congestive heart failure) (HCC)   . Coccyx pain   . COPD (chronic obstructive pulmonary disease) (HCC)   . Dementia   . Depression   . Falls frequently    coumadin stopped  . GERD (gastroesophageal reflux disease)   . Gout    "right foot" (08/05/2013)  . H/O hiatal hernia   . History of pneumonia    "couple times; long time ago" (08/05/2013)  . Hypercholesterolemia   . Hypertension   . Hypertrophic cardiomyopathy (HCC)    a. 11/2013 Echo: EF 55-60%, basal inf HK, sev dil LA, no evidence of HCM.  PASP .  [name] Hyponatremia    . Hyposmolality   . Osteoarthritis   . Oxygen dependent    "3L 24/7" (08/05/2013)  . Pacemaker    a. 04/2012 MDT [id number] [employer] PPM, ser #: [id number] H.  . Pulmonary HTN (HCC)    a. has had prior right heart cath in 2010; was felt that most likely due to elevated left sided pressures and would not benefit from vasodilator therapy;  b. 11/2013 Echo: PASP .  . Situational mixed anxiety and depressive disorder   . TIA (transient ischemic attack)   . Type II diabetes mellitus (HCC)   . Varicose veins     PAST SURGICAL HISTOIRY:   Past Surgical History:  Procedure Laterality Date  . ABDOMINAL HYSTERECTOMY     "partial" (08/05/2013)  . APPENDECTOMY    . BACK SURGERY     "bone spur removed; mid back" (08/05/2013)  . CARDIAC CATHETERIZATION     "more than once" (08/05/2013)  . DILATION AND CURETTAGE OF UTERUS     "several" (08/05/2013)  . FOOT NEUROMA SURGERY Right   . INSERT / REPLACE / REMOVE PACEMAKER  1998; 2000's; 2014  . PACEMAKER GENERATOR CHANGE N/A 04/24/2012   Procedure: PACEMAKER GENERATOR CHANGE;  Surgeon: Duke Salvia, MD;  [facility] LAB;  Service: Cardiovascular;  Laterality: N/A;  . TONSILLECTOMY    . TOTAL KNEE ARTHROPLASTY Right 2011  SOCIAL HISTORY:   Social History  Substance Use Topics  . Smoking status: Former Smoker    Packs/day: 1.00    Years: 40.00    Types: Cigarettes    Quit date: 11/15/2005  . Smokeless tobacco: Never Used  . Alcohol use No    FAMILY HISTORY:   Family History  Problem Relation Age of Onset  . Other Father     died in plane crash  . Cancer Mother     lymphoma-NHL  . Coronary artery disease Grandchild   . Heart disease Maternal Grandmother     DRUG ALLERGIES:   Allergies  Allergen Reactions  . Aspirin Nausea And Vomiting  . Calan [Verapamil Hcl] Other (See Comments)    Patient states it makes her "out of her mind"; bp bottoms out (takes diltiazem at home)  . Crestor [Rosuvastatin Calcium] Other (See  Comments)    Muscle pain  . Escitalopram Oxalate Other (See Comments)    Reaction Unknown   . Lipitor [Atorvastatin Calcium] Other (See Comments)    myalgia  . Lopressor [Metoprolol Tartrate] Other (See Comments)    Blood pressure bottoms out   . Nitroglycerin Other (See Comments)    Reaction unknown  . Norvasc [Amlodipine Besylate] Other (See Comments)    fatigue  . Nsaids Nausea Only  . Oxycodone Other (See Comments)    Reaction unknown  . Prednisone Swelling  . Sulfa Antibiotics Nausea Only and Other (See Comments)    Makes her stomach hurt  . Vimovo [Naproxen-Esomeprazole] Nausea Only  . Penicillins Hives and Rash    REVIEW OF SYSTEMS:  Review of Systems  Musculoskeletal: Positive for falls.  Neurological: Positive for weakness.  All other systems reviewed and are negative.    MEDICATIONS AT HOME:   Prior to Admission medications   Medication Sig Start Date End Date Taking? Authorizing Provider  acetaminophen (TYLENOL) 325 MG tablet Take 650 mg by mouth 4 (four) times daily.    Yes Historical Provider, MD  ALPRAZolam (XANAX) 0.25 MG tablet Take 0.25 mg by mouth 2 (two) times daily.   Yes Historical Provider, MD  clopidogrel (PLAVIX) 75 MG tablet Take 1 tablet (75 mg total) by mouth daily. 02/05/16  Yes Richarda Overlie, MD  diltiazem (TIAZAC) 180 MG 24 hr capsule Take 180 mg by mouth daily.   Yes Historical Provider, MD  divalproex (DEPAKOTE SPRINKLE) 125 MG capsule Take 250 mg by mouth 2 (two) times daily.   Yes Historical Provider, MD  haloperidol (HALDOL) 0.5 MG tablet Take 0.5 mg by mouth every 8 (eight) hours as needed for agitation.   Yes Historical Provider, MD  mirtazapine (REMERON SOL-TAB) 15 MG disintegrating tablet Take 15 mg by mouth at bedtime.   Yes Historical Provider, MD  oxybutynin (DITROPAN XL) 15 MG 24 hr tablet Take 15 mg by mouth every morning.    Yes Historical Provider, MD  senna (SENOKOT) 8.6 MG TABS tablet Take 1 tablet by mouth 2 (two) times daily.    Yes Historical Provider, MD  benzocaine-menthol (CHLORASEPTIC) 6-10 MG lozenge Take 1 lozenge by mouth every 2 (two) hours as needed for sore throat.     Historical Provider, MD  Dextromethorphan-Guaifenesin (DIABETIC TUSSIN MAX ST) 10-200 MG/5ML LIQD Take 10 mLs by mouth every 6 (six) hours as needed (for cough).    Historical Provider, MD  morphine (ROXANOL) 20 MG/ML concentrated solution Take 0.5 mLs (10 mg total) by mouth every 4 (four) hours as needed for severe pain. 02/05/16  Richarda Overlie, MD      VITAL SIGNS:  Blood pressure (!) 172/89, pulse 81, temperature 97.6 F (36.4 C), temperature source Oral, resp. rate 15, weight 54.7 kg (120 lb 11.2 oz), SpO2 96 %.  PHYSICAL EXAMINATION:  GENERAL:  80 y.o.-year-old patient lying in the bed with no acute distress.  EYES: Pupils equal, round, reactive to light and accommodation. No scleral icterus. Extraocular muscles intact.  HEENT: Head atraumatic, normocephalic. Oropharynx and nasopharynx clear. Right scalp hematoma ++ NECK:  Supple, no jugular venous distention. No thyroid enlargement, no tenderness.  LUNGS: Normal breath sounds bilaterally, no wheezing, rales,rhonchi or crepitation. No use of accessory muscles of respiration.  CARDIOVASCULAR: S1, S2 normal. No murmurs, rubs, or gallops.  ABDOMEN: Soft, nontender, nondistended. Bowel sounds present. No organomegaly or mass.  EXTREMITIES: No pedal edema, cyanosis, or clubbing.  NEUROLOGIC: Cranial nerves II through XII are intact. Muscle strength 5/5 in all extremities. Sensation intact. Gait not checked.  PSYCHIATRIC: patient is alert and oriented x 3.  SKIN: No obvious rash, lesion, or ulcer.   LABORATORY PANEL:   CBC  Recent Labs Lab 06/05/16 0726  WBC 5.4  HGB 13.7  HCT 39.9  PLT 201   ------------------------------------------------------------------------------------------------------------------  Chemistries   Recent Labs Lab 06/05/16 0726  NA 140  K 3.5  CL 102   CO2 30  GLUCOSE 125*  BUN 26*  CREATININE 0.80  CALCIUM 9.7   ------------------------------------------------------------------------------------------------------------------  Cardiac Enzymes  Recent Labs Lab 06/05/16 0726  TROPONINI 0.05*   ------------------------------------------------------------------------------------------------------------------  RADIOLOGY:  Dg Chest 2 View  Result Date: 06/05/2016 CLINICAL DATA:  Pain following fall EXAM: CHEST  2 VIEW COMPARISON:  October 04, 2015 and March 06, 2015 FINDINGS: There is no edema or consolidation. There is cardiomegaly with pulmonary vascularity within normal limits. Pacemaker leads are attached to the right atrium and middle cardiac vein, stable. No pneumothorax. No adenopathy. There is anterior wedging of a mid thoracic vertebral body, stable. There is atherosclerotic calcification in the aortic arch region. IMPRESSION: No edema or consolidation. Stable cardiomegaly. Aortic atherosclerosis. Electronically Signed   By: Bretta Bang III M.D.   On: 06/05/2016 08:43  Ct Head Wo Contrast  Result Date: 06/05/2016 CLINICAL DATA:  CT cervical spine: EXAM: CT HEAD WITHOUT CONTRAST CT CERVICAL SPINE WITHOUT CONTRAST TECHNIQUE: Multidetector CT imaging of the head and cervical spine was performed following the standard protocol without intravenous contrast. Multiplanar CT image reconstructions of the cervical spine were also generated. COMPARISON:  CT cervical spine October 03, 2015; head CT May 16, 2016 FINDINGS: CT HEAD FINDINGS There is moderate diffuse atrophy. There is no intracranial mass, hemorrhage, extra-axial fluid collection, or midline shift. There is evidence of a prior infarct at the left temporal - occipital junction, stable. There is evidence of a prior lacunar type infarct in the inferior right cerebellum, stable. There is small vessel disease throughout the centra semiovale bilaterally, stable. No acute infarct  evident. There is a right frontal scalp hematoma. The bony calvarium appears intact without fracture. The mastoid air cells are clear. Orbits appear symmetric bilaterally. There is mild mucosal thickening in several ethmoid air cells bilaterally. Visualized paranasal sinuses elsewhere clear. There are foci of calcification in both distal vertebral arteries as well as in the carotid siphon regions bilaterally. There are no hyperdense vessels. CT CERVICAL SPINE FINDINGS There is no demonstrable fracture or spondylolisthesis. Prevertebral soft tissues and predental space regions are normal. There is a Schmorl's node along the superior endplate of T1, stable.  There is moderately severe disc space narrowing at C5-6 and C6-7. There is slightly milder disc space narrowing at C7-T1 and T1-2. There is multilevel facet hypertrophy. There is moderate exit foraminal narrowing due to bony hypertrophy at C3-4 on the left, at C5-6 bilaterally, and at C6-7 bilaterally. There are foci of carotid artery calcification bilaterally. IMPRESSION: CT head: Moderate atrophy with extensive periventricular small vessel disease. Prior infarct left temporal -occipital junction. Prior small infarct inferior right cerebellum. No acute infarct evident. Multiple foci of arterial vascular calcification noted. There is a right frontal scalp hematoma. No fracture evident. No intracranial mass, hemorrhage, or extra-axial fluid collection. CT cervical spine: No acute fracture or spondylolisthesis. Multilevel arthropathy. There is bilateral carotid artery calcification. Electronically Signed   By: Bretta Bang III M.D.   On: 06/05/2016 08:24  Ct Cervical Spine Wo Contrast  Result Date: 06/05/2016 CLINICAL DATA:  CT cervical spine: EXAM: CT HEAD WITHOUT CONTRAST CT CERVICAL SPINE WITHOUT CONTRAST TECHNIQUE: Multidetector CT imaging of the head and cervical spine was performed following the standard protocol without intravenous contrast.  Multiplanar CT image reconstructions of the cervical spine were also generated. COMPARISON:  CT cervical spine October 03, 2015; head CT May 16, 2016 FINDINGS: CT HEAD FINDINGS There is moderate diffuse atrophy. There is no intracranial mass, hemorrhage, extra-axial fluid collection, or midline shift. There is evidence of a prior infarct at the left temporal - occipital junction, stable. There is evidence of a prior lacunar type infarct in the inferior right cerebellum, stable. There is small vessel disease throughout the centra semiovale bilaterally, stable. No acute infarct evident. There is a right frontal scalp hematoma. The bony calvarium appears intact without fracture. The mastoid air cells are clear. Orbits appear symmetric bilaterally. There is mild mucosal thickening in several ethmoid air cells bilaterally. Visualized paranasal sinuses elsewhere clear. There are foci of calcification in both distal vertebral arteries as well as in the carotid siphon regions bilaterally. There are no hyperdense vessels. CT CERVICAL SPINE FINDINGS There is no demonstrable fracture or spondylolisthesis. Prevertebral soft tissues and predental space regions are normal. There is a Schmorl's node along the superior endplate of T1, stable. There is moderately severe disc space narrowing at C5-6 and C6-7. There is slightly milder disc space narrowing at C7-T1 and T1-2. There is multilevel facet hypertrophy. There is moderate exit foraminal narrowing due to bony hypertrophy at C3-4 on the left, at C5-6 bilaterally, and at C6-7 bilaterally. There are foci of carotid artery calcification bilaterally. IMPRESSION: CT head: Moderate atrophy with extensive periventricular small vessel disease. Prior infarct left temporal -occipital junction. Prior small infarct inferior right cerebellum. No acute infarct evident. Multiple foci of arterial vascular calcification noted. There is a right frontal scalp hematoma. No fracture evident. No  intracranial mass, hemorrhage, or extra-axial fluid collection. CT cervical spine: No acute fracture or spondylolisthesis. Multilevel arthropathy. There is bilateral carotid artery calcification. Electronically Signed   By: Bretta Bang III M.D.   On: 06/05/2016 08:24   EKG:  No acute ST-T changes afib IMPRESSION AND PLAN:  Denni France  is a 80 y.o. female with a known history of Chronic dementia and is a resident at [facility] comes to the emergency room after she had an unwitnessed fall near the nurses station at [place]. She complained of the right skull pain and was found to have right frontal hematoma. She has some expressive aphasia at baseline from prior stroke but is able to communicate well.  1.Mechanical fall with right  skull hematoma. -Patient hemodynamically stable doing well. -CT head negative for new stroke. Patient has old chronic strokes. Right frontal hematoma. Monitor H&H.  -As needed pain meds -Physical therapy to see patient  2. Elevated troponin appears to demand ischemia. -Patient does not have coronary artery disease. This was confirmed with patient's daughter present in the emergency room -Continue Plavix cardiac meds -We'll cycle cardiac enzymes 3 -If troponin trends up consider Cardiologic consultation  3. Chronic dementia -Patient is a long-term resident at [facility]  4. Chronic atrial fibrillation -Not on any anticoagulation but on Plavix  5. History of chronic strokes -Continue Plavix  6. CODE STATUS DO NOT RESUSCITATE patient carries out of facility yellow DO NOT RESUSCITATE form  Above was discussed with patient's daughter was present emergency room.  All the records are reviewed and case discussed with ED provider. Management plans discussed with the patient, family and they are in agreement.  CODE STATUS: DNR   TOTAL TIME TAKING CARE OF THIS PATIENT: 45 minutes.    Ethyn Schetter M.D on 06/05/2016 at 10:25 AM  Between 7am to 6pm - Pager -  [phone]  After 6pm go to [email or web address] - password [email or web address] [facility]  [facility]  Office  [phone]  CC: Primary care physician; [name] Patient

## 2016-06-05 NOTE — Clinical Social Work Note (Signed)
Clinical Social Work Assessment  Patient Details  Name: [name] [name] MRN: [id number] Date of Birth: 27-Mar-1936  Date of referral:  06/05/16               Reason for consult:  Discharge Planning                Permission sought to share information with:  Facility Medical sales representative, Family Supports Permission granted to share information::     Name::      [name] [name] [phone] (daughter)  Agency::     Relationship::     Contact Information:     Housing/Transportation Living arrangements for the past 2 months:  Skilled Nursing Facility Nutritional therapist ) Source of Information:  Facility Patient Interpreter Needed:  None Criminal Activity/Legal Involvement Pertinent to Current Situation/Hospitalization:  No - Comment as needed Significant Relationships:  Adult Children, Merchandiser, retail Lives with:  Facility Resident Do you feel safe going back to the place where you live?  Yes Need for family participation in patient care:  Yes (Comment)  Care giving concerns: No care giving concerns addressed at this time.   Social Worker assessment / plan: CSW received consult for discharge planning. Assessment was completed with facility worker because pt is currently confused and difficult to understand. CSW introduced herself and her role as a Child psychotherapist. Facility worker states that at baseline pt is generally very confused, needs constant reminders, and is not able to walk on her own. Pt does not speak clearly, however, worker states that occasionally pt is able to get out a coherent sentence and also will spell words out at times to be understood. Pt has 2 daughters that are involved in her care. Facility is willing to accept pt back upon d/c. CSW informed facility that pt would likely be admitted and kept overnight. CSW will complete FL2 as [name] has stated they will need it. CSW will continue to follow pt and assist as needed.  Employment status:  Retired Health and safety inspector:   Medicare PT Recommendations:  Not assessed at this time Information / Referral to community resources:  Skilled Nursing Facility  Patient/Family's Response to care: Pt will return to facility upon d/c.  Patient/Family's Understanding of and Emotional Response to Diagnosis, Current Treatment, and Prognosis: Pt is appreciative of the assistance provided by CSW at this time.  Emotional Assessment Appearance:  Appears stated age Attitude/Demeanor/Rapport:  Unable to Assess Affect (typically observed):  Unable to Assess Orientation:  Oriented to Self, Fluctuating Orientation (Suspected and/or reported Sundowners) Alcohol / Substance use:  Not Applicable Psych involvement (Current and /or in the community):  No (Comment)  Discharge Needs  Concerns to be addressed:  Care Coordination Readmission within the last 30 days:  Yes Current discharge risk:  Dependent with Mobility Barriers to Discharge:  No Barriers Identified   Jonathon Jordan, LCSWA 06/05/2016, 1:36 PM

## 2016-06-05 NOTE — Care Management (Signed)
Patient presents from [facility] where she is under a long term care plan of care.  She sustained a fall at the nursing station at the facility.  She is placed in observation for continued observation of neuro status and trending of troponins.  Anticipate return to facility.  CSW involved

## 2016-06-05 NOTE — Progress Notes (Signed)
PT Cancellation Note  Patient Details Name: [name] [name] MRN: [id number] DOB: 15-Aug-1936   Cancelled Treatment:    Reason Eval/Treat Not Completed: Fatigue/lethargy limiting ability to participate. Patient is fast asleep, PT attempted to rouse however patient did not become more alert. Per RN, patient has been asleep most of the day and did not arouse even with MRSA swab. PT will re-attempt at a later date as patient is more able to participate in therapy.   Kerin Ransom, PT, DPT    06/05/2016, 3:22 PM

## 2016-06-05 NOTE — [place] FL2 (Signed)
[place] MEDICAID FL2 LEVEL OF CARE SCREENING TOOL     IDENTIFICATION  Patient Name: [name] [name] Birthdate: [birth year] Sex: female Admission Date (Current Location): 06/05/2016  [place] and [place] Number:  [address] [id number] A Facility and Address:  [facility], [address], [place], [place] [address]      Provider Number: [id number]  Attending Physician Name and Address:  Enedina Finner, MD  Relative Name and Phone Number:       Current Level of Care: Hospital Recommended Level of Care: Skilled Nursing Facility Prior Approval Number:    Date Approved/Denied:   PASRR Number:    Discharge Plan: SNF    Current Diagnoses: Patient Active Problem List   Diagnosis Date Noted  . Paroxysmal atrial fibrillation (HCC)   . Encounter for palliative care   . Goals of care, counseling/discussion   . Altered mental state   . Cerebrovascular accident (CVA) due to embolism of left middle cerebral artery (HCC)   . Dementia   . Facial asymmetry 02/01/2016  . Altered mental status 02/01/2016  . Closed head injury 05/30/2014  . Pulmonary hypertension, moderate to severe (HCC) 05/30/2014  . DM (diabetes mellitus), type 2 with neurological complications (HCC) 05/30/2014  . Hypoglycemia 05/29/2014  . Hypokalemia 05/29/2014  . Subdural hematoma caused by concussion (HCC) 05/28/2014  . hemorrhagic cerebral contusion 05/28/2014  . Dyslipidemia 03/07/2014  . Chronic respiratory failure (HCC) 01/01/2014  . Chest pain   . UTI (urinary tract infection) 11/19/2013  . Compression fracture of L4 lumbar vertebra (HCC) 11/18/2013  . Syncope 11/18/2013  . Protein-calorie malnutrition, severe (HCC) 11/18/2013  . Syncope and collapse 11/17/2013  . Closed compression fracture of L4 lumbar vertebra (HCC) 11/17/2013  . Gait difficulty 10/05/2013  . Fall (on)(from) incline, initial encounter 10/03/2013  . Essential hypertension, benign 10/03/2013  . Unsteady gait 10/03/2013   . Peripheral neuropathy (HCC) 10/03/2013  . Back pain 08/06/2013  . Near syncope 08/06/2013  . Pulmonary hypertension (HCC) 08/05/2013  . Abnormality of gait 05/24/2013  . Acute respiratory distress (HCC) 05/08/2013  . Pulmonary edema, acute (HCC) 05/08/2013  . Benign hypertensive heart disease without heart failure   . Pulmonary HTN (HCC)   . Oxygen dependent   . COPD (chronic obstructive pulmonary disease) (HCC)   . Fatigue due to cardiopulmonary and deconditioning 04/23/2013  . Memory deficit 02/07/2013  . Memory change 12/11/2012  . TIA (transient ischemic attack) 02/19/2012  . Chest pain at rest 08/05/2011  . Type II or unspecified type diabetes mellitus without mention of complication, uncontrolled 06/12/2011  . Fall 03/22/2011  . Mitral regurgitation 03/13/2011  . Hypertrophic cardiomyopathy (HCC)   . Osteoarthritis   . Situational mixed anxiety and depressive disorder   . Pacemaker 02/26/2011  . Atrial fibrillation (HCC) 01/28/2011  . CHRONIC OBSTRUCTIVE PULMONARY DISEASE, MODERATE 05/23/2010  . DEPRESSIVE DISORDER NOT ELSEWHERE CLASSIFIED 04/11/2010  . HYPERLIPIDEMIA 05/31/2009  . Essential hypertension 05/31/2009  . ALLERGIC RHINITIS 05/31/2009  . SHORTNESS OF BREATH 05/31/2009  . Cough 05/31/2009    Orientation RESPIRATION BLADDER Height & Weight     Self  Normal Incontinent Weight: 119 lb 1.6 oz (54 kg) Height:   (165.1 cm)  BEHAVIORAL SYMPTOMS/MOOD NEUROLOGICAL BOWEL NUTRITION STATUS      Incontinent Diet (Soft diet, thin fluid consistency)  AMBULATORY STATUS COMMUNICATION OF NEEDS Skin   Extensive Assist Verbally Normal  Personal Care Assistance Level of Assistance   Bathing,  Feeding, Dressing  Bathing Assistance:  Limited Assistance Feeding Assistance: Independent  Dressing Assistance: Limited Assistance          Functional Limitations Info  Sight, Hearing, Speech Sight Info: Adequate Hearing Info:  Adequate Speech Info: Impaired (Rarely able to be understood.)    SPECIAL CARE FACTORS FREQUENCY  PT (By licensed PT), OT (By licensed OT), Speech therapy     PT Frequency: 5 OT Frequency: 5     Speech Therapy Frequency: 5      Contractures      Additional Factors Info  Code Status, Allergies Code Status Info: DNR Allergies Info: Aspirin, Calan Verapamil Hcl, Crestor Rosuvastatin Calcium, Escitalopram Oxalate, Lipitor Atorvastatin Calcium, Lopressor Metoprolol Tartrate, Nitroglycerin, Norvasc Amlodipine Besylate, Nsaids, Oxycodone, Prednisone, Sulfa Antibiotics, Vimovo Naproxen-esomeprazole, Penicillins           Current Medications (06/05/2016):  This is the current hospital active medication list Current Facility-Administered Medications  Medication Dose Route Frequency Provider Last Rate Last Dose  . acetaminophen (TYLENOL) tablet 650 mg  650 mg Oral Q6H PRN Enedina Finner, MD       Or  . acetaminophen (TYLENOL) suppository 650 mg  650 mg Rectal Q6H PRN Enedina Finner, MD      . enoxaparin (LOVENOX) injection 40 mg  40 mg Subcutaneous Q24H Enedina Finner, MD      . ondansetron (ZOFRAN) tablet 4 mg  4 mg Oral Q6H PRN Enedina Finner, MD       Or  . ondansetron (ZOFRAN) injection 4 mg  4 mg Intravenous Q6H PRN Enedina Finner, MD      . oxyCODONE (Oxy IR/ROXICODONE) immediate release tablet 5 mg  5 mg Oral Q4H PRN Enedina Finner, MD      . sodium chloride flush (NS) 0.9 % injection 3 mL  3 mL Intravenous Q12H Enedina Finner, MD   3 mL at 06/05/16 1148     Discharge Medications: Please see discharge summary for a list of discharge medications.  Relevant Imaging Results:  Relevant Lab Results:   Additional Information SSN [id number]  Jonathon Jordan, LCSWA

## 2016-06-05 NOTE — Care Management Obs Status (Signed)
yesMEDICARE OBSERVATION STATUS NOTIFICATION   Patient Details  Name: [name] [name] MRN: [id number] Date of Birth: 1936/05/14   Medicare Observation Status Notification Given:    yes   Berna Bue, RN 06/05/2016, 10:53 AM

## 2016-06-05 NOTE — Progress Notes (Signed)
CSW contacted [facility] [phone]. CSW informed facility that pt would be admitted overnight for observation. Pt can return to facility upon d/c. CSW will keep facility updated on pt's condition. CSW will continue to follow pt and assist as needed.  Jonathon Jordan, MSW, [facility] [phone]

## 2016-06-06 LAB — URINE CULTURE

## 2016-06-06 LAB — TROPONIN I
Troponin I: 0.04 ng/mL (ref <0.03)
Troponin I: 0.04 ng/mL (ref <0.03)

## 2016-06-06 NOTE — Progress Notes (Signed)
Physical Therapy Evaluation Patient Details Name: [name] [name] MRN: [id number] DOB: 07-Sep-1936 Today's Date: 06/06/2016   History of Present Illness  Pt is a 80 y/o female admitted for a fall and elevated troponin. Pt has baseline dementia and expressive aphasia; per MD notes she fell at [facility] but cannot recall how. PMH includes A-fib, previous CVA, recurrent UTIs, COPD, and CHF.  Clinical Impression  Pt is a 80 y/o female who was admitted due to a fall. PLOF: Per previous pt notes, pt is dependent for all ADLs and mobility, living at [facility] prior to admission. Pt was lethargic upon PT arrival. PT attempted to arouse her in bed with verbal and tactile prompting but pt remained lethargic. PT +2 total assist to get pt sitting on EOB in attempt to arouse her. Pt woke up but become agitated and began swatting hands at PT saying "go away, let me go back to sleep." Unable to get history due to baseline cognitive status. Pt required mod assist to maintain sitting balance. Pt required mod assist for sit to supine. Pt will benefit from skilled PT in order to improve strength, balance, and functional mobility. Pt is appropriate to return to SNF at this time as they can address goals and provide 24 hour assistance. This entire session was guided, instructed, and directly supervised by Elizabeth Palau, DPT.    Follow Up Recommendations SNF;Supervision/Assistance - 24 hour    Equipment Recommendations       Recommendations for Other Services       Precautions / Restrictions Precautions Precautions: Fall Restrictions Weight Bearing Restrictions: No      Mobility  Bed Mobility Overal bed mobility: Needs Assistance Bed Mobility: Supine to Sit;Sit to Supine     Supine to sit: +2 for physical assistance;Total assist Sit to supine: Mod assist;+2 for physical assistance   General bed mobility comments: Pt was lethargic upon arrival, PT used +2 total assist to get her sitting on EOB to  arouse her. Pt did wake up, but becamse agitated and wanted to go back to sleep.  Transfers                 General transfer comment: Not attempted due to cognitive status  Ambulation/Gait             General Gait Details: Not attempted due to cognitive status  Stairs            Wheelchair Mobility    Modified Rankin (Stroke Patients Only)       Balance Overall balance assessment: Needs assistance Sitting-balance support: Bilateral upper extremity supported;Feet supported Sitting balance-Leahy Scale: Fair Sitting balance - Comments: Pt required PT assistance for sitting balance. Increased trunk flexion.                                     Pertinent Vitals/Pain      Home Living Family/patient expects to be discharged to:: Skilled nursing facility                 Additional Comments:  (Previously living at [facility])    Prior Function Level of Independence: Needs assistance         Comments: Per notes, pt's daughter stated that she needed help to transfer in/out of the w/c at baseline and has been uanble to walk for some time. Unable to obtain history from pt due to cognitive impairments  Hand Dominance        Extremity/Trunk Assessment   Upper Extremity Assessment: Difficult to assess due to impaired cognition (UE strength at least 3/5, able to lift against gravity)           Lower Extremity Assessment: Difficult to assess due to impaired cognition (LE strength at least 3/5, able to lift against gravity.)         Communication   Communication: Expressive difficulties;HOH  Cognition Arousal/Alertness: Lethargic Behavior During Therapy: Agitated Overall Cognitive Status: History of cognitive impairments - at baseline                      General Comments      Exercises        Assessment/Plan    PT Assessment Patient needs continued PT services  PT Diagnosis Generalized weakness   PT  Problem List Decreased strength;Decreased balance;Decreased mobility;Decreased safety awareness  PT Treatment Interventions Gait training;Functional mobility training;Therapeutic activities;Therapeutic exercise;Balance training;Patient/family education   PT Goals (Current goals can be found in the Care Plan section) Acute Rehab PT Goals Patient Stated Goal: PT unable to form goal due to cognitive status PT Goal Formulation: Patient unable to participate in goal setting Time For Goal Achievement: 06/20/16 Potential to Achieve Goals: Good    Frequency Min 2X/week   Barriers to discharge        Co-evaluation               End of Session   Activity Tolerance: Patient limited by lethargy;Treatment limited secondary to agitation Patient left: in bed;with call bell/phone within reach;with bed alarm set Nurse Communication: Mobility status    Functional Assessment Tool Used: clinical judgement Functional Limitation: Mobility: Walking and moving around Mobility: Walking and Moving Around Current Status (Z3086): At least 1 percent but less than 20 percent impaired, limited or restricted Mobility: Walking and Moving Around Goal Status [phone]): 0 percent impaired, limited or restricted    Time: 1000-1013 PT Time Calculation (min) (ACUTE ONLY): 13 min   Charges:   PT Evaluation $PT Eval Low Complexity: 1 Procedure     PT G Codes:   PT G-Codes **NOT FOR INPATIENT CLASS** Functional Assessment Tool Used: clinical judgement Functional Limitation: Mobility: Walking and moving around Mobility: Walking and Moving Around Current Status (N6295): At least 1 percent but less than 20 percent impaired, limited or restricted Mobility: Walking and Moving Around Goal Status [phone]): 0 percent impaired, limited or restricted    [facility] 06/06/2016, 11:06 AM Thereasa Parkin, SPT [phone]

## 2016-06-06 NOTE — Care Management (Signed)
Informed during progression that patient is followed by hospice at [facility].  Reached out to nurse liaison and confirmed patient is currently open to [facility].  She is for discharge today back to facility.

## 2016-06-06 NOTE — Discharge Summary (Signed)
[facility] - [place] at [facility]   PATIENT NAME: [name] [name]    MR#:  [id number]  DATE OF BIRTH:  08/22/1936  DATE OF ADMISSION:  06/05/2016 ADMITTING PHYSICIAN: Enedina Finner, MD  DATE OF DISCHARGE: 06/06/16  PRIMARY CARE PHYSICIAN: [name] Patient    ADMISSION DIAGNOSIS:  Lightheadedness [R42] Elevated troponin [R79.89] Fall, initial encounter [W19.XXXA]  DISCHARGE DIAGNOSIS:  Mechanical fall  Right scalp hematoma Chronic dementia  SECONDARY DIAGNOSIS:   Past Medical History:  Diagnosis Date  . Anemia    "as a child" (08/05/2013)  . Anxiety   . Arthritis    "all over me; in all my joints" (08/05/2013)  . Atrial fibrillation (HCC)    a. Chronic; No longer on coumadin 2/2 frequent falls.  . Chest pain    a. 08/2011 Myoview: sm, fixed anteroseptal defect (attenuation), no ischemia, EF 62%.  . CHF (congestive heart failure) (HCC)   . Coccyx pain   . COPD (chronic obstructive pulmonary disease) (HCC)   . Dementia   . Depression   . Falls frequently    coumadin stopped  . GERD (gastroesophageal reflux disease)   . Gout    "right foot" (08/05/2013)  . H/O hiatal hernia   . History of pneumonia    "couple times; long time ago" (08/05/2013)  . Hypercholesterolemia   . Hypertension   . Hypertrophic cardiomyopathy (HCC)    a. 11/2013 Echo: EF 55-60%, basal inf HK, sev dil LA, no evidence of HCM.  PASP .  [name] Hyponatremia   . Hyposmolality   . Osteoarthritis   . Oxygen dependent    "3L 24/7" (08/05/2013)  . Pacemaker    a. 04/2012 MDT [id number] [employer] PPM, ser #: [id number] H.  . Pulmonary HTN (HCC)    a. has had prior right heart cath in 2010; was felt that most likely due to elevated left sided pressures and would not benefit from vasodilator therapy;  b. 11/2013 Echo: PASP .  . Situational mixed anxiety and depressive disorder   . TIA (transient ischemic attack)   . Type II diabetes mellitus (HCC)   . Varicose veins     HOSPITAL  COURSE:   [name] [name]  is a 80 y.o. female with a known history of Chronic dementia and is a resident at [facility] comes to the emergency room after she had an unwitnessed fall near the nurses station at [place]. She complained of the right skull pain and was found to have right frontal hematoma. She has some expressive aphasia at baseline from prior stroke but is able to communicate well.  1.Mechanical fall with right skull hematoma. -Patient hemodynamically stable doing well. -CT head negative for new stroke. Patient has old chronic strokes. Right frontal hematoma. Monitor H&H.  -As needed pain meds -Physical therapy to see patient-pt not much understanding due to dementia  2. Elevated troponin appears to demand ischemia. -Patient does not have coronary artery disease. This was confirmed with patient's daughter present in the emergency room -Continue Plavix cardiac meds -CE trending down  3. Chronic dementia -Patient is a long-term resident at [facility]  4. Chronic atrial fibrillation -Not on any anticoagulation but on Plavix  5. History of chronic strokes -Continue Plavix  6. CODE STATUS DO NOT RESUSCITATE patient carries out of facility yellow DO NOT RESUSCITATE form  Will d/c back to NH today CONSULTS OBTAINED:  Treatment Team:  Shane Crutch, MD Jeralyn Ruths, MD  DRUG ALLERGIES:   Allergies  Allergen  Reactions  . Aspirin Nausea And Vomiting  . Calan [Verapamil Hcl] Other (See Comments)    Patient states it makes her "out of her mind"; bp bottoms out (takes diltiazem at home)  . Crestor [Rosuvastatin Calcium] Other (See Comments)    Muscle pain  . Escitalopram Oxalate Other (See Comments)    Reaction Unknown   . Lipitor [Atorvastatin Calcium] Other (See Comments)    myalgia  . Lopressor [Metoprolol Tartrate] Other (See Comments)    Blood pressure bottoms out   . Nitroglycerin Other (See Comments)    Reaction unknown  . Norvasc [Amlodipine  Besylate] Other (See Comments)    fatigue  . Nsaids Nausea Only  . Oxycodone Other (See Comments)    Reaction unknown  . Prednisone Swelling  . Sulfa Antibiotics Nausea Only and Other (See Comments)    Makes her stomach hurt  . Vimovo [Naproxen-Esomeprazole] Nausea Only  . Penicillins Hives and Rash    DISCHARGE MEDICATIONS:   Current Discharge Medication List    CONTINUE these medications which have NOT CHANGED   Details  acetaminophen (TYLENOL) 325 MG tablet Take 650 mg by mouth 4 (four) times daily.     ALPRAZolam (XANAX) 0.25 MG tablet Take 0.25 mg by mouth 2 (two) times daily.    clopidogrel (PLAVIX) 75 MG tablet Take 1 tablet (75 mg total) by mouth daily. Qty: 30 tablet, Refills: 1    diltiazem (TIAZAC) 180 MG 24 hr capsule Take 180 mg by mouth daily.    divalproex (DEPAKOTE SPRINKLE) 125 MG capsule Take 250 mg by mouth 2 (two) times daily.    haloperidol (HALDOL) 0.5 MG tablet Take 0.5 mg by mouth every 8 (eight) hours as needed for agitation.    mirtazapine (REMERON SOL-TAB) 15 MG disintegrating tablet Take 15 mg by mouth at bedtime.    oxybutynin (DITROPAN XL) 15 MG 24 hr tablet Take 15 mg by mouth every morning.     senna (SENOKOT) 8.6 MG TABS tablet Take 1 tablet by mouth 2 (two) times daily.    benzocaine-menthol (CHLORASEPTIC) 6-10 MG lozenge Take 1 lozenge by mouth every 2 (two) hours as needed for sore throat.     Dextromethorphan-Guaifenesin (DIABETIC TUSSIN MAX ST) 10-200 MG/5ML LIQD Take 10 mLs by mouth every 6 (six) hours as needed (for cough).    morphine (ROXANOL) 20 MG/ML concentrated solution Take 0.5 mLs (10 mg total) by mouth every 4 (four) hours as needed for severe pain. Qty: 30 mL, Refills: 0        If you experience worsening of your admission symptoms, develop shortness of breath, life threatening emergency, suicidal or homicidal thoughts you must seek medical attention immediately by calling 911 or calling your MD immediately  if symptoms  less severe.  You Must read complete instructions/literature along with all the possible adverse reactions/side effects for all the Medicines you take and that have been prescribed to you. Take any new Medicines after you have completely understood and accept all the possible adverse reactions/side effects.   Please note  You were cared for by a hospitalist during your hospital stay. If you have any questions about your discharge medications or the care you received while you were in the hospital after you are discharged, you can call the unit and asked to speak with the hospitalist on call if the hospitalist that took care of you is not available. Once you are discharged, your primary care physician will handle any further medical issues. Please note that NO  REFILLS for any discharge medications will be authorized once you are discharged, as it is imperative that you return to your primary care physician (or establish a relationship with a primary care physician if you do not have one) for your aftercare needs so that they can reassess your need for medications and monitor your lab values. Today   SUBJECTIVE   Sleepy. She is arousable but gets upset when tried to wake her up VITAL SIGNS:  Blood pressure (!) 156/68, pulse (!) 57, temperature 98.3 F (36.8 C), resp. rate 18, height 5\' 5"  (1.651 m), weight 54 kg (119 lb 1.6 oz), SpO2 100 %.  I/O:   Intake/Output Summary (Last 24 hours) at 06/06/16 1119 Last data filed at 06/06/16 0807  Gross per 24 hour  Intake              363 ml  Output                0 ml  Net              363 ml    PHYSICAL EXAMINATION:  GENERAL:  80 y.o.-year-old patient lying in the bed with no acute distress.  EYES: Pupils equal, round, reactive to light and accommodation. No scleral icterus. Extraocular muscles intact.  HEENT: Head atraumatic, normocephalic. Oropharynx and nasopharynx clear.  NECK:  Supple, no jugular venous distention. No thyroid enlargement, no  tenderness.  LUNGS: Normal breath sounds bilaterally, no wheezing, rales,rhonchi or crepitation. No use of accessory muscles of respiration.  CARDIOVASCULAR: S1, S2 normal. No murmurs, rubs, or gallops.  ABDOMEN: Soft, non-tender, non-distended. Bowel sounds present. No organomegaly or mass.  EXTREMITIES: No pedal edema, cyanosis, or clubbing.  NEUROLOGIC: moves all extremities well. Unable to assess due to dementia PSYCHIATRIC: confused due to dementia SKIN: No obvious rash, lesion, or ulcer.   DATA REVIEW:   CBC   Recent Labs Lab 06/05/16 0726  WBC 5.4  HGB 13.7  HCT 39.9  PLT 201    Chemistries   Recent Labs Lab 06/05/16 0726  NA 140  K 3.5  CL 102  CO2 30  GLUCOSE 125*  BUN 26*  CREATININE 0.80  CALCIUM 9.7    Microbiology Results   Recent Results (from the past 240 hour(s))  MRSA PCR Screening     Status: None   Collection Time: 06/05/16 12:00 PM  Result Value Ref Range Status   MRSA by PCR NEGATIVE NEGATIVE Final    Comment:        The GeneXpert MRSA Assay (FDA approved for NASAL specimens only), is one component of a comprehensive MRSA colonization surveillance program. It is not intended to diagnose MRSA infection nor to guide or monitor treatment for MRSA infections.     RADIOLOGY:  Dg Chest 2 View  Result Date: 06/05/2016 CLINICAL DATA:  Pain following fall EXAM: CHEST  2 VIEW COMPARISON:  October 04, 2015 and March 06, 2015 FINDINGS: There is no edema or consolidation. There is cardiomegaly with pulmonary vascularity within normal limits. Pacemaker leads are attached to the right atrium and middle cardiac vein, stable. No pneumothorax. No adenopathy. There is anterior wedging of a mid thoracic vertebral body, stable. There is atherosclerotic calcification in the aortic arch region. IMPRESSION: No edema or consolidation. Stable cardiomegaly. Aortic atherosclerosis. Electronically Signed   By: Bretta Bang III M.D.   On: 06/05/2016  08:43  Ct Head Wo Contrast  Result Date: 06/05/2016 CLINICAL DATA:  CT cervical spine: EXAM: CT HEAD WITHOUT  CONTRAST CT CERVICAL SPINE WITHOUT CONTRAST TECHNIQUE: Multidetector CT imaging of the head and cervical spine was performed following the standard protocol without intravenous contrast. Multiplanar CT image reconstructions of the cervical spine were also generated. COMPARISON:  CT cervical spine October 03, 2015; head CT May 16, 2016 FINDINGS: CT HEAD FINDINGS There is moderate diffuse atrophy. There is no intracranial mass, hemorrhage, extra-axial fluid collection, or midline shift. There is evidence of a prior infarct at the left temporal - occipital junction, stable. There is evidence of a prior lacunar type infarct in the inferior right cerebellum, stable. There is small vessel disease throughout the centra semiovale bilaterally, stable. No acute infarct evident. There is a right frontal scalp hematoma. The bony calvarium appears intact without fracture. The mastoid air cells are clear. Orbits appear symmetric bilaterally. There is mild mucosal thickening in several ethmoid air cells bilaterally. Visualized paranasal sinuses elsewhere clear. There are foci of calcification in both distal vertebral arteries as well as in the carotid siphon regions bilaterally. There are no hyperdense vessels. CT CERVICAL SPINE FINDINGS There is no demonstrable fracture or spondylolisthesis. Prevertebral soft tissues and predental space regions are normal. There is a Schmorl's node along the superior endplate of T1, stable. There is moderately severe disc space narrowing at C5-6 and C6-7. There is slightly milder disc space narrowing at C7-T1 and T1-2. There is multilevel facet hypertrophy. There is moderate exit foraminal narrowing due to bony hypertrophy at C3-4 on the left, at C5-6 bilaterally, and at C6-7 bilaterally. There are foci of carotid artery calcification bilaterally. IMPRESSION: CT head: Moderate atrophy  with extensive periventricular small vessel disease. Prior infarct left temporal -occipital junction. Prior small infarct inferior right cerebellum. No acute infarct evident. Multiple foci of arterial vascular calcification noted. There is a right frontal scalp hematoma. No fracture evident. No intracranial mass, hemorrhage, or extra-axial fluid collection. CT cervical spine: No acute fracture or spondylolisthesis. Multilevel arthropathy. There is bilateral carotid artery calcification. Electronically Signed   By: Bretta Bang III M.D.   On: 06/05/2016 08:24  Ct Cervical Spine Wo Contrast  Result Date: 06/05/2016 CLINICAL DATA:  CT cervical spine: EXAM: CT HEAD WITHOUT CONTRAST CT CERVICAL SPINE WITHOUT CONTRAST TECHNIQUE: Multidetector CT imaging of the head and cervical spine was performed following the standard protocol without intravenous contrast. Multiplanar CT image reconstructions of the cervical spine were also generated. COMPARISON:  CT cervical spine October 03, 2015; head CT May 16, 2016 FINDINGS: CT HEAD FINDINGS There is moderate diffuse atrophy. There is no intracranial mass, hemorrhage, extra-axial fluid collection, or midline shift. There is evidence of a prior infarct at the left temporal - occipital junction, stable. There is evidence of a prior lacunar type infarct in the inferior right cerebellum, stable. There is small vessel disease throughout the centra semiovale bilaterally, stable. No acute infarct evident. There is a right frontal scalp hematoma. The bony calvarium appears intact without fracture. The mastoid air cells are clear. Orbits appear symmetric bilaterally. There is mild mucosal thickening in several ethmoid air cells bilaterally. Visualized paranasal sinuses elsewhere clear. There are foci of calcification in both distal vertebral arteries as well as in the carotid siphon regions bilaterally. There are no hyperdense vessels. CT CERVICAL SPINE FINDINGS There is no  demonstrable fracture or spondylolisthesis. Prevertebral soft tissues and predental space regions are normal. There is a Schmorl's node along the superior endplate of T1, stable. There is moderately severe disc space narrowing at C5-6 and C6-7. There is slightly milder disc  space narrowing at C7-T1 and T1-2. There is multilevel facet hypertrophy. There is moderate exit foraminal narrowing due to bony hypertrophy at C3-4 on the left, at C5-6 bilaterally, and at C6-7 bilaterally. There are foci of carotid artery calcification bilaterally. IMPRESSION: CT head: Moderate atrophy with extensive periventricular small vessel disease. Prior infarct left temporal -occipital junction. Prior small infarct inferior right cerebellum. No acute infarct evident. Multiple foci of arterial vascular calcification noted. There is a right frontal scalp hematoma. No fracture evident. No intracranial mass, hemorrhage, or extra-axial fluid collection. CT cervical spine: No acute fracture or spondylolisthesis. Multilevel arthropathy. There is bilateral carotid artery calcification. Electronically Signed   By: Bretta Bang III M.D.   On: 06/05/2016 08:24    Management plans discussed with the patient, family and they are in agreement.  CODE STATUS:     Code Status Orders        Start     Ordered   06/05/16 1109  Do not attempt resuscitation (DNR)  Continuous    Question Answer Comment  In the event of cardiac or respiratory ARREST Do not call a "code blue"   In the event of cardiac or respiratory ARREST Do not perform Intubation, CPR, defibrillation or ACLS   In the event of cardiac or respiratory ARREST Use medication by any route, position, wound care, and other measures to relive pain and suffering. May use oxygen, suction and manual treatment of airway obstruction as needed for comfort.      06/05/16 1109    Code Status History    Date Active Date Inactive Code Status Order ID Comments User Context   02/01/2016  11:50 PM 02/06/2016  9:29 PM DNR [id number]  Hillary Bow, DO ED   05/29/2014  1:14 AM 05/31/2014  6:27 PM Partial Code [id number]  Therisa Doyne, MD Inpatient   11/18/2013 12:38 AM 11/20/2013  4:43 PM Full Code [id number]  Hillary Bow, DO ED   08/05/2013  5:57 PM 08/10/2013  5:47 PM Full Code [id number]  Drema Dallas, MD Inpatient      TOTAL TIME TAKING CARE OF THIS PATIENT: 40 minutes.    Ontario Pettengill M.D on 06/06/2016 at 11:19 AM  Between 7am to 6pm - Pager - [phone] After 6pm go to [email or web address] - password [email or web address] [facility]  [place] [facility]  Office  [phone]  CC: Primary care physician; [name] Patient

## 2016-06-06 NOTE — Progress Notes (Signed)
Clinical Social Worker was informed that patient will be medically ready to discharge to [name]. Patient's family are in a agreement with plan. CSW called Morrie Sheldon- Admissions at [name] to confirm that patient's bed is ready. Provided patient's room number 323 and number to call for report [phone] . All discharge information faxed to [name] via [employer]. Call to patient's daughterRoanna Raider, to inform her patient would discharge to [name]. Requested patient be transported via EMS due to family being out of town. RN will call report and patient will discharge to [name] via EMS.  Woodroe Mode, MSW, LCSW, LCAS-A Clinical Social Worker [phone]

## 2016-06-06 NOTE — Progress Notes (Signed)
Patient accidentally removed PIV.  Possible D/C today.  Will speak to doctor about whether or not another IV is needed until discharge.

## 2016-06-06 NOTE — Progress Notes (Signed)
Patient is alert to self.  Patient has a small headache, given tylenol.  VSS.  Called report to [facility].  Family notified.  Patient to be escorted out of hospital via wheelchair by EMS

## 2016-06-06 NOTE — Plan of Care (Signed)
Problem: Education: Goal: Knowledge of  General Education information/materials will improve Outcome: Not Met (add Reason) Patient confused.  Unable to educate   Problem: Safety: Goal: Ability to remain free from injury will improve Outcome: Not Met (add Reason) Patient confused.  Unable to educate

## 2016-06-06 NOTE — Progress Notes (Addendum)
Follow up on current hospice patient that I was just notified by Ermalene Searing [facility] that she had been admitted to the hospital observation on 7.26.17 for complaint of fall with right frontal hematoma.  CT scan was negative for new CVA or injury.  She was admitted to observe her further.   In the ED initial troponin level was elevated but subsequent levels trended down.   She will be discharging home today back to [facility].Updated hospital records and report given to hospice staff and faxed to triage.

## 2016-06-09 NOTE — Progress Notes (Signed)
Location:  [facility] Service:  SNF [phone]) Provider:  [name], NP-C  [name] Patient  Patient Care Team: [name] Patient as PCP - General (General Practice)  Extended Emergency Contact Information Primary Emergency Contact: [name],[name]          Bruna Potter, [place] Mobile Phone: [phone] Relation: Daughter Secondary Emergency Contact: [name] Address: [address]           [place], [place] [address] [place] Home Phone: [phone] Mobile Phone: [phone] Relation: Daughter  Code Status:  DNR Goals of care: Advanced Directive information Advanced Directives 06/05/2016  Does patient have an advance directive? Yes  Type of Advance Directive -  Does patient want to make changes to advanced directive? -  Copy of advanced directive(s) in chart? -  Would patient like information on creating an advanced directive? -  Pre-existing out of facility DNR order (yellow form or pink MOST form) -     Chief Complaint  Patient presents with  . Medical Management of Chronic Issues    HPI:  Pt is a 80 y.o. female seen today for medical management of chronic diseases. She has a history of COPD with Oxygen dependence. She is also under the services of Hospice. Her breathing has been doing well as of late, without exacerbation. Nursing reports no cough, congestion, wheezing. She does continue to get up unassisted frequently.     Past Medical History:  Diagnosis Date  . Anemia    "as a child" (08/05/2013)  . Anxiety   . Arthritis    "all over me; in all my joints" (08/05/2013)  . Atrial fibrillation (HCC)    a. Chronic; No longer on coumadin 2/2 frequent falls.  . Chest pain    a. 08/2011 Myoview: sm, fixed anteroseptal defect (attenuation), no ischemia, EF 62%.  . CHF (congestive heart failure) (HCC)   . Coccyx pain   . COPD (chronic obstructive pulmonary disease) (HCC)   . Dementia   . Depression   .  Falls frequently    coumadin stopped  . GERD (gastroesophageal reflux disease)   . Gout    "right foot" (08/05/2013)  . H/O hiatal hernia   . History of pneumonia    "couple times; long time ago" (08/05/2013)  . Hypercholesterolemia   . Hypertension   . Hypertrophic cardiomyopathy (HCC)    a. 11/2013 Echo: EF 55-60%, basal inf HK, sev dil LA, no evidence of HCM.  PASP .  [name] Hyponatremia   . Hyposmolality   . Osteoarthritis   . Oxygen dependent    "3L 24/7" (08/05/2013)  . Pacemaker    a. 04/2012 MDT [id number] [employer] PPM, ser #: [id number].  . Pulmonary HTN (HCC)    a. has had prior right heart cath in 2010; was felt that most likely due to elevated left sided pressures and would not benefit from vasodilator therapy;  b. 11/2013 Echo: PASP .  . Situational mixed anxiety and depressive disorder   . TIA (transient ischemic attack)   . Type II diabetes mellitus (HCC)   . Varicose veins    Past Surgical History:  Procedure Laterality Date  . ABDOMINAL HYSTERECTOMY     "partial" (08/05/2013)  . APPENDECTOMY    . BACK SURGERY     "bone spur removed; mid back" (08/05/2013)  . CARDIAC CATHETERIZATION     "more than once" (08/05/2013)  . DILATION AND CURETTAGE OF UTERUS     "  several" (08/05/2013)  . FOOT NEUROMA SURGERY Right   . INSERT / REPLACE / REMOVE PACEMAKER  1998; 2000's; 2014  . PACEMAKER GENERATOR CHANGE N/A 04/24/2012   Procedure: PACEMAKER GENERATOR CHANGE;  Surgeon: Duke Salvia, MD;  Location: [facility] CATH LAB;  Service: Cardiovascular;  Laterality: N/A;  . TONSILLECTOMY    . TOTAL KNEE ARTHROPLASTY Right 2011    Allergies  Allergen Reactions  . Aspirin Nausea And Vomiting  . Calan [Verapamil Hcl] Other (See Comments)    Patient states it makes her "out of her mind"; bp bottoms out (takes diltiazem at home)  . Crestor [Rosuvastatin Calcium] Other (See Comments)    Muscle pain  . Escitalopram Oxalate Other (See Comments)    Reaction Unknown   . Lipitor  [Atorvastatin Calcium] Other (See Comments)    myalgia  . Lopressor [Metoprolol Tartrate] Other (See Comments)    Blood pressure bottoms out   . Nitroglycerin Other (See Comments)    Reaction unknown  . Norvasc [Amlodipine Besylate] Other (See Comments)    fatigue  . Nsaids Nausea Only  . Oxycodone Other (See Comments)    Reaction unknown  . Prednisone Swelling  . Sulfa Antibiotics Nausea Only and Other (See Comments)    Makes her stomach hurt  . Vimovo [Naproxen-Esomeprazole] Nausea Only  . Penicillins Hives and Rash      Medication List       Accurate as of 06/03/16 11:59 PM. Always use your most recent med list.          acetaminophen 325 MG tablet Commonly known as:  TYLENOL Take 650 mg by mouth 4 (four) times daily.   CHLORASEPTIC 6-10 MG lozenge Generic drug:  benzocaine-menthol Take 1 lozenge by mouth every 2 (two) hours as needed for sore throat.   clopidogrel 75 MG tablet Commonly known as:  PLAVIX Take 1 tablet (75 mg total) by mouth daily.   DIABETIC TUSSIN MAX ST 10-200 MG/5ML Liqd Generic drug:  Dextromethorphan-Guaifenesin Take 10 mLs by mouth every 6 (six) hours as needed (for cough).   divalproex 125 MG capsule Commonly known as:  DEPAKOTE SPRINKLE Take 250 mg by mouth 2 (two) times daily.   mirtazapine 15 MG disintegrating tablet Commonly known as:  REMERON SOL-TAB Take 15 mg by mouth at bedtime.   morphine 20 MG/ML concentrated solution Commonly known as:  ROXANOL Take 0.5 mLs (10 mg total) by mouth every 4 (four) hours as needed for severe pain.   oxybutynin 15 MG 24 hr tablet Commonly known as:  DITROPAN XL Take 15 mg by mouth every morning.   senna 8.6 MG Tabs tablet Commonly known as:  SENOKOT Take 1 tablet by mouth 2 (two) times daily.       Review of Systems  Unable to perform ROS: Dementia  Constitutional: Negative for activity change, appetite change and fever.  HENT: Negative for congestion, trouble swallowing and voice  change.   Respiratory: Negative for apnea, cough, choking, shortness of breath and wheezing.   Cardiovascular: Negative for chest pain, palpitations and leg swelling.  Gastrointestinal: Negative for abdominal distention and abdominal pain.  Musculoskeletal: Negative for back pain, gait problem and myalgias. Arthralgias: typical arthritis.  Skin: Negative for color change, pallor, rash and wound.  Psychiatric/Behavioral: Negative for agitation and behavioral problems.  All other systems reviewed and are negative.   Immunization History  Administered Date(s) Administered  . Influenza Split 09/11/2012  . Influenza Whole 10/15/2010  . Influenza-Unspecified 08/10/2013  . PPD Test 08/24/2013  .  Pneumococcal Polysaccharide-23 10/15/2010  . Pneumococcal-Unspecified 08/10/2013  . Tdap 10/01/2013   Pertinent  Health Maintenance Due  Topic Date Due  . FOOT EXAM  06/18/1946  . OPHTHALMOLOGY EXAM  06/18/1946  . URINE MICROALBUMIN  06/18/1946  . PNA vac Low Risk Adult (2 of 2 - PCV13) 08/10/2014  . INFLUENZA VACCINE  06/11/2016  . HEMOGLOBIN A1C  08/04/2016  . DEXA SCAN  Completed   Fall Risk  05/18/2013  Falls in the past year? Yes  Number falls in past yr: 1  Injury with Fall? No  Risk for fall due to : Impaired balance/gait;Impaired mobility   Functional Status Survey:    Vitals:   05/30/16 0500  BP: (!) 143/65  Pulse: 65  Resp: 16  Temp: (!) 96.2 F (35.7 C)  SpO2: 100%   There is no height or weight on file to calculate BMI. Physical Exam  Constitutional: She is oriented to person, place, and time. Vital signs are normal. She appears well-developed and well-nourished. She is active and cooperative. She does not appear ill. No distress.  HENT:  Head: Normocephalic and atraumatic.  Mouth/Throat: Uvula is midline, oropharynx is clear and moist and mucous membranes are normal. Mucous membranes are not pale, not dry and not cyanotic.  Eyes: EOM and lids are normal.  Neck:  Trachea normal, normal range of motion and full passive range of motion without pain. No JVD present. No tracheal deviation, no edema and no erythema present.  Cardiovascular: Normal rate, regular rhythm, intact distal pulses and normal pulses.  Exam reveals no gallop, no distant heart sounds and no friction rub.   No murmur heard. Pulmonary/Chest: Effort normal and breath sounds normal. No accessory muscle usage. No respiratory distress. She has no wheezes. She has no rales. She exhibits no tenderness.  Abdominal: Normal appearance and bowel sounds are normal. She exhibits no distension and no ascites. There is no tenderness.  Musculoskeletal: She exhibits no edema.  Expected osteoarthritis, stiffness  Neurological: She is alert and oriented to person, place, and time. She has normal strength.  Skin: Skin is warm, dry and intact. No rash noted. She is not diaphoretic. No cyanosis or erythema. No pallor. Nails show no clubbing.  Psychiatric: She has a normal mood and affect. Her speech is normal and behavior is normal. Judgment and thought content normal. Cognition and memory are normal.  Nursing note and vitals reviewed.   Labs reviewed:  Recent Labs  02/04/16 0525 04/28/16 0450 06/05/16 0726  NA 138 138 140  K 3.8 4.4 3.5  CL 105 105 102  CO2 GLUCOSE 120* 85 125*  BUN 14 33* 26*  CREATININE 0.75 0.79 0.80  CALCIUM 9.6 9.3 9.7    Recent Labs  02/01/16 2126 02/04/16 0525 04/28/16 0450  AST ALT 11* 12* 8*  ALKPHOS 60 65 57  BILITOT 0.6 1.2 0.5  PROT 6.5 6.9 6.7  ALBUMIN 3.6 3.5 3.9    Recent Labs  02/01/16 2126  02/04/16 0525 04/28/16 0450 06/05/16 0726  WBC 5.9  --  5.6 5.7 5.4  NEUTROABS 3.6  --   --  3.2 2.6  HGB 12.2  < > 13.7 11.9* 13.7  HCT 37.5  < > 40.6 35.5 39.9  MCV 99.7  --  96.9 99.6 98.6  PLT 182  --  186 163 201  < > = values in this interval not displayed. Lab Results  Component Value Date   TSH 1.674  02/07/2015   Lab  Results  Component Value Date   HGBA1C 5.4 02/02/2016   Lab Results  Component Value Date   CHOL 254 (H) 02/04/2016   HDL 45 02/04/2016   LDLCALC 172 (H) 02/04/2016   TRIG 187 (H) 02/04/2016   CHOLHDL 5.6 02/04/2016    Significant Diagnostic Results in last 30 days:  Dg Chest 2 View  Result Date: 06/05/2016 CLINICAL DATA:  Pain following fall EXAM: CHEST  2 VIEW COMPARISON:  October 04, 2015 and March 06, 2015 FINDINGS: There is no edema or consolidation. There is cardiomegaly with pulmonary vascularity within normal limits. Pacemaker leads are attached to the right atrium and middle cardiac vein, stable. No pneumothorax. No adenopathy. There is anterior wedging of a mid thoracic vertebral body, stable. There is atherosclerotic calcification in the aortic arch region. IMPRESSION: No edema or consolidation. Stable cardiomegaly. Aortic atherosclerosis. Electronically Signed   By: Bretta Bang III M.D.   On: 06/05/2016 08:43  Ct Head Wo Contrast  Result Date: 06/05/2016 CLINICAL DATA:  CT cervical spine: EXAM: CT HEAD WITHOUT CONTRAST CT CERVICAL SPINE WITHOUT CONTRAST TECHNIQUE: Multidetector CT imaging of the head and cervical spine was performed following the standard protocol without intravenous contrast. Multiplanar CT image reconstructions of the cervical spine were also generated. COMPARISON:  CT cervical spine October 03, 2015; head CT May 16, 2016 FINDINGS: CT HEAD FINDINGS There is moderate diffuse atrophy. There is no intracranial mass, hemorrhage, extra-axial fluid collection, or midline shift. There is evidence of a prior infarct at the left temporal - occipital junction, stable. There is evidence of a prior lacunar type infarct in the inferior right cerebellum, stable. There is small vessel disease throughout the centra semiovale bilaterally, stable. No acute infarct evident. There is a right frontal scalp hematoma. The bony calvarium appears intact without fracture. The mastoid  air cells are clear. Orbits appear symmetric bilaterally. There is mild mucosal thickening in several ethmoid air cells bilaterally. Visualized paranasal sinuses elsewhere clear. There are foci of calcification in both distal vertebral arteries as well as in the carotid siphon regions bilaterally. There are no hyperdense vessels. CT CERVICAL SPINE FINDINGS There is no demonstrable fracture or spondylolisthesis. Prevertebral soft tissues and predental space regions are normal. There is a Schmorl's node along the superior endplate of T1, stable. There is moderately severe disc space narrowing at C5-6 and C6-7. There is slightly milder disc space narrowing at C7-T1 and T1-2. There is multilevel facet hypertrophy. There is moderate exit foraminal narrowing due to bony hypertrophy at C3-4 on the left, at C5-6 bilaterally, and at C6-7 bilaterally. There are foci of carotid artery calcification bilaterally. IMPRESSION: CT head: Moderate atrophy with extensive periventricular small vessel disease. Prior infarct left temporal -occipital junction. Prior small infarct inferior right cerebellum. No acute infarct evident. Multiple foci of arterial vascular calcification noted. There is a right frontal scalp hematoma. No fracture evident. No intracranial mass, hemorrhage, or extra-axial fluid collection. CT cervical spine: No acute fracture or spondylolisthesis. Multilevel arthropathy. There is bilateral carotid artery calcification. Electronically Signed   By: Bretta Bang III M.D.   On: 06/05/2016 08:24  Ct Head Wo Contrast  Result Date: 05/16/2016 CLINICAL DATA:  Initial evaluation for acute trauma, fall. EXAM: CT HEAD WITHOUT CONTRAST TECHNIQUE: Contiguous axial images were obtained from the base of the skull through the vertex without intravenous contrast. COMPARISON:  Prior CT from 02/03/2016. FINDINGS: Small scalp contusion at the right forehead. Scalp soft tissues otherwise unremarkable. No acute  abnormality about  the globes and orbits. Mild opacity within the right frontoethmoidal recess. Paranasal sinuses are otherwise clear. Trace opacity left mastoid air cells. Right mastoid air cells clear. Calvarium intact. Generalized cerebral atrophy with chronic small vessel ischemic disease. Previously identified subacute posterior left MCA infarct has evolved, now appears chronic in nature with associated encephalomalacia. Small remote right cerebellar infarct also noted. Intracranial atherosclerosis noted. No acute intracranial hemorrhage. No acute large vessel territory infarct. No mass lesion, midline shift, or mass effect. Ventricular prominence related to global parenchymal volume loss without hydrocephalus. No extra-axial fluid collection. IMPRESSION: 1. No acute intracranial process. 2. Small right forehead scalp contusion. 3. Remote left MCA and right cerebellar infarcts. 4. Atrophy with chronic small vessel ischemic disease. Electronically Signed   By: Rise Mu M.D.   On: 05/16/2016 03:28   Ct Cervical Spine Wo Contrast  Result Date: 06/05/2016 CLINICAL DATA:  CT cervical spine: EXAM: CT HEAD WITHOUT CONTRAST CT CERVICAL SPINE WITHOUT CONTRAST TECHNIQUE: Multidetector CT imaging of the head and cervical spine was performed following the standard protocol without intravenous contrast. Multiplanar CT image reconstructions of the cervical spine were also generated. COMPARISON:  CT cervical spine October 03, 2015; head CT May 16, 2016 FINDINGS: CT HEAD FINDINGS There is moderate diffuse atrophy. There is no intracranial mass, hemorrhage, extra-axial fluid collection, or midline shift. There is evidence of a prior infarct at the left temporal - occipital junction, stable. There is evidence of a prior lacunar type infarct in the inferior right cerebellum, stable. There is small vessel disease throughout the centra semiovale bilaterally, stable. No acute infarct evident. There is a right frontal scalp hematoma.  The bony calvarium appears intact without fracture. The mastoid air cells are clear. Orbits appear symmetric bilaterally. There is mild mucosal thickening in several ethmoid air cells bilaterally. Visualized paranasal sinuses elsewhere clear. There are foci of calcification in both distal vertebral arteries as well as in the carotid siphon regions bilaterally. There are no hyperdense vessels. CT CERVICAL SPINE FINDINGS There is no demonstrable fracture or spondylolisthesis. Prevertebral soft tissues and predental space regions are normal. There is a Schmorl's node along the superior endplate of T1, stable. There is moderately severe disc space narrowing at C5-6 and C6-7. There is slightly milder disc space narrowing at C7-T1 and T1-2. There is multilevel facet hypertrophy. There is moderate exit foraminal narrowing due to bony hypertrophy at C3-4 on the left, at C5-6 bilaterally, and at C6-7 bilaterally. There are foci of carotid artery calcification bilaterally. IMPRESSION: CT head: Moderate atrophy with extensive periventricular small vessel disease. Prior infarct left temporal -occipital junction. Prior small infarct inferior right cerebellum. No acute infarct evident. Multiple foci of arterial vascular calcification noted. There is a right frontal scalp hematoma. No fracture evident. No intracranial mass, hemorrhage, or extra-axial fluid collection. CT cervical spine: No acute fracture or spondylolisthesis. Multilevel arthropathy. There is bilateral carotid artery calcification. Electronically Signed   By: Bretta Bang III M.D.   On: 06/05/2016 08:24   Assessment/Plan 1. Chronic obstructive pulmonary disease, unspecified COPD type (HCC) Continue oxygen as listed below. Pt is under the care of Hospice services. Continue Hospice. COPD well controlled without recent exacerbations.   2. Oxygen dependent Continue oxygen at 2 L/min via [place] Tit[place] oxygen for comfort  Family/ staff Communication:    Total Time: 20 minutes  Documentation: 10 minutes  Face to Face: 10 minutes  Family/Phone:   Labs/tests ordered:  none  Medications reviewed and assessed for continued  appropriateness. Monthly medication orders signed.   Brynda Rim, NP-C Geriatrics [facility] [phone] [address], [place] [address] Cell Phone (Mon-Fri 8am-5pm):  [phone] On Call:  [phone] & follow prompts after 5pm & weekends Office Phone:  [phone] Office Fax:  [phone]

## 2016-06-11 ENCOUNTER — Encounter
Admission: RE | Admit: 2016-06-11 | Discharge: 2016-06-11 | Disposition: A | Discharge status: Home / Self Care | Payer: Medicare Other | Source: Ambulatory Visit | Attending: Internal Medicine | Admitting: Internal Medicine

## 2016-06-11 LAB — GLUCOSE, CAPILLARY
GLUCOSE-CAPILLARY: 255 mg/dL — AB (ref 65–99)
Glucose-Capillary: 153 mg/dL — ABNORMAL HIGH (ref 65–99)

## 2016-06-12 LAB — GLUCOSE, CAPILLARY
GLUCOSE-CAPILLARY: 202 mg/dL — AB (ref 65–99)
Glucose-Capillary: 105 mg/dL — ABNORMAL HIGH (ref 65–99)
Glucose-Capillary: 123 mg/dL — ABNORMAL HIGH (ref 65–99)
Glucose-Capillary: 117 mg/dL — ABNORMAL HIGH (ref 65–99)

## 2016-06-13 LAB — GLUCOSE, CAPILLARY
GLUCOSE-CAPILLARY: 115 mg/dL — AB (ref 65–99)
GLUCOSE-CAPILLARY: 94 mg/dL (ref 65–99)
Glucose-Capillary: 114 mg/dL — ABNORMAL HIGH (ref 65–99)
Glucose-Capillary: 120 mg/dL — ABNORMAL HIGH (ref 65–99)

## 2016-06-14 LAB — GLUCOSE, CAPILLARY
GLUCOSE-CAPILLARY: 79 mg/dL (ref 65–99)
Glucose-Capillary: 152 mg/dL — ABNORMAL HIGH (ref 65–99)
Glucose-Capillary: 116 mg/dL — ABNORMAL HIGH (ref 65–99)
Glucose-Capillary: 117 mg/dL — ABNORMAL HIGH (ref 65–99)

## 2016-06-15 LAB — GLUCOSE, CAPILLARY
GLUCOSE-CAPILLARY: 111 mg/dL — AB (ref 65–99)
GLUCOSE-CAPILLARY: 175 mg/dL — AB (ref 65–99)
Glucose-Capillary: 109 mg/dL — ABNORMAL HIGH (ref 65–99)

## 2016-06-16 LAB — GLUCOSE, CAPILLARY
GLUCOSE-CAPILLARY: 89 mg/dL (ref 65–99)
GLUCOSE-CAPILLARY: 112 mg/dL — AB (ref 65–99)
Glucose-Capillary: 148 mg/dL — ABNORMAL HIGH (ref 65–99)
Glucose-Capillary: 148 mg/dL — ABNORMAL HIGH (ref 65–99)
Glucose-Capillary: 111 mg/dL — ABNORMAL HIGH (ref 65–99)

## 2016-06-17 LAB — GLUCOSE, CAPILLARY
GLUCOSE-CAPILLARY: 147 mg/dL — AB (ref 65–99)
GLUCOSE-CAPILLARY: 148 mg/dL — AB (ref 65–99)
Glucose-Capillary: 90 mg/dL (ref 65–99)
Glucose-Capillary: 102 mg/dL — ABNORMAL HIGH (ref 65–99)

## 2016-06-18 LAB — GLUCOSE, CAPILLARY
GLUCOSE-CAPILLARY: 116 mg/dL — AB (ref 65–99)
GLUCOSE-CAPILLARY: 93 mg/dL (ref 65–99)
GLUCOSE-CAPILLARY: 194 mg/dL — AB (ref 65–99)

## 2016-06-19 LAB — GLUCOSE, CAPILLARY
GLUCOSE-CAPILLARY: 182 mg/dL — AB (ref 65–99)
GLUCOSE-CAPILLARY: 113 mg/dL — AB (ref 65–99)
Glucose-Capillary: 195 mg/dL — ABNORMAL HIGH (ref 65–99)

## 2016-06-20 LAB — GLUCOSE, CAPILLARY
GLUCOSE-CAPILLARY: 115 mg/dL — AB (ref 65–99)
GLUCOSE-CAPILLARY: 123 mg/dL — AB (ref 65–99)
GLUCOSE-CAPILLARY: 131 mg/dL — AB (ref 65–99)
Glucose-Capillary: 117 mg/dL — ABNORMAL HIGH (ref 65–99)

## 2016-06-21 LAB — GLUCOSE, CAPILLARY
GLUCOSE-CAPILLARY: 125 mg/dL — AB (ref 65–99)
GLUCOSE-CAPILLARY: 106 mg/dL — AB (ref 65–99)
GLUCOSE-CAPILLARY: 138 mg/dL — AB (ref 65–99)
Glucose-Capillary: 112 mg/dL — ABNORMAL HIGH (ref 65–99)

## 2016-06-22 LAB — GLUCOSE, CAPILLARY
GLUCOSE-CAPILLARY: 126 mg/dL — AB (ref 65–99)
Glucose-Capillary: 112 mg/dL — ABNORMAL HIGH (ref 65–99)
Glucose-Capillary: 135 mg/dL — ABNORMAL HIGH (ref 65–99)

## 2016-06-23 LAB — GLUCOSE, CAPILLARY
GLUCOSE-CAPILLARY: 121 mg/dL — AB (ref 65–99)
GLUCOSE-CAPILLARY: 148 mg/dL — AB (ref 65–99)
Glucose-Capillary: 138 mg/dL — ABNORMAL HIGH (ref 65–99)
Glucose-Capillary: 108 mg/dL — ABNORMAL HIGH (ref 65–99)

## 2016-06-24 LAB — GLUCOSE, CAPILLARY
GLUCOSE-CAPILLARY: 127 mg/dL — AB (ref 65–99)
GLUCOSE-CAPILLARY: 185 mg/dL — AB (ref 65–99)
Glucose-Capillary: 131 mg/dL — ABNORMAL HIGH (ref 65–99)
Glucose-Capillary: 146 mg/dL — ABNORMAL HIGH (ref 65–99)

## 2016-06-25 LAB — GLUCOSE, CAPILLARY: GLUCOSE-CAPILLARY: 95 mg/dL (ref 65–99)

## 2016-06-26 LAB — GLUCOSE, CAPILLARY
GLUCOSE-CAPILLARY: 101 mg/dL — AB (ref 65–99)
GLUCOSE-CAPILLARY: 122 mg/dL — AB (ref 65–99)
Glucose-Capillary: 102 mg/dL — ABNORMAL HIGH (ref 65–99)
Glucose-Capillary: 207 mg/dL — ABNORMAL HIGH (ref 65–99)

## 2016-06-27 LAB — GLUCOSE, CAPILLARY
Glucose-Capillary: 118 mg/dL — ABNORMAL HIGH (ref 65–99)
Glucose-Capillary: 138 mg/dL — ABNORMAL HIGH (ref 65–99)
Glucose-Capillary: 191 mg/dL — ABNORMAL HIGH (ref 65–99)

## 2016-06-28 LAB — GLUCOSE, CAPILLARY
GLUCOSE-CAPILLARY: 138 mg/dL — AB (ref 65–99)
Glucose-Capillary: 191 mg/dL — ABNORMAL HIGH (ref 65–99)
Glucose-Capillary: 192 mg/dL — ABNORMAL HIGH (ref 65–99)

## 2016-06-29 LAB — GLUCOSE, CAPILLARY
GLUCOSE-CAPILLARY: 86 mg/dL (ref 65–99)
GLUCOSE-CAPILLARY: 168 mg/dL — AB (ref 65–99)
Glucose-Capillary: 97 mg/dL (ref 65–99)
Glucose-Capillary: 114 mg/dL — ABNORMAL HIGH (ref 65–99)

## 2016-06-30 LAB — GLUCOSE, CAPILLARY
Glucose-Capillary: 96 mg/dL (ref 65–99)
Glucose-Capillary: 133 mg/dL — ABNORMAL HIGH (ref 65–99)
Glucose-Capillary: 138 mg/dL — ABNORMAL HIGH (ref 65–99)

## 2016-07-01 LAB — GLUCOSE, CAPILLARY
GLUCOSE-CAPILLARY: 99 mg/dL (ref 65–99)
GLUCOSE-CAPILLARY: 91 mg/dL (ref 65–99)
GLUCOSE-CAPILLARY: 127 mg/dL — AB (ref 65–99)

## 2016-07-02 LAB — GLUCOSE, CAPILLARY
Glucose-Capillary: 105 mg/dL — ABNORMAL HIGH (ref 65–99)
Glucose-Capillary: 119 mg/dL — ABNORMAL HIGH (ref 65–99)
Glucose-Capillary: 108 mg/dL — ABNORMAL HIGH (ref 65–99)

## 2016-07-03 LAB — GLUCOSE, CAPILLARY
GLUCOSE-CAPILLARY: 105 mg/dL — AB (ref 65–99)
Glucose-Capillary: 185 mg/dL — ABNORMAL HIGH (ref 65–99)

## 2016-07-04 LAB — GLUCOSE, CAPILLARY
GLUCOSE-CAPILLARY: 114 mg/dL — AB (ref 65–99)
Glucose-Capillary: 98 mg/dL (ref 65–99)

## 2016-07-05 LAB — GLUCOSE, CAPILLARY: Glucose-Capillary: 172 mg/dL — ABNORMAL HIGH (ref 65–99)

## 2016-07-06 LAB — GLUCOSE, CAPILLARY: GLUCOSE-CAPILLARY: 106 mg/dL — AB (ref 65–99)

## 2016-07-07 LAB — GLUCOSE, CAPILLARY: GLUCOSE-CAPILLARY: 97 mg/dL (ref 65–99)

## 2016-07-08 LAB — GLUCOSE, CAPILLARY: Glucose-Capillary: 90 mg/dL (ref 65–99)

## 2016-07-09 LAB — GLUCOSE, CAPILLARY: Glucose-Capillary: 98 mg/dL (ref 65–99)

## 2016-07-10 LAB — GLUCOSE, CAPILLARY: GLUCOSE-CAPILLARY: 102 mg/dL — AB (ref 65–99)

## 2016-07-11 LAB — GLUCOSE, CAPILLARY: GLUCOSE-CAPILLARY: 96 mg/dL (ref 65–99)

## 2016-07-12 ENCOUNTER — Encounter
Admission: RE | Admit: 2016-07-12 | Payer: Medicare Other | Source: Ambulatory Visit | Attending: Internal Medicine | Admitting: Internal Medicine

## 2016-07-12 ENCOUNTER — Encounter
Admission: RE | Admit: 2016-07-12 | Discharge: 2016-07-12 | Disposition: A | Discharge status: Home / Self Care | Payer: Medicare Other | Source: Ambulatory Visit | Attending: Internal Medicine | Admitting: Internal Medicine

## 2016-07-12 DIAGNOSIS — E119 Type 2 diabetes mellitus without complications: Secondary | ICD-10-CM | POA: Insufficient documentation

## 2016-07-13 DIAGNOSIS — E119 Type 2 diabetes mellitus without complications: Secondary | ICD-10-CM | POA: Diagnosis not present

## 2016-07-13 LAB — GLUCOSE, CAPILLARY: GLUCOSE-CAPILLARY: 111 mg/dL — AB (ref 65–99)

## 2016-07-14 DIAGNOSIS — E119 Type 2 diabetes mellitus without complications: Secondary | ICD-10-CM | POA: Diagnosis not present

## 2016-07-14 LAB — GLUCOSE, CAPILLARY: Glucose-Capillary: 110 mg/dL — ABNORMAL HIGH (ref 65–99)

## 2016-07-15 DIAGNOSIS — E119 Type 2 diabetes mellitus without complications: Secondary | ICD-10-CM | POA: Diagnosis not present

## 2016-07-15 LAB — GLUCOSE, CAPILLARY: Glucose-Capillary: 106 mg/dL — ABNORMAL HIGH (ref 65–99)

## 2016-07-16 DIAGNOSIS — E119 Type 2 diabetes mellitus without complications: Secondary | ICD-10-CM | POA: Diagnosis not present

## 2016-07-16 LAB — GLUCOSE, CAPILLARY: Glucose-Capillary: 116 mg/dL — ABNORMAL HIGH (ref 65–99)

## 2016-07-17 DIAGNOSIS — E119 Type 2 diabetes mellitus without complications: Secondary | ICD-10-CM | POA: Diagnosis not present

## 2016-07-17 LAB — URINALYSIS COMPLETE WITH MICROSCOPIC (ARMC ONLY)
Bilirubin Urine: NEGATIVE
Glucose, UA: NEGATIVE mg/dL
KETONES UR: NEGATIVE mg/dL
Nitrite: POSITIVE — AB
PH: 6 (ref 5.0–8.0)
PROTEIN: NEGATIVE mg/dL
SPECIFIC GRAVITY, URINE: 1.009 (ref 1.005–1.030)

## 2016-07-17 LAB — GLUCOSE, CAPILLARY: Glucose-Capillary: 114 mg/dL — ABNORMAL HIGH (ref 65–99)

## 2016-07-18 ENCOUNTER — Non-Acute Institutional Stay (SKILLED_NURSING_FACILITY): Payer: Medicare Other | Admitting: Gerontology

## 2016-07-18 DIAGNOSIS — I10 Essential (primary) hypertension: Secondary | ICD-10-CM | POA: Diagnosis not present

## 2016-07-18 DIAGNOSIS — N390 Urinary tract infection, site not specified: Secondary | ICD-10-CM

## 2016-07-18 DIAGNOSIS — J449 Chronic obstructive pulmonary disease, unspecified: Secondary | ICD-10-CM | POA: Diagnosis not present

## 2016-07-18 DIAGNOSIS — E119 Type 2 diabetes mellitus without complications: Secondary | ICD-10-CM | POA: Diagnosis not present

## 2016-07-18 LAB — GLUCOSE, CAPILLARY: Glucose-Capillary: 114 mg/dL — ABNORMAL HIGH (ref 65–99)

## 2016-07-19 DIAGNOSIS — E119 Type 2 diabetes mellitus without complications: Secondary | ICD-10-CM | POA: Diagnosis not present

## 2016-07-19 LAB — URINE CULTURE

## 2016-07-19 LAB — GLUCOSE, CAPILLARY: GLUCOSE-CAPILLARY: 114 mg/dL — AB (ref 65–99)

## 2016-07-20 DIAGNOSIS — E119 Type 2 diabetes mellitus without complications: Secondary | ICD-10-CM | POA: Diagnosis not present

## 2016-07-20 LAB — GLUCOSE, CAPILLARY: GLUCOSE-CAPILLARY: 182 mg/dL — AB (ref 65–99)

## 2016-07-21 DIAGNOSIS — E119 Type 2 diabetes mellitus without complications: Secondary | ICD-10-CM | POA: Diagnosis not present

## 2016-07-21 LAB — GLUCOSE, CAPILLARY: GLUCOSE-CAPILLARY: 120 mg/dL — AB (ref 65–99)

## 2016-07-22 DIAGNOSIS — E119 Type 2 diabetes mellitus without complications: Secondary | ICD-10-CM | POA: Diagnosis not present

## 2016-07-22 LAB — GLUCOSE, CAPILLARY: GLUCOSE-CAPILLARY: 117 mg/dL — AB (ref 65–99)

## 2016-07-22 NOTE — Progress Notes (Signed)
Location:      Place of Service:  SNF (31) Provider:  [name], NP-C  [name] Patient  Patient Care Team: [name] Patient as PCP - General (General Practice)  Extended Emergency Contact Information Primary Emergency Contact: [name],[name]          Bruna Potter, [place] Mobile Phone: [phone] Relation: Daughter Secondary Emergency Contact: [name] Address: [address]          [place], [place] [address] [place] Home Phone: [phone] Mobile Phone: [phone] Relation: Daughter  Code Status:  DNR Goals of care: Advanced Directive information Advanced Directives 06/05/2016  Does patient have an advance directive? Yes  Type of Advance Directive -  Does patient want to make changes to advanced directive? -  Copy of advanced directive(s) in chart? -  Would patient like information on creating an advanced directive? -  Pre-existing out of facility DNR order (yellow form or pink MOST form) -     Chief Complaint  Patient presents with  . Follow-up    HPI:  Pt is a 80 y.o. female seen today for medical management of chronic diseases. She has a history of COPD with Oxygen dependence. She is also under the services of Hospice. Her breathing has been doing well as of late, without exacerbation. Nursing reports no cough, congestion, wheezing. She does continue to get up unassisted frequently. BPs well controlled without the use of medications. Mild elevations at times with pain, agitation. Nursing reports increased confusion, trying to get up unassisted. UA obtained. E-Coli UTI. Pt denies n/v/d/f/c/cp/sob/ha/abd pain. No s/s of sepsis. VSS. No other complaints.   Past Medical History:  Diagnosis Date  . Anemia    "as a child" (08/05/2013)  . Anxiety   . Arthritis    "all over me; in all my joints" (08/05/2013)  . Atrial fibrillation (HCC)    a. Chronic; No longer on coumadin 2/2 frequent falls.  . Chest pain    a. 08/2011  Myoview: sm, fixed anteroseptal defect (attenuation), no ischemia, EF 62%.  . CHF (congestive heart failure) (HCC)   . Coccyx pain   . COPD (chronic obstructive pulmonary disease) (HCC)   . Dementia   . Depression   . Falls frequently    coumadin stopped  . GERD (gastroesophageal reflux disease)   . Gout    "right foot" (08/05/2013)  . H/O hiatal hernia   . History of pneumonia    "couple times; long time ago" (08/05/2013)  . Hypercholesterolemia   . Hypertension   . Hypertrophic cardiomyopathy (HCC)    a. 11/2013 Echo: EF 55-60%, basal inf HK, sev dil LA, no evidence of HCM.  PASP .  [name] Hyponatremia   . Hyposmolality   . Osteoarthritis   . Oxygen dependent    "3L 24/7" (08/05/2013)  . Pacemaker    a. 04/2012 MDT [id number] [employer] PPM, ser #: [id number] H.  . Pulmonary HTN (HCC)    a. has had prior right heart cath in 2010; was felt that most likely due to elevated left sided pressures and would not benefit from vasodilator therapy;  b. 11/2013 Echo: PASP .  . Situational mixed anxiety and depressive disorder   . TIA (transient ischemic attack)   . Type II diabetes mellitus (HCC)   . Varicose veins    Past Surgical History:  Procedure Laterality Date  . ABDOMINAL HYSTERECTOMY     "partial" (08/05/2013)  . APPENDECTOMY    .  BACK SURGERY     "bone spur removed; mid back" (08/05/2013)  . CARDIAC CATHETERIZATION     "more than once" (08/05/2013)  . DILATION AND CURETTAGE OF UTERUS     "several" (08/05/2013)  . FOOT NEUROMA SURGERY Right   . INSERT / REPLACE / REMOVE PACEMAKER  1998; 2000's; 2014  . PACEMAKER GENERATOR CHANGE N/A 04/24/2012   Procedure: PACEMAKER GENERATOR CHANGE;  Surgeon: Duke Salvia, MD;  Location: [facility] CATH [facility];  Service: Cardiovascular;  Laterality: N/A;  . TONSILLECTOMY    . TOTAL KNEE ARTHROPLASTY Right 2011    Allergies  Allergen Reactions  . Aspirin Nausea And Vomiting  . Calan [Verapamil Hcl] Other (See Comments)    Patient states it makes  her "out of her mind"; bp bottoms out (takes diltiazem at home)  . Crestor [Rosuvastatin Calcium] Other (See Comments)    Muscle pain  . Escitalopram Oxalate Other (See Comments)    Reaction Unknown   . Lipitor [Atorvastatin Calcium] Other (See Comments)    myalgia  . Lopressor [Metoprolol Tartrate] Other (See Comments)    Blood pressure bottoms out   . Nitroglycerin Other (See Comments)    Reaction unknown  . Norvasc [Amlodipine Besylate] Other (See Comments)    fatigue  . Nsaids Nausea Only  . Oxycodone Other (See Comments)    Reaction unknown  . Prednisone Swelling  . Sulfa Antibiotics Nausea Only and Other (See Comments)    Makes her stomach hurt  . Vimovo [Naproxen-Esomeprazole] Nausea Only  . Penicillins Hives and Rash      Medication List       Accurate as of 07/18/16 11:59 PM. Always use your most recent med list.          acetaminophen 325 MG tablet Commonly known as:  TYLENOL Take 650 mg by mouth 4 (four) times daily.   ALPRAZolam 0.25 MG tablet Commonly known as:  XANAX Take 0.25 mg by mouth 2 (two) times daily.   CHLORASEPTIC 6-10 MG lozenge Generic drug:  benzocaine-menthol Take 1 lozenge by mouth every 2 (two) hours as needed for sore throat.   clopidogrel 75 MG tablet Commonly known as:  PLAVIX Take 1 tablet (75 mg total) by mouth daily.   DIABETIC TUSSIN MAX ST 10-200 MG/5ML Liqd Generic drug:  Dextromethorphan-Guaifenesin Take 10 mLs by mouth every 6 (six) hours as needed (for cough).   diltiazem 180 MG 24 hr capsule Commonly known as:  TIAZAC Take 180 mg by mouth daily.   divalproex 125 MG capsule Commonly known as:  DEPAKOTE SPRINKLE Take 250 mg by mouth 2 (two) times daily.   haloperidol 0.5 MG tablet Commonly known as:  HALDOL Take 0.5 mg by mouth every 8 (eight) hours as needed for agitation.   mirtazapine 15 MG disintegrating tablet Commonly known as:  REMERON SOL-TAB Take 15 mg by mouth at bedtime.   morphine 20 MG/ML  concentrated solution Commonly known as:  ROXANOL Take 0.5 mLs (10 mg total) by mouth every 4 (four) hours as needed for severe pain.   oxybutynin 15 MG 24 hr tablet Commonly known as:  DITROPAN XL Take 15 mg by mouth every morning.   senna 8.6 MG Tabs tablet Commonly known as:  SENOKOT Take 1 tablet by mouth 2 (two) times daily.       Review of Systems  Unable to perform ROS: Dementia  Constitutional: Negative for activity change, appetite change and fever.  HENT: Negative for congestion, trouble swallowing and voice change.  Respiratory: Negative for apnea, cough, choking, shortness of breath and wheezing.   Cardiovascular: Negative for chest pain, palpitations and leg swelling.  Gastrointestinal: Negative for abdominal distention and abdominal pain.  Musculoskeletal: Positive for arthralgias (typical arthritis). Negative for back pain, gait problem and myalgias.  Skin: Negative for color change, pallor, rash and wound.  Psychiatric/Behavioral: Negative for agitation and behavioral problems.  All other systems reviewed and are negative.   Immunization History  Administered Date(s) Administered  . Influenza Split 09/11/2012  . Influenza Whole 10/15/2010  . Influenza-Unspecified 08/10/2013  . PPD Test 08/24/2013  . Pneumococcal Polysaccharide-23 10/15/2010  . Pneumococcal-Unspecified 08/10/2013  . Tdap 10/01/2013   Pertinent  Health Maintenance Due  Topic Date Due  . FOOT EXAM  06/18/1946  . OPHTHALMOLOGY EXAM  06/18/1946  . URINE MICROALBUMIN  06/18/1946  . PNA vac Low Risk Adult (2 of 2 - PCV13) 08/10/2014  . INFLUENZA VACCINE  06/11/2016  . HEMOGLOBIN A1C  08/04/2016  . DEXA SCAN  Completed   Fall Risk  05/18/2013  Falls in the past year? Yes  Number falls in past yr: 1  Injury with Fall? No  Risk for fall due to : Impaired balance/gait;Impaired mobility   Functional Status Survey:    Vitals:   07/22/16 1051  BP: (!) 181/68  Pulse: 63  Resp: 14  Temp:  98.2 F (36.8 C)  SpO2: 99%  Weight: 128 lb 4.8 oz (58.2 kg)   Body mass index is 21.35 kg/m. Physical Exam  Constitutional: She is oriented to person, place, and time. Vital signs are normal. She appears well-developed and well-nourished. She is active and cooperative. She does not appear ill. No distress.  HENT:  Head: Normocephalic and atraumatic.  Mouth/Throat: Uvula is midline, oropharynx is clear and moist and mucous membranes are normal. Mucous membranes are not pale, not dry and not cyanotic.  Eyes: EOM and lids are normal.  Neck: Trachea normal, normal range of motion and full passive range of motion without pain. No JVD present. No tracheal deviation, no edema and no erythema present.  Cardiovascular: Normal rate, regular rhythm, intact distal pulses and normal pulses.  Exam reveals no gallop, no distant heart sounds and no friction rub.   No murmur heard. Pulmonary/Chest: Effort normal and breath sounds normal. No accessory muscle usage. No respiratory distress. She has no wheezes. She has no rales. She exhibits no tenderness.  Abdominal: Normal appearance and bowel sounds are normal. She exhibits no distension and no ascites. There is no tenderness.  Musculoskeletal: She exhibits no edema.  Expected osteoarthritis, stiffness  Neurological: She is alert and oriented to person, place, and time. She has normal strength.  Skin: Skin is warm, dry and intact. No rash noted. She is not diaphoretic. No cyanosis or erythema. No pallor. Nails show no clubbing.  Psychiatric: She has a normal mood and affect. Her speech is normal and behavior is normal. Judgment and thought content normal. Cognition and memory are normal.  Nursing note and vitals reviewed.   Labs reviewed:  Recent Labs  02/04/16 0525 04/28/16 0450 06/05/16 0726  NA 138 138 140  K 3.8 4.4 3.5  CL 105 105 102  CO2 22 24 30   GLUCOSE 120* 85 125*  BUN 14 33* 26*  CREATININE 0.75 0.79 0.80  CALCIUM 9.6 9.3 9.7     Recent Labs  02/01/16 2126 02/04/16 0525 04/28/16 0450  AST 25 29 22   ALT 11* 12* 8*  ALKPHOS 60 65 57  BILITOT  0.6 1.2 0.5  PROT 6.5 6.9 6.7  ALBUMIN 3.6 3.5 3.9    Recent Labs  02/01/16 2126  02/04/16 0525 04/28/16 0450 06/05/16 0726  WBC 5.9  --  5.6 5.7 5.4  NEUTROABS 3.6  --   --  3.2 2.6  HGB 12.2  < > 13.7 11.9* 13.7  HCT 37.5  < > 40.6 35.5 39.9  MCV 99.7  --  96.9 99.6 98.6  PLT 182  --  186 163 201  < > = values in this interval not displayed. Lab Results  Component Value Date   TSH 1.674 02/07/2015   Lab Results  Component Value Date   HGBA1C 5.4 02/02/2016   Lab Results  Component Value Date   CHOL 254 (H) 02/04/2016   HDL 45 02/04/2016   LDLCALC 172 (H) 02/04/2016   TRIG 187 (H) 02/04/2016   CHOLHDL 5.6 02/04/2016    Significant Diagnostic Results in last 30 days:  No results found.   Assessment/Plan 1. Chronic obstructive pulmonary disease, unspecified COPD type (HCC) Continue oxygen as listed below. Pt is under the care of Hospice services. Continue Hospice. COPD well controlled without recent exacerbations.   2. Benign Essential Hypertension  Not on any medications at this time  Symptoms typically well controlled  Continue to monitor  3. Urinary Tract Infection  Cipro 250 mg po Q 12 hours x 5 days  Family/ staff Communication:   Total Time: 20 minutes  Documentation: 10 minutes  Face to Face: 10 minutes  Family/Phone:   Labs/tests ordered:  none  Medications reviewed and assessed for continued appropriateness. Monthly medication orders signed.   Brynda RimShannon H. Naydelin Ziegler, NP-C Geriatrics [facility] [phone] [address] [place], [place] [address] Cell Phone (Mon-Fri 8am-5pm):  [phone] 367 2014 On Call:  [phone] & follow prompts after 5pm & weekends Office Phone:  [phone] Office Fax:  [phone]

## 2016-07-23 DIAGNOSIS — E119 Type 2 diabetes mellitus without complications: Secondary | ICD-10-CM | POA: Diagnosis not present

## 2016-07-23 LAB — GLUCOSE, CAPILLARY: Glucose-Capillary: 105 mg/dL — ABNORMAL HIGH (ref 65–99)

## 2016-07-24 DIAGNOSIS — E119 Type 2 diabetes mellitus without complications: Secondary | ICD-10-CM | POA: Diagnosis not present

## 2016-07-24 LAB — GLUCOSE, CAPILLARY: GLUCOSE-CAPILLARY: 120 mg/dL — AB (ref 65–99)

## 2016-07-25 DIAGNOSIS — E119 Type 2 diabetes mellitus without complications: Secondary | ICD-10-CM | POA: Diagnosis not present

## 2016-07-25 LAB — GLUCOSE, CAPILLARY: Glucose-Capillary: 118 mg/dL — ABNORMAL HIGH (ref 65–99)

## 2016-07-26 DIAGNOSIS — E119 Type 2 diabetes mellitus without complications: Secondary | ICD-10-CM | POA: Diagnosis not present

## 2016-07-26 LAB — GLUCOSE, CAPILLARY: Glucose-Capillary: 99 mg/dL (ref 65–99)

## 2016-07-27 DIAGNOSIS — E119 Type 2 diabetes mellitus without complications: Secondary | ICD-10-CM | POA: Diagnosis not present

## 2016-07-27 LAB — GLUCOSE, CAPILLARY: GLUCOSE-CAPILLARY: 124 mg/dL — AB (ref 65–99)

## 2016-07-28 DIAGNOSIS — E119 Type 2 diabetes mellitus without complications: Secondary | ICD-10-CM | POA: Diagnosis not present

## 2016-07-28 LAB — GLUCOSE, CAPILLARY: GLUCOSE-CAPILLARY: 147 mg/dL — AB (ref 65–99)

## 2016-07-29 DIAGNOSIS — E119 Type 2 diabetes mellitus without complications: Secondary | ICD-10-CM | POA: Diagnosis not present

## 2016-07-29 LAB — GLUCOSE, CAPILLARY: Glucose-Capillary: 130 mg/dL — ABNORMAL HIGH (ref 65–99)

## 2016-07-30 DIAGNOSIS — E119 Type 2 diabetes mellitus without complications: Secondary | ICD-10-CM | POA: Diagnosis not present

## 2016-07-30 LAB — GLUCOSE, CAPILLARY: Glucose-Capillary: 112 mg/dL — ABNORMAL HIGH (ref 65–99)

## 2016-07-31 DIAGNOSIS — E119 Type 2 diabetes mellitus without complications: Secondary | ICD-10-CM | POA: Diagnosis not present

## 2016-07-31 LAB — GLUCOSE, CAPILLARY: GLUCOSE-CAPILLARY: 129 mg/dL — AB (ref 65–99)

## 2016-08-01 DIAGNOSIS — E119 Type 2 diabetes mellitus without complications: Secondary | ICD-10-CM | POA: Diagnosis not present

## 2016-08-01 LAB — GLUCOSE, CAPILLARY: Glucose-Capillary: 110 mg/dL — ABNORMAL HIGH (ref 65–99)

## 2016-08-02 DIAGNOSIS — E119 Type 2 diabetes mellitus without complications: Secondary | ICD-10-CM | POA: Diagnosis not present

## 2016-08-02 LAB — GLUCOSE, CAPILLARY: GLUCOSE-CAPILLARY: 115 mg/dL — AB (ref 65–99)

## 2016-08-04 DIAGNOSIS — E119 Type 2 diabetes mellitus without complications: Secondary | ICD-10-CM | POA: Diagnosis not present

## 2016-08-04 LAB — GLUCOSE, CAPILLARY: Glucose-Capillary: 171 mg/dL — ABNORMAL HIGH (ref 65–99)

## 2016-08-05 DIAGNOSIS — E119 Type 2 diabetes mellitus without complications: Secondary | ICD-10-CM | POA: Diagnosis not present

## 2016-08-05 LAB — GLUCOSE, CAPILLARY: GLUCOSE-CAPILLARY: 104 mg/dL — AB (ref 65–99)

## 2016-08-06 DIAGNOSIS — E119 Type 2 diabetes mellitus without complications: Secondary | ICD-10-CM | POA: Diagnosis not present

## 2016-08-06 LAB — GLUCOSE, CAPILLARY: GLUCOSE-CAPILLARY: 101 mg/dL — AB (ref 65–99)

## 2016-08-07 DIAGNOSIS — E119 Type 2 diabetes mellitus without complications: Secondary | ICD-10-CM | POA: Diagnosis not present

## 2016-08-07 LAB — GLUCOSE, CAPILLARY: GLUCOSE-CAPILLARY: 109 mg/dL — AB (ref 65–99)

## 2016-08-08 DIAGNOSIS — E119 Type 2 diabetes mellitus without complications: Secondary | ICD-10-CM | POA: Diagnosis not present

## 2016-08-08 LAB — GLUCOSE, CAPILLARY
GLUCOSE-CAPILLARY: 109 mg/dL — AB (ref 65–99)
Glucose-Capillary: 153 mg/dL — ABNORMAL HIGH (ref 65–99)

## 2016-08-09 DIAGNOSIS — E119 Type 2 diabetes mellitus without complications: Secondary | ICD-10-CM | POA: Diagnosis not present

## 2016-08-09 LAB — GLUCOSE, CAPILLARY
Glucose-Capillary: 109 mg/dL — ABNORMAL HIGH (ref 65–99)
Glucose-Capillary: 162 mg/dL — ABNORMAL HIGH (ref 65–99)

## 2016-08-10 DIAGNOSIS — E119 Type 2 diabetes mellitus without complications: Secondary | ICD-10-CM | POA: Diagnosis not present

## 2016-08-10 LAB — GLUCOSE, CAPILLARY: Glucose-Capillary: 132 mg/dL — ABNORMAL HIGH (ref 65–99)

## 2016-08-11 ENCOUNTER — Encounter
Admission: RE | Admit: 2016-08-11 | Discharge: 2016-08-11 | Disposition: A | Discharge status: Home / Self Care | Source: Ambulatory Visit | Attending: Internal Medicine | Admitting: Internal Medicine

## 2016-08-11 DIAGNOSIS — E119 Type 2 diabetes mellitus without complications: Secondary | ICD-10-CM | POA: Insufficient documentation

## 2016-08-11 LAB — GLUCOSE, CAPILLARY: GLUCOSE-CAPILLARY: 125 mg/dL — AB (ref 65–99)

## 2016-09-02 LAB — GLUCOSE, CAPILLARY: Glucose-Capillary: 132 mg/dL — ABNORMAL HIGH (ref 65–99)

## 2016-09-02 IMAGING — CR DG HIP COMPLETE 2+V*L*
1 series · 3 of 3 positions shown · non-contrast
Comparison: 08/29/2014

CLINICAL DATA: Left hip pain after fall from bed. Initial
encounter.

EXAM:
LEFT HIP (WITH PELVIS) 2-3 VIEWS

[Series 1: dxr hip left complete · 0.14mm/px · 3 of 3 slices shown]
[im 1/3]
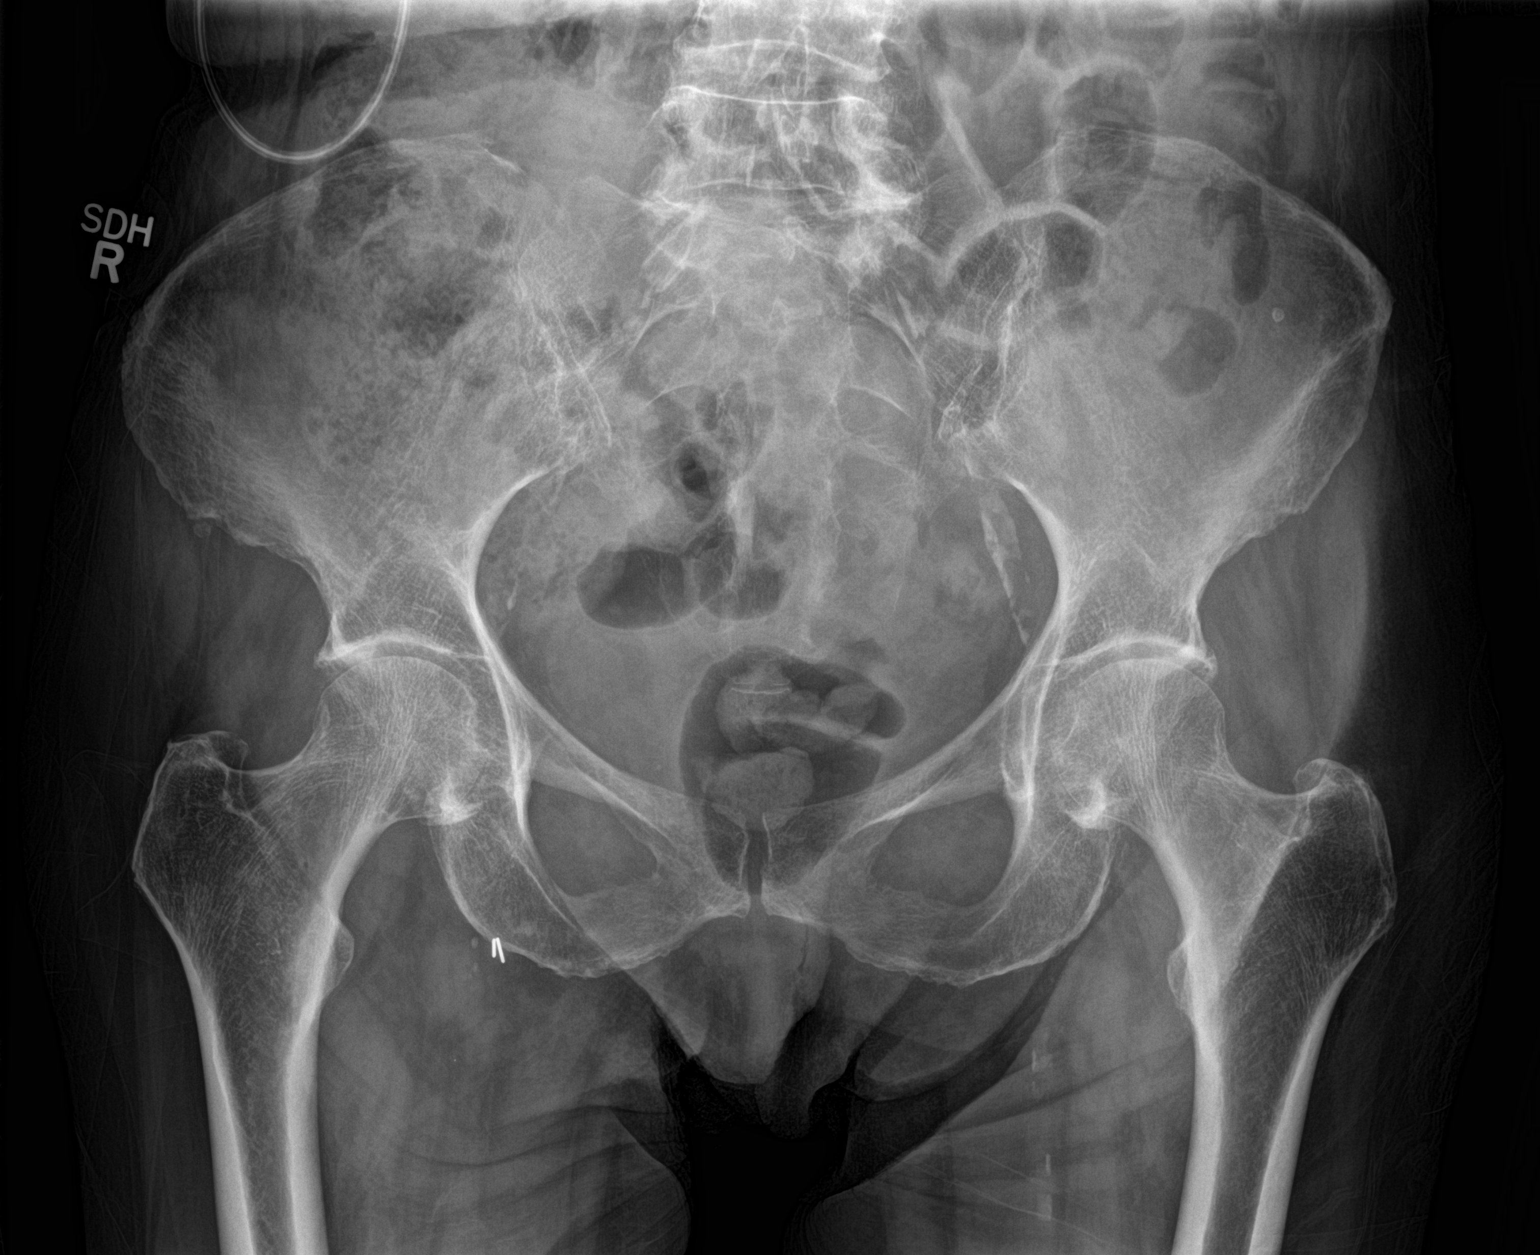
[im 2/3]
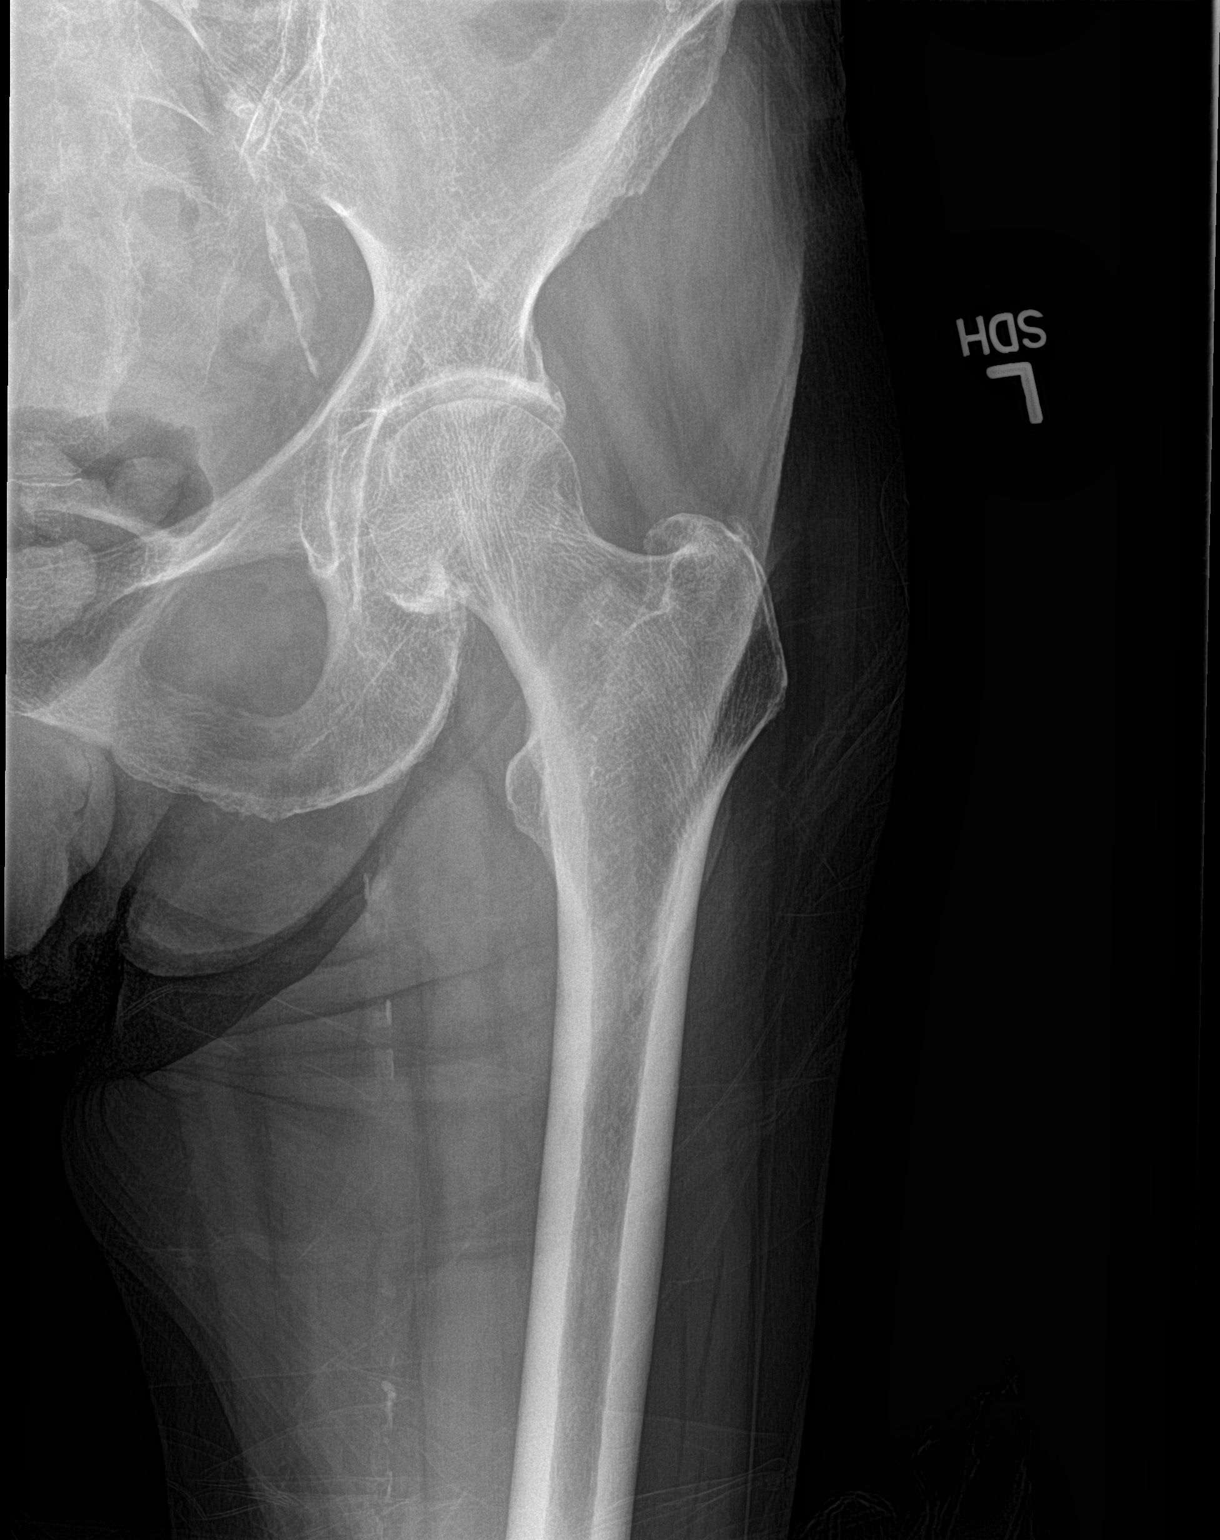
[im 3/3]
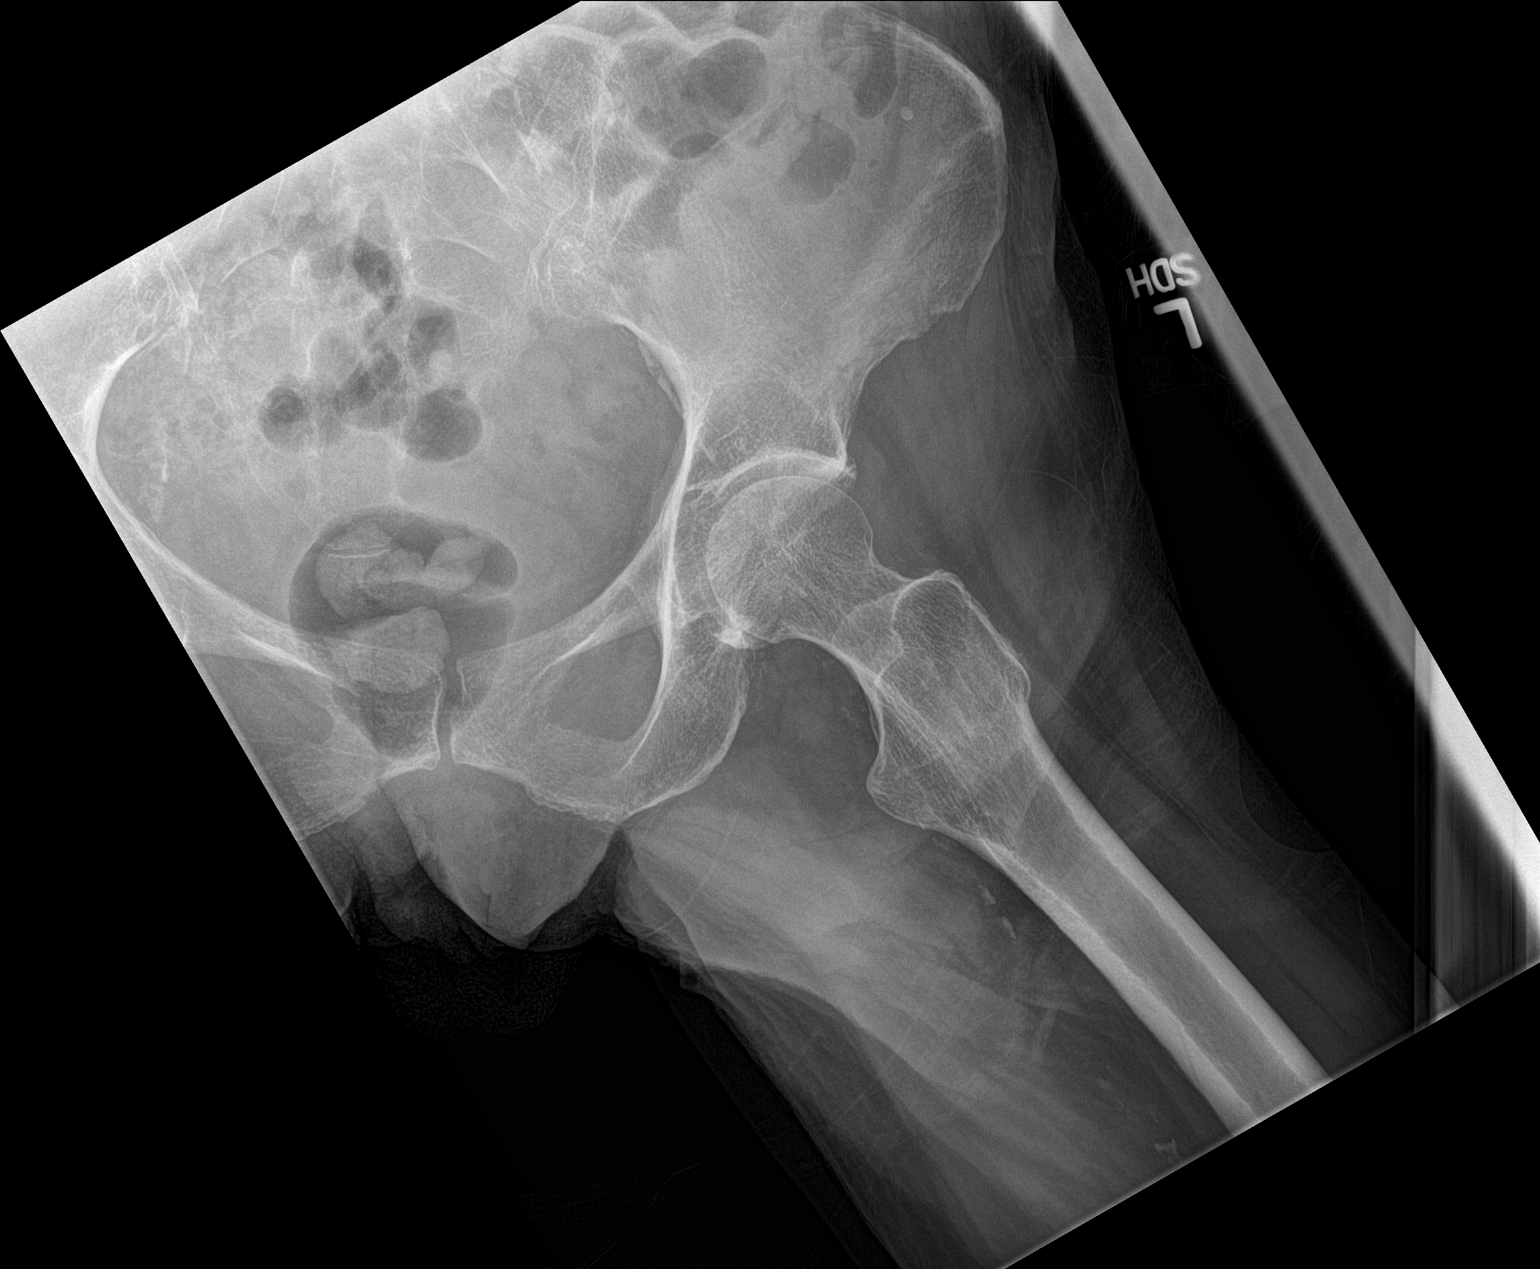

[3 of 3 positions shown; findings below may reference images not displayed]

FINDINGS: There is no evidence of hip fracture or dislocation.

Osteopenia and atherosclerosis.
IMPRESSION: No acute osseous findings.

## 2016-09-02 IMAGING — CT CT HEAD WITHOUT CONTRAST
3 of 5 series · 17 of 30 positions shown, 19 images · non-contrast
Comparison: Head and cervical spine CT scans 11/22/2014 and
10/06/2014.

CLINICAL DATA: Patient found down today. Pain at the base of the
skull. Bruise on the right side of the forehead. Initial encounter.

EXAM:
CT HEAD WITHOUT CONTRAST
CT CERVICAL SPINE WITHOUT CONTRAST
TECHNIQUE: Multidetector CT imaging of the head and cervical spine was
performed following the standard protocol without intravenous
contrast. Multiplanar CT image reconstructions of the cervical spine
were also generated.

[Series 3: bone · axial · 0.42mm/px · z∈[-148,-18]mm · 7 of 87 slices shown]
[im 11/87  bone]
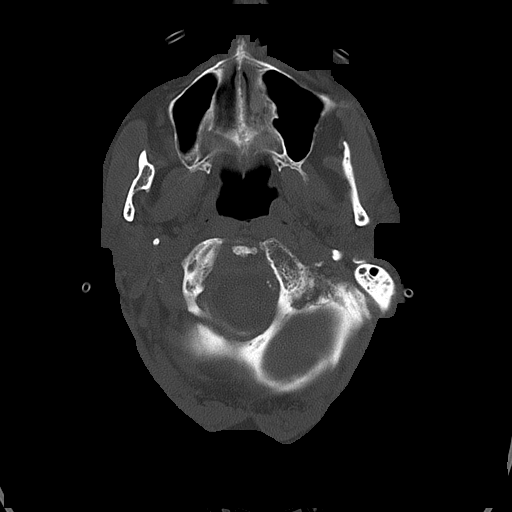
[im 22/87  bone]
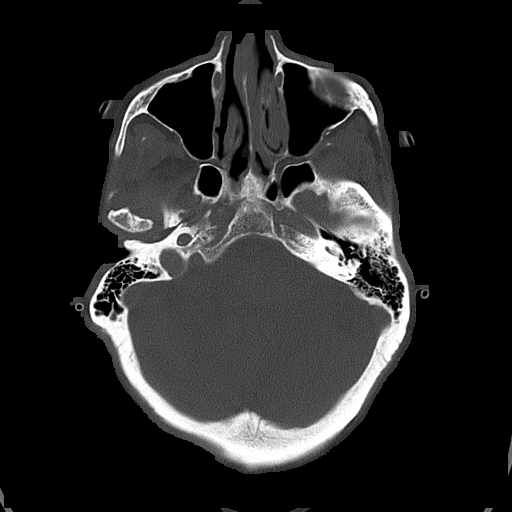
[im 33/87  bone]
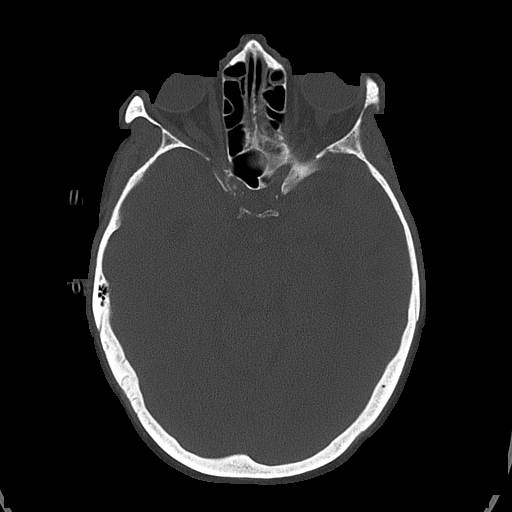
[im 44/87  bone]
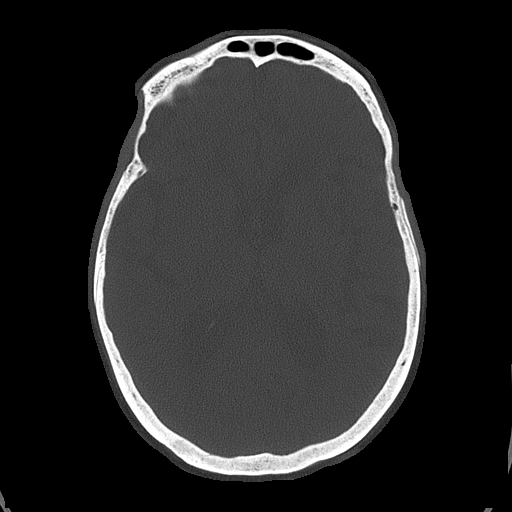
[im 54/87  bone]
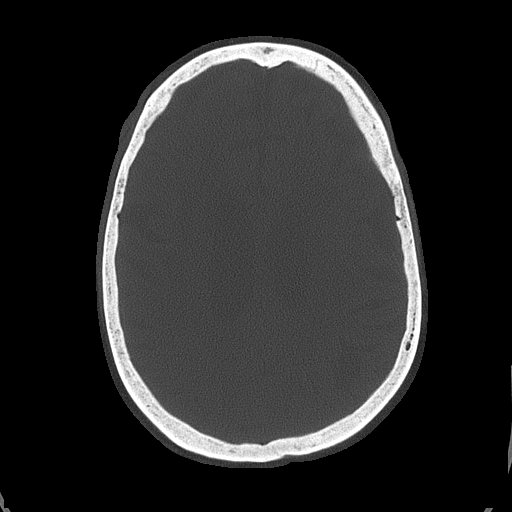
[im 65/87  bone]
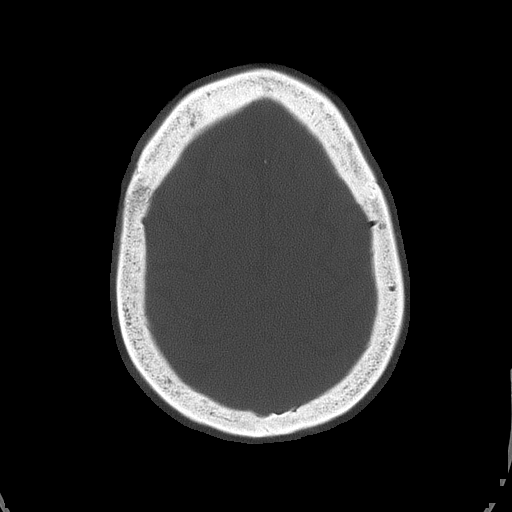
[im 76/87  bone]
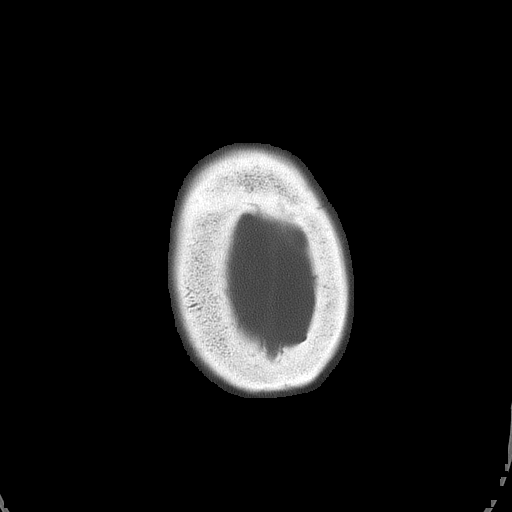

[Series 7: c spine soft · axial · 0.35mm/px · z∈[-234,-182]mm · 3 of 65 slices shown]
[im 13/65  brain]
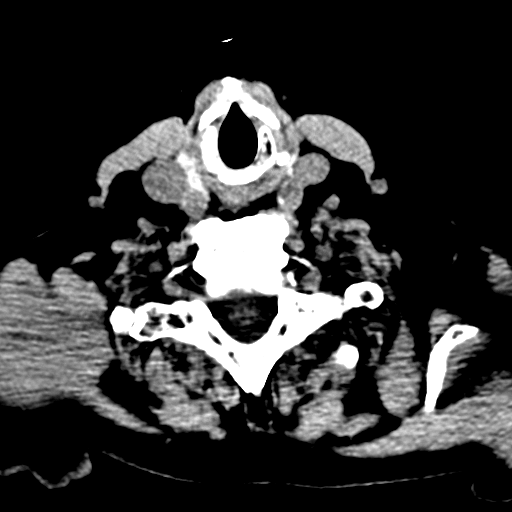
[im 26/65  brain]
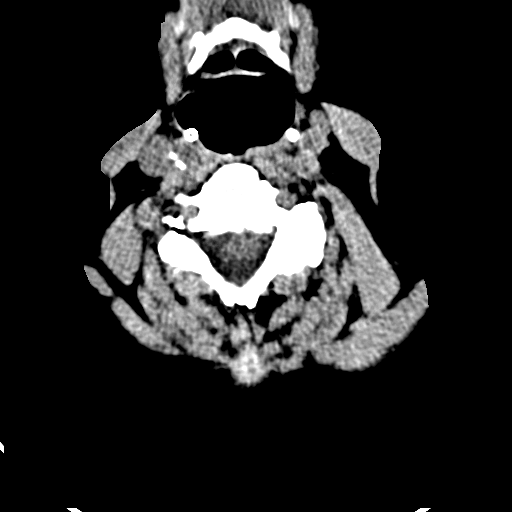
[im 39/65  brain]
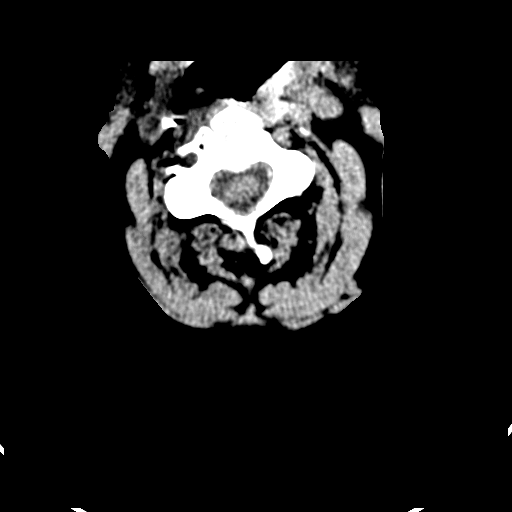

[Series 12: orthogonal axials · axial · 0.29mm/px · z∈[-286,-168]mm · 7 of 93 slices shown, 9 images]
[im 12/93  brain]
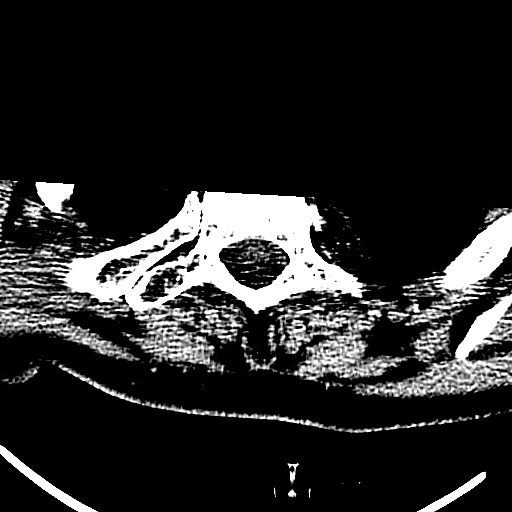
[im 12/93  bone]
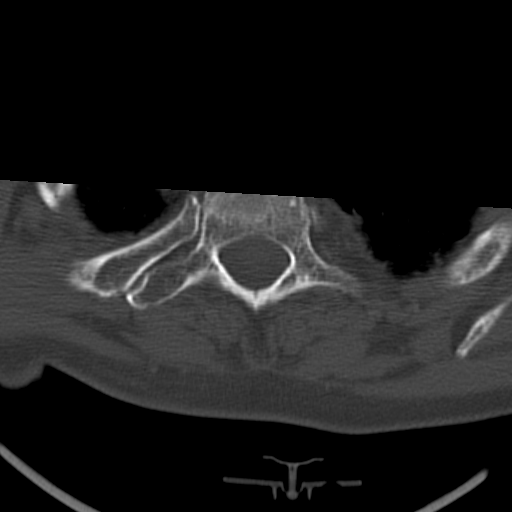
[im 24/93  brain]
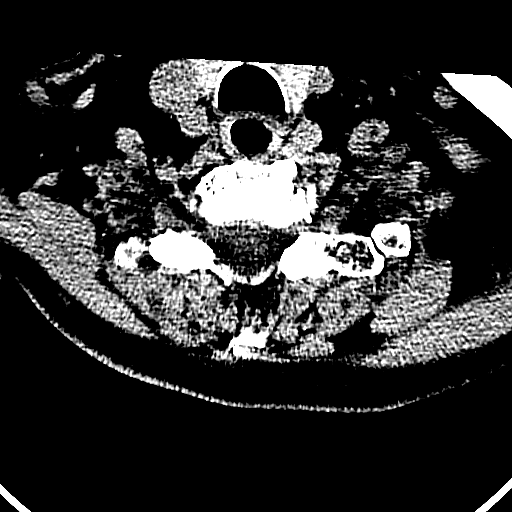
[im 35/93  brain]
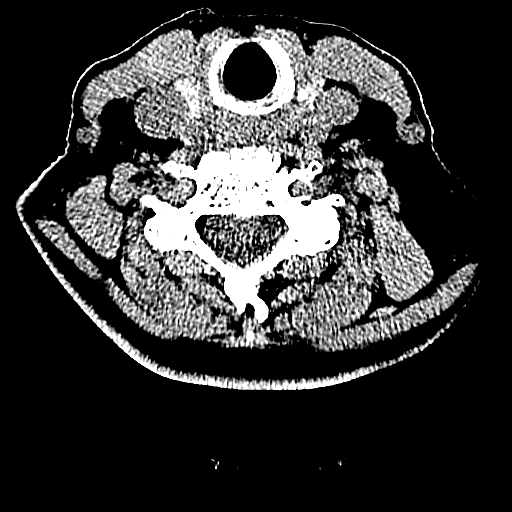
[im 47/93  brain]
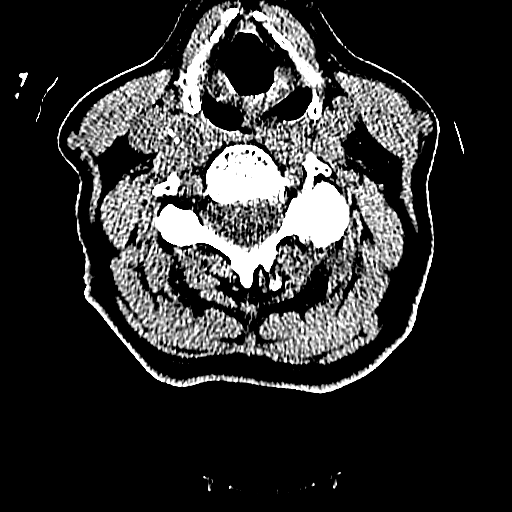
[im 58/93  brain]
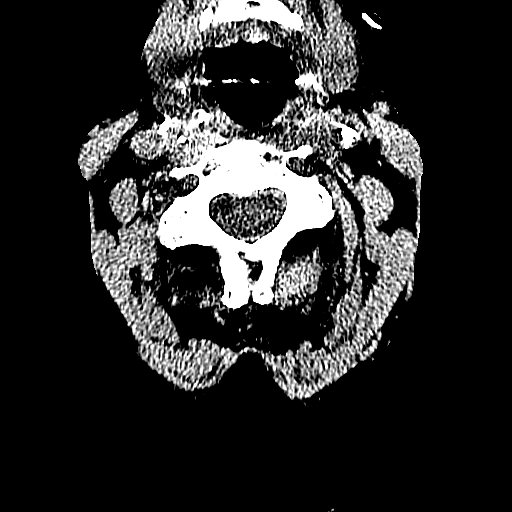
[im 58/93  bone]
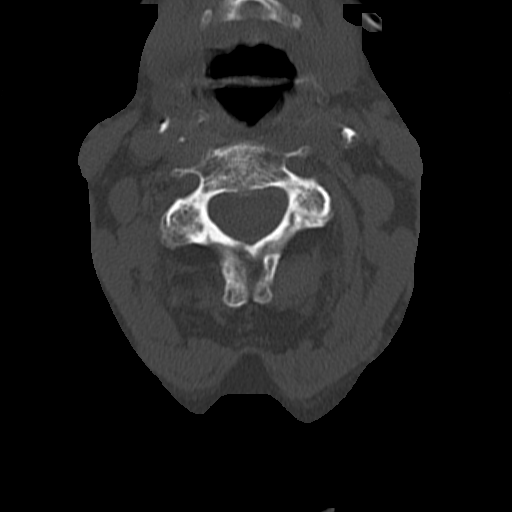
[im 70/93  brain]
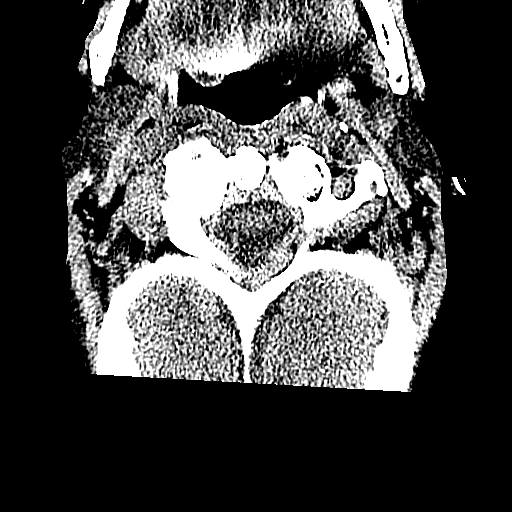
[im 81/93  brain]
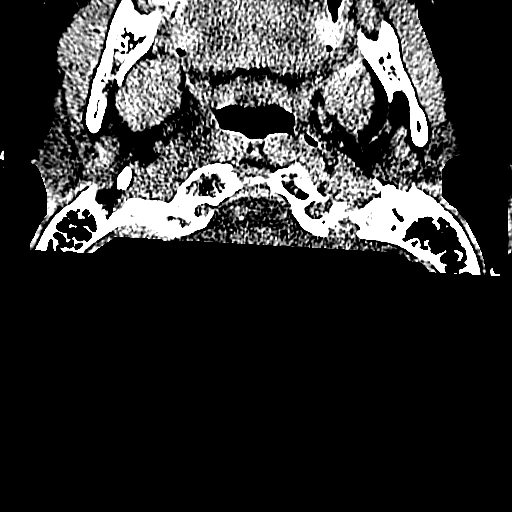

[17 of 30 positions shown; findings below may reference images not displayed]

FINDINGS: CT HEAD FINDINGS

Contusion over the right frontal bone is identified. The brain is
atrophic with patchy and confluent hypoattenuation in the
subcortical and periventricular deep white matter consistent with
chronic microvascular ischemic change. No evidence of acute
abnormality including infarct, hemorrhage, mass lesion, mass effect,
midline shift or abnormal extra-axial fluid collection is
identified. There is no hydrocephalus or pneumocephalus. The
calvarium is intact. Imaged paranasal sinuses and mastoid air cells
are clear.

CT CERVICAL SPINE FINDINGS

There is no fracture or malalignment cervical spine. Loss of disc
space height and endplate spurring are most notable at C5-6 and
C6-7. Multilevel facet degenerative disease identified. Lung apices
are clear.
IMPRESSION: Soft tissue contusion over the right frontal bone. Negative for
acute intracranial abnormality or acute abnormality of the cervical
spine.

Atrophy and chronic microvascular ischemic change.

Cervical spondylosis.

## 2016-09-02 IMAGING — CR DG CHEST 2V
1 series · 2 of 2 positions shown · non-contrast
Comparison: 10/29/2014

CLINICAL DATA: Fall from bed with forehead hematoma and right chest
pain.

EXAM:
CHEST  2 VIEW

[Series 1: dxr chest pa (or ap) and lateral · 0.14mm/px · 2 of 2 slices shown]
[im 1/2]
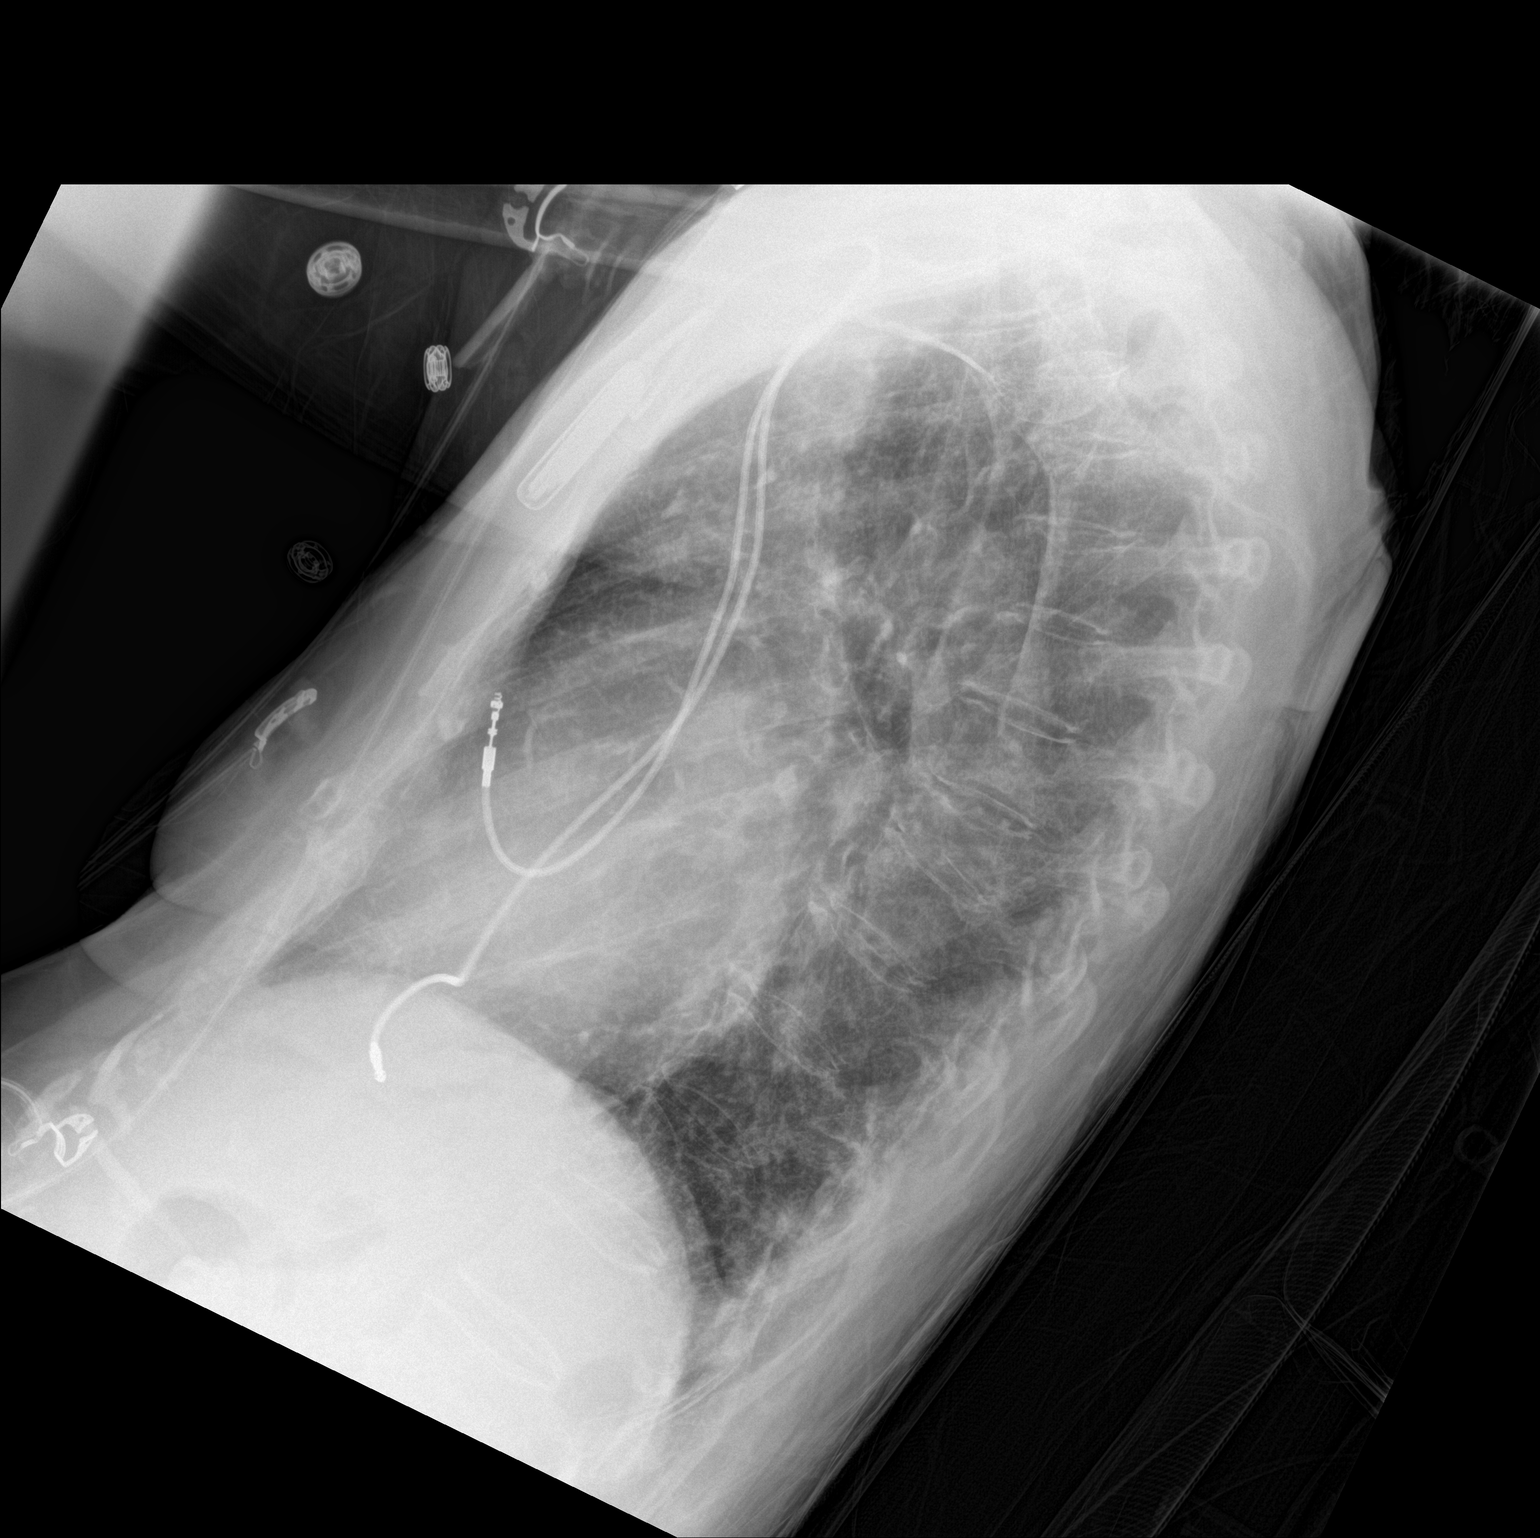
[im 2/2]
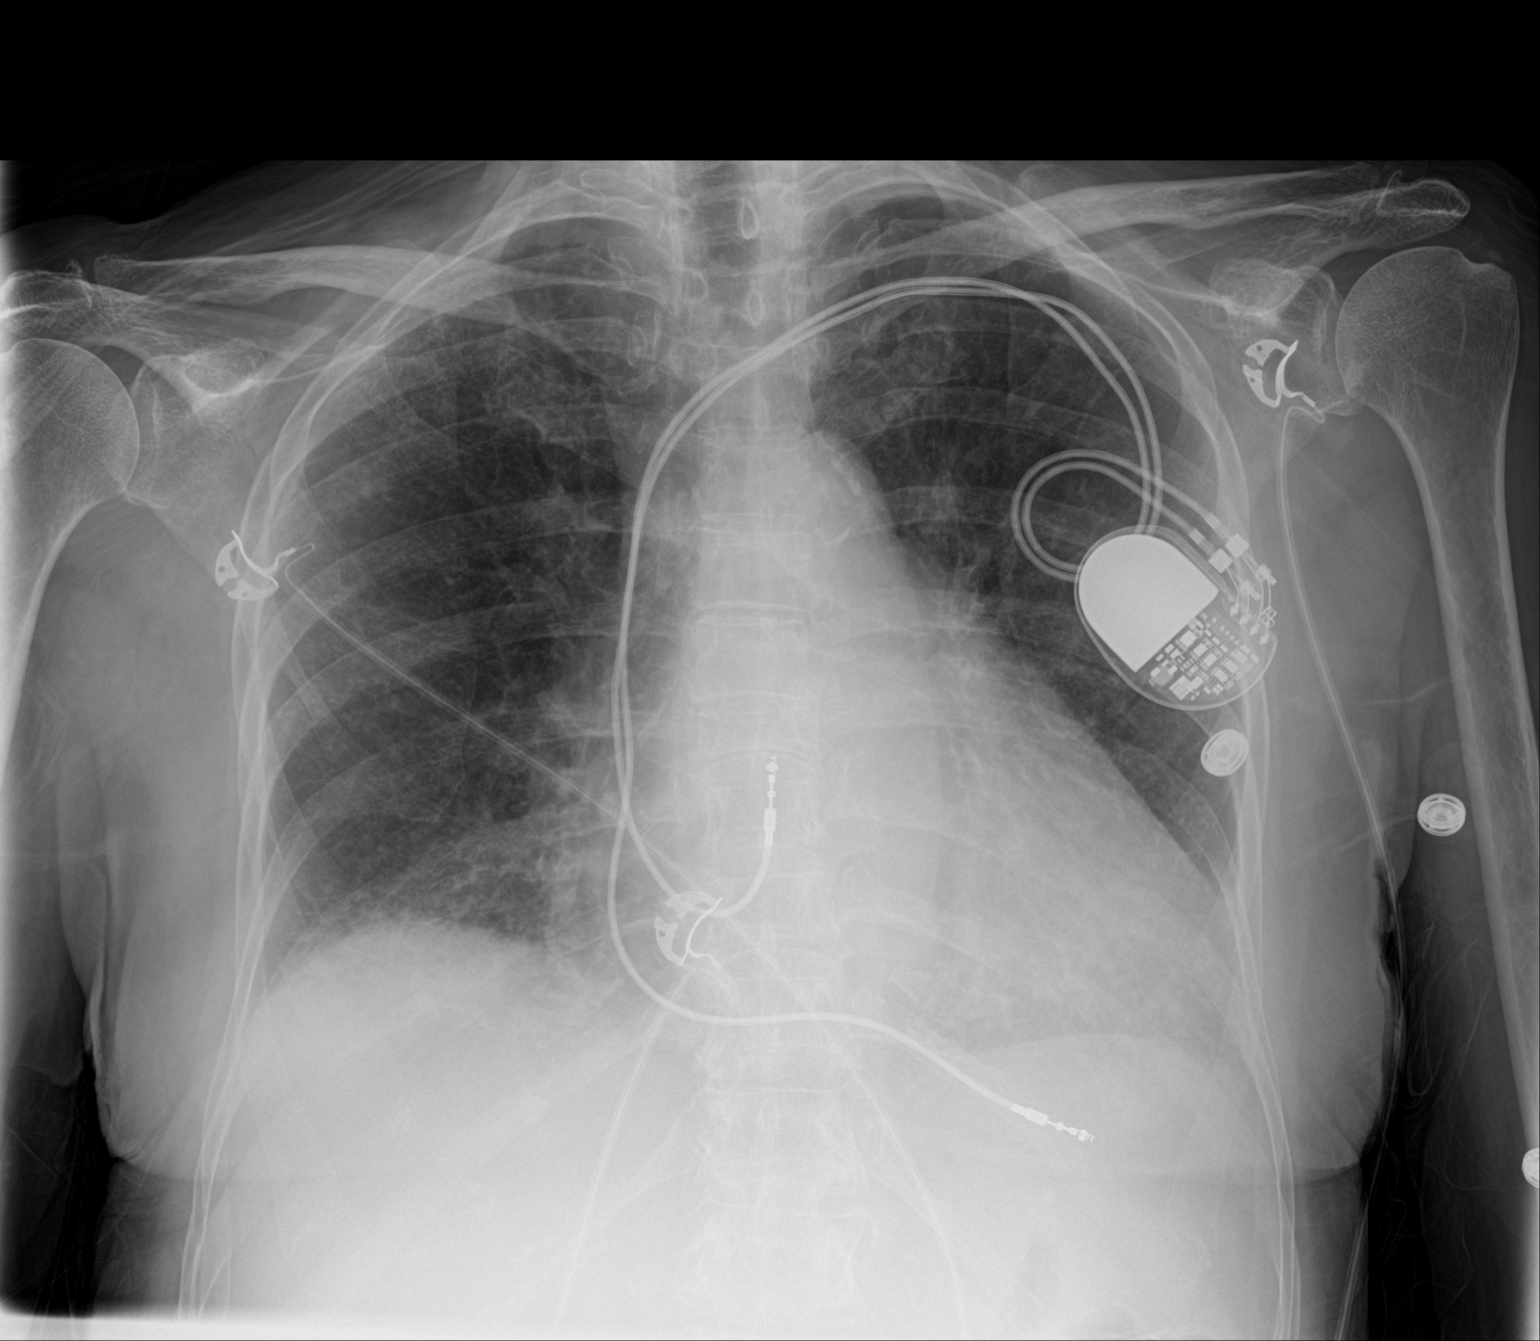

[2 of 2 positions shown; findings below may reference images not displayed]

FINDINGS: Stable positioning of dual-chamber pacer leads from the left.
Chronic cardiomegaly and stable mediastinal contours.

There is chronic reticular markings in the lower lungs, likely
interstitial lung disease based on thoracic spine CT 11/17/2013.
There is no edema, consolidation, effusion, or pneumothorax. Chronic
T3, T5 and T8 compression fracture. No acute osseous findings.
IMPRESSION: 1. No acute cardiopulmonary disease.
2. Chronic lung disease.
3. Remote thoracic compression fractures.

## 2016-09-03 LAB — GLUCOSE, CAPILLARY: GLUCOSE-CAPILLARY: 93 mg/dL (ref 65–99)

## 2016-09-04 LAB — GLUCOSE, CAPILLARY: Glucose-Capillary: 114 mg/dL — ABNORMAL HIGH (ref 65–99)

## 2016-09-05 LAB — GLUCOSE, CAPILLARY: GLUCOSE-CAPILLARY: 114 mg/dL — AB (ref 65–99)

## 2016-09-05 IMAGING — CT CT ABD-PELV W/ CM
1 of 3 series · 14 of 32 positions shown, 19 images · IV contrast (omnipaque)
Comparison: CT lumbar 11/18/2013

CLINICAL DATA: Abdominal pain with vomiting.  Weakness.

EXAM:
CT ABDOMEN AND PELVIS WITH CONTRAST
TECHNIQUE: Multidetector CT imaging of the abdomen and pelvis was performed
using the standard protocol following bolus administration of
intravenous contrast.
CONTRAST:  100 mL Omnipaque 300 IV

[Series 2: routine abd pel with · axial · 0.65mm/px · z∈[-1160,-796]mm · 14 of 83 slices shown, 19 images]
[im 5/83  soft-tissue]
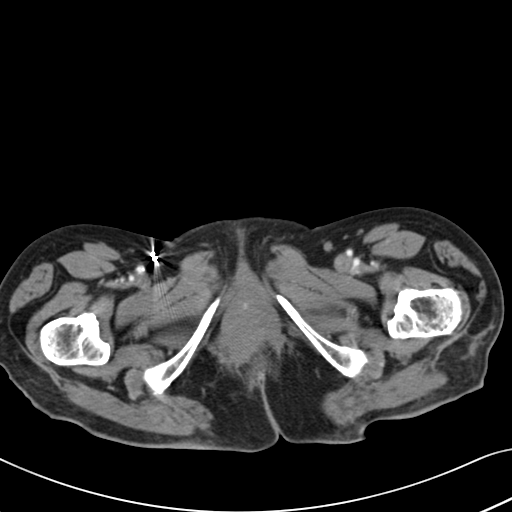
[im 5/83  bone]
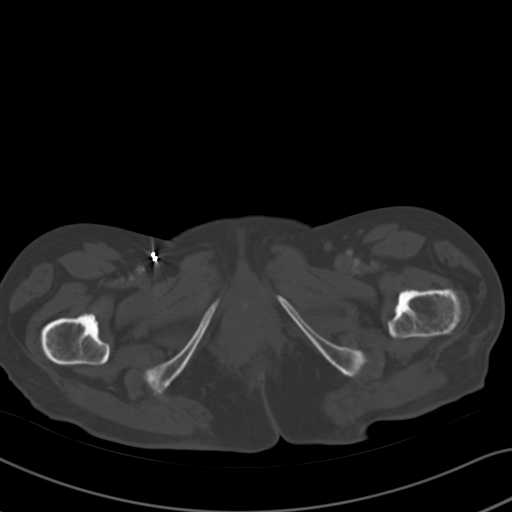
[im 13/83  soft-tissue]
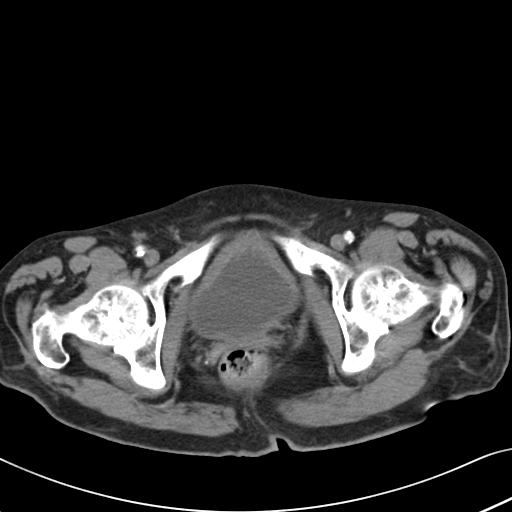
[im 18/83  soft-tissue]
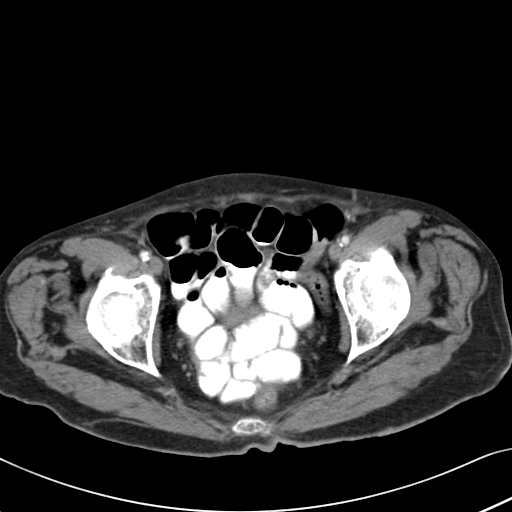
[im 22/83  soft-tissue]
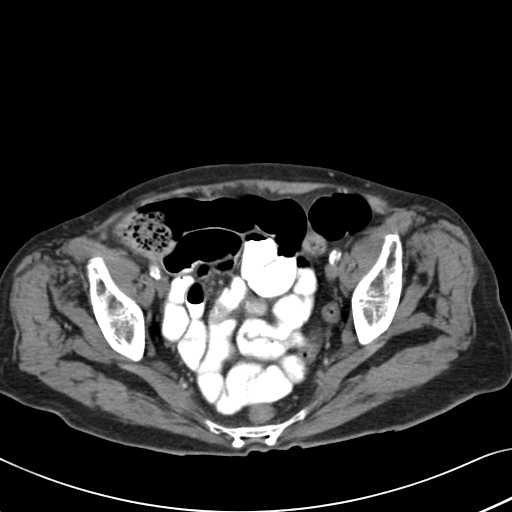
[im 31/83  soft-tissue]
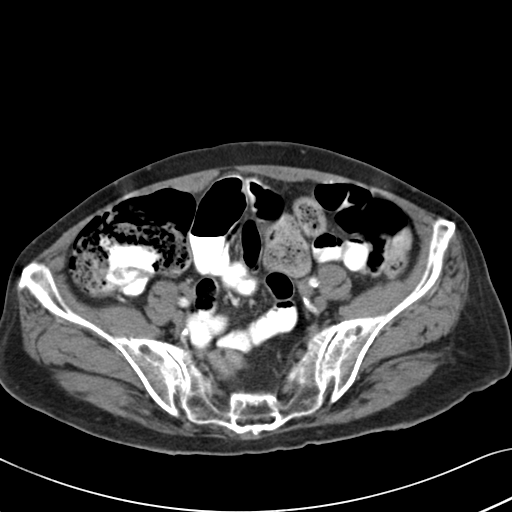
[im 35/83  soft-tissue]
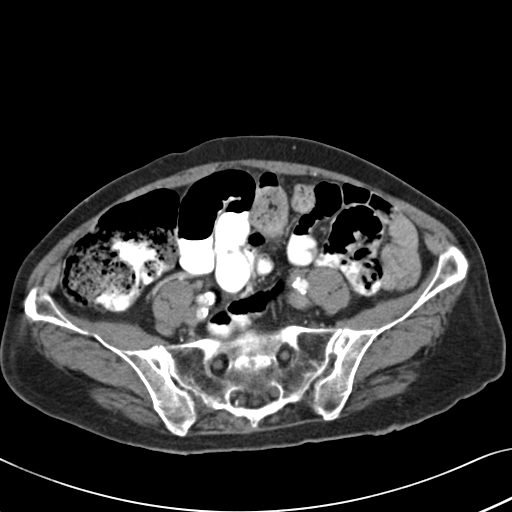
[im 44/83  soft-tissue]
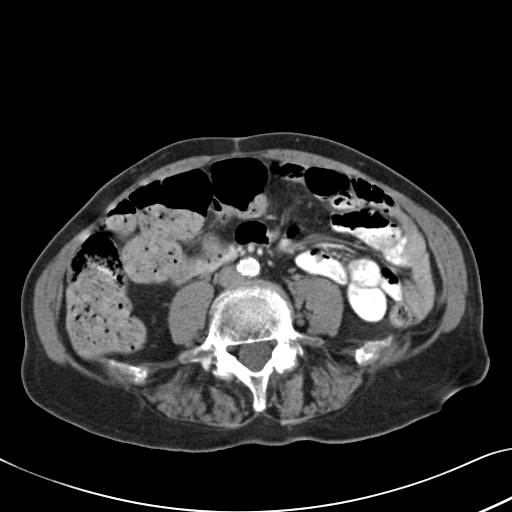
[im 48/83  soft-tissue]
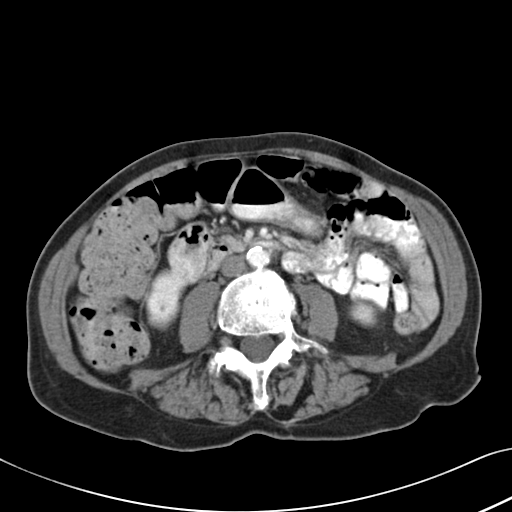
[im 52/83  soft-tissue]
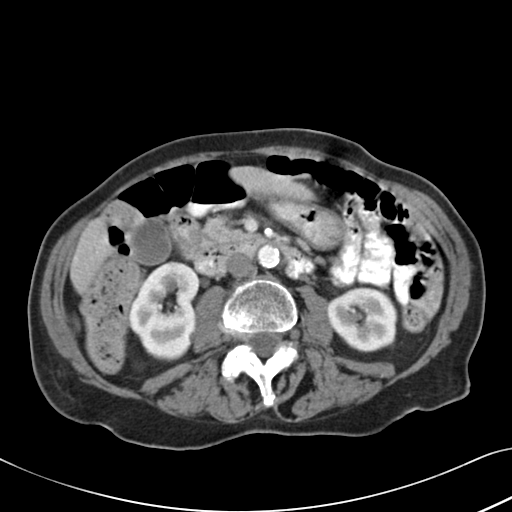
[im 52/83  bone]
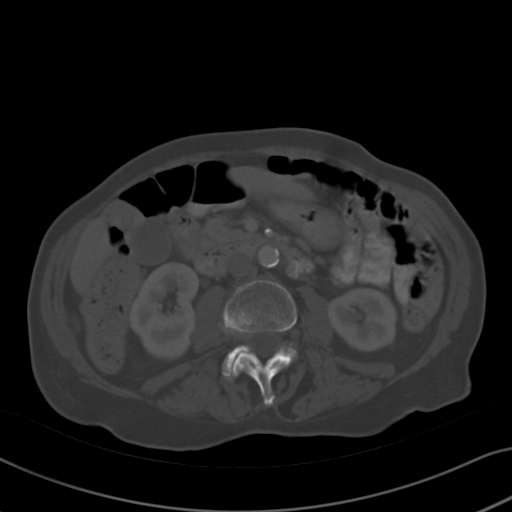
[im 61/83  soft-tissue]
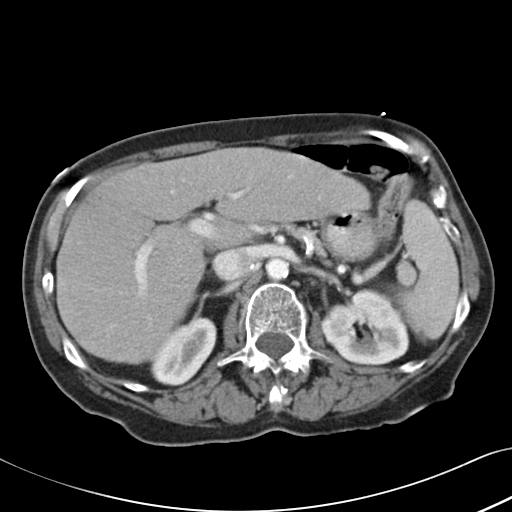
[im 65/83  soft-tissue]
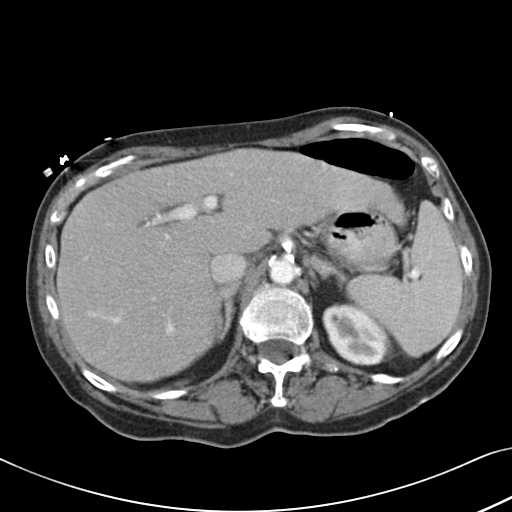
[im 65/83  lung]
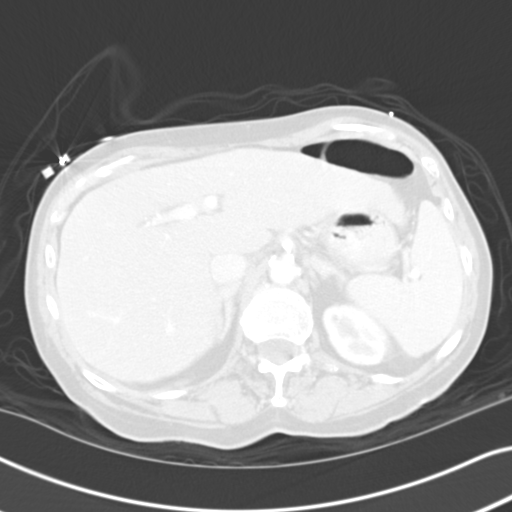
[im 70/83  soft-tissue]
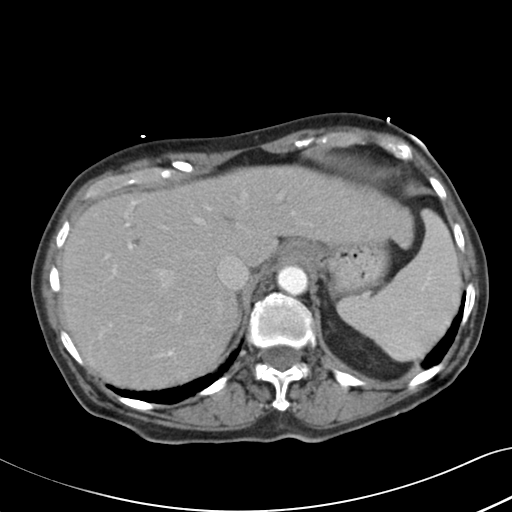
[im 70/83  lung]
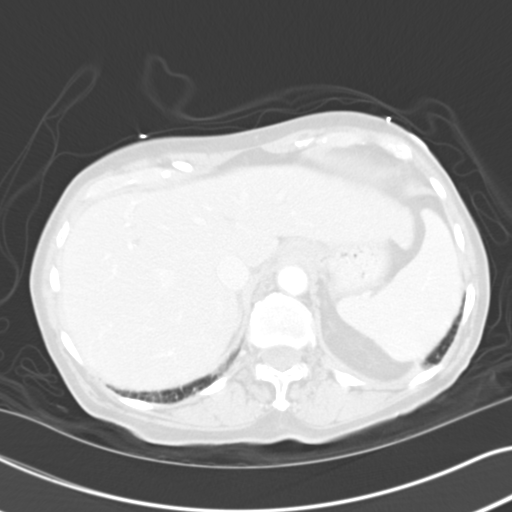
[im 74/83  lung]
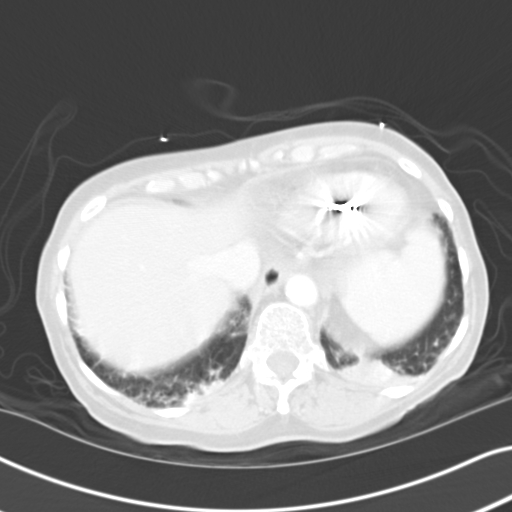
[im 78/83  soft-tissue]
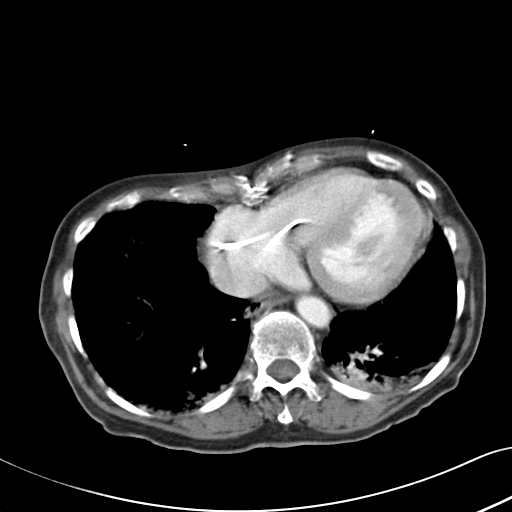
[im 78/83  lung]
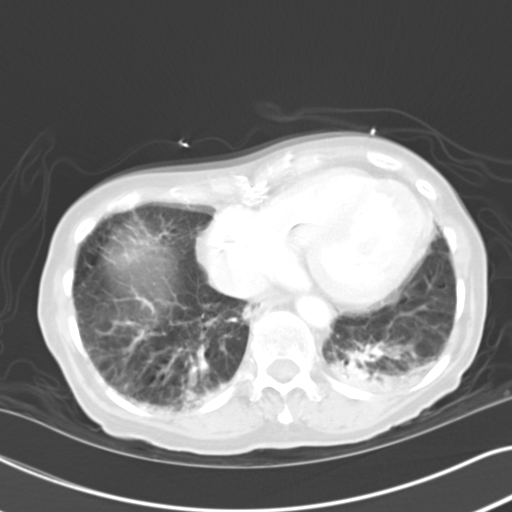

[14 of 32 positions shown; findings below may reference images not displayed]

FINDINGS: Mild bibasilar atelectasis.  No pleural effusion.

Cardiac enlargement with left ventricular enlargement. Dual lead
pacemaker noted.

Liver gallbladder and bile ducts are normal. Pancreas and spleen
normal. No pancreatic mass or edema

No renal mass or obstruction. Right lower pole renal cyst. Small
left renal cysts. Urinary bladder partially filled and appears
mildly thickened but without focal abnormality.

Negative for bowel obstruction or bowel thickening. Moderate
retained stool in the right and transverse colon.

Atherosclerotic aorta and iliac arteries without aneurysm.

Negative for mass or adenopathy.  No free fluid.

Moderate to severe fracture of L4 is similar in degree to the prior
lumbar CT. Retropulsion of superior endplate of L4 into the canal
also unchanged. No acute bony abnormality.
IMPRESSION: Moderate constipation.  Negative for bowel obstruction

Mild bibasilar atelectasis

Chronic compression fracture L4.

## 2016-09-06 LAB — GLUCOSE, CAPILLARY: Glucose-Capillary: 120 mg/dL — ABNORMAL HIGH (ref 65–99)

## 2016-09-06 IMAGING — CR DG CHEST 1V PORT
1 series · 1 of 1 positions shown · non-contrast
Comparison: 03/08/2015

CLINICAL DATA: Worsening weakness, vomiting, confusion. History of
atrial fibrillation, sepsis, COPD.

EXAM:
PORTABLE CHEST - 1 VIEW

[ap]
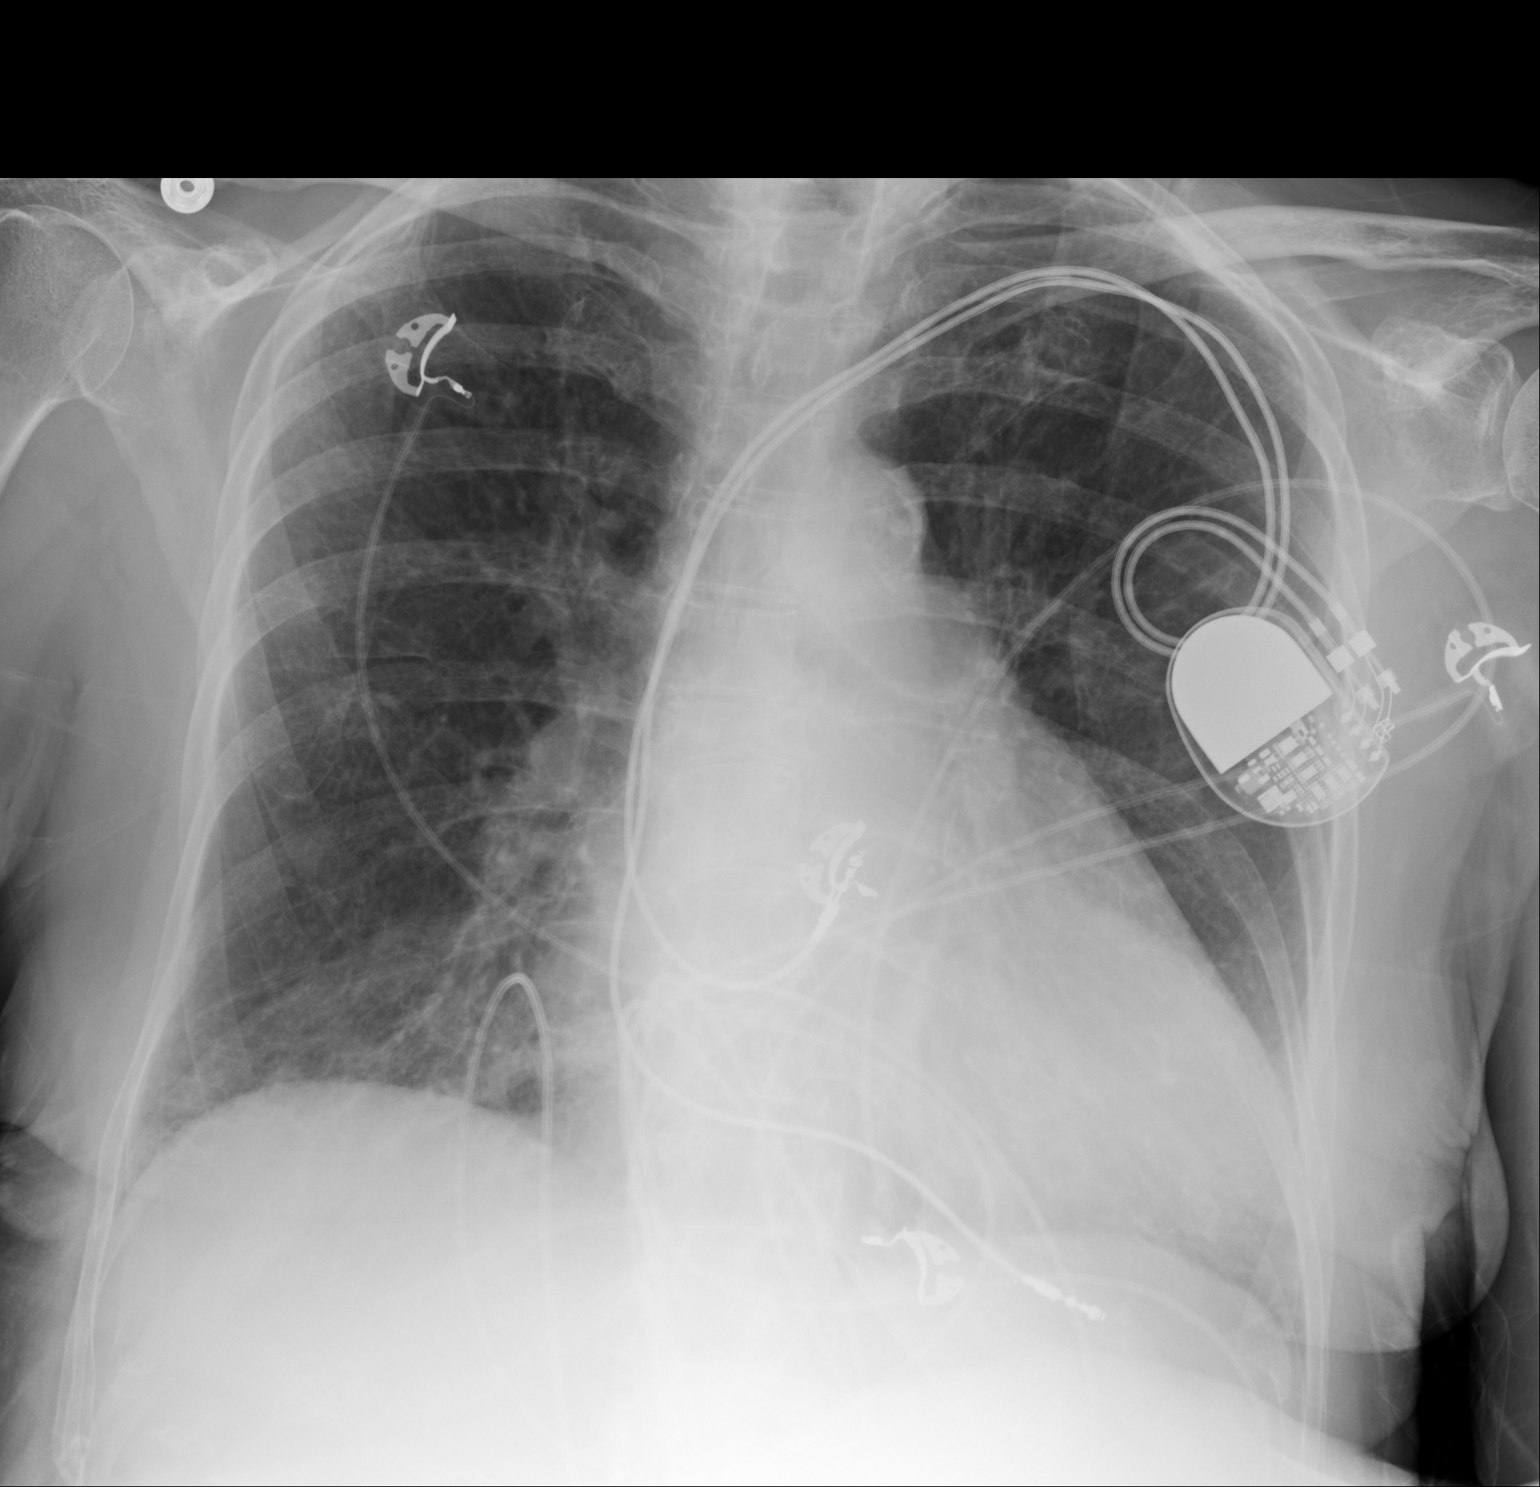

[1 of 1 positions shown; findings below may reference images not displayed]

FINDINGS: Left-sided pacemaker leads overlie the right atrium and right
ventricle. Heart is enlarged. Mild prominence of interstitial
markings at the bases appear stable. No focal consolidations or
pleural effusions.
IMPRESSION: Stable cardiomegaly.  No evidence for acute pulmonary abnormality.

## 2016-09-07 LAB — GLUCOSE, CAPILLARY: GLUCOSE-CAPILLARY: 111 mg/dL — AB (ref 65–99)

## 2016-09-08 LAB — GLUCOSE, CAPILLARY: GLUCOSE-CAPILLARY: 105 mg/dL — AB (ref 65–99)

## 2016-09-09 LAB — GLUCOSE, CAPILLARY: Glucose-Capillary: 113 mg/dL — ABNORMAL HIGH (ref 65–99)

## 2016-09-10 LAB — GLUCOSE, CAPILLARY: Glucose-Capillary: 111 mg/dL — ABNORMAL HIGH (ref 65–99)

## 2016-09-11 ENCOUNTER — Encounter
Admission: RE | Admit: 2016-09-11 | Discharge: 2016-09-11 | Disposition: A | Discharge status: Home / Self Care | Payer: Medicare Other | Source: Ambulatory Visit | Attending: Internal Medicine | Admitting: Internal Medicine

## 2016-09-12 LAB — GLUCOSE, CAPILLARY: GLUCOSE-CAPILLARY: 122 mg/dL — AB (ref 65–99)

## 2016-09-13 LAB — GLUCOSE, CAPILLARY: Glucose-Capillary: 103 mg/dL — ABNORMAL HIGH (ref 65–99)

## 2016-09-14 LAB — GLUCOSE, CAPILLARY: Glucose-Capillary: 109 mg/dL — ABNORMAL HIGH (ref 65–99)

## 2016-09-15 LAB — GLUCOSE, CAPILLARY: Glucose-Capillary: 101 mg/dL — ABNORMAL HIGH (ref 65–99)

## 2016-09-16 ENCOUNTER — Encounter (HOSPITAL_COMMUNITY): Payer: Self-pay | Admitting: *Deleted

## 2016-09-16 ENCOUNTER — Emergency Department (HOSPITAL_COMMUNITY): Payer: Medicare Other

## 2016-09-16 ENCOUNTER — Inpatient Hospital Stay (HOSPITAL_COMMUNITY)
Admission: EM | Admit: 2016-09-16 | Discharge: 2016-09-18 | DRG: 064 | Disposition: A | Discharge status: Skilled Nursing Facility | Payer: Medicare Other | Attending: Internal Medicine | Admitting: Internal Medicine

## 2016-09-16 DIAGNOSIS — Z66 Do not resuscitate: Secondary | ICD-10-CM | POA: Diagnosis present

## 2016-09-16 DIAGNOSIS — I69321 Dysphasia following cerebral infarction: Secondary | ICD-10-CM

## 2016-09-16 DIAGNOSIS — Z886 Allergy status to analgesic agent status: Secondary | ICD-10-CM

## 2016-09-16 DIAGNOSIS — I5042 Chronic combined systolic (congestive) and diastolic (congestive) heart failure: Secondary | ICD-10-CM | POA: Diagnosis present

## 2016-09-16 DIAGNOSIS — I63511 Cerebral infarction due to unspecified occlusion or stenosis of right middle cerebral artery: Secondary | ICD-10-CM | POA: Diagnosis not present

## 2016-09-16 DIAGNOSIS — R350 Frequency of micturition: Secondary | ICD-10-CM | POA: Diagnosis not present

## 2016-09-16 DIAGNOSIS — J449 Chronic obstructive pulmonary disease, unspecified: Secondary | ICD-10-CM | POA: Diagnosis present

## 2016-09-16 DIAGNOSIS — R4182 Altered mental status, unspecified: Secondary | ICD-10-CM | POA: Diagnosis not present

## 2016-09-16 DIAGNOSIS — R29810 Facial weakness: Secondary | ICD-10-CM | POA: Diagnosis present

## 2016-09-16 DIAGNOSIS — G9340 Encephalopathy, unspecified: Secondary | ICD-10-CM | POA: Diagnosis present

## 2016-09-16 DIAGNOSIS — F0391 Unspecified dementia with behavioral disturbance: Secondary | ICD-10-CM

## 2016-09-16 DIAGNOSIS — K219 Gastro-esophageal reflux disease without esophagitis: Secondary | ICD-10-CM | POA: Diagnosis present

## 2016-09-16 DIAGNOSIS — Z96651 Presence of right artificial knee joint: Secondary | ICD-10-CM | POA: Diagnosis present

## 2016-09-16 DIAGNOSIS — I63412 Cerebral infarction due to embolism of left middle cerebral artery: Secondary | ICD-10-CM | POA: Diagnosis present

## 2016-09-16 DIAGNOSIS — Z79899 Other long term (current) drug therapy: Secondary | ICD-10-CM | POA: Diagnosis not present

## 2016-09-16 DIAGNOSIS — I10 Essential (primary) hypertension: Secondary | ICD-10-CM | POA: Diagnosis present

## 2016-09-16 DIAGNOSIS — Z9181 History of falling: Secondary | ICD-10-CM

## 2016-09-16 DIAGNOSIS — I482 Chronic atrial fibrillation: Secondary | ICD-10-CM

## 2016-09-16 DIAGNOSIS — I110 Hypertensive heart disease with heart failure: Secondary | ICD-10-CM | POA: Diagnosis present

## 2016-09-16 DIAGNOSIS — E785 Hyperlipidemia, unspecified: Secondary | ICD-10-CM | POA: Diagnosis present

## 2016-09-16 DIAGNOSIS — E119 Type 2 diabetes mellitus without complications: Secondary | ICD-10-CM

## 2016-09-16 DIAGNOSIS — F0390 Unspecified dementia without behavioral disturbance: Secondary | ICD-10-CM | POA: Diagnosis present

## 2016-09-16 DIAGNOSIS — I63411 Cerebral infarction due to embolism of right middle cerebral artery: Secondary | ICD-10-CM | POA: Diagnosis not present

## 2016-09-16 DIAGNOSIS — I639 Cerebral infarction, unspecified: Secondary | ICD-10-CM

## 2016-09-16 DIAGNOSIS — Z950 Presence of cardiac pacemaker: Secondary | ICD-10-CM | POA: Diagnosis present

## 2016-09-16 DIAGNOSIS — Z7902 Long term (current) use of antithrombotics/antiplatelets: Secondary | ICD-10-CM

## 2016-09-16 DIAGNOSIS — G8194 Hemiplegia, unspecified affecting left nondominant side: Secondary | ICD-10-CM | POA: Diagnosis present

## 2016-09-16 DIAGNOSIS — R410 Disorientation, unspecified: Secondary | ICD-10-CM | POA: Diagnosis present

## 2016-09-16 DIAGNOSIS — Z9981 Dependence on supplemental oxygen: Secondary | ICD-10-CM | POA: Diagnosis not present

## 2016-09-16 DIAGNOSIS — Z87891 Personal history of nicotine dependence: Secondary | ICD-10-CM

## 2016-09-16 DIAGNOSIS — I422 Other hypertrophic cardiomyopathy: Secondary | ICD-10-CM | POA: Diagnosis present

## 2016-09-16 DIAGNOSIS — I4891 Unspecified atrial fibrillation: Secondary | ICD-10-CM | POA: Diagnosis present

## 2016-09-16 LAB — I-STAT CHEM 8, ED
BUN: 32 mg/dL — AB (ref 6–20)
CHLORIDE: 107 mmol/L (ref 101–111)
CREATININE: 1 mg/dL (ref 0.44–1.00)
Calcium, Ion: 1.12 mmol/L — ABNORMAL LOW (ref 1.15–1.40)
Glucose, Bld: 116 mg/dL — ABNORMAL HIGH (ref 65–99)
HEMATOCRIT: 43 % (ref 36.0–46.0)
Hemoglobin: 14.6 g/dL (ref 12.0–15.0)
POTASSIUM: 4.1 mmol/L (ref 3.5–5.1)
SODIUM: 142 mmol/L (ref 135–145)
TCO2: 24 mmol/L (ref 0–100)

## 2016-09-16 LAB — CBC
HCT: 44.2 % (ref 36.0–46.0)
HEMOGLOBIN: 15 g/dL (ref 12.0–15.0)
MCH: 33.3 pg (ref 26.0–34.0)
MCHC: 33.9 g/dL (ref 30.0–36.0)
MCV: 98 fL (ref 78.0–100.0)
PLATELETS: 194 10*3/uL (ref 150–400)
RBC: 4.51 MIL/uL (ref 3.87–5.11)
RDW: 12.8 % (ref 11.5–15.5)
WBC: 5.6 10*3/uL (ref 4.0–10.5)

## 2016-09-16 LAB — COMPREHENSIVE METABOLIC PANEL
ALBUMIN: 4.4 g/dL (ref 3.5–5.0)
ALT: 14 U/L (ref 14–54)
ANION GAP: 10 (ref 5–15)
AST: 29 U/L (ref 15–41)
Alkaline Phosphatase: 84 U/L (ref 38–126)
BUN: 26 mg/dL — AB (ref 6–20)
CHLORIDE: 105 mmol/L (ref 101–111)
CO2: 25 mmol/L (ref 22–32)
Calcium: 9.9 mg/dL (ref 8.9–10.3)
Creatinine, Ser: 1.15 mg/dL — ABNORMAL HIGH (ref 0.44–1.00)
GFR calc Af Amer: 51 mL/min — ABNORMAL LOW (ref >60)
GFR calc non Af Amer: 44 mL/min — ABNORMAL LOW (ref >60)
GLUCOSE: 126 mg/dL — AB (ref 65–99)
POTASSIUM: 4.1 mmol/L (ref 3.5–5.1)
SODIUM: 140 mmol/L (ref 135–145)
TOTAL PROTEIN: 8 g/dL (ref 6.5–8.1)
Total Bilirubin: 1 mg/dL (ref 0.3–1.2)

## 2016-09-16 LAB — DIFFERENTIAL
BASOS ABS: 0 10*3/uL (ref 0.0–0.1)
BASOS PCT: 0 %
EOS ABS: 0.2 10*3/uL (ref 0.0–0.7)
EOS PCT: 3 %
Lymphocytes Relative: 29 %
Lymphs Abs: 1.6 10*3/uL (ref 0.7–4.0)
Monocytes Absolute: 0.4 10*3/uL (ref 0.1–1.0)
Monocytes Relative: 8 %
NEUTROS PCT: 60 %
Neutro Abs: 3.4 10*3/uL (ref 1.7–7.7)

## 2016-09-16 LAB — GLUCOSE, CAPILLARY: GLUCOSE-CAPILLARY: 120 mg/dL — AB (ref 65–99)

## 2016-09-16 LAB — I-STAT TROPONIN, ED: Troponin i, poc: 0.02 ng/mL (ref 0.00–0.08)

## 2016-09-16 LAB — APTT: APTT: 30 s (ref 24–36)

## 2016-09-16 LAB — PROTIME-INR
INR: 1.05
PROTHROMBIN TIME: 13.7 s (ref 11.4–15.2)

## 2016-09-16 MED ORDER — DIVALPROEX SODIUM 125 MG PO CSDR
250.0000 mg | DELAYED_RELEASE_CAPSULE | Freq: Two times a day (BID) | ORAL | Status: DC
  Administered 2016-09-17 – 2016-09-18 (×3): 250 mg via ORAL
  Filled 2016-09-16 (×3): qty 2

## 2016-09-16 MED ORDER — CLOPIDOGREL BISULFATE 75 MG PO TABS
75.0000 mg | ORAL_TABLET | Freq: Every day | ORAL | Status: DC
  Administered 2016-09-17 – 2016-09-18 (×2): 75 mg via ORAL
  Filled 2016-09-16 (×2): qty 1

## 2016-09-16 MED ORDER — OXYBUTYNIN CHLORIDE ER 15 MG PO TB24
15.0000 mg | ORAL_TABLET | Freq: Every morning | ORAL | Status: DC
  Administered 2016-09-17 – 2016-09-18 (×2): 15 mg via ORAL
  Filled 2016-09-16 (×2): qty 1

## 2016-09-16 MED ORDER — ACETAMINOPHEN 650 MG RE SUPP: 650.0000 mg | RECTAL | Status: DC | PRN

## 2016-09-16 MED ORDER — INSULIN ASPART 100 UNIT/ML ~~LOC~~ SOLN
0.0000 [IU] | Freq: Three times a day (TID) | SUBCUTANEOUS | Status: DC
  Administered 2016-09-17 – 2016-09-18 (×2): 1 [IU] via SUBCUTANEOUS

## 2016-09-16 MED ORDER — DILTIAZEM HCL ER COATED BEADS 180 MG PO CP24
180.0000 mg | ORAL_CAPSULE | Freq: Every morning | ORAL | Status: DC
  Administered 2016-09-17 – 2016-09-18 (×2): 180 mg via ORAL
  Filled 2016-09-16 (×2): qty 1

## 2016-09-16 MED ORDER — STROKE: EARLY STAGES OF RECOVERY BOOK
Freq: Once | Status: AC
Start: 2016-09-16 — End: 2016-09-16
  Administered 2016-09-16
  Filled 2016-09-16: qty 1

## 2016-09-16 MED ORDER — ACETAMINOPHEN 325 MG PO TABS: 650.0000 mg | ORAL_TABLET | ORAL | Status: DC | PRN

## 2016-09-16 MED ORDER — IOPAMIDOL (ISOVUE-370) INJECTION 76%
INTRAVENOUS | Status: AC
  Filled 2016-09-16: qty 100

## 2016-09-16 NOTE — ED Triage Notes (Signed)
[facility]. Pt lives at an assisted living facility. Staff was checking pt's bp at 1900 noticed bp to be 230/110. At 1930, bp was 194/109. Per EMS, pt had a R sided facial droop and R sided gaze, fixed pupil and garbled speech. Pt is on plavix for afib. LSN 1600

## 2016-09-16 NOTE — ED Notes (Addendum)
Daughter, [name] [phone]  Daughter states pt has her upper and lower dentures in at this time.  Glasses were left at the facility

## 2016-09-16 NOTE — ED Provider Notes (Signed)
[facility]-EMER[facility]ENCY DEPT Provider Note   CSN: [id number] Arrival date & time: 09/16/16  2047     History   Chief Complaint Chief Complaint  Patient presents with  . Code Stroke    HPI [name] [name] is a 80 y.o. female.  She was transferred here from an assisted living facility as a code stroke. She was last seen normal at 1600. At 1900, she had a blood pressure check at which time she was noted to have facial droop and right-sided leg gaze preference and speech was more garbled than normal. She was noted to be hypertensive with blood pressure 230/110, and 194/109. She does have history of prior strokes. Family is here and states that her speech is worse than normal.   The history is provided by the nursing home, the EMS personnel and a relative. The history is limited by the condition of the patient (Dementia).    Past Medical History:  Diagnosis Date  . Anemia    "as a child" (08/05/2013)  . Anxiety   . Arthritis    "all over me; in all my joints" (08/05/2013)  . Atrial fibrillation (HCC)    a. Chronic; No longer on coumadin 2/2 frequent falls.  . Chest pain    a. 08/2011 Myoview: sm, fixed anteroseptal defect (attenuation), no ischemia, EF 62%.  . CHF (congestive heart failure) (HCC)   . Coccyx pain   . COPD (chronic obstructive pulmonary disease) (HCC)   . Dementia   . Depression   . Falls frequently    coumadin stopped  . GERD (gastroesophageal reflux disease)   . Gout    "right foot" (08/05/2013)  . H/O hiatal hernia   . History of pneumonia    "couple times; long time ago" (08/05/2013)  . Hypercholesterolemia   . Hypertension   . Hypertrophic cardiomyopathy (HCC)    a. 11/2013 Echo: EF 55-60%, basal inf HK, sev dil LA, no evidence of HCM.  PASP .  [name] Hyponatremia   . Hyposmolality   . Osteoarthritis   . Oxygen dependent    "3L 24/7" (08/05/2013)  . Pacemaker    a. 04/2012 MDT [id number] [employer] PPM, ser #: [id number] H.  . Pulmonary HTN    a. has had prior right  heart cath in 2010; was felt that most likely due to elevated left sided pressures and would not benefit from vasodilator therapy;  b. 11/2013 Echo: PASP .  . Situational mixed anxiety and depressive disorder   . TIA (transient ischemic attack)   . Type II diabetes mellitus (HCC)   . Varicose veins     Patient Active Problem List   Diagnosis Date Noted  . Paroxysmal atrial fibrillation (HCC)   . Encounter for palliative care   . Goals of care, counseling/discussion   . Altered mental state   . Cerebrovascular accident (CVA) due to embolism of left middle cerebral artery (HCC)   . Dementia   . Facial asymmetry 02/01/2016  . Altered mental status 02/01/2016  . Closed head injury 05/30/2014  . Pulmonary hypertension, moderate to severe 05/30/2014  . DM (diabetes mellitus), type 2 with neurological complications (HCC) 05/30/2014  . Hypoglycemia 05/29/2014  . Hypokalemia 05/29/2014  . Subdural hematoma caused by concussion (HCC) 05/28/2014  . hemorrhagic cerebral contusion 05/28/2014  . Dyslipidemia 03/07/2014  . Chronic respiratory failure (HCC) 01/01/2014  . Chest pain   . Compression fracture of L4 lumbar vertebra (HCC) 11/18/2013  . Syncope 11/18/2013  . Protein-calorie malnutrition, severe (HCC) 11/18/2013  .  Syncope and collapse 11/17/2013  . Closed compression fracture of L4 lumbar vertebra (HCC) 11/17/2013  . Gait difficulty 10/05/2013  . Fall (on)(from) incline, initial encounter 10/03/2013  . Essential hypertension, benign 10/03/2013  . Unsteady gait 10/03/2013  . Peripheral neuropathy (HCC) 10/03/2013  . Back pain 08/06/2013  . Near syncope 08/06/2013  . Pulmonary hypertension 08/05/2013  . Abnormality of gait 05/24/2013  . Acute respiratory distress 05/08/2013  . Pulmonary edema, acute (HCC) 05/08/2013  . Benign hypertensive heart disease without heart failure   . Pulmonary HTN   . Oxygen dependent   . COPD (chronic obstructive pulmonary disease) (HCC)   .  Fatigue due to cardiopulmonary and deconditioning 04/23/2013  . Memory deficit 02/07/2013  . Memory change 12/11/2012  . TIA (transient ischemic attack) 02/19/2012  . Chest pain at rest 08/05/2011  . Type II or unspecified type diabetes mellitus without mention of complication, uncontrolled 06/12/2011  . Fall 03/22/2011  . Mitral regurgitation 03/13/2011  . Hypertrophic cardiomyopathy (HCC)   . Osteoarthritis   . Situational mixed anxiety and depressive disorder   . Pacemaker 02/26/2011  . Atrial fibrillation (HCC) 01/28/2011  . Essential hypertension 05/31/2009  . ALLERGIC RHINITIS 05/31/2009    Past Surgical History:  Procedure Laterality Date  . ABDOMINAL HYSTERECTOMY     "partial" (08/05/2013)  . APPENDECTOMY    . BACK SURGERY     "bone spur removed; mid back" (08/05/2013)  . CARDIAC CATHETERIZATION     "more than once" (08/05/2013)  . DILATION AND CURETTAGE OF UTERUS     "several" (08/05/2013)  . FOOT NEUROMA SURGERY Right   . INSERT / REPLACE / REMOVE PACEMAKER  1998; 2000's; 2014  . PACEMAKER GENERATOR CHANGE N/A 04/24/2012   Procedure: PACEMAKER GENERATOR CHANGE;  Surgeon: Duke Salvia, MD;  Location: [facility] CATH LAB;  Service: Cardiovascular;  Laterality: N/A;  . TONSILLECTOMY    . TOTAL KNEE ARTHROPLASTY Right 2011    OB History    No data available       Home Medications    Prior to Admission medications   Medication Sig Start Date End Date Taking? Authorizing Provider  acetaminophen (TYLENOL) 325 MG tablet Take 650 mg by mouth 4 (four) times daily.     Historical Provider, MD  ALPRAZolam Prudy Feeler) 0.25 MG tablet Take 0.25 mg by mouth 2 (two) times daily.    Historical Provider, MD  benzocaine-menthol (CHLORASEPTIC) 6-10 MG lozenge Take 1 lozenge by mouth every 2 (two) hours as needed for sore throat.     Historical Provider, MD  clopidogrel (PLAVIX) 75 MG tablet Take 1 tablet (75 mg total) by mouth daily. 02/05/16   Richarda Overlie, MD  Dextromethorphan-Guaifenesin  (DIABETIC TUSSIN MAX ST) 10-200 MG/5ML LIQD Take 10 mLs by mouth every 6 (six) hours as needed (for cough).    Historical Provider, MD  diltiazem (TIAZAC) 180 MG 24 hr capsule Take 180 mg by mouth daily.    Historical Provider, MD  divalproex (DEPAKOTE SPRINKLE) 125 MG capsule Take 250 mg by mouth 2 (two) times daily.    Historical Provider, MD  haloperidol (HALDOL) 0.5 MG tablet Take 0.5 mg by mouth every 8 (eight) hours as needed for agitation.    Historical Provider, MD  mirtazapine (REMERON SOL-TAB) 15 MG disintegrating tablet Take 15 mg by mouth at bedtime.    Historical Provider, MD  morphine (ROXANOL) 20 MG/ML concentrated solution Take 0.5 mLs (10 mg total) by mouth every 4 (four) hours as needed for severe pain. 02/05/16  Richarda Overlie, MD  oxybutynin (DITROPAN XL) 15 MG 24 hr tablet Take 15 mg by mouth every morning.     Historical Provider, MD  senna (SENOKOT) 8.6 MG TABS tablet Take 1 tablet by mouth 2 (two) times daily.    Historical Provider, MD    Family History Family History  Problem Relation Age of Onset  . Other Father     died in plane crash  . Cancer Mother     lymphoma-NHL  . Coronary artery disease Grandchild   . Heart disease Maternal Grandmother     Social History Social History  Substance Use Topics  . Smoking status: Former Smoker    Packs/day: 1.00    Years: 40.00    Types: Cigarettes    Quit date: 11/15/2005  . Smokeless tobacco: Never Used  . Alcohol use No     Allergies   Aspirin; Ativan [lorazepam]; Calan [verapamil hcl]; Codeine; Crestor [rosuvastatin calcium]; Escitalopram oxalate; Lipitor [atorvastatin calcium]; Lopressor [metoprolol tartrate]; Nitroglycerin; Norvasc [amlodipine besylate]; Nsaids; Oxycodone; Percocet [oxycodone-acetaminophen]; Prednisone; Sulfa antibiotics; Vimovo [naproxen-esomeprazole]; and Penicillins   Review of Systems Review of Systems  Unable to perform ROS: Dementia     Physical Exam Updated Vital Signs BP (!)  141/54   Pulse 93   Temp 97.2 F (36.2 C) (Oral)   Resp 18   SpO2 99%   Physical Exam  Nursing note and vitals reviewed.  80 year old female, resting comfortably and in no acute distress. Vital signs are significant for hypertension. Oxygen saturation is 99%, which is normal. Head is normocephalic and atraumatic. She has preferential gaze to the right and will not cross the midline to the left. Oropharynx is clear. Neck is nontender and supple without adenopathy or JVD. There are no carotid bruits. Back is nontender and there is no CVA tenderness. Lungs are clear without rales, wheezes, or rhonchi. Chest is nontender. Heart has regular rate and rhythm without murmur. Abdomen is soft, flat, nontender without masses or hepatosplenomegaly and peristalsis is normoactive. Extremities have no cyanosis or edema, full range of motion is present. Skin is warm and dry without rash. Neurologic: She is awake and oriented to person, partly oriented place, not oriented to time.Speech is slightly dysarthric. Cranial nerves are significant for gaze preference noted above, and mild left central facial droop. She moves all extremities equally. She'll not cooperate for pronator drift testing.  ED Treatments / Results  Labs (all labs ordered are listed, but only abnormal results are displayed) Labs Reviewed  COMPREHENSIVE METABOLIC PANEL - Abnormal; Notable for the following:       Result Value   Glucose, Bld 126 (*)    BUN 26 (*)    Creatinine, Ser 1.15 (*)    GFR calc non Af Amer 44 (*)    GFR calc Af Amer 51 (*)    All other components within normal limits  I-STAT CHEM 8, ED - Abnormal; Notable for the following:    BUN 32 (*)    Glucose, Bld 116 (*)    Calcium, Ion 1.12 (*)    All other components within normal limits  PROTIME-INR  APTT  CBC  DIFFERENTIAL  I-STAT TROPOININ, ED  CBG MONITORING, ED    EKG  EKG Interpretation  Date/Time:  Monday September 16 2016 21:37:56  EST Ventricular Rate:  85 PR Interval:    QRS Duration: 105 QT Interval:  387 QTC Calculation: 461 R Axis:   68 Text Interpretation:  Atrial fibrillation Ventricular premature complex  LVH with secondary repolarization abnormality ST depression, consider ischemia, diffuse lds When compared with ECG of 05/16/2016, Sinus rhythm has replaced VENTRICULAR PACED RHYTHM When compared with ECG of 10/04/2015, ST-t abnormality is unchanged Confirmed by [facility]  MD, Toniqua Melamed ([id number]) on 09/16/2016 10:09:34 PM       Radiology Ct Head Code Stroke W/o Cm  Result Date: 09/16/2016 CLINICAL DATA:  Code stroke.  Patient not moving her left side. EXAM: CT HEAD WITHOUT CONTRAST TECHNIQUE: Contiguous axial images were obtained from the base of the skull through the vertex without intravenous contrast. COMPARISON:  06/05/2016 CT head. FINDINGS: Brain: No evidence of large acute territory infarct, focal mass effect, or intracranial hemorrhage. Stable chronic left parietal infarct. Stable right putamen, right caudate head, and right anterior insula chronic lacunar infarcts. Stable small chronic infarct within the right cerebellar hemisphere. Stable background of exam chronic microvascular ischemic changes and moderate parenchymal volume loss. No extra-axial collection identified. Nonspecific right parietal cortical calcifications are unchanged. Vascular: Calcific atherosclerosis of the cavernous internal carotid arteries and vertebral arteries. Skull: Normal. Negative for fracture or focal lesion. Sinuses/Orbits: Right frontal sinus mucous retention cyst. Otherwise the visualized paranasal sinuses and mastoids are clear. Other: None. ASPECTS [facility]([place] Stroke Program Early CT Score) - Ganglionic level infarction (caudate, lentiform nuclei, internal capsule, insula, M1-M3 cortex): 7 - Supraganglionic infarction (M4-M6 cortex): 3 Total score (0-10 with 10 being normal): 10 IMPRESSION: 1. No evidence of large acute infarct, mass effect, or  intracranial hemorrhage and symptoms persist or if clinically indicated MRI is more sensitive for acute stroke. 2. ASPECTS is 10 3. Stable background of small chronic infarcts, advanced chronic microvascular ischemic changes, and moderate parenchymal volume loss. These results were called by telephone at the time of interpretation on 09/16/2016 at 9:16 pm to Dr. Chaney MallingAVID YAO , who verbally acknowledged these results. Electronically Signed   By: Mitzi HansenLance  Furusawa-Stratton M.D.   On: 09/16/2016 21:16    Procedures Procedures (including critical care time) CRITICAL CARE Performed by: UEAVW,UJWJXGLICK,Lanae Federer Total critical care time: 45 minutes Critical care time was exclusive of separately billable procedures and treating other patients. Critical care was necessary to treat or prevent imminent or life-threatening deterioration. Critical care was time spent personally by me on the following activities: development of treatment plan with patient and/or surrogate as well as nursing, discussions with consultants, evaluation of patient's response to treatment, examination of patient, obtaining history from patient or surrogate, ordering and performing treatments and interventions, ordering and review of laboratory studies, ordering and review of radiographic studies, pulse oximetry and re-evaluation of patient's condition.  Medications Ordered in ED Medications  iopamidol (ISOVUE-370) 76 % injection (not administered)     Initial Impression / Assessment and Plan / ED Course  I have reviewed the triage vital signs and the nursing notes.  Pertinent labs & imaging results that were available during my care of the patient were reviewed by me and considered in my medical decision making (see chart for details).  Clinical Course    Stroke with findings of facial droop, right-sided gaze preference, dysarthric speech. Old records are reviewed confirming prior stroke and history of atrial fibrillation. She has a pacemaker, so  is not a candidate for MRI. She has not been a candidate for anticoagulation because of falls. She is not a candidate for thrombolytic therapy or endovascular procedure based on exam and time from last known normal. Patient was seen in conjunction with Dr. Otelia LimesLindzen of neurology service. Case is discussed with Dr. Montez Moritaarter of  triad hospitalists who agrees to admit the patient.  Final Clinical Impressions(s) / ED Diagnoses   Final diagnoses:  Stroke (cerebrum) [facility]HCC)    New Prescriptions New Prescriptions   No medications on file     Dione Boozeavid Katharine Rochefort, MD 09/16/16 2210

## 2016-09-16 NOTE — ED Notes (Signed)
Delay in lab draw,  Pt not in room 

## 2016-09-16 NOTE — ED Notes (Signed)
Pt presents to the ED as a Code Stroke, LSN 1600. Pt lives in skilled nursing and was having vital signs checked at 1900, staff noted pt to be hypertension with R sided facial droop, R sided gaze, and garbled speech. Per daughter, pt has slurred speech from previous CVA one year ago. L pupil is fixed, R pupil is reactive. Pt has a hx of dementia and intermittently has difficulty with president and date/time. Initial NIH was 12. Pt passed swallow screen.

## 2016-09-16 NOTE — ED Notes (Signed)
Per daughter at bedside, [facility] last stroke. At baseline, pt has slurred speech and is unable to recall certain events ( such as the president, date/time). Pt is alert and oriented x 3 at present.

## 2016-09-16 NOTE — H&P (Signed)
History and Physical    DAMA HEDGEPETH [name]:[id number] DOB: 1935/12/12 DOA: 09/16/2016  PCP: Patient is a SNF resident.    Patient coming from: SNF  Chief Complaint: Elevated blood pressure, acute mental status changes with right gaze preference and facial droop  HPI: [name] [name] is a 80 y.o. woman with a history of dementia, chronic atrial fibrillation (CHADS-Vasc score of at least 5, no longer anticoagulated due to recurrent falls and bleeding risk), permanent pacemaker implant, HTN, DM, CHF, COPD, and prior CVA one year ago who was referred to the ED via EMS for evaluation of acute mental status changes, facial droop with right gaze preference, and garbled speech in the setting of elevated blood pressures (systolic greater than 200, diastolic greater than 100).  CODE Stroke activated.  The patient was last known well around 4PM.  It sounds like she was having a routine assessment at shift change, when her neurological deficits and abnormal blood pressures were discovered.  Her daughter at bedside reports that the patient seemed to be at her baseline when they visited yesterday; however her sister thinks the patient was showing signs that something was wrong on Saturday.  No recent antibiotics or hospitalizations in the past 30 days.  At baseline, the patient is only oriented to self and place.  She recognizes family but does not know the date/time.  She is usually in a wheelchair due to her history of falls.  ED Course: STAT head CT without contrast did not show acute stroke.  The patient was evaluated by neurology.  She cannot have an MRI because of PPM, so CTA head/neck and CT perfusion studies were ordered.  Hospitalist asked to admit.  Review of Systems: As per HPI otherwise 10 point review of systems negative.    Past Medical History:  Diagnosis Date  . Anemia    "as a child" (08/05/2013)  . Anxiety   . Arthritis    "all over me; in all my joints" (08/05/2013)  . Atrial fibrillation (HCC)      a. Chronic; No longer on coumadin 2/2 frequent falls.  . Chest pain    a. 08/2011 Myoview: sm, fixed anteroseptal defect (attenuation), no ischemia, EF 62%.  . CHF (congestive heart failure) (HCC)   . Coccyx pain   . COPD (chronic obstructive pulmonary disease) (HCC)   . Dementia   . Depression   . Falls frequently    coumadin stopped  . GERD (gastroesophageal reflux disease)   . Gout    "right foot" (08/05/2013)  . H/O hiatal hernia   . History of pneumonia    "couple times; long time ago" (08/05/2013)  . Hypercholesterolemia   . Hypertension   . Hypertrophic cardiomyopathy (HCC)    a. 11/2013 Echo: EF 55-60%, basal inf HK, sev dil LA, no evidence of HCM.  PASP .  [name] Hyponatremia   . Hyposmolality   . Osteoarthritis   . Oxygen dependent    "3L 24/7" (08/05/2013)  . Pacemaker    a. 04/2012 MDT [id number] [employer] PPM, ser #: [id number] H.  . Pulmonary HTN    a. has had prior right heart cath in 2010; was felt that most likely due to elevated left sided pressures and would not benefit from vasodilator therapy;  b. 11/2013 Echo: PASP .  . Situational mixed anxiety and depressive disorder   . TIA (transient ischemic attack)   . Type II diabetes mellitus (HCC)   . Varicose veins     Past Surgical  History:  Procedure Laterality Date  . ABDOMINAL HYSTERECTOMY     "partial" (08/05/2013)  . APPENDECTOMY    . BACK SURGERY     "bone spur removed; mid back" (08/05/2013)  . CARDIAC CATHETERIZATION     "more than once" (08/05/2013)  . DILATION AND CURETTAGE OF UTERUS     "several" (08/05/2013)  . FOOT NEUROMA SURGERY Right   . INSERT / REPLACE / REMOVE PACEMAKER  1998; 2000's; 2014  . PACEMAKER GENERATOR CHANGE N/A 04/24/2012   Procedure: PACEMAKER GENERATOR CHANGE;  Surgeon: Duke Salvia, MD;  Location: [facility] CATH LAB;  Service: Cardiovascular;  Laterality: N/A;  . TONSILLECTOMY    . TOTAL KNEE ARTHROPLASTY Right 2011     reports that she quit smoking about 10 years ago. Her  smoking use included Cigarettes. She has a 40.00 pack-year smoking history. She has never used smokeless tobacco. She reports that she does not drink alcohol or use drugs. She is a widow.  She has two adult daughters.  Allergies  Allergen Reactions  . Aspirin Nausea And Vomiting  . Ativan [Lorazepam] Other (See Comments)    Hallucinations  . Calan [Verapamil Hcl] Other (See Comments)    Patient states it makes her "out of her mind"; bp bottoms out (takes diltiazem at home)  . Codeine Nausea And Vomiting  . Crestor [Rosuvastatin Calcium] Other (See Comments)    Muscle pain  . Escitalopram Oxalate Other (See Comments)    Reaction Unknown   . Lactose Intolerance (Gi) Diarrhea    Can tolerate in small amounts  . Lexapro [Escitalopram] Other (See Comments)    Unknown   . Lipitor [Atorvastatin Calcium] Other (See Comments)    myalgia  . Lopressor [Metoprolol Tartrate] Other (See Comments)    Blood pressure bottoms out   . Metoprolol Tartrate Other (See Comments)    Unknown   . Nitroglycerin Other (See Comments)    Reaction unknown  . Norvasc [Amlodipine Besylate] Other (See Comments)    fatigue  . Nsaids Nausea Only  . Oxycodone Other (See Comments)    Reaction unknown  . Percocet [Oxycodone-Acetaminophen] Nausea And Vomiting  . Prednisone Itching and Swelling  . Sulfa Antibiotics Nausea Only and Other (See Comments)    Makes her stomach hurt  . Tape Other (See Comments)    Skin tears & bruises easily (SKIN IS THIN!!)  . Vimovo [Naproxen-Esomeprazole] Itching, Nausea Only and Swelling  . Zoloft [Sertraline] Other (See Comments)    UNKNOWN   . Penicillins Hives and Rash    Has patient had a PCN reaction causing immediate rash, facial/tongue/throat swelling, SOB or lightheadedness with hypotension: Yes Has patient had a PCN reaction causing severe rash involving mucus membranes or skin necrosis: No Has patient had a PCN reaction that required hospitalization No Has patient  had a PCN reaction occurring within the last 10 years: No If all of the above answers are "NO", then may proceed with Cephalosporin use.     Family History  Problem Relation Age of Onset  . Other Father     died in plane crash  . Cancer Mother     lymphoma-NHL  . Coronary artery disease Grandchild   . Heart disease Maternal Grandmother      Prior to Admission medications   Medication Sig Start Date End Date Taking? Authorizing Provider  acetaminophen (TYLENOL) 325 MG tablet Take 650 mg by mouth 4 (four) times daily.    Yes Historical Provider, MD  ALPRAZolam Prudy Feeler) 0.25 MG  tablet Take 0.25 mg by mouth 2 (two) times daily.   Yes Historical Provider, MD  benzocaine-menthol (CHLORASEPTIC) 6-10 MG lozenge Take 1 lozenge by mouth every 2 (two) hours as needed for sore throat.    Yes Historical Provider, MD  clopidogrel (PLAVIX) 75 MG tablet Take 1 tablet (75 mg total) by mouth daily. 02/05/16  Yes Richarda Overlie, MD  dextromethorphan-guaiFENesin (DIABETIC TUSSIN DM) 10-100 MG/5ML liquid Take 10 mLs by mouth every 6 (six) hours as needed for cough.   Yes Historical Provider, MD  diltiazem (TAZTIA XT) 180 MG 24 hr capsule Take 180 mg by mouth every morning.   Yes Historical Provider, MD  divalproex (DEPAKOTE SPRINKLE) 125 MG capsule Take 250 mg by mouth 2 (two) times daily.   Yes Historical Provider, MD  mirtazapine (REMERON) 15 MG tablet Take 15 mg by mouth at bedtime.   Yes Historical Provider, MD  morphine (ROXANOL) 20 MG/ML concentrated solution Take 0.5 mLs (10 mg total) by mouth every 4 (four) hours as needed for severe pain. 02/05/16  Yes Richarda Overlie, MD  OVER THE COUNTER MEDICATION "End It Ointment": Apply to irritated areas daily (as needed)   Yes Historical Provider, MD  oxybutynin (DITROPAN XL) 15 MG 24 hr tablet Take 15 mg by mouth every morning.    Yes Historical Provider, MD  senna (SENOKOT) 8.6 MG TABS tablet Take 1 tablet by mouth 2 (two) times daily.   Yes Historical Provider, MD   haloperidol (HALDOL) 0.5 MG tablet Take 0.5 mg by mouth every 8 (eight) hours as needed for agitation.    Historical Provider, MD    Physical Exam: Vitals:   09/16/16 2145 09/16/16 2145 09/16/16 2200 09/16/16 2243  BP: 163/100  184/96   Pulse: 85  84   Resp: 23  20   Temp:  97.2 F (36.2 C)  97.2 F (36.2 C)  TempSrc:  Oral    SpO2: 97%  98%       Constitutional: NAD, calm, dioriented Vitals:   09/16/16 2145 09/16/16 2145 09/16/16 2200 09/16/16 2243  BP: 163/100  184/96   Pulse: 85  84   Resp: 23  20   Temp:  97.2 F (36.2 C)  97.2 F (36.2 C)  TempSrc:  Oral    SpO2: 97%  98%    Eyes: PERRL, lids and conjunctivae normal ENMT: Mucous membranes are moist. Patient unable to cooperate with complete exam due to mental status changes. Neck: normal appearance, supple Respiratory: clear to auscultation bilaterally, no wheezing, no crackles. Normal respiratory effort. No accessory muscle use.  Cardiovascular: Normal rate, regular rhythm (paced).  No murmurs / rubs / gallops. No extremity edema. 2+ pedal pulses.  GI: abdomen is soft and compressible.  No distention.  No apparent tenderness.  Bowel sounds are present. Musculoskeletal:  No joint deformity in upper and lower extremities. No contractures. Normal muscle tone. Moves all four extremities spontaneously, though not necessarily on command. Skin: no rashes, pale, dry Neurologic: Limited by mental status changes but she certainly has right gaze preference with both pupils deviated to the right.  However, both pupils are equal and reactive.  Tongue appears midline.  Speech is still slurred.  She has left sided weakness but still has control of gross motor movements. Psychiatric: Awake and answers yes/no questions.  However, she is disoriented with impaired judgement/insight.    Labs on Admission: I have personally reviewed following labs and imaging studies  CBC:  Recent Labs Lab 09/16/16 2056 09/16/16 2103  WBC  --   5.6  NEUTROABS  --  3.4  HGB 14.6 15.0  HCT 43.0 44.2  MCV  --  98.0  PLT  --  194   Basic Metabolic Panel:  Recent Labs Lab 09/16/16 2056 09/16/16 2103  NA 142 140  K 4.1 4.1  CL 107 105  CO2  --  25  GLUCOSE 116* 126*  BUN 32* 26*  CREATININE 1.00 1.15*  CALCIUM  --  9.9   GFR: CrCl cannot be calculated (Unknown ideal weight.). Liver Function Tests:  Recent Labs Lab 09/16/16 2103  AST 29  ALT 14  ALKPHOS 84  BILITOT 1.0  PROT 8.0  ALBUMIN 4.4   Coagulation Profile:  Recent Labs Lab 09/16/16 2103  INR 1.05   CBG:  Recent Labs Lab 09/12/16 0544 09/13/16 0623 09/14/16 0651 09/15/16 0628 09/16/16 0626  GLUCAP 122* 103* 109* 101* 120*   Radiological Exams on Admission: Ct Angio Head W Or Wo Contrast  Result Date: 09/16/2016 CLINICAL DATA:  80 y/o F; left-sided weakness with concern for acute stroke. EXAM: CT ANGIOGRAPHY HEAD AND NECK CT PERFUSION WITH CONTRAST TECHNIQUE: Multidetector CT imaging of the head and neck was performed using the standard protocol during bolus administration of intravenous contrast. Multiplanar CT image reconstructions and MIPs were obtained to evaluate the vascular anatomy. Carotid stenosis measurements (when applicable) are obtained utilizing NASCET criteria, using the distal internal carotid diameter as the denominator. CT perfusion of the head was performed with automatic post processing, segmentation, and quantification by RAPID software. CONTRAST:  90 cc Isovue 370 COMPARISON:  None. FINDINGS: CTA NECK FINDINGS Aortic arch: Standard branching. Imaged portion shows no evidence of aneurysm or dissection. Aortic atherosclerosis with mixed calcified and fibro fatty plaque. Partially visualized pacemaker device and leads in the left subclavian vein and upper SVC. 50% stenosis of the origin of left subclavian artery. Right carotid system: No evidence of dissection, stenosis (50% or greater) or occlusion. Multiple levels of mixed plaque  of both the common and internal carotid segments with multiple areas of mild approximately 30% stenosis. Left carotid system: Left common carotid artery proximal fibro fatty plaque with 50% moderate stenosis (series 501, image 143). There additional common carotid artery plaques with minimal to mild stenosis and small plaque ulcerations just proximal to the bifurcation. Mixed plaque at the bifurcation with proximal ICA moderate 60% stenosis (series 501, image 96). Vertebral arteries: Codominant. No evidence of dissection, stenosis (50% or greater) or occlusion. Skeleton: Degenerative changes of the cervical spine without high-grade canal stenosis. Other neck: No mass, lymphadenopathy, or discrete inflammatory process of the neck identified. Upper chest: Peripheral fibrotic changes of the lung apices. Multiple areas of mucous plugging and airway thickening with scattered tiny nodules consistent with bronchiolitis. Review of the MIP images confirms the above findings CTA HEAD FINDINGS No aneurysm of circle of Willis identified. Anterior circulation: Extensive calcific atherosclerosis of cavernous and paraclinoid segments with moderate to severe right distal cavernous and moderate left supraclinoid internal carotid artery stenosis (series 503, image 80 and 85). Right M1 segment short segment of moderate to severe stenosis (series 503, image 87). Multiple additional areas of mild-to-moderate stenosis of the distal right MCA circulation. No significant stenosis of the left M1 segment. Multiple areas of mild-to-moderate stenosis in the distal left MCA circulation. No significant stenosis of bilateral A1 segments. Irregularity of the to approximately A3 segments with several short segments of moderate severe stenosis. Posterior circulation: Right greater than left distal P1 segment severe  stenosis. Multiple areas of vessel irregularity and mild-to-moderate stenosis of distal PCA segments bilaterally. Two segments of mid  and proximal basilar mild stenosis. Right vertebral artery mixed plaque with severe 90% stenosis/near occlusion (series 503, image 117). Left vertebral artery calcified plaque with moderate 50% stenosis (503:120). Venous sinuses: As permitted by contrast timing, patent. Anatomic variants: No posterior communicating artery identified, likely hypoplastic or absent. Small anterior communicating artery. Review of the MIP images confirms the above findings CT PERFUSION FINDINGS No significant rotational or translational motion of the patient. Adequate quality arterial inflow and venous outflow functions. Cerebral blood flow less than 30% volume: 0 cc T-max greater than 6 seconds volume: 9 cc (probably artifactual confined to the peripheries of cerebellum) T-max greater than 4 seconds volume: 94 cc in a right-sided watershed distribution. IMPRESSION: 1. No significant core infarct or ischemic penumbra on CT perfusion. 2. Right cerebral watershed territory increased T-max likely due to severe atherosclerotic disease in both the right ICA, right ACA, and right M1 segments. 3. No large vessel occlusion of the circle of Willis greatest in the right ICA distribution but advanced in all vascular territories. 4. Moderate stenosis plaques of the proximal left common carotid artery and left internal carotid artery. Otherwise no high-grade stenosis, dissection, or aneurysm of the carotid and vertebral arteries of the neck. These results were called by telephone at the time of interpretation on 09/16/2016 at 10:05 pm to Dr. Otelia Limes, who verbally acknowledged these results. Electronically Signed   By: Mitzi Hansen M.D.   On: 09/16/2016 22:15   Ct Angio Neck W And/or Wo Contrast  Result Date: 09/16/2016 CLINICAL DATA:  80 y/o F; left-sided weakness with concern for acute stroke. EXAM: CT ANGIOGRAPHY HEAD AND NECK CT PERFUSION WITH CONTRAST TECHNIQUE: Multidetector CT imaging of the head and neck was performed using the  standard protocol during bolus administration of intravenous contrast. Multiplanar CT image reconstructions and MIPs were obtained to evaluate the vascular anatomy. Carotid stenosis measurements (when applicable) are obtained utilizing NASCET criteria, using the distal internal carotid diameter as the denominator. CT perfusion of the head was performed with automatic post processing, segmentation, and quantification by RAPID software. CONTRAST:  90 cc Isovue 370 COMPARISON:  None. FINDINGS: CTA NECK FINDINGS Aortic arch: Standard branching. Imaged portion shows no evidence of aneurysm or dissection. Aortic atherosclerosis with mixed calcified and fibro fatty plaque. Partially visualized pacemaker device and leads in the left subclavian vein and upper SVC. 50% stenosis of the origin of left subclavian artery. Right carotid system: No evidence of dissection, stenosis (50% or greater) or occlusion. Multiple levels of mixed plaque of both the common and internal carotid segments with multiple areas of mild approximately 30% stenosis. Left carotid system: Left common carotid artery proximal fibro fatty plaque with 50% moderate stenosis (series 501, image 143). There additional common carotid artery plaques with minimal to mild stenosis and small plaque ulcerations just proximal to the bifurcation. Mixed plaque at the bifurcation with proximal ICA moderate 60% stenosis (series 501, image 96). Vertebral arteries: Codominant. No evidence of dissection, stenosis (50% or greater) or occlusion. Skeleton: Degenerative changes of the cervical spine without high-grade canal stenosis. Other neck: No mass, lymphadenopathy, or discrete inflammatory process of the neck identified. Upper chest: Peripheral fibrotic changes of the lung apices. Multiple areas of mucous plugging and airway thickening with scattered tiny nodules consistent with bronchiolitis. Review of the MIP images confirms the above findings CTA HEAD FINDINGS No  aneurysm of circle of Willis identified.  Anterior circulation: Extensive calcific atherosclerosis of cavernous and paraclinoid segments with moderate to severe right distal cavernous and moderate left supraclinoid internal carotid artery stenosis (series 503, image 80 and 85). Right M1 segment short segment of moderate to severe stenosis (series 503, image 87). Multiple additional areas of mild-to-moderate stenosis of the distal right MCA circulation. No significant stenosis of the left M1 segment. Multiple areas of mild-to-moderate stenosis in the distal left MCA circulation. No significant stenosis of bilateral A1 segments. Irregularity of the to approximately A3 segments with several short segments of moderate severe stenosis. Posterior circulation: Right greater than left distal P1 segment severe stenosis. Multiple areas of vessel irregularity and mild-to-moderate stenosis of distal PCA segments bilaterally. Two segments of mid and proximal basilar mild stenosis. Right vertebral artery mixed plaque with severe 90% stenosis/near occlusion (series 503, image 117). Left vertebral artery calcified plaque with moderate 50% stenosis (503:120). Venous sinuses: As permitted by contrast timing, patent. Anatomic variants: No posterior communicating artery identified, likely hypoplastic or absent. Small anterior communicating artery. Review of the MIP images confirms the above findings CT PERFUSION FINDINGS No significant rotational or translational motion of the patient. Adequate quality arterial inflow and venous outflow functions. Cerebral blood flow less than 30% volume: 0 cc T-max greater than 6 seconds volume: 9 cc (probably artifactual confined to the peripheries of cerebellum) T-max greater than 4 seconds volume: 94 cc in a right-sided watershed distribution. IMPRESSION: 1. No significant core infarct or ischemic penumbra on CT perfusion. 2. Right cerebral watershed territory increased T-max likely due to severe  atherosclerotic disease in both the right ICA, right ACA, and right M1 segments. 3. No large vessel occlusion of the circle of Willis greatest in the right ICA distribution but advanced in all vascular territories. 4. Moderate stenosis plaques of the proximal left common carotid artery and left internal carotid artery. Otherwise no high-grade stenosis, dissection, or aneurysm of the carotid and vertebral arteries of the neck. These results were called by telephone at the time of interpretation on 09/16/2016 at 10:05 pm to Dr. Otelia LimesLindzen, who verbally acknowledged these results. Electronically Signed   By: Mitzi HansenLance  Furusawa-Stratton M.D.   On: 09/16/2016 22:15   Ct Cerebral Perfusion W Contrast  Result Date: 09/16/2016 CLINICAL DATA:  80 y/o F; left-sided weakness with concern for acute stroke. EXAM: CT ANGIOGRAPHY HEAD AND NECK CT PERFUSION WITH CONTRAST TECHNIQUE: Multidetector CT imaging of the head and neck was performed using the standard protocol during bolus administration of intravenous contrast. Multiplanar CT image reconstructions and MIPs were obtained to evaluate the vascular anatomy. Carotid stenosis measurements (when applicable) are obtained utilizing NASCET criteria, using the distal internal carotid diameter as the denominator. CT perfusion of the head was performed with automatic post processing, segmentation, and quantification by RAPID software. CONTRAST:  90 cc Isovue 370 COMPARISON:  None. FINDINGS: CTA NECK FINDINGS Aortic arch: Standard branching. Imaged portion shows no evidence of aneurysm or dissection. Aortic atherosclerosis with mixed calcified and fibro fatty plaque. Partially visualized pacemaker device and leads in the left subclavian vein and upper SVC. 50% stenosis of the origin of left subclavian artery. Right carotid system: No evidence of dissection, stenosis (50% or greater) or occlusion. Multiple levels of mixed plaque of both the common and internal carotid segments with multiple  areas of mild approximately 30% stenosis. Left carotid system: Left common carotid artery proximal fibro fatty plaque with 50% moderate stenosis (series 501, image 143). There additional common carotid artery plaques with minimal to mild  stenosis and small plaque ulcerations just proximal to the bifurcation. Mixed plaque at the bifurcation with proximal ICA moderate 60% stenosis (series 501, image 96). Vertebral arteries: Codominant. No evidence of dissection, stenosis (50% or greater) or occlusion. Skeleton: Degenerative changes of the cervical spine without high-grade canal stenosis. Other neck: No mass, lymphadenopathy, or discrete inflammatory process of the neck identified. Upper chest: Peripheral fibrotic changes of the lung apices. Multiple areas of mucous plugging and airway thickening with scattered tiny nodules consistent with bronchiolitis. Review of the MIP images confirms the above findings CTA HEAD FINDINGS No aneurysm of circle of Willis identified. Anterior circulation: Extensive calcific atherosclerosis of cavernous and paraclinoid segments with moderate to severe right distal cavernous and moderate left supraclinoid internal carotid artery stenosis (series 503, image 80 and 85). Right M1 segment short segment of moderate to severe stenosis (series 503, image 87). Multiple additional areas of mild-to-moderate stenosis of the distal right MCA circulation. No significant stenosis of the left M1 segment. Multiple areas of mild-to-moderate stenosis in the distal left MCA circulation. No significant stenosis of bilateral A1 segments. Irregularity of the to approximately A3 segments with several short segments of moderate severe stenosis. Posterior circulation: Right greater than left distal P1 segment severe stenosis. Multiple areas of vessel irregularity and mild-to-moderate stenosis of distal PCA segments bilaterally. Two segments of mid and proximal basilar mild stenosis. Right vertebral artery mixed  plaque with severe 90% stenosis/near occlusion (series 503, image 117). Left vertebral artery calcified plaque with moderate 50% stenosis (503:120). Venous sinuses: As permitted by contrast timing, patent. Anatomic variants: No posterior communicating artery identified, likely hypoplastic or absent. Small anterior communicating artery. Review of the MIP images confirms the above findings CT PERFUSION FINDINGS No significant rotational or translational motion of the patient. Adequate quality arterial inflow and venous outflow functions. Cerebral blood flow less than 30% volume: 0 cc T-max greater than 6 seconds volume: 9 cc (probably artifactual confined to the peripheries of cerebellum) T-max greater than 4 seconds volume: 94 cc in a right-sided watershed distribution. IMPRESSION: 1. No significant core infarct or ischemic penumbra on CT perfusion. 2. Right cerebral watershed territory increased T-max likely due to severe atherosclerotic disease in both the right ICA, right ACA, and right M1 segments. 3. No large vessel occlusion of the circle of Willis greatest in the right ICA distribution but advanced in all vascular territories. 4. Moderate stenosis plaques of the proximal left common carotid artery and left internal carotid artery. Otherwise no high-grade stenosis, dissection, or aneurysm of the carotid and vertebral arteries of the neck. These results were called by telephone at the time of interpretation on 09/16/2016 at 10:05 pm to Dr. Otelia Limes, who verbally acknowledged these results. Electronically Signed   By: Mitzi Hansen M.D.   On: 09/16/2016 22:15   Ct Head Code Stroke W/o Cm  Result Date: 09/16/2016 CLINICAL DATA:  Code stroke.  Patient not moving her left side. EXAM: CT HEAD WITHOUT CONTRAST TECHNIQUE: Contiguous axial images were obtained from the base of the skull through the vertex without intravenous contrast. COMPARISON:  06/05/2016 CT head. FINDINGS: Brain: No evidence of large  acute territory infarct, focal mass effect, or intracranial hemorrhage. Stable chronic left parietal infarct. Stable right putamen, right caudate head, and right anterior insula chronic lacunar infarcts. Stable small chronic infarct within the right cerebellar hemisphere. Stable background of exam chronic microvascular ischemic changes and moderate parenchymal volume loss. No extra-axial collection identified. Nonspecific right parietal cortical calcifications are unchanged. Vascular: Calcific  atherosclerosis of the cavernous internal carotid arteries and vertebral arteries. Skull: Normal. Negative for fracture or focal lesion. Sinuses/Orbits: Right frontal sinus mucous retention cyst. Otherwise the visualized paranasal sinuses and mastoids are clear. Other: None. ASPECTS [facility]([place] Stroke Program Early CT Score) - Ganglionic level infarction (caudate, lentiform nuclei, internal capsule, insula, M1-M3 cortex): 7 - Supraganglionic infarction (M4-M6 cortex): 3 Total score (0-10 with 10 being normal): 10 IMPRESSION: 1. No evidence of large acute infarct, mass effect, or intracranial hemorrhage and symptoms persist or if clinically indicated MRI is more sensitive for acute stroke. 2. ASPECTS is 10 3. Stable background of small chronic infarcts, advanced chronic microvascular ischemic changes, and moderate parenchymal volume loss. These results were called by telephone at the time of interpretation on 09/16/2016 at 9:16 pm to Dr. Chaney MallingAVID YAO , who verbally acknowledged these results. Electronically Signed   By: Mitzi HansenLance  Furusawa-Stratton M.D.   On: 09/16/2016 21:16    EKG: Pending.  Prior EKG showed V paced rhythm.  Assessment/Plan Principal Problem:   Stroke [facility](HCC) Active Problems:   Essential hypertension   Atrial fibrillation (HCC)   Pacemaker   COPD (chronic obstructive pulmonary disease) (HCC)   Diabetes (HCC)   Dementia   Cerebrovascular accident (CVA) due to embolism of left middle cerebral artery (HCC)        Acute CVA based on symptoms though imaging does not show acute findings at this time. --Neurology consultant appreciated; recommendations noted --Continue plavix.  Patient will need an NGT tube if she fails her swallow study. --Repeat head CT in 48 hours --Statin recommended, but the patient has a history of allergy to this class of medications. --Complete echo in the AM --Permissive HTN x 24 hours --PT/OT/Speech evaluations --NPO until she passes bedside swallow evaluation.  Again, she may need NG tube for meds. --Neurochecks per protocol.    History of chronic afib --Paced --On oral cardizem --No anticoagulation due to recurrent falls and risk for bleeding.  History of COPD --O2 as needed for hypoxia.  Compensated and stable at this time.  DM --Low dose sliding scale coverage with meals  Dementia --May need prn haldol or xanax for behavioral disturbance   DVT prophylaxis: SCDs Code Status: DNR/DNI Family Communication: Patient's daughter, grandson present in the ED at time of admission. Disposition Plan: SNF Consults called: Neurology Admission status: In;patient, telemetry   TIME SPENT: 70 minutes   Jerene Bearsarter,Rashada Klontz Harrison MD [facility] Pager [phone]  If 7PM-7AM, please contact night-coverage [email or web address] Password [email or web address]  09/16/2016, 10:53 PM

## 2016-09-16 NOTE — Consult Note (Addendum)
Referring Physician: ED Triage    Chief Complaint: Rightward gaze deviation, left facial droop and left sided weakness.  HPI: [name] [name] is an 80 y.o. female with PMHx of atrial fibrillation, pacemaker and left MCA stroke, who presents with acute onset today of riightward gaze deviation and left sided weakness. LKN was 1600. Symptoms first noted at 1900. Initial BP was 230/110. At 1930, bp was 194/109.  She has residual dysphasia from her prior left MCA stroke. Was unable to take Coumadin as she was a falls risk given a prior diagnosis of dementia. She is on Plavix for stroke prevention. She resides at an assisted living facility.   LSN: 1600 tPA Given: No, due to time frame criteria  Past Medical History:  Diagnosis Date  . Anemia    "as a child" (08/05/2013)  . Anxiety   . Arthritis    "all over me; in all my joints" (08/05/2013)  . Atrial fibrillation (HCC)    a. Chronic; No longer on coumadin 2/2 frequent falls.  . Chest pain    a. 08/2011 Myoview: sm, fixed anteroseptal defect (attenuation), no ischemia, EF 62%.  . CHF (congestive heart failure) (HCC)   . Coccyx pain   . COPD (chronic obstructive pulmonary disease) (HCC)   . Dementia   . Depression   . Falls frequently    coumadin stopped  . GERD (gastroesophageal reflux disease)   . Gout    "right foot" (08/05/2013)  . H/O hiatal hernia   . History of pneumonia    "couple times; long time ago" (08/05/2013)  . Hypercholesterolemia   . Hypertension   . Hypertrophic cardiomyopathy (HCC)    a. 11/2013 Echo: EF 55-60%, basal inf HK, sev dil LA, no evidence of HCM.  PASP .  [name] Hyponatremia   . Hyposmolality   . Osteoarthritis   . Oxygen dependent    "3L 24/7" (08/05/2013)  . Pacemaker    a. 04/2012 MDT [id number] [employer] PPM, ser #: [id number] H.  . Pulmonary HTN    a. has had prior right heart cath in 2010; was felt that most likely due to elevated left sided pressures and would not benefit from vasodilator therapy;  b.  11/2013 Echo: PASP .  . Situational mixed anxiety and depressive disorder   . TIA (transient ischemic attack)   . Type II diabetes mellitus (HCC)   . Varicose veins     Past Surgical History:  Procedure Laterality Date  . ABDOMINAL HYSTERECTOMY     "partial" (08/05/2013)  . APPENDECTOMY    . BACK SURGERY     "bone spur removed; mid back" (08/05/2013)  . CARDIAC CATHETERIZATION     "more than once" (08/05/2013)  . DILATION AND CURETTAGE OF UTERUS     "several" (08/05/2013)  . FOOT NEUROMA SURGERY Right   . INSERT / REPLACE / REMOVE PACEMAKER  1998; 2000's; 2014  . PACEMAKER GENERATOR CHANGE N/A 04/24/2012   Procedure: PACEMAKER GENERATOR CHANGE;  Surgeon: Duke Salvia, MD;  Location: [facility] C[facility] LAB;  Service: Cardiovascular;  Laterality: N/A;  . TONSILLECTOMY    . TOTAL KNEE ARTHROPLASTY Right 2011    Family History  Problem Relation Age of Onset  . Other Father     died in plane crash  . Cancer Mother     lymphoma-NHL  . Coronary artery disease Grandchild   . Heart disease Maternal Grandmother    Social History:  reports that she quit smoking about 10 years ago. Her smoking use  included Cigarettes. She has a 40.00 pack-year smoking history. She has never used smokeless tobacco. She reports that she does not drink alcohol or use drugs.  Allergies:  Allergies  Allergen Reactions  . Aspirin Nausea And Vomiting  . Calan [Verapamil Hcl] Other (See Comments)    Patient states it makes her "out of her mind"; bp bottoms out (takes diltiazem at home)  . Crestor [Rosuvastatin Calcium] Other (See Comments)    Muscle pain  . Escitalopram Oxalate Other (See Comments)    Reaction Unknown   . Lipitor [Atorvastatin Calcium] Other (See Comments)    myalgia  . Lopressor [Metoprolol Tartrate] Other (See Comments)    Blood pressure bottoms out   . Nitroglycerin Other (See Comments)    Reaction unknown  . Norvasc [Amlodipine Besylate] Other (See Comments)    fatigue  . Nsaids  Nausea Only  . Oxycodone Other (See Comments)    Reaction unknown  . Prednisone Swelling  . Sulfa Antibiotics Nausea Only and Other (See Comments)    Makes her stomach hurt  . Vimovo [Naproxen-Esomeprazole] Nausea Only  . Penicillins Hives and Rash    Medications: Complete medication list from her assisted living facility was reviewed.   ROS: Unable to obtain detailed ROS due to dysphasia and dementia. No evidence for pain.   Physical Examination: BP 184/96   Pulse 84   Temp 97.2 F (36.2 C) (Oral)   Resp 20   SpO2 98%   NIHSS = 13  Neurologic Examination: Ment: Awake and alert. Poor orientation. Attention: Fair. Impaired repetition, naming and comprehension. Nonsensical speech when attempting to answer detailed questions. Can answer several simple questions with 1-2 word answers. Follows some simple motor commands.  CN: PERRL. Visual fields intact. Forced rightward gaze deviation. Unable to deviate eyes to left with oculocephalic maneuver. Decreased attention to left CNV tactile stimuli. Left facial droop. Hearing intact to simple commands. Does not follow commands for tongue deviation. Head tends to rotate to the right.  Motor: 4+/5 LUE and LLE with decreased spontaneous movement and delayed response to commands. RUE and RLE 5/5.  Sensory: Decreased attention to LUE and LLE stimuli.  Reflexes: Mildly hypoactive x 4. Left toe equivocal, right toe downgoing.  Cerebellar: No gross ataxia noted.  Gait: Unable to assess due to falls risk.   General: Anicteric, noncyanotic. No gross evidence for trauma. No gross wheezing noted. Limbs warm and well perfused.   Results for orders placed or performed during the hospital encounter of 09/16/16 (from the past 48 hour(s))  I-Stat Chem 8, ED     Status: Abnormal   Collection Time: 09/16/16  8:56 PM  Result Value Ref Range   Sodium 142 135 - 145 mmol/L   Potassium 4.1 3.5 - 5.1 mmol/L   Chloride 107 101 - 111 mmol/L   BUN 32 (H) 6 - 20  mg/dL   Creatinine, Ser 1.61 0.44 - 1.00 mg/dL   Glucose, Bld 096 (H) 65 - 99 mg/dL   Calcium, Ion 0.45 (L) 1.15 - 1.40 mmol/L   TCO2 24 0 - 100 mmol/L   Hemoglobin 14.6 12.0 - 15.0 g/dL   HCT 40.9 81.1 - 91.4 %  CBC     Status: None   Collection Time: 09/16/16  9:03 PM  Result Value Ref Range   WBC 5.6 4.0 - 10.5 K/uL   RBC 4.51 3.87 - 5.11 MIL/uL   Hemoglobin 15.0 12.0 - 15.0 g/dL   HCT 78.2 95.6 - 21.3 %  MCV 98.0 78.0 - 100.0 fL   MCH 33.3 26.0 - 34.0 pg   MCHC 33.9 30.0 - 36.0 g/dL   RDW 78.212.8 95.611.5 - 21.315.5 %   Platelets 194 150 - 400 K/uL  Differential     Status: None   Collection Time: 09/16/16  9:03 PM  Result Value Ref Range   Neutrophils Relative % 60 %   Neutro Abs 3.4 1.7 - 7.7 K/uL   Lymphocytes Relative 29 %   Lymphs Abs 1.6 0.7 - 4.0 K/uL   Monocytes Relative 8 %   Monocytes Absolute 0.4 0.1 - 1.0 K/uL   Eosinophils Relative 3 %   Eosinophils Absolute 0.2 0.0 - 0.7 K/uL   Basophils Relative 0 %   Basophils Absolute 0.0 0.0 - 0.1 K/uL   Ct Head Code Stroke W/o Cm  Result Date: 09/16/2016 CLINICAL DATA:  Code stroke.  Patient not moving her left side. EXAM: CT HEAD WITHOUT CONTRAST TECHNIQUE: Contiguous axial images were obtained from the base of the skull through the vertex without intravenous contrast. COMPARISON:  06/05/2016 CT head. FINDINGS: Brain: No evidence of large acute territory infarct, focal mass effect, or intracranial hemorrhage. Stable chronic left parietal infarct. Stable right putamen, right caudate head, and right anterior insula chronic lacunar infarcts. Stable small chronic infarct within the right cerebellar hemisphere. Stable background of exam chronic microvascular ischemic changes and moderate parenchymal volume loss. No extra-axial collection identified. Nonspecific right parietal cortical calcifications are unchanged. Vascular: Calcific atherosclerosis of the cavernous internal carotid arteries and vertebral arteries. Skull: Normal. Negative  for fracture or focal lesion. Sinuses/Orbits: Right frontal sinus mucous retention cyst. Otherwise the visualized paranasal sinuses and mastoids are clear. Other: None. ASPECTS [facility]([place] Stroke Program Early CT Score) - Ganglionic level infarction (caudate, lentiform nuclei, internal capsule, insula, M1-M3 cortex): 7 - Supraganglionic infarction (M4-M6 cortex): 3 Total score (0-10 with 10 being normal): 10 IMPRESSION: 1. No evidence of large acute infarct, mass effect, or intracranial hemorrhage and symptoms persist or if clinically indicated MRI is more sensitive for acute stroke. 2. ASPECTS is 10 3. Stable background of small chronic infarcts, advanced chronic microvascular ischemic changes, and moderate parenchymal volume loss. These results were called by telephone at the time of interpretation on 09/16/2016 at 9:16 pm to Dr. Chaney MallingAVID YAO , who verbally acknowledged these results. Electronically Signed   By: Mitzi HansenLance  Furusawa-Stratton M.D.   On: 09/16/2016 21:16    Assessment: 80 y.o. female with acute onset of rightward gaze deviation, left facial droop and left hemiparesis. 1. Signs best localize as a right frontal lobe stroke. CT head without hemorrhage. CT perfusion with mild perfusion deficit in right hemisphere exhibiting a watershed distribution. Chronic and extensive atherosclerotic disease with multifocal narrowing on CTA, but no LVO amenable to endovascular procedure. Out of time window for IV tPA.  2. Chronic left MCA territory infarction with residual dysphasia.  3. Dementia. 4. Atrial fibrillation.  5. Falls risk.   Stroke Risk Factors - atrial fibrillation, prior stroke, age.   Plan: 1. Unable to perform MRI due to pacemaker.  2. New stroke not visible on current CT. Obtain repeat CT in 48 hours to reassess.  3. PT consult, OT consult, Speech consult 4. TTE. 5. Statin for secondary stroke prevention.  6. Telemetry monitoring 7. Frequent neuro checks 8. HgbA1c, fasting lipid panel 9.  Permissive HTN x 24 hours.  10. Continue Plavix per NGT if unable to pass swallow study. She has an allergy to ASA.  11.  May benefit from a trial of Aricept for her dementia.  12. Discussed with family.   @Electronically  signed: Dr. Caryl PinaEric Juni Glaab@   @strokeconsult @ 09/16/2016, 9:22 PM

## 2016-09-16 NOTE — Progress Notes (Signed)
Mrs. [name] [name] is a 80 yo female, came to ED from [facility] as a Code Stroke. Pt is from an assisted living, history of A-fib, HTN, Hypercholesterolemia, DM, COPD, CHF, TIA. LKW 1600, BP 230/110, 194/109. Per EMS pt had Right side facial droop, Right gaze, fixed pupil and garbled speech. CBG 128, NIH 12. Pt to be admitted to [facility] for observation

## 2016-09-17 ENCOUNTER — Encounter
Admission: RE | Admit: 2016-09-17 | Discharge: 2016-09-17 | Disposition: A | Discharge status: Home / Self Care | Source: Ambulatory Visit | Attending: Internal Medicine | Admitting: Internal Medicine

## 2016-09-17 ENCOUNTER — Other Ambulatory Visit (HOSPITAL_COMMUNITY): Payer: Self-pay | Admitting: *Deleted

## 2016-09-17 ENCOUNTER — Inpatient Hospital Stay (HOSPITAL_COMMUNITY): Payer: Medicare Other

## 2016-09-17 DIAGNOSIS — E119 Type 2 diabetes mellitus without complications: Secondary | ICD-10-CM | POA: Insufficient documentation

## 2016-09-17 DIAGNOSIS — R4182 Altered mental status, unspecified: Secondary | ICD-10-CM

## 2016-09-17 DIAGNOSIS — I63411 Cerebral infarction due to embolism of right middle cerebral artery: Secondary | ICD-10-CM

## 2016-09-17 DIAGNOSIS — R350 Frequency of micturition: Secondary | ICD-10-CM | POA: Insufficient documentation

## 2016-09-17 DIAGNOSIS — R410 Disorientation, unspecified: Secondary | ICD-10-CM | POA: Insufficient documentation

## 2016-09-17 LAB — LIPID PANEL
CHOL/HDL RATIO: 6.7 ratio
Cholesterol: 288 mg/dL — ABNORMAL HIGH (ref 0–200)
HDL: 43 mg/dL (ref >40)
LDL CALC: 207 mg/dL — AB (ref 0–99)
Triglycerides: 190 mg/dL — ABNORMAL HIGH (ref <150)
VLDL: 38 mg/dL (ref 0–40)

## 2016-09-17 LAB — GLUCOSE, CAPILLARY
GLUCOSE-CAPILLARY: 118 mg/dL — AB (ref 65–99)
GLUCOSE-CAPILLARY: 136 mg/dL — AB (ref 65–99)
Glucose-Capillary: 116 mg/dL — ABNORMAL HIGH (ref 65–99)
Glucose-Capillary: 77 mg/dL (ref 65–99)

## 2016-09-17 LAB — MRSA PCR SCREENING: MRSA BY PCR: NEGATIVE

## 2016-09-17 MED ORDER — STARCH (THICKENING) PO POWD
ORAL | Status: DC | PRN
  Filled 2016-09-17: qty 227

## 2016-09-17 NOTE — Progress Notes (Signed)
STROKE TEAM PROGRESS NOTE   HISTORY OF PRESENT ILLNESS (per record) Morey HummingbirdMae L Hilyer is an 80 y.o. female with PMHx of atrial fibrillation, pacemaker and left MCA stroke, who presents with acute onset today of riightward gaze deviation and left sided weakness. LKN was 1600 09/16/2016. Symptoms first noted at 1900. Initial BP was 230/110. At 1930, bp was 194/109.  She has residual dysphasia from her prior left MCA stroke. Was unable to take Coumadin as she was a falls risk given a prior diagnosis of dementia. She is on Plavix for stroke prevention. She resides at an assisted living facility.   Patient was not administered IV t-PA secondary to delay in arrival. She was admitted for further evaluation and treatment.   SUBJECTIVE (INTERVAL HISTORY) Daughter at bedside. Pt was here in 03/2016 for left MCA infarct. Family decided no DOAC but plavix. She was sent to rehab but now resides in SNF. Since last stroke pt continued to have aphasia and only limited words out intermittently.    OBJECTIVE Temp:  [97.2 F (36.2 C)-98 F (36.7 C)] 97.6 F (36.4 C) (11/07 0710) Pulse Rate:  [60-93] 60 (11/07 0710) Cardiac Rhythm: Atrial fibrillation (11/06 2353) Resp:  [18-23] 20 (11/07 0710) BP: (133-193)/(54-100) 182/77 (11/07 0710) SpO2:  [97 %-100 %] 99 % (11/07 0710) Weight:  [55.5 kg (122 lb 5.7 oz)] 55.5 kg (122 lb 5.7 oz) (11/06 2322)  CBC:  Recent Labs Lab 09/16/16 2056 09/16/16 2103  WBC  --  5.6  NEUTROABS  --  3.4  HGB 14.6 15.0  HCT 43.0 44.2  MCV  --  98.0  PLT  --  194    Basic Metabolic Panel:  Recent Labs Lab 09/16/16 2056 09/16/16 2103  NA 142 140  K 4.1 4.1  CL 107 105  CO2  --  25  GLUCOSE 116* 126*  BUN 32* 26*  CREATININE 1.00 1.15*  CALCIUM  --  9.9    Lipid Panel:    Component Value Date/Time   CHOL 288 (H) 09/17/2016 0634   TRIG 190 (H) 09/17/2016 0634   HDL 43 09/17/2016 0634   CHOLHDL 6.7 09/17/2016 0634   VLDL 38 09/17/2016 0634   LDLCALC 207 (H)  09/17/2016 0634   HgbA1c:  Lab Results  Component Value Date   HGBA1C 5.4 02/02/2016   Urine Drug Screen:    Component Value Date/Time   LABOPIA NONE DETECTED 02/01/2016 2325   COCAINSCRNUR NONE DETECTED 02/01/2016 2325   COCAINSCRNUR NEGATIVE 02/07/2015 2047   LABBENZ NONE DETECTED 02/01/2016 2325   AMPHETMU NONE DETECTED 02/01/2016 2325   THCU NONE DETECTED 02/01/2016 2325   LABBARB NONE DETECTED 02/01/2016 2325      IMAGING I have personally reviewed the radiological images below and agree with the radiology interpretations.  Ct Head Code Stroke W/o Cm 09/16/2016 1. No evidence of large acute infarct, mass effect, or intracranial hemorrhage and symptoms persist or if clinically indicated MRI is more sensitive for acute stroke. 2. ASPECTS is 10 3. Stable background of small chronic infarcts, advanced chronic microvascular ischemic changes, and moderate parenchymal volume loss.   Ct Angio Head W Or Wo Contrast Ct Angio Neck W And/or Wo Contrast Ct Cerebral Perfusion W Contrast 09/16/2016 1. No significant core infarct or ischemic penumbra on CT perfusion. 2. Right cerebral watershed territory increased T-max likely due to severe atherosclerotic disease in both the right ICA, right ACA, and right M1 segments. 3. No large vessel occlusion of the circle of Willis greatest  in the right ICA distribution but advanced in all vascular territories. 4. Moderate stenosis plaques of the proximal left common carotid artery and left internal carotid artery. Otherwise no high-grade stenosis, dissection, or aneurysm of the carotid and vertebral arteries of the neck.   EEG - diffuse cerebral dysfunction that is non-specific in etiology and can be seen with hypoxic/ischemic injury, toxic/metabolic encephalopathies, neurodegenerative disorders, or medication effect.  The absence of epileptiform discharges does not rule out a clinical diagnosis of epilepsy.  Clinical correlation is advised.  CT repeat  pending    PHYSICAL EXAM  Temp:  [97.2 F (36.2 C)-98 F (36.7 C)] 97.7 F (36.5 C) (11/07 2106) Pulse Rate:  [60-91] 67 (11/07 2106) Resp:  [18-23] 18 (11/07 2106) BP: (116-193)/(74-95) 157/92 (11/07 2106) SpO2:  [96 %-100 %] 96 % (11/07 2106) Weight:  [122 lb 5.7 oz (55.5 kg)] 122 lb 5.7 oz (55.5 kg) (11/06 2322)  General - Well nourished, well developed, in no apparent distress.  Ophthalmologic - Fundi not visualized due to noncooperation.  Cardiovascular - Regular rate and rhythm, not in afib.  Neuro - awake, but not alert, not following commands, non verbal overall, but can have very limited words out such as "water". Not able to name or repeat. PERRL, eye deviated to right, left gaze and left neglect. Facial symmetrical. Tongue midline in mouth. Able to move BUEs and BLEs symmetrically and against gravity on pain stimulatoin, no hemiparesis seen. DTR 1+ and no babinski. Sensation, coordination and gait not tested.    ASSESSMENT/PLAN Ms. Leitha ETTA GASSETT is a 80 y.o. female with history of atrial fibrillation, pacemaker and left MCA stroke presenting with R gaze deviation, L facial droop and L sided weakness. She did not receive IV t-PA due to delay in arival.   ? stroke:  Right brain infarct embolic secondary to afib not on AC. However, with only right gaze without hemiparesis and no LVO, will need to rule out seizure.  Resultant  Right gaze and nonverbal  MRI and MRA not able to be done due to pacer  CTA head and neck no infarct. Severe atherosclerosis R ICA, R ACA R M1. No LVO.  CT perfusion  No penumbra  EEG - diffuse slow, no seizure like activity  CT repeat in am  LDL 207  HgbA1c pending  SCDs for VTE prophylaxis  Diet heart healthy/carb modified Room service appropriate? Yes; Fluid consistency: Thin  clopidogrel 75 mg daily prior to admission, now on clopidogrel 75 mg daily. (ASA allergy - pt with N&V related to ASA)  Discussed with daughter, pt will back to  SNF instead of rehab. No aggressive measures, will connect with hospice once at home.  Therapy recommendations:  pending   Disposition:  pending  (admitted from SNF)  Atrial Fibrillation  Home anticoagulation:  none due to fall risk  No DOAC as per family due to frequent falls.  Continue plavix  On cardiazem  Rate controlled.    Hypertension  Stable  Permissive hypertension (OK if < 220/120) but gradually normalize in 5-7 days  Long-term BP goal normotensive  Hyperlipidemia  Home meds:  No statin  LDL 207, goal < 70  Statin allergy  Diabetes type II  HgbA1c pending, goal < 7.0  Other Stroke Risk Factors  Advanced age  Former Cigarette smoker  CHF, hypertrophic cardiomyopathy  Other Active Problems  Baseline dementia  COPD, O2 dependent  Hospital day # 1  Had long discussion with daughter. pt baseline condition is  poor, currently with poor prognosis too. Daughter requested no aggressive measure, wish to go back to SNF after stabilization, and to connect again with hospice while pt is outside hospital.  Marvel PlanJindong Everson Mott, MD PhD Stroke Neurology 09/17/2016 10:54 PM     To contact Stroke Continuity provider, please refer to [email or web address]. After hours, contact General Neurology

## 2016-09-17 NOTE — Progress Notes (Signed)
Speech Language Pathology Treatment: Dysphagia  Patient Details Name: [name] [name] MRN: [id number] DOB: 08/06/1936 Today's Date: 09/17/2016 Time: [id number] SLP Time Calculation (min) (ACUTE ONLY): 20 min  Assessment / Plan / Recommendation Clinical Impression  Skilled treatment session focused on dysphagia goals. PT with decreased bolus management of soft solids c/b prolonged oral prep time and inability to effectively clear bolus. Pt with cough present. Pt with continued decreased lingual manipulation of puree but effectively cleared without overt s/s of aspiration. Pt with overt s/s of aspiration with thin liquids d/t delayed swallow initiation and immediate coughing. Pt without overt s/s of aspiration with necatr thick liquids.    HPI HPI: [name] [name] is a 80 y.o. woman with a history of dementia, chronic atrial fibrillation (CHADS-Vasc score of at least 5, no longer anticoagulated due to recurrent falls and bleeding risk), permanent pacemaker implant, HTN, DM, CHF, COPD, and prior CVA one year ago who was referred to the ED via EMS for evaluation of acute mental status changes, facial droop with right gaze preference, and garbled speech in the setting of elevated blood pressures.  CT scan still negative for new CVA at time of eval.           SLP Plan  Continue with current plan of care     Recommendations  Diet recommendations: Dysphagia 1 (puree);Nectar-thick liquid Liquids provided via: Straw Medication Administration: Crushed with puree Supervision: Full supervision/cueing for compensatory strategies;Staff to assist with self feeding Compensations: Minimize environmental distractions;Slow rate;Small sips/bites;Follow solids with liquid Postural Changes and/or Swallow Maneuvers: Seated upright 90 degrees                Oral Care Recommendations: Oral care BID Follow up Recommendations: Skilled Nursing facility Plan: Continue with current plan of care       GO            [name] [name] B. [name], M.S., CCC-SLP Speech-Language Pathologist    Latacha Texeira 09/17/2016, 5:52 PM

## 2016-09-17 NOTE — Plan of Care (Signed)
Problem: SLP Dysphagia Goals Goal: Misc Dysphagia Goal Pt will consume least restrictive diet without overt s/s of aspiration during this hospitalization to decreased risk of aspiration pneumonia, choking, dehydration and malnutrition.   Comments: Pt will consume least restrictive diet without overt s/s of aspiration during this hospitalization to decreased risk of aspiration pneumonia, choking, dehydration and malnutrition.

## 2016-09-17 NOTE — Evaluation (Signed)
Speech Language Pathology Evaluation Patient Details Name: [name] [name] MRN: [id number] DOB: 08/31/1936 Today's Date: 09/17/2016 Time: [id number] SLP Time Calculation (min) (ACUTE ONLY): 25 min  Problem List:  Patient Active Problem List   Diagnosis Date Noted  . Stroke (HCC) 09/16/2016  . Paroxysmal atrial fibrillation (HCC)   . Encounter for palliative care   . Goals of care, counseling/discussion   . Altered mental state   . Cerebrovascular accident (CVA) due to embolism of left middle cerebral artery (HCC)   . Dementia   . Facial asymmetry 02/01/2016  . Altered mental status 02/01/2016  . Closed head injury 05/30/2014  . Pulmonary hypertension, moderate to severe 05/30/2014  . Diabetes (HCC) 05/30/2014  . Hypoglycemia 05/29/2014  . Hypokalemia 05/29/2014  . Subdural hematoma caused by concussion (HCC) 05/28/2014  . hemorrhagic cerebral contusion 05/28/2014  . Dyslipidemia 03/07/2014  . Chronic respiratory failure (HCC) 01/01/2014  . Chest pain   . Compression fracture of L4 lumbar vertebra (HCC) 11/18/2013  . Syncope 11/18/2013  . Protein-calorie malnutrition, severe (HCC) 11/18/2013  . Syncope and collapse 11/17/2013  . Closed compression fracture of L4 lumbar vertebra (HCC) 11/17/2013  . Gait difficulty 10/05/2013  . Fall (on)(from) incline, initial encounter 10/03/2013  . Essential hypertension, benign 10/03/2013  . Unsteady gait 10/03/2013  . Peripheral neuropathy (HCC) 10/03/2013  . Back pain 08/06/2013  . Near syncope 08/06/2013  . Pulmonary hypertension 08/05/2013  . Abnormality of gait 05/24/2013  . Acute respiratory distress 05/08/2013  . Pulmonary edema, acute (HCC) 05/08/2013  . Benign hypertensive heart disease without heart failure   . Pulmonary HTN   . Oxygen dependent   . COPD (chronic obstructive pulmonary disease) (HCC)   . Fatigue due to cardiopulmonary and deconditioning 04/23/2013  . Memory deficit 02/07/2013  . Memory change 12/11/2012  . TIA  (transient ischemic attack) 02/19/2012  . Chest pain at rest 08/05/2011  . Type II or unspecified type diabetes mellitus without mention of complication, uncontrolled 06/12/2011  . Fall 03/22/2011  . Mitral regurgitation 03/13/2011  . Hypertrophic cardiomyopathy (HCC)   . Osteoarthritis   . Situational mixed anxiety and depressive disorder   . Pacemaker 02/26/2011  . Atrial fibrillation (HCC) 01/28/2011  . Essential hypertension 05/31/2009  . ALLERGIC RHINITIS 05/31/2009   Past Medical History:  Past Medical History:  Diagnosis Date  . Anemia    "as a child" (08/05/2013)  . Anxiety   . Arthritis    "all over me; in all my joints" (08/05/2013)  . Atrial fibrillation (HCC)    a. Chronic; No longer on coumadin 2/2 frequent falls.  . Chest pain    a. 08/2011 Myoview: sm, fixed anteroseptal defect (attenuation), no ischemia, EF 62%.  . CHF (congestive heart failure) (HCC)   . Coccyx pain   . COPD (chronic obstructive pulmonary disease) (HCC)   . Dementia   . Depression   . Falls frequently    coumadin stopped  . GERD (gastroesophageal reflux disease)   . Gout    "right foot" (08/05/2013)  . H/O hiatal hernia   . History of pneumonia    "couple times; long time ago" (08/05/2013)  . Hypercholesterolemia   . Hypertension   . Hypertrophic cardiomyopathy (HCC)    a. 11/2013 Echo: EF 55-60%, basal inf HK, sev dil LA, no evidence of HCM.  PASP 60mmHg.  [name]. Hyponatremia   . Hyposmolality   . Osteoarthritis   . Oxygen dependent    "3L 24/7" (08/05/2013)  . Pacemaker  a. 04/2012 MDT [id number] [employer] DC PPM, ser #: [id number] H.  . Pulmonary HTN    a. has had prior right heart cath in 2010; was felt that most likely due to elevated left sided pressures and would not benefit from vasodilator therapy;  b. 11/2013 Echo: PASP 60mmHg.  . Situational mixed anxiety and depressive disorder   . TIA (transient ischemic attack)   . Type II diabetes mellitus (HCC)   . Varicose veins    Past Surgical  History:  Past Surgical History:  Procedure Laterality Date  . ABDOMINAL HYSTERECTOMY     "partial" (08/05/2013)  . APPENDECTOMY    . BACK SURGERY     "bone spur removed; mid back" (08/05/2013)  . CARDIAC CATHETERIZATION     "more than once" (08/05/2013)  . DILATION AND CURETTAGE OF UTERUS     "several" (08/05/2013)  . FOOT NEUROMA SURGERY Right   . INSERT / REPLACE / REMOVE PACEMAKER  1998; 2000's; 2014  . PACEMAKER GENERATOR CHANGE N/A 04/24/2012   Procedure: PACEMAKER GENERATOR CHANGE;  Surgeon: Duke SalviaSteven C Klein, MD;  [facility];  Service: Cardiovascular;  Laterality: N/A;  . TONSILLECTOMY    . TOTAL KNEE ARTHROPLASTY Right 2011   HPI:  [name] [name] is a 80 y.o. woman with a history of dementia, chronic atrial fibrillation (CHADS-Vasc score of at least 5, no longer anticoagulated due to recurrent falls and bleeding risk), permanent pacemaker implant, HTN, DM, CHF, COPD, and prior CVA one year ago who was referred to the ED via EMS for evaluation of acute mental status changes, facial droop with right gaze preference, and garbled speech in the setting of elevated blood pressures.  CT scan still negative for new CVA at time of eval.        Assessment / Plan / Recommendation Clinical Impression  Pt appears to have significant difficulties communicating wants and needs and is poor historian. Daughter present and difficult to determine any acute cognitive-linguistic deficits at this time d/t significant impairments at baseline. Daughter describes pt's baseline cognitive-lingusitic abilities to be intermittent garbled speech, slurred speech and difficulty interacting. Focus of skilled treatment within acute setting will be dysphagia and aspiration prevent. Daughter agreeable.     SLP Assessment  Patient needs continued Speech Lanaguage Pathology Services (Dysphagia)    Follow Up Recommendations  Skilled Nursing facility          SLP Evaluation Cognition  Overall Cognitive Status:  History of cognitive impairments - at baseline Arousal/Alertness: Awake/alert Orientation Level: Oriented to person;Disoriented to place;Disoriented to time;Disoriented to situation       Comprehension  Auditory Comprehension Overall Auditory Comprehension: Impaired at baseline Visual Recognition/Discrimination Discrimination:  (Right gaze)    Expression Expression Primary Mode of Expression: Verbal Verbal Expression Overall Verbal Expression: Impaired at baseline Written Expression Dominant Hand: Right   Oral / Motor  Oral Motor/Sensory Function Overall Oral Motor/Sensory Function: Moderate impairment Facial ROM: Reduced left Facial Symmetry: Abnormal symmetry left Facial Strength: Reduced left Facial Sensation: Reduced left Lingual ROM: Reduced left Lingual Symmetry: Abnormal symmetry left Lingual Strength: Reduced Lingual Sensation: Reduced Velum: Within Functional Limits Mandible: Impaired Motor Speech Overall Motor Speech: Impaired at baseline   GO                   Trusten Hume B. [name], M.S., CCC-SLP Speech-Language Pathologist  Rosangelica Pevehouse 09/17/2016, 5:26 PM

## 2016-09-17 NOTE — Evaluation (Signed)
Clinical/Bedside Swallow Evaluation Patient Details  Name: [name] [name] MRN: [id number] Date of Birth: 11/04/1936  Today's Date: 09/17/2016 Time: SLP Start Time (ACUTE ONLY): 1653 SLP Stop Time (ACUTE ONLY): 1713 SLP Time Calculation (min) (ACUTE ONLY): 20 min  Past Medical History:  Past Medical History:  Diagnosis Date  . Anemia    "as a child" (08/05/2013)  . Anxiety   . Arthritis    "all over me; in all my joints" (08/05/2013)  . Atrial fibrillation (HCC)    a. Chronic; No longer on coumadin 2/2 frequent falls.  . Chest pain    a. 08/2011 Myoview: sm, fixed anteroseptal defect (attenuation), no ischemia, EF 62%.  . CHF (congestive heart failure) (HCC)   . Coccyx pain   . COPD (chronic obstructive pulmonary disease) (HCC)   . Dementia   . Depression   . Falls frequently    coumadin stopped  . GERD (gastroesophageal reflux disease)   . Gout    "right foot" (08/05/2013)  . H/O hiatal hernia   . History of pneumonia    "couple times; long time ago" (08/05/2013)  . Hypercholesterolemia   . Hypertension   . Hypertrophic cardiomyopathy (HCC)    a. 11/2013 Echo: EF 55-60%, basal inf HK, sev dil LA, no evidence of HCM.  PASP 60mmHg.  [name]. Hyponatremia   . Hyposmolality   . Osteoarthritis   . Oxygen dependent    "3L 24/7" (08/05/2013)  . Pacemaker    a. 04/2012 MDT [id number] [employer] DC PPM, ser #: [id number] H.  . Pulmonary HTN    a. has had prior right heart cath in 2010; was felt that most likely due to elevated left sided pressures and would not benefit from vasodilator therapy;  b. 11/2013 Echo: PASP 60mmHg.  . Situational mixed anxiety and depressive disorder   . TIA (transient ischemic attack)   . Type II diabetes mellitus (HCC)   . Varicose veins    Past Surgical History:  Past Surgical History:  Procedure Laterality Date  . ABDOMINAL HYSTERECTOMY     "partial" (08/05/2013)  . APPENDECTOMY    . BACK SURGERY     "bone spur removed; mid back" (08/05/2013)  . CARDIAC CATHETERIZATION      "more than once" (08/05/2013)  . DILATION AND CURETTAGE OF UTERUS     "several" (08/05/2013)  . FOOT NEUROMA SURGERY Right   . INSERT / REPLACE / REMOVE PACEMAKER  1998; 2000's; 2014  . PACEMAKER GENERATOR CHANGE N/A 04/24/2012   Procedure: PACEMAKER GENERATOR CHANGE;  Surgeon: Duke SalviaSteven C Klein, MD;  [facility];  Service: Cardiovascular;  Laterality: N/A;  . TONSILLECTOMY    . TOTAL KNEE ARTHROPLASTY Right 2011   HPI:  [name] [name] is a 80 y.o. woman with a history of dementia, chronic atrial fibrillation (CHADS-Vasc score of at least 5, no longer anticoagulated due to recurrent falls and bleeding risk), permanent pacemaker implant, HTN, DM, CHF, COPD, and prior CVA one year ago who was referred to the ED via EMS for evaluation of acute mental status changes, facial droop with right gaze preference, and garbled speech in the setting of elevated blood pressures.  CT scan still negative for new CVA at time of eval.        Assessment / Plan / Recommendation Clinical Impression  Pt presents with moderate oropharyngeal dysphagia c/b decreased bolus manipulation, significant oral prep time and residue in left buccal cavity with immediate coughing on soft solids. Pt with discoordinated lingual movements. Trials  of puree were effectively cleared without any overt s/s of aspiration. Pt with delayed swallow initiation with thin liquids via spoon, cup and straw revealing immediate coughing. Pt consume nectar thick liquids with mild swallow dealy but no overt s/s of aspiration. Pt unable to follow compensatory swallow directions. Given the above, ST recommends dysphagia 1 diet with nectar thick liquids. Education provied to [employer] and nursing. ST to follow during current hospitalization.     Aspiration Risk  Moderate aspiration risk    Diet Recommendation Dysphagia 1 (Puree);Nectar-thick liquid   Liquid Administration via: Straw Medication Administration: Crushed with puree Supervision: Full  supervision/cueing for compensatory strategies;Staff to assist with self feeding Compensations: Minimize environmental distractions;Slow rate;Small sips/bites;Follow solids with liquid Postural Changes: Seated upright at 90 degrees    Other  Recommendations Oral Care Recommendations: Oral care BID Other Recommendations: Order thickener from pharmacy;Remove water pitcher   Follow up Recommendations Skilled Nursing facility      Frequency and Duration min 2x/week  2 weeks       Prognosis Prognosis for Safe Diet Advancement: Guarded Barriers to Reach Goals: Cognitive deficits;Severity of deficits      Swallow Study   General Date of Onset: 09/16/16 HPI: [name] [name] is a 80 y.o. woman with a history of dementia, chronic atrial fibrillation (CHADS-Vasc score of at least 5, no longer anticoagulated due to recurrent falls and bleeding risk), permanent pacemaker implant, HTN, DM, CHF, COPD, and prior CVA one year ago who was referred to the ED via EMS for evaluation of acute mental status changes, facial droop with right gaze preference, and garbled speech in the setting of elevated blood pressures.  CT scan still negative for new CVA at time of eval.      Type of Study: Bedside Swallow Evaluation Previous Swallow Assessment: 02/02/2016 - recommend regular with thin liquids Diet Prior to this Study: Regular;Thin liquids Temperature Spikes Noted: No Respiratory Status: Room air History of Recent Intubation: No Behavior/Cognition: Alert;Doesn't follow directions;Requires cueing;Confused Oral Cavity Assessment: Within Functional Limits Oral Care Completed by SLP: No Oral Cavity - Dentition: Dentures, top;Dentures, bottom Vision: Impaired for self-feeding Self-Feeding Abilities: Total assist Patient Positioning: Upright in bed Baseline Vocal Quality: Normal Volitional Cough: Strong Volitional Swallow: Unable to elicit    Oral/Motor/Sensory Function Overall Oral Motor/Sensory Function:  Moderate impairment Facial ROM: Reduced left Facial Symmetry: Abnormal symmetry left Facial Strength: Reduced left Facial Sensation: Reduced left Lingual ROM: Reduced left Lingual Symmetry: Abnormal symmetry left Lingual Strength: Reduced Lingual Sensation: Reduced Velum: Within Functional Limits Mandible: Impaired   Ice Chips Ice chips: Impaired Presentation: Spoon Oral Phase Impairments: Reduced labial seal;Reduced lingual movement/coordination Pharyngeal Phase Impairments: Suspected delayed Swallow;Cough - Immediate   Thin Liquid Thin Liquid: Impaired Presentation: Spoon;Straw;Cup Oral Phase Impairments: Reduced labial seal;Reduced lingual movement/coordination Pharyngeal  Phase Impairments: Suspected delayed Swallow;Cough - Delayed;Throat Clearing - Delayed    Nectar Thick Nectar Thick Liquid: Within functional limits Presentation: Cup;Straw   Honey Thick Honey Thick Liquid: Not tested   Puree Puree: Within functional limits Presentation: Spoon   Solid   GO   Solid: Impaired Presentation: Spoon Oral Phase Impairments: Reduced labial seal;Reduced lingual movement/coordination;Poor awareness of bolus Oral Phase Functional Implications: Left lateral sulci pocketing;Prolonged oral transit;Impaired mastication;Oral residue Pharyngeal Phase Impairments: Cough - Immediate       Novalyn Lajara B. [name], M.S., CCC-SLP Speech-Language Pathologist  Ronne Stefanski 09/17/2016,5:41 PM

## 2016-09-17 NOTE — Evaluation (Signed)
Physical Therapy Evaluation Patient Details Name: [name] [name] MRN: [id number] DOB: 10/11/1936 Today's Date: 09/17/2016   History of Present Illness  [name] [name] is a 80 y.o. woman with a history of dementia, chronic atrial fibrillation (CHADS-Vasc score of at least 5, no longer anticoagulated due to recurrent falls and bleeding risk), permanent pacemaker implant, HTN, DM, CHF, COPD, and prior CVA one year ago who was referred to the ED via EMS for evaluation of acute mental status changes, facial droop with right gaze preference, and garbled speech in the setting of elevated blood pressures.  CT scan still negative for new CVA at time of eval.       Clinical Impression  Pt near baseline. Pt required assist for all transfers and ADLs PTA. Family prefers no skilled therapy upon d/c due to pt's with dementia and therapy improving her strength to where she thinks she can get up on her own. Acute PT to con't to follow pt.    Follow Up Recommendations SNF (family prefers LTC not skilled)    Equipment Recommendations  None recommended by PT    Recommendations for Other Services       Precautions / Restrictions Precautions Precautions: Fall Precaution Comments: history of moderate dementia Restrictions Weight Bearing Restrictions: No      Mobility  Bed Mobility Overal bed mobility: Needs Assistance Bed Mobility: Supine to Sit     Supine to sit: Max assist     General bed mobility comments: pt initiated pushing up with UEs  Transfers Overall transfer level: Needs assistance Equipment used: 1 person hand held assist Transfers: Sit to/from Stand Sit to Stand: Max assist Stand pivot transfers: Max assist       General transfer comment: Pt with slight posterior lean in standing and resistance to weightshifting or stepping to the left.   Ambulation/Gait                Stairs            Wheelchair Mobility    Modified Rankin (Stroke Patients Only)       Balance  Overall balance assessment: Needs assistance Sitting-balance support: Feet supported;Bilateral upper extremity supported Sitting balance-Leahy Scale: Poor Sitting balance - Comments: pt requires minA to maintain EOB balance. pt trying to return to supine   Standing balance support: Bilateral upper extremity supported Standing balance-Leahy Scale: Poor                               Pertinent Vitals/Pain Pain Assessment: Faces Faces Pain Scale: No hurt    Home Living Family/patient expects to be discharged to:: Skilled nursing facility [facility]                 Additional Comments:  [facility]    Prior Function Level of Independence: Needs assistance   Gait / Transfers Assistance Needed: Pt needing assist for all transfers prior to admission mod assist overall.   ADL's / Homemaking Assistance Needed: Needs help with dressing, bathing, toileting.   Comments: Per notes, pt's daughter stated that she needed help to transfer in/out of the w/c at baseline and has been uanble to walk except for a short distance to teh bathroom.  Pt unable to verbally respond to therapist questions.  . pt resides in geri chair most of the day per dtr     Hand Dominance   Dominant Hand: Right  Extremity/Trunk Assessment   Upper Extremity Assessment: Difficult to assess due to impaired cognition (pt did move bilat UEs voluntarily)           Lower Extremity Assessment: Difficult to assess due to impaired cognition (pt moved bilat LEs voluntarily)      Cervical / Trunk Assessment: Kyphotic  Communication   Communication: HOH;Expressive difficulties  Cognition Arousal/Alertness: Awake/alert Behavior During Therapy: Flat affect Overall Cognitive Status: History of cognitive impairments - at baseline (Pt only stating the name of her daughter when asked with dysarthric speech noted.  ) Area of Impairment: Orientation;Attention;Memory;Following  commands Orientation Level: Disoriented to;Person;Place;Time;Situation Current Attention Level: Focused   Following Commands: Follows one step commands inconsistently       General Comments: Pt was able to follow commands for brushing her hair and washing her face when items were placed in her hand as instructional cueing was given, but with decreased thoroughness.      General Comments      Exercises     Assessment/Plan    PT Assessment Patient needs continued PT services  PT Problem List Decreased strength;Decreased activity tolerance;Decreased balance;Decreased mobility          PT Treatment Interventions DME instruction;Gait training;Stair training;Functional mobility training;Therapeutic activities;Therapeutic exercise    PT Goals (Current goals can be found in the Care Plan section)  Acute Rehab PT Goals Patient Stated Goal: Pt unable to state.  her daughter wants her to return to SNF for rehab. PT Goal Formulation: With patient/family Time For Goal Achievement: 09/24/16 Potential to Achieve Goals: Fair    Frequency Min 3X/week   Barriers to discharge        Co-evaluation               End of Session Equipment Utilized During Treatment: Gait belt Activity Tolerance: Patient tolerated treatment well Patient left: in chair;with call bell/phone within reach;with chair alarm set Nurse Communication: Mobility status         Time: 1415-1447 PT Time Calculation (min) (ACUTE ONLY): 32 min   Charges:   PT Evaluation $PT Eval Moderate Complexity: 1 Procedure PT Treatments $Therapeutic Activity: 8-22 mins   PT G CodesMarcene Brawn:        Makaylie Dedeaux Marie 09/17/2016, 4:46 PM   Lewis ShockAshly Ammy Lienhard, PT, DPT Pager #: [phone] Office #: [phone]

## 2016-09-17 NOTE — Evaluation (Signed)
Occupational Therapy Evaluation Patient Details Name: [name] [name] Gaines MRN: [id number] DOB: 02/13/1936 Today's Date: 09/17/2016    History of Present Illness (P) [name] [name] Dusek is a 80 y.o. woman with a history of dementia, chronic atrial fibrillation (CHADS-Vasc score of at least 5, no longer anticoagulated due to recurrent falls and bleeding risk), permanent pacemaker implant, HTN, DM, CHF, COPD, and prior CVA one year ago who was referred to the ED via EMS for evaluation of acute mental status changes, facial droop with right gaze preference, and garbled speech in the setting of elevated blood pressures.  CT scan still negative for new CVA at time of eval.        Clinical Impression   Pt with delayed and decreased processing of commands related to selfcare tasks.  When item was placed in her hand and she was instructed to wash her face or comb her hair, she was able to initiate it then but with decreased thoroughness to follow through with it.  Total assist for feeding with max assist for stand pivot transfers to EOB from bedside chair and from 3:1.  Feel pt will benefit from trial OT to help increase active participation in basic selfcare tasks with return back [facility] for continued rehab.     Follow Up Recommendations  SNF    Equipment Recommendations  None recommended by OT    Recommendations for Other Services       Precautions / Restrictions Precautions Precautions: (P) Fall Precaution Comments: (P) history of moderate dementia Restrictions Weight Bearing Restrictions: (P) No      Mobility Bed Mobility Overal bed mobility: (P) Needs Assistance Bed Mobility: (P) Supine to Sit     Supine to sit: (P) Max assist     General bed mobility comments: (P) pt initiated pushing up with UEs  Transfers Overall transfer level: (P) Needs assistance Equipment used: (P) 1 person hand held assist Transfers: (P) Sit to/from Stand Sit to Stand: (P) Max assist Stand pivot transfers:  (P) Max assist       General transfer comment: (P) Pt with slight posterior lean in standing and resistance to weightshifting or stepping to the left.     Balance Overall balance assessment: (P) Needs assistance   Sitting balance-Leahy Scale: (P) Fair       Standing balance-Leahy Scale: (P) Poor                              ADL Overall ADL's : Needs assistance/impaired Eating/Feeding: Total assistance   Grooming: Wash/dry hands;Wash/dry face;Brushing hair;Maximal assistance                   Toilet Transfer: Maximal assistance;BSC;Stand-pivot           Functional mobility during ADLs: Maximal assistance General ADL Comments: Pt with decreased thoroughness with attempted grooming tasks.  Unable to efficiently move her feet during stand pivot transfers to the left.  Noted right gaze preference with inability to cross midline.  Pt's daughter reports pt needed max assist for most dressing tasks at SNF level prior to admission but could transfer to the toilet with mod assist.       Vision Vision Assessment?: Vision impaired- to be further tested in functional context (Pt unable to follow commands for testing) Additional Comments: Pt demonstrates right gaze preference with scanning only to midline on 1-2 occasions, but did not cross midline.     Perception  Perception Perception Tested?: No   Praxis Praxis Praxis tested?: Not tested    Pertinent Vitals/Pain Pain Assessment: (P) Faces Faces Pain Scale: (P) No hurt     Hand Dominance (P) Right   Extremity/Trunk Assessment Upper Extremity Assessment Upper Extremity Assessment: (P) Difficult to assess due to impaired cognition (pt did move bilat UEs voluntarily)   Lower Extremity Assessment Lower Extremity Assessment: (P) Difficult to assess due to impaired cognition (pt moved bilat LEs voluntarily)   Cervical / Trunk Assessment Cervical / Trunk Assessment: (P) Kyphotic   Communication  Communication Communication: (P) HOH;Expressive difficulties   Cognition Arousal/Alertness: (P) Awake/alert Behavior During Therapy: (P) Flat affect Overall Cognitive Status: (P) History of cognitive impairments - at baseline (Pt only stating the name of her daughter when asked with dysarthric speech noted.  ) Area of Impairment: (P) Orientation;Attention;Memory;Following commands Orientation Level: (P) Disoriented to;Person;Place;Time;Situation Current Attention Level: (P) Focused   Following Commands: (P) Follows one step commands inconsistently       General Comments: (P) Pt was able to follow commands for brushing her hair and washing her face when items were placed in her hand as instructional cueing was given, but with decreased thoroughness.     General Comments               Home Living Family/patient expects to be discharged to:: (P) Skilled nursing facility [facility]                                 Additional Comments: (P)  ([facility]      Prior Functioning/Environment Level of Independence: (P) Needs assistance  Gait / Transfers Assistance Needed: (P) Pt needing assist for all transfers prior to admission mod assist overall.  ADL's / Homemaking Assistance Needed: (P) Needs help with dressing, bathing, toileting.    Comments: (P) Per notes, pt's daughter stated that she needed help to transfer in/out of the w/c at baseline and has been uanble to walk except for a short distance to teh bathroom.  Pt unable to verbally respond to therapist questions.  . pt resides in geri chair most of the day per dtr        OT Problem List: Decreased strength;Impaired balance (sitting and/or standing);Decreased cognition;Decreased safety awareness;Impaired vision/perception;Decreased knowledge of use of DME or AE   OT Treatment/Interventions: Self-care/ADL training;Visual/perceptual remediation/compensation;Neuromuscular  education;Therapeutic exercise;Balance training;Therapeutic activities;DME and/or AE instruction;Patient/family education    OT Goals(Current goals can be found in the care plan section) Acute Rehab OT Goals Patient Stated Goal: Pt unable to state.  her daughter wants her to return to SNF for rehab. OT Goal Formulation: With patient/family Time For Goal Achievement: 10/01/16 Potential to Achieve Goals: Fair  OT Frequency: Min 2X/week              End of Session Nurse Communication: Mobility status  Activity Tolerance: Patient tolerated treatment well Patient left: in chair;with chair alarm set   Time: [id number] OT Time Calculation (min): 44 min Charges:  OT General Charges $OT Visit: 1 Procedure OT Evaluation $OT Eval Moderate Complexity: 1 Procedure OT Treatments $Self Care/Home Management : 23-37 mins  Merik Mignano OTR/[name] 09/17/2016, 4:44 PM

## 2016-09-17 NOTE — Progress Notes (Signed)
EEG completed; results pending.    

## 2016-09-17 NOTE — Progress Notes (Signed)
PROGRESS NOTE                                                                                                                                                                                                             Patient Demographics:    [name] [name], is a 80 y.o. female, DOB - 1936/04/30, ZOX:[id number]  Admit date - 09/16/2016   Admitting Physician Michael Litter, MD  Outpatient Primary MD for the patient is [name] Patient  LOS - 1   Chief Complaint  Patient presents with  . Code Stroke       Brief Narrative   80 y.o.femalewith past medical history of of atrial fibrillation, pacemaker , recent left MCA stroke in March 2017, who presents with a rightward gaze deviation and left sided weakness She has residual dysphasia from her prior left MCA stroke. Then anticoagulation in the past secondary to fall risk given. Diagnoses of dementia, she is on Plavix for stroke prevention.   Subjective:    [name] [name] today Nonverbal, noncommunicative, cannot give any complaints   Assessment  & Plan :    Principal Problem:   Stroke [facility]) Active Problems:   Essential hypertension   Atrial fibrillation (HCC)   Pacemaker   COPD (chronic obstructive pulmonary disease) (HCC)   Diabetes (HCC)   Dementia   Cerebrovascular accident (CVA) due to embolism of left middle cerebral artery (HCC)    clinical suspicion for stroke - MRI could not be obtained given patient has permanent pacemaker. - Neurology appreciated, initial CTA head and neck without evidence of acute CVA, plan to repeat CT head in a.m. - EKG was obtained to rule out seizure, significant for encephalopathy, but no evidence of epileptiform activities,. - LDL  207 , has allergy to statins - Follow on 2-D echo - Patient with known history of A. fib, a daughter in the past did not agree to full anticoagulation given she is a high fall risk, continue with Plavix(has aspirin  allergy)  Hypertension - Uncontrolled, allow For permissive hypertension  Atrial fibrillation - Anticoagulation secondary to high risk fall, continue with Plavix - Continue with Cardizem  Hyperlipidemia - LDL is 207, has allergy to statin  Diabetes mellitus - Hemoglobin A1c, continue with insulin sliding scale  Code Status : DNR  Family Communication  : D/W daughter at bedside  Disposition Plan  : Back to SNF in am  Consults  :  Neuro  Procedures  : EEG  DVT Prophylaxis  :   SCDs   Lab Results  Component Value Date   PLT 194 09/16/2016    Antibiotics  :   Anti-infectives    None        Objective:   Vitals:   09/17/16 0917 09/17/16 0948 09/17/16 1122 09/17/16 1331  BP: (!) 179/95  (!) 151/79 (!) 183/77  Pulse: 60 80 63 74  Resp: 20 18 18 18   Temp: 98 F (36.7 C) 97.7 F (36.5 C) 97.7 F (36.5 C) 97.7 F (36.5 C)  TempSrc: Oral Axillary Oral Axillary  SpO2: 99% 96% 98% 99%  Weight:      Height:        Wt Readings from Last 3 Encounters:  09/16/16 55.5 kg (122 lb 5.7 oz)  07/22/16 58.2 kg (128 lb 4.8 oz)  06/05/16 54 kg (119 lb 1.6 oz)     Intake/Output Summary (Last 24 hours) at 09/17/16 1416 Last data filed at 09/17/16 0900  Gross per 24 hour  Intake              120 ml  Output                0 ml  Net              120 ml     Physical Exam  Awake, Noncommunicative, does not follow commands Supple Neck,No JVD, rightward gaze deviation Symmetrical Chest wall movement, Good air movement bilaterally, CTAB RRR,(paced on tele)No Gallops,Rubs or new Murmurs, No Parasternal Heave +ve B.Sounds, Abd Soft, No tenderness,  No Cyanosis, Clubbing or edema, No new Rash or bruise      Data Review:    CBC  Recent Labs Lab 09/16/16 2056 09/16/16 2103  WBC  --  5.6  HGB 14.6 15.0  HCT 43.0 44.2  PLT  --  194  MCV  --  98.0  MCH  --  33.3  MCHC  --  33.9  RDW  --  12.8  LYMPHSABS  --  1.6  MONOABS  --  0.4  EOSABS  --  0.2  BASOSABS   --  0.0    Chemistries   Recent Labs Lab 09/16/16 2056 09/16/16 2103  NA 142 140  K 4.1 4.1  CL 107 105  CO2  --  25  GLUCOSE 116* 126*  BUN 32* 26*  CREATININE 1.00 1.15*  CALCIUM  --  9.9  AST  --  29  ALT  --  14  ALKPHOS  --  84  BILITOT  --  1.0   ------------------------------------------------------------------------------------------------------------------  Recent Labs  09/17/16 0634  CHOL 288*  HDL 43  LDLCALC 207*  TRIG 190*  CHOLHDL 6.7    Lab Results  Component Value Date   HGBA1C 5.4 02/02/2016   ------------------------------------------------------------------------------------------------------------------ No results for input(s): TSH, T4TOTAL, T3FREE, THYROIDAB in the last 72 hours.  Invalid input(s): FREET3 ------------------------------------------------------------------------------------------------------------------ No results for input(s): VITAMINB12, FOLATE, FERRITIN, TIBC, IRON, RETICCTPCT in the last 72 hours.  Coagulation profile  Recent Labs Lab 09/16/16 2103  INR 1.05    No results for input(s): DDIMER in the last 72 hours.  Cardiac Enzymes No results for input(s): CKMB, TROPONINI, MYOGLOBIN in the last 168 hours.  Invalid input(s): CK ------------------------------------------------------------------------------------------------------------------ No results found for: BNP  Inpatient Medications  Scheduled Meds: . clopidogrel  75 mg Oral Daily  .  diltiazem  180 mg Oral q morning - 10a  . divalproex  250 mg Oral BID  . insulin aspart  0-9 Units Subcutaneous TID WC  . oxybutynin  15 mg Oral q morning - 10a   Continuous Infusions: PRN Meds:.acetaminophen **OR** acetaminophen  Micro Results Recent Results (from the past 240 hour(s))  MRSA PCR Screening     Status: None   Collection Time: 09/17/16  6:32 AM  Result Value Ref Range Status   MRSA by PCR NEGATIVE NEGATIVE Final    Comment:        The GeneXpert MRSA  Assay (FDA approved for NASAL specimens only), is one component of a comprehensive MRSA colonization surveillance program. It is not intended to diagnose MRSA infection nor to guide or monitor treatment for MRSA infections.     Radiology Reports Ct Angio Head W Or Wo Contrast  Result Date: 09/16/2016 CLINICAL DATA:  80 y/o F; left-sided weakness with concern for acute stroke. EXAM: CT ANGIOGRAPHY HEAD AND NECK CT PERFUSION WITH CONTRAST TECHNIQUE: Multidetector CT imaging of the head and neck was performed using the standard protocol during bolus administration of intravenous contrast. Multiplanar CT image reconstructions and MIPs were obtained to evaluate the vascular anatomy. Carotid stenosis measurements (when applicable) are obtained utilizing NASCET criteria, using the distal internal carotid diameter as the denominator. CT perfusion of the head was performed with automatic post processing, segmentation, and quantification by RAPID software. CONTRAST:  90 cc Isovue 370 COMPARISON:  None. FINDINGS: CTA NECK FINDINGS Aortic arch: Standard branching. Imaged portion shows no evidence of aneurysm or dissection. Aortic atherosclerosis with mixed calcified and fibro fatty plaque. Partially visualized pacemaker device and leads in the left subclavian vein and upper SVC. 50% stenosis of the origin of left subclavian artery. Right carotid system: No evidence of dissection, stenosis (50% or greater) or occlusion. Multiple levels of mixed plaque of both the common and internal carotid segments with multiple areas of mild approximately 30% stenosis. Left carotid system: Left common carotid artery proximal fibro fatty plaque with 50% moderate stenosis (series 501, image 143). There additional common carotid artery plaques with minimal to mild stenosis and small plaque ulcerations just proximal to the bifurcation. Mixed plaque at the bifurcation with proximal ICA moderate 60% stenosis (series 501, image 96).  Vertebral arteries: Codominant. No evidence of dissection, stenosis (50% or greater) or occlusion. Skeleton: Degenerative changes of the cervical spine without high-grade canal stenosis. Other neck: No mass, lymphadenopathy, or discrete inflammatory process of the neck identified. Upper chest: Peripheral fibrotic changes of the lung apices. Multiple areas of mucous plugging and airway thickening with scattered tiny nodules consistent with bronchiolitis. Review of the MIP images confirms the above findings CTA HEAD FINDINGS No aneurysm of circle of Willis identified. Anterior circulation: Extensive calcific atherosclerosis of cavernous and paraclinoid segments with moderate to severe right distal cavernous and moderate left supraclinoid internal carotid artery stenosis (series 503, image 80 and 85). Right M1 segment short segment of moderate to severe stenosis (series 503, image 87). Multiple additional areas of mild-to-moderate stenosis of the distal right MCA circulation. No significant stenosis of the left M1 segment. Multiple areas of mild-to-moderate stenosis in the distal left MCA circulation. No significant stenosis of bilateral A1 segments. Irregularity of the to approximately A3 segments with several short segments of moderate severe stenosis. Posterior circulation: Right greater than left distal P1 segment severe stenosis. Multiple areas of vessel irregularity and mild-to-moderate stenosis of distal PCA segments bilaterally. Two segments of mid  and proximal basilar mild stenosis. Right vertebral artery mixed plaque with severe 90% stenosis/near occlusion (series 503, image 117). Left vertebral artery calcified plaque with moderate 50% stenosis (503:120). Venous sinuses: As permitted by contrast timing, patent. Anatomic variants: No posterior communicating artery identified, likely hypoplastic or absent. Small anterior communicating artery. Review of the MIP images confirms the above findings CT PERFUSION  FINDINGS No significant rotational or translational motion of the patient. Adequate quality arterial inflow and venous outflow functions. Cerebral blood flow less than 30% volume: 0 cc T-max greater than 6 seconds volume: 9 cc (probably artifactual confined to the peripheries of cerebellum) T-max greater than 4 seconds volume: 94 cc in a right-sided watershed distribution. IMPRESSION: 1. No significant core infarct or ischemic penumbra on CT perfusion. 2. Right cerebral watershed territory increased T-max likely due to severe atherosclerotic disease in both the right ICA, right ACA, and right M1 segments. 3. No large vessel occlusion of the circle of Willis greatest in the right ICA distribution but advanced in all vascular territories. 4. Moderate stenosis plaques of the proximal left common carotid artery and left internal carotid artery. Otherwise no high-grade stenosis, dissection, or aneurysm of the carotid and vertebral arteries of the neck. These results were called by telephone at the time of interpretation on 09/16/2016 at 10:05 pm to Dr. Otelia Limes, who verbally acknowledged these results. Electronically Signed   By: Mitzi Hansen M.D.   On: 09/16/2016 22:15   Ct Angio Neck W And/or Wo Contrast  Result Date: 09/16/2016 CLINICAL DATA:  80 y/o F; left-sided weakness with concern for acute stroke. EXAM: CT ANGIOGRAPHY HEAD AND NECK CT PERFUSION WITH CONTRAST TECHNIQUE: Multidetector CT imaging of the head and neck was performed using the standard protocol during bolus administration of intravenous contrast. Multiplanar CT image reconstructions and MIPs were obtained to evaluate the vascular anatomy. Carotid stenosis measurements (when applicable) are obtained utilizing NASCET criteria, using the distal internal carotid diameter as the denominator. CT perfusion of the head was performed with automatic post processing, segmentation, and quantification by RAPID software. CONTRAST:  90 cc Isovue 370  COMPARISON:  None. FINDINGS: CTA NECK FINDINGS Aortic arch: Standard branching. Imaged portion shows no evidence of aneurysm or dissection. Aortic atherosclerosis with mixed calcified and fibro fatty plaque. Partially visualized pacemaker device and leads in the left subclavian vein and upper SVC. 50% stenosis of the origin of left subclavian artery. Right carotid system: No evidence of dissection, stenosis (50% or greater) or occlusion. Multiple levels of mixed plaque of both the common and internal carotid segments with multiple areas of mild approximately 30% stenosis. Left carotid system: Left common carotid artery proximal fibro fatty plaque with 50% moderate stenosis (series 501, image 143). There additional common carotid artery plaques with minimal to mild stenosis and small plaque ulcerations just proximal to the bifurcation. Mixed plaque at the bifurcation with proximal ICA moderate 60% stenosis (series 501, image 96). Vertebral arteries: Codominant. No evidence of dissection, stenosis (50% or greater) or occlusion. Skeleton: Degenerative changes of the cervical spine without high-grade canal stenosis. Other neck: No mass, lymphadenopathy, or discrete inflammatory process of the neck identified. Upper chest: Peripheral fibrotic changes of the lung apices. Multiple areas of mucous plugging and airway thickening with scattered tiny nodules consistent with bronchiolitis. Review of the MIP images confirms the above findings CTA HEAD FINDINGS No aneurysm of circle of Willis identified. Anterior circulation: Extensive calcific atherosclerosis of cavernous and paraclinoid segments with moderate to severe right distal cavernous and moderate  left supraclinoid internal carotid artery stenosis (series 503, image 80 and 85). Right M1 segment short segment of moderate to severe stenosis (series 503, image 87). Multiple additional areas of mild-to-moderate stenosis of the distal right MCA circulation. No significant  stenosis of the left M1 segment. Multiple areas of mild-to-moderate stenosis in the distal left MCA circulation. No significant stenosis of bilateral A1 segments. Irregularity of the to approximately A3 segments with several short segments of moderate severe stenosis. Posterior circulation: Right greater than left distal P1 segment severe stenosis. Multiple areas of vessel irregularity and mild-to-moderate stenosis of distal PCA segments bilaterally. Two segments of mid and proximal basilar mild stenosis. Right vertebral artery mixed plaque with severe 90% stenosis/near occlusion (series 503, image 117). Left vertebral artery calcified plaque with moderate 50% stenosis (503:120). Venous sinuses: As permitted by contrast timing, patent. Anatomic variants: No posterior communicating artery identified, likely hypoplastic or absent. Small anterior communicating artery. Review of the MIP images confirms the above findings CT PERFUSION FINDINGS No significant rotational or translational motion of the patient. Adequate quality arterial inflow and venous outflow functions. Cerebral blood flow less than 30% volume: 0 cc T-max greater than 6 seconds volume: 9 cc (probably artifactual confined to the peripheries of cerebellum) T-max greater than 4 seconds volume: 94 cc in a right-sided watershed distribution. IMPRESSION: 1. No significant core infarct or ischemic penumbra on CT perfusion. 2. Right cerebral watershed territory increased T-max likely due to severe atherosclerotic disease in both the right ICA, right ACA, and right M1 segments. 3. No large vessel occlusion of the circle of Willis greatest in the right ICA distribution but advanced in all vascular territories. 4. Moderate stenosis plaques of the proximal left common carotid artery and left internal carotid artery. Otherwise no high-grade stenosis, dissection, or aneurysm of the carotid and vertebral arteries of the neck. These results were called by telephone at  the time of interpretation on 09/16/2016 at 10:05 pm to Dr. Otelia Limes, who verbally acknowledged these results. Electronically Signed   By: Mitzi Hansen M.D.   On: 09/16/2016 22:15   Ct Cerebral Perfusion W Contrast  Result Date: 09/16/2016 CLINICAL DATA:  80 y/o F; left-sided weakness with concern for acute stroke. EXAM: CT ANGIOGRAPHY HEAD AND NECK CT PERFUSION WITH CONTRAST TECHNIQUE: Multidetector CT imaging of the head and neck was performed using the standard protocol during bolus administration of intravenous contrast. Multiplanar CT image reconstructions and MIPs were obtained to evaluate the vascular anatomy. Carotid stenosis measurements (when applicable) are obtained utilizing NASCET criteria, using the distal internal carotid diameter as the denominator. CT perfusion of the head was performed with automatic post processing, segmentation, and quantification by RAPID software. CONTRAST:  90 cc Isovue 370 COMPARISON:  None. FINDINGS: CTA NECK FINDINGS Aortic arch: Standard branching. Imaged portion shows no evidence of aneurysm or dissection. Aortic atherosclerosis with mixed calcified and fibro fatty plaque. Partially visualized pacemaker device and leads in the left subclavian vein and upper SVC. 50% stenosis of the origin of left subclavian artery. Right carotid system: No evidence of dissection, stenosis (50% or greater) or occlusion. Multiple levels of mixed plaque of both the common and internal carotid segments with multiple areas of mild approximately 30% stenosis. Left carotid system: Left common carotid artery proximal fibro fatty plaque with 50% moderate stenosis (series 501, image 143). There additional common carotid artery plaques with minimal to mild stenosis and small plaque ulcerations just proximal to the bifurcation. Mixed plaque at the bifurcation with proximal ICA moderate  60% stenosis (series 501, image 96). Vertebral arteries: Codominant. No evidence of dissection,  stenosis (50% or greater) or occlusion. Skeleton: Degenerative changes of the cervical spine without high-grade canal stenosis. Other neck: No mass, lymphadenopathy, or discrete inflammatory process of the neck identified. Upper chest: Peripheral fibrotic changes of the lung apices. Multiple areas of mucous plugging and airway thickening with scattered tiny nodules consistent with bronchiolitis. Review of the MIP images confirms the above findings CTA HEAD FINDINGS No aneurysm of circle of Willis identified. Anterior circulation: Extensive calcific atherosclerosis of cavernous and paraclinoid segments with moderate to severe right distal cavernous and moderate left supraclinoid internal carotid artery stenosis (series 503, image 80 and 85). Right M1 segment short segment of moderate to severe stenosis (series 503, image 87). Multiple additional areas of mild-to-moderate stenosis of the distal right MCA circulation. No significant stenosis of the left M1 segment. Multiple areas of mild-to-moderate stenosis in the distal left MCA circulation. No significant stenosis of bilateral A1 segments. Irregularity of the to approximately A3 segments with several short segments of moderate severe stenosis. Posterior circulation: Right greater than left distal P1 segment severe stenosis. Multiple areas of vessel irregularity and mild-to-moderate stenosis of distal PCA segments bilaterally. Two segments of mid and proximal basilar mild stenosis. Right vertebral artery mixed plaque with severe 90% stenosis/near occlusion (series 503, image 117). Left vertebral artery calcified plaque with moderate 50% stenosis (503:120). Venous sinuses: As permitted by contrast timing, patent. Anatomic variants: No posterior communicating artery identified, likely hypoplastic or absent. Small anterior communicating artery. Review of the MIP images confirms the above findings CT PERFUSION FINDINGS No significant rotational or translational motion of  the patient. Adequate quality arterial inflow and venous outflow functions. Cerebral blood flow less than 30% volume: 0 cc T-max greater than 6 seconds volume: 9 cc (probably artifactual confined to the peripheries of cerebellum) T-max greater than 4 seconds volume: 94 cc in a right-sided watershed distribution. IMPRESSION: 1. No significant core infarct or ischemic penumbra on CT perfusion. 2. Right cerebral watershed territory increased T-max likely due to severe atherosclerotic disease in both the right ICA, right ACA, and right M1 segments. 3. No large vessel occlusion of the circle of Willis greatest in the right ICA distribution but advanced in all vascular territories. 4. Moderate stenosis plaques of the proximal left common carotid artery and left internal carotid artery. Otherwise no high-grade stenosis, dissection, or aneurysm of the carotid and vertebral arteries of the neck. These results were called by telephone at the time of interpretation on 09/16/2016 at 10:05 pm to Dr. Otelia LimesLindzen, who verbally acknowledged these results. Electronically Signed   By: Mitzi HansenLance  Furusawa-Stratton M.D.   On: 09/16/2016 22:15   Ct Head Code Stroke W/o Cm  Result Date: 09/16/2016 CLINICAL DATA:  Code stroke.  Patient not moving her left side. EXAM: CT HEAD WITHOUT CONTRAST TECHNIQUE: Contiguous axial images were obtained from the base of the skull through the vertex without intravenous contrast. COMPARISON:  06/05/2016 CT head. FINDINGS: Brain: No evidence of large acute territory infarct, focal mass effect, or intracranial hemorrhage. Stable chronic left parietal infarct. Stable right putamen, right caudate head, and right anterior insula chronic lacunar infarcts. Stable small chronic infarct within the right cerebellar hemisphere. Stable background of exam chronic microvascular ischemic changes and moderate parenchymal volume loss. No extra-axial collection identified. Nonspecific right parietal cortical calcifications are  unchanged. Vascular: Calcific atherosclerosis of the cavernous internal carotid arteries and vertebral arteries. Skull: Normal. Negative for fracture or focal lesion.  Sinuses/Orbits: Right frontal sinus mucous retention cyst. Otherwise the visualized paranasal sinuses and mastoids are clear. Other: None. ASPECTS [facility] Stroke Program Early CT Score) - Ganglionic level infarction (caudate, lentiform nuclei, internal capsule, insula, M1-M3 cortex): 7 - Supraganglionic infarction (M4-M6 cortex): 3 Total score (0-10 with 10 being normal): 10 IMPRESSION: 1. No evidence of large acute infarct, mass effect, or intracranial hemorrhage and symptoms persist or if clinically indicated MRI is more sensitive for acute stroke. 2. ASPECTS is 10 3. Stable background of small chronic infarcts, advanced chronic microvascular ischemic changes, and moderate parenchymal volume loss. These results were called by telephone at the time of interpretation on 09/16/2016 at 9:16 pm to Dr. Chaney Malling , who verbally acknowledged these results. Electronically Signed   By: Mitzi Hansen M.D.   On: 09/16/2016 21:16     Haislee Corso M.D on 09/17/2016 at 2:16 PM  Between 7am to 7pm - Pager - [phone]  After 7pm go to [email or web address] - password [facility]  [facility] -  Office  [phone]

## 2016-09-17 NOTE — Procedures (Signed)
ELECTROENCEPHALOGRAM REPORT  Date of Study: 09/17/2016  Patient's Name: [name] [name] MRN: [id number] Date of Birth: 1936/06/21  Referring Provider: Dr. Marvel PlanJindong Xu  Clinical History: This is a 80 year old woman with acute onset right gaze deviation and left sided weakness.  Medications: acetaminophen (TYLENOL) tablet 650 mg  clopidogrel (PLAVIX) tablet 75 mg  diltiazem (CARDIZEM CD) 24 hr capsule 180 mg  divalproex (DEPAKOTE SPRINKLE) capsule 250 mg  insulin aspart (novoLOG) injection 0-9 Units  oxybutynin (DITROPAN XL) 24 hr tablet 15 mg   Technical Summary: A multichannel digital EEG recording measured by the international 10-20 system with electrodes applied with paste and impedances below 5000 ohms performed as portable with EKG monitoring in an awake and confused patient.  Hyperventilation and photic stimulation were not performed.  The digital EEG was referentially recorded, reformatted, and digitally filtered in a variety of bipolar and referential montages for optimal display.   Description: The patient is awake and confused during the recording.  During maximal wakefulness, there is a poorly sustained low to medium voltage 8 Hz posterior dominant rhythm that poorly attenuates with eye opening and eye closure. This is admixed with a moderate amount of diffuse 4-6 Hz theta and occasional diffuse 2-3 Hz delta slowing of the waking background.  Normal sleep architecture is not seen. Hyperventilation and photic stimulation were not performed.  There were no epileptiform discharges or electrographic seizures seen.    EKG lead showed irregular rhythm.   Impression: This awake EEG is abnormal due to moderate diffuse slowing of the waking background.  Clinical Correlation of the above findings indicates diffuse cerebral dysfunction that is non-specific in etiology and can be seen with hypoxic/ischemic injury, toxic/metabolic encephalopathies, neurodegenerative disorders, or medication  effect.  The absence of epileptiform discharges does not rule out a clinical diagnosis of epilepsy.  Clinical correlation is advised.   Patrcia DollyKaren Venita Seng, M.D.

## 2016-09-18 ENCOUNTER — Inpatient Hospital Stay (HOSPITAL_COMMUNITY): Payer: Medicare Other

## 2016-09-18 DIAGNOSIS — I63412 Cerebral infarction due to embolism of left middle cerebral artery: Principal | ICD-10-CM

## 2016-09-18 DIAGNOSIS — G9340 Encephalopathy, unspecified: Secondary | ICD-10-CM | POA: Diagnosis present

## 2016-09-18 LAB — GLUCOSE, CAPILLARY
Glucose-Capillary: 123 mg/dL — ABNORMAL HIGH (ref 65–99)
Glucose-Capillary: 200 mg/dL — ABNORMAL HIGH (ref 65–99)
Glucose-Capillary: 95 mg/dL (ref 65–99)

## 2016-09-18 LAB — HEMOGLOBIN A1C
Hgb A1c MFr Bld: 5.6 % (ref 4.8–5.6)
MEAN PLASMA GLUCOSE: 114 mg/dL

## 2016-09-18 MED ORDER — STARCH (THICKENING) PO POWD: 1.0000 g | ORAL | 0 refills | Status: DC | PRN

## 2016-09-18 NOTE — NC FL2 (Signed)
[place] MEDICAID FL2 LEVEL OF CARE SCREENING TOOL     IDENTIFICATION  Patient Name: [name] [name] Birthdate: 05/07/1936 Sex: female Admission Date (Current Location): 09/16/2016  [facility]   Facility and Address:  [facility] [place], [address], [place], [place] [address]      Provider Number: [id number]  Attending Physician Name and Address:  Rolly SalterPranav M Patel, MD  Relative Name and Phone Number:       Current Level of Care: Hospital Recommended Level of Care: Skilled Nursing Facility Prior Approval Number:    Date Approved/Denied:   PASRR Number: [id number]  Discharge Plan: SNF    Current Diagnoses: Patient Active Problem List   Diagnosis Date Noted  . Stroke (HCC) 09/16/2016  . Paroxysmal atrial fibrillation (HCC)   . Encounter for palliative care   . Goals of care, counseling/discussion   . Altered mental state   . Cerebrovascular accident (CVA) due to embolism of left middle cerebral artery (HCC)   . Dementia   . Facial asymmetry 02/01/2016  . Altered mental status 02/01/2016  . Closed head injury 05/30/2014  . Pulmonary hypertension, moderate to severe 05/30/2014  . Diabetes (HCC) 05/30/2014  . Hypoglycemia 05/29/2014  . Hypokalemia 05/29/2014  . Subdural hematoma caused by concussion (HCC) 05/28/2014  . hemorrhagic cerebral contusion 05/28/2014  . Dyslipidemia 03/07/2014  . Chronic respiratory failure (HCC) 01/01/2014  . Chest pain   . Compression fracture of L4 lumbar vertebra (HCC) 11/18/2013  . Syncope 11/18/2013  . Protein-calorie malnutrition, severe (HCC) 11/18/2013  . Syncope and collapse 11/17/2013  . Closed compression fracture of L4 lumbar vertebra (HCC) 11/17/2013  . Gait difficulty 10/05/2013  . Fall (on)(from) incline, initial encounter 10/03/2013  . Essential hypertension, benign 10/03/2013  . Unsteady gait 10/03/2013  . Peripheral neuropathy (HCC) 10/03/2013  . Back pain 08/06/2013  .  Near syncope 08/06/2013  . Pulmonary hypertension 08/05/2013  . Abnormality of gait 05/24/2013  . Acute respiratory distress 05/08/2013  . Pulmonary edema, acute (HCC) 05/08/2013  . Benign hypertensive heart disease without heart failure   . Pulmonary HTN   . Oxygen dependent   . COPD (chronic obstructive pulmonary disease) (HCC)   . Fatigue due to cardiopulmonary and deconditioning 04/23/2013  . Memory deficit 02/07/2013  . Memory change 12/11/2012  . TIA (transient ischemic attack) 02/19/2012  . Chest pain at rest 08/05/2011  . Type II or unspecified type diabetes mellitus without mention of complication, uncontrolled 06/12/2011  . Fall 03/22/2011  . Mitral regurgitation 03/13/2011  . Hypertrophic cardiomyopathy (HCC)   . Osteoarthritis   . Situational mixed anxiety and depressive disorder   . Pacemaker 02/26/2011  . Atrial fibrillation (HCC) 01/28/2011  . Mixed hyperlipidemia 05/31/2009  . Essential hypertension 05/31/2009  . ALLERGIC RHINITIS 05/31/2009    Orientation RESPIRATION BLADDER Height & Weight        Normal Incontinent Weight: 122 lb 5.7 oz (55.5 kg) Height:  5' (152.4 cm)  BEHAVIORAL SYMPTOMS/MOOD NEUROLOGICAL BOWEL NUTRITION STATUS      Incontinent Diet (DYS 1, Nectar Thick)  AMBULATORY STATUS COMMUNICATION OF NEEDS Skin   Extensive Assist Verbally Normal                       Personal Care Assistance Level of Assistance  Bathing, Feeding, Dressing Bathing Assistance: Maximum assistance Feeding assistance: Limited assistance Dressing Assistance: Maximum assistance     Functional Limitations Info  Sight, Hearing, Speech Sight Info:  Impaired Hearing Info: Impaired Speech Info: Impaired    SPECIAL CARE FACTORS FREQUENCY                       Contractures Contractures Info: Not present    Additional Factors Info  Code Status, Allergies Code Status Info: DNR Allergies Info: Aspirin, Ativan Lorazepam, Calan Verapamil Hcl, Codeine,  Crestor Rosuvastatin Calcium, Escitalopram Oxalate, Lactose Intolerance (Gi), Lexapro Escitalopram, Lipitor Atorvastatin Calcium, Lopressor Metoprolol Tartrate, Metoprolol Tartrate, Nitroglycerin, Norvasc Amlodipine Besylate, Nsaids, Oxycodone, Percocet Oxycodone-acetaminophen, Prednisone, Sulfa Antibiotics, Tape, Vimovo Naproxen-esomeprazole, Zoloft Sertraline, Penicillins           Current Medications (09/18/2016):  This is the current hospital active medication list Current Facility-Administered Medications  Medication Dose Route Frequency Provider Last Rate Last Dose  . acetaminophen (TYLENOL) tablet 650 mg  650 mg Oral Q4H PRN Michael LitterNikki Carter, MD       Or  . acetaminophen (TYLENOL) suppository 650 mg  650 mg Rectal Q4H PRN Michael LitterNikki Carter, MD      . clopidogrel (PLAVIX) tablet 75 mg  75 mg Oral Daily Michael LitterNikki Carter, MD   75 mg at 09/18/16 1055  . diltiazem (CARDIZEM CD) 24 hr capsule 180 mg  180 mg Oral q morning - 10a Michael LitterNikki Carter, MD   180 mg at 09/18/16 1055  . divalproex (DEPAKOTE SPRINKLE) capsule 250 mg  250 mg Oral BID Michael LitterNikki Carter, MD   250 mg at 09/18/16 1055  . food thickener (THICK IT) powder   Oral PRN Leana Roeawood S Elgergawy, MD      . insulin aspart (novoLOG) injection 0-9 Units  0-9 Units Subcutaneous TID WC Michael LitterNikki Carter, MD   1 Units at 09/18/16 0900  . oxybutynin (DITROPAN XL) 24 hr tablet 15 mg  15 mg Oral q morning - 10a Michael LitterNikki Carter, MD   15 mg at 09/18/16 1055     Discharge Medications: Please see discharge summary for a list of discharge medications.  Relevant Imaging Results:  Relevant Lab Results:   Additional Information SSN:  [id number]  Dede QuerySarah Peighton Edgin, LCSW

## 2016-09-18 NOTE — Progress Notes (Signed)
  Speech Language Pathology Treatment: Dysphagia  Patient Details Name: [name] [name] MRN: [id number] DOB: 06/17/1936 Today's Date: 09/18/2016 Time: [id number] SLP Time Calculation (min) (ACUTE ONLY): 25 min  Assessment / Plan / Recommendation Clinical Impression  Skilled treatment focused on dysphagia goals. SLP facilitated session by providing trials of ice chips. Pt with continued delayed swallow initiation but didn't display any overt s/s of aspiration this session with ice chips. Will defer to receiving SLP for further trials. Pt consumed nectar thick without overt s/s of aspiration. Pt with cotninued decreased lingual manipulation of puree trials but was able to clear when bolus placed on pt's right. Continue dysphagia 1 with nectar thick liquids, medicine crushed in puree.    HPI HPI: [name] [name] is a 80 y.o. woman with a history of dementia, chronic atrial fibrillation (CHADS-Vasc score of at least 5, no longer anticoagulated due to recurrent falls and bleeding risk), permanent pacemaker implant, HTN, DM, CHF, COPD, and prior CVA one year ago who was referred to the ED via EMS for evaluation of acute mental status changes, facial droop with right gaze preference, and garbled speech in the setting of elevated blood pressures.  CT scan still negative for new CVA at time of eval.           SLP Plan  Continue with current plan of care     Recommendations  Diet recommendations: Dysphagia 1 (puree);Nectar-thick liquid Liquids provided via: Teaspoon Medication Administration: Crushed with puree Supervision: Full supervision/cueing for compensatory strategies;Staff to assist with self feeding Compensations: Minimize environmental distractions;Slow rate;Small sips/bites;Follow solids with liquid Postural Changes and/or Swallow Maneuvers: Seated upright 90 degrees                Oral Care Recommendations: Oral care BID Follow up Recommendations: Skilled Nursing facility Plan: Continue with  current plan of care       GO           [name] [name] B. [name] [name], M.S., CCC-SLP Speech-Language Pathologist [phone]   [name] [name] [name] [name] 09/18/2016, 9:57 AM

## 2016-09-18 NOTE — Care Management Note (Signed)
Case Management Note  Patient Details  Name: [name] [name] MRN: [id number] Date of Birth: 07/09/1936  Subjective/Objective:                    Action/Plan: [facility] with [facility] services. No further needs per CM.   Expected Discharge Date:                  Expected Discharge Plan:  Skilled Nursing Facility  In-House Referral:  Clinical Social Work  Discharge planning Services  CM Consult  Post Acute Care Choice:    Choice offered to:     DME Arranged:    DME Agency:     HH Arranged:    HH Agency:     Status of Service:  Completed, signed off  If discussed at [employer] Meetings, dates discussed:    Additional Comments:  Kermit BaloKelli F Kacin Dancy, RN 09/18/2016, 3:05 PM

## 2016-09-18 NOTE — Progress Notes (Signed)
STROKE TEAM PROGRESS NOTE   SUBJECTIVE (INTERVAL HISTORY) No family is at bedside. EEG showed no seizure and repeat CT showed no acute changes. Pt still has right gaze and left neglect. Move all extremities. Pending transfer back to SNF   OBJECTIVE Temp:  [97.5 F (36.4 C)-97.9 F (36.6 C)] 97.9 F (36.6 C) (11/08 1340) Pulse Rate:  [64-78] 64 (11/08 1340) Cardiac Rhythm: Ventricular paced (11/08 0700) Resp:  [18] 18 (11/08 1340) BP: (116-178)/(65-97) 178/66 (11/08 1340) SpO2:  [96 %-100 %] 98 % (11/08 1340)  CBC:   Recent Labs Lab 09/16/16 2056 09/16/16 2103  WBC  --  5.6  NEUTROABS  --  3.4  HGB 14.6 15.0  HCT 43.0 44.2  MCV  --  98.0  PLT  --  194    Basic Metabolic Panel:   Recent Labs Lab 09/16/16 2056 09/16/16 2103  NA 142 140  K 4.1 4.1  CL 107 105  CO2  --  25  GLUCOSE 116* 126*  BUN 32* 26*  CREATININE 1.00 1.15*  CALCIUM  --  9.9    Lipid Panel:     Component Value Date/Time   CHOL 288 (H) 09/17/2016 0634   TRIG 190 (H) 09/17/2016 0634   HDL 43 09/17/2016 0634   CHOLHDL 6.7 09/17/2016 0634   VLDL 38 09/17/2016 0634   LDLCALC 207 (H) 09/17/2016 0634   HgbA1c:  Lab Results  Component Value Date   HGBA1C 5.6 09/17/2016   Urine Drug Screen:     Component Value Date/Time   LABOPIA NONE DETECTED 02/01/2016 2325   COCAINSCRNUR NONE DETECTED 02/01/2016 2325   COCAINSCRNUR NEGATIVE 02/07/2015 2047   LABBENZ NONE DETECTED 02/01/2016 2325   AMPHETMU NONE DETECTED 02/01/2016 2325   THCU NONE DETECTED 02/01/2016 2325   LABBARB NONE DETECTED 02/01/2016 2325      IMAGING I have personally reviewed the radiological images below and agree with the radiology interpretations.  Ct Head Code Stroke W/o Cm 09/16/2016 1. No evidence of large acute infarct, mass effect, or intracranial hemorrhage and symptoms persist or if clinically indicated MRI is more sensitive for acute stroke. 2. ASPECTS is 10 3. Stable background of small chronic infarcts,  advanced chronic microvascular ischemic changes, and moderate parenchymal volume loss.   Ct Angio Head W Or Wo Contrast Ct Angio Neck W And/or Wo Contrast Ct Cerebral Perfusion W Contrast 09/16/2016 1. No significant core infarct or ischemic penumbra on CT perfusion. 2. Right cerebral watershed territory increased T-max likely due to severe atherosclerotic disease in both the right ICA, right ACA, and right M1 segments. 3. No large vessel occlusion of the circle of Willis greatest in the right ICA distribution but advanced in all vascular territories. 4. Moderate stenosis plaques of the proximal left common carotid artery and left internal carotid artery. Otherwise no high-grade stenosis, dissection, or aneurysm of the carotid and vertebral arteries of the neck.   EEG - diffuse cerebral dysfunction that is non-specific in etiology and can be seen with hypoxic/ischemic injury, toxic/metabolic encephalopathies, neurodegenerative disorders, or medication effect.  The absence of epileptiform discharges does not rule out a clinical diagnosis of epilepsy.  Clinical correlation is advised.  Ct Head Wo Contrast 09/18/2016 IMPRESSION: Stable head CT without acute infarct or interval change.   PHYSICAL EXAM  Temp:  [97.5 F (36.4 C)-97.9 F (36.6 C)] 97.9 F (36.6 C) (11/08 1340) Pulse Rate:  [64-78] 64 (11/08 1340) Resp:  [18] 18 (11/08 1340) BP: (116-178)/(65-97) 178/66 (11/08 1340) SpO2:  [  96 %-100 %] 98 % (11/08 1340)  General - Well nourished, well developed, in no apparent distress.  Ophthalmologic - Fundi not visualized due to noncooperation.  Cardiovascular - Regular rate and rhythm, not in afib.  Neuro - awake, but not alert, not following commands, able to have words out but intelligible. Not able to name or repeat. PERRL, eye deviated to right, left gaze and left neglect. Facial symmetrical. Tongue midline in mouth. Able to move BUEs and BLEs symmetrically and against gravity on pain  stimulatoin, lateralizing weakness. DTR 1+ and no babinski. Sensation, coordination and gait not tested.    ASSESSMENT/[name] [name] [name] is a 80 y.o. female with history of atrial fibrillation, pacemaker and left MCA stroke presenting with R gaze deviation, L facial droop and L sided weakness. She did not receive IV t-PA due to delay in arival.   ? Stroke:  Right brain infar[facility]. EEG neg for seizure.  Resultant  Right gaze and nonverbal  MRI and MRA not able to be done due to pacer  CTA head and neck no infarct. Severe atherosclerosis R ICA, R ACA R M1. No LVO.  CT perfusion  No penumbra  EEG - diffuse slow, no seizure like activity  CT repeat no acute infarct seen  LDL 207  HgbA1c 5.4  SCDs for VTE prophylaxis DIET - DYS 1 Room service appropriate? Yes; Fluid consistency: Nectar Thick DIET - DYS 1  clopidogrel 75 mg daily prior to admission, now on clopidogrel 75 mg daily. (ASA allergy - pt with N&V related to ASA). Continue plavix on discharge.  Discussed with daughter, pt will back to SNF instead of rehab. No aggressive measures, will connect with hospice once at home.  Therapy recommendations:  SNF   Disposition:  SNF  Atrial Fibrillation  Home anticoagulation:  none due to fall risk  No DOAC as per family due to frequent falls.  Continue plavix  On cardiazem  Rate controlled.    Hypertension  Stable  Permissive hypertension (OK if < 220/120) but gradually normalize in 5-7 days  Long-term BP goal normotensive  Hyperlipidemia  Home meds:  No statin  LDL 207, goal < 70  Statin allergy  No aggressive measure as per family  Diabetes type II  HgbA1c 5.6, goal < 7.0  Under control  Other Stroke Risk Factors  Advanced age  Former Cigarette smoker  CHF, hypertrophic cardiomyopathy  Other Active Problems  Baseline dementia  COPD, O2 dependent  Hospital day # 2  Neurology  will sign off. Please call with questions. No neuro follow up needed. Thanks for the consult.  [name] PlanJindong Flower Franko, MD PhD Stroke Neurology 09/18/2016 2:35 PM     To contact Stroke Continuity provider, please refer to [email or web address]. After hours, contact General Neurology

## 2016-09-18 NOTE — Discharge Summary (Signed)
[facility] Discharge Summary   Patient: [name] [name] YNW:[id number]   PCP: [name] Patient DOB: 1936/10/25   Date of admission: 09/16/2016   Date of discharge:  09/18/2016    Discharge Diagnoses:  Principal Problem:   Stroke [facility]) Active Problems:   Essential hypertension   Atrial fibrillation (HCC)   Pacemaker   COPD (chronic obstructive pulmonary disease) (HCC)   Diabetes (HCC)   Dementia   Cerebrovascular accident (CVA) due to embolism of left middle cerebral artery (HCC)   Admitted From: SNF Disposition:  SNF with hospice  Recommendations for Outpatient Follow-up:  1. Please get palliative care involvement at the facility. 2. Recommended to establish goals of care discussion with the family. Family has decided to go with hospice on discharge at the facility. 3. Recommend to discuss regarding future hospitalization versus transition her care to inpatient hospice when the patient's condition deteriorates.  Diet recommendation: Dysphagia 1 (puree);Nectar-thick liquid Liquids provided via: Teaspoon Medication Administration: Crushed with puree Supervision: Full supervision/cueing for compensatory strategies;Staff to assist with self feeding Compensations: Minimize environmental distractions;Slow rate;Small sips/bites;Follow solids with liquid Postural Changes and/or Swallow Maneuvers: Seated upright 90 degrees  Activity: The patient is advised to gradually reintroduce usual activities.  Discharge Condition: fair  Code Status: DNR/DNI, discharging with hospice  History of present illness: As per the H and P dictated on admission, " [name] [name] is a 80 y.o. woman with a history of dementia, chronic atrial fibrillation (CHADS-Vasc score of at least 5, no longer anticoagulated due to recurrent falls and bleeding risk), permanent pacemaker implant, HTN, DM, CHF, COPD, and prior CVA one year ago who was referred to the ED via EMS for evaluation of acute mental status changes,  facial droop with right gaze preference, and garbled speech in the setting of elevated blood pressures (systolic greater than 200, diastolic greater than 100).  CODE Stroke activated.  The patient was last known well around 4PM.  It sounds like she was having a routine assessment at shift change, when her neurological deficits and abnormal blood pressures were discovered.  Her daughter at bedside reports that the patient seemed to be at her baseline when they visited yesterday; however her sister thinks the patient was showing signs that something was wrong on Saturday.  No recent antibiotics or hospitalizations in the past 30 days.  At baseline, the patient is only oriented to self and place.  She recognizes family but does not know the date/time.  She is usually in a wheelchair due to her history of falls."  Hospital Course:  Summary of her active problems in the hospital is as following.  Acute encephalopathy. clinical suspicion for stroke - MRI could not be obtained given patient has permanent pacemaker. - Neurology appreciated, initial CTA head and neck without evidence of acute CVA, repeat CT head is also unremarkable for any acute stroke. - EEG was obtained to rule out seizure, significant for encephalopathy, but no evidence of epileptiform activities,. - LDL  207 , has allergy to statins - Patient with known history of A. fib, a daughter in the past did not agree to full anticoagulation given she is a high fall risk, continue with Plavix(has aspirin allergy) - As per discussion of neurology with family, family does not want to pursue any aggressive workup and prefer the patient to be transitioned to hospice on discharge. Ideally this should be initiated at the facility where the patient is a long-term resident. Also would recommend to initiate discussion  regarding future hospitalization as well as transitioning care to inpatient hospice, should the patient deteriorates  further.  Hypertension - Uncontrolled, allow For permissive hypertension  Atrial fibrillation - Anticoagulation secondary to high risk fall, continue with Plavix - Continue with Cardizem  Hyperlipidemia - LDL is 207, has allergy to statin  Diabetes mellitus - Hemoglobin A1c, continue with insulin sliding scale   All other chronic medical condition were stable during the hospitalization.  Patient was seen by physical therapy, who recommended SNF, which was arranged by Child psychotherapist and case Production designer, theatre[employer]tele[employer]. On the day of the discharge the patient's vitals were stable, and no other acute medical condition were reported by patient. the patient was felt safe to be discharge at SNF with hospice.  Procedures and Results:  EEG  Impression: This awake EEG is abnormal due to moderate diffuse slowing of the waking background.  Clinical Correlation of the above findings indicates diffuse cerebral dysfunction that is non-specific in etiology and can be seen with hypoxic/ischemic injury, toxic/metabolic encephalopathies, neurodegenerative disorders, or medication effect.  Consultations:  Neurology  DISCHARGE MEDICATION: Current Discharge Medication List    START taking these medications   Details  food thickener (THICK IT) POWD Take 1 g by mouth as needed (per SLP). Refills: 0      CONTINUE these medications which have NOT CHANGED   Details  acetaminophen (TYLENOL) 325 MG tablet Take 650 mg by mouth 4 (four) times daily.     ALPRAZolam (XANAX) 0.25 MG tablet Take 0.25 mg by mouth 2 (two) times daily.    benzocaine-menthol (CHLORASEPTIC) 6-10 MG lozenge Take 1 lozenge by mouth every 2 (two) hours as needed for sore throat.     clopidogrel (PLAVIX) 75 MG tablet Take 1 tablet (75 mg total) by mouth daily. Qty: 30 tablet, Refills: 1    dextromethorphan-guaiFENesin (DIABETIC TUSSIN DM) 10-100 MG/5ML liquid Take 10 mLs by mouth every 6 (six) hours as needed for cough.    diltiazem  (TAZTIA XT) 180 MG 24 hr capsule Take 180 mg by mouth every morning.    divalproex (DEPAKOTE SPRINKLE) 125 MG capsule Take 250 mg by mouth 2 (two) times daily.    mirtazapine (REMERON) 15 MG tablet Take 15 mg by mouth at bedtime.    morphine (ROXANOL) 20 MG/ML concentrated solution Take 0.5 mLs (10 mg total) by mouth every 4 (four) hours as needed for severe pain. Qty: 30 mL, Refills: 0    OVER THE COUNTER MEDICATION "End It Ointment": Apply to irritated areas daily (as needed)    oxybutynin (DITROPAN XL) 15 MG 24 hr tablet Take 15 mg by mouth every morning.     senna (SENOKOT) 8.6 MG TABS tablet Take 1 tablet by mouth 2 (two) times daily.    haloperidol (HALDOL) 0.5 MG tablet Take 0.5 mg by mouth every 8 (eight) hours as needed for agitation.       Allergies  Allergen Reactions  . Aspirin Nausea And Vomiting  . Ativan [Lorazepam] Other (See Comments)    Hallucinations  . Calan [Verapamil Hcl] Other (See Comments)    Patient states it makes her "out of her mind"; bp bottoms out (takes diltiazem at home)  . Codeine Nausea And Vomiting  . Crestor [Rosuvastatin Calcium] Other (See Comments)    Muscle pain  . Escitalopram Oxalate Other (See Comments)    Reaction Unknown   . Lactose Intolerance (Gi) Diarrhea    Can tolerate in small amounts  . Lexapro [Escitalopram] Other (See Comments)  Unknown   . Lipitor [Atorvastatin Calcium] Other (See Comments)    myalgia  . Lopressor [Metoprolol Tartrate] Other (See Comments)    Blood pressure bottoms out   . Metoprolol Tartrate Other (See Comments)    Unknown   . Nitroglycerin Other (See Comments)    Reaction unknown  . Norvasc [Amlodipine Besylate] Other (See Comments)    fatigue  . Nsaids Nausea Only  . Oxycodone Other (See Comments)    Reaction unknown  . Percocet [Oxycodone-Acetaminophen] Nausea And Vomiting  . Prednisone Itching and Swelling  . Sulfa Antibiotics Nausea Only and Other (See Comments)    Makes her stomach  hurt  . Tape Other (See Comments)    Skin tears & bruises easily (SKIN IS THIN!!)  . Vimovo [Naproxen-Esomeprazole] Itching, Nausea Only and Swelling  . Zoloft [Sertraline] Other (See Comments)    UNKNOWN   . Penicillins Hives and Rash    Has patient had a PCN reaction causing immediate rash, facial/tongue/throat swelling, SOB or lightheadedness with hypotension: Yes Has patient had a PCN reaction causing severe rash involving mucus membranes or skin necrosis: No Has patient had a PCN reaction that required hospitalization No Has patient had a PCN reaction occurring within the last 10 years: No If all of the above answers are "NO", then may proceed with Cephalosporin use.    Discharge Instructions    DIET - DYS 1    Complete by:  As directed    Fluid consistency:  Nectar Thick   Diet recommendations: Dysphagia 1 (puree);Nectar-thick liquid Liquids provided via: Teaspoon Medication Administration: Crushed with puree Supervision: Full supervision/cueing for compensatory strategies;Staff to assist with self feeding Compensations: Minimize environmental distractions;Slow rate;Small sips/bites;Follow solids with liquid Postural Changes and/or Swallow Maneuvers: Seated upright 90 degrees   Discharge instructions    Complete by:  As directed    Re-establish hospice care and Recommend to re-initiate goals of care discussion.   Increase activity slowly    Complete by:  As directed      Discharge Exam: Filed Weights   09/16/16 2322  Weight: 55.5 kg (122 lb 5.7 oz)   Vitals:   09/18/16 0235 09/18/16 1004  BP: (!) 141/97 133/65  Pulse: 64 65  Resp: 18 18  Temp: 97.5 F (36.4 C) 97.8 F (36.6 C)   General: Appear in mild distress, no Rash; Oral Mucosa moist. Cardiovascular: S1 and S2 Present, no Murmur, no JVD Respiratory: Bilateral Air entry present and Clear to Auscultation, no Crackles, no wheezes Abdomen: Bowel Sound present, Soft and no tenderness Extremities: no Pedal  edema, no calf tenderness Neurology: Grossly no focal neuro deficit. Remains confused and lethargic. Unable to follow command.  The results of significant diagnostics from this hospitalization (including imaging, microbiology, ancillary and laboratory) are listed below for reference.    Significant Diagnostic Studies: Ct Angio Head W Or Wo Contrast  Result Date: 09/16/2016 CLINICAL DATA:  80 y/o F; left-sided weakness with concern for acute stroke. EXAM: CT ANGIOGRAPHY HEAD AND NECK CT PERFUSION WITH CONTRAST TECHNIQUE: Multidetector CT imaging of the head and neck was performed using the standard protocol during bolus administration of intravenous contrast. Multiplanar CT image reconstructions and MIPs were obtained to evaluate the vascular anatomy. Carotid stenosis measurements (when applicable) are obtained utilizing NASCET criteria, using the distal internal carotid diameter as the denominator. CT perfusion of the head was performed with automatic post processing, segmentation, and quantification by RAPID software. CONTRAST:  90 cc Isovue 370 COMPARISON:  None. FINDINGS: CTA  NECK FINDINGS Aortic arch: Standard branching. Imaged portion shows no evidence of aneurysm or dissection. Aortic atherosclerosis with mixed calcified and fibro fatty plaque. Partially visualized pacemaker device and leads in the left subclavian vein and upper SVC. 50% stenosis of the origin of left subclavian artery. Right carotid system: No evidence of dissection, stenosis (50% or greater) or occlusion. Multiple levels of mixed plaque of both the common and internal carotid segments with multiple areas of mild approximately 30% stenosis. Left carotid system: Left common carotid artery proximal fibro fatty plaque with 50% moderate stenosis (series 501, image 143). There additional common carotid artery plaques with minimal to mild stenosis and small plaque ulcerations just proximal to the bifurcation. Mixed plaque at the bifurcation  with proximal ICA moderate 60% stenosis (series 501, image 96). Vertebral arteries: Codominant. No evidence of dissection, stenosis (50% or greater) or occlusion. Skeleton: Degenerative changes of the cervical spine without high-grade canal stenosis. Other neck: No mass, lymphadenopathy, or discrete inflammatory process of the neck identified. Upper chest: Peripheral fibrotic changes of the lung apices. Multiple areas of mucous plugging and airway thickening with scattered tiny nodules consistent with bronchiolitis. Review of the MIP images confirms the above findings CTA HEAD FINDINGS No aneurysm of circle of Willis identified. Anterior circulation: Extensive calcific atherosclerosis of cavernous and paraclinoid segments with moderate to severe right distal cavernous and moderate left supraclinoid internal carotid artery stenosis (series 503, image 80 and 85). Right M1 segment short segment of moderate to severe stenosis (series 503, image 87). Multiple additional areas of mild-to-moderate stenosis of the distal right MCA circulation. No significant stenosis of the left M1 segment. Multiple areas of mild-to-moderate stenosis in the distal left MCA circulation. No significant stenosis of bilateral A1 segments. Irregularity of the to approximately A3 segments with several short segments of moderate severe stenosis. Posterior circulation: Right greater than left distal P1 segment severe stenosis. Multiple areas of vessel irregularity and mild-to-moderate stenosis of distal PCA segments bilaterally. Two segments of mid and proximal basilar mild stenosis. Right vertebral artery mixed plaque with severe 90% stenosis/near occlusion (series 503, image 117). Left vertebral artery calcified plaque with moderate 50% stenosis (503:120). Venous sinuses: As permitted by contrast timing, patent. Anatomic variants: No posterior communicating artery identified, likely hypoplastic or absent. Small anterior communicating artery.  Review of the MIP images confirms the above findings CT PERFUSION FINDINGS No significant rotational or translational motion of the patient. Adequate quality arterial inflow and venous outflow functions. Cerebral blood flow less than 30% volume: 0 cc T-max greater than 6 seconds volume: 9 cc (probably artifactual confined to the peripheries of cerebellum) T-max greater than 4 seconds volume: 94 cc in a right-sided watershed distribution. IMPRESSION: 1. No significant core infarct or ischemic penumbra on CT perfusion. 2. Right cerebral watershed territory increased T-max likely due to severe atherosclerotic disease in both the right ICA, right ACA, and right M1 segments. 3. No large vessel occlusion of the circle of Willis greatest in the right ICA distribution but advanced in all vascular territories. 4. Moderate stenosis plaques of the proximal left common carotid artery and left internal carotid artery. Otherwise no high-grade stenosis, dissection, or aneurysm of the carotid and vertebral arteries of the neck. These results were called by telephone at the time of interpretation on 09/16/2016 at 10:05 pm to Dr. Otelia LimesLindzen, who verbally acknowledged these results. Electronically Signed   By: Mitzi HansenLance  Furusawa-Stratton M.D.   On: 09/16/2016 22:15   Ct Head Wo Contrast  Result Date: 09/18/2016  CLINICAL DATA:  Stroke. EXAM: CT HEAD WITHOUT CONTRAST TECHNIQUE: Contiguous axial images were obtained from the base of the skull through the vertex without intravenous contrast. COMPARISON:  CT 09/16/2016 FINDINGS: Brain: Moderate atrophy. Chronic microvascular ischemic changes in the white matter. Chronic infarct left parietal lobe unchanged. Small chronic infarct right lower cerebellum. No acute infarct. Negative for hemorrhage or mass. No shift of the midline structures. No change from the prior study. Vascular: Negative Skull: Negative Sinuses/Orbits: Negative Other: Non IMPRESSION: Stable head CT without acute infarct or  interval change. Electronically Signed   By: Marlan Palau M.D.   On: 09/18/2016 09:01   Ct Angio Neck W And/or Wo Contrast  Result Date: 09/16/2016 CLINICAL DATA:  79 y/o F; left-sided weakness with concern for acute stroke. EXAM: CT ANGIOGRAPHY HEAD AND NECK CT PERFUSION WITH CONTRAST TECHNIQUE: Multidetector CT imaging of the head and neck was performed using the standard protocol during bolus administration of intravenous contrast. Multiplanar CT image reconstructions and MIPs were obtained to evaluate the vascular anatomy. Carotid stenosis measurements (when applicable) are obtained utilizing NASCET criteria, using the distal internal carotid diameter as the denominator. CT perfusion of the head was performed with automatic post processing, segmentation, and quantification by RAPID software. CONTRAST:  90 cc Isovue 370 COMPARISON:  None. FINDINGS: CTA NECK FINDINGS Aortic arch: Standard branching. Imaged portion shows no evidence of aneurysm or dissection. Aortic atherosclerosis with mixed calcified and fibro fatty plaque. Partially visualized pacemaker device and leads in the left subclavian vein and upper SVC. 50% stenosis of the origin of left subclavian artery. Right carotid system: No evidence of dissection, stenosis (50% or greater) or occlusion. Multiple levels of mixed plaque of both the common and internal carotid segments with multiple areas of mild approximately 30% stenosis. Left carotid system: Left common carotid artery proximal fibro fatty plaque with 50% moderate stenosis (series 501, image 143). There additional common carotid artery plaques with minimal to mild stenosis and small plaque ulcerations just proximal to the bifurcation. Mixed plaque at the bifurcation with proximal ICA moderate 60% stenosis (series 501, image 96). Vertebral arteries: Codominant. No evidence of dissection, stenosis (50% or greater) or occlusion. Skeleton: Degenerative changes of the cervical spine without  high-grade canal stenosis. Other neck: No mass, lymphadenopathy, or discrete inflammatory process of the neck identified. Upper chest: Peripheral fibrotic changes of the lung apices. Multiple areas of mucous plugging and airway thickening with scattered tiny nodules consistent with bronchiolitis. Review of the MIP images confirms the above findings CTA HEAD FINDINGS No aneurysm of circle of Willis identified. Anterior circulation: Extensive calcific atherosclerosis of cavernous and paraclinoid segments with moderate to severe right distal cavernous and moderate left supraclinoid internal carotid artery stenosis (series 503, image 80 and 85). Right M1 segment short segment of moderate to severe stenosis (series 503, image 87). Multiple additional areas of mild-to-moderate stenosis of the distal right MCA circulation. No significant stenosis of the left M1 segment. Multiple areas of mild-to-moderate stenosis in the distal left MCA circulation. No significant stenosis of bilateral A1 segments. Irregularity of the to approximately A3 segments with several short segments of moderate severe stenosis. Posterior circulation: Right greater than left distal P1 segment severe stenosis. Multiple areas of vessel irregularity and mild-to-moderate stenosis of distal PCA segments bilaterally. Two segments of mid and proximal basilar mild stenosis. Right vertebral artery mixed plaque with severe 90% stenosis/near occlusion (series 503, image 117). Left vertebral artery calcified plaque with moderate 50% stenosis (503:120). Venous sinuses: As permitted  by contrast timing, patent. Anatomic variants: No posterior communicating artery identified, likely hypoplastic or absent. Small anterior communicating artery. Review of the MIP images confirms the above findings CT PERFUSION FINDINGS No significant rotational or translational motion of the patient. Adequate quality arterial inflow and venous outflow functions. Cerebral blood flow less  than 30% volume: 0 cc T-max greater than 6 seconds volume: 9 cc (probably artifactual confined to the peripheries of cerebellum) T-max greater than 4 seconds volume: 94 cc in a right-sided watershed distribution. IMPRESSION: 1. No significant core infarct or ischemic penumbra on CT perfusion. 2. Right cerebral watershed territory increased T-max likely due to severe atherosclerotic disease in both the right ICA, right ACA, and right M1 segments. 3. No large vessel occlusion of the circle of Willis greatest in the right ICA distribution but advanced in all vascular territories. 4. Moderate stenosis plaques of the proximal left common carotid artery and left internal carotid artery. Otherwise no high-grade stenosis, dissection, or aneurysm of the carotid and vertebral arteries of the neck. These results were called by telephone at the time of interpretation on 09/16/2016 at 10:05 pm to Dr. Otelia Limes, who verbally acknowledged these results. Electronically Signed   By: Mitzi Hansen M.D.   On: 09/16/2016 22:15   Ct Cerebral Perfusion W Contrast  Result Date: 09/16/2016 CLINICAL DATA:  81 y/o F; left-sided weakness with concern for acute stroke. EXAM: CT ANGIOGRAPHY HEAD AND NECK CT PERFUSION WITH CONTRAST TECHNIQUE: Multidetector CT imaging of the head and neck was performed using the standard protocol during bolus administration of intravenous contrast. Multiplanar CT image reconstructions and MIPs were obtained to evaluate the vascular anatomy. Carotid stenosis measurements (when applicable) are obtained utilizing NASCET criteria, using the distal internal carotid diameter as the denominator. CT perfusion of the head was performed with automatic post processing, segmentation, and quantification by RAPID software. CONTRAST:  90 cc Isovue 370 COMPARISON:  None. FINDINGS: CTA NECK FINDINGS Aortic arch: Standard branching. Imaged portion shows no evidence of aneurysm or dissection. Aortic atherosclerosis with  mixed calcified and fibro fatty plaque. Partially visualized pacemaker device and leads in the left subclavian vein and upper SVC. 50% stenosis of the origin of left subclavian artery. Right carotid system: No evidence of dissection, stenosis (50% or greater) or occlusion. Multiple levels of mixed plaque of both the common and internal carotid segments with multiple areas of mild approximately 30% stenosis. Left carotid system: Left common carotid artery proximal fibro fatty plaque with 50% moderate stenosis (series 501, image 143). There additional common carotid artery plaques with minimal to mild stenosis and small plaque ulcerations just proximal to the bifurcation. Mixed plaque at the bifurcation with proximal ICA moderate 60% stenosis (series 501, image 96). Vertebral arteries: Codominant. No evidence of dissection, stenosis (50% or greater) or occlusion. Skeleton: Degenerative changes of the cervical spine without high-grade canal stenosis. Other neck: No mass, lymphadenopathy, or discrete inflammatory process of the neck identified. Upper chest: Peripheral fibrotic changes of the lung apices. Multiple areas of mucous plugging and airway thickening with scattered tiny nodules consistent with bronchiolitis. Review of the MIP images confirms the above findings CTA HEAD FINDINGS No aneurysm of circle of Willis identified. Anterior circulation: Extensive calcific atherosclerosis of cavernous and paraclinoid segments with moderate to severe right distal cavernous and moderate left supraclinoid internal carotid artery stenosis (series 503, image 80 and 85). Right M1 segment short segment of moderate to severe stenosis (series 503, image 87). Multiple additional areas of mild-to-moderate stenosis of the distal  right MCA circulation. No significant stenosis of the left M1 segment. Multiple areas of mild-to-moderate stenosis in the distal left MCA circulation. No significant stenosis of bilateral A1 segments.  Irregularity of the to approximately A3 segments with several short segments of moderate severe stenosis. Posterior circulation: Right greater than left distal P1 segment severe stenosis. Multiple areas of vessel irregularity and mild-to-moderate stenosis of distal PCA segments bilaterally. Two segments of mid and proximal basilar mild stenosis. Right vertebral artery mixed plaque with severe 90% stenosis/near occlusion (series 503, image 117). Left vertebral artery calcified plaque with moderate 50% stenosis (503:120). Venous sinuses: As permitted by contrast timing, patent. Anatomic variants: No posterior communicating artery identified, likely hypoplastic or absent. Small anterior communicating artery. Review of the MIP images confirms the above findings CT PERFUSION FINDINGS No significant rotational or translational motion of the patient. Adequate quality arterial inflow and venous outflow functions. Cerebral blood flow less than 30% volume: 0 cc T-max greater than 6 seconds volume: 9 cc (probably artifactual confined to the peripheries of cerebellum) T-max greater than 4 seconds volume: 94 cc in a right-sided watershed distribution. IMPRESSION: 1. No significant core infarct or ischemic penumbra on CT perfusion. 2. Right cerebral watershed territory increased T-max likely due to severe atherosclerotic disease in both the right ICA, right ACA, and right M1 segments. 3. No large vessel occlusion of the circle of Willis greatest in the right ICA distribution but advanced in all vascular territories. 4. Moderate stenosis plaques of the proximal left common carotid artery and left internal carotid artery. Otherwise no high-grade stenosis, dissection, or aneurysm of the carotid and vertebral arteries of the neck. These results were called by telephone at the time of interpretation on 09/16/2016 at 10:05 pm to Dr. Otelia Limes, who verbally acknowledged these results. Electronically Signed   By: Mitzi Hansen  M.D.   On: 09/16/2016 22:15   Ct Head Code Stroke W/o Cm  Result Date: 09/16/2016 CLINICAL DATA:  Code stroke.  Patient not moving her left side. EXAM: CT HEAD WITHOUT CONTRAST TECHNIQUE: Contiguous axial images were obtained from the base of the skull through the vertex without intravenous contrast. COMPARISON:  06/05/2016 CT head. FINDINGS: Brain: No evidence of large acute territory infarct, focal mass effect, or intracranial hemorrhage. Stable chronic left parietal infarct. Stable right putamen, right caudate head, and right anterior insula chronic lacunar infarcts. Stable small chronic infarct within the right cerebellar hemisphere. Stable background of exam chronic microvascular ischemic changes and moderate parenchymal volume loss. No extra-axial collection identified. Nonspecific right parietal cortical calcifications are unchanged. Vascular: Calcific atherosclerosis of the cavernous internal carotid arteries and vertebral arteries. Skull: Normal. Negative for fracture or focal lesion. Sinuses/Orbits: Right frontal sinus mucous retention cyst. Otherwise the visualized paranasal sinuses and mastoids are clear. Other: None. ASPECTS [facility] Stroke Program Early CT Score) - Ganglionic level infarction (caudate, lentiform nuclei, internal capsule, insula, M1-M3 cortex): 7 - Supraganglionic infarction (M4-M6 cortex): 3 Total score (0-10 with 10 being normal): 10 IMPRESSION: 1. No evidence of large acute infarct, mass effect, or intracranial hemorrhage and symptoms persist or if clinically indicated MRI is more sensitive for acute stroke. 2. ASPECTS is 10 3. Stable background of small chronic infarcts, advanced chronic microvascular ischemic changes, and moderate parenchymal volume loss. These results were called by telephone at the time of interpretation on 09/16/2016 at 9:16 pm to Dr. Chaney Malling , who verbally acknowledged these results. Electronically Signed   By: Mitzi Hansen M.D.   On:  09/16/2016 21:16  Microbiology: Recent Results (from the past 240 hour(s))  MRSA PCR Screening     Status: None   Collection Time: 09/17/16  6:32 AM  Result Value Ref Range Status   MRSA by PCR NEGATIVE NEGATIVE Final    Comment:        The GeneXpert MRSA Assay (FDA approved for NASAL specimens only), is one component of a comprehensive MRSA colonization surveillance program. It is not intended to diagnose MRSA infection nor to guide or monitor treatment for MRSA infections.      Labs: CBC:  Recent Labs Lab 09/16/16 2056 09/16/16 2103  WBC  --  5.6  NEUTROABS  --  3.4  HGB 14.6 15.0  HCT 43.0 44.2  MCV  --  98.0  PLT  --  194   Basic Metabolic Panel:  Recent Labs Lab 09/16/16 2056 09/16/16 2103  NA 142 140  K 4.1 4.1  CL 107 105  CO2  --  25  GLUCOSE 116* 126*  BUN 32* 26*  CREATININE 1.00 1.15*  CALCIUM  --  9.9   Liver Function Tests:  Recent Labs Lab 09/16/16 2103  AST 29  ALT 14  ALKPHOS 84  BILITOT 1.0  PROT 8.0  ALBUMIN 4.4   No results for input(s): LIPASE, AMYLASE in the last 168 hours. No results for input(s): AMMONIA in the last 168 hours. Cardiac Enzymes: No results for input(s): CKTOTAL, CKMB, CKMBINDEX, TROPONINI in the last 168 hours. BNP (last 3 results) No results for input(s): BNP in the last 8760 hours. CBG:  Recent Labs Lab 09/17/16 1100 09/17/16 1617 09/17/16 2226 09/18/16 0848 09/18/16 1131  GLUCAP 136* 116* 77 123* 200*   Time spent: 30 minutes  Signed:  Darby Fleeman  [facility]  09/18/2016  , 1:15 PM

## 2016-09-19 DIAGNOSIS — R410 Disorientation, unspecified: Secondary | ICD-10-CM | POA: Diagnosis not present

## 2016-09-19 LAB — GLUCOSE, CAPILLARY: Glucose-Capillary: 137 mg/dL — ABNORMAL HIGH (ref 65–99)

## 2016-09-19 NOTE — Clinical Social Work Note (Signed)
Clinical Social Work Assessment  Patient Details  Name: [name] HummingbirdMae L Minks MRN: [id number] Date of Birth: 09/27/1936  Date of referral:  09/18/16               Reason for consult:  Facility Placement, Discharge Planning, End of Life/Hospice                Permission sought to share information with:  Family Supports Permission granted to share information::  Yes, Verbal Permission Granted  Name::     Eulis FosterSheri [name]  Relationship::  daughter  Contact Information:  [phone]  Housing/Transportation Living arrangements for the past 2 months:  Skilled Nursing Facility Source of Information:  Adult Children Patient Interpreter Needed:  None Criminal Activity/Legal Involvement Pertinent to Current Situation/Hospitalization:  No - Comment as needed Significant Relationships:  Adult Children Lives with:  Facility Resident Do you feel safe going back to the place where you live?  Yes Need for family participation in patient care:  Yes (Comment)  Care giving concerns:  No care giving concerns identified.    Social Worker assessment / plan:  CSW spoke with pt's daughter to address consult. [facility] and is able to return at discharge, per facility. Pt is ready for discharge today. Pt is LTC. Pt's daughter is agreeable to pt returning. MD is recommending Hospice Services at SNF. Pt's daughter is in agreement. Facility has received discharge information and is ready to admit pt. RN to call report and [facility] will provide transportation. CSW is signing off as no further needs identified.   Employment status:  Retired Health and safety inspectornsurance information:  Medicare PT Recommendations:  Skilled Nursing Facility Information / Referral to community resources:   Neurosurgeon([facility])  Patient/Family's Response to care:  Pt's daughter was appreciative of CSW's support.   Patient/Family's Understanding of and Emotional Response to Diagnosis, Current Treatment, and Prognosis:   Pt's daughter understands that  pt is to return to facility with hospice.   Emotional Assessment Appearance:  Appears stated age Attitude/Demeanor/Rapport:  Other (Sleeping) Affect (typically observed):  Other (Sleeping) Orientation:    Alcohol / Substance use:  Never Used Psych involvement (Current and /or in the community):  No (Comment)  Discharge Needs  Concerns to be addressed:  Adjustment to Illness, Grief and Loss Concerns Readmission within the last 30 days:  No Current discharge risk:  Chronically ill Barriers to Discharge:  No Barriers Identified   Dede QuerySarah Rikki Smestad, LCSW 09/19/2016, 2:58 PM

## 2016-09-20 DIAGNOSIS — R410 Disorientation, unspecified: Secondary | ICD-10-CM | POA: Diagnosis not present

## 2016-09-20 LAB — GLUCOSE, CAPILLARY: Glucose-Capillary: 141 mg/dL — ABNORMAL HIGH (ref 65–99)

## 2016-09-21 DIAGNOSIS — R410 Disorientation, unspecified: Secondary | ICD-10-CM | POA: Diagnosis not present

## 2016-09-21 LAB — GLUCOSE, CAPILLARY: Glucose-Capillary: 127 mg/dL — ABNORMAL HIGH (ref 65–99)

## 2016-09-22 DIAGNOSIS — R410 Disorientation, unspecified: Secondary | ICD-10-CM | POA: Diagnosis not present

## 2016-09-22 LAB — GLUCOSE, CAPILLARY: GLUCOSE-CAPILLARY: 110 mg/dL — AB (ref 65–99)

## 2016-09-23 DIAGNOSIS — R410 Disorientation, unspecified: Secondary | ICD-10-CM | POA: Diagnosis not present

## 2016-09-23 LAB — URINALYSIS COMPLETE WITH MICROSCOPIC (ARMC ONLY)
BILIRUBIN URINE: NEGATIVE
GLUCOSE, UA: NEGATIVE mg/dL
Hgb urine dipstick: NEGATIVE
Nitrite: NEGATIVE
Protein, ur: 100 mg/dL — AB
RBC / HPF: NONE SEEN RBC/hpf (ref 0–5)
SQUAMOUS EPITHELIAL / LPF: NONE SEEN
Specific Gravity, Urine: 1.021 (ref 1.005–1.030)
pH: 8 (ref 5.0–8.0)

## 2016-09-23 LAB — GLUCOSE, CAPILLARY: GLUCOSE-CAPILLARY: 108 mg/dL — AB (ref 65–99)

## 2016-09-24 DIAGNOSIS — R410 Disorientation, unspecified: Secondary | ICD-10-CM | POA: Diagnosis not present

## 2016-09-24 LAB — GLUCOSE, CAPILLARY: GLUCOSE-CAPILLARY: 115 mg/dL — AB (ref 65–99)

## 2016-09-25 LAB — URINE CULTURE: Culture: >=100000 — AB

## 2016-09-27 DIAGNOSIS — R410 Disorientation, unspecified: Secondary | ICD-10-CM | POA: Diagnosis not present

## 2016-09-27 DIAGNOSIS — R350 Frequency of micturition: Secondary | ICD-10-CM | POA: Diagnosis not present

## 2016-09-27 DIAGNOSIS — E119 Type 2 diabetes mellitus without complications: Secondary | ICD-10-CM | POA: Diagnosis not present

## 2016-09-27 LAB — GLUCOSE, CAPILLARY: Glucose-Capillary: 102 mg/dL — ABNORMAL HIGH (ref 65–99)

## 2016-10-01 DIAGNOSIS — R410 Disorientation, unspecified: Secondary | ICD-10-CM | POA: Diagnosis not present

## 2016-10-01 LAB — GLUCOSE, CAPILLARY: GLUCOSE-CAPILLARY: 115 mg/dL — AB (ref 65–99)

## 2016-10-02 DIAGNOSIS — R410 Disorientation, unspecified: Secondary | ICD-10-CM | POA: Diagnosis not present

## 2016-10-02 LAB — GLUCOSE, CAPILLARY: Glucose-Capillary: 132 mg/dL — ABNORMAL HIGH (ref 65–99)

## 2016-10-03 DIAGNOSIS — R410 Disorientation, unspecified: Secondary | ICD-10-CM | POA: Diagnosis not present

## 2016-10-03 LAB — GLUCOSE, CAPILLARY: Glucose-Capillary: 100 mg/dL — ABNORMAL HIGH (ref 65–99)

## 2016-10-05 DIAGNOSIS — R410 Disorientation, unspecified: Secondary | ICD-10-CM | POA: Diagnosis not present

## 2016-10-05 LAB — GLUCOSE, CAPILLARY: Glucose-Capillary: 105 mg/dL — ABNORMAL HIGH (ref 65–99)

## 2016-10-06 DIAGNOSIS — R410 Disorientation, unspecified: Secondary | ICD-10-CM | POA: Diagnosis not present

## 2016-10-06 LAB — GLUCOSE, CAPILLARY: Glucose-Capillary: 104 mg/dL — ABNORMAL HIGH (ref 65–99)

## 2016-10-07 DIAGNOSIS — R410 Disorientation, unspecified: Secondary | ICD-10-CM | POA: Diagnosis not present

## 2016-10-07 LAB — GLUCOSE, CAPILLARY: GLUCOSE-CAPILLARY: 98 mg/dL (ref 65–99)

## 2016-10-08 DIAGNOSIS — R410 Disorientation, unspecified: Secondary | ICD-10-CM | POA: Diagnosis not present

## 2016-10-08 LAB — GLUCOSE, CAPILLARY: Glucose-Capillary: 123 mg/dL — ABNORMAL HIGH (ref 65–99)

## 2016-10-09 DIAGNOSIS — R410 Disorientation, unspecified: Secondary | ICD-10-CM | POA: Diagnosis not present

## 2016-10-09 LAB — GLUCOSE, CAPILLARY: Glucose-Capillary: 75 mg/dL (ref 65–99)

## 2016-10-10 DIAGNOSIS — R410 Disorientation, unspecified: Secondary | ICD-10-CM | POA: Diagnosis not present

## 2016-10-10 LAB — GLUCOSE, CAPILLARY: Glucose-Capillary: 112 mg/dL — ABNORMAL HIGH (ref 65–99)

## 2016-10-11 ENCOUNTER — Encounter
Admission: RE | Admit: 2016-10-11 | Discharge: 2016-10-11 | Disposition: A | Discharge status: Home / Self Care | Source: Ambulatory Visit | Attending: Internal Medicine | Admitting: Internal Medicine

## 2016-10-11 DIAGNOSIS — E119 Type 2 diabetes mellitus without complications: Secondary | ICD-10-CM | POA: Diagnosis not present

## 2016-10-11 DIAGNOSIS — R4182 Altered mental status, unspecified: Secondary | ICD-10-CM | POA: Diagnosis not present

## 2016-10-11 LAB — GLUCOSE, CAPILLARY: Glucose-Capillary: 113 mg/dL — ABNORMAL HIGH (ref 65–99)

## 2016-10-24 DIAGNOSIS — R4182 Altered mental status, unspecified: Secondary | ICD-10-CM | POA: Diagnosis not present

## 2016-10-24 LAB — URINALYSIS, COMPLETE (UACMP) WITH MICROSCOPIC
BILIRUBIN URINE: NEGATIVE
GLUCOSE, UA: NEGATIVE mg/dL
NITRITE: NEGATIVE
PROTEIN: 30 mg/dL — AB
Specific Gravity, Urine: 1.02 (ref 1.005–1.030)
pH: 7 (ref 5.0–8.0)

## 2016-10-25 LAB — URINE CULTURE

## 2016-10-28 ENCOUNTER — Other Ambulatory Visit
Admission: RE | Admit: 2016-10-28 | Discharge: 2016-10-28 | Disposition: A | Discharge status: Home / Self Care | Source: Ambulatory Visit | Attending: Gerontology | Admitting: Gerontology

## 2016-10-28 DIAGNOSIS — R8299 Other abnormal findings in urine: Secondary | ICD-10-CM | POA: Insufficient documentation

## 2016-10-28 DIAGNOSIS — R350 Frequency of micturition: Secondary | ICD-10-CM | POA: Diagnosis not present

## 2016-10-28 LAB — URINALYSIS, COMPLETE (UACMP) WITH MICROSCOPIC
Bilirubin Urine: NEGATIVE
GLUCOSE, UA: NEGATIVE mg/dL
Hgb urine dipstick: NEGATIVE
Ketones, ur: 5 mg/dL — AB
NITRITE: POSITIVE — AB
PH: 8 (ref 5.0–8.0)
PROTEIN: 30 mg/dL — AB
Specific Gravity, Urine: 1.02 (ref 1.005–1.030)
Squamous Epithelial / LPF: NONE SEEN

## 2016-10-30 ENCOUNTER — Non-Acute Institutional Stay (SKILLED_NURSING_FACILITY): Payer: Medicare Other | Admitting: Gerontology

## 2016-10-30 DIAGNOSIS — N390 Urinary tract infection, site not specified: Secondary | ICD-10-CM | POA: Diagnosis not present

## 2016-10-31 LAB — URINE CULTURE: Culture: >=100000 — AB

## 2016-11-07 ENCOUNTER — Non-Acute Institutional Stay (SKILLED_NURSING_FACILITY): Payer: Medicare Other | Admitting: Gerontology

## 2016-11-07 DIAGNOSIS — N390 Urinary tract infection, site not specified: Secondary | ICD-10-CM

## 2016-11-07 DIAGNOSIS — R4182 Altered mental status, unspecified: Secondary | ICD-10-CM | POA: Diagnosis not present

## 2016-11-07 DIAGNOSIS — R410 Disorientation, unspecified: Secondary | ICD-10-CM | POA: Diagnosis not present

## 2016-11-07 LAB — URINALYSIS, ROUTINE W REFLEX MICROSCOPIC
Bilirubin Urine: NEGATIVE
GLUCOSE, UA: NEGATIVE mg/dL
HGB URINE DIPSTICK: NEGATIVE
Ketones, ur: NEGATIVE mg/dL
NITRITE: NEGATIVE
PROTEIN: NEGATIVE mg/dL
Specific Gravity, Urine: 1.006 (ref 1.005–1.030)
pH: 7 (ref 5.0–8.0)

## 2016-11-09 LAB — URINE CULTURE

## 2016-11-11 ENCOUNTER — Encounter
Admission: RE | Admit: 2016-11-11 | Discharge: 2016-11-11 | Disposition: A | Discharge status: Home / Self Care | Payer: Medicare Other | Source: Ambulatory Visit | Attending: Internal Medicine | Admitting: Internal Medicine

## 2016-11-11 NOTE — Progress Notes (Signed)
Location:      Place of Service:  SNF (31) Provider:  [name], NP-C  [name] Patient  Patient Care Team: [name] Patient as PCP - General (General Practice)  Extended Emergency Contact Information Primary Emergency Contact: [name],[name]          Bruna Potter, [place] Phone: [phone] Relation: Daughter Secondary Emergency Contact: [name] Address: [address]          [place], [place] [address] [place] Home Phone: [phone] Mobile Phone: [phone] Relation: Daughter  Code Status:  dnr Goals of care: Advanced Directive information Advanced Directives 09/16/2016  Does Patient Have a Medical Advance Directive? Yes  Type of Advance Directive Out of facility DNR (pink MOST or yellow form)  Does patient want to make changes to medical advance directive? -  Copy of Healthcare Power of Attorney in Chart? -  Would patient like information on creating a medical advance directive? -  Pre-existing out of facility DNR order (yellow form or pink MOST form) -     Chief Complaint  Patient presents with  . Acute Visit    HPI:  Pt is a 81 y.o. female seen today for an acute visit for acute delirium, agitation. Pt had sudden onset (toda) of increased confusion, agitation and restlessness. Pt was observed for the visit sitting in the Geri-chair at the nurses station. RN was having to give one on one care. Pt was constantly turning around in the chair, laying sideways, trying to get out of the chair. Nursing had a pillow and blanket as a drink and a snack for the patient. She was more confused than usual. Unable to re-direct. Pt denied SOB, reports back pain and headache; but was agitated by questioning/ interview. Pt has not had any recent falls/ no head trauma. No other complaints. Afebrile.   Past Medical History:  Diagnosis Date  . Anemia    "as a child" (08/05/2013)  . Anxiety   . Arthritis    "all over me; in all my  joints" (08/05/2013)  . Atrial fibrillation (HCC)    a. Chronic; No longer on coumadin 2/2 frequent falls.  . Chest pain    a. 08/2011 Myoview: sm, fixed anteroseptal defect (attenuation), no ischemia, EF 62%.  . CHF (congestive heart failure) (HCC)   . Coccyx pain   . COPD (chronic obstructive pulmonary disease) (HCC)   . Dementia   . Depression   . Falls frequently    coumadin stopped  . GERD (gastroesophageal reflux disease)   . Gout    "right foot" (08/05/2013)  . H/O hiatal hernia   . History of pneumonia    "couple times; long time ago" (08/05/2013)  . Hypercholesterolemia   . Hypertension   . Hypertrophic cardiomyopathy (HCC)    a. 11/2013 Echo: EF 55-60%, basal inf HK, sev dil LA, no evidence of HCM.  PASP .  [name] Hyponatremia   . Hyposmolality   . Osteoarthritis   . Oxygen dependent    "3L 24/7" (08/05/2013)  . Pacemaker    a. 04/2012 MDT [id number] [employer] PPM, ser #: [id number] H.  . Pulmonary HTN    a. has had prior right heart cath in 2010; was felt that most likely due to elevated left sided pressures and would not benefit from vasodilator therapy;  b. 11/2013 Echo: PASP .  . Situational mixed anxiety and depressive disorder   . TIA (transient ischemic attack)   . Type  II diabetes mellitus (HCC)   . Varicose veins    Past Surgical History:  Procedure Laterality Date  . ABDOMINAL HYSTERECTOMY     "partial" (08/05/2013)  . APPENDECTOMY    . BACK SURGERY     "bone spur removed; mid back" (08/05/2013)  . CARDIAC CATHETERIZATION     "more than once" (08/05/2013)  . DILATION AND CURETTAGE OF UTERUS     "several" (08/05/2013)  . FOOT NEUROMA SURGERY Right   . INSERT / REPLACE / REMOVE PACEMAKER  1998; 2000's; 2014  . PACEMAKER GENERATOR CHANGE N/A 04/24/2012   Procedure: PACEMAKER GENERATOR CHANGE;  Surgeon: Duke Salvia, MD;  Location: [facility] CATH LAB;  Service: Cardiovascular;  Laterality: N/A;  . TONSILLECTOMY    . TOTAL KNEE ARTHROPLASTY Right 2011     Allergies  Allergen Reactions  . Aspirin Nausea And Vomiting  . Ativan [Lorazepam] Other (See Comments)    Hallucinations  . Calan [Verapamil Hcl] Other (See Comments)    Patient states it makes her "out of her mind"; bp bottoms out (takes diltiazem at home)  . Codeine Nausea And Vomiting  . Crestor [Rosuvastatin Calcium] Other (See Comments)    Muscle pain  . Escitalopram Oxalate Other (See Comments)    Reaction Unknown   . Lactose Intolerance (Gi) Diarrhea    Can tolerate in small amounts  . Lexapro [Escitalopram] Other (See Comments)    Unknown   . Lipitor [Atorvastatin Calcium] Other (See Comments)    myalgia  . Lopressor [Metoprolol Tartrate] Other (See Comments)    Blood pressure bottoms out   . Metoprolol Tartrate Other (See Comments)    Unknown   . Nitroglycerin Other (See Comments)    Reaction unknown  . Norvasc [Amlodipine Besylate] Other (See Comments)    fatigue  . Nsaids Nausea Only  . Oxycodone Other (See Comments)    Reaction unknown  . Percocet [Oxycodone-Acetaminophen] Nausea And Vomiting  . Prednisone Itching and Swelling  . Sulfa Antibiotics Nausea Only and Other (See Comments)    Makes her stomach hurt  . Tape Other (See Comments)    Skin tears & bruises easily (SKIN IS THIN!!)  . Vimovo [Naproxen-Esomeprazole] Itching, Nausea Only and Swelling  . Zoloft [Sertraline] Other (See Comments)    UNKNOWN   . Penicillins Hives and Rash    Has patient had a PCN reaction causing immediate rash, facial/tongue/throat swelling, SOB or lightheadedness with hypotension: Yes Has patient had a PCN reaction causing severe rash involving mucus membranes or skin necrosis: No Has patient had a PCN reaction that required hospitalization No Has patient had a PCN reaction occurring within the last 10 years: No If all of the above answers are "NO", then may proceed with Cephalosporin use.     Allergies as of 11/07/2016      Reactions   Aspirin Nausea And  Vomiting   Ativan [lorazepam] Other (See Comments)   Hallucinations   Calan [verapamil Hcl] Other (See Comments)   Patient states it makes her "out of her mind"; bp bottoms out (takes diltiazem at home)   Codeine Nausea And Vomiting   Crestor [rosuvastatin Calcium] Other (See Comments)   Muscle pain   Escitalopram Oxalate Other (See Comments)   Reaction Unknown   Lactose Intolerance (gi) Diarrhea   Can tolerate in small amounts   Lexapro [escitalopram] Other (See Comments)   Unknown   Lipitor [atorvastatin Calcium] Other (See Comments)   myalgia   Lopressor [metoprolol Tartrate] Other (See Comments)  Blood pressure bottoms out   Metoprolol Tartrate Other (See Comments)   Unknown   Nitroglycerin Other (See Comments)   Reaction unknown   Norvasc [amlodipine Besylate] Other (See Comments)   fatigue   Nsaids Nausea Only   Oxycodone Other (See Comments)   Reaction unknown   Percocet [oxycodone-acetaminophen] Nausea And Vomiting   Prednisone Itching, Swelling   Sulfa Antibiotics Nausea Only, Other (See Comments)   Makes her stomach hurt   Tape Other (See Comments)   Skin tears & bruises easily (SKIN IS THIN!!)   Vimovo [naproxen-esomeprazole] Itching, Nausea Only, Swelling   Zoloft [sertraline] Other (See Comments)   UNKNOWN   Penicillins Hives, Rash   Has patient had a PCN reaction causing immediate rash, facial/tongue/throat swelling, SOB or lightheadedness with hypotension: Yes Has patient had a PCN reaction causing severe rash involving mucus membranes or skin necrosis: No Has patient had a PCN reaction that required hospitalization No Has patient had a PCN reaction occurring within the last 10 years: No If all of the above answers are "NO", then may proceed with Cephalosporin use.      Medication List       Accurate as of 11/07/16 11:59 PM. Always use your most recent med list.          acetaminophen 325 MG tablet Commonly known as:  TYLENOL Take 650 mg by mouth  4 (four) times daily.   ALPRAZolam 0.25 MG tablet Commonly known as:  XANAX Take 0.25 mg by mouth 2 (two) times daily.   CHLORASEPTIC 6-10 MG lozenge Generic drug:  benzocaine-menthol Take 1 lozenge by mouth every 2 (two) hours as needed for sore throat.   clopidogrel 75 MG tablet Commonly known as:  PLAVIX Take 1 tablet (75 mg total) by mouth daily.   DIABETIC TUSSIN DM 10-100 MG/5ML liquid Generic drug:  dextromethorphan-guaiFENesin Take 10 mLs by mouth every 6 (six) hours as needed for cough.   divalproex 125 MG capsule Commonly known as:  DEPAKOTE SPRINKLE Take 250 mg by mouth 2 (two) times daily.   food thickener Powd Commonly known as:  THICK IT Take 1 g by mouth as needed (per SLP).   haloperidol 0.5 MG tablet Commonly known as:  HALDOL Take 0.5 mg by mouth every 8 (eight) hours as needed for agitation.   mirtazapine 15 MG tablet Commonly known as:  REMERON Take 15 mg by mouth at bedtime.   morphine 20 MG/ML concentrated solution Commonly known as:  ROXANOL Take 0.5 mLs (10 mg total) by mouth every 4 (four) hours as needed for severe pain.   OVER THE COUNTER MEDICATION "End It Ointment": Apply to irritated areas daily (as needed)   oxybutynin 15 MG 24 hr tablet Commonly known as:  DITROPAN XL Take 15 mg by mouth every morning.   senna 8.6 MG Tabs tablet Commonly known as:  SENOKOT Take 1 tablet by mouth 2 (two) times daily.   TAZTIA XT 180 MG 24 hr capsule Generic drug:  diltiazem Take 180 mg by mouth every morning.       Review of Systems  Unable to perform ROS: Dementia  Constitutional: Negative for activity change, appetite change and fever.  HENT: Negative for congestion, trouble swallowing and voice change.   Respiratory: Negative for apnea, cough, choking, shortness of breath and wheezing.   Cardiovascular: Negative for chest pain, palpitations and leg swelling.  Gastrointestinal: Negative for abdominal distention and abdominal pain.   Musculoskeletal: Positive for arthralgias (typical arthritis). Negative for back  pain, gait problem and myalgias.  Skin: Negative for color change, pallor, rash and wound.  Psychiatric/Behavioral: Positive for behavioral problems and confusion. Negative for agitation.  All other systems reviewed and are negative.   Immunization History  Administered Date(s) Administered  . Influenza Split 09/11/2012  . Influenza Whole 10/15/2010  . Influenza-Unspecified 08/10/2013  . PPD Test 08/24/2013  . Pneumococcal Polysaccharide-23 10/15/2010  . Pneumococcal-Unspecified 08/10/2013  . Tdap 10/01/2013   Pertinent  Health Maintenance Due  Topic Date Due  . FOOT EXAM  06/18/1946  . OPHTHALMOLOGY EXAM  06/18/1946  . URINE MICROALBUMIN  06/18/1946  . PNA vac Low Risk Adult (2 of 2 - PCV13) 08/10/2014  . INFLUENZA VACCINE  06/11/2016  . HEMOGLOBIN A1C  03/17/2017  . DEXA SCAN  Completed   Fall Risk  05/18/2013  Falls in the past year? Yes  Number falls in past yr: 1  Injury with Fall? No  Risk for fall due to : Impaired balance/gait;Impaired mobility   Functional Status Survey:    Vitals:   11/07/16 0600  Pulse: (!) 108   There is no height or weight on file to calculate BMI. Physical Exam  Constitutional: She is oriented to person, place, and time. Vital signs are normal. She appears well-developed and well-nourished. She is active and cooperative. She does not appear ill. No distress.  HENT:  Head: Normocephalic and atraumatic.  Mouth/Throat: Uvula is midline, oropharynx is clear and moist and mucous membranes are normal. Mucous membranes are not pale, not dry and not cyanotic.  Eyes: EOM and lids are normal.  Neck: Trachea normal, normal range of motion and full passive range of motion without pain. No JVD present. No tracheal deviation, no edema and no erythema present.  Cardiovascular: Regular rhythm, intact distal pulses and normal pulses.  Tachycardia present.  Exam reveals no  gallop, no distant heart sounds and no friction rub.   No murmur heard. Pulmonary/Chest: Effort normal. No accessory muscle usage. No respiratory distress. She has decreased breath sounds in the right lower field and the left lower field. She has no wheezes. She has no rhonchi. She has no rales. She exhibits no tenderness.  Abdominal: Soft. Normal appearance and bowel sounds are normal. She exhibits no distension and no ascites. There is no tenderness. There is no CVA tenderness.  Musculoskeletal: She exhibits no edema.  Expected osteoarthritis, stiffness  Neurological: She is alert and oriented to person, place, and time. She has normal strength.  Skin: Skin is warm, dry and intact. No rash noted. She is not diaphoretic. No cyanosis or erythema. No pallor. Nails show no clubbing.  Psychiatric: Her speech is normal. Her affect is labile. She is agitated. Thought content is delusional. Cognition and memory are impaired. She expresses impulsivity and inappropriate judgment. She exhibits abnormal recent memory and abnormal remote memory.  Nursing note and vitals reviewed.   Labs reviewed:  Recent Labs  04/28/16 0450 06/05/16 0726 09/16/16 2056 09/16/16 2103  NA 138 140 142 140  K 4.4 3.5 4.1 4.1  CL 105 102 107 105  CO2 24 30  --  25  GLUCOSE 85 125* 116* 126*  BUN 33* 26* 32* 26*  CREATININE 0.79 0.80 1.00 1.15*  CALCIUM 9.3 9.7  --  9.9    Recent Labs  02/04/16 0525 04/28/16 0450 09/16/16 2103  AST 29 22 29   ALT 12* 8* 14  ALKPHOS 65 57 84  BILITOT 1.2 0.5 1.0  PROT 6.9 6.7 8.0  ALBUMIN 3.5  3.9 4.4    Recent Labs  04/28/16 0450 06/05/16 0726 09/16/16 2056 09/16/16 2103  WBC 5.7 5.4  --  5.6  NEUTROABS 3.2 2.6  --  3.4  HGB 11.9* 13.7 14.6 15.0  HCT 35.5 39.9 43.0 44.2  MCV 99.6 98.6  --  98.0  PLT 163 201  --  194   Lab Results  Component Value Date   TSH 1.674 02/07/2015   Lab Results  Component Value Date   HGBA1C 5.6 09/17/2016   Lab Results    Component Value Date   CHOL 288 (H) 09/17/2016   HDL 43 09/17/2016   LDLCALC 207 (H) 09/17/2016   TRIG 190 (H) 09/17/2016   CHOLHDL 6.7 09/17/2016    Significant Diagnostic Results in last 30 days:  No results found.  Assessment/Plan 1. Delirium  Fall precautions  Monitor for safety  Haldol 2.5 mg IM x 1 for agitation  Depakote 250 mg po TID  2. Urinary tract infection without hematuria, site unspecified  Bactrim DS 1 tablet po Q 12 hours x 5 days  Pepcid 40 mg po BID x 5 days- for use while on antibiotics  Encourage po fluid intake  Good peri-care  Family/ staff Communication:   Total Time:  Documentation:  Face to Face:  Family/Phone:   Labs/tests ordered:  Ua, c&s  Medication list reviewed and assessed for continued appropriateness.  Brynda Rim, NP-C Geriatrics [facility] [phone] [place], [place] [address] Cell Phone (Mon-Fri 8am-5pm):  [phone] On Call:  [phone] & follow prompts after 5pm & weekends Office Phone:  [phone] Office Fax:  [phone]

## 2016-11-12 LAB — GLUCOSE, CAPILLARY: Glucose-Capillary: 101 mg/dL — ABNORMAL HIGH (ref 65–99)

## 2016-11-13 LAB — GLUCOSE, CAPILLARY: GLUCOSE-CAPILLARY: 86 mg/dL (ref 65–99)

## 2016-11-14 LAB — GLUCOSE, CAPILLARY: Glucose-Capillary: 94 mg/dL (ref 65–99)

## 2016-11-14 NOTE — Progress Notes (Signed)
Location:      Place of Service:  SNF (31) Provider:  [name], NP-C  [name] Patient  Patient Care Team: [name] Patient as PCP - General (General Practice)  Extended Emergency Contact Information Primary Emergency Contact: [name],[name]          Bruna Potter, [place] Phone: [phone] Relation: Daughter Secondary Emergency Contact: [name] Address: [address]          [place], [place] [address] [place] of [place] Home Phone: [phone] Mobile Phone: [phone] Relation: Daughter  Code Status:  dnr Goals of care: Advanced Directive information Advanced Directives 09/16/2016  Does Patient Have a Medical Advance Directive? Yes  Type of Advance Directive Out of facility DNR (pink MOST or yellow form)  Does patient want to make changes to medical advance directive? -  Copy of Healthcare Power of Attorney in Chart? -  Would patient like information on creating a medical advance directive? -  Pre-existing out of facility DNR order (yellow form or pink MOST form) -     Chief Complaint  Patient presents with  . Acute Visit    HPI:  Pt is a 81 y.o. female seen today for an acute visit for foul vaginal discharge and increased confusion. Staff obtained a urine sample and sent it for evaluation. It was growing Klebsiella Pneumonia and Proteus Mirabilis. Pt denies n/v/d/f/c/cp/sob/ha/abd pain/dizziness. Denies CVA tenderness. Hospice staff report strong odor and dark urine. No other complaints. VSS    Past Medical History:  Diagnosis Date  . Anemia    "as a child" (08/05/2013)  . Anxiety   . Arthritis    "all over me; in all my joints" (08/05/2013)  . Atrial fibrillation (HCC)    a. Chronic; No longer on coumadin 2/2 frequent falls.  . Chest pain    a. 08/2011 Myoview: sm, fixed anteroseptal defect (attenuation), no ischemia, EF 62%.  . CHF (congestive heart failure) (HCC)   . Coccyx pain   . COPD (chronic obstructive  pulmonary disease) (HCC)   . Dementia   . Depression   . Falls frequently    coumadin stopped  . GERD (gastroesophageal reflux disease)   . Gout    "right foot" (08/05/2013)  . H/O hiatal hernia   . History of pneumonia    "couple times; long time ago" (08/05/2013)  . Hypercholesterolemia   . Hypertension   . Hypertrophic cardiomyopathy (HCC)    a. 11/2013 Echo: EF 55-60%, basal inf HK, sev dil LA, no evidence of HCM.  PASP .  [name] Hyponatremia   . Hyposmolality   . Osteoarthritis   . Oxygen dependent    "3L 24/7" (08/05/2013)  . Pacemaker    a. 04/2012 MDT [id number] [employer] PPM, ser #: [id number] H.  . Pulmonary HTN    a. has had prior right heart cath in 2010; was felt that most likely due to elevated left sided pressures and would not benefit from vasodilator therapy;  b. 11/2013 Echo: PASP .  . Situational mixed anxiety and depressive disorder   . TIA (transient ischemic attack)   . Type II diabetes mellitus (HCC)   . Varicose veins    Past Surgical History:  Procedure Laterality Date  . ABDOMINAL HYSTERECTOMY     "partial" (08/05/2013)  . APPENDECTOMY    . BACK SURGERY     "bone spur removed; mid back" (08/05/2013)  . CARDIAC CATHETERIZATION     "more than once" (08/05/2013)  .  DILATION AND CURETTAGE OF UTERUS     "several" (08/05/2013)  . FOOT NEUROMA SURGERY Right   . INSERT / REPLACE / REMOVE PACEMAKER  1998; 2000's; 2014  . PACEMAKER GENERATOR CHANGE N/A 04/24/2012   Procedure: PACEMAKER GENERATOR CHANGE;  Surgeon: Duke Salvia, MD;  Location: [facility] CATH LAB;  Service: Cardiovascular;  Laterality: N/A;  . TONSILLECTOMY    . TOTAL KNEE ARTHROPLASTY Right 2011    Allergies  Allergen Reactions  . Aspirin Nausea And Vomiting  . Ativan [Lorazepam] Other (See Comments)    Hallucinations  . Calan [Verapamil Hcl] Other (See Comments)    Patient states it makes her "out of her mind"; bp bottoms out (takes diltiazem at home)  . Codeine Nausea And Vomiting  . Crestor  [Rosuvastatin Calcium] Other (See Comments)    Muscle pain  . Escitalopram Oxalate Other (See Comments)    Reaction Unknown   . Lactose Intolerance (Gi) Diarrhea    Can tolerate in small amounts  . Lexapro [Escitalopram] Other (See Comments)    Unknown   . Lipitor [Atorvastatin Calcium] Other (See Comments)    myalgia  . Lopressor [Metoprolol Tartrate] Other (See Comments)    Blood pressure bottoms out   . Metoprolol Tartrate Other (See Comments)    Unknown   . Nitroglycerin Other (See Comments)    Reaction unknown  . Norvasc [Amlodipine Besylate] Other (See Comments)    fatigue  . Nsaids Nausea Only  . Oxycodone Other (See Comments)    Reaction unknown  . Percocet [Oxycodone-Acetaminophen] Nausea And Vomiting  . Prednisone Itching and Swelling  . Sulfa Antibiotics Nausea Only and Other (See Comments)    Makes her stomach hurt  . Tape Other (See Comments)    Skin tears & bruises easily (SKIN IS THIN!!)  . Vimovo [Naproxen-Esomeprazole] Itching, Nausea Only and Swelling  . Zoloft [Sertraline] Other (See Comments)    UNKNOWN   . Penicillins Hives and Rash    Has patient had a PCN reaction causing immediate rash, facial/tongue/throat swelling, SOB or lightheadedness with hypotension: Yes Has patient had a PCN reaction causing severe rash involving mucus membranes or skin necrosis: No Has patient had a PCN reaction that required hospitalization No Has patient had a PCN reaction occurring within the last 10 years: No If all of the above answers are "NO", then may proceed with Cephalosporin use.     Allergies as of 10/30/2016      Reactions   Aspirin Nausea And Vomiting   Ativan [lorazepam] Other (See Comments)   Hallucinations   Calan [verapamil Hcl] Other (See Comments)   Patient states it makes her "out of her mind"; bp bottoms out (takes diltiazem at home)   Codeine Nausea And Vomiting   Crestor [rosuvastatin Calcium] Other (See Comments)   Muscle pain    Escitalopram Oxalate Other (See Comments)   Reaction Unknown   Lactose Intolerance (gi) Diarrhea   Can tolerate in small amounts   Lexapro [escitalopram] Other (See Comments)   Unknown   Lipitor [atorvastatin Calcium] Other (See Comments)   myalgia   Lopressor [metoprolol Tartrate] Other (See Comments)   Blood pressure bottoms out   Metoprolol Tartrate Other (See Comments)   Unknown   Nitroglycerin Other (See Comments)   Reaction unknown   Norvasc [amlodipine Besylate] Other (See Comments)   fatigue   Nsaids Nausea Only   Oxycodone Other (See Comments)   Reaction unknown   Percocet [oxycodone-acetaminophen] Nausea And Vomiting   Prednisone Itching, Swelling  Sulfa Antibiotics Nausea Only, Other (See Comments)   Makes her stomach hurt   Tape Other (See Comments)   Skin tears & bruises easily (SKIN IS THIN!!)   Vimovo [naproxen-esomeprazole] Itching, Nausea Only, Swelling   Zoloft [sertraline] Other (See Comments)   UNKNOWN   Penicillins Hives, Rash   Has patient had a PCN reaction causing immediate rash, facial/tongue/throat swelling, SOB or lightheadedness with hypotension: Yes Has patient had a PCN reaction causing severe rash involving mucus membranes or skin necrosis: No Has patient had a PCN reaction that required hospitalization No Has patient had a PCN reaction occurring within the last 10 years: No If all of the above answers are "NO", then may proceed with Cephalosporin use.      Medication List       Accurate as of 10/30/16 11:59 PM. Always use your most recent med list.          acetaminophen 325 MG tablet Commonly known as:  TYLENOL Take 650 mg by mouth 4 (four) times daily.   ALPRAZolam 0.25 MG tablet Commonly known as:  XANAX Take 0.25 mg by mouth 2 (two) times daily.   CHLORASEPTIC 6-10 MG lozenge Generic drug:  benzocaine-menthol Take 1 lozenge by mouth every 2 (two) hours as needed for sore throat.   clopidogrel 75 MG tablet Commonly known as:   PLAVIX Take 1 tablet (75 mg total) by mouth daily.   DIABETIC TUSSIN DM 10-100 MG/5ML liquid Generic drug:  dextromethorphan-guaiFENesin Take 10 mLs by mouth every 6 (six) hours as needed for cough.   divalproex 125 MG capsule Commonly known as:  DEPAKOTE SPRINKLE Take 250 mg by mouth 2 (two) times daily.   food thickener Powd Commonly known as:  THICK IT Take 1 g by mouth as needed (per SLP).   haloperidol 0.5 MG tablet Commonly known as:  HALDOL Take 0.5 mg by mouth every 8 (eight) hours as needed for agitation.   mirtazapine 15 MG tablet Commonly known as:  REMERON Take 15 mg by mouth at bedtime.   morphine 20 MG/ML concentrated solution Commonly known as:  ROXANOL Take 0.5 mLs (10 mg total) by mouth every 4 (four) hours as needed for severe pain.   OVER THE COUNTER MEDICATION "End It Ointment": Apply to irritated areas daily (as needed)   oxybutynin 15 MG 24 hr tablet Commonly known as:  DITROPAN XL Take 15 mg by mouth every morning.   senna 8.6 MG Tabs tablet Commonly known as:  SENOKOT Take 1 tablet by mouth 2 (two) times daily.   TAZTIA XT 180 MG 24 hr capsule Generic drug:  diltiazem Take 180 mg by mouth every morning.       Review of Systems  Unable to perform ROS: Dementia  Constitutional: Negative for activity change, appetite change and fever.  HENT: Negative for congestion, trouble swallowing and voice change.   Respiratory: Negative for apnea, cough, choking, shortness of breath and wheezing.   Cardiovascular: Negative for chest pain, palpitations and leg swelling.  Gastrointestinal: Negative for abdominal distention and abdominal pain.  Genitourinary: Negative for difficulty urinating, dysuria, flank pain, hematuria, pelvic pain and urgency.  Musculoskeletal: Positive for arthralgias (typical arthritis). Negative for back pain, gait problem and myalgias.  Skin: Negative for color change, pallor, rash and wound.  Psychiatric/Behavioral: Negative  for agitation and behavioral problems.  All other systems reviewed and are negative.   Immunization History  Administered Date(s) Administered  . Influenza Split 09/11/2012  . Influenza Whole 10/15/2010  .  Influenza-Unspecified 08/10/2013  . PPD Test 08/24/2013  . Pneumococcal Polysaccharide-23 10/15/2010  . Pneumococcal-Unspecified 08/10/2013  . Tdap 10/01/2013   Pertinent  Health Maintenance Due  Topic Date Due  . FOOT EXAM  06/18/1946  . OPHTHALMOLOGY EXAM  06/18/1946  . URINE MICROALBUMIN  06/18/1946  . PNA vac Low Risk Adult (2 of 2 - PCV13) 08/10/2014  . INFLUENZA VACCINE  06/11/2016  . HEMOGLOBIN A1C  03/17/2017  . DEXA SCAN  Completed   Fall Risk  05/18/2013  Falls in the past year? Yes  Number falls in past yr: 1  Injury with Fall? No  Risk for fall due to : Impaired balance/gait;Impaired mobility   Functional Status Survey:    Vitals:   10/30/16 0700  Pulse: 72  Temp: 97 F (36.1 C)   There is no height or weight on file to calculate BMI. Physical Exam  Constitutional: She is oriented to person, place, and time. Vital signs are normal. She appears well-developed and well-nourished. She is active and cooperative. She does not appear ill. No distress.  HENT:  Head: Normocephalic and atraumatic.  Mouth/Throat: Uvula is midline, oropharynx is clear and moist and mucous membranes are normal. Mucous membranes are not pale, not dry and not cyanotic.  Eyes: EOM and lids are normal.  Neck: Trachea normal, normal range of motion and full passive range of motion without pain. No JVD present. No tracheal deviation, no edema and no erythema present.  Cardiovascular: Normal rate, regular rhythm, intact distal pulses and normal pulses.  Exam reveals no gallop, no distant heart sounds and no friction rub.   No murmur heard. Pulmonary/Chest: Effort normal. No accessory muscle usage. No respiratory distress. She has no decreased breath sounds. She has no wheezes. She has no  rhonchi. She has no rales. She exhibits no tenderness.  Abdominal: Soft. Normal appearance and bowel sounds are normal. She exhibits no distension and no ascites. There is no tenderness. There is no CVA tenderness.  Musculoskeletal: She exhibits no edema.  Expected osteoarthritis, stiffness  Neurological: She is alert and oriented to person, place, and time. She has normal strength.  Skin: Skin is warm, dry and intact. No rash noted. She is not diaphoretic. No cyanosis or erythema. No pallor. Nails show no clubbing.  Psychiatric: She has a normal mood and affect. Her speech is normal and behavior is normal. Judgment and thought content normal. Cognition and memory are normal.  Nursing note and vitals reviewed.   Labs reviewed:  Recent Labs  04/28/16 0450 06/05/16 0726 09/16/16 2056 09/16/16 2103  NA 138 140 142 140  K 4.4 3.5 4.1 4.1  CL 105 102 107 105  CO2 24 30  --  25  GLUCOSE 85 125* 116* 126*  BUN 33* 26* 32* 26*  CREATININE 0.79 0.80 1.00 1.15*  CALCIUM 9.3 9.7  --  9.9    Recent Labs  02/04/16 0525 04/28/16 0450 09/16/16 2103  AST 29 22 29   ALT 12* 8* 14  ALKPHOS 65 57 84  BILITOT 1.2 0.5 1.0  PROT 6.9 6.7 8.0  ALBUMIN 3.5 3.9 4.4    Recent Labs  04/28/16 0450 06/05/16 0726 09/16/16 2056 09/16/16 2103  WBC 5.7 5.4  --  5.6  NEUTROABS 3.2 2.6  --  3.4  HGB 11.9* 13.7 14.6 15.0  HCT 35.5 39.9 43.0 44.2  MCV 99.6 98.6  --  98.0  PLT 163 201  --  194   Lab Results  Component Value Date  TSH 1.674 02/07/2015   Lab Results  Component Value Date   HGBA1C 5.6 09/17/2016   Lab Results  Component Value Date   CHOL 288 (H) 09/17/2016   HDL 43 09/17/2016   LDLCALC 207 (H) 09/17/2016   TRIG 190 (H) 09/17/2016   CHOLHDL 6.7 09/17/2016    Significant Diagnostic Results in last 30 days:  No results found.  Assessment/Plan 1. Urinary tract infection without hematuria, site unspecified  Cipro 250 mg po Q 12 hours x 5 days  Increase po fluid  intake  Good peri- care  Family/ staff Communication:   Total Time:  Documentation:  Face to Face:  Family/Phone:   Labs/tests ordered:  Ua, u&s  Medication list reviewed and assessed for continued appropriateness.  Brynda Rim, NP-C Geriatrics [facility] [phone], [place] [address] Cell Phone (Mon-Fri 8am-5pm):  [phone] On Call:  [phone] & follow prompts after 5pm & weekends Office Phone:  [phone] Office Fax:  [phone]

## 2016-11-15 LAB — GLUCOSE, CAPILLARY: Glucose-Capillary: 107 mg/dL — ABNORMAL HIGH (ref 65–99)

## 2016-11-16 LAB — GLUCOSE, CAPILLARY: GLUCOSE-CAPILLARY: 97 mg/dL (ref 65–99)

## 2016-11-19 LAB — GLUCOSE, CAPILLARY: Glucose-Capillary: 113 mg/dL — ABNORMAL HIGH (ref 65–99)

## 2016-11-23 LAB — GLUCOSE, CAPILLARY: GLUCOSE-CAPILLARY: 95 mg/dL (ref 65–99)

## 2016-11-24 LAB — GLUCOSE, CAPILLARY: GLUCOSE-CAPILLARY: 108 mg/dL — AB (ref 65–99)

## 2016-11-25 LAB — GLUCOSE, CAPILLARY: Glucose-Capillary: 106 mg/dL — ABNORMAL HIGH (ref 65–99)

## 2016-11-28 LAB — GLUCOSE, CAPILLARY: Glucose-Capillary: 123 mg/dL — ABNORMAL HIGH (ref 65–99)

## 2016-11-29 LAB — GLUCOSE, CAPILLARY: Glucose-Capillary: 105 mg/dL — ABNORMAL HIGH (ref 65–99)

## 2016-12-03 LAB — GLUCOSE, CAPILLARY: GLUCOSE-CAPILLARY: 140 mg/dL — AB (ref 65–99)

## 2016-12-04 LAB — GLUCOSE, CAPILLARY: GLUCOSE-CAPILLARY: 109 mg/dL — AB (ref 65–99)

## 2016-12-07 LAB — GLUCOSE, CAPILLARY: Glucose-Capillary: 107 mg/dL — ABNORMAL HIGH (ref 65–99)

## 2016-12-08 LAB — GLUCOSE, CAPILLARY: GLUCOSE-CAPILLARY: 136 mg/dL — AB (ref 65–99)

## 2016-12-09 LAB — GLUCOSE, CAPILLARY: Glucose-Capillary: 138 mg/dL — ABNORMAL HIGH (ref 65–99)

## 2016-12-12 ENCOUNTER — Encounter
Admission: RE | Admit: 2016-12-12 | Discharge: 2016-12-12 | Disposition: A | Discharge status: Home / Self Care | Payer: Medicare Other | Source: Ambulatory Visit | Attending: Internal Medicine | Admitting: Internal Medicine

## 2016-12-13 LAB — GLUCOSE, CAPILLARY: Glucose-Capillary: 110 mg/dL — ABNORMAL HIGH (ref 65–99)

## 2016-12-17 LAB — GLUCOSE, CAPILLARY: Glucose-Capillary: 114 mg/dL — ABNORMAL HIGH (ref 65–99)

## 2016-12-18 LAB — GLUCOSE, CAPILLARY: Glucose-Capillary: 109 mg/dL — ABNORMAL HIGH (ref 65–99)

## 2016-12-21 LAB — GLUCOSE, CAPILLARY: GLUCOSE-CAPILLARY: 103 mg/dL — AB (ref 65–99)

## 2016-12-22 LAB — GLUCOSE, CAPILLARY: Glucose-Capillary: 116 mg/dL — ABNORMAL HIGH (ref 65–99)

## 2016-12-23 LAB — GLUCOSE, CAPILLARY: GLUCOSE-CAPILLARY: 110 mg/dL — AB (ref 65–99)

## 2016-12-26 LAB — GLUCOSE, CAPILLARY: Glucose-Capillary: 108 mg/dL — ABNORMAL HIGH (ref 65–99)

## 2016-12-27 LAB — GLUCOSE, CAPILLARY: GLUCOSE-CAPILLARY: 124 mg/dL — AB (ref 65–99)

## 2016-12-31 LAB — GLUCOSE, CAPILLARY: Glucose-Capillary: 115 mg/dL — ABNORMAL HIGH (ref 65–99)

## 2017-01-01 LAB — GLUCOSE, CAPILLARY: GLUCOSE-CAPILLARY: 213 mg/dL — AB (ref 65–99)

## 2017-01-02 ENCOUNTER — Non-Acute Institutional Stay (SKILLED_NURSING_FACILITY): Payer: Medicare Other | Admitting: Gerontology

## 2017-01-02 DIAGNOSIS — J449 Chronic obstructive pulmonary disease, unspecified: Secondary | ICD-10-CM

## 2017-01-02 DIAGNOSIS — L600 Ingrowing nail: Secondary | ICD-10-CM

## 2017-01-02 NOTE — Progress Notes (Signed)
Location:      Place of Service:  SNF (31) Provider:  [name], NP-C  [name] Patient  Patient Care Team: [name] Patient as PCP - General (General Practice)  Extended Emergency Contact Information Primary Emergency Contact: [name],[name]          Bruna Potter, [place] Phone: [phone] Relation: Daughter Secondary Emergency Contact: [name] Address: [address]          [place], [place] [address] [place] Home Phone: [phone] Mobile Phone: [phone] Relation: Daughter  Code Status:  dnr Goals of care: Advanced Directive information Advanced Directives 09/16/2016  Does Patient Have a Medical Advance Directive? Yes  Type of Advance Directive Out of facility DNR (pink MOST or yellow form)  Does patient want to make changes to medical advance directive? -  Copy of Healthcare Power of Attorney in Chart? -  Would patient like information on creating a medical advance directive? -  Pre-existing out of facility DNR order (yellow form or pink MOST form) -     Chief Complaint  Patient presents with  . Follow-up    HPI:  Pt is a 81 y.o. female seen today for medical management of chronic diseases. She is seen for evaluation of stability of COPD. Pt is not on any medications for management of symptom management. She occasionally receives oxygen for respiratory support. Pt currently denies chest pain or shortness of breath. No cough, no congestion, no sputum. Pt also seen today for re-evaluation of right great toe. Pt was seen two weeks ago by podiatrist for ingrown toenail. The affected toenail was trimmed and debrided of the pus and exudate. Nursing has been performing daily dressing changes with cleansing of the toe with normal saline followed by application of triple antibiotic ointment and bandaid. Today, the nail bed is beefy red with healthy appearing tissue. No bleeding, no drainage. No odor. Pt denies pain. Pt has  dementia making a complete ROS difficult. VSS. No other complaints.     Past Medical History:  Diagnosis Date  . Anemia    "as a child" (08/05/2013)  . Anxiety   . Arthritis    "all over me; in all my joints" (08/05/2013)  . Atrial fibrillation (HCC)    a. Chronic; No longer on coumadin 2/2 frequent falls.  . Chest pain    a. 08/2011 Myoview: sm, fixed anteroseptal defect (attenuation), no ischemia, EF 62%.  . CHF (congestive heart failure) (HCC)   . Coccyx pain   . COPD (chronic obstructive pulmonary disease) (HCC)   . Dementia   . Depression   . Falls frequently    coumadin stopped  . GERD (gastroesophageal reflux disease)   . Gout    "right foot" (08/05/2013)  . H/O hiatal hernia   . History of pneumonia    "couple times; long time ago" (08/05/2013)  . Hypercholesterolemia   . Hypertension   . Hypertrophic cardiomyopathy (HCC)    a. 11/2013 Echo: EF 55-60%, basal inf HK, sev dil LA, no evidence of HCM.  PASP .  [name] Hyponatremia   . Hyposmolality   . Osteoarthritis   . Oxygen dependent    "3L 24/7" (08/05/2013)  . Pacemaker    a. 04/2012 MDT [id number] [employer] PPM, ser #: [id number] H.  . Pulmonary HTN    a. has had prior right heart cath in 2010; was felt that most likely due to elevated left sided pressures and would not benefit from  vasodilator therapy;  b. 11/2013 Echo: PASP .  . Situational mixed anxiety and depressive disorder   . TIA (transient ischemic attack)   . Type II diabetes mellitus (HCC)   . Varicose veins    Past Surgical History:  Procedure Laterality Date  . ABDOMINAL HYSTERECTOMY     "partial" (08/05/2013)  . APPENDECTOMY    . BACK SURGERY     "bone spur removed; mid back" (08/05/2013)  . CARDIAC CATHETERIZATION     "more than once" (08/05/2013)  . DILATION AND CURETTAGE OF UTERUS     "several" (08/05/2013)  . FOOT NEUROMA SURGERY Right   . INSERT / REPLACE / REMOVE PACEMAKER  1998; 2000's; 2014  . PACEMAKER GENERATOR CHANGE N/A 04/24/2012    Procedure: PACEMAKER GENERATOR CHANGE;  Surgeon: Duke Salvia, MD;  Location: [facility] CATH LAB;  Service: Cardiovascular;  Laterality: N/A;  . TONSILLECTOMY    . TOTAL KNEE ARTHROPLASTY Right 2011    Allergies  Allergen Reactions  . Aspirin Nausea And Vomiting  . Ativan [Lorazepam] Other (See Comments)    Hallucinations  . Calan [Verapamil Hcl] Other (See Comments)    Patient states it makes her "out of her mind"; bp bottoms out (takes diltiazem at home)  . Codeine Nausea And Vomiting  . Crestor [Rosuvastatin Calcium] Other (See Comments)    Muscle pain  . Escitalopram Oxalate Other (See Comments)    Reaction Unknown   . Lactose Intolerance (Gi) Diarrhea    Can tolerate in small amounts  . Lexapro [Escitalopram] Other (See Comments)    Unknown   . Lipitor [Atorvastatin Calcium] Other (See Comments)    myalgia  . Lopressor [Metoprolol Tartrate] Other (See Comments)    Blood pressure bottoms out   . Metoprolol Tartrate Other (See Comments)    Unknown   . Nitroglycerin Other (See Comments)    Reaction unknown  . Norvasc [Amlodipine Besylate] Other (See Comments)    fatigue  . Nsaids Nausea Only  . Oxycodone Other (See Comments)    Reaction unknown  . Percocet [Oxycodone-Acetaminophen] Nausea And Vomiting  . Prednisone Itching and Swelling  . Sulfa Antibiotics Nausea Only and Other (See Comments)    Makes her stomach hurt  . Tape Other (See Comments)    Skin tears & bruises easily (SKIN IS THIN!!)  . Vimovo [Naproxen-Esomeprazole] Itching, Nausea Only and Swelling  . Zoloft [Sertraline] Other (See Comments)    UNKNOWN   . Penicillins Hives and Rash    Has patient had a PCN reaction causing immediate rash, facial/tongue/throat swelling, SOB or lightheadedness with hypotension: Yes Has patient had a PCN reaction causing severe rash involving mucus membranes or skin necrosis: No Has patient had a PCN reaction that required hospitalization No Has patient had a PCN reaction  occurring within the last 10 years: No If all of the above answers are "NO", then may proceed with Cephalosporin use.     Allergies as of 01/02/2017      Reactions   Aspirin Nausea And Vomiting   Ativan [lorazepam] Other (See Comments)   Hallucinations   Calan [verapamil Hcl] Other (See Comments)   Patient states it makes her "out of her mind"; bp bottoms out (takes diltiazem at home)   Codeine Nausea And Vomiting   Crestor [rosuvastatin Calcium] Other (See Comments)   Muscle pain   Escitalopram Oxalate Other (See Comments)   Reaction Unknown   Lactose Intolerance (gi) Diarrhea   Can tolerate in small amounts   Lexapro [escitalopram]  Other (See Comments)   Unknown   Lipitor [atorvastatin Calcium] Other (See Comments)   myalgia   Lopressor [metoprolol Tartrate] Other (See Comments)   Blood pressure bottoms out   Metoprolol Tartrate Other (See Comments)   Unknown   Nitroglycerin Other (See Comments)   Reaction unknown   Norvasc [amlodipine Besylate] Other (See Comments)   fatigue   Nsaids Nausea Only   Oxycodone Other (See Comments)   Reaction unknown   Percocet [oxycodone-acetaminophen] Nausea And Vomiting   Prednisone Itching, Swelling   Sulfa Antibiotics Nausea Only, Other (See Comments)   Makes her stomach hurt   Tape Other (See Comments)   Skin tears & bruises easily (SKIN IS THIN!!)   Vimovo [naproxen-esomeprazole] Itching, Nausea Only, Swelling   Zoloft [sertraline] Other (See Comments)   UNKNOWN   Penicillins Hives, Rash   Has patient had a PCN reaction causing immediate rash, facial/tongue/throat swelling, SOB or lightheadedness with hypotension: Yes Has patient had a PCN reaction causing severe rash involving mucus membranes or skin necrosis: No Has patient had a PCN reaction that required hospitalization No Has patient had a PCN reaction occurring within the last 10 years: No If all of the above answers are "NO", then may proceed with Cephalosporin use.        Medication List       Accurate as of 01/02/17 10:23 PM. Always use your most recent med list.          acetaminophen 325 MG tablet Commonly known as:  TYLENOL Take 650 mg by mouth 4 (four) times daily.   ALPRAZolam 0.25 MG tablet Commonly known as:  XANAX Take 0.25 mg by mouth 2 (two) times daily.   CHLORASEPTIC 6-10 MG lozenge Generic drug:  benzocaine-menthol Take 1 lozenge by mouth every 2 (two) hours as needed for sore throat.   clopidogrel 75 MG tablet Commonly known as:  PLAVIX Take 1 tablet (75 mg total) by mouth daily.   DIABETIC TUSSIN DM 10-100 MG/5ML liquid Generic drug:  dextromethorphan-guaiFENesin Take 10 mLs by mouth every 6 (six) hours as needed for cough.   divalproex 125 MG capsule Commonly known as:  DEPAKOTE SPRINKLE Take 250 mg by mouth 2 (two) times daily.   food thickener Powd Commonly known as:  THICK IT Take 1 g by mouth as needed (per SLP).   haloperidol 0.5 MG tablet Commonly known as:  HALDOL Take 0.5 mg by mouth every 8 (eight) hours as needed for agitation.   mirtazapine 15 MG tablet Commonly known as:  REMERON Take 15 mg by mouth at bedtime.   morphine 20 MG/ML concentrated solution Commonly known as:  ROXANOL Take 0.5 mLs (10 mg total) by mouth every 4 (four) hours as needed for severe pain.   OVER THE COUNTER MEDICATION "End It Ointment": Apply to irritated areas daily (as needed)   oxybutynin 15 MG 24 hr tablet Commonly known as:  DITROPAN XL Take 15 mg by mouth every morning.   senna 8.6 MG Tabs tablet Commonly known as:  SENOKOT Take 1 tablet by mouth 2 (two) times daily.   TAZTIA XT 180 MG 24 hr capsule Generic drug:  diltiazem Take 180 mg by mouth every morning.       Review of Systems  Unable to perform ROS: Dementia  Constitutional: Negative for activity change, appetite change and fever.  HENT: Negative for congestion, trouble swallowing and voice change.   Respiratory: Negative for apnea, cough, choking,  shortness of breath and wheezing.  Cardiovascular: Negative for chest pain, palpitations and leg swelling.  Gastrointestinal: Negative for abdominal distention and abdominal pain.  Musculoskeletal: Positive for arthralgias (typical arthritis). Negative for back pain, gait problem and myalgias.  Skin: Positive for wound. Negative for color change, pallor and rash.  Psychiatric/Behavioral: Positive for confusion. Negative for agitation and behavioral problems.  All other systems reviewed and are negative.   Immunization History  Administered Date(s) Administered  . Influenza Split 09/11/2012  . Influenza Whole 10/15/2010  . Influenza-Unspecified 08/10/2013  . PPD Test 08/24/2013  . Pneumococcal Polysaccharide-23 10/15/2010  . Pneumococcal-Unspecified 08/10/2013  . Tdap 10/01/2013   Pertinent  Health Maintenance Due  Topic Date Due  . FOOT EXAM  06/18/1946  . OPHTHALMOLOGY EXAM  06/18/1946  . URINE MICROALBUMIN  06/18/1946  . PNA vac Low Risk Adult (2 of 2 - PCV13) 08/10/2014  . INFLUENZA VACCINE  06/11/2016  . HEMOGLOBIN A1C  03/17/2017  . DEXA SCAN  Completed   Fall Risk  05/18/2013  Falls in the past year? Yes  Number falls in past yr: 1  Injury with Fall? No  Risk for fall due to : Impaired balance/gait;Impaired mobility   Functional Status Survey:    Vitals:   12/27/16 0345  BP: (!) 167/80  Pulse: 92  Resp: 16  Temp: 97.9 F (36.6 C)  SpO2: 97%   There is no height or weight on file to calculate BMI. Physical Exam  Constitutional: She is oriented to person, place, and time. Vital signs are normal. She appears well-developed and well-nourished. She is active and cooperative. She does not appear ill. No distress.  HENT:  Head: Normocephalic and atraumatic.  Mouth/Throat: Uvula is midline, oropharynx is clear and moist and mucous membranes are normal. Mucous membranes are not pale, not dry and not cyanotic.  Eyes: EOM and lids are normal.  Neck: Trachea normal,  normal range of motion and full passive range of motion without pain. No JVD present. No tracheal deviation, no edema and no erythema present.  Cardiovascular: Regular rhythm, intact distal pulses and normal pulses.  Tachycardia present.  Exam reveals no gallop, no distant heart sounds and no friction rub.   No murmur heard. Pulmonary/Chest: Effort normal. No accessory muscle usage. No respiratory distress. She has decreased breath sounds in the right lower field and the left lower field. She has no wheezes. She has no rhonchi. She has no rales. She exhibits no tenderness.  Abdominal: Soft. Normal appearance and bowel sounds are normal. She exhibits no distension and no ascites. There is no tenderness. There is no CVA tenderness.  Musculoskeletal: She exhibits no edema.  Expected osteoarthritis, stiffness  Neurological: She is alert and oriented to person, place, and time. She has normal strength.  Skin: Skin is warm, dry and intact. No rash noted. She is not diaphoretic. No cyanosis or erythema. No pallor. Nails show no clubbing.     Psychiatric: Her speech is normal. Her affect is labile. She is agitated. Thought content is delusional. Cognition and memory are impaired. She expresses impulsivity and inappropriate judgment. She exhibits abnormal recent memory and abnormal remote memory.  Nursing note and vitals reviewed.   Labs reviewed:  Recent Labs  04/28/16 0450 06/05/16 0726 09/16/16 2056 09/16/16 2103  NA 138 140 142 140  K 4.4 3.5 4.1 4.1  CL 105 102 107 105  CO2 24 30  --  25  GLUCOSE 85 125* 116* 126*  BUN 33* 26* 32* 26*  CREATININE 0.79 0.80 1.00 1.15*  CALCIUM 9.3 9.7  --  9.9    Recent Labs  02/04/16 0525 04/28/16 0450 09/16/16 2103  AST 29 22 29   ALT 12* 8* 14  ALKPHOS 65 57 84  BILITOT 1.2 0.5 1.0  PROT 6.9 6.7 8.0  ALBUMIN 3.5 3.9 4.4    Recent Labs  04/28/16 0450 06/05/16 0726 09/16/16 2056 09/16/16 2103  WBC 5.7 5.4  --  5.6  NEUTROABS 3.2 2.6   --  3.4  HGB 11.9* 13.7 14.6 15.0  HCT 35.5 39.9 43.0 44.2  MCV 99.6 98.6  --  98.0  PLT 163 201  --  194   Lab Results  Component Value Date   TSH 1.674 02/07/2015   Lab Results  Component Value Date   HGBA1C 5.6 09/17/2016   Lab Results  Component Value Date   CHOL 288 (H) 09/17/2016   HDL 43 09/17/2016   LDLCALC 207 (H) 09/17/2016   TRIG 190 (H) 09/17/2016   CHOLHDL 6.7 09/17/2016    Significant Diagnostic Results in last 30 days:  No results found.  Assessment/Plan 1. Ingrown right big toenail  Continue daily cleansing and dressing changes with antibiotic ointment   Stable   2. Chronic obstructive pulmonary disease, unspecified COPD type (HCC)  Stable  No medication needs at this time  Family/ staff Communication:   Total Time:  Documentation:  Face to Face:  Family/Phone:   Labs/tests ordered:  none  Medication list reviewed and assessed for continued appropriateness. Monthly medication orders reviewed and signed.  Brynda Rim, NP-C Geriatrics [facility] [phone] [address], [place] [address] Cell Phone (Mon-Fri 8am-5pm):  [phone] On Call:  [phone] & follow prompts after 5pm & weekends Office Phone:  [phone] Office Fax:  [phone]

## 2017-01-03 LAB — GLUCOSE, CAPILLARY: GLUCOSE-CAPILLARY: 102 mg/dL — AB (ref 65–99)

## 2017-01-04 LAB — GLUCOSE, CAPILLARY: GLUCOSE-CAPILLARY: 116 mg/dL — AB (ref 65–99)

## 2017-01-05 LAB — GLUCOSE, CAPILLARY: Glucose-Capillary: 120 mg/dL — ABNORMAL HIGH (ref 65–99)

## 2017-01-06 LAB — GLUCOSE, CAPILLARY: GLUCOSE-CAPILLARY: 105 mg/dL — AB (ref 65–99)

## 2017-01-09 ENCOUNTER — Encounter
Admission: RE | Admit: 2017-01-09 | Discharge: 2017-01-09 | Disposition: A | Discharge status: Home / Self Care | Payer: Medicare Other | Source: Ambulatory Visit | Attending: Internal Medicine | Admitting: Internal Medicine

## 2017-01-09 LAB — GLUCOSE, CAPILLARY: Glucose-Capillary: 113 mg/dL — ABNORMAL HIGH (ref 65–99)

## 2017-01-15 LAB — GLUCOSE, CAPILLARY: GLUCOSE-CAPILLARY: 91 mg/dL (ref 65–99)

## 2017-01-29 LAB — GLUCOSE, CAPILLARY: GLUCOSE-CAPILLARY: 106 mg/dL — AB (ref 65–99)

## 2017-02-08 IMAGING — CT CT CERVICAL SPINE W/O CM
2 of 3 series · 13 of 27 positions shown, 16 images · non-contrast
Comparison: Head CT 03/06/2015

CLINICAL DATA: Pt states she fell on top of her head. Staff
witnessed fall-patient slid out of wheelchair and hit her head.

EXAM:
CT HEAD WITHOUT CONTRAST
CT CERVICAL SPINE WITHOUT CONTRAST
TECHNIQUE: Multidetector CT imaging of the head and cervical spine was
performed following the standard protocol without intravenous
contrast. Multiplanar CT image reconstructions of the cervical spine
were also generated.

[Series 5: soft tissue · axial · 0.41mm/px · z∈[-194,-68]mm · 8 of 81 slices shown, 10 images]
[im 9/81  soft-tissue]
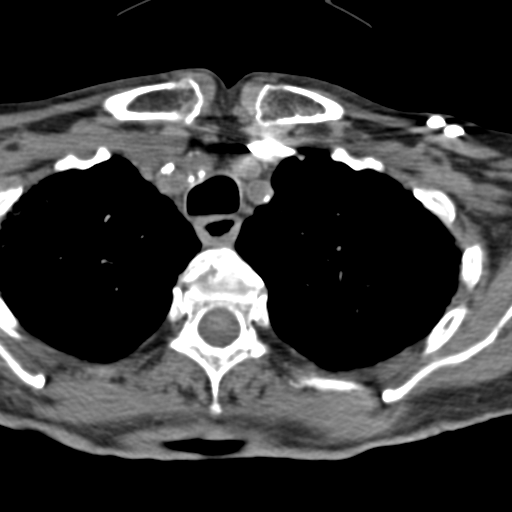
[im 9/81  bone]
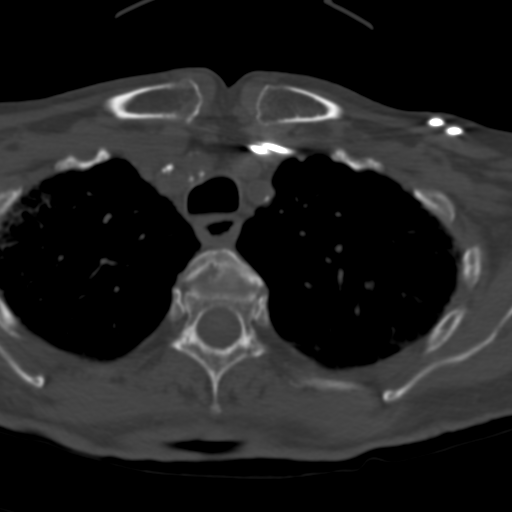
[im 18/81  bone]
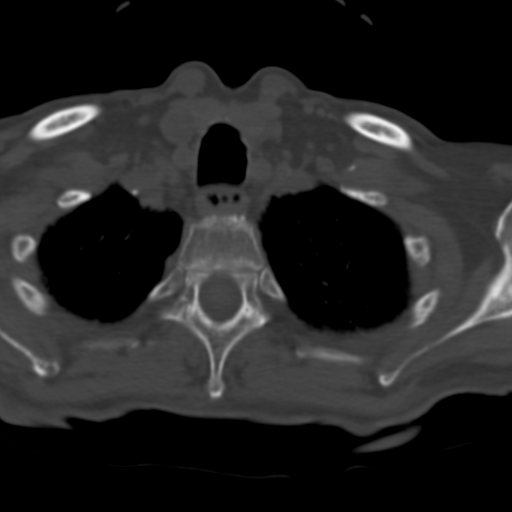
[im 27/81  bone]
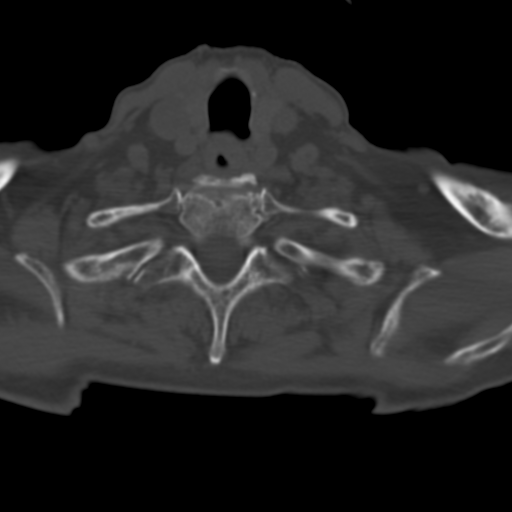
[im 36/81  bone]
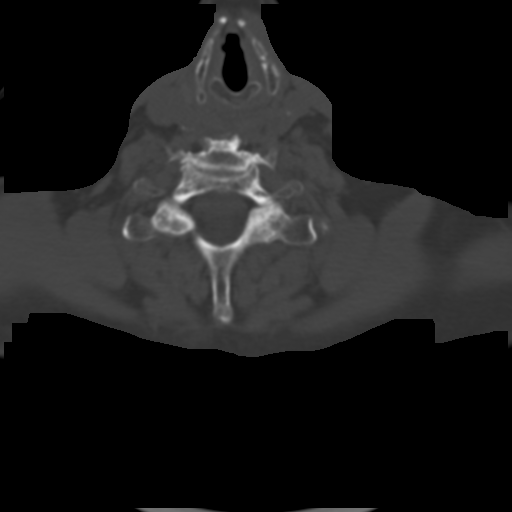
[im 45/81  soft-tissue]
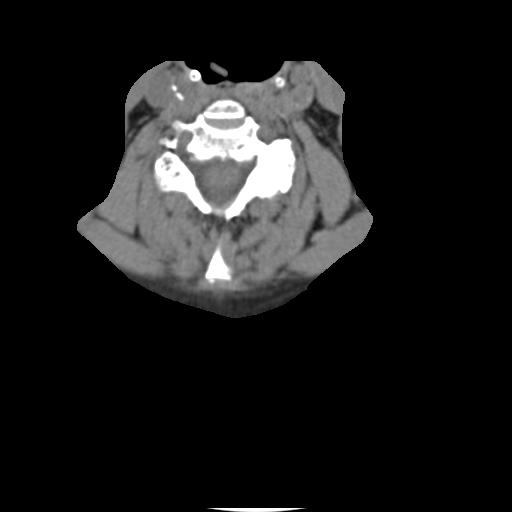
[im 45/81  bone]
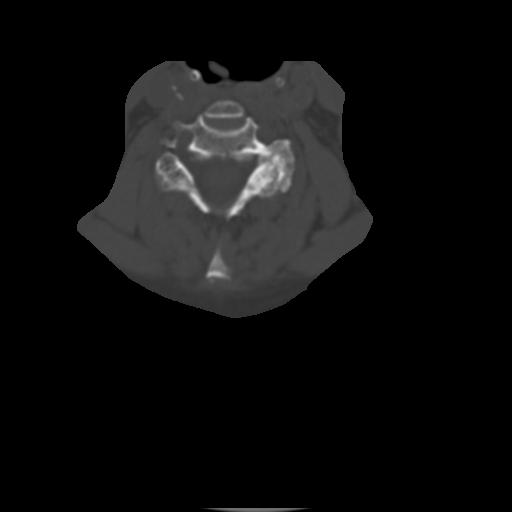
[im 54/81  bone]
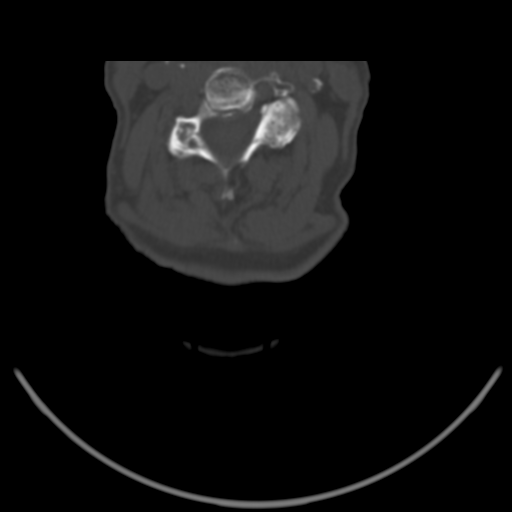
[im 63/81  bone]
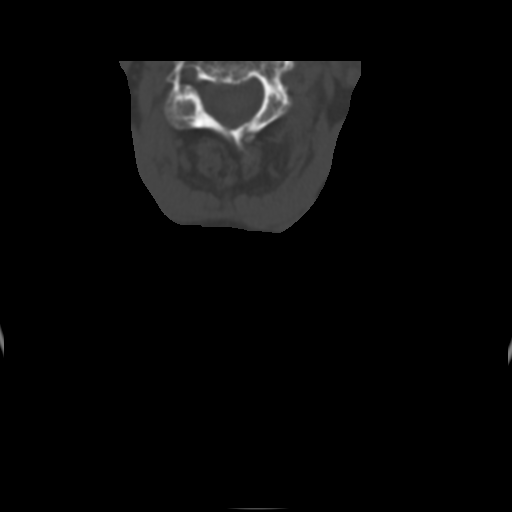
[im 72/81  bone]
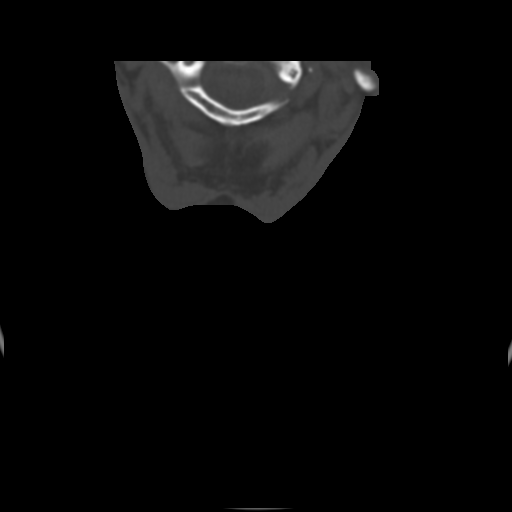

[Series 6: sagittal bone · sagittal · 0.31mm/px · 5 of 76 slices shown, 6 images]
[im 26/76  bone]
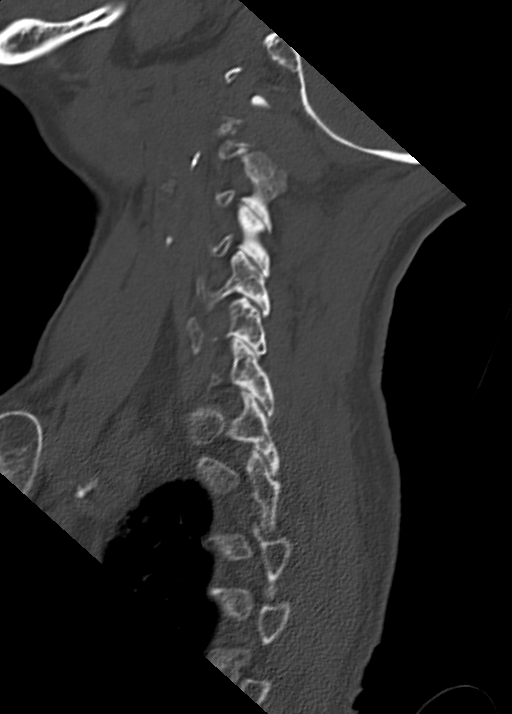
[im 32/76  bone]
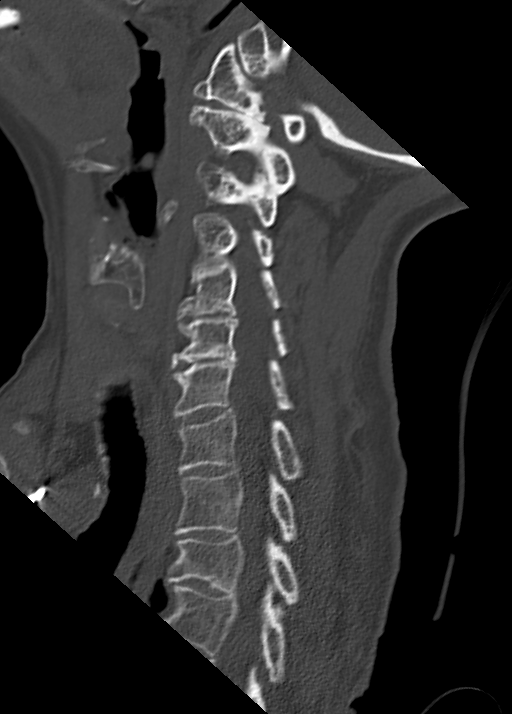
[im 38/76  soft-tissue]
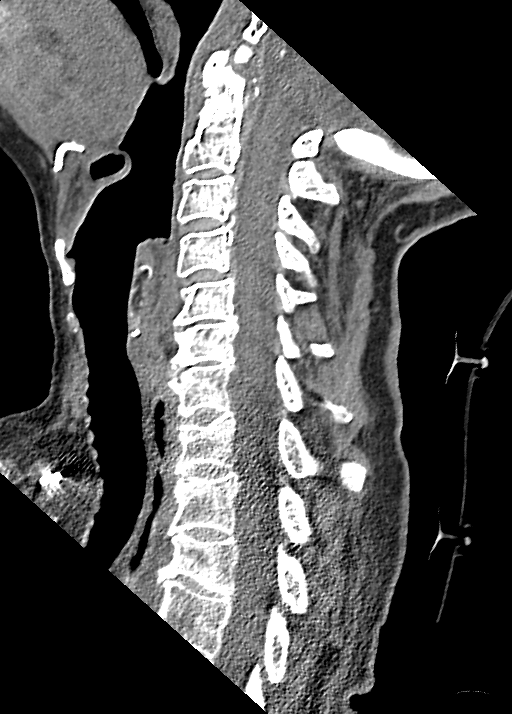
[im 38/76  bone]
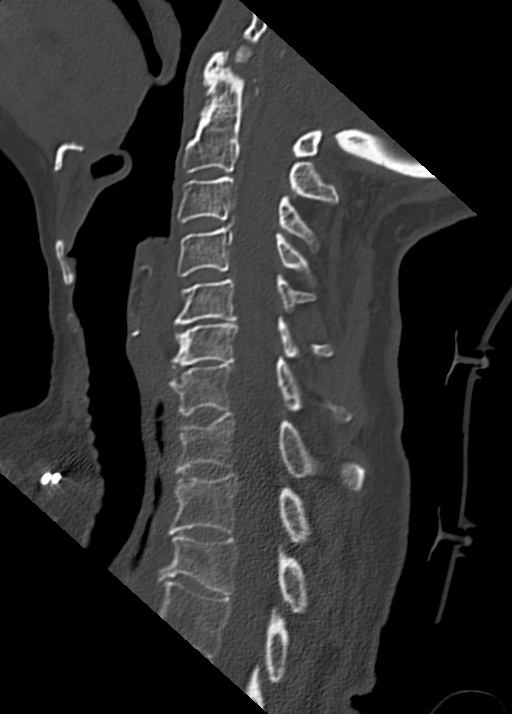
[im 44/76  bone]
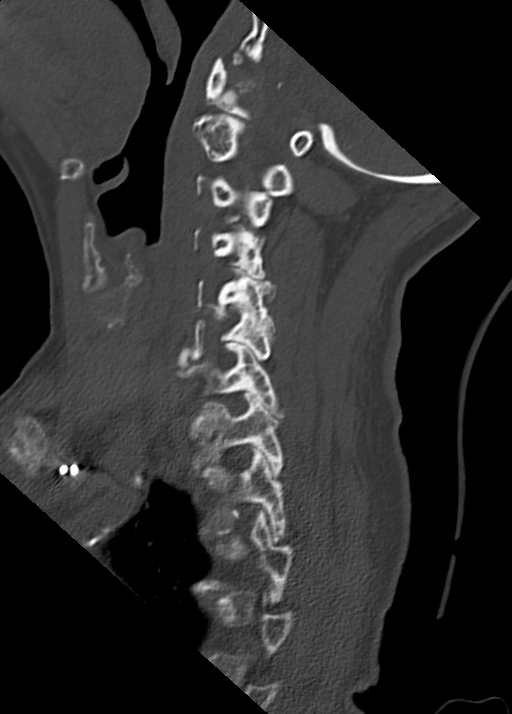
[im 51/76  bone]
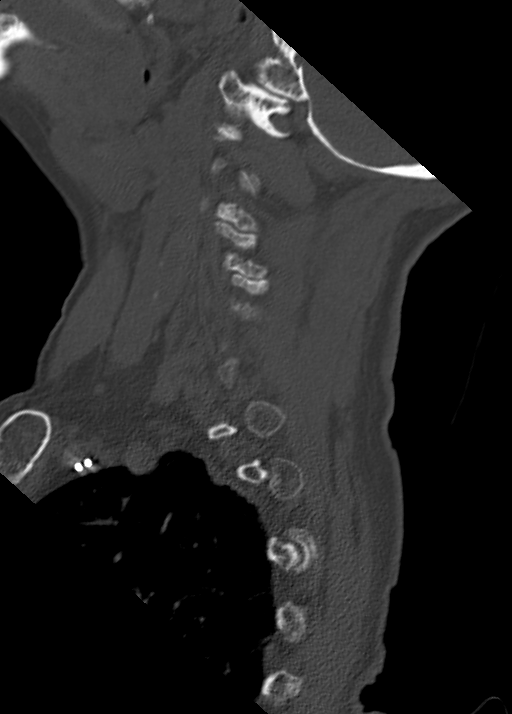

[13 of 27 positions shown; findings below may reference images not displayed]

FINDINGS: CT HEAD FINDINGS

No intracranial hemorrhage. No parenchymal contusion. No midline
shift or mass effect. Basilar cisterns are patent. No skull base
fracture. No fluid in the paranasal sinuses or mastoid air cells.
Orbits are normal.

There is generalized cortical atrophy. There is periventricular
white matter hypodensities.

CT CERVICAL SPINE FINDINGS

Straightening of normal cervical lordosis. No prevertebral soft
tissue swelling. Normal alignment of cervical vertebral bodies. No
loss of vertebral body height. Normal facet articulation. Normal
craniocervical junction.No evidence epidural or paraspinal hematoma.
There is endplate spurring from C5 through C7 with joint space
narrowing.
IMPRESSION: 1. No intracranial trauma.
2. Chronic atrophy and microvascular disease unchanged.
3. No cervical spine fracture.
4. Straightening of the normal cervical lordosis may be secondary to
position, muscle spasm, or ligamentous injury.

## 2017-02-09 ENCOUNTER — Encounter
Admission: RE | Admit: 2017-02-09 | Discharge: 2017-02-09 | Disposition: A | Discharge status: Home / Self Care | Payer: Medicare Other | Source: Ambulatory Visit | Attending: Internal Medicine | Admitting: Internal Medicine

## 2017-02-12 LAB — GLUCOSE, CAPILLARY: GLUCOSE-CAPILLARY: 109 mg/dL — AB (ref 65–99)

## 2017-02-26 LAB — GLUCOSE, CAPILLARY: Glucose-Capillary: 110 mg/dL — ABNORMAL HIGH (ref 65–99)

## 2017-02-28 ENCOUNTER — Non-Acute Institutional Stay (SKILLED_NURSING_FACILITY): Payer: Medicare Other | Admitting: Gerontology

## 2017-02-28 DIAGNOSIS — F0390 Unspecified dementia without behavioral disturbance: Secondary | ICD-10-CM

## 2017-02-28 DIAGNOSIS — G4700 Insomnia, unspecified: Secondary | ICD-10-CM

## 2017-02-28 DIAGNOSIS — I10 Essential (primary) hypertension: Secondary | ICD-10-CM

## 2017-02-28 DIAGNOSIS — K219 Gastro-esophageal reflux disease without esophagitis: Secondary | ICD-10-CM

## 2017-02-28 DIAGNOSIS — R1312 Dysphagia, oropharyngeal phase: Secondary | ICD-10-CM | POA: Diagnosis not present

## 2017-02-28 DIAGNOSIS — Z9181 History of falling: Secondary | ICD-10-CM | POA: Diagnosis not present

## 2017-02-28 DIAGNOSIS — M6281 Muscle weakness (generalized): Secondary | ICD-10-CM

## 2017-02-28 DIAGNOSIS — F39 Unspecified mood [affective] disorder: Secondary | ICD-10-CM

## 2017-02-28 DIAGNOSIS — I272 Pulmonary hypertension, unspecified: Secondary | ICD-10-CM

## 2017-02-28 DIAGNOSIS — J309 Allergic rhinitis, unspecified: Secondary | ICD-10-CM

## 2017-02-28 DIAGNOSIS — G629 Polyneuropathy, unspecified: Secondary | ICD-10-CM

## 2017-02-28 DIAGNOSIS — E119 Type 2 diabetes mellitus without complications: Secondary | ICD-10-CM

## 2017-02-28 DIAGNOSIS — Z950 Presence of cardiac pacemaker: Secondary | ICD-10-CM

## 2017-02-28 DIAGNOSIS — J449 Chronic obstructive pulmonary disease, unspecified: Secondary | ICD-10-CM

## 2017-02-28 DIAGNOSIS — F418 Other specified anxiety disorders: Secondary | ICD-10-CM

## 2017-02-28 DIAGNOSIS — I110 Hypertensive heart disease with heart failure: Secondary | ICD-10-CM

## 2017-02-28 DIAGNOSIS — I480 Paroxysmal atrial fibrillation: Secondary | ICD-10-CM

## 2017-02-28 DIAGNOSIS — N3281 Overactive bladder: Secondary | ICD-10-CM | POA: Diagnosis not present

## 2017-02-28 DIAGNOSIS — E43 Unspecified severe protein-calorie malnutrition: Secondary | ICD-10-CM

## 2017-02-28 DIAGNOSIS — M1990 Unspecified osteoarthritis, unspecified site: Secondary | ICD-10-CM

## 2017-02-28 DIAGNOSIS — E785 Hyperlipidemia, unspecified: Secondary | ICD-10-CM

## 2017-03-11 ENCOUNTER — Encounter
Admission: RE | Admit: 2017-03-11 | Discharge: 2017-03-11 | Disposition: A | Discharge status: Home / Self Care | Payer: Medicare Other | Source: Ambulatory Visit | Attending: Internal Medicine | Admitting: Internal Medicine

## 2017-03-13 LAB — GLUCOSE, CAPILLARY: GLUCOSE-CAPILLARY: 106 mg/dL — AB (ref 65–99)

## 2017-03-21 DIAGNOSIS — I69391 Dysphagia following cerebral infarction: Secondary | ICD-10-CM | POA: Insufficient documentation

## 2017-03-21 DIAGNOSIS — G4700 Insomnia, unspecified: Secondary | ICD-10-CM | POA: Insufficient documentation

## 2017-03-21 DIAGNOSIS — I110 Hypertensive heart disease with heart failure: Secondary | ICD-10-CM | POA: Insufficient documentation

## 2017-03-21 DIAGNOSIS — N3281 Overactive bladder: Secondary | ICD-10-CM | POA: Insufficient documentation

## 2017-03-21 DIAGNOSIS — I69320 Aphasia following cerebral infarction: Secondary | ICD-10-CM | POA: Insufficient documentation

## 2017-03-21 DIAGNOSIS — F39 Unspecified mood [affective] disorder: Secondary | ICD-10-CM | POA: Insufficient documentation

## 2017-03-21 DIAGNOSIS — I69398 Other sequelae of cerebral infarction: Secondary | ICD-10-CM | POA: Insufficient documentation

## 2017-03-21 DIAGNOSIS — M6281 Muscle weakness (generalized): Secondary | ICD-10-CM | POA: Insufficient documentation

## 2017-03-21 DIAGNOSIS — K219 Gastro-esophageal reflux disease without esophagitis: Secondary | ICD-10-CM | POA: Insufficient documentation

## 2017-03-21 DIAGNOSIS — Z9181 History of falling: Secondary | ICD-10-CM | POA: Insufficient documentation

## 2017-03-21 DIAGNOSIS — R1312 Dysphagia, oropharyngeal phase: Secondary | ICD-10-CM | POA: Insufficient documentation

## 2017-03-21 DIAGNOSIS — I509 Heart failure, unspecified: Secondary | ICD-10-CM | POA: Insufficient documentation

## 2017-03-21 NOTE — Progress Notes (Signed)
Location:      Place of Service:  SNF (31) Provider:  [name], NP-C  Patient, [name]  Patient Care Team: Patient, [name] as PCP - General (General Practice)  Extended Emergency Contact Information Primary Emergency Contact: [name] [name],[name]          [name] [name], [place] Phone: [phone] Relation: Daughter Secondary Emergency Contact: [name] [name] Address: [address]          [place], [place] [address] [place] of [place] Home Phone: [phone] Mobile Phone: [phone] Relation: Daughter  Code Status:  dnr  Goals of care: Advanced Directive information Advanced Directives 09/16/2016  Does Patient Have a Medical Advance Directive? Yes  Type of Advance Directive Out of facility DNR (pink MOST or yellow form)  Does patient want to make changes to medical advance directive? -  Copy of Healthcare Power of Attorney in Chart? -  Would patient like information on creating a medical advance directive? -  Pre-existing out of facility DNR order (yellow form or pink MOST form) -     Chief Complaint  Patient presents with  . Medical Management of Chronic Issues    HPI:  Pt is a 81 y.o. female seen today for medical management of chronic diseases. Pt has had a slow decline over the past year. She is on Hospice services. Pt is now non-verbal- makes attempts to speak, but cannot be understood. Speech garbled/mumbled. Requires assistance with feeding. Incontinent of bowel and bladder. Wears protective briefs. Occasionally attempts to get up unassisted. [place] in [facility] frequently for safety monitoring. Unable to obtain complete ROS d/t dementia. VSS   Past Medical History:  Diagnosis Date  . Anemia    "as a child" (08/05/2013)  . Anxiety   . Arthritis    "all over me; in all my joints" (08/05/2013)  . Atrial fibrillation (HCC)    a. Chronic; No longer on coumadin 2/2 frequent falls.  . Chest pain    a. 08/2011  Myoview: sm, fixed anteroseptal defect (attenuation), no ischemia, EF 62%.  . CHF (congestive heart failure) (HCC)   . Coccyx pain   . COPD (chronic obstructive pulmonary disease) (HCC)   . Dementia   . Depression   . Falls frequently    coumadin stopped  . GERD (gastroesophageal reflux disease)   . Gout    "right foot" (08/05/2013)  . H/O hiatal hernia   . History of pneumonia    "couple times; long time ago" (08/05/2013)  . Hypercholesterolemia   . Hypertension   . Hypertrophic cardiomyopathy (HCC)    a. 11/2013 Echo: EF 55-60%, basal inf HK, sev dil LA, no evidence of HCM.  PASP .  [name] Hyponatremia   . Hyposmolality   . Osteoarthritis   . Oxygen dependent    "3L 24/7" (08/05/2013)  . Pacemaker    a. 04/2012 MDT [id number] [employer] PPM, ser #: [id number] H.  . Pulmonary HTN    a. has had prior right heart cath in 2010; was felt that most likely due to elevated left sided pressures and would not benefit from vasodilator therapy;  b. 11/2013 Echo: PASP .  . Situational mixed anxiety and depressive disorder   . TIA (transient ischemic attack)   . Type II diabetes mellitus (HCC)   . Varicose veins    Past Surgical History:  Procedure Laterality Date  . ABDOMINAL HYSTERECTOMY     "partial" (08/05/2013)  . APPENDECTOMY    .  BACK SURGERY     "bone spur removed; mid back" (08/05/2013)  . CARDIAC CATHETERIZATION     "more than once" (08/05/2013)  . DILATION AND CURETTAGE OF UTERUS     "several" (08/05/2013)  . FOOT NEUROMA SURGERY Right   . INSERT / REPLACE / REMOVE PACEMAKER  1998; 2000's; 2014  . PACEMAKER GENERATOR CHANGE N/A 04/24/2012   Procedure: PACEMAKER GENERATOR CHANGE;  Surgeon: Duke Salvia, MD;  Location: [facility] CATH LAB;  Service: Cardiovascular;  Laterality: N/A;  . TONSILLECTOMY    . TOTAL KNEE ARTHROPLASTY Right 2011    Allergies  Allergen Reactions  . Aspirin Nausea And Vomiting  . Ativan [Lorazepam] Other (See Comments)    Hallucinations  . Calan [Verapamil  Hcl] Other (See Comments)    Patient states it makes her "out of her mind"; bp bottoms out (takes diltiazem at home)  . Codeine Nausea And Vomiting  . Crestor [Rosuvastatin Calcium] Other (See Comments)    Muscle pain  . Escitalopram Oxalate Other (See Comments)    Reaction Unknown   . Lactose Intolerance (Gi) Diarrhea    Can tolerate in small amounts  . Lexapro [Escitalopram] Other (See Comments)    Unknown   . Lipitor [Atorvastatin Calcium] Other (See Comments)    myalgia  . Lopressor [Metoprolol Tartrate] Other (See Comments)    Blood pressure bottoms out   . Metoprolol Tartrate Other (See Comments)    Unknown   . Nitroglycerin Other (See Comments)    Reaction unknown  . Norvasc [Amlodipine Besylate] Other (See Comments)    fatigue  . Nsaids Nausea Only  . Oxycodone Other (See Comments)    Reaction unknown  . Percocet [Oxycodone-Acetaminophen] Nausea And Vomiting  . Prednisone Itching and Swelling  . Sulfa Antibiotics Nausea Only and Other (See Comments)    Makes her stomach hurt  . Tape Other (See Comments)    Skin tears & bruises easily (SKIN IS THIN!!)  . Vimovo [Naproxen-Esomeprazole] Itching, Nausea Only and Swelling  . Zoloft [Sertraline] Other (See Comments)    UNKNOWN   . Penicillins Hives and Rash    Has patient had a PCN reaction causing immediate rash, facial/tongue/throat swelling, SOB or lightheadedness with hypotension: Yes Has patient had a PCN reaction causing severe rash involving mucus membranes or skin necrosis: No Has patient had a PCN reaction that required hospitalization No Has patient had a PCN reaction occurring within the last 10 years: No If all of the above answers are "NO", then may proceed with Cephalosporin use.     Allergies as of 02/28/2017      Reactions   Aspirin Nausea And Vomiting   Ativan [lorazepam] Other (See Comments)   Hallucinations   Calan [verapamil Hcl] Other (See Comments)   Patient states it makes her "out of her  mind"; bp bottoms out (takes diltiazem at home)   Codeine Nausea And Vomiting   Crestor [rosuvastatin Calcium] Other (See Comments)   Muscle pain   Escitalopram Oxalate Other (See Comments)   Reaction Unknown   Lactose Intolerance (gi) Diarrhea   Can tolerate in small amounts   Lexapro [escitalopram] Other (See Comments)   Unknown   Lipitor [atorvastatin Calcium] Other (See Comments)   myalgia   Lopressor [metoprolol Tartrate] Other (See Comments)   Blood pressure bottoms out   Metoprolol Tartrate Other (See Comments)   Unknown   Nitroglycerin Other (See Comments)   Reaction unknown   Norvasc [amlodipine Besylate] Other (See Comments)   fatigue  Nsaids Nausea Only   Oxycodone Other (See Comments)   Reaction unknown   Percocet [oxycodone-acetaminophen] Nausea And Vomiting   Prednisone Itching, Swelling   Sulfa Antibiotics Nausea Only, Other (See Comments)   Makes her stomach hurt   Tape Other (See Comments)   Skin tears & bruises easily (SKIN IS THIN!!)   Vimovo [naproxen-esomeprazole] Itching, Nausea Only, Swelling   Zoloft [sertraline] Other (See Comments)   UNKNOWN   Penicillins Hives, Rash   Has patient had a PCN reaction causing immediate rash, facial/tongue/throat swelling, SOB or lightheadedness with hypotension: Yes Has patient had a PCN reaction causing severe rash involving mucus membranes or skin necrosis: No Has patient had a PCN reaction that required hospitalization No Has patient had a PCN reaction occurring within the last 10 years: No If all of the above answers are "NO", then may proceed with Cephalosporin use.      Medication List       Accurate as of 02/28/17 11:59 PM. Always use your most recent med list.          acetaminophen 325 MG tablet Commonly known as:  TYLENOL Take 650 mg by mouth 4 (four) times daily.   ALPRAZolam 0.25 MG tablet Commonly known as:  XANAX Take 0.25 mg by mouth 2 (two) times daily.   CHLORASEPTIC 6-10 MG  lozenge Generic drug:  benzocaine-menthol Take 1 lozenge by mouth every 2 (two) hours as needed for sore throat.   clopidogrel 75 MG tablet Commonly known as:  PLAVIX Take 1 tablet (75 mg total) by mouth daily.   DIABETIC TUSSIN DM 10-100 MG/5ML liquid Generic drug:  dextromethorphan-guaiFENesin Take 10 mLs by mouth every 6 (six) hours as needed for cough.   divalproex 125 MG capsule Commonly known as:  DEPAKOTE SPRINKLE Take 250 mg by mouth 2 (two) times daily.   food thickener Powd Commonly known as:  THICK IT Take 1 g by mouth as needed (per SLP).   haloperidol 0.5 MG tablet Commonly known as:  HALDOL Take 0.5 mg by mouth every 8 (eight) hours as needed for agitation.   mirtazapine 15 MG tablet Commonly known as:  REMERON Take 15 mg by mouth at bedtime.   morphine 20 MG/ML concentrated solution Commonly known as:  ROXANOL Take 0.5 mLs (10 mg total) by mouth every 4 (four) hours as needed for severe pain.   OVER THE COUNTER MEDICATION "End It Ointment": Apply to irritated areas daily (as needed)   oxybutynin 15 MG 24 hr tablet Commonly known as:  DITROPAN XL Take 15 mg by mouth every morning.   senna 8.6 MG Tabs tablet Commonly known as:  SENOKOT Take 1 tablet by mouth 2 (two) times daily.   TAZTIA XT 180 MG 24 hr capsule Generic drug:  diltiazem Take 180 mg by mouth every morning.       Review of Systems  Unable to perform ROS: Dementia  Constitutional: Negative for activity change, appetite change and fever.  HENT: Negative for congestion, trouble swallowing and voice change.   Respiratory: Negative for apnea, cough, choking, shortness of breath and wheezing.   Cardiovascular: Negative for chest pain, palpitations and leg swelling.  Gastrointestinal: Negative for abdominal distention and abdominal pain.  Musculoskeletal: Positive for arthralgias (typical arthritis). Negative for back pain, gait problem and myalgias.  Skin: Positive for wound. Negative for  color change, pallor and rash.  Psychiatric/Behavioral: Positive for confusion. Negative for agitation and behavioral problems.  All other systems reviewed and are negative.  Immunization History  Administered Date(s) Administered  . Influenza Split 09/11/2012  . Influenza Whole 10/15/2010  . Influenza-Unspecified 08/10/2013  . PPD Test 08/24/2013  . Pneumococcal Polysaccharide-23 10/15/2010  . Pneumococcal-Unspecified 08/10/2013  . Tdap 10/01/2013   Pertinent  Health Maintenance Due  Topic Date Due  . FOOT EXAM  06/18/1946  . OPHTHALMOLOGY EXAM  06/18/1946  . URINE MICROALBUMIN  06/18/1946  . PNA vac Low Risk Adult (2 of 2 - PCV13) 08/10/2014  . HEMOGLOBIN A1C  03/17/2017  . INFLUENZA VACCINE  06/11/2017  . DEXA SCAN  Completed   Fall Risk  05/18/2013  Falls in the past year? Yes  Number falls in past yr: 1  Injury with Fall? No  Risk for fall due to : Impaired balance/gait;Impaired mobility   Functional Status Survey:    Vitals:   02/06/17 1230  BP: (!) 163/65  Pulse: 84  Resp: 20  Temp: 98.2 F (36.8 C)  SpO2: 95%  Weight: 110 lb 1.6 oz (49.9 kg)   Body mass index is 21.5 kg/m. Physical Exam  Constitutional: She is oriented to person, place, and time. Vital signs are normal. She appears well-developed and well-nourished. She is active and cooperative. She does not appear ill. No distress.  HENT:  Head: Normocephalic and atraumatic.  Mouth/Throat: Uvula is midline, oropharynx is clear and moist and mucous membranes are normal. Mucous membranes are not pale, not dry and not cyanotic.  Eyes: EOM and lids are normal.  Neck: Trachea normal, normal range of motion and full passive range of motion without pain. No JVD present. No tracheal deviation, no edema and no erythema present.  Cardiovascular: Regular rhythm, intact distal pulses and normal pulses.  Tachycardia present.  Exam reveals no gallop, no distant heart sounds and no friction rub.   No murmur  heard. Pulmonary/Chest: Effort normal. No accessory muscle usage. No respiratory distress. She has decreased breath sounds in the right lower field and the left lower field. She has no wheezes. She has no rhonchi. She has no rales. She exhibits no tenderness.  Abdominal: Soft. Normal appearance and bowel sounds are normal. She exhibits no distension and no ascites. There is no tenderness. There is no CVA tenderness.  Musculoskeletal: She exhibits no edema.  Expected osteoarthritis, stiffness  Neurological: She is alert and oriented to person, place, and time. She has normal strength.  Skin: Skin is warm, dry and intact. No rash noted. She is not diaphoretic. No cyanosis or erythema. No pallor. Nails show no clubbing.     Psychiatric: Her speech is normal. Her affect is labile. She is agitated. Thought content is delusional. Cognition and memory are impaired. She expresses impulsivity and inappropriate judgment. She exhibits abnormal recent memory and abnormal remote memory.  Nursing note and vitals reviewed.   Labs reviewed:  Recent Labs  04/28/16 0450 06/05/16 0726 09/16/16 2056 09/16/16 2103  NA 138 140 142 140  K 4.4 3.5 4.1 4.1  CL 105 102 107 105  CO2 24 30  --  25  GLUCOSE 85 125* 116* 126*  BUN 33* 26* 32* 26*  CREATININE 0.79 0.80 1.00 1.15*  CALCIUM 9.3 9.7  --  9.9    Recent Labs  04/28/16 0450 09/16/16 2103  AST 22 29  ALT 8* 14  ALKPHOS 57 84  BILITOT 0.5 1.0  PROT 6.7 8.0  ALBUMIN 3.9 4.4    Recent Labs  04/28/16 0450 06/05/16 0726 09/16/16 2056 09/16/16 2103  WBC 5.7 5.4  --  5.6  NEUTROABS 3.2 2.6  --  3.4  HGB 11.9* 13.7 14.6 15.0  HCT 35.5 39.9 43.0 44.2  MCV 99.6 98.6  --  98.0  PLT 163 201  --  194   Lab Results  Component Value Date   TSH 1.674 02/07/2015   Lab Results  Component Value Date   HGBA1C 5.6 09/17/2016   Lab Results  Component Value Date   CHOL 288 (H) 09/17/2016   HDL 43 09/17/2016   LDLCALC 207 (H) 09/17/2016    TRIG 190 (H) 09/17/2016   CHOLHDL 6.7 09/17/2016    Significant Diagnostic Results in last 30 days:  No results found.  Assessment/Plan 1. Dysphagia, oropharyngeal phase  Diet: mechanical soft  Nectar thick liquids  Medications crushed with puree  2. Muscle weakness (generalized)  Stable   3. Hypertensive heart disease with heart failure (HCC)  Stable   4. Gastroesophageal reflux disease without esophagitis  Stable   5. Hx of falling  Stable   6. Overactive bladder  Stable  Continue Oxybutynin 5 mg po BID  7. Insomnia, unspecified type  Stable  Continue Mirtazapine 7.5 mg PO Q HS   8. Mood disorder (HCC)  Stable  Continue Mirtazapine 7.5 mg PO Q HS   9. Other specified anxiety disorders  Stable  Continue Alprazolam 0.25 mg po BID  10. Essential hypertension  Stable   11. Paroxysmal atrial fibrillation (HCC)  Stable  Continue Diltiazem 60 mg po TID  Plavix 75 mg po Q Day  12. Allergic rhinitis, unspecified seasonality, unspecified trigger  Stable   13. Peripheral polyneuropathy  Stable   14. Chronic obstructive pulmonary disease, unspecified COPD type (HCC)  Stable   15. Pulmonary hypertension, moderate to severe (HCC)  Stable  OxyFast 20 mg/mL- 0.25-0.5 mL PO Q 2 hours prn- dyspnea, pain  Pepcid 40 mg po Q Day while on Oxycodone (h/o "allergy/intolerance")  16. Diabetes mellitus without complication (HCC)  Stable   17. Dementia without behavioral disturbance, unspecified dementia type  Stable   18. Osteoarthritis, unspecified osteoarthritis type, unspecified site  Stable  Continue Acetaminophen 650 mg po QID  19. Hyperlipidemia, unspecified hyperlipidemia type  Stable   20. Pacemaker  Stable  Followed by cardiology   21. Protein-calorie malnutrition, severe (HCC)  Stable  Continue Mirtazapine 7.5 mg PO Q HS     Family/ staff Communication:   Total Time:  Documentation:  Face to  Face:  Family/Phone:   Labs/tests ordered:    Medication list reviewed and assessed for continued appropriateness. Monthly medication orders reviewed and signed.  Brynda Rim, NP-C [facility] [phone] [address], [place] [address] Cell Phone (Mon-Fri 8am-5pm):  [phone] On Call:  [phone] & follow prompts after 5pm & weekends Office Phone:  [phone] Office Fax:  [phone]

## 2017-03-26 LAB — GLUCOSE, CAPILLARY: GLUCOSE-CAPILLARY: 115 mg/dL — AB (ref 65–99)

## 2017-04-02 LAB — GLUCOSE, CAPILLARY: GLUCOSE-CAPILLARY: 152 mg/dL — AB (ref 65–99)

## 2017-04-02 IMAGING — CR DG CHEST 1V PORT
1 series · 1 of 1 positions shown · non-contrast
Comparison: Prior radiograph from 03/10/2015.

CLINICAL DATA: Initial evaluation for acute syncope.

EXAM:
PORTABLE CHEST 1 VIEW

[portable]
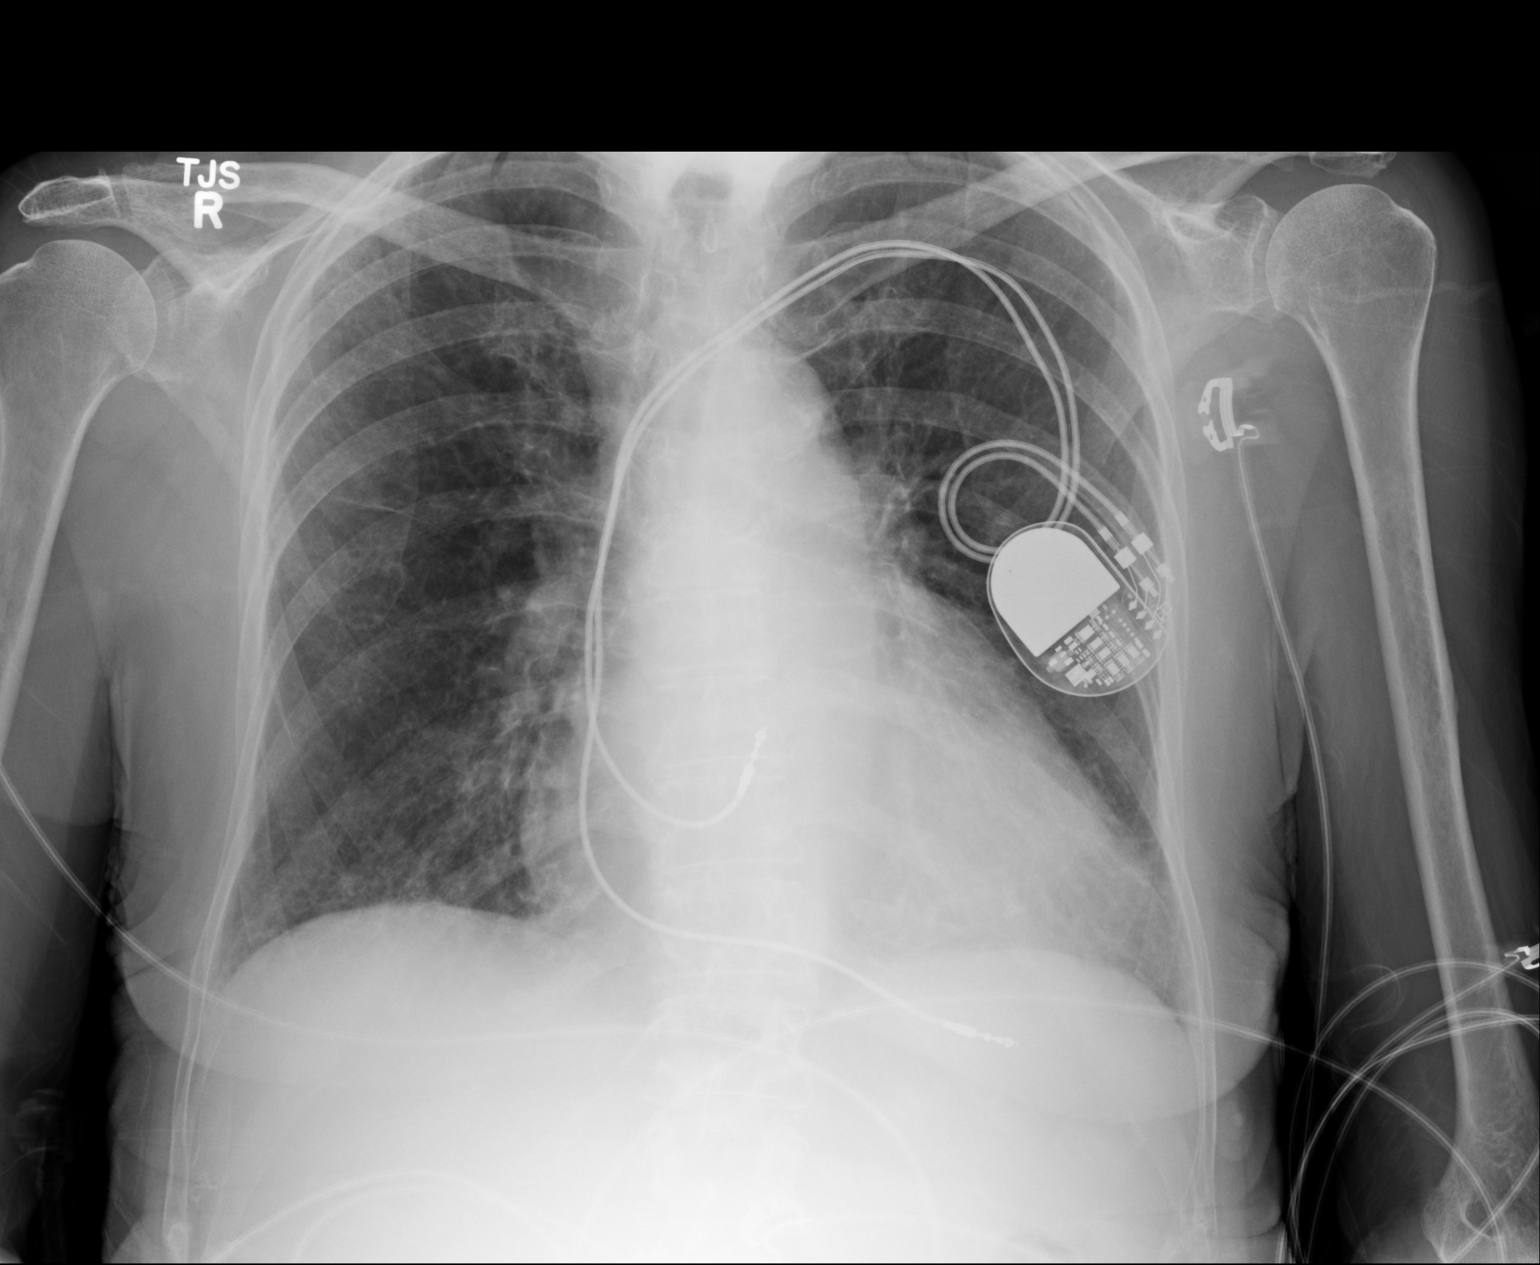

[1 of 1 positions shown; findings below may reference images not displayed]

FINDINGS: Left-sided dual lead transvenous pacemaker/ AICD again noted,
stable. Cardiomegaly is unchanged. Mediastinal silhouette within
normal limits.

Lungs are normally inflated. Minimal scarring/ atelectasis within
the right upper lobe. No focal infiltrates. No pulmonary edema or
pleural effusion. No pneumothorax.

No acute osseus abnormality.
IMPRESSION: Stable cardiomegaly. No other active cardiopulmonary disease
identified.

## 2017-04-02 IMAGING — CT CT CERVICAL SPINE W/O CM
2 of 5 series · 5 of 14 positions shown, 6 images · non-contrast
Comparison: Prior study from 08/12/2015.

CLINICAL DATA: Initial evaluation for acute trauma, fall.

EXAM:
CT HEAD WITHOUT CONTRAST
CT CERVICAL SPINE WITHOUT CONTRAST
TECHNIQUE: Multidetector CT imaging of the head and cervical spine was
performed following the standard protocol without intravenous
contrast. Multiplanar CT image reconstructions of the cervical spine
were also generated.

[Series 2: head bone · axial · 0.42mm/px · z∈[-156,-98]mm · 2 of 89 slices shown]
[im 30/89  bone]
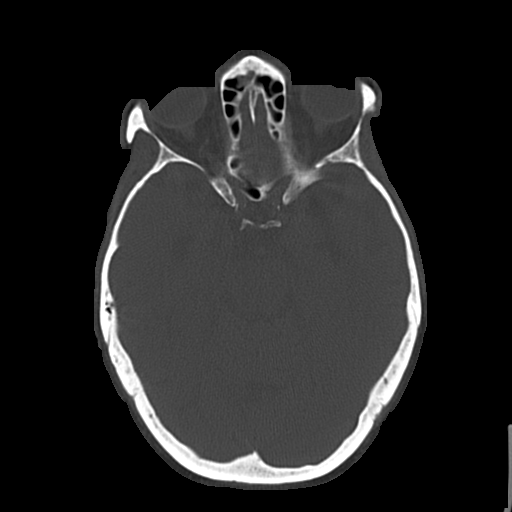
[im 59/89  bone]
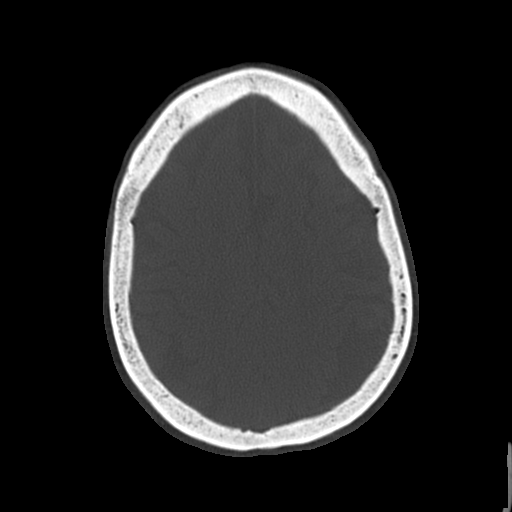

[Series 5: c spine soft · axial · 0.44mm/px · z∈[-308,-160]mm · 3 of 75 slices shown, 4 images]
[im 1/75  soft-tissue]
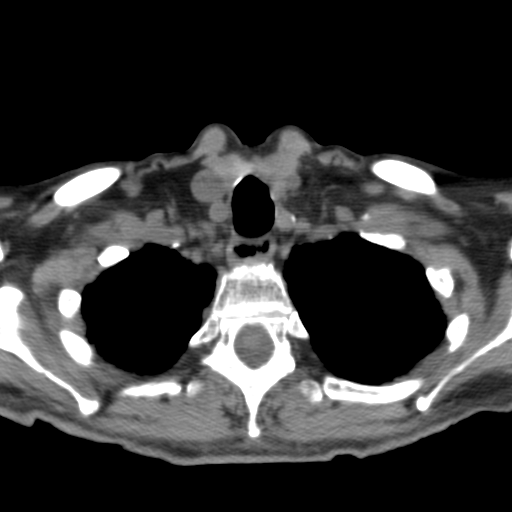
[im 1/75  bone]
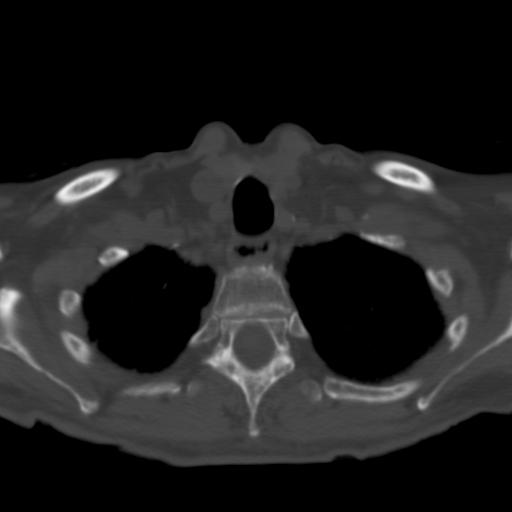
[im 38/75  bone]
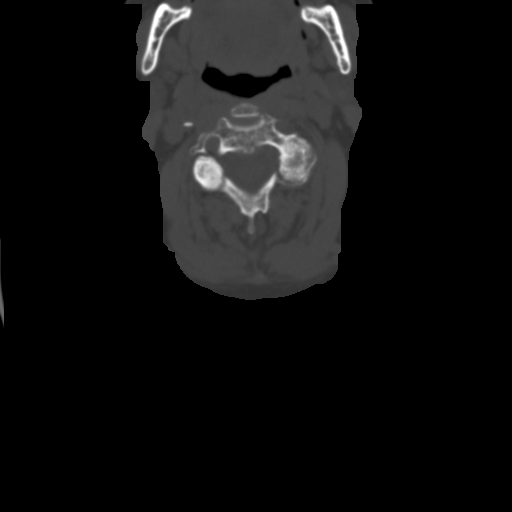
[im 75/75  bone]
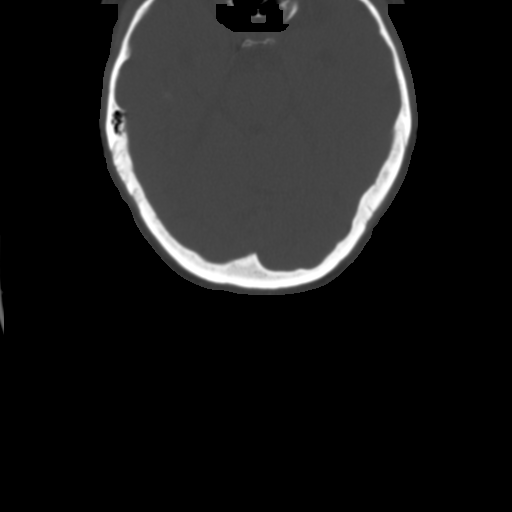

[5 of 14 positions shown; findings below may reference images not displayed]

FINDINGS: CT HEAD FINDINGS

Diffuse prominence of the CSF containing spaces consistent with
generalized cerebral atrophy. Prominent chronic microvascular
ischemic changes present within the periventricular and deep white
matter. Scattered vascular calcifications present within the carotid
siphons.

Small contusion/laceration at the central forehead. Scalp soft
tissues otherwise unremarkable.

No acute abnormality about the orbits.

No acute intracranial hemorrhage. No mass lesion, midline shift, or
mass effect. No hydrocephalus. No large vessel territory infarct. No
extra-axial fluid collection.

Calvarium intact.

Minimal opacity within the right frontal sinus. Paranasal sinuses
are otherwise clear. No significant mastoid effusion.

CT CERVICAL SPINE FINDINGS

The vertebral bodies are normally aligned with preservation of the
normal cervical lordosis. Vertebral body heights are preserved.
Normal C1-2 articulations are intact. No prevertebral soft tissue
swelling. No acute fracture or listhesis.

Moderate multilevel degenerative spondylolysis as evidenced by
intervertebral disc space narrowing, endplate sclerosis, and
osteophytosis, most prevalent at C5-6 and C6-7. Prominent
degenerative changes at the C1-2 articulation. Degenerative
Schmorl's node present at the superior endplate of T1. Multilevel
facet arthropathy present.

Visualized soft tissues of the neck demonstrate no acute
abnormality. Prominent vascular calcifications about the carotid
bifurcations. Visualized lung apices are clear without evidence of
apical pneumothorax.
IMPRESSION: CT BRAIN:

1. No acute intracranial process.
2. Small scalp contusion/laceration at the central forehead.
3. Generalized cerebral atrophy with chronic microvascular ischemic
disease.

CT CERVICAL SPINE:

No acute traumatic injury within the cervical spine.

## 2017-04-09 ENCOUNTER — Non-Acute Institutional Stay (SKILLED_NURSING_FACILITY): Payer: Medicare Other | Admitting: Gerontology

## 2017-04-09 DIAGNOSIS — I10 Essential (primary) hypertension: Secondary | ICD-10-CM | POA: Diagnosis not present

## 2017-04-09 DIAGNOSIS — M1990 Unspecified osteoarthritis, unspecified site: Secondary | ICD-10-CM

## 2017-04-09 DIAGNOSIS — Z9181 History of falling: Secondary | ICD-10-CM

## 2017-04-09 DIAGNOSIS — Z950 Presence of cardiac pacemaker: Secondary | ICD-10-CM

## 2017-04-09 DIAGNOSIS — K219 Gastro-esophageal reflux disease without esophagitis: Secondary | ICD-10-CM | POA: Diagnosis not present

## 2017-04-09 DIAGNOSIS — E785 Hyperlipidemia, unspecified: Secondary | ICD-10-CM

## 2017-04-09 DIAGNOSIS — F418 Other specified anxiety disorders: Secondary | ICD-10-CM | POA: Diagnosis not present

## 2017-04-09 DIAGNOSIS — I110 Hypertensive heart disease with heart failure: Secondary | ICD-10-CM | POA: Diagnosis not present

## 2017-04-09 DIAGNOSIS — G4700 Insomnia, unspecified: Secondary | ICD-10-CM

## 2017-04-09 DIAGNOSIS — R1312 Dysphagia, oropharyngeal phase: Secondary | ICD-10-CM

## 2017-04-09 DIAGNOSIS — E119 Type 2 diabetes mellitus without complications: Secondary | ICD-10-CM

## 2017-04-09 DIAGNOSIS — F39 Unspecified mood [affective] disorder: Secondary | ICD-10-CM | POA: Diagnosis not present

## 2017-04-09 DIAGNOSIS — J309 Allergic rhinitis, unspecified: Secondary | ICD-10-CM

## 2017-04-09 DIAGNOSIS — M6281 Muscle weakness (generalized): Secondary | ICD-10-CM

## 2017-04-09 DIAGNOSIS — I480 Paroxysmal atrial fibrillation: Secondary | ICD-10-CM

## 2017-04-09 DIAGNOSIS — I272 Pulmonary hypertension, unspecified: Secondary | ICD-10-CM

## 2017-04-09 DIAGNOSIS — N3281 Overactive bladder: Secondary | ICD-10-CM | POA: Diagnosis not present

## 2017-04-09 DIAGNOSIS — F0390 Unspecified dementia without behavioral disturbance: Secondary | ICD-10-CM

## 2017-04-09 DIAGNOSIS — G629 Polyneuropathy, unspecified: Secondary | ICD-10-CM

## 2017-04-09 DIAGNOSIS — E43 Unspecified severe protein-calorie malnutrition: Secondary | ICD-10-CM

## 2017-04-09 DIAGNOSIS — J449 Chronic obstructive pulmonary disease, unspecified: Secondary | ICD-10-CM

## 2017-04-09 LAB — GLUCOSE, CAPILLARY: GLUCOSE-CAPILLARY: 100 mg/dL — AB (ref 65–99)

## 2017-04-09 NOTE — Progress Notes (Signed)
Location:      Place of Service:  SNF (31) Provider:  [name], NP-C  Patient, [name]  Patient Care Team: Patient, [name] as PCP - General (General Practice)  Extended Emergency Contact Information Primary Emergency Contact: [name],[name]          Bruna Potter, [place] Phone: [phone] Relation: Daughter Secondary Emergency Contact: [name] Address: [address]          [place], [place] [address] [place] of [place] Home Phone: [phone] Mobile Phone: [phone] Relation: Daughter  Code Status:  dnr  Goals of care: Advanced Directive information Advanced Directives 09/16/2016  Does Patient Have a Medical Advance Directive? Yes  Type of Advance Directive Out of facility DNR (pink MOST or yellow form)  Does patient want to make changes to medical advance directive? -  Copy of Healthcare Power of Attorney in Chart? -  Would patient like information on creating a medical advance directive? -  Pre-existing out of facility DNR order (yellow form or pink MOST form) -     Chief Complaint  Patient presents with  . Medical Management of Chronic Issues    HPI:  Pt is a 81 y.o. female seen today for medical management of chronic diseases. Pt has had a slow decline over the past year. She is on Hospice services. Pt is now non-verbal- makes attempts to speak, but cannot be understood. Speech garbled/mumbled. Requires assistance with feeding. Incontinent of bowel and bladder. Wears protective briefs. Occasionally attempts to get up unassisted. [place] [facility] frequently for safety monitoring. Unable to obtain complete ROS d/t dementia. VSS   Past Medical History:  Diagnosis Date  . Anemia    "as a child" (08/05/2013)  . Anxiety   . Arthritis    "all over me; in all my joints" (08/05/2013)  . Atrial fibrillation (HCC)    a. Chronic; No longer on coumadin 2/2 frequent falls.  . Chest pain    a. 08/2011  Myoview: sm, fixed anteroseptal defect (attenuation), no ischemia, EF 62%.  . CHF (congestive heart failure) (HCC)   . Coccyx pain   . COPD (chronic obstructive pulmonary disease) (HCC)   . Dementia   . Depression   . Falls frequently    coumadin stopped  . GERD (gastroesophageal reflux disease)   . Gout    "right foot" (08/05/2013)  . H/O hiatal hernia   . History of pneumonia    "couple times; long time ago" (08/05/2013)  . Hypercholesterolemia   . Hypertension   . Hypertrophic cardiomyopathy (HCC)    a. 11/2013 Echo: EF 55-60%, basal inf HK, sev dil LA, no evidence of HCM.  PASP .  [name] Hyponatremia   . Hyposmolality   . Osteoarthritis   . Oxygen dependent    "3L 24/7" (08/05/2013)  . Pacemaker    a. 04/2012 MDT [id number] [employer] PPM, ser #: [id number] H.  . Pulmonary HTN    a. has had prior right heart cath in 2010; was felt that most likely due to elevated left sided pressures and would not benefit from vasodilator therapy;  b. 11/2013 Echo: PASP .  . Situational mixed anxiety and depressive disorder   . TIA (transient ischemic attack)   . Type II diabetes mellitus (HCC)   . Varicose veins    Past Surgical History:  Procedure Laterality Date  . ABDOMINAL HYSTERECTOMY     "partial" (08/05/2013)  . APPENDECTOMY    .  BACK SURGERY     "bone spur removed; mid back" (08/05/2013)  . CARDIAC CATHETERIZATION     "more than once" (08/05/2013)  . DILATION AND CURETTAGE OF UTERUS     "several" (08/05/2013)  . FOOT NEUROMA SURGERY Right   . INSERT / REPLACE / REMOVE PACEMAKER  1998; 2000's; 2014  . PACEMAKER GENERATOR CHANGE N/A 04/24/2012   Procedure: PACEMAKER GENERATOR CHANGE;  Surgeon: Duke Salvia, MD;  Location[facility] C[facility] LAB;  Service: Cardiovascular;  Laterality: N/A;  . TONSILLECTOMY    . TOTAL KNEE ARTHROPLASTY Right 2011    Allergies  Allergen Reactions  . Aspirin Nausea And Vomiting  . Ativan [Lorazepam] Other (See Comments)    Hallucinations  . Calan [Verapamil  Hcl] Other (See Comments)    Patient states it makes her "out of her mind"; bp bottoms out (takes diltiazem at home)  . Codeine Nausea And Vomiting  . Crestor [Rosuvastatin Calcium] Other (See Comments)    Muscle pain  . Escitalopram Oxalate Other (See Comments)    Reaction Unknown   . Lactose Intolerance (Gi) Diarrhea    Can tolerate in small amounts  . Lexapro [Escitalopram] Other (See Comments)    Unknown   . Lipitor [Atorvastatin Calcium] Other (See Comments)    myalgia  . Lopressor [Metoprolol Tartrate] Other (See Comments)    Blood pressure bottoms out   . Metoprolol Tartrate Other (See Comments)    Unknown   . Nitroglycerin Other (See Comments)    Reaction unknown  . Norvasc [Amlodipine Besylate] Other (See Comments)    fatigue  . Nsaids Nausea Only  . Oxycodone Other (See Comments)    Reaction unknown  . Percocet [Oxycodone-Acetaminophen] Nausea And Vomiting  . Prednisone Itching and Swelling  . Sulfa Antibiotics Nausea Only and Other (See Comments)    Makes her stomach hurt  . Tape Other (See Comments)    Skin tears & bruises easily (SKIN IS THIN!!)  . Vimovo [Naproxen-Esomeprazole] Itching, Nausea Only and Swelling  . Zoloft [Sertraline] Other (See Comments)    UNKNOWN   . Penicillins Hives and Rash    Has patient had a PCN reaction causing immediate rash, facial/tongue/throat swelling, SOB or lightheadedness with hypotension: Yes Has patient had a PCN reaction causing severe rash involving mucus membranes or skin necrosis: No Has patient had a PCN reaction that required hospitalization No Has patient had a PCN reaction occurring within the last 10 years: No If all of the above answers are "NO", then may proceed with Cephalosporin use.     Allergies as of 04/09/2017      Reactions   Aspirin Nausea And Vomiting   Ativan [lorazepam] Other (See Comments)   Hallucinations   Calan [verapamil Hcl] Other (See Comments)   Patient states it makes her "out of her  mind"; bp bottoms out (takes diltiazem at home)   Codeine Nausea And Vomiting   Crestor [rosuvastatin Calcium] Other (See Comments)   Muscle pain   Escitalopram Oxalate Other (See Comments)   Reaction Unknown   Lactose Intolerance (gi) Diarrhea   Can tolerate in small amounts   Lexapro [escitalopram] Other (See Comments)   Unknown   Lipitor [atorvastatin Calcium] Other (See Comments)   myalgia   Lopressor [metoprolol Tartrate] Other (See Comments)   Blood pressure bottoms out   Metoprolol Tartrate Other (See Comments)   Unknown   Nitroglycerin Other (See Comments)   Reaction unknown   Norvasc [amlodipine Besylate] Other (See Comments)   fatigue  Nsaids Nausea Only   Oxycodone Other (See Comments)   Reaction unknown   Percocet [oxycodone-acetaminophen] Nausea And Vomiting   Prednisone Itching, Swelling   Sulfa Antibiotics Nausea Only, Other (See Comments)   Makes her stomach hurt   Tape Other (See Comments)   Skin tears & bruises easily (SKIN IS THIN!!)   Vimovo [naproxen-esomeprazole] Itching, Nausea Only, Swelling   Zoloft [sertraline] Other (See Comments)   UNKNOWN   Penicillins Hives, Rash   Has patient had a PCN reaction causing immediate rash, facial/tongue/throat swelling, SOB or lightheadedness with hypotension: Yes Has patient had a PCN reaction causing severe rash involving mucus membranes or skin necrosis: No Has patient had a PCN reaction that required hospitalization No Has patient had a PCN reaction occurring within the last 10 years: No If all of the above answers are "NO", then may proceed with Cephalosporin use.      Medication List       Accurate as of 04/09/17 10:14 PM. Always use your most recent med list.          acetaminophen 325 MG tablet Commonly known as:  TYLENOL Take 650 mg by mouth 4 (four) times daily.   ALPRAZolam 0.25 MG tablet Commonly known as:  XANAX Take 0.25 mg by mouth 2 (two) times daily.   CHLORASEPTIC 6-10 MG  lozenge Generic drug:  benzocaine-menthol Take 1 lozenge by mouth every 2 (two) hours as needed for sore throat.   clopidogrel 75 MG tablet Commonly known as:  PLAVIX Take 1 tablet (75 mg total) by mouth daily.   DIABETIC TUSSIN DM 10-100 MG/5ML liquid Generic drug:  dextromethorphan-guaiFENesin Take 10 mLs by mouth every 6 (six) hours as needed for cough.   divalproex 125 MG capsule Commonly known as:  DEPAKOTE SPRINKLE Take 250 mg by mouth 2 (two) times daily.   food thickener Powd Commonly known as:  THICK IT Take 1 g by mouth as needed (per SLP).   haloperidol 0.5 MG tablet Commonly known as:  HALDOL Take 0.5 mg by mouth every 8 (eight) hours as needed for agitation.   mirtazapine 15 MG tablet Commonly known as:  REMERON Take 15 mg by mouth at bedtime.   morphine 20 MG/ML concentrated solution Commonly known as:  ROXANOL Take 0.5 mLs (10 mg total) by mouth every 4 (four) hours as needed for severe pain.   OVER THE COUNTER MEDICATION "End It Ointment": Apply to irritated areas daily (as needed)   oxybutynin 15 MG 24 hr tablet Commonly known as:  DITROPAN XL Take 15 mg by mouth every morning.   senna 8.6 MG Tabs tablet Commonly known as:  SENOKOT Take 1 tablet by mouth 2 (two) times daily.   TAZTIA XT 180 MG 24 hr capsule Generic drug:  diltiazem Take 180 mg by mouth every morning.       Review of Systems  Unable to perform ROS: Dementia  Constitutional: Negative for activity change, appetite change and fever.  HENT: Negative for congestion, trouble swallowing and voice change.   Respiratory: Negative for apnea, cough, choking, shortness of breath and wheezing.   Cardiovascular: Negative for chest pain, palpitations and leg swelling.  Gastrointestinal: Negative for abdominal distention and abdominal pain.  Musculoskeletal: Positive for arthralgias (typical arthritis). Negative for back pain, gait problem and myalgias.  Skin: Positive for wound. Negative for  color change, pallor and rash.  Psychiatric/Behavioral: Positive for confusion. Negative for agitation and behavioral problems.  All other systems reviewed and are negative.  Immunization History  Administered Date(s) Administered  . Influenza Split 09/11/2012  . Influenza Whole 10/15/2010  . Influenza-Unspecified 08/10/2013  . PPD Test 08/24/2013  . Pneumococcal Polysaccharide-23 10/15/2010  . Pneumococcal-Unspecified 08/10/2013  . Tdap 10/01/2013   Pertinent  Health Maintenance Due  Topic Date Due  . FOOT EXAM  06/18/1946  . OPHTHALMOLOGY EXAM  06/18/1946  . URINE MICROALBUMIN  06/18/1946  . PNA vac Low Risk Adult (2 of 2 - PCV13) 08/10/2014  . HEMOGLOBIN A1C  03/17/2017  . INFLUENZA VACCINE  06/11/2017  . DEXA SCAN  Completed   Fall Risk  05/18/2013  Falls in the past year? Yes  Number falls in past yr: 1  Injury with Fall? No  Risk for fall due to : Impaired balance/gait;Impaired mobility   Functional Status Survey:    Vitals:   04/03/17 0720  BP: (!) 154/58  Pulse: 74  Resp: 16  Temp: (!) 96.2 F (35.7 C)  SpO2: 100%   There is no height or weight on file to calculate BMI. Physical Exam  Constitutional: She is oriented to person, place, and time. Vital signs are normal. She appears well-developed and well-nourished. She is active and cooperative. She does not appear ill. No distress.  HENT:  Head: Normocephalic and atraumatic.  Mouth/Throat: Uvula is midline, oropharynx is clear and moist and mucous membranes are normal. Mucous membranes are not pale, not dry and not cyanotic.  Eyes: EOM and lids are normal.  Neck: Trachea normal, normal range of motion and full passive range of motion without pain. No JVD present. No tracheal deviation, no edema and no erythema present.  Cardiovascular: Normal rate, regular rhythm, intact distal pulses and normal pulses.  Exam reveals no gallop, no distant heart sounds and no friction rub.   No murmur heard. Pulses:       Dorsalis pedis pulses are 2+ on the right side, and 2+ on the left side.  Pulmonary/Chest: Effort normal. No accessory muscle usage. No respiratory distress. She has decreased breath sounds in the right lower field and the left lower field. She has no wheezes. She has no rhonchi. She has no rales. She exhibits no tenderness.  Abdominal: Soft. Normal appearance and bowel sounds are normal. She exhibits no distension and no ascites. There is no tenderness. There is no CVA tenderness.  Musculoskeletal: She exhibits no edema.  Expected osteoarthritis, stiffness  Neurological: She is alert and oriented to person, place, and time. She has normal strength.  Skin: Skin is warm, dry and intact. No rash noted. She is not diaphoretic. No cyanosis or erythema. No pallor. Nails show no clubbing.  Psychiatric: Her speech is normal. Her affect is labile. She is agitated. Thought content is delusional. Cognition and memory are impaired. She expresses impulsivity and inappropriate judgment. She exhibits abnormal recent memory and abnormal remote memory.  Nursing note and vitals reviewed.   Labs reviewed:  Recent Labs  04/28/16 0450 06/05/16 0726 09/16/16 2056 09/16/16 2103  NA 138 140 142 140  K 4.4 3.5 4.1 4.1  CL 105 102 107 105  CO2 24 30  --  25  GLUCOSE 85 125* 116* 126*  BUN 33* 26* 32* 26*  CREATININE 0.79 0.80 1.00 1.15*  CALCIUM 9.3 9.7  --  9.9    Recent Labs  04/28/16 0450 09/16/16 2103  AST 22 29  ALT 8* 14  ALKPHOS 57 84  BILITOT 0.5 1.0  PROT 6.7 8.0  ALBUMIN 3.9 4.4    Recent Labs  04/28/16 0450 06/05/16 0726 09/16/16 2056 09/16/16 2103  WBC 5.7 5.4  --  5.6  NEUTROABS 3.2 2.6  --  3.4  HGB 11.9* 13.7 14.6 15.0  HCT 35.5 39.9 43.0 44.2  MCV 99.6 98.6  --  98.0  PLT 163 201  --  194   Lab Results  Component Value Date   TSH 1.674 02/07/2015   Lab Results  Component Value Date   HGBA1C 5.6 09/17/2016   Lab Results  Component Value Date   CHOL 288 (H)  09/17/2016   HDL 43 09/17/2016   LDLCALC 207 (H) 09/17/2016   TRIG 190 (H) 09/17/2016   CHOLHDL 6.7 09/17/2016    Significant Diagnostic Results in last 30 days:  No results found.  Assessment/Plan 1. Dysphagia, oropharyngeal phase  Diet: mechanical soft  Nectar thick liquids  Medications crushed with puree  2. Muscle weakness (generalized)  Stable   3. Hypertensive heart disease with heart failure (HCC)  Stable   4. Gastroesophageal reflux disease without esophagitis  Stable   5. Hx of falling  Stable   6. Overactive bladder  Stable  Continue Oxybutynin 5 mg po BID  7. Insomnia, unspecified type  Stable  Continue Mirtazapine 7.5 mg PO Q HS   8. Mood disorder (HCC)  Stable  Continue Mirtazapine 7.5 mg PO Q HS   9. Other specified anxiety disorders  Stable  Continue Alprazolam 0.25 mg po BID  10. Essential hypertension  Stable   11. Paroxysmal atrial fibrillation (HCC)  Stable  Continue Cardizem 60 mg po TID  Plavix 75 mg po Q Day  12. Allergic rhinitis, unspecified seasonality, unspecified trigger  Stable   13. Peripheral polyneuropathy  Stable   14. Chronic obstructive pulmonary disease, unspecified COPD type (HCC)  Stable   15. Pulmonary hypertension, moderate to severe (HCC)  Stable  OxyFast 20 mg/mL- 0.25-0.5 mL PO Q 2 hours prn- dyspnea, pain  Pepcid 40 mg po Q Day while on Oxycodone (h/o "allergy/intolerance")  16. Diabetes mellitus without complication (HCC)  Stable   17. Dementia without behavioral disturbance, unspecified dementia type  Stable   18. Osteoarthritis, unspecified osteoarthritis type, unspecified site  Stable  Continue Acetaminophen 650 mg po QID  19. Hyperlipidemia, unspecified hyperlipidemia type  Stable   20. Pacemaker  Stable  Followed by cardiology   21. Protein-calorie malnutrition, severe (HCC)  Stable  Continue Mirtazapine 7.5 mg PO Q HS     Family/ staff  Communication:   Total Time:  Documentation:  Face to Face:  Family/Phone:   Labs/tests ordered:    Medication list reviewed and assessed for continued appropriateness. Monthly medication orders reviewed and signed.  Brynda Rim, NP-C Geriatrics [facility] [phone], [place] [address] Cell Phone (Mon-Fri 8am-5pm):  [phone] On Call:  [phone] & follow prompts after 5pm & weekends Office Phone:  [phone] Office Fax:  [phone]

## 2017-04-11 ENCOUNTER — Encounter
Admission: RE | Admit: 2017-04-11 | Discharge: 2017-04-11 | Disposition: A | Discharge status: Home / Self Care | Payer: Medicare Other | Source: Ambulatory Visit | Attending: Internal Medicine | Admitting: Internal Medicine

## 2017-04-18 DIAGNOSIS — F0280 Dementia in other diseases classified elsewhere without behavioral disturbance: Secondary | ICD-10-CM

## 2017-04-18 DIAGNOSIS — I482 Chronic atrial fibrillation, unspecified: Secondary | ICD-10-CM

## 2017-04-18 DIAGNOSIS — I69320 Aphasia following cerebral infarction: Secondary | ICD-10-CM

## 2017-04-18 DIAGNOSIS — J410 Simple chronic bronchitis: Secondary | ICD-10-CM

## 2017-04-18 DIAGNOSIS — E119 Type 2 diabetes mellitus without complications: Secondary | ICD-10-CM

## 2017-04-18 HISTORY — DX: Type 2 diabetes mellitus without complications: E11.9

## 2017-04-18 HISTORY — DX: Chronic atrial fibrillation, unspecified: I48.20

## 2017-04-18 HISTORY — DX: Aphasia following cerebral infarction: I69.320

## 2017-04-18 HISTORY — DX: Simple chronic bronchitis: J41.0

## 2017-04-18 HISTORY — DX: Dementia in other diseases classified elsewhere, unspecified severity, without behavioral disturbance, psychotic disturbance, mood disturbance, and anxiety: F02.80

## 2017-05-01 IMAGING — CT CT ABD-PELV W/ CM
2 of 5 series · 17 of 46 positions shown, 19 images · IV contrast (omnipaque)
Comparison: CT of the abdomen pelvis 03/09/2015

CLINICAL DATA: Lethargic patient.

EXAM:
CT ABDOMEN AND PELVIS WITH CONTRAST
TECHNIQUE: Multidetector CT imaging of the abdomen and pelvis was performed
using the standard protocol following bolus administration of
intravenous contrast.
CONTRAST:  80mL OMNIPAQUE IOHEXOL 300 MG/ML  SOLN

[Series 2: routine abd pel with · axial · 0.63mm/px · z∈[-448,-62]mm · 14 of 87 slices shown, 16 images]
[im 5/87  soft-tissue]
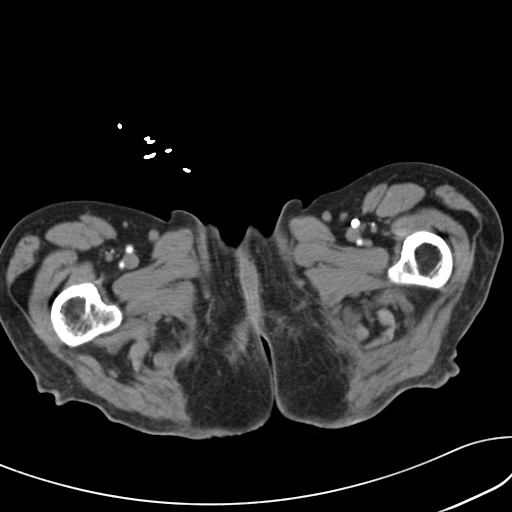
[im 5/87  bone]
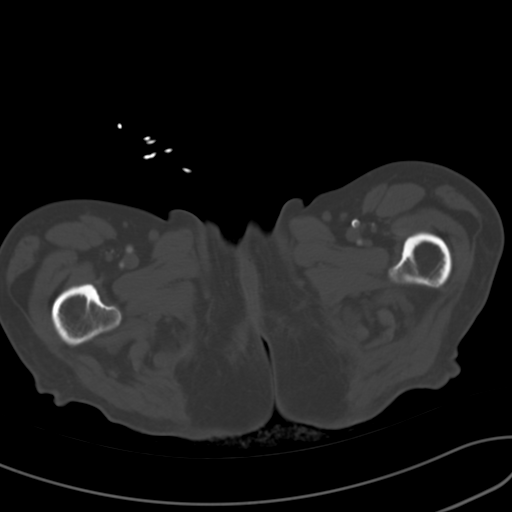
[im 10/87  soft-tissue]
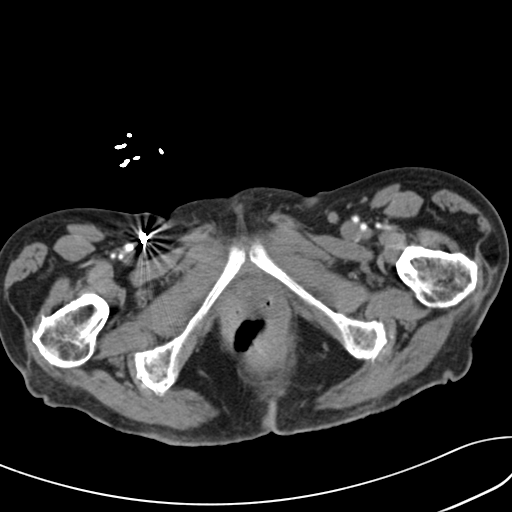
[im 20/87  soft-tissue]
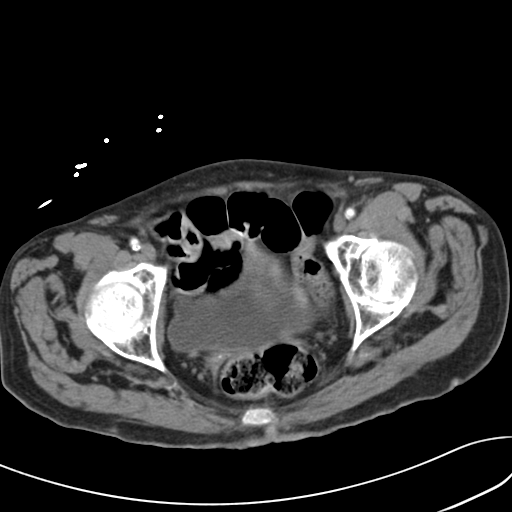
[im 24/87  soft-tissue]
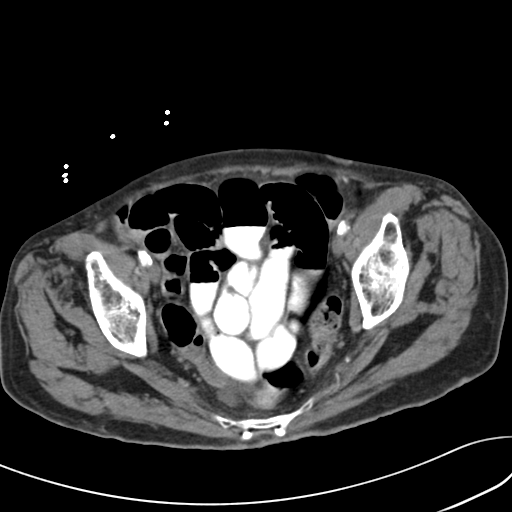
[im 29/87  soft-tissue]
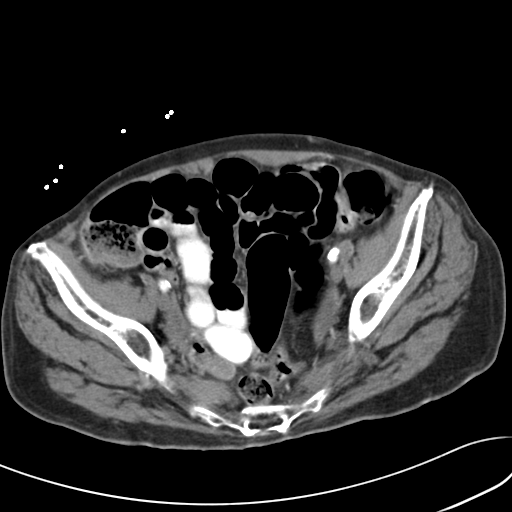
[im 34/87  soft-tissue]
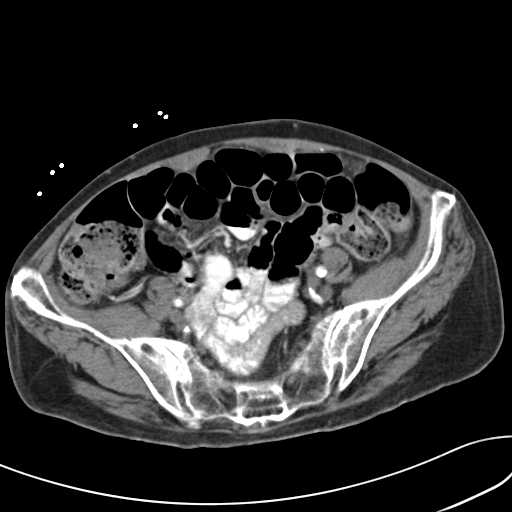
[im 39/87  soft-tissue]
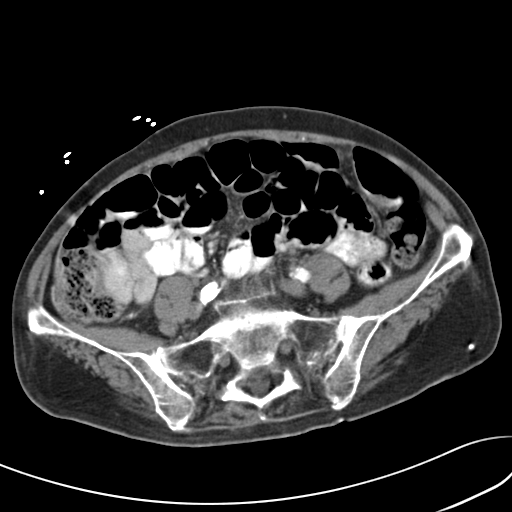
[im 48/87  soft-tissue]
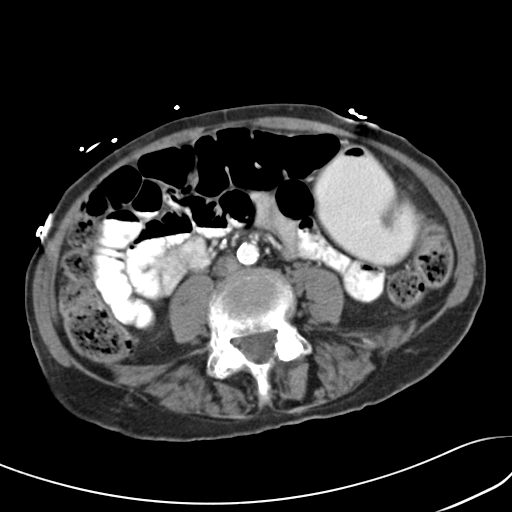
[im 53/87  soft-tissue]
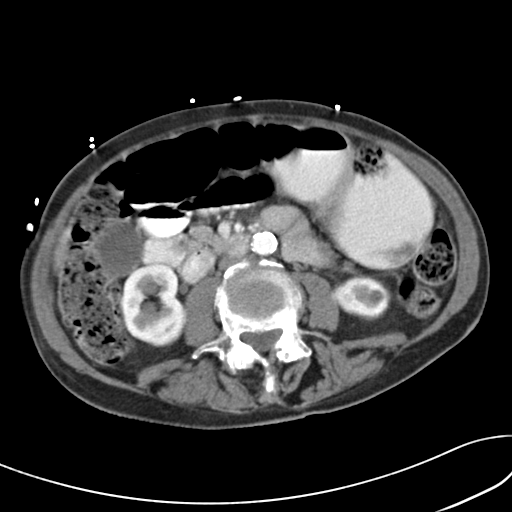
[im 53/87  bone]
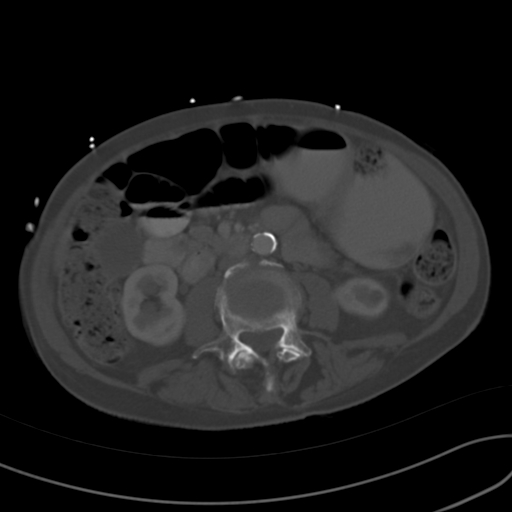
[im 58/87  soft-tissue]
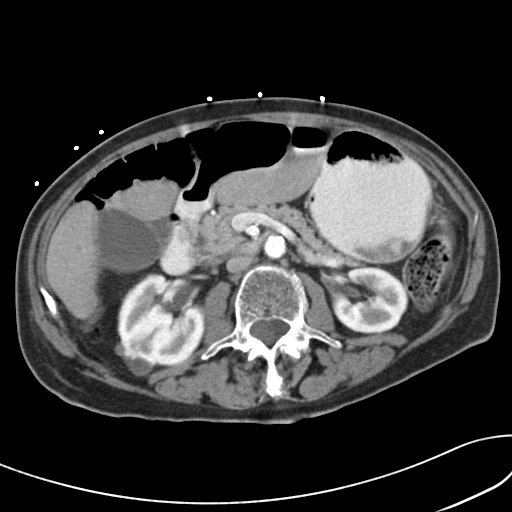
[im 63/87  soft-tissue]
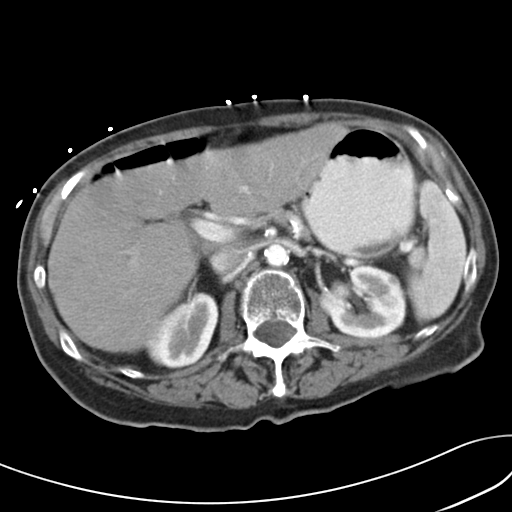
[im 67/87  soft-tissue]
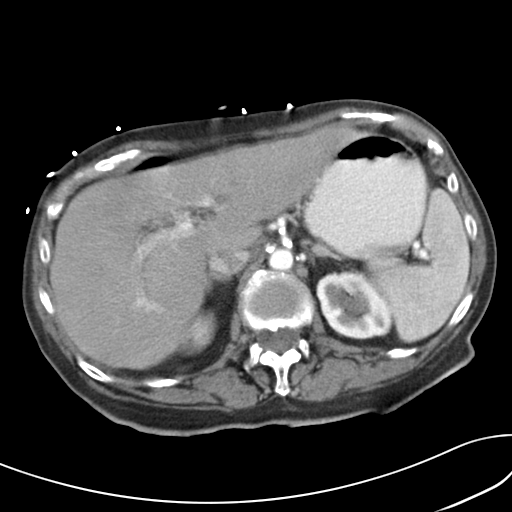
[im 77/87  soft-tissue]
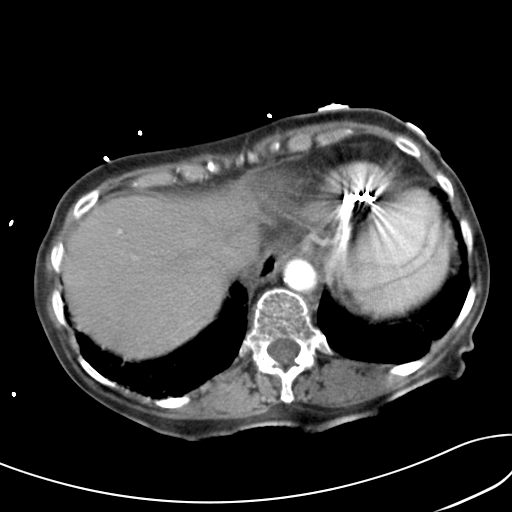
[im 82/87  soft-tissue]
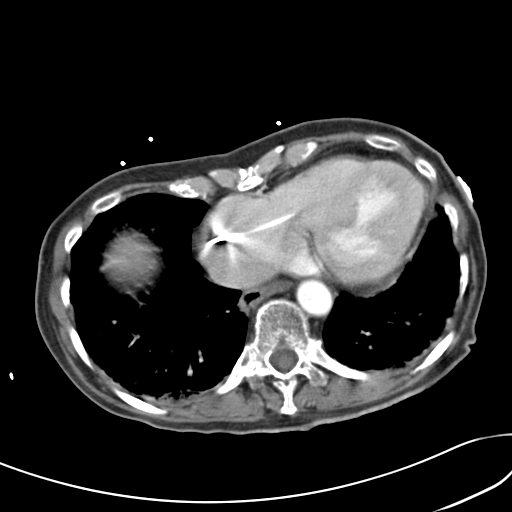

[Series 6: cor routine abd pel with · coronal · 0.89mm/px · 3 of 117 slices shown]
[im 39/117  soft-tissue]
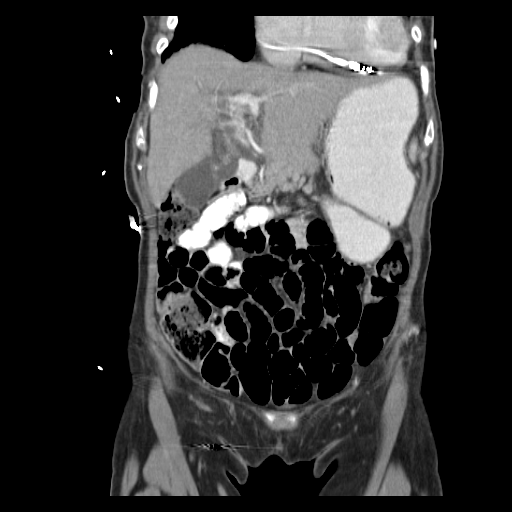
[im 52/117  soft-tissue]
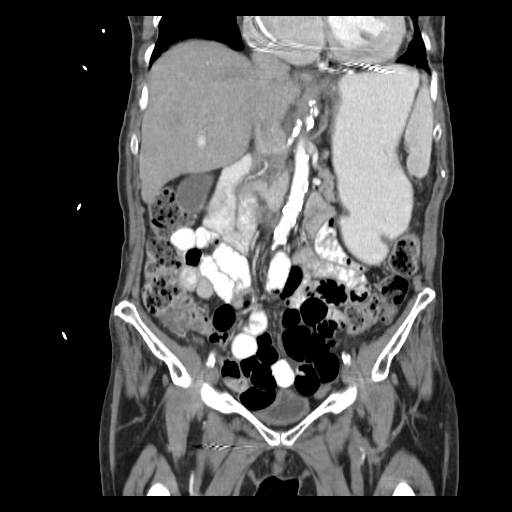
[im 65/117  soft-tissue]
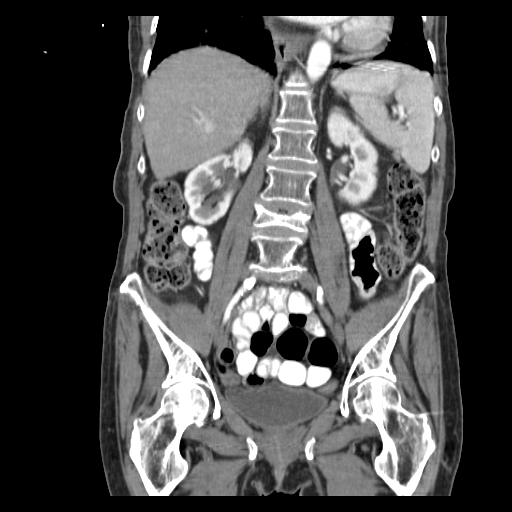

[17 of 46 positions shown; findings below may reference images not displayed]

FINDINGS: Lower chest: Chronic interstitial lung changes are seen. The heart
is enlarged. Pacemaker leads are seen.

Hepatobiliary: No masses or other significant abnormality.

Pancreas: No mass, inflammatory changes, or other significant
abnormality.

Spleen: Within normal limits in size and appearance.

Adrenals/Urinary Tract: No masses identified. No evidence of
hydronephrosis.

Stomach/Bowel: No evidence of obstruction, inflammatory process, or
abnormal fluid collections.

Vascular/Lymphatic: No pathologically enlarged lymph nodes. No
evidence of abdominal aortic aneurysm. Heavy atherosclerotic disease
of the aorta and its main branches is noted.

Reproductive: No mass or other significant abnormality. Status post
hysterectomy.

Other: None.

Musculoskeletal: No suspicious bone lesions identified. There is a
chronic compression fracture of L4 vertebral body, not significantly
changed from February 2015.
IMPRESSION: No acute findings within the abdomen or pelvis.

Chronic interstitial lung changes.

Chronic compression fracture of L4 vertebral body.

## 2017-05-11 ENCOUNTER — Encounter
Admission: RE | Admit: 2017-05-11 | Discharge: 2017-05-11 | Disposition: A | Discharge status: Home / Self Care | Payer: Medicare Other | Source: Ambulatory Visit | Attending: Internal Medicine | Admitting: Internal Medicine

## 2017-06-03 ENCOUNTER — Encounter: Payer: Self-pay | Admitting: Gerontology

## 2017-06-03 ENCOUNTER — Non-Acute Institutional Stay (SKILLED_NURSING_FACILITY): Payer: Medicare Other | Admitting: Gerontology

## 2017-06-03 DIAGNOSIS — Z950 Presence of cardiac pacemaker: Secondary | ICD-10-CM

## 2017-06-03 DIAGNOSIS — R1312 Dysphagia, oropharyngeal phase: Secondary | ICD-10-CM

## 2017-06-03 DIAGNOSIS — E785 Hyperlipidemia, unspecified: Secondary | ICD-10-CM

## 2017-06-03 DIAGNOSIS — M6281 Muscle weakness (generalized): Secondary | ICD-10-CM | POA: Diagnosis not present

## 2017-06-03 DIAGNOSIS — F0390 Unspecified dementia without behavioral disturbance: Secondary | ICD-10-CM

## 2017-06-03 DIAGNOSIS — F418 Other specified anxiety disorders: Secondary | ICD-10-CM | POA: Diagnosis not present

## 2017-06-03 DIAGNOSIS — I480 Paroxysmal atrial fibrillation: Secondary | ICD-10-CM | POA: Diagnosis not present

## 2017-06-03 DIAGNOSIS — E119 Type 2 diabetes mellitus without complications: Secondary | ICD-10-CM

## 2017-06-03 DIAGNOSIS — K219 Gastro-esophageal reflux disease without esophagitis: Secondary | ICD-10-CM | POA: Diagnosis not present

## 2017-06-03 DIAGNOSIS — J449 Chronic obstructive pulmonary disease, unspecified: Secondary | ICD-10-CM

## 2017-06-03 DIAGNOSIS — N3281 Overactive bladder: Secondary | ICD-10-CM

## 2017-06-03 DIAGNOSIS — I110 Hypertensive heart disease with heart failure: Secondary | ICD-10-CM

## 2017-06-03 DIAGNOSIS — J309 Allergic rhinitis, unspecified: Secondary | ICD-10-CM | POA: Diagnosis not present

## 2017-06-03 DIAGNOSIS — Z9181 History of falling: Secondary | ICD-10-CM

## 2017-06-03 DIAGNOSIS — G629 Polyneuropathy, unspecified: Secondary | ICD-10-CM

## 2017-06-03 DIAGNOSIS — I10 Essential (primary) hypertension: Secondary | ICD-10-CM | POA: Diagnosis not present

## 2017-06-03 DIAGNOSIS — F39 Unspecified mood [affective] disorder: Secondary | ICD-10-CM

## 2017-06-03 DIAGNOSIS — M1990 Unspecified osteoarthritis, unspecified site: Secondary | ICD-10-CM

## 2017-06-03 DIAGNOSIS — G4700 Insomnia, unspecified: Secondary | ICD-10-CM

## 2017-06-03 DIAGNOSIS — E43 Unspecified severe protein-calorie malnutrition: Secondary | ICD-10-CM

## 2017-06-03 DIAGNOSIS — I272 Pulmonary hypertension, unspecified: Secondary | ICD-10-CM

## 2017-06-03 NOTE — Progress Notes (Signed)
Location:   [facility] Room Number: 325A Place of Service:  SNF [phone]) Provider:  [name], NP-C  Patient, [name]  Patient Care Team: Patient, [name] as PCP - General (General Practice)  Extended Emergency Contact Information Primary Emergency Contact: [name],[name]          Bruna Potter, [place] Phone: [phone] Relation: Daughter Secondary Emergency Contact: [name] Address: [address]          [place], [place] [address] [place] Home Phone: [phone] Mobile Phone: [phone] Relation: Daughter  Code Status:  DNR Goals of care: Advanced Directive information Advanced Directives 06/03/2017  Does Patient Have a Medical Advance Directive? Yes  Type of Advance Directive Out of facility DNR (pink MOST or yellow form)  Does patient want to make changes to medical advance directive? No - Patient declined  Copy of Healthcare Power of Attorney in Chart? -  Would patient like information on creating a medical advance directive? -  Pre-existing out of facility DNR order (yellow form or pink MOST form) -     Chief Complaint  Patient presents with  . Medical Management of Chronic Issues    Routine Visit    HPI:  Pt is a 81 y.o. female seen today for medical management of chronic diseases. Pt has had a slow decline over the past year. She is on Hospice services. Pt is now non-verbal- makes attempts to speak, but cannot be understood. Speech garbled/mumbled. Requires assistance with feeding. Incontinent of bowel and bladder. Wears protective briefs. Occasionally attempts to get up unassisted. She has had one fall this past month without injury. [place] monitoring. Complained of sore gums this past month. Gave prn order for Orajel. Unable to obtain complete ROS d/t dementia. VSS   Past Medical History:  Diagnosis Date  . Anemia    "as a child"  (08/05/2013)  . Anxiety   . Aphasia as late effect of stroke 04/18/2017  . Arthritis    "all over me; in all my joints" (08/05/2013)  . Atrial fibrillation (HCC)    a. Chronic; No longer on coumadin 2/2 frequent falls.  . Chest pain    a. 08/2011 Myoview: sm, fixed anteroseptal defect (attenuation), no ischemia, EF 62%.  . CHF (congestive heart failure) (HCC)   . Chronic atrial fibrillation (HCC) 04/18/2017  . Coccyx pain   . COPD (chronic obstructive pulmonary disease) (HCC)   . Dementia   . Dementia in Alzheimer's disease 04/18/2017  . Depression   . Diabetes mellitus type 2, controlled, without complications (HCC) 04/18/2017  . Falls frequently    coumadin stopped  . GERD (gastroesophageal reflux disease)   . Gout    "right foot" (08/05/2013)  . H/O hiatal hernia   . History of pneumonia    "couple times; long time ago" (08/05/2013)  . Hypercholesterolemia   . Hypertension   . Hypertrophic cardiomyopathy (HCC)    a. 11/2013 Echo: EF 55-60%, basal inf HK, sev dil LA, no evidence of HCM.  PASP .  [name] Hyponatremia   . Hyposmolality   . Osteoarthritis   . Oxygen dependent    "3L 24/7" (08/05/2013)  . Pacemaker    a. 04/2012 MDT [id number] [employer] PPM, ser #: [id number] H.  . Pulmonary HTN (HCC)    a. has had prior right heart cath in 2010; was felt that most likely due to  elevated left sided pressures and would not benefit from vasodilator therapy;  b. 11/2013 Echo: PASP .  [name] Simple chronic bronchitis (HCC) 04/18/2017  . Situational mixed anxiety and depressive disorder   . TIA (transient ischemic attack)   . Type II diabetes mellitus (HCC)   . Varicose veins    Past Surgical History:  Procedure Laterality Date  . ABDOMINAL HYSTERECTOMY     "partial" (08/05/2013)  . APPENDECTOMY    . BACK SURGERY     "bone spur removed; mid back" (08/05/2013)  . CARDIAC CATHETERIZATION     "more than once" (08/05/2013)  . DILATION AND CURETTAGE OF UTERUS     "several" (08/05/2013)  .  FOOT NEUROMA SURGERY Right   . INSERT / REPLACE / REMOVE PACEMAKER  1998; 2000's; 2014  . PACEMAKER GENERATOR CHANGE N/A 04/24/2012   Procedure: PACEMAKER GENERATOR CHANGE;  Surgeon: Duke Salvia, MD;  Location: [facility] CATH LAB;  Service: Cardiovascular;  Laterality: N/A;  . TONSILLECTOMY    . TOTAL KNEE ARTHROPLASTY Right 2011    Allergies  Allergen Reactions  . Aspirin Nausea And Vomiting  . Ativan [Lorazepam] Other (See Comments)    Hallucinations  . Atorvastatin     Other reaction(s): Unknown  . Calan [Verapamil Hcl] Other (See Comments)    Patient states it makes her "out of her mind"; bp bottoms out (takes diltiazem at home)  . Codeine Nausea And Vomiting  . Crestor [Rosuvastatin Calcium] Other (See Comments)    Muscle pain  . Escitalopram Oxalate Other (See Comments)    Reaction Unknown   . Lactose Intolerance (Gi) Diarrhea    Can tolerate in small amounts  . Lexapro [Escitalopram] Other (See Comments)    Unknown   . Lipitor [Atorvastatin Calcium] Other (See Comments)    myalgia  . Lopressor [Metoprolol Tartrate] Other (See Comments)    Blood pressure bottoms out   . Metoprolol Tartrate Other (See Comments)    Unknown   . Nitroglycerin Other (See Comments)    Reaction unknown  . Norvasc [Amlodipine Besylate] Other (See Comments)    fatigue  . Nsaids Nausea Only  . Oxycodone Other (See Comments)    Reaction unknown  . Percocet [Oxycodone-Acetaminophen] Nausea And Vomiting  . Prednisolone     Other reaction(s): Unknown  . Prednisone Itching and Swelling  . Rosuvastatin     Other reaction(s): Unknown  . Sulfa Antibiotics Nausea Only and Other (See Comments)    Makes her stomach hurt  . Tape Other (See Comments)    Skin tears & bruises easily (SKIN IS THIN!!)  . Verapamil     Other reaction(s): Unknown  . Vimovo [Naproxen-Esomeprazole] Itching, Nausea Only and Swelling  . Zoloft [Sertraline] Other (See Comments)    UNKNOWN   . Penicillins Hives and Rash     Has patient had a PCN reaction causing immediate rash, facial/tongue/throat swelling, SOB or lightheadedness with hypotension: Yes Has patient had a PCN reaction causing severe rash involving mucus membranes or skin necrosis: No Has patient had a PCN reaction that required hospitalization No Has patient had a PCN reaction occurring within the last 10 years: No If all of the above answers are "NO", then may proceed with Cephalosporin use.     Allergies as of 06/03/2017      Reactions   Aspirin Nausea And Vomiting   Ativan [lorazepam] Other (See Comments)   Hallucinations   Atorvastatin    Other reaction(s): Unknown   Calan [verapamil Hcl] Other (See  Comments)   Patient states it makes her "out of her mind"; bp bottoms out (takes diltiazem at home)   Codeine Nausea And Vomiting   Crestor [rosuvastatin Calcium] Other (See Comments)   Muscle pain   Escitalopram Oxalate Other (See Comments)   Reaction Unknown   Lactose Intolerance (gi) Diarrhea   Can tolerate in small amounts   Lexapro [escitalopram] Other (See Comments)   Unknown   Lipitor [atorvastatin Calcium] Other (See Comments)   myalgia   Lopressor [metoprolol Tartrate] Other (See Comments)   Blood pressure bottoms out   Metoprolol Tartrate Other (See Comments)   Unknown   Nitroglycerin Other (See Comments)   Reaction unknown   Norvasc [amlodipine Besylate] Other (See Comments)   fatigue   Nsaids Nausea Only   Oxycodone Other (See Comments)   Reaction unknown   Percocet [oxycodone-acetaminophen] Nausea And Vomiting   Prednisolone    Other reaction(s): Unknown   Prednisone Itching, Swelling   Rosuvastatin    Other reaction(s): Unknown   Sulfa Antibiotics Nausea Only, Other (See Comments)   Makes her stomach hurt   Tape Other (See Comments)   Skin tears & bruises easily (SKIN IS THIN!!)   Verapamil    Other reaction(s): Unknown   Vimovo [naproxen-esomeprazole] Itching, Nausea Only, Swelling   Zoloft [sertraline]  Other (See Comments)   UNKNOWN   Penicillins Hives, Rash   Has patient had a PCN reaction causing immediate rash, facial/tongue/throat swelling, SOB or lightheadedness with hypotension: Yes Has patient had a PCN reaction causing severe rash involving mucus membranes or skin necrosis: No Has patient had a PCN reaction that required hospitalization No Has patient had a PCN reaction occurring within the last 10 years: No If all of the above answers are "NO", then may proceed with Cephalosporin use.      Medication List       Accurate as of 06/03/17  4:01 PM. Always use your most recent med list.          acetaminophen 325 MG tablet Commonly known as:  TYLENOL Take 650 mg by mouth 4 (four) times daily.   ALPRAZolam 0.25 MG tablet Commonly known as:  XANAX Take 0.25 mg by mouth 2 (two) times daily.   alum & mag hydroxide-simeth 400-400-40 MG/5ML suspension Commonly known as:  MAALOX PLUS Take 30 mLs by mouth every 4 (four) hours as needed.   benzocaine 10 % mucosal gel Commonly known as:  ORAJEL Apply thin film to area of soreness on gums every 2 hours as needed   clopidogrel 75 MG tablet Commonly known as:  PLAVIX Take 1 tablet (75 mg total) by mouth daily.   diltiazem 60 MG tablet Commonly known as:  CARDIZEM Take 60 mg by mouth 3 (three) times daily.   divalproex 125 MG capsule Commonly known as:  DEPAKOTE SPRINKLE Take 125 mg by mouth 3 (three) times daily with meals.   famotidine 40 MG tablet Commonly known as:  PEPCID Take 40 mg by mouth daily. For use while on oxycodone   mirtazapine 7.5 MG tablet Commonly known as:  REMERON Take 7.5 mg by mouth at bedtime.   NUTRITIONAL SUPPLEMENTS PO Offer Magic cup by mouth 2 time daily between meals   oxybutynin 5 MG tablet Commonly known as:  DITROPAN Take 5 mg by mouth 2 (two) times daily.   oxyCODONE 20 MG/ML concentrated solution Commonly known as:  ROXICODONE INTENSOL Give 0.25 ml - 0.5 ml by mouth every 2  hours as needed for  pain, dyspnea. Monitor for reaction. Per EPIC allergic reaction is GI upset.  0.25 - mild; 0.5 ml - moderate to severe   senna 8.6 MG Tabs tablet Commonly known as:  SENOKOT Take 1 tablet by mouth 2 (two) times daily.       Review of Systems  Unable to perform ROS: Dementia  Constitutional: Negative for activity change, appetite change and fever.  HENT: Negative for congestion, trouble swallowing and voice change.   Respiratory: Negative for apnea, cough, choking, shortness of breath and wheezing.   Cardiovascular: Negative for chest pain, palpitations and leg swelling.  Gastrointestinal: Negative for abdominal distention and abdominal pain.  Musculoskeletal: Positive for arthralgias (typical arthritis). Negative for back pain, gait problem and myalgias.  Skin: Negative for color change, pallor, rash and wound.  Psychiatric/Behavioral: Positive for confusion. Negative for agitation and behavioral problems.  All other systems reviewed and are negative.   Immunization History  Administered Date(s) Administered  . Influenza Split 09/11/2012  . Influenza Whole 10/15/2010  . Influenza-Unspecified 08/10/2013, 02/06/2016, 08/27/2016  . PPD Test 08/24/2013, 02/06/2016, 02/13/2017  . Pneumococcal Polysaccharide-23 10/15/2010  . Pneumococcal-Unspecified 08/10/2013, 02/06/2016  . Tdap 10/01/2013   Pertinent  Health Maintenance Due  Topic Date Due  . FOOT EXAM  06/18/1946  . OPHTHALMOLOGY EXAM  06/18/1946  . URINE MICROALBUMIN  06/18/1946  . PNA vac Low Risk Adult (2 of 2 - PCV13) 08/10/2014  . HEMOGLOBIN A1C  03/17/2017  . INFLUENZA VACCINE  06/11/2017  . DEXA SCAN  Completed   Fall Risk  05/18/2013  Falls in the past year? Yes  Number falls in past yr: 1  Injury with Fall? No  Risk for fall due to : Impaired balance/gait;Impaired mobility   Functional Status Survey:    Vitals:   06/03/17 1515  BP: 100/66  Pulse: 60  Resp: 18  Temp: 98.2 F (36.8 C)    SpO2: 100%  Weight: 104 lb 14.4 oz (47.6 kg)  Height: 5' (1.524 m)   Body mass index is 20.49 kg/m. Physical Exam  Constitutional: She is oriented to person, place, and time. Vital signs are normal. She appears well-developed and well-nourished. She is active and cooperative. She does not appear ill. No distress.  HENT:  Head: Normocephalic and atraumatic.  Mouth/Throat: Uvula is midline, oropharynx is clear and moist and mucous membranes are normal. Mucous membranes are not pale, not dry and not cyanotic.  Eyes: EOM and lids are normal.  Neck: Trachea normal, normal range of motion and full passive range of motion without pain. No JVD present. No tracheal deviation, no edema and no erythema present.  Cardiovascular: Normal rate, regular rhythm, intact distal pulses and normal pulses.  Exam reveals no gallop, no distant heart sounds and no friction rub.   No murmur heard. Pulses:      Dorsalis pedis pulses are 2+ on the right side, and 2+ on the left side.  Pulmonary/Chest: Effort normal. No accessory muscle usage. No respiratory distress. She has decreased breath sounds in the right lower field and the left lower field. She has no wheezes. She has no rhonchi. She has no rales. She exhibits no tenderness.  Abdominal: Soft. Normal appearance and bowel sounds are normal. She exhibits no distension and no ascites. There is no tenderness. There is no CVA tenderness.  Musculoskeletal: She exhibits no edema.  Expected osteoarthritis, stiffness  Neurological: She is alert and oriented to person, place, and time. She has normal strength.  Skin: Skin is warm, dry  and intact. No rash noted. She is not diaphoretic. No cyanosis or erythema. No pallor. Nails show no clubbing.  Psychiatric: Her speech is normal. Her affect is labile. She is agitated. Thought content is delusional. Cognition and memory are impaired. She expresses impulsivity and inappropriate judgment. She exhibits abnormal recent memory  and abnormal remote memory.  Nursing note and vitals reviewed.   Labs reviewed:  Recent Labs  06/05/16 0726 09/16/16 2056 09/16/16 2103  NA 140 142 140  K 3.5 4.1 4.1  CL 102 107 105  CO2 30  --  25  GLUCOSE 125* 116* 126*  BUN 26* 32* 26*  CREATININE 0.80 1.00 1.15*  CALCIUM 9.7  --  9.9    Recent Labs  09/16/16 2103  AST 29  ALT 14  ALKPHOS 84  BILITOT 1.0  PROT 8.0  ALBUMIN 4.4    Recent Labs  06/05/16 0726 09/16/16 2056 09/16/16 2103  WBC 5.4  --  5.6  NEUTROABS 2.6  --  3.4  HGB 13.7 14.6 15.0  HCT 39.9 43.0 44.2  MCV 98.6  --  98.0  PLT 201  --  194   Lab Results  Component Value Date   TSH 1.674 02/07/2015   Lab Results  Component Value Date   HGBA1C 5.6 09/17/2016   Lab Results  Component Value Date   CHOL 288 (H) 09/17/2016   HDL 43 09/17/2016   LDLCALC 207 (H) 09/17/2016   TRIG 190 (H) 09/17/2016   CHOLHDL 6.7 09/17/2016    Significant Diagnostic Results in last 30 days:  No results found.  Assessment/Plan 1. Dysphagia, oropharyngeal phase  Diet: mechanical soft  Nectar thick liquids  Medications crushed with puree  2. Muscle weakness (generalized)  Stable   3. Hypertensive heart disease with heart failure (HCC)  Stable   4. Gastroesophageal reflux disease without esophagitis  Stable   5. Hx of falling  Stable   6. Overactive bladder  Stable  Continue Oxybutynin 5 mg po BID  7. Insomnia, unspecified type  Stable  Continue Mirtazapine 7.5 mg PO Q HS   8. Mood disorder (HCC)  Stable  Continue Mirtazapine 7.5 mg PO Q HS   9. Other specified anxiety disorders  Stable  Continue Alprazolam 0.25 mg po BID  10. Essential hypertension  Stable   11. Paroxysmal atrial fibrillation (HCC)  Stable  Continue Cardizem 60 mg po TID  Plavix 75 mg po Q Day  12. Allergic rhinitis, unspecified seasonality, unspecified trigger  Stable   13. Peripheral polyneuropathy  Stable   14. Chronic  obstructive pulmonary disease, unspecified COPD type (HCC)  Stable   15. Pulmonary hypertension, moderate to severe (HCC)  Stable  OxyFast 20 mg/mL- 0.25-0.5 mL PO Q 2 hours prn- dyspnea, pain  Pepcid 40 mg po Q Day while on Oxycodone (h/o "allergy/intolerance")  16. Diabetes mellitus without complication (HCC)  Stable   17. Dementia without behavioral disturbance, unspecified dementia type  Stable   18. Osteoarthritis, unspecified osteoarthritis type, unspecified site  Stable  Continue Acetaminophen 650 mg po QID  19. Hyperlipidemia, unspecified hyperlipidemia type  Stable   20. Pacemaker  Stable  Followed by cardiology   21. Protein-calorie malnutrition, severe (HCC)  Stable  Continue Mirtazapine 7.5 mg PO Q HS     Family/ staff Communication:   Total Time:  Documentation:  Face to Face:  Family/Phone:   Labs/tests ordered:    Medication list reviewed and assessed for continued appropriateness. Monthly medication orders reviewed and signed.  Brynda RimShannon H. Lyah Millirons, NP-C Geriatrics [facility] [id number] [place], [place] [address] Cell Phone (Mon-Fri 8am-5pm):  [phone] On Call:  [phone] & follow prompts after 5pm & weekends Office Phone:  [phone] Office Fax:  [phone]

## 2017-06-11 ENCOUNTER — Encounter
Admission: RE | Admit: 2017-06-11 | Discharge: 2017-06-11 | Disposition: A | Discharge status: Home / Self Care | Payer: Medicare Other | Source: Ambulatory Visit | Attending: Internal Medicine | Admitting: Internal Medicine

## 2017-06-16 ENCOUNTER — Other Ambulatory Visit: Payer: Self-pay

## 2017-06-16 MED ORDER — ALPRAZOLAM 0.25 MG PO TABS: 0.2500 mg | ORAL_TABLET | ORAL | 1 refills | Status: DC | PRN

## 2017-06-16 MED ORDER — ALPRAZOLAM 0.25 MG PO TABS: 0.2500 mg | ORAL_TABLET | Freq: Two times a day (BID) | ORAL | 1 refills | Status: DC

## 2017-06-16 NOTE — Telephone Encounter (Signed)
Rx sent to [facility] phone : [phone] , fax : [phone]  

## 2017-07-10 ENCOUNTER — Non-Acute Institutional Stay (SKILLED_NURSING_FACILITY): Payer: Medicare Other | Admitting: Gerontology

## 2017-07-10 ENCOUNTER — Encounter: Payer: Self-pay | Admitting: Gerontology

## 2017-07-10 DIAGNOSIS — I480 Paroxysmal atrial fibrillation: Secondary | ICD-10-CM | POA: Diagnosis not present

## 2017-07-10 DIAGNOSIS — N3281 Overactive bladder: Secondary | ICD-10-CM

## 2017-07-10 DIAGNOSIS — I110 Hypertensive heart disease with heart failure: Secondary | ICD-10-CM | POA: Diagnosis not present

## 2017-07-10 DIAGNOSIS — E785 Hyperlipidemia, unspecified: Secondary | ICD-10-CM

## 2017-07-10 DIAGNOSIS — J449 Chronic obstructive pulmonary disease, unspecified: Secondary | ICD-10-CM

## 2017-07-10 DIAGNOSIS — E43 Unspecified severe protein-calorie malnutrition: Secondary | ICD-10-CM

## 2017-07-10 DIAGNOSIS — I10 Essential (primary) hypertension: Secondary | ICD-10-CM | POA: Diagnosis not present

## 2017-07-10 DIAGNOSIS — F0390 Unspecified dementia without behavioral disturbance: Secondary | ICD-10-CM

## 2017-07-10 DIAGNOSIS — F39 Unspecified mood [affective] disorder: Secondary | ICD-10-CM

## 2017-07-10 DIAGNOSIS — J309 Allergic rhinitis, unspecified: Secondary | ICD-10-CM | POA: Diagnosis not present

## 2017-07-10 DIAGNOSIS — G4700 Insomnia, unspecified: Secondary | ICD-10-CM

## 2017-07-10 DIAGNOSIS — I272 Pulmonary hypertension, unspecified: Secondary | ICD-10-CM

## 2017-07-10 DIAGNOSIS — G629 Polyneuropathy, unspecified: Secondary | ICD-10-CM

## 2017-07-10 DIAGNOSIS — M1990 Unspecified osteoarthritis, unspecified site: Secondary | ICD-10-CM

## 2017-07-10 DIAGNOSIS — Z9181 History of falling: Secondary | ICD-10-CM | POA: Diagnosis not present

## 2017-07-10 DIAGNOSIS — E119 Type 2 diabetes mellitus without complications: Secondary | ICD-10-CM

## 2017-07-10 DIAGNOSIS — R1312 Dysphagia, oropharyngeal phase: Secondary | ICD-10-CM

## 2017-07-10 DIAGNOSIS — M6281 Muscle weakness (generalized): Secondary | ICD-10-CM

## 2017-07-10 DIAGNOSIS — F418 Other specified anxiety disorders: Secondary | ICD-10-CM

## 2017-07-10 DIAGNOSIS — K219 Gastro-esophageal reflux disease without esophagitis: Secondary | ICD-10-CM | POA: Diagnosis not present

## 2017-07-10 DIAGNOSIS — Z950 Presence of cardiac pacemaker: Secondary | ICD-10-CM

## 2017-07-10 NOTE — Progress Notes (Signed)
Location:   [facility] Room Number: 325A Place of Service:  SNF [phone]) Provider:  [name], NP-C  Patient, [name]  Patient Care Team: Patient, [name] as PCP - General (General Practice)  Extended Emergency Contact Information Primary Emergency Contact: [name],[name]          Bruna Potter, [place] [employer] Phone: [phone] Relation: Daughter Secondary Emergency Contact: [name] Address: [address]          [place], [place] [address] [place] of [place] Home Phone: [phone] Mobile Phone: [phone] Relation: Daughter  Code Status:  DNR Goals of care: Advanced Directive information Advanced Directives 07/10/2017  Does Patient Have a Medical Advance Directive? Yes  Type of Advance Directive Out of facility DNR (pink MOST or yellow form)  Does patient want to make changes to medical advance directive? No - Patient declined  Copy of Healthcare Power of Attorney in Chart? -  Would patient like information on creating a medical advance directive? -  Pre-existing out of facility DNR order (yellow form or pink MOST form) -     Chief Complaint  Patient presents with  . Medical Management of Chronic Issues    Routine Visit    HPI:  Pt is a 81 y.o. female seen today for medical management of chronic diseases. Pt has had a slow decline over the past year. She is on Hospice services. Pt is now non-verbal- makes attempts to speak, but cannot be understood. Speech garbled/mumbled. Requires assistance with feeding. Incontinent of bowel and bladder. Wears protective briefs. Occasionally attempts to get up unassisted. She has not had any falls this past month. [place] for safety monitoring. Dressing covering irritated right third toe. Daily dressing changes. Unable to obtain complete ROS d/t dementia. VSS   Past Medical History:  Diagnosis Date  . Anemia    "as a child"  (08/05/2013)  . Anxiety   . Aphasia as late effect of stroke 04/18/2017  . Arthritis    "all over me; in all my joints" (08/05/2013)  . Atrial fibrillation (HCC)    a. Chronic; No longer on coumadin 2/2 frequent falls.  . Chest pain    a. 08/2011 Myoview: sm, fixed anteroseptal defect (attenuation), no ischemia, EF 62%.  . CHF (congestive heart failure) (HCC)   . Chronic atrial fibrillation (HCC) 04/18/2017  . Coccyx pain   . COPD (chronic obstructive pulmonary disease) (HCC)   . Dementia   . Dementia in Alzheimer's disease 04/18/2017  . Depression   . Diabetes mellitus type 2, controlled, without complications (HCC) 04/18/2017  . Falls frequently    coumadin stopped  . GERD (gastroesophageal reflux disease)   . Gout    "right foot" (08/05/2013)  . H/O hiatal hernia   . History of pneumonia    "couple times; long time ago" (08/05/2013)  . Hypercholesterolemia   . Hypertension   . Hypertrophic cardiomyopathy (HCC)    a. 11/2013 Echo: EF 55-60%, basal inf HK, sev dil LA, no evidence of HCM.  PASP .  [name] Hyponatremia   . Hyposmolality   . Osteoarthritis   . Oxygen dependent    "3L 24/7" (08/05/2013)  . Pacemaker    a. 04/2012 MDT [id number] [employer] PPM, ser #: [id number] H.  . Pulmonary HTN (HCC)    a. has had prior right heart cath in 2010; was felt that most likely due to elevated left sided pressures  and would not benefit from vasodilator therapy;  b. 11/2013 Echo: PASP 60mmHg.  [name]. Simple chronic bronchitis (HCC) 04/18/2017  . Situational mixed anxiety and depressive disorder   . TIA (transient ischemic attack)   . Type II diabetes mellitus (HCC)   . Varicose veins    Past Surgical History:  Procedure Laterality Date  . ABDOMINAL HYSTERECTOMY     "partial" (08/05/2013)  . APPENDECTOMY    . BACK SURGERY     "bone spur removed; mid back" (08/05/2013)  . CARDIAC CATHETERIZATION     "more than once" (08/05/2013)  . DILATION AND CURETTAGE OF UTERUS     "several" (08/05/2013)  .  FOOT NEUROMA SURGERY Right   . INSERT / REPLACE / REMOVE PACEMAKER  1998; 2000's; 2014  . PACEMAKER GENERATOR CHANGE N/A 04/24/2012   Procedure: PACEMAKER GENERATOR CHANGE;  Surgeon: Duke SalviaSteven C Klein, MD;  [facility];  Service: Cardiovascular;  Laterality: N/A;  . TONSILLECTOMY    . TOTAL KNEE ARTHROPLASTY Right 2011    Allergies  Allergen Reactions  . Aspirin Nausea And Vomiting  . Ativan [Lorazepam] Other (See Comments)    Hallucinations  . Atorvastatin     Other reaction(s): Unknown  . Calan [Verapamil Hcl] Other (See Comments)    Patient states it makes her "out of her mind"; bp bottoms out (takes diltiazem at home)  . Codeine Nausea And Vomiting  . Crestor [Rosuvastatin Calcium] Other (See Comments)    Muscle pain  . Escitalopram Oxalate Other (See Comments)    Reaction Unknown   . Lactose Intolerance (Gi) Diarrhea    Can tolerate in small amounts  . Lexapro [Escitalopram] Other (See Comments)    Unknown   . Lipitor [Atorvastatin Calcium] Other (See Comments)    myalgia  . Lopressor [Metoprolol Tartrate] Other (See Comments)    Blood pressure bottoms out   . Metoprolol Tartrate Other (See Comments)    Unknown   . Nitroglycerin Other (See Comments)    Reaction unknown  . Norvasc [Amlodipine Besylate] Other (See Comments)    fatigue  . Nsaids Nausea Only  . Oxycodone Other (See Comments)    Reaction unknown  . Percocet [Oxycodone-Acetaminophen] Nausea And Vomiting  . Prednisolone     Other reaction(s): Unknown  . Prednisone Itching and Swelling  . Rosuvastatin     Other reaction(s): Unknown  . Sulfa Antibiotics Nausea Only and Other (See Comments)    Makes her stomach hurt  . Tape Other (See Comments)    Skin tears & bruises easily (SKIN IS THIN!!)  . Verapamil     Other reaction(s): Unknown  . Vimovo [Naproxen-Esomeprazole] Itching, Nausea Only and Swelling  . Zoloft [Sertraline] Other (See Comments)    UNKNOWN   . Penicillins Hives and Rash     Has patient had a PCN reaction causing immediate rash, facial/tongue/throat swelling, SOB or lightheadedness with hypotension: Yes Has patient had a PCN reaction causing severe rash involving mucus membranes or skin necrosis: No Has patient had a PCN reaction that required hospitalization No Has patient had a PCN reaction occurring within the last 10 years: No If all of the above answers are "NO", then may proceed with Cephalosporin use.     Allergies as of 07/10/2017      Reactions   Aspirin Nausea And Vomiting   Ativan [lorazepam] Other (See Comments)   Hallucinations   Atorvastatin    Other reaction(s): Unknown   Calan [verapamil Hcl] Other (See Comments)   Patient  states it makes her "out of her mind"; bp bottoms out (takes diltiazem at home)   Codeine Nausea And Vomiting   Crestor [rosuvastatin Calcium] Other (See Comments)   Muscle pain   Escitalopram Oxalate Other (See Comments)   Reaction Unknown   Lactose Intolerance (gi) Diarrhea   Can tolerate in small amounts   Lexapro [escitalopram] Other (See Comments)   Unknown   Lipitor [atorvastatin Calcium] Other (See Comments)   myalgia   Lopressor [metoprolol Tartrate] Other (See Comments)   Blood pressure bottoms out   Metoprolol Tartrate Other (See Comments)   Unknown   Nitroglycerin Other (See Comments)   Reaction unknown   Norvasc [amlodipine Besylate] Other (See Comments)   fatigue   Nsaids Nausea Only   Oxycodone Other (See Comments)   Reaction unknown   Percocet [oxycodone-acetaminophen] Nausea And Vomiting   Prednisolone    Other reaction(s): Unknown   Prednisone Itching, Swelling   Rosuvastatin    Other reaction(s): Unknown   Sulfa Antibiotics Nausea Only, Other (See Comments)   Makes her stomach hurt   Tape Other (See Comments)   Skin tears & bruises easily (SKIN IS THIN!!)   Verapamil    Other reaction(s): Unknown   Vimovo [naproxen-esomeprazole] Itching, Nausea Only, Swelling   Zoloft [sertraline]  Other (See Comments)   UNKNOWN   Penicillins Hives, Rash   Has patient had a PCN reaction causing immediate rash, facial/tongue/throat swelling, SOB or lightheadedness with hypotension: Yes Has patient had a PCN reaction causing severe rash involving mucus membranes or skin necrosis: No Has patient had a PCN reaction that required hospitalization No Has patient had a PCN reaction occurring within the last 10 years: No If all of the above answers are "NO", then may proceed with Cephalosporin use.      Medication List       Accurate as of 07/10/17  2:52 PM. Always use your most recent med list.          acetaminophen 325 MG tablet Commonly known as:  TYLENOL Take 650 mg by mouth 4 (four) times daily.   ALPRAZolam 0.25 MG tablet Commonly known as:  XANAX Take 1 tablet (0.25 mg total) by mouth 2 (two) times daily. At 9 am and 9 pm   ALPRAZolam 0.25 MG tablet Commonly known as:  XANAX Take 1 tablet (0.25 mg total) by mouth every 4 (four) hours as needed for anxiety.   alum & mag hydroxide-simeth 400-400-40 MG/5ML suspension Commonly known as:  MAALOX PLUS Take 30 mLs by mouth every 4 (four) hours as needed.   benzocaine 10 % mucosal gel Commonly known as:  ORAJEL Apply thin film to area of soreness on gums every 2 hours as needed   bisacodyl 10 MG suppository Commonly known as:  DULCOLAX Place 10 mg rectally daily as needed.   clopidogrel 75 MG tablet Commonly known as:  PLAVIX Take 1 tablet (75 mg total) by mouth daily.   collagenase ointment Commonly known as:  SANTYL Apply 1 application topically daily. Cleanse right 3rd toe with ns, apply santyl nickel thick to area daily.   diltiazem 60 MG tablet Commonly known as:  CARDIZEM Take 60 mg by mouth 3 (three) times daily.   divalproex 125 MG capsule Commonly known as:  DEPAKOTE SPRINKLE Take 125 mg by mouth 3 (three) times daily with meals.   famotidine 40 MG tablet Commonly known as:  PEPCID Take 40 mg by mouth  daily. For use while on oxycodone   mirtazapine  7.5 MG tablet Commonly known as:  REMERON Take 7.5 mg by mouth at bedtime.   oxybutynin 5 MG tablet Commonly known as:  DITROPAN Take 5 mg by mouth 2 (two) times daily.   oxyCODONE 20 MG/ML concentrated solution Commonly known as:  ROXICODONE INTENSOL Give 0.25 ml - 0.5 ml by mouth every 2 hours as needed for pain, dyspnea. Monitor for reaction. Per EPIC allergic reaction is GI upset.  0.25 - mild; 0.5 ml - moderate to severe   senna 8.6 MG Tabs tablet Commonly known as:  SENOKOT Take 1 tablet by mouth 2 (two) times daily.       Review of Systems  Unable to perform ROS: Dementia  Constitutional: Negative for activity change, appetite change and fever.  HENT: Negative for congestion, trouble swallowing and voice change.   Respiratory: Negative for apnea, cough, choking, shortness of breath and wheezing.   Cardiovascular: Negative for chest pain, palpitations and leg swelling.  Gastrointestinal: Negative for abdominal distention and abdominal pain.  Musculoskeletal: Positive for arthralgias (typical arthritis). Negative for back pain, gait problem and myalgias.  Skin: Negative for color change, pallor, rash and wound.  Psychiatric/Behavioral: Positive for confusion. Negative for agitation and behavioral problems.  All other systems reviewed and are negative.   Immunization History  Administered Date(s) Administered  . Influenza Split 09/11/2012  . Influenza Whole 10/15/2010  . Influenza-Unspecified 08/10/2013, 02/06/2016, 08/27/2016  . PPD Test 08/24/2013, 02/06/2016, 02/13/2017  . Pneumococcal Polysaccharide-23 10/15/2010  . Pneumococcal-Unspecified 08/10/2013, 02/06/2016  . Tdap 10/01/2013   Pertinent  Health Maintenance Due  Topic Date Due  . FOOT EXAM  06/18/1946  . OPHTHALMOLOGY EXAM  06/18/1946  . URINE MICROALBUMIN  06/18/1946  . PNA vac Low Risk Adult (2 of 2 - PCV13) 02/05/2017  . HEMOGLOBIN A1C  03/17/2017  .  INFLUENZA VACCINE  06/11/2017  . DEXA SCAN  Completed   Fall Risk  05/18/2013  Falls in the past year? Yes  Number falls in past yr: 1  Injury with Fall? No  Risk for fall due to : Impaired balance/gait;Impaired mobility   Functional Status Survey:    Vitals:   07/10/17 1434  BP: 112/89  Pulse: 61  Resp: 20  Temp: 97.9 F (36.6 C)  SpO2: 99%  Weight: 103 lb 4.8 oz (46.9 kg)  Height: 5' (1.524 m)   Body mass index is 20.17 kg/m. Physical Exam  Constitutional: She is oriented to person, place, and time. Vital signs are normal. She appears well-developed and well-nourished. She is active and cooperative. She does not appear ill. No distress.  HENT:  Head: Normocephalic and atraumatic.  Mouth/Throat: Uvula is midline, oropharynx is clear and moist and mucous membranes are normal. Mucous membranes are not pale, not dry and not cyanotic.  Eyes: EOM and lids are normal.  Neck: Trachea normal, normal range of motion and full passive range of motion without pain. No JVD present. No tracheal deviation, no edema and no erythema present.  Cardiovascular: Normal rate, regular rhythm, intact distal pulses and normal pulses.  Exam reveals no gallop, no distant heart sounds and no friction rub.   No murmur heard. Pulses:      Dorsalis pedis pulses are 2+ on the right side, and 2+ on the left side.  Pulmonary/Chest: Effort normal. No accessory muscle usage. No respiratory distress. She has decreased breath sounds in the right lower field and the left lower field. She has no wheezes. She has no rhonchi. She has no rales. She  exhibits no tenderness.  Abdominal: Soft. Normal appearance and bowel sounds are normal. She exhibits no distension and no ascites. There is no tenderness. There is no CVA tenderness.  Musculoskeletal: She exhibits no edema.  Expected osteoarthritis, stiffness  Neurological: She is alert and oriented to person, place, and time. She has normal strength.  Skin: Skin is warm,  dry and intact. No rash noted. She is not diaphoretic. No cyanosis or erythema. No pallor. Nails show no clubbing.  Psychiatric: Her speech is normal. Her affect is labile. She is agitated. Thought content is delusional. Cognition and memory are impaired. She expresses impulsivity and inappropriate judgment. She exhibits abnormal recent memory and abnormal remote memory.  Nursing note and vitals reviewed.   Labs reviewed:  Recent Labs  09/16/16 2056 09/16/16 2103  NA 142 140  K 4.1 4.1  CL 107 105  CO2  --  25  GLUCOSE 116* 126*  BUN 32* 26*  CREATININE 1.00 1.15*  CALCIUM  --  9.9    Recent Labs  09/16/16 2103  AST 29  ALT 14  ALKPHOS 84  BILITOT 1.0  PROT 8.0  ALBUMIN 4.4    Recent Labs  09/16/16 2056 09/16/16 2103  WBC  --  5.6  NEUTROABS  --  3.4  HGB 14.6 15.0  HCT 43.0 44.2  MCV  --  98.0  PLT  --  194   Lab Results  Component Value Date   TSH 1.674 02/07/2015   Lab Results  Component Value Date   HGBA1C 5.6 09/17/2016   Lab Results  Component Value Date   CHOL 288 (H) 09/17/2016   HDL 43 09/17/2016   LDLCALC 207 (H) 09/17/2016   TRIG 190 (H) 09/17/2016   CHOLHDL 6.7 09/17/2016    Significant Diagnostic Results in last 30 days:  No results found.  Assessment/Plan 1. Dysphagia, oropharyngeal phase  Diet: mechanical soft  Nectar thick liquids  Medications crushed with puree  2. Muscle weakness (generalized)  Stable   3. Hypertensive heart disease with heart failure (HCC)  Stable   4. Gastroesophageal reflux disease without esophagitis  Stable   5. Hx of falling  Stable   6. Overactive bladder  Stable  Continue Oxybutynin 5 mg po BID  7. Insomnia, unspecified type  Stable  Continue Mirtazapine 7.5 mg PO Q HS   8. Mood disorder (HCC)  Stable  Continue Mirtazapine 7.5 mg PO Q HS   9. Other specified anxiety disorders  Stable  Continue Alprazolam 0.25 mg po BID  10. Essential hypertension  Stable   11.  Paroxysmal atrial fibrillation (HCC)  Stable  Continue Cardizem 60 mg po TID  Plavix 75 mg po Q Day  12. Allergic rhinitis, unspecified seasonality, unspecified trigger  Stable   13. Peripheral polyneuropathy  Stable   14. Chronic obstructive pulmonary disease, unspecified COPD type (HCC)  Stable   15. Pulmonary hypertension, moderate to severe (HCC)  Stable  OxyFast 20 mg/mL- 0.25-0.5 mL PO Q 2 hours prn- dyspnea, pain  Pepcid 40 mg po Q Day while on Oxycodone (h/o "allergy/intolerance")  16. Diabetes mellitus without complication (HCC)  Stable   17. Dementia without behavioral disturbance, unspecified dementia type  Stable   18. Osteoarthritis, unspecified osteoarthritis type, unspecified site  Stable  Continue Acetaminophen 650 mg po QID  19. Hyperlipidemia, unspecified hyperlipidemia type  Stable   20. Pacemaker  Stable  Followed by cardiology   21. Protein-calorie malnutrition, severe (HCC)  Stable  Continue Mirtazapine 7.5 mg  PO Q HS    Family/ staff Communication:   Total Time:  Documentation:  Face to Face:  Family/Phone:   Labs/tests ordered:    Medication list reviewed and assessed for continued appropriateness. Monthly medication orders reviewed and signed.  Brynda Rim, NP-C Geriatrics [facility] [phone], [place] [address] Cell Phone (Mon-Fri 8am-5pm):  [phone] On Call:  [phone] & follow prompts after 5pm & weekends Office Phone:  [phone] Office Fax:  [phone]

## 2017-07-12 ENCOUNTER — Encounter
Admission: RE | Admit: 2017-07-12 | Discharge: 2017-07-12 | Disposition: A | Discharge status: Home / Self Care | Source: Ambulatory Visit | Attending: Internal Medicine | Admitting: Internal Medicine

## 2017-07-18 ENCOUNTER — Non-Acute Institutional Stay (SKILLED_NURSING_FACILITY): Payer: Medicare Other | Admitting: Gerontology

## 2017-07-18 DIAGNOSIS — L089 Local infection of the skin and subcutaneous tissue, unspecified: Secondary | ICD-10-CM | POA: Diagnosis not present

## 2017-07-31 IMAGING — CT CT HEAD W/O CM
2 series · 15 of 30 positions shown, 19 images · non-contrast
Comparison: 10/04/2015

CLINICAL DATA: Code stroke.  Altered mental status.

EXAM:
CT HEAD WITHOUT CONTRAST
TECHNIQUE: Contiguous axial images were obtained from the base of the skull
through the vertex without intravenous contrast.

[Series 201: head w/o, idose (1) · axial · non-contrast · 0.49mm/px · z∈[+215,+345]mm · 13 of 32 slices shown, 17 images]
[im 3/32  brain]
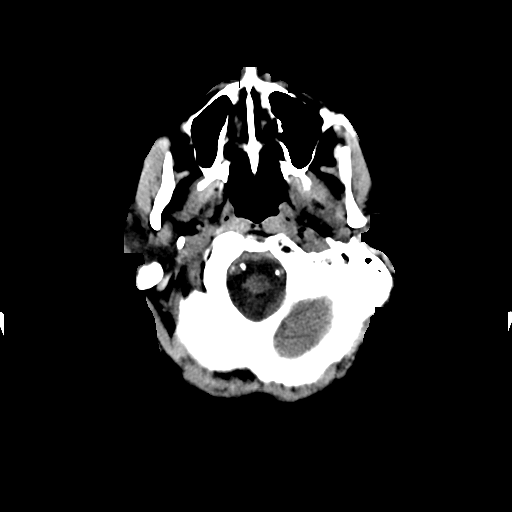
[im 3/32  bone]
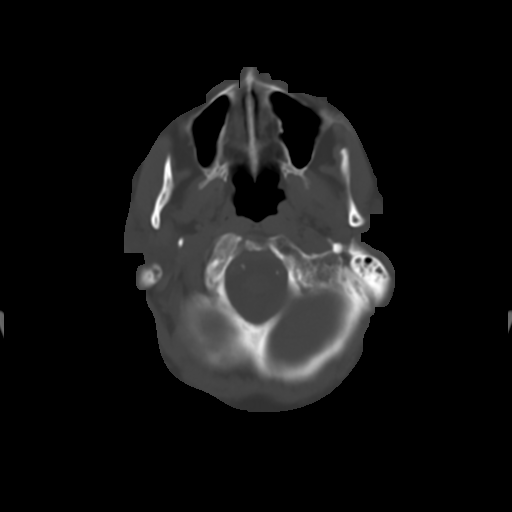
[im 5/32  brain]
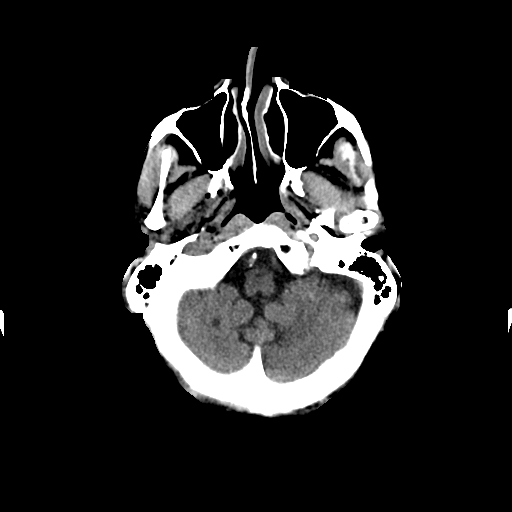
[im 7/32  brain]
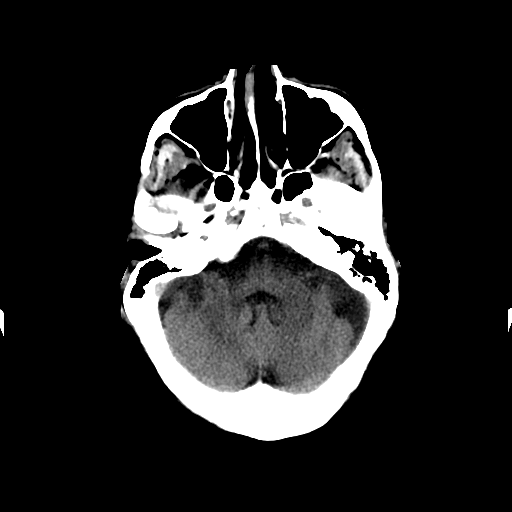
[im 9/32  brain]
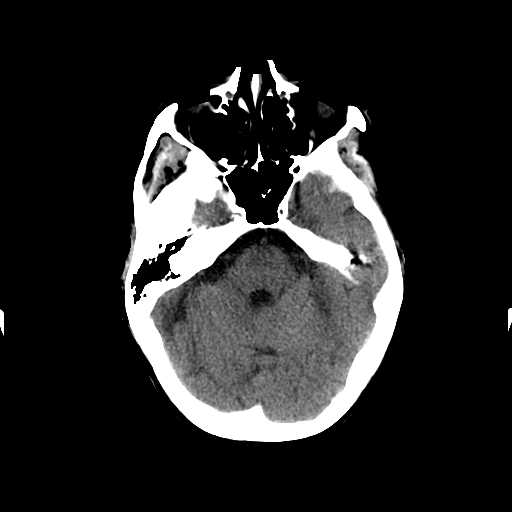
[im 12/32  brain]
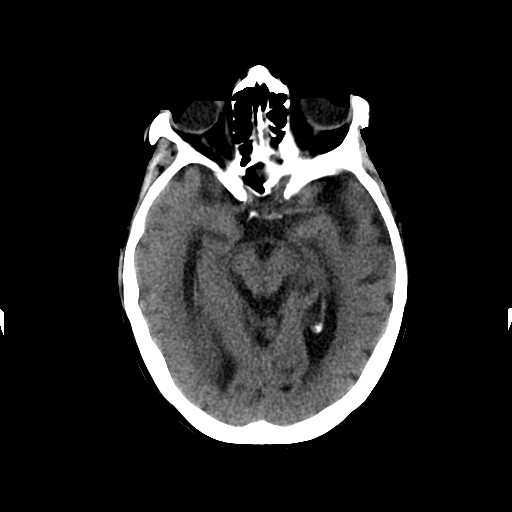
[im 12/32  bone]
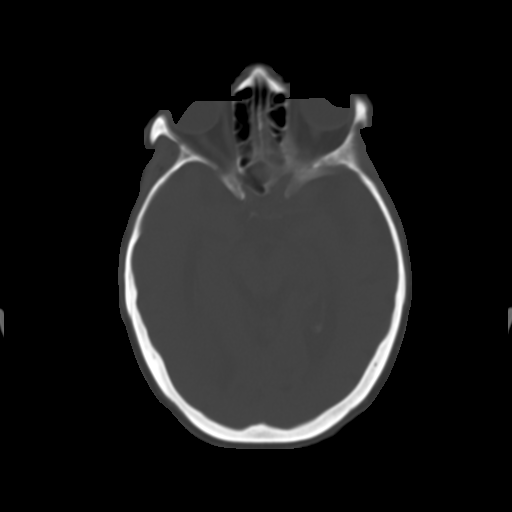
[im 14/32  brain]
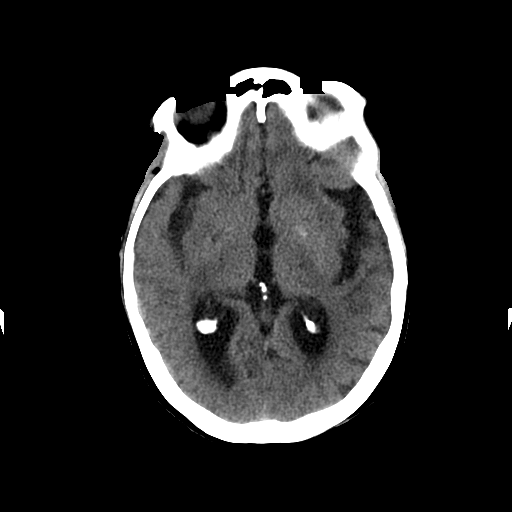
[im 16/32  brain]
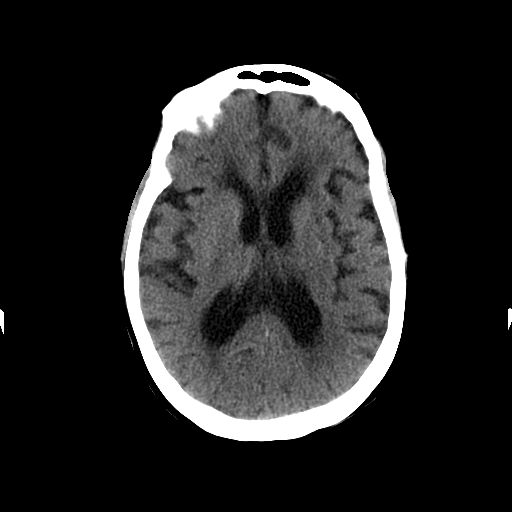
[im 18/32  brain]
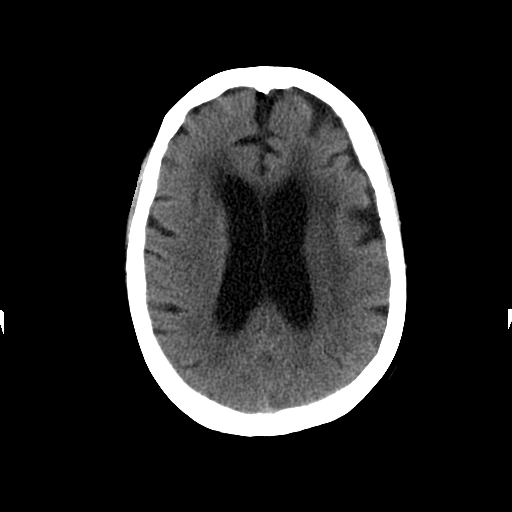
[im 20/32  brain]
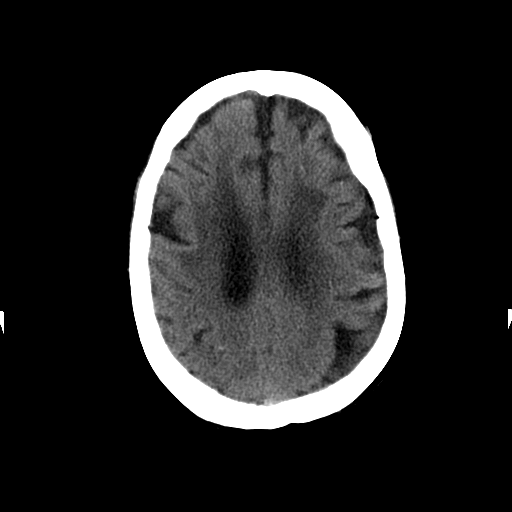
[im 20/32  bone]
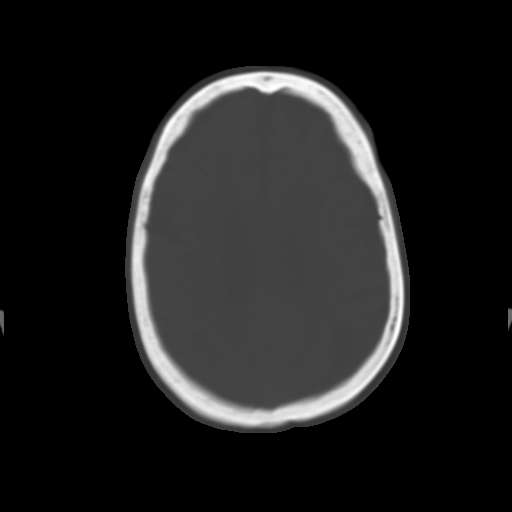
[im 23/32  brain]
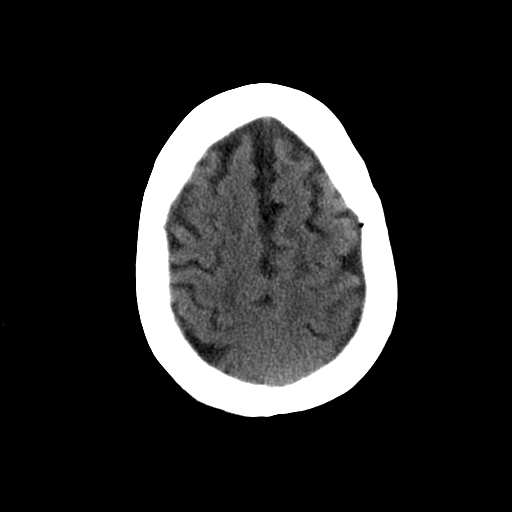
[im 25/32  brain]
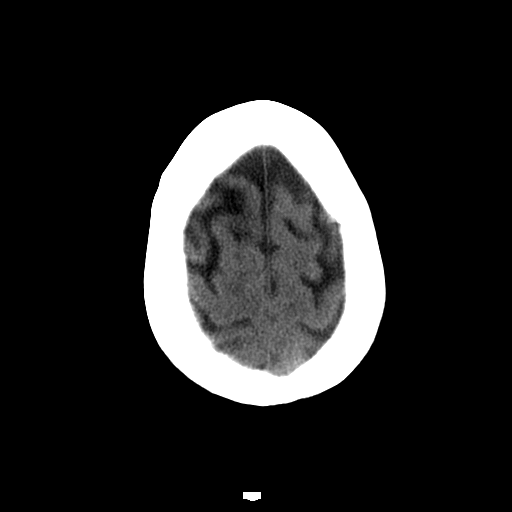
[im 27/32  brain]
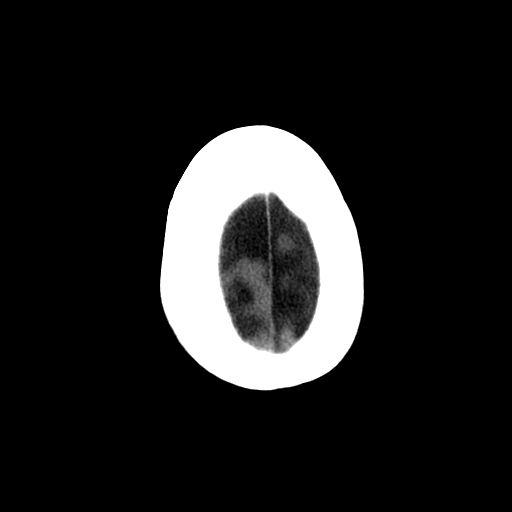
[im 29/32  brain]
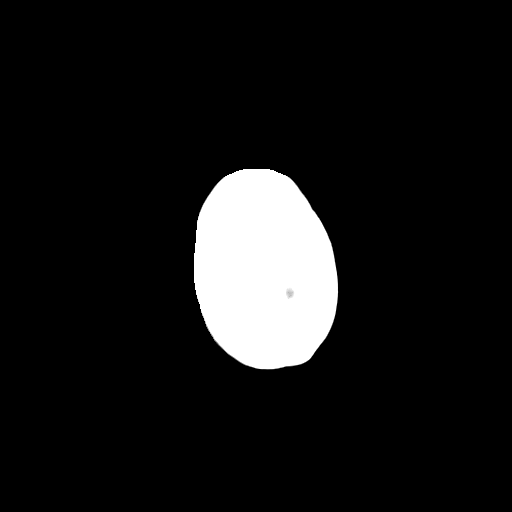
[im 29/32  bone]
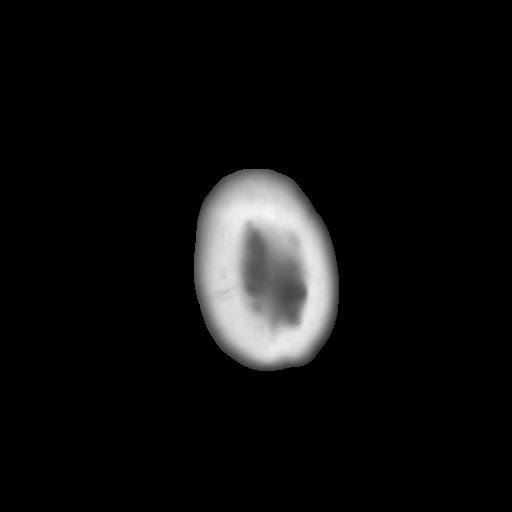

[Series 202: head w/o bone, idose (1) · axial · non-contrast · 0.49mm/px · z∈[+215,+235]mm · 2 of 32 slices shown]
[im 3/32  bone]
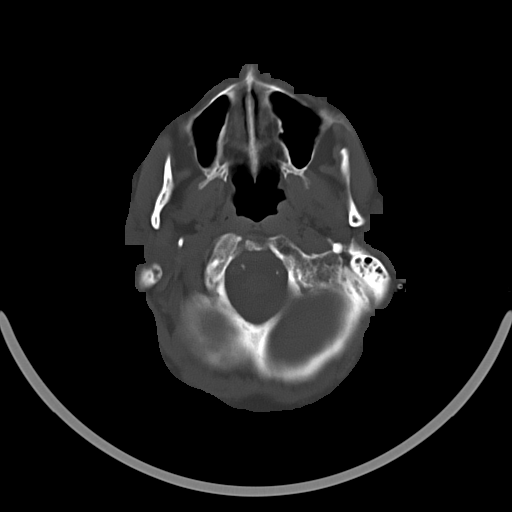
[im 7/32  bone]
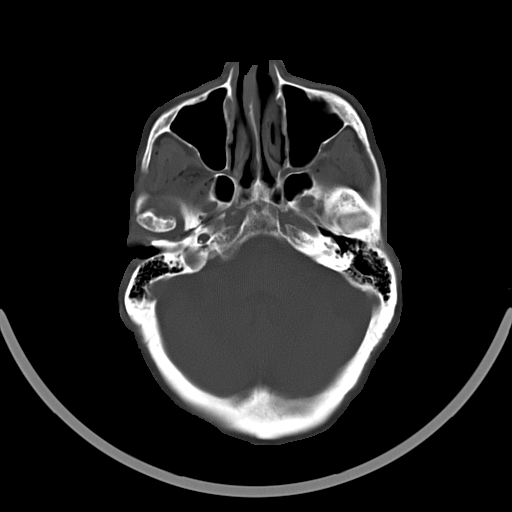

[15 of 30 positions shown; findings below may reference images not displayed]

FINDINGS: The study is mildly motion degraded throughout. There is no evidence
of acute large territory infarct, intracranial hemorrhage, mass,
midline shift, or extra-axial fluid collection. Moderate cerebral
atrophy is unchanged. Cerebral white matter hypodensities are
unchanged and nonspecific but compatible with moderate chronic small
vessel ischemic disease. Small, chronic infarcts are again seen in
the region of the left putamen and in the right cerebellum.

Orbits are unremarkable. The paranasal sinuses and mastoid air cells
are clear. Calcified atherosclerosis is noted at the skullbase. Gas
in the infratemporal fossae and right cavernous sinus is likely
venous and may be related to recent venipuncture.
IMPRESSION: 1. Motion degraded examination without acute intracranial
abnormality identified.
2. Moderate chronic small vessel ischemic disease.
These results were called by telephone at the time of interpretation
on 02/01/2016 at [DATE] to Dr. Jhon Daniel, who verbally acknowledged
these results.

## 2017-08-01 ENCOUNTER — Encounter: Payer: Self-pay | Admitting: Gerontology

## 2017-08-01 NOTE — Progress Notes (Signed)
Location:   [facility] Room Number: 325A Place of Service:  SNF [phone]) Provider:  [name], NP-C  Patient, [name]  Patient Care Team: Patient, [name] as PCP - General (General Practice)  Extended Emergency Contact Information Primary Emergency Contact: [name],[name]          Bruna Potter, [facility] [place] Phone: [phone] Relation: Daughter Secondary Emergency Contact: [name] Address: [address]          [place], [place] [address] [place] of [place] Home Phone: [phone] Mobile Phone: [phone] Relation: Daughter  Code Status:  DNR Goals of care: Advanced Directive information Advanced Directives 08/01/2017  Does Patient Have a Medical Advance Directive? Yes  Type of Advance Directive Out of facility DNR (pink MOST or yellow form)  Does patient want to make changes to medical advance directive? No - Patient declined  Copy of Healthcare Power of Attorney in Chart? -  Would patient like information on creating a medical advance directive? -  Pre-existing out of facility DNR order (yellow form or pink MOST form) -     Chief Complaint  Patient presents with  . Acute Visit    Follow up on toe     HPI:  Pt is a 81 y.o. female seen today for an acute visit for toe infection. Pt has an unstageable wound to the Right Third Toe. The tip of the toe is covered with thick, black eschar. Wound RN cross-hatched the eschar today with a scalpel. Wound RN notified me of peri-wound tissue condition. The tissue surrounding the eschar and nail bed is moderately red, moderately warm to touch and painful. Minor serous drainage. Brisk cap refills. B-pedal pulses palpable and equal. Pt is essentially non-verbal. Difficult to obtain ROS. VSS. Afebrile. No other complaints.    Past Medical History:  Diagnosis Date  . Anemia    "as a child" (08/05/2013)  . Anxiety   . Aphasia as late effect of stroke 04/18/2017  . Arthritis     "all over me; in all my joints" (08/05/2013)  . Atrial fibrillation (HCC)    a. Chronic; No longer on coumadin 2/2 frequent falls.  . Chest pain    a. 08/2011 Myoview: sm, fixed anteroseptal defect (attenuation), no ischemia, EF 62%.  . CHF (congestive heart failure) (HCC)   . Chronic atrial fibrillation (HCC) 04/18/2017  . Coccyx pain   . COPD (chronic obstructive pulmonary disease) (HCC)   . Dementia   . Dementia in Alzheimer's disease 04/18/2017  . Depression   . Diabetes mellitus type 2, controlled, without complications (HCC) 04/18/2017  . Falls frequently    coumadin stopped  . GERD (gastroesophageal reflux disease)   . Gout    "right foot" (08/05/2013)  . H/O hiatal hernia   . History of pneumonia    "couple times; long time ago" (08/05/2013)  . Hypercholesterolemia   . Hypertension   . Hypertrophic cardiomyopathy (HCC)    a. 11/2013 Echo: EF 55-60%, basal inf HK, sev dil LA, no evidence of HCM.  PASP .  [name] Hyponatremia   . Hyposmolality   . Osteoarthritis   . Oxygen dependent    "3L 24/7" (08/05/2013)  . Pacemaker    a. 04/2012 MDT [id number] [employer] PPM, ser #: [id number] H.  . Pulmonary HTN (HCC)    a. has had prior right heart cath in 2010; was felt that most likely due to elevated left sided pressures and would not benefit  from vasodilator therapy;  b. 11/2013 Echo: PASP .  [name] Simple chronic bronchitis (HCC) 04/18/2017  . Situational mixed anxiety and depressive disorder   . TIA (transient ischemic attack)   . Type II diabetes mellitus (HCC)   . Varicose veins    Past Surgical History:  Procedure Laterality Date  . ABDOMINAL HYSTERECTOMY     "partial" (08/05/2013)  . APPENDECTOMY    . BACK SURGERY     "bone spur removed; mid back" (08/05/2013)  . CARDIAC CATHETERIZATION     "more than once" (08/05/2013)  . DILATION AND CURETTAGE OF UTERUS     "several" (08/05/2013)  . FOOT NEUROMA SURGERY Right   . INSERT / REPLACE / REMOVE PACEMAKER  1998; 2000's; 2014    . PACEMAKER GENERATOR CHANGE N/A 04/24/2012   Procedure: PACEMAKER GENERATOR CHANGE;  Surgeon: Duke Salvia, MD;  [facility];  Service: Cardiovascular;  Laterality: N/A;  . TONSILLECTOMY    . TOTAL KNEE ARTHROPLASTY Right 2011    Allergies  Allergen Reactions  . Aspirin Nausea And Vomiting  . Ativan [Lorazepam] Other (See Comments)    Hallucinations  . Atorvastatin     Other reaction(s): Unknown  . Calan [Verapamil Hcl] Other (See Comments)    Patient states it makes her "out of her mind"; bp bottoms out (takes diltiazem at home)  . Codeine Nausea And Vomiting  . Crestor [Rosuvastatin Calcium] Other (See Comments)    Muscle pain  . Escitalopram Oxalate Other (See Comments)    Reaction Unknown   . Lactose Intolerance (Gi) Diarrhea    Can tolerate in small amounts  . Lexapro [Escitalopram] Other (See Comments)    Unknown   . Lipitor [Atorvastatin Calcium] Other (See Comments)    myalgia  . Lopressor [Metoprolol Tartrate] Other (See Comments)    Blood pressure bottoms out   . Metoprolol Tartrate Other (See Comments)    Unknown   . Nitroglycerin Other (See Comments)    Reaction unknown  . Norvasc [Amlodipine Besylate] Other (See Comments)    fatigue  . Nsaids Nausea Only  . Oxycodone Other (See Comments)    Reaction unknown  . Percocet [Oxycodone-Acetaminophen] Nausea And Vomiting  . Prednisolone     Other reaction(s): Unknown  . Prednisone Itching and Swelling  . Rosuvastatin     Other reaction(s): Unknown  . Sulfa Antibiotics Nausea Only and Other (See Comments)    Makes her stomach hurt  . Tape Other (See Comments)    Skin tears & bruises easily (SKIN IS THIN!!)  . Verapamil     Other reaction(s): Unknown  . Vimovo [Naproxen-Esomeprazole] Itching, Nausea Only and Swelling  . Zoloft [Sertraline] Other (See Comments)    UNKNOWN   . Penicillins Hives and Rash    Has patient had a PCN reaction causing immediate rash, facial/tongue/throat swelling,  SOB or lightheadedness with hypotension: Yes Has patient had a PCN reaction causing severe rash involving mucus membranes or skin necrosis: No Has patient had a PCN reaction that required hospitalization No Has patient had a PCN reaction occurring within the last 10 years: No If all of the above answers are "NO", then may proceed with Cephalosporin use.     Allergies as of 07/18/2017      Reactions   Aspirin Nausea And Vomiting   Ativan [lorazepam] Other (See Comments)   Hallucinations   Atorvastatin    Other reaction(s): Unknown   Calan [verapamil Hcl] Other (See Comments)   Patient states it makes  her "out of her mind"; bp bottoms out (takes diltiazem at home)   Codeine Nausea And Vomiting   Crestor [rosuvastatin Calcium] Other (See Comments)   Muscle pain   Escitalopram Oxalate Other (See Comments)   Reaction Unknown   Lactose Intolerance (gi) Diarrhea   Can tolerate in small amounts   Lexapro [escitalopram] Other (See Comments)   Unknown   Lipitor [atorvastatin Calcium] Other (See Comments)   myalgia   Lopressor [metoprolol Tartrate] Other (See Comments)   Blood pressure bottoms out   Metoprolol Tartrate Other (See Comments)   Unknown   Nitroglycerin Other (See Comments)   Reaction unknown   Norvasc [amlodipine Besylate] Other (See Comments)   fatigue   Nsaids Nausea Only   Oxycodone Other (See Comments)   Reaction unknown   Percocet [oxycodone-acetaminophen] Nausea And Vomiting   Prednisolone    Other reaction(s): Unknown   Prednisone Itching, Swelling   Rosuvastatin    Other reaction(s): Unknown   Sulfa Antibiotics Nausea Only, Other (See Comments)   Makes her stomach hurt   Tape Other (See Comments)   Skin tears & bruises easily (SKIN IS THIN!!)   Verapamil    Other reaction(s): Unknown   Vimovo [naproxen-esomeprazole] Itching, Nausea Only, Swelling   Zoloft [sertraline] Other (See Comments)   UNKNOWN   Penicillins Hives, Rash   Has patient had a PCN  reaction causing immediate rash, facial/tongue/throat swelling, SOB or lightheadedness with hypotension: Yes Has patient had a PCN reaction causing severe rash involving mucus membranes or skin necrosis: No Has patient had a PCN reaction that required hospitalization No Has patient had a PCN reaction occurring within the last 10 years: No If all of the above answers are "NO", then may proceed with Cephalosporin use.      Medication List       Accurate as of 07/18/17 11:59 PM. Always use your most recent med list.          acetaminophen 325 MG tablet Commonly known as:  TYLENOL Take 650 mg by mouth 4 (four) times daily.   ALPRAZolam 0.25 MG tablet Commonly known as:  XANAX Take 1 tablet (0.25 mg total) by mouth 2 (two) times daily. At 9 am and 9 pm   ALPRAZolam 0.25 MG tablet Commonly known as:  XANAX Take 1 tablet (0.25 mg total) by mouth every 4 (four) hours as needed for anxiety.   alum & mag hydroxide-simeth 400-400-40 MG/5ML suspension Commonly known as:  MAALOX PLUS Take 30 mLs by mouth every 4 (four) hours as needed.   benzocaine 10 % mucosal gel Commonly known as:  ORAJEL Apply thin film to area of soreness on gums every 2 hours as needed   bisacodyl 10 MG suppository Commonly known as:  DULCOLAX Place 10 mg rectally daily as needed.   clopidogrel 75 MG tablet Commonly known as:  PLAVIX Take 1 tablet (75 mg total) by mouth daily.   collagenase ointment Commonly known as:  SANTYL Apply 1 application topically daily. Cleanse right 3rd toe with ns, apply santyl nickel thick to area daily.   diltiazem 60 MG tablet Commonly known as:  CARDIZEM Take 60 mg by mouth 3 (three) times daily.   divalproex 125 MG capsule Commonly known as:  DEPAKOTE SPRINKLE Take 125 mg by mouth 3 (three) times daily with meals.   famotidine 40 MG tablet Commonly known as:  PEPCID Take 40 mg by mouth daily. For use while on oxycodone   mirtazapine 7.5 MG tablet Commonly known  as:   REMERON Take 7.5 mg by mouth at bedtime.   oxybutynin 5 MG tablet Commonly known as:  DITROPAN Take 5 mg by mouth 2 (two) times daily.   oxyCODONE 20 MG/ML concentrated solution Commonly known as:  ROXICODONE INTENSOL Give 0.25 ml - 0.5 ml by mouth every 2 hours as needed for pain, dyspnea. Monitor for reaction. Per EPIC allergic reaction is GI upset.  0.25 - mild; 0.5 ml - moderate to severe   senna 8.6 MG Tabs tablet Commonly known as:  SENOKOT Take 1 tablet by mouth 2 (two) times daily.       Review of Systems  Unable to perform ROS: Dementia  Constitutional: Negative for activity change, appetite change, chills, diaphoresis, fatigue and fever.  HENT: Negative.   Respiratory: Negative.   Cardiovascular: Negative.   Gastrointestinal: Negative.   Genitourinary: Negative.   Musculoskeletal: Negative.   Skin: Positive for wound.  Neurological: Negative.   Hematological: Negative.   Psychiatric/Behavioral: Negative.     Immunization History  Administered Date(s) Administered  . Influenza Split 09/11/2012  . Influenza Whole 10/15/2010  . Influenza-Unspecified 08/10/2013, 02/06/2016, 08/27/2016  . PPD Test 08/24/2013, 02/06/2016, 02/13/2017  . Pneumococcal Polysaccharide-23 10/15/2010  . Pneumococcal-Unspecified 08/10/2013, 02/06/2016  . Tdap 10/01/2013   Pertinent  Health Maintenance Due  Topic Date Due  . FOOT EXAM  06/18/1946  . OPHTHALMOLOGY EXAM  06/18/1946  . URINE MICROALBUMIN  06/18/1946  . PNA vac Low Risk Adult (2 of 2 - PCV13) 02/05/2017  . HEMOGLOBIN A1C  03/17/2017  . INFLUENZA VACCINE  06/11/2017  . DEXA SCAN  Completed   Fall Risk  05/18/2013  Falls in the past year? Yes  Number falls in past yr: 1  Injury with Fall? No  Risk for fall due to : Impaired balance/gait;Impaired mobility   Functional Status Survey:    Vitals:   07/18/17 1433  BP: 112/89  Pulse: 61  Resp: 20  Temp: 97.9 F (36.6 C)  SpO2: 99%  Weight: 113 lb 1.6 oz (51.3 kg)   Height: 5' (1.524 m)   Body mass index is 22.09 kg/m. Physical Exam  Constitutional: She appears well-developed and well-nourished.  HENT:  Head: Normocephalic and atraumatic.  Neck: Normal range of motion.  Pulmonary/Chest: Effort normal.  Neurological: She is alert. She is disoriented. Coordination and gait normal.  Skin: Skin is warm and dry. There is erythema. No cyanosis. Nails show no clubbing.  Right third tip of toe- eschar/ unstageable wound with infection  Psychiatric: She has a normal mood and affect. Her speech is normal and behavior is normal. Thought content normal. Cognition and memory are impaired. She expresses impulsivity. She exhibits abnormal recent memory and abnormal remote memory.  Nursing note and vitals reviewed.   Labs reviewed:  Recent Labs  09/16/16 2056 09/16/16 2103  NA 142 140  K 4.1 4.1  CL 107 105  CO2  --  25  GLUCOSE 116* 126*  BUN 32* 26*  CREATININE 1.00 1.15*  CALCIUM  --  9.9    Recent Labs  09/16/16 2103  AST 29  ALT 14  ALKPHOS 84  BILITOT 1.0  PROT 8.0  ALBUMIN 4.4    Recent Labs  09/16/16 2056 09/16/16 2103  WBC  --  5.6  NEUTROABS  --  3.4  HGB 14.6 15.0  HCT 43.0 44.2  MCV  --  98.0  PLT  --  194   Lab Results  Component Value Date   TSH 1.674  02/07/2015   Lab Results  Component Value Date   HGBA1C 5.6 09/17/2016   Lab Results  Component Value Date   CHOL 288 (H) 09/17/2016   HDL 43 09/17/2016   LDLCALC 207 (H) 09/17/2016   TRIG 190 (H) 09/17/2016   CHOLHDL 6.7 09/17/2016    Significant Diagnostic Results in last 30 days:  No results found.  Assessment/Plan 1. Toe infection  Wound RN- performed cross-hatching of eschar  Soak foot in very warm water with 1/4 cup Epsom salt Q Day. Soak until water is cool. Dry well  Apply TAO ointment and Bandaid  Doxycycline 100 mg po Q 12 hours x 7 days   Family/ staff Communication:   Total Time:  Documentation:  Face to  Face:  Family/Phone:   Labs/tests ordered:    Medication list reviewed and assessed for continued appropriateness.  Brynda Rim, NP-C Geriatrics [facility] [phone] [address], [place] [address] Cell Phone (Mon-Fri 8am-5pm):  [phone] On Call:  [phone] & follow prompts after 5pm & weekends Office Phone:  [phone] Office Fax:  [phone]

## 2017-08-02 IMAGING — CT CT HEAD W/O CM
3 of 4 series · 17 of 30 positions shown, 19 images · non-contrast
Comparison: CT dated 02/01/2016

ADDENDUM:
These results were called by telephone at the time of interpretation
on 02/03/2016 at [DATE] to nurse practitioner Brobakken who verbally
acknowledged these results.
CLINICAL DATA: 79-year-old female with altered mental status

EXAM:
CT HEAD WITHOUT CONTRAST
TECHNIQUE: Contiguous axial images were obtained from the base of the skull
through the vertex without intravenous contrast.

[Series 201: head w/o, idose (1) · axial · non-contrast · 0.42mm/px · z∈[+115,+215]mm · 5 of 32 slices shown (1 of 2)]
[im 6/32  brain]
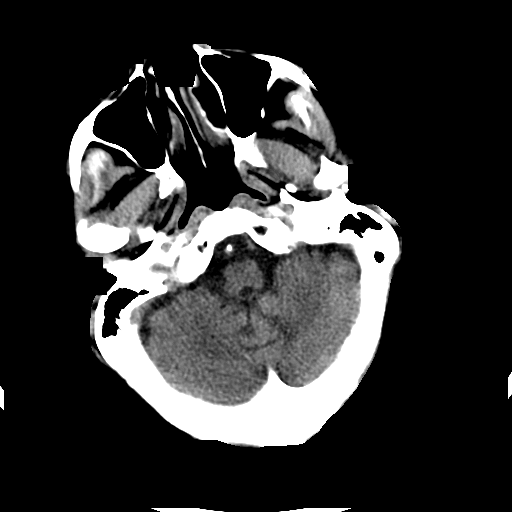
[im 11/32  brain]
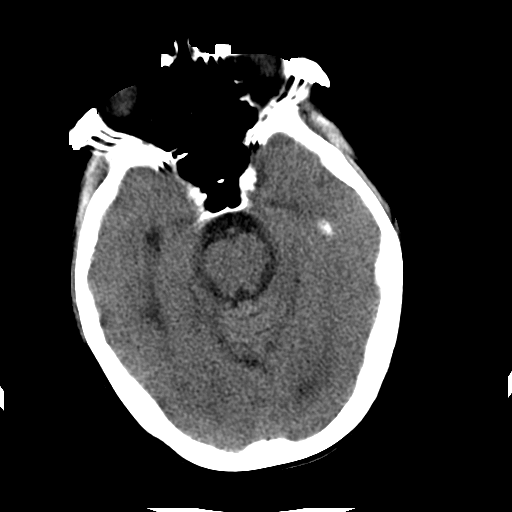
[im 16/32  brain]
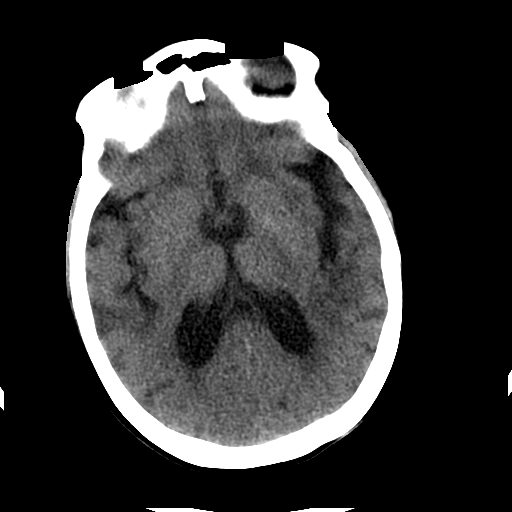
[im 21/32  brain]
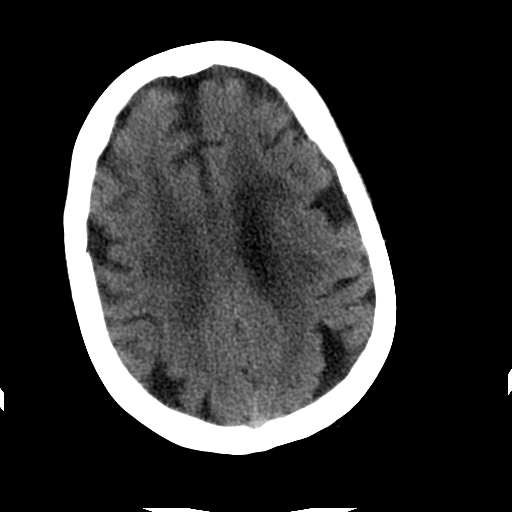
[im 26/32  brain]
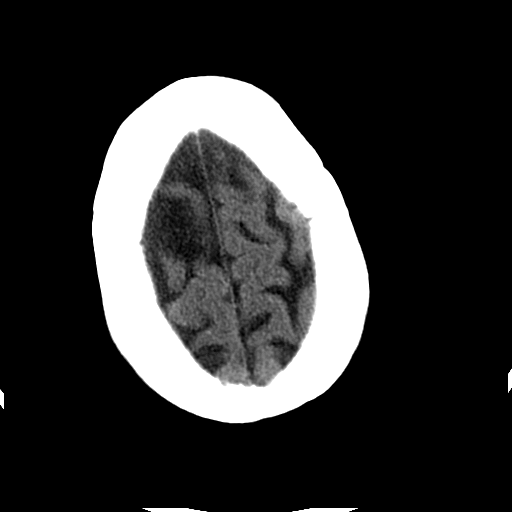

[Series 301: head w/o, idose (1) · axial · non-contrast · 0.42mm/px · z∈[+110,+220]mm · 6 of 32 slices shown, 8 images (2 of 2)]
[im 5/32  brain]
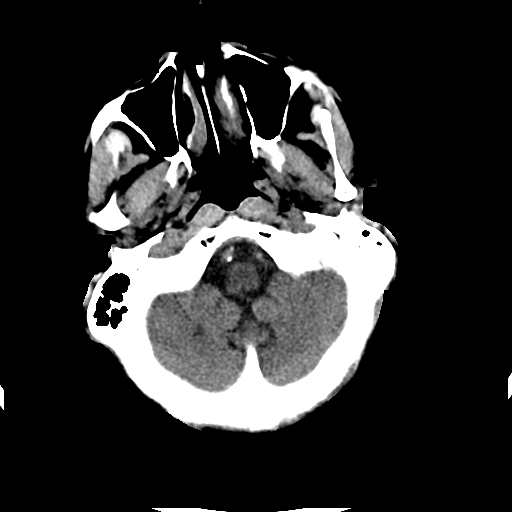
[im 5/32  bone]
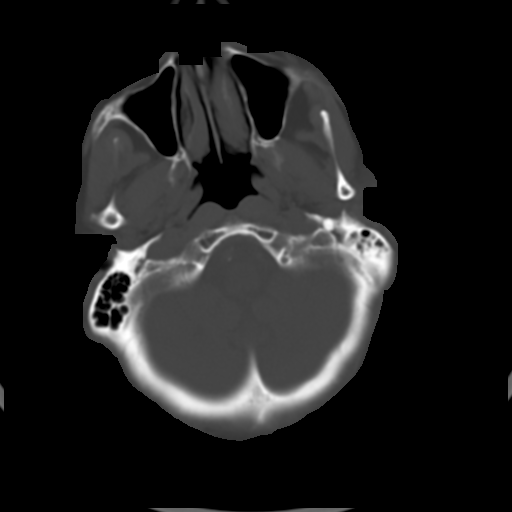
[im 9/32  brain]
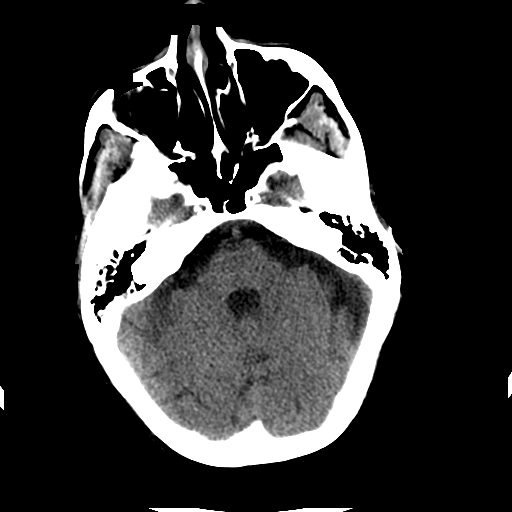
[im 14/32  brain]
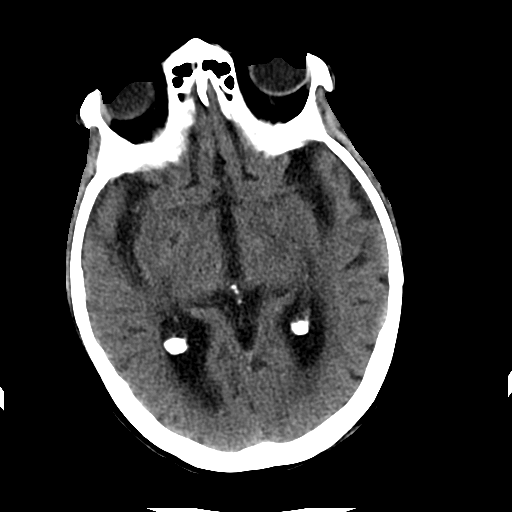
[im 18/32  brain]
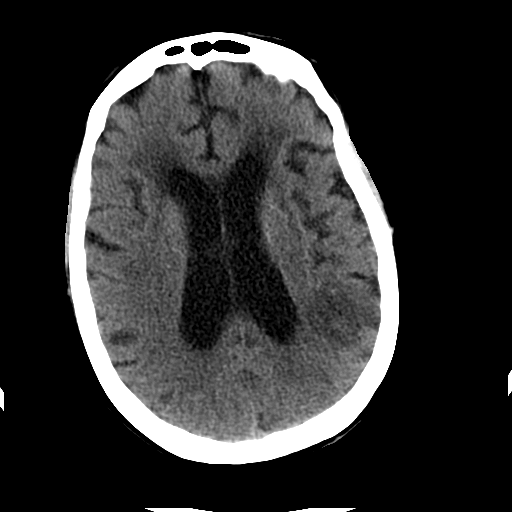
[im 23/32  brain]
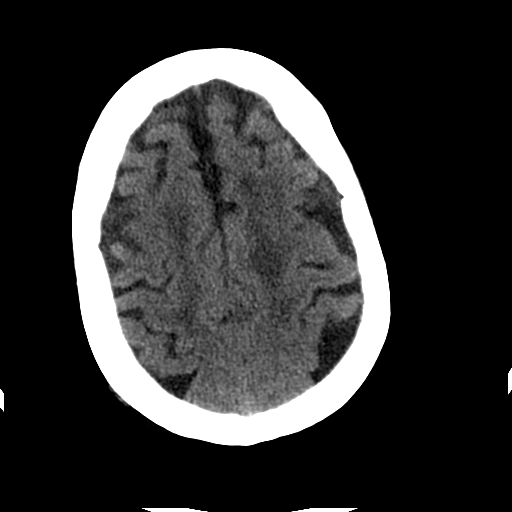
[im 23/32  bone]
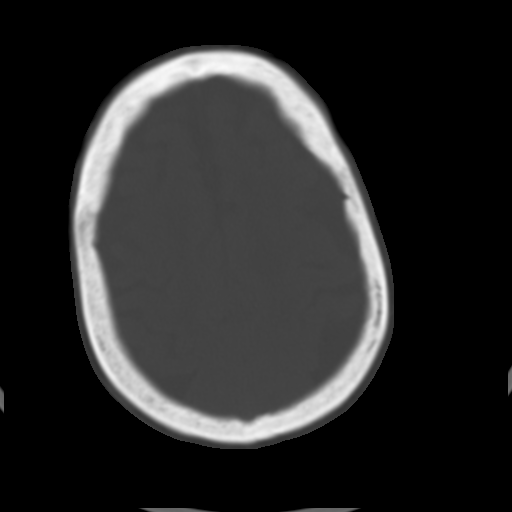
[im 27/32  brain]
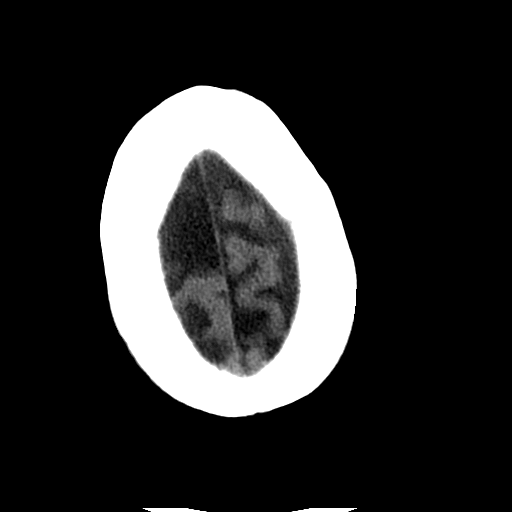

[Series 302: head w/o bone, idose (1) · axial · non-contrast · 0.42mm/px · z∈[+110,+220]mm · 6 of 32 slices shown]
[im 5/32  bone]
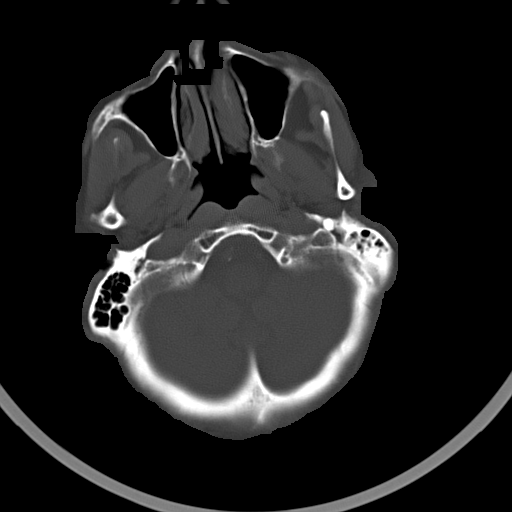
[im 9/32  bone]
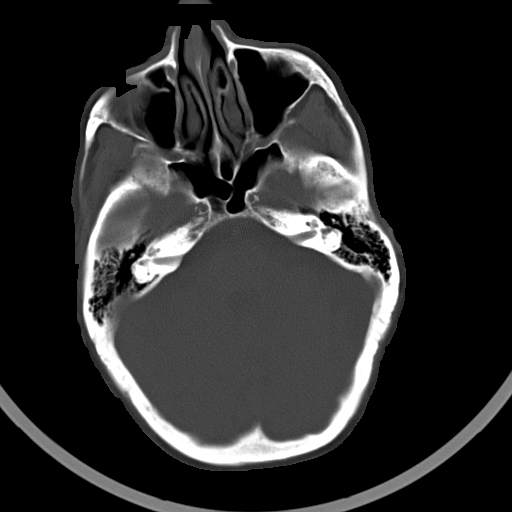
[im 14/32  bone]
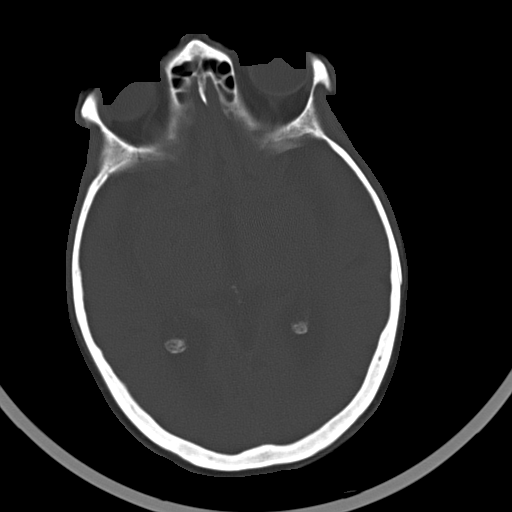
[im 18/32  bone]
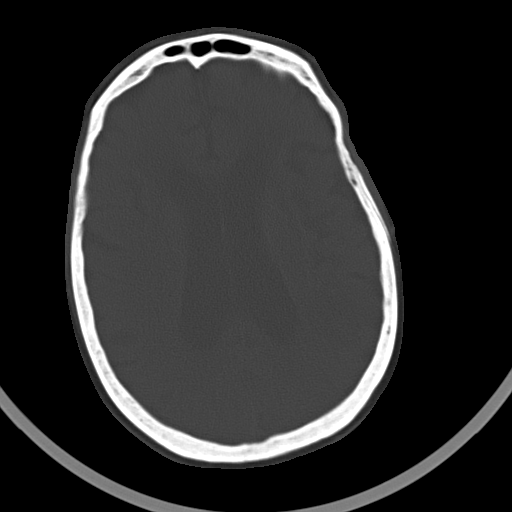
[im 23/32  bone]
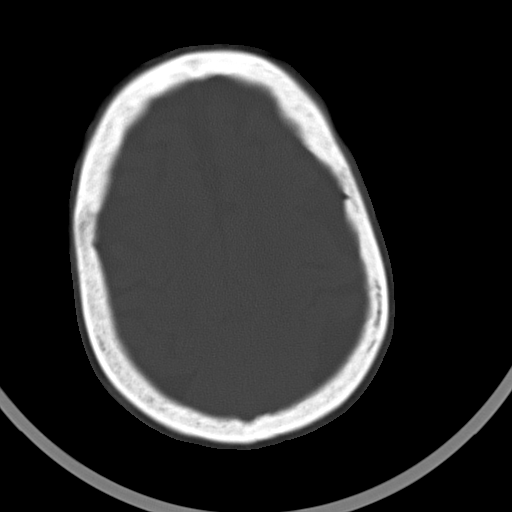
[im 27/32  bone]
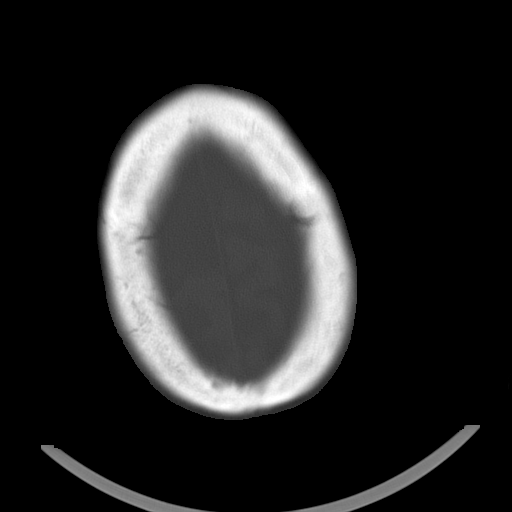

[17 of 30 positions shown; findings below may reference images not displayed]

FINDINGS: The ventricles are dilated and the sulci are prominent compatible
with age-related atrophy. Periventricular and deep white matter
hypodensities represent chronic microvascular ischemic changes.
There is a wedge-shaped area of hypodensity in the left posterior
parietal/ occipital lobe compatible with an infarct, possibly
subacute. An acute infarct is not excluded. Clinical correlation is
recommended. There is no intracranial hemorrhage. No mass effect or
midline shift identified.

The visualized paranasal sinuses and mastoid air cells are well
aerated. The calvarium is intact.
IMPRESSION: No acute intracranial hemorrhage.

Left posterior parietal/occipital wedge-shaped hypodensity
compatible with an infarct, likely subacute or less likely acute.
Correlation with clinical exam and further evaluation with MRI
recommended.

Age-related atrophy and chronic microvascular ischemic disease.

## 2017-08-07 ENCOUNTER — Other Ambulatory Visit
Admission: RE | Admit: 2017-08-07 | Discharge: 2017-08-07 | Disposition: A | Discharge status: Home / Self Care | Source: Ambulatory Visit | Attending: Gerontology | Admitting: Gerontology

## 2017-08-07 ENCOUNTER — Non-Acute Institutional Stay (SKILLED_NURSING_FACILITY): Payer: Medicare Other | Admitting: Gerontology

## 2017-08-07 ENCOUNTER — Encounter: Payer: Self-pay | Admitting: Gerontology

## 2017-08-07 DIAGNOSIS — F418 Other specified anxiety disorders: Secondary | ICD-10-CM | POA: Diagnosis not present

## 2017-08-07 DIAGNOSIS — G629 Polyneuropathy, unspecified: Secondary | ICD-10-CM

## 2017-08-07 DIAGNOSIS — M6281 Muscle weakness (generalized): Secondary | ICD-10-CM | POA: Diagnosis not present

## 2017-08-07 DIAGNOSIS — F39 Unspecified mood [affective] disorder: Secondary | ICD-10-CM

## 2017-08-07 DIAGNOSIS — G4700 Insomnia, unspecified: Secondary | ICD-10-CM

## 2017-08-07 DIAGNOSIS — I110 Hypertensive heart disease with heart failure: Secondary | ICD-10-CM

## 2017-08-07 DIAGNOSIS — N3281 Overactive bladder: Secondary | ICD-10-CM

## 2017-08-07 DIAGNOSIS — Z9181 History of falling: Secondary | ICD-10-CM

## 2017-08-07 DIAGNOSIS — E785 Hyperlipidemia, unspecified: Secondary | ICD-10-CM

## 2017-08-07 DIAGNOSIS — I10 Essential (primary) hypertension: Secondary | ICD-10-CM | POA: Diagnosis not present

## 2017-08-07 DIAGNOSIS — I272 Pulmonary hypertension, unspecified: Secondary | ICD-10-CM

## 2017-08-07 DIAGNOSIS — J449 Chronic obstructive pulmonary disease, unspecified: Secondary | ICD-10-CM

## 2017-08-07 DIAGNOSIS — R1312 Dysphagia, oropharyngeal phase: Secondary | ICD-10-CM | POA: Diagnosis not present

## 2017-08-07 DIAGNOSIS — R4182 Altered mental status, unspecified: Secondary | ICD-10-CM | POA: Diagnosis present

## 2017-08-07 DIAGNOSIS — I480 Paroxysmal atrial fibrillation: Secondary | ICD-10-CM

## 2017-08-07 DIAGNOSIS — E43 Unspecified severe protein-calorie malnutrition: Secondary | ICD-10-CM

## 2017-08-07 DIAGNOSIS — K219 Gastro-esophageal reflux disease without esophagitis: Secondary | ICD-10-CM

## 2017-08-07 DIAGNOSIS — F0390 Unspecified dementia without behavioral disturbance: Secondary | ICD-10-CM

## 2017-08-07 DIAGNOSIS — N390 Urinary tract infection, site not specified: Secondary | ICD-10-CM

## 2017-08-07 DIAGNOSIS — L089 Local infection of the skin and subcutaneous tissue, unspecified: Secondary | ICD-10-CM

## 2017-08-07 DIAGNOSIS — J309 Allergic rhinitis, unspecified: Secondary | ICD-10-CM | POA: Diagnosis not present

## 2017-08-07 DIAGNOSIS — E119 Type 2 diabetes mellitus without complications: Secondary | ICD-10-CM

## 2017-08-07 DIAGNOSIS — Z950 Presence of cardiac pacemaker: Secondary | ICD-10-CM

## 2017-08-07 DIAGNOSIS — M1990 Unspecified osteoarthritis, unspecified site: Secondary | ICD-10-CM

## 2017-08-07 LAB — URINALYSIS, COMPLETE (UACMP) WITH MICROSCOPIC
Bilirubin Urine: NEGATIVE
Glucose, UA: NEGATIVE mg/dL
Hgb urine dipstick: NEGATIVE
KETONES UR: NEGATIVE mg/dL
Nitrite: POSITIVE — AB
PROTEIN: NEGATIVE mg/dL
SQUAMOUS EPITHELIAL / LPF: NONE SEEN
Specific Gravity, Urine: 1.015 (ref 1.005–1.030)
pH: 6 (ref 5.0–8.0)

## 2017-08-07 NOTE — Progress Notes (Signed)
Location:   [facility] Room Number: 325A Place of Service:  SNF [phone]) Provider:  [name], NP-C  Patient, [name]  Patient Care Team: Patient, [name] as PCP - General (General Practice)  Extended Emergency Contact Information Primary Emergency Contact: [name],[name]          Bruna Potter, [place] Phone: [phone] Relation: Daughter Secondary Emergency Contact: [name] Address: [address]          [place], [place] [address] [place] Home Phone: [phone] Mobile Phone: [phone] Relation: Daughter  Code Status:  DNR Goals of care: Advanced Directive information Advanced Directives 08/07/2017  Does Patient Have a Medical Advance Directive? Yes  Type of Advance Directive Out of facility DNR (pink MOST or yellow form)  Does patient want to make changes to medical advance directive? No - Patient declined  Copy of Healthcare Power of Attorney in Chart? -  Would patient like information on creating a medical advance directive? -  Pre-existing out of facility DNR order (yellow form or pink MOST form) -     Chief Complaint  Patient presents with  . Medical Management of Chronic Issues    Routine Visit    HPI:  Pt is a 81 y.o. female seen today for medical management of chronic diseases. Pt has had a slow decline over the past year. She is on Hospice services. Pt is now non-verbal- makes attempts to speak, but cannot be understood. Speech garbled/mumbled. Requires assistance with feeding. Incontinent of bowel and bladder. Wears protective briefs. Occasionally attempts to get up unassisted. She has not had any falls this past month. [place] for safety monitoring. Dressing covering irritated right third toe. Daily dressing changes with Santyl after Epsom salt soaks. Nursing noticed increased confusion. Obtained urine sample. Pt has UTI- E-coli. sensitive to  Cipro. Unable to obtain complete ROS d/t dementia. VSS   Past Medical History:  Diagnosis Date  . Anemia    "as a child" (08/05/2013)  . Anxiety   . Aphasia as late effect of stroke 04/18/2017  . Arthritis    "all over me; in all my joints" (08/05/2013)  . Atrial fibrillation (HCC)    a. Chronic; No longer on coumadin 2/2 frequent falls.  . Chest pain    a. 08/2011 Myoview: sm, fixed anteroseptal defect (attenuation), no ischemia, EF 62%.  . CHF (congestive heart failure) (HCC)   . Chronic atrial fibrillation (HCC) 04/18/2017  . Coccyx pain   . COPD (chronic obstructive pulmonary disease) (HCC)   . Dementia   . Dementia in Alzheimer's disease 04/18/2017  . Depression   . Diabetes mellitus type 2, controlled, without complications (HCC) 04/18/2017  . Falls frequently    coumadin stopped  . GERD (gastroesophageal reflux disease)   . Gout    "right foot" (08/05/2013)  . H/O hiatal hernia   . History of pneumonia    "couple times; long time ago" (08/05/2013)  . Hypercholesterolemia   . Hypertension   . Hypertrophic cardiomyopathy (HCC)    a. 11/2013 Echo: EF 55-60%, basal inf HK, sev dil LA, no evidence of HCM.  PASP .  [name] Hyponatremia   . Hyposmolality   . Osteoarthritis   . Oxygen dependent    "3L 24/7" (08/05/2013)  . Pacemaker    a. 04/2012 MDT [id number] [employer] PPM, ser #: [id number] H.  . Pulmonary HTN (HCC)  a. has had prior right heart cath in 2010; was felt that most likely due to elevated left sided pressures and would not benefit from vasodilator therapy;  b. 11/2013 Echo: PASP .  [name] Simple chronic bronchitis (HCC) 04/18/2017  . Situational mixed anxiety and depressive disorder   . TIA (transient ischemic attack)   . Type II diabetes mellitus (HCC)   . Varicose veins    Past Surgical History:  Procedure Laterality Date  . ABDOMINAL HYSTERECTOMY     "partial" (08/05/2013)  . APPENDECTOMY    . BACK SURGERY     "bone spur removed; mid back" (08/05/2013)  .  CARDIAC CATHETERIZATION     "more than once" (08/05/2013)  . DILATION AND CURETTAGE OF UTERUS     "several" (08/05/2013)  . FOOT NEUROMA SURGERY Right   . INSERT / REPLACE / REMOVE PACEMAKER  1998; 2000's; 2014  . PACEMAKER GENERATOR CHANGE N/A 04/24/2012   Procedure: PACEMAKER GENERATOR CHANGE;  Surgeon: Duke Salvia, MD;  Location: [facility] CATH LAB;  Service: Cardiovascular;  Laterality: N/A;  . TONSILLECTOMY    . TOTAL KNEE ARTHROPLASTY Right 2011    Allergies  Allergen Reactions  . Aspirin Nausea And Vomiting  . Ativan [Lorazepam] Other (See Comments)    Hallucinations  . Atorvastatin     Other reaction(s): Unknown  . Calan [Verapamil Hcl] Other (See Comments)    Patient states it makes her "out of her mind"; bp bottoms out (takes diltiazem at home)  . Codeine Nausea And Vomiting  . Crestor [Rosuvastatin Calcium] Other (See Comments)    Muscle pain  . Escitalopram Oxalate Other (See Comments)    Reaction Unknown   . Lactose Intolerance (Gi) Diarrhea    Can tolerate in small amounts  . Lexapro [Escitalopram] Other (See Comments)    Unknown   . Lipitor [Atorvastatin Calcium] Other (See Comments)    myalgia  . Lopressor [Metoprolol Tartrate] Other (See Comments)    Blood pressure bottoms out   . Metoprolol Tartrate Other (See Comments)    Unknown   . Nitroglycerin Other (See Comments)    Reaction unknown  . Norvasc [Amlodipine Besylate] Other (See Comments)    fatigue  . Nsaids Nausea Only  . Oxycodone Other (See Comments)    Reaction unknown  . Percocet [Oxycodone-Acetaminophen] Nausea And Vomiting  . Prednisolone     Other reaction(s): Unknown  . Prednisone Itching and Swelling  . Rosuvastatin     Other reaction(s): Unknown  . Sulfa Antibiotics Nausea Only and Other (See Comments)    Makes her stomach hurt  . Tape Other (See Comments)    Skin tears & bruises easily (SKIN IS THIN!!)  . Verapamil     Other reaction(s): Unknown  . Vimovo [Naproxen-Esomeprazole]  Itching, Nausea Only and Swelling  . Zoloft [Sertraline] Other (See Comments)    UNKNOWN   . Penicillins Hives and Rash    Has patient had a PCN reaction causing immediate rash, facial/tongue/throat swelling, SOB or lightheadedness with hypotension: Yes Has patient had a PCN reaction causing severe rash involving mucus membranes or skin necrosis: No Has patient had a PCN reaction that required hospitalization No Has patient had a PCN reaction occurring within the last 10 years: No If all of the above answers are "NO", then may proceed with Cephalosporin use.     Allergies as of 08/07/2017      Reactions   Aspirin Nausea And Vomiting   Ativan [lorazepam] Other (See Comments)   Hallucinations  Atorvastatin    Other reaction(s): Unknown   Calan [verapamil Hcl] Other (See Comments)   Patient states it makes her "out of her mind"; bp bottoms out (takes diltiazem at home)   Codeine Nausea And Vomiting   Crestor [rosuvastatin Calcium] Other (See Comments)   Muscle pain   Escitalopram Oxalate Other (See Comments)   Reaction Unknown   Lactose Intolerance (gi) Diarrhea   Can tolerate in small amounts   Lexapro [escitalopram] Other (See Comments)   Unknown   Lipitor [atorvastatin Calcium] Other (See Comments)   myalgia   Lopressor [metoprolol Tartrate] Other (See Comments)   Blood pressure bottoms out   Metoprolol Tartrate Other (See Comments)   Unknown   Nitroglycerin Other (See Comments)   Reaction unknown   Norvasc [amlodipine Besylate] Other (See Comments)   fatigue   Nsaids Nausea Only   Oxycodone Other (See Comments)   Reaction unknown   Percocet [oxycodone-acetaminophen] Nausea And Vomiting   Prednisolone    Other reaction(s): Unknown   Prednisone Itching, Swelling   Rosuvastatin    Other reaction(s): Unknown   Sulfa Antibiotics Nausea Only, Other (See Comments)   Makes her stomach hurt   Tape Other (See Comments)   Skin tears & bruises easily (SKIN IS THIN!!)    Verapamil    Other reaction(s): Unknown   Vimovo [naproxen-esomeprazole] Itching, Nausea Only, Swelling   Zoloft [sertraline] Other (See Comments)   UNKNOWN   Penicillins Hives, Rash   Has patient had a PCN reaction causing immediate rash, facial/tongue/throat swelling, SOB or lightheadedness with hypotension: Yes Has patient had a PCN reaction causing severe rash involving mucus membranes or skin necrosis: No Has patient had a PCN reaction that required hospitalization No Has patient had a PCN reaction occurring within the last 10 years: No If all of the above answers are "NO", then may proceed with Cephalosporin use.      Medication List       Accurate as of 08/07/17  2:49 PM. Always use your most recent med list.          acetaminophen 325 MG tablet Commonly known as:  TYLENOL Take 650 mg by mouth 4 (four) times daily.   ALPRAZolam 0.25 MG tablet Commonly known as:  XANAX Take 1 tablet (0.25 mg total) by mouth 2 (two) times daily. At 9 am and 9 pm   ALPRAZolam 0.25 MG tablet Commonly known as:  XANAX Take 1 tablet (0.25 mg total) by mouth every 4 (four) hours as needed for anxiety.   alum & mag hydroxide-simeth 400-400-40 MG/5ML suspension Commonly known as:  MAALOX PLUS Take 30 mLs by mouth every 4 (four) hours as needed.   benzocaine 10 % mucosal gel Commonly known as:  ORAJEL Apply thin film to area of soreness on gums every 2 hours as needed   bisacodyl 10 MG suppository Commonly known as:  DULCOLAX Place 10 mg rectally daily as needed.   clopidogrel 75 MG tablet Commonly known as:  PLAVIX Take 1 tablet (75 mg total) by mouth daily.   collagenase ointment Commonly known as:  SANTYL Apply 1 application topically daily. Cleanse right 3rd toe with ns, apply santyl nickel thick to area daily.   diltiazem 60 MG tablet Commonly known as:  CARDIZEM Take 60 mg by mouth 3 (three) times daily.   divalproex 125 MG capsule Commonly known as:  DEPAKOTE  SPRINKLE Take 125 mg by mouth 3 (three) times daily with meals.   famotidine 40 MG tablet Commonly  known as:  PEPCID Take 40 mg by mouth daily. For use while on oxycodone   mirtazapine 7.5 MG tablet Commonly known as:  REMERON Take 7.5 mg by mouth at bedtime.   oxybutynin 5 MG tablet Commonly known as:  DITROPAN Take 5 mg by mouth 2 (two) times daily.   oxyCODONE 20 MG/ML concentrated solution Commonly known as:  ROXICODONE INTENSOL Give 0.25 ml - 0.5 ml by mouth every 2 hours as needed for pain, dyspnea. Monitor for reaction. Per EPIC allergic reaction is GI upset.  0.25 - mild; 0.5 ml - moderate to severe   senna 8.6 MG Tabs tablet Commonly known as:  SENOKOT Take 1 tablet by mouth 2 (two) times daily.   UNABLE TO FIND Med Name: Epsom Salt crystals 100% : Use 1/4 cup of epsom salt with warm water daily. Soak until water cools, dry well and then do dressing.       Review of Systems  Unable to perform ROS: Dementia  Constitutional: Negative for activity change, appetite change and fever.  HENT: Negative for congestion, trouble swallowing and voice change.   Respiratory: Negative for apnea, cough, choking, shortness of breath and wheezing.   Cardiovascular: Negative for chest pain, palpitations and leg swelling.  Gastrointestinal: Negative for abdominal distention and abdominal pain.  Musculoskeletal: Positive for arthralgias (typical arthritis). Negative for back pain, gait problem and myalgias.  Skin: Negative for color change, pallor, rash and wound.  Psychiatric/Behavioral: Positive for confusion. Negative for agitation and behavioral problems.  All other systems reviewed and are negative.   Immunization History  Administered Date(s) Administered  . Influenza Split 09/11/2012  . Influenza Whole 10/15/2010  . Influenza-Unspecified 08/10/2013, 02/06/2016, 08/27/2016  . PPD Test 08/24/2013, 02/06/2016, 02/13/2017  . Pneumococcal Polysaccharide-23 10/15/2010  .  Pneumococcal-Unspecified 08/10/2013, 02/06/2016  . Tdap 10/01/2013   Pertinent  Health Maintenance Due  Topic Date Due  . FOOT EXAM  06/18/1946  . OPHTHALMOLOGY EXAM  06/18/1946  . URINE MICROALBUMIN  06/18/1946  . PNA vac Low Risk Adult (2 of 2 - PCV13) 02/05/2017  . HEMOGLOBIN A1C  03/17/2017  . INFLUENZA VACCINE  06/11/2017  . DEXA SCAN  Completed   Fall Risk  05/18/2013  Falls in the past year? Yes  Number falls in past yr: 1  Injury with Fall? No  Risk for fall due to : Impaired balance/gait;Impaired mobility   Functional Status Survey:    Vitals:   08/07/17 1417  BP: 112/89  Pulse: 61  Resp: 20  Temp: (!) 97.4 F (36.3 C)  SpO2: 99%  Weight: 114 lb 9.6 oz (52 kg)  Height: 5' (1.524 m)   Body mass index is 22.38 kg/m. Physical Exam  Constitutional: She is oriented to person, place, and time. Vital signs are normal. She appears well-developed and well-nourished. She is active and cooperative. She does not appear ill. No distress.  HENT:  Head: Normocephalic and atraumatic.  Mouth/Throat: Uvula is midline, oropharynx is clear and moist and mucous membranes are normal. Mucous membranes are not pale, not dry and not cyanotic.  Eyes: EOM and lids are normal.  Neck: Trachea normal, normal range of motion and full passive range of motion without pain. No JVD present. No tracheal deviation, no edema and no erythema present.  Cardiovascular: Normal rate, regular rhythm, intact distal pulses and normal pulses.  Exam reveals no gallop, no distant heart sounds and no friction rub.   No murmur heard. Pulses:      Dorsalis pedis pulses  are 2+ on the right side, and 2+ on the left side.  Pulmonary/Chest: Effort normal. No accessory muscle usage. No respiratory distress. She has decreased breath sounds in the right lower field and the left lower field. She has no wheezes. She has no rhonchi. She has no rales. She exhibits no tenderness.  Abdominal: Soft. Normal appearance and bowel  sounds are normal. She exhibits no distension and no ascites. There is no tenderness. There is no CVA tenderness.  Musculoskeletal: She exhibits no edema.  Expected osteoarthritis, stiffness  Neurological: She is alert and oriented to person, place, and time. She has normal strength.  Skin: Skin is warm, dry and intact. No rash noted. She is not diaphoretic. No cyanosis or erythema. No pallor. Nails show no clubbing.  Psychiatric: Her speech is normal. Her affect is labile. She is agitated. Thought content is delusional. Cognition and memory are impaired. She expresses impulsivity and inappropriate judgment. She exhibits abnormal recent memory and abnormal remote memory.  Nursing note and vitals reviewed.   Labs reviewed:  Recent Labs  09/16/16 2056 09/16/16 2103  NA 142 140  K 4.1 4.1  CL 107 105  CO2  --  25  GLUCOSE 116* 126*  BUN 32* 26*  CREATININE 1.00 1.15*  CALCIUM  --  9.9    Recent Labs  09/16/16 2103  AST 29  ALT 14  ALKPHOS 84  BILITOT 1.0  PROT 8.0  ALBUMIN 4.4    Recent Labs  09/16/16 2056 09/16/16 2103  WBC  --  5.6  NEUTROABS  --  3.4  HGB 14.6 15.0  HCT 43.0 44.2  MCV  --  98.0  PLT  --  194   Lab Results  Component Value Date   TSH 1.674 02/07/2015   Lab Results  Component Value Date   HGBA1C 5.6 09/17/2016   Lab Results  Component Value Date   CHOL 288 (H) 09/17/2016   HDL 43 09/17/2016   LDLCALC 207 (H) 09/17/2016   TRIG 190 (H) 09/17/2016   CHOLHDL 6.7 09/17/2016    Significant Diagnostic Results in last 30 days:  No results found.  Assessment/Plan 1. Dysphagia, oropharyngeal phase  Diet: mechanical soft  Nectar thick liquids  Medications crushed with puree  2. Muscle weakness (generalized)  Stable   3. Hypertensive heart disease with heart failure (HCC)  Stable   4. Gastroesophageal reflux disease without esophagitis  Stable   5. Hx of falling  Stable   6. Overactive bladder  Stable  Continue  Oxybutynin 5 mg po BID  7. Insomnia, unspecified type  Stable  Continue Mirtazapine 7.5 mg PO Q HS   8. Mood disorder (HCC)  Stable  Continue Mirtazapine 7.5 mg PO Q HS   9. Other specified anxiety disorders  Stable  Continue Alprazolam 0.25 mg po BID  10. Essential hypertension  Stable   11. Paroxysmal atrial fibrillation (HCC)  Stable  Continue Cardizem 60 mg po TID  Plavix 75 mg po Q Day  12. Allergic rhinitis, unspecified seasonality, unspecified trigger  Stable   13. Peripheral polyneuropathy  Stable   14. Chronic obstructive pulmonary disease, unspecified COPD type (HCC)  Stable   15. Pulmonary hypertension, moderate to severe (HCC)  Stable  OxyFast 20 mg/mL- 0.25-0.5 mL PO Q 2 hours prn- dyspnea, pain  Pepcid 40 mg po Q Day while on Oxycodone (h/o "allergy/intolerance")  16. Diabetes mellitus without complication (HCC)  Stable   17. Dementia without behavioral disturbance, unspecified dementia type  Stable   18. Osteoarthritis, unspecified osteoarthritis type, unspecified site  Stable  Continue Acetaminophen 650 mg po QID  19. Hyperlipidemia, unspecified hyperlipidemia type  Stable   20. Pacemaker  Stable  Followed by cardiology   21. Protein-calorie malnutrition, severe (HCC)  Stable  Continue Mirtazapine 7.5 mg PO Q HS   22. Toe infection  Wound RN- performed cross-hatching of eschar  Soak foot in very warm water with 1/4 cup Epsom salt Q Day. Soak until water is cool. Dry well  Apply TAO ointment and Bandaid  23. Urinary tract infection without hematuria, site unspecified  Cipro 250 mg po Q 12 hours x 10 doses  Family/ staff Communication:   Total Time:  Documentation:  Face to Face:  Family/Phone:   Labs/tests ordered:    Medication list reviewed and assessed for continued appropriateness. Monthly medication orders reviewed and signed.  Brynda Rim, NP-C [facility] [phone] [address], [place] [address] Cell Phone (Mon-Fri 8am-5pm):  [phone] On Call:  [phone] & follow prompts after 5pm & weekends Office Phone:  [phone] Office Fax:  [phone]

## 2017-08-09 LAB — URINE CULTURE

## 2017-08-11 ENCOUNTER — Encounter
Admission: RE | Admit: 2017-08-11 | Discharge: 2017-08-11 | Disposition: A | Discharge status: Home / Self Care | Payer: Medicare Other | Source: Ambulatory Visit | Attending: Internal Medicine | Admitting: Internal Medicine

## 2017-08-28 ENCOUNTER — Other Ambulatory Visit: Payer: Self-pay

## 2017-08-28 MED ORDER — ALPRAZOLAM 0.25 MG PO TABS: 0.2500 mg | ORAL_TABLET | Freq: Two times a day (BID) | ORAL | 3 refills | Status: DC

## 2017-08-28 NOTE — Telephone Encounter (Signed)
Rx sent to [facility] phone : [phone] , fax : [phone]  

## 2017-09-11 ENCOUNTER — Encounter: Payer: Self-pay | Admitting: Gerontology

## 2017-09-11 ENCOUNTER — Encounter
Admission: RE | Admit: 2017-09-11 | Discharge: 2017-09-11 | Disposition: A | Discharge status: Home / Self Care | Payer: Medicare Other | Source: Ambulatory Visit | Attending: Internal Medicine | Admitting: Internal Medicine

## 2017-09-11 ENCOUNTER — Non-Acute Institutional Stay (SKILLED_NURSING_FACILITY): Payer: Medicare Other | Admitting: Gerontology

## 2017-09-11 DIAGNOSIS — F418 Other specified anxiety disorders: Secondary | ICD-10-CM

## 2017-09-11 DIAGNOSIS — E119 Type 2 diabetes mellitus without complications: Secondary | ICD-10-CM

## 2017-09-11 DIAGNOSIS — J309 Allergic rhinitis, unspecified: Secondary | ICD-10-CM

## 2017-09-11 DIAGNOSIS — R1312 Dysphagia, oropharyngeal phase: Secondary | ICD-10-CM | POA: Diagnosis not present

## 2017-09-11 DIAGNOSIS — Z9181 History of falling: Secondary | ICD-10-CM | POA: Diagnosis not present

## 2017-09-11 DIAGNOSIS — E785 Hyperlipidemia, unspecified: Secondary | ICD-10-CM

## 2017-09-11 DIAGNOSIS — L089 Local infection of the skin and subcutaneous tissue, unspecified: Secondary | ICD-10-CM

## 2017-09-11 DIAGNOSIS — N3281 Overactive bladder: Secondary | ICD-10-CM | POA: Diagnosis not present

## 2017-09-11 DIAGNOSIS — F0390 Unspecified dementia without behavioral disturbance: Secondary | ICD-10-CM

## 2017-09-11 DIAGNOSIS — I480 Paroxysmal atrial fibrillation: Secondary | ICD-10-CM

## 2017-09-11 DIAGNOSIS — J449 Chronic obstructive pulmonary disease, unspecified: Secondary | ICD-10-CM

## 2017-09-11 DIAGNOSIS — G4700 Insomnia, unspecified: Secondary | ICD-10-CM

## 2017-09-11 DIAGNOSIS — I10 Essential (primary) hypertension: Secondary | ICD-10-CM

## 2017-09-11 DIAGNOSIS — I110 Hypertensive heart disease with heart failure: Secondary | ICD-10-CM | POA: Diagnosis not present

## 2017-09-11 DIAGNOSIS — I272 Pulmonary hypertension, unspecified: Secondary | ICD-10-CM

## 2017-09-11 DIAGNOSIS — M1990 Unspecified osteoarthritis, unspecified site: Secondary | ICD-10-CM

## 2017-09-11 DIAGNOSIS — G629 Polyneuropathy, unspecified: Secondary | ICD-10-CM

## 2017-09-11 DIAGNOSIS — Z950 Presence of cardiac pacemaker: Secondary | ICD-10-CM

## 2017-09-11 DIAGNOSIS — M6281 Muscle weakness (generalized): Secondary | ICD-10-CM | POA: Diagnosis not present

## 2017-09-11 DIAGNOSIS — F39 Unspecified mood [affective] disorder: Secondary | ICD-10-CM

## 2017-09-11 DIAGNOSIS — K219 Gastro-esophageal reflux disease without esophagitis: Secondary | ICD-10-CM

## 2017-09-11 DIAGNOSIS — E43 Unspecified severe protein-calorie malnutrition: Secondary | ICD-10-CM

## 2017-09-15 ENCOUNTER — Other Ambulatory Visit: Payer: Self-pay

## 2017-09-15 MED ORDER — OXYCODONE HCL 20 MG/ML PO CONC: ORAL | 0 refills | Status: DC

## 2017-09-15 NOTE — Telephone Encounter (Signed)
Rx sent to [facility] phone : [phone] , fax : [phone]  

## 2017-09-26 NOTE — Progress Notes (Signed)
Location:   [facility] Room Number: 325A Place of Service:  SNF [phone]) Provider:  [name], NP-C  Patient, [name]  Patient Care Team: Patient, [name] as PCP - General (General Practice)  Extended Emergency Contact Information Primary Emergency Contact: [name],[name]          Bruna Potter, [place] Phone: [phone] Relation: Daughter Secondary Emergency Contact: [name] Address: [address]          [place], [place] [address] [place] Home Phone: [phone] Mobile Phone: [phone] Relation: Daughter  Code Status:  DNR Goals of care: Advanced Directive information Advanced Directives 09/11/2017  Does Patient Have a Medical Advance Directive? Yes  Type of Advance Directive Out of facility DNR (pink MOST or yellow form)  Does patient want to make changes to medical advance directive? No - Patient declined  Copy of Healthcare Power of Attorney in Chart? -  Would patient like information on creating a medical advance directive? -  Pre-existing out of facility DNR order (yellow form or pink MOST form) -     Chief Complaint  Patient presents with  . Medical Management of Chronic Issues    Routine Visit    HPI:  Pt is a 81 y.o. female seen today for medical management of chronic diseases.  Patient is here.  She is still on hospice services.  Patient is now nonverbal.  Makes attempts to speak, but cannot be.  Speech garbled/mobile.  Requires assistance with feeding.  Incontinent of bowel and bladder.  Wears protective brace.  Occasionally tends to get up unassisted.  She has not had any falls this past month.  Patient to [facility] chair in [place] as frequently for safety monitoring.  Dressing covering irritated right third toe.  Dressing changes with Santyl after Epson salt soaks daily.  Podiatry consult pending.  Unable to obtain complete ROS due to dementia.  Vital signs stable.  Past  Medical History:  Diagnosis Date  . Anemia    "as a child" (08/05/2013)  . Anxiety   . Aphasia as late effect of stroke 04/18/2017  . Arthritis    "all over me; in all my joints" (08/05/2013)  . Atrial fibrillation (HCC)    a. Chronic; No longer on coumadin 2/2 frequent falls.  . Chest pain    a. 08/2011 Myoview: sm, fixed anteroseptal defect (attenuation), no ischemia, EF 62%.  . CHF (congestive heart failure) (HCC)   . Chronic atrial fibrillation (HCC) 04/18/2017  . Coccyx pain   . COPD (chronic obstructive pulmonary disease) (HCC)   . Dementia   . Dementia in Alzheimer's disease 04/18/2017  . Depression   . Diabetes mellitus type 2, controlled, without complications (HCC) 04/18/2017  . Falls frequently    coumadin stopped  . GERD (gastroesophageal reflux disease)   . Gout    "right foot" (08/05/2013)  . H/O hiatal hernia   . History of pneumonia    "couple times; long time ago" (08/05/2013)  . Hypercholesterolemia   . Hypertension   . Hypertrophic cardiomyopathy (HCC)    a. 11/2013 Echo: EF 55-60%, basal inf HK, sev dil LA, no evidence of HCM.  PASP .  [name] Hyponatremia   . Hyposmolality   . Osteoarthritis   . Oxygen dependent    "3L 24/7" (08/05/2013)  . Pacemaker    a. 04/2012 MDT [id number] [employer] PPM, ser #: [id number].  . Pulmonary HTN (HCC)  a. has had prior right heart cath in 2010; was felt that most likely due to elevated left sided pressures and would not benefit from vasodilator therapy;  b. 11/2013 Echo: PASP 60mmHg.  [name]. Simple chronic bronchitis (HCC) 04/18/2017  . Situational mixed anxiety and depressive disorder   . TIA (transient ischemic attack)   . Type II diabetes mellitus (HCC)   . Varicose veins    Past Surgical History:  Procedure Laterality Date  . ABDOMINAL HYSTERECTOMY     "partial" (08/05/2013)  . APPENDECTOMY    . BACK SURGERY     "bone spur removed; mid back" (08/05/2013)  . CARDIAC CATHETERIZATION     "more than once" (08/05/2013)  .  DILATION AND CURETTAGE OF UTERUS     "several" (08/05/2013)  . FOOT NEUROMA SURGERY Right   . INSERT / REPLACE / REMOVE PACEMAKER  1998; 2000's; 2014  . PACEMAKER GENERATOR CHANGE N/A 04/24/2012   Performed by [facility] Salvia[facility]lein, Steven C[facility]  . TONSILLECTOMY    . TOTAL KNEE ARTHROPLASTY Right 2011    Allergies  Allergen Reactions  . Aspirin Nausea And Vomiting  . Ativan [Lorazepam] Other (See Comments)    Hallucinations  . Atorvastatin     Other reaction(s): Unknown  . Calan [Verapamil Hcl] Other (See Comments)    Patient states it makes her "out of her mind"; bp bottoms out (takes diltiazem at home)  . Codeine Nausea And Vomiting  . Crestor [Rosuvastatin Calcium] Other (See Comments)    Muscle pain  . Escitalopram Oxalate Other (See Comments)    Reaction Unknown   . Lactose Intolerance (Gi) Diarrhea    Can tolerate in small amounts  . Lexapro [Escitalopram] Other (See Comments)    Unknown   . Lipitor [Atorvastatin Calcium] Other (See Comments)    myalgia  . Lopressor [Metoprolol Tartrate] Other (See Comments)    Blood pressure bottoms out   . Metoprolol Tartrate Other (See Comments)    Unknown   . Nitroglycerin Other (See Comments)    Reaction unknown  . Norvasc [Amlodipine Besylate] Other (See Comments)    fatigue  . Nsaids Nausea Only  . Oxycodone Other (See Comments)    Reaction unknown  . Percocet [Oxycodone-Acetaminophen] Nausea And Vomiting  . Prednisolone     Other reaction(s): Unknown  . Prednisone Itching and Swelling  . Rosuvastatin     Other reaction(s): Unknown  . Sulfa Antibiotics Nausea Only and Other (See Comments)    Makes her stomach hurt  . Tape Other (See Comments)    Skin tears & bruises easily (SKIN IS THIN!!)  . Verapamil     Other reaction(s): Unknown  . Vimovo [Naproxen-Esomeprazole] Itching, Nausea Only and Swelling  . Zoloft [Sertraline] Other (See Comments)    UNKNOWN   . Penicillins Hives and Rash    Has patient had a PCN  reaction causing immediate rash, facial/tongue/throat swelling, SOB or lightheadedness with hypotension: Yes Has patient had a PCN reaction causing severe rash involving mucus membranes or skin necrosis: No Has patient had a PCN reaction that required hospitalization No Has patient had a PCN reaction occurring within the last 10 years: No If all of the above answers are "NO", then may proceed with Cephalosporin use.     Allergies as of 09/11/2017      Reactions   Aspirin Nausea And Vomiting   Ativan [lorazepam] Other (See Comments)   Hallucinations   Atorvastatin    Other reaction(s): Unknown  Calan [verapamil Hcl] Other (See Comments)   Patient states it makes her "out of her mind"; bp bottoms out (takes diltiazem at home)   Codeine Nausea And Vomiting   Crestor [rosuvastatin Calcium] Other (See Comments)   Muscle pain   Escitalopram Oxalate Other (See Comments)   Reaction Unknown   Lactose Intolerance (gi) Diarrhea   Can tolerate in small amounts   Lexapro [escitalopram] Other (See Comments)   Unknown   Lipitor [atorvastatin Calcium] Other (See Comments)   myalgia   Lopressor [metoprolol Tartrate] Other (See Comments)   Blood pressure bottoms out   Metoprolol Tartrate Other (See Comments)   Unknown   Nitroglycerin Other (See Comments)   Reaction unknown   Norvasc [amlodipine Besylate] Other (See Comments)   fatigue   Nsaids Nausea Only   Oxycodone Other (See Comments)   Reaction unknown   Percocet [oxycodone-acetaminophen] Nausea And Vomiting   Prednisolone    Other reaction(s): Unknown   Prednisone Itching, Swelling   Rosuvastatin    Other reaction(s): Unknown   Sulfa Antibiotics Nausea Only, Other (See Comments)   Makes her stomach hurt   Tape Other (See Comments)   Skin tears & bruises easily (SKIN IS THIN!!)   Verapamil    Other reaction(s): Unknown   Vimovo [naproxen-esomeprazole] Itching, Nausea Only, Swelling   Zoloft [sertraline] Other (See Comments)    UNKNOWN   Penicillins Hives, Rash   Has patient had a PCN reaction causing immediate rash, facial/tongue/throat swelling, SOB or lightheadedness with hypotension: Yes Has patient had a PCN reaction causing severe rash involving mucus membranes or skin necrosis: No Has patient had a PCN reaction that required hospitalization No Has patient had a PCN reaction occurring within the last 10 years: No If all of the above answers are "NO", then may proceed with Cephalosporin use.      Medication List        Accurate as of 09/11/17 11:59 PM. Always use your most recent med list.          acetaminophen 325 MG tablet Commonly known as:  TYLENOL Take 650 mg by mouth 4 (four) times daily.   ALPRAZolam 0.25 MG tablet Commonly known as:  XANAX Take 1 tablet (0.25 mg total) by mouth 2 (two) times daily. At 9 am and 9 pm   alum & mag hydroxide-simeth 400-400-40 MG/5ML suspension Commonly known as:  MAALOX PLUS Take 30 mLs by mouth every 4 (four) hours as needed.   benzocaine 10 % mucosal gel Commonly known as:  ORAJEL Apply thin film to area of soreness on gums every 2 hours as needed   bisacodyl 10 MG suppository Commonly known as:  DULCOLAX Place 10 mg rectally daily as needed.   clopidogrel 75 MG tablet Commonly known as:  PLAVIX Take 1 tablet (75 mg total) by mouth daily.   collagenase ointment Commonly known as:  SANTYL Apply 1 application topically daily. Cleanse right 3rd toe with ns, apply santyl nickel thick to area daily.   diltiazem 60 MG tablet Commonly known as:  CARDIZEM Take 60 mg by mouth 3 (three) times daily.   divalproex 125 MG capsule Commonly known as:  DEPAKOTE SPRINKLE Take 125 mg by mouth 3 (three) times daily with meals.   famotidine 40 MG tablet Commonly known as:  PEPCID Take 40 mg by mouth daily. For use while on oxycodone   mirtazapine 7.5 MG tablet Commonly known as:  REMERON Take 7.5 mg by mouth at bedtime.   oxybutynin 5  MG tablet Commonly  known as:  DITROPAN Take 5 mg by mouth 2 (two) times daily.   oxyCODONE 20 MG/ML concentrated solution Commonly known as:  ROXICODONE INTENSOL Give 0.25 ml - 0.5 ml by mouth every 2 hours as needed for pain, dyspnea. Monitor for reaction. Per EPIC allergic reaction is GI upset.  0.25 - mild; 0.5 ml - moderate to severe   senna 8.6 MG Tabs tablet Commonly known as:  SENOKOT Take 1 tablet by mouth 2 (two) times daily.   UNABLE TO FIND Med Name: Epsom Salt crystals 100% : Use 1/4 cup of epsom salt with warm water daily. Soak until water cools, dry well and then do dressing.       Review of Systems  Unable to perform ROS: Dementia  Constitutional: Negative for activity change, appetite change and fever.  HENT: Negative for congestion, trouble swallowing and voice change.   Respiratory: Negative for apnea, cough, choking, shortness of breath and wheezing.   Cardiovascular: Negative for chest pain, palpitations and leg swelling.  Gastrointestinal: Negative for abdominal distention and abdominal pain.  Genitourinary: Negative.   Musculoskeletal: Positive for arthralgias (typical arthritis). Negative for back pain, gait problem and myalgias.  Skin: Negative for color change, pallor, rash and wound.  Neurological: Negative.   Psychiatric/Behavioral: Positive for confusion. Negative for agitation and behavioral problems.  All other systems reviewed and are negative.   Immunization History  Administered Date(s) Administered  . Influenza Split 09/11/2012  . Influenza Whole 10/15/2010  . Influenza, High Dose Seasonal PF 08/20/2017  . Influenza-Unspecified 08/10/2013, 02/06/2016, 08/27/2016  . PPD Test 08/24/2013, 02/06/2016, 02/13/2017  . Pneumococcal Polysaccharide-23 10/15/2010  . Pneumococcal-Unspecified 08/10/2013, 02/06/2016  . Tdap 10/01/2013   Pertinent  Health Maintenance Due  Topic Date Due  . FOOT EXAM  06/18/1946  . OPHTHALMOLOGY EXAM  06/18/1946  . URINE MICROALBUMIN   06/18/1946  . PNA vac Low Risk Adult (2 of 2 - PCV13) 02/05/2017  . HEMOGLOBIN A1C  03/17/2017  . INFLUENZA VACCINE  Completed  . DEXA SCAN  Completed   Fall Risk  05/18/2013  Falls in the past year? Yes  Number falls in past yr: 1  Injury with Fall? No  Risk for fall due to : Impaired balance/gait;Impaired mobility   Functional Status Survey:    Vitals:   09/11/17 1112  BP: 112/89  Pulse: 61  Resp: 20  Temp: 97.6 F (36.4 C)  SpO2: 99%  Weight: 104 lb 14.4 oz (47.6 kg)  Height: 5' (1.524 m)   Body mass index is 20.49 kg/m. Physical Exam  Constitutional: She is oriented to person, place, and time. Vital signs are normal. She appears well-developed and well-nourished. She is active and cooperative. She does not appear ill. No distress.  HENT:  Head: Normocephalic and atraumatic.  Mouth/Throat: Uvula is midline, oropharynx is clear and moist and mucous membranes are normal. Mucous membranes are not pale, not dry and not cyanotic.  Eyes: EOM and lids are normal.  Neck: Trachea normal, normal range of motion and full passive range of motion without pain. No JVD present. No tracheal deviation, no edema and no erythema present.  Cardiovascular: Normal rate, regular rhythm, intact distal pulses and normal pulses. Exam reveals no gallop, no distant heart sounds and no friction rub.  No murmur heard. Pulses:      Dorsalis pedis pulses are 2+ on the right side, and 2+ on the left side.  No edema  Pulmonary/Chest: Effort normal and breath sounds normal.  No accessory muscle usage. No respiratory distress. She has no decreased breath sounds. She has no wheezes. She has no rhonchi. She has no rales. She exhibits no tenderness.  Abdominal: Soft. Normal appearance and bowel sounds are normal. She exhibits no distension and no ascites. There is no tenderness. There is no CVA tenderness.  Musculoskeletal: She exhibits no edema.  Expected osteoarthritis, stiffness  Neurological: She is alert  and oriented to person, place, and time. She has normal strength.  Skin: Skin is warm and dry. Abrasion noted. No rash noted. She is not diaphoretic. No cyanosis or erythema. No pallor. Nails show no clubbing.  Right third toe with erythema and eschar  Psychiatric: Her speech is normal. Her affect is blunt. She is withdrawn. Cognition and memory are impaired. She expresses impulsivity and inappropriate judgment. She exhibits abnormal recent memory and abnormal remote memory.  Nursing note and vitals reviewed.   Labs reviewed: No results for input(s): NA, K, CL, CO2, GLUCOSE, BUN, CREATININE, CALCIUM, MG, PHOS in the last 8760 hours. No results for input(s): AST, ALT, ALKPHOS, BILITOT, PROT, ALBUMIN in the last 8760 hours. No results for input(s): WBC, NEUTROABS, HGB, HCT, MCV, PLT in the last 8760 hours. Lab Results  Component Value Date   TSH 1.674 02/07/2015   Lab Results  Component Value Date   HGBA1C 5.6 09/17/2016   Lab Results  Component Value Date   CHOL 288 (H) 09/17/2016   HDL 43 09/17/2016   LDLCALC 207 (H) 09/17/2016   TRIG 190 (H) 09/17/2016   CHOLHDL 6.7 09/17/2016    Significant Diagnostic Results in last 30 days:  No results found.  Assessment/Plan 1. Dysphagia, oropharyngeal phase  Diet: mechanical soft  Nectar thick liquids  Medications crushed with puree  2. Muscle weakness (generalized)  Stable   3. Hypertensive heart disease with heart failure (HCC)  Stable   4. Gastroesophageal reflux disease without esophagitis  Stable   5. Hx of falling  Stable   6. Overactive bladder  Stable  Continue Oxybutynin 5 mg po BID  7. Insomnia, unspecified type  Stable  Continue Mirtazapine 7.5 mg PO Q HS   8. Mood disorder (HCC)  Stable  Continue Mirtazapine 7.5 mg PO Q HS   9. Other specified anxiety disorders  Stable  Continue Alprazolam 0.25 mg po BID  10. Essential hypertension  Stable   11. Paroxysmal atrial fibrillation  (HCC)  Stable  Continue Cardizem 60 mg po TID  Continue Plavix 75 mg po Q Day  12. Allergic rhinitis, unspecified seasonality, unspecified trigger  Stable   13. Peripheral polyneuropathy  Stable   14. Chronic obstructive pulmonary disease, unspecified COPD type (HCC)  Stable   15. Pulmonary hypertension, moderate to severe (HCC)  Stable  Continue OxyFast 20 mg/mL- 0.25-0.5 mL PO Q 2 hours prn- dyspnea, pain  Continue Pepcid 40 mg po Q Day while on Oxycodone (h/o "allergy/intolerance")  16. Diabetes mellitus without complication (HCC)  Stable   17. Dementia without behavioral disturbance, unspecified dementia type  Stable   18. Osteoarthritis, unspecified osteoarthritis type, unspecified site  Stable  Continue Acetaminophen 650 mg po QID  19. Hyperlipidemia, unspecified hyperlipidemia type  Stable   20. Pacemaker  Stable  Followed by cardiology   21. Protein-calorie malnutrition, severe (HCC)  Stable  Continue Mirtazapine 7.5 mg PO Q HS   22. Toe infection  Soak foot in very warm water with 1/4 cup Epsom salt Q Day. Soak until water is cool. Dry well  Apply  TAO ointment and Bandaid  Podiatry appointment pending  Continue all medications as listed above  Family/ staff Communication:   Total Time:  Documentation:  Face to Face:  Family/Phone:   Labs/tests ordered:  Not due, on Hospice services  Medication list reviewed and assessed for continued appropriateness. Monthly medication orders reviewed and signed.  Brynda Rim, NP-C Geriatrics [facility] [phone], [place] [address] Cell Phone (Mon-Fri 8am-5pm):  [phone] On Call:  [phone] & follow prompts after 5pm & weekends Office Phone:  [phone] Office Fax:  [phone]

## 2017-09-29 ENCOUNTER — Other Ambulatory Visit: Payer: Self-pay

## 2017-09-29 MED ORDER — OXYCODONE HCL 20 MG/ML PO CONC: 5.0000 mg | Freq: Four times a day (QID) | ORAL | 0 refills | Status: DC

## 2017-09-29 MED ORDER — OXYCODONE HCL 20 MG/ML PO CONC: ORAL | 0 refills | Status: DC

## 2017-09-29 NOTE — Telephone Encounter (Signed)
Rx sent to [facility] phone : [phone] , fax : [phone]  

## 2017-10-01 ENCOUNTER — Other Ambulatory Visit: Payer: Self-pay

## 2017-10-01 MED ORDER — OXYCODONE HCL 20 MG/ML PO CONC: 5.0000 mg | Freq: Four times a day (QID) | ORAL | 0 refills | Status: DC

## 2017-10-01 MED ORDER — OXYCODONE HCL 20 MG/ML PO CONC: ORAL | 0 refills | Status: DC

## 2017-10-01 NOTE — Telephone Encounter (Signed)
Rx sent to [facility] phone : [phone] , fax : [phone]  

## 2017-10-11 ENCOUNTER — Encounter
Admission: RE | Admit: 2017-10-11 | Discharge: 2017-10-11 | Disposition: A | Discharge status: Home / Self Care | Payer: Medicare Other | Source: Ambulatory Visit | Attending: Internal Medicine | Admitting: Internal Medicine

## 2017-10-13 ENCOUNTER — Encounter: Payer: Self-pay | Admitting: Gerontology

## 2017-10-13 ENCOUNTER — Non-Acute Institutional Stay (SKILLED_NURSING_FACILITY): Payer: Medicare Other | Admitting: Gerontology

## 2017-10-13 DIAGNOSIS — G459 Transient cerebral ischemic attack, unspecified: Secondary | ICD-10-CM

## 2017-10-13 DIAGNOSIS — I272 Pulmonary hypertension, unspecified: Secondary | ICD-10-CM

## 2017-10-13 DIAGNOSIS — I340 Nonrheumatic mitral (valve) insufficiency: Secondary | ICD-10-CM

## 2017-10-23 ENCOUNTER — Non-Acute Institutional Stay (SKILLED_NURSING_FACILITY): Payer: Medicare Other | Admitting: Gerontology

## 2017-10-23 ENCOUNTER — Encounter: Payer: Self-pay | Admitting: Gerontology

## 2017-10-23 DIAGNOSIS — I739 Peripheral vascular disease, unspecified: Secondary | ICD-10-CM

## 2017-10-23 NOTE — Progress Notes (Signed)
Location:   [facility] Room Number: 325A Place of Service:  SNF [phone]) Provider:  Lorenso QuarryShannon Dymir Neeson, NP-C  Patient, [name]  Patient Care Team: Patient, [name] as PCP - General (General Practice)  Extended Emergency Contact Information Primary Emergency Contact: [name],[name]          Bruna Potter[place], [place] Mobile Phone: [phone] Relation: Daughter Secondary Emergency Contact: Greer EeBrown,Karen Address: [address]          [place], [place] [address] [place] Home Phone: [phone] Mobile Phone: [phone] Relation: Daughter  Code Status:  DNR Goals of care: Advanced Directive information Advanced Directives 10/23/2017  Does Patient Have a Medical Advance Directive? Yes  Type of Advance Directive Out of facility DNR (pink MOST or yellow form)  Does patient want to make changes to medical advance directive? No - Patient declined  Copy of Healthcare Power of Attorney in Chart? -  Would patient like information on creating a medical advance directive? -  Pre-existing out of facility DNR order (yellow form or pink MOST form) -     Chief Complaint  Patient presents with  . Acute Visit    Check patient's toe    HPI:  Pt is a 81 y.o. female seen today for an acute visit for worsening redness of right 3rd toe. Pt with h/o PVD and DM. Toe with necrosis. Now with maceration/ serous drainage from nail bed. Faint pulse in the foot. Skin cool to touch. Periwound tissue with some mild redness. Faint odor. Painful to touch. Pt on Hospice services and is not a surgical candidate d/t co-morbidities per podiatry. 2x2 gauze pads between toes, covered with 4x4 for protection and wrapped with kerlix. Wound assessed with Angus Palmsosemary Clark, RN, WOC.  Afebrile. VSS.   Please not pt with limited verbal ability. Unable to obtain complete ROS. Some ROS info obtained from staff and documentation.    Past Medical History:  Diagnosis Date    . Anemia    "as a child" (08/05/2013)  . Anxiety   . Aphasia as late effect of stroke 04/18/2017  . Arthritis    "all over me; in all my joints" (08/05/2013)  . Atrial fibrillation (HCC)    a. Chronic; No longer on coumadin 2/2 frequent falls.  . Chest pain    a. 08/2011 Myoview: sm, fixed anteroseptal defect (attenuation), no ischemia, EF 62%.  . CHF (congestive heart failure) (HCC)   . Chronic atrial fibrillation (HCC) 04/18/2017  . Coccyx pain   . COPD (chronic obstructive pulmonary disease) (HCC)   . Dementia   . Dementia in Alzheimer's disease 04/18/2017  . Depression   . Diabetes mellitus type 2, controlled, without complications (HCC) 04/18/2017  . Falls frequently    coumadin stopped  . GERD (gastroesophageal reflux disease)   . Gout    "right foot" (08/05/2013)  . H/O hiatal hernia   . History of pneumonia    "couple times; long time ago" (08/05/2013)  . Hypercholesterolemia   . Hypertension   . Hypertrophic cardiomyopathy (HCC)    a. 11/2013 Echo: EF 55-60%, basal inf HK, sev dil LA, no evidence of HCM.  PASP 60mmHg.  [name]. Hyponatremia   . Hyposmolality   . Osteoarthritis   . Oxygen dependent    "3L 24/7" (08/05/2013)  . Pacemaker    a. 04/2012 MDT [id number] [employer] DC PPM, ser #: [id number] H.  . Pulmonary HTN (HCC)    a. has had  prior right heart cath in 2010; was felt that most likely due to elevated left sided pressures and would not benefit from vasodilator therapy;  b. 11/2013 Echo: PASP .  [name] Simple chronic bronchitis (HCC) 04/18/2017  . Situational mixed anxiety and depressive disorder   . TIA (transient ischemic attack)   . Type II diabetes mellitus (HCC)   . Varicose veins    Past Surgical History:  Procedure Laterality Date  . ABDOMINAL HYSTERECTOMY     "partial" (08/05/2013)  . APPENDECTOMY    . BACK SURGERY     "bone spur removed; mid back" (08/05/2013)  . CARDIAC CATHETERIZATION     "more than once" (08/05/2013)  . DILATION AND CURETTAGE OF UTERUS      "several" (08/05/2013)  . FOOT NEUROMA SURGERY Right   . INSERT / REPLACE / REMOVE PACEMAKER  1998; 2000's; 2014  . PACEMAKER GENERATOR CHANGE N/A 04/24/2012   Procedure: PACEMAKER GENERATOR CHANGE;  Surgeon: Duke Salvia, MD;  Location: [facility] CATH LAB;  Service: Cardiovascular;  Laterality: N/A;  . TONSILLECTOMY    . TOTAL KNEE ARTHROPLASTY Right 2011    Allergies  Allergen Reactions  . Aspirin Nausea And Vomiting  . Ativan [Lorazepam] Other (See Comments)    Hallucinations  . Atorvastatin     Other reaction(s): Unknown  . Calan [Verapamil Hcl] Other (See Comments)    Patient states it makes her "out of her mind"; bp bottoms out (takes diltiazem at home)  . Codeine Nausea And Vomiting  . Crestor [Rosuvastatin Calcium] Other (See Comments)    Muscle pain  . Escitalopram Oxalate Other (See Comments)    Reaction Unknown   . Lactose Intolerance (Gi) Diarrhea    Can tolerate in small amounts  . Lexapro [Escitalopram] Other (See Comments)    Unknown   . Lipitor [Atorvastatin Calcium] Other (See Comments)    myalgia  . Lopressor [Metoprolol Tartrate] Other (See Comments)    Blood pressure bottoms out   . Metoprolol Tartrate Other (See Comments)    Unknown   . Nitroglycerin Other (See Comments)    Reaction unknown  . Norvasc [Amlodipine Besylate] Other (See Comments)    fatigue  . Nsaids Nausea Only  . Oxycodone Other (See Comments)    Reaction unknown  . Percocet [Oxycodone-Acetaminophen] Nausea And Vomiting  . Prednisolone     Other reaction(s): Unknown  . Prednisone Itching and Swelling  . Rosuvastatin     Other reaction(s): Unknown  . Sulfa Antibiotics Nausea Only and Other (See Comments)    Makes her stomach hurt  . Tape Other (See Comments)    Skin tears & bruises easily (SKIN IS THIN!!)  . Verapamil     Other reaction(s): Unknown  . Vimovo [Naproxen-Esomeprazole] Itching, Nausea Only and Swelling  . Zoloft [Sertraline] Other (See Comments)    UNKNOWN   .  Penicillins Hives and Rash    Has patient had a PCN reaction causing immediate rash, facial/tongue/throat swelling, SOB or lightheadedness with hypotension: Yes Has patient had a PCN reaction causing severe rash involving mucus membranes or skin necrosis: No Has patient had a PCN reaction that required hospitalization No Has patient had a PCN reaction occurring within the last 10 years: No If all of the above answers are "NO", then may proceed with Cephalosporin use.     Allergies as of 10/23/2017      Reactions   Aspirin Nausea And Vomiting   Ativan [lorazepam] Other (See Comments)   Hallucinations   Atorvastatin  Other reaction(s): Unknown   Calan [verapamil Hcl] Other (See Comments)   Patient states it makes her "out of her mind"; bp bottoms out (takes diltiazem at home)   Codeine Nausea And Vomiting   Crestor [rosuvastatin Calcium] Other (See Comments)   Muscle pain   Escitalopram Oxalate Other (See Comments)   Reaction Unknown   Lactose Intolerance (gi) Diarrhea   Can tolerate in small amounts   Lexapro [escitalopram] Other (See Comments)   Unknown   Lipitor [atorvastatin Calcium] Other (See Comments)   myalgia   Lopressor [metoprolol Tartrate] Other (See Comments)   Blood pressure bottoms out   Metoprolol Tartrate Other (See Comments)   Unknown   Nitroglycerin Other (See Comments)   Reaction unknown   Norvasc [amlodipine Besylate] Other (See Comments)   fatigue   Nsaids Nausea Only   Oxycodone Other (See Comments)   Reaction unknown   Percocet [oxycodone-acetaminophen] Nausea And Vomiting   Prednisolone    Other reaction(s): Unknown   Prednisone Itching, Swelling   Rosuvastatin    Other reaction(s): Unknown   Sulfa Antibiotics Nausea Only, Other (See Comments)   Makes her stomach hurt   Tape Other (See Comments)   Skin tears & bruises easily (SKIN IS THIN!!)   Verapamil    Other reaction(s): Unknown   Vimovo [naproxen-esomeprazole] Itching, Nausea Only,  Swelling   Zoloft [sertraline] Other (See Comments)   UNKNOWN   Penicillins Hives, Rash   Has patient had a PCN reaction causing immediate rash, facial/tongue/throat swelling, SOB or lightheadedness with hypotension: Yes Has patient had a PCN reaction causing severe rash involving mucus membranes or skin necrosis: No Has patient had a PCN reaction that required hospitalization No Has patient had a PCN reaction occurring within the last 10 years: No If all of the above answers are "NO", then may proceed with Cephalosporin use.      Medication List        Accurate as of 10/23/17  4:54 PM. Always use your most recent med list.          acetaminophen 325 MG tablet Commonly known as:  TYLENOL Take 650 mg by mouth 4 (four) times daily.   ALPRAZolam 0.25 MG tablet Commonly known as:  XANAX Take 1 tablet (0.25 mg total) by mouth 2 (two) times daily. At 9 am and 9 pm   alum & mag hydroxide-simeth 400-400-40 MG/5ML suspension Commonly known as:  MAALOX PLUS Take 30 mLs by mouth every 4 (four) hours as needed.   benzocaine 10 % mucosal gel Commonly known as:  ORAJEL Apply thin film to area of soreness on gums every 2 hours as needed   bisacodyl 10 MG suppository Commonly known as:  DULCOLAX Place 10 mg rectally daily as needed.   clopidogrel 75 MG tablet Commonly known as:  PLAVIX Take 1 tablet (75 mg total) by mouth daily.   divalproex 125 MG capsule Commonly known as:  DEPAKOTE SPRINKLE Take 125 mg by mouth 3 (three) times daily with meals.   famotidine 40 MG tablet Commonly known as:  PEPCID Take 40 mg by mouth daily. For use while on oxycodone   mirtazapine 7.5 MG tablet Commonly known as:  REMERON Take 7.5 mg by mouth at bedtime.   oxybutynin 5 MG tablet Commonly known as:  DITROPAN Take 5 mg by mouth 2 (two) times daily.   oxyCODONE 20 MG/ML concentrated solution Commonly known as:  ROXICODONE INTENSOL Take 10 mg by mouth 4 (four) times daily. For pain,  dyspnea. Ok to give prn doses in addition to scheduled for severe pain. Monitor reaction. Per Epic, allergic reaction is GI upset.   oxyCODONE 20 MG/ML concentrated solution Commonly known as:  ROXICODONE INTENSOL Give 0.25 ml - 0.5 ml by mouth every 2 hours as needed for pain, dyspnea. Monitor for reaction. Per EPIC allergic reaction is GI upset.  0.25 - mild; 0.5 ml - moderate to severe   senna 8.6 MG Tabs tablet Commonly known as:  SENOKOT Take 1 tablet by mouth 2 (two) times daily.   UNABLE TO FIND Med Name: Epsom Salt crystals 100% : Use 1/4 cup of epsom salt with warm water daily. Soak until water cools, dry well and then do dressing.       Review of Systems  Unable to perform ROS: Patient nonverbal  Constitutional: Negative for activity change, appetite change, chills, diaphoresis and fever.  HENT: Negative for congestion, mouth sores, nosebleeds, postnasal drip, sneezing, sore throat, trouble swallowing and voice change.   Respiratory: Negative for apnea, cough, choking, chest tightness, shortness of breath and wheezing.   Cardiovascular: Negative for chest pain, palpitations and leg swelling.  Gastrointestinal: Negative for abdominal distention, abdominal pain, constipation, diarrhea and nausea.  Genitourinary: Negative for difficulty urinating, dysuria, frequency and urgency.  Musculoskeletal: Positive for arthralgias (typical arthritis). Negative for back pain, gait problem and myalgias.  Skin: Positive for wound. Negative for color change, pallor and rash.  Neurological: Positive for speech difficulty and weakness. Negative for dizziness, tremors, syncope, numbness and headaches.  Psychiatric/Behavioral: Positive for confusion. Negative for agitation and behavioral problems.  All other systems reviewed and are negative.   Immunization History  Administered Date(s) Administered  . Influenza Split 09/11/2012  . Influenza Whole 10/15/2010  . Influenza, High Dose Seasonal  PF 08/20/2017  . Influenza-Unspecified 08/10/2013, 02/06/2016, 08/27/2016  . PPD Test 08/24/2013, 02/06/2016, 02/13/2017  . Pneumococcal Polysaccharide-23 10/15/2010  . Pneumococcal-Unspecified 08/10/2013, 02/06/2016  . Tdap 10/01/2013   Pertinent  Health Maintenance Due  Topic Date Due  . FOOT EXAM  06/18/1946  . OPHTHALMOLOGY EXAM  06/18/1946  . URINE MICROALBUMIN  06/18/1946  . PNA vac Low Risk Adult (2 of 2 - PCV13) 02/05/2017  . HEMOGLOBIN A1C  03/17/2017  . INFLUENZA VACCINE  Completed  . DEXA SCAN  Completed   Fall Risk  05/18/2013  Falls in the past year? Yes  Number falls in past yr: 1  Injury with Fall? No  Risk for fall due to : Impaired balance/gait;Impaired mobility   Functional Status Survey:    Vitals:   10/23/17 1648  BP: (!) 127/54  Pulse: 62  Resp: 20  Temp: 97.8 F (36.6 C)  TempSrc: Oral  SpO2: 95%  Weight: 106 lb 12.8 oz (48.4 kg)  Height: 5' (1.524 m)   Body mass index is 20.86 kg/m. Physical Exam  Constitutional: Vital signs are normal. She appears well-developed and well-nourished.  Cardiovascular: Normal rate and regular rhythm.  Pulses:      Dorsalis pedis pulses are 1+ on the right side, and 2+ on the left side.  PVD, DM  Pulmonary/Chest: Effort normal and breath sounds normal.  Neurological: She is alert. She is disoriented. She displays atrophy. A cranial nerve deficit and sensory deficit is present. She exhibits abnormal muscle tone. Coordination and gait abnormal.  Skin:     Psychiatric: Her affect is blunt. Cognition and memory are impaired. She exhibits abnormal recent memory and abnormal remote memory.  Nursing note and vitals reviewed.  Labs reviewed: No results for input(s): NA, K, CL, CO2, GLUCOSE, BUN, CREATININE, CALCIUM, MG, PHOS in the last 8760 hours. No results for input(s): AST, ALT, ALKPHOS, BILITOT, PROT, ALBUMIN in the last 8760 hours. No results for input(s): WBC, NEUTROABS, HGB, HCT, MCV, PLT in the last 8760  hours. Lab Results  Component Value Date   TSH 1.674 02/07/2015   Lab Results  Component Value Date   HGBA1C 5.6 09/17/2016   Lab Results  Component Value Date   CHOL 288 (H) 09/17/2016   HDL 43 09/17/2016   LDLCALC 207 (H) 09/17/2016   TRIG 190 (H) 09/17/2016   CHOLHDL 6.7 09/17/2016    Significant Diagnostic Results in last 30 days:  No results found.  Assessment/Plan PVD (Peripheral vascular disease) (HCC)  Continue daily wrapping with 4x4s and kerlix for protection  Paint toe with betadine swab daily x 1 month to decrease Bioburden  Pain management/control per previous orders  Family/ staff Communication:   Total Time:  Documentation:  Face to Face:  Family/Phone:   Labs/tests ordered:    Medication list reviewed and assessed for continued appropriateness.  Brynda Rim, NP-C Geriatrics [facility] [phone] [address], [place] [address] Cell Phone (Mon-Fri 8am-5pm):  [phone] On Call:  [phone] & follow prompts after 5pm & weekends Office Phone:  [phone] Office Fax:  [phone]

## 2017-10-27 ENCOUNTER — Other Ambulatory Visit: Payer: Self-pay

## 2017-10-27 MED ORDER — OXYCODONE HCL 20 MG/ML PO CONC: 10.0000 mg | Freq: Four times a day (QID) | ORAL | 0 refills | Status: DC

## 2017-10-27 MED ORDER — OXYCODONE HCL 20 MG/ML PO CONC: ORAL | 0 refills | Status: DC

## 2017-10-27 NOTE — Telephone Encounter (Signed)
Rx sent to [facility] phone : [phone] , fax : [phone]  

## 2017-11-07 ENCOUNTER — Other Ambulatory Visit: Payer: Self-pay

## 2017-11-07 MED ORDER — OXYCODONE HCL 20 MG/ML PO CONC: 10.0000 mg | Freq: Four times a day (QID) | ORAL | 0 refills | Status: DC

## 2017-11-07 MED ORDER — OXYCODONE HCL 20 MG/ML PO CONC: ORAL | 0 refills | Status: DC

## 2017-11-07 NOTE — Telephone Encounter (Signed)
Rx sent to [facility] phone : [phone] , fax : [phone]  

## 2017-11-11 ENCOUNTER — Encounter
Admission: RE | Admit: 2017-11-11 | Discharge: 2017-11-11 | Disposition: A | Discharge status: Home / Self Care | Payer: Medicare Other | Source: Ambulatory Visit | Attending: Internal Medicine | Admitting: Internal Medicine

## 2017-11-12 ENCOUNTER — Encounter: Payer: Self-pay | Admitting: Gerontology

## 2017-11-12 ENCOUNTER — Non-Acute Institutional Stay (SKILLED_NURSING_FACILITY): Payer: Medicare Other | Admitting: Gerontology

## 2017-11-12 DIAGNOSIS — I739 Peripheral vascular disease, unspecified: Secondary | ICD-10-CM

## 2017-11-12 DIAGNOSIS — I422 Other hypertrophic cardiomyopathy: Secondary | ICD-10-CM

## 2017-11-12 DIAGNOSIS — I110 Hypertensive heart disease with heart failure: Secondary | ICD-10-CM

## 2017-11-13 IMAGING — CT CT HEAD W/O CM
3 of 4 series · 15 of 47 positions shown, 18 images · non-contrast
Comparison: Prior CT from 02/03/2016.

CLINICAL DATA: Initial evaluation for acute trauma, fall.

EXAM:
CT HEAD WITHOUT CONTRAST
TECHNIQUE: Contiguous axial images were obtained from the base of the skull
through the vertex without intravenous contrast.

[Series 2: head wo · axial · 0.41mm/px · z∈[-56,+74]mm · 9 of 32 slices shown, 12 images]
[im 3/32  brain]
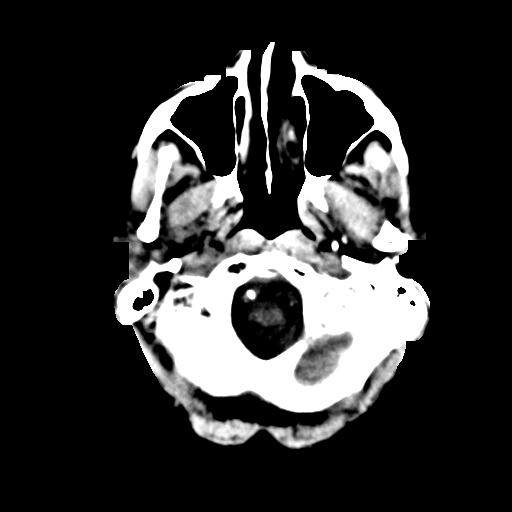
[im 3/32  bone]
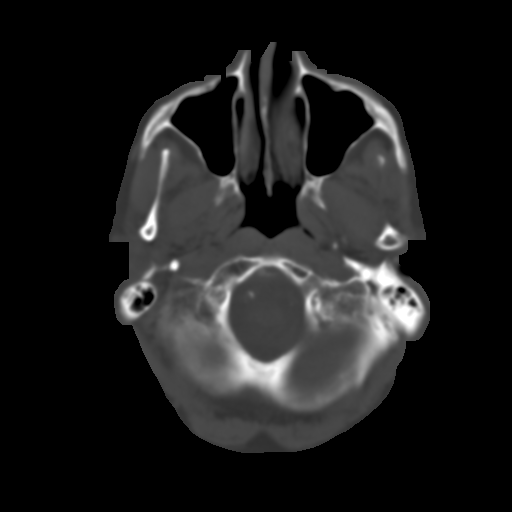
[im 7/32  brain]
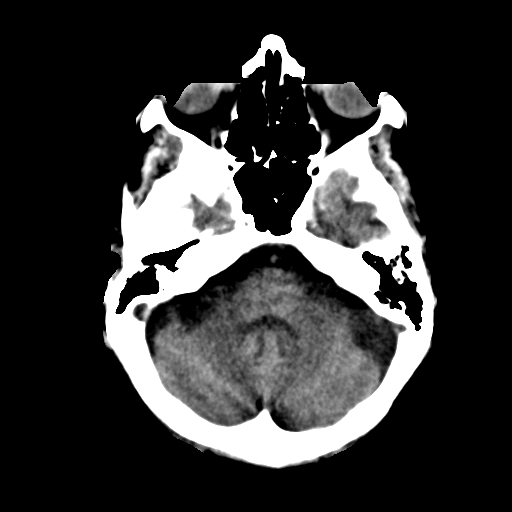
[im 9/32  brain]
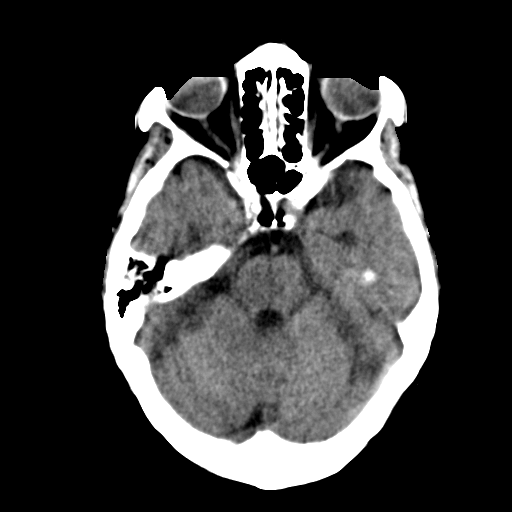
[im 14/32  brain]
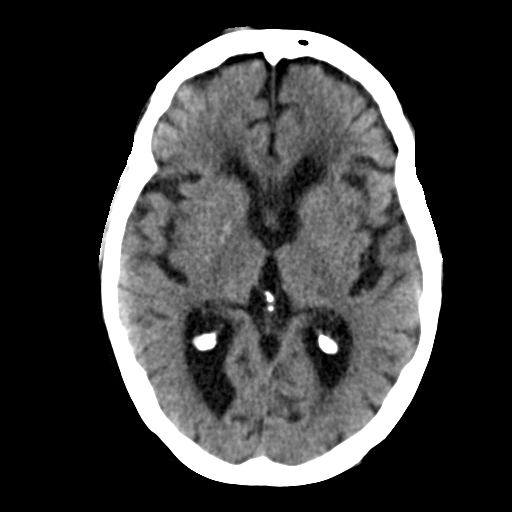
[im 16/32  brain]
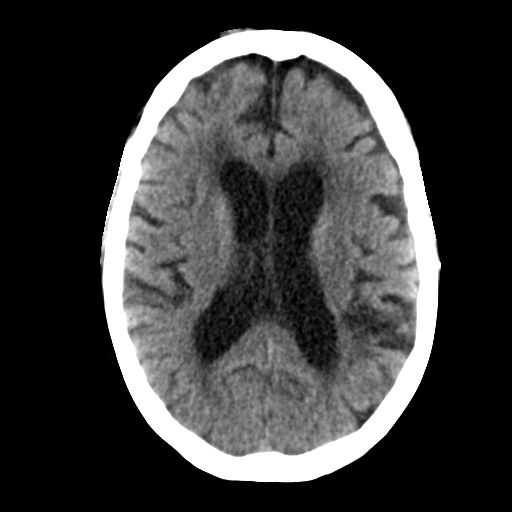
[im 16/32  bone]
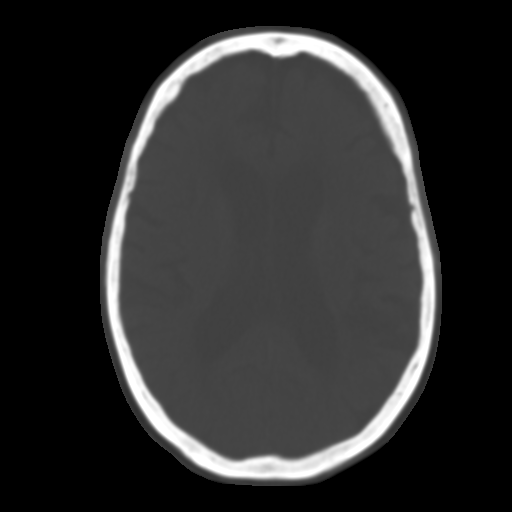
[im 18/32  brain]
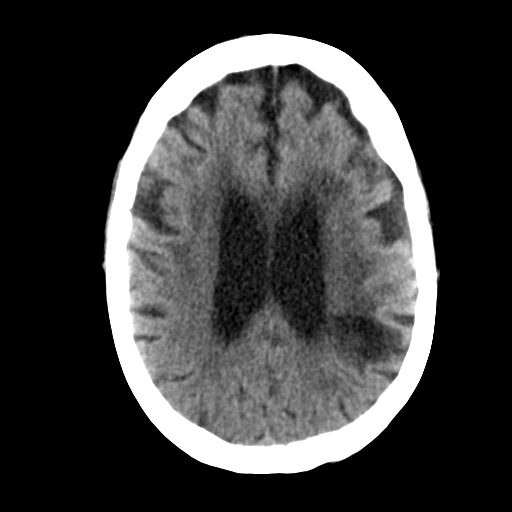
[im 23/32  brain]
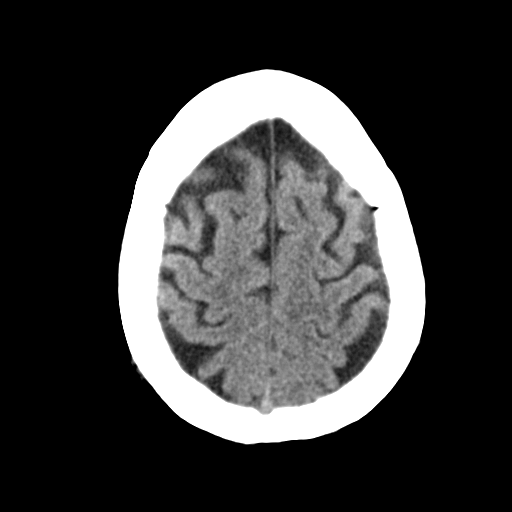
[im 25/32  brain]
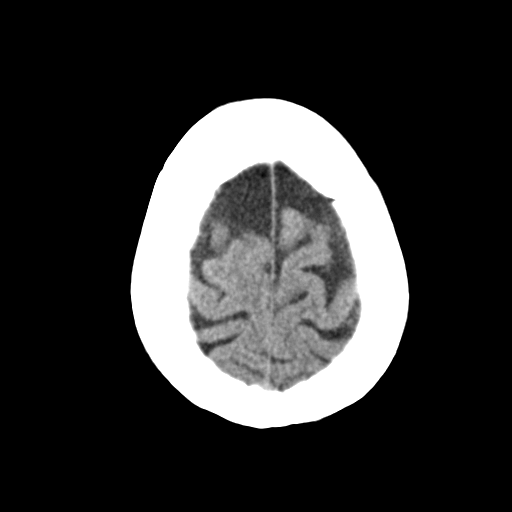
[im 29/32  brain]
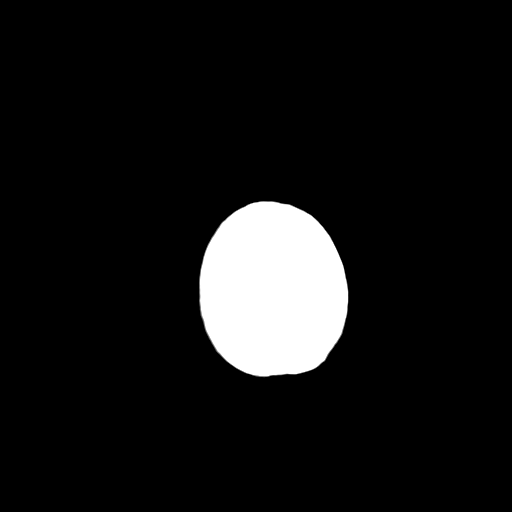
[im 29/32  bone]
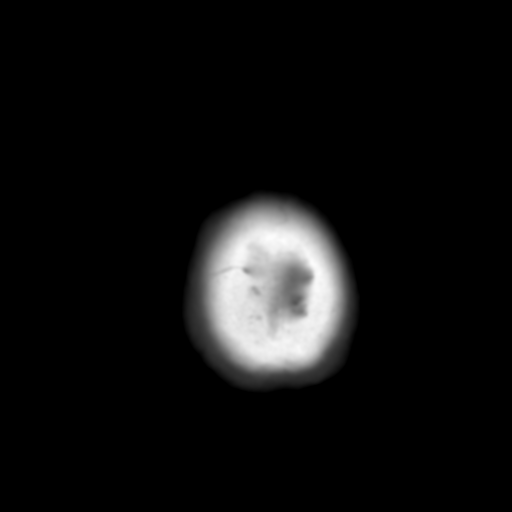

[Series 4: coronal soft tissue · coronal · 0.30mm/px · 3 of 64 slices shown]
[im 22/64  brain]
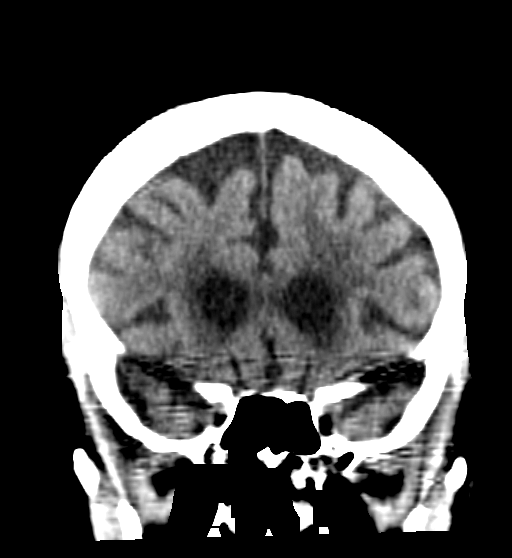
[im 29/64  brain]
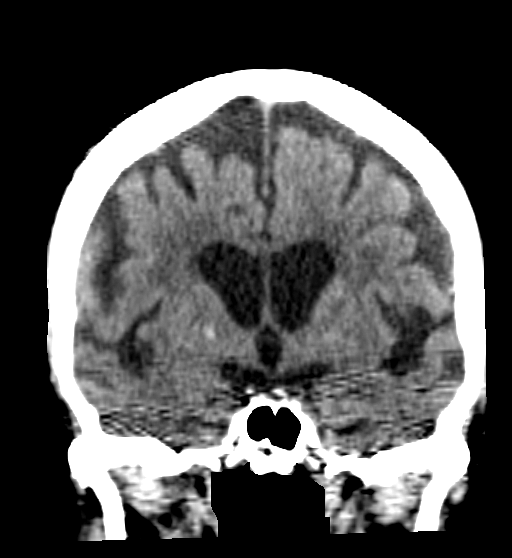
[im 36/64  brain]
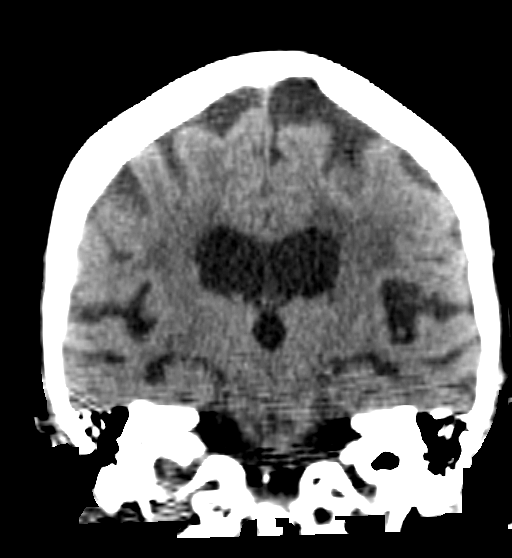

[Series 5: sagittal soft tissue · sagittal · 0.31mm/px · 3 of 47 slices shown]
[im 16/47  brain]
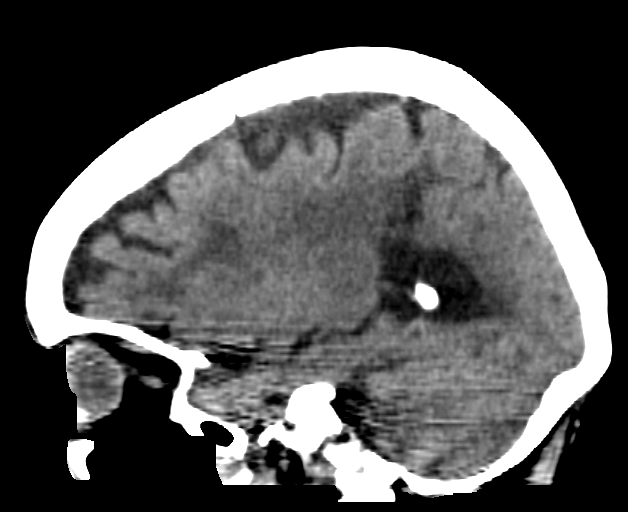
[im 24/47  brain]
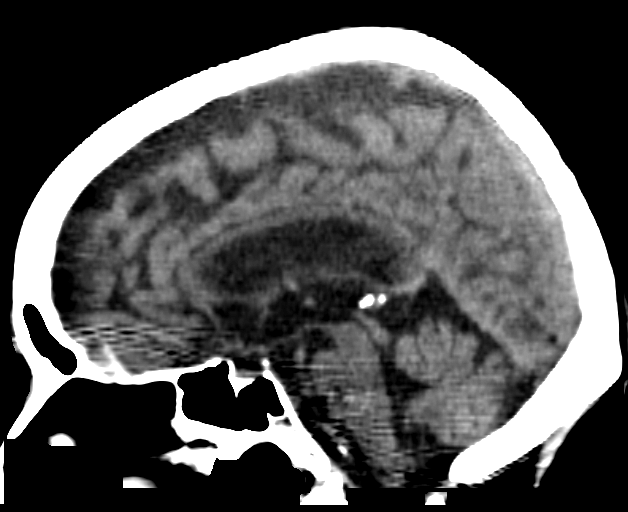
[im 31/47  brain]
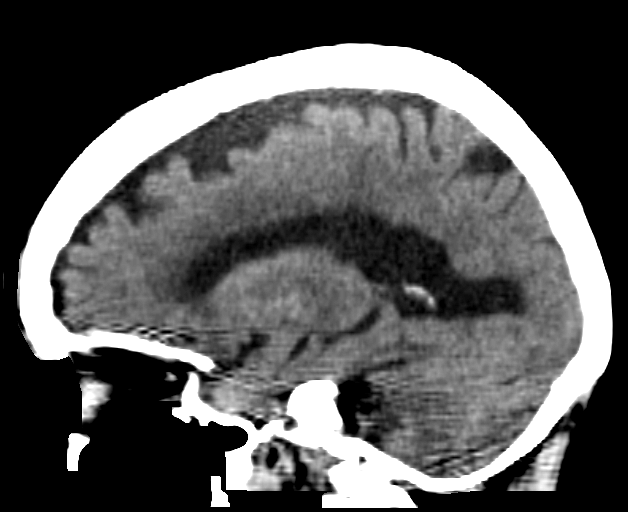

[15 of 47 positions shown; findings below may reference images not displayed]

FINDINGS: Small scalp contusion at the right forehead. Scalp soft tissues
otherwise unremarkable. No acute abnormality about the globes and
orbits.

Mild opacity within the right frontoethmoidal recess. Paranasal
sinuses are otherwise clear. Trace opacity left mastoid air cells.
Right mastoid air cells clear.

Calvarium intact.

Generalized cerebral atrophy with chronic small vessel ischemic
disease. Previously identified subacute posterior left MCA infarct
has evolved, now appears chronic in nature with associated
encephalomalacia. Small remote right cerebellar infarct also noted.
Intracranial atherosclerosis noted.

No acute intracranial hemorrhage. No acute large vessel territory
infarct. No mass lesion, midline shift, or mass effect. Ventricular
prominence related to global parenchymal volume loss without
hydrocephalus. No extra-axial fluid collection.
IMPRESSION: 1. No acute intracranial process.
2. Small right forehead scalp contusion.
3. Remote left MCA and right cerebellar infarcts.
4. Atrophy with chronic small vessel ischemic disease.

## 2017-11-28 NOTE — Assessment & Plan Note (Signed)
Stable.  No recent episodes of hypoxia or dyspnea.  O2 sats been stable

## 2017-11-28 NOTE — Assessment & Plan Note (Signed)
Stable.  No complaints of dyspnea, no edema, denies chest pain

## 2017-11-28 NOTE — Assessment & Plan Note (Signed)
No further episodes of having TIA symptoms as of late.

## 2017-11-28 NOTE — Progress Notes (Signed)
Location:    Nursing Home Room Number: 325A Place of Service:  SNF (31) Provider:  Lorenso QuarryShannon Jnya Brossard, NP-C  Patient, [name]  Patient Care Team: Patient, [name] as PCP - General (General Practice)  Extended Emergency Contact Information Primary Emergency Contact: [name],[name]          [place]runa [place], [place] Mobile Phone: [phone] Relation: Daughter Secondary Emergency Contact: Greer EeBrown,Karen Address: [address]          [place], [place] [id number] [place] Home Phone: [phone] Mobile Phone: [phone] Relation: Daughter  Code Status:  DNR Goals of care: Advanced Directive information Advanced Directives 11/12/2017  Does Patient Have a Medical Advance Directive? Yes  Type of Advance Directive Out of facility DNR (pink MOST or yellow form)  Does patient want to make changes to medical advance directive? No - Patient declined  Copy of Healthcare Power of Attorney in Chart? -  Would patient like information on creating a medical advance directive? -  Pre-existing out of facility DNR order (yellow form or pink MOST form) -     Chief Complaint  Patient presents with  . Medical Management of Chronic Issues    Routine Visit    HPI:  Pt is a 82 y.o. female seen today for medical management of chronic diseases.    TIA (transient ischemic attack) No further episodes of having TIA symptoms as of late.  Pulmonary hypertension, moderate to severe Stable.  No recent episodes of hypoxia or dyspnea.  O2 sats been stable  Mitral regurgitation Stable.  No complaints of dyspnea, no edema, denies chest pain  Placement patient has advanced dementia.  Unable to obtain complete ROS   Past Medical History:  Diagnosis Date  . Anemia    "as a child" (08/05/2013)  . Anxiety   . Aphasia as late effect of stroke 04/18/2017  . Arthritis    "all over me; in all my joints" (08/05/2013)  . Atrial fibrillation (HCC)    a. Chronic; No longer on  coumadin 2/2 frequent falls.  . Chest pain    a. 08/2011 Myoview: sm, fixed anteroseptal defect (attenuation), no ischemia, EF 62%.  . CHF (congestive heart failure) (HCC)   . Chronic atrial fibrillation (HCC) 04/18/2017  . Coccyx pain   . COPD (chronic obstructive pulmonary disease) (HCC)   . Dementia   . Dementia in Alzheimer's disease 04/18/2017  . Depression   . Diabetes mellitus type 2, controlled, without complications (HCC) 04/18/2017  . Falls frequently    coumadin stopped  . GERD (gastroesophageal reflux disease)   . Gout    "right foot" (08/05/2013)  . H/O hiatal hernia   . History of pneumonia    "couple times; long time ago" (08/05/2013)  . Hypercholesterolemia   . Hypertension   . Hypertrophic cardiomyopathy (HCC)    a. 11/2013 Echo: EF 55-60%, basal inf HK, sev dil LA, no evidence of HCM.  PASP 60mmHg.  [name]. Hyponatremia   . Hyposmolality   . Osteoarthritis   . Oxygen dependent    "3L 24/7" (08/05/2013)  . Pacemaker    a. 04/2012 MDT [id number] [employer] DC PPM, ser #: [id number] H.  . Pulmonary HTN (HCC)    a. has had prior right heart cath in 2010; was felt that most likely due to elevated left sided pressures and would not benefit from vasodilator therapy;  b. 11/2013 Echo: PASP 60mmHg.  [name]. Simple chronic bronchitis (HCC) 04/18/2017  . Situational mixed anxiety  and depressive disorder   . TIA (transient ischemic attack)   . Type II diabetes mellitus (HCC)   . Varicose veins    Past Surgical History:  Procedure Laterality Date  . ABDOMINAL HYSTERECTOMY     "partial" (08/05/2013)  . APPENDECTOMY    . BACK SURGERY     "bone spur removed; mid back" (08/05/2013)  . CARDIAC CATHETERIZATION     "more than once" (08/05/2013)  . DILATION AND CURETTAGE OF UTERUS     "several" (08/05/2013)  . FOOT NEUROMA SURGERY Right   . INSERT / REPLACE / REMOVE PACEMAKER  1998; 2000's; 2014  . PACEMAKER GENERATOR CHANGE N/A 04/24/2012   Procedure: PACEMAKER GENERATOR CHANGE;  Surgeon: Duke Salvia, MD;  Location: [facility] CATH LAB;  Service: Cardiovascular;  Laterality: N/A;  . TONSILLECTOMY    . TOTAL KNEE ARTHROPLASTY Right 2011    Allergies  Allergen Reactions  . Aspirin Nausea And Vomiting  . Ativan [Lorazepam] Other (See Comments)    Hallucinations  . Atorvastatin     Other reaction(s): Unknown  . Calan [Verapamil Hcl] Other (See Comments)    Patient states it makes her "out of her mind"; bp bottoms out (takes diltiazem at home)  . Codeine Nausea And Vomiting  . Crestor [Rosuvastatin Calcium] Other (See Comments)    Muscle pain  . Escitalopram Oxalate Other (See Comments)    Reaction Unknown   . Lactose Intolerance (Gi) Diarrhea    Can tolerate in small amounts  . Lexapro [Escitalopram] Other (See Comments)    Unknown   . Lipitor [Atorvastatin Calcium] Other (See Comments)    myalgia  . Lopressor [Metoprolol Tartrate] Other (See Comments)    Blood pressure bottoms out   . Metoprolol Tartrate Other (See Comments)    Unknown   . Nitroglycerin Other (See Comments)    Reaction unknown  . Norvasc [Amlodipine Besylate] Other (See Comments)    fatigue  . Nsaids Nausea Only  . Oxycodone Other (See Comments)    Reaction unknown  . Percocet [Oxycodone-Acetaminophen] Nausea And Vomiting  . Prednisolone     Other reaction(s): Unknown  . Prednisone Itching and Swelling  . Rosuvastatin     Other reaction(s): Unknown  . Sulfa Antibiotics Nausea Only and Other (See Comments)    Makes her stomach hurt  . Tape Other (See Comments)    Skin tears & bruises easily (SKIN IS THIN!!)  . Verapamil     Other reaction(s): Unknown  . Vimovo [Naproxen-Esomeprazole] Itching, Nausea Only and Swelling  . Zoloft [Sertraline] Other (See Comments)    UNKNOWN   . Penicillins Hives and Rash    Has patient had a PCN reaction causing immediate rash, facial/tongue/throat swelling, SOB or lightheadedness with hypotension: Yes Has patient had a PCN reaction causing severe rash involving  mucus membranes or skin necrosis: No Has patient had a PCN reaction that required hospitalization No Has patient had a PCN reaction occurring within the last 10 years: No If all of the above answers are "NO", then may proceed with Cephalosporin use.     Allergies as of 10/13/2017      Reactions   Aspirin Nausea And Vomiting   Ativan [lorazepam] Other (See Comments)   Hallucinations   Atorvastatin    Other reaction(s): Unknown   Calan [verapamil Hcl] Other (See Comments)   Patient states it makes her "out of her mind"; bp bottoms out (takes diltiazem at home)   Codeine Nausea And Vomiting   Crestor [employer]  Calcium] Other (See Comments)   Muscle pain   Escitalopram Oxalate Other (See Comments)   Reaction Unknown   Lactose Intolerance (gi) Diarrhea   Can tolerate in small amounts   Lexapro [escitalopram] Other (See Comments)   Unknown   Lipitor [atorvastatin Calcium] Other (See Comments)   myalgia   Lopressor [metoprolol Tartrate] Other (See Comments)   Blood pressure bottoms out   Metoprolol Tartrate Other (See Comments)   Unknown   Nitroglycerin Other (See Comments)   Reaction unknown   Norvasc [amlodipine Besylate] Other (See Comments)   fatigue   Nsaids Nausea Only   Oxycodone Other (See Comments)   Reaction unknown   Percocet [oxycodone-acetaminophen] Nausea And Vomiting   Prednisolone    Other reaction(s): Unknown   Prednisone Itching, Swelling   Rosuvastatin    Other reaction(s): Unknown   Sulfa Antibiotics Nausea Only, Other (See Comments)   Makes her stomach hurt   Tape Other (See Comments)   Skin tears & bruises easily (SKIN IS THIN!!)   Verapamil    Other reaction(s): Unknown   Vimovo [naproxen-esomeprazole] Itching, Nausea Only, Swelling   Zoloft [sertraline] Other (See Comments)   UNKNOWN   Penicillins Hives, Rash   Has patient had a PCN reaction causing immediate rash, facial/tongue/throat swelling, SOB or lightheadedness with hypotension:  Yes Has patient had a PCN reaction causing severe rash involving mucus membranes or skin necrosis: No Has patient had a PCN reaction that required hospitalization No Has patient had a PCN reaction occurring within the last 10 years: No If all of the above answers are "NO", then may proceed with Cephalosporin use.      Medication List        Accurate as of 10/13/17 11:59 PM. Always use your most recent med list.          acetaminophen 325 MG tablet Commonly known as:  TYLENOL Take 650 mg by mouth 4 (four) times daily.   ALPRAZolam 0.25 MG tablet Commonly known as:  XANAX Take 1 tablet (0.25 mg total) by mouth 2 (two) times daily. At 9 am and 9 pm   alum & mag hydroxide-simeth 400-400-40 MG/5ML suspension Commonly known as:  MAALOX PLUS Take 30 mLs by mouth every 4 (four) hours as needed.   benzocaine 10 % mucosal gel Commonly known as:  ORAJEL Apply thin film to area of soreness on gums every 2 hours as needed   bisacodyl 10 MG suppository Commonly known as:  DULCOLAX Place 10 mg rectally daily as needed.   clopidogrel 75 MG tablet Commonly known as:  PLAVIX Take 1 tablet (75 mg total) by mouth daily.   divalproex 125 MG capsule Commonly known as:  DEPAKOTE SPRINKLE Take 125 mg by mouth 3 (three) times daily with meals.   famotidine 40 MG tablet Commonly known as:  PEPCID Take 40 mg by mouth daily. For use while on oxycodone   mirtazapine 7.5 MG tablet Commonly known as:  REMERON Take 7.5 mg by mouth at bedtime.   oxybutynin 5 MG tablet Commonly known as:  DITROPAN Take 5 mg by mouth 2 (two) times daily.   oxyCODONE 20 MG/ML concentrated solution Commonly known as:  ROXICODONE INTENSOL Take 10 mg by mouth 4 (four) times daily. For pain, dyspnea. Ok to give prn doses in addition to scheduled for severe pain. Monitor reaction. Per Epic, allergic reaction is GI upset.   oxyCODONE 20 MG/ML concentrated solution Commonly known as:  ROXICODONE INTENSOL Give 0.25  ml - 0.5  ml by mouth every 2 hours as needed for pain, dyspnea. Monitor for reaction. Per EPIC allergic reaction is GI upset.  0.25 - mild; 0.5 ml - moderate to severe   senna 8.6 MG Tabs tablet Commonly known as:  SENOKOT Take 1 tablet by mouth 2 (two) times daily.   UNABLE TO FIND Med Name: Epsom Salt crystals 100% : Use 1/4 cup of epsom salt with warm water daily. Soak until water cools, dry well and then do dressing.       Review of Systems  Unable to perform ROS: Dementia  Constitutional: Negative for activity change, appetite change, chills, diaphoresis and fever.  HENT: Negative for congestion, mouth sores, nosebleeds, postnasal drip, sneezing, sore throat, trouble swallowing and voice change.   Respiratory: Negative for apnea, cough, choking, chest tightness, shortness of breath and wheezing.   Cardiovascular: Negative for chest pain, palpitations and leg swelling.  Gastrointestinal: Negative for abdominal distention, abdominal pain, constipation, diarrhea and nausea.  Genitourinary: Negative for difficulty urinating, dysuria, frequency and urgency.  Musculoskeletal: Positive for arthralgias (typical arthritis). Negative for back pain, gait problem and myalgias.  Skin: Negative for color change, pallor, rash and wound.  Neurological: Negative for dizziness, tremors, syncope, speech difficulty, weakness, numbness and headaches.  Psychiatric/Behavioral: Positive for confusion. Negative for agitation and behavioral problems.  All other systems reviewed and are negative.   Immunization History  Administered Date(s) Administered  . Influenza Split 09/11/2012  . Influenza Whole 10/15/2010  . Influenza, High Dose Seasonal PF 08/20/2017  . Influenza-Unspecified 08/10/2013, 02/06/2016, 08/27/2016  . PPD Test 08/24/2013, 02/06/2016, 02/13/2017  . Pneumococcal Polysaccharide-23 10/15/2010  . Pneumococcal-Unspecified 08/10/2013, 02/06/2016  . Tdap 10/01/2013   Pertinent  Health  Maintenance Due  Topic Date Due  . FOOT EXAM  06/18/1946  . OPHTHALMOLOGY EXAM  06/18/1946  . URINE MICROALBUMIN  06/18/1946  . PNA vac Low Risk Adult (2 of 2 - PCV13) 02/05/2017  . HEMOGLOBIN A1C  03/17/2017  . INFLUENZA VACCINE  Completed  . DEXA SCAN  Completed   Fall Risk  05/18/2013  Falls in the past year? Yes  Number falls in past yr: 1  Injury with Fall? No  Risk for fall due to : Impaired balance/gait;Impaired mobility   Functional Status Survey:    Vitals:   10/13/17 1413  BP: (!) 119/57  Pulse: (!) 59  Resp: 20  Temp: 97.7 F (36.5 C)  TempSrc: Oral  SpO2: 98%  Weight: 106 lb 11.2 oz (48.4 kg)  Height: 5' (1.524 m)   Body mass index is 20.84 kg/m. Physical Exam  Constitutional: Vital signs are normal. She appears well-developed and well-nourished. She is active and cooperative. She does not appear ill. No distress.  HENT:  Head: Normocephalic and atraumatic.  Mouth/Throat: Uvula is midline, oropharynx is clear and moist and mucous membranes are normal. Mucous membranes are not pale, not dry and not cyanotic.  Eyes: Conjunctivae, EOM and lids are normal. Pupils are equal, round, and reactive to light.  Neck: Trachea normal, normal range of motion and full passive range of motion without pain. Neck supple. No JVD present. No tracheal deviation, no edema and no erythema present. No thyromegaly present.  Cardiovascular: Normal rate, regular rhythm, normal heart sounds, intact distal pulses and normal pulses. Exam reveals no gallop, no distant heart sounds and no friction rub.  No murmur heard. Pulses:      Dorsalis pedis pulses are 2+ on the right side, and 2+ on the left side.  No  edema  Pulmonary/Chest: Effort normal and breath sounds normal. No accessory muscle usage. No respiratory distress. She has no decreased breath sounds. She has no wheezes. She has no rhonchi. She has no rales. She exhibits no tenderness.  Abdominal: Soft. Normal appearance and bowel  sounds are normal. She exhibits no distension and no ascites. There is no tenderness.  Musculoskeletal: Normal range of motion. She exhibits no edema or tenderness.  Expected osteoarthritis, stiffness; Bilateral Calves soft, supple. Negative Homan's Sign. B- pedal pulses equal  Neurological: She is alert. She has normal strength. She is disoriented. She exhibits abnormal muscle tone. Coordination and gait abnormal.  Skin: Skin is warm, dry and intact. She is not diaphoretic. No cyanosis. No pallor. Nails show no clubbing.  Psychiatric: Her speech is normal. Thought content normal. Her affect is blunt. She is withdrawn. Cognition and memory are impaired. She expresses impulsivity. She exhibits abnormal recent memory and abnormal remote memory. She is inattentive.  Nursing note and vitals reviewed.   Labs reviewed: No results for input(s): NA, K, CL, CO2, GLUCOSE, BUN, CREATININE, CALCIUM, MG, PHOS in the last 8760 hours. No results for input(s): AST, ALT, ALKPHOS, BILITOT, PROT, ALBUMIN in the last 8760 hours. No results for input(s): WBC, NEUTROABS, HGB, HCT, MCV, PLT in the last 8760 hours. Lab Results  Component Value Date   TSH 1.674 02/07/2015   Lab Results  Component Value Date   HGBA1C 5.6 09/17/2016   Lab Results  Component Value Date   CHOL 288 (H) 09/17/2016   HDL 43 09/17/2016   LDLCALC 207 (H) 09/17/2016   TRIG 190 (H) 09/17/2016   CHOLHDL 6.7 09/17/2016    Significant Diagnostic Results in last 30 days:  No results found.  Assessment/Plan [name] [name] was seen today for medical management of chronic issues.  Diagnoses and all orders for this visit:  TIA (transient ischemic attack)  Pulmonary hypertension, moderate to severe (HCC)  Mitral valve insufficiency, unspecified etiology   Above conditions stable  Continue current medication regimen  Follow-up with cardiology as needed  Symptoms managed at this time  Patient continues on hospice services  Family/  staff Communication:   Total Time:  Documentation:  Face to Face:  Family/Phone:   Labs/tests ordered: None, on hospice services  Medication list reviewed and assessed for continued appropriateness. Monthly medication orders reviewed and signed.  Brynda Rim, NP-C [facility] [phone] [address] [place], [place] [address] Cell Phone (Mon-Fri 8am-5pm):  [phone] On Call:  [phone] & follow prompts after 5pm & weekends Office Phone:  [phone] Office Fax:  [phone]

## 2017-11-29 DIAGNOSIS — I739 Peripheral vascular disease, unspecified: Secondary | ICD-10-CM | POA: Insufficient documentation

## 2017-11-29 NOTE — Assessment & Plan Note (Signed)
Stable. No recent episodes of CHF or peripheral edema. No c/o chest pain or dypsnea

## 2017-11-29 NOTE — Assessment & Plan Note (Signed)
Progressive. No signs of chest pain. Pt is non-verbal

## 2017-11-29 NOTE — Assessment & Plan Note (Signed)
Progressive. Necrotic right 3rd toe. Protecting with gauze and kerlix wrap. No other treatment. Pt is on Hospice services and not a surgical candidate

## 2017-11-29 NOTE — Progress Notes (Signed)
Location:    [facility]ursing Home Room Number: 325A Place of Service:  SNF (31) Provider:  Lorenso QuarryShannon Nickalas Mccarrick, NP-C  Lauro RegulusAnderson, Marshall W, MD  Patient Care Team: Lauro RegulusAnderson, Marshall W, MD as PCP - General (Internal Medicine) Lorenso QuarryLeach, Ichiro Chesnut, NP as Nurse Practitioner (Family Medicine)  Extended Emergency Contact Information Primary Emergency Contact: Roderic OvensSmith,[name]          [place] Mobile Phone: [phone] Relation: Daughter Secondary Emergency Contact: Greer EeBrown,Karen Address: [address]          [place], [place] [address] [place] Home Phone: [phone] Mobile Phone: [phone] Relation: Daughter  Code Status:  DNR Goals of care: Advanced Directive information Advanced Directives 11/12/2017  Does Patient Have a Medical Advance Directive? Yes  Type of Advance Directive Out of facility DNR (pink MOST or yellow form)  Does patient want to make changes to medical advance directive? No - Patient declined  Copy of Healthcare Power of Attorney in Chart? -  Would patient like information on creating a medical advance directive? -  Pre-existing out of facility DNR order (yellow form or pink MOST form) -     Chief Complaint  Patient presents with  . Medical Management of Chronic Issues    Routine Visit    HPI:  Pt is a 82 y.o. female seen today for medical management of chronic diseases.    PVD (peripheral vascular disease) (HCC) Progressive. Necrotic right 3rd toe. Protecting with gauze and kerlix wrap. No other treatment. Pt is on Hospice services and not a surgical candidate  Hypertrophic cardiomyopathy Progressive. No signs of chest pain. Pt is non-verbal  Hypertensive heart disease with heart failure (HCC) Stable. No recent episodes of CHF or peripheral edema. No c/o chest pain or dypsnea  Please not pt with limited verbal ability. Unable to obtain complete ROS. Some ROS info obtained from staff and documentation.   Past Medical  History:  Diagnosis Date  . Anemia    "as a child" (08/05/2013)  . Anxiety   . Aphasia as late effect of stroke 04/18/2017  . Arthritis    "all over me; in all my joints" (08/05/2013)  . Atrial fibrillation (HCC)    a. Chronic; No longer on coumadin 2/2 frequent falls.  . Chest pain    a. 08/2011 Myoview: sm, fixed anteroseptal defect (attenuation), no ischemia, EF 62%.  . CHF (congestive heart failure) (HCC)   . Chronic atrial fibrillation (HCC) 04/18/2017  . Coccyx pain   . COPD (chronic obstructive pulmonary disease) (HCC)   . Dementia   . Dementia in Alzheimer's disease 04/18/2017  . Depression   . Diabetes mellitus type 2, controlled, without complications (HCC) 04/18/2017  . Falls frequently    coumadin stopped  . GERD (gastroesophageal reflux disease)   . Gout    "right foot" (08/05/2013)  . H/O hiatal hernia   . History of pneumonia    "couple times; long time ago" (08/05/2013)  . Hypercholesterolemia   . Hypertension   . Hypertrophic cardiomyopathy (HCC)    a. 11/2013 Echo: EF 55-60%, basal inf HK, sev dil LA, no evidence of HCM.  PASP 60mmHg.  [name]. Hyponatremia   . Hyposmolality   . Osteoarthritis   . Oxygen dependent    "3L 24/7" (08/05/2013)  . Pacemaker    a. 04/2012 MDT [id number] [employer] DC PPM, ser #: [id number] H.  . Pulmonary HTN (HCC)    a. has had prior right heart cath in 2010; was felt that  most likely due to elevated left sided pressures and would not benefit from vasodilator therapy;  b. 11/2013 Echo: PASP .  [name] Simple chronic bronchitis (HCC) 04/18/2017  . Situational mixed anxiety and depressive disorder   . TIA (transient ischemic attack)   . Type II diabetes mellitus (HCC)   . Varicose veins    Past Surgical History:  Procedure Laterality Date  . ABDOMINAL HYSTERECTOMY     "partial" (08/05/2013)  . APPENDECTOMY    . BACK SURGERY     "bone spur removed; mid back" (08/05/2013)  . CARDIAC CATHETERIZATION     "more than once" (08/05/2013)  . DILATION  AND CURETTAGE OF UTERUS     "several" (08/05/2013)  . FOOT NEUROMA SURGERY Right   . INSERT / REPLACE / REMOVE PACEMAKER  1998; 2000's; 2014  . PACEMAKER GENERATOR CHANGE N/A 04/24/2012   Procedure: PACEMAKER GENERATOR CHANGE;  Surgeon: Duke Salvia, MD;  Location: [facility] CATH LAB;  Service: Cardiovascular;  Laterality: N/A;  . TONSILLECTOMY    . TOTAL KNEE ARTHROPLASTY Right 2011    Allergies  Allergen Reactions  . Aspirin Nausea And Vomiting  . Ativan [Lorazepam] Other (See Comments)    Hallucinations  . Atorvastatin     Other reaction(s): Unknown  . Calan [Verapamil Hcl] Other (See Comments)    Patient states it makes her "out of her mind"; bp bottoms out (takes diltiazem at home)  . Codeine Nausea And Vomiting  . Crestor [Rosuvastatin Calcium] Other (See Comments)    Muscle pain  . Escitalopram Oxalate Other (See Comments)    Reaction Unknown   . Lactose Intolerance (Gi) Diarrhea    Can tolerate in small amounts  . Lexapro [Escitalopram] Other (See Comments)    Unknown   . Lipitor [Atorvastatin Calcium] Other (See Comments)    myalgia  . Lopressor [Metoprolol Tartrate] Other (See Comments)    Blood pressure bottoms out   . Metoprolol Tartrate Other (See Comments)    Unknown   . Nitroglycerin Other (See Comments)    Reaction unknown  . Norvasc [Amlodipine Besylate] Other (See Comments)    fatigue  . Nsaids Nausea Only  . Oxycodone Other (See Comments)    Reaction unknown  . Percocet [Oxycodone-Acetaminophen] Nausea And Vomiting  . Prednisolone     Other reaction(s): Unknown  . Prednisone Itching and Swelling  . Rosuvastatin     Other reaction(s): Unknown  . Sulfa Antibiotics Nausea Only and Other (See Comments)    Makes her stomach hurt  . Tape Other (See Comments)    Skin tears & bruises easily (SKIN IS THIN!!)  . Verapamil     Other reaction(s): Unknown  . Vimovo [Naproxen-Esomeprazole] Itching, Nausea Only and Swelling  . Zoloft [Sertraline] Other (See  Comments)    UNKNOWN   . Penicillins Hives and Rash    Has patient had a PCN reaction causing immediate rash, facial/tongue/throat swelling, SOB or lightheadedness with hypotension: Yes Has patient had a PCN reaction causing severe rash involving mucus membranes or skin necrosis: No Has patient had a PCN reaction that required hospitalization No Has patient had a PCN reaction occurring within the last 10 years: No If all of the above answers are "NO", then may proceed with Cephalosporin use.     Allergies as of 11/12/2017      Reactions   Aspirin Nausea And Vomiting   Ativan [lorazepam] Other (See Comments)   Hallucinations   Atorvastatin    Other reaction(s): Unknown   Calan [  verapamil Hcl] Other (See Comments)   Patient states it makes her "out of her mind"; bp bottoms out (takes diltiazem at home)   Codeine Nausea And Vomiting   Crestor [rosuvastatin Calcium] Other (See Comments)   Muscle pain   Escitalopram Oxalate Other (See Comments)   Reaction Unknown   Lactose Intolerance (gi) Diarrhea   Can tolerate in small amounts   Lexapro [escitalopram] Other (See Comments)   Unknown   Lipitor [atorvastatin Calcium] Other (See Comments)   myalgia   Lopressor [metoprolol Tartrate] Other (See Comments)   Blood pressure bottoms out   Metoprolol Tartrate Other (See Comments)   Unknown   Nitroglycerin Other (See Comments)   Reaction unknown   Norvasc [amlodipine Besylate] Other (See Comments)   fatigue   Nsaids Nausea Only   Oxycodone Other (See Comments)   Reaction unknown   Percocet [oxycodone-acetaminophen] Nausea And Vomiting   Prednisolone    Other reaction(s): Unknown   Prednisone Itching, Swelling   Rosuvastatin    Other reaction(s): Unknown   Sulfa Antibiotics Nausea Only, Other (See Comments)   Makes her stomach hurt   Tape Other (See Comments)   Skin tears & bruises easily (SKIN IS THIN!!)   Verapamil    Other reaction(s): Unknown   Vimovo [naproxen-esomeprazole]  Itching, Nausea Only, Swelling   Zoloft [sertraline] Other (See Comments)   UNKNOWN   Penicillins Hives, Rash   Has patient had a PCN reaction causing immediate rash, facial/tongue/throat swelling, SOB or lightheadedness with hypotension: Yes Has patient had a PCN reaction causing severe rash involving mucus membranes or skin necrosis: No Has patient had a PCN reaction that required hospitalization No Has patient had a PCN reaction occurring within the last 10 years: No If all of the above answers are "NO", then may proceed with Cephalosporin use.      Medication List        Accurate as of 11/12/17 11:59 PM. Always use your most recent med list.          acetaminophen 325 MG tablet Commonly known as:  TYLENOL Take 650 mg by mouth 4 (four) times daily.   ALPRAZolam 0.25 MG tablet Commonly known as:  XANAX Take 1 tablet (0.25 mg total) by mouth 2 (two) times daily. At 9 am and 9 pm   alum & mag hydroxide-simeth 400-400-40 MG/5ML suspension Commonly known as:  MAALOX PLUS Take 30 mLs by mouth every 4 (four) hours as needed.   benzocaine 10 % mucosal gel Commonly known as:  ORAJEL Apply thin film to area of soreness on gums every 2 hours as needed   BETADINE SWABSTICKS 10 % Swab Generic drug:  Povidone-Iodine Scrub Sponge Apply 1 each topically daily. Swab necrotic toes with betadine sticks to help control bioburden   bisacodyl 10 MG suppository Commonly known as:  DULCOLAX Place 10 mg rectally daily as needed.   clopidogrel 75 MG tablet Commonly known as:  PLAVIX Take 1 tablet (75 mg total) by mouth daily.   divalproex 125 MG capsule Commonly known as:  DEPAKOTE SPRINKLE Take 125 mg by mouth 3 (three) times daily with meals.   famotidine 40 MG tablet Commonly known as:  PEPCID Take 40 mg by mouth daily. For use while on oxycodone   mirtazapine 7.5 MG tablet Commonly known as:  REMERON Take 7.5 mg by mouth at bedtime.   oxybutynin 5 MG tablet Commonly known as:   DITROPAN Take 5 mg by mouth 2 (two) times daily.   oxyCODONE  20 MG/ML concentrated solution Commonly known as:  ROXICODONE INTENSOL Give 0.25 ml - 0.5 ml by mouth every 2 hours as needed for pain, dyspnea. Monitor for reaction. Per EPIC allergic reaction is GI upset.  0.25 - mild; 0.5 ml - moderate to severe   oxyCODONE 20 MG/ML concentrated solution Commonly known as:  ROXICODONE INTENSOL Take 0.5 mLs (10 mg total) by mouth 4 (four) times daily. For pain, dyspnea. Ok to give prn doses in addition to scheduled for severe pain. Monitor reaction. Per Epic, allergic reaction is GI upset.   senna 8.6 MG Tabs tablet Commonly known as:  SENOKOT Take 1 tablet by mouth 2 (two) times daily.       Review of Systems  Unable to perform ROS: Patient nonverbal  Constitutional: Negative for activity change, appetite change, chills, diaphoresis and fever.  HENT: Negative for congestion, mouth sores, nosebleeds, postnasal drip, sneezing, sore throat, trouble swallowing and voice change.   Respiratory: Negative for apnea, cough, choking, chest tightness, shortness of breath and wheezing.   Cardiovascular: Negative for chest pain, palpitations and leg swelling.  Gastrointestinal: Negative for abdominal distention, abdominal pain, constipation, diarrhea and nausea.  Genitourinary: Negative for difficulty urinating, dysuria, frequency and urgency.  Musculoskeletal: Positive for arthralgias (typical arthritis) and gait problem. Negative for back pain and myalgias.  Skin: Positive for wound. Negative for color change, pallor and rash.  Neurological: Positive for weakness. Negative for dizziness, tremors, syncope, speech difficulty, numbness and headaches.  Psychiatric/Behavioral: Positive for confusion. Negative for agitation and behavioral problems.  All other systems reviewed and are negative.   Immunization History  Administered Date(s) Administered  . Influenza Split 09/11/2012  . Influenza Whole  10/15/2010  . Influenza, High Dose Seasonal PF 08/20/2017  . Influenza-Unspecified 08/10/2013, 02/06/2016, 08/27/2016  . PPD Test 08/24/2013, 02/06/2016, 02/13/2017  . Pneumococcal Polysaccharide-23 10/15/2010  . Pneumococcal-Unspecified 08/10/2013, 02/06/2016  . Tdap 10/01/2013   Pertinent  Health Maintenance Due  Topic Date Due  . FOOT EXAM  06/18/1946  . OPHTHALMOLOGY EXAM  06/18/1946  . URINE MICROALBUMIN  06/18/1946  . PNA vac Low Risk Adult (2 of 2 - PCV13) 02/05/2017  . HEMOGLOBIN A1C  03/17/2017  . INFLUENZA VACCINE  Completed  . DEXA SCAN  Completed   Fall Risk  05/18/2013  Falls in the past year? Yes  Number falls in past yr: 1  Injury with Fall? No  Risk for fall due to : Impaired balance/gait;Impaired mobility   Functional Status Survey:    Vitals:   11/12/17 1548  BP: (!) 117/50  Pulse: (!) 58  Resp: 20  Temp: (!) 97.5 F (36.4 C)  TempSrc: Oral  SpO2: 100%  Weight: 106 lb 12.8 oz (48.4 kg)  Height: 5\' 6"  (1.676 m)   Body mass index is 17.24 kg/m. Physical Exam  Constitutional: Vital signs are normal. She appears well-developed and well-nourished. She is active and cooperative. She does not appear ill. No distress.  HENT:  Head: Normocephalic and atraumatic.  Mouth/Throat: Uvula is midline, oropharynx is clear and moist and mucous membranes are normal. Mucous membranes are not pale, not dry and not cyanotic.  Eyes: Conjunctivae, EOM and lids are normal. Pupils are equal, round, and reactive to light.  Neck: Trachea normal, normal range of motion and full passive range of motion without pain. Neck supple. No JVD present. No tracheal deviation, no edema and no erythema present. No thyromegaly present.  Cardiovascular: Normal rate, regular rhythm, normal heart sounds and intact distal pulses. Exam  reveals no gallop, no distant heart sounds and no friction rub.  No murmur heard. Pulses:      Dorsalis pedis pulses are 1+ on the right side, and 2+ on the left  side.  No edema, PVD with toe necrosis  Pulmonary/Chest: Effort normal and breath sounds normal. No accessory muscle usage. No respiratory distress. She has no decreased breath sounds. She has no wheezes. She has no rhonchi. She has no rales. She exhibits no tenderness.  Abdominal: Soft. Normal appearance and bowel sounds are normal. She exhibits no distension and no ascites. There is no tenderness.  Musculoskeletal: Normal range of motion. She exhibits no edema or tenderness.  Expected osteoarthritis, stiffness; Bilateral Calves soft, supple. Negative Homan's Sign. B- pedal pulses equal; generalized weakness; non-ambulatory  Neurological: She is alert. She has normal strength. She is disoriented. She displays atrophy. A sensory deficit is present. She exhibits abnormal muscle tone. Coordination and gait abnormal.  Skin: Skin is warm, dry and intact. She is not diaphoretic. No cyanosis. No pallor. Nails show no clubbing.     Psychiatric: Her speech is normal and behavior is normal. Judgment and thought content normal. Her affect is blunt. Cognition and memory are impaired. She exhibits abnormal recent memory and abnormal remote memory.  Nursing note and vitals reviewed.   Labs reviewed: No results for input(s): NA, K, CL, CO2, GLUCOSE, BUN, CREATININE, CALCIUM, MG, PHOS in the last 8760 hours. No results for input(s): AST, ALT, ALKPHOS, BILITOT, PROT, ALBUMIN in the last 8760 hours. No results for input(s): WBC, NEUTROABS, HGB, HCT, MCV, PLT in the last 8760 hours. Lab Results  Component Value Date   TSH 1.674 02/07/2015   Lab Results  Component Value Date   HGBA1C 5.6 09/17/2016   Lab Results  Component Value Date   CHOL 288 (H) 09/17/2016   HDL 43 09/17/2016   LDLCALC 207 (H) 09/17/2016   TRIG 190 (H) 09/17/2016   CHOLHDL 6.7 09/17/2016    Significant Diagnostic Results in last 30 days:  No results found.  Assessment/Plan [name] [name] was seen today for medical management of chronic  issues.  Diagnoses and all orders for this visit:  Hypertrophic cardiomyopathy (HCC)  PVD (peripheral vascular disease) (HCC)  Hypertensive heart disease with heart failure (HCC)   Above listed conditions slowly progressive   Continue with gauze and kerlix wraps for protection of right 3rd toe  Continue to paint right 3rd toe with betadine daily to decrease bioburden  Monitor closely for worsening pain  Pt continues on Hospice services  Continue current medication regimen  Continue Oxyfast 20 mg/ml 10 mg po QID scheduled  Continue Oxyfast 20 mg/mL 0.25-0.5 mL PO Q 2 hours prn severe pain  Continue alprazolam 0.25 mg po BID for anxiety  Family/ staff Communication:   Total Time:  Documentation:  Face to Face:  Family/Phone:   Labs/tests ordered:  none  Medication list reviewed and assessed for continued appropriateness. Monthly medication orders reviewed and signed.  [name] Rim, NP-C Geriatrics [facility] [phone], [place] [address] Cell Phone (Mon-Fri 8am-5pm):  [phone] On Call:  [phone] & follow prompts after 5pm & weekends Office Phone:  [phone] Office Fax:  [phone]

## 2017-12-03 IMAGING — CT CT HEAD W/O CM
5 of 6 series · 17 of 47 positions shown, 18 images · non-contrast
Comparison: CT cervical spine October 03, 2015; head CT May 16, 2016

CLINICAL DATA: CT cervical spine:

EXAM:
CT HEAD WITHOUT CONTRAST
CT CERVICAL SPINE WITHOUT CONTRAST
TECHNIQUE: Multidetector CT imaging of the head and cervical spine was
performed following the standard protocol without intravenous
contrast. Multiplanar CT image reconstructions of the cervical spine
were also generated.

[Series 2: head wo · axial · 0.41mm/px · z∈[-32,+18]mm · 2 of 31 slices shown, 3 images]
[im 11/31  brain]
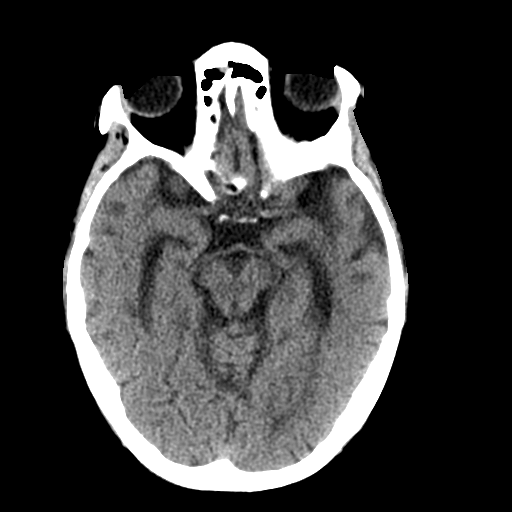
[im 11/31  bone]
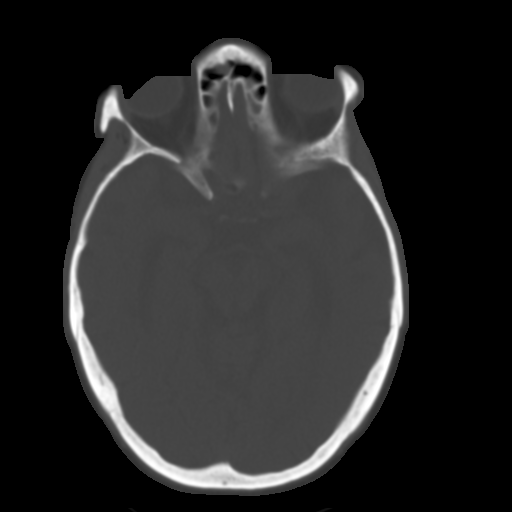
[im 21/31  brain]
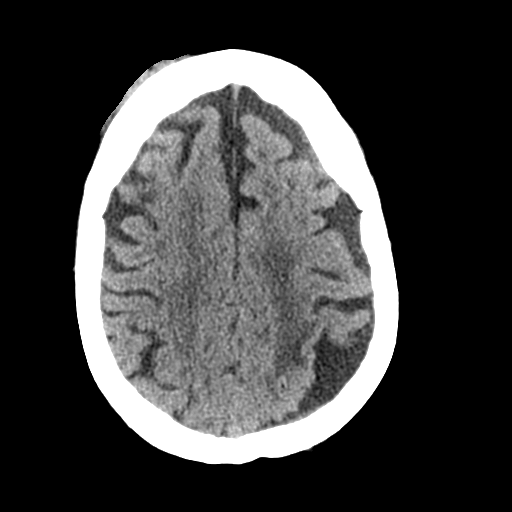

[Series 6: coronal soft tissue · coronal · 0.29mm/px · 3 of 63 slices shown]
[im 21/63  brain]
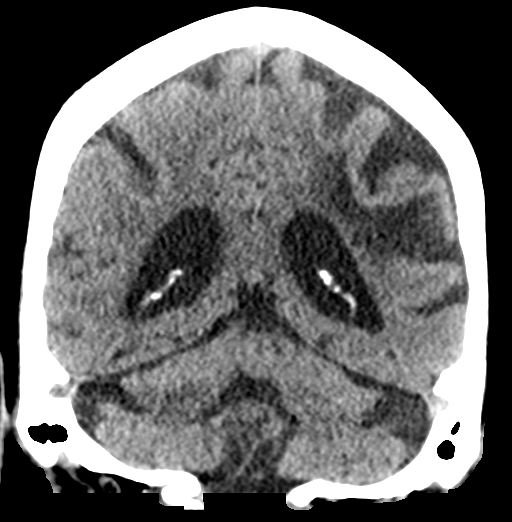
[im 28/63  brain]
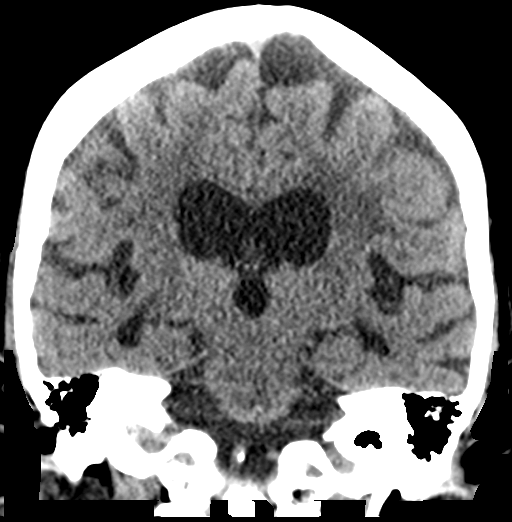
[im 35/63  brain]
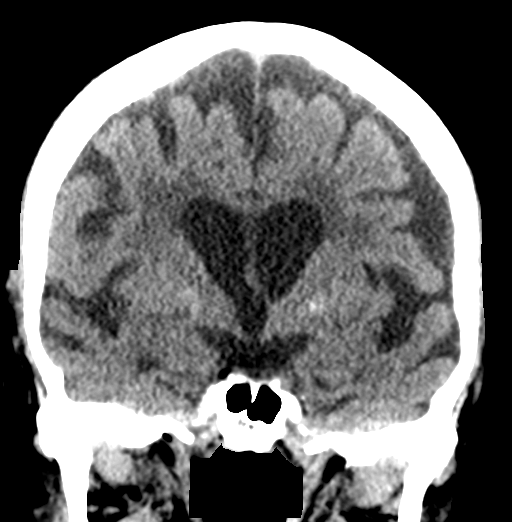

[Series 7: sagittal soft tissue · sagittal · 0.29mm/px · 1 of 47 slices shown]
[im 24/47  brain]
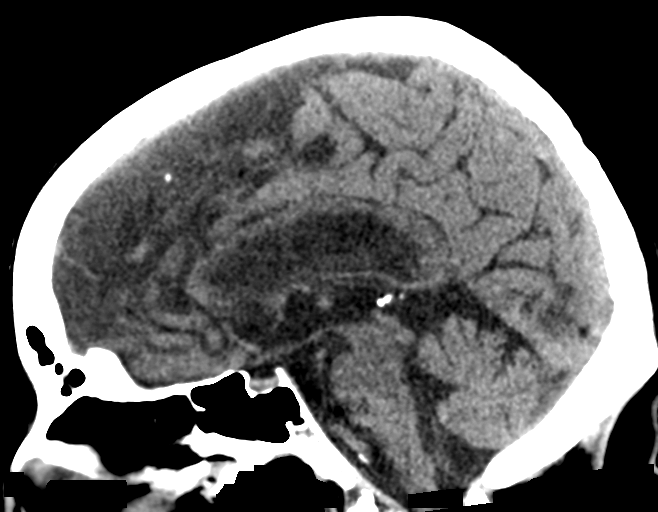

[Series 8: c spine soft · axial · 0.39mm/px · z∈[-189,-161]mm · 3 of 74 slices shown]
[im 8/74  brain]
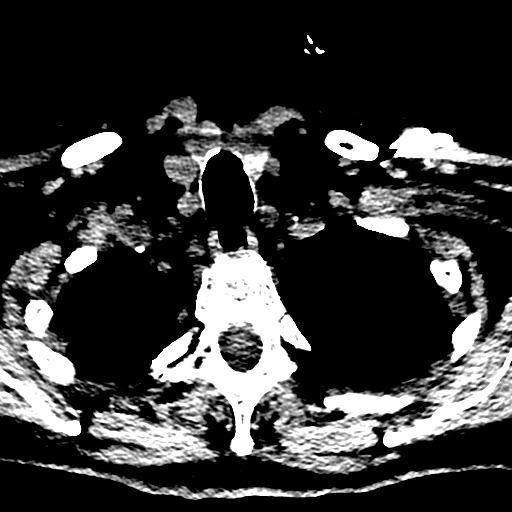
[im 15/74  brain]
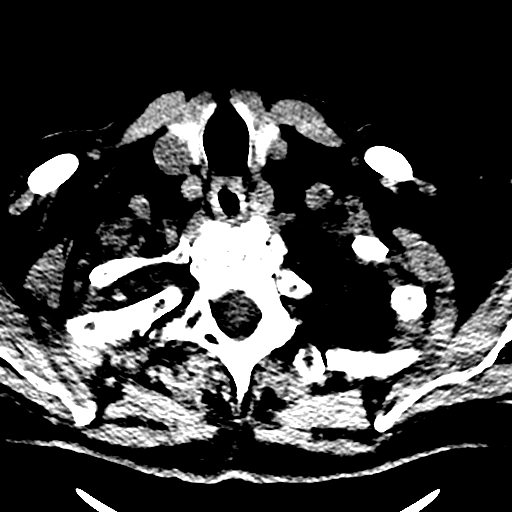
[im 22/74  brain]
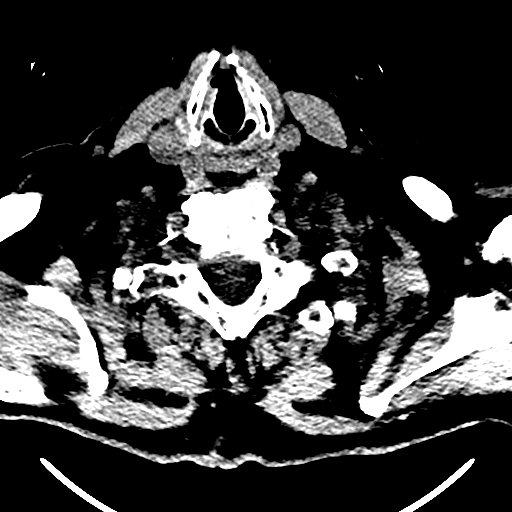

[Series 11: orthogonal bone · axial · 0.23mm/px · z∈[-242,-100]mm · 8 of 104 slices shown]
[im 8/104  bone]
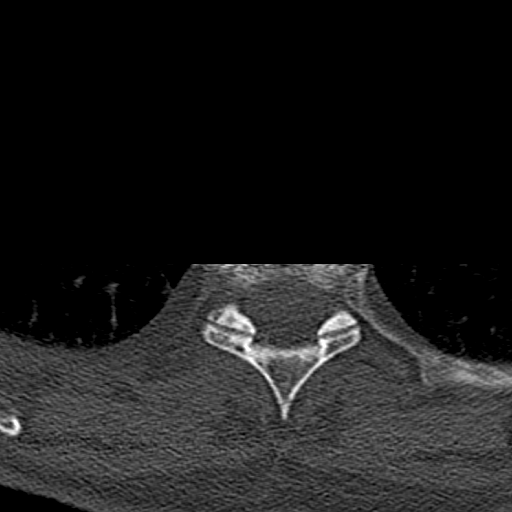
[im 23/104  bone]
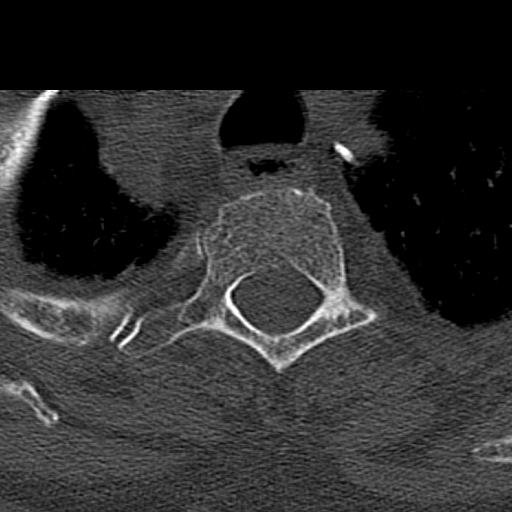
[im 37/104  bone]
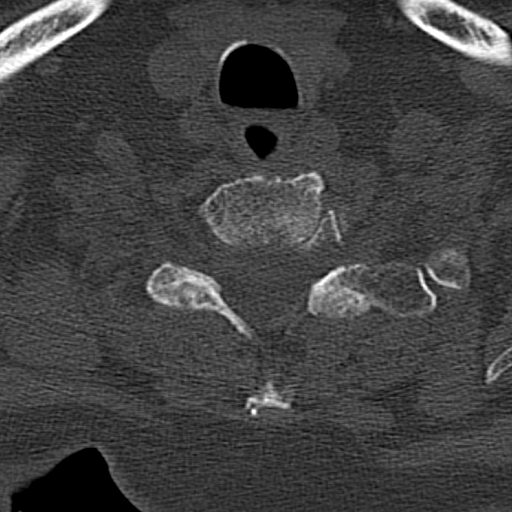
[im 45/104  bone]
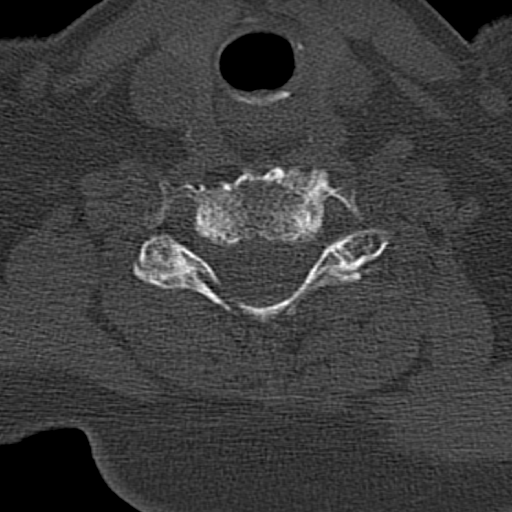
[im 59/104  bone]
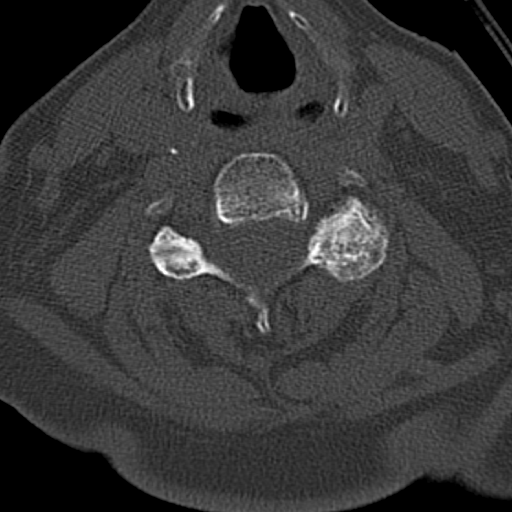
[im 67/104  bone]
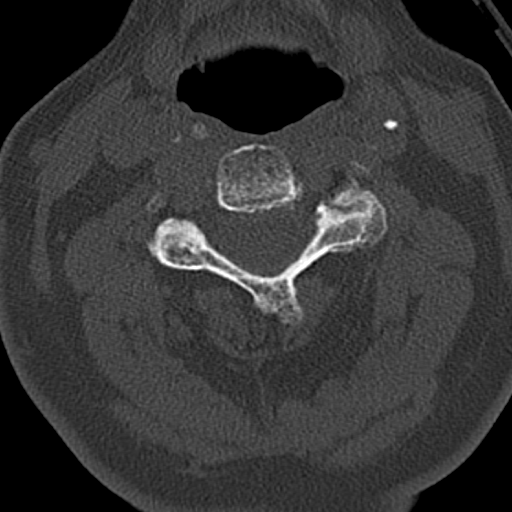
[im 81/104  bone]
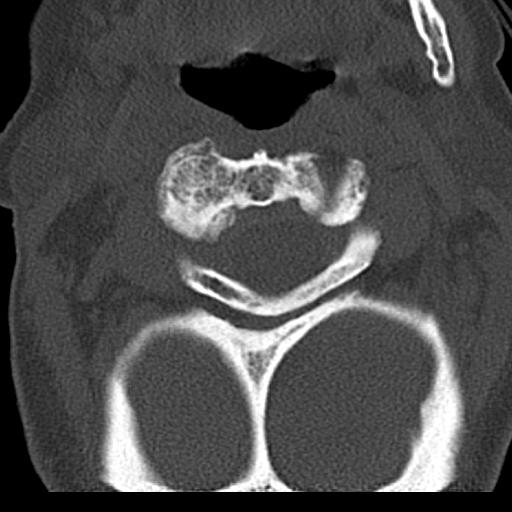
[im 96/104  bone]
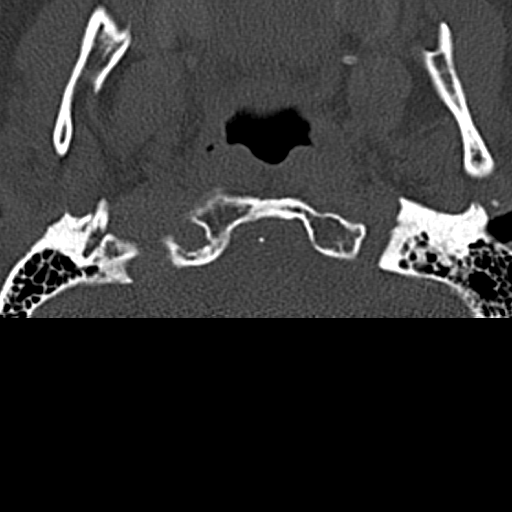

[17 of 47 positions shown; findings below may reference images not displayed]

FINDINGS: CT HEAD FINDINGS

There is moderate diffuse atrophy. There is no intracranial mass,
hemorrhage, extra-axial fluid collection, or midline shift. There is
evidence of a prior infarct at the left temporal - occipital
junction, stable. There is evidence of a prior lacunar type infarct
in the inferior right cerebellum, stable. There is small vessel
disease throughout the centra semiovale bilaterally, stable. No
acute infarct evident. There is a right frontal scalp hematoma. The
bony calvarium appears intact without fracture. The mastoid air
cells are clear. Orbits appear symmetric bilaterally. There is mild
mucosal thickening in several ethmoid air cells bilaterally.
Visualized paranasal sinuses elsewhere clear. There are foci of
calcification in both distal vertebral arteries as well as in the
carotid siphon regions bilaterally. There are no hyperdense vessels.

CT CERVICAL SPINE FINDINGS

There is no demonstrable fracture or spondylolisthesis. Prevertebral
soft tissues and predental space regions are normal. There is a
Schmorl's node along the superior endplate of T1, stable. There is
moderately severe disc space narrowing at C5-6 and C6-7. There is
slightly milder disc space narrowing at C7-T1 and T1-2. There is
multilevel facet hypertrophy. There is moderate exit foraminal
narrowing due to bony hypertrophy at C3-4 on the left, at C5-6
bilaterally, and at C6-7 bilaterally. There are foci of carotid
artery calcification bilaterally.
IMPRESSION: CT head: Moderate atrophy with extensive periventricular small
vessel disease. Prior infarct left temporal -occipital junction.
Prior small infarct inferior right cerebellum. No acute infarct
evident. Multiple foci of arterial vascular calcification noted.
There is a right frontal scalp hematoma. No fracture evident. No
intracranial mass, hemorrhage, or extra-axial fluid collection.

CT cervical spine: No acute fracture or spondylolisthesis.
Multilevel arthropathy. There is bilateral carotid artery
calcification.

## 2017-12-03 IMAGING — CR DG CHEST 2V
2 series · 2 of 2 positions shown · non-contrast
Comparison: October 04, 2015 and March 06, 2015

CLINICAL DATA: Pain following fall

EXAM:
CHEST  2 VIEW

[chest lat]
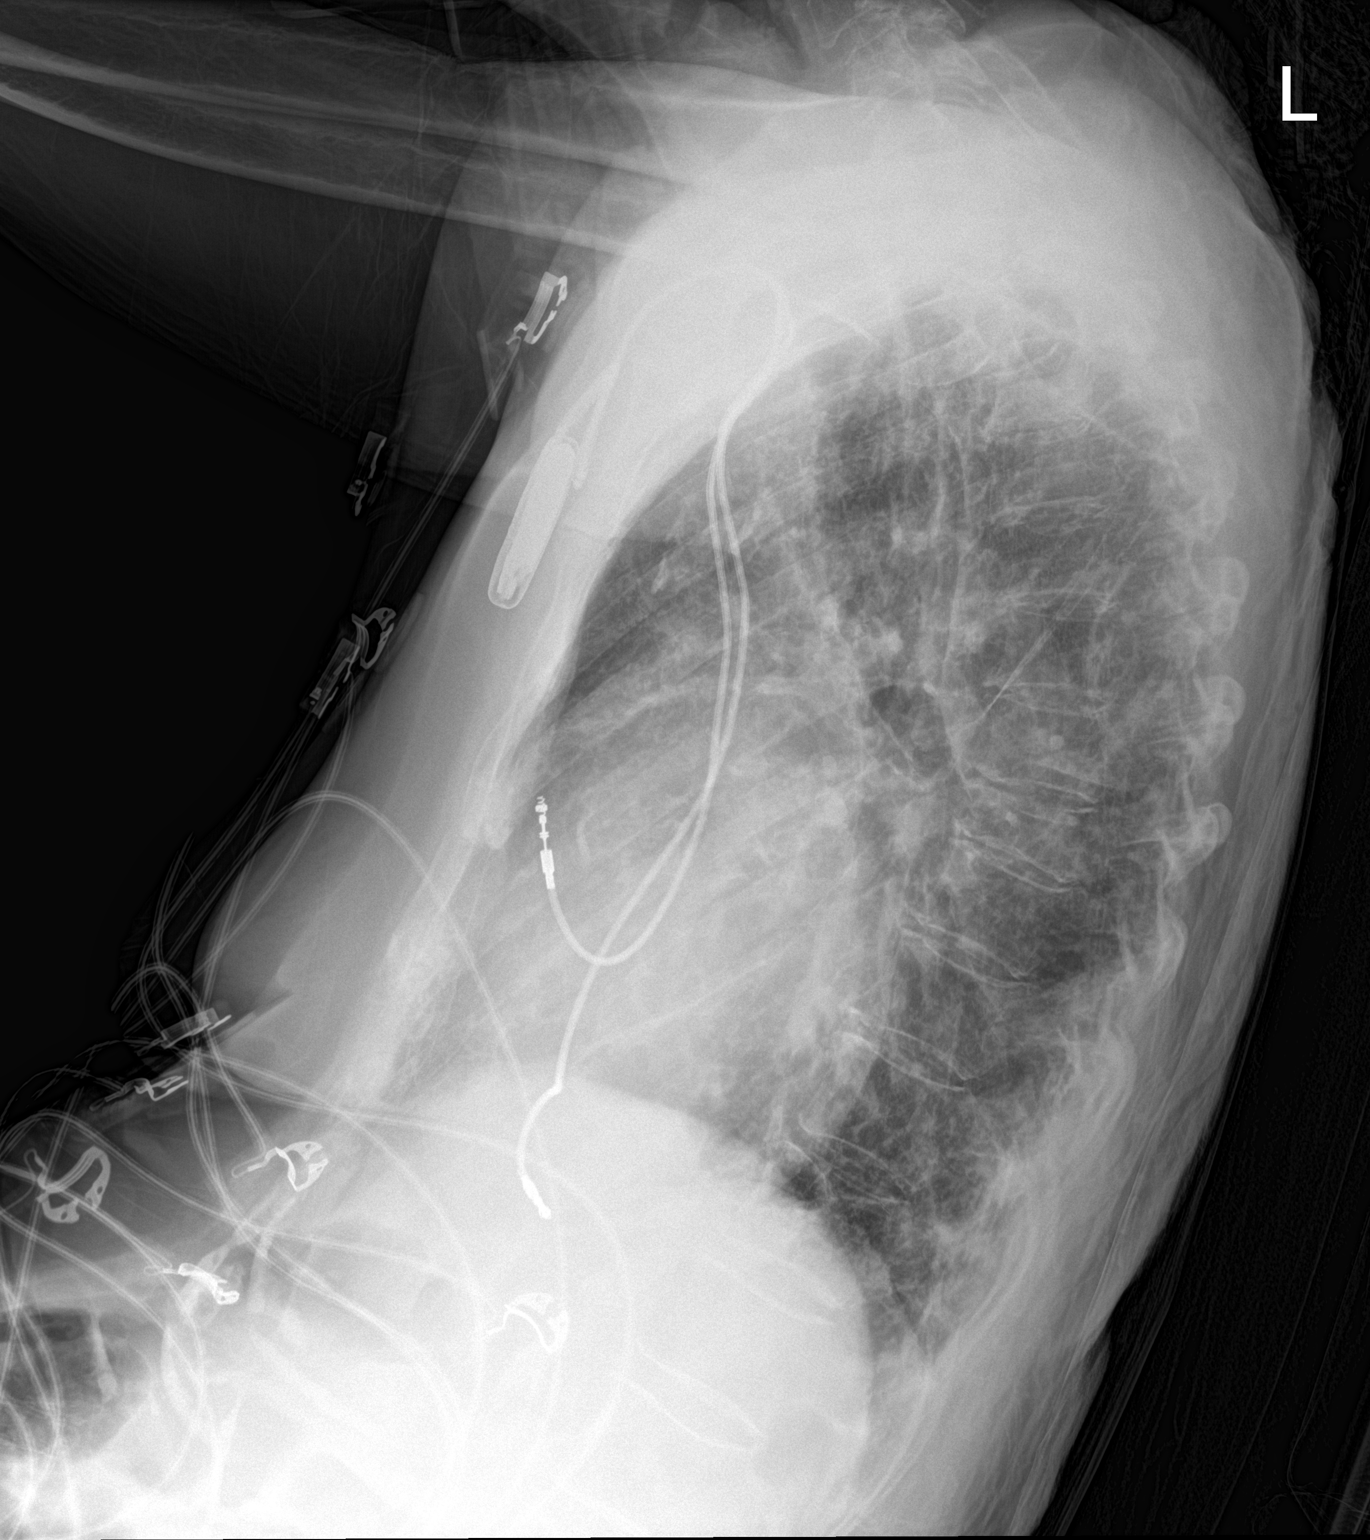

[chest ap]
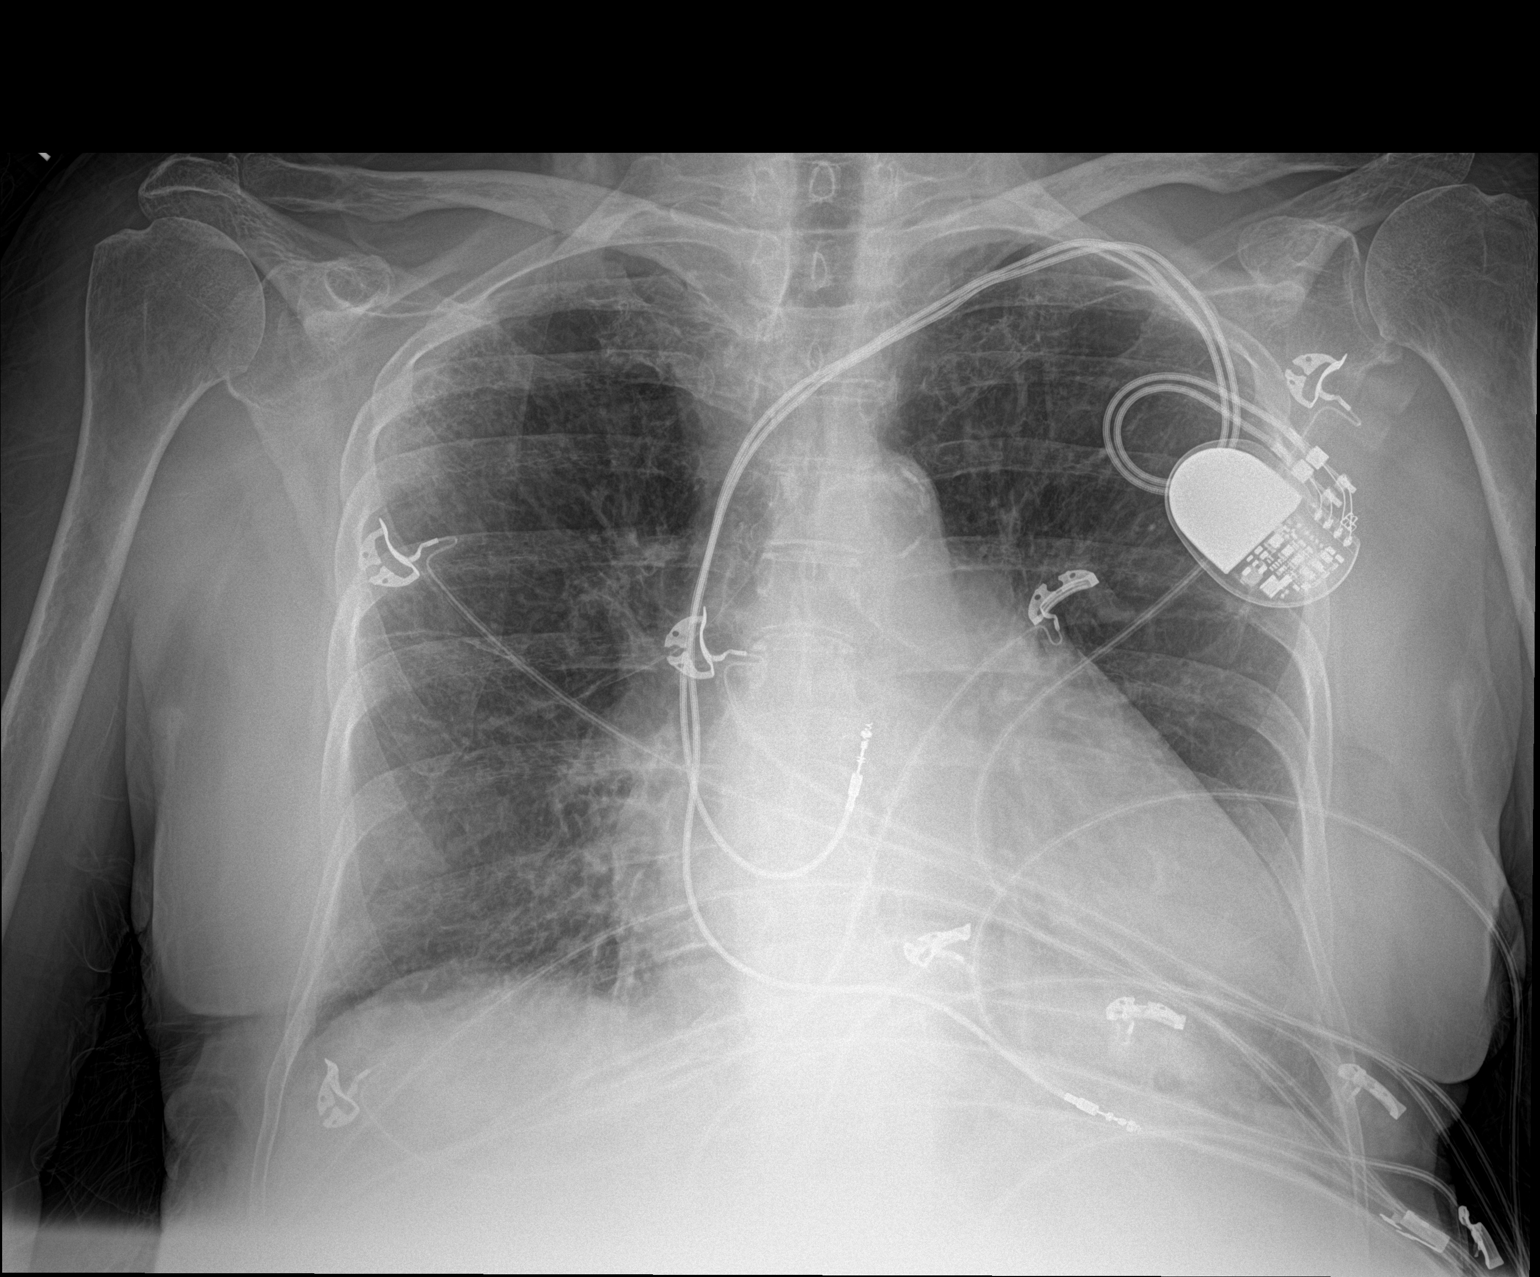

[2 of 2 positions shown; findings below may reference images not displayed]

FINDINGS: There is no edema or consolidation. There is cardiomegaly with
pulmonary vascularity within normal limits. Pacemaker leads are
attached to the right atrium and middle cardiac vein, stable. No
pneumothorax. No adenopathy. There is anterior wedging of a mid
thoracic vertebral body, stable. There is atherosclerotic
calcification in the aortic arch region.
IMPRESSION: No edema or consolidation. Stable cardiomegaly. Aortic
atherosclerosis.

## 2017-12-12 ENCOUNTER — Encounter
Admission: RE | Admit: 2017-12-12 | Discharge: 2017-12-12 | Disposition: A | Discharge status: Home / Self Care | Payer: Medicare Other | Source: Ambulatory Visit | Attending: Internal Medicine | Admitting: Internal Medicine

## 2017-12-12 ENCOUNTER — Non-Acute Institutional Stay (SKILLED_NURSING_FACILITY): Payer: Medicare Other | Admitting: Gerontology

## 2017-12-12 ENCOUNTER — Encounter: Payer: Self-pay | Admitting: Gerontology

## 2017-12-12 DIAGNOSIS — I63412 Cerebral infarction due to embolism of left middle cerebral artery: Secondary | ICD-10-CM

## 2017-12-12 DIAGNOSIS — I10 Essential (primary) hypertension: Secondary | ICD-10-CM | POA: Diagnosis not present

## 2017-12-12 DIAGNOSIS — I509 Heart failure, unspecified: Secondary | ICD-10-CM

## 2017-12-29 ENCOUNTER — Other Ambulatory Visit: Payer: Self-pay

## 2017-12-29 MED ORDER — OXYCODONE HCL 20 MG/ML PO CONC: ORAL | 0 refills | Status: DC

## 2017-12-29 MED ORDER — OXYCODONE HCL 20 MG/ML PO CONC: 10.0000 mg | Freq: Four times a day (QID) | ORAL | 0 refills | Status: DC

## 2017-12-29 NOTE — Telephone Encounter (Signed)
Rx sent to [facility] phone : [phone] , fax : [phone]  

## 2018-01-09 ENCOUNTER — Encounter
Admission: RE | Admit: 2018-01-09 | Discharge: 2018-01-09 | Disposition: A | Discharge status: Home / Self Care | Payer: Medicare Other | Source: Ambulatory Visit | Attending: Internal Medicine | Admitting: Internal Medicine

## 2018-01-15 ENCOUNTER — Non-Acute Institutional Stay (SKILLED_NURSING_FACILITY): Payer: Medicare Other | Admitting: Gerontology

## 2018-01-15 ENCOUNTER — Encounter: Payer: Self-pay | Admitting: Gerontology

## 2018-01-15 DIAGNOSIS — I480 Paroxysmal atrial fibrillation: Secondary | ICD-10-CM | POA: Diagnosis not present

## 2018-01-15 DIAGNOSIS — R1312 Dysphagia, oropharyngeal phase: Secondary | ICD-10-CM | POA: Diagnosis not present

## 2018-01-15 DIAGNOSIS — J449 Chronic obstructive pulmonary disease, unspecified: Secondary | ICD-10-CM | POA: Diagnosis not present

## 2018-01-19 NOTE — Assessment & Plan Note (Signed)
Stable. No recent episodes of increased symptoms. Pt is not on an anticoagulant d/t high fall risk. However, she is on Plavix 75 mg daily. Minimal defined residual deficits.

## 2018-01-19 NOTE — Assessment & Plan Note (Addendum)
Stable. No recent COPD exacerbations. Pt is not on continuous oxygen. Only intermittent need at this time. No obvious dyspnea or difficulty breathing. Lungs CTA. No tachypnea. No cough.

## 2018-01-19 NOTE — Assessment & Plan Note (Signed)
Stable. No recent episodes of hypotension. B/P's variable at times based on pain response. Not on antihypertensives for symptom management. Pt denies chest pain or shortness of breath.

## 2018-01-19 NOTE — Assessment & Plan Note (Signed)
Stable. No recent exacerbations. Not on rate controlling medications. Not on anticoagulant d/t fall risk

## 2018-01-19 NOTE — Progress Notes (Signed)
Location:    [facility]ursing Home Room Number: 325A Place of Service:  SNF (31) Provider:  [name], NP-C  [name], MD  Patient Care Team: [name], MD as PCP - General (Internal Medicine) [name], NP as Nurse Practitioner (Family Medicine)  Extended Emergency Contact Information Primary Emergency Contact: [name], [place] Phone: [phone] Relation: Daughter Secondary Emergency Contact: [name] Address: [address]          [place], [place] [address] [place] Home Phone: [phone] Mobile Phone: [phone] Relation: Daughter  Code Status:  DNR Goals of care: Advanced Directive information Advanced Directives 01/15/2018  Does Patient Have a Medical Advance Directive? Yes  Type of Advance Directive Out of facility DNR (pink MOST or yellow form);Healthcare Power of Attorney  Does patient want to make changes to medical advance directive? No - Patient declined  Copy of Healthcare Power of Attorney in Chart? Yes  Would patient like information on creating a medical advance directive? -  Pre-existing out of facility DNR order (yellow form or pink MOST form) Yellow form placed in chart (order not valid for inpatient use)     Chief Complaint  Patient presents with  . Medical Management of Chronic Issues    Routine Visit    HPI:  Pt is a 82 y.o. female seen today for medical management of chronic diseases.    Atrial fibrillation (HCC) Stable. No recent exacerbations. Not on rate controlling medications. Not on anticoagulant d/t fall risk  Dysphagia, oropharyngeal phase Stable. Pt is on Puree diet with nectar thick liquids. No recent episodes of aspiration or choking. Pt is ordered to get oral care with swabs BID.   COPD (chronic obstructive pulmonary disease) (HCC) Stable. No recent COPD exacerbations. Pt is not on continuous oxygen. Only intermittent need at this time.  No obvious dyspnea or difficulty breathing. Lungs CTA. No tachypnea. No cough.  Please note pt with limited verbal ability. Unable to obtain complete ROS. Some ROS info obtained from staff and documentation.     Past Medical History:  Diagnosis Date  . Anemia    "as a child" (08/05/2013)  . Anxiety   . Aphasia as late effect of stroke 04/18/2017  . Arthritis    "all over me; in all my joints" (08/05/2013)  . Atrial fibrillation (HCC)    a. Chronic; No longer on coumadin 2/2 frequent falls.  . Chest pain    a. 08/2011 Myoview: sm, fixed anteroseptal defect (attenuation), no ischemia, EF 62%.  . CHF (congestive heart failure) (HCC)   . Chronic atrial fibrillation (HCC) 04/18/2017  . Coccyx pain   . COPD (chronic obstructive pulmonary disease) (HCC)   . Dementia   . Dementia in Alzheimer's disease 04/18/2017  . Depression   . Diabetes mellitus type 2, controlled, without complications (HCC) 04/18/2017  . Falls frequently    coumadin stopped  . GERD (gastroesophageal reflux disease)   . Gout    "right foot" (08/05/2013)  . H/O hiatal hernia   . History of pneumonia    "couple times; long time ago" (08/05/2013)  . Hypercholesterolemia   . Hypertension   . Hypertrophic cardiomyopathy (HCC)    a. 11/2013 Echo: EF 55-60%, basal inf HK, sev dil LA, no evidence of HCM.  PASP .  [name] Hyponatremia   . Hyposmolality   . Osteoarthritis   . Oxygen dependent    "3L 24/7" (  08/05/2013)  . Pacemaker    a. 04/2012 MDT [id number] [employer] PPM, ser #: [id number] H.  . Pulmonary HTN (HCC)    a. has had prior right heart cath in 2010; was felt that most likely due to elevated left sided pressures and would not benefit from vasodilator therapy;  b. 11/2013 Echo: PASP .  [name] Simple chronic bronchitis (HCC) 04/18/2017  . Situational mixed anxiety and depressive disorder   . TIA (transient ischemic attack)   . Type II diabetes mellitus (HCC)   . Varicose veins    Past Surgical History:    Procedure Laterality Date  . ABDOMINAL HYSTERECTOMY     "partial" (08/05/2013)  . APPENDECTOMY    . BACK SURGERY     "bone spur removed; mid back" (08/05/2013)  . CARDIAC CATHETERIZATION     "more than once" (08/05/2013)  . DILATION AND CURETTAGE OF UTERUS     "several" (08/05/2013)  . FOOT NEUROMA SURGERY Right   . INSERT / REPLACE / REMOVE PACEMAKER  1998; 2000's; 2014  . PACEMAKER GENERATOR CHANGE N/A 04/24/2012   Procedure: PACEMAKER GENERATOR CHANGE;  Surgeon: Duke Salvia, MD;  Location: [facility] CATH [facility];  Service: Cardiovascular;  Laterality: N/A;  . TONSILLECTOMY    . TOTAL KNEE ARTHROPLASTY Right 2011    Allergies  Allergen Reactions  . Aspirin Nausea And Vomiting  . Ativan [Lorazepam] Other (See Comments)    Hallucinations  . Atorvastatin     Other reaction(s): Unknown  . Calan [Verapamil Hcl] Other (See Comments)    Patient states it makes her "out of her mind"; bp bottoms out (takes diltiazem at home)  . Codeine Nausea And Vomiting  . Crestor [Rosuvastatin Calcium] Other (See Comments)    Muscle pain  . Escitalopram Oxalate Other (See Comments)    Reaction Unknown   . Lactose Intolerance (Gi) Diarrhea    Can tolerate in small amounts  . Lexapro [Escitalopram] Other (See Comments)    Unknown   . Lipitor [Atorvastatin Calcium] Other (See Comments)    myalgia  . Lopressor [Metoprolol Tartrate] Other (See Comments)    Blood pressure bottoms out   . Metoprolol Tartrate Other (See Comments)    Unknown   . Nitroglycerin Other (See Comments)    Reaction unknown  . Norvasc [Amlodipine Besylate] Other (See Comments)    fatigue  . Nsaids Nausea Only  . Oxycodone Other (See Comments)    Reaction unknown  . Percocet [Oxycodone-Acetaminophen] Nausea And Vomiting  . Prednisolone     Other reaction(s): Unknown  . Prednisone Itching and Swelling  . Rosuvastatin     Other reaction(s): Unknown  . Sulfa Antibiotics Nausea Only and Other (See Comments)    Makes her  stomach hurt  . Tape Other (See Comments)    Skin tears & bruises easily (SKIN IS THIN!!)  . Verapamil     Other reaction(s): Unknown  . Vimovo [Naproxen-Esomeprazole] Itching, Nausea Only and Swelling  . Zoloft [Sertraline] Other (See Comments)    UNKNOWN   . Penicillins Hives and Rash    Has patient had a PCN reaction causing immediate rash, facial/tongue/throat swelling, SOB or lightheadedness with hypotension: Yes Has patient had a PCN reaction causing severe rash involving mucus membranes or skin necrosis: No Has patient had a PCN reaction that required hospitalization No Has patient had a PCN reaction occurring within the last 10 years: No If all of the above answers are "NO", then may proceed with Cephalosporin use.  Allergies as of 01/15/2018      Reactions   Aspirin Nausea And Vomiting   Ativan [lorazepam] Other (See Comments)   Hallucinations   Atorvastatin    Other reaction(s): Unknown   Calan [verapamil Hcl] Other (See Comments)   Patient states it makes her "out of her mind"; bp bottoms out (takes diltiazem at home)   Codeine Nausea And Vomiting   Crestor [rosuvastatin Calcium] Other (See Comments)   Muscle pain   Escitalopram Oxalate Other (See Comments)   Reaction Unknown   Lactose Intolerance (gi) Diarrhea   Can tolerate in small amounts   Lexapro [escitalopram] Other (See Comments)   Unknown   Lipitor [atorvastatin Calcium] Other (See Comments)   myalgia   Lopressor [metoprolol Tartrate] Other (See Comments)   Blood pressure bottoms out   Metoprolol Tartrate Other (See Comments)   Unknown   Nitroglycerin Other (See Comments)   Reaction unknown   Norvasc [amlodipine Besylate] Other (See Comments)   fatigue   Nsaids Nausea Only   Oxycodone Other (See Comments)   Reaction unknown   Percocet [oxycodone-acetaminophen] Nausea And Vomiting   Prednisolone    Other reaction(s): Unknown   Prednisone Itching, Swelling   Rosuvastatin    Other reaction(s):  Unknown   Sulfa Antibiotics Nausea Only, Other (See Comments)   Makes her stomach hurt   Tape Other (See Comments)   Skin tears & bruises easily (SKIN IS THIN!!)   Verapamil    Other reaction(s): Unknown   Vimovo [naproxen-esomeprazole] Itching, Nausea Only, Swelling   Zoloft [sertraline] Other (See Comments)   UNKNOWN   Penicillins Hives, Rash   Has patient had a PCN reaction causing immediate rash, facial/tongue/throat swelling, SOB or lightheadedness with hypotension: Yes Has patient had a PCN reaction causing severe rash involving mucus membranes or skin necrosis: No Has patient had a PCN reaction that required hospitalization No Has patient had a PCN reaction occurring within the last 10 years: No If all of the above answers are "NO", then may proceed with Cephalosporin use.      Medication List        Accurate as of 01/15/18 11:59 PM. Always use your most recent med list.          acetaminophen 325 MG tablet Commonly known as:  TYLENOL Take 650 mg by mouth 4 (four) times daily.   ALPRAZolam 0.25 MG tablet Commonly known as:  XANAX Take 1 tablet (0.25 mg total) by mouth 2 (two) times daily. At 9 am and 9 pm   alum & mag hydroxide-simeth 400-400-40 MG/5ML suspension Commonly known as:  MAALOX PLUS Take 30 mLs by mouth every 4 (four) hours as needed.   benzocaine 10 % mucosal gel Commonly known as:  ORAJEL Apply thin film to area of soreness on gums every 2 hours as needed   BETADINE SWABSTICKS 10 % Swab Generic drug:  Povidone-Iodine Scrub Sponge Apply 1 each topically daily. Swab necrotic toes with betadine sticks to help control bioburden   bisacodyl 10 MG suppository Commonly known as:  DULCOLAX Place 10 mg rectally daily as needed.   clopidogrel 75 MG tablet Commonly known as:  PLAVIX Take 1 tablet (75 mg total) by mouth daily.   DERMACLOUD Crea Apply liberal amount topically to area of skin irritation as needed. Ok to leave at bedside.   divalproex 125  MG capsule Commonly known as:  DEPAKOTE SPRINKLE Take 125 mg by mouth 3 (three) times daily with meals.   famotidine 40 MG  tablet Commonly known as:  PEPCID Take 40 mg by mouth daily. For use while on oxycodone   magnesium hydroxide 400 MG/5ML suspension Commonly known as:  MILK OF MAGNESIA Take 30 mLs by mouth every 4 (four) hours as needed. Constipation/ no BM for 2 days   mirtazapine 7.5 MG tablet Commonly known as:  REMERON Take 7.5 mg by mouth at bedtime.   oxybutynin 5 MG tablet Commonly known as:  DITROPAN Take 5 mg by mouth 2 (two) times daily.   oxyCODONE 20 MG/ML concentrated solution Commonly known as:  ROXICODONE INTENSOL Give 0.25 ml - 0.5 ml by mouth every 2 hours as needed for pain, dyspnea. Monitor for reaction. Per EPIC allergic reaction is GI upset.  0.25 - mild; 0.5 ml - moderate to severe   oxyCODONE 20 MG/ML concentrated solution Commonly known as:  ROXICODONE INTENSOL Take 0.5 mLs (10 mg total) by mouth 4 (four) times daily. For pain, dyspnea. Ok to give prn doses in addition to scheduled for severe pain. Monitor reaction. Per Epic, allergic reaction is GI upset.   senna 8.6 MG Tabs tablet Commonly known as:  SENOKOT Take 1 tablet by mouth 2 (two) times daily.       Review of Systems  Unable to perform ROS: Dementia  Constitutional: Negative for activity change, appetite change, chills, diaphoresis and fever.  HENT: Negative for congestion, mouth sores, nosebleeds, postnasal drip, sneezing, sore throat, trouble swallowing and voice change.   Respiratory: Negative for apnea, cough, choking, chest tightness, shortness of breath and wheezing.   Cardiovascular: Negative for chest pain, palpitations and leg swelling.  Gastrointestinal: Negative for abdominal distention, abdominal pain, constipation, diarrhea and nausea.  Genitourinary: Negative for difficulty urinating, dysuria, frequency and urgency.  Musculoskeletal: Positive for arthralgias (typical  arthritis). Negative for back pain, gait problem and myalgias.  Skin: Negative for color change, pallor, rash and wound.  Neurological: Negative for dizziness, tremors, syncope, speech difficulty, weakness, numbness and headaches.  Psychiatric/Behavioral: Negative for agitation and behavioral problems.  All other systems reviewed and are negative.   Immunization History  Administered Date(s) Administered  . Influenza Split 09/11/2012  . Influenza Whole 10/15/2010  . Influenza, High Dose Seasonal PF 08/20/2017  . Influenza-Unspecified 08/10/2013, 02/06/2016, 08/27/2016  . PPD Test 08/24/2013, 02/06/2016, 02/13/2017  . Pneumococcal Polysaccharide-23 10/15/2010  . Pneumococcal-Unspecified 08/10/2013, 02/06/2016  . Tdap 10/01/2013   Pertinent  Health Maintenance Due  Topic Date Due  . FOOT EXAM  06/18/1946  . OPHTHALMOLOGY EXAM  06/18/1946  . URINE MICROALBUMIN  06/18/1946  . PNA vac Low Risk Adult (2 of 2 - PCV13) 02/05/2017  . HEMOGLOBIN A1C  03/17/2017  . INFLUENZA VACCINE  Completed  . DEXA SCAN  Completed   Fall Risk  05/18/2013  Falls in the past year? Yes  Number falls in past yr: 1  Injury with Fall? No  Risk for fall due to : Impaired balance/gait;Impaired mobility   Functional Status Survey:    Vitals:   01/15/18 1549  BP: 138/70  Pulse: 90  Resp: 20  Temp: (!) 97.1 F (36.2 C)  TempSrc: Oral  SpO2: 95%  Weight: 107 lb 8 oz (48.8 kg)  Height: 5\' 6"  (1.676 m)   Body mass index is 17.35 kg/m. Physical Exam  Constitutional: Vital signs are normal. She appears well-developed and well-nourished. She is active and cooperative. She does not appear ill. No distress.  HENT:  Head: Normocephalic and atraumatic.  Mouth/Throat: Uvula is midline, oropharynx is clear and moist  and mucous membranes are normal. Mucous membranes are not pale, not dry and not cyanotic.  Eyes: Conjunctivae, EOM and lids are normal. Pupils are equal, round, and reactive to light.  Neck:  Trachea normal, normal range of motion and full passive range of motion without pain. Neck supple. No JVD present. No tracheal deviation, no edema and no erythema present. No thyromegaly present.  Cardiovascular: Normal rate, regular rhythm, normal heart sounds, intact distal pulses and normal pulses. Exam reveals no gallop, no distant heart sounds and no friction rub.  No murmur heard. Pulses:      Dorsalis pedis pulses are 2+ on the right side, and 2+ on the left side.  No edema  Pulmonary/Chest: Effort normal and breath sounds normal. No accessory muscle usage. No respiratory distress. She has no decreased breath sounds. She has no wheezes. She has no rhonchi. She has no rales. She exhibits no tenderness.  Abdominal: Soft. Normal appearance and bowel sounds are normal. She exhibits no distension and no ascites. There is no tenderness.  Musculoskeletal: Normal range of motion. She exhibits no edema or tenderness.  Expected osteoarthritis, stiffness; Bilateral Calves soft, supple. Negative Homan's Sign. B- pedal pulses equal; Right 3rd toe necrotic, progressing to the 4th. Generalized weakness  Neurological: She is alert. She has normal strength. She displays atrophy. A cranial nerve deficit and sensory deficit is present. She exhibits abnormal muscle tone. Coordination and gait abnormal.  Skin: Skin is warm, dry and intact. She is not diaphoretic. No cyanosis. No pallor. Nails show no clubbing.  Right 3rd and 4th toes necrotic  Psychiatric: She has a normal mood and affect. Thought content normal. She is withdrawn. Cognition and memory are impaired. She expresses impulsivity. She is noncommunicative. She exhibits abnormal recent memory and abnormal remote memory.  Nursing note and vitals reviewed.   Labs reviewed: No results for input(s): NA, K, CL, CO2, GLUCOSE, BUN, CREATININE, CALCIUM, MG, PHOS in the last 8760 hours. No results for input(s): AST, ALT, ALKPHOS, BILITOT, PROT, ALBUMIN in the  last 8760 hours. No results for input(s): WBC, NEUTROABS, HGB, HCT, MCV, PLT in the last 8760 hours. Lab Results  Component Value Date   TSH 1.674 02/07/2015   Lab Results  Component Value Date   HGBA1C 5.6 09/17/2016   Lab Results  Component Value Date   CHOL 288 (H) 09/17/2016   HDL 43 09/17/2016   LDLCALC 207 (H) 09/17/2016   TRIG 190 (H) 09/17/2016   CHOLHDL 6.7 09/17/2016    Significant Diagnostic Results in last 30 days:  No results found.  Assessment/Plan [place] was seen today for medical management of chronic issues.  Diagnoses and all orders for this visit:  Paroxysmal atrial fibrillation (HCC)  Dysphagia, oropharyngeal phase  Chronic obstructive pulmonary disease, unspecified COPD type (HCC)   Above listed conditions stable  Continue current medication regimen  Continue to encourage pt participation in activities and interaction with other residents  Continue skin/wound care per orders  Monitor for aspiration  Assist with meals and ADLs as appropriate  Full supervision with po intake, sitting upright  Monitor O2 sats with vital check s and prn  Continue care under Hospice services  Family/ staff Communication:   Total Time:  Documentation:  Face to Face:  Family/Phone:   Labs/tests ordered:  None- on Hospice care  Medication list reviewed and assessed for continued appropriateness. Monthly medication orders reviewed and signed.  Brynda Rim, NP-C Geriatrics [facility] [phone] [address] [place], [place] [address] Cell Phone (Mon-Fri 8am-5pm):  [phone] On Call:  [phone] & follow prompts after 5pm & weekends Office Phone:  [phone] Office Fax:  [phone]

## 2018-01-19 NOTE — Assessment & Plan Note (Signed)
Stable. Pt is on Puree diet with nectar thick liquids. No recent episodes of aspiration or choking. Pt is ordered to get oral care with swabs BID.

## 2018-01-19 NOTE — Progress Notes (Signed)
Location:    [facility] Home Room Number: 325A Place of Service:  SNF (31) Provider:  [name], NP-C  [name] Regulus, MD  Patient Care Team: [name] Regulus, MD as PCP - General (Internal Medicine) [name] Quarry, NP as Nurse Practitioner (Family Medicine)  Extended Emergency Contact Information Primary Emergency Contact: [name] [name], [name] [name] [place] Phone: [phone] Relation: Daughter Secondary Emergency Contact: [name] [name] Address: [address]          [name], [place] [address] [place] Home Phone: [phone] Mobile Phone: [phone] Relation: Daughter  Code Status:  DNR Goals of care: Advanced Directive information Advanced Directives 01/15/2018  Does Patient Have a Medical Advance Directive? Yes  Type of Advance Directive Out of facility DNR (pink MOST or yellow form);Healthcare Power of Attorney  Does patient want to make changes to medical advance directive? No - Patient declined  Copy of Healthcare Power of Attorney in Chart? Yes  Would patient like information on creating a medical advance directive? -  Pre-existing out of facility DNR order (yellow form or pink MOST form) Yellow form placed in chart (order not valid for inpatient use)     Chief Complaint  Patient presents with  . Medical Management of Chronic Issues    Routine Visit    HPI:  Pt is a 82 y.o. female seen today for medical management of chronic diseases.    Heart failure, unspecified (HCC) Stable. No recent CHF exacerbations. Pt's weight has remained stable. No medication management needed for symptoms. Pt denies chest pain or shortness of breath.  Essential hypertension Stable. No recent episodes of hypotension. B/P's variable at times based on pain response. Not on antihypertensives for symptom management. Pt denies chest pain or shortness of breath.   Cerebrovascular accident (CVA) due to embolism of left middle  cerebral artery (HCC) Stable. No recent episodes of increased symptoms. Pt is not on an anticoagulant d/t high fall risk. However, she is on Plavix 75 mg daily. Minimal defined residual deficits.   Please note pt with limited verbal ability. Unable to obtain complete ROS. Some ROS info obtained from staff and documentation.   Past Medical History:  Diagnosis Date  . Anemia    "as a child" (08/05/2013)  . Anxiety   . Aphasia as late effect of stroke 04/18/2017  . Arthritis    "all over me; in all my joints" (08/05/2013)  . Atrial fibrillation (HCC)    a. Chronic; No longer on coumadin 2/2 frequent falls.  . Chest pain    a. 08/2011 Myoview: sm, fixed anteroseptal defect (attenuation), no ischemia, EF 62%.  . CHF (congestive heart failure) (HCC)   . Chronic atrial fibrillation (HCC) 04/18/2017  . Coccyx pain   . COPD (chronic obstructive pulmonary disease) (HCC)   . Dementia   . Dementia in Alzheimer's disease 04/18/2017  . Depression   . Diabetes mellitus type 2, controlled, without complications (HCC) 04/18/2017  . Falls frequently    coumadin stopped  . GERD (gastroesophageal reflux disease)   . Gout    "right foot" (08/05/2013)  . H/O hiatal hernia   . History of pneumonia    "couple times; long time ago" (08/05/2013)  . Hypercholesterolemia   . Hypertension   . Hypertrophic cardiomyopathy (HCC)    a. 11/2013 Echo: EF 55-60%, basal inf HK, sev dil LA, no evidence of HCM.  PASP .  [name] Hyponatremia   .  Hyposmolality   . Osteoarthritis   . Oxygen dependent    "3L 24/7" (08/05/2013)  . Pacemaker    a. 04/2012 MDT [id number] [employer] PPM, ser #: [id number] H.  . Pulmonary HTN (HCC)    a. has had prior right heart cath in 2010; was felt that most likely due to elevated left sided pressures and would not benefit from vasodilator therapy;  b. 11/2013 Echo: PASP .  [name] Simple chronic bronchitis (HCC) 04/18/2017  . Situational mixed anxiety and depressive disorder   . TIA  (transient ischemic attack)   . Type II diabetes mellitus (HCC)   . Varicose veins    Past Surgical History:  Procedure Laterality Date  . ABDOMINAL HYSTERECTOMY     "partial" (08/05/2013)  . APPENDECTOMY    . BACK SURGERY     "bone spur removed; mid back" (08/05/2013)  . CARDIAC CATHETERIZATION     "more than once" (08/05/2013)  . DILATION AND CURETTAGE OF UTERUS     "several" (08/05/2013)  . FOOT NEUROMA SURGERY Right   . INSERT / REPLACE / REMOVE PACEMAKER  1998; 2000's; 2014  . PACEMAKER GENERATOR CHANGE N/A 04/24/2012   Procedure: PACEMAKER GENERATOR CHANGE;  Surgeon: Duke Salvia, MD;  Location: [facility] CATH LAB;  Service: Cardiovascular;  Laterality: N/A;  . TONSILLECTOMY    . TOTAL KNEE ARTHROPLASTY Right 2011    Allergies  Allergen Reactions  . Aspirin Nausea And Vomiting  . Ativan [Lorazepam] Other (See Comments)    Hallucinations  . Atorvastatin     Other reaction(s): Unknown  . Calan [Verapamil Hcl] Other (See Comments)    Patient states it makes her "out of her mind"; bp bottoms out (takes diltiazem at home)  . Codeine Nausea And Vomiting  . Crestor [Rosuvastatin Calcium] Other (See Comments)    Muscle pain  . Escitalopram Oxalate Other (See Comments)    Reaction Unknown   . Lactose Intolerance (Gi) Diarrhea    Can tolerate in small amounts  . Lexapro [Escitalopram] Other (See Comments)    Unknown   . Lipitor [Atorvastatin Calcium] Other (See Comments)    myalgia  . Lopressor [Metoprolol Tartrate] Other (See Comments)    Blood pressure bottoms out   . Metoprolol Tartrate Other (See Comments)    Unknown   . Nitroglycerin Other (See Comments)    Reaction unknown  . Norvasc [Amlodipine Besylate] Other (See Comments)    fatigue  . Nsaids Nausea Only  . Oxycodone Other (See Comments)    Reaction unknown  . Percocet [Oxycodone-Acetaminophen] Nausea And Vomiting  . Prednisolone     Other reaction(s): Unknown  . Prednisone Itching and Swelling  .  Rosuvastatin     Other reaction(s): Unknown  . Sulfa Antibiotics Nausea Only and Other (See Comments)    Makes her stomach hurt  . Tape Other (See Comments)    Skin tears & bruises easily (SKIN IS THIN!!)  . Verapamil     Other reaction(s): Unknown  . Vimovo [Naproxen-Esomeprazole] Itching, Nausea Only and Swelling  . Zoloft [Sertraline] Other (See Comments)    UNKNOWN   . Penicillins Hives and Rash    Has patient had a PCN reaction causing immediate rash, facial/tongue/throat swelling, SOB or lightheadedness with hypotension: Yes Has patient had a PCN reaction causing severe rash involving mucus membranes or skin necrosis: No Has patient had a PCN reaction that required hospitalization No Has patient had a PCN reaction occurring within the last 10 years: No If all of the  above answers are "NO", then may proceed with Cephalosporin use.     Allergies as of 12/12/2017      Reactions   Aspirin Nausea And Vomiting   Ativan [lorazepam] Other (See Comments)   Hallucinations   Atorvastatin    Other reaction(s): Unknown   Calan [verapamil Hcl] Other (See Comments)   Patient states it makes her "out of her mind"; bp bottoms out (takes diltiazem at home)   Codeine Nausea And Vomiting   Crestor [rosuvastatin Calcium] Other (See Comments)   Muscle pain   Escitalopram Oxalate Other (See Comments)   Reaction Unknown   Lactose Intolerance (gi) Diarrhea   Can tolerate in small amounts   Lexapro [escitalopram] Other (See Comments)   Unknown   Lipitor [atorvastatin Calcium] Other (See Comments)   myalgia   Lopressor [metoprolol Tartrate] Other (See Comments)   Blood pressure bottoms out   Metoprolol Tartrate Other (See Comments)   Unknown   Nitroglycerin Other (See Comments)   Reaction unknown   Norvasc [amlodipine Besylate] Other (See Comments)   fatigue   Nsaids Nausea Only   Oxycodone Other (See Comments)   Reaction unknown   Percocet [oxycodone-acetaminophen] Nausea And Vomiting     Prednisolone    Other reaction(s): Unknown   Prednisone Itching, Swelling   Rosuvastatin    Other reaction(s): Unknown   Sulfa Antibiotics Nausea Only, Other (See Comments)   Makes her stomach hurt   Tape Other (See Comments)   Skin tears & bruises easily (SKIN IS THIN!!)   Verapamil    Other reaction(s): Unknown   Vimovo [naproxen-esomeprazole] Itching, Nausea Only, Swelling   Zoloft [sertraline] Other (See Comments)   UNKNOWN   Penicillins Hives, Rash   Has patient had a PCN reaction causing immediate rash, facial/tongue/throat swelling, SOB or lightheadedness with hypotension: Yes Has patient had a PCN reaction causing severe rash involving mucus membranes or skin necrosis: No Has patient had a PCN reaction that required hospitalization No Has patient had a PCN reaction occurring within the last 10 years: No If all of the above answers are "NO", then may proceed with Cephalosporin use.      Medication List        Accurate as of 12/12/17 11:59 PM. Always use your most recent med list.          acetaminophen 325 MG tablet Commonly known as:  TYLENOL Take 650 mg by mouth 4 (four) times daily.   ALPRAZolam 0.25 MG tablet Commonly known as:  XANAX Take 1 tablet (0.25 mg total) by mouth 2 (two) times daily. At 9 am and 9 pm   alum & mag hydroxide-simeth 400-400-40 MG/5ML suspension Commonly known as:  MAALOX PLUS Take 30 mLs by mouth every 4 (four) hours as needed.   benzocaine 10 % mucosal gel Commonly known as:  ORAJEL Apply thin film to area of soreness on gums every 2 hours as needed   BETADINE SWABSTICKS 10 % Swab Generic drug:  Povidone-Iodine Scrub Sponge Apply 1 each topically daily. Swab necrotic toes with betadine sticks to help control bioburden   bisacodyl 10 MG suppository Commonly known as:  DULCOLAX Place 10 mg rectally daily as needed.   clopidogrel 75 MG tablet Commonly known as:  PLAVIX Take 1 tablet (75 mg total) by mouth daily.   DERMACLOUD  Crea Apply liberal amount topically to area of skin irritation as needed. Ok to leave at bedside.   divalproex 125 MG capsule Commonly known as:  DEPAKOTE SPRINKLE Take  125 mg by mouth 3 (three) times daily with meals.   famotidine 40 MG tablet Commonly known as:  PEPCID Take 40 mg by mouth daily. For use while on oxycodone   mirtazapine 7.5 MG tablet Commonly known as:  REMERON Take 7.5 mg by mouth at bedtime.   oxybutynin 5 MG tablet Commonly known as:  DITROPAN Take 5 mg by mouth 2 (two) times daily.   oxyCODONE 20 MG/ML concentrated solution Commonly known as:  ROXICODONE INTENSOL Give 0.25 ml - 0.5 ml by mouth every 2 hours as needed for pain, dyspnea. Monitor for reaction. Per EPIC allergic reaction is GI upset.  0.25 - mild; 0.5 ml - moderate to severe   oxyCODONE 20 MG/ML concentrated solution Commonly known as:  ROXICODONE INTENSOL Take 0.5 mLs (10 mg total) by mouth 4 (four) times daily. For pain, dyspnea. Ok to give prn doses in addition to scheduled for severe pain. Monitor reaction. Per Epic, allergic reaction is GI upset.   senna 8.6 MG Tabs tablet Commonly known as:  SENOKOT Take 1 tablet by mouth 2 (two) times daily.       Review of Systems  Unable to perform ROS: Dementia  Constitutional: Negative for activity change, appetite change, chills, diaphoresis and fever.  HENT: Negative for congestion, mouth sores, nosebleeds, postnasal drip, sneezing, sore throat, trouble swallowing and voice change.   Respiratory: Negative for apnea, cough, choking, chest tightness, shortness of breath and wheezing.   Cardiovascular: Negative for chest pain, palpitations and leg swelling.  Gastrointestinal: Negative for abdominal distention, abdominal pain, constipation, diarrhea and nausea.  Genitourinary: Negative for difficulty urinating, dysuria, frequency and urgency.  Musculoskeletal: Positive for arthralgias (typical arthritis) and gait problem. Negative for back pain  and myalgias.  Skin: Positive for wound. Negative for color change, pallor and rash.  Neurological: Negative for dizziness, tremors, syncope, speech difficulty, weakness, numbness and headaches.  Psychiatric/Behavioral: Positive for confusion. Negative for agitation and behavioral problems.  All other systems reviewed and are negative.   Immunization History  Administered Date(s) Administered  . Influenza Split 09/11/2012  . Influenza Whole 10/15/2010  . Influenza, High Dose Seasonal PF 08/20/2017  . Influenza-Unspecified 08/10/2013, 02/06/2016, 08/27/2016  . PPD Test 08/24/2013, 02/06/2016, 02/13/2017  . Pneumococcal Polysaccharide-23 10/15/2010  . Pneumococcal-Unspecified 08/10/2013, 02/06/2016  . Tdap 10/01/2013   Pertinent  Health Maintenance Due  Topic Date Due  . FOOT EXAM  06/18/1946  . OPHTHALMOLOGY EXAM  06/18/1946  . URINE MICROALBUMIN  06/18/1946  . PNA vac Low Risk Adult (2 of 2 - PCV13) 02/05/2017  . HEMOGLOBIN A1C  03/17/2017  . INFLUENZA VACCINE  Completed  . DEXA SCAN  Completed   Fall Risk  05/18/2013  Falls in the past year? Yes  Number falls in past yr: 1  Injury with Fall? No  Risk for fall due to : Impaired balance/gait;Impaired mobility   Functional Status Survey:    Vitals:   12/12/17 1013  BP: 139/68  Pulse: 60  Resp: 18  Temp: 98.1 F (36.7 C)  TempSrc: Oral  SpO2: 100%  Weight: 106 lb (48.1 kg)  Height: 5\' 6"  (1.676 m)   Body mass index is 17.11 kg/m. Physical Exam  Constitutional: Vital signs are normal. She appears well-developed and well-nourished. She is active and cooperative. She does not appear ill. No distress.  HENT:  Head: Normocephalic and atraumatic.  Mouth/Throat: Uvula is midline, oropharynx is clear and moist and mucous membranes are normal. Mucous membranes are not pale, not dry and  not cyanotic.  Eyes: Conjunctivae, EOM and lids are normal. Pupils are equal, round, and reactive to light.  Neck: Trachea normal, normal  range of motion and full passive range of motion without pain. Neck supple. No JVD present. No tracheal deviation, no edema and no erythema present. No thyromegaly present.  Cardiovascular: Normal rate, regular rhythm, normal heart sounds and intact distal pulses. Exam reveals no gallop, no distant heart sounds and no friction rub.  No murmur heard. Pulses:      Dorsalis pedis pulses are 0 on the right side, and 1+ on the left side.  No edema, PVD Right leg worse than left. 3rd toe necrotic  Pulmonary/Chest: Effort normal and breath sounds normal. No accessory muscle usage. No respiratory distress. She has no decreased breath sounds. She has no wheezes. She has no rhonchi. She has no rales. She exhibits no tenderness.  Abdominal: Soft. Normal appearance and bowel sounds are normal. She exhibits no distension and no ascites. There is no tenderness.  Musculoskeletal: Normal range of motion. She exhibits no edema or tenderness.       Right foot: There is swelling and decreased capillary refill.       Feet:  Expected osteoarthritis, stiffness; Bilateral Calves soft, supple. Negative Homan's Sign. B- pedal pulses equal. Right third toe necrotic  Neurological: She is alert. She has normal strength. She displays atrophy. A cranial nerve deficit and sensory deficit is present. She exhibits abnormal muscle tone. Coordination and gait abnormal.  Skin: Skin is warm, dry and intact. She is not diaphoretic. No cyanosis. No pallor. Nails show no clubbing.  Right third toe necrotic  Psychiatric: She has a normal mood and affect. Her behavior is normal. Thought content normal. Cognition and memory are impaired. She expresses impulsivity. She is noncommunicative. She exhibits abnormal recent memory and abnormal remote memory.  Nursing note and vitals reviewed.   Labs reviewed: No results for input(s): NA, K, CL, CO2, GLUCOSE, BUN, CREATININE, CALCIUM, MG, PHOS in the last 8760 hours. No results for input(s):  AST, ALT, ALKPHOS, BILITOT, PROT, ALBUMIN in the last 8760 hours. No results for input(s): WBC, NEUTROABS, HGB, HCT, MCV, PLT in the last 8760 hours. Lab Results  Component Value Date   TSH 1.674 02/07/2015   Lab Results  Component Value Date   HGBA1C 5.6 09/17/2016   Lab Results  Component Value Date   CHOL 288 (H) 09/17/2016   HDL 43 09/17/2016   LDLCALC 207 (H) 09/17/2016   TRIG 190 (H) 09/17/2016   CHOLHDL 6.7 09/17/2016    Significant Diagnostic Results in last 30 days:  No results found.  Assessment/Plan Ashlyn was seen today for medical management of chronic issues.  Diagnoses and all orders for this visit:  Heart failure, unspecified HF chronicity, unspecified heart failure type [facility](HCC)  Essential hypertension  Cerebrovascular accident (CVA) due to embolism of left middle cerebral artery (HCC)   Above listed conditions stable  Continue current medication regimen  Continue to encourage pt participation in activities  Have pt in the common areas as much as tolerated for stimulation  Continue routine weighing monthly and prn  Assist with ADLs and feeding as appropriate  Continue care under Hospice services  Family/ staff Communication:   Total Time:  Documentation:  Face to Face:  Family/Phone:   Labs/tests ordered:  Pt on Hospice care  Medication list reviewed and assessed for continued appropriateness. Monthly medication orders reviewed and signed.  Brynda RimShannon H. Haliyah Fryman, NP-C Geriatrics [facility] [address], [place] [address] Cell Phone (Mon-Fri 8am-5pm):  [phone] On Call:  [phone] & follow prompts after 5pm & weekends Office Phone:  [phone] Office Fax:  [phone]

## 2018-01-19 NOTE — Assessment & Plan Note (Signed)
Stable. No recent CHF exacerbations. Pt's weight has remained stable. No medication management needed for symptoms. Pt denies chest pain or shortness of breath.

## 2018-02-09 ENCOUNTER — Encounter
Admission: RE | Admit: 2018-02-09 | Discharge: 2018-02-09 | Disposition: A | Discharge status: Home / Self Care | Payer: Medicare Other | Source: Ambulatory Visit | Attending: Internal Medicine | Admitting: Internal Medicine

## 2018-02-16 ENCOUNTER — Encounter: Payer: Self-pay | Admitting: Gerontology

## 2018-02-16 ENCOUNTER — Non-Acute Institutional Stay (SKILLED_NURSING_FACILITY): Payer: Medicare Other | Admitting: Gerontology

## 2018-02-16 ENCOUNTER — Other Ambulatory Visit: Payer: Self-pay

## 2018-02-16 DIAGNOSIS — I69391 Dysphagia following cerebral infarction: Secondary | ICD-10-CM | POA: Diagnosis not present

## 2018-02-16 DIAGNOSIS — K219 Gastro-esophageal reflux disease without esophagitis: Secondary | ICD-10-CM | POA: Diagnosis not present

## 2018-02-16 DIAGNOSIS — J309 Allergic rhinitis, unspecified: Secondary | ICD-10-CM

## 2018-02-16 MED ORDER — OXYCODONE HCL 20 MG/ML PO CONC: ORAL | 0 refills | Status: DC

## 2018-02-16 MED ORDER — OXYCODONE HCL 20 MG/ML PO CONC: 10.0000 mg | Freq: Four times a day (QID) | ORAL | 0 refills | Status: DC

## 2018-02-16 NOTE — Assessment & Plan Note (Signed)
Stable. Pt currently on pureed diet with nectar thick fluids. No recent episodes of choking or aspiration

## 2018-02-16 NOTE — Assessment & Plan Note (Signed)
Stable. No s/s of worsening condition. Symptoms controlled without the use of daily medications

## 2018-02-16 NOTE — Telephone Encounter (Signed)
Rx sent to [facility] phone : [phone] , fax : [phone]  

## 2018-02-16 NOTE — Progress Notes (Signed)
Location:    Nursing Home Room Number: 325A Place of Service:  SNF (31) Provider:  [name], NP-C  [name], MD  Patient Care Team: [name], MD as PCP - General (Internal Medicine) [name], NP as Nurse Practitioner (Family Medicine)  Extended Emergency Contact Information Primary Emergency Contact: [name], [place] Phone: [phone] Relation: Daughter Secondary Emergency Contact: [name] Address: [address]          [place], [place] [address] [place] Home Phone: [phone] Mobile Phone: [phone] Relation: Daughter  Code Status:  DNR Goals of care: Advanced Directive information Advanced Directives 02/16/2018  Does Patient Have a Medical Advance Directive? Yes  Type of Estate agent of [place];Out of facility DNR (pink MOST or yellow form)  Does patient want to make changes to medical advance directive? No - Patient declined  Copy of Healthcare Power of Attorney in Chart? Yes  Would patient like information on creating a medical advance directive? -  Pre-existing out of facility DNR order (yellow form or pink MOST form) Yellow form placed in chart (order not valid for inpatient use)     Chief Complaint  Patient presents with  . Medical Management of Chronic Issues    Routine Visit    HPI:  Pt is a 82 y.o. female seen today for medical management of chronic diseases.    Allergic rhinitis Stable. No s/s of worsening condition. Symptoms controlled without the use of daily medications  Gastroesophageal reflux disease without esophagitis Stable. No complaint of worsening symptoms. Symptoms managed with pepcid 40 mg Q Day and prn Maalox  Dysphagia following cerebral infarction Stable. Pt currently on pureed diet with nectar thick fluids. No recent episodes of choking or aspiration  Please note pt with limited verbal/cognitive ability.  Unable to obtain complete ROS. Some ROS info obtained from staff and documentation.   Past Medical History:  Diagnosis Date  . Anemia    "as a child" (08/05/2013)  . Anxiety   . Aphasia as late effect of stroke 04/18/2017  . Arthritis    "all over me; in all my joints" (08/05/2013)  . Atrial fibrillation (HCC)    a. Chronic; No longer on coumadin 2/2 frequent falls.  . Chest pain    a. 08/2011 Myoview: sm, fixed anteroseptal defect (attenuation), no ischemia, EF 62%.  . CHF (congestive heart failure) (HCC)   . Chronic atrial fibrillation (HCC) 04/18/2017  . Coccyx pain   . COPD (chronic obstructive pulmonary disease) (HCC)   . Dementia   . Dementia in Alzheimer's disease 04/18/2017  . Depression   . Diabetes mellitus type 2, controlled, without complications (HCC) 04/18/2017  . Falls frequently    coumadin stopped  . GERD (gastroesophageal reflux disease)   . Gout    "right foot" (08/05/2013)  . H/O hiatal hernia   . History of pneumonia    "couple times; long time ago" (08/05/2013)  . Hypercholesterolemia   . Hypertension   . Hypertrophic cardiomyopathy (HCC)    a. 11/2013 Echo: EF 55-60%, basal inf HK, sev dil LA, no evidence of HCM.  PASP .  [name] Hyponatremia   . Hyposmolality   . Osteoarthritis   . Oxygen dependent    "3L 24/7" (08/05/2013)  . Pacemaker    a. 04/2012 MDT [id number] [employer] PPM, ser #: [id number] H.  . Pulmonary HTN (HCC)    a. has  had prior right heart cath in 2010; was felt that most likely due to elevated left sided pressures and would not benefit from vasodilator therapy;  b. 11/2013 Echo: PASP .  [name] Simple chronic bronchitis (HCC) 04/18/2017  . Situational mixed anxiety and depressive disorder   . TIA (transient ischemic attack)   . Type II diabetes mellitus (HCC)   . Varicose veins    Past Surgical History:  Procedure Laterality Date  . ABDOMINAL HYSTERECTOMY     "partial" (08/05/2013)  . APPENDECTOMY    . BACK SURGERY     "bone spur  removed; mid back" (08/05/2013)  . CARDIAC CATHETERIZATION     "more than once" (08/05/2013)  . DILATION AND CURETTAGE OF UTERUS     "several" (08/05/2013)  . FOOT NEUROMA SURGERY Right   . INSERT / REPLACE / REMOVE PACEMAKER  1998; 2000's; 2014  . PACEMAKER GENERATOR CHANGE N/A 04/24/2012   Procedure: PACEMAKER GENERATOR CHANGE;  Surgeon: Duke Salvia, MD;  Location: [facility] CATH LAB;  Service: Cardiovascular;  Laterality: N/A;  . TONSILLECTOMY    . TOTAL KNEE ARTHROPLASTY Right 2011    Allergies  Allergen Reactions  . Aspirin Nausea And Vomiting  . Ativan [Lorazepam] Other (See Comments)    Hallucinations  . Atorvastatin     Other reaction(s): Unknown  . Calan [Verapamil Hcl] Other (See Comments)    Patient states it makes her "out of her mind"; bp bottoms out (takes diltiazem at home)  . Codeine Nausea And Vomiting  . Crestor [Rosuvastatin Calcium] Other (See Comments)    Muscle pain  . Escitalopram Oxalate Other (See Comments)    Reaction Unknown   . Lactose Intolerance (Gi) Diarrhea    Can tolerate in small amounts  . Lexapro [Escitalopram] Other (See Comments)    Unknown   . Lipitor [Atorvastatin Calcium] Other (See Comments)    myalgia  . Lopressor [Metoprolol Tartrate] Other (See Comments)    Blood pressure bottoms out   . Metoprolol Tartrate Other (See Comments)    Unknown   . Nitroglycerin Other (See Comments)    Reaction unknown  . Norvasc [Amlodipine Besylate] Other (See Comments)    fatigue  . Nsaids Nausea Only  . Oxycodone Other (See Comments)    Reaction unknown  . Percocet [Oxycodone-Acetaminophen] Nausea And Vomiting  . Prednisolone     Other reaction(s): Unknown  . Prednisone Itching and Swelling  . Rosuvastatin     Other reaction(s): Unknown  . Sulfa Antibiotics Nausea Only and Other (See Comments)    Makes her stomach hurt  . Tape Other (See Comments)    Skin tears & bruises easily (SKIN IS THIN!!)  . Verapamil     Other reaction(s): Unknown    . Vimovo [Naproxen-Esomeprazole] Itching, Nausea Only and Swelling  . Zoloft [Sertraline] Other (See Comments)    UNKNOWN   . Penicillins Hives and Rash    Has patient had a PCN reaction causing immediate rash, facial/tongue/throat swelling, SOB or lightheadedness with hypotension: Yes Has patient had a PCN reaction causing severe rash involving mucus membranes or skin necrosis: No Has patient had a PCN reaction that required hospitalization No Has patient had a PCN reaction occurring within the last 10 years: No If all of the above answers are "NO", then may proceed with Cephalosporin use.     Allergies as of 02/16/2018      Reactions   Aspirin Nausea And Vomiting   Ativan [lorazepam] Other (See Comments)   Hallucinations  Atorvastatin    Other reaction(s): Unknown   Calan [verapamil Hcl] Other (See Comments)   Patient states it makes her "out of her mind"; bp bottoms out (takes diltiazem at home)   Codeine Nausea And Vomiting   Crestor [rosuvastatin Calcium] Other (See Comments)   Muscle pain   Escitalopram Oxalate Other (See Comments)   Reaction Unknown   Lactose Intolerance (gi) Diarrhea   Can tolerate in small amounts   Lexapro [escitalopram] Other (See Comments)   Unknown   Lipitor [atorvastatin Calcium] Other (See Comments)   myalgia   Lopressor [metoprolol Tartrate] Other (See Comments)   Blood pressure bottoms out   Metoprolol Tartrate Other (See Comments)   Unknown   Nitroglycerin Other (See Comments)   Reaction unknown   Norvasc [amlodipine Besylate] Other (See Comments)   fatigue   Nsaids Nausea Only   Oxycodone Other (See Comments)   Reaction unknown   Percocet [oxycodone-acetaminophen] Nausea And Vomiting   Prednisolone    Other reaction(s): Unknown   Prednisone Itching, Swelling   Rosuvastatin    Other reaction(s): Unknown   Sulfa Antibiotics Nausea Only, Other (See Comments)   Makes her stomach hurt   Tape Other (See Comments)   Skin tears &  bruises easily (SKIN IS THIN!!)   Verapamil    Other reaction(s): Unknown   Vimovo [naproxen-esomeprazole] Itching, Nausea Only, Swelling   Zoloft [sertraline] Other (See Comments)   UNKNOWN   Penicillins Hives, Rash   Has patient had a PCN reaction causing immediate rash, facial/tongue/throat swelling, SOB or lightheadedness with hypotension: Yes Has patient had a PCN reaction causing severe rash involving mucus membranes or skin necrosis: No Has patient had a PCN reaction that required hospitalization No Has patient had a PCN reaction occurring within the last 10 years: No If all of the above answers are "NO", then may proceed with Cephalosporin use.      Medication List        Accurate as of 02/16/18 10:18 PM. Always use your most recent med list.          acetaminophen 325 MG tablet Commonly known as:  TYLENOL Take 650 mg by mouth 4 (four) times daily.   ALPRAZolam 0.25 MG tablet Commonly known as:  XANAX Take 1 tablet (0.25 mg total) by mouth 2 (two) times daily. At 9 am and 9 pm   alum & mag hydroxide-simeth 400-400-40 MG/5ML suspension Commonly known as:  MAALOX PLUS Take 30 mLs by mouth every 4 (four) hours as needed.   benzocaine 10 % mucosal gel Commonly known as:  ORAJEL Apply thin film to area of soreness on gums every 2 hours as needed   BETADINE SWABSTICKS 10 % Swab Generic drug:  Povidone-Iodine Scrub Sponge Apply 1 each topically daily. Swab necrotic toes with betadine sticks to help control bioburden.  Cover with 4x4 gauze for protection   bisacodyl 10 MG suppository Commonly known as:  DULCOLAX Place 10 mg rectally daily as needed.   clopidogrel 75 MG tablet Commonly known as:  PLAVIX Take 1 tablet (75 mg total) by mouth daily.   DERMACLOUD Crea Apply liberal amount topically to area of skin irritation as needed. Ok to leave at bedside.   divalproex 125 MG capsule Commonly known as:  DEPAKOTE SPRINKLE Take 125 mg by mouth 3 (three) times daily  with meals. DX: Mania/mood stabilization Give with meals   famotidine 40 MG tablet Commonly known as:  PEPCID Take 40 mg by mouth daily. For use while  on oxycodone   loperamide 2 MG tablet Commonly known as:  IMODIUM A-D Give 2 tablets (4 mg) by mouth  with first loose stool, then 1 tablet (2 mg) with each subsequent loose stool up to 8 doses in 24 hours.   magnesium hydroxide 400 MG/5ML suspension Commonly known as:  MILK OF MAGNESIA Take 30 mLs by mouth every 4 (four) hours as needed. Constipation/ no BM for 2 days   mirtazapine 7.5 MG tablet Commonly known as:  REMERON Take 7.5 mg by mouth at bedtime.   oxybutynin 5 MG tablet Commonly known as:  DITROPAN Take 5 mg by mouth 2 (two) times daily.   oxyCODONE 20 MG/ML concentrated solution Commonly known as:  ROXICODONE INTENSOL Give 0.25 ml - 0.5 ml by mouth every 2 hours as needed for pain, dyspnea. Monitor for reaction. Per EPIC allergic reaction is GI upset.  0.25 - mild; 0.5 ml - moderate to severe   oxyCODONE 20 MG/ML concentrated solution Commonly known as:  ROXICODONE INTENSOL Take 0.5 mLs (10 mg total) by mouth 4 (four) times daily. For pain, dyspnea. Ok to give prn doses in addition to scheduled for severe pain. Monitor reaction. Per Epic, allergic reaction is GI upset.   senna 8.6 MG Tabs tablet Commonly known as:  SENOKOT Take 1 tablet by mouth 2 (two) times daily.       Review of Systems  Unable to perform ROS: Dementia  Constitutional: Negative for activity change, appetite change, chills, diaphoresis and fever.  HENT: Negative for congestion, mouth sores, nosebleeds, postnasal drip, sneezing, sore throat, trouble swallowing and voice change.   Respiratory: Negative for apnea, cough, choking, chest tightness, shortness of breath and wheezing.   Cardiovascular: Negative for chest pain, palpitations and leg swelling.  Gastrointestinal: Negative for abdominal distention, abdominal pain, constipation, diarrhea and  nausea.  Genitourinary: Negative for difficulty urinating, dysuria, frequency and urgency.  Musculoskeletal: Positive for arthralgias (typical arthritis), gait problem and myalgias. Negative for back pain.  Skin: Positive for wound. Negative for color change, pallor and rash.  Neurological: Negative for dizziness, tremors, syncope, speech difficulty, weakness, numbness and headaches.  Psychiatric/Behavioral: Positive for confusion. Negative for agitation and behavioral problems.  All other systems reviewed and are negative.   Immunization History  Administered Date(s) Administered  . Influenza Split 09/11/2012  . Influenza Whole 10/15/2010  . Influenza, High Dose Seasonal PF 08/20/2017  . Influenza-Unspecified 08/10/2013, 02/06/2016, 08/27/2016  . PPD Test 08/24/2013, 02/06/2016, 02/13/2017  . Pneumococcal Polysaccharide-23 10/15/2010  . Pneumococcal-Unspecified 08/10/2013, 02/06/2016  . Tdap 10/01/2013   Pertinent  Health Maintenance Due  Topic Date Due  . FOOT EXAM  06/18/1946  . OPHTHALMOLOGY EXAM  06/18/1946  . URINE MICROALBUMIN  06/18/1946  . PNA vac Low Risk Adult (2 of 2 - PCV13) 02/05/2017  . HEMOGLOBIN A1C  03/17/2017  . INFLUENZA VACCINE  06/11/2018  . DEXA SCAN  Completed   Fall Risk  05/18/2013  Falls in the past year? Yes  Number falls in past yr: 1  Injury with Fall? No  Risk for fall due to : Impaired balance/gait;Impaired mobility   Functional Status Survey:    Vitals:   02/16/18 1424  BP: (!) 154/80  Pulse: 88  Resp: 20  Temp: (!) 97 F (36.1 C)  TempSrc: Oral  SpO2: 96%  Weight: 96 lb 12.8 oz (43.9 kg)  Height: 5\' 6"  (1.676 m)   Body mass index is 15.62 kg/m. Physical Exam  Constitutional: Vital signs are normal. She appears  well-developed and well-nourished. She appears lethargic. She is active and cooperative. She does not appear ill. No distress.  HENT:  Head: Normocephalic and atraumatic.  Mouth/Throat: Uvula is midline, oropharynx is  clear and moist and mucous membranes are normal. Mucous membranes are not pale, not dry and not cyanotic.  Eyes: Pupils are equal, round, and reactive to light. Conjunctivae, EOM and lids are normal.  Neck: Trachea normal, normal range of motion and full passive range of motion without pain. Neck supple. No JVD present. No tracheal deviation, no edema and no erythema present. No thyromegaly present.  Cardiovascular: Normal rate, normal heart sounds and intact distal pulses. An irregular rhythm present. Exam reveals no gallop, no distant heart sounds and no friction rub.  No murmur heard. Pulses:      Dorsalis pedis pulses are Detected w/ doppler on the right side, and 2+ on the left side.  No edema  Pulmonary/Chest: Effort normal and breath sounds normal. No accessory muscle usage. No respiratory distress. She has no decreased breath sounds. She has no wheezes. She has no rhonchi. She has no rales. She exhibits no tenderness.  Abdominal: Soft. Normal appearance and bowel sounds are normal. She exhibits no distension and no ascites. There is no tenderness.  Musculoskeletal: Normal range of motion. She exhibits no edema or tenderness.  Expected osteoarthritis, stiffness; Bilateral Calves soft, supple. Negative Homan's Sign. B- pedal pulses equal; generalized weakness  Feet:  Right Foot:  Skin Integrity: Positive for skin breakdown (multiple necrotic toes).  Neurological: She has normal strength. She appears lethargic. She displays atrophy. A cranial nerve deficit and sensory deficit is present. She exhibits abnormal muscle tone. Coordination and gait abnormal.  Skin: Skin is warm, dry and intact. She is not diaphoretic. No cyanosis. No pallor. Nails show no clubbing.  Multiple necrotic toes on the right foot  Psychiatric: She has a normal mood and affect. Judgment and thought content normal. Her speech is delayed. She is slowed and withdrawn. Cognition and memory are impaired. She exhibits abnormal  recent memory and abnormal remote memory.  Nursing note and vitals reviewed.   Labs reviewed: No results for input(s): NA, K, CL, CO2, GLUCOSE, BUN, CREATININE, CALCIUM, MG, PHOS in the last 8760 hours. No results for input(s): AST, ALT, ALKPHOS, BILITOT, PROT, ALBUMIN in the last 8760 hours. No results for input(s): WBC, NEUTROABS, HGB, HCT, MCV, PLT in the last 8760 hours. Lab Results  Component Value Date   TSH 1.674 02/07/2015   Lab Results  Component Value Date   HGBA1C 5.6 09/17/2016   Lab Results  Component Value Date   CHOL 288 (H) 09/17/2016   HDL 43 09/17/2016   LDLCALC 207 (H) 09/17/2016   TRIG 190 (H) 09/17/2016   CHOLHDL 6.7 09/17/2016    Significant Diagnostic Results in last 30 days:  No results found.  Assessment/Plan [place] was seen today for medical management of chronic issues.  Diagnoses and all orders for this visit:  Allergic rhinitis, unspecified seasonality, unspecified trigger  Gastroesophageal reflux disease without esophagitis  Dysphagia following cerebral infarction   Above listed conditions stable  Continue current medication regimen  Supervision with meals  Monitor for aspiration  Monitor for reflux  Assist with meals as necessary  Continue Hospice services for EOL care  Family/ staff Communication:   Total Time:  Documentation:  Face to Face:  Family/Phone:   Labs/tests ordered: not due- on Hospice services  Medication list reviewed and assessed for continued appropriateness. Monthly medication orders reviewed  and signed.  Vikki Ports, NP-C Geriatrics [facility] [phone], [place] [address] Cell Phone (Mon-Fri 8am-5pm):  [phone] On Call:  [phone] & follow prompts after 5pm & weekends Office Phone:  [phone] Office Fax:  [phone]

## 2018-02-16 NOTE — Assessment & Plan Note (Signed)
Stable. No complaint of worsening symptoms. Symptoms managed with pepcid 40 mg Q Day and prn Maalox

## 2018-02-25 ENCOUNTER — Other Ambulatory Visit: Payer: Self-pay

## 2018-02-25 MED ORDER — FENTANYL 12 MCG/HR TD PT72: 12.5000 ug | MEDICATED_PATCH | TRANSDERMAL | 0 refills | Status: DC

## 2018-02-25 NOTE — Telephone Encounter (Signed)
Rx sent to [facility] phone : [phone] , fax : [phone]  

## 2018-03-04 ENCOUNTER — Other Ambulatory Visit: Payer: Self-pay

## 2018-03-04 MED ORDER — FENTANYL 25 MCG/HR TD PT72: 25.0000 ug | MEDICATED_PATCH | TRANSDERMAL | 0 refills | Status: DC

## 2018-03-04 NOTE — Telephone Encounter (Signed)
Rx sent to [facility] phone : [phone] , fax : [phone]  

## 2018-03-05 ENCOUNTER — Other Ambulatory Visit
Admission: RE | Admit: 2018-03-05 | Discharge: 2018-03-05 | Disposition: A | Discharge status: Home / Self Care | Source: Skilled Nursing Facility | Attending: Internal Medicine | Admitting: Internal Medicine

## 2018-03-05 DIAGNOSIS — R4182 Altered mental status, unspecified: Secondary | ICD-10-CM | POA: Insufficient documentation

## 2018-03-05 LAB — URINALYSIS, COMPLETE (UACMP) WITH MICROSCOPIC
Bilirubin Urine: NEGATIVE
Glucose, UA: NEGATIVE mg/dL
Hgb urine dipstick: NEGATIVE
Ketones, ur: 5 mg/dL — AB
Leukocytes, UA: NEGATIVE
Nitrite: POSITIVE — AB
PH: 6 (ref 5.0–8.0)
Protein, ur: NEGATIVE mg/dL
SPECIFIC GRAVITY, URINE: 1.018 (ref 1.005–1.030)

## 2018-03-07 LAB — URINE CULTURE: Culture: >=100000 — AB

## 2018-03-11 ENCOUNTER — Other Ambulatory Visit: Payer: Self-pay

## 2018-03-11 ENCOUNTER — Encounter
Admission: RE | Admit: 2018-03-11 | Discharge: 2018-03-11 | Disposition: A | Discharge status: Home / Self Care | Payer: Medicare Other | Source: Ambulatory Visit | Attending: Internal Medicine | Admitting: Internal Medicine

## 2018-03-11 MED ORDER — ALPRAZOLAM 0.25 MG PO TABS: 0.2500 mg | ORAL_TABLET | Freq: Three times a day (TID) | ORAL | 3 refills | Status: AC

## 2018-03-11 NOTE — Telephone Encounter (Signed)
Rx sent to [facility] phone : [phone] , fax : [phone]  

## 2018-03-16 IMAGING — CT CT HEAD CODE STROKE
3 series · 14 of 47 positions shown, 16 images · non-contrast
Comparison: 06/05/2016 CT head.

EXAM:
CT HEAD WITHOUT CONTRAST
TECHNIQUE: Contiguous axial images were obtained from the base of the skull
through the vertex without intravenous contrast.

[Series 2: head 5.0 h30s · axial · 0.42mm/px · z∈[+141,+276]mm · 8 of 33 slices shown, 10 images]
[im 3/33  brain]
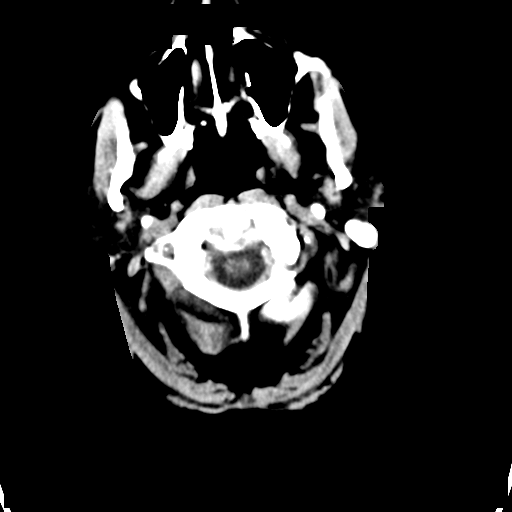
[im 3/33  bone]
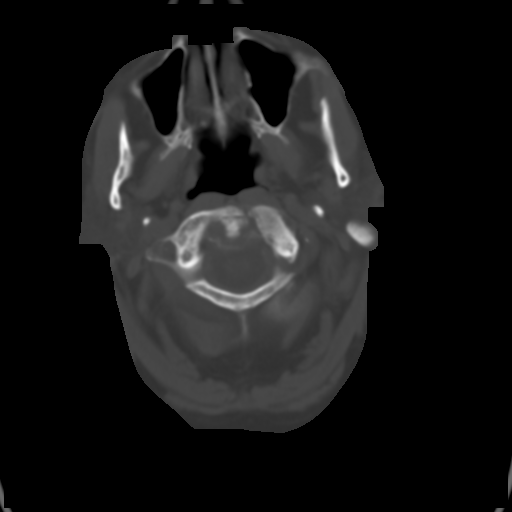
[im 7/33  brain]
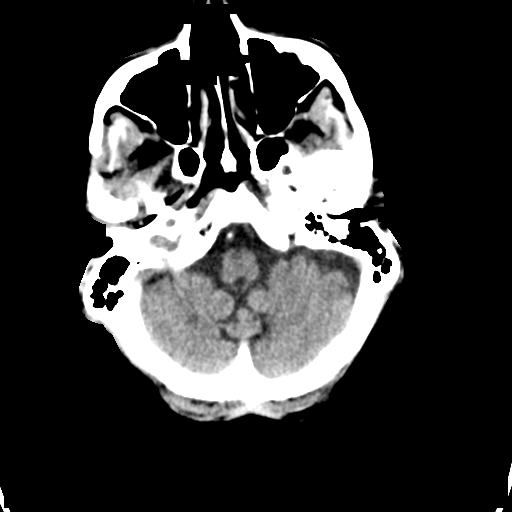
[im 10/33  brain]
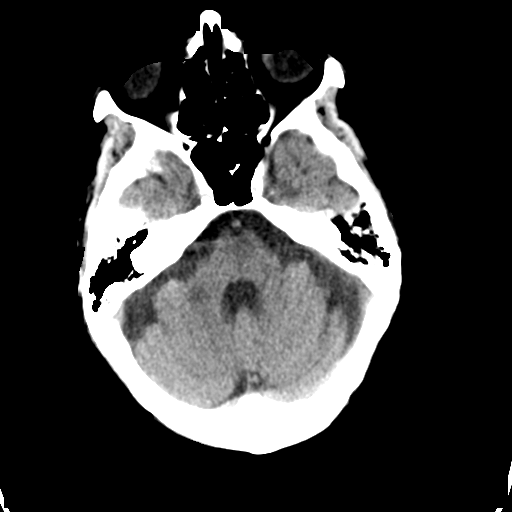
[im 15/33  brain]
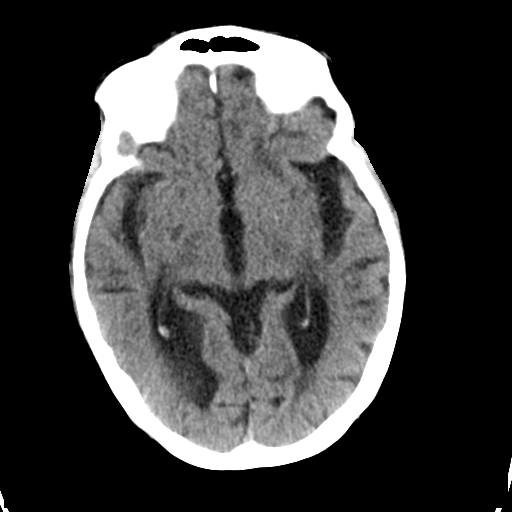
[im 18/33  brain]
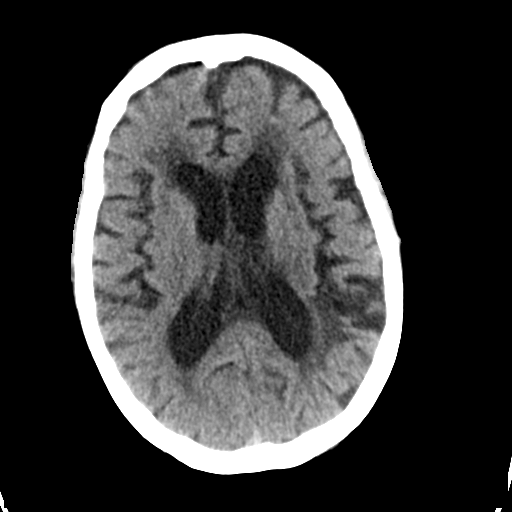
[im 18/33  bone]
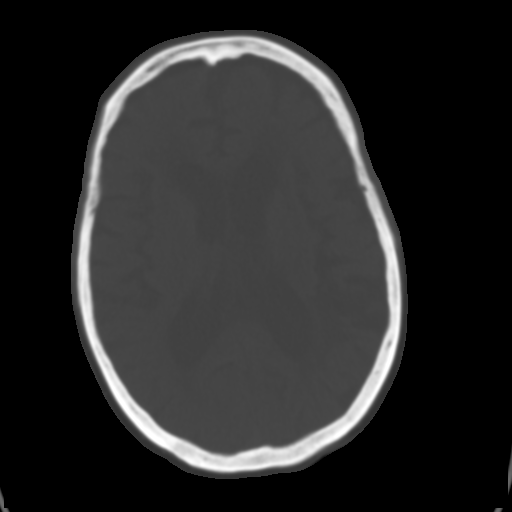
[im 23/33  brain]
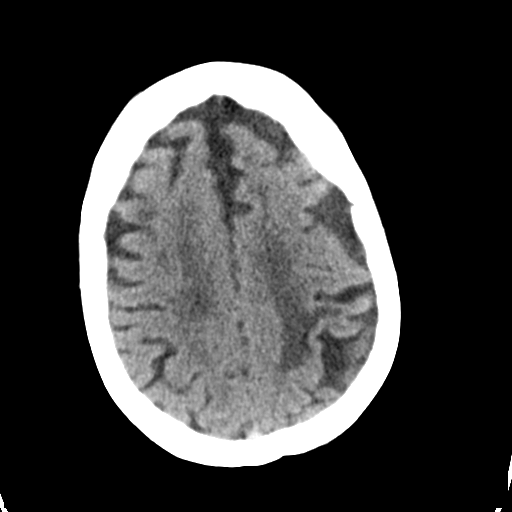
[im 26/33  brain]
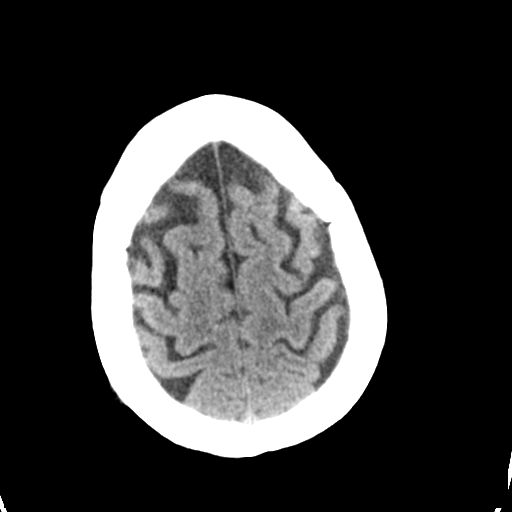
[im 30/33  brain]
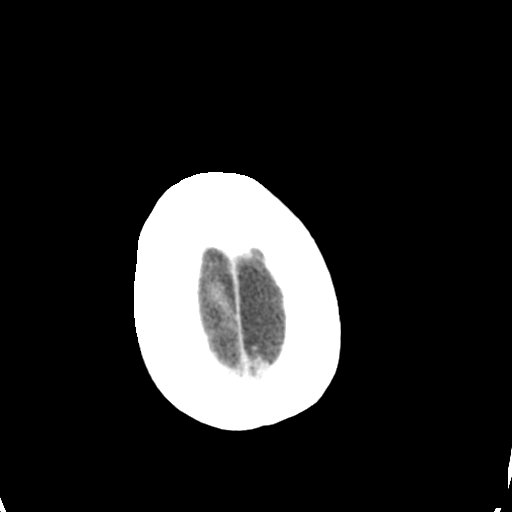

[Series 4: head 3.0 mpr · coronal · 0.31mm/px · 3 of 66 slices shown (1 of 2)]
[im 22/66  brain]
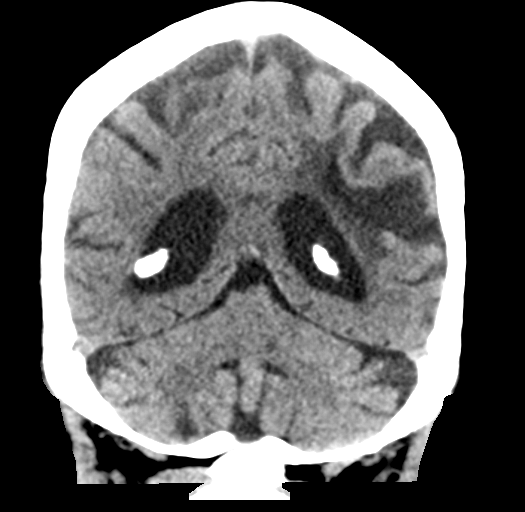
[im 29/66  brain]
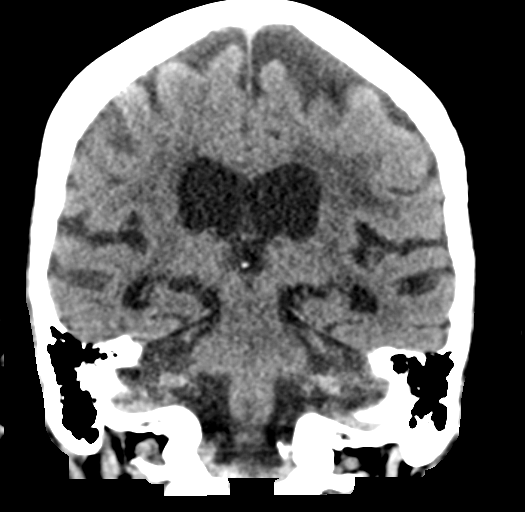
[im 37/66  brain]
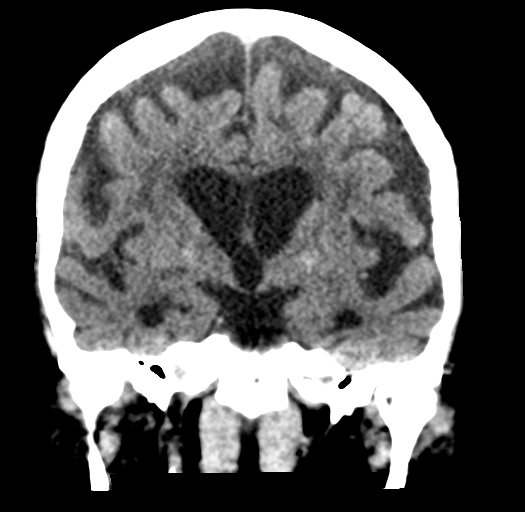

[Series 5: head 3.0 mpr · sagittal · 0.31mm/px · 3 of 48 slices shown (2 of 2)]
[im 16/48  brain]
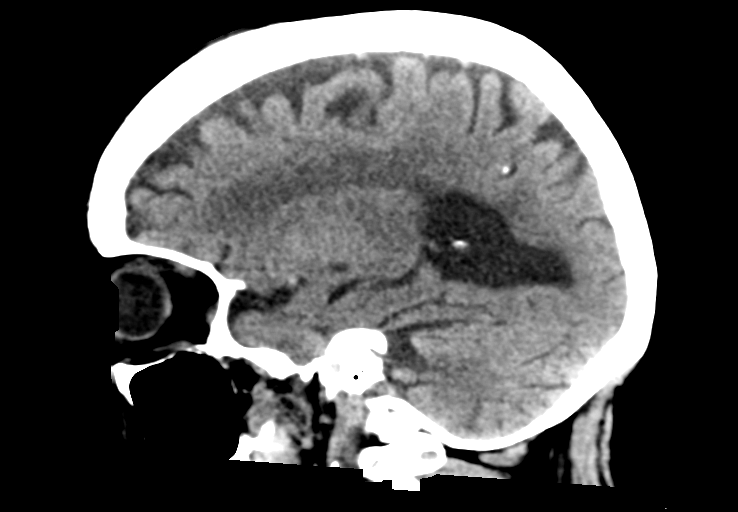
[im 24/48  brain]
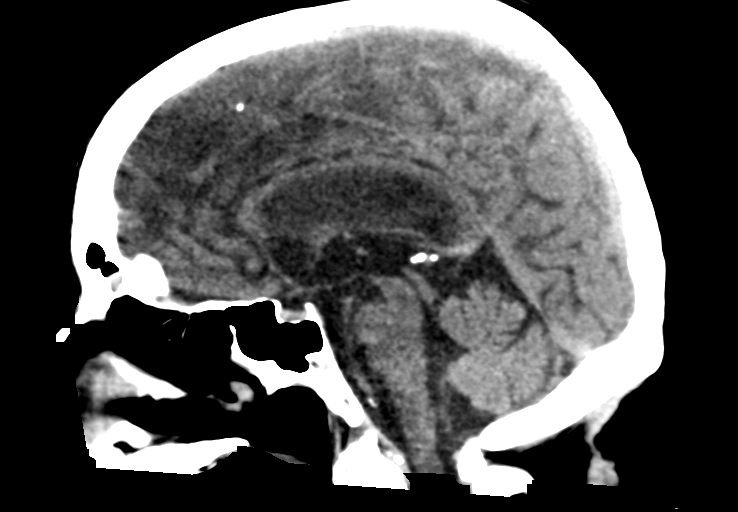
[im 32/48  brain]
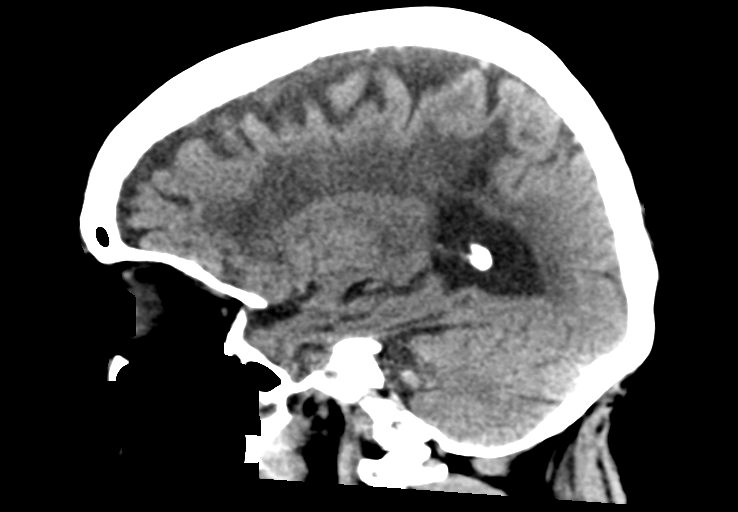

[14 of 47 positions shown; findings below may reference images not displayed]

FINDINGS: Brain: No evidence of large acute territory infarct, focal mass
effect, or intracranial hemorrhage. Stable chronic left parietal
infarct. Stable right putamen, right caudate head, and right
anterior insula chronic lacunar infarcts. Stable small chronic
infarct within the right cerebellar hemisphere. Stable background of
exam chronic microvascular ischemic changes and moderate parenchymal
volume loss. No extra-axial collection identified. Nonspecific right
parietal cortical calcifications are unchanged.

Vascular: Calcific atherosclerosis of the cavernous internal carotid
arteries and vertebral arteries.

Skull: Normal. Negative for fracture or focal lesion.

Sinuses/Orbits: Right frontal sinus mucous retention cyst. Otherwise
the visualized paranasal sinuses and mastoids are clear.

Other: None.

ASPECTS (Alberta Stroke Program Early CT Score)

- Ganglionic level infarction (caudate, lentiform nuclei, internal
capsule, insula, M1-M3 cortex): 7

- Supraganglionic infarction (M4-M6 cortex): 3

Total score (0-10 with 10 being normal): 10
IMPRESSION: 1. No evidence of large acute infarct, mass effect, or intracranial
hemorrhage and symptoms persist or if clinically indicated MRI is
more sensitive for acute stroke.
2. ASPECTS is 10
3. Stable background of small chronic infarcts, advanced chronic
microvascular ischemic changes, and moderate parenchymal volume
loss.
These results were called by telephone at the time of interpretation
on 09/16/2016 at [DATE] to Dr. DIPTI RIDDELL , who verbally acknowledged
these results.

By: Qomandan Tiger M.D.

## 2018-03-18 ENCOUNTER — Other Ambulatory Visit: Payer: Self-pay

## 2018-03-18 IMAGING — CT CT HEAD W/O CM
4 series · 26 of 47 positions shown, 30 images · non-contrast
Comparison: CT 09/16/2016

CLINICAL DATA: Stroke.

EXAM:
CT HEAD WITHOUT CONTRAST
TECHNIQUE: Contiguous axial images were obtained from the base of the skull
through the vertex without intravenous contrast.

[Series 201: head w/o, idose (1) · axial · non-contrast · 0.52mm/px · z∈[+176,+314]mm · 13 of 36 slices shown, 17 images]
[im 3/36  brain]
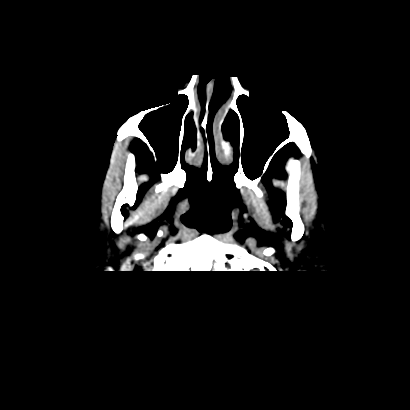
[im 3/36  bone]
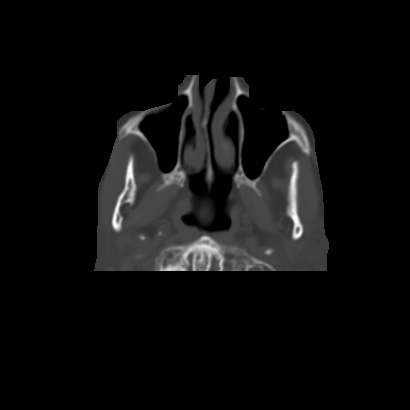
[im 6/36  brain]
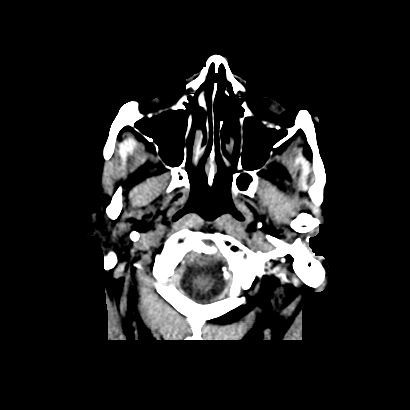
[im 8/36  brain]
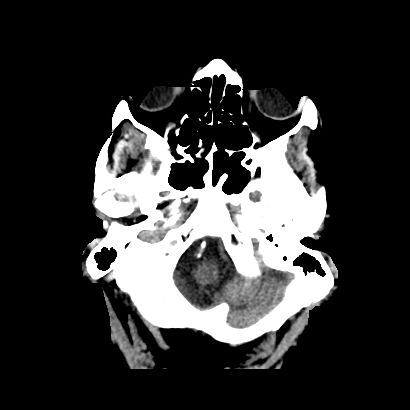
[im 11/36  brain]
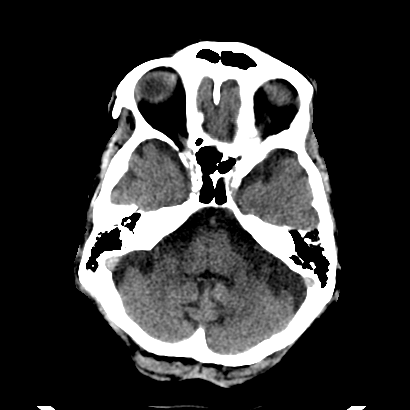
[im 13/36  brain]
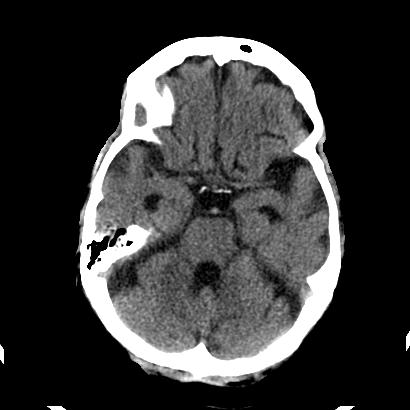
[im 13/36  bone]
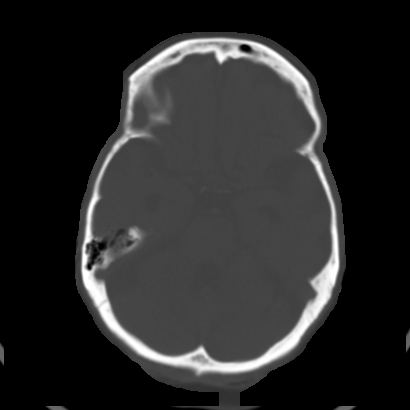
[im 16/36  brain]
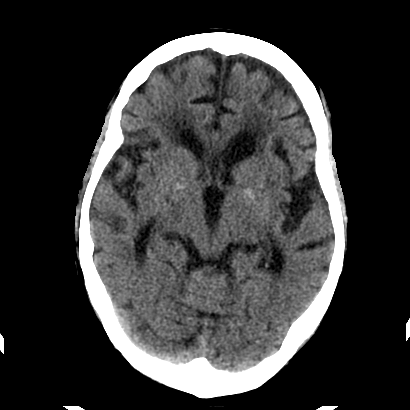
[im 18/36  brain]
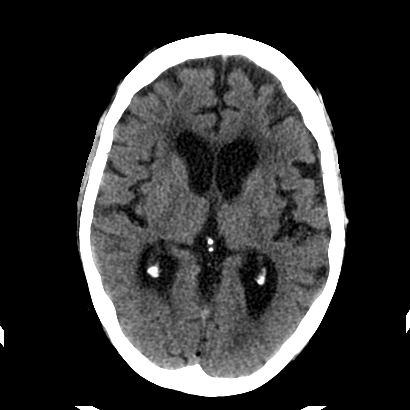
[im 21/36  brain]
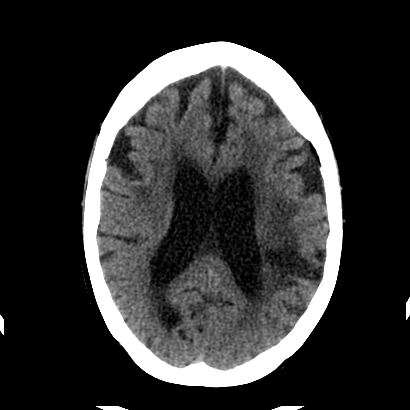
[im 23/36  brain]
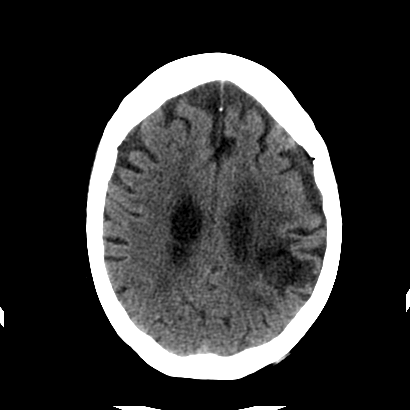
[im 23/36  bone]
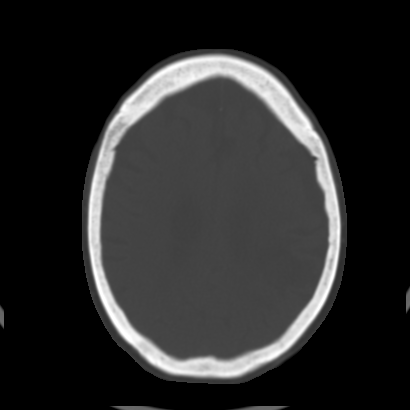
[im 26/36  brain]
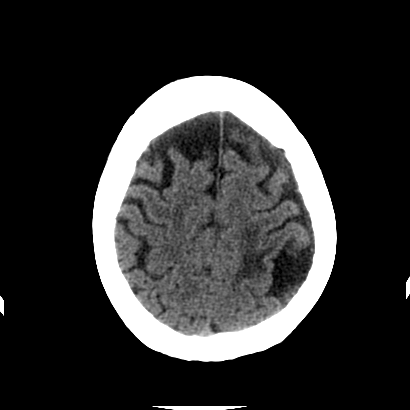
[im 28/36  brain]
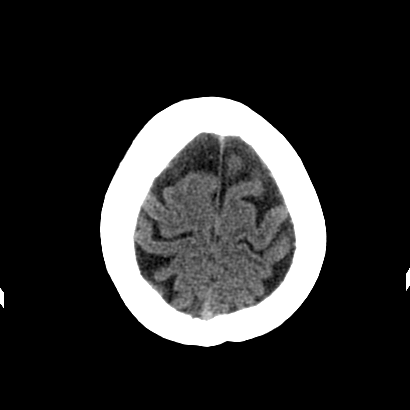
[im 31/36  brain]
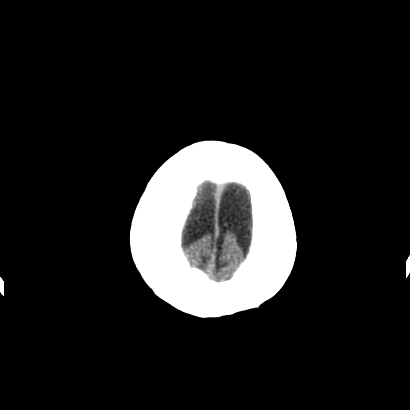
[im 33/36  brain]
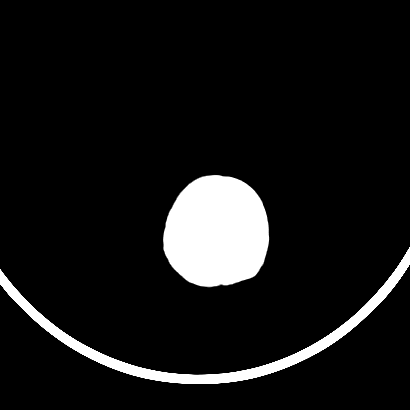
[im 33/36  bone]
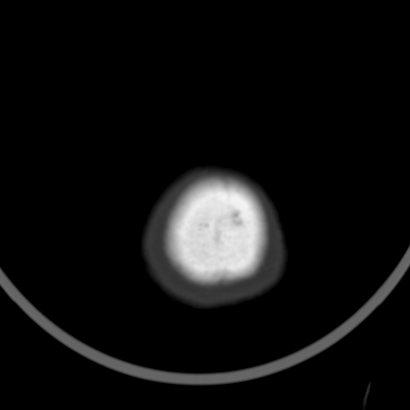

[Series 202: head w/o bone, idose (1) · axial · non-contrast · 0.52mm/px · z∈[+176,+291]mm · 7 of 36 slices shown]
[im 3/36  bone]
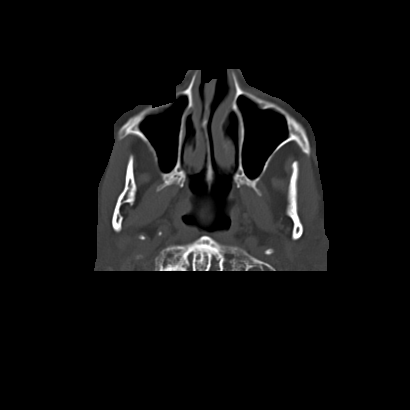
[im 8/36  bone]
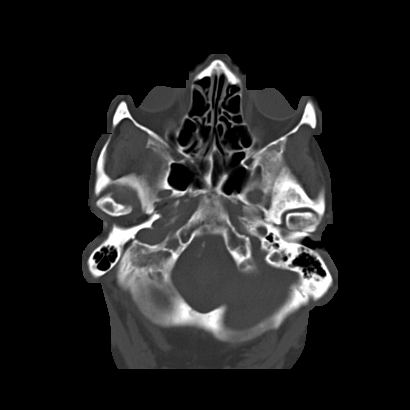
[im 13/36  bone]
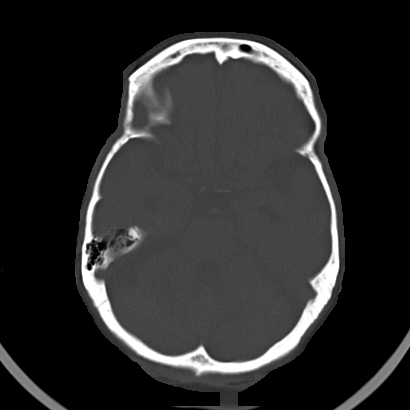
[im 16/36  bone]
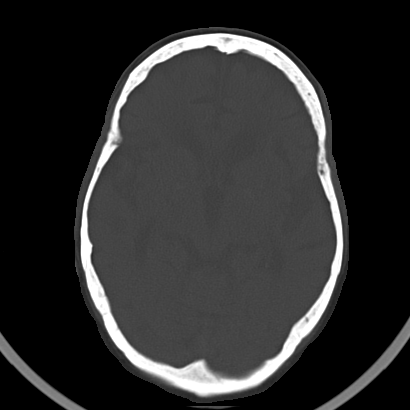
[im 21/36  bone]
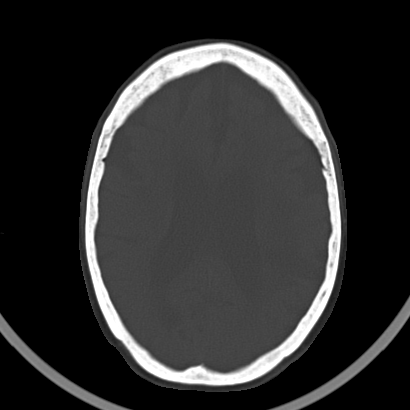
[im 23/36  bone]
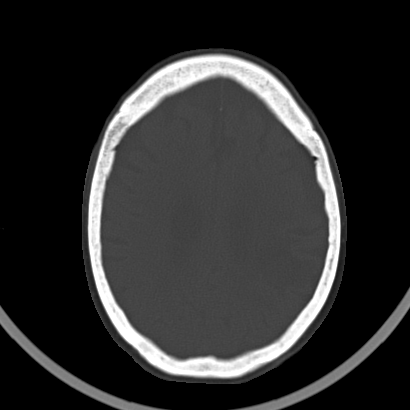
[im 28/36  bone]
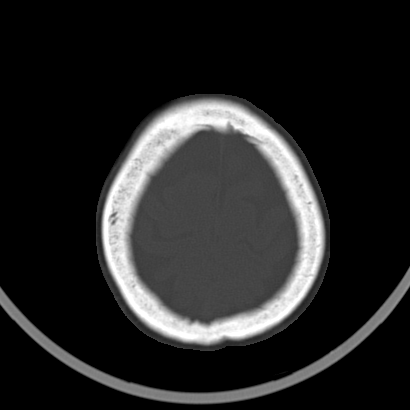

[Series 203: coronal st, idose (1) · coronal · 0.43mm/px · 3 of 67 slices shown]
[im 23/67  brain]
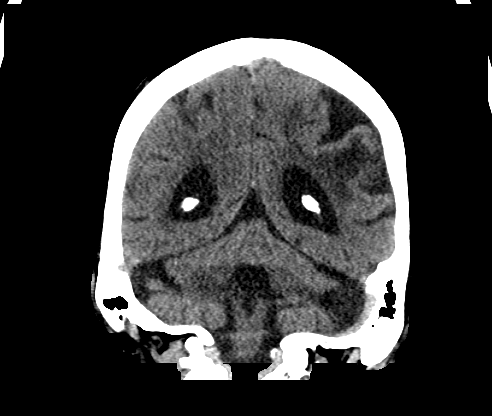
[im 30/67  brain]
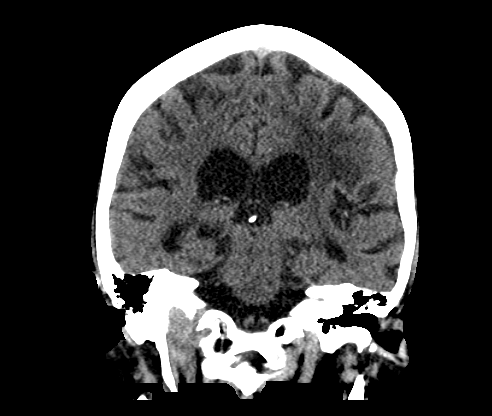
[im 37/67  brain]
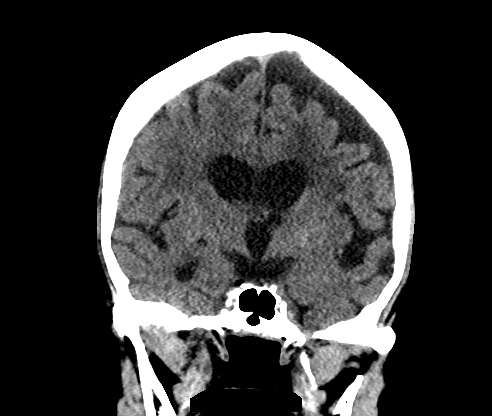

[Series 204: sagittal st, idose (1) · sagittal · 0.43mm/px · 3 of 71 slices shown]
[im 24/71  brain]
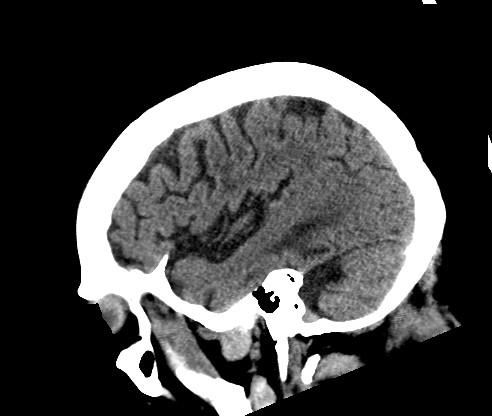
[im 36/71  brain]
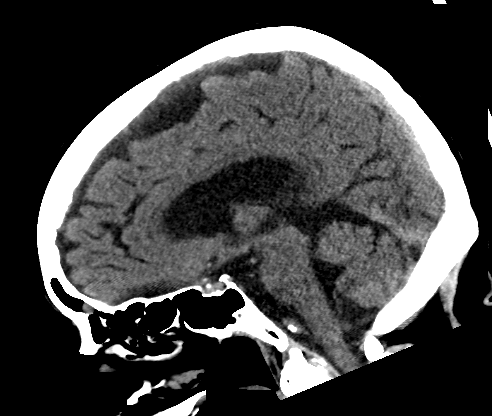
[im 47/71  brain]
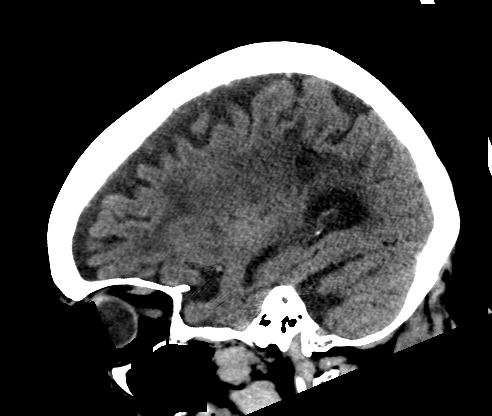

[26 of 47 positions shown; findings below may reference images not displayed]

FINDINGS: Brain: Moderate atrophy. Chronic microvascular ischemic changes in
the white matter. Chronic infarct left parietal lobe unchanged.
Small chronic infarct right lower cerebellum. No acute infarct.
Negative for hemorrhage or mass. No shift of the midline structures.
No change from the prior study.

Vascular: Negative

Skull: Negative

Sinuses/Orbits: Negative

Other: Non
IMPRESSION: Stable head CT without acute infarct or interval change.

## 2018-03-18 MED ORDER — FENTANYL 25 MCG/HR TD PT72: 25.0000 ug | MEDICATED_PATCH | TRANSDERMAL | 0 refills | Status: DC

## 2018-03-18 MED ORDER — OXYCODONE HCL 20 MG/ML PO CONC: 10.0000 mg | ORAL | 0 refills | Status: DC

## 2018-03-18 MED ORDER — OXYCODONE HCL 20 MG/ML PO CONC: ORAL | 0 refills | Status: DC

## 2018-03-18 NOTE — Telephone Encounter (Signed)
Rx sent to [facility] phone : [phone] , fax : [phone]  

## 2018-03-25 ENCOUNTER — Encounter: Payer: Self-pay | Admitting: Gerontology

## 2018-03-25 ENCOUNTER — Non-Acute Institutional Stay (SKILLED_NURSING_FACILITY): Payer: Medicare Other | Admitting: Gerontology

## 2018-03-25 DIAGNOSIS — I739 Peripheral vascular disease, unspecified: Secondary | ICD-10-CM | POA: Diagnosis not present

## 2018-03-25 DIAGNOSIS — I272 Pulmonary hypertension, unspecified: Secondary | ICD-10-CM | POA: Diagnosis not present

## 2018-03-25 DIAGNOSIS — Z515 Encounter for palliative care: Secondary | ICD-10-CM

## 2018-03-25 DIAGNOSIS — G459 Transient cerebral ischemic attack, unspecified: Secondary | ICD-10-CM | POA: Diagnosis not present

## 2018-03-26 NOTE — Assessment & Plan Note (Signed)
Stable. No known recent episodes of recurrence. On Plavix 75 mg po Q Day

## 2018-03-26 NOTE — Assessment & Plan Note (Signed)
Stable. On O2 for comfort.

## 2018-03-26 NOTE — Progress Notes (Signed)
Location:    Nursing Home Room Number: 325A Place of Service:  SNF (31) Provider:  [name], NP-C  [name], MD  Patient Care Team: [name], MD as PCP - General (Internal Medicine) [name], NP as Nurse Practitioner (Family Medicine)  Extended Emergency Contact Information Primary Emergency Contact: [name], [place] Phone: [phone] Relation: Daughter Secondary Emergency Contact: [name] Address: [address]          [place], [place] [address] [place] of [place] Home Phone: [phone] Mobile Phone: [phone] Relation: Daughter  Code Status: DNR Goals of care: Advanced Directive information Advanced Directives 03/25/2018  Does Patient Have a Medical Advance Directive? Yes  Type of Estate agent of [place];Out of facility DNR (pink MOST or yellow form)  Does patient want to make changes to medical advance directive? No - Patient declined  Copy of Healthcare Power of Attorney in Chart? Yes  Would patient like information on creating a medical advance directive? -  Pre-existing out of facility DNR order (yellow form or pink MOST form) Yellow form placed in chart (order not valid for inpatient use)     Chief Complaint  Patient presents with  . Medical Management of Chronic Issues    Routine Visit    HPI:  Pt is a 82 y.o. female seen today for medical management of chronic diseases.    PVD (peripheral vascular disease) (HCC) Severe, progressive. Right forefoot necrotic. Painful, anxiety inducing. On Hospice services. On Oxyfast 0.5 mL PO QID scheduled and Alprazolam 0.5 mg BID. Use of Betadine swabs for reduction of bioburden, odor control daily.   Pulmonary hypertension, moderate to severe Stable. On O2 for comfort.   TIA (transient ischemic attack) Stable. No known recent episodes of recurrence. On Plavix 75 mg po Q Day  Encounter for dying  care Report from Hospice RN. She is comfortable. Daughter would like all medications except those re.ated to comfort to be DC'ed and comfort meds adjusted for decreased anxiety  Please note pt with limited verbal/cognitive ability. Unable to obtain complete ROS. Some ROS info obtained from staff and documentation.      Past Medical History:  Diagnosis Date  . Anemia    "as a child" (08/05/2013)  . Anxiety   . Aphasia as late effect of stroke 04/18/2017  . Arthritis    "all over me; in all my joints" (08/05/2013)  . Atrial fibrillation (HCC)    a. Chronic; No longer on coumadin 2/2 frequent falls.  . Chest pain    a. 08/2011 Myoview: sm, fixed anteroseptal defect (attenuation), no ischemia, EF 62%.  . CHF (congestive heart failure) (HCC)   . Chronic atrial fibrillation (HCC) 04/18/2017  . Coccyx pain   . COPD (chronic obstructive pulmonary disease) (HCC)   . Dementia   . Dementia in Alzheimer's disease 04/18/2017  . Depression   . Diabetes mellitus type 2, controlled, without complications (HCC) 04/18/2017  . Falls frequently    coumadin stopped  . GERD (gastroesophageal reflux disease)   . Gout    "right foot" (08/05/2013)  . H/O hiatal hernia   . History of pneumonia    "couple times; long time ago" (08/05/2013)  . Hypercholesterolemia   . Hypertension   . Hypertrophic cardiomyopathy (HCC)    a. 11/2013 Echo: EF 55-60%, basal inf HK, sev dil LA, no evidence of HCM.  PASP .  [name] Hyponatremia   .  Hyposmolality   . Osteoarthritis   . Oxygen dependent    "3L 24/7" (08/05/2013)  . Pacemaker    a. 04/2012 MDT [id number] [employer] PPM, ser #: [id number] H.  . Pulmonary HTN (HCC)    a. has had prior right heart cath in 2010; was felt that most likely due to elevated left sided pressures and would not benefit from vasodilator therapy;  b. 11/2013 Echo: PASP .  [name] Simple chronic bronchitis (HCC) 04/18/2017  . Situational mixed anxiety and depressive disorder   . TIA (transient  ischemic attack)   . Type II diabetes mellitus (HCC)   . Varicose veins    Past Surgical History:  Procedure Laterality Date  . ABDOMINAL HYSTERECTOMY     "partial" (08/05/2013)  . APPENDECTOMY    . BACK SURGERY     "bone spur removed; mid back" (08/05/2013)  . CARDIAC CATHETERIZATION     "more than once" (08/05/2013)  . DILATION AND CURETTAGE OF UTERUS     "several" (08/05/2013)  . FOOT NEUROMA SURGERY Right   . INSERT / REPLACE / REMOVE PACEMAKER  1998; 2000's; 2014  . PACEMAKER GENERATOR CHANGE N/A 04/24/2012   Procedure: PACEMAKER GENERATOR CHANGE;  Surgeon: Duke Salvia, MD;  Location: [facility] C[facility] LAB;  Service: Cardiovascular;  Laterality: N/A;  . TONSILLECTOMY    . TOTAL KNEE ARTHROPLASTY Right 2011    Allergies  Allergen Reactions  . Aspirin Nausea And Vomiting  . Ativan [Lorazepam] Other (See Comments)    Hallucinations  . Atorvastatin     Other reaction(s): Unknown  . Calan [Verapamil Hcl] Other (See Comments)    Patient states it makes her "out of her mind"; bp bottoms out (takes diltiazem at home)  . Codeine Nausea And Vomiting  . Crestor [Rosuvastatin Calcium] Other (See Comments)    Muscle pain  . Escitalopram Oxalate Other (See Comments)    Reaction Unknown   . Lactose Intolerance (Gi) Diarrhea    Can tolerate in small amounts  . Lexapro [Escitalopram] Other (See Comments)    Unknown   . Lipitor [Atorvastatin Calcium] Other (See Comments)    myalgia  . Lopressor [Metoprolol Tartrate] Other (See Comments)    Blood pressure bottoms out   . Metoprolol Tartrate Other (See Comments)    Unknown   . Nitroglycerin Other (See Comments)    Reaction unknown  . Norvasc [Amlodipine Besylate] Other (See Comments)    fatigue  . Nsaids Nausea Only  . Oxycodone Other (See Comments)    Reaction unknown  . Percocet [Oxycodone-Acetaminophen] Nausea And Vomiting  . Prednisolone     Other reaction(s): Unknown  . Prednisone Itching and Swelling  . Rosuvastatin      Other reaction(s): Unknown  . Sulfa Antibiotics Nausea Only and Other (See Comments)    Makes her stomach hurt  . Tape Other (See Comments)    Skin tears & bruises easily (SKIN IS THIN!!)  . Verapamil     Other reaction(s): Unknown  . Vimovo [Naproxen-Esomeprazole] Itching, Nausea Only and Swelling  . Zoloft [Sertraline] Other (See Comments)    UNKNOWN   . Penicillins Hives and Rash    Has patient had a PCN reaction causing immediate rash, facial/tongue/throat swelling, SOB or lightheadedness with hypotension: Yes Has patient had a PCN reaction causing severe rash involving mucus membranes or skin necrosis: No Has patient had a PCN reaction that required hospitalization No Has patient had a PCN reaction occurring within the last 10 years: No If all of the  above answers are "NO", then may proceed with Cephalosporin use.     Allergies as of 03/25/2018      Reactions   Aspirin Nausea And Vomiting   Ativan [lorazepam] Other (See Comments)   Hallucinations   Atorvastatin    Other reaction(s): Unknown   Calan [verapamil Hcl] Other (See Comments)   Patient states it makes her "out of her mind"; bp bottoms out (takes diltiazem at home)   Codeine Nausea And Vomiting   Crestor [rosuvastatin Calcium] Other (See Comments)   Muscle pain   Escitalopram Oxalate Other (See Comments)   Reaction Unknown   Lactose Intolerance (gi) Diarrhea   Can tolerate in small amounts   Lexapro [escitalopram] Other (See Comments)   Unknown   Lipitor [atorvastatin Calcium] Other (See Comments)   myalgia   Lopressor [metoprolol Tartrate] Other (See Comments)   Blood pressure bottoms out   Metoprolol Tartrate Other (See Comments)   Unknown   Nitroglycerin Other (See Comments)   Reaction unknown   Norvasc [amlodipine Besylate] Other (See Comments)   fatigue   Nsaids Nausea Only   Oxycodone Other (See Comments)   Reaction unknown   Percocet [oxycodone-acetaminophen] Nausea And Vomiting   Prednisolone      Other reaction(s): Unknown   Prednisone Itching, Swelling   Rosuvastatin    Other reaction(s): Unknown   Sulfa Antibiotics Nausea Only, Other (See Comments)   Makes her stomach hurt   Tape Other (See Comments)   Skin tears & bruises easily (SKIN IS THIN!!)   Verapamil    Other reaction(s): Unknown   Vimovo [naproxen-esomeprazole] Itching, Nausea Only, Swelling   Zoloft [sertraline] Other (See Comments)   UNKNOWN   Penicillins Hives, Rash   Has patient had a PCN reaction causing immediate rash, facial/tongue/throat swelling, SOB or lightheadedness with hypotension: Yes Has patient had a PCN reaction causing severe rash involving mucus membranes or skin necrosis: No Has patient had a PCN reaction that required hospitalization No Has patient had a PCN reaction occurring within the last 10 years: No If all of the above answers are "NO", then may proceed with Cephalosporin use.      Medication List        Accurate as of 03/25/18 11:59 PM. Always use your most recent med list.          acetaminophen 325 MG tablet Commonly known as:  TYLENOL Take 650 mg by mouth 4 (four) times daily.   ALPRAZolam 0.25 MG tablet Commonly known as:  XANAX Take 1 tablet (0.25 mg total) by mouth 3 (three) times daily. At 9 am and 9 pm   alum & mag hydroxide-simeth 400-400-40 MG/5ML suspension Commonly known as:  MAALOX PLUS Take 30 mLs by mouth every 4 (four) hours as needed.   benzocaine 10 % mucosal gel Commonly known as:  ORAJEL Apply thin film to area of soreness on gums every 2 hours as needed   BETADINE SWABSTICKS 10 % Swab Generic drug:  Povidone-Iodine Scrub Sponge Apply 1 each topically daily. Swab necrotic toes with betadine sticks to help control bioburden.  Cover with 4x4 gauze for protection   bisacodyl 10 MG suppository Commonly known as:  DULCOLAX Place 10 mg rectally daily as needed.   clopidogrel 75 MG tablet Commonly known as:  PLAVIX Take 1 tablet (75 mg total) by  mouth daily.   DERMACLOUD Crea Apply liberal amount topically to area of skin irritation as needed. Ok to leave at bedside.   divalproex 125 MG capsule  Commonly known as:  DEPAKOTE SPRINKLE Take 125 mg by mouth 3 (three) times daily with meals. DX: Mania/mood stabilization Give with meals   famotidine 40 MG tablet Commonly known as:  PEPCID Take 40 mg by mouth daily. For use while on oxycodone   magnesium hydroxide 400 MG/5ML suspension Commonly known as:  MILK OF MAGNESIA Take 30 mLs by mouth every 4 (four) hours as needed. Constipation/ no BM for 2 days   oxybutynin 5 MG tablet Commonly known as:  DITROPAN Take 5 mg by mouth 2 (two) times daily.   oxyCODONE 20 MG/ML concentrated solution Commonly known as:  ROXICODONE INTENSOL Give 0.25 ml - 0.5 ml by mouth every 2 hours as needed for pain, dyspnea. Monitor for reaction. Per EPIC allergic reaction is GI upset.  0.25 - mild; 0.5 ml - moderate to severe   oxyCODONE 20 MG/ML concentrated solution Commonly known as:  ROXICODONE INTENSOL Take 0.5 mLs (10 mg total) by mouth every 4 (four) hours.   senna 8.6 MG Tabs tablet Commonly known as:  SENOKOT Take 1 tablet by mouth 2 (two) times daily.       Review of Systems  Unable to perform ROS: Patient nonverbal    Immunization History  Administered Date(s) Administered  . Influenza Split 09/11/2012  . Influenza Whole 10/15/2010  . Influenza, High Dose Seasonal PF 08/20/2017  . Influenza-Unspecified 08/10/2013, 02/06/2016, 08/27/2016  . PPD Test 08/24/2013, 02/06/2016, 02/13/2017  . Pneumococcal Polysaccharide-23 10/15/2010  . Pneumococcal-Unspecified 08/10/2013, 02/06/2016  . Tdap 10/01/2013   Pertinent  Health Maintenance Due  Topic Date Due  . FOOT EXAM  06/18/1946  . OPHTHALMOLOGY EXAM  06/18/1946  . URINE MICROALBUMIN  06/18/1946  . PNA vac Low Risk Adult (2 of 2 - PCV13) 02/05/2017  . HEMOGLOBIN A1C  03/17/2017  . INFLUENZA VACCINE  06/11/2018  . DEXA SCAN   Completed   Fall Risk  05/18/2013  Falls in the past year? Yes  Number falls in past yr: 1  Injury with Fall? No  Risk for fall due to : Impaired balance/gait;Impaired mobility   Functional Status Survey:    Vitals:   03/25/18 0944  BP: 132/86  Pulse: 88  Resp: 18  Temp: (!) 97.2 F (36.2 C)  TempSrc: Oral  SpO2: 96%  Weight: 98 lb 4.8 oz (44.6 kg)  Height:  (1.676 m)   Body mass index is 15.87 kg/m. Physical Exam  Constitutional: Vital signs are normal. She appears listless. She is active. She does not appear ill. No distress. Nasal cannula in place.  HENT:  Head: Normocephalic and atraumatic.  Mouth/Throat: Uvula is midline, oropharynx is clear and moist and mucous membranes are normal. Mucous membranes are not pale, not dry and not cyanotic.  Eyes: Pupils are equal, round, and reactive to light. Conjunctivae, EOM and lids are normal.  Neck: Trachea normal, normal range of motion and full passive range of motion without pain. Neck supple. No JVD present. No tracheal deviation, no edema and no erythema present. No thyromegaly present.  Cardiovascular: Normal rate, normal heart sounds and intact distal pulses. An irregular rhythm present. Exam reveals decreased pulses. Exam reveals no gallop, no distant heart sounds and no friction rub.  No murmur heard. Pulses:      Dorsalis pedis pulses are 2+ on the right side, and 2+ on the left side.  No edema  Pulmonary/Chest: Effort normal and breath sounds normal. No accessory muscle usage. No respiratory distress. She has no decreased breath sounds.  She has no wheezes. She has no rhonchi. She has no rales. She exhibits no tenderness.  Abdominal: Soft. Normal appearance and bowel sounds are normal. She exhibits no distension and no ascites. There is no tenderness.  Musculoskeletal: Normal range of motion. She exhibits no edema or tenderness.  Expected osteoarthritis, stiffness; Bilateral Calves soft, supple. Negative Homan's Sign.  B- pedal pulses equal; right forefoot necrotic  Neurological: She has normal strength. She appears listless. She is disoriented. She displays atrophy. A cranial nerve deficit and sensory deficit is present. She exhibits abnormal muscle tone. Coordination and gait abnormal.  Skin: Skin is warm, dry and intact. She is not diaphoretic. No cyanosis. No pallor. Nails show no clubbing.  Psychiatric: She has a normal mood and affect. Judgment and thought content normal. Her speech is delayed. She is slowed and withdrawn. Cognition and memory are impaired. She is noncommunicative.  Minimally responsive She is inattentive.  Nursing note and vitals reviewed.   Labs reviewed: No results for input(s): NA, K, CL, CO2, GLUCOSE, BUN, CREATININE, CALCIUM, MG, PHOS in the last 8760 hours. No results for input(s): AST, ALT, ALKPHOS, BILITOT, PROT, ALBUMIN in the last 8760 hours. No results for input(s): WBC, NEUTROABS, HGB, HCT, MCV, PLT in the last 8760 hours. Lab Results  Component Value Date   TSH 1.674 02/07/2015   Lab Results  Component Value Date   HGBA1C 5.6 09/17/2016   Lab Results  Component Value Date   CHOL 288 (H) 09/17/2016   HDL 43 09/17/2016   LDLCALC 207 (H) 09/17/2016   TRIG 190 (H) 09/17/2016   CHOLHDL 6.7 09/17/2016    Significant Diagnostic Results in last 30 days:  No results found.  Assessment/Plan Miliani was seen today for medical management of chronic issues.  Diagnoses and all orders for this visit:  PVD (peripheral vascular disease) (HCC)  Pulmonary hypertension, moderate to severe (HCC)  TIA (transient ischemic attack)  Encounter for dying care   Above listed conditions variable, progressive  Discontinue all medications except those related to comfort  Ditropan  MOM  Depakote  Plavix  APAP   Senna   Continue comfort related meds  Betadine swabs  Oxyfast 20 mg/ ml 0.5 mL po Q 4 hours ATC  Increase Alprazolam 0.25 mg po Q 4 hours ATC  Continue  pepcid 40 mg po Q Day if able to swallow  Continue oxygen- titrate for comfort  Continue daily dressing changes with Betadine  Continue Hospice services for EOL Care  Scopolamine patch 1 mg Q 3 days  Atropine 1% ophthalmic drops- 2 drops under the tongue Q 30 minutes prn secretions  Do not force pt to eat as she is likely aspirating  Family/ staff Communication:   Total Time:  Documentation:  Face to Face:  Family/Phone:   Labs/tests ordered:    Medication list reviewed and assessed for continued appropriateness. Monthly medication orders reviewed and signed.  Brynda Rim, NP-C [facility] [phone], [place] [address] Cell Phone (Mon-Fri 8am-5pm):  [phone] On Call:  [phone] & follow prompts after 5pm & weekends Office Phone:  [phone] Office Fax:  [phone]

## 2018-03-26 NOTE — Assessment & Plan Note (Signed)
Severe, progressive. Right forefoot necrotic. Painful, anxiety inducing. On Hospice services. On Oxyfast 0.5 mL PO QID scheduled and Alprazolam 0.5 mg BID. Use of Betadine swabs for reduction of bioburden, odor control daily.

## 2018-03-27 ENCOUNTER — Other Ambulatory Visit: Payer: Self-pay

## 2018-03-27 MED ORDER — OXYCODONE HCL 20 MG/ML PO CONC: 10.0000 mg | ORAL | 0 refills | Status: AC

## 2018-03-27 MED ORDER — OXYCODONE HCL 20 MG/ML PO CONC: ORAL | 0 refills | Status: AC

## 2018-03-27 MED ORDER — ALPRAZOLAM 0.25 MG PO TABS: 0.2500 mg | ORAL_TABLET | ORAL | 0 refills | Status: AC

## 2018-03-27 NOTE — Telephone Encounter (Signed)
Rx sent to [facility] phone : [phone] , fax : [phone]  

## 2018-04-11 ENCOUNTER — Encounter: Admission: RE | Admit: 2018-04-11 | Source: Ambulatory Visit | Admitting: Internal Medicine

## 2018-04-11 DEATH — deceased
